# Patient Record
Sex: Male | Born: 1950 | Race: White | Hispanic: No | Marital: Married | Smoking: Never smoker
Health system: Southern US, Community
[De-identification: ages and names within clinical notes are randomized; demographics above are authoritative.]

## PROBLEM LIST (undated history)

## (undated) DIAGNOSIS — Z9089 Acquired absence of other organs: Secondary | ICD-10-CM

## (undated) DIAGNOSIS — G4730 Sleep apnea, unspecified: Secondary | ICD-10-CM

## (undated) DIAGNOSIS — F419 Anxiety disorder, unspecified: Secondary | ICD-10-CM

## (undated) DIAGNOSIS — I10 Essential (primary) hypertension: Secondary | ICD-10-CM

## (undated) DIAGNOSIS — H9190 Unspecified hearing loss, unspecified ear: Secondary | ICD-10-CM

## (undated) DIAGNOSIS — N401 Enlarged prostate with lower urinary tract symptoms: Secondary | ICD-10-CM

## (undated) DIAGNOSIS — N508 Other specified disorders of male genital organs: Secondary | ICD-10-CM

## (undated) DIAGNOSIS — E785 Hyperlipidemia, unspecified: Secondary | ICD-10-CM

## (undated) DIAGNOSIS — M792 Neuralgia and neuritis, unspecified: Secondary | ICD-10-CM

## (undated) DIAGNOSIS — G6181 Chronic inflammatory demyelinating polyneuritis: Secondary | ICD-10-CM

## (undated) DIAGNOSIS — F528 Other sexual dysfunction not due to a substance or known physiological condition: Secondary | ICD-10-CM

## (undated) DIAGNOSIS — E1065 Type 1 diabetes mellitus with hyperglycemia: Secondary | ICD-10-CM

## (undated) DIAGNOSIS — T402X5A Adverse effect of other opioids, initial encounter: Secondary | ICD-10-CM

## (undated) DIAGNOSIS — K5903 Drug induced constipation: Secondary | ICD-10-CM

## (undated) HISTORY — PX: BACK SURGERY: SHX140

## (undated) HISTORY — PX: KNEE SURGERY: SHX244

## (undated) HISTORY — PX: TONSILLECTOMY: SUR1361

## (undated) HISTORY — DX: Other specified disorders of male genital organs: N50.8

## (undated) HISTORY — DX: Hyperlipidemia, unspecified: E78.5

## (undated) HISTORY — DX: Benign prostatic hyperplasia with lower urinary tract symptoms: N40.1

## (undated) HISTORY — DX: Acquired absence of other organs: Z90.89

## (undated) HISTORY — DX: Essential (primary) hypertension: I10

## (undated) HISTORY — DX: Other sexual dysfunction not due to a substance or known physiological condition: F52.8

## (undated) HISTORY — DX: Sleep apnea, unspecified: G47.30

## (undated) HISTORY — DX: Drug induced constipation: K59.03

## (undated) HISTORY — DX: Type 1 diabetes mellitus with hyperglycemia: E10.65

## (undated) HISTORY — PX: EYE SURGERY: SHX253

## (undated) HISTORY — DX: Adverse effect of other opioids, initial encounter: T40.2X5A

## (undated) HISTORY — PX: APPENDECTOMY: SHX54

---

## 2001-09-21 ENCOUNTER — Encounter: Admission: RE | Admit: 2001-09-21 | Discharge: 2001-11-29 | Payer: Self-pay | Admitting: Endocrinology

## 2002-02-11 ENCOUNTER — Encounter: Payer: Self-pay | Admitting: Endocrinology

## 2002-02-11 ENCOUNTER — Encounter: Admission: RE | Admit: 2002-02-11 | Discharge: 2002-02-11 | Payer: Self-pay | Admitting: Endocrinology

## 2004-02-07 ENCOUNTER — Ambulatory Visit: Payer: Self-pay | Admitting: Endocrinology

## 2004-02-14 ENCOUNTER — Ambulatory Visit: Payer: Self-pay | Admitting: Endocrinology

## 2004-03-10 ENCOUNTER — Ambulatory Visit: Payer: Self-pay | Admitting: Endocrinology

## 2004-04-08 ENCOUNTER — Ambulatory Visit: Payer: Self-pay | Admitting: Endocrinology

## 2004-04-14 ENCOUNTER — Ambulatory Visit: Payer: Self-pay | Admitting: Endocrinology

## 2004-07-15 ENCOUNTER — Ambulatory Visit: Payer: Self-pay | Admitting: Endocrinology

## 2004-07-21 ENCOUNTER — Ambulatory Visit: Payer: Self-pay | Admitting: Endocrinology

## 2004-10-21 ENCOUNTER — Ambulatory Visit: Payer: Self-pay | Admitting: Endocrinology

## 2004-10-27 ENCOUNTER — Ambulatory Visit: Payer: Self-pay | Admitting: Endocrinology

## 2004-11-19 ENCOUNTER — Ambulatory Visit: Payer: Self-pay | Admitting: Endocrinology

## 2005-02-04 ENCOUNTER — Ambulatory Visit: Payer: Self-pay | Admitting: Endocrinology

## 2005-02-10 ENCOUNTER — Ambulatory Visit: Payer: Self-pay | Admitting: Endocrinology

## 2005-02-26 ENCOUNTER — Ambulatory Visit: Payer: Self-pay | Admitting: Endocrinology

## 2005-05-01 ENCOUNTER — Ambulatory Visit: Payer: Self-pay | Admitting: Endocrinology

## 2005-05-11 ENCOUNTER — Ambulatory Visit: Payer: Self-pay | Admitting: Endocrinology

## 2005-06-12 ENCOUNTER — Ambulatory Visit: Payer: Self-pay | Admitting: Endocrinology

## 2006-02-01 ENCOUNTER — Ambulatory Visit: Payer: Self-pay | Admitting: Endocrinology

## 2006-02-01 LAB — CONVERTED CEMR LAB: Hgb A1c MFr Bld: 9.4 % — ABNORMAL HIGH (ref 4.6–6.0)

## 2006-02-11 ENCOUNTER — Ambulatory Visit: Payer: Self-pay | Admitting: Endocrinology

## 2006-05-10 ENCOUNTER — Ambulatory Visit: Payer: Self-pay | Admitting: Endocrinology

## 2006-05-10 LAB — CONVERTED CEMR LAB
ALT: 32 units/L (ref 0–40)
AST: 22 units/L (ref 0–37)
Albumin: 4.1 g/dL (ref 3.5–5.2)
Alkaline Phosphatase: 143 units/L — ABNORMAL HIGH (ref 39–117)
BUN: 14 mg/dL (ref 6–23)
Basophils Absolute: 0 10*3/uL (ref 0.0–0.1)
Basophils Relative: 0.3 % (ref 0.0–1.0)
Bilirubin Urine: NEGATIVE
Bilirubin, Direct: 0.2 mg/dL (ref 0.0–0.3)
CO2: 30 meq/L (ref 19–32)
Calcium: 9.5 mg/dL (ref 8.4–10.5)
Chloride: 109 meq/L (ref 96–112)
Cholesterol: 110 mg/dL (ref 0–200)
Creatinine, Ser: 0.8 mg/dL (ref 0.4–1.5)
Creatinine,U: 105.3 mg/dL
Eosinophils Absolute: 0.2 10*3/uL (ref 0.0–0.6)
Eosinophils Relative: 3.4 % (ref 0.0–5.0)
GFR calc Af Amer: 130 mL/min
GFR calc non Af Amer: 107 mL/min
Glucose, Bld: 112 mg/dL — ABNORMAL HIGH (ref 70–99)
HCT: 52.5 % — ABNORMAL HIGH (ref 39.0–52.0)
HDL: 35.7 mg/dL — ABNORMAL LOW (ref >39.0)
Hemoglobin: 18.1 g/dL — ABNORMAL HIGH (ref 13.0–17.0)
Hgb A1c MFr Bld: 9.8 % — ABNORMAL HIGH (ref 4.6–6.0)
Ketones, ur: NEGATIVE mg/dL
LDL Cholesterol: 50 mg/dL (ref 0–99)
Leukocytes, UA: NEGATIVE
Lymphocytes Relative: 26.8 % (ref 12.0–46.0)
MCHC: 34.5 g/dL (ref 30.0–36.0)
MCV: 88.4 fL (ref 78.0–100.0)
Microalb Creat Ratio: 2.8 mg/g (ref 0.0–30.0)
Microalb, Ur: 0.3 mg/dL (ref 0.0–1.9)
Monocytes Absolute: 0.6 10*3/uL (ref 0.2–0.7)
Monocytes Relative: 9 % (ref 3.0–11.0)
Neutro Abs: 4 10*3/uL (ref 1.4–7.7)
Neutrophils Relative %: 60.5 % (ref 43.0–77.0)
Nitrite: NEGATIVE
PSA: 2.3 ng/mL (ref 0.10–4.00)
Platelets: 218 10*3/uL (ref 150–400)
Potassium: 4 meq/L (ref 3.5–5.1)
RBC: 5.94 M/uL — ABNORMAL HIGH (ref 4.22–5.81)
RDW: 11.8 % (ref 11.5–14.6)
Sodium: 145 meq/L (ref 135–145)
Specific Gravity, Urine: 1.025 (ref 1.000–1.03)
TSH: 1.93 microintl units/mL (ref 0.35–5.50)
Total Bilirubin: 0.8 mg/dL (ref 0.3–1.2)
Total CHOL/HDL Ratio: 3.1
Total Protein, Urine: NEGATIVE mg/dL
Total Protein: 7 g/dL (ref 6.0–8.3)
Triglycerides: 124 mg/dL (ref 0–149)
Urine Glucose: 500 mg/dL — AB
Urobilinogen, UA: 0.2 (ref 0.0–1.0)
VLDL: 25 mg/dL (ref 0–40)
WBC: 6.6 10*3/uL (ref 4.5–10.5)
pH: 5.5 (ref 5.0–8.0)

## 2006-05-17 ENCOUNTER — Ambulatory Visit: Payer: Self-pay | Admitting: Endocrinology

## 2006-09-17 ENCOUNTER — Ambulatory Visit: Payer: Self-pay | Admitting: Endocrinology

## 2006-09-20 ENCOUNTER — Ambulatory Visit: Payer: Self-pay | Admitting: Endocrinology

## 2006-12-29 DIAGNOSIS — E1065 Type 1 diabetes mellitus with hyperglycemia: Secondary | ICD-10-CM

## 2006-12-29 DIAGNOSIS — IMO0002 Reserved for concepts with insufficient information to code with codable children: Secondary | ICD-10-CM

## 2006-12-29 HISTORY — DX: Reserved for concepts with insufficient information to code with codable children: IMO0002

## 2006-12-29 HISTORY — DX: Type 1 diabetes mellitus with hyperglycemia: E10.65

## 2006-12-29 LAB — CONVERTED CEMR LAB: Hgb A1c MFr Bld: 9.4 % — ABNORMAL HIGH (ref 4.6–6.0)

## 2007-02-15 ENCOUNTER — Encounter: Payer: Self-pay | Admitting: Endocrinology

## 2007-02-16 ENCOUNTER — Ambulatory Visit: Payer: Self-pay | Admitting: Endocrinology

## 2007-02-16 DIAGNOSIS — E785 Hyperlipidemia, unspecified: Secondary | ICD-10-CM

## 2007-02-16 DIAGNOSIS — N508 Other specified disorders of male genital organs: Secondary | ICD-10-CM | POA: Insufficient documentation

## 2007-02-16 DIAGNOSIS — Z9089 Acquired absence of other organs: Secondary | ICD-10-CM | POA: Insufficient documentation

## 2007-02-16 DIAGNOSIS — F411 Generalized anxiety disorder: Secondary | ICD-10-CM | POA: Insufficient documentation

## 2007-02-16 DIAGNOSIS — M25569 Pain in unspecified knee: Secondary | ICD-10-CM

## 2007-02-16 DIAGNOSIS — G8929 Other chronic pain: Secondary | ICD-10-CM | POA: Insufficient documentation

## 2007-02-16 DIAGNOSIS — I10 Essential (primary) hypertension: Secondary | ICD-10-CM | POA: Insufficient documentation

## 2007-02-16 DIAGNOSIS — F528 Other sexual dysfunction not due to a substance or known physiological condition: Secondary | ICD-10-CM

## 2007-02-16 HISTORY — DX: Other sexual dysfunction not due to a substance or known physiological condition: F52.8

## 2007-02-16 HISTORY — DX: Acquired absence of other organs: Z90.89

## 2007-02-16 HISTORY — DX: Other specified disorders of male genital organs: N50.8

## 2007-02-16 HISTORY — DX: Essential (primary) hypertension: I10

## 2007-02-16 HISTORY — DX: Hyperlipidemia, unspecified: E78.5

## 2007-03-18 ENCOUNTER — Ambulatory Visit: Payer: Self-pay | Admitting: Endocrinology

## 2007-03-20 LAB — CONVERTED CEMR LAB: Hgb A1c MFr Bld: 8.7 % — ABNORMAL HIGH (ref 4.6–6.0)

## 2007-03-21 ENCOUNTER — Encounter: Payer: Self-pay | Admitting: Endocrinology

## 2007-03-28 ENCOUNTER — Ambulatory Visit: Payer: Self-pay | Admitting: Endocrinology

## 2007-05-09 ENCOUNTER — Ambulatory Visit: Payer: Self-pay | Admitting: Endocrinology

## 2007-05-25 ENCOUNTER — Encounter: Payer: Self-pay | Admitting: Endocrinology

## 2007-06-14 ENCOUNTER — Telehealth (INDEPENDENT_AMBULATORY_CARE_PROVIDER_SITE_OTHER): Payer: Self-pay | Admitting: *Deleted

## 2007-06-16 ENCOUNTER — Telehealth: Payer: Self-pay | Admitting: Endocrinology

## 2007-06-24 ENCOUNTER — Ambulatory Visit: Payer: Self-pay | Admitting: Endocrinology

## 2007-06-26 LAB — CONVERTED CEMR LAB
ALT: 32 units/L (ref 0–53)
AST: 24 units/L (ref 0–37)
Albumin: 3.9 g/dL (ref 3.5–5.2)
Alkaline Phosphatase: 136 units/L — ABNORMAL HIGH (ref 39–117)
BUN: 16 mg/dL (ref 6–23)
Bacteria, UA: NEGATIVE
Basophils Absolute: 0 10*3/uL (ref 0.0–0.1)
Basophils Relative: 0.5 % (ref 0.0–1.0)
Bilirubin Urine: NEGATIVE
Bilirubin, Direct: 0.1 mg/dL (ref 0.0–0.3)
CO2: 29 meq/L (ref 19–32)
Calcium: 9 mg/dL (ref 8.4–10.5)
Chloride: 110 meq/L (ref 96–112)
Cholesterol: 105 mg/dL (ref 0–200)
Creatinine, Ser: 0.9 mg/dL (ref 0.4–1.5)
Creatinine,U: 214.8 mg/dL
Crystals: NEGATIVE
Eosinophils Absolute: 0.2 10*3/uL (ref 0.0–0.7)
Eosinophils Relative: 3.6 % (ref 0.0–5.0)
GFR calc Af Amer: 113 mL/min
GFR calc non Af Amer: 93 mL/min
Glucose, Bld: 101 mg/dL — ABNORMAL HIGH (ref 70–99)
HCT: 49.4 % (ref 39.0–52.0)
HDL: 25.9 mg/dL — ABNORMAL LOW (ref >39.0)
Hemoglobin: 16.7 g/dL (ref 13.0–17.0)
Hgb A1c MFr Bld: 8.5 % — ABNORMAL HIGH (ref 4.6–6.0)
Ketones, ur: NEGATIVE mg/dL
LDL Cholesterol: 67 mg/dL (ref 0–99)
Leukocytes, UA: NEGATIVE
Lymphocytes Relative: 26.9 % (ref 12.0–46.0)
MCHC: 33.9 g/dL (ref 30.0–36.0)
MCV: 91.1 fL (ref 78.0–100.0)
Microalb Creat Ratio: 10.7 mg/g (ref 0.0–30.0)
Microalb, Ur: 2.3 mg/dL — ABNORMAL HIGH (ref 0.0–1.9)
Monocytes Absolute: 0.5 10*3/uL (ref 0.1–1.0)
Monocytes Relative: 9.4 % (ref 3.0–12.0)
Neutro Abs: 3.5 10*3/uL (ref 1.4–7.7)
Neutrophils Relative %: 59.6 % (ref 43.0–77.0)
Nitrite: NEGATIVE
PSA: 2.33 ng/mL (ref 0.10–4.00)
Platelets: 183 10*3/uL (ref 150–400)
Potassium: 4.1 meq/L (ref 3.5–5.1)
RBC: 5.42 M/uL (ref 4.22–5.81)
RDW: 12.1 % (ref 11.5–14.6)
Sodium: 142 meq/L (ref 135–145)
Specific Gravity, Urine: 1.025 (ref 1.000–1.03)
Squamous Epithelial / HPF: NEGATIVE /lpf
TSH: 1.07 microintl units/mL (ref 0.35–5.50)
Total Bilirubin: 0.8 mg/dL (ref 0.3–1.2)
Total CHOL/HDL Ratio: 4.1
Total Protein, Urine: NEGATIVE mg/dL
Total Protein: 6.9 g/dL (ref 6.0–8.3)
Triglycerides: 60 mg/dL (ref 0–149)
Urine Glucose: 250 mg/dL — AB
Urobilinogen, UA: 1 (ref 0.0–1.0)
VLDL: 12 mg/dL (ref 0–40)
WBC, UA: NONE SEEN cells/hpf
WBC: 5.8 10*3/uL (ref 4.5–10.5)
pH: 5.5 (ref 5.0–8.0)

## 2007-07-04 ENCOUNTER — Ambulatory Visit: Payer: Self-pay | Admitting: Endocrinology

## 2007-07-04 DIAGNOSIS — M25529 Pain in unspecified elbow: Secondary | ICD-10-CM | POA: Insufficient documentation

## 2007-07-08 ENCOUNTER — Encounter: Admission: RE | Admit: 2007-07-08 | Discharge: 2007-07-08 | Payer: Self-pay | Admitting: Orthopedic Surgery

## 2007-07-18 ENCOUNTER — Telehealth: Payer: Self-pay | Admitting: Endocrinology

## 2007-07-20 ENCOUNTER — Telehealth: Payer: Self-pay | Admitting: Endocrinology

## 2007-07-22 ENCOUNTER — Telehealth (INDEPENDENT_AMBULATORY_CARE_PROVIDER_SITE_OTHER): Payer: Self-pay | Admitting: *Deleted

## 2007-07-22 ENCOUNTER — Telehealth: Payer: Self-pay | Admitting: Endocrinology

## 2007-07-22 ENCOUNTER — Encounter: Payer: Self-pay | Admitting: Internal Medicine

## 2007-11-22 ENCOUNTER — Encounter: Payer: Self-pay | Admitting: Endocrinology

## 2008-02-13 ENCOUNTER — Ambulatory Visit: Payer: Self-pay | Admitting: Endocrinology

## 2008-02-13 LAB — CONVERTED CEMR LAB
AST: 34 units/L (ref 0–37)
Basophils Absolute: 0 10*3/uL (ref 0.0–0.1)
Bilirubin, Direct: 0.2 mg/dL (ref 0.0–0.3)
CO2: 29 meq/L (ref 19–32)
Chloride: 108 meq/L (ref 96–112)
Cholesterol: 106 mg/dL (ref 0–200)
Creatinine, Ser: 0.9 mg/dL (ref 0.4–1.5)
Creatinine,U: 132 mg/dL
Eosinophils Absolute: 0.2 10*3/uL (ref 0.0–0.7)
GFR calc non Af Amer: 93 mL/min
Hgb A1c MFr Bld: 8.6 % — ABNORMAL HIGH (ref 4.6–6.0)
Ketones, ur: NEGATIVE mg/dL
LDL Cholesterol: 61 mg/dL (ref 0–99)
Leukocytes, UA: NEGATIVE
Lymphocytes Relative: 23.3 % (ref 12.0–46.0)
MCHC: 34.4 g/dL (ref 30.0–36.0)
MCV: 89.9 fL (ref 78.0–100.0)
Mucus, UA: NEGATIVE
Neutrophils Relative %: 65.1 % (ref 43.0–77.0)
PSA: 2.53 ng/mL (ref 0.10–4.00)
Platelets: 186 10*3/uL (ref 150–400)
Potassium: 4.2 meq/L (ref 3.5–5.1)
RBC: 5.43 M/uL (ref 4.22–5.81)
Sodium: 142 meq/L (ref 135–145)
Specific Gravity, Urine: 1.015 (ref 1.000–1.03)
Squamous Epithelial / LPF: NEGATIVE /lpf
TSH: 1.75 microintl units/mL (ref 0.35–5.50)
Total Bilirubin: 0.9 mg/dL (ref 0.3–1.2)
Total CHOL/HDL Ratio: 3
Triglycerides: 49 mg/dL (ref 0–149)
Urine Glucose: NEGATIVE mg/dL
Urobilinogen, UA: 0.2 (ref 0.0–1.0)
VLDL: 10 mg/dL (ref 0–40)
WBC, UA: NONE SEEN cells/hpf

## 2008-02-21 ENCOUNTER — Ambulatory Visit: Payer: Self-pay | Admitting: Endocrinology

## 2008-03-19 ENCOUNTER — Encounter: Payer: Self-pay | Admitting: Endocrinology

## 2008-03-24 ENCOUNTER — Encounter: Payer: Self-pay | Admitting: Endocrinology

## 2008-03-27 ENCOUNTER — Ambulatory Visit: Payer: Self-pay | Admitting: Endocrinology

## 2008-04-02 ENCOUNTER — Telehealth: Payer: Self-pay | Admitting: Gastroenterology

## 2008-04-04 ENCOUNTER — Encounter: Payer: Self-pay | Admitting: Gastroenterology

## 2008-05-11 ENCOUNTER — Ambulatory Visit: Payer: Self-pay | Admitting: Gastroenterology

## 2008-05-11 ENCOUNTER — Telehealth: Payer: Self-pay | Admitting: Gastroenterology

## 2008-05-15 ENCOUNTER — Encounter: Payer: Self-pay | Admitting: Gastroenterology

## 2008-05-25 ENCOUNTER — Telehealth: Payer: Self-pay | Admitting: Gastroenterology

## 2008-05-25 DIAGNOSIS — K921 Melena: Secondary | ICD-10-CM | POA: Insufficient documentation

## 2008-07-25 ENCOUNTER — Ambulatory Visit: Payer: Self-pay | Admitting: Gastroenterology

## 2008-08-22 ENCOUNTER — Ambulatory Visit: Payer: Self-pay | Admitting: Endocrinology

## 2008-08-22 LAB — CONVERTED CEMR LAB: Hgb A1c MFr Bld: 8.8 % — ABNORMAL HIGH (ref 4.6–6.5)

## 2008-09-24 ENCOUNTER — Telehealth: Payer: Self-pay | Admitting: Endocrinology

## 2008-10-23 ENCOUNTER — Encounter: Payer: Self-pay | Admitting: Endocrinology

## 2008-11-26 ENCOUNTER — Ambulatory Visit: Payer: Self-pay | Admitting: Endocrinology

## 2008-11-28 ENCOUNTER — Telehealth: Payer: Self-pay | Admitting: Endocrinology

## 2008-11-30 LAB — CONVERTED CEMR LAB: Microalb Creat Ratio: 7.9 mg/g (ref 0.0–30.0)

## 2009-01-18 ENCOUNTER — Telehealth (INDEPENDENT_AMBULATORY_CARE_PROVIDER_SITE_OTHER): Payer: Self-pay | Admitting: *Deleted

## 2009-01-19 ENCOUNTER — Encounter: Payer: Self-pay | Admitting: Endocrinology

## 2009-02-04 ENCOUNTER — Telehealth: Payer: Self-pay | Admitting: Endocrinology

## 2009-02-05 ENCOUNTER — Encounter: Payer: Self-pay | Admitting: Endocrinology

## 2009-03-15 ENCOUNTER — Telehealth (INDEPENDENT_AMBULATORY_CARE_PROVIDER_SITE_OTHER): Payer: Self-pay | Admitting: *Deleted

## 2009-04-01 ENCOUNTER — Encounter: Payer: Self-pay | Admitting: Endocrinology

## 2009-04-15 ENCOUNTER — Encounter: Payer: Self-pay | Admitting: Endocrinology

## 2009-04-22 ENCOUNTER — Encounter: Admission: RE | Admit: 2009-04-22 | Discharge: 2009-04-22 | Payer: Self-pay | Admitting: Orthopedic Surgery

## 2009-04-25 ENCOUNTER — Ambulatory Visit: Payer: Self-pay | Admitting: Endocrinology

## 2009-07-04 ENCOUNTER — Encounter (INDEPENDENT_AMBULATORY_CARE_PROVIDER_SITE_OTHER): Payer: Self-pay | Admitting: *Deleted

## 2009-10-25 ENCOUNTER — Encounter: Payer: Self-pay | Admitting: Endocrinology

## 2009-11-11 ENCOUNTER — Telehealth: Payer: Self-pay | Admitting: Endocrinology

## 2009-12-11 ENCOUNTER — Ambulatory Visit: Payer: Self-pay | Admitting: Endocrinology

## 2009-12-11 ENCOUNTER — Encounter (INDEPENDENT_AMBULATORY_CARE_PROVIDER_SITE_OTHER): Payer: Self-pay | Admitting: *Deleted

## 2009-12-11 ENCOUNTER — Encounter: Payer: Self-pay | Admitting: Endocrinology

## 2009-12-11 LAB — CONVERTED CEMR LAB
AST: 26 units/L (ref 0–37)
Alkaline Phosphatase: 114 units/L (ref 39–117)
BUN: 13 mg/dL (ref 6–23)
Basophils Absolute: 0 10*3/uL (ref 0.0–0.1)
Bilirubin, Direct: 0.2 mg/dL (ref 0.0–0.3)
Calcium: 9.2 mg/dL (ref 8.4–10.5)
Cholesterol: 108 mg/dL (ref 0–200)
Creatinine,U: 59 mg/dL
GFR calc non Af Amer: 101.37 mL/min (ref >60)
Glucose, Bld: 92 mg/dL (ref 70–99)
HDL: 30.5 mg/dL — ABNORMAL LOW (ref >39.00)
LDL Cholesterol: 64 mg/dL (ref 0–99)
Lymphocytes Relative: 31.1 % (ref 12.0–46.0)
Microalb, Ur: 1.1 mg/dL (ref 0.0–1.9)
Monocytes Relative: 9.9 % (ref 3.0–12.0)
Platelets: 188 10*3/uL (ref 150.0–400.0)
RDW: 12.9 % (ref 11.5–14.6)
Sodium: 138 meq/L (ref 135–145)
Total Bilirubin: 0.9 mg/dL (ref 0.3–1.2)
Urine Glucose: 500 mg/dL
Urobilinogen, UA: 0.2 (ref 0.0–1.0)
VLDL: 13.6 mg/dL (ref 0.0–40.0)

## 2009-12-12 ENCOUNTER — Encounter: Payer: Self-pay | Admitting: Endocrinology

## 2009-12-22 ENCOUNTER — Encounter: Payer: Self-pay | Admitting: Endocrinology

## 2009-12-29 ENCOUNTER — Encounter: Payer: Self-pay | Admitting: Endocrinology

## 2010-01-18 ENCOUNTER — Encounter: Payer: Self-pay | Admitting: Endocrinology

## 2010-01-22 ENCOUNTER — Encounter (INDEPENDENT_AMBULATORY_CARE_PROVIDER_SITE_OTHER): Payer: Self-pay | Admitting: *Deleted

## 2010-01-22 ENCOUNTER — Telehealth (INDEPENDENT_AMBULATORY_CARE_PROVIDER_SITE_OTHER): Payer: Self-pay | Admitting: *Deleted

## 2010-01-27 ENCOUNTER — Ambulatory Visit: Payer: Self-pay | Admitting: Gastroenterology

## 2010-01-27 ENCOUNTER — Encounter: Payer: Self-pay | Admitting: Endocrinology

## 2010-01-27 ENCOUNTER — Encounter (INDEPENDENT_AMBULATORY_CARE_PROVIDER_SITE_OTHER): Payer: Self-pay | Admitting: *Deleted

## 2010-02-03 ENCOUNTER — Ambulatory Visit: Payer: Self-pay | Admitting: Endocrinology

## 2010-02-03 DIAGNOSIS — N400 Enlarged prostate without lower urinary tract symptoms: Secondary | ICD-10-CM | POA: Insufficient documentation

## 2010-02-03 DIAGNOSIS — N138 Other obstructive and reflux uropathy: Secondary | ICD-10-CM

## 2010-02-03 DIAGNOSIS — N401 Enlarged prostate with lower urinary tract symptoms: Secondary | ICD-10-CM

## 2010-02-03 HISTORY — DX: Other obstructive and reflux uropathy: N13.8

## 2010-02-03 HISTORY — DX: Benign prostatic hyperplasia with lower urinary tract symptoms: N40.1

## 2010-02-05 ENCOUNTER — Ambulatory Visit: Payer: Self-pay | Admitting: Gastroenterology

## 2010-02-05 HISTORY — PX: COLONOSCOPY: SHX174

## 2010-02-06 ENCOUNTER — Encounter (INDEPENDENT_AMBULATORY_CARE_PROVIDER_SITE_OTHER): Payer: Self-pay | Admitting: *Deleted

## 2010-02-14 ENCOUNTER — Encounter: Payer: Self-pay | Admitting: Endocrinology

## 2010-03-20 NOTE — Letter (Signed)
Summary: Diabetic Instructions  [facility]  [address] [place], [place] [address]   Phone: [phone]  Fax: [phone]    JONTAVIOUS COMMONS 08-23-1950 MRN: [id number]           _  _   INSULIN PUMP MEDICATION INSTRUCTIONS  We will contact the physician managing your diabetic care for written dosage instructions for the day before your procedure and the day of your procedure.  Once we have received the instructions, we will contact you.

## 2010-03-20 NOTE — Assessment & Plan Note (Signed)
Summary: CPX/ NEEDS TO DO LABS SAME DAY /NWS  #   Vital Signs:  Patient profile:   60 year old male Height:      72 inches (182.88 cm) Weight:      227.50 pounds (103.41 kg) BMI:     30.97 O2 Sat:      97 % on Room air Temp:     97.6 degrees F (36.44 degrees C) oral Pulse rate:   72 / minute BP sitting:   122 / 74  (left arm) Cuff size:   large  Vitals Entered By: Brenton Grills MA (December 11, 2009 8:05 AM)  O2 Flow:  Room air CC: Physical/refill on meds/aj Is Patient Diabetic? Yes   CC:  Physical/refill on meds/aj.  History of Present Illness: here for regular wellness examination.  He's feeling pretty well in general, and does not drink or smoke.   Current Medications (verified): 1)  Buspirone Hcl 15 Mg Tabs (Buspirone Hcl) .... Take 1 By Mouth Two Times A Day Qd 2)  Humalog 100 Unit/ml Soln (Insulin Lispro (Human)) .... For Use in Pump, 80 Units A Day 3)  Zetia 10 Mg Tabs (Ezetimibe) .... Take 1 By Mouth Qd 4)  Crestor 40 Mg  Tabs (Rosuvastatin Calcium) .... Take 1 By Mouth Qd 5)  Onetouch Ultra Test   Strp (Glucose Blood) .... 5/day, Lancets Also 6)  Sof-Sensor   Misc (Insulin Infusion Pump Supplies) .... Change 2/week 7)  Flomax 0.4 Mg  Cp24 (Tamsulosin Hcl) .... Take 1 By Mouth Qd 8)  Zestoretic 10-12.5 Mg  Tabs (Lisinopril-Hydrochlorothiazide) .Marland Kitchen.. 1 Qd 9)  Cialis 20 Mg Tabs (Tadalafil) .... As Needed Use 10)  Accu-Chek Multiclix Lancets  Misc (Lancets) .... 5/day 250.01  Allergies (verified): No Known Drug Allergies  Family History: Reviewed history from 07/04/2007 and no changes required. no cancer.  Social History: Reviewed history from 07/04/2007 and no changes required. married works [employer].  Review of Systems  The patient denies fever, weight loss, weight gain, vision loss, decreased hearing, chest pain, syncope, dyspnea on exertion, prolonged cough, headaches, abdominal pain, melena, hematochezia, severe indigestion/heartburn, hematuria,  suspicious skin lesions, and depression.    Physical Exam  General:  normal appearance.   Head:  head: no deformity eyes: no periorbital swelling, no proptosis external nose and ears are normal mouth: no lesion seen Neck:  Supple without thyroid enlargement or tenderness.  Heart:  Regular rate and rhythm without murmurs or gallops noted. Normal S1,S2.   Abdomen:  abdomen is soft, nontender.  no hepatosplenomegaly.   not distended.  no hernia  Rectal:  normal external and internal exam.  heme neg  Prostate:  Normal size prostate without masses or tenderness.  Msk:  muscle bulk and strength are grossly normal.  no obvious joint swelling.  gait is normal and steady  Extremities:  no deformity.  no ulcer on the feet.  feet are of normal color and temp.  no edema ther are healed abrasions on the legs. mycotic toenails.   Neurologic:  cn 2-12 grossly intact.   readily moves all 4's.    Skin:  normal texture and temp.  no rash.  not diaphoretic  Cervical Nodes:  No significant adenopathy.  Psych:  Alert and cooperative; normal mood and affect; normal attention span and concentration.   Additional Exam:  SEPARATE EVALUATION FOLLOWS--EACH PROBLEM HERE IS NEW, NOT RESPONDING TO TREATMENT, OR POSES SIGNIFICANT RISK TO THE PATIENT'S HEALTH: HISTORY OF THE PRESENT ILLNESS: pt states he  feels well in general.  i have reviewed cgm report from the internet.  it is extremely variable on weekdays (when he travels), but is well-controlled on weekends.  cgm just stopped working. PAST MEDICAL HISTORY reviewed and up to date today REVIEW OF SYSTEMS: PHYSICAL EXAMINATION: dorsalis pedis intact bilat.  no carotid bruit clear to auscultation.  no respiratory distress sensation is intact to touch on the feet LAB/XRAY RESULTS: a1c is noted IMPRESSION: dm, needs increased rx PLAN: see instruction sheet   Impression & Recommendations:  Problem # 1:  ROUTINE GENERAL MEDICAL EXAM@HEALTH  CARE FACL  (ICD-V70.0) (update:  we discussed code status.  pt requests full code, but would not want to be started or maintained on artificial life-support measures if there was not a reasonable chance of recovery)  Other Orders: EKG w/ Interpretation (93000) TLB-Lipid Panel (80061-LIPID) TLB-BMP (Basic Metabolic Panel-BMET) (80048-METABOL) TLB-CBC Platelet - w/Differential (85025-CBCD) TLB-Hepatic/Liver Function Pnl (80076-HEPATIC) TLB-TSH (Thyroid Stimulating Hormone) (84443-TSH) TLB-A1C / Hgb A1C (Glycohemoglobin) (83036-A1C) TLB-PSA (Prostate Specific Antigen) (84153-PSA) TLB-Microalbumin/Creat Ratio, Urine (82043-MALB) TLB-Udip w/ Micro (81001-URINE) Est. Patient Level III (60630) Est. Patient 40-64 years ([address]010)  Patient Instructions: 1)  pending the test results, please continue the same medications for now. 2)  continue correction bolus (which some people call "sensitivity," or "insulin sensitivity ratio," or just "isr") of 1 unit for each 50 by which your glucose exceeds 100. 3)  tests are being ordered for you today.  a few days after the test(s), please call [phone] to hear your test results.   4)  Please schedule an appointment in 3 months, with labs day of visit. 5)  continue basal rate of 1.5 units/hr, except increase to 2 units/hr, 3 am-6 am. 6)  continue bolus of 1 unit/4 grams carbohydrate, except add 2 units to breakfast, subtract 8 from lunch, and add 8 to supper. 7)  i'll do the form for a replacement monitor. 8)  good diet and exercise habits significanly improve the control of your diabetes.  please let me know if you wish to be referred to a dietician.  high blood sugar is very risky to your health.  you should see an eye doctor every year. 9)  controlling your blood pressure and cholesterol drastically reduces the damage diabetes does to your body.  this also applies to quitting smoking.  please discuss these with your doctor.  you should take an aspirin every day, unless you  have been advised by a doctor not to. 10)  please consider these measures for your health:  minimize alcohol.  do not use tobacco products.  have a colonoscopy at least every 10 years from age 71.  keep firearms safely stored.  always use seat belts.  have working smoke alarms in your home.  see an eye doctor and dentist regularly.  never drive under the influence of alcohol or drugs (including prescription drugs).  those with fair skin should take precautions against the sun. 11)  please let me know what your wishes would be, if artificial life support measures should become necessary.  it is critically important to prevent falling down (keep floor areas well-lit, dry, and free of loose objects) Prescriptions: ACCU-CHEK MULTICLIX LANCETS  MISC (LANCETS) 5/day 250.01  #450 x 3   Entered and Authorized by:   Minus Breeding MD   Signed by:   Minus Breeding MD on 12/11/2009   Method used:   Print then Give to Patient   RxID:   [id number] CIALIS 20 MG  TABS (TADALAFIL) as needed use  #12 x 3   Entered and Authorized by:   Minus Breeding MD   Signed by:   Minus Breeding MD on 12/11/2009   Method used:   Print then Give to Patient   RxID:   [id number] ZESTORETIC 10-12.5 MG  TABS (LISINOPRIL-HYDROCHLOROTHIAZIDE) 1 qd  #90 x 3   Entered and Authorized by:   Minus Breeding MD   Signed by:   Minus Breeding MD on 12/11/2009   Method used:   Print then Give to Patient   RxID:   [id number] FLOMAX 0.4 MG  CP24 (TAMSULOSIN HCL) TAKE 1 by mouth QD  #90 x 3   Entered and Authorized by:   Minus Breeding MD   Signed by:   Minus Breeding MD on 12/11/2009   Method used:   Print then Give to Patient   RxID:   [id number] SOF-SENSOR   MISC (INSULIN INFUSION PUMP SUPPLIES) change 2/week  #25 x 3   Entered and Authorized by:   Minus Breeding MD   Signed by:   Minus Breeding MD on 12/11/2009   Method used:   Print then Give to Patient   RxID:   [id number] ONETOUCH ULTRA TEST   STRP  (GLUCOSE BLOOD) 5/day, lancets also  #500 x 3   Entered and Authorized by:   Minus Breeding MD   Signed by:   Minus Breeding MD on 12/11/2009   Method used:   Print then Give to Patient   RxID:   [id number] CRESTOR 40 MG  TABS (ROSUVASTATIN CALCIUM) TAKE 1 by mouth QD  #90 x 3   Entered and Authorized by:   Minus Breeding MD   Signed by:   Minus Breeding MD on 12/11/2009   Method used:   Print then Give to Patient   RxID:   [id number] ZETIA 10 MG TABS (EZETIMIBE) take 1 by mouth qd  #90 x 3   Entered and Authorized by:   Minus Breeding MD   Signed by:   Minus Breeding MD on 12/11/2009   Method used:   Print then Give to Patient   RxID:   [id number] HUMALOG 100 UNIT/ML SOLN (INSULIN LISPRO (HUMAN)) for use in pump, 80 units a day  #8 vials x 3   Entered and Authorized by:   Minus Breeding MD   Signed by:   Minus Breeding MD on 12/11/2009   Method used:   Print then Give to Patient   RxID:   [id number] BUSPIRONE HCL 15 MG TABS (BUSPIRONE HCL) TAKE 1 by mouth two times a day QD  #180 x 3   Entered and Authorized by:   Minus Breeding MD   Signed by:   Minus Breeding MD on 12/11/2009   Method used:   Print then Give to Patient   RxID:   [id number] ACCU-CHEK MULTICLIX LANCETS  MISC (LANCETS) 5/day 250.01  #450 x 3   Entered and Authorized by:   Minus Breeding MD   Signed by:   Minus Breeding MD on 12/11/2009   Method used:   Print then Give to Patient   RxID:   [id number] SOF-SENSOR   MISC (INSULIN INFUSION PUMP SUPPLIES) change 2/week  #25 x 3   Entered and Authorized by:   Minus Breeding MD   Signed by:   Minus Breeding MD on 12/11/2009   Method  used:   Print then Give to Patient   RxID:   [id number] FLOMAX 0.4 MG  CP24 (TAMSULOSIN HCL) TAKE 1 by mouth QD  #90 x 3   Entered and Authorized by:   Minus Breeding MD   Signed by:   Minus Breeding MD on 12/11/2009   Method used:   Print then Give to Patient   RxID:   [id number] ZESTORETIC  10-12.5 MG  TABS (LISINOPRIL-HYDROCHLOROTHIAZIDE) 1 qd  #90 x 3   Entered and Authorized by:   Minus Breeding MD   Signed by:   Minus Breeding MD on 12/11/2009   Method used:   Print then Give to Patient   RxID:   [id number] CIALIS 20 MG TABS (TADALAFIL) as needed use  #12 x 3   Entered and Authorized by:   Minus Breeding MD   Signed by:   Minus Breeding MD on 12/11/2009   Method used:   Print then Give to Patient   RxID:   [id number] ONETOUCH ULTRA TEST   STRP (GLUCOSE BLOOD) 5/day, lancets also  #500 x 3   Entered and Authorized by:   Minus Breeding MD   Signed by:   Minus Breeding MD on 12/11/2009   Method used:   Print then Give to Patient   RxID:   [id number] CRESTOR 40 MG  TABS (ROSUVASTATIN CALCIUM) TAKE 1 by mouth QD  #90 x 3   Entered and Authorized by:   Minus Breeding MD   Signed by:   Minus Breeding MD on 12/11/2009   Method used:   Print then Give to Patient   RxID:   [id number] ZETIA 10 MG TABS (EZETIMIBE) take 1 by mouth qd  #90 x 3   Entered and Authorized by:   Minus Breeding MD   Signed by:   Minus Breeding MD on 12/11/2009   Method used:   Print then Give to Patient   RxID:   [id number] HUMALOG 100 UNIT/ML SOLN (INSULIN LISPRO (HUMAN)) for use in pump, 80 units a day  #8 vials x 3   Entered and Authorized by:   Minus Breeding MD   Signed by:   Minus Breeding MD on 12/11/2009   Method used:   Print then Give to Patient   RxID:   [id number] BUSPIRONE HCL 15 MG TABS (BUSPIRONE HCL) TAKE 1 by mouth two times a day QD  #180 x 3   Entered and Authorized by:   Minus Breeding MD   Signed by:   Minus Breeding MD on 12/11/2009   Method used:   Print then Give to Patient   RxID:   [id number]    Orders Added: 1)  EKG w/ Interpretation [93000] 2)  TLB-Lipid Panel [80061-LIPID] 3)  TLB-BMP (Basic Metabolic Panel-BMET) [80048-METABOL] 4)  TLB-CBC Platelet - w/Differential [85025-CBCD] 5)  TLB-Hepatic/Liver Function Pnl  [80076-HEPATIC] 6)  TLB-TSH (Thyroid Stimulating Hormone) [84443-TSH] 7)  TLB-A1C / Hgb A1C (Glycohemoglobin) [83036-A1C] 8)  TLB-PSA (Prostate Specific Antigen) [84153-PSA] 9)  TLB-Microalbumin/Creat Ratio, Urine [82043-MALB] 10)  TLB-Udip w/ Micro [81001-URINE] 11)  Est. Patient Level III [09381] 12)  Est. Patient 40-64 years [82993]

## 2010-03-20 NOTE — Progress Notes (Signed)
Summary: Need for double prep  Phone Note Call from Patient   Summary of Call: Dr. Jarold Motto,  pt had colon 07/25/2008 with poor prep.  The procedure report says that pt will need 2 day double prep.  Could you please advise as to what you would  like pt to use? Initial call taken by: Ezra Sites RN,  January 22, 2010 11:49 AM  Follow-up for Phone Call        BOTTLE OF MAGNESIUM CITRARTE  NIGHT BEFORE START OF 24H CLEAR LIQUIDS AND STANDARD SPLIT DOSE MOVIE PREP... Follow-up by: Mardella Layman MD [facility],  January 22, 2010 11:53 AM  Additional Follow-up for Phone Call Additional follow up Details #1::        Pt will come for [facility] 01/27/2010.  Will be given instructions for bottle of Magnesium Citrate night before start of 24 hour clear liquids and standard split does of MoviPrep. Additional Follow-up by: Ezra Sites RN,  January 22, 2010 12:00 PM

## 2010-03-20 NOTE — Letter (Signed)
Summary: [facility]   Imported By: Sherian Rein 02/26/2010 11:36:37  _____________________________________________________________________  External Attachment:    Type:   Image     Comment:   External Document

## 2010-03-20 NOTE — Progress Notes (Signed)
Summary: CPX due  Phone Note Outgoing Call Call back at [phone]   Call placed by: Brenton Grills MA,  November 11, 2009 4:00 PM Call placed to: Patient Summary of Call: Per MD, pt is due for a CPX. Pt states that he will callback on 9/27  to schedule an appointment.      Brenton Grills MA  November 11, 2009 4:01 PM

## 2010-03-20 NOTE — Letter (Signed)
Summary: Pre Visit Letter Revised  [facility]  [address] [place], [place] [address]   Phone: [phone]  Fax: [phone]        12/11/2009 MRN: [id number] Kenneth Pace [address] [place], [place]  [address]             Procedure Date:  02-05-10   Welcome to the Gastroenterology Division at [facility].    You are scheduled to see a nurse for your pre-procedure visit on 01-27-10 at 8:00a.m. on the 3rd floor at [facility], [address]  We ask that you try to arrive at our office 15 minutes prior to your appointment time to allow for check-in.  Please take a minute to review the attached form.  If you answer "Yes" to one or more of the questions on the first page, we ask that you call the person listed at your earliest opportunity.  If you answer "No" to all of the questions, please complete the rest of the form and bring it to your appointment.    Your nurse visit will consist of discussing your medical and surgical history, your immediate family medical history, and your medications.   If you are unable to list all of your medications on the form, please bring the medication bottles to your appointment and we will list them.  We will need to be aware of both prescribed and over the counter drugs.  We will need to know exact dosage information as well.    Please be prepared to read and sign documents such as consent forms, a financial agreement, and acknowledgement forms.  If necessary, and with your consent, a friend or relative is welcome to sit-in on the nurse visit with you.  Please bring your insurance card so that we may make a copy of it.  If your insurance requires a referral to see a specialist, please bring your referral form from your primary care physician.  No co-pay is required for this nurse visit.     If you cannot keep your appointment, please call [phone] to cancel or reschedule prior to your appointment date.  This  allows [place] the opportunity to schedule an appointment for another patient in need of care.    Thank you for choosing  Gastroenterology for your medical needs.  We appreciate the opportunity to care for you.  Please visit [place] at our website  to learn more about our practice.  Sincerely, The Gastroenterology Division

## 2010-03-20 NOTE — Letter (Signed)
Summary: Insulin pump letter-Colonoscopy  [facility]  [address] [place], [place] [address]   Phone: [phone]  Fax: [phone]      Date: January 27, 2010  Re: Kenneth Pace DOB: [birth year] MRN: [id number]     Dear Dr. Everardo All   Dr. Jarold Motto has scheduled the above patient for a colonoscopy at 9:00am on 02/05/2010.  Our records show that he is on insulin therapy via an insulin pump.  Our colonoscopy prep protocol requires that:   the patient must be on a clear liquid diet the entire day prior to the procedure date as well as the morning of the procedure   the patient must be NPO for 2 hours prior to the procedure    the patient must consume a PEG 3350 solution to prepare for the procedure.  Please advise [place] of any adjustments that need to be made to the patient's insulin pump therapy prior to the above procedure date.    Please route back this completed form to me.  If you have any question, please call me at [phone].  Thank you for your help with this matter.  Sincerely,   Harlow Mares, CMA [facility])  Physician Recommendation:  ________________________________________________  ________________________________________________________________________  ________________________________________________________________________  ________________________________________________________________________  Appended Document: Insulin pump letter-Colonoscopy Dr. Everardo All faxed fax instruction, I have sent a copy down to be scanned and also placed a copy in the [facility] chart. Patient aware of instructions.

## 2010-03-20 NOTE — Letter (Signed)
Summary: Glucose Monitor Readings/Metronic  Glucose Monitor Readings/Metronic   Imported By: Sherian Rein 04/26/2009 11:24:47  _____________________________________________________________________  External Attachment:    Type:   Image     Comment:   External Document

## 2010-03-20 NOTE — Miscellaneous (Signed)
  Clinical Lists Changes  Medications: Removed medication of CIALIS 20 MG TABS (TADALAFIL) as needed use  Appended Document:  pt informed to d/c Cialis/AJ

## 2010-03-20 NOTE — Letter (Signed)
Summary: [facility] Instructions  [facility]  [address] [place], [place] [address]   Phone: [phone]  Fax: [phone]       Kenneth Pace    06/21/1950    MRN: [id number]        Procedure Day Dorna Bloom:  Wednesday  02/05/2010     Arrival Time:  8:00 am      Procedure Time:  9:00 am     Location of Procedure:                    _x _  [facility] (4th Floor)                         PREPARATION FOR COLONOSCOPY WITH MOVIPREP   Starting 5 days prior to your procedure Friday 12/16 do not eat nuts, seeds, popcorn, corn, beans, peas,  salads, or any raw vegetables.  Do not take any fiber supplements (e.g. Metamucil, Citrucel, and Benefiber).   2 DAYS BEFORE PROCEDURE      DATE:  MONDAY 12/19  DRINK 1 BOTTLE OF MAGNESIUM CITRATE AT 6:00 PM- YOU CAN PURCHASE AT PHARMACY   THE DAY BEFORE YOUR PROCEDURE         DATE: Tuesday 12/20  1.  Drink clear liquids the entire day-NO SOLID FOOD  2.  Do not drink anything colored red or purple.  Avoid juices with pulp.  No orange juice.  3.  Drink at least 64 oz. (8 glasses) of fluid/clear liquids during the day to prevent dehydration and help the prep work efficiently.  CLEAR LIQUIDS INCLUDE: Water Jello Ice Popsicles Tea (sugar ok, no milk/cream) Powdered fruit flavored drinks Coffee (sugar ok, no milk/cream) Gatorade Juice: apple, white grape, white cranberry  Lemonade Clear bullion, consomm, broth Carbonated beverages (any kind) Strained chicken noodle soup Hard Candy                             4.  In the morning, mix first dose of MoviPrep solution:    Empty 1 Pouch A and 1 Pouch B into the disposable container    Add lukewarm drinking water to the top line of the container. Mix to dissolve    Refrigerate (mixed solution should be used within 24 hrs)  5.  Begin drinking the prep at 5:00 p.m. The MoviPrep container is divided by 4 marks.   Every 15 minutes drink the solution down to  the next mark (approximately 8 oz) until the full liter is complete.   6.  Follow completed prep with 16 oz of clear liquid of your choice (Nothing red or purple).  Continue to drink clear liquids until bedtime.  7.  Before going to bed, mix second dose of MoviPrep solution:    Empty 1 Pouch A and 1 Pouch B into the disposable container    Add lukewarm drinking water to the top line of the container. Mix to dissolve    Refrigerate  THE DAY OF YOUR PROCEDURE      DATE: [facility] 12/21  Beginning at 4:00 a.m. (5 hours before procedure):         1. Every 15 minutes, drink the solution down to the next mark (approx 8 oz) until the full liter is complete.  2. Follow completed prep with 16 oz. of clear liquid of your choice.    3. You may drink clear liquids until  7:00 am (2 HOURS BEFORE PROCEDURE).   MEDICATION INSTRUCTIONS  Unless otherwise instructed, you should take regular prescription medications with a small sip of water   as early as possible the morning of your procedure.  Diabetic patients - see separate instructions.  Additional medication instructions: Do not take Lisinopril/HCTZ day of procedure.         OTHER INSTRUCTIONS  You will need a responsible adult at least 58 years of age to accompany you and drive you home.   This person must remain in the waiting room during your procedure.  Wear loose fitting clothing that is easily removed.  Leave jewelry and other valuables at home.  However, you may wish to bring a book to read or  an iPod/MP3 player to listen to music as you wait for your procedure to start.  Remove all body piercing jewelry and leave at home.  Total time from sign-in until discharge is approximately 2-3 hours.  You should go home directly after your procedure and rest.  You can resume normal activities the  day after your procedure.  The day of your procedure you should not:   Drive   Make legal decisions   Operate machinery    Drink alcohol   Return to work  You will receive specific instructions about eating, activities and medications before you leave.    The above instructions have been reviewed and explained to me by   Ezra Sites RN  January 27, 2010 8:32 AM     I fully understand and can verbalize these instructions _____________________________ Date _________

## 2010-03-20 NOTE — Assessment & Plan Note (Signed)
Summary: F/U APPT/#/CD   Vital Signs:  Patient profile:   60 year old male Height:      72 inches (182.88 cm) Weight:      231 pounds (105.00 kg) O2 Sat:      96 % on Room air Temp:     96.7 degrees F (35.94 degrees C) oral Pulse rate:   83 / minute BP sitting:   112 / 60  (left arm) Cuff size:   large  Vitals Entered By: Josph Macho RMA (April 25, 2009 8:08 AM)  O2 Flow:  Room air CC: Follow-up visit/ Pt states he is no longer taking Penlac (insurance will not pay for it)/ CF Is Patient Diabetic? Yes   CC:  Follow-up visit/ Pt states he is no longer taking Penlac (insurance will not pay for it)/ CF.  History of Present Illness: he brings a continuous glucose monitor record which i have reviewed today.  his cbg went high after a recent right shoulder injury.  otherwise, it is persistently below 100 in the afternoon, and highest at hs, and throughout the night.    Current Medications (verified): 1)  Buspirone Hcl 15 Mg Tabs (Buspirone Hcl) .... Take 1 By Mouth Two Times A Day Qd 2)  Humalog 100 Unit/ml Soln (Insulin Lispro (Human)) .... For Use in Pump, 80 Units A Day 3)  Zetia 10 Mg Tabs (Ezetimibe) .... Take 1 By Mouth Qd 4)  Crestor 40 Mg  Tabs (Rosuvastatin Calcium) .... Take 1 By Mouth Qd 5)  Onetouch Ultra Test   Strp (Glucose Blood) .... 5/day, Lancets Also 6)  Onetouch Lancets   Misc (Lancets) .... Use As Directed 7)  Sof-Sensor   Misc (Insulin Infusion Pump Supplies) .... Change 2/week 8)  Flomax 0.4 Mg  Cp24 (Tamsulosin Hcl) .... Take 1 By Mouth Qd 9)  Zestoretic 10-12.5 Mg  Tabs (Lisinopril-Hydrochlorothiazide) .Marland Kitchen.. 1 Qd 10)  Penlac 8 % Soln (Ciclopirox) .... Qhs 11)  Cialis 20 Mg Tabs (Tadalafil) .... As Needed Use  Allergies (verified): No Known Drug Allergies  Past History:  Past Medical History: Last updated: 02/21/2008 ROUTINE GENERAL MEDICAL EXAM@HEALTH  CARE FACL (ICD-V70.0) ELBOW PAIN, RIGHT (ICD-719.42) ADENOIDECTOMY, HX OF (ICD-V45.79) KNEE  PAIN, CHRONIC (ICD-719.46) TESTICULAR MASS, LEFT (ICD-608.89) ERECTILE DYSFUNCTION (ICD-302.72) HYPERTENSION (ICD-401.9) HYPERLIPIDEMIA (ICD-272.4) DIABETES MELLITUS, TYPE I (ICD-250.01) ANXIETY (ICD-300.00) DIABETES MELLITUS, TYPE I, UNCONTROLLED (ICD-250.03)  Review of Systems  The patient denies syncope.    Physical Exam  General:  normal appearance.   Psych:  Alert and cooperative; normal mood and affect; normal attention span and concentration.     Impression & Recommendations:  Problem # 1:  DIABETES MELLITUS, TYPE I (ICD-250.01) he needs adjustments bason his continuous glucose monitor.    Medications Added to Medication List This Visit: 1)  Accu-chek Multiclix Lancets Misc (Lancets) .... 5/day 250.01  Other Orders: Est. Patient Level III ([address])  Patient Instructions: 1)  pending the test results, please continue the same medications for now. 2)  continue correction bolus (which some people call "sensitivity," or "insulin sensitivity ratio," or just "isr") of 1 unit for each 50 by which your glucose exceeds 100. 3)  tests are being ordered for you today.  a few days after the test(s), please call [phone] to hear your test results.   4)  Please schedule an appointment for a physical in 3 months, with labs day of visit. 5)  continue basal rate of 1.5 units/hr, except increase to 2 units/hr, 3 am-6 am. 6)  continue bolus of 1 unit/4 grams carbohydrate, except add 2 units to breakfast, subtract 8 from lunch, and add 8 to supper. Prescriptions: ACCU-CHEK MULTICLIX LANCETS  MISC (LANCETS) 5/day 250.01  #450 x 3   Entered and Authorized by:   Minus Breeding MD   Signed by:   Minus Breeding MD on 04/25/2009   Method used:   Faxed to ...       [facility]) (mail-order)             , [place]    [place]       Ph:        Fax: [phone]   RxID:   [id number] ACCU-CHEK MULTICLIX LANCETS  MISC (LANCETS) 5/day 250.01  #150 x 11   Entered and Authorized by:   Minus Breeding MD   Signed by:   Minus Breeding MD on 04/25/2009   Method used:   Electronically to        [facility]* (retail)       [address] [place], [place]  [address]       Ph: [phone]       Fax: [phone]   RxID:   [id number]

## 2010-03-20 NOTE — Miscellaneous (Signed)
Summary: [facility]  Clinical Lists Changes  Medications: Added new medication of MOVIPREP 100 GM  SOLR (PEG-KCL-NACL-NASULF-NA ASC-C) As per prep instructions. - Signed Rx of MOVIPREP 100 GM  SOLR (PEG-KCL-NACL-NASULF-NA ASC-C) As per prep instructions.;  #1 x 0;  Signed;  Entered by: Ezra Sites RN;  Authorized by: Mardella Layman MD [facility];  Method used: Electronically to [facility]*, [address], [place], [place]  [address], Ph: [phone], Fax: [phone] Observations: Added new observation of NKA: T (01/27/2010 8:10)    Prescriptions: MOVIPREP 100 GM  SOLR (PEG-KCL-NACL-NASULF-NA ASC-C) As per prep instructions.  #1 x 0   Entered by:   Ezra Sites RN   Authorized by:   Mardella Layman MD [facility]   Signed by:   Ezra Sites RN on 01/27/2010   Method used:   Electronically to        [facility]* (retail)       [address] [place], [place]  [address]       Ph: [phone]       Fax: [phone]   RxID:   [id number]

## 2010-03-20 NOTE — Letter (Signed)
Summary: Insulin Pump Therapy / [facility]  Insulin Pump Therapy / [facility]   Imported By: Lennie Odor 02/03/2010 09:18:45  _____________________________________________________________________  External Attachment:    Type:   Image     Comment:   External Document

## 2010-03-20 NOTE — Progress Notes (Signed)
Summary: ov needed  Phone Note Outgoing Call   Summary of Call: Per Everardo All patient need f.u office visit-left message on machine to call back to office. Initial call taken by: Lucious Groves,  March 15, 2009 8:50 AM  Follow-up for Phone Call        left message on machine for pt to return my call  Follow-up by: Margaret Pyle, CMA,  March 18, 2009 8:50 AM  Additional Follow-up for Phone Call Additional follow up Details #1::        Pt called back and sched appt for 3/10 @8 :30 am with Dr Everardo All. Additional Follow-up by: Verdell Face,  March 19, 2009 10:23 AM

## 2010-03-20 NOTE — Letter (Signed)
Summary: Colonoscopy Letter  [facility]  [address] [place], [place] [address]   Phone: [phone]  Fax: [phone]      Jul 04, 2009 MRN: [id number]   DENNY MCCREE [address] [place], [place]  [address]   Dear Mr. THAYER,   According to your medical record, it is time for you to schedule a Colonoscopy. The American Cancer Society recommends this procedure as a method to detect early colon cancer. Patients with a family history of colon cancer, or a personal history of colon polyps or inflammatory bowel disease are at increased risk.  This letter has beeen generated based on the recommendations made at the time of your procedure. If you feel that in your particular situation this may no longer apply, please contact our office.  Please call our office at [phone] to schedule this appointment or to update your records at your earliest convenience.  Thank you for cooperating with [place] to provide you with the very best care possible.   Sincerely,   Vania Rea. [facility] [phone]

## 2010-03-20 NOTE — Letter (Signed)
Summary: [facility]  [facility]   Imported By: Lester Aniak 12/16/2009 10:28:04  _____________________________________________________________________  External Attachment:    Type:   Image     Comment:   External Document

## 2010-03-20 NOTE — Procedures (Signed)
Summary: Colonoscopy  Patient: Kenneth Pace Note: All result statuses are Final unless otherwise noted.  Tests: (1) Colonoscopy (COL)   COL Colonoscopy           DONE     [facility]     [address]     [place], [place]  [address]           COLONOSCOPY PROCEDURE REPORT           PATIENT:  Pace, Kenneth  MR#:  [id number]     BIRTHDATE:  09/13/1950, 59 yrs. old  GENDER:  male     ENDOSCOPIST:  Laisha Rau R. Neil Errickson, MD, FACG     REF. BY:     PROCEDURE DATE:  02/05/2010     PROCEDURE:  Average-risk screening colonoscopy     G0121     ASA CLASS:  Class II     INDICATIONS:  Routine Risk Screening prior incomplete exam.     MEDICATIONS:   Fentanyl 75 mcg IV, Versed 7 mg IV           DESCRIPTION OF PROCEDURE:   After the risks benefits and     alternatives of the procedure were thoroughly explained, informed     consent was obtained.  Digital rectal exam was performed and     revealed no abnormalities.   The LB 180AL [id number] endoscope was     introduced through the anus and advanced to the cecum, which was     identified by both the appendix and ileocecal valve, limited by     poor preparation, a redundant colon.    The quality of the prep     was poor, using MoviPrep.  The instrument was then slowly     withdrawn as the colon was fully examined.     <<PROCEDUREIMAGES>>           FINDINGS:  No polyps or cancers were seen.  This was otherwise a     normal examination of the colon.   Retroflexed views in the rectum     revealed no abnormalities.    The scope was then withdrawn from     the patient and the procedure completed.           COMPLICATIONS:  None     ENDOSCOPIC IMPRESSION:     1) No polyps or cancers     2) Otherwise normal examination     RECOMMENDATIONS:     1) Repeat Colonoscopy in 5 years.     REPEAT EXAM:  No           ______________________________     Aadith Raudenbush R. Everet Flagg, MD, FACG           CC:  Sean A Ellison, MD           n.  eSIGNED:   Sadhana Frater R. Foday Cone at 02/05/2010 09:19 AM           Pace, Kenneth, [id number]  Note: An exclamation mark (!) indicates a result that was not dispersed into the flowsheet. Document Creation Date: 02/05/2010 11:20 AM _______________________________________________________________________  (1) Order result status: Final Collection or observation date-time: 02/05/2010 09:08 Requested date-time:  Receipt date-time:  Reported date-time:  Referring Physician:   Ordering Physician: Caera Enwright ([id number]) Specimen Source:  Source: EndoProS Filler Order Number: [id number] Lab site:   Appended Document: Colonoscopy    Clinical Lists Changes  Observations: Added new observation of COLONNXTDUE: 01/2015 (02/05/2010 11:22)     

## 2010-03-20 NOTE — Letter (Signed)
Summary: Profile/[employer]  [facility]/[employer]   Imported By: Lester Puxico 04/10/2009 07:49:35  _____________________________________________________________________  External Attachment:    Type:   Image     Comment:   External Document

## 2010-03-20 NOTE — Letter (Signed)
Summary: [facility]  [facility]   Imported By: Lennie Odor 02/24/2010 14:27:35  _____________________________________________________________________  External Attachment:    Type:   Image     Comment:   External Document

## 2010-03-20 NOTE — Letter (Signed)
Summary: Insulin therapy orders  Insulin therapy orders   Imported By: Lester Boise 01/31/2010 07:20:39  _____________________________________________________________________  External Attachment:    Type:   Image     Comment:   External Document

## 2010-03-20 NOTE — Letter (Signed)
Summary: [facility] Consult Scheduled Letter  [facility]  [address] [place], [place] [address]   Phone: [phone]  Fax: [phone]      02/06/2010 MRN: [id number]  Kenneth Pace [address] [place], [place]  [address]    Dear Mr. SCHWINN,      We have scheduled an appointment for you.  At the recommendation of Dr.Ellison, we have scheduled you a consult with DR Vernie Ammons on 02/27/10 at 10:00am.  Their phone number is [phone].  If this appointment day and time is not convenient for you, please feel free to call the office of the doctor you are being referred to at the number listed above and reschedule the appointment.    [facility] [address] [place], [place] [address]    Thank you,  Patient Care Coordinator [facility]

## 2010-03-20 NOTE — Medication Information (Signed)
Summary: Cialis Denied/[facility]  Cialis Denied/[facility]   Imported By: Sherian Rein 01/01/2010 12:43:44  _____________________________________________________________________  External Attachment:    Type:   Image     Comment:   External Document

## 2010-03-20 NOTE — Assessment & Plan Note (Signed)
Summary: FU Kenneth Pace   Vital Signs:  Patient profile:   60 year old male Height:      72 inches (182.88 cm) Weight:      233.25 pounds (106.02 kg) BMI:     31.75 O2 Sat:      96 % on Room air Temp:     98.4 degrees F (36.89 degrees C) oral Pulse rate:   75 / minute BP sitting:   130 / 78  (left arm) Cuff size:   large  Vitals Entered By: Brenton Grills CMA Duncan Dull) (February 03, 2010 8:09 AM)  O2 Flow:  Room air CC: Follow-up visit/aj Is Patient Diabetic? Yes   CC:  Follow-up visit/aj.  History of Present Illness: the status of at least 3 ongoing medical problems is addressed today: bph: he says he needs the flomax.  if he misses a dose or 2, bph sxs recur dm: total daily dosage is approx 80 units/day.  he has been taking extra basal insulin the early hrs of the am.  i accessed his cgm record via the internet.  it is well-controlled in the postprandial state, but is high overnight.  ed:  pt says he continues to require cialis.  Current Medications (verified): 1)  Buspirone Hcl 15 Mg Tabs (Buspirone Hcl) .... Take 1 By Mouth Two Times A Day Qd 2)  Humalog 100 Unit/ml Soln (Insulin Lispro (Human)) .... For Use in Pump, 80 Units A Day 3)  Zetia 10 Mg Tabs (Ezetimibe) .... Take 1 By Mouth Qd 4)  Crestor 40 Mg  Tabs (Rosuvastatin Calcium) .... Take 1 By Mouth Qd 5)  Onetouch Ultra Test   Strp (Glucose Blood) .... 5/day, Lancets Also 6)  Sof-Sensor   Misc (Insulin Infusion Pump Supplies) .... Change 2/week 7)  Flomax 0.4 Mg  Cp24 (Tamsulosin Hcl) .... Take 1 By Mouth Qd 8)  Zestoretic 10-12.5 Mg  Tabs (Lisinopril-Hydrochlorothiazide) .Marland Kitchen.. 1 Qd 9)  Accu-Chek Multiclix Lancets  Misc (Lancets) .... 5/day 250.01 10)  Moviprep 100 Gm  Solr (Peg-Kcl-Nacl-Nasulf-Na Asc-C) .... As Per Prep Instructions.  Allergies (verified): No Known Drug Allergies  Past History:  Past Medical History: Last updated: 02/21/2008 ROUTINE GENERAL MEDICAL EXAM@HEALTH  CARE FACL (ICD-V70.0) ELBOW PAIN, RIGHT  (ICD-719.42) ADENOIDECTOMY, HX OF (ICD-V45.79) KNEE PAIN, CHRONIC (ICD-719.46) TESTICULAR MASS, LEFT (ICD-608.89) ERECTILE DYSFUNCTION (ICD-302.72) HYPERTENSION (ICD-401.9) HYPERLIPIDEMIA (ICD-272.4) DIABETES MELLITUS, TYPE I (ICD-250.01) ANXIETY (ICD-300.00) DIABETES MELLITUS, TYPE I, UNCONTROLLED (ICD-250.03)  Review of Systems       denies dysuria   Impression & Recommendations:  Problem # 1:  DIABETES MELLITUS, TYPE I (ICD-250.01) needs increased rx  Problem # 2:  BENIGN PROSTATIC HYPERTROPHY, WITH OBSTRUCTION (ICD-600.01) he requires flomax for this  Problem # 3:  ERECTILE DYSFUNCTION (ICD-302.72) the cialis interacts with flomax  Other Orders: Urology Referral (Urology) Est. Patient Level IV ([address])  Patient Instructions: 1)  refer urology.  you will be called with a day and time for an appointment. 2)  Please schedule an appointment in 3 months, with labs day of visit. 3)  increase basal rate to 1.8 units/hr, except increase to 2 units/hr, 3 am-6 am. 4)  continue bolus of 1 unit/4 grams carbohydrate, except add 2 units to breakfast, subtract 8 from lunch, and add 8 to supper.   5)  Please schedule a follow-up appointment in 6 weeks.   Orders Added: 1)  Urology Referral [Urology] 2)  Est. Patient Level IV [[address]]   Immunization History:  Influenza Immunization History:    Influenza:  historical (11/16/2009)   Immunization History:  Influenza Immunization History:    Influenza:  Historical (11/16/2009)

## 2010-03-20 NOTE — Medication Information (Signed)
Summary: Denial/[facility]  Denial/[facility]   Imported By: Lester Bradley 12/26/2009 10:27:12  _____________________________________________________________________  External Attachment:    Type:   Image     Comment:   External Document

## 2010-03-21 NOTE — Letter (Signed)
Summary: [facility]  [facility]   Imported By: Lester Northlake 10/31/2009 10:44:44  _____________________________________________________________________  External Attachment:    Type:   Image     Comment:   External Document

## 2010-03-24 ENCOUNTER — Encounter: Payer: Self-pay | Admitting: Endocrinology

## 2010-03-29 ENCOUNTER — Encounter: Payer: Self-pay | Admitting: Endocrinology

## 2010-04-07 NOTE — Procedures (Deleted)
Summary: Colonoscopy  Patient: Kenneth Pace Note: All result statuses are Final unless otherwise noted.  Tests: (1) Colonoscopy (COL)   COL Colonoscopy           DONE     [facility]     [address]     [place], [place]  [address]           COLONOSCOPY PROCEDURE REPORT           PATIENT:  Kenneth Pace, Kenneth Pace  MR#:  [id number]     BIRTHDATE:  March 27, 1950, 59 yrs. old  GENDER:  male     ENDOSCOPIST:  Vania Rea. Jarold Motto, MD, [facility]     REF. BY:     PROCEDURE DATE:  02/05/2010     PROCEDURE:  Average-risk screening colonoscopy     G0121     ASA CLASS:  Class II     INDICATIONS:  Routine Risk Screening prior incomplete exam.     MEDICATIONS:   Fentanyl 75 mcg IV, Versed 7 mg IV           DESCRIPTION OF PROCEDURE:   After the risks benefits and     alternatives of the procedure were thoroughly explained, informed     consent was obtained.  Digital rectal exam was performed and     revealed no abnormalities.   The LB 180AL [id number] endoscope was     introduced through the anus and advanced to the cecum, which was     identified by both the appendix and ileocecal valve, limited by     poor preparation, a redundant colon.    The quality of the prep     was poor, using MoviPrep.  The instrument was then slowly     withdrawn as the colon was fully examined.     <<PROCEDUREIMAGES>>           FINDINGS:  No polyps or cancers were seen.  This was otherwise a     normal examination of the colon.   Retroflexed views in the rectum     revealed no abnormalities.    The scope was then withdrawn from     the patient and the procedure completed.           COMPLICATIONS:  None     ENDOSCOPIC IMPRESSION:     1) No polyps or cancers     2) Otherwise normal examination     RECOMMENDATIONS:     1) Repeat Colonoscopy in 5 years.     REPEAT EXAM:  No           ______________________________     Vania Rea. Jarold Motto, MD, [name]           CC:  Minus Breeding, MD           n.  Rosalie DoctorMarland Kitchen   Vania Rea. Patterson at 02/05/2010 09:19 AM           Lynnell Grain, [id number]  Note: An exclamation mark (!) indicates a result that was not dispersed into the flowsheet. Document Creation Date: 02/05/2010 11:20 AM _______________________________________________________________________  (1) Order result status: Final Collection or observation date-time: 02/05/2010 09:08 Requested date-time:  Receipt date-time:  Reported date-time:  Referring Physician:   Ordering Physician: Sheryn Bison [phone]) Specimen Source:  Source: Launa Grill Order Number: [id number] Lab site:   Appended Document: Colonoscopy    Clinical Lists Changes  Observations: Added new observation of COLONNXTDUE: 01/2015 (02/05/2010 11:22)

## 2010-04-09 NOTE — Letter (Signed)
Summary: [facility]   Imported By: Sherian Rein 04/01/2010 09:22:35  _____________________________________________________________________  External Attachment:    Type:   Image     Comment:   External Document

## 2010-04-28 ENCOUNTER — Encounter: Payer: Self-pay | Admitting: Endocrinology

## 2010-05-06 NOTE — Letter (Signed)
Summary: [facility]   Imported By: Sherian Rein 05/01/2010 10:42:29  _____________________________________________________________________  External Attachment:    Type:   Image     Comment:   External Document

## 2010-05-26 LAB — GLUCOSE, CAPILLARY: Glucose-Capillary: 143 mg/dL — ABNORMAL HIGH (ref 70–99)

## 2010-07-07 ENCOUNTER — Other Ambulatory Visit (INDEPENDENT_AMBULATORY_CARE_PROVIDER_SITE_OTHER): Payer: Managed Care, Other (non HMO)

## 2010-07-07 ENCOUNTER — Ambulatory Visit (INDEPENDENT_AMBULATORY_CARE_PROVIDER_SITE_OTHER): Payer: Managed Care, Other (non HMO) | Admitting: Endocrinology

## 2010-07-07 ENCOUNTER — Encounter: Payer: Self-pay | Admitting: Endocrinology

## 2010-07-07 VITALS — BP 130/72 | HR 80 | Temp 97.7°F | Resp 16 | Wt 231.0 lb

## 2010-07-07 DIAGNOSIS — E1065 Type 1 diabetes mellitus with hyperglycemia: Secondary | ICD-10-CM

## 2010-07-07 DIAGNOSIS — IMO0002 Reserved for concepts with insufficient information to code with codable children: Secondary | ICD-10-CM

## 2010-07-07 LAB — HEMOGLOBIN A1C: Hgb A1c MFr Bld: 10.2 % — ABNORMAL HIGH (ref 4.6–6.5)

## 2010-07-07 MED ORDER — SCOPOLAMINE 1 MG/3DAYS TD PT72: 1.0000 | MEDICATED_PATCH | TRANSDERMAL | Status: DC

## 2010-07-07 MED ORDER — SILDENAFIL CITRATE 100 MG PO TABS: 100.0000 mg | ORAL_TABLET | ORAL | Status: DC | PRN

## 2010-07-07 NOTE — Patient Instructions (Addendum)
blood tests are being ordered for you today.  please call [phone] to hear your test results.  You will be prompted to enter the 9-digit "MRN" number that appears at the top left of this page, followed by #.  Then you will hear the message. pending the test results, please continue the same medications for now reduce basal rate of 1.6 units/hr, except  2 units/hr, 3 am-6 am. continue bolus of 1 unit/4 grams carbohydrate, except add 6 units to breakfast, subtract 10 from lunch, and add 12 to supper.continue correction bolus (which some people call "sensitivity," or "insulin sensitivity ratio," or just "isr") of 1 unit for each 50 by which your glucose exceeds 100. You should call [phone], to a pharmacy in [place], which sells viagra at $65 for 20 pills.  Here is a prescription. Please make a follow-up appointment in 3 months. (update: i left message on phone-tree:  rx as we discussed, except add 8 units to breakfast, and 14 to supper).

## 2010-07-07 NOTE — Progress Notes (Signed)
  Subjective:    Patient ID: ARN MCOMBER, male    DOB: 10-22-1950, 60 y.o.   MRN: [id number]  HPI The state of at least three ongoing medical problems is addressed today: Dm: we reviewed cgm data together, from the [employer].  He continues to have hyperglycemia after breakfast, and after supper. Bph:  sxs are resolved with recent turp.   Ed: sxs persist.  He says he would like to try viagra again.   Past Medical History  Diagnosis Date  . DIABETES MELLITUS, TYPE I, UNCONTROLLED 12/29/2006  . HYPERLIPIDEMIA 02/16/2007  . ANXIETY 02/16/2007  . ERECTILE DYSFUNCTION 02/16/2007  . HYPERTENSION 02/16/2007  . TESTICULAR MASS, LEFT 02/16/2007  . ADENOIDECTOMY, HX OF 02/16/2007  . BENIGN PROSTATIC HYPERTROPHY, WITH OBSTRUCTION 02/03/2010    Past Surgical History  Procedure Date  . Tonsillectomy     History   Social History  . Marital Status: Married    Spouse Name: N/A    Number of Children: N/A  . 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 Smoking status: Never Smoker   . Smokeless tobacco: Not on file  . Alcohol Use: Not on file  . Drug Use: Not on file  . Sexually Active: Not on file   Other Topics Concern  . Not on file   Social History Narrative  . No narrative on file    Current Outpatient Prescriptions on File Prior to Visit  Medication Sig Dispense Refill  . busPIRone (BUSPAR) 15 MG tablet Take 15 mg by mouth 2 (two) times daily.        . Continuous Glucose Monitor Sup (SOF-SENSOR) MISC Change 2/week       . ezetimibe (ZETIA) 10 MG tablet Take 10 mg by mouth daily.        Marland Kitchen glucose blood (ONE TOUCH ULTRA TEST) test strip 5/day dx 250.01       . insulin lispro (HUMALOG) 100 UNIT/ML injection For use in pump, 80 units a day       . Lancets Misc. (ACCU-CHEK MULTICLIX LANCET DEV) KIT 5/day dx 250.01       . lisinopril-hydrochlorothiazide (PRINZIDE,ZESTORETIC) 10-12.5 MG per tablet  Take 1 tablet by mouth daily.        . rosuvastatin (CRESTOR) 40 MG tablet Take 40 mg by mouth daily.        Marland Kitchen DISCONTD: Tamsulosin HCl (FLOMAX) 0.4 MG CAPS Take 0.4 mg by mouth daily.          No Known Allergies  Family History  Problem Relation Age of Onset  . Cancer Neg Hx     BP 130/72  Pulse 80  Temp(Src) 97.7 F (36.5 C) (Oral)  Resp 16  Wt 231 lb (104.781 kg)  SpO2 96%     Review of Systems denies hypoglycemia and dysuria.    Objective:   Physical Exam Pulses: dorsalis pedis intact bilat.   Feet: no deformity.  no ulcer on the feet.  feet are of normal color and temp.  no edema.  There is bilat onychomycosis. Neuro: sensation is intact to touch on the feet.      Lab Results  Component Value Date   HGBA1C 10.2* 07/07/2010      Assessment & Plan:  Dm, needs increased rx Bph, much better Ed, needs increased rx

## 2010-07-09 ENCOUNTER — Other Ambulatory Visit: Payer: Self-pay

## 2010-07-09 MED ORDER — SCOPOLAMINE 1 MG/3DAYS TD PT72: 1.0000 | MEDICATED_PATCH | TRANSDERMAL | Status: DC

## 2010-10-21 ENCOUNTER — Ambulatory Visit (INDEPENDENT_AMBULATORY_CARE_PROVIDER_SITE_OTHER): Payer: Managed Care, Other (non HMO) | Admitting: Endocrinology

## 2010-10-21 ENCOUNTER — Other Ambulatory Visit (INDEPENDENT_AMBULATORY_CARE_PROVIDER_SITE_OTHER): Payer: Managed Care, Other (non HMO)

## 2010-10-21 ENCOUNTER — Telehealth: Payer: Self-pay | Admitting: *Deleted

## 2010-10-21 ENCOUNTER — Encounter: Payer: Self-pay | Admitting: Endocrinology

## 2010-10-21 VITALS — BP 128/82 | HR 72 | Temp 98.5°F | Ht 71.0 in | Wt 232.8 lb

## 2010-10-21 DIAGNOSIS — E1065 Type 1 diabetes mellitus with hyperglycemia: Secondary | ICD-10-CM

## 2010-10-21 DIAGNOSIS — Z0389 Encounter for observation for other suspected diseases and conditions ruled out: Secondary | ICD-10-CM

## 2010-10-21 DIAGNOSIS — IMO0002 Reserved for concepts with insufficient information to code with codable children: Secondary | ICD-10-CM

## 2010-10-21 DIAGNOSIS — Z0000 Encounter for general adult medical examination without abnormal findings: Secondary | ICD-10-CM

## 2010-10-21 MED ORDER — SILDENAFIL CITRATE 100 MG PO TABS: 100.0000 mg | ORAL_TABLET | ORAL | Status: DC | PRN

## 2010-10-21 NOTE — Progress Notes (Signed)
Subjective:    Patient ID: Kenneth Pace, male    DOB: 1950-03-26, 60 y.o.   MRN: [id number]  HPI pt states he feels well in general.  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  We have reviewed cgm data from the [employer] together.  It is often over 200, but lowest in the afternoon.  He averages 100 units of humalog per day, via his pump.   Past Medical History  Diagnosis Date  . DIABETES MELLITUS, TYPE I, UNCONTROLLED 12/29/2006  . HYPERLIPIDEMIA 02/16/2007  . ANXIETY 02/16/2007  . ERECTILE DYSFUNCTION 02/16/2007  . HYPERTENSION 02/16/2007  . TESTICULAR MASS, LEFT 02/16/2007  . ADENOIDECTOMY, HX OF 02/16/2007  . BENIGN PROSTATIC HYPERTROPHY, WITH OBSTRUCTION 02/03/2010    Past Surgical History  Procedure Date  . Tonsillectomy     History   Social History  . Marital Status: Married    Spouse Name: N/A    Number of Children: N/A  . 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 Smoking status: Never Smoker   . Smokeless tobacco: Not on file  . Alcohol Use: Not on file  . Drug Use: Not on file  . Sexually Active: Not on file   Other Topics Concern  . Not on file   Social History Narrative  . No narrative on file    Current Outpatient Prescriptions on File Prior to Visit  Medication Sig Dispense Refill  . aspirin 81 MG tablet Take 81 mg by mouth daily.        . busPIRone (BUSPAR) 15 MG tablet Take 15 mg by mouth 2 (two) times daily.        Marland Kitchen co-enzyme Q-10 30 MG capsule Take 30 mg by mouth daily.        . Continuous Glucose Monitor Sup (SOF-SENSOR) MISC Change 2/week       . Cranberry 400 MG TABS Take 2 each by mouth daily.        . Cyanocobalamin (VITAMIN B-12 CR PO) Take 1 each by mouth daily.        Marland Kitchen ezetimibe (ZETIA) 10 MG tablet Take 10 mg by mouth daily.        Marland Kitchen glucose blood (ONE TOUCH ULTRA TEST) test strip 5/day dx 250.01       . insulin  lispro (HUMALOG) 100 UNIT/ML injection For use in pump, 80 units a day       . Lancets Misc. (ACCU-CHEK MULTICLIX LANCET DEV) KIT 5/day dx 250.01       . lisinopril-hydrochlorothiazide (PRINZIDE,ZESTORETIC) 10-12.5 MG per tablet Take 1 tablet by mouth daily.        . Multiple Vitamins-Minerals (MULTIVITAMIN,TX-MINERALS) tablet Take 1 tablet by mouth daily.        . rosuvastatin (CRESTOR) 40 MG tablet Take 40 mg by mouth daily.        Marland Kitchen scopolamine (TRANSDERM-SCOP) 1.5 MG Place 1 patch (1.5 mg total) onto the skin every 3 (three) days.  4 patch  1    No Known Allergies  Family History  Problem Relation Age of Onset  . Cancer Neg Hx     BP 128/82  Pulse 72  Temp(Src) 98.5 F (36.9 C) (Oral)  Ht 5\' 11"  (1.803 m)  Wt 232 lb 12.8 oz (105.597 kg)  BMI 32.47 kg/m2  SpO2 97%    Review of Systems  denies hypoglycemia.      Objective:   Physical Exam VITAL SIGNS:  See vs page GENERAL: no distress Pulses: dorsalis pedis intact bilat.   Feet: no deformity.  no ulcer on the feet.  feet are of normal color and temp.  no edema.  There is bilat onychomycosis of toenails Neuro: sensation is intact to touch on the feet     Assessment & Plan:  Dm.  he needs some adjustment in his therapy

## 2010-10-21 NOTE — Patient Instructions (Addendum)
blood tests are being ordered for you today.  please call [phone] to hear your test results.  You will be prompted to enter the 9-digit "MRN" number that appears at the top left of this page, followed by #.  Then you will hear the message. pending the test results, please: continue basal rate of 1.6 units/hr, except  2 units/hr, 3 am-6 am.  increase bolus to1 unit/3 grams carbohydrate, except subtract 10 units from lunch bolus.  continue correction bolus (which some people call "sensitivity," or "insulin sensitivity ratio," or just "isr") of 1 unit for each 50 by which your glucose exceeds 100. Please make a regular physical appointment in 3 months

## 2010-10-21 NOTE — Telephone Encounter (Signed)
Labs for upcoming CPX placed into Epic 

## 2010-10-21 NOTE — Telephone Encounter (Signed)
Message copied by Carin Primrose on Tue Oct 21, 2010  1:32 PM ------      Message from: Etheleen Sia      Created: Tue Oct 21, 2010  9:11 AM      Regarding: PHYSICAL LABS       MR Wisman IS COMING DEC 10 FOR A PHYSICAL PER SAE.   I DON'T SEE THE PHYSICAL LABS IN THE LAB SECTION.  HE MAY ALSO NEED AIC AND MALB.

## 2010-10-27 ENCOUNTER — Encounter: Payer: Self-pay | Admitting: Endocrinology

## 2011-01-19 ENCOUNTER — Other Ambulatory Visit (INDEPENDENT_AMBULATORY_CARE_PROVIDER_SITE_OTHER): Payer: Managed Care, Other (non HMO)

## 2011-01-19 DIAGNOSIS — E1065 Type 1 diabetes mellitus with hyperglycemia: Secondary | ICD-10-CM

## 2011-01-19 DIAGNOSIS — Z0389 Encounter for observation for other suspected diseases and conditions ruled out: Secondary | ICD-10-CM

## 2011-01-19 DIAGNOSIS — IMO0002 Reserved for concepts with insufficient information to code with codable children: Secondary | ICD-10-CM

## 2011-01-19 DIAGNOSIS — Z0000 Encounter for general adult medical examination without abnormal findings: Secondary | ICD-10-CM

## 2011-01-19 LAB — HEPATIC FUNCTION PANEL
ALT: 31 U/L (ref 0–53)
Albumin: 4.2 g/dL (ref 3.5–5.2)
Alkaline Phosphatase: 135 U/L — ABNORMAL HIGH (ref 39–117)
Total Protein: 7 g/dL (ref 6.0–8.3)

## 2011-01-19 LAB — MICROALBUMIN / CREATININE URINE RATIO
Creatinine,U: 203.3 mg/dL
Microalb Creat Ratio: 0.9 mg/g (ref 0.0–30.0)

## 2011-01-19 LAB — BASIC METABOLIC PANEL
CO2: 26 mEq/L (ref 19–32)
Chloride: 107 mEq/L (ref 96–112)
Creatinine, Ser: 0.9 mg/dL (ref 0.4–1.5)
Potassium: 4.1 mEq/L (ref 3.5–5.1)

## 2011-01-19 LAB — TSH: TSH: 1.67 u[IU]/mL (ref 0.35–5.50)

## 2011-01-19 LAB — URINALYSIS, ROUTINE W REFLEX MICROSCOPIC
Specific Gravity, Urine: >=1.03 (ref 1.000–1.030)
Total Protein, Urine: NEGATIVE
Urine Glucose: 500

## 2011-01-19 LAB — CBC WITH DIFFERENTIAL/PLATELET
Basophils Relative: 0.4 % (ref 0.0–3.0)
Eosinophils Relative: 2.6 % (ref 0.0–5.0)
Lymphocytes Relative: 25.7 % (ref 12.0–46.0)
Monocytes Relative: 9.9 % (ref 3.0–12.0)
Neutrophils Relative %: 61.4 % (ref 43.0–77.0)
RBC: 5.45 Mil/uL (ref 4.22–5.81)
WBC: 6.8 10*3/uL (ref 4.5–10.5)

## 2011-01-19 LAB — LIPID PANEL
Cholesterol: 107 mg/dL (ref 0–200)
Total CHOL/HDL Ratio: 3
Triglycerides: 86 mg/dL (ref 0.0–149.0)

## 2011-01-26 ENCOUNTER — Encounter: Payer: Self-pay | Admitting: Endocrinology

## 2011-01-26 ENCOUNTER — Ambulatory Visit (INDEPENDENT_AMBULATORY_CARE_PROVIDER_SITE_OTHER): Payer: Managed Care, Other (non HMO) | Admitting: Endocrinology

## 2011-01-26 VITALS — BP 132/70 | HR 67 | Temp 98.1°F | Ht 71.0 in | Wt 229.8 lb

## 2011-01-26 DIAGNOSIS — IMO0002 Reserved for concepts with insufficient information to code with codable children: Secondary | ICD-10-CM

## 2011-01-26 DIAGNOSIS — Z23 Encounter for immunization: Secondary | ICD-10-CM

## 2011-01-26 DIAGNOSIS — E1065 Type 1 diabetes mellitus with hyperglycemia: Secondary | ICD-10-CM

## 2011-01-26 DIAGNOSIS — Z136 Encounter for screening for cardiovascular disorders: Secondary | ICD-10-CM

## 2011-01-26 DIAGNOSIS — Z0000 Encounter for general adult medical examination without abnormal findings: Secondary | ICD-10-CM

## 2011-01-26 MED ORDER — BUSPIRONE HCL 15 MG PO TABS: 15.0000 mg | ORAL_TABLET | Freq: Two times a day (BID) | ORAL | Status: DC

## 2011-01-26 MED ORDER — LISINOPRIL-HYDROCHLOROTHIAZIDE 10-12.5 MG PO TABS: 1.0000 | ORAL_TABLET | Freq: Every day | ORAL | Status: DC

## 2011-01-26 MED ORDER — INSULIN LISPRO 100 UNIT/ML ~~LOC~~ SOLN: SUBCUTANEOUS | Status: DC

## 2011-01-26 MED ORDER — EZETIMIBE 10 MG PO TABS: 10.0000 mg | ORAL_TABLET | Freq: Every day | ORAL | Status: DC

## 2011-01-26 MED ORDER — ROSUVASTATIN CALCIUM 40 MG PO TABS: 40.0000 mg | ORAL_TABLET | Freq: Every day | ORAL | Status: DC

## 2011-01-26 MED ORDER — GLUCOSE BLOOD VI STRP: ORAL_STRIP | Status: DC

## 2011-01-26 NOTE — Progress Notes (Signed)
Subjective:    Patient ID: Kenneth Pace, male    DOB: Oct 17, 1950, 60 y.o.   MRN: [id number]  HPI here for regular wellness examination.  He's feeling pretty well in general, and says chronic med probs are stable, except as noted below. Past Medical History  Diagnosis Date  . DIABETES MELLITUS, TYPE I, UNCONTROLLED 12/29/2006  . HYPERLIPIDEMIA 02/16/2007  . ANXIETY 02/16/2007  . ERECTILE DYSFUNCTION 02/16/2007  . HYPERTENSION 02/16/2007  . TESTICULAR MASS, LEFT 02/16/2007  . ADENOIDECTOMY, HX OF 02/16/2007  . BENIGN PROSTATIC HYPERTROPHY, WITH OBSTRUCTION 02/03/2010    Past Surgical History  Procedure Date  . Tonsillectomy     History   Social History  . Marital Status: Married    Spouse Name: N/A    Number of Children: N/A  . 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 Smoking status: Never Smoker   . Smokeless tobacco: Not on file  . Alcohol Use: Not on file  . Drug Use: Not on file  . Sexually Active: Not on file   Other Topics Concern  . Not on file   Social History Narrative  . No narrative on file    Current Outpatient Prescriptions on File Prior to Visit  Medication Sig Dispense Refill  . aspirin 81 MG tablet Take 81 mg by mouth daily.        Marland Kitchen co-enzyme Q-10 30 MG capsule Take 30 mg by mouth daily.        . Continuous Glucose Monitor Sup (SOF-SENSOR) MISC Change 2/week       . Cranberry 400 MG TABS Take 2 each by mouth daily.        . Cyanocobalamin (VITAMIN B-12 CR PO) Take 1 each by mouth daily.        . Lancets Misc. (ACCU-CHEK MULTICLIX LANCET DEV) KIT 5/day dx 250.01       . Multiple Vitamins-Minerals (MULTIVITAMIN,TX-MINERALS) tablet Take 1 tablet by mouth daily.        Marland Kitchen scopolamine (TRANSDERM-SCOP) 1.5 MG Place 1 patch (1.5 mg total) onto the skin every 3 (three) days.  4 patch  1  . sildenafil (VIAGRA) 100 MG tablet Take 1 tablet (100 mg total) by mouth as needed for  erectile dysfunction.  20 tablet  5    No Known Allergies  Family History  Problem Relation Age of Onset  . Cancer Neg Hx     BP 132/70  Pulse 67  Temp(Src) 98.1 F (36.7 C) (Oral)  Ht 5\' 11"  (1.803 m)  Wt 229 lb 12.8 oz (104.237 kg)  BMI 32.05 kg/m2  SpO2 97%     Review of Systems  Constitutional: Negative for fever and unexpected weight change.  HENT: Negative for hearing loss.   Eyes: Negative for visual disturbance.  Respiratory: Negative for shortness of breath.   Cardiovascular: Negative for chest pain.  Gastrointestinal: Negative for blood in stool.  Genitourinary: Negative for hematuria and difficulty urinating.  Musculoskeletal: Negative for back pain.  Skin: Negative for rash.  Neurological: Negative for syncope and numbness.  Hematological: Does not bruise/bleed easily.       Objective:   Physical Exam VS: see vs page GEN: no distress HEAD: head: no deformity eyes: no periorbital swelling, no proptosis external nose and ears are normal mouth: no lesion seen NECK: supple, thyroid is not enlarged CHEST WALL: no deformity LUNGS: clear to auscultation BREASTS:  No gynecomastia CV: reg rate and rhythm, no murmur ABD: abdomen is soft, nontender.  no hepatosplenomegaly.  not distended.  no hernia  RECTAL: normal external and internal exam.  heme neg. PROSTATE:  Normal size.  No nodule MUSCULOSKELETAL: muscle bulk and strength are grossly normal.  no obvious joint swelling.  gait is normal and steady EXTEMITIES: no deformity.  no ulcer on the feet.  feet are of normal color and temp.  no edema.  There is bilateral onychomycosis PULSES: dorsalis pedis intact bilat.  no carotid bruit NEURO:  cn 2-12 grossly intact.   readily moves all 4's.  sensation is intact to touch on the feet SKIN:  Normal texture and temperature.  No rash or suspicious lesion is visible.   NODES:  None palpable at the neck PSYCH: alert, oriented x3.  Does not appear anxious nor  depressed.       Assessment & Plan:  Wellness visit today, with problems stable, except as noted.     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  We have reviewed cgm data from the [employer] together.  It is often over 200, but still lowest in the afternoon.  It is highest after evening meal.  He averages 90-100 units of humalog per day, via his pump.  PAST MEDICAL HISTORY reviewed and up to date today. REVIEW OF SYSTEMS: denies hypoglycemia PHYSICAL EXAMINATION: SKIN:  Insulin infusion and monitor sites at the anterior abdomen are normal, except for slight swelling at the most-recently used sites.  No tenderness VITAL SIGNS:  See vs page GENERAL: no distress LAB/XRAY RESULTS: Lab Results  Component Value Date   WBC 6.8 01/19/2011   HGB 17.0 01/19/2011   HCT 49.1 01/19/2011   PLT 176.0 01/19/2011   GLUCOSE 134* 01/19/2011   CHOL 107 01/19/2011   TRIG 86.0 01/19/2011   HDL 34.10* 01/19/2011   LDLCALC 56 01/19/2011   ALT 31 01/19/2011   AST 27 01/19/2011   NA 140 01/19/2011   K 4.1 01/19/2011   CL 107 01/19/2011   CREATININE 0.9 01/19/2011   BUN 22 01/19/2011   CO2 26 01/19/2011   TSH 1.67 01/19/2011   PSA 1.81 01/19/2011   HGBA1C 9.7* 01/19/2011   MICROALBUR 1.8 01/19/2011   IMPRESSION: DM,with persistent poor control PLAN: See instruction page

## 2011-01-26 NOTE — Patient Instructions (Addendum)
please consider these measures for your health:  minimize alcohol.  do not use tobacco products.  have a colonoscopy at least every 10 years from age 58.  keep firearms safely stored.  always use seat belts.  have working smoke alarms in your home.  see an eye doctor and dentist regularly.  never drive under the influence of alcohol or drugs (including prescription drugs).  those with fair skin should take precautions against the sun. please let me know what your wishes would be, if artificial life support measures should become necessary.   Please continue basal rate of 1.6 units/hr, except  2 units/hr, 3 am-6 am.  increase bolus to1 unit/3 grams carbohydrate, except subtract 15 units from lunch bolus, and add 10 units to supper bolus.  continue correction bolus (which some people call "sensitivity," or "insulin sensitivity ratio," or just "isr") of 1 unit for each 50 by which your glucose exceeds 100.  Please come back for a follow-up appointment in 3 months.

## 2011-02-10 ENCOUNTER — Other Ambulatory Visit: Payer: Self-pay | Admitting: Endocrinology

## 2011-04-29 ENCOUNTER — Encounter: Payer: Self-pay | Admitting: Endocrinology

## 2011-04-29 ENCOUNTER — Other Ambulatory Visit (INDEPENDENT_AMBULATORY_CARE_PROVIDER_SITE_OTHER): Payer: Managed Care, Other (non HMO)

## 2011-04-29 ENCOUNTER — Ambulatory Visit (INDEPENDENT_AMBULATORY_CARE_PROVIDER_SITE_OTHER): Payer: Managed Care, Other (non HMO) | Admitting: Endocrinology

## 2011-04-29 VITALS — BP 132/62 | HR 70 | Temp 97.7°F | Ht 72.0 in | Wt 231.4 lb

## 2011-04-29 DIAGNOSIS — E1065 Type 1 diabetes mellitus with hyperglycemia: Secondary | ICD-10-CM

## 2011-04-29 DIAGNOSIS — IMO0002 Reserved for concepts with insufficient information to code with codable children: Secondary | ICD-10-CM

## 2011-04-29 MED ORDER — SILDENAFIL CITRATE 100 MG PO TABS: 100.0000 mg | ORAL_TABLET | ORAL | Status: DC | PRN

## 2011-04-29 MED ORDER — BUSPIRONE HCL 30 MG PO TABS: 30.0000 mg | ORAL_TABLET | Freq: Two times a day (BID) | ORAL | Status: DC

## 2011-04-29 NOTE — Patient Instructions (Addendum)
blood tests are being requested for you today.  please call [phone] to hear your test results.  You will be prompted to enter the 9-digit "MRN" number that appears at the top left of this page, followed by #.  Then you will hear the message.   Please increase basal rate to 1.8 units/hr, except  2 units/hr, 3 am-6 am.  continue bolus of1 unit/3 grams carbohydrate, except subtract 15 units from lunch bolus, and add 10 units to supper bolus.   continue correction bolus (which some people call "sensitivity," or "insulin sensitivity ratio," or just "isr") of 1 unit for each 50 by which your glucose exceeds 100.  Please come back for a follow-up appointment in 3 months.   Increase buspar to 30 mg, 2x a day.   (update: i left message on phone-tree:  Increase basal rate to 2 units/hr)

## 2011-04-29 NOTE — Progress Notes (Signed)
Subjective:    Patient ID: Kenneth Pace, male    DOB: February 19, 1950, 61 y.o.   MRN: [id number]  HPI pt returns for f/u of type 1 DM (1984).  he feels well in general.  He says care of his dm has been improved by less frequent business travel recently.  He averages approx 120 units of humalog per day, via his pump.   Past Medical History  Diagnosis Date  . DIABETES MELLITUS, TYPE I, UNCONTROLLED 12/29/2006  . HYPERLIPIDEMIA 02/16/2007  . ANXIETY 02/16/2007  . ERECTILE DYSFUNCTION 02/16/2007  . HYPERTENSION 02/16/2007  . TESTICULAR MASS, LEFT 02/16/2007  . ADENOIDECTOMY, HX OF 02/16/2007  . BENIGN PROSTATIC HYPERTROPHY, WITH OBSTRUCTION 02/03/2010    Past Surgical History  Procedure Date  . Tonsillectomy     History   Social History  . Marital Status: Married    Spouse Name: N/A    Number of Children: N/A  . 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 Smoking status: Never Smoker   . Smokeless tobacco: Not on file  . Alcohol Use: Not on file  . Drug Use: Not on file  . Sexually Active: Not on file   Other Topics Concern  . Not on file   Social History Narrative  . No narrative on file    Current Outpatient Prescriptions on File Prior to Visit  Medication Sig Dispense Refill  . aspirin 81 MG tablet Take 81 mg by mouth daily.        Marland Kitchen co-enzyme Q-10 30 MG capsule Take 30 mg by mouth daily.        . Continuous Glucose Monitor Sup (SOF-SENSOR) MISC Change 2/week       . Cranberry 400 MG TABS Take 2 each by mouth daily.        . Cyanocobalamin (VITAMIN B-12 CR PO) Take 1 each by mouth daily.        Marland Kitchen ezetimibe (ZETIA) 10 MG tablet Take 1 tablet (10 mg total) by mouth daily.  90 tablet  3  . glucose blood (ONE TOUCH ULTRA TEST) test strip 5/day dx 250.01, and lancets  450 each  3  . insulin lispro (HUMALOG) 100 UNIT/ML injection Use in pump, 110 units daily  12 mL  3  . Lancets Misc. (ACCU-CHEK  MULTICLIX LANCET DEV) KIT 5/day dx 250.01       . lisinopril-hydrochlorothiazide (PRINZIDE,ZESTORETIC) 10-12.5 MG per tablet Take 1 tablet by mouth daily.  90 tablet  3  . Multiple Vitamins-Minerals (MULTIVITAMIN,TX-MINERALS) tablet Take 1 tablet by mouth daily.        . rosuvastatin (CRESTOR) 40 MG tablet Take 1 tablet (40 mg total) by mouth daily.  90 tablet  3    No Known Allergies  Family History  Problem Relation Age of Onset  . Cancer Neg Hx     BP 132/62  Pulse 70  Temp(Src) 97.7 F (36.5 C) (Oral)  Ht 6' (1.829 m)  Wt 231 lb 6 oz (104.951 kg)  BMI 31.38 kg/m2  SpO2 97%   Review of Systems He says he needs to increase the buspar, due to anxiety.  denies hypoglycemia.    Objective:   Physical Exam VITAL SIGNS:  See vs page.  GENERAL: no distress.  SKIN:  Insulin injection and continuous glucose monitor sites at the anterior abdomen are normal.    Lab Results  Component Value Date  HGBA1C 9.5* 04/29/2011  We have reviewed cgm data from the [employer] website together    Assessment & Plan:  DM, needs increased rx.  He has high risk of complications at this a1c level Anxiety, needs increased rx

## 2011-07-31 ENCOUNTER — Ambulatory Visit: Payer: Managed Care, Other (non HMO) | Admitting: Endocrinology

## 2011-08-28 ENCOUNTER — Ambulatory Visit: Payer: Managed Care, Other (non HMO) | Admitting: Endocrinology

## 2011-12-22 ENCOUNTER — Other Ambulatory Visit: Payer: Self-pay | Admitting: Endocrinology

## 2011-12-22 ENCOUNTER — Other Ambulatory Visit: Payer: Self-pay | Admitting: *Deleted

## 2011-12-22 MED ORDER — INSULIN LISPRO 100 UNIT/ML ~~LOC~~ SOLN: SUBCUTANEOUS | Status: DC

## 2011-12-22 NOTE — Telephone Encounter (Signed)
Medication refill request.

## 2012-01-06 ENCOUNTER — Other Ambulatory Visit: Payer: Self-pay | Admitting: Endocrinology

## 2012-01-18 ENCOUNTER — Ambulatory Visit (INDEPENDENT_AMBULATORY_CARE_PROVIDER_SITE_OTHER): Payer: Managed Care, Other (non HMO) | Admitting: Endocrinology

## 2012-01-18 ENCOUNTER — Encounter: Payer: Self-pay | Admitting: Endocrinology

## 2012-01-18 VITALS — BP 134/76 | HR 86 | Temp 98.1°F | Wt 227.0 lb

## 2012-01-18 DIAGNOSIS — IMO0002 Reserved for concepts with insufficient information to code with codable children: Secondary | ICD-10-CM

## 2012-01-18 DIAGNOSIS — E1065 Type 1 diabetes mellitus with hyperglycemia: Secondary | ICD-10-CM

## 2012-01-18 MED ORDER — ACCU-CHEK MULTICLIX LANCET DEV KIT: 1.0000 | PACK | Freq: Every day | Status: DC | PRN

## 2012-01-18 MED ORDER — LISINOPRIL-HYDROCHLOROTHIAZIDE 10-12.5 MG PO TABS: 1.0000 | ORAL_TABLET | Freq: Every day | ORAL | Status: DC

## 2012-01-18 MED ORDER — EZETIMIBE 10 MG PO TABS: 10.0000 mg | ORAL_TABLET | Freq: Every day | ORAL | Status: DC

## 2012-01-18 MED ORDER — "PARADIGM SILHOUETTE COMBO 43"" MISC": 1.0000 | Status: DC

## 2012-01-18 MED ORDER — INSULIN LISPRO 100 UNIT/ML ~~LOC~~ SOLN: SUBCUTANEOUS | Status: DC

## 2012-01-18 MED ORDER — PARADIGM PUMP RESERVOIR 3ML MISC: 1.0000 | Status: DC

## 2012-01-18 MED ORDER — ROSUVASTATIN CALCIUM 40 MG PO TABS: 40.0000 mg | ORAL_TABLET | Freq: Every day | ORAL | Status: DC

## 2012-01-18 MED ORDER — BUSPIRONE HCL 30 MG PO TABS: 15.0000 mg | ORAL_TABLET | Freq: Two times a day (BID) | ORAL | Status: DC

## 2012-01-18 NOTE — Progress Notes (Signed)
Subjective:    Patient ID: Kenneth Pace, male    DOB: 20-May-1950, 61 y.o.   MRN: [id number]  HPI pt returns for f/u of type 1 DM (dx'ed 1984; no known complications; he has a [employer] pump and continuous glucose monitor).  He was unable to download continuous glucose monitor date over the past few days.  he feels well in general.  He says care of his dm has been compromised by his wife's recent renal transplant.  Also, he was rx'ed with prednisone for tennis elbow.  This elevated cbg's, but it is otherwise well-controlled.  He averages approx 120 units of humalog per day, via his pump.  Pt states few years of moderate thickening of the toenails, but no assoc pain Past Medical History  Diagnosis Date  . DIABETES MELLITUS, TYPE I, UNCONTROLLED 12/29/2006  . HYPERLIPIDEMIA 02/16/2007  . ANXIETY 02/16/2007  . ERECTILE DYSFUNCTION 02/16/2007  . HYPERTENSION 02/16/2007  . TESTICULAR MASS, LEFT 02/16/2007  . ADENOIDECTOMY, HX OF 02/16/2007  . BENIGN PROSTATIC HYPERTROPHY, WITH OBSTRUCTION 02/03/2010    Past Surgical History  Procedure Date  . Tonsillectomy     History   Social History  . Marital Status: Married    Spouse Name: N/A    Number of Children: N/A  . 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 Smoking status: Never Smoker   . Smokeless tobacco: Not on file  . Alcohol Use: Not on file  . Drug Use: Not on file  . Sexually Active: Not on file   Other Topics Concern  . Not on file   Social History Narrative  . No narrative on file    Current Outpatient Prescriptions on File Prior to Visit  Medication Sig Dispense Refill  . aspirin 81 MG tablet Take 81 mg by mouth daily.        Marland Kitchen co-enzyme Q-10 30 MG capsule Take 30 mg by mouth daily.        . Continuous Glucose Monitor Sup (SOF-SENSOR) MISC Change 2/week       . Cranberry 400 MG TABS Take 2 each by mouth daily.        . Cyanocobalamin  (VITAMIN B-12 CR PO) Take 1 each by mouth daily.        Marland Kitchen glucose blood (ONE TOUCH ULTRA TEST) test strip 5/day dx 250.01, and lancets  450 each  3  . Multiple Vitamins-Minerals (MULTIVITAMIN,TX-MINERALS) tablet Take 1 tablet by mouth daily.        . sildenafil (VIAGRA) 100 MG tablet Take 1 tablet (100 mg total) by mouth as needed for erectile dysfunction.  12 tablet  5  . [DISCONTINUED] insulin lispro (HUMALOG) 100 UNIT/ML injection Use in pump, 110 units daily  12 mL  3    No Known Allergies  Family History  Problem Relation Age of Onset  . Cancer Neg Hx     BP 134/76  Pulse 86  Temp 98.1 F (36.7 C) (Oral)  Wt 227 lb (102.967 kg)  SpO2 97%    Review of Systems denies hypoglycemia and weight change    Objective:   Physical Exam VITAL SIGNS:  See vs page GENERAL: no distress SKIN:  Insulin infusion sites and continuous glucose monitor at the anterior abdomen are normal Pulses: dorsalis pedis intact bilat.   Feet: no deformity.  no ulcer on the feet.  feet are of normal color and  temp.  no edema.  onychomycosis of toenails Neuro: sensation is intact to touch on the feet     (pt declines labs for today)    Assessment & Plan:  DM, uncertain control Onychomycosis, new.  Pt wishes to defer rx until he can get LFT done with upcoming CPX.

## 2012-01-18 NOTE — Patient Instructions (Addendum)
Please continue basal rate of 2 units/hr.  continue bolus of1 unit/3 grams carbohydrate, except subtract 15 units from lunch bolus, and add 10 units to supper bolus.   continue correction bolus (which some people call "sensitivity," or "insulin sensitivity ratio," or just "isr") of 1 unit for each 50 by which your glucose exceeds 100.  Please come back for a regular physical appointment in january.   Please continue to work with [employer] about uploading continuous glucose monitor information.

## 2012-03-28 ENCOUNTER — Other Ambulatory Visit: Payer: Managed Care, Other (non HMO)

## 2012-03-28 ENCOUNTER — Other Ambulatory Visit: Payer: Self-pay

## 2012-03-28 ENCOUNTER — Other Ambulatory Visit (INDEPENDENT_AMBULATORY_CARE_PROVIDER_SITE_OTHER): Payer: Managed Care, Other (non HMO)

## 2012-03-28 DIAGNOSIS — Z0000 Encounter for general adult medical examination without abnormal findings: Secondary | ICD-10-CM

## 2012-03-28 DIAGNOSIS — IMO0002 Reserved for concepts with insufficient information to code with codable children: Secondary | ICD-10-CM

## 2012-03-28 DIAGNOSIS — E1065 Type 1 diabetes mellitus with hyperglycemia: Secondary | ICD-10-CM

## 2012-03-28 LAB — CBC WITH DIFFERENTIAL/PLATELET
Basophils Absolute: 0 10*3/uL (ref 0.0–0.1)
Lymphocytes Relative: 29 % (ref 12.0–46.0)
Lymphs Abs: 1.8 10*3/uL (ref 0.7–4.0)
Monocytes Relative: 10.3 % (ref 3.0–12.0)
Neutrophils Relative %: 56.9 % (ref 43.0–77.0)
Platelets: 180 10*3/uL (ref 150.0–400.0)
RDW: 12.9 % (ref 11.5–14.6)
WBC: 6.4 10*3/uL (ref 4.5–10.5)

## 2012-03-28 LAB — BASIC METABOLIC PANEL
BUN: 23 mg/dL (ref 6–23)
Calcium: 9.1 mg/dL (ref 8.4–10.5)
Creatinine, Ser: 0.8 mg/dL (ref 0.4–1.5)
GFR: 100.56 mL/min (ref >60.00)
Potassium: 3.9 mEq/L (ref 3.5–5.1)

## 2012-03-28 LAB — URINALYSIS, ROUTINE W REFLEX MICROSCOPIC
Nitrite: NEGATIVE
Specific Gravity, Urine: 1.015 (ref 1.000–1.030)
Total Protein, Urine: NEGATIVE
pH: 6 (ref 5.0–8.0)

## 2012-03-28 LAB — HEPATIC FUNCTION PANEL
AST: 32 U/L (ref 0–37)
Bilirubin, Direct: 0.2 mg/dL (ref 0.0–0.3)
Total Bilirubin: 1 mg/dL (ref 0.3–1.2)

## 2012-03-28 LAB — PSA: PSA: 2.24 ng/mL (ref 0.10–4.00)

## 2012-03-28 LAB — TSH: TSH: 1.32 u[IU]/mL (ref 0.35–5.50)

## 2012-04-05 ENCOUNTER — Ambulatory Visit (INDEPENDENT_AMBULATORY_CARE_PROVIDER_SITE_OTHER): Payer: BC Managed Care – PPO | Admitting: Endocrinology

## 2012-04-05 ENCOUNTER — Encounter: Payer: Self-pay | Admitting: Endocrinology

## 2012-04-05 VITALS — BP 122/78 | HR 80 | Wt 228.0 lb

## 2012-04-05 DIAGNOSIS — E1065 Type 1 diabetes mellitus with hyperglycemia: Secondary | ICD-10-CM

## 2012-04-05 DIAGNOSIS — Z0000 Encounter for general adult medical examination without abnormal findings: Secondary | ICD-10-CM

## 2012-04-05 MED ORDER — SOF-SENSOR MISC: 1.0000 | Status: DC

## 2012-04-05 MED ORDER — TERBINAFINE HCL 250 MG PO TABS: 250.0000 mg | ORAL_TABLET | Freq: Every day | ORAL | Status: DC

## 2012-04-05 NOTE — Patient Instructions (Addendum)
Here is a prescription, for the toenail fungus. Please continue basal rate of 2 units/hr.  Please increase bolus to 1 unit/ 2 grams carbohydrate.    continue correction bolus (which some people call "sensitivity," or "insulin sensitivity ratio," or just "isr") of 1 unit for each 50 by which your glucose exceeds 100.  please consider these measures for your health:  minimize alcohol.  do not use tobacco products.  have a colonoscopy at least every 10 years from age 64. keep firearms safely stored.  always use seat belts.  have working smoke alarms in your home.  see an eye doctor and dentist regularly.  never drive under the influence of alcohol or drugs (including prescription drugs).  those with fair skin should take precautions against the sun. Please come back for a follow-up appointment in 3 months.

## 2012-04-05 NOTE — Progress Notes (Signed)
Subjective:    Patient ID: Kenneth Pace, male    DOB: 22-Apr-1950, 62 y.o.   MRN: [id number]  HPI here for regular wellness examination.  He's feeling pretty well in general, and says chronic med probs are stable, except as noted below Past Medical History  Diagnosis Date  . DIABETES MELLITUS, TYPE I, UNCONTROLLED 12/29/2006  . HYPERLIPIDEMIA 02/16/2007  . ANXIETY 02/16/2007  . ERECTILE DYSFUNCTION 02/16/2007  . HYPERTENSION 02/16/2007  . TESTICULAR MASS, LEFT 02/16/2007  . ADENOIDECTOMY, HX OF 02/16/2007  . BENIGN PROSTATIC HYPERTROPHY, WITH OBSTRUCTION 02/03/2010    Past Surgical History  Procedure Laterality Date  . Tonsillectomy      History   Social History  . Marital Status: Married    Spouse Name: N/A    Number of Children: N/A  . 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 Smoking status: Never Smoker   . Smokeless tobacco: Not on file  . Alcohol Use: Not on file  . Drug Use: Not on file  . Sexually Active: Not on file   Other Topics Concern  . Not on file   Social History Narrative  . No narrative on file    Current Outpatient Prescriptions on File Prior to Visit  Medication Sig Dispense Refill  . aspirin 81 MG tablet Take 81 mg by mouth daily.        . busPIRone (BUSPAR) 30 MG tablet Take 0.5 tablets (15 mg total) by mouth 2 (two) times daily.  180 tablet  3  . co-enzyme Q-10 30 MG capsule Take 30 mg by mouth daily.        . Cranberry 400 MG TABS Take 2 each by mouth daily.        . Cyanocobalamin (VITAMIN B-12 CR PO) Take 1 each by mouth daily.        Marland Kitchen ezetimibe (ZETIA) 10 MG tablet Take 1 tablet (10 mg total) by mouth daily.  90 tablet  3  . glucose blood (ONE TOUCH ULTRA TEST) test strip 5/day dx 250.01, and lancets  450 each  3  . Insulin Infusion Pump Supplies (PARADIGM RESERVOIR ) MISC 1 Device by Does not apply route every 3 (three) days.  30 each  3  . Insulin  Infusion Pump Supplies (PARADIGM SILHOUETTE COMBO 43") MISC 1 Device by Does not apply route every 3 (three) days.  30 each  3  . insulin lispro (HUMALOG) 100 UNIT/ML injection Use in pump, 120 units daily  120 mL  3  . Lancets Misc. (ACCU-CHEK MULTICLIX LANCET DEV) KIT 1 Device by Other route 5 (five) times daily as needed. 5/day dx 250.01  450 each  3  . lisinopril-hydrochlorothiazide (PRINZIDE,ZESTORETIC) 10-12.5 MG per tablet Take 1 tablet by mouth daily.  90 tablet  3  . Multiple Vitamins-Minerals (MULTIVITAMIN,TX-MINERALS) tablet Take 1 tablet by mouth daily.        . rosuvastatin (CRESTOR) 40 MG tablet Take 1 tablet (40 mg total) by mouth daily.  90 tablet  3  . sildenafil (VIAGRA) 100 MG tablet Take 1 tablet (100 mg total) by mouth as needed for erectile dysfunction.  12 tablet  5   No current facility-administered medications on file prior to visit.    No Known Allergies  Family History  Problem Relation Age of Onset  . Cancer Neg Hx     BP 122/78  Pulse 80  Wt  228 lb (103.42 kg)  BMI 30.92 kg/m2  SpO2 97%     Review of Systems  Constitutional: Negative for fever.  HENT: Negative for hearing loss.   Eyes: Negative for visual disturbance.  Respiratory: Negative for shortness of breath.   Cardiovascular: Negative for chest pain.  Gastrointestinal: Negative for anal bleeding.  Endocrine: Negative for polyuria.  Genitourinary: Negative for hematuria.  Musculoskeletal: Negative for back pain.  Skin: Negative for rash.  Allergic/Immunologic: Negative for environmental allergies.  Neurological: Negative for syncope and numbness.  Hematological: Does not bruise/bleed easily.  Psychiatric/Behavioral: Negative for dysphoric mood.       Objective:   Physical Exam VS: see vs page GEN: no distress HEAD: head: no deformity eyes: no periorbital swelling, no proptosis external nose and ears are normal mouth: no lesion seen NECK: supple, thyroid is not enlarged CHEST  WALL: no deformity LUNGS: clear to auscultation BREASTS:  No gynecomastia CV: reg rate and rhythm, no murmur ABD: abdomen is soft, nontender.  no hepatosplenomegaly.  not distended.  no hernia GENITALIA/RECTAL/PROSTATE:  sees urology MUSCULOSKELETAL: muscle bulk and strength are grossly normal.  no obvious joint swelling.  gait is normal and steady EXTEMITIES: no deformity.  no ulcer on the feet.  feet are of normal color and temp.  no edema PULSES: dorsalis pedis intact bilat.  no carotid bruit NEURO:  cn 2-12 grossly intact.   readily moves all 4's.  sensation is intact to touch on the feet SKIN:  Normal texture and temperature.  No rash or suspicious lesion is visible.   NODES:  None palpable at the neck PSYCH: alert, oriented x3.  Does not appear anxious nor depressed.     Assessment & Plan:  Wellness visit today, with problems stable, except as noted.  we discussed code status.  pt requests full code, but would not want to be started or maintained on artificial life-support measures if there was not a reasonable chance of recovery    SEPARATE EVALUATION FOLLOWS--EACH PROBLEM HERE IS NEW, NOT RESPONDING TO TREATMENT, OR POSES SIGNIFICANT RISK TO THE PATIENT'S HEALTH: HISTORY OF THE PRESENT ILLNESS: Pt states few years of moderate thickening of the toenails, but no assoc pain. pt returns for f/u of type 1 DM (dx'ed 1984; no known complications; he has a medtronic pump and continuous glucose monitor).  We downloaded continuous glucose monitor data, and we reviewed together.  These data indicate he still has high postprandial glucoses throughout the day.   PAST MEDICAL HISTORY reviewed and up to date today REVIEW OF SYSTEMS: denies hypoglycemia and weight change PHYSICAL EXAMINATION: Pulses: dorsalis pedis intact bilat.   Feet: no deformity.  no ulcer on the feet.  feet are of normal color and temp.  no edema.  There is bilateral onychomycosis, and and old healed surgical scar on the  left foot (bunionectomy) Neuro: sensation is intact to touch on the feet VITAL SIGNS:  See vs page GENERAL: no distress LAB/XRAY RESULTS: Lab Results  Component Value Date   HGBA1C 8.6* 03/28/2012  IMPRESSION: Onychomycosis, new DM: needs increased rx PLAN: See instruction page

## 2012-04-10 DIAGNOSIS — Z0000 Encounter for general adult medical examination without abnormal findings: Secondary | ICD-10-CM | POA: Insufficient documentation

## 2012-05-03 ENCOUNTER — Encounter: Payer: Self-pay | Admitting: Endocrinology

## 2012-06-03 DIAGNOSIS — N529 Male erectile dysfunction, unspecified: Secondary | ICD-10-CM | POA: Insufficient documentation

## 2012-06-16 DIAGNOSIS — E119 Type 2 diabetes mellitus without complications: Secondary | ICD-10-CM | POA: Insufficient documentation

## 2012-07-12 ENCOUNTER — Ambulatory Visit (INDEPENDENT_AMBULATORY_CARE_PROVIDER_SITE_OTHER): Payer: BC Managed Care – PPO | Admitting: Endocrinology

## 2012-07-12 ENCOUNTER — Encounter: Payer: Self-pay | Admitting: Endocrinology

## 2012-07-12 VITALS — BP 124/78 | HR 80 | Ht 71.0 in | Wt 228.0 lb

## 2012-07-12 DIAGNOSIS — E1065 Type 1 diabetes mellitus with hyperglycemia: Secondary | ICD-10-CM

## 2012-07-12 DIAGNOSIS — IMO0002 Reserved for concepts with insufficient information to code with codable children: Secondary | ICD-10-CM

## 2012-07-12 NOTE — Patient Instructions (Addendum)
Please continue basal rate of 2 units/hr.  Please increase bolus to 1 unit/ 2 grams carbohydrate.      continue correction bolus (which some people call "sensitivity," or "insulin sensitivity ratio," or just "isr") of 1 unit for each 50 by which your glucose exceeds 100.  Please come back for a follow-up appointment in 3 months.  Please upload continuous glucose monitor data in advance of the visit.   blood tests are being requested for you today.  We'll contact you with results.

## 2012-07-12 NOTE — Progress Notes (Signed)
Subjective:    Patient ID: Kenneth Pace, male    DOB: [birth year], 62 y.o.   MRN: [id number]  HPI pt returns for f/u of type 1 DM (dx'ed 1984; he has mild if any neuropathy of the lower extremities;  No other known associated complications; he has a medtronic pump and continuous glucose monitor).  He was unable to download continuous glucose monitor data for this ov, due to a problem with his home computer.  He says cbg's are highest in am.  pt states he feels well in general.   Past Medical History  Diagnosis Date  . DIABETES MELLITUS, TYPE I, UNCONTROLLED 12/29/2006  . HYPERLIPIDEMIA 02/16/2007  . ANXIETY 02/16/2007  . ERECTILE DYSFUNCTION 02/16/2007  . HYPERTENSION 02/16/2007  . TESTICULAR MASS, LEFT 02/16/2007  . ADENOIDECTOMY, HX OF 02/16/2007  . BENIGN PROSTATIC HYPERTROPHY, WITH OBSTRUCTION 02/03/2010    Past Surgical History  Procedure Laterality Date  . Tonsillectomy      History   Social History  . Marital Status: Married    Spouse Name: N/A    Number of Children: N/A  . 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 Smoking status: Never Smoker   . Smokeless tobacco: Not on file  . Alcohol Use: Not on file  . Drug Use: Not on file  . Sexually Active: Not on file   Other Topics Concern  . Not on file   Social History Narrative  . No narrative on file    Current Outpatient Prescriptions on File Prior to Visit  Medication Sig Dispense Refill  . aspirin 81 MG tablet Take 81 mg by mouth daily.        . busPIRone (BUSPAR) 30 MG tablet Take 0.5 tablets (15 mg total) by mouth 2 (two) times daily.  180 tablet  3  . co-enzyme Q-10 30 MG capsule Take 30 mg by mouth daily.        . Continuous Glucose Monitor Sup (SOF-SENSOR) MISC 1 Device by Does not apply route 2 (two) times a week.  30 each  5  . Cranberry 400 MG TABS Take 2 each by mouth daily.        . Cyanocobalamin (VITAMIN B-12 CR PO) Take  1 each by mouth daily.        Marland Kitchen ezetimibe (ZETIA) 10 MG tablet Take 1 tablet (10 mg total) by mouth daily.  90 tablet  3  . glucose blood (ONE TOUCH ULTRA TEST) test strip 5/day dx 250.01, and lancets  450 each  3  . Insulin Infusion Pump Supplies (PARADIGM RESERVOIR ) MISC 1 Device by Does not apply route every 3 (three) days.  30 each  3  . Insulin Infusion Pump Supplies (PARADIGM SILHOUETTE COMBO 43") MISC 1 Device by Does not apply route every 3 (three) days.  30 each  3  . insulin lispro (HUMALOG) 100 UNIT/ML injection Use in pump, 120 units daily  120 mL  3  . Lancets Misc. (ACCU-CHEK MULTICLIX LANCET DEV) KIT 1 Device by Other route 5 (five) times daily as needed. 5/day dx 250.01  450 each  3  . lisinopril-hydrochlorothiazide (PRINZIDE,ZESTORETIC) 10-12.5 MG per tablet Take 1 tablet by mouth daily.  90 tablet  3  . Multiple Vitamins-Minerals (MULTIVITAMIN,TX-MINERALS) tablet Take 1 tablet by mouth daily.        . rosuvastatin (CRESTOR) 40 MG tablet Take 1 tablet (40 mg  total) by mouth daily.  90 tablet  3  . terbinafine (LAMISIL) 250 MG tablet Take 1 tablet (250 mg total) by mouth daily.  90 tablet  0  . sildenafil (VIAGRA) 100 MG tablet Take 1 tablet (100 mg total) by mouth as needed for erectile dysfunction.  12 tablet  5   No current facility-administered medications on file prior to visit.   No Known Allergies  Family History  Problem Relation Age of Onset  . Cancer Neg Hx    BP 124/78  Pulse 80  Ht 5\' 11"  (1.803 m)  Wt 228 lb (103.42 kg)  BMI 31.81 kg/m2  SpO2 97%  Review of Systems denies hypoglycemia and weight change.    Objective:   Physical Exam VITAL SIGNS:  See vs page.   GENERAL: no distress.       Assessment & Plan:  DM: therapy limited by lack of continuous glucose monitor data.  This insulin pump regimen was chosen from multiple options, as it best matches his insulin to his changing requirements throughout the day.  The value of control has to be  weight against the risks of hypoglycemia.

## 2012-10-12 ENCOUNTER — Ambulatory Visit: Payer: BC Managed Care – PPO | Admitting: Endocrinology

## 2012-10-20 ENCOUNTER — Ambulatory Visit: Payer: BC Managed Care – PPO | Admitting: Endocrinology

## 2012-12-08 ENCOUNTER — Encounter: Payer: Self-pay | Admitting: Endocrinology

## 2012-12-08 ENCOUNTER — Ambulatory Visit (INDEPENDENT_AMBULATORY_CARE_PROVIDER_SITE_OTHER): Payer: BC Managed Care – PPO | Admitting: Endocrinology

## 2012-12-08 VITALS — BP 126/80 | HR 80 | Wt 231.0 lb

## 2012-12-08 DIAGNOSIS — E1039 Type 1 diabetes mellitus with other diabetic ophthalmic complication: Secondary | ICD-10-CM

## 2012-12-08 DIAGNOSIS — Z23 Encounter for immunization: Secondary | ICD-10-CM

## 2012-12-08 DIAGNOSIS — E1065 Type 1 diabetes mellitus with hyperglycemia: Secondary | ICD-10-CM

## 2012-12-08 LAB — HEMOGLOBIN A1C: Hgb A1c MFr Bld: 8 % — ABNORMAL HIGH (ref 4.6–6.5)

## 2012-12-08 MED ORDER — SOF-SENSOR MISC: 1.0000 | Status: DC

## 2012-12-08 MED ORDER — LISINOPRIL-HYDROCHLOROTHIAZIDE 10-12.5 MG PO TABS: 1.0000 | ORAL_TABLET | Freq: Every day | ORAL | Status: DC

## 2012-12-08 MED ORDER — "PARADIGM SILHOUETTE COMBO 43"" MISC": 1.0000 | Status: DC

## 2012-12-08 MED ORDER — TEGADERM HP MISC: Status: DC

## 2012-12-08 MED ORDER — TRAMADOL HCL 50 MG PO TABS: 50.0000 mg | ORAL_TABLET | Freq: Four times a day (QID) | ORAL | Status: DC | PRN

## 2012-12-08 MED ORDER — EZETIMIBE 10 MG PO TABS: 10.0000 mg | ORAL_TABLET | Freq: Every day | ORAL | Status: DC

## 2012-12-08 MED ORDER — PARADIGM PUMP RESERVOIR 3ML MISC: 1.0000 | Status: DC

## 2012-12-08 MED ORDER — BUSPIRONE HCL 30 MG PO TABS: 15.0000 mg | ORAL_TABLET | Freq: Two times a day (BID) | ORAL | Status: DC

## 2012-12-08 MED ORDER — MISC. DEVICES MISC: Status: DC

## 2012-12-08 MED ORDER — GLUCOSE BLOOD VI STRP: ORAL_STRIP | Status: DC

## 2012-12-08 MED ORDER — ROSUVASTATIN CALCIUM 40 MG PO TABS: 40.0000 mg | ORAL_TABLET | Freq: Every day | ORAL | Status: DC

## 2012-12-08 NOTE — Patient Instructions (Addendum)
Please come back for a regular physical appointment after 04/05/13. Please continue basal rate of 2 units/hr.  Please increase bolus to 1 unit/ 2 grams carbohydrate.    continue correction bolus (which some people call "sensitivity," or "insulin sensitivity ratio," or just "isr") of 1 unit for each 50 by which your glucose exceeds 100.  Please continue to upload continuous glucose monitor data in advance of you visits.   blood tests are being requested for you today.  We'll contact you with results.   i have sent a prescription to your pharmacy, for tramadol.

## 2012-12-08 NOTE — Progress Notes (Signed)
Subjective:    Patient ID: Kenneth Pace, male    DOB: November 16, 1950, 62 y.o.   MRN: [id number]  HPI pt returns for f/u of type 1 DM (dx'ed 1984; he has mild if any neuropathy of the lower extremities;  he has associated non-proliferative retinopathy; he has a medtronic pump and continuous glucose monitor).  He does not know his total daily insulin dosage.  Aside from the ROS below. pt states he feels well in general.  We reviewed continuous glucose monitor data together.   Past Medical History  Diagnosis Date  . DIABETES MELLITUS, TYPE I, UNCONTROLLED 12/29/2006  . HYPERLIPIDEMIA 02/16/2007  . ANXIETY 02/16/2007  . ERECTILE DYSFUNCTION 02/16/2007  . HYPERTENSION 02/16/2007  . TESTICULAR MASS, LEFT 02/16/2007  . ADENOIDECTOMY, HX OF 02/16/2007  . BENIGN PROSTATIC HYPERTROPHY, WITH OBSTRUCTION 02/03/2010    Past Surgical History  Procedure Laterality Date  . Tonsillectomy      History   Social History  . Marital Status: Married    Spouse Name: N/A    Number of Children: N/A  . 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 Smoking status: Never Smoker   . Smokeless tobacco: Not on file  . Alcohol Use: Not on file  . Drug Use: Not on file  . Sexual Activity: Not on file   Other Topics Concern  . Not on file   Social History Narrative  . No narrative on file    Current Outpatient Prescriptions on File Prior to Visit  Medication Sig Dispense Refill  . aspirin 81 MG tablet Take 81 mg by mouth daily.        Marland Kitchen co-enzyme Q-10 30 MG capsule Take 30 mg by mouth daily.        . Cranberry 400 MG TABS Take 2 each by mouth daily.        . Cyanocobalamin (VITAMIN B-12 CR PO) Take 1 each by mouth daily.        . insulin lispro (HUMALOG) 100 UNIT/ML injection Use in pump, 120 units daily  120 mL  3  . Lancets Misc. (ACCU-CHEK MULTICLIX LANCET DEV) KIT 1 Device by Other route 5 (five) times daily as needed. 5/day dx  250.01  450 each  3  . Multiple Vitamins-Minerals (MULTIVITAMIN,TX-MINERALS) tablet Take 1 tablet by mouth daily.        Marland Kitchen terbinafine (LAMISIL) 250 MG tablet Take 1 tablet (250 mg total) by mouth daily.  90 tablet  0  . sildenafil (VIAGRA) 100 MG tablet Take 1 tablet (100 mg total) by mouth as needed for erectile dysfunction.  12 tablet  5   No current facility-administered medications on file prior to visit.    No Known Allergies  Family History  Problem Relation Age of Onset  . Cancer Neg Hx    BP 126/80  Pulse 80  Wt 231 lb (104.781 kg)  BMI 32.23 kg/m2  SpO2 97%  Review of Systems He has pain at the plantar aspect of the right foot.  He saw dr Darrelyn Hillock for tennis elbow.  The pain persists    Objective:   Physical Exam VITAL SIGNS:  See vs page GENERAL: no distress  Lab Results  Component Value Date   HGBA1C 8.0* 12/08/2012      Assessment & Plan:  DM: This insulin pump regimen was chosen from multiple options, as it best matches his insulin to  his changing requirements throughout the day.  The value of control has to be weight against the risks of hypoglycemia.  He needs increased rx, although i do not see any trend throughout the day on the uploaded continuous glucose monitor data Elbow pain, persistent foot pain, new, uncertain etiology.  Pt declines ref to podiatry and x-ray today.

## 2013-01-25 ENCOUNTER — Other Ambulatory Visit: Payer: Self-pay | Admitting: Endocrinology

## 2013-01-30 ENCOUNTER — Telehealth: Payer: Self-pay

## 2013-01-30 MED ORDER — INSULIN LISPRO 100 UNIT/ML ~~LOC~~ SOLN: SUBCUTANEOUS | Status: DC

## 2013-01-30 NOTE — Telephone Encounter (Signed)
Yes, please resend with correct quantity

## 2013-01-30 NOTE — Telephone Encounter (Signed)
Patients wife called stating that the script that was [facility] was only sent for 1 vial for a 90 day supply. Our records indicated that the script was for 120 ml with 3 refills.   Ok to resend fax? Before I send fax again I will call [facility] and talk to someone about this.  Thanks!

## 2013-01-30 NOTE — Telephone Encounter (Signed)
Refilled

## 2013-04-05 ENCOUNTER — Ambulatory Visit: Payer: BC Managed Care – PPO | Admitting: Endocrinology

## 2013-04-24 ENCOUNTER — Ambulatory Visit (INDEPENDENT_AMBULATORY_CARE_PROVIDER_SITE_OTHER): Payer: BC Managed Care – PPO | Admitting: Endocrinology

## 2013-04-24 ENCOUNTER — Encounter: Payer: Self-pay | Admitting: Endocrinology

## 2013-04-24 ENCOUNTER — Other Ambulatory Visit: Payer: Self-pay

## 2013-04-24 VITALS — BP 130/82 | HR 83 | Temp 98.0°F | Ht 71.0 in | Wt 234.0 lb

## 2013-04-24 DIAGNOSIS — Z125 Encounter for screening for malignant neoplasm of prostate: Secondary | ICD-10-CM | POA: Insufficient documentation

## 2013-04-24 DIAGNOSIS — Z23 Encounter for immunization: Secondary | ICD-10-CM

## 2013-04-24 DIAGNOSIS — E1065 Type 1 diabetes mellitus with hyperglycemia: Secondary | ICD-10-CM

## 2013-04-24 DIAGNOSIS — IMO0002 Reserved for concepts with insufficient information to code with codable children: Secondary | ICD-10-CM

## 2013-04-24 DIAGNOSIS — Z0000 Encounter for general adult medical examination without abnormal findings: Secondary | ICD-10-CM

## 2013-04-24 DIAGNOSIS — E1039 Type 1 diabetes mellitus with other diabetic ophthalmic complication: Secondary | ICD-10-CM

## 2013-04-24 DIAGNOSIS — E785 Hyperlipidemia, unspecified: Secondary | ICD-10-CM

## 2013-04-24 DIAGNOSIS — I10 Essential (primary) hypertension: Secondary | ICD-10-CM

## 2013-04-24 DIAGNOSIS — Z2911 Encounter for prophylactic immunotherapy for respiratory syncytial virus (RSV): Secondary | ICD-10-CM

## 2013-04-24 MED ORDER — TERBINAFINE HCL 250 MG PO TABS: 250.0000 mg | ORAL_TABLET | Freq: Every day | ORAL | Status: DC

## 2013-04-24 MED ORDER — EZETIMIBE 10 MG PO TABS: 10.0000 mg | ORAL_TABLET | Freq: Every day | ORAL | Status: DC

## 2013-04-24 MED ORDER — SOF-SENSOR MISC: 1.0000 | Status: DC

## 2013-04-24 MED ORDER — GLUCOSE BLOOD VI STRP: ORAL_STRIP | Status: DC

## 2013-04-24 MED ORDER — BUSPIRONE HCL 30 MG PO TABS: 15.0000 mg | ORAL_TABLET | Freq: Two times a day (BID) | ORAL | Status: DC

## 2013-04-24 NOTE — Patient Instructions (Addendum)
please consider these measures for your health:  minimize alcohol.  do not use tobacco products.  have a colonoscopy at least every 10 years from age 61.  keep firearms safely stored.  always use seat belts.  have working smoke alarms in your home.  see an eye doctor and dentist regularly.  never drive under the influence of alcohol or drugs (including prescription drugs).  those with fair skin should take precautions against the sun. Please come back for a follow-up appointment in 3 months.   blood tests are being requested for you today.  We'll contact you with results.

## 2013-04-24 NOTE — Progress Notes (Signed)
Subjective:    Patient ID: Kenneth Pace, male    DOB: 09-06-1950, 63 y.o.   MRN: [id number]  HPI Pt is here for regular wellness examination, and is feeling pretty well in general, and says chronic med probs are stable, except as noted below Past Medical History  Diagnosis Date  . DIABETES MELLITUS, TYPE I, UNCONTROLLED 12/29/2006  . HYPERLIPIDEMIA 02/16/2007  . ANXIETY 02/16/2007  . ERECTILE DYSFUNCTION 02/16/2007  . HYPERTENSION 02/16/2007  . TESTICULAR MASS, LEFT 02/16/2007  . ADENOIDECTOMY, HX OF 02/16/2007  . BENIGN PROSTATIC HYPERTROPHY, WITH OBSTRUCTION 02/03/2010    Past Surgical History  Procedure Laterality Date  . Tonsillectomy      History   Social History  . Marital Status: Married    Spouse Name: N/A    Number of Children: N/A  . 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 Smoking status: Never Smoker   . Smokeless tobacco: Not on file  . Alcohol Use: Not on file  . Drug Use: Not on file  . Sexual Activity: Not on file   Other Topics Concern  . Not on file   Social History Narrative  . No narrative on file    Current Outpatient Prescriptions on File Prior to Visit  Medication Sig Dispense Refill  . aspirin 81 MG tablet Take 81 mg by mouth daily.        Marland Kitchen co-enzyme Q-10 30 MG capsule Take 30 mg by mouth daily.        . Cranberry 400 MG TABS Take 2 each by mouth daily.        . Cyanocobalamin (VITAMIN B-12 CR PO) Take 1 each by mouth daily.        . Insulin Infusion Pump Supplies (PARADIGM RESERVOIR 3ML) MISC 1 Device by Does not apply route every 3 (three) days.  30 each  3  . Insulin Infusion Pump Supplies (PARADIGM SILHOUETTE COMBO 43") MISC 1 Device by Does not apply route every 3 (three) days.  30 each  3  . insulin lispro (HUMALOG) 100 UNIT/ML injection Use in pump, 120 units daily  120 mL  3  . Lancets Misc. (ACCU-CHEK MULTICLIX LANCET DEV) KIT 1 Device by Other route  5 (five) times daily as needed. 5/day dx 250.01  450 each  3  . lisinopril-hydrochlorothiazide (PRINZIDE,ZESTORETIC) 10-12.5 MG per tablet Take 1 tablet by mouth daily.  90 tablet  3  . lisinopril-hydrochlorothiazide (PRINZIDE,ZESTORETIC) 10-12.5 MG per tablet TAKE 1 TABLET DAILY  90 tablet  1  . Misc. Devices MISC IV 3000 infusion set cover.  Change every 3 days  30 each  3  . Multiple Vitamins-Minerals (MULTIVITAMIN,TX-MINERALS) tablet Take 1 tablet by mouth daily.        . rosuvastatin (CRESTOR) 40 MG tablet Take 1 tablet (40 mg total) by mouth daily.  90 tablet  3  . traMADol (ULTRAM) 50 MG tablet Take 1 tablet (50 mg total) by mouth every 6 (six) hours as needed for pain.  50 tablet  5  . Transparent Dressings (TEGADERM HP) MISC 4x3.  Change every 4 days  25 each  3  . sildenafil (VIAGRA) 100 MG tablet Take 1 tablet (100 mg total) by mouth as needed for erectile dysfunction.  12 tablet  5   No current facility-administered medications on file prior to visit.    No Known Allergies  Family History  Problem Relation Age of Onset  . Cancer Neg Hx     BP 130/82  Pulse 83  Temp(Src) 98 F (36.7 C) (Oral)  Ht 5' 11" (1.803 m)  Wt 234 lb (106.142 kg)  BMI 32.65 kg/m2  SpO2 94%  Review of Systems  Constitutional: Negative for fever.  HENT: Negative for hearing loss.   Eyes: Negative for visual disturbance.  Respiratory: Negative for shortness of breath.   Cardiovascular: Negative for chest pain.  Gastrointestinal: Negative for anal bleeding.  Endocrine: Negative for cold intolerance.  Genitourinary: Negative for hematuria and difficulty urinating.  Musculoskeletal: Negative for back pain.  Skin: Negative for rash.  Allergic/Immunologic: Negative for environmental allergies.  Neurological: Negative for syncope and numbness.  Hematological: Does not bruise/bleed easily.  Psychiatric/Behavioral: Negative for dysphoric mood.       Objective:   Physical Exam VS: see vs  page GEN: no distress HEAD: head: no deformity eyes: no periorbital swelling, no proptosis external nose and ears are normal mouth: no lesion seen NECK: supple, thyroid is not enlarged CHEST WALL: no deformity LUNGS: clear to auscultation BREASTS:  No gynecomastia CV: reg rate and rhythm, no murmur ABD: abdomen is soft, nontender.  no hepatosplenomegaly.  not distended.  no hernia GENITALIA/RECTAL/PROSTATE:  sees urology MUSCULOSKELETAL: muscle bulk and strength are grossly normal.  no obvious joint swelling.  gait is normal and steady PULSES: no carotid bruit NEURO:  cn 2-12 grossly intact.   readily moves all 4's.  SKIN:  Normal texture and temperature.  No rash or suspicious lesion is visible.   NODES:  None palpable at the neck PSYCH: alert, well-oriented.  Does not appear anxious nor depressed.      Assessment & Plan:  Wellness visit today, with problems stable, except as noted.     SEPARATE EVALUATION FOLLOWS--EACH PROBLEM HERE IS NEW, NOT RESPONDING TO TREATMENT, OR POSES SIGNIFICANT RISK TO THE PATIENT'S HEALTH: HISTORY OF THE PRESENT ILLNESS: pt returns for f/u of type 1 DM (dx'ed 1984, when he presented with DKA; he has had no episodes of DKA since; he has mild if any neuropathy of the lower extremities;  he has associated non-proliferative retinopathy; he has a medtronic pump and continuous glucose monitor; last episode of severe hypoglycemia was in approx 2001).  Since last ov, he has had pump problems.  He says these are resolved.  He was unable to upload recently, as he has a new computer.  no cbg record, but states cbg's are still high.  PAST MEDICAL HISTORY reviewed and up to date today. REVIEW OF SYSTEMS: Toenail fungus has recurred.  He denies hypoglycemia.  PHYSICAL EXAMINATION: VITAL SIGNS:  See vs page. GENERAL: no distress. LAB/XRAY RESULTS: Lab Results  Component Value Date   HGBA1C 8.0* 12/08/2012  IMPRESSION: DM: he needs increased rx, but he says  pump problems are resolved.  Onychomycosis, recurrent. PLAN: See instruction page.  

## 2013-07-25 ENCOUNTER — Encounter: Payer: Self-pay | Admitting: Endocrinology

## 2013-07-25 ENCOUNTER — Ambulatory Visit (INDEPENDENT_AMBULATORY_CARE_PROVIDER_SITE_OTHER): Payer: BC Managed Care – PPO | Admitting: Endocrinology

## 2013-07-25 VITALS — BP 130/74 | HR 81 | Temp 98.1°F | Ht 71.0 in | Wt 228.0 lb

## 2013-07-25 DIAGNOSIS — I10 Essential (primary) hypertension: Secondary | ICD-10-CM

## 2013-07-25 DIAGNOSIS — E785 Hyperlipidemia, unspecified: Secondary | ICD-10-CM

## 2013-07-25 DIAGNOSIS — E1039 Type 1 diabetes mellitus with other diabetic ophthalmic complication: Secondary | ICD-10-CM

## 2013-07-25 DIAGNOSIS — Z0000 Encounter for general adult medical examination without abnormal findings: Secondary | ICD-10-CM

## 2013-07-25 DIAGNOSIS — Z125 Encounter for screening for malignant neoplasm of prostate: Secondary | ICD-10-CM

## 2013-07-25 LAB — URINALYSIS, ROUTINE W REFLEX MICROSCOPIC
BILIRUBIN URINE: NEGATIVE
Hgb urine dipstick: NEGATIVE
KETONES UR: NEGATIVE
LEUKOCYTES UA: NEGATIVE
Nitrite: NEGATIVE
Total Protein, Urine: NEGATIVE
UROBILINOGEN UA: 0.2 (ref 0.0–1.0)
Urine Glucose: 500 — AB
pH: 6 (ref 5.0–8.0)

## 2013-07-25 LAB — HEPATIC FUNCTION PANEL
ALBUMIN: 4.1 g/dL (ref 3.5–5.2)
ALT: 43 U/L (ref 0–53)
AST: 25 U/L (ref 0–37)
Alkaline Phosphatase: 124 U/L — ABNORMAL HIGH (ref 39–117)
BILIRUBIN TOTAL: 0.6 mg/dL (ref 0.2–1.2)
Bilirubin, Direct: 0.2 mg/dL (ref 0.0–0.3)
Total Protein: 6.3 g/dL (ref 6.0–8.3)

## 2013-07-25 LAB — LIPID PANEL
CHOLESTEROL: 101 mg/dL (ref 0–200)
HDL: 37.8 mg/dL — AB (ref >39.00)
LDL Cholesterol: 36 mg/dL (ref 0–99)
NonHDL: 63.2
Total CHOL/HDL Ratio: 3
Triglycerides: 136 mg/dL (ref 0.0–149.0)
VLDL: 27.2 mg/dL (ref 0.0–40.0)

## 2013-07-25 LAB — PSA: PSA: 2.76 ng/mL (ref 0.10–4.00)

## 2013-07-25 LAB — CBC WITH DIFFERENTIAL/PLATELET
BASOS ABS: 0 10*3/uL (ref 0.0–0.1)
Basophils Relative: 0.4 % (ref 0.0–3.0)
EOS ABS: 0.1 10*3/uL (ref 0.0–0.7)
Eosinophils Relative: 1.6 % (ref 0.0–5.0)
HEMATOCRIT: 48.4 % (ref 39.0–52.0)
Hemoglobin: 16.3 g/dL (ref 13.0–17.0)
LYMPHS ABS: 1.7 10*3/uL (ref 0.7–4.0)
Lymphocytes Relative: 18.9 % (ref 12.0–46.0)
MCHC: 33.7 g/dL (ref 30.0–36.0)
MCV: 91 fl (ref 78.0–100.0)
Monocytes Absolute: 0.4 10*3/uL (ref 0.1–1.0)
Monocytes Relative: 4.8 % (ref 3.0–12.0)
Neutro Abs: 6.8 10*3/uL (ref 1.4–7.7)
Neutrophils Relative %: 74.3 % (ref 43.0–77.0)
Platelets: 180 10*3/uL (ref 150.0–400.0)
RBC: 5.32 Mil/uL (ref 4.22–5.81)
RDW: 13.5 % (ref 11.5–15.5)
WBC: 9.2 10*3/uL (ref 4.0–10.5)

## 2013-07-25 LAB — BASIC METABOLIC PANEL
BUN: 26 mg/dL — AB (ref 6–23)
CHLORIDE: 103 meq/L (ref 96–112)
CO2: 25 meq/L (ref 19–32)
Calcium: 9.2 mg/dL (ref 8.4–10.5)
Creatinine, Ser: 0.9 mg/dL (ref 0.4–1.5)
GFR: 86.72 mL/min (ref >60.00)
Glucose, Bld: 264 mg/dL — ABNORMAL HIGH (ref 70–99)
POTASSIUM: 3.7 meq/L (ref 3.5–5.1)
SODIUM: 137 meq/L (ref 135–145)

## 2013-07-25 LAB — TSH: TSH: 1.41 u[IU]/mL (ref 0.35–4.50)

## 2013-07-25 LAB — HEMOGLOBIN A1C: Hgb A1c MFr Bld: 8.1 % — ABNORMAL HIGH (ref 4.6–6.5)

## 2013-07-25 LAB — MICROALBUMIN / CREATININE URINE RATIO
Creatinine,U: 180.2 mg/dL
Microalb Creat Ratio: 1.2 mg/g (ref 0.0–30.0)
Microalb, Ur: 2.2 mg/dL — ABNORMAL HIGH (ref 0.0–1.9)

## 2013-07-25 MED ORDER — GLUCAGON (RDNA) 1 MG IJ KIT: 1.0000 mg | PACK | Freq: Once | INTRAMUSCULAR | Status: DC | PRN

## 2013-07-25 MED ORDER — TRAMADOL HCL 50 MG PO TABS: 50.0000 mg | ORAL_TABLET | Freq: Four times a day (QID) | ORAL | Status: DC | PRN

## 2013-07-25 NOTE — Progress Notes (Signed)
Subjective:    Patient ID: Kenneth Pace, male    DOB: [birth year], 63 y.o.   MRN: [id number]  HPI pt returns for f/u of type 1 DM (dx'ed 1984, when he presented with DKA; he has had no episodes of DKA since; he has mild if any neuropathy of the lower extremities; he has associated non-proliferative retinopathy; he has a medtronic pump and continuous glucose monitor; he has never had pancreatitis). Since last ov, he has had 3 episodes of severe hypoglycemia (near-syncope).  The last episode was 07/10/13.  Wife gave him fruit juice each time.  Both episodes happened with activity.  i downloaded the continuous glucose monitor data and we reviewed together.   Past Medical History  Diagnosis Date  . DIABETES MELLITUS, TYPE I, UNCONTROLLED 12/29/2006  . HYPERLIPIDEMIA 02/16/2007  . ANXIETY 02/16/2007  . ERECTILE DYSFUNCTION 02/16/2007  . HYPERTENSION 02/16/2007  . TESTICULAR MASS, LEFT 02/16/2007  . ADENOIDECTOMY, HX OF 02/16/2007  . BENIGN PROSTATIC HYPERTROPHY, WITH OBSTRUCTION 02/03/2010    Past Surgical History  Procedure Laterality Date  . Tonsillectomy      History   Social History  . Marital Status: Married    Spouse Name: N/A    Number of Children: N/A  . 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 Smoking status: Never Smoker   . Smokeless tobacco: Not on file  . Alcohol Use: Not on file  . Drug Use: Not on file  . Sexual Activity: Not on file   Other Topics Concern  . Not on file   Social History Narrative  . No narrative on file    Current Outpatient Prescriptions on File Prior to Visit  Medication Sig Dispense Refill  . aspirin 81 MG tablet Take 81 mg by mouth daily.        . busPIRone (BUSPAR) 30 MG tablet Take 0.5 tablets (15 mg total) by mouth 2 (two) times daily.  180 tablet  3  . co-enzyme Q-10 30 MG capsule Take 30 mg by mouth daily.        . Continuous Glucose Monitor Sup  (SOF-SENSOR) MISC 1 Device by Does not apply route 2 (two) times a week.  30 each  5  . Cranberry 400 MG TABS Take 2 each by mouth daily.        . Cyanocobalamin (VITAMIN B-12 CR PO) Take 1 each by mouth daily.        Marland Kitchen ezetimibe (ZETIA) 10 MG tablet Take 1 tablet (10 mg total) by mouth daily.  90 tablet  3  . glucose blood (ONE TOUCH ULTRA TEST) test strip 5/day dx 250.01, and lancets  450 each  3  . Insulin Infusion Pump Supplies (PARADIGM RESERVOIR 3ML) MISC 1 Device by Does not apply route every 3 (three) days.  30 each  3  . Insulin Infusion Pump Supplies (PARADIGM SILHOUETTE COMBO 43") MISC 1 Device by Does not apply route every 3 (three) days.  30 each  3  . insulin lispro (HUMALOG) 100 UNIT/ML injection Use in pump, 120 units daily  120 mL  3  . Lancets Misc. (ACCU-CHEK MULTICLIX LANCET DEV) KIT 1 Device by Other route 5 (five) times daily as needed. 5/day dx 250.01  450 each  3  . lisinopril-hydrochlorothiazide (PRINZIDE,ZESTORETIC) 10-12.5 MG per tablet Take 1 tablet by mouth daily.  90 tablet  3  . Misc. Devices MISC  IV 3000 infusion set cover.  Change every 3 days  30 each  3  . Multiple Vitamins-Minerals (MULTIVITAMIN,TX-MINERALS) tablet Take 1 tablet by mouth daily.        . rosuvastatin (CRESTOR) 40 MG tablet Take 1 tablet (40 mg total) by mouth daily.  90 tablet  3  . sildenafil (VIAGRA) 100 MG tablet Take 1 tablet (100 mg total) by mouth as needed for erectile dysfunction.  12 tablet  5  . terbinafine (LAMISIL) 250 MG tablet Take 1 tablet (250 mg total) by mouth daily.  90 tablet  0  . Transparent Dressings (TEGADERM HP) MISC 4x3.  Change every 4 days  25 each  3   No current facility-administered medications on file prior to visit.    No Known Allergies  Family History  Problem Relation Age of Onset  . Cancer Neg Hx     BP 130/74  Pulse 81  Temp(Src) 98.1 F (36.7 C) (Oral)  Ht _0  (1.803 m)  Wt 228 lb (103.42 kg)  BMI 31.81 kg/m2  SpO2 95%   Review of  Systems Low-back pain persists (he is on prednisone for this).  He also has insomnia, which he treats with tramadol.  Pt says he does not have anxiety.      Objective:   Physical Exam VITAL GNS:  See vs page GENERAL: no distress Psych: anxious and very talkative.    Lab Results  Component Value Date   HGBA1C 8.1* 07/25/2013       Assessment & Plan:  Anxiety: severe.  This is compromising his ability to care for his DM.  He is unwilling to acknowledge this, and he thus declines additional therapy. Insomnia: new.  i refilled the tramadol, to get him whatever therapy he'll accept. DM: moderate exacerbation, due to the above.  i advised changing the pump to a simpler insulin regimen.  He says he'll consider.  Patient is advised the following: Patient Instructions  blood tests are being requested for you today.  We'll contact you with results.  i have sent a prescription to your pharmacy, for a medication called "glucagon."  This is a single-use emergency kit.  It will help you when your blood sugar is so low, you need someone else to help you.  The side-effect is nausea, so you should be turned on your side after getting this shot.  Please come back for a follow-up appointment in 3 months.

## 2013-07-25 NOTE — Patient Instructions (Addendum)
blood tests are being requested for you today.  We'll contact you with results.  i have sent a prescription to your pharmacy, for a medication called "glucagon."  This is a single-use emergency kit.  It will help you when your blood sugar is so low, you need someone else to help you.  The side-effect is nausea, so you should be turned on your side after getting this shot.  Please come back for a follow-up appointment in 3 months.

## 2013-07-28 ENCOUNTER — Telehealth: Payer: Self-pay

## 2013-07-28 NOTE — Telephone Encounter (Signed)
Pt's wife called. She states that the pt coming off his pump will not be an option due to the traveling he does with his job. Wife relayed that pt is currently taking prednisone for sciatica and his blood sugars have been elevated because of the medication. Wanted MD to be aware.  Pt would like to know what his other options are?  Thanks!

## 2013-07-28 NOTE — Telephone Encounter (Signed)
Ok, please continue for now.

## 2013-08-02 NOTE — Telephone Encounter (Signed)
Called and lvm advising pt/pt's wife to please continue for now on the pump.

## 2013-09-26 DIAGNOSIS — H029 Unspecified disorder of eyelid: Secondary | ICD-10-CM | POA: Insufficient documentation

## 2013-10-30 ENCOUNTER — Ambulatory Visit (INDEPENDENT_AMBULATORY_CARE_PROVIDER_SITE_OTHER): Payer: BC Managed Care – PPO | Admitting: Endocrinology

## 2013-10-30 ENCOUNTER — Encounter: Payer: Self-pay | Admitting: Endocrinology

## 2013-10-30 VITALS — BP 126/70 | HR 70 | Temp 97.7°F | Ht 71.0 in | Wt 237.0 lb

## 2013-10-30 DIAGNOSIS — E1039 Type 1 diabetes mellitus with other diabetic ophthalmic complication: Secondary | ICD-10-CM

## 2013-10-30 LAB — HEMOGLOBIN A1C: HEMOGLOBIN A1C: 8.1 % — AB (ref 4.6–6.5)

## 2013-10-30 NOTE — Patient Instructions (Signed)
blood tests are being requested for you today.  We'll contact you with results.  i have sent a prescription to your pharmacy, for a medication called "glucagon."  This is a single-use emergency kit.  It will help you when your blood sugar is so low, you need someone else to help you.  The side-effect is nausea, so you should be turned on your side after getting this shot.  Please come back for a follow-up appointment in 3 months.

## 2013-10-30 NOTE — Progress Notes (Signed)
Subjective:    Patient ID: Kenneth Pace, male    DOB: October 09, 1950, 63 y.o.   MRN: [id number]  HPI pt returns for f/u of type 1 DM (dx'ed 1984, when he presented with DKA; he has had no episodes of DKA since; complicated by retinopathy; he has a medtronic pump and continuous glucose monitor; he has never had pancreatitis; in 2015; he had 3 episodes of severe hypoglycemia).  He has resumed traveling at work.   i downloaded the continuous glucose monitor data and we reviewed together.  He averages approx 125 units total per day.   He takes these settings: basal rate of 2.8 units/hr (except 3.2 units/hr, 3 AM-6 AM).   bolus of 1 unit/ 15 grams carbohydrate.   correction bolus of 1 unit for each 50 by which your glucose exceeds 100.  Past Medical History  Diagnosis Date  . DIABETES MELLITUS, TYPE I, UNCONTROLLED 12/29/2006  . HYPERLIPIDEMIA 02/16/2007  . ANXIETY 02/16/2007  . ERECTILE DYSFUNCTION 02/16/2007  . HYPERTENSION 02/16/2007  . TESTICULAR MASS, LEFT 02/16/2007  . ADENOIDECTOMY, HX OF 02/16/2007  . BENIGN PROSTATIC HYPERTROPHY, WITH OBSTRUCTION 02/03/2010    Past Surgical History  Procedure Laterality Date  . Tonsillectomy      History   Social History  . Marital Status: Married    Spouse Name: N/A    Number of Children: N/A  . 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 Smoking status: Never Smoker   . Smokeless tobacco: Not on file  . Alcohol Use: Not on file  . Drug Use: Not on file  . Sexual Activity: Not on file   Other Topics Concern  . Not on file   Social History Narrative  . No narrative on file    Current Outpatient Prescriptions on File Prior to Visit  Medication Sig Dispense Refill  . aspirin 81 MG tablet Take 81 mg by mouth daily.        . busPIRone (BUSPAR) 30 MG tablet Take 0.5 tablets (15 mg total) by mouth 2 (two) times daily.  180 tablet  3  . co-enzyme Q-10 30 MG  capsule Take 30 mg by mouth daily.        . Continuous Glucose Monitor Sup (SOF-SENSOR) MISC 1 Device by Does not apply route 2 (two) times a week.  30 each  5  . Cranberry 400 MG TABS Take 2 each by mouth daily.        . Cyanocobalamin (VITAMIN B-12 CR PO) Take 1 each by mouth daily.        Marland Kitchen ezetimibe (ZETIA) 10 MG tablet Take 1 tablet (10 mg total) by mouth daily.  90 tablet  3  . glucagon 1 MG injection Inject 1 mg into the vein once as needed.  1 each  12  . glucose blood (ONE TOUCH ULTRA TEST) test strip 5/day dx 250.01, and lancets  450 each  3  . Insulin Infusion Pump Supplies (PARADIGM RESERVOIR 3ML) MISC 1 Device by Does not apply route every 3 (three) days.  30 each  3  . Insulin Infusion Pump Supplies (PARADIGM SILHOUETTE COMBO 43") MISC 1 Device by Does not apply route every 3 (three) days.  30 each  3  . insulin lispro (HUMALOG) 100 UNIT/ML injection Use in pump, 120 units daily  120 mL  3  . Lancets Misc. (ACCU-CHEK MULTICLIX LANCET DEV) KIT  1 Device by Other route 5 (five) times daily as needed. 5/day dx 250.01  450 each  3  . lisinopril-hydrochlorothiazide (PRINZIDE,ZESTORETIC) 10-12.5 MG per tablet Take 1 tablet by mouth daily.  90 tablet  3  . Misc. Devices MISC IV 3000 infusion set cover.  Change every 3 days  30 each  3  . Multiple Vitamins-Minerals (MULTIVITAMIN,TX-MINERALS) tablet Take 1 tablet by mouth daily.        . rosuvastatin (CRESTOR) 40 MG tablet Take 1 tablet (40 mg total) by mouth daily.  90 tablet  3  . terbinafine (LAMISIL) 250 MG tablet Take 1 tablet (250 mg total) by mouth daily.  90 tablet  0  . traMADol (ULTRAM) 50 MG tablet Take 1 tablet (50 mg total) by mouth every 6 (six) hours as needed.  50 tablet  5  . Transparent Dressings (TEGADERM HP) MISC 4x3.  Change every 4 days  25 each  3  . sildenafil (VIAGRA) 100 MG tablet Take 1 tablet (100 mg total) by mouth as needed for erectile dysfunction.  12 tablet  5   No current facility-administered medications on  file prior to visit.    No Known Allergies  Family History  Problem Relation Age of Onset  . Cancer Neg Hx    BP 126/70  Pulse 70  Temp(Src) 97.7 F (36.5 C) (Oral)  Ht _0  (1.803 m)  Wt 237 lb (107.502 kg)  BMI 33.07 kg/m2  SpO2 98%  Review of Systems He denies hypoglycemia and weight change.       Objective:   Physical Exam VITAL SIGNS:  See vs page GENERAL: no distress Pulses: dorsalis pedis intact bilat.   Feet: no deformity.  no edema.  Old healed surgical scar on the left foot (bunion) Skin:  no ulcer on the feet.  normal color and temp. Neuro: sensation is intact to touch on the feet  Lab Results  Component Value Date   HGBA1C 8.1* 10/30/2013       Assessment & Plan:  DM: moderate exacerbation.   Patient is advised the following: Patient Instructions  blood tests are being requested for you today.  We'll contact you with results.  i have sent a prescription to your pharmacy, for a medication called "glucagon."  This is a single-use emergency kit.  It will help you when your blood sugar is so low, you need someone else to help you.  The side-effect is nausea, so you should be turned on your side after getting this shot.  Please come back for a follow-up appointment in 3 months.      (adddemdum: bolus is increased)

## 2013-12-05 LAB — HM DIABETES EYE EXAM

## 2013-12-30 ENCOUNTER — Other Ambulatory Visit: Payer: Self-pay | Admitting: Endocrinology

## 2014-01-29 ENCOUNTER — Other Ambulatory Visit: Payer: Self-pay

## 2014-01-29 ENCOUNTER — Ambulatory Visit (INDEPENDENT_AMBULATORY_CARE_PROVIDER_SITE_OTHER): Payer: BC Managed Care – PPO | Admitting: Endocrinology

## 2014-01-29 ENCOUNTER — Encounter: Payer: Self-pay | Admitting: Endocrinology

## 2014-01-29 VITALS — BP 122/78 | HR 77 | Temp 98.0°F | Ht 71.0 in | Wt 237.0 lb

## 2014-01-29 DIAGNOSIS — E103399 Type 1 diabetes mellitus with moderate nonproliferative diabetic retinopathy without macular edema, unspecified eye: Secondary | ICD-10-CM

## 2014-01-29 DIAGNOSIS — E10339 Type 1 diabetes mellitus with moderate nonproliferative diabetic retinopathy without macular edema: Secondary | ICD-10-CM

## 2014-01-29 LAB — HEMOGLOBIN A1C: Hgb A1c MFr Bld: 8.7 % — ABNORMAL HIGH (ref 4.6–6.5)

## 2014-01-29 MED ORDER — ROSUVASTATIN CALCIUM 40 MG PO TABS
40.0000 mg | ORAL_TABLET | Freq: Every day | ORAL | Status: DC
Start: 2014-01-29 — End: 2015-01-03

## 2014-01-29 MED ORDER — GLUCOSE BLOOD VI STRP: ORAL_STRIP | Status: DC

## 2014-01-29 MED ORDER — EZETIMIBE 10 MG PO TABS: 10.0000 mg | ORAL_TABLET | Freq: Every day | ORAL | Status: DC

## 2014-01-29 MED ORDER — INSULIN LISPRO 100 UNIT/ML ~~LOC~~ SOLN: SUBCUTANEOUS | Status: DC

## 2014-01-29 MED ORDER — PARADIGM PUMP RESERVOIR 3ML MISC
1.0000 | Status: DC
Start: 2014-01-29 — End: 2015-01-03

## 2014-01-29 MED ORDER — SOF-SENSOR MISC: 1.0000 | Status: DC

## 2014-01-29 MED ORDER — TEGADERM HP MISC: Status: DC

## 2014-01-29 MED ORDER — "PARADIGM SILHOUETTE COMBO 43"" MISC": 1.0000 | Status: DC

## 2014-01-29 MED ORDER — ACCU-CHEK MULTICLIX LANCET DEV KIT: 1.0000 | PACK | Freq: Every day | Status: DC | PRN

## 2014-01-29 MED ORDER — BUSPIRONE HCL 30 MG PO TABS: ORAL_TABLET | ORAL | Status: DC

## 2014-01-29 MED ORDER — LISINOPRIL-HYDROCHLOROTHIAZIDE 10-12.5 MG PO TABS: 1.0000 | ORAL_TABLET | Freq: Every day | ORAL | Status: DC

## 2014-01-29 NOTE — Patient Instructions (Addendum)
blood tests are being requested for you today.  We'll contact you with results.  Please continue basal rate of 2.8 units/hr, except 3.2 units/hr, 3 AM-6 AM.   Please increase bolus to 1 unit/ 10 grams carbohydrate.   continue correction bolus (which some people call "sensitivity," or "insulin sensitivity ratio," or just "isr") of 1 unit for each 50 by which your glucose exceeds 100.  Please come back for a regular physical appointment in 3 months.    

## 2014-01-29 NOTE — Progress Notes (Signed)
Subjective:    Patient ID: Kenneth Pace, male    DOB: 1950/07/10, 63 y.o.   MRN: [id number]  HPI Pt returns for f/u of diabetes mellitus: DM type: 1 Dx'ed: 9924 Complications: retinopathy.   Therapy: insulin since dx DKA: only at dx Severe hypoglycemia: most recently in 2014. 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   Interval history:  i downloaded the continuous glucose monitor data and we reviewed together.  It is scanned into the record.  He averages approx 125 units total per day.   He takes these pump settings: basal rate of 2.8 units/hr, except 3.2 units/hr, 3 AM-6 AM.   Mealtime bolus of 1 unit/ 12 grams carbohydrate.    correction bolus of 1 unit for each 50 by which your glucose exceeds 100.   Past Medical History  Diagnosis Date  . DIABETES MELLITUS, TYPE I, UNCONTROLLED 12/29/2006  . HYPERLIPIDEMIA 02/16/2007  . ANXIETY 02/16/2007  . ERECTILE DYSFUNCTION 02/16/2007  . HYPERTENSION 02/16/2007  . TESTICULAR MASS, LEFT 02/16/2007  . ADENOIDECTOMY, HX OF 02/16/2007  . BENIGN PROSTATIC HYPERTROPHY, WITH OBSTRUCTION 02/03/2010    Past Surgical History  Procedure Laterality Date  . Tonsillectomy      History   Social History  . Marital Status: Married    Spouse Name: N/A    Number of Children: N/A  . 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 Smoking status: Never Smoker   . Smokeless tobacco: Not on file  . Alcohol Use: Not on file  . Drug Use: Not on file  . Sexual Activity: Not on file   Other Topics Concern  . Not on file   Social History Narrative    Current Outpatient Prescriptions on File Prior to Visit  Medication Sig Dispense Refill  . aspirin 81 MG tablet Take 81 mg by mouth daily.      Marland Kitchen co-enzyme Q-10 30 MG capsule Take 30 mg by mouth daily.      . Cranberry 400 MG TABS  Take 2 each by mouth daily.      . Cyanocobalamin (VITAMIN B-12 CR PO) Take 1 each by mouth daily.      Marland Kitchen glucagon 1 MG injection Inject 1 mg into the vein once as needed. 1 each 12  . Misc. Devices MISC IV 3000 infusion set cover.  Change every 3 days 30 each 3  . Multiple Vitamins-Minerals (MULTIVITAMIN,TX-MINERALS) tablet Take 1 tablet by mouth daily.      Marland Kitchen terbinafine (LAMISIL) 250 MG tablet Take 1 tablet (250 mg total) by mouth daily. 90 tablet 0  . traMADol (ULTRAM) 50 MG tablet Take 1 tablet (50 mg total) by mouth every 6 (six) hours as needed. 50 tablet 5  . sildenafil (VIAGRA) 100 MG tablet Take 1 tablet (100 mg total) by mouth as needed for erectile dysfunction. 12 tablet 5   No current facility-administered medications on file prior to visit.    No Known Allergies  Family History  Problem Relation Age of Onset  . Cancer Neg Hx     BP 122/78 mmHg  Pulse 77  Temp(Src) 98 F (36.7 C) (Oral)  Ht 5\' 11"  (1.803 m)  Wt 237 lb (107.502 kg)  BMI 33.07 kg/m2  SpO2 97%    Review of Systems He denies hypoglycemia and weight change  Objective:   Physical Exam VITAL SIGNS:  See vs page GENERAL: no distress Pulses: dorsalis pedis intact bilat.   Feet: no deformity.  no edema.  There is bilateral onychomycosis Skin:  no ulcer on the feet.  normal color and temp. Neuro: sensation is intact to touch on the feet   Lab Results  Component Value Date   HGBA1C 8.7* 01/29/2014       Assessment & Plan:  DM: moderate exacerbation.    Patient is advised the following: Patient Instructions  blood tests are being requested for you today.  We'll contact you with results.  Please continue basal rate of 2.8 units/hr, except 3.2 units/hr, 3 AM-6 AM.   Please increase bolus to 1 unit/ 10 grams carbohydrate.  continue correction bolus (which some people call "sensitivity," or "insulin sensitivity ratio," or just "isr") of 1 unit for each 50 by which your glucose exceeds 100.    Please come back for a regular physical appointment in 3 months.

## 2014-01-31 DIAGNOSIS — N401 Enlarged prostate with lower urinary tract symptoms: Secondary | ICD-10-CM | POA: Insufficient documentation

## 2014-02-12 ENCOUNTER — Other Ambulatory Visit: Payer: Self-pay | Admitting: Endocrinology

## 2014-03-05 ENCOUNTER — Other Ambulatory Visit: Payer: Self-pay | Admitting: Endocrinology

## 2014-03-14 DIAGNOSIS — D487 Neoplasm of uncertain behavior of other specified sites: Secondary | ICD-10-CM | POA: Insufficient documentation

## 2014-05-07 ENCOUNTER — Ambulatory Visit: Payer: BC Managed Care – PPO | Admitting: Endocrinology

## 2014-05-28 ENCOUNTER — Ambulatory Visit (INDEPENDENT_AMBULATORY_CARE_PROVIDER_SITE_OTHER): Payer: Self-pay | Admitting: Endocrinology

## 2014-05-28 ENCOUNTER — Encounter: Payer: Self-pay | Admitting: Endocrinology

## 2014-05-28 VITALS — BP 132/86 | HR 74 | Temp 97.8°F | Ht 71.0 in | Wt 231.0 lb

## 2014-05-28 DIAGNOSIS — E103399 Type 1 diabetes mellitus with moderate nonproliferative diabetic retinopathy without macular edema, unspecified eye: Secondary | ICD-10-CM

## 2014-05-28 DIAGNOSIS — E10339 Type 1 diabetes mellitus with moderate nonproliferative diabetic retinopathy without macular edema: Secondary | ICD-10-CM

## 2014-05-28 LAB — HEMOGLOBIN A1C: Hgb A1c MFr Bld: 8.3 % — ABNORMAL HIGH (ref 4.6–6.5)

## 2014-05-28 NOTE — Patient Instructions (Addendum)
blood tests are being requested for you today.  We'll contact you with results.  Please continue basal rate of 2.8 units/hr, except 3.2 units/hr, 3 AM-6 AM.   Please increase bolus to 1 unit/ 10 grams carbohydrate.   continue correction bolus (which some people call "sensitivity," or "insulin sensitivity ratio," or just "isr") of 1 unit for each 50 by which your glucose exceeds 100.  Please come back for a regular physical appointment in 3 months.

## 2014-05-28 NOTE — Progress Notes (Signed)
Subjective:    Patient ID: Kenneth Pace, male    DOB: 1950/07/07, 64 y.o.   MRN: [id number]  HPI Pt returns for f/u of diabetes mellitus: DM type: 1 Dx'ed: 1984 Complications: retinopathy.   Therapy: insulin since dx DKA: only at dx Severe hypoglycemia: most recently in 2014. 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   Interval history:  i downloaded the continuous glucose monitor data and we reviewed together. He averages approx 135 units total per day.   He takes these pump settings: basal rate of 2.8 units/hr, except 3.2 units/hr, 3 AM-6 AM.   Mealtime bolus of 1 unit/ 10 grams carbohydrate.    correction bolus of 1 unit for each 50 by which your glucose exceeds 100.   He denies hypoglycemia.   Past Medical History  Diagnosis Date  . DIABETES MELLITUS, TYPE I, UNCONTROLLED 12/29/2006  . HYPERLIPIDEMIA 02/16/2007  . ANXIETY 02/16/2007  . ERECTILE DYSFUNCTION 02/16/2007  . HYPERTENSION 02/16/2007  . TESTICULAR MASS, LEFT 02/16/2007  . ADENOIDECTOMY, HX OF 02/16/2007  . BENIGN PROSTATIC HYPERTROPHY, WITH OBSTRUCTION 02/03/2010    Past Surgical History  Procedure Laterality Date  . Tonsillectomy      History   Social History  . Marital Status: Married    Spouse Name: N/A  . Number of Children: N/A  . 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 Smoking status: Never Smoker   . Smokeless tobacco: Not on file  . Alcohol Use: Not on file  . Drug Use: Not on file  . Sexual Activity: Not on file   Other Topics Concern  . Not on file   Social History Narrative    Current Outpatient Prescriptions on File Prior to Visit  Medication Sig Dispense Refill  . aspirin 81 MG tablet Take 81 mg by mouth daily.      . busPIRone (BUSPAR) 30 MG tablet TAKE ONE-HALF (1/2) TABLET (15 MG) TWICE A DAY 180 tablet 2    . co-enzyme Q-10 30 MG capsule Take 30 mg by mouth daily.      . Continuous Glucose Monitor Sup (SOF-SENSOR) MISC 1 Device by Does not apply route 2 (two) times a week. 30 each 5  . Cranberry 400 MG TABS Take 2 each by mouth daily.      . Cyanocobalamin (VITAMIN B-12 CR PO) Take 1 each by mouth daily.      Marland Kitchen ezetimibe (ZETIA) 10 MG tablet Take 1 tablet (10 mg total) by mouth daily. 90 tablet 3  . glucagon 1 MG injection Inject 1 mg into the vein once as needed. 1 each 12  . glucose blood (ONE TOUCH ULTRA TEST) test strip 5/day dx 250.01, and lancets 450 each 3  . HUMALOG 100 UNIT/ML injection USE IN PUMP 120 UNITS DAILY 100 mL 1  . Insulin Infusion Pump Supplies (PARADIGM RESERVOIR ) MISC 1 Device by Does not apply route every 3 (three) days. 30 each 3  . Insulin Infusion Pump Supplies (PARADIGM SILHOUETTE COMBO 43") MISC 1 Device by Does not apply route every 3 (three) days. 30 each 3  . Lancets Misc. (ACCU-CHEK MULTICLIX LANCET DEV) KIT 1 Device by Other route 5 (five) times daily as needed. 5/day dx 250.01 450 each 3  . lisinopril-hydrochlorothiazide (PRINZIDE,ZESTORETIC) 10-12.5 MG per tablet TAKE 1 TABLET DAILY 90 tablet  2  . Misc. Devices MISC IV 3000 infusion set cover.  Change every 3 days 30 each 3  . Multiple Vitamins-Minerals (MULTIVITAMIN,TX-MINERALS) tablet Take 1 tablet by mouth daily.      . rosuvastatin (CRESTOR) 40 MG tablet Take 1 tablet (40 mg total) by mouth daily. 90 tablet 3  . traMADol (ULTRAM) 50 MG tablet Take 1 tablet (50 mg total) by mouth every 6 (six) hours as needed. 50 tablet 5  . Transparent Dressings (TEGADERM HP) MISC 4x3.  Change every 4 days 25 each 3  . sildenafil (VIAGRA) 100 MG tablet Take 1 tablet (100 mg total) by mouth as needed for erectile dysfunction. 12 tablet 5   No current facility-administered medications on file prior to visit.    No Known Allergies  Family History  Problem Relation Age of Onset  . Cancer Neg Hx     BP 132/86 mmHg   Pulse 74  Temp(Src) 97.8 F (36.6 C) (Oral)  Ht _0  (1.803 m)  Wt 231 lb (104.781 kg)  BMI 32.23 kg/m2  SpO2 95%  Review of Systems He has gained a few lbs.  He denies edema    Objective:   Physical Exam VITAL SIGNS:  See vs page GENERAL: no distress Pulses: dorsalis pedis intact bilat.   MSK: no deformity of the feet CV: no leg edema Skin:  no ulcer on the feet.  normal color and temp on the feet. Neuro: sensation is intact to touch on the feet Ext: There is bilateral onychomycosis of the toenails.    Lab Results  Component Value Date   HGBA1C 8.3* 05/28/2014      Assessment & Plan:  DM: he needs increased rx   Patient is advised the following: Patient Instructions  blood tests are being requested for you today.  We'll contact you with results.  Please continue basal rate of 2.8 units/hr, except 3.2 units/hr, 3 AM-6 AM.   Please increase bolus to 1 unit/ 10 grams carbohydrate.   continue correction bolus (which some people call "sensitivity," or "insulin sensitivity ratio," or just "isr") of 1 unit for each 50 by which your glucose exceeds 100.  Please come back for a regular physical appointment in 3 months.     addendum: add 7 units to your calculated supper bolus.

## 2014-07-26 ENCOUNTER — Other Ambulatory Visit: Payer: Self-pay | Admitting: Endocrinology

## 2014-08-22 ENCOUNTER — Encounter: Payer: Self-pay | Admitting: Endocrinology

## 2014-08-22 ENCOUNTER — Ambulatory Visit (INDEPENDENT_AMBULATORY_CARE_PROVIDER_SITE_OTHER): Payer: BLUE CROSS/BLUE SHIELD | Admitting: Endocrinology

## 2014-08-22 VITALS — BP 126/87 | HR 74 | Temp 97.8°F | Ht 71.0 in | Wt 237.0 lb

## 2014-08-22 DIAGNOSIS — I10 Essential (primary) hypertension: Secondary | ICD-10-CM | POA: Diagnosis not present

## 2014-08-22 DIAGNOSIS — Z125 Encounter for screening for malignant neoplasm of prostate: Secondary | ICD-10-CM | POA: Diagnosis not present

## 2014-08-22 DIAGNOSIS — E1065 Type 1 diabetes mellitus with hyperglycemia: Secondary | ICD-10-CM

## 2014-08-22 DIAGNOSIS — E785 Hyperlipidemia, unspecified: Secondary | ICD-10-CM | POA: Diagnosis not present

## 2014-08-22 DIAGNOSIS — Z0000 Encounter for general adult medical examination without abnormal findings: Secondary | ICD-10-CM

## 2014-08-22 DIAGNOSIS — E10339 Type 1 diabetes mellitus with moderate nonproliferative diabetic retinopathy without macular edema: Secondary | ICD-10-CM | POA: Diagnosis not present

## 2014-08-22 DIAGNOSIS — Z23 Encounter for immunization: Secondary | ICD-10-CM | POA: Diagnosis not present

## 2014-08-22 DIAGNOSIS — E103399 Type 1 diabetes mellitus with moderate nonproliferative diabetic retinopathy without macular edema, unspecified eye: Secondary | ICD-10-CM

## 2014-08-22 DIAGNOSIS — IMO0002 Reserved for concepts with insufficient information to code with codable children: Secondary | ICD-10-CM

## 2014-08-22 DIAGNOSIS — R748 Abnormal levels of other serum enzymes: Secondary | ICD-10-CM

## 2014-08-22 LAB — URINALYSIS, ROUTINE W REFLEX MICROSCOPIC
Bilirubin Urine: NEGATIVE
HGB URINE DIPSTICK: NEGATIVE
Ketones, ur: NEGATIVE
Leukocytes, UA: NEGATIVE
Nitrite: NEGATIVE
RBC / HPF: NONE SEEN (ref 0–?)
Specific Gravity, Urine: 1.015 (ref 1.000–1.030)
Total Protein, Urine: NEGATIVE
URINE GLUCOSE: NEGATIVE
Urobilinogen, UA: 0.2 (ref 0.0–1.0)
WBC UA: NONE SEEN (ref 0–?)
pH: 6 (ref 5.0–8.0)

## 2014-08-22 LAB — BASIC METABOLIC PANEL
BUN: 18 mg/dL (ref 6–23)
CALCIUM: 9.2 mg/dL (ref 8.4–10.5)
CO2: 29 mEq/L (ref 19–32)
Chloride: 103 mEq/L (ref 96–112)
Creatinine, Ser: 1.01 mg/dL (ref 0.40–1.50)
GFR: 79.54 mL/min (ref >60.00)
GLUCOSE: 124 mg/dL — AB (ref 70–99)
Potassium: 3.8 mEq/L (ref 3.5–5.1)
SODIUM: 138 meq/L (ref 135–145)

## 2014-08-22 LAB — LIPID PANEL
CHOLESTEROL: 94 mg/dL (ref 0–200)
HDL: 33.6 mg/dL — ABNORMAL LOW (ref >39.00)
LDL CALC: 38 mg/dL (ref 0–99)
NonHDL: 60.4
Total CHOL/HDL Ratio: 3
Triglycerides: 111 mg/dL (ref 0.0–149.0)
VLDL: 22.2 mg/dL (ref 0.0–40.0)

## 2014-08-22 LAB — HEPATIC FUNCTION PANEL
ALT: 25 U/L (ref 0–53)
AST: 21 U/L (ref 0–37)
Albumin: 3.9 g/dL (ref 3.5–5.2)
Alkaline Phosphatase: 148 U/L — ABNORMAL HIGH (ref 39–117)
BILIRUBIN TOTAL: 0.6 mg/dL (ref 0.2–1.2)
Bilirubin, Direct: 0.1 mg/dL (ref 0.0–0.3)
Total Protein: 6.9 g/dL (ref 6.0–8.3)

## 2014-08-22 LAB — CBC WITH DIFFERENTIAL/PLATELET
Basophils Absolute: 0 10*3/uL (ref 0.0–0.1)
Basophils Relative: 0.3 % (ref 0.0–3.0)
EOS PCT: 3.5 % (ref 0.0–5.0)
Eosinophils Absolute: 0.2 10*3/uL (ref 0.0–0.7)
HCT: 48.3 % (ref 39.0–52.0)
Hemoglobin: 16.5 g/dL (ref 13.0–17.0)
Lymphocytes Relative: 26.4 % (ref 12.0–46.0)
Lymphs Abs: 1.9 10*3/uL (ref 0.7–4.0)
MCHC: 34.2 g/dL (ref 30.0–36.0)
MCV: 88.4 fl (ref 78.0–100.0)
MONOS PCT: 9.6 % (ref 3.0–12.0)
Monocytes Absolute: 0.7 10*3/uL (ref 0.1–1.0)
NEUTROS PCT: 60.2 % (ref 43.0–77.0)
Neutro Abs: 4.3 10*3/uL (ref 1.4–7.7)
PLATELETS: 172 10*3/uL (ref 150.0–400.0)
RBC: 5.47 Mil/uL (ref 4.22–5.81)
RDW: 13.2 % (ref 11.5–15.5)
WBC: 7.1 10*3/uL (ref 4.0–10.5)

## 2014-08-22 LAB — MICROALBUMIN / CREATININE URINE RATIO
Creatinine,U: 91.2 mg/dL
Microalb Creat Ratio: 0.8 mg/g (ref 0.0–30.0)
Microalb, Ur: <0.7 mg/dL (ref 0.0–1.9)

## 2014-08-22 LAB — PSA: PSA: 1.11 ng/mL (ref 0.10–4.00)

## 2014-08-22 LAB — HEMOGLOBIN A1C: Hgb A1c MFr Bld: 8.4 % — ABNORMAL HIGH (ref 4.6–6.5)

## 2014-08-22 LAB — TSH: TSH: 1.75 u[IU]/mL (ref 0.35–4.50)

## 2014-08-22 NOTE — Progress Notes (Signed)
we discussed code status.  pt requests full code, but would not want to be started or maintained on artificial life-support measures if there was not a reasonable chance of recovery 

## 2014-08-22 NOTE — Patient Instructions (Addendum)
blood tests are being requested for you today.  We'll contact you with results.  Please make every effort to eat something at lunch, even if it is a light snack.   Please continue basal rate of 2.8 units/hr, except 3.2 units/hr, 3 AM-6 AM.   Please increase bolus to 1 unit/ 10 grams carbohydrate, but add 7 units to you calculated supper bolus.   continue correction bolus (which some people call "sensitivity," or "insulin sensitivity ratio," or just "isr") of 1 unit for each 50 by which your glucose exceeds 100.  Please come back for a follow-up appointment in 3 months please consider these measures for your health:  minimize alcohol.  do not use tobacco products.  have a colonoscopy at least every 10 years from age 36.  keep firearms safely stored.  always use seat belts.  have working smoke alarms in your home.  see an eye doctor and dentist regularly.  never drive under the influence of alcohol or drugs (including prescription drugs).  those with fair skin should take precautions against the sun.

## 2014-08-22 NOTE — Progress Notes (Signed)
Subjective:    Patient ID: Kenneth Pace, male    DOB: [birth year], 64 y.o.   MRN: [id number]  HPI Pt is here for regular wellness examination, and is feeling pretty well in general, and says chronic med probs are stable, except as noted below Past Medical History  Diagnosis Date  . DIABETES MELLITUS, TYPE I, UNCONTROLLED 12/29/2006  . HYPERLIPIDEMIA 02/16/2007  . ANXIETY 02/16/2007  . ERECTILE DYSFUNCTION 02/16/2007  . HYPERTENSION 02/16/2007  . TESTICULAR MASS, LEFT 02/16/2007  . ADENOIDECTOMY, HX OF 02/16/2007  . BENIGN PROSTATIC HYPERTROPHY, WITH OBSTRUCTION 02/03/2010    Past Surgical History  Procedure Laterality Date  . Tonsillectomy      History   Social History  . Marital Status: Married    Spouse Name: N/A  . Number of Children: N/A  . 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 Smoking status: Never Smoker   . Smokeless tobacco: Not on file  . Alcohol Use: Not on file  . Drug Use: Not on file  . Sexual Activity: Not on file   Other Topics Concern  . Not on file   Social History Narrative    Current Outpatient Prescriptions on File Prior to Visit  Medication Sig Dispense Refill  . aspirin 81 MG tablet Take 81 mg by mouth daily.      . busPIRone (BUSPAR) 30 MG tablet TAKE ONE-HALF (1/2) TABLET (15 MG) TWICE A DAY 180 tablet 2  . co-enzyme Q-10 30 MG capsule Take 30 mg by mouth daily.      . Continuous Glucose Monitor Sup (SOF-SENSOR) MISC 1 Device by Does not apply route 2 (two) times a week. 30 each 5  . Cranberry 400 MG TABS Take 2 each by mouth daily.      . Cyanocobalamin (VITAMIN B-12 CR PO) Take 1 each by mouth daily.      Marland Kitchen ezetimibe (ZETIA) 10 MG tablet Take 1 tablet (10 mg total) by mouth daily. 90 tablet 3  . finasteride (PROSCAR) 5 MG tablet Take by mouth.    Marland Kitchen glucagon 1 MG injection Inject 1 mg into the vein once as needed. 1 each 12  . glucose blood (ONE TOUCH ULTRA  TEST) test strip 5/day dx 250.01, and lancets 450 each 3  . HUMALOG 100 UNIT/ML injection INJECT 120 UNITS DAILY IN PUMP 100 mL 1  . Insulin Infusion Pump Supplies (PARADIGM RESERVOIR 3ML) MISC 1 Device by Does not apply route every 3 (three) days. 30 each 3  . Insulin Infusion Pump Supplies (PARADIGM SILHOUETTE COMBO 43") MISC 1 Device by Does not apply route every 3 (three) days. 30 each 3  . Lancets Misc. (ACCU-CHEK MULTICLIX LANCET DEV) KIT 1 Device by Other route 5 (five) times daily as needed. 5/day dx 250.01 450 each 3  . lisinopril-hydrochlorothiazide (PRINZIDE,ZESTORETIC) 10-12.5 MG per tablet TAKE 1 TABLET DAILY 90 tablet 2  . Misc. Devices MISC IV 3000 infusion set cover.  Change every 3 days 30 each 3  . Multiple Vitamins-Minerals (MULTIVITAMIN,TX-MINERALS) tablet Take 1 tablet by mouth daily.      . rosuvastatin (CRESTOR) 40 MG tablet Take 1 tablet (40 mg total) by mouth daily. 90 tablet 3  . traMADol (ULTRAM) 50 MG tablet Take 1 tablet (50 mg total) by mouth every 6 (six) hours as needed. 50 tablet 5  . Transparent Dressings (TEGADERM HP) MISC 4x3.  Change every  4 days 25 each 3  . sildenafil (VIAGRA) 100 MG tablet Take 1 tablet (100 mg total) by mouth as needed for erectile dysfunction. 12 tablet 5   No current facility-administered medications on file prior to visit.    No Known Allergies  Family History  Problem Relation Age of Onset  . Cancer Neg Hx     BP 126/87 mmHg  Pulse 74  Temp(Src) 97.8 F (36.6 C) (Oral)  Ht 5' 11"  (1.803 m)  Wt 237 lb (107.502 kg)  BMI 33.07 kg/m2  SpO2 98%     Review of Systems  Constitutional: Negative for fever.  HENT: Negative for hearing loss.   Eyes: Negative for visual disturbance.  Respiratory: Negative for shortness of breath.   Cardiovascular: Negative for chest pain.  Gastrointestinal: Negative for anal bleeding.  Endocrine: Negative for cold intolerance.  Genitourinary: Negative for hematuria and difficulty urinating.    Musculoskeletal: Negative for gait problem.  Skin: Negative for rash.  Allergic/Immunologic: Negative for environmental allergies.  Neurological: Negative for numbness.  Hematological: Does not bruise/bleed easily.  Psychiatric/Behavioral: Negative for dysphoric mood.       Objective:   Physical Exam VS: see vs page GEN: no distress HEAD: head: no deformity eyes: no periorbital swelling, no proptosis external nose and ears are normal mouth: no lesion seen NECK: supple, thyroid is not enlarged CHEST WALL: no deformity LUNGS: clear to auscultation BREASTS:  No gynecomastia CV: reg rate and rhythm, no murmur ABD: abdomen is soft, nontender.  no hepatosplenomegaly.  not distended.  no hernia GENITALIA/RECTAL/PROSTATE: sees urology MUSCULOSKELETAL: muscle bulk and strength are grossly normal.  no obvious joint swelling.  gait is normal and steady EXTEMITIES: no deformity.  no ulcer on the feet.  feet are of normal color and temp.  Trace bilat leg edema.  There is bilateral onychomycosis of the toenails.  PULSES: dorsalis pedis intact bilat.  no carotid bruit NEURO:  cn 2-12 grossly intact.   readily moves all 4's.  sensation is intact to touch on the feet SKIN:  Normal texture and temperature.  No rash or suspicious lesion is visible.  SKIN:  Insulin infusion and continuous glucose monitor sites at the anterior abdomen are normal NODES:  None palpable at the neck PSYCH: alert, well-oriented.  Does not appear anxious nor depressed.       Assessment & Plan:  Wellness visit today, with problems stable, except as noted.    SEPARATE EVALUATION FOLLOWS--EACH PROBLEM HERE IS NEW, NOT RESPONDING TO TREATMENT, OR POSES SIGNIFICANT RISK TO THE PATIENT'S HEALTH: HISTORY OF THE PRESENT ILLNESS: Pt returns for f/u of diabetes mellitus: DM type: 1 Dx'ed: 2458 Complications: retinopathy.   Therapy: insulin since dx DKA: only at dx Severe hypoglycemia: most recently in  2014. 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   Interval history:  i downloaded the continuous glucose monitor data and we reviewed together.  He takes these pump settings: basal rate of 2.8 units/hr, except 3.2 units/hr, 3 AM-6 AM.   Mealtime bolus of 1 unit/ 10 grams carbohydrate, but he adds 7 units to you calculated supper bolus. correction bolus of 1 unit for each 50 by which your glucose exceeds 100. Pt says the afternoon mild hypoglycemia is when he is busy at work, and skips lunch.  PAST MEDICAL HISTORY reviewed and up to date today REVIEW OF SYSTEMS: Denies weight change and LOC PHYSICAL EXAMINATION: VITAL SIGNS:  See  vs page GENERAL: no distress Pulses: dorsalis pedis intact bilat.   MSK: no deformity of the feet CV: no leg edema Skin:  no ulcer on the feet.  normal color and temp on the feet. Neuro: sensation is intact to touch on the feet Ext: There is bilateral onychomycosis of the toenails.  LAB/XRAY RESULTS: Lab Results  Component Value Date   ALT 25 08/22/2014   AST 21 08/22/2014   ALKPHOS 148* 08/22/2014   BILITOT 0.6 08/22/2014   Lab Results  Component Value Date   HGBA1C 8.4* 08/22/2014  IMPRESSION: DM: glycemic control is slightly worse.  The next step is to reduce hypoglycemia. Elevated alk phos, worse, uncertain etiology PLAN:  Same pump settings for now. Please make every effort to eat something at lunch, even if it is a light snack. i ordered ap isoenzymes

## 2014-08-23 ENCOUNTER — Other Ambulatory Visit: Payer: Self-pay

## 2014-08-24 ENCOUNTER — Other Ambulatory Visit: Payer: BLUE CROSS/BLUE SHIELD

## 2014-08-24 DIAGNOSIS — R748 Abnormal levels of other serum enzymes: Secondary | ICD-10-CM

## 2014-09-04 LAB — ALKALINE PHOSPHATASE, ISOENZYMES: ALK PHOS: 158 IU/L — AB (ref 39–117)

## 2014-09-20 ENCOUNTER — Other Ambulatory Visit: Payer: Self-pay

## 2014-09-20 ENCOUNTER — Other Ambulatory Visit: Payer: BLUE CROSS/BLUE SHIELD

## 2014-09-20 DIAGNOSIS — R748 Abnormal levels of other serum enzymes: Secondary | ICD-10-CM

## 2014-09-27 LAB — ALKALINE PHOSPHATASE, ISOENZYMES
ALK PHOS: 208 IU/L — AB (ref 39–117)
BONE FRACTION: 50 % (ref 12–68)
INTESTINAL FRAC.: 30 % — ABNORMAL HIGH (ref 0–18)
LIVER FRACTION: 20 % (ref 13–88)

## 2014-11-19 ENCOUNTER — Ambulatory Visit: Payer: BLUE CROSS/BLUE SHIELD | Admitting: Endocrinology

## 2015-01-03 ENCOUNTER — Encounter: Payer: Self-pay | Admitting: Endocrinology

## 2015-01-03 ENCOUNTER — Ambulatory Visit (INDEPENDENT_AMBULATORY_CARE_PROVIDER_SITE_OTHER): Payer: BLUE CROSS/BLUE SHIELD | Admitting: Endocrinology

## 2015-01-03 VITALS — BP 132/64 | HR 82 | Temp 98.0°F | Ht 71.0 in | Wt 232.0 lb

## 2015-01-03 DIAGNOSIS — E103399 Type 1 diabetes mellitus with moderate nonproliferative diabetic retinopathy without macular edema, unspecified eye: Secondary | ICD-10-CM

## 2015-01-03 DIAGNOSIS — Z23 Encounter for immunization: Secondary | ICD-10-CM | POA: Diagnosis not present

## 2015-01-03 LAB — POCT GLYCOSYLATED HEMOGLOBIN (HGB A1C): Hemoglobin A1C: 8.7

## 2015-01-03 MED ORDER — LISINOPRIL-HYDROCHLOROTHIAZIDE 10-12.5 MG PO TABS: 1.0000 | ORAL_TABLET | Freq: Every day | ORAL | Status: DC

## 2015-01-03 MED ORDER — TEGADERM HP MISC: Status: DC

## 2015-01-03 MED ORDER — ROSUVASTATIN CALCIUM 40 MG PO TABS: 40.0000 mg | ORAL_TABLET | Freq: Every day | ORAL | Status: DC

## 2015-01-03 MED ORDER — PARADIGM PUMP RESERVOIR 3ML MISC: 1.0000 | Status: DC

## 2015-01-03 MED ORDER — EZETIMIBE 10 MG PO TABS: 10.0000 mg | ORAL_TABLET | Freq: Every day | ORAL | Status: DC

## 2015-01-03 MED ORDER — GLUCOSE BLOOD VI STRP: ORAL_STRIP | Status: DC

## 2015-01-03 MED ORDER — INSULIN LISPRO 100 UNIT/ML ~~LOC~~ SOLN: SUBCUTANEOUS | Status: DC

## 2015-01-03 MED ORDER — ACCU-CHEK MULTICLIX LANCET DEV KIT: 1.0000 | PACK | Freq: Every day | Status: DC | PRN

## 2015-01-03 MED ORDER — BUSPIRONE HCL 30 MG PO TABS: ORAL_TABLET | ORAL | Status: DC

## 2015-01-03 MED ORDER — "PARADIGM SILHOUETTE COMBO 43"" MISC": 1.0000 | Status: DC

## 2015-01-03 MED ORDER — SOF-SENSOR MISC: 1.0000 | Status: DC

## 2015-01-03 NOTE — Patient Instructions (Addendum)
Please make every effort to eat something at lunch, even if it is a light snack.   Please decrease basal rate to 2.6 units/hr, except 3.2 units/hr, 3 AM-6 AM.   Please increase bolus to 1 unit/ 9 grams carbohydrate, but add 7 units to you calculated supper bolus.   continue correction bolus (which some people call "sensitivity," or "insulin sensitivity ratio," or just "isr") of 1 unit for each 50 by which your glucose exceeds 100.  Please come back for a follow-up appointment in 3 months.

## 2015-01-03 NOTE — Progress Notes (Signed)
Subjective:    Patient ID: Kenneth Pace, male    DOB: [birth year], 64 y.o.   MRN: [id number]  HPI Pt returns for f/u of diabetes mellitus: DM type: 1 Dx'ed: [id number] Complications: retinopathy.   Therapy: insulin since dx DKA: only at dx Severe hypoglycemia: most recently in 2014. 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   Interval history:  I downloaded the continuous glucose monitor data and we reviewed together.  It varies from 50-300.  It is lowest fasting and in the afternoon. He takes these pump settings:  basal rate of 2.8 units/hr, except 3.2 units/hr, 3 AM-6 AM.   Mealtime bolus of 1 unit/ 10 grams carbohydrate, but he adds 7 units to his calculated supper bolus. correction bolus of 1 unit for each 50 by which glucose exceeds 100.  Pt says the afternoon mild hypoglycemia is still when he is busy at work, and skips lunch.  He says he takes an average total of approx 125 units qd.   Past Medical History  Diagnosis Date  . DIABETES MELLITUS, TYPE I, UNCONTROLLED 12/29/2006  . HYPERLIPIDEMIA 02/16/2007  . ANXIETY 02/16/2007  . ERECTILE DYSFUNCTION 02/16/2007  . HYPERTENSION 02/16/2007  . TESTICULAR MASS, LEFT 02/16/2007  . ADENOIDECTOMY, HX OF 02/16/2007  . BENIGN PROSTATIC HYPERTROPHY, WITH OBSTRUCTION 02/03/2010    Past Surgical History  Procedure Laterality Date  . Tonsillectomy      Social History   Social History  . Marital Status: Married    Spouse Name: N/A  . Number of Children: N/A  . 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 Smoking status: Never Smoker   . Smokeless tobacco: Not on file  . Alcohol Use: Not on file  . Drug Use: Not on file  . Sexual Activity: Not on file   Other Topics Concern  . Not on file   Social History Narrative    Current Outpatient Prescriptions on File  Prior to Visit  Medication Sig Dispense Refill  . aspirin 81 MG tablet Take 81 mg by mouth daily.      Marland Kitchen co-enzyme Q-10 30 MG capsule Take 30 mg by mouth daily.      . Cranberry 400 MG TABS Take 2 each by mouth daily.      . Cyanocobalamin (VITAMIN B-12 CR PO) Take 1 each by mouth daily.      . finasteride (PROSCAR) 5 MG tablet Take by mouth.    Marland Kitchen glucagon 1 MG injection Inject 1 mg into the vein once as needed. 1 each 12  . Misc. Devices MISC IV 3000 infusion set cover.  Change every 3 days 30 each 3  . Multiple Vitamins-Minerals (MULTIVITAMIN,TX-MINERALS) tablet Take 1 tablet by mouth daily.      . traMADol (ULTRAM) 50 MG tablet Take 1 tablet (50 mg total) by mouth every 6 (six) hours as needed. 50 tablet 5   No current facility-administered medications on file prior to visit.    No Known Allergies  Family History  Problem Relation Age of Onset  . Cancer Neg Hx     BP 132/64 mmHg  Pulse 82  Temp(Src) 98 F (36.7 C) (Oral)  Ht 5\' 11"  (1.803 m)  Wt 232 lb (105.235 kg)  BMI 32.37 kg/m2  SpO2 97%  Review of Systems Denies LOC.  Objective:   Physical Exam VITAL SIGNS:  See vs page GENERAL: no distress Pulses: dorsalis pedis intact bilat.   MSK: no deformity of the feet CV: no leg edema Skin:  no ulcer on the feet.  normal color and temp on the feet. Neuro: sensation is intact to touch on the feet Ext; There is bilateral onychomycosis of the toenails.      A1c=8.7    Assessment & Plan:  DM: he needs increased rx  Patient is advised the following: Patient Instructions  Please make every effort to eat something at lunch, even if it is a light snack.   Please decrease basal rate to 2.6 units/hr, except 3.2 units/hr, 3 AM-6 AM.   Please increase bolus to 1 unit/ 9 grams carbohydrate, but add 7 units to you calculated supper bolus.   continue correction bolus (which some people call "sensitivity," or "insulin sensitivity ratio," or just "isr") of 1 unit for each 50  by which your glucose exceeds 100.  Please come back for a follow-up appointment in 3 months.

## 2015-01-08 ENCOUNTER — Telehealth: Payer: Self-pay | Admitting: Nutrition

## 2015-01-08 NOTE — Telephone Encounter (Signed)
Message left on cell phone answering machine to call me.

## 2015-01-08 NOTE — Telephone Encounter (Signed)
Patient is returning your call.  

## 2015-01-08 NOTE — Telephone Encounter (Signed)
Patient need to talk to you concerning the vgo pump equipment

## 2015-01-15 NOTE — Telephone Encounter (Signed)
I talked with his wife, and she is having difficulty ordering the old sensors.  They want to upgrade her to the new sensors, saying they are not making them anymore.  I gave her Roxanne Ripple's number (the Medtronic Rep.'s number, and told her that she can get her what she needs, and answer the questions that that the new sensor will work with the old transmitter, and send the readings to the pump.

## 2015-02-12 ENCOUNTER — Ambulatory Visit (INDEPENDENT_AMBULATORY_CARE_PROVIDER_SITE_OTHER): Payer: BLUE CROSS/BLUE SHIELD | Admitting: Internal Medicine

## 2015-02-12 ENCOUNTER — Encounter: Payer: Self-pay | Admitting: Internal Medicine

## 2015-02-12 VITALS — BP 100/68 | HR 85 | Temp 98.5°F | Resp 16 | Ht 71.0 in | Wt 232.0 lb

## 2015-02-12 DIAGNOSIS — H6980 Other specified disorders of Eustachian tube, unspecified ear: Secondary | ICD-10-CM | POA: Diagnosis not present

## 2015-02-12 DIAGNOSIS — J0100 Acute maxillary sinusitis, unspecified: Secondary | ICD-10-CM | POA: Insufficient documentation

## 2015-02-12 DIAGNOSIS — H6990 Unspecified Eustachian tube disorder, unspecified ear: Secondary | ICD-10-CM | POA: Insufficient documentation

## 2015-02-12 MED ORDER — HYDROCOD POLST-CPM POLST ER 10-8 MG/5ML PO SUER: 5.0000 mL | Freq: Two times a day (BID) | ORAL | Status: DC | PRN

## 2015-02-12 MED ORDER — METHYLPREDNISOLONE 4 MG PO TBPK: ORAL_TABLET | ORAL | Status: DC

## 2015-02-12 MED ORDER — AMOXICILLIN-POT CLAVULANATE 875-125 MG PO TABS: 1.0000 | ORAL_TABLET | Freq: Two times a day (BID) | ORAL | Status: DC

## 2015-02-12 NOTE — Patient Instructions (Signed)

## 2015-02-12 NOTE — Progress Notes (Signed)
Pre visit review using our clinic review tool, if applicable. No additional management support is needed unless otherwise documented below in the visit note. 

## 2015-02-12 NOTE — Progress Notes (Signed)
Subjective:  Patient ID: Kenneth Pace, male    DOB: 1950-12-02  Age: 64 y.o. MRN: [id number]  CC: Sinusitis and Cough   HPI Kenneth Pace presents for a 5 day history of bilateral ear pain with popping and muffled hearing, scratchy throat, runny nose, facial pain, chills, cough productive of green phlegm. He has been taking Sudafed and Tylenol with some symptom relief.  Outpatient Prescriptions Prior to Visit  Medication Sig Dispense Refill  . aspirin 81 MG tablet Take 81 mg by mouth daily.      . busPIRone (BUSPAR) 30 MG tablet TAKE ONE-HALF (1/2) TABLET (15 MG) TWICE A DAY 180 tablet 2  . co-enzyme Q-10 30 MG capsule Take 30 mg by mouth daily.      . Continuous Glucose Monitor Sup (SOF-SENSOR) MISC 1 Device by Does not apply route 2 (two) times a week. 30 each 5  . Cranberry 400 MG TABS Take 2 each by mouth daily.      . Cyanocobalamin (VITAMIN B-12 CR PO) Take 1 each by mouth daily.      Marland Kitchen ezetimibe (ZETIA) 10 MG tablet Take 1 tablet (10 mg total) by mouth daily. 90 tablet 3  . finasteride (PROSCAR) 5 MG tablet Take by mouth.    Marland Kitchen glucagon 1 MG injection Inject 1 mg into the vein once as needed. 1 each 12  . glucose blood (ONE TOUCH ULTRA TEST) test strip 5/day dx 250.01, and lancets 450 each 3  . Insulin Infusion Pump Supplies (PARADIGM RESERVOIR 3ML) MISC 1 Device by Does not apply route every 3 (three) days. 30 each 3  . Insulin Infusion Pump Supplies (PARADIGM SILHOUETTE COMBO 43") MISC 1 Device by Does not apply route every 3 (three) days. 30 each 3  . insulin lispro (HUMALOG) 100 UNIT/ML injection INJECT 120 UNITS DAILY IN PUMP 100 mL 1  . Lancets Misc. (ACCU-CHEK MULTICLIX LANCET DEV) KIT 1 Device by Other route 5 (five) times daily as needed. 5/day dx 250.01 450 each 3  . lisinopril-hydrochlorothiazide (PRINZIDE,ZESTORETIC) 10-12.5 MG tablet Take 1 tablet by mouth daily. 90 tablet 2  . Misc. Devices MISC IV 3000 infusion set cover.  Change every 3 days 30 each 3    . Multiple Vitamins-Minerals (MULTIVITAMIN,TX-MINERALS) tablet Take 1 tablet by mouth daily.      . rosuvastatin (CRESTOR) 40 MG tablet Take 1 tablet (40 mg total) by mouth daily. 90 tablet 3  . traMADol (ULTRAM) 50 MG tablet Take 1 tablet (50 mg total) by mouth every 6 (six) hours as needed. 50 tablet 5  . Transparent Dressings (TEGADERM HP) MISC 4x3.  Change every 4 days 25 each 3   No facility-administered medications prior to visit.    ROS Review of Systems  Constitutional: Positive for chills. Negative for fever, diaphoresis, activity change, appetite change, fatigue and unexpected weight change.  HENT: Positive for congestion, postnasal drip, rhinorrhea, sinus pressure, sneezing and sore throat. Negative for facial swelling, tinnitus, trouble swallowing and voice change.   Eyes: Negative.  Negative for photophobia and visual disturbance.  Respiratory: Positive for cough. Negative for apnea, choking, chest tightness, shortness of breath, wheezing and stridor.   Cardiovascular: Negative.  Negative for chest pain, palpitations and leg swelling.  Gastrointestinal: Negative.  Negative for nausea, vomiting, abdominal pain, diarrhea, constipation and blood in stool.  Endocrine: Negative.   Genitourinary: Negative.   Musculoskeletal: Negative.  Negative for myalgias, back pain, joint swelling, arthralgias and neck stiffness.  Skin: Negative.  Negative  for color change, pallor and rash.  Allergic/Immunologic: Negative.   Neurological: Negative.  Negative for dizziness, weakness and numbness.  Hematological: Negative.  Negative for adenopathy. Does not bruise/bleed easily.  Psychiatric/Behavioral: Negative.     Objective:  BP 100/68 mmHg  Pulse 85  Temp(Src) 98.5 F (36.9 C) (Oral)  Resp 16  Ht _0  (1.803 m)  Wt 232 lb (105.235 kg)  BMI 32.37 kg/m2  SpO2 95%  BP Readings from Last 3 Encounters:  02/12/15 100/68  01/03/15 132/64  08/22/14 126/87    Wt Readings from Last 3  Encounters:  02/12/15 232 lb (105.235 kg)  01/03/15 232 lb (105.235 kg)  08/22/14 237 lb (107.502 kg)    Physical Exam  Constitutional: He is oriented to person, place, and time. He appears well-developed and well-nourished.  Non-toxic appearance. He does not have a sickly appearance. He does not appear ill. No distress.  HENT:  Head: Normocephalic and atraumatic.  Right Ear: Hearing, tympanic membrane, external ear and ear canal normal.  Left Ear: Hearing, tympanic membrane, external ear and ear canal normal.  Nose: Mucosal edema and rhinorrhea present. No sinus tenderness or nasal deformity. No epistaxis. Right sinus exhibits maxillary sinus tenderness. Right sinus exhibits no frontal sinus tenderness. Left sinus exhibits maxillary sinus tenderness. Left sinus exhibits no frontal sinus tenderness.  Mouth/Throat: Oropharynx is clear and moist and mucous membranes are normal. Mucous membranes are not pale, not dry and not cyanotic. No oral lesions. No trismus in the jaw. No uvula swelling. No oropharyngeal exudate, posterior oropharyngeal edema, posterior oropharyngeal erythema or tonsillar abscesses.  Eyes: Conjunctivae are normal. Right eye exhibits no discharge. Left eye exhibits no discharge. No scleral icterus.  Neck: Normal range of motion. Neck supple. No JVD present. No tracheal deviation present. No thyromegaly present.  Cardiovascular: Normal rate, regular rhythm, normal heart sounds and intact distal pulses.  Exam reveals no gallop and no friction rub.   No murmur heard. Pulmonary/Chest: Effort normal and breath sounds normal. No stridor. No respiratory distress. He has no wheezes. He has no rales. He exhibits no tenderness.  Abdominal: Soft. Bowel sounds are normal. He exhibits no distension and no mass. There is no tenderness. There is no rebound and no guarding.  Musculoskeletal: Normal range of motion. He exhibits no edema or tenderness.  Lymphadenopathy:    He has no cervical  adenopathy.  Neurological: He is oriented to person, place, and time.  Skin: Skin is warm and dry. No rash noted. He is not diaphoretic. No erythema. No pallor.  Vitals reviewed.   Lab Results  Component Value Date   WBC 7.1 08/22/2014   HGB 16.5 08/22/2014   HCT 48.3 08/22/2014   PLT 172.0 08/22/2014   GLUCOSE 124* 08/22/2014   CHOL 94 08/22/2014   TRIG 111.0 08/22/2014   HDL 33.60* 08/22/2014   LDLCALC 38 08/22/2014   ALT 25 08/22/2014   AST 21 08/22/2014   NA 138 08/22/2014   K 3.8 08/22/2014   CL 103 08/22/2014   CREATININE 1.01 08/22/2014   BUN 18 08/22/2014   CO2 29 08/22/2014   TSH 1.75 08/22/2014   PSA 1.11 08/22/2014   HGBA1C 8.7 01/03/2015   MICROALBUR <0.7 08/22/2014    No results found.  Assessment & Plan:   Kenneth Pace was seen today for sinusitis and cough.  Diagnoses and all orders for this visit:  Eustachian tube dysfunction, unspecified laterality- I advised him to continue taking Sudafed, he has severe symptoms so will also  try a course of systemic steroids -     methylPREDNISolone (MEDROL DOSEPAK) 4 MG TBPK tablet; TAKE AS DIRECTED  Acute maxillary sinusitis, recurrence not specified- will treat the infection with Augmentin and control his symptoms with Tussionex suspension. -     amoxicillin-clavulanate (AUGMENTIN) 875-125 MG tablet; Take 1 tablet by mouth 2 (two) times daily. -     chlorpheniramine-HYDROcodone (TUSSIONEX PENNKINETIC ER) 10-8 MG/5ML SUER; Take 5 mLs by mouth every 12 (twelve) hours as needed for cough.   I am having Kenneth Pace start on amoxicillin-clavulanate, chlorpheniramine-HYDROcodone, and methylPREDNISolone. I am also having him maintain his Cranberry, co-enzyme Q-10, (multivitamin,tx-minerals), aspirin, Cyanocobalamin (VITAMIN B-12 CR PO), Misc. Devices, traMADol, glucagon, finasteride, busPIRone, SOF-SENSOR, ezetimibe, glucose blood, insulin lispro, PARADIGM RESERVOIR 3ML, PARADIGM SILHOUETTE COMBO 43", ACCU-CHEK MULTICLIX  LANCET DEV, lisinopril-hydrochlorothiazide, rosuvastatin, and TEGADERM HP.  Meds ordered this encounter  Medications  . amoxicillin-clavulanate (AUGMENTIN) 875-125 MG tablet    Sig: Take 1 tablet by mouth 2 (two) times daily.    Dispense:  20 tablet    Refill:  1  . chlorpheniramine-HYDROcodone (TUSSIONEX PENNKINETIC ER) 10-8 MG/5ML SUER    Sig: Take 5 mLs by mouth every 12 (twelve) hours as needed for cough.    Dispense:  140 mL    Refill:  0  . methylPREDNISolone (MEDROL DOSEPAK) 4 MG TBPK tablet    Sig: TAKE AS DIRECTED    Dispense:  21 tablet    Refill:  0     Follow-up: Return in about 3 weeks (around 03/05/2015).  Scarlette Calico, MD

## 2015-02-14 ENCOUNTER — Other Ambulatory Visit: Payer: Self-pay | Admitting: Endocrinology

## 2015-02-16 ENCOUNTER — Other Ambulatory Visit: Payer: Self-pay | Admitting: Endocrinology

## 2015-02-19 ENCOUNTER — Other Ambulatory Visit: Payer: Self-pay

## 2015-02-21 ENCOUNTER — Encounter: Payer: Self-pay | Admitting: Gastroenterology

## 2015-02-22 ENCOUNTER — Telehealth: Payer: Self-pay | Admitting: Endocrinology

## 2015-02-22 ENCOUNTER — Ambulatory Visit (INDEPENDENT_AMBULATORY_CARE_PROVIDER_SITE_OTHER): Payer: BLUE CROSS/BLUE SHIELD | Admitting: Endocrinology

## 2015-02-22 ENCOUNTER — Encounter: Payer: Self-pay | Admitting: Endocrinology

## 2015-02-22 VITALS — BP 132/70 | HR 102 | Temp 98.1°F | Ht 71.0 in | Wt 229.0 lb

## 2015-02-22 DIAGNOSIS — J0100 Acute maxillary sinusitis, unspecified: Secondary | ICD-10-CM

## 2015-02-22 MED ORDER — DOXYCYCLINE HYCLATE 100 MG PO TABS: 100.0000 mg | ORAL_TABLET | Freq: Two times a day (BID) | ORAL | Status: DC

## 2015-02-22 MED ORDER — PROMETHAZINE-CODEINE 6.25-10 MG/5ML PO SYRP: 5.0000 mL | ORAL_SOLUTION | ORAL | Status: DC | PRN

## 2015-02-22 NOTE — Telephone Encounter (Addendum)
Patients wife called stating that Kenneth Pace was seen by Dr. Jenny Reichmann on 12.27.16 and was prescribed antibiotics and cough syrup He is still having a productive cough with yellow phlegm and does not feel any better   Is there anything else that he can take for this? Also what do you recommend for cough syrup?   Please advise   Call back: [phone]  Thank you

## 2015-02-22 NOTE — Progress Notes (Signed)
Subjective:    Patient ID: Kenneth Pace, male    DOB: 08-10-1950, 65 y.o.   MRN: [id number]  HPI Pt was seen 10 days ago with URI, and was rx'ed with augmentin. Overall, he feels somewhat better.  However, he still has moderate dry-quality cough in the chest, and assoc nasal congestion.   Past Medical History  Diagnosis Date  . DIABETES MELLITUS, TYPE I, UNCONTROLLED 12/29/2006  . HYPERLIPIDEMIA 02/16/2007  . ANXIETY 02/16/2007  . ERECTILE DYSFUNCTION 02/16/2007  . HYPERTENSION 02/16/2007  . TESTICULAR MASS, LEFT 02/16/2007  . ADENOIDECTOMY, HX OF 02/16/2007  . BENIGN PROSTATIC HYPERTROPHY, WITH OBSTRUCTION 02/03/2010    Past Surgical History  Procedure Laterality Date  . Tonsillectomy      Social History   Social History  . Marital Status: Married    Spouse Name: N/A  . Number of Children: N/A  . 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 Smoking status: Never Smoker   . Smokeless tobacco: Not on file  . Alcohol Use: Not on file  . Drug Use: Not on file  . Sexual Activity: Not on file   Other Topics Concern  . Not on file   Social History Narrative    Current Outpatient Prescriptions on File Prior to Visit  Medication Sig Dispense Refill  . aspirin 81 MG tablet Take 81 mg by mouth daily.      . busPIRone (BUSPAR) 30 MG tablet TAKE ONE-HALF (1/2) TABLET (15 MG) TWICE A DAY 180 tablet 2  . co-enzyme Q-10 30 MG capsule Take 30 mg by mouth daily.      . Continuous Glucose Monitor Sup (SOF-SENSOR) MISC 1 Device by Does not apply route 2 (two) times a week. 30 each 5  . Cranberry 400 MG TABS Take 2 each by mouth daily.      . Cyanocobalamin (VITAMIN B-12 CR PO) Take 1 each by mouth daily.      Marland Kitchen ezetimibe (ZETIA) 10 MG tablet Take 1 tablet (10 mg total) by mouth daily. 90 tablet 3  . finasteride (PROSCAR) 5 MG tablet Take by mouth.    Marland Kitchen glucagon 1 MG injection Inject 1 mg into the vein once  as needed. 1 each 12  . glucose blood (ONE TOUCH ULTRA TEST) test strip 5/day dx 250.01, and lancets 450 each 3  . Insulin Infusion Pump Supplies (PARADIGM RESERVOIR 3ML) MISC 1 Device by Does not apply route every 3 (three) days. 30 each 3  . Insulin Infusion Pump Supplies (PARADIGM SILHOUETTE COMBO 43") MISC 1 Device by Does not apply route every 3 (three) days. 30 each 3  . insulin lispro (HUMALOG) 100 UNIT/ML injection INJECT 120 UNITS DAILY IN PUMP 100 mL 1  . Lancets Misc. (ACCU-CHEK MULTICLIX LANCET DEV) KIT 1 Device by Other route 5 (five) times daily as needed. 5/day dx 250.01 450 each 3  . lisinopril-hydrochlorothiazide (PRINZIDE,ZESTORETIC) 10-12.5 MG tablet Take 1 tablet by mouth daily. 90 tablet 2  . Misc. Devices MISC IV 3000 infusion set cover.  Change every 3 days 30 each 3  . Multiple Vitamins-Minerals (MULTIVITAMIN,TX-MINERALS) tablet Take 1 tablet by mouth daily.      . rosuvastatin (CRESTOR) 40 MG tablet TAKE 1 TABLET BY MOUTH EVERY DAY 90 tablet 1  . traMADol (ULTRAM) 50 MG tablet Take 1 tablet (50 mg total) by mouth every 6 (six) hours as needed. 50 tablet  5  . Transparent Dressings (TEGADERM HP) MISC 4x3.  Change every 4 days 25 each 3   No current facility-administered medications on file prior to visit.    No Known Allergies  Family History  Problem Relation Age of Onset  . Cancer Neg Hx     BP 132/70 mmHg  Pulse 102  Temp(Src) 98.1 F (36.7 C)  Ht 5' 11"  (1.803 m)  Wt 229 lb (103.874 kg)  BMI 31.95 kg/m2  SpO2 97%  Review of Systems Denies fever and wheezing.     Objective:   Physical Exam VITAL SIGNS:  See vs page.  GENERAL: no distress. head: no deformity eyes: no periorbital swelling, no proptosis external nose and ears are normal mouth: no lesion seen.  Both eac's and tm's are normal.  LUNGS:  Clear to auscultation.       Assessment & Plan:  URI: persistent.  Patient is advised the following: Patient Instructions  i have sent a  prescription to your pharmacy, for different type of antibiotic pill.   Loratadine-d (non-prescription) will help your congestion.   Here is a prescription for cough syrup.  I hope you feel better soon.  If you don't feel better by next week, please call back.  Please call sooner if you get worse.

## 2015-02-22 NOTE — Telephone Encounter (Signed)
See note below and please advise, Thanks! 

## 2015-02-22 NOTE — Telephone Encounter (Signed)
Ov now please

## 2015-02-22 NOTE — Telephone Encounter (Signed)
Patient scheduled for 4:30 today.

## 2015-02-22 NOTE — Patient Instructions (Addendum)
i have sent a prescription to your pharmacy, for different type of antibiotic pill.   Loratadine-d (non-prescription) will help your congestion.   Here is a prescription for cough syrup.  I hope you feel better soon.  If you don't feel better by next week, please call back.  Please call sooner if you get worse.

## 2015-02-28 NOTE — Telephone Encounter (Signed)
Please review

## 2015-03-01 DIAGNOSIS — N411 Chronic prostatitis: Secondary | ICD-10-CM | POA: Insufficient documentation

## 2015-04-05 ENCOUNTER — Ambulatory Visit (INDEPENDENT_AMBULATORY_CARE_PROVIDER_SITE_OTHER): Payer: BLUE CROSS/BLUE SHIELD | Admitting: Endocrinology

## 2015-04-05 ENCOUNTER — Encounter: Payer: Self-pay | Admitting: Endocrinology

## 2015-04-05 VITALS — BP 134/84 | HR 81 | Temp 97.8°F | Ht 71.0 in | Wt 234.0 lb

## 2015-04-05 DIAGNOSIS — E10319 Type 1 diabetes mellitus with unspecified diabetic retinopathy without macular edema: Secondary | ICD-10-CM | POA: Diagnosis not present

## 2015-04-05 DIAGNOSIS — E103399 Type 1 diabetes mellitus with moderate nonproliferative diabetic retinopathy without macular edema, unspecified eye: Secondary | ICD-10-CM

## 2015-04-05 LAB — POCT GLYCOSYLATED HEMOGLOBIN (HGB A1C): Hemoglobin A1C: 8.4

## 2015-04-05 NOTE — Progress Notes (Signed)
Subjective:    Patient ID: Kenneth Pace, male    DOB: 12-14-1950, 65 y.o.   MRN: [id number]  HPI Pt returns for f/u of diabetes mellitus: DM type: 1 Dx'ed: 1025 Complications: retinopathy.   Therapy: insulin since dx DKA: only at dx Severe hypoglycemia: most recently in 2014. 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  continuous glucose monitor ID is [email or web address] and password is [email or web address] Interval history:  continuous glucose monitor website is down. He takes these pump settings:  basal rate of 2.6 units/hr, except 3.2 units/hr, 3 AM-6 AM.   Mealtime bolus of 1 unit/ 9 grams carbohydrate, but he adds 7 units to his calculated supper bolus. correction bolus of 1 unit for each 50 by which glucose exceeds 100.  Pt says he seldom has hypoglycemia, and these episodes are mild.  This happens on the weekend, with activity, or when a meal is missed.  He says it is highest fasting. He says he takes an average total of approx 125 units per day.   Past Medical History  Diagnosis Date  . DIABETES MELLITUS, TYPE I, UNCONTROLLED 12/29/2006  . HYPERLIPIDEMIA 02/16/2007  . ANXIETY 02/16/2007  . ERECTILE DYSFUNCTION 02/16/2007  . HYPERTENSION 02/16/2007  . TESTICULAR MASS, LEFT 02/16/2007  . ADENOIDECTOMY, HX OF 02/16/2007  . BENIGN PROSTATIC HYPERTROPHY, WITH OBSTRUCTION 02/03/2010    Past Surgical History  Procedure Laterality Date  . Tonsillectomy      Social History   Social History  . Marital Status: Married    Spouse Name: N/A  . Number of Children: N/A  . 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 Smoking status: Never Smoker   . Smokeless tobacco: Not on file  . Alcohol Use: Not on file  . Drug Use: Not on file  . Sexual Activity: Not on file   Other Topics Concern  . Not on file   Social History  Narrative    Current Outpatient Prescriptions on File Prior to Visit  Medication Sig Dispense Refill  . aspirin 81 MG tablet Take 81 mg by mouth daily.      . busPIRone (BUSPAR) 30 MG tablet TAKE ONE-HALF (1/2) TABLET (15 MG) TWICE A DAY 180 tablet 1  . co-enzyme Q-10 30 MG capsule Take 30 mg by mouth daily.      . Continuous Glucose Monitor Sup (SOF-SENSOR) MISC 1 Device by Does not apply route 2 (two) times a week. 30 each 5  . Cranberry 400 MG TABS Take 2 each by mouth daily.      . Cyanocobalamin (VITAMIN B-12 CR PO) Take 1 each by mouth daily.      Marland Kitchen doxycycline (VIBRA-TABS) 100 MG tablet Take 1 tablet (100 mg total) by mouth 2 (two) times daily. 20 tablet 0  . ezetimibe (ZETIA) 10 MG tablet Take 1 tablet (10 mg total) by mouth daily. 90 tablet 3  . finasteride (PROSCAR) 5 MG tablet Take by mouth.    Marland Kitchen glucagon 1 MG injection Inject 1 mg into the vein once as needed. 1 each 12  . glucose blood (ONE TOUCH ULTRA TEST) test strip 5/day dx 250.01, and lancets 450 each 3  . Insulin Infusion Pump Supplies (PARADIGM RESERVOIR 3ML) MISC 1 Device by Does not apply route every 3 (three) days. 30 each 3  . Insulin Infusion  Pump Supplies (PARADIGM SILHOUETTE COMBO 43") MISC 1 Device by Does not apply route every 3 (three) days. 30 each 3  . insulin lispro (HUMALOG) 100 UNIT/ML injection INJECT 120 UNITS DAILY IN PUMP 100 mL 1  . Lancets Misc. (ACCU-CHEK MULTICLIX LANCET DEV) KIT 1 Device by Other route 5 (five) times daily as needed. 5/day dx 250.01 450 each 3  . lisinopril-hydrochlorothiazide (PRINZIDE,ZESTORETIC) 10-12.5 MG tablet Take 1 tablet by mouth daily. 90 tablet 2  . Misc. Devices MISC IV 3000 infusion set cover.  Change every 3 days 30 each 3  . Multiple Vitamins-Minerals (MULTIVITAMIN,TX-MINERALS) tablet Take 1 tablet by mouth daily.      . promethazine-codeine (PHENERGAN WITH CODEINE) 6.25-10 MG/5ML syrup Take 5 mLs by mouth every 4 (four) hours as needed. 240 mL 1  . rosuvastatin  (CRESTOR) 40 MG tablet TAKE 1 TABLET BY MOUTH EVERY DAY 90 tablet 1  . traMADol (ULTRAM) 50 MG tablet Take 1 tablet (50 mg total) by mouth every 6 (six) hours as needed. 50 tablet 5  . Transparent Dressings (TEGADERM HP) MISC 4x3.  Change every 4 days 25 each 3   No current facility-administered medications on file prior to visit.    No Known Allergies  Family History  Problem Relation Age of Onset  . Cancer Neg Hx     BP 134/84 mmHg  Pulse 81  Temp(Src) 97.8 F (36.6 C) (Oral)  Ht 5' 11" (1.803 m)  Wt 234 lb (106.142 kg)  BMI 32.65 kg/m2  SpO2 97%  Review of Systems Denies LOC    Objective:   Physical Exam VITAL SIGNS:  See vs page GENERAL: no distress SKIN:  Insulin infusion sites at the anterior abdomen are normal   Lab Results  Component Value Date   HGBA1C 8.4 04/05/2015      Assessment & Plan:  DM: The pattern of his cbg's indicates he needs some adjustment in his therapy.  Patient is advised the following: Patient Instructions  Please make every effort to eat something at lunch, even if it is a light snack.   Please increase basal rate to 2.6 units/hr, except 4 units/hr, 3 AM-6 AM.   Please increase bolus to 1 unit/ 9 grams carbohydrate, but add 7 units to you calculated supper bolus.   continue correction bolus (which some people call "sensitivity," or "insulin sensitivity ratio," or just "isr") of 1 unit for each 50 by which your glucose exceeds 100.  suspend the pump for 1-2 hrs, with activity.  If not, eat a light snack with it.  Please come back for a follow-up appointment in 3 months.

## 2015-04-05 NOTE — Patient Instructions (Addendum)
Please make every effort to eat something at lunch, even if it is a light snack.   Please increase basal rate to 2.6 units/hr, except 4 units/hr, 3 AM-6 AM.   Please increase bolus to 1 unit/ 9 grams carbohydrate, but add 7 units to you calculated supper bolus.   continue correction bolus (which some people call "sensitivity," or "insulin sensitivity ratio," or just "isr") of 1 unit for each 50 by which your glucose exceeds 100.  suspend the pump for 1-2 hrs, with activity.  If not, eat a light snack with it.  Please come back for a follow-up appointment in 3 months.

## 2015-04-06 DIAGNOSIS — E119 Type 2 diabetes mellitus without complications: Secondary | ICD-10-CM | POA: Insufficient documentation

## 2015-04-26 LAB — HM DIABETES EYE EXAM

## 2015-05-09 ENCOUNTER — Telehealth: Payer: Self-pay | Admitting: Endocrinology

## 2015-05-09 MED ORDER — ROSUVASTATIN CALCIUM 40 MG PO TABS: 40.0000 mg | ORAL_TABLET | Freq: Every day | ORAL | Status: DC

## 2015-05-09 MED ORDER — EZETIMIBE 10 MG PO TABS: 10.0000 mg | ORAL_TABLET | Freq: Every day | ORAL | Status: DC

## 2015-05-09 NOTE — Telephone Encounter (Signed)
Pt wife called and said that they tried to fill a prescription (not sure of name) back in January and was told from [facility] that it was not authorized to fill.  She is very concerned as to why this wasn't filled and wants to talk to you about this. [phone]

## 2015-05-09 NOTE — Telephone Encounter (Signed)
I contacted the pt's wife. She stated the pt needed a refill on zetia and crestor. She stated the medication needed to be sent to [facility]. Refills sent.

## 2015-05-24 DIAGNOSIS — IMO0002 Reserved for concepts with insufficient information to code with codable children: Secondary | ICD-10-CM | POA: Insufficient documentation

## 2015-05-24 DIAGNOSIS — E113293 Type 2 diabetes mellitus with mild nonproliferative diabetic retinopathy without macular edema, bilateral: Secondary | ICD-10-CM | POA: Insufficient documentation

## 2015-06-17 ENCOUNTER — Other Ambulatory Visit: Payer: Self-pay | Admitting: Endocrinology

## 2015-07-05 ENCOUNTER — Ambulatory Visit: Payer: BLUE CROSS/BLUE SHIELD | Admitting: Endocrinology

## 2015-08-21 ENCOUNTER — Ambulatory Visit (INDEPENDENT_AMBULATORY_CARE_PROVIDER_SITE_OTHER): Payer: BLUE CROSS/BLUE SHIELD | Admitting: Endocrinology

## 2015-08-21 ENCOUNTER — Encounter: Payer: Self-pay | Admitting: Endocrinology

## 2015-08-21 VITALS — BP 136/80 | HR 78 | Ht 71.0 in | Wt 236.0 lb

## 2015-08-21 DIAGNOSIS — E10319 Type 1 diabetes mellitus with unspecified diabetic retinopathy without macular edema: Secondary | ICD-10-CM | POA: Diagnosis not present

## 2015-08-21 LAB — POCT GLYCOSYLATED HEMOGLOBIN (HGB A1C): HEMOGLOBIN A1C: 8.4

## 2015-08-21 MED ORDER — GLUCOSE BLOOD VI STRP: ORAL_STRIP | Status: DC

## 2015-08-21 NOTE — Progress Notes (Signed)
Subjective:    Patient ID: Kenneth Pace, male    DOB: 09-26-1950, 65 y.o.   MRN: [id number]  HPI Pt returns for f/u of diabetes mellitus: DM type: 1 Dx'ed: 9390 Complications: retinopathy.   Therapy: insulin since dx DKA: only at dx Severe hypoglycemia: most recently in 2014. 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  continuous glucose monitor ID is [email or web address] and password is [email or web address] Interval history:  He takes these pump settings:  basal rate of 2.6 units/hr, except 4 units/hr, 3 AM-6 AM.   Mealtime bolus of 1 unit/ 9 grams carbohydrate, but he adds 7 units to his calculated supper bolus. correction bolus of 1 unit for each 50 by which glucose exceeds 100.  He says he takes an average total of approx 125 units per day.  He reviewed continuous glucose monitor data together.  It is lowest in the afternoon.  Otherwise, there is no trend throughout the day. Past Medical History  Diagnosis Date  . DIABETES MELLITUS, TYPE I, UNCONTROLLED 12/29/2006  . HYPERLIPIDEMIA 02/16/2007  . ANXIETY 02/16/2007  . ERECTILE DYSFUNCTION 02/16/2007  . HYPERTENSION 02/16/2007  . TESTICULAR MASS, LEFT 02/16/2007  . ADENOIDECTOMY, HX OF 02/16/2007  . BENIGN PROSTATIC HYPERTROPHY, WITH OBSTRUCTION 02/03/2010    Past Surgical History  Procedure Laterality Date  . Tonsillectomy      Social History   Social History  . Marital Status: Married    Spouse Name: N/A  . Number of Children: N/A  . 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 Smoking status: Never Smoker   . Smokeless tobacco: Not on file  . Alcohol Use: Not on file  . Drug Use: Not on file  . Sexual Activity: Not on file   Other Topics Concern  . Not on file   Social History Narrative    Current Outpatient Prescriptions on File Prior to Visit    Medication Sig Dispense Refill  . aspirin 81 MG tablet Take 81 mg by mouth daily.      . busPIRone (BUSPAR) 30 MG tablet TAKE ONE-HALF (1/2) TABLET (15 MG) TWICE A DAY 180 tablet 1  . co-enzyme Q-10 30 MG capsule Take 30 mg by mouth daily.      . Continuous Glucose Monitor Sup (SOF-SENSOR) MISC 1 Device by Does not apply route 2 (two) times a week. 30 each 5  . Cranberry 400 MG TABS Take 2 each by mouth daily.      . Cyanocobalamin (VITAMIN B-12 CR PO) Take 1 each by mouth daily.      Marland Kitchen doxycycline (VIBRA-TABS) 100 MG tablet Take 1 tablet (100 mg total) by mouth 2 (two) times daily. 20 tablet 0  . ezetimibe (ZETIA) 10 MG tablet Take 1 tablet (10 mg total) by mouth daily. 90 tablet 3  . finasteride (PROSCAR) 5 MG tablet Take by mouth.    Marland Kitchen glucagon 1 MG injection Inject 1 mg into the vein once as needed. 1 each 12  . HUMALOG 100 UNIT/ML injection INJECT 120 UNITS DAILY IN PUMP 100 mL 0  . Insulin Infusion Pump Supplies (PARADIGM RESERVOIR 3ML) MISC 1 Device by Does not apply route every 3 (three) days. 30 each 3  . Insulin Infusion Pump Supplies (PARADIGM SILHOUETTE COMBO 43") MISC 1 Device by Does not apply route every 3 (three) days.  30 each 3  . Lancets Misc. (ACCU-CHEK MULTICLIX LANCET DEV) KIT 1 Device by Other route 5 (five) times daily as needed. 5/day dx 250.01 450 each 3  . lisinopril-hydrochlorothiazide (PRINZIDE,ZESTORETIC) 10-12.5 MG tablet Take 1 tablet by mouth daily. 90 tablet 2  . Misc. Devices MISC IV 3000 infusion set cover.  Change every 3 days 30 each 3  . Multiple Vitamins-Minerals (MULTIVITAMIN,TX-MINERALS) tablet Take 1 tablet by mouth daily.      . promethazine-codeine (PHENERGAN WITH CODEINE) 6.25-10 MG/5ML syrup Take 5 mLs by mouth every 4 (four) hours as needed. 240 mL 1  . rosuvastatin (CRESTOR) 40 MG tablet Take 1 tablet (40 mg total) by mouth daily. 90 tablet 3  . traMADol (ULTRAM) 50 MG tablet Take 1 tablet (50 mg total) by mouth every 6 (six) hours as needed. 50  tablet 5  . Transparent Dressings (TEGADERM HP) MISC 4x3.  Change every 4 days 25 each 3   No current facility-administered medications on file prior to visit.    No Known Allergies  Family History  Problem Relation Age of Onset  . Cancer Neg Hx     BP 136/80 mmHg  Pulse 78  Ht _0  (1.803 m)  Wt 236 lb (107.049 kg)  BMI 32.93 kg/m2  SpO2 96%  Review of Systems Denies LOC.      Objective:   Physical Exam VITAL SIGNS:  See vs page GENERAL: no distress Pulses: dorsalis pedis intact bilat.   MSK: no deformity of the feet CV: no leg edema Skin:  no ulcer on the feet.  normal color and temp on the feet. Neuro: sensation is intact to touch on the feet.   Ext: There is bilateral onychomycosis of the toenails.     A1c=8.4%      Assessment & Plan:  Type 1 DM: he needs increased rx.    Patient is advised the following: Patient Instructions  Please make every effort to eat something at lunch, even if it is a light snack.   Please continue basal rate to 2.6 units/hr, except 4 units/hr, 3 AM-6 AM.   Please increase bolus to 1 unit/ 8 grams carbohydrate with breakfast, 1 unit/12 units with lunch, and, 1 unit/6 grams with supper continue correction bolus (which some people call "sensitivity," or "insulin sensitivity ratio," or just "isr") of 1 unit for each 50 by which your glucose exceeds 100.  suspend the pump for 1-2 hrs, with activity.  If not, eat a light snack with it.  Please come back for a regular physical appointment in 3 months.     Renato Shin, MD

## 2015-08-21 NOTE — Patient Instructions (Addendum)
Please make every effort to eat something at lunch, even if it is a light snack.   Please continue basal rate to 2.6 units/hr, except 4 units/hr, 3 AM-6 AM.   Please increase bolus to 1 unit/ 8 grams carbohydrate with breakfast, 1 unit/12 units with lunch, and, 1 unit/6 grams with supper continue correction bolus (which some people call "sensitivity," or "insulin sensitivity ratio," or just "isr") of 1 unit for each 50 by which your glucose exceeds 100.  suspend the pump for 1-2 hrs, with activity.  If not, eat a light snack with it.  Please come back for a regular physical appointment in 3 months.

## 2015-09-03 ENCOUNTER — Telehealth: Payer: Self-pay | Admitting: Endocrinology

## 2015-09-03 NOTE — Telephone Encounter (Signed)
I spoke with Mr. Kenneth Pace.  He has called into [employer] and requested a undated insulin pump (670g).  I spoke with Roxanne and she will see if we need a CMN, or if they already have one on file and will let me know.

## 2015-09-03 NOTE — Telephone Encounter (Signed)
Almyra Free from [employer] is waiting on clinicals in order for patient to get his diabetic pump. Please advise Phone # [phone]

## 2015-09-03 NOTE — Telephone Encounter (Signed)
Vaughan Basta, Could you review this message?

## 2015-09-08 ENCOUNTER — Other Ambulatory Visit: Payer: Self-pay | Admitting: Endocrinology

## 2015-09-20 ENCOUNTER — Telehealth: Payer: Self-pay | Admitting: Endocrinology

## 2015-09-20 NOTE — Telephone Encounter (Signed)
Requested a call back from [place] to discuss.

## 2015-09-20 NOTE — Telephone Encounter (Signed)
I contacted the pt's wife. She stated the pt's current pump is malfunctioning and his alarms are not working. Pt's wife has reached out to his insurance and the pump 630 is covered under his insurance but the 670 G is not. 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  Pt was advised per his insurance the pump would not be approved unless a peer to peer takes place. Pt's wife was advised Dr. Loanne Drilling is currently out of town and will not return until 09/30/2015. Pt's wife advised as well that I asked the covered MD to do the peer to peer while Dr. Loanne Drilling was out of town and he declined. Pt and pt's wife voiced understanding of this and was advise as soon as Dr. Loanne Drilling returns he will be notified of the peer to peer and then we will contact the pt. Almyra Free will you please print this for me)

## 2015-09-20 NOTE — Telephone Encounter (Signed)
[employer] called, PT wife said she wants to set up a peer to peer conversation with PT, [employer] and Dr. Loanne Drilling because the insulin pump was denied. [phone] x [phone] ([employer] Rep)

## 2015-09-20 NOTE — Telephone Encounter (Signed)
PT requests call back from you on his cell, did not specify reasoning.

## 2015-10-10 ENCOUNTER — Other Ambulatory Visit: Payer: Self-pay | Admitting: Endocrinology

## 2015-10-23 ENCOUNTER — Telehealth: Payer: Self-pay | Admitting: Nutrition

## 2015-10-23 NOTE — Telephone Encounter (Signed)
Pt was denied the 670 [employer]onic pump and needs the authorization for the 630 G which is out of stock for 30 days.

## 2015-11-05 ENCOUNTER — Telehealth: Payer: Self-pay | Admitting: Endocrinology

## 2015-11-05 NOTE — Telephone Encounter (Signed)
[employer] faxed the rx for the pt's requested new pump  Did we receive the paperwork? Please make this urgent pt has expressed with [employer] that he wants this quickly  # [phone] ext [phone]

## 2015-11-05 NOTE — Telephone Encounter (Signed)
[employer] rep called and said that he needed to talk to you again about a prescription from earlier in the day, he said that there is something incorrect on the form. CB# [phone]

## 2015-11-05 NOTE — Telephone Encounter (Signed)
I contacted that patient's wife and advised we have received the paper work and faxed the paper work back. She voiced understanding.

## 2015-11-06 NOTE — Telephone Encounter (Signed)
I contacted Kenneth Pace with [employer] and left a voicemail requesting a call back to determine what other information needed to be included on the forms.

## 2015-11-25 ENCOUNTER — Ambulatory Visit: Payer: BLUE CROSS/BLUE SHIELD | Admitting: Endocrinology

## 2015-12-03 DIAGNOSIS — E109 Type 1 diabetes mellitus without complications: Secondary | ICD-10-CM | POA: Diagnosis not present

## 2015-12-17 DIAGNOSIS — E109 Type 1 diabetes mellitus without complications: Secondary | ICD-10-CM | POA: Diagnosis not present

## 2015-12-24 ENCOUNTER — Ambulatory Visit (INDEPENDENT_AMBULATORY_CARE_PROVIDER_SITE_OTHER): Payer: BLUE CROSS/BLUE SHIELD | Admitting: Endocrinology

## 2015-12-24 VITALS — BP 134/64 | HR 72 | Wt 237.0 lb

## 2015-12-24 DIAGNOSIS — E10319 Type 1 diabetes mellitus with unspecified diabetic retinopathy without macular edema: Secondary | ICD-10-CM

## 2015-12-24 LAB — POCT GLYCOSYLATED HEMOGLOBIN (HGB A1C): HEMOGLOBIN A1C: 8.7

## 2015-12-24 MED ORDER — SCOPOLAMINE 1 MG/3DAYS TD PT72: 1.0000 | MEDICATED_PATCH | TRANSDERMAL | 12 refills | Status: DC

## 2015-12-24 NOTE — Patient Instructions (Addendum)
Please continue basal rate to 2.6 units/hr, from 11 PM-4 AM, and 4.5 units/hr, at other times No mealtime bolus.  continue correction bolus (which some people call "sensitivity," or "insulin sensitivity ratio," or just "isr") of 1 unit for each 50 by which your glucose exceeds 100.  suspend the pump for 1-2 hrs, with activity.  If not, eat a light snack with it.  On this type of insulin schedule, you should eat meals on a regular schedule.  If a meal is missed or significantly delayed, your blood sugar could go low.  Please come back for a regular physical appointment in 1-2 weeks.  Please see Vaughan Basta the same day.   Here is a prescription for the anti-seasickness patch

## 2015-12-24 NOTE — Progress Notes (Signed)
 Subjective:    Patient ID: Kenneth Pace, male    DOB: 10/16/1950, 65 y.o.   MRN: [id number]  HPI Pt returns for f/u of diabetes mellitus: DM type: 1 Dx'ed: 1984 Complications: retinopathy.   Therapy: insulin since dx DKA: only at dx Severe hypoglycemia: most recently in 2014. 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  continuous glucose monitor ID is [email or web address] and password is [email or web address] Interval history:  He takes these pump settings:  continue basal rate of 2.6 units/hr, except 4 units/hr, 3 AM-6 AM.   of 1 unit/ 8 grams carbohydrate with breakfast, 1 unit/12 units with lunch, and, 1 unit/6 grams with supper correction bolus (which some people call "sensitivity," or "insulin sensitivity ratio," or just "isr") of 1 unit for each 50 by which your glucose exceeds 100.  suspend the pump for 1-2 hrs, with activity.  If not, eat a light snack with it.  He says he takes an average total of approx 120 units per day.  He reviewed continuous glucose monitor data together.   He says business travel will lessen soon, for approx 6 months.  He hopes this will help glycemic control He will go on a cruise soon, and requests rx for seasickness. Past Medical History:  Diagnosis Date  . ADENOIDECTOMY, HX OF 02/16/2007  . ANXIETY 02/16/2007  . BENIGN PROSTATIC HYPERTROPHY, WITH OBSTRUCTION 02/03/2010  . DIABETES MELLITUS, TYPE I, UNCONTROLLED 12/29/2006  . ERECTILE DYSFUNCTION 02/16/2007  . HYPERLIPIDEMIA 02/16/2007  . HYPERTENSION 02/16/2007  . TESTICULAR MASS, LEFT 02/16/2007    Past Surgical History:  Procedure Laterality Date  . TONSILLECTOMY      Social History   Social History  . Marital status: Married    Spouse name: N/A  . Number of children: N/A  . 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 Smoking status: Never Smoker  . Smokeless tobacco: Not on file  . Alcohol use Not on file  . Drug use: Unknown  . Sexual activity: Not on file   Other Topics Concern  . Not on file   Social History Narrative  . No narrative on file    Current Outpatient Prescriptions on File Prior to Visit  Medication Sig Dispense Refill  . aspirin 81 MG tablet Take 81 mg by mouth daily.      . busPIRone (BUSPAR) 30 MG tablet TAKE ONE-HALF (1/2) TABLET (15 MG) TWICE A DAY 180 tablet 1  . co-enzyme Q-10 30 MG capsule Take 30 mg by mouth daily.      . Continuous Glucose Monitor Sup (SOF-SENSOR) MISC 1 Device by Does not apply route 2 (two) times a week. 30 each 5  . Cranberry 400 MG TABS Take 2 each by mouth daily.      . Cyanocobalamin (VITAMIN B-12 CR PO) Take 1 each by mouth daily.      . doxycycline (VIBRA-TABS) 100 MG tablet Take 1 tablet (100 mg total) by mouth 2 (two) times daily. 20 tablet 0  . ezetimibe (ZETIA) 10 MG tablet Take 1 tablet (10 mg total) by mouth daily. 90 tablet 3  . finasteride (PROSCAR) 5 MG tablet Take by mouth.    . glucagon 1 MG injection Inject 1 mg into the vein once as needed. 1 each 12  . glucose blood (ONE TOUCH   ULTRA TEST) test strip 5/day dx 250.01, and lancets 450 each 3  . HUMALOG 100 UNIT/ML injection INJECT 120 UNITS DAILY IN PUMP 100 mL 11  . Insulin Infusion Pump Supplies (PARADIGM RESERVOIR 3ML) MISC 1 Device by Does not apply route every 3 (three) days. 30 each 3  . Insulin Infusion Pump Supplies (PARADIGM SILHOUETTE COMBO 43") MISC 1 Device by Does not apply route every 3 (three) days. 30 each 3  . Lancets Misc. (ACCU-CHEK MULTICLIX LANCET DEV) KIT 1 Device by Other route 5 (five) times daily as needed. 5/day dx 250.01 450 each 3  . lisinopril-hydrochlorothiazide (PRINZIDE,ZESTORETIC) 10-12.5 MG tablet TAKE 1 TABLET DAILY 90 tablet 1  . Misc. Devices MISC IV 3000 infusion set cover.  Change every 3 days 30 each 3  . Multiple Vitamins-Minerals  (MULTIVITAMIN,TX-MINERALS) tablet Take 1 tablet by mouth daily.      . promethazine-codeine (PHENERGAN WITH CODEINE) 6.25-10 MG/5ML syrup Take 5 mLs by mouth every 4 (four) hours as needed. 240 mL 1  . rosuvastatin (CRESTOR) 40 MG tablet Take 1 tablet (40 mg total) by mouth daily. 90 tablet 3  . traMADol (ULTRAM) 50 MG tablet Take 1 tablet (50 mg total) by mouth every 6 (six) hours as needed. 50 tablet 5  . Transparent Dressings (TEGADERM HP) MISC 4x3.  Change every 4 days 25 each 3   No current facility-administered medications on file prior to visit.     No Known Allergies  Family History  Problem Relation Age of Onset  . Cancer Neg Hx     BP 134/64   Pulse 72   Wt 237 lb (107.5 kg)   SpO2 97%   BMI 33.05 kg/m    Review of Systems Denies LOC    Objective:   Physical Exam VITAL SIGNS:  See vs page GENERAL: no distress Pulses: dorsalis pedis intact bilat.   MSK: no deformity of the feet CV: no leg edema Skin:  no ulcer on the feet.  normal color and temp on the feet. Neuro: sensation is intact to touch on the feet.   Ext: There is bilateral onychomycosis of the toenails.   A1c=8.7%     Assessment & Plan:  Type 1 DM, with retinopathy: worse.  We'll try emphasizing the basal rate.  Seasickness, new.   Patient is advised the following: Patient Instructions  Please continue basal rate to 2.6 units/hr, from 11 PM-4 AM, and 4.5 units/hr, at other times No mealtime bolus.  continue correction bolus (which some people call "sensitivity," or "insulin sensitivity ratio," or just "isr") of 1 unit for each 50 by which your glucose exceeds 100.  suspend the pump for 1-2 hrs, with activity.  If not, eat a light snack with it.  On this type of insulin schedule, you should eat meals on a regular schedule.  If a meal is missed or significantly delayed, your blood sugar could go low.  Please come back for a regular physical appointment in 1-2 weeks.  Please see Linda the same day.     Here is a prescription for the anti-seasickness patch   

## 2015-12-25 DIAGNOSIS — E113492 Type 2 diabetes mellitus with severe nonproliferative diabetic retinopathy without macular edema, left eye: Secondary | ICD-10-CM | POA: Diagnosis not present

## 2015-12-25 DIAGNOSIS — H2513 Age-related nuclear cataract, bilateral: Secondary | ICD-10-CM | POA: Diagnosis not present

## 2015-12-25 DIAGNOSIS — E113412 Type 2 diabetes mellitus with severe nonproliferative diabetic retinopathy with macular edema, left eye: Secondary | ICD-10-CM | POA: Diagnosis not present

## 2015-12-25 DIAGNOSIS — E113291 Type 2 diabetes mellitus with mild nonproliferative diabetic retinopathy without macular edema, right eye: Secondary | ICD-10-CM | POA: Diagnosis not present

## 2016-01-05 NOTE — Progress Notes (Deleted)
   Subjective:    Patient ID: Kenneth Pace, male    DOB: [birth year], 65 y.o.   MRN: [id number]  HPI   Review of Systems Constitutional: Negative for fever.  HENT: Negative for hearing loss.   Eyes: Negative for visual disturbance.  Respiratory: Negative for shortness of breath.   Cardiovascular: Negative for chest pain.  Gastrointestinal: Negative for anal bleeding.  Endocrine: Negative for cold intolerance.  Genitourinary: Negative for hematuria and difficulty urinating.  Musculoskeletal: Negative for gait problem.  Skin: Negative for rash.  Allergic/Immunologic: Negative for environmental allergies.  Neurological: Negative for numbness.  Hematological: Does not bruise/bleed easily.  Psychiatric/Behavioral: Negative for dysphoric mood.     Objective:   Physical Exam VS: see vs page GEN: no distress HEAD: head: no deformity eyes: no periorbital swelling, no proptosis external nose and ears are normal mouth: no lesion seen NECK: supple, thyroid is not enlarged CHEST WALL: no deformity LUNGS: clear to auscultation BREASTS:  No gynecomastia CV: reg rate and rhythm, no murmur ABD: abdomen is soft, nontender.  no hepatosplenomegaly.  not distended.  no hernia GENITALIA/RECTAL/PROSTATE: sees urology MUSCULOSKELETAL: muscle bulk and strength are grossly normal.  no obvious joint swelling.  gait is normal and steady EXTEMITIES: no deformity.  no ulcer on the feet.  feet are of normal color and temp.  Trace bilat leg edema.  There is bilateral onychomycosis of the toenails.  PULSES: dorsalis pedis intact bilat.  no carotid bruit NEURO:  cn 2-12 grossly intact.   readily moves all 4's.  sensation is intact to touch on the feet SKIN:  Normal texture and temperature.  No rash or suspicious lesion is visible.  SKIN:  Insulin infusion and continuous glucose monitor sites at the anterior abdomen are normal NODES:  None palpable at the neck PSYCH: alert, well-oriented.  Does not  appear anxious nor depressed.        Assessment & Plan:

## 2016-01-07 ENCOUNTER — Encounter: Payer: BLUE CROSS/BLUE SHIELD | Admitting: Nutrition

## 2016-01-07 ENCOUNTER — Ambulatory Visit: Payer: BLUE CROSS/BLUE SHIELD | Admitting: Endocrinology

## 2016-01-19 NOTE — Progress Notes (Signed)
Subjective:    Patient ID: Kenneth Pace, male    DOB: 03/26/1950, 65 y.o.   MRN: [id number]  HPI Pt returns for f/u of diabetes mellitus: DM type: 1 Dx'ed: 0355 Complications: retinopathy.   Therapy: insulin since dx DKA: only at dx Severe hypoglycemia: most recently in 2014. 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  continuous glucose monitor ID is [email or web address] and password is [email or web address] Interval history:  He has been prescribed these pump settings:  basal rate of 2.6 units/hr, from 11 PM-4 AM, and 4.5 units/hr, at other times.  No mealtime bolus.   suspends the pump for 1-2 hrs, with activity.  If not, eat a light snack with it.  correction bolus (which some people call "sensitivity," or "insulin sensitivity ratio," or just "isr") of 1 unit for each 50 by which your glucose exceeds 100.  He says he takes an average total of approx 130 units per day.  Pt changed the basal rate to 4.5 units/hr, 24 hrs per day.  He still takes tid mealtime boluses.  no cbg record, but states cbg's are mildly low approx once per week.  This usually happens in the afternoon.  Pt reports 4 days of moderate prod-quality cough in the chest, and assoc wheezing.    Past Medical History:  Diagnosis Date  . ADENOIDECTOMY, HX OF 02/16/2007  . ANXIETY 02/16/2007  . BENIGN PROSTATIC HYPERTROPHY, WITH OBSTRUCTION 02/03/2010  . DIABETES MELLITUS, TYPE I, UNCONTROLLED 12/29/2006  . ERECTILE DYSFUNCTION 02/16/2007  . HYPERLIPIDEMIA 02/16/2007  . HYPERTENSION 02/16/2007  . TESTICULAR MASS, LEFT 02/16/2007    Past Surgical History:  Procedure Laterality Date  . TONSILLECTOMY      Social History   Social History  . Marital status: Married    Spouse name: N/A  . Number of children: N/A  . 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   Social  History Main Topics  . Smoking status: Never Smoker  . Smokeless tobacco: Not on file  . Alcohol use Not on file  . Drug use: Unknown  . Sexual activity: Not on file   Other Topics Concern  . Not on file   Social History Narrative  . No narrative on file    Current Outpatient Prescriptions on File Prior to Visit  Medication Sig Dispense Refill  . aspirin 81 MG tablet Take 81 mg by mouth daily.      . busPIRone (BUSPAR) 30 MG tablet TAKE ONE-HALF (1/2) TABLET (15 MG) TWICE A DAY 180 tablet 1  . co-enzyme Q-10 30 MG capsule Take 30 mg by mouth daily.      . Continuous Glucose Monitor Sup (SOF-SENSOR) MISC 1 Device by Does not apply route 2 (two) times a week. 30 each 5  . Cranberry 400 MG TABS Take 2 each by mouth daily.      . Cyanocobalamin (VITAMIN B-12 CR PO) Take 1 each by mouth daily.      Marland Kitchen doxycycline (VIBRA-TABS) 100 MG tablet Take 1 tablet (100 mg total) by mouth 2 (two) times daily. 20 tablet 0  . ezetimibe (ZETIA) 10 MG tablet Take 1 tablet (10 mg total) by mouth daily. 90 tablet 3  . finasteride (PROSCAR) 5 MG tablet Take by mouth.    Marland Kitchen glucagon 1 MG injection Inject 1 mg into the vein once as needed. 1  each 12  . glucose blood (ONE TOUCH ULTRA TEST) test strip 5/day dx 250.01, and lancets 450 each 3  . HUMALOG 100 UNIT/ML injection INJECT 120 UNITS DAILY IN PUMP 100 mL 11  . Lancets Misc. (ACCU-CHEK MULTICLIX LANCET DEV) KIT 1 Device by Other route 5 (five) times daily as needed. 5/day dx 250.01 450 each 3  . lisinopril-hydrochlorothiazide (PRINZIDE,ZESTORETIC) 10-12.5 MG tablet TAKE 1 TABLET DAILY 90 tablet 1  . Misc. Devices MISC IV 3000 infusion set cover.  Change every 3 days 30 each 3  . Multiple Vitamins-Minerals (MULTIVITAMIN,TX-MINERALS) tablet Take 1 tablet by mouth daily.      . rosuvastatin (CRESTOR) 40 MG tablet Take 1 tablet (40 mg total) by mouth daily. 90 tablet 3  . traMADol (ULTRAM) 50 MG tablet Take 1 tablet (50 mg total) by mouth every 6 (six) hours as  needed. 50 tablet 5  . Transparent Dressings (TEGADERM HP) MISC 4x3.  Change every 4 days 25 each 3   No current facility-administered medications on file prior to visit.     No Known Allergies  Family History  Problem Relation Age of Onset  . Cancer Neg Hx     BP 116/68   Pulse (!) 101   Wt 237 lb (107.5 kg)   SpO2 96%   BMI 33.05 kg/m   Review of Systems Denies LOC and fever.     Objective:   Physical Exam VITAL SIGNS:  See vs page GENERAL: no distress head: no deformity  eyes: no periorbital swelling, no proptosis  external nose and ears are normal  mouth: no lesion seen right TM is red.  Left is normal.   Pulses: dorsalis pedis intact bilat.   MSK: no deformity of the feet.  CV: no leg edema.  Skin:  no ulcer on the feet.  normal color and temp on the feet. Neuro: sensation is intact to touch on the feet.  Ext: There is bilateral onychomycosis of the toenails.       Assessment & Plan:  Type 1 DM: he needs increased rx URI: new Noncompliance with pump settings: we discussed reasons for the recent change Patient is advised the following: Patient Instructions  Please take these pump settings: basal rate of 4 units/hr from 11 PM-4 AM, and 5 units/hr, at other times No mealtime bolus.   continue correction bolus (which some people call "sensitivity," or "insulin sensitivity ratio," or just "isr") of 1 unit for each 50 by which your glucose exceeds 100.  suspend the pump for 1-2 hrs, with activity.  If not, eat a light snack with it.   On this type of insulin schedule, you should eat meals on a regular schedule.  If a meal is missed or significantly delayed, your blood sugar could go low.   Please feel free to upload and let us know sooner.   I have sent a prescription to your pharmacy, for an antibiotic and inhaler.  Here is a prescription, for cough syrup.  Please come back for a regular physical appointment in 6 weeks.

## 2016-01-21 ENCOUNTER — Ambulatory Visit (INDEPENDENT_AMBULATORY_CARE_PROVIDER_SITE_OTHER): Payer: BLUE CROSS/BLUE SHIELD | Admitting: Endocrinology

## 2016-01-21 ENCOUNTER — Encounter: Payer: BLUE CROSS/BLUE SHIELD | Attending: Endocrinology | Admitting: Nutrition

## 2016-01-21 VITALS — BP 116/68 | HR 101 | Wt 237.0 lb

## 2016-01-21 DIAGNOSIS — E0800 Diabetes mellitus due to underlying condition with hyperosmolarity without nonketotic hyperglycemic-hyperosmolar coma (NKHHC): Secondary | ICD-10-CM

## 2016-01-21 MED ORDER — FLUTICASONE-SALMETEROL 100-50 MCG/DOSE IN AEPB: 1.0000 | INHALATION_SPRAY | Freq: Two times a day (BID) | RESPIRATORY_TRACT | 3 refills | Status: DC

## 2016-01-21 MED ORDER — PARADIGM PUMP RESERVOIR 3ML MISC: 1.0000 | 3 refills | Status: DC

## 2016-01-21 MED ORDER — "PARADIGM SILHOUETTE COMBO 43"" MISC": 1.0000 | 3 refills | Status: DC

## 2016-01-21 MED ORDER — AZITHROMYCIN 500 MG PO TABS: 500.0000 mg | ORAL_TABLET | Freq: Every day | ORAL | 0 refills | Status: DC

## 2016-01-21 MED ORDER — PROMETHAZINE-CODEINE 6.25-10 MG/5ML PO SYRP: 5.0000 mL | ORAL_SOLUTION | ORAL | 1 refills | Status: DC | PRN

## 2016-01-21 NOTE — Patient Instructions (Addendum)
Please take these pump settings: basal rate of 4 units/hr from 11 PM-4 AM, and 5 units/hr, at other times No mealtime bolus.   continue correction bolus (which some people call "sensitivity," or "insulin sensitivity ratio," or just "isr") of 1 unit for each 50 by which your glucose exceeds 100.  suspend the pump for 1-2 hrs, with activity.  If not, eat a light snack with it.   On this type of insulin schedule, you should eat meals on a regular schedule.  If a meal is missed or significantly delayed, your blood sugar could go low.   Please feel free to upload and let us know sooner.   I have sent a prescription to your pharmacy, for an antibiotic and inhaler.  Here is a prescription, for cough syrup.  Please come back for a regular physical appointment in 6 weeks.

## 2016-01-22 NOTE — Patient Instructions (Signed)
Read over book on 630G instructions.  Call Medtronic and sign up for [facility].  Telephone number on the end of the pump.

## 2016-01-22 NOTE — Progress Notes (Signed)
Pt. And his wife are here to learn about the 630G pump.  He is currently on a Paradigm insulin pump, and is transfering to a 630G.  Setting taken from old pump and put into the new pump:  Basal rate: 4.0u/hr, I/C ratio: MN: 8, 11:30AM: 12, 4:30PM: 6,   ISF:50, target 90-100.  Timing: 4 hours.  We reviewed how to give a bolus, and he redemonstrated this X2 correctly.  He does not do duel wave boluses, or temp. Basal rates.  He was shown how/when to use the temp. Basal rates, and he and his wife reported good understanding of this.   He filled, inserted and primed a new resourvour and infusion set with very little assistance from me.    We linked his meter to his pump.  He is currently using a bayer meter, so no instructions were needed for this. He is also wearing a sensor, and was shown how to insert and tape the new sensor.  They both reported good understanding of this, with no questions.  He inserted the sensor following the instructions in the manual, and they did this correctly, as well as taping it down correctly.   Sensor settings:  Low alarm: 85mg , , and alert before low on.  Also suspend on low is also on. High alert: 250.  Alert on high is on.   They were both encouraged to read the book on transfering to 630G with instructions on where to find the different menus, and how to change infusion sets, and settings in the pump.  They were shown this, and both agreed to read this. They had no final questions.  After seeing Dr. Loanne Drilling, changes were made to the basal rate: of 5.0u/hr, with no boluses given, and only correction doses given.  He was shown how to do this, and had reported good understanding of how to do this, with no questions.

## 2016-01-29 ENCOUNTER — Other Ambulatory Visit: Payer: Self-pay

## 2016-01-29 MED ORDER — GLUCOSE BLOOD VI STRP: ORAL_STRIP | 2 refills | Status: DC

## 2016-02-04 ENCOUNTER — Telehealth: Payer: Self-pay | Admitting: Endocrinology

## 2016-02-04 NOTE — Telephone Encounter (Signed)
[place] from [facility] [place]  insurance will no longer tcover glucose blood (BAYER CONTOUR NEXT TEST) test strip  Other alternative  One touch ultra bleu One touch verio Free style lyte  Ref # [id number]  Phone # [phone]

## 2016-02-05 NOTE — Telephone Encounter (Signed)
The Bayer test strips the patient is using is needed because the strips connect to his insulin pump. PA submitted for Bayer Test Strips to [facility].

## 2016-03-05 ENCOUNTER — Ambulatory Visit: Payer: BLUE CROSS/BLUE SHIELD | Admitting: Endocrinology

## 2016-03-16 DIAGNOSIS — N138 Other obstructive and reflux uropathy: Secondary | ICD-10-CM | POA: Diagnosis not present

## 2016-03-16 DIAGNOSIS — N528 Other male erectile dysfunction: Secondary | ICD-10-CM | POA: Diagnosis not present

## 2016-03-16 DIAGNOSIS — N401 Enlarged prostate with lower urinary tract symptoms: Secondary | ICD-10-CM | POA: Diagnosis not present

## 2016-03-16 DIAGNOSIS — N411 Chronic prostatitis: Secondary | ICD-10-CM | POA: Diagnosis not present

## 2016-03-18 ENCOUNTER — Ambulatory Visit (INDEPENDENT_AMBULATORY_CARE_PROVIDER_SITE_OTHER): Payer: BLUE CROSS/BLUE SHIELD | Admitting: Endocrinology

## 2016-03-18 ENCOUNTER — Encounter: Payer: Self-pay | Admitting: Endocrinology

## 2016-03-18 VITALS — BP 132/80 | HR 83 | Ht 71.0 in | Wt 242.0 lb

## 2016-03-18 DIAGNOSIS — Z125 Encounter for screening for malignant neoplasm of prostate: Secondary | ICD-10-CM

## 2016-03-18 DIAGNOSIS — R200 Anesthesia of skin: Secondary | ICD-10-CM | POA: Diagnosis not present

## 2016-03-18 DIAGNOSIS — Z0000 Encounter for general adult medical examination without abnormal findings: Secondary | ICD-10-CM

## 2016-03-18 DIAGNOSIS — E0800 Diabetes mellitus due to underlying condition with hyperosmolarity without nonketotic hyperglycemic-hyperosmolar coma (NKHHC): Secondary | ICD-10-CM | POA: Diagnosis not present

## 2016-03-18 LAB — MICROALBUMIN / CREATININE URINE RATIO
CREATININE, U: 184.2 mg/dL
Microalb Creat Ratio: 1 mg/g (ref 0.0–30.0)
Microalb, Ur: 1.9 mg/dL (ref 0.0–1.9)

## 2016-03-18 LAB — CBC WITH DIFFERENTIAL/PLATELET
BASOS PCT: 0.6 % (ref 0.0–3.0)
Basophils Absolute: 0 10*3/uL (ref 0.0–0.1)
EOS PCT: 2.9 % (ref 0.0–5.0)
Eosinophils Absolute: 0.2 10*3/uL (ref 0.0–0.7)
HCT: 47.8 % (ref 39.0–52.0)
HEMOGLOBIN: 16.9 g/dL (ref 13.0–17.0)
Lymphocytes Relative: 22 % (ref 12.0–46.0)
Lymphs Abs: 1.8 10*3/uL (ref 0.7–4.0)
MCHC: 35.4 g/dL (ref 30.0–36.0)
MCV: 87 fl (ref 78.0–100.0)
MONO ABS: 0.6 10*3/uL (ref 0.1–1.0)
MONOS PCT: 7.7 % (ref 3.0–12.0)
Neutro Abs: 5.4 10*3/uL (ref 1.4–7.7)
Neutrophils Relative %: 66.8 % (ref 43.0–77.0)
Platelets: 192 10*3/uL (ref 150.0–400.0)
RBC: 5.49 Mil/uL (ref 4.22–5.81)
RDW: 13.1 % (ref 11.5–15.5)
WBC: 8.1 10*3/uL (ref 4.0–10.5)

## 2016-03-18 LAB — URINALYSIS, ROUTINE W REFLEX MICROSCOPIC
Bilirubin Urine: NEGATIVE
HGB URINE DIPSTICK: NEGATIVE
Ketones, ur: NEGATIVE
Leukocytes, UA: NEGATIVE
NITRITE: NEGATIVE
SPECIFIC GRAVITY, URINE: 1.02 (ref 1.000–1.030)
Total Protein, Urine: NEGATIVE
Urine Glucose: NEGATIVE
Urobilinogen, UA: 1 (ref 0.0–1.0)
pH: 6 (ref 5.0–8.0)

## 2016-03-18 LAB — LIPID PANEL
CHOL/HDL RATIO: 3
CHOLESTEROL: 119 mg/dL (ref 0–200)
HDL: 34.8 mg/dL — ABNORMAL LOW (ref >39.00)
NonHDL: 84.33
TRIGLYCERIDES: 209 mg/dL — AB (ref 0.0–149.0)
VLDL: 41.8 mg/dL — AB (ref 0.0–40.0)

## 2016-03-18 LAB — HEPATIC FUNCTION PANEL
ALK PHOS: 182 U/L — AB (ref 39–117)
ALT: 26 U/L (ref 0–53)
AST: 19 U/L (ref 0–37)
Albumin: 4.3 g/dL (ref 3.5–5.2)
BILIRUBIN DIRECT: 0.2 mg/dL (ref 0.0–0.3)
BILIRUBIN TOTAL: 0.7 mg/dL (ref 0.2–1.2)
Total Protein: 6.7 g/dL (ref 6.0–8.3)

## 2016-03-18 LAB — BASIC METABOLIC PANEL
BUN: 19 mg/dL (ref 6–23)
CHLORIDE: 104 meq/L (ref 96–112)
CO2: 29 meq/L (ref 19–32)
Calcium: 9.3 mg/dL (ref 8.4–10.5)
Creatinine, Ser: 0.96 mg/dL (ref 0.40–1.50)
GFR: 83.91 mL/min (ref >60.00)
GLUCOSE: 115 mg/dL — AB (ref 70–99)
POTASSIUM: 3.9 meq/L (ref 3.5–5.1)
Sodium: 139 mEq/L (ref 135–145)

## 2016-03-18 LAB — VITAMIN B12: Vitamin B-12: 376 pg/mL (ref 211–911)

## 2016-03-18 LAB — LDL CHOLESTEROL, DIRECT: LDL DIRECT: 66 mg/dL

## 2016-03-18 LAB — PSA: PSA: 1.63 ng/mL (ref 0.10–4.00)

## 2016-03-18 LAB — TSH: TSH: 1.74 u[IU]/mL (ref 0.35–4.50)

## 2016-03-18 LAB — POCT GLYCOSYLATED HEMOGLOBIN (HGB A1C): Hemoglobin A1C: 7.7

## 2016-03-18 MED ORDER — GLUCOSE BLOOD VI STRP: ORAL_STRIP | 3 refills | Status: DC

## 2016-03-18 MED ORDER — QUICK-SERTER INSERTION DEVICE MISC: 1.0000 | Freq: Once | 0 refills | Status: DC

## 2016-03-18 MED ORDER — QUICK-SERTER INSERTION DEVICE MISC: 1.0000 | Freq: Once | 0 refills | Status: AC

## 2016-03-18 MED ORDER — "QUICK-SET INFUSION 43"" 9MM MISC": 1.0000 | 3 refills | Status: DC

## 2016-03-18 MED ORDER — ENLITE GLUCOSE SENSOR MISC: 1.0000 | 3 refills | Status: DC

## 2016-03-18 NOTE — Progress Notes (Signed)
Subjective:    Patient ID: Kenneth Pace, male    DOB: Nov 17, 1950, 66 y.o.   MRN: [id number]  HPI Pt is here for regular wellness examination, and is feeling pretty well in general, and says chronic med probs are stable, except as noted below Past Medical History:  Diagnosis Date  . ADENOIDECTOMY, HX OF 02/16/2007  . ANXIETY 02/16/2007  . BENIGN PROSTATIC HYPERTROPHY, WITH OBSTRUCTION 02/03/2010  . DIABETES MELLITUS, TYPE I, UNCONTROLLED 12/29/2006  . ERECTILE DYSFUNCTION 02/16/2007  . HYPERLIPIDEMIA 02/16/2007  . HYPERTENSION 02/16/2007  . TESTICULAR MASS, LEFT 02/16/2007    Past Surgical History:  Procedure Laterality Date  . TONSILLECTOMY      Social History   Social History  . Marital status: Married    Spouse name: N/A  . Number of children: N/A  . 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 Smoking status: Never Smoker  . Smokeless tobacco: Never Used  . Alcohol use Not on file  . Drug use: Unknown  . Sexual activity: Not on file   Other Topics Concern  . Not on file   Social History Narrative  . No narrative on file    Current Outpatient Prescriptions on File Prior to Visit  Medication Sig Dispense Refill  . aspirin 81 MG tablet Take 81 mg by mouth daily.      . busPIRone (BUSPAR) 30 MG tablet TAKE ONE-HALF (1/2) TABLET (15 MG) TWICE A DAY 180 tablet 1  . co-enzyme Q-10 30 MG capsule Take 30 mg by mouth daily.      . Cranberry 400 MG TABS Take 2 each by mouth daily.      . Cyanocobalamin (VITAMIN B-12 CR PO) Take 1 each by mouth daily.      Marland Kitchen ezetimibe (ZETIA) 10 MG tablet Take 1 tablet (10 mg total) by mouth daily. 90 tablet 3  . finasteride (PROSCAR) 5 MG tablet Take by mouth.    . Fluticasone-Salmeterol (ADVAIR) 100-50 MCG/DOSE AEPB Inhale 1 puff into the lungs 2 (two) times daily. 1 each 3  . glucagon 1 MG injection Inject 1 mg into the vein once as  needed. 1 each 12  . HUMALOG 100 UNIT/ML injection INJECT 120 UNITS DAILY IN PUMP 100 mL 11  . Insulin Infusion Pump Supplies (PARADIGM RESERVOIR 3ML) MISC 1 Device by Does not apply route every 3 (three) days. 30 each 3  . Lancets Misc. (ACCU-CHEK MULTICLIX LANCET DEV) KIT 1 Device by Other route 5 (five) times daily as needed. 5/day dx 250.01 450 each 3  . lisinopril-hydrochlorothiazide (PRINZIDE,ZESTORETIC) 10-12.5 MG tablet TAKE 1 TABLET DAILY 90 tablet 1  . Misc. Devices MISC IV 3000 infusion set cover.  Change every 3 days 30 each 3  . Multiple Vitamins-Minerals (MULTIVITAMIN,TX-MINERALS) tablet Take 1 tablet by mouth daily.      . rosuvastatin (CRESTOR) 40 MG tablet Take 1 tablet (40 mg total) by mouth daily. 90 tablet 3  . traMADol (ULTRAM) 50 MG tablet Take 1 tablet (50 mg total) by mouth every 6 (six) hours as needed. 50 tablet 5  . Transparent Dressings (TEGADERM HP) MISC 4x3.  Change every 4 days 25 each 3   No current facility-administered medications on file prior to visit.     No Known Allergies  Family History  Problem Relation Age of Onset  . Cancer Neg Hx  BP 132/80   Pulse 83   Ht 5' 11"  (1.803 m)   Wt 242 lb (109.8 kg)   SpO2 97%   BMI 33.75 kg/m     Review of Systems Review of Systems  Constitutional: Negative for fever.  HENT: positive for hearing loss.   Eyes: Negative for visual disturbance.  Respiratory: Negative for shortness of breath.   Cardiovascular: Negative for chest pain.  Gastrointestinal: Negative for anal bleeding.  Endocrine: Negative for cold intolerance.  Genitourinary: Negative for hematuria and difficulty urinating.  Musculoskeletal: Negative for gait problem.  Skin: Negative for rash.  Allergic/Immunologic: Negative for environmental allergies.  Neurological: Negative for numbness.  Hematological: Does not bruise/bleed easily.  Psychiatric/Behavioral: Negative for dysphoric mood.     Objective:   Physical Exam VS: see vs  page GEN: no distress HEAD: head: no deformity eyes: no periorbital swelling, no proptosis external nose and ears are normal mouth: no lesion seen NECK: supple, thyroid is not enlarged CHEST WALL: no deformity LUNGS: clear to auscultation BREASTS:  No gynecomastia CV: reg rate and rhythm, no murmur ABD: abdomen is soft, nontender.  no hepatosplenomegaly.  not distended.  no hernia GENITALIA/RECTAL/PROSTATE: sees urology MUSCULOSKELETAL: muscle bulk and strength are grossly normal.  no obvious joint swelling.  gait is normal and steady PULSES: no carotid bruit NEURO:  cn 2-12 grossly intact. readily moves all 4's.  SKIN:  Normal texture and temperature.  No rash or suspicious lesion is visible.  Insulin infusion and continuous glucose monitor sites at the anterior abdomen are normal. NODES:  None palpable at the neck PSYCH: alert, well-oriented.  Does not appear anxious nor depressed.   I personally reviewed electrocardiogram tracing (today):  Indication: DM Impression: NSR.  No MI.  No hypertrophy.  Compared to 2016: no change.       Assessment & Plan:  Wellness visit today, with problems stable, except as noted.   SEPARATE EVALUATION FOLLOWS--EACH PROBLEM HERE IS NEW, NOT RESPONDING TO TREATMENT, OR POSES SIGNIFICANT RISK TO THE PATIENT'S HEALTH: HISTORY OF THE PRESENT ILLNESS: Pt returns for f/u of diabetes mellitus: DM type: 1 Dx'ed: 7846 Complications: retinopathy.   Therapy: insulin since dx DKA: only at dx Severe hypoglycemia: most recently in 2014. 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  continuous glucose monitor ID is [email or web address] and password is [email or web address] Interval history:   He has been prescribed these pump settings:  basal rate of 4 units/hr, except 5 units/hr, 3AM-6AM.   No mealtime bolus.   continue correction bolus (which some people call "sensitivity," or  "insulin sensitivity ratio," or just "isr") of 1 unit for each 50 by which your glucose exceeds 100.  suspend the pump for 1-2 hrs, with activity.  If not, eat a light snack with it.   He says glucose per continuous glucose monitor is much better recently.  We reviewed continuous glucose monitor download info together.   PAST MEDICAL HISTORY Past Medical History:  Diagnosis Date  . ADENOIDECTOMY, HX OF 02/16/2007  . ANXIETY 02/16/2007  . BENIGN PROSTATIC HYPERTROPHY, WITH OBSTRUCTION 02/03/2010  . DIABETES MELLITUS, TYPE I, UNCONTROLLED 12/29/2006  . ERECTILE DYSFUNCTION 02/16/2007  . HYPERLIPIDEMIA 02/16/2007  . HYPERTENSION 02/16/2007  . TESTICULAR MASS, LEFT 02/16/2007    Past Surgical History:  Procedure Laterality Date  . TONSILLECTOMY      Social History   Social History  . Marital status: Married    Spouse name:  N/A  . Number of children: N/A  . 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 Smoking status: Never Smoker  . Smokeless tobacco: Never Used  . Alcohol use Not on file  . Drug use: Unknown  . Sexual activity: Not on file   Other Topics Concern  . Not on file   Social History Narrative  . No narrative on file    Current Outpatient Prescriptions on File Prior to Visit  Medication Sig Dispense Refill  . aspirin 81 MG tablet Take 81 mg by mouth daily.      . busPIRone (BUSPAR) 30 MG tablet TAKE ONE-HALF (1/2) TABLET (15 MG) TWICE A DAY 180 tablet 1  . co-enzyme Q-10 30 MG capsule Take 30 mg by mouth daily.      . Cranberry 400 MG TABS Take 2 each by mouth daily.      . Cyanocobalamin (VITAMIN B-12 CR PO) Take 1 each by mouth daily.      Marland Kitchen ezetimibe (ZETIA) 10 MG tablet Take 1 tablet (10 mg total) by mouth daily. 90 tablet 3  . finasteride (PROSCAR) 5 MG tablet Take by mouth.    . Fluticasone-Salmeterol (ADVAIR) 100-50 MCG/DOSE AEPB Inhale 1 puff into the lungs 2  (two) times daily. 1 each 3  . glucagon 1 MG injection Inject 1 mg into the vein once as needed. 1 each 12  . HUMALOG 100 UNIT/ML injection INJECT 120 UNITS DAILY IN PUMP 100 mL 11  . Insulin Infusion Pump Supplies (PARADIGM RESERVOIR 3ML) MISC 1 Device by Does not apply route every 3 (three) days. 30 each 3  . Lancets Misc. (ACCU-CHEK MULTICLIX LANCET DEV) KIT 1 Device by Other route 5 (five) times daily as needed. 5/day dx 250.01 450 each 3  . lisinopril-hydrochlorothiazide (PRINZIDE,ZESTORETIC) 10-12.5 MG tablet TAKE 1 TABLET DAILY 90 tablet 1  . Misc. Devices MISC IV 3000 infusion set cover.  Change every 3 days 30 each 3  . Multiple Vitamins-Minerals (MULTIVITAMIN,TX-MINERALS) tablet Take 1 tablet by mouth daily.      . rosuvastatin (CRESTOR) 40 MG tablet Take 1 tablet (40 mg total) by mouth daily. 90 tablet 3  . traMADol (ULTRAM) 50 MG tablet Take 1 tablet (50 mg total) by mouth every 6 (six) hours as needed. 50 tablet 5  . Transparent Dressings (TEGADERM HP) MISC 4x3.  Change every 4 days 25 each 3   No current facility-administered medications on file prior to visit.     No Known Allergies  Family History  Problem Relation Age of Onset  . Cancer Neg Hx    BP 132/80   Pulse 83   Ht 5' 11"  (1.803 m)   Wt 242 lb (109.8 kg)   SpO2 97%   BMI 33.75 kg/m  REVIEW OF SYSTEMS: Denies LOC.  He seldom has hypoglycemia.  This usually happens in the middle of the night. PHYSICAL EXAMINATION: VITAL SIGNS:  See vs page GENERAL: no distress Pulses: dorsalis pedis intact bilat.   MSK: no deformity of the feet CV: trace bilat leg edema Skin:  no ulcer on the feet.  normal color and temp on the feet.  Old healed surgical scar at the left foot. Neuro: sensation is intact to touch on the feet Ext: There is bilateral onychomycosis of the toenails.  LAB/XRAY RESULTS: Lab Results  Component Value Date   HGBA1C 7.7 03/18/2016  IMPRESSION: Type 1 DM: much improved control with minimizing  boluses PLAN:  Please take these pump settings: basal rate of 3.9 units/hr, except 4.9 units/hr, 3AM-6AM.  mealtime bolus of 1 unit with each meal.   continue correction bolus (which some people call "sensitivity," or "insulin sensitivity ratio," or just "isr") of 1 unit for each 50 by which your glucose exceeds 100.  suspend the pump for 1-2 hrs, with activity.  If not, eat a light snack with it.   On this type of insulin schedule, you should eat meals on a regular schedule.  If a meal is missed or significantly delayed, your blood sugar could go low.

## 2016-03-18 NOTE — Patient Instructions (Addendum)
Please take these pump settings: basal rate of 3.9 units/hr, except 4.9 units/hr, 3AM-6AM.  mealtime bolus of 1 unit with each meal.   continue correction bolus (which some people call "sensitivity," or "insulin sensitivity ratio," or just "isr") of 1 unit for each 50 by which your glucose exceeds 100.  suspend the pump for 1-2 hrs, with activity.  If not, eat a light snack with it.   On this type of insulin schedule, you should eat meals on a regular schedule.  If a meal is missed or significantly delayed, your blood sugar could go low.  Please feel free to upload and let us know sooner.   I'll look forward to getting the report from the hearing specialist. please let me know what your wishes would be, if artificial life support measures should become necessary.  It is critically important to prevent falling down (keep floor areas well-lit, dry, and free of loose objects.  If you have a cane, walker, or wheelchair, you should use it, even for short trips around the house.  Wear flat-soled shoes.  Also, try not to rush) It is critically important to prevent falling down (keep floor areas well-lit, dry, and free of loose objects.  If you have a cane, walker, or wheelchair, you should use it, even for short trips around the house.  Wear flat-soled shoes.  Also, try not to rush) Please call to schedule your colonoscopy. Please come back for a regular physical appointment in 4 months.

## 2016-03-18 NOTE — Progress Notes (Signed)
we discussed code status.  pt requests full code, but would not want to be started or maintained on artificial life-support measures if there was not a reasonable chance of recovery 

## 2016-03-19 LAB — HIV ANTIBODY (ROUTINE TESTING W REFLEX): HIV 1&2 Ab, 4th Generation: NONREACTIVE

## 2016-03-19 LAB — HEPATITIS C ANTIBODY: HCV Ab: NEGATIVE

## 2016-03-24 DIAGNOSIS — H918X3 Other specified hearing loss, bilateral: Secondary | ICD-10-CM | POA: Diagnosis not present

## 2016-03-24 DIAGNOSIS — R0683 Snoring: Secondary | ICD-10-CM | POA: Diagnosis not present

## 2016-03-24 DIAGNOSIS — G4733 Obstructive sleep apnea (adult) (pediatric): Secondary | ICD-10-CM | POA: Insufficient documentation

## 2016-03-25 DIAGNOSIS — H918X3 Other specified hearing loss, bilateral: Secondary | ICD-10-CM | POA: Insufficient documentation

## 2016-03-25 DIAGNOSIS — IMO0001 Reserved for inherently not codable concepts without codable children: Secondary | ICD-10-CM | POA: Insufficient documentation

## 2016-04-04 DIAGNOSIS — H903 Sensorineural hearing loss, bilateral: Secondary | ICD-10-CM | POA: Diagnosis not present

## 2016-04-07 ENCOUNTER — Other Ambulatory Visit: Payer: Self-pay | Admitting: Endocrinology

## 2016-04-28 ENCOUNTER — Telehealth: Payer: Self-pay | Admitting: Endocrinology

## 2016-04-28 DIAGNOSIS — E109 Type 1 diabetes mellitus without complications: Secondary | ICD-10-CM | POA: Diagnosis not present

## 2016-04-28 NOTE — Telephone Encounter (Signed)
[employer] is awaiting the paperwork the CGM provider form that was supposedly faxed to [place]. Has this form been received and has the paperwork been faxed back  Please return the paperwork to them today if possible and call the wife once this is done

## 2016-04-28 NOTE — Telephone Encounter (Signed)
I contacted the patient and advised of today we have not received a form for the CGM. Patient advised to contact the supplier to have the form re faxed.

## 2016-04-29 ENCOUNTER — Telehealth: Payer: Self-pay | Admitting: Endocrinology

## 2016-04-29 NOTE — Telephone Encounter (Signed)
Patient stated [employer] fax another form yesterday  Calling to see if you have received it. Please advise

## 2016-04-29 NOTE — Telephone Encounter (Signed)
I contacted the patient and left a voicemail advising we have received the forms and submitted them back to [employer].

## 2016-04-29 NOTE — Telephone Encounter (Signed)
Pt calling about [employer] form named the [employer] cgm provider form, he is asking about the status  I let him know of the message below

## 2016-05-03 ENCOUNTER — Other Ambulatory Visit: Payer: Self-pay | Admitting: Endocrinology

## 2016-05-04 DIAGNOSIS — E109 Type 1 diabetes mellitus without complications: Secondary | ICD-10-CM | POA: Diagnosis not present

## 2016-07-15 ENCOUNTER — Ambulatory Visit: Payer: BLUE CROSS/BLUE SHIELD | Admitting: Endocrinology

## 2016-08-12 ENCOUNTER — Encounter: Payer: Self-pay | Admitting: Endocrinology

## 2016-08-12 ENCOUNTER — Ambulatory Visit (INDEPENDENT_AMBULATORY_CARE_PROVIDER_SITE_OTHER): Payer: BLUE CROSS/BLUE SHIELD | Admitting: Endocrinology

## 2016-08-12 VITALS — BP 132/84 | HR 83 | Ht 71.0 in | Wt 240.0 lb

## 2016-08-12 DIAGNOSIS — E0800 Diabetes mellitus due to underlying condition with hyperosmolarity without nonketotic hyperglycemic-hyperosmolar coma (NKHHC): Secondary | ICD-10-CM | POA: Diagnosis not present

## 2016-08-12 LAB — POCT GLYCOSYLATED HEMOGLOBIN (HGB A1C): HEMOGLOBIN A1C: 7.3

## 2016-08-12 NOTE — Patient Instructions (Addendum)
Please take these pump settings: basal rate of 3.9 units/hr, except 4.9 units/hr, 3AM-6AM.  mealtime bolus of 1 unit with each meal.   correction bolus (which some people call "sensitivity," or "insulin sensitivity ratio," or just "isr") of 1 unit for each 50 by which your glucose exceeds 100.  suspend the pump for 1-2 hrs, with activity.  If not, eat a light snack with it.   On this type of insulin schedule, you should eat meals on a regular schedule.  If a meal is missed or significantly delayed, your blood sugar could go low.  Please feel free to upload and let us know sooner.   Please come back for a follow-up appointment in 4 months.

## 2016-08-12 NOTE — Progress Notes (Signed)
Subjective:        Subjective:   HPI Pt returns for f/u of diabetes mellitus: DM type: 1 Dx'ed: 5361 Complications: retinopathy.   Therapy: insulin since dx DKA: only at dx Severe hypoglycemia: most recently in 2014. 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  continuous glucose monitor ID is [email or web address] and password is [email or web address] Interval history:   He has been prescribed these pump settings:  basal rate of 3.9 units/hr (but he actually takes 4.0), except 4.9 units/hr, 3AM-6AM. mealtime bolus of 1 unit with each meal.  continue correction bolus (which some people call "sensitivity," or "insulin sensitivity ratio," or just "isr") of 1 unit for each 50 by which glucose exceeds 100.  suspend the pump for 1-2 hrs, with activity.  If not, eat a light snack with it.   He says glucose per continuous glucose monitor is much better recently.  I downloaded, and we reviewed continuous glucose monitor download info together.  He says the lows were on weekends when he was doing yard work.  Past Medical History:  Diagnosis Date  . ADENOIDECTOMY, HX OF 02/16/2007  . ANXIETY 02/16/2007  . BENIGN PROSTATIC HYPERTROPHY, WITH OBSTRUCTION 02/03/2010  . DIABETES MELLITUS, TYPE I, UNCONTROLLED 12/29/2006  . ERECTILE DYSFUNCTION 02/16/2007  . HYPERLIPIDEMIA 02/16/2007  . HYPERTENSION 02/16/2007  . TESTICULAR MASS, LEFT 02/16/2007    Past Surgical History:  Procedure Laterality Date  . TONSILLECTOMY      Social History   Social History  . Marital status: Married    Spouse name: N/A  . Number of children: N/A  . 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 Smoking status: Never Smoker  . Smokeless tobacco: Never Used  . Alcohol use Not on file  . Drug use: Unknown  . Sexual activity: Not on file    Other Topics Concern  . Not on file   Social History Narrative  . No narrative on file    Current Outpatient Prescriptions on File Prior to Visit  Medication Sig Dispense Refill  . aspirin 81 MG tablet Take 81 mg by mouth daily.      . busPIRone (BUSPAR) 30 MG tablet TAKE ONE-HALF (1/2) TABLET (15 MG) TWICE A DAY 180 tablet 1  . co-enzyme Q-10 30 MG capsule Take 30 mg by mouth daily.      . Continuous Glucose Monitor Sup (ENLITE GLUCOSE SENSOR) MISC 1 Device by Does not apply route every 3 (three) days. 30 each 3  . Cranberry 400 MG TABS Take 2 each by mouth daily.      . Cyanocobalamin (VITAMIN B-12 CR PO) Take 1 each by mouth daily.      Marland Kitchen ezetimibe (ZETIA) 10 MG tablet TAKE 1 TABLET DAILY 90 tablet 3  . finasteride (PROSCAR) 5 MG tablet Take by mouth.    . Fluticasone-Salmeterol (ADVAIR) 100-50 MCG/DOSE AEPB Inhale 1 puff into the lungs 2 (two) times daily. 1 each 3  . glucagon 1 MG injection Inject 1 mg into the vein once as needed. 1 each 12  . glucose blood (BAYER CONTOUR NEXT TEST) test strip Use to check blood sugar 5 times per day dx 250.01, 500 each 3  . HUMALOG 100 UNIT/ML injection INJECT 120 UNITS DAILY IN PUMP 100 mL 11  . Insulin Infusion Pump Supplies (  PARADIGM RESERVOIR 3ML) MISC 1 Device by Does not apply route every 3 (three) days. 30 each 3  . Insulin Infusion Pump Supplies (QUICK-SET INFUSION 43" 9MM) MISC 1 Device by Does not apply route every 3 (three) days. 30 each 3  . Lancets Misc. (ACCU-CHEK MULTICLIX LANCET DEV) KIT 1 Device by Other route 5 (five) times daily as needed. 5/day dx 250.01 450 each 3  . lisinopril-hydrochlorothiazide (PRINZIDE,ZESTORETIC) 10-12.5 MG tablet TAKE 1 TABLET DAILY 90 tablet 1  . Misc. Devices MISC IV 3000 infusion set cover.  Change every 3 days 30 each 3  . Multiple Vitamins-Minerals (MULTIVITAMIN,TX-MINERALS) tablet Take 1 tablet by mouth daily.      . rosuvastatin (CRESTOR) 40 MG tablet Take 1 tablet (40 mg total) by mouth daily.  90 tablet 3  . traMADol (ULTRAM) 50 MG tablet Take 1 tablet (50 mg total) by mouth every 6 (six) hours as needed. 50 tablet 5  . Transparent Dressings (TEGADERM HP) MISC 4x3.  Change every 4 days 25 each 3   No current facility-administered medications on file prior to visit.     No Known Allergies  Family History  Problem Relation Age of Onset  . Cancer Neg Hx     BP 132/84   Pulse 83   Ht 5' 11"  (1.803 m)   Wt 240 lb (108.9 kg)   SpO2 97%   BMI 33.47 kg/m    Review of Systems Denies LOC.    Objective:   Physical Exam PHYSICAL EXAMINATION: VITAL SIGNS:  See vs page GENERAL: no distress Pulses: dorsalis pedis intact bilat.   MSK: no deformity of the feet CV: no leg edema Skin:  no ulcer on the feet.  normal color and temp on the feet.  Neuro: sensation is intact to touch on the feet Ext: There is bilateral onychomycosis of the toes.   Lab Results  Component Value Date   HGBA1C 7.3 08/12/2016       Assessment & Plan:  Type 1 DM, with DR: minimizing boluses is working well.   Patient Instructions  Please take these pump settings: basal rate of 3.9 units/hr, except 4.9 units/hr, 3AM-6AM.  mealtime bolus of 1 unit with each meal.   correction bolus (which some people call "sensitivity," or "insulin sensitivity ratio," or just "isr") of 1 unit for each 50 by which your glucose exceeds 100.  suspend the pump for 1-2 hrs, with activity.  If not, eat a light snack with it.   On this type of insulin schedule, you should eat meals on a regular schedule.  If a meal is missed or significantly delayed, your blood sugar could go low.  Please feel free to upload and let us know sooner.   Please come back for a follow-up appointment in 4 months.

## 2016-08-14 DIAGNOSIS — G4733 Obstructive sleep apnea (adult) (pediatric): Secondary | ICD-10-CM | POA: Diagnosis not present

## 2016-08-21 ENCOUNTER — Other Ambulatory Visit: Payer: Self-pay | Admitting: Endocrinology

## 2016-09-13 DIAGNOSIS — G4733 Obstructive sleep apnea (adult) (pediatric): Secondary | ICD-10-CM | POA: Diagnosis not present

## 2016-09-30 DIAGNOSIS — E109 Type 1 diabetes mellitus without complications: Secondary | ICD-10-CM | POA: Diagnosis not present

## 2016-10-04 ENCOUNTER — Other Ambulatory Visit: Payer: Self-pay | Admitting: Endocrinology

## 2016-10-05 DIAGNOSIS — M25561 Pain in right knee: Secondary | ICD-10-CM | POA: Diagnosis not present

## 2016-10-07 DIAGNOSIS — M25561 Pain in right knee: Secondary | ICD-10-CM | POA: Diagnosis not present

## 2016-10-14 DIAGNOSIS — G4733 Obstructive sleep apnea (adult) (pediatric): Secondary | ICD-10-CM | POA: Diagnosis not present

## 2016-10-21 DIAGNOSIS — Z794 Long term (current) use of insulin: Secondary | ICD-10-CM | POA: Diagnosis not present

## 2016-10-21 DIAGNOSIS — E119 Type 2 diabetes mellitus without complications: Secondary | ICD-10-CM | POA: Diagnosis not present

## 2016-10-21 DIAGNOSIS — M23203 Derangement of unspecified medial meniscus due to old tear or injury, right knee: Secondary | ICD-10-CM | POA: Diagnosis not present

## 2016-10-21 DIAGNOSIS — M25561 Pain in right knee: Secondary | ICD-10-CM | POA: Diagnosis not present

## 2016-10-26 ENCOUNTER — Other Ambulatory Visit: Payer: Self-pay | Admitting: Endocrinology

## 2016-10-29 ENCOUNTER — Telehealth: Payer: Self-pay

## 2016-10-29 NOTE — Telephone Encounter (Signed)
Spoke with patients wife & said that she thinks he has a damaged nerve in neck.HAs trouble gripping and electric sensations in arms. Made him an appt. Tomorrow 8:15.

## 2016-10-29 NOTE — Telephone Encounter (Signed)
Called patient back because after speaking with Dr. Loanne Drilling he was concerned he may be having signs of a stroke. The patient stated that he had these symptoms now for a few weeks & that he feels these sensations after turning his neck at times. Then he gets the tingly feeling down his arms & feels like his grip weakens. I advised patient if symptoms got worse or persisted to please go to ER ASAP. If he no other issues we would see him for his appointment in the morning.

## 2016-10-30 ENCOUNTER — Ambulatory Visit (INDEPENDENT_AMBULATORY_CARE_PROVIDER_SITE_OTHER): Payer: BLUE CROSS/BLUE SHIELD | Admitting: Endocrinology

## 2016-10-30 VITALS — BP 124/82 | HR 76 | Wt 240.4 lb

## 2016-10-30 DIAGNOSIS — E0800 Diabetes mellitus due to underlying condition with hyperosmolarity without nonketotic hyperglycemic-hyperosmolar coma (NKHHC): Secondary | ICD-10-CM

## 2016-10-30 DIAGNOSIS — R29898 Other symptoms and signs involving the musculoskeletal system: Secondary | ICD-10-CM | POA: Diagnosis not present

## 2016-10-30 LAB — POCT GLYCOSYLATED HEMOGLOBIN (HGB A1C): HEMOGLOBIN A1C: 7.2

## 2016-10-30 MED ORDER — OMEPRAZOLE 40 MG PO CPDR: 40.0000 mg | DELAYED_RELEASE_CAPSULE | Freq: Every day | ORAL | 3 refills | Status: DC

## 2016-10-30 MED ORDER — NAPROXEN 375 MG PO TABS: 375.0000 mg | ORAL_TABLET | Freq: Two times a day (BID) | ORAL | 1 refills | Status: DC

## 2016-10-30 NOTE — Patient Instructions (Addendum)
Please continue these pump settings: basal rate of 3.9 units/hr, except 4.9 units/hr, 3AM-6AM.   mealtime bolus of 1 unit with each meal.   correction bolus (which some people call "sensitivity," or "insulin sensitivity ratio," or just "isr") of 1 unit for each 50 by which your glucose exceeds 100.  suspend the pump for 1-2 hrs, with activity.  If not, eat a light snack with it.   On this type of insulin schedule, you should eat meals on a regular schedule.  If a meal is missed or significantly delayed, your blood sugar could go low.  Please feel free to upload and let us know sooner.   Let's check a nerve-ending test.  you will receive a phone call, about a day and time for an appointment.  I have sent a prescription to your pharmacy, for an anti-imflamatory medication, and prilosec to protect your stomach Please come back for a follow-up appointment in 4 months.

## 2016-10-30 NOTE — Progress Notes (Signed)
Subjective:    Patient ID: Kenneth Pace, male    DOB: Jan 28, 1951, 66 y.o.   MRN: [id number]  HPI Pt returns for f/u of diabetes mellitus:  DM type: 1 Dx'ed: 6153 Complications: retinopathy.   Therapy: insulin since dx DKA: only at dx Severe hypoglycemia: most recently in 2014.  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  continuous glucose monitor ID is [email or web address] and password is [email or web address] Interval history:   He has been prescribed these pump settings:  basal rate of 3.9 units/hr, except 4.9 units/hr, 3AM-6AM. mealtime bolus of 1 unit with each meal.  continue correction bolus (which some people call "sensitivity," or "insulin sensitivity ratio," or just "isr") of 1 unit for each 50 by which glucose exceeds 100.  suspend the pump for 1-2 hrs, with activity.  If not, eat a light snack with it.   He says he seldom has hypoglycemia, and these episodes are mild.   He reports few weeks of moderate pain at both UE's, and assoc weakness. He says this started when he fell while walking his dog.  No neck pain Past Medical History:  Diagnosis Date  . ADENOIDECTOMY, HX OF 02/16/2007  . ANXIETY 02/16/2007  . BENIGN PROSTATIC HYPERTROPHY, WITH OBSTRUCTION 02/03/2010  . DIABETES MELLITUS, TYPE I, UNCONTROLLED 12/29/2006  . ERECTILE DYSFUNCTION 02/16/2007  . HYPERLIPIDEMIA 02/16/2007  . HYPERTENSION 02/16/2007  . TESTICULAR MASS, LEFT 02/16/2007    Past Surgical History:  Procedure Laterality Date  . TONSILLECTOMY      Social History   Social History  . Marital status: Married    Spouse name: N/A  . Number of children: N/A  . 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 Smoking status: Never Smoker  . Smokeless tobacco: Never Used  . Alcohol use Not on file  . Drug use: Unknown  .  Sexual activity: Not on file   Other Topics Concern  . Not on file   Social History Narrative  . No narrative on file    Current Outpatient Prescriptions on File Prior to Visit  Medication Sig Dispense Refill  . aspirin 81 MG tablet Take 81 mg by mouth daily.      . busPIRone (BUSPAR) 30 MG tablet TAKE ONE-HALF (1/2) TABLET (15 MG) TWICE A DAY 180 tablet 1  . co-enzyme Q-10 30 MG capsule Take 30 mg by mouth daily.      . Continuous Glucose Monitor Sup (ENLITE GLUCOSE SENSOR) MISC 1 Device by Does not apply route every 3 (three) days. 30 each 3  . Cranberry 400 MG TABS Take 2 each by mouth daily.      . Cyanocobalamin (VITAMIN B-12 CR PO) Take 1 each by mouth daily.      Marland Kitchen ezetimibe (ZETIA) 10 MG tablet TAKE 1 TABLET DAILY 90 tablet 3  . finasteride (PROSCAR) 5 MG tablet Take by mouth.    . Fluticasone-Salmeterol (ADVAIR) 100-50 MCG/DOSE AEPB Inhale 1 puff into the lungs 2 (two) times daily. 1 each 3  . glucagon 1 MG injection Inject 1 mg into the vein once as needed. 1 each 12  . glucose blood (BAYER CONTOUR NEXT TEST) test strip Use to check blood sugar 5 times per day dx 250.01, 500 each 3  . HUMALOG 100 UNIT/ML injection INJECT 120 UNITS DAILY  IN PUMP 100 mL 11  . Insulin Infusion Pump Supplies (PARADIGM RESERVOIR 3ML) MISC 1 Device by Does not apply route every 3 (three) days. 30 each 3  . Insulin Infusion Pump Supplies (QUICK-SET INFUSION 43" 9MM) MISC 1 Device by Does not apply route every 3 (three) days. 30 each 3  . Lancets Misc. (ACCU-CHEK MULTICLIX LANCET DEV) KIT 1 Device by Other route 5 (five) times daily as needed. 5/day dx 250.01 450 each 3  . lisinopril-hydrochlorothiazide (PRINZIDE,ZESTORETIC) 10-12.5 MG tablet TAKE 1 TABLET DAILY 90 tablet 1  . Misc. Devices MISC IV 3000 infusion set cover.  Change every 3 days 30 each 3  . Multiple Vitamins-Minerals (MULTIVITAMIN,TX-MINERALS) tablet Take 1 tablet by mouth daily.      . rosuvastatin (CRESTOR) 40 MG tablet TAKE 1 TABLET  DAILY 90 tablet 3  . traMADol (ULTRAM) 50 MG tablet Take 1 tablet (50 mg total) by mouth every 6 (six) hours as needed. 50 tablet 5  . Transparent Dressings (TEGADERM HP) MISC 4x3.  Change every 4 days 25 each 3   No current facility-administered medications on file prior to visit.     No Known Allergies  Family History  Problem Relation Age of Onset  . Cancer Neg Hx     BP 124/82   Pulse 76   Wt 240 lb 6.4 oz (109 kg)   SpO2 97%   BMI 33.53 kg/m   Review of Systems He has numbness of the arms, but no rash.      Objective:   Physical Exam VITAL SIGNS:  See vs page GENERAL: no distress. Pulses: radials are intact bilat.   MSK: no deformity of the UE's.  Strength is symmetrically reduced.  Skin:  normal color and temp on the UE's.  Neuro: sensation is intact to touch on both UE's.     Lab Results  Component Value Date   CREATININE 0.96 03/18/2016   BUN 19 03/18/2016   NA 139 03/18/2016   K 3.9 03/18/2016   CL 104 03/18/2016   CO2 29 03/18/2016    A1c=7.2%    Assessment & Plan:  Type 1 DM: well-controlled. UE weakness, new, uncertain etiology.  Patient Instructions  Please continue these pump settings: basal rate of 3.9 units/hr, except 4.9 units/hr, 3AM-6AM.   mealtime bolus of 1 unit with each meal.   correction bolus (which some people call "sensitivity," or "insulin sensitivity ratio," or just "isr") of 1 unit for each 50 by which your glucose exceeds 100.  suspend the pump for 1-2 hrs, with activity.  If not, eat a light snack with it.   On this type of insulin schedule, you should eat meals on a regular schedule.  If a meal is missed or significantly delayed, your blood sugar could go low.  Please feel free to upload and let us know sooner.   Let's check a nerve-ending test.  you will receive a phone call, about a day and time for an appointment.  I have sent a prescription to your pharmacy, for an anti-imflamatory medication, and prilosec to protect  your stomach Please come back for a follow-up appointment in 4 months.

## 2016-11-02 ENCOUNTER — Other Ambulatory Visit: Payer: Self-pay | Admitting: *Deleted

## 2016-11-02 ENCOUNTER — Encounter: Payer: Self-pay | Admitting: Neurology

## 2016-11-02 DIAGNOSIS — R29898 Other symptoms and signs involving the musculoskeletal system: Secondary | ICD-10-CM

## 2016-11-02 NOTE — Progress Notes (Unsigned)
emg 

## 2016-11-10 ENCOUNTER — Ambulatory Visit (INDEPENDENT_AMBULATORY_CARE_PROVIDER_SITE_OTHER): Payer: BLUE CROSS/BLUE SHIELD | Admitting: Neurology

## 2016-11-10 DIAGNOSIS — R29898 Other symptoms and signs involving the musculoskeletal system: Secondary | ICD-10-CM

## 2016-11-10 DIAGNOSIS — R200 Anesthesia of skin: Secondary | ICD-10-CM

## 2016-11-10 DIAGNOSIS — G6289 Other specified polyneuropathies: Secondary | ICD-10-CM

## 2016-11-10 NOTE — Procedures (Signed)
[facility]  [place], [place]  [place], [place] [address] Tel: [phone] Fax:  [phone] Test Date:  11/10/2016  Patient: Kenneth Pace DOB: Oct 30, 1950 Physician: Narda Amber, DO  Sex: Male Height: 5\' 11"  Ref Phys: Renato Shin, MD  ID#: [id number] Temp: 33.3C Technician:    Patient Complaints: This is a 66 year old gentleman referred for evaluation of subacute onset of bilateral arm paresthesias and hand weakness since early September.  NCV & EMG Findings: Extensive electrodiagnostic testing of the right upper extremity and additional studies of the left shows:  1. Right median and left ulnar sensory nerves show prolonged distal peak latency (R4.5, L3.7 ms).  Right ulnar sensory nerve showed prolonged distal peak latency (3.4 ms) and reduced amplitude (2.0 V).  Bilateral radial sensory responses are within normal limits. 2. Right ulnar motor response shows prolonged latency, reduced amplitude, and conduction velocity slowing across the elbow (A Elbow-B Elbow, R27, R30 m/s). The left ulnar motor response shows conduction velocity slowing across the elbow (A Elbow-B Elbow, L45, L45 m/s) with normal latency and amplitude.  The right ulnar motor response at the first dorsal interosseous shows mild temporal dispersion. There is no conduction block involving any of the tested muscles. 3. Right ulnar F-wave shows mildly prolonged latency (35.65 ms).   4. Chronic axonal loss changes are seen affecting bilateral abductor digit minimi and first dorsal interosseous muscles, which is worse on the right with there is also evidence of active denervation. There is no evidence of fibrillation potentials involving this low cervical paraspinal muscles.  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    With the acute onset  of his symptoms, further electrodiagnostic testing of the lower extremities and formal neurological consultation is recommended to evaluate for polyradiculoneuropathy.  ___________________________ Narda Amber, DO    Nerve Conduction Studies Anti Sensory Summary Table   Site NR Peak (ms) Norm Peak (ms) P-T Amp (V) Norm P-T Amp  Left Median Anti Sensory (2nd Digit)  33.3C  Wrist    3.8 <3.8 12.8 >10  Right Median Anti Sensory (2nd Digit)  33.3C  Wrist    4.5 <3.8 12.5 >10  Left Radial Anti Sensory (Base 1st Digit)  33.3C  Wrist    2.1 <2.8 15.9 >10  Right Radial Anti Sensory (Base 1st Digit)  33.3C  Wrist    2.6 <2.8 19.3 >10  Left Ulnar Anti Sensory (5th Digit)  33.3C  Wrist    3.7 <3.2 6.4 >5  Right Ulnar Anti Sensory (5th Digit)  33.3C  Wrist    3.4 <3.2 2.0 >5   Motor Summary Table   Site NR Onset (ms) Norm Onset (ms) O-P Amp (mV) Norm O-P Amp Site1 Site2 Delta-0 (ms) Dist (cm) Vel (m/s) Norm Vel (m/s)  Left Median Motor (Abd Poll Brev)  33.3C  Wrist    3.6 <4.0 9.2 >5 Elbow Wrist 6.0 32.0 53 >50  Elbow    9.6  8.9         Right Median Motor (Abd Poll Brev)  33.3C  Wrist    3.5 <4.0 10.4 >5 Elbow Wrist 6.5 33.0 51 >50  Elbow    10.0  9.9         Left Ulnar Motor (Abd Dig Minimi)  33.3C  Wrist    3.0 <3.1 7.5 >7 B Elbow Wrist 4.6 26.0 57 >50  B Elbow  7.6  7.5  A Elbow B Elbow 2.2 10.0 45 >50  A Elbow    9.8  6.5         Right Ulnar Motor (Abd Dig Minimi)  33.3C  Wrist    3.2 <3.1 6.6 >7 B Elbow Wrist 4.8 26.0 54 >50  B Elbow    8.0  5.9  A Elbow B Elbow 3.7 10.0 27 >50  A Elbow    11.7  5.6         Left Ulnar (FDI) Motor (1st DI)  33.3C  Wrist    4.3 <4.5 9.1 >7 B Elbow Wrist 4.8 27.0 56 >50  B Elbow    9.1  9.1  A Elbow B Elbow 2.2 10.0 45 >50  A Elbow    11.3  8.4         Right Ulnar (FDI) Motor (1st DI)  33.3C  Wrist    4.7 <4.5 3.2 >7 B Elbow Wrist 5.0 26.0 52 >50  B Elbow    9.7  3.2  A Elbow B Elbow 3.3 10.0 30 >50  A Elbow    13.0  2.7            F Wave Studies   NR F-Lat (ms) Lat Norm (ms) L-R F-Lat (ms)  Right Ulnar (Mrkrs) (Abd Dig Min)  33.3C     35.65 <33    EMG   Side Muscle Ins Act Fibs Psw Fasc Number Recrt Dur Dur. Amp Amp. Poly Poly. Comment  Right 1stDorInt Nml 1+ Nml Nml 3- Rapid Few 1+ Few 1+ Nml Nml ATR  Right ABD Dig Min Nml Nml Nml Nml 3- Rapid Some 1+ Some 1+ Nml Nml N/A  Right FlexCarpiUln Nml Nml Nml Nml Nml Nml Nml Nml Nml Nml Nml Nml N/A  Right Abd Poll Brev Nml Nml Nml Nml Nml Nml Nml Nml Nml Nml Nml Nml N/A  Right FlexPolLong Nml Nml Nml Nml Nml Nml Nml Nml Nml Nml Nml Nml N/A  Right Ext Indicis Nml Nml Nml Nml Nml Nml Nml Nml Nml Nml Nml Nml N/A  Right PronatorTeres Nml Nml Nml Nml Nml Nml Nml Nml Nml Nml Nml Nml N/A  Right Biceps Nml Nml Nml Nml Nml Nml Nml Nml Nml Nml Nml Nml N/A  Right Triceps Nml Nml Nml Nml Nml Nml Nml Nml Nml Nml Nml Nml N/A  Right Deltoid Nml Nml Nml Nml Nml Nml Nml Nml Nml Nml Nml Nml N/A  Left 1stDorInt Nml Nml Nml Nml 3- Rapid Some 1+ Some 1+ Nml Nml N/A  Left Abd Poll Brev Nml Nml Nml Nml Nml Nml Nml Nml Nml Nml Nml Nml N/A  Left FlexPolLong Nml Nml Nml Nml Nml Nml Nml Nml Nml Nml Nml Nml N/A  Left Ext Indicis Nml Nml Nml Nml Nml Nml Nml Nml Nml Nml Nml Nml N/A  Left PronatorTeres Nml Nml Nml Nml Nml Nml Nml Nml Nml Nml Nml Nml N/A  Left Biceps Nml Nml Nml Nml Nml Nml Nml Nml Nml Nml Nml Nml N/A  Left Triceps Nml Nml Nml Nml Nml Nml Nml Nml Nml Nml Nml Nml N/A  Left Deltoid Nml Nml Nml Nml Nml Nml Nml Nml Nml Nml Nml Nml N/A  Left ABD Dig Min Nml Nml Nml Nml 2- Rapid Some 1+ Some 1+ Nml Nml N/A  Left FlexCarpiUln Nml Nml Nml Nml Nml Nml Nml Nml Nml Nml Nml Nml N/A  Left Cervical Parasp Low Nml Nml Nml Nml Nml  Nml Nml Nml Nml Nml Nml Nml N/A      Waveforms:

## 2016-11-11 NOTE — Addendum Note (Signed)
Addended by: Renato Shin on: 11/11/2016 10:57 AM   Modules accepted: Orders

## 2016-11-11 NOTE — Progress Notes (Signed)
Ok.  you will receive a phone call, about a day and time for an appointment 

## 2016-11-12 DIAGNOSIS — S83241A Other tear of medial meniscus, current injury, right knee, initial encounter: Secondary | ICD-10-CM | POA: Diagnosis not present

## 2016-11-12 DIAGNOSIS — M23221 Derangement of posterior horn of medial meniscus due to old tear or injury, right knee: Secondary | ICD-10-CM | POA: Diagnosis not present

## 2016-11-12 HISTORY — PX: KNEE SURGERY: SHX244

## 2016-11-13 ENCOUNTER — Ambulatory Visit: Payer: BLUE CROSS/BLUE SHIELD | Admitting: Neurology

## 2016-11-14 DIAGNOSIS — G4733 Obstructive sleep apnea (adult) (pediatric): Secondary | ICD-10-CM | POA: Diagnosis not present

## 2016-11-19 ENCOUNTER — Encounter: Payer: BLUE CROSS/BLUE SHIELD | Admitting: Neurology

## 2016-11-19 DIAGNOSIS — M542 Cervicalgia: Secondary | ICD-10-CM | POA: Diagnosis not present

## 2016-11-20 ENCOUNTER — Ambulatory Visit (INDEPENDENT_AMBULATORY_CARE_PROVIDER_SITE_OTHER): Payer: BLUE CROSS/BLUE SHIELD | Admitting: Neurology

## 2016-11-20 ENCOUNTER — Telehealth: Payer: Self-pay | Admitting: Endocrinology

## 2016-11-20 ENCOUNTER — Encounter (HOSPITAL_COMMUNITY): Payer: Self-pay

## 2016-11-20 ENCOUNTER — Emergency Department (HOSPITAL_COMMUNITY): Payer: BLUE CROSS/BLUE SHIELD

## 2016-11-20 ENCOUNTER — Inpatient Hospital Stay (HOSPITAL_COMMUNITY)
Admission: EM | Admit: 2016-11-20 | Discharge: 2016-11-24 | DRG: 074 | Disposition: A | Discharge status: Home / Self Care | Payer: BLUE CROSS/BLUE SHIELD | Attending: Internal Medicine | Admitting: Internal Medicine

## 2016-11-20 ENCOUNTER — Observation Stay (HOSPITAL_COMMUNITY): Payer: BLUE CROSS/BLUE SHIELD

## 2016-11-20 ENCOUNTER — Encounter: Payer: Self-pay | Admitting: Neurology

## 2016-11-20 VITALS — BP 130/64 | HR 72 | Resp 16 | Ht 72.0 in | Wt 237.0 lb

## 2016-11-20 DIAGNOSIS — N400 Enlarged prostate without lower urinary tract symptoms: Secondary | ICD-10-CM | POA: Diagnosis present

## 2016-11-20 DIAGNOSIS — I10 Essential (primary) hypertension: Secondary | ICD-10-CM | POA: Diagnosis present

## 2016-11-20 DIAGNOSIS — Z794 Long term (current) use of insulin: Secondary | ICD-10-CM

## 2016-11-20 DIAGNOSIS — R296 Repeated falls: Secondary | ICD-10-CM | POA: Diagnosis present

## 2016-11-20 DIAGNOSIS — G6181 Chronic inflammatory demyelinating polyneuritis: Principal | ICD-10-CM | POA: Diagnosis present

## 2016-11-20 DIAGNOSIS — E119 Type 2 diabetes mellitus without complications: Secondary | ICD-10-CM

## 2016-11-20 DIAGNOSIS — G4733 Obstructive sleep apnea (adult) (pediatric): Secondary | ICD-10-CM | POA: Diagnosis present

## 2016-11-20 DIAGNOSIS — Z9641 Presence of insulin pump (external) (internal): Secondary | ICD-10-CM | POA: Diagnosis not present

## 2016-11-20 DIAGNOSIS — Z833 Family history of diabetes mellitus: Secondary | ICD-10-CM | POA: Diagnosis not present

## 2016-11-20 DIAGNOSIS — G610 Guillain-Barre syndrome: Secondary | ICD-10-CM | POA: Diagnosis not present

## 2016-11-20 DIAGNOSIS — G8220 Paraplegia, unspecified: Secondary | ICD-10-CM

## 2016-11-20 DIAGNOSIS — R9431 Abnormal electrocardiogram [ECG] [EKG]: Secondary | ICD-10-CM | POA: Diagnosis not present

## 2016-11-20 DIAGNOSIS — E109 Type 1 diabetes mellitus without complications: Secondary | ICD-10-CM | POA: Diagnosis present

## 2016-11-20 DIAGNOSIS — M6281 Muscle weakness (generalized): Secondary | ICD-10-CM

## 2016-11-20 DIAGNOSIS — M5126 Other intervertebral disc displacement, lumbar region: Secondary | ICD-10-CM | POA: Diagnosis not present

## 2016-11-20 DIAGNOSIS — R29898 Other symptoms and signs involving the musculoskeletal system: Secondary | ICD-10-CM

## 2016-11-20 DIAGNOSIS — M1990 Unspecified osteoarthritis, unspecified site: Secondary | ICD-10-CM | POA: Diagnosis not present

## 2016-11-20 DIAGNOSIS — R202 Paresthesia of skin: Secondary | ICD-10-CM

## 2016-11-20 DIAGNOSIS — M4802 Spinal stenosis, cervical region: Secondary | ICD-10-CM | POA: Diagnosis not present

## 2016-11-20 DIAGNOSIS — R531 Weakness: Secondary | ICD-10-CM

## 2016-11-20 DIAGNOSIS — R292 Abnormal reflex: Secondary | ICD-10-CM | POA: Diagnosis present

## 2016-11-20 DIAGNOSIS — Z8261 Family history of arthritis: Secondary | ICD-10-CM

## 2016-11-20 DIAGNOSIS — Z8249 Family history of ischemic heart disease and other diseases of the circulatory system: Secondary | ICD-10-CM

## 2016-11-20 DIAGNOSIS — G6289 Other specified polyneuropathies: Secondary | ICD-10-CM | POA: Diagnosis not present

## 2016-11-20 DIAGNOSIS — E785 Hyperlipidemia, unspecified: Secondary | ICD-10-CM | POA: Diagnosis present

## 2016-11-20 DIAGNOSIS — E0800 Diabetes mellitus due to underlying condition with hyperosmolarity without nonketotic hyperglycemic-hyperosmolar coma (NKHHC): Secondary | ICD-10-CM | POA: Diagnosis not present

## 2016-11-20 LAB — URINALYSIS, ROUTINE W REFLEX MICROSCOPIC
BACTERIA UA: NONE SEEN
BILIRUBIN URINE: NEGATIVE
Glucose, UA: >=500 mg/dL — AB
Hgb urine dipstick: NEGATIVE
KETONES UR: 5 mg/dL — AB
LEUKOCYTES UA: NEGATIVE
Nitrite: NEGATIVE
Protein, ur: NEGATIVE mg/dL
SPECIFIC GRAVITY, URINE: 1.022 (ref 1.005–1.030)
pH: 5 (ref 5.0–8.0)

## 2016-11-20 LAB — BASIC METABOLIC PANEL
Anion gap: 7 (ref 5–15)
BUN: 22 mg/dL — AB (ref 6–20)
CHLORIDE: 104 mmol/L (ref 101–111)
CO2: 26 mmol/L (ref 22–32)
CREATININE: 0.9 mg/dL (ref 0.61–1.24)
Calcium: 9.2 mg/dL (ref 8.9–10.3)
GFR calc Af Amer: >60 mL/min (ref >60)
GFR calc non Af Amer: >60 mL/min (ref >60)
GLUCOSE: 230 mg/dL — AB (ref 65–99)
POTASSIUM: 4.4 mmol/L (ref 3.5–5.1)
SODIUM: 137 mmol/L (ref 135–145)

## 2016-11-20 LAB — CBC
HEMATOCRIT: 46.8 % (ref 39.0–52.0)
Hemoglobin: 16.6 g/dL (ref 13.0–17.0)
MCH: 30.8 pg (ref 26.0–34.0)
MCHC: 35.5 g/dL (ref 30.0–36.0)
MCV: 86.8 fL (ref 78.0–100.0)
PLATELETS: 192 10*3/uL (ref 150–400)
RBC: 5.39 MIL/uL (ref 4.22–5.81)
RDW: 12.9 % (ref 11.5–15.5)
WBC: 8.3 10*3/uL (ref 4.0–10.5)

## 2016-11-20 LAB — GLUCOSE, CAPILLARY
GLUCOSE-CAPILLARY: 67 mg/dL (ref 65–99)
GLUCOSE-CAPILLARY: 90 mg/dL (ref 65–99)
GLUCOSE-CAPILLARY: 283 mg/dL — AB (ref 65–99)
Glucose-Capillary: 52 mg/dL — ABNORMAL LOW (ref 65–99)

## 2016-11-20 LAB — CSF CELL COUNT WITH DIFFERENTIAL
RBC COUNT CSF: 1 /mm3 — AB
Tube #: 1
WBC CSF: 3 /mm3 (ref 0–5)

## 2016-11-20 LAB — TSH: TSH: 1.051 u[IU]/mL (ref 0.350–4.500)

## 2016-11-20 LAB — VITAMIN B12: VITAMIN B 12: 486 pg/mL (ref 180–914)

## 2016-11-20 LAB — PROTEIN, CSF: Total  Protein, CSF: 81 mg/dL — ABNORMAL HIGH (ref 15–45)

## 2016-11-20 LAB — HEMOGLOBIN A1C
HEMOGLOBIN A1C: 7.3 % — AB (ref 4.8–5.6)
Mean Plasma Glucose: 162.81 mg/dL

## 2016-11-20 LAB — GLUCOSE, CSF: Glucose, CSF: 102 mg/dL — ABNORMAL HIGH (ref 40–70)

## 2016-11-20 MED ORDER — LISINOPRIL 10 MG PO TABS
10.0000 mg | ORAL_TABLET | Freq: Every day | ORAL | Status: DC
  Administered 2016-11-21 – 2016-11-24 (×4): 10 mg via ORAL
  Filled 2016-11-20 (×4): qty 1

## 2016-11-20 MED ORDER — ROSUVASTATIN CALCIUM 40 MG PO TABS
40.0000 mg | ORAL_TABLET | Freq: Every day | ORAL | Status: DC
  Administered 2016-11-20 – 2016-11-23 (×4): 40 mg via ORAL
  Filled 2016-11-20 (×3): qty 1
  Filled 2016-11-20: qty 2
  Filled 2016-11-20 (×2): qty 1
  Filled 2016-11-20 (×3): qty 2

## 2016-11-20 MED ORDER — IMMUNE GLOBULIN (HUMAN) 20 GM/200ML IV SOLN
400.0000 mg/kg | INTRAVENOUS | Status: DC
  Administered 2016-11-20: 40 g via INTRAVENOUS
  Filled 2016-11-20 (×2): qty 400

## 2016-11-20 MED ORDER — FINASTERIDE 5 MG PO TABS
5.0000 mg | ORAL_TABLET | Freq: Every day | ORAL | Status: DC
  Administered 2016-11-20 – 2016-11-24 (×5): 5 mg via ORAL
  Filled 2016-11-20 (×6): qty 1

## 2016-11-20 MED ORDER — LISINOPRIL-HYDROCHLOROTHIAZIDE 10-12.5 MG PO TABS: 1.0000 | ORAL_TABLET | Freq: Every day | ORAL | Status: DC

## 2016-11-20 MED ORDER — LIDOCAINE HCL (PF) 1 % IJ SOLN
INTRAMUSCULAR | Status: AC
  Filled 2016-11-20: qty 5

## 2016-11-20 MED ORDER — BUSPIRONE HCL 10 MG PO TABS
15.0000 mg | ORAL_TABLET | Freq: Two times a day (BID) | ORAL | Status: DC
  Administered 2016-11-20 – 2016-11-24 (×8): 15 mg via ORAL
  Filled 2016-11-20 (×8): qty 2

## 2016-11-20 MED ORDER — GADOBENATE DIMEGLUMINE 529 MG/ML IV SOLN
20.0000 mL | Freq: Once | INTRAVENOUS | Status: AC | PRN
  Administered 2016-11-20: 20 mL via INTRAVENOUS

## 2016-11-20 MED ORDER — TRAMADOL HCL 50 MG PO TABS: 50.0000 mg | ORAL_TABLET | Freq: Four times a day (QID) | ORAL | Status: DC | PRN

## 2016-11-20 MED ORDER — PANTOPRAZOLE SODIUM 40 MG PO TBEC: 40.0000 mg | DELAYED_RELEASE_TABLET | Freq: Every day | ORAL | Status: DC

## 2016-11-20 MED ORDER — LIDOCAINE HCL (PF) 1 % IJ SOLN
5.0000 mL | Freq: Once | INTRAMUSCULAR | Status: AC
  Administered 2016-11-20: 5 mL via INTRADERMAL

## 2016-11-20 MED ORDER — QUICK-SET INFUSION 43" 9MM MISC
1.0000 | Status: DC
Start: 2016-11-20 — End: 2016-11-20

## 2016-11-20 MED ORDER — ENLITE GLUCOSE SENSOR MISC
1.0000 | Status: DC
Start: 2016-11-20 — End: 2016-11-20

## 2016-11-20 MED ORDER — EZETIMIBE 10 MG PO TABS
10.0000 mg | ORAL_TABLET | Freq: Every day | ORAL | Status: DC
  Administered 2016-11-20 – 2016-11-24 (×5): 10 mg via ORAL
  Filled 2016-11-20 (×5): qty 1

## 2016-11-20 MED ORDER — HYDROCHLOROTHIAZIDE 12.5 MG PO CAPS
12.5000 mg | ORAL_CAPSULE | Freq: Every day | ORAL | Status: DC
  Administered 2016-11-21 – 2016-11-24 (×4): 12.5 mg via ORAL
  Filled 2016-11-20 (×4): qty 1

## 2016-11-20 MED ORDER — ASPIRIN EC 81 MG PO TBEC
81.0000 mg | DELAYED_RELEASE_TABLET | Freq: Every day | ORAL | Status: DC
  Administered 2016-11-21 – 2016-11-24 (×4): 81 mg via ORAL
  Filled 2016-11-20 (×4): qty 1

## 2016-11-20 NOTE — ED Provider Notes (Signed)
[facility] DEPT Provider Note   CSN: [id number] Arrival date & time: 11/20/16  1117     History   Chief Complaint Chief Complaint  Patient presents with  . Weakness    HPI Kenneth Pace is a 66 y.o. male who presents with extremity weakness and paresthesias. PMH significant for insulin dependent DM, HTN, HLD, OSA. He states that in late August he started to notice shooting pains down bilateral arms as well as numbness of the bilateral arms from the mid arm to hands. It progressively worsened and now he has shooting pains that wrap from the back to the abdomen bilaterally. He was initially evaluated by Dr. Posey Pronto with neurology on 9/25 and had EMG study done which revealed a demyelinating polyneuropathy. In the meantime he was having knee pain as well and had a left knee surgery. His knee pain has totally resolved after surgery but then he started to have hand and leg weakness, multiple falls, and numbness of bilateral feet. He followed up with Dr. Posey Pronto today who advised him to come to the ED urgently to r/o GBS. He also has had difficulty urinating and having a BM.  HPI  Past Medical History:  Diagnosis Date  . ADENOIDECTOMY, HX OF 02/16/2007  . ANXIETY 02/16/2007  . BENIGN PROSTATIC HYPERTROPHY, WITH OBSTRUCTION 02/03/2010  . DIABETES MELLITUS, TYPE I, UNCONTROLLED 12/29/2006  . ERECTILE DYSFUNCTION 02/16/2007  . HYPERLIPIDEMIA 02/16/2007  . HYPERTENSION 02/16/2007  . TESTICULAR MASS, LEFT 02/16/2007    Patient Active Problem List   Diagnosis Date Noted  . Weakness of both arms 10/30/2016  . Numbness 03/18/2016  . Diabetes ([place]) 04/06/2015  . Eustachian tube dysfunction 02/12/2015  . Acute maxillary sinusitis 02/12/2015  . Wellness examination 08/22/2014  . Alkaline phosphatase elevation 08/22/2014  . Screening for prostate cancer 04/24/2013  . Routine general medical examination at a health care facility 04/10/2012  . BENIGN PROSTATIC HYPERTROPHY, WITH  OBSTRUCTION 02/03/2010  . HEMOCCULT POSITIVE STOOL 05/25/2008  . ELBOW PAIN, RIGHT 07/04/2007  . Dyslipidemia 02/16/2007  . ANXIETY 02/16/2007  . ERECTILE DYSFUNCTION 02/16/2007  . Essential hypertension 02/16/2007  . TESTICULAR MASS, LEFT 02/16/2007  . KNEE PAIN, CHRONIC 02/16/2007  . ADENOIDECTOMY, HX OF 02/16/2007    Past Surgical History:  Procedure Laterality Date  . KNEE SURGERY    . TONSILLECTOMY         Home Medications    Prior to Admission medications   Medication Sig Start Date End Date Taking? Authorizing Provider  aspirin 81 MG tablet Take 81 mg by mouth daily.      [provider]  busPIRone (BUSPAR) 30 MG tablet TAKE ONE-HALF (1/2) TABLET (15 MG) TWICE A DAY 02/28/15   Renato Shin, MD  co-enzyme Q-10 30 MG capsule Take 30 mg by mouth daily.      [provider]  Continuous Glucose Monitor Sup (ENLITE GLUCOSE SENSOR) MISC 1 Device by Does not apply route every 3 (three) days. 03/18/16   Renato Shin, MD  Cranberry 400 MG TABS Take 2 each by mouth daily.      [provider]  Cyanocobalamin (VITAMIN B-12 CR PO) Take 1 each by mouth daily.      [provider]  ezetimibe (ZETIA) 10 MG tablet TAKE 1 TABLET DAILY 05/03/16   Renato Shin, MD  finasteride (PROSCAR) 5 MG tablet Take by mouth.    [provider]  Fluticasone-Salmeterol (ADVAIR) 100-50 MCG/DOSE AEPB Inhale 1 puff into the lungs 2 (two) times daily. 01/21/16  Renato Shin, MD  glucagon 1 MG injection Inject 1 mg into the vein once as needed. 07/25/13   Renato Shin, MD  glucose blood (BAYER CONTOUR NEXT TEST) test strip Use to check blood sugar 5 times per day dx 250.01, 03/18/16   Renato Shin, MD  HUMALOG 100 UNIT/ML injection INJECT 120 UNITS DAILY IN PUMP 10/26/16   Renato Shin, MD  Insulin Infusion Pump Supplies (PARADIGM RESERVOIR 3ML) MISC 1 Device by Does not apply route every 3 (three) days. 01/21/16   Renato Shin, MD  Insulin Infusion Pump Supplies  (QUICK-SET INFUSION 43" 9MM) MISC 1 Device by Does not apply route every 3 (three) days. 03/18/16   Renato Shin, MD  Lancets Misc. (ACCU-CHEK MULTICLIX LANCET DEV) KIT 1 Device by Other route 5 (five) times daily as needed. 5/day dx 250.01 01/03/15   Renato Shin, MD  lisinopril-hydrochlorothiazide (PRINZIDE,ZESTORETIC) 10-12.5 MG tablet TAKE 1 TABLET DAILY 10/04/16   Renato Shin, MD  meloxicam (MOBIC) 15 MG tablet TK 1 T PO BID FOR 7 DAYS THEN QD PRN 11/11/16   [provider]  Misc. Devices MISC IV 3000 infusion set cover.  Change every 3 days 12/08/12   Renato Shin, MD  Multiple Vitamins-Minerals (MULTIVITAMIN,TX-MINERALS) tablet Take 1 tablet by mouth daily.      [provider]  naproxen (NAPROSYN) 375 MG tablet Take 1 tablet (375 mg total) by mouth 2 (two) times daily with a meal. 10/30/16   Renato Shin, MD  omeprazole (PRILOSEC) 40 MG capsule Take 1 capsule (40 mg total) by mouth daily. 10/30/16   Renato Shin, MD  rosuvastatin (CRESTOR) 40 MG tablet TAKE 1 TABLET DAILY 08/21/16   Renato Shin, MD  traMADol (ULTRAM) 50 MG tablet Take 1 tablet (50 mg total) by mouth every 6 (six) hours as needed. 07/25/13   Renato Shin, MD  Transparent Dressings (TEGADERM HP) MISC 4x3.  Change every 4 days 01/03/15   Renato Shin, MD    Family History Family History  Problem Relation Age of Onset  . Dementia Mother   . Diabetes Mellitus I Mother   . Hypertension Father        42  . Healthy Sister   . Rheum arthritis Brother   . Cancer Neg Hx     Social History Social History  Substance Use Topics  . Smoking status: Never Smoker  . Smokeless tobacco: Never Used  . Alcohol use No     Allergies   Patient has no known allergies.   Review of Systems Review of Systems  Constitutional: Negative for fever.  HENT: Negative for trouble swallowing.   Respiratory: Negative for shortness of breath.   Cardiovascular: Negative for chest pain.  Gastrointestinal: Positive for  constipation. Negative for abdominal pain, nausea and vomiting.  Genitourinary: Positive for difficulty urinating.  Neurological: Positive for weakness and numbness. Negative for dizziness, syncope, speech difficulty and headaches.       +frequent falls  All other systems reviewed and are negative.    Physical Exam Updated Vital Signs BP 131/76   Pulse 75   Temp 98.7 F (37.1 C) (Oral)   Resp 13   Ht 6' (1.829 m)   Wt 107.5 kg (237 lb)   SpO2 98%   BMI 32.14 kg/m   Physical Exam  Constitutional: He is oriented to person, place, and time. He appears well-developed and well-nourished. No distress.  Pleasant male in NAD  HENT:  Head: Normocephalic and atraumatic.  Eyes: Pupils are equal, round, and  reactive to light. Conjunctivae are normal. Right eye exhibits no discharge. Left eye exhibits no discharge. No scleral icterus.  Neck: Normal range of motion.  Cardiovascular: Normal rate.   Pulmonary/Chest: Effort normal. No respiratory distress.  Abdominal: He exhibits no distension.  Neurological: He is alert and oriented to person, place, and time.  Lying on stretcher in NAD. GCS 15. Speaks in a clear voice. Cranial nerves II through XII grossly intact. 4/5 strength in upper extremities. Subjective numbness of bilateral arms and feet. Bilateral finger to nose intact. Gait not tested.   Skin: Skin is warm and dry.  Psychiatric: He has a normal mood and affect. His behavior is normal.  Nursing note and vitals reviewed.    ED Treatments / Results  Labs (all labs ordered are listed, but only abnormal results are displayed) Labs Reviewed  BASIC METABOLIC PANEL - Abnormal; Notable for the following:       Result Value   Glucose, Bld 230 (*)    BUN 22 (*)    All other components within normal limits  URINALYSIS, ROUTINE W REFLEX MICROSCOPIC - Abnormal; Notable for the following:    Color, Urine AMBER (*)    APPearance HAZY (*)    Glucose, UA >=500 (*)    Ketones, ur 5 (*)      Squamous Epithelial / LPF 0-5 (*)    All other components within normal limits  CBC  CBG MONITORING, ED    EKG  EKG Interpretation None       Radiology No results found.  Procedures Procedures (including critical care time)  Medications Ordered in ED Medications  lidocaine (PF) (XYLOCAINE) 1 % injection (not administered)     Initial Impression / Assessment and Plan / ED Course  I have reviewed the triage vital signs and the nursing notes.  Pertinent labs & imaging results that were available during my care of the patient were reviewed by me and considered in my medical decision making (see chart for details).  66 year old male presents with rapidly progressive weakness and paresthesias to the point where he is unable to walk and has been having multiple falls. Vitals are normal. He has noticeable weakness bilaterally on exam. Dr. Rory Percy with neurology has also seen and evaluated patient. He recommends admission for GBS r/o. Family is insistent on IR guided LP. Will also order MRI of cervical and lumbar spine with and without contrast. Spoke with Dr. Daleen Bo who will admit.  Final Clinical Impressions(s) / ED Diagnoses   Final diagnoses:  Paresthesias  Weakness of both lower extremities  Upper extremity weakness    New Prescriptions New Prescriptions   No medications on file     Recardo Evangelist, PA-C 11/20/16 [place], Nathan, MD 11/20/16 1544

## 2016-11-20 NOTE — ED Notes (Signed)
Attempted report 

## 2016-11-20 NOTE — Consult Note (Signed)
Neurology Consultation  Reason for Consult: progressive weakness of bilateral upper and lower extremities Referring Physician: Dr. Alvino Chapel in the ER, Dr. Posey Pronto at [facility] neurology  CC: weakness and falls  History is obtained from:patient, his wife and the chart  HPI: Kenneth Pace is a 66 y.o. 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 Posey Pronto upon referral from the primary care physician for evaluation of neck pain, and right hand weakness that started a few months ago. At the end of August, the patient had a fall following which she started noticing shocklike sensation going down his arm all the way to the end of his fingertips. He also had numbness. He had started having difficulty typing on the keyboard. Following this he started having difficulty walking, to the point that he was unable to drive because of inability to press on the gas pedal. He was being driven to and from work by a friend. The primary care sent him to Dr. Posey Pronto for a EMG nerve conduction study. The EMG nerve conduction study wasconcerning for subacute sensorimotor polyneuropathy which was predominantly demyelinating in type with some axonal loss affecting both the upper extremities. There were no bulbar symptoms at the time. He was asked to come back and see Dr. Posey Pronto. He had a ight knee meniscal tear, for which she had a procedure planned and scheduled. He went for the knee procedure and return to the clinic to see Dr. Posey Pronto today. In the past few days he is been having more difficulty walking, weakness in both his lower extremities, difficulty with hand grip, difficulty holding utensils, difficulty typing. He has sustained multiple falls averaging about 2-3 falls a day. Upon evaluation in clinic, Dr. 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  patient's parents do not have diabetes. His sibling has rheumatoid arthritis. Son has Crohn's. Unknown family history about other family members. Denies any preceding sickness, illness, fevers prior to this presentation. Denies any stomach infections or respiratory infections prior to this. Has traveled to [place] and [place] in the past 2 months, [place] was prior to start of any symptoms and [place] was after he had started noticing some upper extremity tingling and numbness. His wife is a neurosurgical nurse and provided history with him.  ROS: A 14 point ROS was performed and is negative except as noted in the HPI.    Past Medical History:  Diagnosis Date  . ADENOIDECTOMY, HX OF 02/16/2007  . ANXIETY 02/16/2007  . BENIGN PROSTATIC HYPERTROPHY, WITH OBSTRUCTION 02/03/2010  . DIABETES MELLITUS, TYPE I, UNCONTROLLED 12/29/2006  . ERECTILE DYSFUNCTION 02/16/2007  . HYPERLIPIDEMIA 02/16/2007  . HYPERTENSION 02/16/2007  . TESTICULAR MASS, LEFT 02/16/2007    Family History  Problem Relation Age of Onset  . Dementia Mother   . Diabetes Mellitus I Mother   . Hypertension Father        56  . Healthy Sister   . Rheum arthritis Brother   . Cancer Neg Hx     Social History:   reports that he has never smoked. He has never used smokeless tobacco. He reports that he does not drink alcohol or use drugs. Denies tobacco alcohol drug use.  Medications No current facility-administered medications for this encounter.   Current Outpatient Prescriptions:  .  aspirin 81 MG tablet, Take 81 mg by mouth daily.  , Disp: ,  Rfl:  .  busPIRone (BUSPAR) 30 MG tablet, TAKE ONE-HALF (1/2) TABLET (15 MG) TWICE A DAY, Disp: 180 tablet, Rfl: 1 .  co-enzyme Q-10 30 MG capsule, Take 30 mg by mouth daily.  , Disp: , Rfl:  .  Continuous Glucose Monitor Sup (ENLITE GLUCOSE SENSOR) MISC, 1 Device by Does not apply route every 3 (three) days., Disp: 30 each, Rfl: 3 .   Cranberry 400 MG TABS, Take 2 each by mouth daily.  , Disp: , Rfl:  .  Cyanocobalamin (VITAMIN B-12 CR PO), Take 1 each by mouth daily.  , Disp: , Rfl:  .  ezetimibe (ZETIA) 10 MG tablet, TAKE 1 TABLET DAILY, Disp: 90 tablet, Rfl: 3 .  finasteride (PROSCAR) 5 MG tablet, Take by mouth., Disp: , Rfl:  .  Fluticasone-Salmeterol (ADVAIR) 100-50 MCG/DOSE AEPB, Inhale 1 puff into the lungs 2 (two) times daily., Disp: 1 each, Rfl: 3 .  glucagon 1 MG injection, Inject 1 mg into the vein once as needed., Disp: 1 each, Rfl: 12 .  glucose blood (BAYER CONTOUR NEXT TEST) test strip, Use to check blood sugar 5 times per day dx 250.01,, Disp: 500 each, Rfl: 3 .  HUMALOG 100 UNIT/ML injection, INJECT 120 UNITS DAILY IN PUMP, Disp: 100 mL, Rfl: 11 .  Insulin Infusion Pump Supplies (PARADIGM RESERVOIR 3ML) MISC, 1 Device by Does not apply route every 3 (three) days., Disp: 30 each, Rfl: 3 .  Insulin Infusion Pump Supplies (QUICK-SET INFUSION 43" 9MM) MISC, 1 Device by Does not apply route every 3 (three) days., Disp: 30 each, Rfl: 3 .  Lancets Misc. (ACCU-CHEK MULTICLIX LANCET DEV) KIT, 1 Device by Other route 5 (five) times daily as needed. 5/day dx 250.01, Disp: 450 each, Rfl: 3 .  lisinopril-hydrochlorothiazide (PRINZIDE,ZESTORETIC) 10-12.5 MG tablet, TAKE 1 TABLET DAILY, Disp: 90 tablet, Rfl: 1 .  meloxicam (MOBIC) 15 MG tablet, TK 1 T PO BID FOR 7 DAYS THEN QD PRN, Disp: , Rfl: 0 .  Misc. Devices MISC, IV 3000 infusion set cover.  Change every 3 days, Disp: 30 each, Rfl: 3 .  Multiple Vitamins-Minerals (MULTIVITAMIN,TX-MINERALS) tablet, Take 1 tablet by mouth daily.  , Disp: , Rfl:  .  naproxen (NAPROSYN) 375 MG tablet, Take 1 tablet (375 mg total) by mouth 2 (two) times daily with a meal., Disp: 60 tablet, Rfl: 1 .  omeprazole (PRILOSEC) 40 MG capsule, Take 1 capsule (40 mg total) by mouth daily., Disp: 30 capsule, Rfl: 3 .  rosuvastatin (CRESTOR) 40 MG tablet, TAKE 1 TABLET DAILY, Disp: 90 tablet, Rfl: 3 .   traMADol (ULTRAM) 50 MG tablet, Take 1 tablet (50 mg total) by mouth every 6 (six) hours as needed., Disp: 50 tablet, Rfl: 5 .  Transparent Dressings (TEGADERM HP) MISC, 4x3.  Change every 4 days, Disp: 25 each, Rfl: 3  Exam: Current vital signs: BP 131/76   Pulse 75   Temp 98.7 F (37.1 C) (Oral)   Resp 13   Ht 6' (1.829 m)   Wt 107.5 kg (237 lb)   SpO2 98%   BMI 32.14 kg/m  Vital signs in last 24 hours: Temp:  [98.7 F (37.1 C)] 98.7 F (37.1 C) (10/05 1142) Pulse Rate:  [72-84] 75 (10/05 1300) Resp:  [13-16] 13 (10/05 1300) BP: (128-131)/(64-95) 131/76 (10/05 1300) SpO2:  [97 %-99 %] 98 % (10/05 1300) Weight:  [107.5 kg (237 lb)] 107.5 kg (237 lb) (10/05 1144)  GENERAL: Awake, alert in NAD HEENT: - Normocephalic and  atraumatic, dry mm, no LN++, no Thyromegally LUNGS - Clear to auscultation bilaterally with no wheezes CV - S1S2 RRR, no m/r/g, equal pulses bilaterally. ABDOMEN - Soft, nontender, nondistended with normoactive BS Ext: warm, well perfused, intact peripheral pulses, no edema  NEURO:  Mental Status: AA&Ox3  Language: speech is Clear.  Naming, repetition, fluency, and comprehension intact. Cranial Nerves: PERRL 60m/brisk. EOMI, visual fields full, no facial asymmetry, facial sensation intact, hearing intact, tongue/uvula/soft palate midline, normal  sternocleidomastoid and trapezius muscle strength. No evidence of tongue atrophy or fibrillations Motor:      Right  Left Shoulder Abduction  5/5  5/5 Elbow Flexion   5/5  5/5 Elbow Extension  4/5  4/5 Wrist Flexion   4+/5  4+/5 Wrist Extension  5/5  5/5 Interossei   4/5  4/5  Hip Flexion   4/5  4/5 Knee Flexion   4/5  4/5 Knee Extension  4/5  4/5 Ankle Dorsiflexion  4/5  4/55 Ankle Plantarflexion  4/5  4/5 Tone: is normal and bulk is normal Sensation- reduced vibration and light touch in a stocking pattern up to the ankles decreased sensation to pinprick and similarpatternon the lower extremity as well as in  glovelike pattern in the upper extremity. Coordination: FTN intact bilaterally, no ataxia in BLE. Gait- deferred DTRs: 2+ biceps bilaterally,2+ triceps bilaterally, 3+ knee jerks bilaterally,2+ ankle jerks, withdrawal noted on trying to elicit plantar reflexes with maybe some fanning.  Labs I have reviewed labs in epic and the results pertinent to this consultation are: CBC    Component Value Date/Time   WBC 8.3 11/20/2016 1146   RBC 5.39 11/20/2016 1146   HGB 16.6 11/20/2016 1146   HCT 46.8 11/20/2016 1146   PLT 192 11/20/2016 1146   MCV 86.8 11/20/2016 1146   MCH 30.8 11/20/2016 1146   MCHC 35.5 11/20/2016 1146   RDW 12.9 11/20/2016 1146   LYMPHSABS 1.8 03/18/2016 1515   MONOABS 0.6 03/18/2016 1515   EOSABS 0.2 03/18/2016 1515   BASOSABS 0.0 03/18/2016 1515  CMP     Component Value Date/Time   NA 137 11/20/2016 1146   K 4.4 11/20/2016 1146   CL 104 11/20/2016 1146   CO2 26 11/20/2016 1146   GLUCOSE 230 (H) 11/20/2016 1146   BUN 22 (H) 11/20/2016 1146   CREATININE 0.90 11/20/2016 1146   CALCIUM 9.2 11/20/2016 1146   PROT 6.7 03/18/2016 1515   ALBUMIN 4.3 03/18/2016 1515   AST 19 03/18/2016 1515   ALT 26 03/18/2016 1515   ALKPHOS 182 (H) 03/18/2016 1515   BILITOT 0.7 03/18/2016 1515   GFRNONAA >60 11/20/2016 1146   GFRAA >60 11/20/2016 1146  Lipid Panel     Component Value Date/Time   CHOL 119 03/18/2016 1515   TRIG 209.0 (H) 03/18/2016 1515   HDL 34.80 (L) 03/18/2016 1515   CHOLHDL 3 03/18/2016 1515   VLDL 41.8 (H) 03/18/2016 1515   LDLCALC 38 08/22/2014 0821   LDLDIRECT 66.0 03/18/2016 1515     Imaging No MRI or CT of the C-spine to review EMG nerve conduction study 11/10/2016 - from Dr. PSerita Gritnote. "NCV & EMG Findings: Extensive electrodiagnostic testing of the right upper extremity and additional studies of the left shows:  1. Right median and left ulnar sensory nerves show prolonged distal peak latency (R4.5, L3.7 ms).  Right ulnar sensory nerve  showed prolonged distal peak latency (3.4 ms) and reduced amplitude (2.0 V).  Bilateral radial sensory responses are within normal limits. 2.  Right ulnar motor response shows prolonged latency, reduced amplitude, and conduction velocity slowing across the elbow (A Elbow-B Elbow, R27, R30 m/s). The left ulnar motor response shows conduction velocity slowing across the elbow (A Elbow-B Elbow, L45, L45 m/s) with normal latency and amplitude.  The right ulnar motor response at the first dorsal interosseous shows mild temporal dispersion. There is no conduction block involving any of the tested muscles. 3. Right ulnar F-wave shows mildly prolonged latency (35.65 ms).   4. Chronic axonal loss changes are seen affecting bilateral abductor digit minimi and first dorsal interosseous muscles, which is worse on the right with there is also evidence of active denervation. There is no evidence of fibrillation potentials involving this low cervical paraspinal muscles. 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   With the acute onset of his symptoms, further electrodiagnostic testing of the lower extremities and formal neurological consultation is recommended to evaluate for polyradiculoneuropathy."  Assessment: 66 year old man with the above-mentioned past medical history with progressive paresthesias and weakness of hands and legs, in a descending pattern, where it started from his upper extremities and now involves his lower extremities, with intact reflexes and may be hyperreflexia at the knee, and nerve conduction studies suggestive of a demyelinating pattern along with secondary axonal loss in the upper extremities. Atypical for Guillain-Barr as he has intact reflexes for most part and hyperreflexic knees. 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 According to his outpatient neuromuscular neurologist, 10% or so cases of CIDP/Guillain-Barr syndrome can present with intact reflexes. Another differential to consider is cervical myelopathy.  Recommendations: IR guided LP (wife nurse in [place] unit and refuses bedside LP). Please fill tube 4 to max and will instruct lab to save sample for any future studies.  CSF to be sent for glucose, protein, cell count, Gram stainand culture. Tube number4 to be frozen for future testing. MRI C-spine w+w/o contrast MRI L-spine w+w/o contrast Serum B12, methylmalonic acid,TSH,hemoglobin A1c, SPEP, UPEP Further recommendations based on test results and clinical course. I would require admission for observation as he might require further testing as well as treatments such as IVIG.  We will follow with you.  -- Amie Portland, MD [facility] [phone]  If 7pm to 7am, please call on call as listed on AMION.

## 2016-11-20 NOTE — ED Notes (Signed)
Neuro at bedside.

## 2016-11-20 NOTE — Progress Notes (Signed)
Kenneth Pace - Initial Visit   Date: 11/20/16  Kenneth Pace MRN: [id number] DOB: 1950-03-28   Dear Dr. Loanne Drilling:   Thank you for your kind referral of Kenneth Pace for consultation of generalized paresthesias and weakness. Although his history is well known to you, please allow [place] to reiterate it for the purpose of our medical record. The patient was accompanied to the clinic by wife who also provides collateral information.     History of Present Illness: Kenneth Pace is a 66 y.o. right-handed Caucasian male with insulin-dependent diabetes mellitus, hypertension, OSA on CPAP, hyperlipidemia, and anxiety presenting for evaluation of generalized paresthesias and weakness.    Around late August, he started having electrical impulses of the arms, which started in the neck and radiates into the palm.  He also recalls having numbness of the arms.  He suffered a fall prior to this so he felt the he may have injured his neck.  A few weeks later, he also noticed weakness of the hands and unable to type at work.  In mid-September, he began having gait imbalance and started falling frequently. Since then, he has numbness/tingling of the feet and intermittent sharp pain radiating from his back into his mid-chest.  He has weakness of the both legs and has difficulty standing, climbing steps, and walking.  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 He does not have any difficulty swallowing, talking, or shortness of breath.   He initially thought his leg symptoms were stemming from his right knee pain and he was already scheduled to have R TKA on 9/27 which completed alleviated his knee pain, but his leg weakness and falls has worsened.   He denies any preceding illness, insect bites, and travel.   He also  complains of constipation and dribbling with his urine.    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    With the acute onset of his symptoms, further electrodiagnostic testing of the lower extremities and formal neurological consultation is recommended to evaluate for polyradiculoneuropathy.  Lab Results  Component Value Date   TSH 1.74 03/18/2016   Lab Results  Component Value Date   GYBWLSLH73 428 03/18/2016   Lab Results  Component Value Date   HGBA1C 7.2 10/30/2016     Past Medical History:  Diagnosis Date  . ADENOIDECTOMY, HX OF 02/16/2007  . ANXIETY 02/16/2007  . BENIGN PROSTATIC HYPERTROPHY, WITH OBSTRUCTION 02/03/2010  . DIABETES MELLITUS, TYPE I, UNCONTROLLED 12/29/2006  . ERECTILE DYSFUNCTION 02/16/2007  . HYPERLIPIDEMIA 02/16/2007  . HYPERTENSION 02/16/2007  . TESTICULAR MASS, LEFT 02/16/2007    Past Surgical History:  Procedure Laterality Date  . KNEE SURGERY    . TONSILLECTOMY       Medications:  Outpatient Encounter Prescriptions as of 11/20/2016  Medication Sig Pace  . aspirin 81 MG tablet Take 81 mg by mouth daily.     . busPIRone (BUSPAR) 30 MG tablet TAKE ONE-HALF (1/2) TABLET (15 MG) TWICE A DAY   . co-enzyme Q-10 30 MG capsule Take 30 mg by mouth daily.     . Continuous Glucose Monitor Sup (ENLITE GLUCOSE  SENSOR) MISC 1 Device by Does not apply route every 3 (three) days.   . Cranberry 400 MG TABS Take 2 each by mouth daily.     . Cyanocobalamin (VITAMIN B-12 CR PO) Take 1 each by mouth daily.     Marland Kitchen ezetimibe (ZETIA) 10 MG tablet TAKE 1 TABLET DAILY   . finasteride (PROSCAR) 5 MG tablet Take by mouth. 05/28/2014: Received from: [facility]  . Fluticasone-Salmeterol (ADVAIR) 100-50 MCG/DOSE AEPB Inhale 1 puff into the lungs 2 (two) times daily.   Marland Kitchen glucagon 1 MG injection Inject 1 mg into the vein once as needed.   Marland Kitchen glucose blood (BAYER CONTOUR NEXT TEST) test strip Use to check blood sugar 5 times per day dx 250.01,   . HUMALOG 100 UNIT/ML injection INJECT 120 UNITS DAILY IN PUMP   . Insulin Infusion Pump Supplies (PARADIGM RESERVOIR 3ML) MISC 1 Device by Does not apply route every 3 (three) days.   . Insulin Infusion Pump Supplies (QUICK-SET INFUSION 43" 9MM) MISC 1 Device by Does not apply route every 3 (three) days.   . Lancets Misc. (ACCU-CHEK MULTICLIX LANCET DEV) KIT 1 Device by Other route 5 (five) times daily as needed. 5/day dx 250.01   . lisinopril-hydrochlorothiazide (PRINZIDE,ZESTORETIC) 10-12.5 MG tablet TAKE 1 TABLET DAILY   . meloxicam (MOBIC) 15 MG tablet TK 1 T PO BID FOR 7 DAYS THEN QD PRN   . Misc. Devices MISC IV 3000 infusion set cover.  Change every 3 days   . Multiple Vitamins-Minerals (MULTIVITAMIN,TX-MINERALS) tablet Take 1 tablet by mouth daily.     . naproxen (NAPROSYN) 375 MG tablet Take 1 tablet (375 mg total) by mouth 2 (two) times daily with a meal.   . omeprazole (PRILOSEC) 40 MG capsule Take 1 capsule (40 mg total) by mouth daily.   . rosuvastatin (CRESTOR) 40 MG tablet TAKE 1 TABLET DAILY   . traMADol (ULTRAM) 50 MG tablet Take 1 tablet (50 mg total) by mouth every 6 (six) hours as needed.   . Transparent Dressings (TEGADERM HP) MISC 4x3.  Change every 4 days    No facility-administered encounter medications on file as of 11/20/2016.      Allergies: No Known Allergies  Family History: Family History  Problem Relation Age of Onset  . Cancer Neg Hx     Social History: Social History  Substance Use Topics  . Smoking status: Never Smoker  . Smokeless tobacco: Never Used  . Alcohol use Not on file   Social History   Social History Narrative  . No narrative on file     Review of Systems:  CONSTITUTIONAL: No fevers, chills, night sweats, or weight loss.   EYES: No visual changes or eye pain ENT: No hearing changes.  No history of nose bleeds.   RESPIRATORY: No cough, wheezing and shortness of breath.   CARDIOVASCULAR: Negative for chest pain, and palpitations.   GI: Negative for abdominal discomfort, blood in stools or black stools.  +recent change in bowel habits.   GU:  No history of incontinence.   MUSCLOSKELETAL: No history of joint pain or swelling.  No myalgias.   SKIN: Negative for lesions, rash, and itching.   HEMATOLOGY/ONCOLOGY: Negative for prolonged bleeding, bruising easily, and swollen nodes.  No history of cancer.   ENDOCRINE: Negative for cold or heat intolerance, polydipsia or goiter.   PSYCH:  No depression or anxiety symptoms.   NEURO: As Above.   Vital Signs:  BP 130/64  Pulse 72   Resp 16   Ht 6' (1.829 m)   Wt 237 lb (107.5 kg)   BMI 32.14 kg/m    General Medical Exam:   General:  Well appearing, comfortable.   Eyes/ENT: see cranial nerve examination.   Neck: No masses appreciated.  Full range of motion without tenderness.  No carotid bruits. Respiratory:  Clear to auscultation, good air entry bilaterally.   Cardiac:  Regular rate and rhythm, no murmur.   Extremities:  No deformities, edema, or skin discoloration.  Skin:  No rashes or lesions.  Neurological Exam: MENTAL STATUS including orientation to time, place, person, recent and remote memory, attention span and concentration, language, and fund of knowledge is normal.  Speech is not dysarthric.  CRANIAL NERVES: II:  No visual field defects.  Unremarkable fundi.   III-IV-VI: Pupils equal round and reactive to light.  Normal conjugate, extra-ocular eye movements in all directions of gaze.  No nystagmus.  No ptosis. V:  Normal facial sensation.  Jaw jerk is absent.   VII:  Normal facial symmetry and movements.  No pathologic facial reflexes.  VIII:  Normal  hearing and vestibular function.   IX-X:  Normal palatal movement.   XI:  Normal shoulder shrug and head rotation.   XII:  Normal tongue strength and range of motion, no deviation or fasciculation.  MOTOR:  Moderate right intrinsic hand muscle atrophy. No fasciculations or abnormal movements.  No pronator drift.  Tone is normal.    Right Upper Extremity:    Left Upper Extremity:    Deltoid  5/5   Deltoid  5/5   Biceps  5/5   Biceps  5/5   Triceps  5/5   Triceps  5/5   Wrist extensors  5/5   Wrist extensors  5/5   Wrist flexors  4+/5   Wrist flexors  4+/5   Finger extensors  4/5   Finger extensors  4+/5   Finger flexors  4/5   Finger flexors  4+/5   Dorsal interossei  4/5   Dorsal interossei  4+/5   Abductor pollicis  4+/5   Abductor pollicis  4+/5   Tone (Ashworth scale)  0  Tone (Ashworth scale)  0   Right Lower Extremity:    Left Lower Extremity:    Hip flexors  3/5   Hip flexors  3/5   Hip extensors  4/5   Hip extensors  4/5   Adductor  4/5  Adductor 4/5  Abductor 5-/5  Abductor 5-5/  Knee flexors  4/5   Knee flexors  4/5   Knee extensors  4/5   Knee extensors  4/5   Dorsiflexors  4/5   Dorsiflexors  4/5   Plantarflexors  4/5   Plantarflexors  4/5   Toe extensors  4/5   Toe extensors  4/5   Toe flexors  4/5   Toe flexors  4/5   Tone (Ashworth scale)  0  Tone (Ashworth scale)  0   MSRs:  Right                                                                 Left brachioradialis 2+  brachioradialis 2+  biceps 2+  biceps 2+  triceps 2+  triceps 2+  patellar 3+  patellar 3+  ankle jerk 2+  ankle jerk 2+  Hoffman no  Hoffman no  plantar response down  plantar response down  Crossed adductors and medial pectoralis reflexes are not present.  SENSORY:  Reduced vibration at the MCP, knee, and trace at the ankles and toes bilaterally, worse on the left.  Temperature and pin prick is reduced in a gradient manner distal to forearm and knees.   COORDINATION/GAIT: Mild dysmetria with  finger to nose testing.  There is slowed movement with finger tapping bilaterally due to weakness.  He is unable to rise from a chair without using arms.  He struggles to stand up and walk.  Gait very wide-based and ataxic, assisted with cane.   IMPRESSION: Mr. Unangst is a 66 year-old gentleman with rapidly progressing weakness and paresthesias of the hands and legs since mid-August.  He was referred for NCS/EMG of the arms which I performed last week and showed subacute findings of demyelinating sensorimotor neuropathy of the hands. Given these findings and my concern for Guillaine-Barre Syndrome, I requested the patient to see me urgently for consultation.  However, he was scheduled for elective right TKA and was unable to see me until today.    Since mid-August, patient started developing paresthesias of the upper extremities, followed by hand weakness, leg weakness, falls, and paresthesias of the feet.  He is now barely able to walk with a cane and falls about twice per day.  His exam showed marked weakness of the hands and lower extremities, stocking-glove distribution of sensory loss, and gait ataxia.  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  The features that are inconsistent with this diagnosis is that his reflexes are preserved and descending pattern of symptoms.  However, reflexes can be present in up to 10% of patients with GBS.   He needs urgent evaluation for acute polyradiculoneuropathy with CSF testing, MRI cervical and lumbar spine wwo contrast, and serology testing for neuropathy.  He does not have any bulbar weakness or shortness of breath.  He was recommended to go directly to the ER for admission.    The duration of this appointment visit was 60 minutes of face-to-face time with the patient.  Greater than 50% of this time was spent in  counseling, explanation of diagnosis, planning of further management, and coordination of care.   Thank you for allowing me to participate in patient's care.  If I can answer any additional questions, I would be pleased to do so.    Sincerely,    Kenneth Musto K. Posey Pronto, DO

## 2016-11-20 NOTE — H&P (Signed)
[facility] History and Physical  Kenneth Pace IOE:[id number] DOB: December 17, 1950 DOA: 11/20/2016  Referring physician:  PCP: Renato Shin, MD  Specialists:   Chief Complaint: progressive weakness   HPI: Kenneth Pace is a 66 y.o. male with PMH of IDDM, HTN, DJD, BPH presented from neurology office for possible peripheral demyelinating neuropathy, ? atypical gbs. Patient states that he developed progressive weakness, numbness in his extremities associated with recurrent falls for the last 2 weeks. He underwent emg per Dr. Posey Pronto who suspected demyelination and referred to ED for further evaluation. He reports descending progressive weakness in his extremities. No diplopia, no dysarthria, no vertigo, no back pains, no headaches, no bulbar symptoms. He denies acute trauma of injury, no recent tick bites. No fevers, no acute shortness of breath, no chest pains, no nausea, vomiting or diarrhea.  -ED: patient is stable. Neurology ordered mri, LP. hospitalist is called for admission   Review of Systems: The patient denies anorexia, fever, weight loss,, vision loss, decreased hearing, hoarseness, chest pain, syncope, dyspnea on exertion, peripheral edema, balance deficits, hemoptysis, abdominal pain, melena, hematochezia, severe indigestion/heartburn, hematuria, incontinence, genital sores, muscle weakness, suspicious skin lesions, transient blindness, difficulty walking, depression, unusual weight change, abnormal bleeding, enlarged lymph nodes, angioedema, and breast masses.    Past Medical History:  Diagnosis Date  . ADENOIDECTOMY, HX OF 02/16/2007  . ANXIETY 02/16/2007  . BENIGN PROSTATIC HYPERTROPHY, WITH OBSTRUCTION 02/03/2010  . DIABETES MELLITUS, TYPE I, UNCONTROLLED 12/29/2006  . ERECTILE DYSFUNCTION 02/16/2007  . HYPERLIPIDEMIA 02/16/2007  . HYPERTENSION 02/16/2007  . TESTICULAR MASS, LEFT 02/16/2007   Past Surgical History:  Procedure Laterality Date  . KNEE SURGERY     . TONSILLECTOMY     Social History:  reports that he has never smoked. He has never used smokeless tobacco. He reports that he does not drink alcohol or use drugs. Home;  where does patient live--home, ALF, SNF? and with whom if at home? Yes;  Can patient participate in ADLs?  No Known Allergies  Family History  Problem Relation Age of Onset  . Dementia Mother   . Diabetes Mellitus I Mother   . Hypertension Father        78  . Healthy Sister   . Rheum arthritis Brother   . Cancer Neg Hx     (be sure to complete)  Prior to Admission medications   Medication Sig Start Date End Date Taking? Authorizing Provider  aspirin 81 MG tablet Take 81 mg by mouth daily.      [provider]  busPIRone (BUSPAR) 30 MG tablet TAKE ONE-HALF (1/2) TABLET (15 MG) TWICE A DAY 02/28/15   Renato Shin, MD  co-enzyme Q-10 30 MG capsule Take 30 mg by mouth daily.      [provider]  Continuous Glucose Monitor Sup (ENLITE GLUCOSE SENSOR) MISC 1 Device by Does not apply route every 3 (three) days. 03/18/16   Renato Shin, MD  Cranberry 400 MG TABS Take 2 each by mouth daily.      [provider]  Cyanocobalamin (VITAMIN B-12 CR PO) Take 1 each by mouth daily.      [provider]  ezetimibe (ZETIA) 10 MG tablet TAKE 1 TABLET DAILY 05/03/16   Renato Shin, MD  finasteride (PROSCAR) 5 MG tablet Take by mouth.    [provider]  Fluticasone-Salmeterol (ADVAIR) 100-50 MCG/DOSE AEPB Inhale 1 puff into the lungs 2 (two) times daily. 01/21/16   Renato Shin, MD  glucagon 1 MG  injection Inject 1 mg into the vein once as needed. 07/25/13   Ellison, Sean, MD  glucose blood (BAYER CONTOUR NEXT TEST) test strip Use to check blood sugar 5 times per day dx 250.01, 03/18/16   Ellison, Sean, MD  HUMALOG 100 UNIT/ML injection INJECT 120 UNITS DAILY IN PUMP 10/26/16   Ellison, Sean, MD  Insulin Infusion Pump Supplies (PARADIGM RESERVOIR 3ML) MISC 1 Device by Does not apply route  every 3 (three) days. 01/21/16   Ellison, Sean, MD  Insulin Infusion Pump Supplies (QUICK-SET INFUSION 43" 9MM) MISC 1 Device by Does not apply route every 3 (three) days. 03/18/16   Ellison, Sean, MD  Lancets Misc. (ACCU-CHEK MULTICLIX LANCET DEV) KIT 1 Device by Other route 5 (five) times daily as needed. 5/day dx 250.01 01/03/15   Ellison, Sean, MD  lisinopril-hydrochlorothiazide (PRINZIDE,ZESTORETIC) 10-12.5 MG tablet TAKE 1 TABLET DAILY 10/04/16   Ellison, Sean, MD  meloxicam (MOBIC) 15 MG tablet TK 1 T PO BID FOR 7 DAYS THEN QD PRN 11/11/16   [provider]  Misc. Devices MISC IV 3000 infusion set cover.  Change every 3 days 12/08/12   Ellison, Sean, MD  Multiple Vitamins-Minerals (MULTIVITAMIN,TX-MINERALS) tablet Take 1 tablet by mouth daily.      [provider]  naproxen (NAPROSYN) 375 MG tablet Take 1 tablet (375 mg total) by mouth 2 (two) times daily with a meal. 10/30/16   Ellison, Sean, MD  omeprazole (PRILOSEC) 40 MG capsule Take 1 capsule (40 mg total) by mouth daily. 10/30/16   Ellison, Sean, MD  rosuvastatin (CRESTOR) 40 MG tablet TAKE 1 TABLET DAILY 08/21/16   Ellison, Sean, MD  traMADol (ULTRAM) 50 MG tablet Take 1 tablet (50 mg total) by mouth every 6 (six) hours as needed. 07/25/13   Ellison, Sean, MD  Transparent Dressings (TEGADERM HP) MISC 4x3.  Change every 4 days 01/03/15   Ellison, Sean, MD   Physical Exam: Vitals:   11/20/16 1245 11/20/16 1300  BP: 128/73 131/76  Pulse: 75 75  Resp: 15 13  Temp:    SpO2: 97% 98%     General:  Alert. No distress   Eyes: eom-I, perrla   ENT: no oral ulcers   Neck: supple, no JVD  Cardiovascular: s1,s2 rrr  Respiratory: CTA BL  Abdomen: soft, nt, nd   Skin: no rash   Musculoskeletal: no leg edema   Psychiatric: no hallucinations  Neurologic: CN 2-12 intact. Motor 5/5. DTR symmetric +4  Labs on Admission:  Basic Metabolic Panel:  Recent Labs Lab 11/20/16 1146  NA 137  K 4.4  CL 104  CO2 26   GLUCOSE 230*  BUN 22*  CREATININE 0.90  CALCIUM 9.2   Liver Function Tests: No results for input(s): AST, ALT, ALKPHOS, BILITOT, PROT, ALBUMIN in the last 168 hours. No results for input(s): LIPASE, AMYLASE in the last 168 hours. No results for input(s): AMMONIA in the last 168 hours. CBC:  Recent Labs Lab 11/20/16 1146  WBC 8.3  HGB 16.6  HCT 46.8  MCV 86.8  PLT 192   Cardiac Enzymes: No results for input(s): CKTOTAL, CKMB, CKMBINDEX, TROPONINI in the last 168 hours.  BNP (last 3 results) No results for input(s): BNP in the last 8760 hours.  ProBNP (last 3 results) No results for input(s): PROBNP in the last 8760 hours.  CBG: No results for input(s): GLUCAP in the last 168 hours.  Radiological Exams on Admission: No results found.  EKG: Independently reviewed.   Assessment/Plan Active   Problems:   Essential hypertension   Diabetes (HCC)   Weakness of both arms   66 y.o. male with PMH of IDDM, HTN, DJD, BPH presented from neurology office for possible peripheral demyelinating neuropathy, ? Atypical gbs.  Progressive muscle weakness. EMG: The electrophysiologic findings are most consistent with a subacute sensorimotorpolyneuropathy, predominantly demyelinating in type with secondary axon loss affecting the upper extremities. With the acute onset of his symptoms, further electrodiagnostic testing of the lower extremities and formal neurological consultation is recommended to evaluate for polyradiculoneuropathy -awaiting mri, LP. Neurology is following. Obtain b12, tsh, ha1c. Patient is clinically stable, monitor   IDDM. On insulin pump. Will cont while inpatient, monitor  HTN. Stable. On home regimen. Monitor     Neurology.  if consultant consulted, please document name and whether formally or informally consulted  Code Status: full (must indicate code status--if unknown or must be presumed, indicate so) Family Communication: d/w patient, his family (indicate  person spoken with, if applicable, with phone number if by telephone) Disposition Plan: home, pend clinical improvement  (indicate anticipated LOS)  Time spent: >45 minutes   BURIEV, ULUGBEK N [facility] Pager [phone]  If 7PM-7AM, please contact night-coverage [email or web address] Password [email or web address] 11/20/2016, 2:03 PM      

## 2016-11-20 NOTE — ED Notes (Signed)
Report given to 5C RN 

## 2016-11-20 NOTE — Progress Notes (Signed)
Pt arrived to unit from ED with wife at bedside. Neuro intact. Noted distress. CBG-52. Pt asymptomatic. Received a snack and orange juice. Rechecked CBG-90. Pt stable. Md notified. Remains flat for 6 hrs per verbal instruction from Md. Telemetry monitoring. SCD's in place. Will continue to monitor.

## 2016-11-20 NOTE — Progress Notes (Signed)
Same day progress note  CSF results are back-protein 81, CELF-3.-Albumin no cytologic dissociation.  MRI of the C-spine and L-spine is significant for multilevel degenerative disc disease but no myelopathy.no enhancement.  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  I have also discussed this case with his outpatient neurologist Dr. 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  I would recommend starting 5 days of IVIG-0.4 g/kg per day.  We will follow the patient with you.   No charge

## 2016-11-20 NOTE — Telephone Encounter (Signed)
Patient is being admitted for possible Guillan - barre

## 2016-11-20 NOTE — ED Notes (Signed)
Pt's insulin pump reads a CBG of 209.  Informed Cecille Rubin, Therapist, sports.

## 2016-11-20 NOTE — ED Triage Notes (Signed)
Pt. Was sent to [place] from Dr. Ena Dawley, Neurology for progressive weakness, falls and numbness and tingling from his upper extremities to his legs.  Dr. Posey Pronto reccommends admission.  Pt. Is alert and oriented "X4. Marland Kitchen  Skin is warm, pink and dry.

## 2016-11-20 NOTE — Procedures (Signed)
Diagnostic lumbar puncture performed on the left at L2-3 using a 20 gauge spinal needle.  Mild transient bleeding into the tubing was encountered during the procedure. No other complications. Full details dictated in the radiology report.

## 2016-11-20 NOTE — Patient Instructions (Signed)
With your progressive weakness, falls, and numbness/tingling, I recommend that you go to the emergency department for admission.

## 2016-11-21 DIAGNOSIS — Z794 Long term (current) use of insulin: Secondary | ICD-10-CM | POA: Diagnosis not present

## 2016-11-21 DIAGNOSIS — G4733 Obstructive sleep apnea (adult) (pediatric): Secondary | ICD-10-CM | POA: Diagnosis present

## 2016-11-21 DIAGNOSIS — E0800 Diabetes mellitus due to underlying condition with hyperosmolarity without nonketotic hyperglycemic-hyperosmolar coma (NKHHC): Secondary | ICD-10-CM

## 2016-11-21 DIAGNOSIS — G610 Guillain-Barre syndrome: Secondary | ICD-10-CM | POA: Diagnosis present

## 2016-11-21 DIAGNOSIS — M1990 Unspecified osteoarthritis, unspecified site: Secondary | ICD-10-CM | POA: Diagnosis present

## 2016-11-21 DIAGNOSIS — I10 Essential (primary) hypertension: Secondary | ICD-10-CM | POA: Diagnosis present

## 2016-11-21 DIAGNOSIS — E785 Hyperlipidemia, unspecified: Secondary | ICD-10-CM | POA: Diagnosis present

## 2016-11-21 DIAGNOSIS — R292 Abnormal reflex: Secondary | ICD-10-CM | POA: Diagnosis present

## 2016-11-21 DIAGNOSIS — Z8249 Family history of ischemic heart disease and other diseases of the circulatory system: Secondary | ICD-10-CM | POA: Diagnosis not present

## 2016-11-21 DIAGNOSIS — G6181 Chronic inflammatory demyelinating polyneuritis: Secondary | ICD-10-CM | POA: Diagnosis present

## 2016-11-21 DIAGNOSIS — R296 Repeated falls: Secondary | ICD-10-CM | POA: Diagnosis present

## 2016-11-21 DIAGNOSIS — E109 Type 1 diabetes mellitus without complications: Secondary | ICD-10-CM | POA: Diagnosis present

## 2016-11-21 DIAGNOSIS — N400 Enlarged prostate without lower urinary tract symptoms: Secondary | ICD-10-CM | POA: Diagnosis present

## 2016-11-21 DIAGNOSIS — R29898 Other symptoms and signs involving the musculoskeletal system: Secondary | ICD-10-CM | POA: Diagnosis not present

## 2016-11-21 DIAGNOSIS — Z8261 Family history of arthritis: Secondary | ICD-10-CM | POA: Diagnosis not present

## 2016-11-21 DIAGNOSIS — R202 Paresthesia of skin: Secondary | ICD-10-CM | POA: Diagnosis present

## 2016-11-21 DIAGNOSIS — Z9641 Presence of insulin pump (external) (internal): Secondary | ICD-10-CM | POA: Diagnosis present

## 2016-11-21 DIAGNOSIS — Z833 Family history of diabetes mellitus: Secondary | ICD-10-CM | POA: Diagnosis not present

## 2016-11-21 DIAGNOSIS — R531 Weakness: Secondary | ICD-10-CM | POA: Diagnosis not present

## 2016-11-21 LAB — GLUCOSE, CAPILLARY
GLUCOSE-CAPILLARY: 116 mg/dL — AB (ref 65–99)
GLUCOSE-CAPILLARY: 199 mg/dL — AB (ref 65–99)
GLUCOSE-CAPILLARY: 163 mg/dL — AB (ref 65–99)
Glucose-Capillary: 55 mg/dL — ABNORMAL LOW (ref 65–99)
Glucose-Capillary: 45 mg/dL — ABNORMAL LOW (ref 65–99)
Glucose-Capillary: 195 mg/dL — ABNORMAL HIGH (ref 65–99)

## 2016-11-21 MED ORDER — DEXTROSE 50 % IV SOLN
INTRAVENOUS | Status: AC
  Administered 2016-11-21: 25 mL
  Filled 2016-11-21: qty 50

## 2016-11-21 MED ORDER — IMMUNE GLOBULIN (HUMAN) 20 GM/200ML IV SOLN
400.0000 mg/kg | INTRAVENOUS | Status: DC
  Administered 2016-11-21 – 2016-11-22 (×2): 40 g via INTRAVENOUS
  Filled 2016-11-21 (×2): qty 400

## 2016-11-21 MED ORDER — DEXTROSE 50 % IV SOLN: 25.0000 mL | Freq: Once | INTRAVENOUS | Status: AC

## 2016-11-21 NOTE — Evaluation (Signed)
Physical Therapy Evaluation Patient Details Name: Kenneth Pace MRN: [id number] DOB: 12/25/1950 Today's Date: 11/21/2016   History of Present Illness  66 year old man with DM, HLD, HTN, OSA on CPAP, with progressive paresthesias and weakness of hands and legs, in a descending pattern, where it started from his upper extremities and now involves his lower extremities, with intact reflexes and may be hyperreflexia at the knee, and nerve conduction studies suggestive of a demyelinating pattern along with secondary axonal loss in the upper extremities. 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 Pt currently with functional limitations due to the deficits listed below (see PT Problem List). Demonstrates BIL LE weakness, Rt weaker than Lt, with Rt ankle 4 beat clonus upon rapid dorsiflexion stretch. No buckling noted of LEs today during gait training, however reports Hx of buckling and falls at home. Feels his strength has not worsened in the past few days. Recent Rt meniscal surgery.  Both static and dynamic balance difficulties. Safe with use of RW for support at this time. Wife supportive at home. Pt will benefit from skilled PT to increase their independence and safety with mobility to allow discharge to the venue listed below.      Follow Up Recommendations Supervision for mobility/OOB;Outpatient PT (Outpatient Neuro rehab)    Equipment Recommendations  Rolling walker with 5" wheels    Recommendations for Other Services       Precautions / Restrictions Precautions Precautions: Fall Restrictions Weight Bearing Restrictions: No      Mobility  Bed Mobility Overal bed mobility: Modified Independent             General bed mobility comments: requires extra time.  Transfers Overall transfer level: Needs assistance Equipment used: Rolling walker (2 wheeled) Transfers: Sit to/from  Stand Sit to Stand: Supervision         General transfer comment: Supervision for safety. slight difficulty with power-up. VC for technique.  Ambulation/Gait Ambulation/Gait assistance: Min guard Ambulation Distance (Feet): 150 Feet Assistive device: Rolling walker (2 wheeled) Gait Pattern/deviations: Step-through pattern;Decreased dorsiflexion - right;Decreased dorsiflexion - left;Decreased stride length;Steppage;Ataxic   Gait velocity interpretation: at or above normal speed for age/gender General Gait Details: Intermittent cues for foot clearance bil. Demonstrating some ataxia with wide BOS. No buckling. Short trial distance without walker demonstrated notably increased sway and LOB but able to self correct by holding onto rails in hallway. No buckling during bout pbut reports Hx of buckling at home.  Stairs Stairs: Yes Stairs assistance: Supervision Stair Management: One rail Right;One rail Left;Alternating pattern;Forwards Number of Stairs: 6 General stair comments: Practiced with rails and without rails, using post similar to home entrance. No buckling noted, VC for safety.  Wheelchair Mobility    Modified Rankin (Stroke Patients Only)       Balance Overall balance assessment: Needs assistance;History of Falls Sitting-balance support: No upper extremity supported;Feet supported Sitting balance-Leahy Scale: Normal     Standing balance support: No upper extremity supported Standing balance-Leahy Scale: Good           Rhomberg - Eyes Opened: 30 (sec) Rhomberg - Eyes Closed: 5 (sec (opens eyes, increased sway) High level balance activites: Head turns;Turns;Other (comment) High Level Balance Comments: Good weight shift with head turns. Reaches >10 inches.              Pertinent Vitals/Pain Pain Assessment: No/denies pain    Home Living Family/patient expects to be discharged to:: Private residence Living  Arrangements: Spouse/significant other Available  Help at Discharge: Family;Available 24 hours/day Type of Home: House Home Access: Stairs to enter Entrance Stairs-Rails: None Entrance Stairs-Number of Steps: 3 Home Layout: Two level;Bed/bath upstairs;Able to live on main level with bedroom/bathroom Home Equipment: Kasandra Knudsen - single point;Crutches      Prior Function Level of Independence: Independent               Hand Dominance   Dominant Hand: Right    Extremity/Trunk Assessment   Upper Extremity Assessment Upper Extremity Assessment: Defer to OT evaluation    Lower Extremity Assessment Lower Extremity Assessment: RLE deficits/detail;LLE deficits/detail RLE Deficits / Details: 4 beat clonus with Rt ankle dorsiflexion. Gross strength 4/5 except Lt knee extension 4-/5 and Lt dorsiflexion 4-/5 LLE Deficits / Details: Grossly 4+/5 strength       Communication   Communication: No difficulties  Cognition Arousal/Alertness: Awake/alert Behavior During Therapy: WFL for tasks assessed/performed Overall Cognitive Status: Within Functional Limits for tasks assessed                                        General Comments      Exercises     Assessment/Plan    PT Assessment Patient needs continued PT services  PT Problem List Decreased strength;Decreased activity tolerance;Decreased balance;Decreased mobility;Decreased coordination;Decreased knowledge of use of DME       PT Treatment Interventions DME instruction;Gait training;Stair training;Functional mobility training;Therapeutic activities;Therapeutic exercise;Balance training;Neuromuscular re-education;Patient/family education    PT Goals (Current goals can be found in the Care Plan section)  Acute Rehab PT Goals Patient Stated Goal: Get stronger PT Goal Formulation: With patient Time For Goal Achievement: 12/05/16 Potential to Achieve Goals: Good    Frequency Min 4X/week   Barriers to discharge        Co-evaluation                AM-PAC PT "6 Clicks" Daily Activity  Outcome Measure Difficulty turning over in bed (including adjusting bedclothes, sheets and blankets)?: None Difficulty moving from lying on back to sitting on the side of the bed? : A Little Difficulty sitting down on and standing up from a chair with arms (e.g., wheelchair, bedside commode, etc,.)?: A Little Help needed moving to and from a bed to chair (including a wheelchair)?: None Help needed walking in hospital room?: None Help needed climbing 3-5 steps with a railing? : None 6 Click Score: 22    End of Session Equipment Utilized During Treatment: Gait belt Activity Tolerance: Patient tolerated treatment well Patient left: in bed;with call bell/phone within reach;with bed alarm set;with SCD's reapplied Nurse Communication: Mobility status PT Visit Diagnosis: Unsteadiness on feet (R26.81);Other symptoms and signs involving the nervous system (R29.898);History of falling (Z91.81)    Time: 9371-6967 PT Time Calculation (min) (ACUTE ONLY): 20 min   Charges:   PT Evaluation $PT Eval Moderate Complexity: 1 Mod     PT G Codes:   PT G-Codes **NOT FOR INPATIENT CLASS** Functional Assessment Tool Used: AM-PAC 6 Clicks Basic Mobility;Clinical judgement Functional Limitation: Mobility: Walking and moving around Mobility: Walking and Moving Around Current Status (E9381): At least 20 percent but less than 40 percent impaired, limited or restricted Mobility: Walking and Moving Around Goal Status [phone]): At least 1 percent but less than 20 percent impaired, limited or restricted    [facility], [place]   Ellouise Newer 11/21/2016,  10:50 AM

## 2016-11-21 NOTE — Progress Notes (Signed)
PROGRESS NOTE    Kenneth Pace  CBJ:[id number] DOB: 1951/01/25 DOA: 11/20/2016 PCP: Renato Shin, MD   Chief Complaint  Patient presents with  . Weakness    Brief Narrative:  HPI on 11/20/2016 by Dr. Rowe Clack Kenneth Pace is a 66 y.o. male with PMH of IDDM, HTN, DJD, BPH presented from neurology office for possible peripheral demyelinating neuropathy, ? atypical gbs. Patient states that he developed progressive weakness, numbness in his extremities associated with recurrent falls for the last 2 weeks. He underwent emg per Dr. Posey Pronto who suspected demyelination and referred to ED for further evaluation. He reports descending progressive weakness in his extremities. No diplopia, no dysarthria, no vertigo, no back pains, no headaches, no bulbar symptoms. He denies acute trauma of injury, no recent tick bites. No fevers, no acute shortness of breath, no chest pains, no nausea, vomiting or diarrhea.  -ED: patient is stable. Neurology ordered mri, LP. 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  Patient has no complaints today. Denies chest pain, shortness of breath, abdominal pain, N/V/D/C.   Objective:   Vitals:   11/21/16 0130 11/21/16 0611 11/21/16 1040 11/21/16 1345  BP: 120/73 133/80 (!) 115/51 116/64  Pulse: 66 64 75 81  Resp:  16 18 18   Temp: 97.9 F (36.6 C) 97.8 F (36.6 C) 98 F (36.7 C) 98 F (36.7 C)  TempSrc: Oral Oral Oral Oral  SpO2: 100% 95% 96% 96%  Weight:      Height:        Intake/Output Summary (Last 24 hours) at 11/21/16 1359 Last data filed at 11/21/16 0900  Gross per 24 hour  Intake              760 ml  Output                0 ml  Net              760 ml   Filed Weights   11/20/16 1143 11/20/16 1144 11/20/16 1728  Weight: 107.5 kg (237 lb) 107.5 kg (237 lb) 103.7 kg (228 lb 9.6 oz)    Exam  General: Well developed, well nourished, NAD, appears stated age  HEENT: NCAT, mucous membranes moist.   Cardiovascular: S1 S2 auscultated, RRR, no murmurs  Respiratory: Clear to auscultation bilaterally with equal chest rise  Abdomen: Soft, obese, nontender, nondistended, + bowel sounds  Extremities: warm dry without cyanosis clubbing or edema  Neuro: AAOx3, cranial nerves-intact as tested, muscle strength- LE 4/5 B/L.  Psych: Normal affect and demeanor with intact judgement and insight, pleasant   Data Reviewed: I have  personally reviewed following labs and imaging studies  CBC:  Recent Labs Lab 11/20/16 1146  WBC 8.3  HGB 16.6  HCT 46.8  MCV 86.8  PLT 132   Basic Metabolic Panel:  Recent Labs Lab 11/20/16 1146  NA 137  K 4.4  CL 104  CO2 26  GLUCOSE 230*  BUN 22*  CREATININE 0.90  CALCIUM 9.2   GFR: Estimated Creatinine Clearance: 103.2 mL/min (by C-G formula based on SCr of 0.9 mg/dL). Liver Function Tests: No results for input(s): AST, ALT, ALKPHOS, BILITOT, PROT, ALBUMIN in the last 168 hours. No results for input(s): LIPASE, AMYLASE in the  last 168 hours. No results for input(s): AMMONIA in the last 168 hours. Coagulation Profile: No results for input(s): INR, PROTIME in the last 168 hours. Cardiac Enzymes: No results for input(s): CKTOTAL, CKMB, CKMBINDEX, TROPONINI in the last 168 hours. BNP (last 3 results) No results for input(s): PROBNP in the last 8760 hours. HbA1C:  Recent Labs  11/20/16 1857  HGBA1C 7.3*   CBG:  Recent Labs Lab 11/20/16 2152 11/21/16 0615 11/21/16 0643 11/21/16 0728 11/21/16 1121  GLUCAP 283* 55* 45* 116* 199*   Lipid Profile: No results for input(s): CHOL, HDL, LDLCALC, TRIG, CHOLHDL, LDLDIRECT in the last 72 hours. Thyroid Function Tests:  Recent Labs  11/20/16 1857  TSH 1.051   Anemia Panel:  Recent Labs  11/20/16 1857  VITAMINB12 486   Urine analysis:    Component Value Date/Time   COLORURINE AMBER (A) 11/20/2016 1202   APPEARANCEUR HAZY (A) 11/20/2016 1202   LABSPEC 1.022 11/20/2016 1202   PHURINE 5.0 11/20/2016 1202   GLUCOSEU >=500 (A) 11/20/2016 1202   GLUCOSEU NEGATIVE 03/18/2016 1515   HGBUR NEGATIVE 11/20/2016 1202   BILIRUBINUR NEGATIVE 11/20/2016 1202   KETONESUR 5 (A) 11/20/2016 1202   PROTEINUR NEGATIVE 11/20/2016 1202   UROBILINOGEN 1.0 03/18/2016 1515   NITRITE NEGATIVE 11/20/2016 1202   LEUKOCYTESUR NEGATIVE 11/20/2016 1202   Sepsis Labs: @LABRCNTIP (procalcitonin:4,lacticidven:4)  ) Recent Results (from the past 240 hour(s))  CSF culture     Status: None (Preliminary result)   Collection Time: 11/20/16  2:51 PM  Result Value Ref Range Status   Specimen Description CSF  Final   Special Requests NONE  Final   Gram Stain   Final    WBC PRESENT, PREDOMINANTLY MONONUCLEAR NO ORGANISMS SEEN CYTOSPIN SMEAR    Culture NO GROWTH < 24 HOURS  Final   Report Status PENDING  Incomplete      Radiology Studies: Mr Cervical Spine W Wo Contrast  Result Date: 11/20/2016 CLINICAL DATA:  Progressive weakness and numbness in the extremities  associated with recent falls. Question demyelination. EXAM: MRI CERVICAL AND LUMBAR SPINE WITHOUT AND WITH CONTRAST TECHNIQUE: Multiplanar and multiecho pulse sequences of the cervical spine, to include the craniocervical junction and cervicothoracic junction, and lumbar spine, were obtained without and with intravenous contrast. CONTRAST:  43mL MULTIHANCE GADOBENATE DIMEGLUMINE 529 MG/ML IV SOLN COMPARISON:  Report of MRI brain 02/11/2002. The images are not currently available. FINDINGS: MRI CERVICAL SPINE FINDINGS Alignment: AP alignment is anatomic. There straightening of the normal cervical lordosis. Vertebrae: Endplate marrow changes are present at C5-6 and C6-7 most notably. Cord: Normal signal is present in cervical and upper thoracic spinal cord to the lowest imaged level, T2-3. Minimal hyperintensity on the sagittal T2 weighted images is not corroborated on the axial images and is likely artifact. There is no focal enhancement. Posterior Fossa, vertebral arteries, paraspinal tissues: The craniocervical junction is  normal. The visualized intracranial contents are normal. Disc levels: C2-3:  Negative. C3-4: A broad-based disc protrusion is present. There is slight distortion of the ventral cord without abnormal signal. Mild foraminal narrowing bilaterally secondary to uncovertebral and facet disease. C4-5: A more profound broad-based disc osteophyte complex narrows the central canal to 7.4 mm. Moderate foraminal narrowing is worse on the right. Uncovertebral and facet disease is present. C5-6: A broad-based disc protrusion is present. There is a far left lateral component. The central canal is narrowed to 7 mm. Severe foraminal stenosis is present bilaterally. C6-7: Uncovertebral and facet disease contribute to moderate foraminal narrowing bilaterally. The central canal is patent. C7-T1:  Negative. No pathologic enhancement is present within the cervical spine. MRI LUMBAR SPINE FINDINGS Segmentation: 5 non  rib-bearing lumbar type vertebral bodies are present. Alignment:  AP alignment is anatomic. Vertebrae: Marrow signal and vertebral body heights are normal. Small hemangiomas are present at L5, S1, and S3. Conus medullaris: Extends to the L2 level and appears normal. Paraspinal and other soft tissues: Limited imaging of the abdomen is unremarkable. There is no significant adenopathy. Disc levels: Short pedicles are present throughout the lumbar spine. L1-2:  Negative. L2-3: Mild disc bulging is present without significant stenosis. L3-4: A broad-based disc bulge is present. This extends into the left neural foramen. No significant stenosis is present. L4-5: A mild broad-based disc protrusion is present. Short pedicles and facet hypertrophy contribute to mild subarticular narrowing bilaterally. Mild bilateral foraminal stenosis is evident. L5-S1: A shallow central disc protrusion is present. Facet hypertrophy contributes to mild left foraminal narrowing. The central canal is patent. IMPRESSION: 1. No focal cord signal abnormality or lesion. 2. Multilevel spondylosis of the cervical spine as described. 3. Moderate central and mild bilateral foraminal stenosis at C3-4. 4. Moderate central and bilateral foraminal narrowing at C4-5. 5. Moderate central and severe bilateral foraminal stenosis at C5-6. 6. Moderate foraminal narrowing bilaterally at C6-7 without significant central canal stenosis. 7. Short pedicles in the cervical and lumbar spine contribute to the stenosis. 8. Disc bulging at L2-3 and L3-4 without significant stenosis. 9. Disc bulging and facet hypertrophy at L4-5 with mild subarticular and foraminal stenosis bilaterally. 10. Mild left foraminal narrowing at L5-S1 secondary to asymmetric facet hypertrophy. Electronically Signed   By: San Morelle M.D.   On: 11/20/2016 17:10   Mr Lumbar Spine W Wo Contrast  Result Date: 11/20/2016 CLINICAL DATA:  Progressive weakness and numbness in the  extremities associated with recent falls. Question demyelination. EXAM: MRI CERVICAL AND LUMBAR SPINE WITHOUT AND WITH CONTRAST TECHNIQUE: Multiplanar and multiecho pulse sequences of the cervical spine, to include the craniocervical junction and cervicothoracic junction, and lumbar spine, were obtained without and with intravenous contrast. CONTRAST:  33mL MULTIHANCE GADOBENATE DIMEGLUMINE 529 MG/ML IV SOLN COMPARISON:  Report of MRI brain 02/11/2002. The images are not currently available. FINDINGS: MRI CERVICAL SPINE FINDINGS Alignment: AP alignment is anatomic. There straightening of the normal cervical lordosis. Vertebrae: Endplate marrow changes are present at C5-6 and C6-7 most notably. Cord: Normal signal is present in cervical and upper thoracic spinal cord to the lowest imaged level, T2-3. Minimal hyperintensity on the sagittal T2 weighted images is not corroborated on the axial images and is likely artifact. There is no focal enhancement. Posterior Fossa, vertebral arteries, paraspinal tissues: The craniocervical junction is normal. The visualized intracranial contents are normal. Disc levels: C2-3:  Negative. C3-4: A broad-based disc protrusion is present. There is slight distortion of the ventral cord without  abnormal signal. Mild foraminal narrowing bilaterally secondary to uncovertebral and facet disease. C4-5: A more profound broad-based disc osteophyte complex narrows the central canal to 7.4 mm. Moderate foraminal narrowing is worse on the right. Uncovertebral and facet disease is present. C5-6: A broad-based disc protrusion is present. There is a far left lateral component. The central canal is narrowed to 7 mm. Severe foraminal stenosis is present bilaterally. C6-7: Uncovertebral and facet disease contribute to moderate foraminal narrowing bilaterally. The central canal is patent. C7-T1:  Negative. No pathologic enhancement is present within the cervical spine. MRI LUMBAR SPINE FINDINGS  Segmentation: 5 non rib-bearing lumbar type vertebral bodies are present. Alignment:  AP alignment is anatomic. Vertebrae: Marrow signal and vertebral body heights are normal. Small hemangiomas are present at L5, S1, and S3. Conus medullaris: Extends to the L2 level and appears normal. Paraspinal and other soft tissues: Limited imaging of the abdomen is unremarkable. There is no significant adenopathy. Disc levels: Short pedicles are present throughout the lumbar spine. L1-2:  Negative. L2-3: Mild disc bulging is present without significant stenosis. L3-4: A broad-based disc bulge is present. This extends into the left neural foramen. No significant stenosis is present. L4-5: A mild broad-based disc protrusion is present. Short pedicles and facet hypertrophy contribute to mild subarticular narrowing bilaterally. Mild bilateral foraminal stenosis is evident. L5-S1: A shallow central disc protrusion is present. Facet hypertrophy contributes to mild left foraminal narrowing. The central canal is patent. IMPRESSION: 1. No focal cord signal abnormality or lesion. 2. Multilevel spondylosis of the cervical spine as described. 3. Moderate central and mild bilateral foraminal stenosis at C3-4. 4. Moderate central and bilateral foraminal narrowing at C4-5. 5. Moderate central and severe bilateral foraminal stenosis at C5-6. 6. Moderate foraminal narrowing bilaterally at C6-7 without significant central canal stenosis. 7. Short pedicles in the cervical and lumbar spine contribute to the stenosis. 8. Disc bulging at L2-3 and L3-4 without significant stenosis. 9. Disc bulging and facet hypertrophy at L4-5 with mild subarticular and foraminal stenosis bilaterally. 10. Mild left foraminal narrowing at L5-S1 secondary to asymmetric facet hypertrophy. Electronically Signed   By: San Morelle M.D.   On: 11/20/2016 17:10   Dg Lumbar Puncture Fluoro Guide  Result Date: 11/20/2016 CLINICAL DATA:  Progressive extremity  weakness. 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 EXAM: DIAGNOSTIC LUMBAR PUNCTURE UNDER FLUOROSCOPIC GUIDANCE FLUOROSCOPY TIME:  Fluoroscopy Time: 6 seconds of intermittent low dose pulsed fluoro. Radiation Exposure Index (if provided by the fluoroscopic device): 9.8 mGy Number of Acquired Spot Images: 0 PROCEDURE: Informed consent was obtained from the patient prior to the procedure, including potential complications of headache, allergy, and pain. With the patient prone, the lower back was prepped with Betadine after localizing an appropriate needle position site. 1% Lidocaine was used for local anesthesia. Lumbar puncture was performed at the L2-3 level on the left using a 20 gauge needle with return of clear, colorless CSF with an opening pressure of 14.5 cm water measured prone through the needle. After collection of a few ml of clear fluid, the CSF returned decreased. The needle was repositioned in an attempt to improve the CSF flow. This resulted in some bleeding into the needle and tubing. After additional repositioning, the CSF again cleared. 12 ml of CSF were obtained for laboratory studies. The patient tolerated the procedure well and there were no apparent complications. IMPRESSION: Lumbar puncture performed for CSF analysis. Normal opening pressure. Electronically Signed   By: Richardean Sale M.D.   On: 11/20/2016 15:16  Scheduled Meds: . aspirin EC  81 mg Oral Daily  . busPIRone  15 mg Oral BID  . ezetimibe  10 mg Oral Daily  . finasteride  5 mg Oral Daily  . lisinopril  10 mg Oral Daily   And  . hydrochlorothiazide  12.5 mg Oral Daily  . rosuvastatin  40 mg Oral q1800   Continuous Infusions: . Immune Globulin 10% 40 g (11/20/16 2211)     LOS: 0 days   Time Spent in minutes   30 minutes  Jaleen Grupp D.O. on 11/21/2016 at 1:59 PM  Between 7am to 7pm - Pager - [phone]  After 7pm go to [email or web address] - password [email or web address]  And look for the night coverage person covering for me  after hours  [facility] Office  [phone]

## 2016-11-21 NOTE — Progress Notes (Addendum)
Neurology Progress Note   S:// No acute events overnight. Received D1 of IVIG overnight. No adverse reactions. Unchanged complaints.  O:// Current vital signs: BP 133/80 (BP Location: Right Arm)   Pulse 64   Temp 97.8 F (36.6 C) (Oral)   Resp 16   Ht 6' (1.829 m)   Wt 103.7 kg (228 lb 9.6 oz)   SpO2 95%   BMI 31.00 kg/m  Vital signs in last 24 hours: Temp:  [97.6 F (36.4 C)-98.7 F (37.1 C)] 97.8 F (36.6 C) (10/06 3536) Pulse Rate:  [64-84] 64 (10/06 0611) Resp:  [13-20] 16 (10/06 0611) BP: (116-133)/(57-95) 133/80 (10/06 0611) SpO2:  [95 %-100 %] 95 % (10/06 0611) Weight:  [103.7 kg (228 lb 9.6 oz)-107.5 kg (237 lb)] 103.7 kg (228 lb 9.6 oz) (10/05 1728)  GENERAL: Awake, alert in NAD HEENT: - Normocephalic and atraumatic, dry mm, no LN++, no Thyromegally LUNGS - Clear to auscultation bilaterally with no wheezes CV - S1S2 RRR, no m/r/g, equal pulses bilaterally. ABDOMEN - Soft, nontender, nondistended with normoactive BS Ext: warm, well perfused, intact peripheral pulses, no edema  NEURO:  Essentially unchanged from yesterday Mental Status: AA&Ox3  Language: speech is Clear.  Naming, repetition, fluency, and comprehension intact. Cranial Nerves: PERRL 32mm/brisk. EOMI, visual fields full, no facial asymmetry, facial sensation intact, hearing intact, tongue/uvula/soft palate midline, normal  sternocleidomastoid and trapezius muscle strength. No evidence of tongue atrophy or fibrillations Motor:                                                  Right                Left Shoulder Abduction                5/5                   5/5 Elbow Flexion                          5/5                   5/5 Elbow Extension                     4/5                   4/5 Wrist Flexion                           4+/5                 4+/5 Wrist Extension                       5/5                   5/5 Interossei                                4/5                   4/5  Hip Flexion  4/5                   4/5 Knee Flexion                           4/5                   4/5 Knee Extension                       4/5                   4/5 Ankle Dorsiflexion                   4/5                   4/55 Ankle Plantarflexion                4/5                   4/5 Tone: is normal and bulk is normal Sensation- reduced vibration and light touch in a stocking pattern up to the ankles decreased sensation to pinprick and similarpatternon the lower extremity as well as in glovelike pattern in the upper extremity. Coordination: FTN intact bilaterally, no ataxia in BLE. Gait- deferred DTRs: 2+ biceps bilaterally,2+ triceps bilaterally, 3+ knee jerks bilaterally,2+ ankle jerks, withdrawal noted on trying to elicit plantar reflexes with maybe some fanning.  MEDS  Current Facility-Administered Medications:  .  aspirin EC tablet 81 mg, 81 mg, Oral, Daily, Buriev, Arie Sabina, MD .  busPIRone (BUSPAR) tablet 15 mg, 15 mg, Oral, BID, Buriev, Arie Sabina, MD, 15 mg at 11/20/16 2120 .  ezetimibe (ZETIA) tablet 10 mg, 10 mg, Oral, Daily, Buriev, Arie Sabina, MD, 10 mg at 11/20/16 2125 .  finasteride (PROSCAR) tablet 5 mg, 5 mg, Oral, Daily, Buriev, Arie Sabina, MD, 5 mg at 11/20/16 2124 .  lisinopril (PRINIVIL,ZESTRIL) tablet 10 mg, 10 mg, Oral, Daily **AND** hydrochlorothiazide (MICROZIDE) capsule 12.5 mg, 12.5 mg, Oral, Daily, Buriev, Arie Sabina, MD .  Immune Globulin 10% (PRIVIGEN) IV infusion 40 g, 400 mg/kg, Intravenous, Q24 Hr x 5, Amie Portland, MD, Last Rate: 104 mL/hr at 11/20/16 2211, 40 g at 11/20/16 2332 .  rosuvastatin (CRESTOR) tablet 40 mg, 40 mg, Oral, q1800, Kinnie Feil, MD, 40 mg at 11/20/16 2125  Labs CBC    Component Value Date/Time   WBC 8.3 11/20/2016 1146   RBC 5.39 11/20/2016 1146   HGB 16.6 11/20/2016 1146   HCT 46.8 11/20/2016 1146   PLT 192 11/20/2016 1146   MCV 86.8 11/20/2016 1146   MCH 30.8 11/20/2016 1146   MCHC 35.5 11/20/2016 1146    RDW 12.9 11/20/2016 1146   LYMPHSABS 1.8 03/18/2016 1515   MONOABS 0.6 03/18/2016 1515   EOSABS 0.2 03/18/2016 1515   BASOSABS 0.0 03/18/2016 1515    CMP     Component Value Date/Time   NA 137 11/20/2016 1146   K 4.4 11/20/2016 1146   CL 104 11/20/2016 1146   CO2 26 11/20/2016 1146   GLUCOSE 230 (H) 11/20/2016 1146   BUN 22 (H) 11/20/2016 1146   CREATININE 0.90 11/20/2016 1146   CALCIUM 9.2 11/20/2016 1146   PROT 6.7 03/18/2016 1515   ALBUMIN 4.3 03/18/2016 1515   AST 19 03/18/2016 1515   ALT 26 03/18/2016 1515   ALKPHOS 182 (H)  03/18/2016 1515   BILITOT 0.7 03/18/2016 1515   GFRNONAA >60 11/20/2016 1146   GFRAA >60 11/20/2016 1146    glycosylated hemoglobin  Lipid Panel     Component Value Date/Time   CHOL 119 03/18/2016 1515   TRIG 209.0 (H) 03/18/2016 1515   HDL 34.80 (L) 03/18/2016 1515   CHOLHDL 3 03/18/2016 1515   VLDL 41.8 (H) 03/18/2016 1515   LDLCALC 38 08/22/2014 0821   LDLDIRECT 66.0 03/18/2016 1515  b12 - 486 A1c 7.3 TSH 1.05  I reviewed imaging - C spine and L spine MRI shows multilevel DJD but no myelopathy.  Assessment:  66 year old man with DM, HLD, HTN, OSA on CPAP, with progressive paresthesias and weakness of hands and legs, in a descending pattern, where it started from his upper extremities and now involves his lower extremities, with intact reflexes and may be hyperreflexia at the knee, and nerve conduction studies suggestive of a demyelinating pattern along with secondary axonal loss in the upper extremities. Atypical for Guillain-Barr as he has intact reflexes for most part and hyperreflexic knees. 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 because of the EMG nerve conduction study findings.  LP showed protein 81, 3 cells (albuminocytological dissociation) suggestive of inflammatory demyelinating process such as AIDP/CIDP or GBS variant.  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  -- Amie Portland, MD [facility] [phone]  If 7pm to 7am, please call on call as listed on AMION.

## 2016-11-21 NOTE — Progress Notes (Signed)
Hypoglycemic Event  CBG:55  Treatment: 15 GM carbohydrate snack  Symptoms: None  Follow-up CBG: Time 0643 CBG Result:45  Possible Reasons for Event: Unknown  Comments/MD notified:n/a, hypoglycemic protocol initiated    [name]

## 2016-11-22 LAB — CBC
HEMATOCRIT: 43.1 % (ref 39.0–52.0)
HEMOGLOBIN: 15 g/dL (ref 13.0–17.0)
MCH: 30.4 pg (ref 26.0–34.0)
MCHC: 34.8 g/dL (ref 30.0–36.0)
MCV: 87.4 fL (ref 78.0–100.0)
Platelets: 154 10*3/uL (ref 150–400)
RBC: 4.93 MIL/uL (ref 4.22–5.81)
RDW: 12.8 % (ref 11.5–15.5)
WBC: 6.5 10*3/uL (ref 4.0–10.5)

## 2016-11-22 LAB — BASIC METABOLIC PANEL
ANION GAP: 8 (ref 5–15)
BUN: 18 mg/dL (ref 6–20)
CHLORIDE: 106 mmol/L (ref 101–111)
CO2: 23 mmol/L (ref 22–32)
CREATININE: 0.82 mg/dL (ref 0.61–1.24)
Calcium: 8.7 mg/dL — ABNORMAL LOW (ref 8.9–10.3)
GFR calc non Af Amer: >60 mL/min (ref >60)
Glucose, Bld: 85 mg/dL (ref 65–99)
POTASSIUM: 3.6 mmol/L (ref 3.5–5.1)
Sodium: 137 mmol/L (ref 135–145)

## 2016-11-22 LAB — GLUCOSE, CAPILLARY
GLUCOSE-CAPILLARY: 172 mg/dL — AB (ref 65–99)
Glucose-Capillary: 74 mg/dL (ref 65–99)
Glucose-Capillary: 230 mg/dL — ABNORMAL HIGH (ref 65–99)
Glucose-Capillary: 170 mg/dL — ABNORMAL HIGH (ref 65–99)

## 2016-11-22 MED ORDER — ACETAMINOPHEN 325 MG PO TABS
650.0000 mg | ORAL_TABLET | Freq: Four times a day (QID) | ORAL | Status: DC | PRN
  Administered 2016-11-22 – 2016-11-24 (×6): 650 mg via ORAL
  Filled 2016-11-22 (×7): qty 2

## 2016-11-22 NOTE — Progress Notes (Signed)
PROGRESS NOTE    Kenneth FAUCETT  HYW:[id number] DOB: 04/28/1950 DOA: 11/20/2016 PCP: Renato Shin, MD   Chief Complaint  Patient presents with  . Weakness    Brief Narrative:  HPI on 11/20/2016 by Dr. Rowe Clack Kenneth Pace is a 66 y.o. male with PMH of IDDM, HTN, DJD, BPH presented from neurology office for possible peripheral demyelinating neuropathy, ? atypical gbs. Patient states that he developed progressive weakness, numbness in his extremities associated with recurrent falls for the last 2 weeks. He underwent emg per Dr. Posey Pronto who suspected demyelination and referred to ED for further evaluation. He reports descending progressive weakness in his extremities. No diplopia, no dysarthria, no vertigo, no back pains, no headaches, no bulbar symptoms. He denies acute trauma of injury, no recent tick bites. No fevers, no acute shortness of breath, no chest pains, no nausea, vomiting or diarrhea.  -ED: patient is stable. Neurology ordered mri, LP. 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 Pending completion of IVIG  Consultants Neurology  Procedures  Lumbar puncture  Antibiotics   Anti-infectives      None      Subjective:   Kenneth Pace seen and examined today.  Patient feeling better today. Denies chest pain, shortness breath, abdominal pain, nausea vomiting, diarrhea constipation. Feels his strength has improved.  Objective:   Vitals:   11/22/16 0629 11/22/16 0818 11/22/16 0900 11/22/16 1300  BP: (!) 145/81 140/78 135/75 125/68  Pulse: 62 61 77 71  Resp: 20 16 17 16   Temp: 97.8 F (36.6 C) 99.4 F (37.4 C) 98.1 F (36.7 C) 97.9 F (36.6 C)  TempSrc: Oral Oral Oral Oral  SpO2: 100%  100% 98%  Weight:      Height:       No intake or output data in the 24 hours ending 11/22/16 1519 Filed Weights   11/20/16 1143 11/20/16 1144 11/20/16 1728  Weight: 107.5 kg (237 lb) 107.5 kg (237 lb) 103.7 kg (228 lb 9.6 oz)   Exam  General: Well developed, well nourished, NAD, appears stated age  HEENT: NCAT, mucous membranes moist.   Cardiovascular: S1 S2 auscultated, RRR, no murmurs  Respiratory: Clear to auscultation bilaterally with equal chest rise  Abdomen: Soft, nontender, nondistended, + bowel sounds  Extremities: warm dry without cyanosis clubbing or edema  Neuro: AAOx3, nonfocal, grip strength decreased  Psych: Normal affect and demeanor, pleasant   Data Reviewed: I have personally reviewed following labs and imaging studies  CBC:  Recent Labs Lab 11/20/16 1146 11/22/16 0551  WBC 8.3 6.5  HGB 16.6 15.0  HCT 46.8 43.1  MCV 86.8 87.4  PLT 192 485   Basic Metabolic Panel:  Recent Labs Lab 11/20/16 1146 11/22/16 0551  NA 137 137  K 4.4 3.6  CL 104 106  CO2 26 23  GLUCOSE 230* 85  BUN 22* 18  CREATININE 0.90 0.82  CALCIUM 9.2 8.7*   GFR: Estimated Creatinine Clearance: 113.3 mL/min (by C-G formula based on SCr of 0.82 mg/dL). Liver Function Tests: No results for input(s): AST, ALT, ALKPHOS, BILITOT, PROT, ALBUMIN in the last 168 hours. No results for input(s): LIPASE, AMYLASE in the last 168 hours. No results for input(s): AMMONIA in  the last 168 hours. Coagulation Profile: No results for input(s): INR, PROTIME in the last 168 hours. Cardiac Enzymes: No results for input(s): CKTOTAL, CKMB, CKMBINDEX, TROPONINI in the last 168 hours. BNP (last 3 results) No results for input(s): PROBNP in the last 8760 hours. HbA1C:  Recent Labs  11/20/16 1857  HGBA1C 7.3*   CBG:  Recent Labs Lab 11/21/16 1121 11/21/16 1626 11/21/16 2049 11/22/16 0626 11/22/16 1122  GLUCAP 199* 195* 163* 74 172*   Lipid Profile: No results for input(s): CHOL, HDL, LDLCALC, TRIG, CHOLHDL, LDLDIRECT in the last 72 hours. Thyroid Function Tests:  Recent Labs  11/20/16 1857  TSH 1.051   Anemia Panel:  Recent Labs  11/20/16 1857  VITAMINB12 486   Urine analysis:    Component Value Date/Time   COLORURINE AMBER (A) 11/20/2016 1202   APPEARANCEUR HAZY (A) 11/20/2016 1202   LABSPEC 1.022 11/20/2016 1202   PHURINE 5.0 11/20/2016 1202   GLUCOSEU >=500 (A) 11/20/2016 1202   GLUCOSEU NEGATIVE 03/18/2016 1515   HGBUR NEGATIVE 11/20/2016 1202   BILIRUBINUR NEGATIVE 11/20/2016 1202   KETONESUR 5 (A) 11/20/2016 1202   PROTEINUR NEGATIVE 11/20/2016 1202   UROBILINOGEN 1.0 03/18/2016 1515   NITRITE NEGATIVE 11/20/2016 1202   LEUKOCYTESUR NEGATIVE 11/20/2016 1202   Sepsis Labs: @LABRCNTIP (procalcitonin:4,lacticidven:4)  ) Recent Results (from the past 240 hour(s))  Anaerobic culture     Status: None (Preliminary result)   Collection Time: 11/20/16  2:51 PM  Result Value Ref Range Status   Specimen Description CSF  Final   Special Requests NONE  Final   Culture   Final    NO ANAEROBES ISOLATED; CULTURE IN PROGRESS FOR 5 DAYS   Report Status PENDING  Incomplete  CSF culture     Status: None (Preliminary result)   Collection Time: 11/20/16  2:51 PM  Result Value Ref Range Status   Specimen Description CSF  Final   Special Requests NONE  Final   Gram Stain   Final    WBC PRESENT, PREDOMINANTLY MONONUCLEAR NO ORGANISMS  SEEN CYTOSPIN SMEAR    Culture NO GROWTH 2 DAYS  Final   Report Status PENDING  Incomplete      Radiology Studies: Mr Cervical Spine W Wo Contrast  Result Date: 11/20/2016 CLINICAL DATA:  Progressive weakness and numbness in the extremities associated with recent falls. Question demyelination. EXAM: MRI CERVICAL AND LUMBAR SPINE WITHOUT AND WITH CONTRAST TECHNIQUE: Multiplanar and multiecho pulse sequences of the cervical spine, to include the craniocervical junction and cervicothoracic junction, and lumbar spine, were obtained without and with intravenous contrast. CONTRAST:  24mL MULTIHANCE GADOBENATE DIMEGLUMINE 529 MG/ML IV SOLN COMPARISON:  Report of MRI brain 02/11/2002. The images are not currently available. FINDINGS: MRI CERVICAL SPINE FINDINGS Alignment: AP alignment is anatomic. There straightening of the normal cervical lordosis. Vertebrae: Endplate marrow changes are present at C5-6 and C6-7 most notably. Cord: Normal signal is present in cervical and upper thoracic spinal cord to the lowest imaged level, T2-3. Minimal hyperintensity on the sagittal T2 weighted images is not corroborated on the axial images and is likely artifact. There is no focal enhancement. Posterior  Fossa, vertebral arteries, paraspinal tissues: The craniocervical junction is normal. The visualized intracranial contents are normal. Disc levels: C2-3:  Negative. C3-4: A broad-based disc protrusion is present. There is slight distortion of the ventral cord without abnormal signal. Mild foraminal narrowing bilaterally secondary to uncovertebral and facet disease. C4-5: A more profound broad-based disc osteophyte complex narrows the central canal to 7.4 mm. Moderate foraminal narrowing is worse on the right. Uncovertebral and facet disease is present. C5-6: A broad-based disc protrusion is present. There is a far left lateral component. The central canal is narrowed to 7 mm. Severe foraminal stenosis is present bilaterally.  C6-7: Uncovertebral and facet disease contribute to moderate foraminal narrowing bilaterally. The central canal is patent. C7-T1:  Negative. No pathologic enhancement is present within the cervical spine. MRI LUMBAR SPINE FINDINGS Segmentation: 5 non rib-bearing lumbar type vertebral bodies are present. Alignment:  AP alignment is anatomic. Vertebrae: Marrow signal and vertebral body heights are normal. Small hemangiomas are present at L5, S1, and S3. Conus medullaris: Extends to the L2 level and appears normal. Paraspinal and other soft tissues: Limited imaging of the abdomen is unremarkable. There is no significant adenopathy. Disc levels: Short pedicles are present throughout the lumbar spine. L1-2:  Negative. L2-3: Mild disc bulging is present without significant stenosis. L3-4: A broad-based disc bulge is present. This extends into the left neural foramen. No significant stenosis is present. L4-5: A mild broad-based disc protrusion is present. Short pedicles and facet hypertrophy contribute to mild subarticular narrowing bilaterally. Mild bilateral foraminal stenosis is evident. L5-S1: A shallow central disc protrusion is present. Facet hypertrophy contributes to mild left foraminal narrowing. The central canal is patent. IMPRESSION: 1. No focal cord signal abnormality or lesion. 2. Multilevel spondylosis of the cervical spine as described. 3. Moderate central and mild bilateral foraminal stenosis at C3-4. 4. Moderate central and bilateral foraminal narrowing at C4-5. 5. Moderate central and severe bilateral foraminal stenosis at C5-6. 6. Moderate foraminal narrowing bilaterally at C6-7 without significant central canal stenosis. 7. Short pedicles in the cervical and lumbar spine contribute to the stenosis. 8. Disc bulging at L2-3 and L3-4 without significant stenosis. 9. Disc bulging and facet hypertrophy at L4-5 with mild subarticular and foraminal stenosis bilaterally. 10. Mild left foraminal narrowing at  L5-S1 secondary to asymmetric facet hypertrophy. Electronically Signed   By: San Morelle M.D.   On: 11/20/2016 17:10   Mr Lumbar Spine W Wo Contrast  Result Date: 11/20/2016 CLINICAL DATA:  Progressive weakness and numbness in the extremities associated with recent falls. Question demyelination. EXAM: MRI CERVICAL AND LUMBAR SPINE WITHOUT AND WITH CONTRAST TECHNIQUE: Multiplanar and multiecho pulse sequences of the cervical spine, to include the craniocervical junction and cervicothoracic junction, and lumbar spine, were obtained without and with intravenous contrast. CONTRAST:  50mL MULTIHANCE GADOBENATE DIMEGLUMINE 529 MG/ML IV SOLN COMPARISON:  Report of MRI brain 02/11/2002. The images are not currently available. FINDINGS: MRI CERVICAL SPINE FINDINGS Alignment: AP alignment is anatomic. There straightening of the normal cervical lordosis. Vertebrae: Endplate marrow changes are present at C5-6 and C6-7 most notably. Cord: Normal signal is present in cervical and upper thoracic spinal cord to the lowest imaged level, T2-3. Minimal hyperintensity on the sagittal T2 weighted images is not corroborated on the axial images and is likely artifact. There is no focal enhancement. Posterior Fossa, vertebral arteries, paraspinal tissues: The craniocervical junction is normal. The visualized intracranial contents are normal. Disc levels: C2-3:  Negative. C3-4: A broad-based disc protrusion is present.  There is slight distortion of the ventral cord without abnormal signal. Mild foraminal narrowing bilaterally secondary to uncovertebral and facet disease. C4-5: A more profound broad-based disc osteophyte complex narrows the central canal to 7.4 mm. Moderate foraminal narrowing is worse on the right. Uncovertebral and facet disease is present. C5-6: A broad-based disc protrusion is present. There is a far left lateral component. The central canal is narrowed to 7 mm. Severe foraminal stenosis is present  bilaterally. C6-7: Uncovertebral and facet disease contribute to moderate foraminal narrowing bilaterally. The central canal is patent. C7-T1:  Negative. No pathologic enhancement is present within the cervical spine. MRI LUMBAR SPINE FINDINGS Segmentation: 5 non rib-bearing lumbar type vertebral bodies are present. Alignment:  AP alignment is anatomic. Vertebrae: Marrow signal and vertebral body heights are normal. Small hemangiomas are present at L5, S1, and S3. Conus medullaris: Extends to the L2 level and appears normal. Paraspinal and other soft tissues: Limited imaging of the abdomen is unremarkable. There is no significant adenopathy. Disc levels: Short pedicles are present throughout the lumbar spine. L1-2:  Negative. L2-3: Mild disc bulging is present without significant stenosis. L3-4: A broad-based disc bulge is present. This extends into the left neural foramen. No significant stenosis is present. L4-5: A mild broad-based disc protrusion is present. Short pedicles and facet hypertrophy contribute to mild subarticular narrowing bilaterally. Mild bilateral foraminal stenosis is evident. L5-S1: A shallow central disc protrusion is present. Facet hypertrophy contributes to mild left foraminal narrowing. The central canal is patent. IMPRESSION: 1. No focal cord signal abnormality or lesion. 2. Multilevel spondylosis of the cervical spine as described. 3. Moderate central and mild bilateral foraminal stenosis at C3-4. 4. Moderate central and bilateral foraminal narrowing at C4-5. 5. Moderate central and severe bilateral foraminal stenosis at C5-6. 6. Moderate foraminal narrowing bilaterally at C6-7 without significant central canal stenosis. 7. Short pedicles in the cervical and lumbar spine contribute to the stenosis. 8. Disc bulging at L2-3 and L3-4 without significant stenosis. 9. Disc bulging and facet hypertrophy at L4-5 with mild subarticular and foraminal stenosis bilaterally. 10. Mild left foraminal  narrowing at L5-S1 secondary to asymmetric facet hypertrophy. Electronically Signed   By: San Morelle M.D.   On: 11/20/2016 17:10     Scheduled Meds: . aspirin EC  81 mg Oral Daily  . busPIRone  15 mg Oral BID  . ezetimibe  10 mg Oral Daily  . finasteride  5 mg Oral Daily  . lisinopril  10 mg Oral Daily   And  . hydrochlorothiazide  12.5 mg Oral Daily  . rosuvastatin  40 mg Oral q1800   Continuous Infusions: . Immune Globulin 10% 40 g (11/21/16 1744)     LOS: 1 day   Time Spent in minutes   30 minutes  Sven Pinheiro D.O. on 11/22/2016 at 3:19 PM  Between 7am to 7pm - Pager - [phone]  After 7pm go to [email or web address] - password [email or web address]  And look for the night coverage person covering for me after hours  [facility] Office  [phone]

## 2016-11-22 NOTE — Progress Notes (Addendum)
Neurology Progress Note   S:// No acute events overnight. Complains of mild headache this morning.  O:// Current vital signs: BP 135/75 (BP Location: Right Leg)   Pulse 77   Temp 98.1 F (36.7 C) (Oral)   Resp 17   Ht 6' (1.829 m)   Wt 103.7 kg (228 lb 9.6 oz)   SpO2 100%   BMI 31.00 kg/m  Vital signs in last 24 hours: Temp:  [97.8 F (36.6 C)-99.4 F (37.4 C)] 98.1 F (36.7 C) (10/07 0900) Pulse Rate:  [61-81] 77 (10/07 0900) Resp:  [16-20] 17 (10/07 0900) BP: (109-145)/(51-81) 135/75 (10/07 0900) SpO2:  [94 %-100 %] 100 % (10/07 0900) Some improvement of the exam with improved strength in the upper extremity. GENERAL: Awake, alert in NAD HEENT: - Normocephalic and atraumatic, dry mm, no LN++, no Thyromegally LUNGS - Clear to auscultation bilaterally with no wheezes CV - S1S2 RRR, no m/r/g, equal pulses bilaterally. ABDOMEN - Soft, nontender, nondistended with normoactive BS Ext: warm, well perfused, intact peripheral pulses, __ edema NEURO:  Essentially unchanged from yesterday Mental Status: AA&Ox3  Language: speech is Clear. Naming, repetition, fluency, and comprehension intact. Cranial Nerves: PERRL 8mm/brisk. EOMI, visual fields full, no facial asymmetry, facial sensation intact, hearing intact, tongue/uvula/soft palate midline, normal sternocleidomastoid and trapezius muscle strength. No evidence of tongue atrophy or fibrillations Motor:      RightLeft Shoulder Abduction5/55/5 Elbow Flexion5/55/5 Elbow Extension5/55/5 Wrist Flexion4+/54+/5 Wrist Extension5/55/5 Interossei5/55/5  Hip Flexion4/54/5 Knee  Flexion4/54/5 Knee Extension4/54/5 Ankle Dorsiflexion4/54/55 Ankle Plantarflexion4/54/5 Tone: is normal and bulk is normal Sensation- reduced vibration and light touch in a stocking pattern up to the ankles decreased sensation to pinprick and similarpatternon the lower extremity as well as in glovelike pattern in the upper extremity. Coordination: FTN intact bilaterally, no ataxia in BLE. Gait- deferred DTRs: 2+ biceps bilaterally,2+ triceps bilaterally, 3+ knee jerks bilaterally,2+ ankle jerks.  Medications  Current Facility-Administered Medications:  .  acetaminophen (TYLENOL) tablet 650 mg, 650 mg, Oral, Q6H PRN, Opyd, Ilene Qua, MD, 650 mg at 11/22/16 0620 .  aspirin EC tablet 81 mg, 81 mg, Oral, Daily, Kinnie Feil, MD, 81 mg at 11/21/16 0853 .  busPIRone (BUSPAR) tablet 15 mg, 15 mg, Oral, BID, Buriev, Arie Sabina, MD, 15 mg at 11/21/16 2125 .  ezetimibe (ZETIA) tablet 10 mg, 10 mg, Oral, Daily, Buriev, Arie Sabina, MD, 10 mg at 11/21/16 0853 .  finasteride (PROSCAR) tablet 5 mg, 5 mg, Oral, Daily, Buriev, Arie Sabina, MD, 5 mg at 11/21/16 0852 .  lisinopril (PRINIVIL,ZESTRIL) tablet 10 mg, 10 mg, Oral, Daily, 10 mg at 11/21/16 1000 **AND** hydrochlorothiazide (MICROZIDE) capsule 12.5 mg, 12.5 mg, Oral, Daily, Buriev, Arie Sabina, MD, 12.5 mg at 11/21/16 0854 .  Immune Globulin 10% (PRIVIGEN) IV infusion 40 g, 400 mg/kg, Intravenous, Q24 Hr x 5, Mikhail, [place], DO, Last Rate: 52 mL/hr at 11/21/16 1744, 40 g at 11/21/16 1744 .  rosuvastatin (CRESTOR) tablet 40 mg, 40 mg, Oral, q1800, Kinnie Feil, MD, 40 mg at 11/21/16 1724  Labs CBC    Component Value Date/Time   WBC 6.5 11/22/2016 0551   RBC 4.93 11/22/2016 0551   HGB 15.0 11/22/2016 0551   HCT 43.1 11/22/2016 0551   PLT 154 11/22/2016 0551   MCV 87.4 11/22/2016 0551    MCH 30.4 11/22/2016 0551   MCHC 34.8 11/22/2016 0551   RDW 12.8 11/22/2016 0551   LYMPHSABS 1.8 03/18/2016 1515   MONOABS 0.6 03/18/2016 1515   EOSABS 0.2 03/18/2016 1515  BASOSABS 0.0 03/18/2016 1515    CMP     Component Value Date/Time   NA 137 11/22/2016 0551   K 3.6 11/22/2016 0551   CL 106 11/22/2016 0551   CO2 23 11/22/2016 0551   GLUCOSE 85 11/22/2016 0551   BUN 18 11/22/2016 0551   CREATININE 0.82 11/22/2016 0551   CALCIUM 8.7 (L) 11/22/2016 0551   PROT 6.7 03/18/2016 1515   ALBUMIN 4.3 03/18/2016 1515   AST 19 03/18/2016 1515   ALT 26 03/18/2016 1515   ALKPHOS 182 (H) 03/18/2016 1515   BILITOT 0.7 03/18/2016 1515   GFRNONAA >60 11/22/2016 0551   GFRAA >60 11/22/2016 0551   Assessment:  66 year old man with history of diabetes, hyponatremia, hypertension, sleep apnea on CPAP, presenting with progressive paresthesias and weakness of hands followed by legs in a descending pattern. He is not areflexic or hyporeflexic. 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 Spinal tap revealed elevated protein of 81 and only 3 cells in the CSF.  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 Notify neurology of headache worsens or if he has focal neurologic symptoms.  We will continue to follow with you.  I spoke with the patient's wife yesterday and updated her with my plan. I have asked the nurse to page me when the wife arrives and would like speak with me and I will update her with today's plan.  -- Amie Portland, MD [facility] [phone]  If 7pm to 7am, please call on call as listed on AMION.

## 2016-11-23 LAB — GLUCOSE, CAPILLARY
GLUCOSE-CAPILLARY: 231 mg/dL — AB (ref 65–99)
Glucose-Capillary: 59 mg/dL — ABNORMAL LOW (ref 65–99)
Glucose-Capillary: 317 mg/dL — ABNORMAL HIGH (ref 65–99)
Glucose-Capillary: 203 mg/dL — ABNORMAL HIGH (ref 65–99)

## 2016-11-23 LAB — PROTEIN ELECTROPHORESIS, SERUM
A/G RATIO SPE: 1.2 (ref 0.7–1.7)
ALBUMIN ELP: 3.5 g/dL (ref 2.9–4.4)
Alpha-1-Globulin: 0.2 g/dL (ref 0.0–0.4)
Alpha-2-Globulin: 0.8 g/dL (ref 0.4–1.0)
Beta Globulin: 0.9 g/dL (ref 0.7–1.3)
Gamma Globulin: 0.9 g/dL (ref 0.4–1.8)
Globulin, Total: 2.9 g/dL (ref 2.2–3.9)
TOTAL PROTEIN ELP: 6.4 g/dL (ref 6.0–8.5)

## 2016-11-23 LAB — METHYLMALONIC ACID, SERUM: METHYLMALONIC ACID, QUANTITATIVE: 171 nmol/L (ref 0–378)

## 2016-11-23 LAB — VDRL, CSF: VDRL Quant, CSF: NONREACTIVE

## 2016-11-23 LAB — CSF CULTURE

## 2016-11-23 LAB — CSF CULTURE W GRAM STAIN: Culture: NO GROWTH

## 2016-11-23 MED ORDER — IMMUNE GLOBULIN (HUMAN) 20 GM/200ML IV SOLN
400.0000 mg/kg | INTRAVENOUS | Status: AC
  Administered 2016-11-23 – 2016-11-24 (×2): 40 g via INTRAVENOUS
  Filled 2016-11-23 (×2): qty 400

## 2016-11-23 MED ORDER — INSULIN PUMP
Freq: Three times a day (TID) | SUBCUTANEOUS | Status: DC
  Administered 2016-11-23: 5 via SUBCUTANEOUS
  Administered 2016-11-23: 7 via SUBCUTANEOUS
  Filled 2016-11-23: qty 1

## 2016-11-23 NOTE — Evaluation (Signed)
Occupational Therapy Evaluation Patient Details Name: Kenneth Pace MRN: [id number] DOB: [birth year] Today's Date: 11/23/2016    History of Present Illness 66 year old man with DM, HLD, HTN, OSA on CPAP, with progressive paresthesias and weakness of hands and legs, in a descending pattern, where it started from his upper extremities and now involves his lower extremities, with intact reflexes and may be hyperreflexia at the knee, and nerve conduction studies suggestive of a demyelinating pattern along with secondary axonal loss in the upper extremities. 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    Clinical Impression   PTA, pt was independent with ADL and functional mobility. He works full time and is very active. Pt currently requires min assist for fasteners during UB dressing tasks, min assist for LB ADL, min guard assist for toilet transfers, and supervision for self-feeding and grooming tasks. Pt presents with significantly diminished grasp strength bilaterally (L>R), decreased sensation in bilateral UE, and demonstrates rapid muscular fatigue impacting his ability to participate in ADL at PLOF. Pt would benefit from continued OT services while admitted to improve independence and safety with ADL and functional mobility prior to return home with his wife who can provide 24 hour assistance. Pt is very motivated to return to work and hobbies and would benefit from [employer] OT services post-acute D/C to maximize functional use of B UE work ADL and work tasks. Will continue to follow while admitted.     Follow Up Recommendations  Outpatient OT (Neuro-outpatient OT)    Equipment Recommendations  Tub/shower seat;3 in 1 bedside commode    Recommendations for Other Services       Precautions / Restrictions Precautions Precautions: Fall Restrictions Weight Bearing Restrictions: No      Mobility Bed Mobility               General bed  mobility comments: OOB in chair on my arrival   Transfers Overall transfer level: Needs assistance Equipment used: None Transfers: Sit to/from Stand Sit to Stand: Min guard         General transfer comment: Min guard assist without RW this session.     Balance Overall balance assessment: Needs assistance Sitting-balance support: No upper extremity supported;Feet supported Sitting balance-Leahy Scale: Normal     Standing balance support: No upper extremity supported Standing balance-Leahy Scale: Fair Standing balance comment: Min guard assist during dynamic tasks.                            ADL either performed or assessed with clinical judgement   ADL Overall ADL's : Needs assistance/impaired Eating/Feeding: Sitting;Supervision/ safety Eating/Feeding Details (indicate cue type and reason): Fatigues easily and difficulty increases as the day progresses. Provided red tubing to improve ability to manipulate utensils.  Grooming: Supervision/safety;Standing   Upper Body Bathing: Sitting;Supervision/ safety   Lower Body Bathing: Minimal assistance;Sit to/from stand   Upper Body Dressing : Minimal assistance;Sitting Upper Body Dressing Details (indicate cue type and reason): for fasteners Lower Body Dressing: Minimal assistance;Sit to/from stand   Toilet Transfer: Min guard;Ambulation   Toileting- Clothing Manipulation and Hygiene: Min guard;Sit to/from stand       Functional mobility during ADLs: Min guard General ADL Comments: Pt unsteady on feet and requiring min guard assist for safety.      Vision Patient Visual Report: No change from baseline Vision Assessment?: No apparent visual deficits     Perception     Praxis  Pertinent Vitals/Pain Pain Assessment: No/denies pain     Hand Dominance Right   Extremity/Trunk Assessment Upper Extremity Assessment Upper Extremity Assessment: RUE deficits/detail;LUE deficits/detail RUE Deficits /  Details: Grossly 4+/5 strength at elbow and shoulder. 4/5 grasp strength. Fatigues easily. RUE Sensation: decreased light touch (numbness from fingers to midforearm) RUE Coordination: decreased fine motor LUE Deficits / Details: Grossly decreased strength to 4+/5 at elbow and shoulder. Grasp strength 4/5 and pt easily fatigues. Decreased coordination for fine motor tasks greater than R.  LUE Sensation: decreased light touch (from finger tips to mid forearm) LUE Coordination: decreased fine motor   Lower Extremity Assessment Lower Extremity Assessment: Defer to PT evaluation       Communication Communication Communication: No difficulties   Cognition Arousal/Alertness: Awake/alert Behavior During Therapy: WFL for tasks assessed/performed Overall Cognitive Status: Within Functional Limits for tasks assessed                                     General Comments       Exercises     Shoulder Instructions      Home Living Family/patient expects to be discharged to:: Private residence Living Arrangements: Spouse/significant other Available Help at Discharge: Family;Available 24 hours/day Type of Home: House Home Access: Stairs to enter [employer] of Steps: 3 Entrance Stairs-Rails: None Home Layout: Two level;Bed/bath upstairs;Able to live on main level with bedroom/bathroom Alternate Level Stairs-Number of Steps: 17 Alternate Level Stairs-Rails: Can reach both Bathroom Shower/Tub: Teacher, early years/pre: Standard     Home Equipment: Kasandra Knudsen - single point;Crutches          Prior Functioning/Environment Level of Independence: Independent        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        OT Problem List: Decreased strength;Decreased activity tolerance;Impaired balance (sitting and/or standing);Decreased coordination;Decreased cognition;Decreased safety awareness;Impaired UE functional use       OT Treatment/Interventions: Self-care/ADL training;Therapeutic exercise;Energy conservation;DME and/or AE instruction;Therapeutic activities;Patient/family education;Balance training    OT Goals(Current goals can be found in the care plan section) Acute Rehab OT Goals Patient Stated Goal: Get stronger OT Goal Formulation: With patient Time For Goal Achievement: 12/07/16 Potential to Achieve Goals: Good ADL Goals Pt Will Perform Eating: with modified independence;sitting;with adaptive utensils Pt Will Perform Grooming: with modified independence;standing Pt Will Perform Upper Body Dressing: with modified independence;sitting Pt Will Perform Lower Body Dressing: with modified independence;sit to/from stand Pt Will Transfer to Toilet: with modified independence;ambulating;regular height toilet Pt Will Perform Toileting - Clothing Manipulation and hygiene: with modified independence;sit to/from stand Pt/caregiver will Perform Home Exercise Program: Increased strength;Both right and left upper extremity;With Supervision;With written HEP provided (grasp strength and fine motor coordination)  OT Frequency: Min 2X/week   Barriers to D/C:            Co-evaluation              AM-PAC PT "6 Clicks" Daily Activity     Outcome Measure Help from another person eating meals?: A Little Help from another person taking care of personal grooming?: A Little Help from another person toileting, which includes using toliet, bedpan, or urinal?: A Little Help from another person bathing (including washing, rinsing, drying)?: A Little Help from another person to put on and taking off regular upper body clothing?: A Little Help from another person to  put on and taking off regular lower body clothing?: A Little 6 Click Score: 18   End of Session Nurse Communication: Mobility status  Activity Tolerance: Patient tolerated treatment well Patient left: in chair;with call bell/phone within reach  OT  Visit Diagnosis: Muscle weakness (generalized) (M62.81)                Time: 7035-0093 OT Time Calculation (min): 29 min Charges:  OT General Charges $OT Visit: 1 Visit OT Evaluation $OT Eval Moderate Complexity: 1 Mod OT Treatments $Self Care/Home Management : 8-22 mins G-Codes:     Norman Herrlich, MS OTR/L  Pager: [place] A Zac Torti 11/23/2016, 11:19 AM

## 2016-11-23 NOTE — Progress Notes (Signed)
PROGRESS NOTE    Kenneth Pace  JME:[id number] DOB: 04-15-1950 DOA: 11/20/2016 PCP: Renato Shin, MD   Chief Complaint  Patient presents with  . Weakness    Brief Narrative:  HPI on 11/20/2016 by Dr. Rowe Clack Kenneth Pace is a 66 y.o. male with PMH of IDDM, HTN, DJD, BPH presented from neurology office for possible peripheral demyelinating neuropathy, ? atypical gbs. Patient states that he developed progressive weakness, numbness in his extremities associated with recurrent falls for the last 2 weeks. He underwent emg per Dr. Posey Pronto who suspected demyelination and referred to ED for further evaluation. He reports descending progressive weakness in his extremities. No diplopia, no dysarthria, no vertigo, no back pains, no headaches, no bulbar symptoms. He denies acute trauma of injury, no recent tick bites. No fevers, no acute shortness of breath, no chest pains, no nausea, vomiting or diarrhea.  -ED: patient is stable. Neurology ordered mri, LP. 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 -Neurology consulted and appreciated -Status post lumbar puncture which showed protein of 81, 3 WBC -Continue IVIG, today 4/5 -PT consulted and recommended outpatient PT, rolling walker with 5"wheels -OT consulted and recommended outpatient OT, tub/shower seat, 3in1 bedside commode  Diabetes mellitus, type I -on insulin pump -diabetes coordinator also following   Essential hypertension -Continue Lisinopril, HCTZ   BPH -Continue Proscar  Hyperlipidemia -Continue statin, Zetia  DJD -Naproxen, Mobitz held  DVT Prophylaxis   SCDs  Code Status: Full  Family Communication: None at bedside  Disposition Plan: Admitted. Pending completion of IVIG, home likely 10/9  Consultants Neurology  Procedures  Lumbar puncture  Antibiotics   Anti-infectives    None      Subjective:   Kenneth Pace seen and examined today.  Patient feeling better today. Denies chest pain, shortness of breath, abdominal pain, N/V/D/C, dizziness, headache.   Objective:   Vitals:   11/22/16 2114 11/23/16 0054 11/23/16 0509 11/23/16 1003  BP: 112/65 130/69 126/68 (!) 144/81  Pulse: 65 64 65 79  Resp: 18 18 18 18   Temp: 98.1 F (36.7 C) 97.8 F (36.6 C) 97.8 F (36.6 C) 98.9 F (37.2 C)  TempSrc: Oral Oral Oral Oral  SpO2: 100% 100% 99% 99%  Weight:      Height:        Intake/Output Summary (Last 24 hours) at 11/23/16 1143 Last data filed at 11/23/16 0126  Gross per 24 hour  Intake              180 ml  Output                0 ml  Net              180 ml   Filed Weights   11/20/16 1143 11/20/16 1144 11/20/16 1728  Weight: 107.5 kg (237 lb) 107.5 kg (237 lb) 103.7 kg (228 lb 9.6 oz)   Exam  General: Well developed, well nourished, NAD, appears stated age  HEENT: NCAT, mucous membranes moist.   Cardiovascular: S1 S2 auscultated, RRR, no murmurs  Respiratory: Clear to auscultation bilaterally with equal chest rise  Abdomen: Soft, nontender, nondistended, + bowel sounds  Extremities: warm dry without cyanosis clubbing or edema  Neuro: AAOx3, nonfocal, decreased grip strength  Psych: Appropriate mood and affect, pleasant   Data Reviewed: I have personally reviewed following labs and imaging studies  CBC:  Recent Labs Lab 11/20/16 1146 11/22/16 0551  WBC 8.3 6.5  HGB 16.6 15.0  HCT 46.8 43.1  MCV 86.8 87.4  PLT 192 970   Basic Metabolic Panel:  Recent Labs Lab 11/20/16 1146 11/22/16 0551  NA 137 137  K 4.4 3.6  CL 104 106  CO2 26 23  GLUCOSE 230* 85  BUN 22* 18  CREATININE 0.90 0.82    CALCIUM 9.2 8.7*   GFR: Estimated Creatinine Clearance: 113.3 mL/min (by C-G formula based on SCr of 0.82 mg/dL). Liver Function Tests: No results for input(s): AST, ALT, ALKPHOS, BILITOT, PROT, ALBUMIN in the last 168 hours. No results for input(s): LIPASE, AMYLASE in the last 168 hours. No results for input(s): AMMONIA in the last 168 hours. Coagulation Profile: No results for input(s): INR, PROTIME in the last 168 hours. Cardiac Enzymes: No results for input(s): CKTOTAL, CKMB, CKMBINDEX, TROPONINI in the last 168 hours. BNP (last 3 results) No results for input(s): PROBNP in the last 8760 hours. HbA1C:  Recent Labs  11/20/16 1857  HGBA1C 7.3*   CBG:  Recent Labs Lab 11/22/16 1122 11/22/16 1629 11/22/16 2111 11/23/16 0641 11/23/16 1059  GLUCAP 172* 230* 170* 59* 231*   Lipid Profile: No results for input(s): CHOL, HDL, LDLCALC, TRIG, CHOLHDL, LDLDIRECT in the last 72 hours. Thyroid Function Tests:  Recent Labs  11/20/16 1857  TSH 1.051   Anemia Panel:  Recent Labs  11/20/16 1857  VITAMINB12 486   Urine analysis:    Component Value Date/Time   COLORURINE AMBER (A) 11/20/2016 1202   APPEARANCEUR HAZY (A) 11/20/2016 1202   LABSPEC 1.022 11/20/2016 1202   PHURINE 5.0 11/20/2016 1202   GLUCOSEU >=500 (A) 11/20/2016 1202   GLUCOSEU NEGATIVE 03/18/2016 1515   HGBUR NEGATIVE 11/20/2016 1202   BILIRUBINUR NEGATIVE 11/20/2016 1202   KETONESUR 5 (A) 11/20/2016 1202   PROTEINUR NEGATIVE 11/20/2016 1202   UROBILINOGEN 1.0 03/18/2016 1515   NITRITE NEGATIVE 11/20/2016 1202   LEUKOCYTESUR NEGATIVE 11/20/2016 1202   Sepsis Labs: @LABRCNTIP (procalcitonin:4,lacticidven:4)  ) Recent Results (from the past 240 hour(s))  Anaerobic culture     Status: None (Preliminary result)   Collection Time: 11/20/16  2:51 PM  Result Value Ref Range Status   Specimen Description CSF  Final   Special Requests NONE  Final   Culture   Final    NO ANAEROBES ISOLATED; CULTURE  IN PROGRESS FOR 5 DAYS   Report Status PENDING  Incomplete  CSF culture     Status: None (Preliminary result)   Collection Time: 11/20/16  2:51 PM  Result Value Ref Range Status   Specimen Description CSF  Final   Special Requests NONE  Final   Gram Stain   Final    WBC PRESENT, PREDOMINANTLY MONONUCLEAR NO ORGANISMS SEEN CYTOSPIN SMEAR    Culture NO GROWTH 3 DAYS  Final   Report Status PENDING  Incomplete      Radiology Studies: No results found.   Scheduled Meds: . aspirin EC  81 mg Oral Daily  . busPIRone  15 mg Oral BID  . ezetimibe  10 mg Oral Daily  . finasteride  5 mg Oral Daily  . lisinopril  10 mg Oral Daily   And  . hydrochlorothiazide  12.5 mg Oral Daily  . insulin pump   Subcutaneous TID AC, HS,  0200  . rosuvastatin  40 mg Oral q1800   Continuous Infusions: . Immune Globulin 10%       LOS: 2 days   Time Spent in minutes   30 minutes  Kenneth Pace D.O. on 11/23/2016 at 11:43 AM  Between 7am to 7pm - Pager - [phone]  After 7pm go to [email or web address] - password [email or web address]  And look for the night coverage person covering for me after hours  [facility] Office  [phone]

## 2016-11-23 NOTE — Progress Notes (Addendum)
Inpatient Diabetes Program Recommendations  AACE/ADA: New Consensus Statement on Inpatient Glycemic Control (2015)  Target Ranges:  Prepandial:   less than 140 mg/dL      Peak postprandial:   less than 180 mg/dL (1-2 hours)      Critically ill patients:  140 - 180 mg/dL   Lab Results  Component Value Date   GLUCAP 231 (H) 11/23/2016   HGBA1C 7.3 (H) 11/20/2016    Review of Glycemic Control  Diabetes history: DM 1 Outpatient Diabetes medications: Insulin pump, Humalog Current orders for Inpatient glycemic control: None, No CBG check order  Inpatient Diabetes Program Recommendations:    Please place orders for insulin pump order set which orders CBG checks and allows [place] to document insulin boluses per insulin pump in our MAR.  Patient sees Dr. Loanne Drilling, Endocrinologist, and last saw him within the last 2 weeks. Patient does not have his continuous glucose monitor on. But has it at bedside to place when discharged for home. Patient has been on pump for 20+ years.   Thanks,  Tama Headings RN, MSN, [facility] Inpatient Diabetes Coordinator Team Pager [phone] (8a-5p)

## 2016-11-23 NOTE — Progress Notes (Signed)
Patient's lunchtime CBG 231. Patient is aware and will titrate insulin pump accordingly. Patient has had insulin pump for 20 years and is proficient with its use. Kenneth Pace

## 2016-11-23 NOTE — Progress Notes (Signed)
Pt has orders put in by Dr Lin Landsman  that needed clarification, Dr Myna Hidalgo (on call) paged and notified, called back and asked if pt had an LP done, none was done today, pt verified that he had an LP done on Friday, Dr Opyd advised calling IR to clarify orders, same done but no answer as they are closed for the day, will however continue to monitor. Kenneth Pace, Kristyl [place] Efe

## 2016-11-23 NOTE — Progress Notes (Signed)
Subjective: No change over night. Will have 4th IVIG today  Objective: Current vital signs: BP 126/68 (BP Location: Right Arm)   Pulse 65   Temp 97.8 F (36.6 C) (Oral)   Resp 18   Ht 6' (1.829 m)   Wt 103.7 kg (228 lb 9.6 oz)   SpO2 99%   BMI 31.00 kg/m  Vital signs in last 24 hours: Temp:  [97.8 F (36.6 C)-98.1 F (36.7 C)] 97.8 F (36.6 C) (10/08 0509) Pulse Rate:  [64-84] 65 (10/08 0509) Resp:  [16-18] 18 (10/08 0509) BP: (112-138)/(61-75) 126/68 (10/08 0509) SpO2:  [91 %-100 %] 99 % (10/08 0509)  Intake/Output from previous day: 10/07 0701 - 10/08 0700 In: 180 [P.O.:180] Out: -  Intake/Output this shift: No intake/output data recorded. Nutritional status: Diet Carb Modified Fluid consistency: Thin; Room service appropriate? Yes  ROS:                                                                                                                                       History obtained from the patient  General ROS: negative for - chills, fatigue, fever, night sweats, weight gain or weight loss Psychological ROS: negative for - behavioral disorder, hallucinations, memory difficulties, mood swings or suicidal ideation Ophthalmic ROS: negative for - blurry vision, double vision, eye pain or loss of vision ENT ROS: negative for - epistaxis, nasal discharge, oral lesions, sore throat, tinnitus or vertigo Allergy and Immunology ROS: negative for - hives or itchy/watery eyes Hematological and Lymphatic ROS: negative for - bleeding problems, bruising or swollen lymph nodes Endocrine ROS: negative for - galactorrhea, hair pattern changes, polydipsia/polyuria or temperature intolerance Respiratory ROS: negative for - cough, hemoptysis, shortness of breath or wheezing Cardiovascular ROS: negative for - chest pain, dyspnea on exertion, edema or irregular heartbeat Gastrointestinal ROS: negative for - abdominal pain, diarrhea, hematemesis, nausea/vomiting or stool  incontinence Genito-Urinary ROS: negative for - dysuria, hematuria, incontinence or urinary frequency/urgency Musculoskeletal ROS: positive for -  muscular weakness Neurological ROS: as noted in HPI Dermatological ROS: negative for rash and skin lesion changes    Neurologic Exam:  There was no change on exam compared to day prior  NEURO: Essentially unchanged from yesterday Mental Status: AA&Ox3  Language: speech is Clear. Naming, repetition, fluency, and comprehension intact. Cranial Nerves: PERRL 74mm/brisk. EOMI, visual fields full, no facial asymmetry, facial sensation intact, hearing intact, tongue/uvula/soft palate midline, normal sternocleidomastoid and trapezius muscle strength. No evidence of tongue atrophy or fibrillations Motor:                                        RightLeft Shoulder Abduction5/55/5 Elbow Flexion5/55/5 Elbow Extension5/55/5 Wrist Flexion4+/54+/5 Wrist Extension5/55/5 Interossei5/55/5  Hip Flexion4/54/5 Knee Flexion4/54/5 Knee Extension4/54/5 Ankle Dorsiflexion4/54/55 Ankle Plantarflexion4/54/5 Tone: is normal and bulk is normal Sensation- reduced vibration  and light touch in a stocking pattern up to the ankles decreased sensation to pinprick and similarpatternon the lower extremity as well as in glovelike pattern in the upper extremity. Coordination: FTN intact bilaterally, no ataxia in BLE. Gait- deferred DTRs: 2+ biceps bilaterally,2+ triceps bilaterally, 3+ knee  jerks bilaterally,2+ ankle jerks.   Lab Results: Basic Metabolic Panel:  Recent Labs Lab 11/20/16 1146 11/22/16 0551  NA 137 137  K 4.4 3.6  CL 104 106  CO2 26 23  GLUCOSE 230* 85  BUN 22* 18  CREATININE 0.90 0.82  CALCIUM 9.2 8.7*    Liver Function Tests: No results for input(s): AST, ALT, ALKPHOS, BILITOT, PROT, ALBUMIN in the last 168 hours. No results for input(s): LIPASE, AMYLASE in the last 168 hours. No results for input(s): AMMONIA in the last 168 hours.  CBC:  Recent Labs Lab 11/20/16 1146 11/22/16 0551  WBC 8.3 6.5  HGB 16.6 15.0  HCT 46.8 43.1  MCV 86.8 87.4  PLT 192 154    Cardiac Enzymes: No results for input(s): CKTOTAL, CKMB, CKMBINDEX, TROPONINI in the last 168 hours.  Lipid Panel: No results for input(s): CHOL, TRIG, HDL, CHOLHDL, VLDL, LDLCALC in the last 168 hours.  CBG:  Recent Labs Lab 11/22/16 0626 11/22/16 1122 11/22/16 1629 11/22/16 2111 11/23/16 0641  GLUCAP 74 172* 230* 170* 73*    Microbiology: Results for orders placed or performed during the hospital encounter of 11/20/16  Anaerobic culture     Status: None (Preliminary result)   Collection Time: 11/20/16  2:51 PM  Result Value Ref Range Status   Specimen Description CSF  Final   Special Requests NONE  Final   Culture   Final    NO ANAEROBES ISOLATED; CULTURE IN PROGRESS FOR 5 DAYS   Report Status PENDING  Incomplete  CSF culture     Status: None (Preliminary result)   Collection Time: 11/20/16  2:51 PM  Result Value Ref Range Status   Specimen Description CSF  Final   Special Requests NONE  Final   Gram Stain   Final    WBC PRESENT, PREDOMINANTLY MONONUCLEAR NO ORGANISMS SEEN CYTOSPIN SMEAR    Culture NO GROWTH 2 DAYS  Final   Report Status PENDING  Incomplete    Coagulation Studies: No results for input(s): LABPROT, INR in the last 72 hours.  Imaging: No results found.  Medications:  Scheduled: . aspirin EC  81 mg Oral Daily  . busPIRone  15 mg  Oral BID  . ezetimibe  10 mg Oral Daily  . finasteride  5 mg Oral Daily  . lisinopril  10 mg Oral Daily   And  . hydrochlorothiazide  12.5 mg Oral Daily  . rosuvastatin  40 mg Oral q1800    Etta Quill PA-C [facility] [phone]  11/23/2016, 9:14 AM   ATTENDING ADDENDUM Pt seen and examined. Agree with H&P above. A&P below.  Assessment/Plan:  Assessment:  66 year old man with history of diabetes, hyponatremia, hypertension, sleep apnea on CPAP, presenting with progressive paresthesias and weakness of hands followed by legs in a descending pattern. He is not areflexic or hyporeflexic. 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 Spinal tap revealed elevated protein of 81 and only 3 cells in the CSF.  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 It has been explained to patient that IVIG peaks in  few weeks and he will follow up with  Primary neurologist as out patient .  ASA 81 mg Will need OT for right hand clumsiness.  PT recommends Home PT.  We will continue to follow with you.  -- Amie Portland, MD [facility] [phone]  If 7pm to 7am, please call on call as listed on AMION.

## 2016-11-23 NOTE — Progress Notes (Signed)
Pt arrived unit safely , @ 18.40p.

## 2016-11-24 LAB — GLUCOSE, CAPILLARY
GLUCOSE-CAPILLARY: 129 mg/dL — AB (ref 65–99)
GLUCOSE-CAPILLARY: 260 mg/dL — AB (ref 65–99)
Glucose-Capillary: 104 mg/dL — ABNORMAL HIGH (ref 65–99)
Glucose-Capillary: 157 mg/dL — ABNORMAL HIGH (ref 65–99)

## 2016-11-24 MED ORDER — ACETAMINOPHEN 325 MG PO TABS: 650.0000 mg | ORAL_TABLET | Freq: Four times a day (QID) | ORAL | Status: DC | PRN

## 2016-11-24 NOTE — Progress Notes (Signed)
Physical Therapy Treatment Patient Details Name: Kenneth Pace MRN: [id number] DOB: Jun 28, 1950 Today's Date: 11/24/2016    History of Present Illness 66 year old man with DM, HLD, HTN, OSA on CPAP, with progressive paresthesias and weakness of hands and legs, in a descending pattern, where it started from his upper extremities and now involves his lower extremities, with intact reflexes and may be hyperreflexia at the knee, and nerve conduction studies suggestive of a demyelinating pattern along with secondary axonal loss in the upper extremities. 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     PT Comments    Patient is progressing toward mobility goals. Needed min VCs for handling of the walker during ambulation. Patient had one report of fatigue and weakness during last trial of stair management requiring min guard for safety. Overall, patient is safe to d/c home with supervision for mobility. Outpatient PT will be beneficial to educate on proper DME use, address deficits and maximize functional independence. PT will continue to follow acutely and progress as tolerated.   Follow Up Recommendations  Supervision for mobility/OOB;Outpatient PT     Equipment Recommendations  Rolling walker with 5" wheels    Recommendations for Other Services       Precautions / Restrictions Precautions Precautions: Fall Restrictions Weight Bearing Restrictions: No    Mobility  Bed Mobility Overal bed mobility: Modified Independent             General bed mobility comments: increased time and effort to elevate trunk; use bed rails to assist  Transfers Overall transfer level: Needs assistance Equipment used: Rolling walker (2 wheeled) Transfers: Sit to/from Stand Sit to Stand: Modified independent (Device/Increase time)            Ambulation/Gait Ambulation/Gait assistance: Supervision;Min guard Ambulation Distance (Feet): 200 Feet Assistive  device: Rolling walker (2 wheeled) Gait Pattern/deviations: Step-through pattern;Narrow base of support;Ataxic;Drifts right/left Gait velocity: decreased Gait velocity interpretation: Below normal speed for age/gender General Gait Details: no reports of buckling or fatigue during ambulation. Min guard intermittently for turns with demonstrated instability. Needed VCs for proper handling of RW to smoothly advance it ahead of him and keep it on the ground.    Stairs Stairs: Yes   Stair Management: One rail Right;Alternating pattern;Step to pattern Number of Stairs: 16 (4 sets of 4 steps due to IV connection) General stair comments: Preferred rails for stability needing min guard during last trial due to fatigue and patient reported weakness of R knee. Overall supervision for safety and min VCs for appropriate step clearance  Wheelchair Mobility    Modified Rankin (Stroke Patients Only)       Balance Overall balance assessment: Needs assistance Sitting-balance support: Feet supported Sitting balance-Leahy Scale: Normal     Standing balance support: During functional activity;Bilateral upper extremity supported Standing balance-Leahy Scale: Fair Standing balance comment: able to maintain brief moments of static standing without support, unable to tolerate challenge.                             Cognition Arousal/Alertness: Awake/alert Behavior During Therapy: WFL for tasks assessed/performed Overall Cognitive Status: Within Functional Limits for tasks assessed                                        Exercises      General Comments  Pertinent Vitals/Pain Pain Assessment: No/denies pain    Home Living                      Prior Function            PT Goals (current goals can now be found in the care plan section) Acute Rehab PT Goals Patient Stated Goal: Get stronger PT Goal Formulation: With patient Time For Goal  Achievement: 12/05/16 Potential to Achieve Goals: Good Progress towards PT goals: Progressing toward goals    Frequency    Min 4X/week      PT Plan Current plan remains appropriate    Co-evaluation              AM-PAC PT "6 Clicks" Daily Activity  Outcome Measure  Difficulty turning over in bed (including adjusting bedclothes, sheets and blankets)?: None Difficulty moving from lying on back to sitting on the side of the bed? : A Little Difficulty sitting down on and standing up from a chair with arms (e.g., wheelchair, bedside commode, etc,.)?: A Little Help needed moving to and from a bed to chair (including a wheelchair)?: A Little Help needed walking in hospital room?: A Little Help needed climbing 3-5 steps with a railing? : A Lot 6 Click Score: 18    End of Session Equipment Utilized During Treatment: Gait belt Activity Tolerance: Patient tolerated treatment well Patient left: in chair;with call bell/phone within reach Nurse Communication: Mobility status PT Visit Diagnosis: Unsteadiness on feet (R26.81);Other symptoms and signs involving the nervous system (R29.898);History of falling (Z91.81)     Time: 0263-7858 PT Time Calculation (min) (ACUTE ONLY): 13 min  Charges:  $Gait Training: 8-22 mins                    G CodesSindy Guadeloupe, SPT [phone] office    [name] 11/24/2016, 3:22 PM

## 2016-11-24 NOTE — Progress Notes (Addendum)
Patient c/o increasing headache 7/10 while IVIG infusing, tylenol already administered. RN reduce rate from 430ml/hr to 207 ml/hr. Pt is now verbalizing the HA is not increasing. Per Dr. Rory Percy to give another dose of Tylenol. VSS. Will continue to monitor.    Pt declined tylenol at this time verbalized that his HA reduce from 7/10 to a 4/10. Will continue to monitor,   Ave Filter, RN

## 2016-11-24 NOTE — Discharge Summary (Signed)
Physician Discharge Summary  CAYDEN GRANHOLM VWU:[id number] DOB: [birth year] DOA: 11/20/2016  PCP: Renato Shin, MD  Admit date: 11/20/2016 Discharge date: 11/24/2016  Time spent: 45 minutes  Recommendations for Outpatient Follow-up:  Patient will be discharged to home with outpatient physical and occupational therapy.  Patient will need to follow up with primary care provider within one week of discharge.  Follow up with Dr. Posey Pronto, neurology. Patient should continue medications as prescribed.  Patient should follow a heart healthy/carb modified diet.   Discharge Diagnoses:  Progressive Paresthesias and weakness of the hands and legs Diabetes mellitus, type I Essential hypertension BPH Hyperlipidemia DJD  Discharge Condition: Stable   Diet recommendation: heart healthy/carb modified   Filed Weights   11/20/16 1143 11/20/16 1144 11/20/16 1728  Weight: 107.5 kg (237 lb) 107.5 kg (237 lb) 103.7 kg (228 lb 9.6 oz)    History of present illness:  on 11/20/2016 by Dr. Carolin Coy Weatherlyis a 66 y.o.malewith PMH of IDDM, HTN, DJD, BPH presented from neurology office for possible peripheral demyelinating neuropathy, ? atypical gbs. Patient states that he developed progressive weakness, numbness in his extremities associated with recurrent falls for the last 2 weeks. He underwent emg per Dr. Posey Pronto who suspected demyelination and referred to ED for further evaluation. He reports descending progressive weakness in his extremities. No diplopia, no dysarthria, no vertigo, no back pains, no headaches, no bulbar symptoms. He denies acute trauma of injury, no recent tick bites. No fevers, no acute shortness of breath, no chest pains, no nausea, vomiting or diarrhea.  -ED: patient is stable. Neurology ordered mri, LP. 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 -Neurology consulted and appreciated -Status post lumbar puncture which showed protein of 81, 3 WBC -completed 5 doses of IVIG -PT consulted and recommended outpatient PT, rolling walker with 5"wheels -OT consulted and recommended outpatient OT, tub/shower seat, 3in1 bedside commode  Diabetes mellitus, type I -Continue management with insulin pump  Essential hypertension -Continue Lisinopril, HCTZ   BPH -Continue Proscar  Hyperlipidemia -Continue statin, Zetia  DJD -Naproxen, Mobitz held- may restart on discharge   Procedures: Lumbar puncture  Consultations: Neurology  Discharge Exam: Vitals:   11/24/16 0100 11/24/16 0918  BP: 127/72 117/71  Pulse: 71 78  Resp: 18 18  Temp: 99 F (37.2 C) 98.8 F (37.1 C)  SpO2: 94% 98%   Patient states he had a headache overnight, but felt better after tylenol. Denies chest pain, shortness of breath, abdominal pain, N/V/D/C. Feels his strength is improving, but still having problems with small, precise movements.    General: Well developed, well nourished, NAD, appears stated age  66: NCAT, mucous membranes moist.  Cardiovascular: S1 S2 auscultated, RRR, no murmurs  Respiratory: Clear to auscultation bilaterally with equal chest rise  Abdomen: Soft, nontender, nondistended, + bowel sounds  Extremities: warm dry without cyanosis clubbing or edema  Neuro: AAOx3, no new deficits, improving strength  Psych: Appropriate mood and affect, pleasant   Discharge Instructions Discharge Instructions    Discharge instructions    Complete by:  As directed    Patient will be discharged to home with outpatient physical and occupational therapy.  Patient will need to follow up with primary care  provider within one week of discharge.  Follow up with Dr. Posey Pronto, neurology. Patient should continue medications as prescribed.  Patient should follow a heart healthy/carb modified diet.     Current Discharge Medication List    START taking these medications   Details  acetaminophen (TYLENOL) 325 MG tablet Take 2 tablets (650 mg total) by mouth every 6 (six) hours as needed for mild pain or headache.      CONTINUE these medications which have NOT CHANGED   Details  busPIRone (BUSPAR) 30 MG tablet TAKE ONE-HALF (1/2) TABLET (15 MG) TWICE A DAY Qty: 180 tablet, Refills: 1    co-enzyme Q-10 30 MG capsule Take 30 mg by mouth daily.      Continuous Glucose Monitor Sup (ENLITE GLUCOSE SENSOR) MISC 1 Device by Does not apply route every 3 (three) days. Qty: 30 each, Refills: 3    Cranberry 400 MG TABS Take 2 each by mouth daily.      Cyanocobalamin (VITAMIN B-12 CR PO) Take 1 each by mouth daily.      ezetimibe (ZETIA) 10 MG tablet TAKE 1 TABLET DAILY Qty: 90 tablet, Refills: 3    finasteride (PROSCAR) 5 MG tablet Take 5 mg by mouth daily.     glucagon 1 MG injection Inject 1 mg into the vein once as needed. Qty: 1 each, Refills: 12    glucose blood (BAYER CONTOUR NEXT TEST) test strip Use to check blood sugar 5 times per day dx 250.01, Qty: 500 each, Refills: 3    HUMALOG 100 UNIT/ML injection INJECT 120 UNITS DAILY IN PUMP Qty: 100 mL, Refills: 11    !! Insulin Infusion Pump Supplies (PARADIGM RESERVOIR 3ML) MISC 1 Device by Does not apply route every 3 (three) days. Qty: 30 each, Refills: 3    !! Insulin Infusion Pump Supplies (QUICK-SET INFUSION 43" 9MM) MISC 1 Device by Does not apply route every 3 (three) days. Qty: 30 each, Refills: 3    Lancets Misc. (ACCU-CHEK MULTICLIX LANCET DEV) KIT 1 Device by Other route 5 (five) times daily as needed. 5/day dx 250.01 Qty: 450 each, Refills: 3    lisinopril-hydrochlorothiazide (PRINZIDE,ZESTORETIC) 10-12.5 MG tablet TAKE 1 TABLET  DAILY Qty: 90 tablet, Refills: 1    meloxicam (MOBIC) 15 MG tablet TK 1 T PO BID FOR 7 DAYS THEN QD PRN Refills: 0    Misc. Devices MISC IV 3000 infusion set cover.  Change every 3 days Qty: 30 each, Refills: 3    Multiple Vitamins-Minerals (MULTIVITAMIN,TX-MINERALS) tablet Take 1 tablet by mouth daily.      naproxen (NAPROSYN) 375 MG tablet Take 1 tablet (375 mg total) by mouth 2 (two) times daily with a meal. Qty: 60 tablet, Refills: 1    rosuvastatin (CRESTOR) 40 MG tablet TAKE 1 TABLET DAILY Qty: 90 tablet, Refills: 3     !! - Potential duplicate medications found. Please discuss with provider.    STOP taking these medications     aspirin 325 MG tablet      aspirin 81 MG tablet        No Known Allergies Follow-up Information    Renato Shin, MD. Schedule an appointment as soon as possible for a visit in 1 week(s).   Specialty:  Endocrinology Why:  Hospital follow up Contact information: [address] [place] [address] [phone]        Alda Berthold, DO. Schedule an appointment as soon as possible for a visit in 1 week(s).  Specialty:  Neurology Why:  Hospital follow up Contact information: [place] [address] [phone]            The results of significant diagnostics from this hospitalization (including imaging, microbiology, ancillary and laboratory) are listed below for reference.    Significant Diagnostic Studies: Mr Cervical Spine W Wo Contrast  Result Date: 11/20/2016 CLINICAL DATA:  Progressive weakness and numbness in the extremities associated with recent falls. Question demyelination. EXAM: MRI CERVICAL AND LUMBAR SPINE WITHOUT AND WITH CONTRAST TECHNIQUE: Multiplanar and multiecho pulse sequences of the cervical spine, to include the craniocervical junction and cervicothoracic junction, and lumbar spine, were obtained without and with intravenous contrast. CONTRAST:  22m  MULTIHANCE GADOBENATE DIMEGLUMINE 529 MG/ML IV SOLN COMPARISON:  Report of MRI brain 02/11/2002. The images are not currently available. FINDINGS: MRI CERVICAL SPINE FINDINGS Alignment: AP alignment is anatomic. There straightening of the normal cervical lordosis. Vertebrae: Endplate marrow changes are present at C5-6 and C6-7 most notably. Cord: Normal signal is present in cervical and upper thoracic spinal cord to the lowest imaged level, T2-3. Minimal hyperintensity on the sagittal T2 weighted images is not corroborated on the axial images and is likely artifact. There is no focal enhancement. Posterior Fossa, vertebral arteries, paraspinal tissues: The craniocervical junction is normal. The visualized intracranial contents are normal. Disc levels: C2-3:  Negative. C3-4: A broad-based disc protrusion is present. There is slight distortion of the ventral cord without abnormal signal. Mild foraminal narrowing bilaterally secondary to uncovertebral and facet disease. C4-5: A more profound broad-based disc osteophyte complex narrows the central canal to 7.4 mm. Moderate foraminal narrowing is worse on the right. Uncovertebral and facet disease is present. C5-6: A broad-based disc protrusion is present. There is a far left lateral component. The central canal is narrowed to 7 mm. Severe foraminal stenosis is present bilaterally. C6-7: Uncovertebral and facet disease contribute to moderate foraminal narrowing bilaterally. The central canal is patent. C7-T1:  Negative. No pathologic enhancement is present within the cervical spine. MRI LUMBAR SPINE FINDINGS Segmentation: 5 non rib-bearing lumbar type vertebral bodies are present. Alignment:  AP alignment is anatomic. Vertebrae: Marrow signal and vertebral body heights are normal. Small hemangiomas are present at L5, S1, and S3. Conus medullaris: Extends to the L2 level and appears normal. Paraspinal and other soft tissues: Limited imaging of the abdomen is unremarkable.  There is no significant adenopathy. Disc levels: Short pedicles are present throughout the lumbar spine. L1-2:  Negative. L2-3: Mild disc bulging is present without significant stenosis. L3-4: A broad-based disc bulge is present. This extends into the left neural foramen. No significant stenosis is present. L4-5: A mild broad-based disc protrusion is present. Short pedicles and facet hypertrophy contribute to mild subarticular narrowing bilaterally. Mild bilateral foraminal stenosis is evident. L5-S1: A shallow central disc protrusion is present. Facet hypertrophy contributes to mild left foraminal narrowing. The central canal is patent. IMPRESSION: 1. No focal cord signal abnormality or lesion. 2. Multilevel spondylosis of the cervical spine as described. 3. Moderate central and mild bilateral foraminal stenosis at C3-4. 4. Moderate central and bilateral foraminal narrowing at C4-5. 5. Moderate central and severe bilateral foraminal stenosis at C5-6. 6. Moderate foraminal narrowing bilaterally at C6-7 without significant central canal stenosis. 7. Short pedicles in the cervical and lumbar spine contribute to the stenosis. 8. Disc bulging at L2-3 and L3-4 without significant stenosis. 9. Disc bulging and facet hypertrophy at L4-5 with mild subarticular and foraminal stenosis bilaterally.  10. Mild left foraminal narrowing at L5-S1 secondary to asymmetric facet hypertrophy. Electronically Signed   By: San Morelle M.D.   On: 11/20/2016 17:10   Mr Lumbar Spine W Wo Contrast  Result Date: 11/20/2016 CLINICAL DATA:  Progressive weakness and numbness in the extremities associated with recent falls. Question demyelination. EXAM: MRI CERVICAL AND LUMBAR SPINE WITHOUT AND WITH CONTRAST TECHNIQUE: Multiplanar and multiecho pulse sequences of the cervical spine, to include the craniocervical junction and cervicothoracic junction, and lumbar spine, were obtained without and with intravenous contrast. CONTRAST:  65m  MULTIHANCE GADOBENATE DIMEGLUMINE 529 MG/ML IV SOLN COMPARISON:  Report of MRI brain 02/11/2002. The images are not currently available. FINDINGS: MRI CERVICAL SPINE FINDINGS Alignment: AP alignment is anatomic. There straightening of the normal cervical lordosis. Vertebrae: Endplate marrow changes are present at C5-6 and C6-7 most notably. Cord: Normal signal is present in cervical and upper thoracic spinal cord to the lowest imaged level, T2-3. Minimal hyperintensity on the sagittal T2 weighted images is not corroborated on the axial images and is likely artifact. There is no focal enhancement. Posterior Fossa, vertebral arteries, paraspinal tissues: The craniocervical junction is normal. The visualized intracranial contents are normal. Disc levels: C2-3:  Negative. C3-4: A broad-based disc protrusion is present. There is slight distortion of the ventral cord without abnormal signal. Mild foraminal narrowing bilaterally secondary to uncovertebral and facet disease. C4-5: A more profound broad-based disc osteophyte complex narrows the central canal to 7.4 mm. Moderate foraminal narrowing is worse on the right. Uncovertebral and facet disease is present. C5-6: A broad-based disc protrusion is present. There is a far left lateral component. The central canal is narrowed to 7 mm. Severe foraminal stenosis is present bilaterally. C6-7: Uncovertebral and facet disease contribute to moderate foraminal narrowing bilaterally. The central canal is patent. C7-T1:  Negative. No pathologic enhancement is present within the cervical spine. MRI LUMBAR SPINE FINDINGS Segmentation: 5 non rib-bearing lumbar type vertebral bodies are present. Alignment:  AP alignment is anatomic. Vertebrae: Marrow signal and vertebral body heights are normal. Small hemangiomas are present at L5, S1, and S3. Conus medullaris: Extends to the L2 level and appears normal. Paraspinal and other soft tissues: Limited imaging of the abdomen is unremarkable.  There is no significant adenopathy. Disc levels: Short pedicles are present throughout the lumbar spine. L1-2:  Negative. L2-3: Mild disc bulging is present without significant stenosis. L3-4: A broad-based disc bulge is present. This extends into the left neural foramen. No significant stenosis is present. L4-5: A mild broad-based disc protrusion is present. Short pedicles and facet hypertrophy contribute to mild subarticular narrowing bilaterally. Mild bilateral foraminal stenosis is evident. L5-S1: A shallow central disc protrusion is present. Facet hypertrophy contributes to mild left foraminal narrowing. The central canal is patent. IMPRESSION: 1. No focal cord signal abnormality or lesion. 2. Multilevel spondylosis of the cervical spine as described. 3. Moderate central and mild bilateral foraminal stenosis at C3-4. 4. Moderate central and bilateral foraminal narrowing at C4-5. 5. Moderate central and severe bilateral foraminal stenosis at C5-6. 6. Moderate foraminal narrowing bilaterally at C6-7 without significant central canal stenosis. 7. Short pedicles in the cervical and lumbar spine contribute to the stenosis. 8. Disc bulging at L2-3 and L3-4 without significant stenosis. 9. Disc bulging and facet hypertrophy at L4-5 with mild subarticular and foraminal stenosis bilaterally. 10. Mild left foraminal narrowing at L5-S1 secondary to asymmetric facet hypertrophy. Electronically Signed   By: CSan MorelleM.D.   On: 11/20/2016 17:10  Dg Lumbar Puncture Fluoro Guide  Result Date: 11/20/2016 CLINICAL DATA:  Progressive extremity weakness. 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 EXAM: DIAGNOSTIC LUMBAR PUNCTURE UNDER FLUOROSCOPIC GUIDANCE FLUOROSCOPY TIME:  Fluoroscopy Time: 6 seconds of intermittent low dose pulsed fluoro. Radiation Exposure Index (if provided by the fluoroscopic device): 9.8 mGy Number of Acquired Spot Images: 0 PROCEDURE: Informed consent was obtained from the patient prior to the  procedure, including potential complications of headache, allergy, and pain. With the patient prone, the lower back was prepped with Betadine after localizing an appropriate needle position site. 1% Lidocaine was used for local anesthesia. Lumbar puncture was performed at the L2-3 level on the left using a 20 gauge needle with return of clear, colorless CSF with an opening pressure of 14.5 cm water measured prone through the needle. After collection of a few ml of clear fluid, the CSF returned decreased. The needle was repositioned in an attempt to improve the CSF flow. This resulted in some bleeding into the needle and tubing. After additional repositioning, the CSF again cleared. 12 ml of CSF were obtained for laboratory studies. The patient tolerated the procedure well and there were no apparent complications. IMPRESSION: Lumbar puncture performed for CSF analysis. Normal opening pressure. Electronically Signed   By: Richardean Sale M.D.   On: 11/20/2016 15:16    Microbiology: Recent Results (from the past 240 hour(s))  Anaerobic culture     Status: None (Preliminary result)   Collection Time: 11/20/16  2:51 PM  Result Value Ref Range Status   Specimen Description CSF  Final   Special Requests NONE  Final   Culture   Final    NO ANAEROBES ISOLATED; CULTURE IN PROGRESS FOR 5 DAYS   Report Status PENDING  Incomplete  CSF culture     Status: None   Collection Time: 11/20/16  2:51 PM  Result Value Ref Range Status   Specimen Description CSF  Final   Special Requests NONE  Final   Gram Stain   Final    WBC PRESENT, PREDOMINANTLY MONONUCLEAR NO ORGANISMS SEEN CYTOSPIN SMEAR    Culture NO GROWTH 3 DAYS  Final   Report Status 11/23/2016 FINAL  Final     Labs: Basic Metabolic Panel:  Recent Labs Lab 11/20/16 1146 11/22/16 0551  NA 137 137  K 4.4 3.6  CL 104 106  CO2 26 23  GLUCOSE 230* 85  BUN 22* 18  CREATININE 0.90 0.82  CALCIUM 9.2 8.7*   Liver Function Tests: No results for  input(s): AST, ALT, ALKPHOS, BILITOT, PROT, ALBUMIN in the last 168 hours. No results for input(s): LIPASE, AMYLASE in the last 168 hours. No results for input(s): AMMONIA in the last 168 hours. CBC:  Recent Labs Lab 11/20/16 1146 11/22/16 0551  WBC 8.3 6.5  HGB 16.6 15.0  HCT 46.8 43.1  MCV 86.8 87.4  PLT 192 154   Cardiac Enzymes: No results for input(s): CKTOTAL, CKMB, CKMBINDEX, TROPONINI in the last 168 hours. BNP: BNP (last 3 results) No results for input(s): BNP in the last 8760 hours.  ProBNP (last 3 results) No results for input(s): PROBNP in the last 8760 hours.  CBG:  Recent Labs Lab 11/23/16 1634 11/23/16 2142 11/24/16 0215 11/24/16 0609 11/24/16 1134  GLUCAP 317* 203* 104* 129* 260*       Signed:  Cristal Ford  [facility] 11/24/2016, 12:15 PM

## 2016-11-24 NOTE — Progress Notes (Signed)
Discharge instructions reviewed with patient/family. All questions answered at this time. Note given to patient. Transport home by family.   Ave Filter, RN

## 2016-11-24 NOTE — Progress Notes (Signed)
S: Seen and examined. No acute issues. Headache with last IVIG infusion that responded well to Tylenol.   O: Vitals:   11/24/16 0100 11/24/16 0918  BP: 127/72 117/71  Pulse: 71 78  Resp: 18 18  Temp: 99 F (37.2 C) 98.8 F (37.1 C)  SpO2: 94% 98%  Gen.: Well-developed Well-nourished in no acute distress HEENT: Normocephalic, atraumatic, moist oral mucous membranes Cardiovascular: S1-S2 heard, regular rate rhythm Respiratory: Normal work of breathing, chest clear to auscultation Extremities: No edema, warm and well-perfused  NEURO: Essentially unchanged from yesterday Mental Status: AA&Ox3  Language: speech is Clear. Naming, repetition, fluency, and comprehension intact. Cranial Nerves: PERRL 26mm/brisk. EOMI, visual fields full, no facial asymmetry, facial sensation intact, hearing intact, tongue/uvula/soft palate midline, normal sternocleidomastoid and trapezius muscle strength. No evidence of tongue atrophy or fibrillations Motor:  RightLeft Shoulder Abduction5/55/5 Elbow Flexion5/55/5 Elbow Extension5/55/5 Wrist Flexion4+/54+/5 Wrist Extension5/55/5 Interossei5/55/5  Hip Flexion4/54/5 Knee Flexion4/54/5 Knee Extension4/54/5 Ankle Dorsiflexion4/54/55 Ankle Plantarflexion4/54/5 Tone: is normal and bulk is normal Sensation- reduced vibration and light touch in a stocking pattern up to the ankles decreased sensation to pinprick and similarpatternon the  lower extremity as well as in glovelike pattern in the upper extremity. Coordination: FTN intact bilaterally, no ataxia in BLE. Gait- deferred DTRs: 2+ biceps bilaterally,2+ triceps bilaterally, 3+ knee jerks bilaterally,2+ ankle jerks.  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 Exam moderately improved from baseline.  Recs: Completed 5 days of IVIG-last dose today.  Acetaminophen when necessary for headache Continue aspirin 81 daily PT and OT as outpatient Follow-up with Dr. Posey Pronto. Decision on monthly IVIG based on outpatient testing and follow-up. I discussed this case with Dr. Posey Pronto over the phone yesterday. Plan relayed to Dr. Ree Kida.  Neurology will be available as needed. Please call with questions.  -- Amie Portland, MD [facility] [phone]  If 7pm to 7am, please call on call as listed on AMION.

## 2016-11-24 NOTE — Progress Notes (Signed)
Occupational Therapy Treatment Patient Details Name: Kenneth Pace MRN: [id number] DOB: 1950-04-25 Today's Date: 11/24/2016    History of present illness 66 year old man with DM, HLD, HTN, OSA on CPAP, with progressive paresthesias and weakness of hands and legs, in a descending pattern, where it started from his upper extremities and now involves his lower extremities, with intact reflexes and may be hyperreflexia at the knee, and nerve conduction studies suggestive of a demyelinating pattern along with secondary axonal loss in the upper extremities. 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 Pt continues to present with decreased grasp strength, pinch strength, and FM skills in Bil hands (R>L). Provided education and handout on theraputty and FM exercises to increase grasp/pinch strength and FM skills. Pt demonstrating understanding and perform FM and theraputty exercises with handout and VCs. Will continues to follow acutely to facilitate safe dc. Continues to recommend pt follow up at neuro outpatient to optimize his safety and independence with ADLs and IADLs.    Follow Up Recommendations  Outpatient OT (Neuro-outpatient OT)    Equipment Recommendations  Tub/shower seat;3 in 1 bedside commode    Recommendations for Other Services      Precautions / Restrictions Precautions Precautions: Fall Restrictions Weight Bearing Restrictions: No       Mobility Bed Mobility               General bed mobility comments: Pt on couch upon arrival  Transfers                      Balance Overall balance assessment: Needs assistance Sitting-balance support: Feet supported Sitting balance-Leahy Scale: Normal   Postural control: Posterior lean Standing balance support: Bilateral upper extremity supported;During functional activity Standing balance-Leahy Scale: Fair                              ADL either performed or assessed with clinical judgement   ADL Overall ADL's : Needs assistance/impaired                                       General ADL Comments: Focused session on FM excercises and theraputty     Vision   Vision Assessment?: No apparent visual deficits   Perception     Praxis      Cognition Arousal/Alertness: Awake/alert Behavior During Therapy: WFL for tasks assessed/performed Overall Cognitive Status: Within Functional Limits for tasks assessed                                          Exercises Exercises: Hand exercises;Other exercises Hand Exercises Opposition: 5 reps;Both;Seated Other Exercises Other Exercises: Provided pt with theraputty (yellow) and handout. Pt rolling putty in ball, flattening, rolling into log, and then pinch.  Other Exercises: Pt hiding objects in putty and then pulling them out. Other Exercises: Provided handout and education on FM skills including stacking, stringing beads, in hand manipulation, finger translation, lifting fingers, etc   Shoulder Instructions       General Comments      Pertinent Vitals/ Pain       Pain Assessment: No/denies pain  Home Living  Prior Functioning/Environment              Frequency  Min 2X/week        Progress Toward Goals  OT Goals(current goals can now be found in the care plan section)  Progress towards OT goals: Progressing toward goals  Acute Rehab OT Goals Patient Stated Goal: Get stronger OT Goal Formulation: With patient Time For Goal Achievement: 12/07/16 Potential to Achieve Goals: Good ADL Goals Pt Will Perform Eating: with modified independence;sitting;with adaptive utensils Pt Will Perform Grooming: with modified independence;standing Pt Will Perform Upper Body Dressing: with modified independence;sitting Pt Will Perform Lower Body  Dressing: with modified independence;sit to/from stand Pt Will Transfer to Toilet: with modified independence;ambulating;regular height toilet Pt Will Perform Toileting - Clothing Manipulation and hygiene: with modified independence;sit to/from stand Pt/caregiver will Perform Home Exercise Program: Increased strength;Both right and left upper extremity;With Supervision;With written HEP provided (grasp strength and fine motor coordination)  Plan Discharge plan remains appropriate    Co-evaluation                 AM-PAC PT "6 Clicks" Daily Activity     Outcome Measure   Help from another person eating meals?: A Little Help from another person taking care of personal grooming?: A Little Help from another person toileting, which includes using toliet, bedpan, or urinal?: A Little Help from another person bathing (including washing, rinsing, drying)?: A Little Help from another person to put on and taking off regular upper body clothing?: A Little Help from another person to put on and taking off regular lower body clothing?: A Little 6 Click Score: 18    End of Session    OT Visit Diagnosis: Muscle weakness (generalized) (M62.81)   Activity Tolerance Patient tolerated treatment well   Patient Left in chair;with call bell/phone within reach   Nurse Communication Mobility status        Time: 0093-8182 OT Time Calculation (min): 22 min  Charges: OT General Charges $OT Visit: 1 Visit OT Treatments $Therapeutic Activity: 8-22 mins  [place], OTR/L Acute Rehab Pager: [phone] Office: [place] 11/24/2016, 10:30 AM

## 2016-11-24 NOTE — Progress Notes (Signed)
11/23/16 1500  PT Visit Information  Last PT Received On 11/23/16  Assistance Needed +1  History of Present Illness 66 year old man with DM, HLD, HTN, OSA on CPAP, with progressive paresthesias and weakness of hands and legs, in a descending pattern, where it started from his upper extremities and now involves his lower extremities, with intact reflexes and may be hyperreflexia at the knee, and nerve conduction studies suggestive of a demyelinating pattern along with secondary axonal loss in the upper extremities. 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   Subjective Data  Subjective pt reports he was tired after yesterday  Patient Stated Goal Get stronger  Precautions  Precautions Fall  Restrictions  Weight Bearing Restrictions No  Pain Assessment  Pain Assessment No/denies pain  Cognition  Arousal/Alertness Awake/alert  Behavior During Therapy WFL for tasks assessed/performed  Overall Cognitive Status Within Functional Limits for tasks assessed  Bed Mobility  Overal bed mobility Modified Independent  General bed mobility comments OOB in chair on my arrival   Transfers  Overall transfer level Needs assistance  Equipment used Rolling walker (2 wheeled)  Transfers Sit to/from Stand  Sit to Stand Supervision  General transfer comment S on RW due to fatigue from stairs and longer walk per wife  Ambulation/Gait  Ambulation/Gait assistance Min guard  Ambulation Distance (Feet) 200 Feet  Assistive device Rolling walker (2 wheeled)  Gait Pattern/deviations Step-through pattern;Decreased dorsiflexion - left;Decreased stance time - left;Narrow base of support;Ataxic (crosses over steps nearly at times)  General Gait Details beginning of buckling on LLE with RW gait after a time of half way point on trip   Gait velocity reduced  Gait velocity interpretation Below normal speed for age/gender  Balance  Overall balance assessment Needs assistance   Sitting-balance support Feet supported  Sitting balance-Leahy Scale Normal  Postural control Posterior lean  Standing balance support Bilateral upper extremity supported;During functional activity  Standing balance-Leahy Scale Fair  Standing balance comment more support than last tx due to fatigue from gait two days ago per wife  Exercises  Exercises General Lower Extremity  General Exercises - Lower Extremity  Ankle Circles/Pumps AROM;Both;5 reps  Quad Sets AROM;Both;15 reps  Long Arc Quad Strengthening;Both;15 reps  Heel Slides Strengthening;Both;15 reps  Hip ABduction/ADduction Strengthening;Both;15 reps  PT - End of Session  Equipment Utilized During Treatment Gait belt  Activity Tolerance Patient tolerated treatment well  Patient left in chair;with call bell/phone within reach;with family/visitor present  Nurse Communication Mobility status  PT - Assessment/Plan  PT Plan Current plan remains appropriate  PT Visit Diagnosis Unsteadiness on feet (R26.81);Other symptoms and signs involving the nervous system (R29.898);History of falling (Z91.81)  PT Frequency (ACUTE ONLY) Min 4X/week  Follow Up Recommendations Supervision for mobility/OOB;Outpatient PT  PT equipment Rolling walker with 5" wheels  AM-PAC PT "6 Clicks" Daily Activity Outcome Measure  Difficulty turning over in bed (including adjusting bedclothes, sheets and blankets)? 4  Difficulty moving from lying on back to sitting on the side of the bed?  3  Difficulty sitting down on and standing up from a chair with arms (e.g., wheelchair, bedside commode, etc,.)? 3  Help needed moving to and from a bed to chair (including a wheelchair)? 3  Help needed walking in hospital room? 4  Help needed climbing 3-5 steps with a railing?  3  6 Click Score 20  Mobility G Code  CJ  PT Goal Progression  Progress towards PT goals Progressing toward goals  PT Time Calculation  PT  Start Time (ACUTE ONLY) 1418  PT Stop Time (ACUTE ONLY)  1455  PT Time Calculation (min) (ACUTE ONLY) 37 min  PT G-Codes **NOT FOR INPATIENT CLASS**  Functional Assessment Tool Used AM-PAC 6 Clicks Basic Mobility;Clinical judgement  PT General Charges  $$ ACUTE PT VISIT 1 Visit  PT Treatments  $Gait Training 8-22 mins  $Therapeutic Exercise 8-22 mins   Mee Hives, PT MS Acute Rehab Dept. Number: [place]

## 2016-11-24 NOTE — Discharge Instructions (Signed)
Guillain-Barre Syndrome Guillain-Barre syndrome (GBS) is a rare disorder in which your bodys defense (immune) system attacks your nervous system. GBS is a kind of autoimmune disorder. GBS is not contagious and most people with GBS recover within a few months. However, some people may still have some weakness after a few years. What are the causes? The exact cause of GBS syndrome is not known. The disorder usually develops a few days or weeks after a viral infection, such as a stomach (gastrointestinal) or breathing (respiratory) infection. Sometimes surgery or vaccinations will trigger the syndrome. What are the signs or symptoms? The most common signs and symptoms of GBS are tingling, numbness, and weakness in your lower legs. You may have more symptoms in other areas of your body after a few weeks. Signs and symptoms may eventually include:  Muscle weakness that spreads from your legs to your arms and trunk.  Difficulty breathing.  Total loss of muscle use.  Facial weakness.  Double vision.  Trouble swallowing.  Slurred speech.  Aching or burning pain.  Rapid breathing and heart rate.  Flushing or cold and clammy skin.  Dizziness when standing.  Trouble passing urine or having bowel movements.  How is this diagnosed? Your health care provider will do a physical exam to diagnose GBS. You may also have tests to confirm GBS. These may include:  A nerve conduction velocity (NCV) test to check for slowing of nerve signals.  A spinal tap to check for protein in the fluid around the spinal cord.  How is this treated? Treatment focuses on relieving symptoms and speeding up recovery. The most important part of treatment is to keep your body functioning while your nervous system recovers. Severe cases of GBS may require hospitalization. Possible treatments include:  Plasmapheresis.This is a type of blood transfusion. It removes immune system cells that are attacking your nervous  system.  Intravenous injections of special proteins (immunoglobulins or antibodies). These proteins may slow down the immune system's attack on peripheral nerves.  Medicines to control the symptoms of GBS.  Follow these instructions at home:  Physical therapy may be recommended by your health care provider. Follow your exercises as directed by your physical therapist.  Make sure you have the support you need.  Keep all follow-up visits as directed by your health care provider. This is important.  Take medicines only as directed by your health care provider. Contact a health care provider if:  You have new symptoms or your symptoms get worse.  You do not feel safe or supported at home. Get help right away if:  You have trouble breathing.  You have trouble swallowing.  You choke after eating or drinking.  You cannot move.  You pass out. This information is not intended to replace advice given to you by your health care provider. Make sure you discuss any questions you have with your health care provider. Document Released: 01/23/2002 Document Revised: 07/11/2015 Document Reviewed: 04/05/2013 Elsevier Interactive Patient Education  2018 [employer]. Paresthesia Paresthesia is an abnormal burning or prickling sensation. This sensation is generally felt in the hands, arms, legs, or feet. However, it may occur in any part of the body. Usually, it is not painful. The feeling may be described as:  Tingling or numbness.  Pins and needles.  Skin crawling.  Buzzing.  Limbs falling asleep.  Itching.  Most people experience temporary (transient) paresthesia at some time in their lives. Paresthesia may occur when you breathe too quickly (hyperventilation). It can also  occur without any apparent cause. Commonly, paresthesia occurs when pressure is placed on a nerve. The sensation quickly goes away after the pressure is removed. For some people, however, paresthesia is a  long-lasting (chronic) condition that is caused by an underlying disorder. If you continue to have paresthesia, you may need further medical evaluation. Follow these instructions at home: Watch your condition for any changes. Taking the following actions may help to lessen any discomfort that you are feeling:  Avoid drinking alcohol.  Try acupuncture or massage to help relieve your symptoms.  Keep all follow-up visits as directed by your health care provider. This is important.  Contact a health care provider if:  You continue to have episodes of paresthesia.  Your burning or prickling feeling gets worse when you walk.  You have pain, cramps, or dizziness.  You develop a rash. Get help right away if:  You feel weak.  You have trouble walking or moving.  You have problems with speech, understanding, or vision.  You feel confused.  You cannot control your bladder or bowel movements.  You have numbness after an injury.  You faint. This information is not intended to replace advice given to you by your health care provider. Make sure you discuss any questions you have with your health care provider. Document Released: 01/23/2002 Document Revised: 07/11/2015 Document Reviewed: 01/29/2014 Elsevier Interactive Patient Education  [employer].

## 2016-11-24 NOTE — Care Management Note (Signed)
Case Management Note  Patient Details  Name: Kenneth Pace MRN: [id number] Date of Birth: 1950/03/03  Subjective/Objective:                    Action/Plan: Pt discharging home with self care. CM consulted for outpatient therapy. CM spoke to the patient and his wife (over the phone) and they would like to go to [facility] in [place]. CM called [facility] and they provide both therapies. Order faxed and given to the patient.  Pt with orders for walker, 3 in1, and shower chair. Pt refusing 3 in1. His insurance will not cover the shower chair. He states he will obtain this DME as outpatient. Jermaine with [facility] DME will deliver the walker to the room. Wife to pick patient up after 6 pm.   Expected Discharge Date:  11/24/16               Expected Discharge Plan:  OP Rehab  In-House Referral:     Discharge planning Services  CM Consult  Post Acute Care Choice:  Durable Medical Equipment Choice offered to:  Patient, Spouse  DME Arranged:  Walker rolling (pt refused 3 in 1) DME Agency:  [place]:    [place] Agency:     Status of Service:  Completed, signed off  If discussed at [place] of Stay Meetings, dates discussed:    Additional Comments:  Pollie Friar, RN 11/24/2016, 12:56 PM

## 2016-11-25 ENCOUNTER — Telehealth: Payer: Self-pay | Admitting: Neurology

## 2016-11-25 DIAGNOSIS — M7661 Achilles tendinitis, right leg: Secondary | ICD-10-CM | POA: Diagnosis not present

## 2016-11-25 DIAGNOSIS — M7662 Achilles tendinitis, left leg: Secondary | ICD-10-CM | POA: Diagnosis not present

## 2016-11-25 LAB — OLIGOCLONAL BANDS, CSF + SERM

## 2016-11-25 NOTE — Telephone Encounter (Signed)
Pt's wife called and said Pt was in the hospital and had 5 days of IBIG and needs to see Dr Posey Pronto within 7-10 days and no later, please advise

## 2016-11-26 ENCOUNTER — Telehealth: Payer: Self-pay | Admitting: *Deleted

## 2016-11-26 LAB — ANAEROBIC CULTURE

## 2016-11-26 NOTE — Telephone Encounter (Signed)
There is a f/u open tomorrow afternoon or we will get them in next week and will call them with appt time and date.

## 2016-11-26 NOTE — Telephone Encounter (Signed)
Pt's wife called again and is waiting for a call back because she wants pt to be seen by Dr Posey Pronto soon

## 2016-11-26 NOTE — Telephone Encounter (Signed)
Please advise 

## 2016-11-26 NOTE — Telephone Encounter (Signed)
Transition Care Management Follow-up Telephone Call    Date discharged? 11/24/2016  How have you been since you were released from the hospital? Not much change  Any patient concerns? Legs are still weak and imbalance   Items Reviewed:  Medications reviewed: yes, stopped aspirin  Allergies reviewed: yes  Dietary changes reviewed: yes  Referrals reviewed: yes   Functional Questionnaire:  Independent - I Dependent - D    Activities of Daily Living (ADLs):    Personal hygiene - I Dressing - I Eating - I Maintaining continence - I Transferring - I  Independent Activities of Daily Living (iADLs): Basic communication skills - I Transportation - Not driving Meal preparation  - I Shopping - I Housework - I Managing medications - I Managing personal finances - I   Confirmed importance and date/time of follow-up visits scheduled yes  Provider Appointment booked with Dr. Posey Pronto on 10/12 at 3pm  Confirmed with patient if condition begins to worsen call PCP or go to the ER.  Patient was given the office number and encouraged to call back with question or concerns: Yes  Phone call and information taken by Su Hoff, RN

## 2016-11-27 ENCOUNTER — Ambulatory Visit (INDEPENDENT_AMBULATORY_CARE_PROVIDER_SITE_OTHER): Payer: BLUE CROSS/BLUE SHIELD | Admitting: Neurology

## 2016-11-27 ENCOUNTER — Encounter: Payer: Self-pay | Admitting: Neurology

## 2016-11-27 VITALS — BP 118/68 | HR 96 | Ht 72.0 in | Wt 230.0 lb

## 2016-11-27 DIAGNOSIS — G378 Other specified demyelinating diseases of central nervous system: Secondary | ICD-10-CM | POA: Diagnosis not present

## 2016-11-27 DIAGNOSIS — M7662 Achilles tendinitis, left leg: Secondary | ICD-10-CM | POA: Diagnosis not present

## 2016-11-27 DIAGNOSIS — M7661 Achilles tendinitis, right leg: Secondary | ICD-10-CM | POA: Diagnosis not present

## 2016-11-27 DIAGNOSIS — G610 Guillain-Barre syndrome: Secondary | ICD-10-CM

## 2016-11-27 NOTE — Patient Instructions (Addendum)
Continue your physical and occupation therapy  Return to clinic in 6-8 weeks

## 2016-11-27 NOTE — Progress Notes (Signed)
Follow-up Visit   Date: 11/27/16   Kenneth Pace MRN: [id number] DOB: 11/23/1950   Interim History: Kenneth Pace is a 66 y.o. right-handed Caucasian male with insulin-dependent diabetes mellitus, hypertension, OSA on CPAP, hyperlipidemia, and anxiety returning to the clinic for follow-up of transition care management.  The patient was accompanied to the clinic by wife who also provides collateral information.    History of present illness: Around late August, he started having electrical impulses of the arms, which started in the neck and radiates into the palm.  He also recalls having numbness of the arms.  He suffered a fall prior to this so he felt the he may have injured his neck.  A few weeks later, he also noticed weakness of the hands and unable to type at work.  In mid-September, he began having gait imbalance and started falling frequently. Since then, he has numbness/tingling of the feet and intermittent sharp pain radiating from his back into his mid-chest.  He has weakness of the both legs and has difficulty standing, climbing steps, and walking.  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 He does not have any difficulty swallowing, talking, or shortness of breath.   He initially thought his leg symptoms were stemming from his right knee pain and he was already scheduled to have R TKA on 9/27 which completed alleviated his knee pain, but his leg weakness and falls has worsened.   He denies any preceding illness, insect bites, and travel.   UPDATE 11/27/2016:  He is here for follow-up from his hospital stay from 10/5 - 10/9.  His testing showed albuminocytologic disocciation with CSF protein 81 and normal cell count.   He completed 5-days IVIG with the last dose on 10/9.  He feels that he made some improvement after his first  dose of IVIG, but since feels weaker in his arms and legs. He is walking with a rigid walker and has not suffered any falls over the past week.  He is going to out-patient physical therapy and completed 2-days.  He has a lot of muscle soreness following therapy sessions.  He start occupational therapy next week.    Medications:  Current Outpatient Prescriptions on File Prior to Visit  Medication Sig Dispense Refill  . acetaminophen (TYLENOL) 325 MG tablet Take 2 tablets (650 mg total) by mouth every 6 (six) hours as needed for mild pain or headache.    . busPIRone (BUSPAR) 30 MG tablet TAKE ONE-HALF (1/2) TABLET (15 MG) TWICE A DAY (Patient taking differently: 30MG BY MOUTH AT BEDTIME) 180 tablet 1  . co-enzyme Q-10 30 MG capsule Take 30 mg by mouth daily.      . Continuous Glucose Monitor Sup (ENLITE GLUCOSE SENSOR) MISC 1 Device by Does not apply route every 3 (three) days. 30 each 3  . Cranberry 400 MG TABS Take 2 each by mouth daily.      . Cyanocobalamin (VITAMIN B-12 CR PO) Take 1 each by mouth daily.      Marland Kitchen ezetimibe (ZETIA) 10 MG tablet TAKE 1 TABLET DAILY 90 tablet 3  . finasteride (PROSCAR) 5 MG tablet Take 5 mg by mouth daily.     Marland Kitchen glucagon 1 MG injection Inject 1 mg into the vein once as needed. 1 each 12  . glucose blood (BAYER CONTOUR NEXT TEST) test strip Use to check  blood sugar 5 times per day dx 250.01, 500 each 3  . HUMALOG 100 UNIT/ML injection INJECT 120 UNITS DAILY IN PUMP 100 mL 11  . Insulin Infusion Pump Supplies (PARADIGM RESERVOIR 3ML) MISC 1 Device by Does not apply route every 3 (three) days. 30 each 3  . Insulin Infusion Pump Supplies (QUICK-SET INFUSION 43" 9MM) MISC 1 Device by Does not apply route every 3 (three) days. 30 each 3  . Lancets Misc. (ACCU-CHEK MULTICLIX LANCET DEV) KIT 1 Device by Other route 5 (five) times daily as needed. 5/day dx 250.01 450 each 3  . lisinopril-hydrochlorothiazide (PRINZIDE,ZESTORETIC) 10-12.5 MG tablet TAKE 1 TABLET DAILY 90  tablet 1  . Misc. Devices MISC IV 3000 infusion set cover.  Change every 3 days 30 each 3  . Multiple Vitamins-Minerals (MULTIVITAMIN,TX-MINERALS) tablet Take 1 tablet by mouth daily.      . rosuvastatin (CRESTOR) 40 MG tablet TAKE 1 TABLET DAILY 90 tablet 3   No current facility-administered medications on file prior to visit.     Allergies: No Known Allergies  Review of Systems:  CONSTITUTIONAL: No fevers, chills, night sweats, or weight loss.  EYES: No visual changes or eye pain ENT: No hearing changes.  No history of nose bleeds.   RESPIRATORY: No cough, wheezing and shortness of breath.   CARDIOVASCULAR: Negative for chest pain, and palpitations.   GI: Negative for abdominal discomfort, blood in stools or black stools.  No recent change in bowel habits.   GU:  No history of incontinence.   MUSCLOSKELETAL: No history of joint pain or swelling.  No myalgias.   SKIN: Negative for lesions, rash, and itching.   ENDOCRINE: Negative for cold or heat intolerance, polydipsia or goiter.   PSYCH:  No depression or anxiety symptoms.   NEURO: As Above.   Vital Signs:  BP 118/68   Pulse 96   Ht 6' (1.829 m)   Wt 230 lb (104.3 kg)   SpO2 93%   BMI 31.19 kg/m   General: Well appearing, using walker   Neurological Exam: MENTAL STATUS including orientation to time, place, person, recent and remote memory, attention span and concentration, language, and fund of knowledge is normal.  Speech is not dysarthric.  CRANIAL NERVES:  Pupils equal round and reactive to light.  Normal conjugate, extra-ocular eye movements in all directions of gaze.  No ptosis. Normal facial sensation.  Face is symmetric. Palate elevates symmetrically.  Tongue is midline.   MOTOR:  No atrophy, fasciculations or abnormal movements.  No pronator drift.  Tone is normal.    Right Upper Extremity:    Left Upper Extremity:    Deltoid  5/5   Deltoid  5/5   Biceps  5/5   Biceps  5/5   Triceps  5/5   Triceps  5/5     Wrist extensors  5/5   Wrist extensors  5/5   Wrist flexors  5/5   Wrist flexors  5/5   Finger extensors * 4+/5   Finger extensors  4+/5   Finger flexors * 5-/5   Finger flexors * 5-/5   Dorsal interossei  4/5   Dorsal interossei  4+/5   Abductor pollicis * 5-/5   Abductor pollicis * 5-/5   Tone (Ashworth scale)  0  Tone (Ashworth scale)  0  *improved Right Lower Extremity:    Left Lower Extremity:    Hip flexors * 4/5   Hip flexors * 4/5   Hip extensors  5/5  Hip extensors  5/5   Adductor 4/5  Adductor 4/5  Abductor 5/5  Abductor 5/5  Knee flexors * 4/5   Knee flexors * 4/5   Knee extensors  5-/5   Knee extensors  5-/5   Dorsiflexors  5-/5   Dorsiflexors  5-/5   Plantarflexors  5-/5   Plantarflexors  5-/5   Toe extensors  5-/5   Toe extensors  5-/5   Toe flexors  5-/5   Toe flexors  5-/5   Tone (Ashworth scale)  0  Tone (Ashworth scale)  0   MSRs:  Right                                                                 Left brachioradialis 2+  brachioradialis 2+  biceps 2+  biceps 2+  triceps 2+  triceps 2+  patellar 3+  patellar 3+  ankle jerk 2+  ankle jerk 2+  Hoffman no  Hoffman no  plantar response down  plantar response down   SENSORY:  Gradient pattern of sensory loss involving the arms distal to mid-forearm and legs, distal to knees to pin prick, temperatures, and vibration which is absent at the ankles.   COORDINATION/GAIT:  Gait is wide-based, assisted with walker, mild dragging of both feet due to reduced hip flexion  Data: MRI cervical and lumbar spine 11/20/2016: 1. No focal cord signal abnormality or lesion. 2. Multilevel spondylosis of the cervical spine as described. 3. Moderate central and mild bilateral foraminal stenosis at C3-4. 4. Moderate central and bilateral foraminal narrowing at C4-5. 5. Moderate central and severe bilateral foraminal stenosis at C5-6. 6. Moderate foraminal narrowing bilaterally at C6-7 without significant central canal  stenosis. 7. Short pedicles in the cervical and lumbar spine contribute to the stenosis. 8. Disc bulging at L2-3 and L3-4 without significant stenosis. 9. Disc bulging and facet hypertrophy at L4-5 with mild subarticular and foraminal stenosis bilaterally. 10. Mild left foraminal narrowing at L5-S1 secondary to asymmetric facet hypertrophy.  CSF 11/20/2016:  R1 W3 P81* G102*  No OCB, VDRL neg Labs 11/20/2016:  SPEP with IFE no M protein, 486, TSH 1.051  Lab Results  Component Value Date   HGBA1C 7.3 (H) 11/20/2016    IMPRESSION/PLAN: Mr. Breck is a 66 year-old gentleman here for follow-up of his subacute inflammatory polyradiculoneuropathy.  With symptoms ongoing about 6 weeks, he does not meet definition of CIDP, however, his NCS/EMG and CSF was consistent with a polyradiculoneuropathy. In additional to albuminocytologic dissociation, his CSF glucose was elevated suggesting that diabetes may also be playing a role causing his neuropathy.  Since receiving IVIG, he has improved hand strength and he is able to resist with hip flexion, whereas he was barely antigravity last week.  If he transitions into CIDP, he may need monthly IVIG.  Ideally, this could AIDP with him having late nadir at 4-5 weeks and now will steadily recover.  I reassured the patient that there has been some improvement but it is too early to determine long term prognosis, as the benefit of IVIG can take several weeks to appreciate.  At this time, he needs aggressive occupational and physical therapy.   I will see him back in 6-8 weeks.    The duration of this appointment visit  was 30 minutes of face-to-face time with the patient.  Greater than 50% of this time was spent in counseling, explanation of diagnosis, planning of further management, and coordination of care.   Thank you for allowing me to participate in patient's care.  If I can answer any additional questions, I would be pleased to do so.     Sincerely,    Akshath Mccarey K. Posey Pronto, DO

## 2016-11-30 ENCOUNTER — Ambulatory Visit: Payer: BLUE CROSS/BLUE SHIELD | Admitting: Neurology

## 2016-11-30 DIAGNOSIS — M7662 Achilles tendinitis, left leg: Secondary | ICD-10-CM | POA: Diagnosis not present

## 2016-11-30 DIAGNOSIS — M7661 Achilles tendinitis, right leg: Secondary | ICD-10-CM | POA: Diagnosis not present

## 2016-12-02 DIAGNOSIS — M7662 Achilles tendinitis, left leg: Secondary | ICD-10-CM | POA: Diagnosis not present

## 2016-12-02 DIAGNOSIS — M7661 Achilles tendinitis, right leg: Secondary | ICD-10-CM | POA: Diagnosis not present

## 2016-12-03 DIAGNOSIS — M7661 Achilles tendinitis, right leg: Secondary | ICD-10-CM | POA: Diagnosis not present

## 2016-12-03 DIAGNOSIS — M7662 Achilles tendinitis, left leg: Secondary | ICD-10-CM | POA: Diagnosis not present

## 2016-12-07 DIAGNOSIS — M7661 Achilles tendinitis, right leg: Secondary | ICD-10-CM | POA: Diagnosis not present

## 2016-12-07 DIAGNOSIS — M7662 Achilles tendinitis, left leg: Secondary | ICD-10-CM | POA: Diagnosis not present

## 2016-12-07 DIAGNOSIS — R2689 Other abnormalities of gait and mobility: Secondary | ICD-10-CM | POA: Diagnosis not present

## 2016-12-07 DIAGNOSIS — R29898 Other symptoms and signs involving the musculoskeletal system: Secondary | ICD-10-CM | POA: Diagnosis not present

## 2016-12-09 DIAGNOSIS — M7661 Achilles tendinitis, right leg: Secondary | ICD-10-CM | POA: Diagnosis not present

## 2016-12-09 DIAGNOSIS — M7662 Achilles tendinitis, left leg: Secondary | ICD-10-CM | POA: Diagnosis not present

## 2016-12-11 DIAGNOSIS — M7661 Achilles tendinitis, right leg: Secondary | ICD-10-CM | POA: Diagnosis not present

## 2016-12-11 DIAGNOSIS — M7662 Achilles tendinitis, left leg: Secondary | ICD-10-CM | POA: Diagnosis not present

## 2016-12-14 ENCOUNTER — Ambulatory Visit: Payer: BLUE CROSS/BLUE SHIELD | Admitting: Endocrinology

## 2016-12-14 DIAGNOSIS — G4733 Obstructive sleep apnea (adult) (pediatric): Secondary | ICD-10-CM | POA: Diagnosis not present

## 2016-12-15 DIAGNOSIS — Z9181 History of falling: Secondary | ICD-10-CM | POA: Diagnosis not present

## 2016-12-15 DIAGNOSIS — R29898 Other symptoms and signs involving the musculoskeletal system: Secondary | ICD-10-CM | POA: Diagnosis not present

## 2016-12-15 DIAGNOSIS — R2689 Other abnormalities of gait and mobility: Secondary | ICD-10-CM | POA: Diagnosis not present

## 2016-12-17 DIAGNOSIS — R29898 Other symptoms and signs involving the musculoskeletal system: Secondary | ICD-10-CM | POA: Diagnosis not present

## 2016-12-17 DIAGNOSIS — Z9181 History of falling: Secondary | ICD-10-CM | POA: Diagnosis not present

## 2016-12-17 DIAGNOSIS — R2689 Other abnormalities of gait and mobility: Secondary | ICD-10-CM | POA: Diagnosis not present

## 2016-12-18 DIAGNOSIS — Z9181 History of falling: Secondary | ICD-10-CM | POA: Diagnosis not present

## 2016-12-18 DIAGNOSIS — R29898 Other symptoms and signs involving the musculoskeletal system: Secondary | ICD-10-CM | POA: Diagnosis not present

## 2016-12-18 DIAGNOSIS — R2689 Other abnormalities of gait and mobility: Secondary | ICD-10-CM | POA: Diagnosis not present

## 2016-12-28 DIAGNOSIS — Z9181 History of falling: Secondary | ICD-10-CM | POA: Diagnosis not present

## 2016-12-28 DIAGNOSIS — R2689 Other abnormalities of gait and mobility: Secondary | ICD-10-CM | POA: Diagnosis not present

## 2016-12-28 DIAGNOSIS — R29898 Other symptoms and signs involving the musculoskeletal system: Secondary | ICD-10-CM | POA: Diagnosis not present

## 2016-12-29 ENCOUNTER — Telehealth: Payer: Self-pay | Admitting: Neurology

## 2016-12-29 ENCOUNTER — Other Ambulatory Visit: Payer: Self-pay

## 2016-12-29 ENCOUNTER — Inpatient Hospital Stay (HOSPITAL_COMMUNITY)
Admission: EM | Admit: 2016-12-29 | Discharge: 2017-01-11 | DRG: 073 | Disposition: A | Discharge status: Rehabilitation Facility | Payer: BLUE CROSS/BLUE SHIELD | Attending: Family Medicine | Admitting: Family Medicine

## 2016-12-29 ENCOUNTER — Emergency Department (HOSPITAL_COMMUNITY): Payer: BLUE CROSS/BLUE SHIELD

## 2016-12-29 ENCOUNTER — Encounter (HOSPITAL_COMMUNITY): Payer: Self-pay | Admitting: Emergency Medicine

## 2016-12-29 DIAGNOSIS — R1310 Dysphagia, unspecified: Secondary | ICD-10-CM | POA: Diagnosis present

## 2016-12-29 DIAGNOSIS — F325 Major depressive disorder, single episode, in full remission: Secondary | ICD-10-CM | POA: Diagnosis not present

## 2016-12-29 DIAGNOSIS — E119 Type 2 diabetes mellitus without complications: Secondary | ICD-10-CM | POA: Diagnosis not present

## 2016-12-29 DIAGNOSIS — R339 Retention of urine, unspecified: Secondary | ICD-10-CM | POA: Diagnosis not present

## 2016-12-29 DIAGNOSIS — Z8261 Family history of arthritis: Secondary | ICD-10-CM

## 2016-12-29 DIAGNOSIS — K5900 Constipation, unspecified: Secondary | ICD-10-CM | POA: Diagnosis not present

## 2016-12-29 DIAGNOSIS — N319 Neuromuscular dysfunction of bladder, unspecified: Secondary | ICD-10-CM | POA: Diagnosis not present

## 2016-12-29 DIAGNOSIS — R278 Other lack of coordination: Secondary | ICD-10-CM | POA: Diagnosis not present

## 2016-12-29 DIAGNOSIS — G959 Disease of spinal cord, unspecified: Secondary | ICD-10-CM | POA: Diagnosis not present

## 2016-12-29 DIAGNOSIS — E1142 Type 2 diabetes mellitus with diabetic polyneuropathy: Secondary | ICD-10-CM | POA: Diagnosis not present

## 2016-12-29 DIAGNOSIS — E785 Hyperlipidemia, unspecified: Secondary | ICD-10-CM | POA: Diagnosis not present

## 2016-12-29 DIAGNOSIS — G610 Guillain-Barre syndrome: Secondary | ICD-10-CM

## 2016-12-29 DIAGNOSIS — E876 Hypokalemia: Secondary | ICD-10-CM | POA: Diagnosis present

## 2016-12-29 DIAGNOSIS — G8918 Other acute postprocedural pain: Secondary | ICD-10-CM | POA: Diagnosis not present

## 2016-12-29 DIAGNOSIS — M792 Neuralgia and neuritis, unspecified: Secondary | ICD-10-CM | POA: Diagnosis not present

## 2016-12-29 DIAGNOSIS — R338 Other retention of urine: Secondary | ICD-10-CM | POA: Diagnosis present

## 2016-12-29 DIAGNOSIS — Z9641 Presence of insulin pump (external) (internal): Secondary | ICD-10-CM | POA: Diagnosis present

## 2016-12-29 DIAGNOSIS — M62838 Other muscle spasm: Secondary | ICD-10-CM | POA: Diagnosis not present

## 2016-12-29 DIAGNOSIS — K4090 Unilateral inguinal hernia, without obstruction or gangrene, not specified as recurrent: Secondary | ICD-10-CM | POA: Diagnosis not present

## 2016-12-29 DIAGNOSIS — E1042 Type 1 diabetes mellitus with diabetic polyneuropathy: Secondary | ICD-10-CM | POA: Diagnosis not present

## 2016-12-29 DIAGNOSIS — I10 Essential (primary) hypertension: Secondary | ICD-10-CM | POA: Diagnosis not present

## 2016-12-29 DIAGNOSIS — M4802 Spinal stenosis, cervical region: Secondary | ICD-10-CM | POA: Diagnosis not present

## 2016-12-29 DIAGNOSIS — E871 Hypo-osmolality and hyponatremia: Secondary | ICD-10-CM | POA: Diagnosis not present

## 2016-12-29 DIAGNOSIS — M4712 Other spondylosis with myelopathy, cervical region: Secondary | ICD-10-CM | POA: Diagnosis present

## 2016-12-29 DIAGNOSIS — E1169 Type 2 diabetes mellitus with other specified complication: Secondary | ICD-10-CM | POA: Diagnosis not present

## 2016-12-29 DIAGNOSIS — N400 Enlarged prostate without lower urinary tract symptoms: Secondary | ICD-10-CM | POA: Diagnosis present

## 2016-12-29 DIAGNOSIS — F411 Generalized anxiety disorder: Secondary | ICD-10-CM | POA: Diagnosis not present

## 2016-12-29 DIAGNOSIS — Z452 Encounter for adjustment and management of vascular access device: Secondary | ICD-10-CM | POA: Diagnosis not present

## 2016-12-29 DIAGNOSIS — R7309 Other abnormal glucose: Secondary | ICD-10-CM | POA: Diagnosis not present

## 2016-12-29 DIAGNOSIS — G378 Other specified demyelinating diseases of central nervous system: Secondary | ICD-10-CM | POA: Diagnosis not present

## 2016-12-29 DIAGNOSIS — Z79899 Other long term (current) drug therapy: Secondary | ICD-10-CM | POA: Diagnosis not present

## 2016-12-29 DIAGNOSIS — G8250 Quadriplegia, unspecified: Secondary | ICD-10-CM | POA: Diagnosis present

## 2016-12-29 DIAGNOSIS — G6181 Chronic inflammatory demyelinating polyneuritis: Secondary | ICD-10-CM | POA: Diagnosis not present

## 2016-12-29 DIAGNOSIS — R0602 Shortness of breath: Secondary | ICD-10-CM | POA: Diagnosis not present

## 2016-12-29 DIAGNOSIS — E11649 Type 2 diabetes mellitus with hypoglycemia without coma: Secondary | ICD-10-CM | POA: Diagnosis not present

## 2016-12-29 DIAGNOSIS — R531 Weakness: Secondary | ICD-10-CM | POA: Diagnosis not present

## 2016-12-29 DIAGNOSIS — F329 Major depressive disorder, single episode, unspecified: Secondary | ICD-10-CM | POA: Diagnosis not present

## 2016-12-29 DIAGNOSIS — T380X5A Adverse effect of glucocorticoids and synthetic analogues, initial encounter: Secondary | ICD-10-CM | POA: Diagnosis not present

## 2016-12-29 DIAGNOSIS — Z794 Long term (current) use of insulin: Secondary | ICD-10-CM | POA: Diagnosis not present

## 2016-12-29 DIAGNOSIS — M5124 Other intervertebral disc displacement, thoracic region: Secondary | ICD-10-CM | POA: Diagnosis not present

## 2016-12-29 DIAGNOSIS — E669 Obesity, unspecified: Secondary | ICD-10-CM | POA: Diagnosis not present

## 2016-12-29 DIAGNOSIS — F32A Depression, unspecified: Secondary | ICD-10-CM | POA: Diagnosis present

## 2016-12-29 DIAGNOSIS — G4733 Obstructive sleep apnea (adult) (pediatric): Secondary | ICD-10-CM | POA: Diagnosis present

## 2016-12-29 DIAGNOSIS — Z833 Family history of diabetes mellitus: Secondary | ICD-10-CM

## 2016-12-29 DIAGNOSIS — Z8249 Family history of ischemic heart disease and other diseases of the circulatory system: Secondary | ICD-10-CM | POA: Diagnosis not present

## 2016-12-29 DIAGNOSIS — R29898 Other symptoms and signs involving the musculoskeletal system: Secondary | ICD-10-CM | POA: Diagnosis present

## 2016-12-29 DIAGNOSIS — R2689 Other abnormalities of gait and mobility: Secondary | ICD-10-CM | POA: Diagnosis not present

## 2016-12-29 DIAGNOSIS — E162 Hypoglycemia, unspecified: Secondary | ICD-10-CM | POA: Diagnosis not present

## 2016-12-29 DIAGNOSIS — E10649 Type 1 diabetes mellitus with hypoglycemia without coma: Secondary | ICD-10-CM | POA: Diagnosis not present

## 2016-12-29 DIAGNOSIS — K5901 Slow transit constipation: Secondary | ICD-10-CM | POA: Diagnosis not present

## 2016-12-29 DIAGNOSIS — R200 Anesthesia of skin: Secondary | ICD-10-CM | POA: Diagnosis not present

## 2016-12-29 DIAGNOSIS — N401 Enlarged prostate with lower urinary tract symptoms: Secondary | ICD-10-CM | POA: Diagnosis not present

## 2016-12-29 DIAGNOSIS — R739 Hyperglycemia, unspecified: Secondary | ICD-10-CM | POA: Diagnosis not present

## 2016-12-29 DIAGNOSIS — G629 Polyneuropathy, unspecified: Secondary | ICD-10-CM

## 2016-12-29 DIAGNOSIS — R253 Fasciculation: Secondary | ICD-10-CM | POA: Diagnosis present

## 2016-12-29 DIAGNOSIS — T50901D Poisoning by unspecified drugs, medicaments and biological substances, accidental (unintentional), subsequent encounter: Secondary | ICD-10-CM | POA: Diagnosis not present

## 2016-12-29 LAB — CBC
HCT: 47.2 % (ref 39.0–52.0)
Hemoglobin: 16.8 g/dL (ref 13.0–17.0)
MCH: 30.9 pg (ref 26.0–34.0)
MCHC: 35.6 g/dL (ref 30.0–36.0)
MCV: 86.8 fL (ref 78.0–100.0)
PLATELETS: 195 10*3/uL (ref 150–400)
RBC: 5.44 MIL/uL (ref 4.22–5.81)
RDW: 13.2 % (ref 11.5–15.5)
WBC: 8.5 10*3/uL (ref 4.0–10.5)

## 2016-12-29 LAB — BASIC METABOLIC PANEL
Anion gap: 9 (ref 5–15)
BUN: 21 mg/dL — AB (ref 6–20)
CALCIUM: 9.2 mg/dL (ref 8.9–10.3)
CHLORIDE: 101 mmol/L (ref 101–111)
CO2: 26 mmol/L (ref 22–32)
CREATININE: 0.9 mg/dL (ref 0.61–1.24)
GFR calc Af Amer: >60 mL/min (ref >60)
GFR calc non Af Amer: >60 mL/min (ref >60)
Glucose, Bld: 118 mg/dL — ABNORMAL HIGH (ref 65–99)
Potassium: 3.6 mmol/L (ref 3.5–5.1)
SODIUM: 136 mmol/L (ref 135–145)

## 2016-12-29 LAB — I-STAT TROPONIN, ED: Troponin i, poc: 0 ng/mL (ref 0.00–0.08)

## 2016-12-29 NOTE — ED Triage Notes (Signed)
Pt c/o of SOB since yesterday, denies any cp, no distress noticed. No fever, nausea or dizziness.

## 2016-12-29 NOTE — ED Notes (Signed)
Patient asking to speak to RN.  Went to speak to patient/wife.  States patient has Bobbye [place] and is having a flare up.  States he cannot stay in the chair any longer.  Charge RN made aware, will prioritize rooming.

## 2016-12-29 NOTE — Telephone Encounter (Signed)
Are we going to fill out short term for him?

## 2016-12-29 NOTE — Telephone Encounter (Signed)
Yes, I will complete it.  I don't believe I have had any FMLA paperwork on him yet.

## 2016-12-29 NOTE — Telephone Encounter (Signed)
Patient wife wants to talk to someone about short term disability she states that he was approved for only 2 weeks and that is not good for his DX. We should be getting some reports over today for the OT and PT. He is not doing good per spouse he has lost about 30% of function   please call her today

## 2016-12-30 ENCOUNTER — Telehealth: Payer: Self-pay | Admitting: Neurology

## 2016-12-30 ENCOUNTER — Inpatient Hospital Stay (HOSPITAL_COMMUNITY): Payer: BLUE CROSS/BLUE SHIELD

## 2016-12-30 DIAGNOSIS — N319 Neuromuscular dysfunction of bladder, unspecified: Secondary | ICD-10-CM | POA: Diagnosis not present

## 2016-12-30 DIAGNOSIS — K5900 Constipation, unspecified: Secondary | ICD-10-CM | POA: Diagnosis not present

## 2016-12-30 DIAGNOSIS — I10 Essential (primary) hypertension: Secondary | ICD-10-CM | POA: Diagnosis not present

## 2016-12-30 DIAGNOSIS — Z9641 Presence of insulin pump (external) (internal): Secondary | ICD-10-CM | POA: Diagnosis present

## 2016-12-30 DIAGNOSIS — E785 Hyperlipidemia, unspecified: Secondary | ICD-10-CM

## 2016-12-30 DIAGNOSIS — Z794 Long term (current) use of insulin: Secondary | ICD-10-CM | POA: Diagnosis not present

## 2016-12-30 DIAGNOSIS — G6181 Chronic inflammatory demyelinating polyneuritis: Secondary | ICD-10-CM | POA: Diagnosis present

## 2016-12-30 DIAGNOSIS — R29898 Other symptoms and signs involving the musculoskeletal system: Secondary | ICD-10-CM | POA: Diagnosis present

## 2016-12-30 DIAGNOSIS — N400 Enlarged prostate without lower urinary tract symptoms: Secondary | ICD-10-CM | POA: Diagnosis not present

## 2016-12-30 DIAGNOSIS — E162 Hypoglycemia, unspecified: Secondary | ICD-10-CM | POA: Diagnosis not present

## 2016-12-30 DIAGNOSIS — E11649 Type 2 diabetes mellitus with hypoglycemia without coma: Secondary | ICD-10-CM | POA: Diagnosis present

## 2016-12-30 DIAGNOSIS — E119 Type 2 diabetes mellitus without complications: Secondary | ICD-10-CM | POA: Diagnosis not present

## 2016-12-30 DIAGNOSIS — R2689 Other abnormalities of gait and mobility: Secondary | ICD-10-CM | POA: Diagnosis not present

## 2016-12-30 DIAGNOSIS — M792 Neuralgia and neuritis, unspecified: Secondary | ICD-10-CM | POA: Diagnosis not present

## 2016-12-30 DIAGNOSIS — F329 Major depressive disorder, single episode, unspecified: Secondary | ICD-10-CM | POA: Diagnosis present

## 2016-12-30 DIAGNOSIS — R338 Other retention of urine: Secondary | ICD-10-CM | POA: Diagnosis present

## 2016-12-30 DIAGNOSIS — G378 Other specified demyelinating diseases of central nervous system: Secondary | ICD-10-CM | POA: Diagnosis not present

## 2016-12-30 DIAGNOSIS — E871 Hypo-osmolality and hyponatremia: Secondary | ICD-10-CM | POA: Diagnosis not present

## 2016-12-30 DIAGNOSIS — N401 Enlarged prostate with lower urinary tract symptoms: Secondary | ICD-10-CM | POA: Diagnosis present

## 2016-12-30 DIAGNOSIS — M62838 Other muscle spasm: Secondary | ICD-10-CM | POA: Diagnosis not present

## 2016-12-30 DIAGNOSIS — Z8261 Family history of arthritis: Secondary | ICD-10-CM | POA: Diagnosis not present

## 2016-12-30 DIAGNOSIS — F32A Depression, unspecified: Secondary | ICD-10-CM | POA: Diagnosis present

## 2016-12-30 DIAGNOSIS — G610 Guillain-Barre syndrome: Secondary | ICD-10-CM | POA: Diagnosis present

## 2016-12-30 DIAGNOSIS — R253 Fasciculation: Secondary | ICD-10-CM | POA: Diagnosis present

## 2016-12-30 DIAGNOSIS — G8250 Quadriplegia, unspecified: Secondary | ICD-10-CM | POA: Diagnosis present

## 2016-12-30 DIAGNOSIS — E1142 Type 2 diabetes mellitus with diabetic polyneuropathy: Secondary | ICD-10-CM | POA: Diagnosis not present

## 2016-12-30 DIAGNOSIS — Z833 Family history of diabetes mellitus: Secondary | ICD-10-CM | POA: Diagnosis not present

## 2016-12-30 DIAGNOSIS — R7309 Other abnormal glucose: Secondary | ICD-10-CM | POA: Diagnosis not present

## 2016-12-30 DIAGNOSIS — G4733 Obstructive sleep apnea (adult) (pediatric): Secondary | ICD-10-CM | POA: Diagnosis present

## 2016-12-30 DIAGNOSIS — Z8249 Family history of ischemic heart disease and other diseases of the circulatory system: Secondary | ICD-10-CM | POA: Diagnosis not present

## 2016-12-30 DIAGNOSIS — M4712 Other spondylosis with myelopathy, cervical region: Secondary | ICD-10-CM | POA: Diagnosis not present

## 2016-12-30 DIAGNOSIS — Z79899 Other long term (current) drug therapy: Secondary | ICD-10-CM | POA: Diagnosis not present

## 2016-12-30 DIAGNOSIS — F325 Major depressive disorder, single episode, in full remission: Secondary | ICD-10-CM | POA: Diagnosis not present

## 2016-12-30 DIAGNOSIS — M4802 Spinal stenosis, cervical region: Secondary | ICD-10-CM | POA: Diagnosis present

## 2016-12-30 DIAGNOSIS — G629 Polyneuropathy, unspecified: Secondary | ICD-10-CM | POA: Diagnosis not present

## 2016-12-30 DIAGNOSIS — E876 Hypokalemia: Secondary | ICD-10-CM | POA: Diagnosis present

## 2016-12-30 DIAGNOSIS — R1310 Dysphagia, unspecified: Secondary | ICD-10-CM | POA: Diagnosis present

## 2016-12-30 DIAGNOSIS — R0602 Shortness of breath: Secondary | ICD-10-CM | POA: Diagnosis not present

## 2016-12-30 DIAGNOSIS — F411 Generalized anxiety disorder: Secondary | ICD-10-CM | POA: Diagnosis not present

## 2016-12-30 DIAGNOSIS — E1042 Type 1 diabetes mellitus with diabetic polyneuropathy: Secondary | ICD-10-CM | POA: Diagnosis not present

## 2016-12-30 DIAGNOSIS — E10649 Type 1 diabetes mellitus with hypoglycemia without coma: Secondary | ICD-10-CM | POA: Diagnosis not present

## 2016-12-30 LAB — BASIC METABOLIC PANEL
ANION GAP: 5 (ref 5–15)
BUN: 23 mg/dL — ABNORMAL HIGH (ref 6–20)
CALCIUM: 9 mg/dL (ref 8.9–10.3)
CO2: 27 mmol/L (ref 22–32)
Chloride: 105 mmol/L (ref 101–111)
Creatinine, Ser: 0.92 mg/dL (ref 0.61–1.24)
Glucose, Bld: 70 mg/dL (ref 65–99)
POTASSIUM: 3.3 mmol/L — AB (ref 3.5–5.1)
Sodium: 137 mmol/L (ref 135–145)

## 2016-12-30 LAB — CBG MONITORING, ED
GLUCOSE-CAPILLARY: 111 mg/dL — AB (ref 65–99)
Glucose-Capillary: 107 mg/dL — ABNORMAL HIGH (ref 65–99)

## 2016-12-30 LAB — PROTIME-INR
INR: 1.04
PROTHROMBIN TIME: 13.5 s (ref 11.4–15.2)

## 2016-12-30 LAB — CBC
HEMATOCRIT: 43.8 % (ref 39.0–52.0)
HEMOGLOBIN: 15.7 g/dL (ref 13.0–17.0)
MCH: 31 pg (ref 26.0–34.0)
MCHC: 35.8 g/dL (ref 30.0–36.0)
MCV: 86.6 fL (ref 78.0–100.0)
Platelets: 157 10*3/uL (ref 150–400)
RBC: 5.06 MIL/uL (ref 4.22–5.81)
RDW: 13.2 % (ref 11.5–15.5)
WBC: 8.7 10*3/uL (ref 4.0–10.5)

## 2016-12-30 LAB — GLUCOSE, CAPILLARY
GLUCOSE-CAPILLARY: 108 mg/dL — AB (ref 65–99)
GLUCOSE-CAPILLARY: 62 mg/dL — AB (ref 65–99)
Glucose-Capillary: 122 mg/dL — ABNORMAL HIGH (ref 65–99)

## 2016-12-30 LAB — CK: CK TOTAL: 223 U/L (ref 49–397)

## 2016-12-30 LAB — C-REACTIVE PROTEIN: CRP: <0.8 mg/dL (ref <1.0)

## 2016-12-30 LAB — MRSA PCR SCREENING: MRSA by PCR: NEGATIVE

## 2016-12-30 LAB — SEDIMENTATION RATE: Sed Rate: 10 mm/hr (ref 0–16)

## 2016-12-30 LAB — TSH: TSH: 3.426 u[IU]/mL (ref 0.350–4.500)

## 2016-12-30 LAB — VITAMIN B12: VITAMIN B 12: 392 pg/mL (ref 180–914)

## 2016-12-30 MED ORDER — INSULIN PUMP
Freq: Three times a day (TID) | SUBCUTANEOUS | Status: DC
  Administered 2016-12-30: 13:00:00 via SUBCUTANEOUS
  Administered 2016-12-30: 3.25 via SUBCUTANEOUS
  Administered 2016-12-31: 6.5 via SUBCUTANEOUS
  Administered 2016-12-31: 5 via SUBCUTANEOUS
  Administered 2016-12-31: 3.25 via SUBCUTANEOUS
  Administered 2017-01-01: 3 via SUBCUTANEOUS
  Administered 2017-01-01: 6 via SUBCUTANEOUS
  Administered 2017-01-02: 4.5 via SUBCUTANEOUS
  Administered 2017-01-02: 5 via SUBCUTANEOUS
  Administered 2017-01-03: 15 via SUBCUTANEOUS
  Administered 2017-01-04: 2.5 via SUBCUTANEOUS
  Administered 2017-01-04: 3.5 via SUBCUTANEOUS
  Administered 2017-01-05 (×2): 1 via SUBCUTANEOUS
  Administered 2017-01-08: 4 via SUBCUTANEOUS
  Administered 2017-01-11: 3 via SUBCUTANEOUS
  Filled 2016-12-30: qty 1

## 2016-12-30 MED ORDER — FINASTERIDE 5 MG PO TABS
5.0000 mg | ORAL_TABLET | Freq: Every day | ORAL | Status: DC
  Administered 2016-12-30 – 2017-01-10 (×12): 5 mg via ORAL
  Filled 2016-12-30 (×13): qty 1

## 2016-12-30 MED ORDER — ZOLPIDEM TARTRATE 5 MG PO TABS
5.0000 mg | ORAL_TABLET | Freq: Every evening | ORAL | Status: DC | PRN
  Administered 2017-01-01 – 2017-01-02 (×2): 5 mg via ORAL
  Filled 2016-12-30 (×2): qty 1

## 2016-12-30 MED ORDER — ADULT MULTIVITAMIN W/MINERALS CH
1.0000 | ORAL_TABLET | Freq: Every day | ORAL | Status: DC
  Administered 2016-12-30 – 2017-01-10 (×12): 1 via ORAL
  Filled 2016-12-30 (×13): qty 1

## 2016-12-30 MED ORDER — GLUCAGON (RDNA) 1 MG IJ KIT: 1.0000 mg | PACK | Freq: Once | INTRAMUSCULAR | Status: DC | PRN

## 2016-12-30 MED ORDER — ROSUVASTATIN CALCIUM 20 MG PO TABS
40.0000 mg | ORAL_TABLET | Freq: Every day | ORAL | Status: DC
Start: 2016-12-30 — End: 2016-12-31
  Administered 2016-12-30: 40 mg via ORAL
  Filled 2016-12-30: qty 1

## 2016-12-30 MED ORDER — EZETIMIBE 10 MG PO TABS
10.0000 mg | ORAL_TABLET | Freq: Every day | ORAL | Status: DC
  Administered 2016-12-30: 10 mg via ORAL
  Filled 2016-12-30: qty 1

## 2016-12-30 MED ORDER — POTASSIUM CHLORIDE CRYS ER 20 MEQ PO TBCR
40.0000 meq | EXTENDED_RELEASE_TABLET | Freq: Once | ORAL | Status: AC
  Administered 2016-12-30: 40 meq via ORAL
  Filled 2016-12-30: qty 2

## 2016-12-30 MED ORDER — HYDRALAZINE HCL 20 MG/ML IJ SOLN: 5.0000 mg | INTRAMUSCULAR | Status: DC | PRN

## 2016-12-30 MED ORDER — ACETAMINOPHEN 325 MG PO TABS
650.0000 mg | ORAL_TABLET | Freq: Four times a day (QID) | ORAL | Status: DC | PRN
  Administered 2016-12-30 – 2017-01-03 (×4): 650 mg via ORAL
  Filled 2016-12-30 (×4): qty 2

## 2016-12-30 MED ORDER — VITAMIN B-12 1000 MCG PO TABS
1000.0000 ug | ORAL_TABLET | Freq: Every day | ORAL | Status: DC
  Administered 2016-12-30 – 2017-01-10 (×12): 1000 ug via ORAL
  Filled 2016-12-30 (×14): qty 1

## 2016-12-30 MED ORDER — ONDANSETRON HCL 4 MG/2ML IJ SOLN
4.0000 mg | Freq: Four times a day (QID) | INTRAMUSCULAR | Status: DC | PRN
  Administered 2017-01-03: 4 mg via INTRAVENOUS
  Filled 2016-12-30: qty 2

## 2016-12-30 MED ORDER — BUSPIRONE HCL 15 MG PO TABS
30.0000 mg | ORAL_TABLET | Freq: Two times a day (BID) | ORAL | Status: DC
  Administered 2016-12-30 – 2017-01-03 (×6): 30 mg via ORAL
  Filled 2016-12-30 (×10): qty 2

## 2016-12-30 MED ORDER — HYDROCHLOROTHIAZIDE 12.5 MG PO CAPS
12.5000 mg | ORAL_CAPSULE | Freq: Every day | ORAL | Status: DC
  Administered 2016-12-30 – 2017-01-02 (×4): 12.5 mg via ORAL
  Filled 2016-12-30 (×5): qty 1

## 2016-12-30 MED ORDER — ONDANSETRON HCL 4 MG PO TABS: 4.0000 mg | ORAL_TABLET | Freq: Four times a day (QID) | ORAL | Status: DC | PRN

## 2016-12-30 MED ORDER — COENZYME Q10 30 MG PO CAPS: 30.0000 mg | ORAL_CAPSULE | Freq: Every day | ORAL | Status: DC

## 2016-12-30 MED ORDER — GADOBENATE DIMEGLUMINE 529 MG/ML IV SOLN
20.0000 mL | Freq: Once | INTRAVENOUS | Status: AC | PRN
  Administered 2016-12-30: 20 mL via INTRAVENOUS

## 2016-12-30 MED ORDER — LISINOPRIL 10 MG PO TABS
10.0000 mg | ORAL_TABLET | Freq: Every day | ORAL | Status: DC
  Administered 2016-12-30 – 2017-01-02 (×4): 10 mg via ORAL
  Filled 2016-12-30 (×5): qty 1

## 2016-12-30 MED ORDER — SENNOSIDES-DOCUSATE SODIUM 8.6-50 MG PO TABS
1.0000 | ORAL_TABLET | Freq: Every evening | ORAL | Status: DC | PRN
  Administered 2017-01-01: 1 via ORAL
  Filled 2016-12-30: qty 1

## 2016-12-30 MED ORDER — CRANBERRY 400 MG PO TABS: 2.0000 | ORAL_TABLET | Freq: Every day | ORAL | Status: DC

## 2016-12-30 MED ORDER — LISINOPRIL-HYDROCHLOROTHIAZIDE 10-12.5 MG PO TABS: 1.0000 | ORAL_TABLET | Freq: Every day | ORAL | Status: DC

## 2016-12-30 NOTE — Progress Notes (Signed)
PHARMACIST - PHYSICIAN ORDER COMMUNICATION  CONCERNING: P&T Medication Policy on Herbal Medications  DESCRIPTION:  This patient's order for:  Co-Q10 and Cranberry has been noted.  This product(s) is classified as an "herbal" or natural product. Due to a lack of definitive safety studies or FDA approval, nonstandard manufacturing practices, plus the potential risk of unknown drug-drug interactions while on inpatient medications, the Pharmacy and Therapeutics Committee does not permit the use of "herbal" or natural products of this type within [place].   ACTION TAKEN: The pharmacy department is unable to verify this order at this time and your patient has been informed of this safety policy. Please reevaluate patient's clinical condition at discharge and address if the herbal or natural product(s) should be resumed at that time.  

## 2016-12-30 NOTE — Progress Notes (Signed)
NIF -20, VC 1.1L 

## 2016-12-30 NOTE — H&P (Signed)
History and Physical    Kenneth Pace EBR:[id number] DOB: 28-Feb-1950 DOA: 12/29/2016  Referring MD/NP/PA:   PCP: Renato Shin, MD   Patient coming from:  The patient is coming from home.  At baseline, pt is independent for most of ADL.   Chief Complaint: Weakness in extremities, shortness of breath  HPI: Kenneth Pace is a 66 y.o. male with medical history significant of subacute inflammatory polyradiculoneuropathy, insulin-dependent diabetes on insulin pump, hypertension, sleep apnea on CPAP, hypertension, hyperlipidemia, depression, BPH, who presents with weakness in extremities and shortness of breath.  Patient was hospitalized from 10/5-10/9 due to progressive paresthesias and weakness of hands and legs. He was diagnosed as possible CIDP/AIDP vs Guillain-Barr. He was treated with IVIG with improvement of weakness. He could walk using walker at discharge. He has been doing physical therapy. Pt states that he has recurrent symptoms in the past 2 weeks, which have been progressively getting worse. He has weakness in all extremities, worse in both legs. He also has decreased sensation and numbness in forearm and lower extremeties bilaterally. He could not walk anymore. He states that he has shortness of breath. He reports that he feels like a band around his lower chest and upper abdomen, making him difficult to take deep breath. Patient does not have chest pain, cough, fever or chills. No recent viral infection symptoms. Patient denies nausea, vomiting, diarrhea, abdominal pain, symptoms of UTI. No vision change or hearing loss.  ED Course: pt was found to have WBC 8.5, negative troponin, electrolytes renal function okay, temperature normal, heart rate in the 90s, no tachypnea, oxygen saturation 98% on room air, negative chest x-ray. Patient is admitted to stepdown as inpatient. Neurology was consulted.  Review of Systems:   General: no fevers, chills, no body weight gain, has  fatigue HEENT: no blurry vision, hearing changes or sore throat Respiratory: has dyspnea, no coughing, wheezing CV: no chest pain, no palpitations GI: no nausea, vomiting, abdominal pain, diarrhea, constipation GU: no dysuria, burning on urination, increased urinary frequency, hematuria  Ext: has mild leg edema Neuro: has weakness, numbness and decreased sensations in extremities Skin: no rash, no skin tear. MSK: No muscle spasm, no deformity, no limitation of range of movement in spin Heme: No easy bruising.  Travel history: No recent long distant travel.  Allergy: No Known Allergies  Past Medical History:  Diagnosis Date  . ADENOIDECTOMY, HX OF 02/16/2007  . ANXIETY 02/16/2007  . BENIGN PROSTATIC HYPERTROPHY, WITH OBSTRUCTION 02/03/2010  . DIABETES MELLITUS, TYPE I, UNCONTROLLED 12/29/2006  . ERECTILE DYSFUNCTION 02/16/2007  . HYPERLIPIDEMIA 02/16/2007  . HYPERTENSION 02/16/2007  . TESTICULAR MASS, LEFT 02/16/2007    Past Surgical History:  Procedure Laterality Date  . KNEE SURGERY    . KNEE SURGERY Right 11/12/2016  . TONSILLECTOMY      Social History:  reports that  has never smoked. he has never used smokeless tobacco. He reports that he does not drink alcohol or use drugs.  Family History:  Family History  Problem Relation Age of Onset  . Dementia Mother   . Diabetes Mellitus I Mother   . Hypertension Father        44  . Healthy Sister   . Rheum arthritis Brother   . Cancer Neg Hx      Prior to Admission medications   Medication Sig Start Date End Date Taking? Authorizing Provider  acetaminophen (TYLENOL) 325 MG tablet Take 2 tablets (650 mg total) by mouth every 6 (six) hours  as needed for mild pain or headache. 11/24/16  Yes Mikhail, Clinical biochemist, DO  busPIRone (BUSPAR) 30 MG tablet TAKE ONE-HALF (1/2) TABLET (15 MG) TWICE A DAY Patient taking differently: 30MG BY MOUTH AT BEDTIME 02/28/15  Yes Renato Shin, MD  co-enzyme Q-10 30 MG capsule Take 30 mg by mouth  daily.     Yes [provider]  Continuous Glucose Monitor Sup (ENLITE GLUCOSE SENSOR) MISC 1 Device by Does not apply route every 3 (three) days. 03/18/16  Yes Renato Shin, MD  Cranberry 400 MG TABS Take 2 each by mouth daily.     Yes [provider]  Cyanocobalamin (VITAMIN B-12 CR PO) Take 1 each by mouth daily.     Yes [provider]  ezetimibe (ZETIA) 10 MG tablet TAKE 1 TABLET DAILY 05/03/16  Yes Renato Shin, MD  finasteride (PROSCAR) 5 MG tablet Take 5 mg by mouth daily.    Yes [provider]  glucagon 1 MG injection Inject 1 mg into the vein once as needed. Patient taking differently: Inject 1 mg once as needed into the vein (severe hypoglycemia).  07/25/13  Yes Renato Shin, MD  glucose blood (BAYER CONTOUR NEXT TEST) test strip Use to check blood sugar 5 times per day dx 250.01, 03/18/16  Yes Renato Shin, MD  HUMALOG 100 UNIT/ML injection INJECT 120 UNITS DAILY IN PUMP 10/26/16  Yes Renato Shin, MD  Insulin Infusion Pump Supplies (PARADIGM RESERVOIR 3ML) MISC 1 Device by Does not apply route every 3 (three) days. 01/21/16  Yes Renato Shin, MD  Insulin Infusion Pump Supplies (QUICK-SET INFUSION 43" 9MM) MISC 1 Device by Does not apply route every 3 (three) days. 03/18/16  Yes Renato Shin, MD  Lancets Misc. (ACCU-CHEK MULTICLIX LANCET DEV) KIT 1 Device by Other route 5 (five) times daily as needed. 5/day dx 250.01 01/03/15  Yes Renato Shin, MD  lisinopril-hydrochlorothiazide (PRINZIDE,ZESTORETIC) 10-12.5 MG tablet TAKE 1 TABLET DAILY 10/04/16  Yes Renato Shin, MD  Misc. Devices MISC IV 3000 infusion set cover.  Change every 3 days 12/08/12  Yes Renato Shin, MD  Multiple Vitamins-Minerals (MULTIVITAMIN,TX-MINERALS) tablet Take 1 tablet by mouth daily.     Yes [provider]  rosuvastatin (CRESTOR) 40 MG tablet TAKE 1 TABLET DAILY 08/21/16  Yes Renato Shin, MD    Physical Exam: Vitals:   12/30/16 0000 12/30/16 0030 12/30/16 0100  12/30/16 0130  BP: (!) 141/76 112/67 (!) 143/76 120/79  Pulse: 84 79 84 79  Resp: (!) 24 12    Temp:      TempSrc:      SpO2: 100% 99% 99% 98%  Weight:      Height:       General: Not in acute distress HEENT:       Eyes: PERRL, EOMI, no scleral icterus.       ENT: No discharge from the ears and nose, no pharynx injection, no tonsillar enlargement.        Neck: No JVD, no bruit, no mass felt. Heme: No neck lymph node enlargement. Cardiac: S1/S2, RRR, No murmurs, No gallops or rubs. Respiratory:  No rales, wheezing, rhonchi or rubs. GI: Soft, nondistended, nontender, no rebound pain, no organomegaly, BS present. GU: No hematuria Ext: has trace leg edema bilaterally. 2+DP/PT pulse bilaterally. Musculoskeletal: No joint deformities, No joint redness or warmth, no limitation of ROM in spin. Skin: No rashes.  Neuro: Alert, oriented X3, cranial nerves II-XII grossly intact. Muscle strength 1/5 legs and 3/5 in arms, sensation to  light touch decreased in arms and legs. Knee reflex 3+ bilaterally.  Psych: Patient is not psychotic, no suicidal or hemocidal ideation.  Labs on Admission: I have personally reviewed following labs and imaging studies  CBC: Recent Labs  Lab 12/29/16 2207  WBC 8.5  HGB 16.8  HCT 47.2  MCV 86.8  PLT 992   Basic Metabolic Panel: Recent Labs  Lab 12/29/16 2207  NA 136  K 3.6  CL 101  CO2 26  GLUCOSE 118*  BUN 21*  CREATININE 0.90  CALCIUM 9.2   GFR: Estimated Creatinine Clearance: 105.3 mL/min (by C-G formula based on SCr of 0.9 mg/dL). Liver Function Tests: No results for input(s): AST, ALT, ALKPHOS, BILITOT, PROT, ALBUMIN in the last 168 hours. No results for input(s): LIPASE, AMYLASE in the last 168 hours. No results for input(s): AMMONIA in the last 168 hours. Coagulation Profile: No results for input(s): INR, PROTIME in the last 168 hours. Cardiac Enzymes: No results for input(s): CKTOTAL, CKMB, CKMBINDEX, TROPONINI in the last 168  hours. BNP (last 3 results) No results for input(s): PROBNP in the last 8760 hours. HbA1C: No results for input(s): HGBA1C in the last 72 hours. CBG: Recent Labs  Lab 12/30/16 0108  GLUCAP 107*   Lipid Profile: No results for input(s): CHOL, HDL, LDLCALC, TRIG, CHOLHDL, LDLDIRECT in the last 72 hours. Thyroid Function Tests: No results for input(s): TSH, T4TOTAL, FREET4, T3FREE, THYROIDAB in the last 72 hours. Anemia Panel: No results for input(s): VITAMINB12, FOLATE, FERRITIN, TIBC, IRON, RETICCTPCT in the last 72 hours. Urine analysis:    Component Value Date/Time   COLORURINE AMBER (A) 11/20/2016 1202   APPEARANCEUR HAZY (A) 11/20/2016 1202   LABSPEC 1.022 11/20/2016 1202   PHURINE 5.0 11/20/2016 1202   GLUCOSEU >=500 (A) 11/20/2016 1202   GLUCOSEU NEGATIVE 03/18/2016 1515   HGBUR NEGATIVE 11/20/2016 1202   BILIRUBINUR NEGATIVE 11/20/2016 1202   KETONESUR 5 (A) 11/20/2016 1202   PROTEINUR NEGATIVE 11/20/2016 1202   UROBILINOGEN 1.0 03/18/2016 1515   NITRITE NEGATIVE 11/20/2016 1202   LEUKOCYTESUR NEGATIVE 11/20/2016 1202   Sepsis Labs: @LABRCNTIP (procalcitonin:4,lacticidven:4) )No results found for this or any previous visit (from the past 240 hour(s)).   Radiological Exams on Admission: Dg Chest 2 View  Result Date: 12/29/2016 CLINICAL DATA:  Shortness of breath EXAM: CHEST  2 VIEW COMPARISON:  Chest radiograph 04/22/2009 FINDINGS: The heart size and mediastinal contours are within normal limits. Both lungs are clear. The visualized skeletal structures are unremarkable. IMPRESSION: No active cardiopulmonary disease. Electronically Signed   By: Ulyses Jarred M.D.   On: 12/29/2016 22:54     EKG: Independently reviewed.  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 He has shortness of breath without any chest pain. Chest x-ray negative. Low suspicions for PE. His symptoms is concerning for flare up of inflammatory demyelinating polyradiculoneuropathy vs. variant of Guillain-Barr. Neurology, Dr. Rory Percy was consulted. He recommended to get MRI of the thoracic spine with and without contrast and hold off IVIG or plasma pheresis until they discuss with pt's outpatient neurologist. Currently patient is hemodynamically stable. No oxygen desaturation.  -will admit to SDU as  inpt -highly appreciate urology's consultation, the follow-up recommendations. -MRI-T spin w/ and w/o constrast was ordered -q4h NIF/FVC -will check Vb12, TSH, ESR, CRP, CK  Dyslipidemia: -continue crestor and zetia -check CK level to r/o statin toxicity  Essential hypertension: -continue prinzide -IV hydralazine when necessary  BPH: stable - Continue Proscar  Depression: Stable, no suicidal or homicidal ideations. -Continue home medications: BuSpar  Diabetes mellitus without complication ([place]): Last A1c 7.3 on 11/20/16, fairly controled. Patient is using insulin pump at home -continue insulin pump -consult to diabetic educator in help of management of insulin pump  DVT ppx: SCD Code Status: Full code Family Communication: None at bed side.     Disposition Plan:  Anticipate discharge back to previous home environment Consults called:  Dr. Rory Percy of neuro Admission status: SDU/inpation       Date of Service 12/30/2016    Ivor Costa [facility] Pager [phone]  If 7PM-7AM, please contact night-coverage [email or web address] Password [email or web address] 12/30/2016, 3:10 AM

## 2016-12-30 NOTE — Consult Note (Signed)
Neurology Consultation  Reason for Consult: Worsening lower extremity weakness, shortness of breath, leg stiffness Referring Physician: Dr. Wyvonnia Dusky  CC: Shortness of breath, worsening lower extremity weakness and stiffness  History is obtained from: Patient, wife, chart  HPI: Kenneth Pace is a 66 y.o. 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  Since receiving the IVIG, his weakness had improved.  Over the past 2 weeks, the wife reports that he has been having much more difficulty with walking.  He has been receiving physical therapy, and the therapist also noted over the past few days that his strength and walking has been worsening.  He also has been having increasing stiffness of his lower extremities.  For the last 2 days, he also started noticing shortness of breath.  He sits on a recliner and sleeps with his head and elevated otherwise he gets short of breath.  He also has been having difficulty swallowing food. He also reports that he feels like a band around his chest at the level of the xiphoid process and decreased sensation on his abdomen. According to his wife, he is also having trouble with his bladder-she says that he has had accidents and at times sits on the bathroom for a long time without realizing that he is urinating and has decreased sensation or urge. No recent fevers chills.  No recent complaints of chest pain.  No nausea vomiting.  No lateralized weakness.  He last saw Dr. Posey Pronto, who is his outpatient neurologist in October after discharge.  She noted that with his symptoms ongoing for 6 weeks at the time, he does not meet definition for CIDP however his nerve conduction/EMG and CSF are consistent with poly-radicular neuropathy.  His CSF  studies also showed elevated glucose suggesting that there might be some component of diabetic neuropathy.  It was our plan, that if he transitions to CIDP, he might need monthly IVIG infusions.  But her thought process was that this could be a IDP with him having a late nadir at 4-5 weeks and steadily recovering.  ROS: A 14 point ROS was performed and is negative except as noted in the HPI.   Past Medical History:  Diagnosis Date  . ADENOIDECTOMY, HX OF 02/16/2007  . ANXIETY 02/16/2007  . BENIGN PROSTATIC HYPERTROPHY, WITH OBSTRUCTION 02/03/2010  . DIABETES MELLITUS, TYPE I, UNCONTROLLED 12/29/2006  . ERECTILE DYSFUNCTION 02/16/2007  . HYPERLIPIDEMIA 02/16/2007  . HYPERTENSION 02/16/2007  . TESTICULAR MASS, LEFT 02/16/2007    Family History  Problem Relation Age of Onset  . Dementia Mother   . Diabetes Mellitus I Mother   . Hypertension Father        81  . Healthy Sister   . Rheum arthritis Brother   . Cancer Neg Hx     Social History:   reports that  has never smoked. he has never used smokeless tobacco. He reports that he does not drink alcohol or use drugs.  Medications No current facility-administered medications for this encounter.   Current Outpatient Medications:  .  acetaminophen (TYLENOL) 325 MG tablet, Take 2 tablets (650 mg total) by mouth every 6 (six) hours as needed for mild pain or headache., Disp: , Rfl:  .  busPIRone (BUSPAR) 30 MG tablet, TAKE ONE-HALF (1/2) TABLET (15 MG) TWICE A DAY (Patient  taking differently: 30MG BY MOUTH AT BEDTIME), Disp: 180 tablet, Rfl: 1 .  co-enzyme Q-10 30 MG capsule, Take 30 mg by mouth daily.  , Disp: , Rfl:  .  Continuous Glucose Monitor Sup (ENLITE GLUCOSE SENSOR) MISC, 1 Device by Does not apply route every 3 (three) days., Disp: 30 each, Rfl: 3 .  Cranberry 400 MG TABS, Take 2 each by mouth daily.  , Disp: , Rfl:  .  Cyanocobalamin (VITAMIN B-12 CR PO), Take 1 each by mouth daily.  , Disp: , Rfl:  .  ezetimibe (ZETIA) 10 MG  tablet, TAKE 1 TABLET DAILY, Disp: 90 tablet, Rfl: 3 .  finasteride (PROSCAR) 5 MG tablet, Take 5 mg by mouth daily. , Disp: , Rfl:  .  glucagon 1 MG injection, Inject 1 mg into the vein once as needed. (Patient taking differently: Inject 1 mg once as needed into the vein (severe hypoglycemia). ), Disp: 1 each, Rfl: 12 .  glucose blood (BAYER CONTOUR NEXT TEST) test strip, Use to check blood sugar 5 times per day dx 250.01,, Disp: 500 each, Rfl: 3 .  HUMALOG 100 UNIT/ML injection, INJECT 120 UNITS DAILY IN PUMP, Disp: 100 mL, Rfl: 11 .  Insulin Infusion Pump Supplies (PARADIGM RESERVOIR 3ML) MISC, 1 Device by Does not apply route every 3 (three) days., Disp: 30 each, Rfl: 3 .  Insulin Infusion Pump Supplies (QUICK-SET INFUSION 43" 9MM) MISC, 1 Device by Does not apply route every 3 (three) days., Disp: 30 each, Rfl: 3 .  Lancets Misc. (ACCU-CHEK MULTICLIX LANCET DEV) KIT, 1 Device by Other route 5 (five) times daily as needed. 5/day dx 250.01, Disp: 450 each, Rfl: 3 .  lisinopril-hydrochlorothiazide (PRINZIDE,ZESTORETIC) 10-12.5 MG tablet, TAKE 1 TABLET DAILY, Disp: 90 tablet, Rfl: 1 .  Misc. Devices MISC, IV 3000 infusion set cover.  Change every 3 days, Disp: 30 each, Rfl: 3 .  Multiple Vitamins-Minerals (MULTIVITAMIN,TX-MINERALS) tablet, Take 1 tablet by mouth daily.  , Disp: , Rfl:  .  rosuvastatin (CRESTOR) 40 MG tablet, TAKE 1 TABLET DAILY, Disp: 90 tablet, Rfl: 3  Exam: Current vital signs: BP 105/81 (BP Location: Right Arm)   Pulse 94   Temp 97.8 F (36.6 C) (Oral)   Resp 16   Ht 6' (1.829 m)   Wt 108 kg (238 lb)   SpO2 98%   BMI 32.28 kg/m  Vital signs in last 24 hours: Temp:  [97.8 F (36.6 C)] 97.8 F (36.6 C) (11/13 2203) Pulse Rate:  [92-94] 94 (11/13 2345) Resp:  [16-19] 16 (11/13 2345) BP: (105-146)/(81-91) 105/81 (11/13 2345) SpO2:  [98 %-99 %] 98 % (11/13 2345) Weight:  [108 kg (238 lb)] 108 kg (238 lb) (11/13 2204)  GENERAL: Awake, alert in NAD HEENT: -  Normocephalic and atraumatic, dry mm, no LN++, no Thyromegally LUNGS - Clear to auscultation bilaterally with no wheezes CV - S1S2 RRR, no m/r/g, equal pulses bilaterally. ABDOMEN - Soft, nontender, nondistended with normoactive BS Ext: warm, well perfused, intact peripheral pulses, 1+edema  NEURO:  Mental Status: AA&Ox3  Language: speech is non-dysarthric.  Naming, repetition, fluency, and comprehension intact. Cranial Nerves: PERRL 80m/brisk. EOMI, visual fields full, no facial asymmetry, facial sensation intact, hearing intact, tongue/uvula/soft palate midline, normal sternocleidomastoid and trapezius muscle strength. No evidence of tongue atrophy or fibrillations Motor:                              Right  Left Shoulder Abduction                5/5                   5/5 Elbow Flexion                          4/5                   4/5 Elbow Extension                     4/5                   4/5 Wrist Flexion                           4+/5                 4+/5 Wrist Extension                       5/5                   5/5 Interossei                                3/5                   3/5  Hip Flexion                              3/5                   3/5 Knee Flexion                           3/5                   3/5 Knee Extension                       3/5                   3/5 Ankle Dorsiflexion                   3/5                   3/5 Ankle Plantarflexion                3/5                   3/5 Tone: is increased in both lower extremities and also in upper extremity. Sensation-decreased sensation to pinprick temperature and light touch in a glove and stocking type pattern on both extremities as well as a sensory level at almost T1. Coordination: FTN intact bilaterally DTR: 3+ ankle jerks, 2+ all others Gait- deferred   Labs I have reviewed labs in epic and the results pertinent to this consultation are:  CBC    Component Value Date/Time   WBC 8.5  12/29/2016 2207   RBC 5.44 12/29/2016 2207   HGB 16.8 12/29/2016 2207   HCT 47.2 12/29/2016 2207   PLT 195 12/29/2016 2207  MCV 86.8 12/29/2016 2207   MCH 30.9 12/29/2016 2207   MCHC 35.6 12/29/2016 2207   RDW 13.2 12/29/2016 2207   LYMPHSABS 1.8 03/18/2016 1515   MONOABS 0.6 03/18/2016 1515   EOSABS 0.2 03/18/2016 1515   BASOSABS 0.0 03/18/2016 1515    CMP     Component Value Date/Time   NA 136 12/29/2016 2207   K 3.6 12/29/2016 2207   CL 101 12/29/2016 2207   CO2 26 12/29/2016 2207   GLUCOSE 118 (H) 12/29/2016 2207   BUN 21 (H) 12/29/2016 2207   CREATININE 0.90 12/29/2016 2207   CALCIUM 9.2 12/29/2016 2207   PROT 6.7 03/18/2016 1515   ALBUMIN 4.3 03/18/2016 1515   AST 19 03/18/2016 1515   ALT 26 03/18/2016 1515   ALKPHOS 182 (H) 03/18/2016 1515   BILITOT 0.7 03/18/2016 1515   GFRNONAA >60 12/29/2016 2207   GFRAA >60 12/29/2016 2207    Lipid Panel     Component Value Date/Time   CHOL 119 03/18/2016 1515   TRIG 209.0 (H) 03/18/2016 1515   HDL 34.80 (L) 03/18/2016 1515   CHOLHDL 3 03/18/2016 1515   VLDL 41.8 (H) 03/18/2016 1515   LDLCALC 38 08/22/2014 0821   LDLDIRECT 66.0 03/18/2016 1515   CSF findings from last admission in October -glucose 102, protein 81, WBCs 3.  Imaging I have reviewed the images obtained: No imaging available from today. MRI C-spine and L-spine from 11/20/2016 IMPRESSION: 1. No focal cord signal abnormality or lesion. 2. Multilevel spondylosis of the cervical spine as described. 3. Moderate central and mild bilateral foraminal stenosis at C3-4. 4. Moderate central and bilateral foraminal narrowing at C4-5. 5. Moderate central and severe bilateral foraminal stenosis at C5-6. 6. Moderate foraminal narrowing bilaterally at C6-7 without significant central canal stenosis. 7. Short pedicles in the cervical and lumbar spine contribute to the stenosis. 8. Disc bulging at L2-3 and L3-4 without significant stenosis. 9. Disc bulging and  facet hypertrophy at L4-5 with mild subarticular and foraminal stenosis bilaterally. 10. Mild left foraminal narrowing at L5-S1 secondary to asymmetric facet hypertrophy  "NCV & EMG Findings: Extensive electrodiagnostic testing of the right upper extremity and additional studies of the left shows:  1. Right median and left ulnar sensory nerves show prolonged distal peak latency (R4.5, L3.7 ms). Right ulnar sensory nerve showed prolonged distal peak latency (3.4 ms) and reduced amplitude (2.0 V). Bilateral radial sensory responses are within normal limits. 2. Right ulnar motor response shows prolonged latency, reduced amplitude, and conduction velocity slowing across the elbow (A Elbow-B Elbow, R27, R30 m/s). The left ulnar motor response shows conduction velocity slowing across the elbow (A Elbow-B Elbow, L45, L45 m/s) with normal latency and amplitude. The right ulnar motor response at the first dorsal interosseous shows mild temporal dispersion. There is no conduction block involving any of the tested muscles. 3. Right ulnar F-wave shows mildly prolonged latency (35.65 ms).  4. Chronic axonal loss changes are seen affecting bilateral abductor digit minimi and first dorsal interosseous muscles, which is worse on the right with there is also evidence of active denervation. There is no evidence of fibrillation potentials involving this low cervical paraspinal muscles. 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  With the acute onset of his symptoms, further electrodiagnostic testing of the lower extremities and formal neurological consultation is recommended to evaluate for polyradiculoneuropathy."  Assessment:  66 year old man with past  history of IDDM on insulin pump, hypertension, sleep apnea on CPAP, subacute inflammatory  polyradiculoneuropathy, who was treated with IVIG for 5 days in October for his lower extremity paresthesias and weakness, presents today for worsening lower extremity weakness, increasing tone in his lower extremities and difficulty breathing as well as dysphagia over the past few days. His exam today in terms of his strength is significant for worse lower extremity strength than October. He is barely antigravity in hip flexors, whereas upon my last examination in October he was 4/5. He also has more sensory loss in a glove and stocking type pattern as well as a level at T1. At this time, I will pursue further imaging to rule out transverse myelitis especially in the thoracic spine. His cervical spine and lumbar spine imaging from the last visit was suggestive of degenerative disc disease at multi levels but did not show any cord pathology. At this time, considerations include polyradiculoneuropathy versus transverse myelitis. His exam even at that time was atypical for a Guillain-Barr or again but a variant because he was never areflexic and actually was hyperreflexic in his knees, which he continues to be today as well. He has a documented history of a testicular mass in 2008, I do not have more details on that yet.  Another consideration based on that would be a paraneoplastic phenomenon.  Impression: Evaluate for polyradiculoneuropathy Evaluate for transverse myelitis  Recommendations: For now, I would recommend obtaining a thoracic spine MRI with and without contrast. I would hold off on IVIG or plasmapheresis for now until we get in touch with the outpatient neurologist and have imaging available. I would also recommend obtaining NIF and FVC is every 4 hours. Neurology will follow with you and provide further recommendations after testing this with the outpatient neurologist as well as obtaining the imaging results. Call with questions.  -- Amie Portland, MD [facility] [phone] If 7pm to 7am, please call on call as listed on AMION.

## 2016-12-30 NOTE — Progress Notes (Signed)
[facility] TEAM 1 - Stepdown/ICU TEAM  Kenneth Pace  PXT:[id number] DOB: Nov 05, 1950 DOA: 12/29/2016 PCP: Renato Shin, MD    Brief Narrative:  66 y.o. male with history of subacute inflammatory polyradiculoneuropathy, DM on insulin pump, HTN, sleep apnea on CPAP, HLD, depression, and BPH who presented with weakness in his extremities and shortness of breath.  The patient was hospitalized from 10/5-10/9 due to progressive paresthesias and weakness of hands and legs. 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 He could walk using a walker at discharge. His symptoms have returned over the past 2 weeks, with weakness in all extremities, worse in both legs. He has noted decreased sensation and numbness in his forearms and lower extremeties bilaterally. He lost the ability to walk. When he began to develop SOB he presented to the ED for evaluation.    Subjective: Pt is seen for a f/u visit.    Assessment & Plan:  ?Chronic inflammatory demyelinating polyradiculoneuropathy Etiology of his recurring sx is not clear - Neurology will direct care of this issue   Dyslipidemia continue crestor and zetia  Mild hypokalemia Supplement   HTN Follow trend   BPH Continue Proscar  Depression Stable  Diabetes mellitus A1c 7.3 on 11/20/16 - using insulin pump at home - continue insulin pump  DVT prophylaxis: SCDs until clear if procedures will be required  Code Status: FULL CODE Family Communication: no family present at time of exam  Disposition Plan: SDU  Consultants:  Neurology   Procedures: none  Antimicrobials:  none   Objective: Blood pressure (!) 142/75, pulse 80, temperature 97.8 F (36.6 C), temperature source Oral, resp. rate 15, height 6' (1.829 m), weight 108 kg (238 lb), SpO2 97 %. No intake or output data in the 24 hours ending 12/30/16 1027 Filed Weights   12/29/16 2204  Weight: 108 kg (238 lb)     Examination: Pt was seen for a f/u visit.    CBC: Recent Labs  Lab 12/29/16 2207 12/30/16 0311  WBC 8.5 8.7  HGB 16.8 15.7  HCT 47.2 43.8  MCV 86.8 86.6  PLT 195 627   Basic Metabolic Panel: Recent Labs  Lab 12/29/16 2207 12/30/16 0311  NA 136 137  K 3.6 3.3*  CL 101 105  CO2 26 27  GLUCOSE 118* 70  BUN 21* 23*  CREATININE 0.90 0.92  CALCIUM 9.2 9.0   GFR: Estimated Creatinine Clearance: 103 mL/min (by C-G formula based on SCr of 0.92 mg/dL).  Liver Function Tests: No results for input(s): AST, ALT, ALKPHOS, BILITOT, PROT, ALBUMIN in the last 168 hours. No results for input(s): LIPASE, AMYLASE in the last 168 hours. No results for input(s): AMMONIA in the last 168 hours.  Coagulation Profile: Recent Labs  Lab 12/29/16 2207  INR 1.04    Cardiac Enzymes: Recent Labs  Lab 12/30/16 0311  CKTOTAL 223    HbA1C: Hemoglobin A1C  Date/Time Value Ref Range Status  10/30/2016 08:56 AM 7.2  Final  08/12/2016 10:15 AM 7.3  Final   Hgb A1c MFr Bld  Date/Time Value Ref Range Status  11/20/2016 06:57 PM 7.3 (H) 4.8 - 5.6 % Final    Comment:    (NOTE) Pre diabetes:          5.7%-6.4% Diabetes:              >6.4% Glycemic control for   <7.0% adults with diabetes   08/22/2014 08:21 AM 8.4 (H) 4.6 -  6.5 % Final    Comment:    Glycemic Control Guidelines for People with Diabetes:Non Diabetic:  <6%Goal of Therapy: <7%Additional Action Suggested:  >8%     CBG: Recent Labs  Lab 12/30/16 0108 12/30/16 0733  GLUCAP 107* 111*     Scheduled Meds: . busPIRone  30 mg Oral BID  . ezetimibe  10 mg Oral Daily  . finasteride  5 mg Oral Daily  . lisinopril  10 mg Oral Daily   And  . hydrochlorothiazide  12.5 mg Oral Daily  . multivitamin with minerals  1 tablet Oral Daily  . rosuvastatin  40 mg Oral Daily  . vitamin B-12  1,000 mcg Oral Daily     LOS: 0 days   Time spent: No Charge  Cherene Altes, MD [facility] Office   [phone] Pager - Text Page per Amion as per below:  On-Call/Text Page:      [email or web address]      password [email or web address]  If 7PM-7AM, please contact night-coverage [email or web address] Password [facility] 12/30/2016, 10:27 AM

## 2016-12-30 NOTE — Evaluation (Signed)
Physical Therapy Evaluation Patient Details Name: Kenneth Pace MRN: [id number] DOB: 06-13-1950 Today's Date: 12/30/2016   History of Present Illness  Pt is a 66 y/o male admitted secondary to increased weakness in UEs and LEs. Pt previously admitted earlier in October of 2018 secondary to suspected GB. Has had rapid decline since working with PT and OT. MRI of thoracic spine revealed disk protrusions with secondary cord flattening at T5-T10. Pending CT of head. PMH includes DM, HTN, L testicular mass, R knee surgery, and OSA on CPAP.   Clinical Impression  Pt admitted secondary to problem above with deficits below. PTA, pt had rapid decline in strength and required increased assist for ambulation with RW. Unable to perform stair navigation at home secondary to weakness. Upon eval, pt very weak in LEs and UEs. Also demonstrating decreased core strength. Max A for bed mobility this session. Unsafe to attempt standing without second person assist secondary to increased weakness. Discussed d/c disposition and possibility for need for subacute rehab. Pt's wife reports pt will be getting plasmophoresis treatments while in the hospital, so d/c disposition TBD pending pt improvement. Will continue to follow acutely and update d/c recommendations according to pt progress.     Follow Up Recommendations Supervision/Assistance - 24 hour(TBD pending pt improvement )    Equipment Recommendations  Wheelchair (measurements PT);Wheelchair cushion (measurements PT);3in1 (PT)    Recommendations for Other Services       Precautions / Restrictions Precautions Precautions: Fall Restrictions Weight Bearing Restrictions: No      Mobility  Bed Mobility Overal bed mobility: Needs Assistance Bed Mobility: Supine to Sit;Sit to Supine     Supine to sit: Max assist Sit to supine: Mod assist   General bed mobility comments: Max A for trunk elevation to come up to sitting. Very poor core strength and  heavily reliant on use of UEs. Also required assist to move LEs towards EOB. Mod A to return to supine. Required assist for LE lift assist and for controlled trunk descent.   Transfers                 General transfer comment: Unsafe to attempt without second person this session secondary to BLE weakness.   Ambulation/Gait                Stairs            Wheelchair Mobility    Modified Rankin (Stroke Patients Only)       Balance Overall balance assessment: Needs assistance Sitting-balance support: Bilateral upper extremity supported;Feet unsupported Sitting balance-Leahy Scale: Poor Sitting balance - Comments: Reliant on UE support for balance in sitting                                      Pertinent Vitals/Pain Pain Assessment: No/denies pain    Home Living Family/patient expects to be discharged to:: Private residence Living Arrangements: Spouse/significant other Available Help at Discharge: Family;Available 24 hours/day Type of Home: House Home Access: Stairs to enter Entrance Stairs-Rails: None Entrance Stairs-Number of Steps: 3 Home Layout: Two level;Bed/bath upstairs;Able to live on main level with bedroom/bathroom Home Equipment: [place] - single point;Crutches;Walker - 2 wheels;Walker - 4 wheels;Electric scooter Additional Comments: Reports he has not been going upstairs as it has become too difficult.     Prior Function Level of Independence: Needs assistance   Gait / Transfers Assistance Needed: Reports he has  been requiring increased assist for gait with RW and has had a fall secondary to weakness.   ADL's / Homemaking Assistance Needed: Has been sponge bathing with assist from his wife.         Hand Dominance   Dominant Hand: Right    Extremity/Trunk Assessment   Upper Extremity Assessment Upper Extremity Assessment: LUE deficits/detail;RUE deficits/detail RUE Deficits / Details: Reports decreased sensation on back  side of his forearm. Able to lift arms above head, however, extremely decreased grip strength.  RUE Sensation: decreased light touch LUE Deficits / Details: Able to lift arms above head, however, extremely decreased grip strength.     Lower Extremity Assessment Lower Extremity Assessment: RLE deficits/detail;LLE deficits/detail RLE Deficits / Details: Only able to perform partial heel slide in supine secondary to weakness. 2-/5 in hip flexors; 3/5 in ankles.  LLE Deficits / Details: Only able to perform partial heel slide in supine secondary to weakness. 2-/5 in hip flexors, and 3/5 in ankles.     Cervical / Trunk Assessment Cervical / Trunk Assessment: Other exceptions Cervical / Trunk Exceptions: Decreased trunk strength noted   Communication   Communication: No difficulties  Cognition Arousal/Alertness: Awake/alert Behavior During Therapy: WFL for tasks assessed/performed Overall Cognitive Status: Within Functional Limits for tasks assessed                                        General Comments General comments (skin integrity, edema, etc.): Pt's wife present during session. Reports he will be getting plasmophoresis treatments while in hospital     Exercises Other Exercises Other Exercises: Trunk perturbation exercise in multiple direction X 10. Only able to tolerate minimal perturbations and reliant heavily on UEs to maintain balance.    Assessment/Plan    PT Assessment Patient needs continued PT services  PT Problem List Decreased strength;Decreased balance;Decreased mobility;Decreased coordination;Decreased knowledge of use of DME;Impaired sensation       PT Treatment Interventions DME instruction;Gait training;Functional mobility training;Therapeutic activities;Therapeutic exercise;Balance training;Neuromuscular re-education;Patient/family education;Stair training    PT Goals (Current goals can be found in the Care Plan section)  Acute Rehab PT  Goals Patient Stated Goal: to get better  PT Goal Formulation: With patient Time For Goal Achievement: 01/13/17 Potential to Achieve Goals: Good    Frequency Min 3X/week   Barriers to discharge        Co-evaluation               AM-PAC PT "6 Clicks" Daily Activity  Outcome Measure Difficulty turning over in bed (including adjusting bedclothes, sheets and blankets)?: Unable Difficulty moving from lying on back to sitting on the side of the bed? : Unable Difficulty sitting down on and standing up from a chair with arms (e.g., wheelchair, bedside commode, etc,.)?: Unable Help needed moving to and from a bed to chair (including a wheelchair)?: Total Help needed walking in hospital room?: Total Help needed climbing 3-5 steps with a railing? : Total 6 Click Score: 6    End of Session   Activity Tolerance: Patient tolerated treatment well Patient left: in bed;with call bell/phone within reach;with family/visitor present Nurse Communication: Mobility status PT Visit Diagnosis: Unsteadiness on feet (R26.81);History of falling (Z91.81);Muscle weakness (generalized) (M62.81);Difficulty in walking, not elsewhere classified (R26.2);Other symptoms and signs involving the nervous system (R29.898)    Time: 3762-8315 PT Time Calculation (min) (ACUTE ONLY): 23 min   Charges:  PT Evaluation $PT Eval Moderate Complexity: 1 Mod PT Treatments $Therapeutic Activity: 8-22 mins   PT G Codes:        Leighton Ruff, PT, DPT  Acute Rehabilitation Services  Pager: [phone]   Rudean Hitt 12/30/2016, 12:15 PM

## 2016-12-30 NOTE — Telephone Encounter (Signed)
Received a call last night around 9 pm from patient and family.  He was recently in hospital for GBS.  Over the past 3 days, he has noted decline described as increased weakness, difficulty coughing and tightness around his torso at level of his diaphragm.  I advised he and his family to take him to the ED for further evaluation and acute treatment.

## 2016-12-30 NOTE — Progress Notes (Signed)
Please see full note from Dr. Rory Percy at this morning.  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   Methylmalonic acid negative SPEP negative OC bands negative  CSF Protein 170  1) Vit A, D, E,K 2) Copper, Zinc 3) Serum ACE 4) SSA, SSB, ANA(Positive results will be difficult to interpret in setting of IVIG) 5) Repeat imaging of C-spine, will also include brain MRI as well. Will do precontrast images now as will have to wait for post-contrast imaging.  6) will follow.   Roland Rack, MD [facility] [phone]  If 7pm- 7am, please page neurology on call as listed in [place].

## 2016-12-30 NOTE — ED Provider Notes (Signed)
[facility] EMERGENCY DEPARTMENT Provider Note   CSN: [id number] Arrival date & time: 12/29/16  2157     History   Chief Complaint Chief Complaint  Patient presents with  . Shortness of Breath    HPI Kenneth Pace is a 66 y.o. male.  Patient presents to the emergency department with a chief complaint of shortness of breath.  He states that he was recently diagnosed with Kenneth Pace in October.  He reports that over the past week or so he has had worsening symptoms.  He states that he is unable to walk now.  Reports increased weakness in his upper extremities, and is now complaining of shortness of breath and feels like there is a belt around his upper abdomen.  He reports that he has been going to physical therapy and occupational therapy, but has had no improvement of his symptoms, and reports that his symptoms are worsening.  He is concerned about the shortness of breath, and states that he does not want to end up on a vent.   The history is provided by the patient. No language interpreter was used.    Past Medical History:  Diagnosis Date  . ADENOIDECTOMY, HX OF 02/16/2007  . ANXIETY 02/16/2007  . BENIGN PROSTATIC HYPERTROPHY, WITH OBSTRUCTION 02/03/2010  . DIABETES MELLITUS, TYPE I, UNCONTROLLED 12/29/2006  . ERECTILE DYSFUNCTION 02/16/2007  . HYPERLIPIDEMIA 02/16/2007  . HYPERTENSION 02/16/2007  . TESTICULAR MASS, LEFT 02/16/2007    Patient Active Problem List   Diagnosis Date Noted  . 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 Weakness 11/20/2016  . Weakness of both arms 10/30/2016  . Numbness 03/18/2016  . Diabetes ([place]) 04/06/2015  . Eustachian tube dysfunction 02/12/2015  . Acute maxillary sinusitis 02/12/2015  . Wellness examination 08/22/2014  . Alkaline phosphatase elevation 08/22/2014  . Screening for prostate cancer 04/24/2013  . Routine general medical examination at a health care facility  04/10/2012  . BENIGN PROSTATIC HYPERTROPHY, WITH OBSTRUCTION 02/03/2010  . HEMOCCULT POSITIVE STOOL 05/25/2008  . ELBOW PAIN, RIGHT 07/04/2007  . Dyslipidemia 02/16/2007  . ANXIETY 02/16/2007  . ERECTILE DYSFUNCTION 02/16/2007  . Essential hypertension 02/16/2007  . TESTICULAR MASS, LEFT 02/16/2007  . KNEE PAIN, CHRONIC 02/16/2007  . ADENOIDECTOMY, HX OF 02/16/2007    Past Surgical History:  Procedure Laterality Date  . KNEE SURGERY    . KNEE SURGERY Right 11/12/2016  . TONSILLECTOMY         Home Medications    Prior to Admission medications   Medication Sig Start Date End Date Taking? Authorizing Provider  acetaminophen (TYLENOL) 325 MG tablet Take 2 tablets (650 mg total) by mouth every 6 (six) hours as needed for mild pain or headache. 11/24/16  Yes Mikhail, Clinical biochemist, DO  busPIRone (BUSPAR) 30 MG tablet TAKE ONE-HALF (1/2) TABLET (15 MG) TWICE A DAY Patient taking differently: 30MG BY MOUTH AT BEDTIME 02/28/15  Yes Renato Shin, MD  co-enzyme Q-10 30 MG capsule Take 30 mg by mouth daily.     Yes [provider]  Continuous Glucose Monitor Sup (ENLITE GLUCOSE SENSOR) MISC 1 Device by Does not apply route every 3 (three) days. 03/18/16  Yes Renato Shin, MD  Cranberry 400 MG TABS Take 2 each by mouth daily.     Yes [provider]  Cyanocobalamin (VITAMIN B-12 CR PO) Take 1 each by mouth daily.     Yes [provider]  ezetimibe (ZETIA) 10 MG tablet TAKE 1 TABLET DAILY 05/03/16  Yes Renato Shin, MD  finasteride (PROSCAR) 5 MG tablet Take 5 mg by mouth daily.    Yes [provider]  glucagon 1 MG injection Inject 1 mg into the vein once as needed. Patient taking differently: Inject 1 mg once as needed into the vein (severe hypoglycemia).  07/25/13  Yes Renato Shin, MD  glucose blood (BAYER CONTOUR NEXT TEST) test strip Use to check blood sugar 5 times per day dx 250.01, 03/18/16  Yes Renato Shin, MD  HUMALOG 100 UNIT/ML injection INJECT 120  UNITS DAILY IN PUMP 10/26/16  Yes Renato Shin, MD  Insulin Infusion Pump Supplies (PARADIGM RESERVOIR 3ML) MISC 1 Device by Does not apply route every 3 (three) days. 01/21/16  Yes Renato Shin, MD  Insulin Infusion Pump Supplies (QUICK-SET INFUSION 43" 9MM) MISC 1 Device by Does not apply route every 3 (three) days. 03/18/16  Yes Renato Shin, MD  Lancets Misc. (ACCU-CHEK MULTICLIX LANCET DEV) KIT 1 Device by Other route 5 (five) times daily as needed. 5/day dx 250.01 01/03/15  Yes Renato Shin, MD  lisinopril-hydrochlorothiazide (PRINZIDE,ZESTORETIC) 10-12.5 MG tablet TAKE 1 TABLET DAILY 10/04/16  Yes Renato Shin, MD  Misc. Devices MISC IV 3000 infusion set cover.  Change every 3 days 12/08/12  Yes Renato Shin, MD  Multiple Vitamins-Minerals (MULTIVITAMIN,TX-MINERALS) tablet Take 1 tablet by mouth daily.     Yes [provider]  rosuvastatin (CRESTOR) 40 MG tablet TAKE 1 TABLET DAILY 08/21/16  Yes Renato Shin, MD    Family History Family History  Problem Relation Age of Onset  . Dementia Mother   . Diabetes Mellitus I Mother   . Hypertension Father        8  . Healthy Sister   . Rheum arthritis Brother   . Cancer Neg Hx     Social History Social History   Tobacco Use  . Smoking status: Never Smoker  . Smokeless tobacco: Never Used  Substance Use Topics  . Alcohol use: No  . Drug use: No     Allergies   Patient has no known allergies.   Review of Systems Review of Systems  All other systems reviewed and are negative.    Physical Exam Updated Vital Signs BP 105/81 (BP Location: Right Arm)   Pulse 94   Temp 97.8 F (36.6 C) (Oral)   Resp 16   Ht 6' (1.829 m)   Wt 108 kg (238 lb)   SpO2 98%   BMI 32.28 kg/m   Physical Exam  Constitutional: He is oriented to person, place, and time. He appears well-developed and well-nourished.  HENT:  Head: Normocephalic and atraumatic.  Eyes: Conjunctivae and EOM are normal. Pupils are equal, round, and  reactive to light. Right eye exhibits no discharge. Left eye exhibits no discharge. No scleral icterus.  Neck: Normal range of motion. Neck supple. No JVD present.  Cardiovascular: Normal rate, regular rhythm and normal heart sounds. Exam reveals no gallop and no friction rub.  No murmur heard. Pulmonary/Chest: Effort normal and breath sounds normal. No respiratory distress. He has no wheezes. He has no rales. He exhibits no tenderness.  Abdominal: Soft. He exhibits no distension and no mass. There is no tenderness. There is no rebound and no guarding.  Musculoskeletal: Normal range of motion. He exhibits no edema or tenderness.  Neurological: He is alert and oriented to person, place, and time.  Upper extremity strength is 3/5 Lower extremity strength 2/5 Unable to ambulate without two-person assist CN III-XII intact Speech  is clear Sensation intact throughout Normal reflexes  Skin: Skin is warm and dry.  Psychiatric: He has a normal mood and affect. His behavior is normal. Judgment and thought content normal.  Nursing note and vitals reviewed.    ED Treatments / Results  Labs (all labs ordered are listed, but only abnormal results are displayed) Labs Reviewed  BASIC METABOLIC PANEL - Abnormal; Notable for the following components:      Result Value   Glucose, Bld 118 (*)    BUN 21 (*)    All other components within normal limits  CBC  I-STAT TROPONIN, ED    EKG  EKG Interpretation  Date/Time:  Tuesday December 29 2016 21:59:34 EST Ventricular Rate:  88 PR Interval:  150 QRS Duration: 86 QT Interval:  362 QTC Calculation: 438 R Axis:   39 Text Interpretation:  Normal sinus rhythm Low voltage QRS Cannot rule out Anterior infarct , age undetermined Abnormal ECG No significant change was found Confirmed by Ezequiel Essex [phone]) on 12/30/2016 12:34:36 AM       Radiology Dg Chest 2 View  Result Date: 12/29/2016 CLINICAL DATA:  Shortness of breath EXAM: CHEST  2  VIEW COMPARISON:  Chest radiograph 04/22/2009 FINDINGS: The heart size and mediastinal contours are within normal limits. Both lungs are clear. The visualized skeletal structures are unremarkable. IMPRESSION: No active cardiopulmonary disease. Electronically Signed   By: Ulyses Jarred M.D.   On: 12/29/2016 22:54    Procedures Procedures (including critical care time)  Medications Ordered in ED Medications - No data to display   Initial Impression / Assessment and Plan / ED Course  I have reviewed the triage vital signs and the nursing notes.  Pertinent labs & imaging results that were available during my care of the patient were reviewed by me and considered in my medical decision making (see chart for details).     Patient with recent diagnosis of Guillain Barr.  He is here with worsening weakness of his upper and lower extremities.  Now complaining of shortness of breath.  The shortness of breath started yesterday.  He is unable to ambulate without two-person assist.  Patient discussed with Dr. Wyvonnia Dusky.  Will consult neurology.  Discussed with Dr. Faythe Ghee from neurology, who will evaluate the patient.  2:04 AM Appreciate Dr. Blaine Hamper, who will admit.  Final Clinical Impressions(s) / ED Diagnoses   Final diagnoses:  GBS (Guillain Barre syndrome) [facility])    ED Discharge Orders    None       Montine Circle, PA-C 12/30/16 0205    Ezequiel Essex, MD 12/30/16 [phone]

## 2016-12-30 NOTE — Progress Notes (Signed)
NIF -40 x2,, VC 2.1L. Great effort. No distress noted. SAT 98% on RA
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
## 2016-12-30 NOTE — Progress Notes (Signed)
Peak flow done with 3 different attempts all with good effort. Best reached was 390.

## 2016-12-30 NOTE — Progress Notes (Signed)
Inpatient Diabetes Program Recommendations  AACE/ADA: New Consensus Statement on Inpatient Glycemic Control (2015)  Target Ranges:  Prepandial:   less than 140 mg/dL      Peak postprandial:   less than 180 mg/dL (1-2 hours)      Critically ill patients:  140 - 180 mg/dL   Lab Results  Component Value Date   GLUCAP 111 (H) 12/30/2016   HGBA1C 7.3 (H) 11/20/2016   Review of Glycemic Control  Diabetes history: DM 1 (Managed by Dr. Loanne Drilling) Outpatient Diabetes medications: Insulin Pump, Humalog Current orders for Inpatient glycemic control: Insulin pump order set  Consult to manage and monitor insulin pump while inpatient  A1c 7.3% on 10/5  Glucose trends: 107, 111 since admission will watch trends for now while on pump. Patient trends at goal for now.  Thanks,  Tama Headings RN, MSN, [facility] Inpatient Diabetes Coordinator Team Pager [phone] (8a-5p)

## 2016-12-30 NOTE — Progress Notes (Addendum)
NIF -40 and VC 3L Great pt effort, no distress. Home CPAP setup for pt, pt stated he would place self on when ready.

## 2016-12-31 ENCOUNTER — Inpatient Hospital Stay (HOSPITAL_COMMUNITY): Payer: BLUE CROSS/BLUE SHIELD

## 2016-12-31 DIAGNOSIS — R29898 Other symptoms and signs involving the musculoskeletal system: Secondary | ICD-10-CM

## 2016-12-31 DIAGNOSIS — G6181 Chronic inflammatory demyelinating polyneuritis: Principal | ICD-10-CM

## 2016-12-31 LAB — CBC
HEMATOCRIT: 45.4 % (ref 39.0–52.0)
Hemoglobin: 15.7 g/dL (ref 13.0–17.0)
MCH: 30.3 pg (ref 26.0–34.0)
MCHC: 34.6 g/dL (ref 30.0–36.0)
MCV: 87.5 fL (ref 78.0–100.0)
Platelets: 169 10*3/uL (ref 150–400)
RBC: 5.19 MIL/uL (ref 4.22–5.81)
RDW: 13.2 % (ref 11.5–15.5)
WBC: 8.2 10*3/uL (ref 4.0–10.5)

## 2016-12-31 LAB — GLUCOSE, CSF: Glucose, CSF: 64 mg/dL (ref 40–70)

## 2016-12-31 LAB — COMPREHENSIVE METABOLIC PANEL
ALBUMIN: 3.5 g/dL (ref 3.5–5.0)
ALK PHOS: 152 U/L — AB (ref 38–126)
ALT: 27 U/L (ref 17–63)
AST: 26 U/L (ref 15–41)
Anion gap: 6 (ref 5–15)
BILIRUBIN TOTAL: 0.7 mg/dL (ref 0.3–1.2)
BUN: 15 mg/dL (ref 6–20)
CALCIUM: 8.9 mg/dL (ref 8.9–10.3)
CO2: 26 mmol/L (ref 22–32)
Chloride: 106 mmol/L (ref 101–111)
Creatinine, Ser: 0.82 mg/dL (ref 0.61–1.24)
GFR calc Af Amer: >60 mL/min (ref >60)
GFR calc non Af Amer: >60 mL/min (ref >60)
GLUCOSE: 141 mg/dL — AB (ref 65–99)
POTASSIUM: 3.8 mmol/L (ref 3.5–5.1)
Sodium: 138 mmol/L (ref 135–145)
TOTAL PROTEIN: 6.6 g/dL (ref 6.5–8.1)

## 2016-12-31 LAB — VITAMIN D 25 HYDROXY (VIT D DEFICIENCY, FRACTURES): Vit D, 25-Hydroxy: 38.7 ng/mL (ref 30.0–100.0)

## 2016-12-31 LAB — GLUCOSE, CAPILLARY
GLUCOSE-CAPILLARY: 129 mg/dL — AB (ref 65–99)
GLUCOSE-CAPILLARY: 318 mg/dL — AB (ref 65–99)
Glucose-Capillary: 172 mg/dL — ABNORMAL HIGH (ref 65–99)
Glucose-Capillary: 140 mg/dL — ABNORMAL HIGH (ref 65–99)
Glucose-Capillary: 243 mg/dL — ABNORMAL HIGH (ref 65–99)

## 2016-12-31 LAB — SJOGRENS SYNDROME-A EXTRACTABLE NUCLEAR ANTIBODY: SSA (Ro) (ENA) Antibody, IgG: <0.2 AI (ref 0.0–0.9)

## 2016-12-31 LAB — CSF CELL COUNT WITH DIFFERENTIAL
RBC COUNT CSF: 1 /mm3 — AB
Tube #: 3
WBC CSF: 7 /mm3 — AB (ref 0–5)

## 2016-12-31 LAB — MAGNESIUM: Magnesium: 2 mg/dL (ref 1.7–2.4)

## 2016-12-31 LAB — ANA W/REFLEX IF POSITIVE: Anti Nuclear Antibody(ANA): NEGATIVE

## 2016-12-31 LAB — PHOSPHORUS: PHOSPHORUS: 3.6 mg/dL (ref 2.5–4.6)

## 2016-12-31 LAB — SJOGRENS SYNDROME-B EXTRACTABLE NUCLEAR ANTIBODY: SSB (La) (ENA) Antibody, IgG: <0.2 AI (ref 0.0–0.9)

## 2016-12-31 LAB — PROTEIN, CSF: Total  Protein, CSF: 121 mg/dL — ABNORMAL HIGH (ref 15–45)

## 2016-12-31 MED ORDER — LIDOCAINE HCL (PF) 1 % IJ SOLN
5.0000 mL | Freq: Once | INTRAMUSCULAR | Status: AC
  Administered 2016-12-31: 5 mL via INTRADERMAL

## 2016-12-31 MED ORDER — LIDOCAINE HCL (PF) 1 % IJ SOLN
INTRAMUSCULAR | Status: AC
  Filled 2016-12-31: qty 5

## 2016-12-31 MED ORDER — POLYETHYLENE GLYCOL 3350 17 G PO PACK
17.0000 g | PACK | Freq: Two times a day (BID) | ORAL | Status: DC
  Administered 2016-12-31 – 2017-01-07 (×11): 17 g via ORAL
  Filled 2016-12-31 (×16): qty 1

## 2016-12-31 NOTE — Progress Notes (Addendum)
Subjective: No significant changes  Exam: Vitals:   12/31/16 0732 12/31/16 1129  BP: (!) 141/90 135/82  Pulse: 73 72  Resp: 20 18  Temp:  98.3 F (36.8 C)  SpO2: 98% 98%   Gen: In bed, NAD Resp: non-labored breathing, no acute distress Abd: soft, nt  Neuro: MS: awake, alert QS:XQKSK, EOMI Motor: 4/5 BUE, 3/5 BLE Sensory:decreased to elbow bialterally SHN:GITJL throughout, with non-sustained clonus at the ankle.  He has fasciculations bilaterally  Pertinent Labs: Vit d - normal  Impression: 66 year old male with progressive weakness, numbness fasciculations, hyperreflexia.  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   I feel that his exam is most consistent with a myeloneuropathy.  With the new fasciculations starting recently, I doubt that the EMG findings were chronic related to diabetes superimposed on just a myelopathy.  With sensory changes, I feel that ALS superimposed on underlying neuropathy is also relatively unlikely.  I have sent labs including  Vitamin A Vitamin D Vitamin K Copper, zinc Serum ACE SSA, SSB, ANA   Recommendations: 1) MRI brain, cervical spine 2) lumbar puncture for paraneoplastic panel 3) neurology will continue to follow  Roland Rack, MD [facility] [phone]  If 7pm- 7am, please page neurology on call as listed in [place].

## 2016-12-31 NOTE — Progress Notes (Signed)
Back from lumbar puncture, flat on bed for 4 hours till 6 pm. Denied any discomfort site dry and intact.
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
## 2016-12-31 NOTE — Progress Notes (Signed)
NIF -35 VC 3L, great patient effort with no distress. RT will continue to monitor.

## 2016-12-31 NOTE — Progress Notes (Signed)
[place] TEAM 1 - Stepdown/ICU TEAM  Kenneth Pace  HEN:[id number] DOB: 27-Nov-1950 DOA: 12/29/2016 PCP: Renato Shin, MD    Brief Narrative:  66 y.o. male with history of subacute inflammatory polyradiculoneuropathy, DM on insulin pump, HTN, sleep apnea on CPAP, HLD, depression, and BPH who presented with weakness in his extremities and shortness of breath.  The patient was hospitalized from 10/5-10/9 due to progressive paresthesias and weakness of hands and legs. 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 He could walk using a walker at discharge. His symptoms returned over a 2 week period, with weakness in all extremities, worse in both legs. He noted decreased sensation and numbness in his forearms and lower extremeties bilaterally. He lost the ability to walk. When he began to develop SOB he presented to the ED for evaluation.    Subjective: Resting comfortably in bed.  No significant change in weakness.  Denies shortness of breath at this time.  Denies chest pain nausea vomiting or abdominal pain.  Remains alert and oriented x4.  Assessment & Plan:  Progressive spastic quadriparesis - demyelinating neuropathy  Neurology directing care of this issue   Diabetes mellitus A1c 7.3 on 11/20/16 - CBG well controlled w/ pt utilizing home insulin pump/insulin pump order set  Dyslipidemia Hold lipid meds until myeloneuropathy better understood   Mild hypokalemia Corrected   HTN BP reasonably controlled - follow w/o change in tx plan today   BPH Continue Proscar  Depression Stable  DVT prophylaxis: SCDs until clear if procedures will be required  Code Status: FULL CODE Family Communication: no family present at time of exam  Disposition Plan: SDU  Consultants:  Neurology   Procedures: none  Antimicrobials:  none   Objective: Blood pressure (!) 141/90, pulse 73, temperature 98.1 F (36.7 C), temperature  source Oral, resp. rate 20, height 6' (1.829 m), weight 103.1 kg (227 lb 6.4 oz), SpO2 98 %.  Intake/Output Summary (Last 24 hours) at 12/31/2016 0851 Last data filed at 12/31/2016 0200 Gross per 24 hour  Intake 240 ml  Output 2250 ml  Net -2010 ml   Filed Weights   12/29/16 2204 12/30/16 1400  Weight: 108 kg (238 lb) 103.1 kg (227 lb 6.4 oz)    Examination: General: No acute respiratory distress Lungs: Clear to auscultation bilaterally without wheezes or crackles Cardiovascular: Regular rate and rhythm without murmur gallop or rub normal S1 and S2 Abdomen: Nontender, nondistended, soft, bowel sounds positive, no rebound, no ascites, no appreciable mass Extremities: No significant cyanosis, clubbing, or edema bilateral lower extremities   CBC: Recent Labs  Lab 12/29/16 2207 12/30/16 0311 12/31/16 0434  WBC 8.5 8.7 8.2  HGB 16.8 15.7 15.7  HCT 47.2 43.8 45.4  MCV 86.8 86.6 87.5  PLT 195 157 361   Basic Metabolic Panel: Recent Labs  Lab 12/29/16 2207 12/30/16 0311 12/31/16 0434  NA 136 137 138  K 3.6 3.3* 3.8  CL 101 105 106  CO2 26 27 26   GLUCOSE 118* 70 141*  BUN 21* 23* 15  CREATININE 0.90 0.92 0.82  CALCIUM 9.2 9.0 8.9  MG  --   --  2.0  PHOS  --   --  3.6   GFR: Estimated Creatinine Clearance: 113 mL/min (by C-G formula based on SCr of 0.82 mg/dL).  Liver Function Tests: Recent Labs  Lab 12/31/16 0434  AST 26  ALT 27  ALKPHOS 152*  BILITOT 0.7  PROT 6.6  ALBUMIN 3.5    Coagulation Profile: Recent Labs  Lab 12/29/16 2207  INR 1.04    Cardiac Enzymes: Recent Labs  Lab 12/30/16 0311  CKTOTAL 223    HbA1C: Hemoglobin A1C  Date/Time Value Ref Range Status  10/30/2016 08:56 AM 7.2  Final  08/12/2016 10:15 AM 7.3  Final   Hgb A1c MFr Bld  Date/Time Value Ref Range Status  11/20/2016 06:57 PM 7.3 (H) 4.8 - 5.6 % Final    Comment:    (NOTE) Pre diabetes:          5.7%-6.4% Diabetes:              >6.4% Glycemic control for    <7.0% adults with diabetes   08/22/2014 08:21 AM 8.4 (H) 4.6 - 6.5 % Final    Comment:    Glycemic Control Guidelines for People with Diabetes:Non Diabetic:  <6%Goal of Therapy: <7%Additional Action Suggested:  >8%     CBG: Recent Labs  Lab 12/30/16 1556 12/30/16 1931 12/30/16 2124 12/31/16 0236 12/31/16 0728  GLUCAP 108* 122* 62* 172* 140*     Scheduled Meds: . busPIRone  30 mg Oral BID  . ezetimibe  10 mg Oral Daily  . finasteride  5 mg Oral Daily  . lisinopril  10 mg Oral Daily   And  . hydrochlorothiazide  12.5 mg Oral Daily  . insulin pump   Subcutaneous TID AC, HS, 0200  . multivitamin with minerals  1 tablet Oral Daily  . rosuvastatin  40 mg Oral Daily  . vitamin B-12  1,000 mcg Oral Daily     LOS: 1 day    Cherene Altes, MD [facility] Office  [phone] Pager - Text Page per Amion as per below:  On-Call/Text Page:      [email or web address]      password [email or web address]  If 7PM-7AM, please contact night-coverage [email or web address] Password [email or web address] 12/31/2016, 8:51 AM

## 2016-12-31 NOTE — Progress Notes (Signed)
Transported to radiology by bed.

## 2016-12-31 NOTE — Consult Note (Signed)
Physical Medicine and Rehabilitation Consult   Reason for Consult: Functional deficits in mobility and ADLs Referring Physician: Dr. Thereasa Solo    HPI: Kenneth Pace is a 66 y.o. 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  Patient recently returned from cruise --used electric scooter to get around. He was readmitted on 12/29/16 with reports of progressive decline that started during his trip with tightness around abdomen with difficulty breathing, progressive difficulty walking with numbness,  difficulty swallowing and inability to void. 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  Dr. Rory Percy recommended evaluation for polyradiculoneuropathy v/s transverse myelitis--doubt GBS. MRI thoracic spine revealed multifocal disc protrusions with cord flattening T5-T10 without significant stenosis. Patient with tetraplegia and neuro felt exam due to myeloneuropathy.    Work up underway to rule out paraneoplastic panel.  LP done yesterday revealing elevated protein 121 and WBC - 7. MRI brain/cervical spine repeated and was negative for acute intracranial abnormality and multilevel moderate stenosis C3/4 and C5/6 with mild cord deformity but no signal changes. CT chest/abdomen pending. Wife reports  plans on starting plasmapheresis soon. Therapy evaluations completed today and CIR recommended due to functional deficits.    Review of Systems  HENT: Negative for hearing loss and tinnitus.   Eyes: Negative for blurred vision and double vision.  Respiratory:       Has difficulty breathing.   Cardiovascular: Negative for chest pain and palpitations.  Gastrointestinal: Positive for constipation. Negative for abdominal pain, heartburn and nausea.  Genitourinary: Negative for dysuria and urgency.       Difficulty voiding  Musculoskeletal:  Negative for myalgias.  Skin: Negative for itching and rash.  Neurological: Positive for sensory change, focal weakness, seizures and weakness (has good days and bad days). Negative for dizziness and headaches.  Psychiatric/Behavioral: Negative for hallucinations. The patient is not nervous/anxious and does not have insomnia.     Past Medical History:  Diagnosis Date  . ADENOIDECTOMY, HX OF 02/16/2007  . ANXIETY 02/16/2007  . BENIGN PROSTATIC HYPERTROPHY, WITH OBSTRUCTION 02/03/2010  . DIABETES MELLITUS, TYPE I, UNCONTROLLED 12/29/2006  . ERECTILE DYSFUNCTION 02/16/2007  . HYPERLIPIDEMIA 02/16/2007  . HYPERTENSION 02/16/2007  . TESTICULAR MASS, LEFT 02/16/2007    Past Surgical History:  Procedure Laterality Date  . KNEE SURGERY    . KNEE SURGERY Right 11/12/2016  . TONSILLECTOMY      Family History  Problem Relation Age of Onset  . Dementia Mother   . Diabetes Mellitus I Mother   . Hypertension Father        82  . Healthy Sister   . Rheum arthritis Brother   . 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 He was working for [employer] until knee arthroscopy in August. He has been using walker/scooter to get around since October. He  reports that  has never smoked. he has never used smokeless tobacco. He reports that he does not drink alcohol or use drugs.    Allergies: No Known Allergies    Medications Prior to Admission  Medication Sig Dispense Refill  . acetaminophen (TYLENOL) 325 MG tablet Take 2 tablets (650 mg total) by mouth every 6 (six) hours as needed for mild pain or headache.    . busPIRone (BUSPAR) 30 MG tablet TAKE ONE-HALF (1/2) TABLET (15 MG) TWICE A DAY (  Patient taking differently: 30MG BY MOUTH AT BEDTIME) 180 tablet 1  . co-enzyme Q-10 30 MG capsule Take 30 mg by mouth daily.      . Continuous Glucose Monitor Sup (ENLITE GLUCOSE SENSOR) MISC 1 Device by Does not apply route every 3 (three) days. 30 each 3  .  Cranberry 400 MG TABS Take 2 each by mouth daily.      . Cyanocobalamin (VITAMIN B-12 CR PO) Take 1 each by mouth daily.      Marland Kitchen ezetimibe (ZETIA) 10 MG tablet TAKE 1 TABLET DAILY 90 tablet 3  . finasteride (PROSCAR) 5 MG tablet Take 5 mg by mouth daily.     Marland Kitchen glucagon 1 MG injection Inject 1 mg into the vein once as needed. (Patient taking differently: Inject 1 mg once as needed into the vein (severe hypoglycemia). ) 1 each 12  . glucose blood (BAYER CONTOUR NEXT TEST) test strip Use to check blood sugar 5 times per day dx 250.01, 500 each 3  . HUMALOG 100 UNIT/ML injection INJECT 120 UNITS DAILY IN PUMP 100 mL 11  . Insulin Infusion Pump Supplies (PARADIGM RESERVOIR 3ML) MISC 1 Device by Does not apply route every 3 (three) days. 30 each 3  . Insulin Infusion Pump Supplies (QUICK-SET INFUSION 43" 9MM) MISC 1 Device by Does not apply route every 3 (three) days. 30 each 3  . Lancets Misc. (ACCU-CHEK MULTICLIX LANCET DEV) KIT 1 Device by Other route 5 (five) times daily as needed. 5/day dx 250.01 450 each 3  . lisinopril-hydrochlorothiazide (PRINZIDE,ZESTORETIC) 10-12.5 MG tablet TAKE 1 TABLET DAILY 90 tablet 1  . Misc. Devices MISC IV 3000 infusion set cover.  Change every 3 days 30 each 3  . Multiple Vitamins-Minerals (MULTIVITAMIN,TX-MINERALS) tablet Take 1 tablet by mouth daily.      . rosuvastatin (CRESTOR) 40 MG tablet TAKE 1 TABLET DAILY 90 tablet 3    Home: Home Living Family/patient expects to be discharged to:: Inpatient rehab Living Arrangements: Spouse/significant other Available Help at Discharge: Family, Available 24 hours/day Type of Home: House Home Access: Stairs to enter [employer] of Steps: 3 Entrance Stairs-Rails: None Home Layout: Two level, Bed/bath upstairs, Able to live on main level with bedroom/bathroom Bathroom Shower/Tub: Chiropodist: Standard Home Equipment: [place] - single point, Crutches, Environmental consultant - 2 wheels, Walker - 4 wheels,  Electric scooter Additional Comments: Reports he has not been going upstairs as it has become too difficult.   Functional History: Prior Function Level of Independence: Needs assistance Gait / Transfers Assistance Needed: Reports he has been requiring increased assist for gait with RW and has had a fall secondary to weakness.  ADL's / Homemaking Assistance Needed: Has been sponge bathing with assist from his wife. Reports increasing difficulty with dressing.  Functional Status:  Mobility: Bed Mobility Overal bed mobility: Needs Assistance Bed Mobility: Supine to Sit, Sit to Supine Supine to sit: Min assist Sit to supine: Min guard General bed mobility comments: MinA for HHA to assist trunk into elevation; no assist needed for returning to supine Transfers Overall transfer level: Needs assistance Equipment used: Rolling walker (2 wheeled) Transfers: Sit to/from Stand Sit to Stand: Min guard, Min assist General transfer comment: Stood from bed with RW minA to assist trunk elevation and cue bilat hip ext. Stood from raised toilet with RW and min guard for balance. Cues for hand placement on RW Ambulation/Gait Ambulation/Gait assistance: Min guard Ambulation Distance (Feet): 30 Feet Assistive device: Rolling walker (2 wheeled)  Gait Pattern/deviations: Step-through pattern, Decreased stride length, Wide base of support, Decreased dorsiflexion - right, Decreased dorsiflexion - left General Gait Details: Amb to/from bathroom with RW and min guard for balance secondary to BLE instability; mild instability with increased steppage due to decreased ankle DF. Further mobility limited secondary to arrival of transport for lumbar puncture.  Gait velocity: Decreased Gait velocity interpretation: <1.8 ft/sec, indicative of risk for recurrent falls    ADL: ADL Overall ADL's : Needs assistance/impaired Eating/Feeding: Bed level, Supervision/ safety Eating/Feeding Details (indicate cue type and  reason): with built-up handles Grooming: Supervision/safety, Bed level Grooming Details (indicate cue type and reason): with built-up handles Upper Body Bathing: Bed level, Moderate assistance Lower Body Bathing: Maximal assistance, Bed level Upper Body Dressing : Bed level, Moderate assistance Lower Body Dressing: Maximal assistance, Bed level General ADL Comments: Session focused on self-feeding with built-up handles. Will cotninue to assess mobility.   Cognition: Cognition Overall Cognitive Status: Within Functional Limits for tasks assessed Orientation Level: Oriented X4 Cognition Arousal/Alertness: Awake/alert Behavior During Therapy: WFL for tasks assessed/performed Overall Cognitive Status: Within Functional Limits for tasks assessed  Blood pressure 121/75, pulse 80, temperature (!) 96.7 F (35.9 C), temperature source Axillary, resp. rate 19, height 6' (1.829 m), weight 103.1 kg (227 lb 6.4 oz), SpO2 96 %. Physical Exam  Nursing note and vitals reviewed. Constitutional: He is oriented to person, place, and time. He appears well-developed and well-nourished.  HENT:  Head: Normocephalic and atraumatic.  Mouth/Throat: Oropharynx is clear and moist.  Eyes: Conjunctivae are normal. Pupils are equal, round, and reactive to light.  Neck: Normal range of motion. Neck supple.  Cardiovascular: Normal rate and regular rhythm.  Respiratory: Effort normal and breath sounds normal. No stridor.  GI: Soft. Bowel sounds are normal. He exhibits no distension. There is no tenderness.  Musculoskeletal: He exhibits no edema or tenderness.  Neurological: He is alert and oriented to person, place, and time.  Upper extremity motor exam grossly 4 out of 5 proximal distal.  He does have stocking glove sensory loss from the hands to mid forearms.  He also has sensory loss in the trunk and lower extremities with sensation being most impaired distally.  Hip flexors are 1+ to 2 out of 5 as are his knee  extensors.  Ankle dorsiflexion and plantar flexion is 3 to 3+ out of 5.  Patellar tendon reflexes are 3+.  Reflexes in the upper extremities are 1-2+.  No bulbar signs  Skin: Skin is warm and dry.  Psychiatric: He has a normal mood and affect. His behavior is normal. Judgment and thought content normal.    Results for orders placed or performed during the hospital encounter of 12/29/16 (from the past 24 hour(s))  Glucose, capillary     Status: Abnormal   Collection Time: 12/31/16 11:48 AM  Result Value Ref Range   Glucose-Capillary 129 (H) 65 - 99 mg/dL  Glucose, CSF     Status: None   Collection Time: 12/31/16  1:58 PM  Result Value Ref Range   Glucose, CSF 64 40 - 70 mg/dL  Protein, CSF     Status: Abnormal   Collection Time: 12/31/16  1:58 PM  Result Value Ref Range   Total  Protein, CSF 121 (H) 15 - 45 mg/dL  CSF cell count with differential     Status: Abnormal   Collection Time: 12/31/16  1:58 PM  Result Value Ref Range   Tube # 3    Color, CSF COLORLESS  COLORLESS   Appearance, CSF CLEAR CLEAR   Supernatant NOT INDICATED    RBC Count, CSF 1 (H) 0 /cu mm   WBC, CSF 7 (H) 0 - 5 /cu mm   Other Cells, CSF TOO FEW TO COUNT, SMEAR AVAILABLE FOR REVIEW   Glucose, capillary     Status: Abnormal   Collection Time: 12/31/16  4:42 PM  Result Value Ref Range   Glucose-Capillary 243 (H) 65 - 99 mg/dL  Glucose, capillary     Status: Abnormal   Collection Time: 12/31/16  9:46 PM  Result Value Ref Range   Glucose-Capillary 318 (H) 65 - 99 mg/dL  Glucose, capillary     Status: Abnormal   Collection Time: 01/01/17  7:54 AM  Result Value Ref Range   Glucose-Capillary 40 (LL) 65 - 99 mg/dL   Ct Head Wo Contrast  Result Date: 12/30/2016 CLINICAL DATA:  Subacute neurologic deficits weakness in both extremities. History of subacute inflammatory poly radiculopathy. Shortness of breath for 1 day. EXAM: CT HEAD WITHOUT CONTRAST TECHNIQUE: Contiguous axial images were obtained from the base of  the skull through the vertex without intravenous contrast. COMPARISON:  None available FINDINGS: Brain: No evidence of acute infarction, hemorrhage, hydrocephalus, extra-axial collection or mass lesion/mass effect. Vascular: Retro calcification.  No hyperdense vessel. Skull: Right parietal scalp soft tissue density without subjacent bony change, morphology and reformats favoring scar. No osseous abnormality. Sinuses/Orbits: Negative IMPRESSION: No explanation for weakness. Electronically Signed   By: Monte Fantasia M.D.   On: 12/30/2016 11:02   Mr Brain Wo Contrast  Result Date: 12/31/2016 CLINICAL DATA:  Inflammatory polyneuropathy EXAM: MRI HEAD WITHOUT CONTRAST MRI CERVICAL SPINE WITHOUT CONTRAST TECHNIQUE: Multiplanar, multiecho pulse sequences of the brain and surrounding structures, and cervical spine, to include the craniocervical junction and cervicothoracic junction, were obtained without intravenous contrast. COMPARISON:  Head CT 12/30/2016 FINDINGS: MRI HEAD FINDINGS Brain: The midline structures are normal. There is no acute infarct or acute hemorrhage. No mass lesion, hydrocephalus, dural abnormality or extra-axial collection. The brain parenchymal signal is normal. No age-advanced or lobar predominant atrophy. No chronic microhemorrhage or superficial siderosis. High-resolution T2-weighted imaging was not performed, but there is no visible abnormality of the cranial nerves. Vascular: Major intracranial arterial and venous sinus flow voids are preserved. Skull and upper cervical spine: The visualized skull base, calvarium, upper cervical spine and extracranial soft tissues are normal. Sinuses/Orbits: The paranasal sinuses are clear and there is no mastoid or middle ear effusion. Normal orbits. Other: None. MRI CERVICAL SPINE FINDINGS Alignment: Straightening of the normal cervical lordosis. Vertebrae: No acute compression fracture, discitis-osteomyelitis, facet edema or other focal marrow lesion.  No epidural collection. Cord: Normal caliber and signal. Posterior Fossa, vertebral arteries, paraspinal tissues: No prevertebral soft tissue swelling. Normal vertebral and carotid artery flow voids. Disc levels: C1-C2: Normal. C2-C3: Normal disc space and facets. No spinal canal or neuroforaminal stenosis. C3-C4: Central disc protrusion indents the ventral spinal cord without causing signal change. There is moderate spinal canal stenosis and moderate right neural foraminal stenosis. C4-C5: Medium-sized disc bulge effaces the ventral thecal sac and deforms the spinal cord, resulting and moderate spinal canal stenosis. No cord signal change. Moderate bilateral foraminal stenosis. C5-C6: Bilateral uncovertebral hypertrophy with small central disc protrusion. There is mild spinal canal stenosis with narrowing of the ventral thecal sac and mild flattening of the anterior spinal cord. There is severe bilateral neural foraminal stenosis. C6-C7: Small disc osteophyte complex without spinal canal stenosis. No neural foraminal stenosis.  C7-T1: Normal disc space and facets. No spinal canal or neuroforaminal stenosis. There is no enlargement of the cervical nerve roots. IMPRESSION: 1. No enlargement of the cranial nerves or cervical nerve roots. 2. No acute intracranial abnormality. 3. Multilevel moderate cervical spinal canal stenosis, worst at C3-4 and C5-6 with associated mild cord deformity but no cord signal change. 4. Moderate to severe neural foraminal stenosis at C3-4, C4-5 and C5-6. Electronically Signed   By: Ulyses Jarred M.D.   On: 12/31/2016 22:10   Mr Cervical Spine Wo Contrast  Result Date: 12/31/2016 CLINICAL DATA:  Inflammatory polyneuropathy EXAM: MRI HEAD WITHOUT CONTRAST MRI CERVICAL SPINE WITHOUT CONTRAST TECHNIQUE: Multiplanar, multiecho pulse sequences of the brain and surrounding structures, and cervical spine, to include the craniocervical junction and cervicothoracic junction, were obtained  without intravenous contrast. COMPARISON:  Head CT 12/30/2016 FINDINGS: MRI HEAD FINDINGS Brain: The midline structures are normal. There is no acute infarct or acute hemorrhage. No mass lesion, hydrocephalus, dural abnormality or extra-axial collection. The brain parenchymal signal is normal. No age-advanced or lobar predominant atrophy. No chronic microhemorrhage or superficial siderosis. High-resolution T2-weighted imaging was not performed, but there is no visible abnormality of the cranial nerves. Vascular: Major intracranial arterial and venous sinus flow voids are preserved. Skull and upper cervical spine: The visualized skull base, calvarium, upper cervical spine and extracranial soft tissues are normal. Sinuses/Orbits: The paranasal sinuses are clear and there is no mastoid or middle ear effusion. Normal orbits. Other: None. MRI CERVICAL SPINE FINDINGS Alignment: Straightening of the normal cervical lordosis. Vertebrae: No acute compression fracture, discitis-osteomyelitis, facet edema or other focal marrow lesion. No epidural collection. Cord: Normal caliber and signal. Posterior Fossa, vertebral arteries, paraspinal tissues: No prevertebral soft tissue swelling. Normal vertebral and carotid artery flow voids. Disc levels: C1-C2: Normal. C2-C3: Normal disc space and facets. No spinal canal or neuroforaminal stenosis. C3-C4: Central disc protrusion indents the ventral spinal cord without causing signal change. There is moderate spinal canal stenosis and moderate right neural foraminal stenosis. C4-C5: Medium-sized disc bulge effaces the ventral thecal sac and deforms the spinal cord, resulting and moderate spinal canal stenosis. No cord signal change. Moderate bilateral foraminal stenosis. C5-C6: Bilateral uncovertebral hypertrophy with small central disc protrusion. There is mild spinal canal stenosis with narrowing of the ventral thecal sac and mild flattening of the anterior spinal cord. There is severe  bilateral neural foraminal stenosis. C6-C7: Small disc osteophyte complex without spinal canal stenosis. No neural foraminal stenosis. C7-T1: Normal disc space and facets. No spinal canal or neuroforaminal stenosis. There is no enlargement of the cervical nerve roots. IMPRESSION: 1. No enlargement of the cranial nerves or cervical nerve roots. 2. No acute intracranial abnormality. 3. Multilevel moderate cervical spinal canal stenosis, worst at C3-4 and C5-6 with associated mild cord deformity but no cord signal change. 4. Moderate to severe neural foraminal stenosis at C3-4, C4-5 and C5-6. Electronically Signed   By: Ulyses Jarred M.D.   On: 12/31/2016 22:10   Dg Fluoro Guide Lumbar Puncture  Result Date: 12/31/2016 CLINICAL DATA:  Neuropathy EXAM: DIAGNOSTIC LUMBAR PUNCTURE UNDER FLUOROSCOPIC GUIDANCE FLUOROSCOPY TIME:  Fluoroscopy Time:  0.5 minute Radiation Exposure Index (if provided by the fluoroscopic device): 3.2 mGy Number of Acquired Spot Images: 0 PROCEDURE: Informed consent was obtained from the patient prior to the procedure, including potential complications of headache, allergy, and pain. With the patient prone, the lower back was prepped with Betadine. 1% Lidocaine was used for local anesthesia. Lumbar puncture was performed  at the L4-5 level using a 22 gauge needle with return of clear CSF. 10 ml of CSF were obtained for laboratory studies. The patient tolerated the procedure well and there were no apparent complications. IMPRESSION: Successful fluoroscopic guided lumbar puncture. 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 Does the need for close, 24 hr/day medical supervision in concert with the patient's rehab needs make it unreasonable for this patient to be served in a less intensive setting? Yes 2. Co-Morbidities  requiring supervision/potential complications: Diabetes, hypertension, neurogenic bowel and bladder 3. Due to bladder management, bowel management, safety, skin/wound care, disease management, medication administration, pain management and patient education, does the patient require 24 hr/day rehab nursing? Yes 4. Does the patient require coordinated care of a physician, rehab nurse, PT (1-2 hrs/day, 5 days/week) and OT (1-2 hrs/day, 5 days/week) to address physical and functional deficits in the context of the above medical diagnosis(es)? Yes Addressing deficits in the following areas: balance, endurance, locomotion, strength, transferring, bowel/bladder control, bathing, dressing, feeding, grooming, toileting and psychosocial support 5. Can the patient actively participate in an intensive therapy program of at least 3 hrs of therapy per day at least 5 days per week? Yes 6. The potential for patient to make measurable gains while on inpatient rehab is excellent 7. Anticipated functional outcomes upon discharge from inpatient rehab are modified independent and supervision  with PT, modified independent and supervision with OT, n/a with SLP. 8. Estimated rehab length of stay to reach the above functional goals is: 11-17 days 9. Anticipated D/C setting: Home 10. Anticipated post D/C treatments: HH therapy and Outpatient therapy 11. Overall Rehab/Functional Prognosis: good  RECOMMENDATIONS: This patient's condition is appropriate for continued rehabilitative care in the following setting: CIR Patient has agreed to participate in recommended program. Yes Note that insurance prior authorization may be required for reimbursement for recommended care.  Comment: We will follow along as the diagnostic workup and treatment continues.  Meredith Staggers, MD, [facility] 01/01/2017    Bary Leriche, PA-C 01/01/2017

## 2016-12-31 NOTE — Progress Notes (Signed)
Inpatient Rehabilitation  Per, PT request patient was screened by Gunnar Fusi for appropriateness for an Inpatient Acute Rehab consult.  At this time we are recommending an Inpatient Rehab consult.  Text paged MD to notify; please order if you are agreeable.    Carmelia Roller., CCC/SLP Admission Coordinator  [place]  Cell [phone]

## 2016-12-31 NOTE — Progress Notes (Signed)
Pt. Claimed that reason why his cbg->200 because insulin pump disconnected accidentally when lumbar tap was done in the IR. Wife at bedside changing the insulin pump.

## 2017-01-01 ENCOUNTER — Inpatient Hospital Stay (HOSPITAL_COMMUNITY): Payer: BLUE CROSS/BLUE SHIELD

## 2017-01-01 DIAGNOSIS — G629 Polyneuropathy, unspecified: Secondary | ICD-10-CM

## 2017-01-01 DIAGNOSIS — G8250 Quadriplegia, unspecified: Secondary | ICD-10-CM

## 2017-01-01 DIAGNOSIS — E11649 Type 2 diabetes mellitus with hypoglycemia without coma: Secondary | ICD-10-CM

## 2017-01-01 DIAGNOSIS — Z794 Long term (current) use of insulin: Secondary | ICD-10-CM

## 2017-01-01 LAB — MISC LABCORP TEST (SEND OUT): Labcorp test code: 9985

## 2017-01-01 LAB — ZINC: Zinc: 57 ug/dL (ref 56–134)

## 2017-01-01 LAB — GLUCOSE, CAPILLARY
GLUCOSE-CAPILLARY: 40 mg/dL — AB (ref 65–99)
GLUCOSE-CAPILLARY: 177 mg/dL — AB (ref 65–99)
GLUCOSE-CAPILLARY: 161 mg/dL — AB (ref 65–99)
Glucose-Capillary: 173 mg/dL — ABNORMAL HIGH (ref 65–99)

## 2017-01-01 LAB — ANGIOTENSIN CONVERTING ENZYME, CSF: Angio Convert Enzyme: 0 U/L (ref 0.0–2.8)

## 2017-01-01 LAB — VITAMIN A: Vitamin A (Retinoic Acid): 42.2 ug/dL (ref 36.4–108.0)

## 2017-01-01 LAB — VITAMIN E
VITAMIN E(GAMMA TOCOPHEROL): 0.5 mg/L (ref 0.5–4.9)
Vitamin E (Alpha Tocopherol): 7.4 mg/L — ABNORMAL LOW (ref 9.0–29.0)

## 2017-01-01 LAB — COPPER, SERUM: Copper: 91 ug/dL (ref 72–166)

## 2017-01-01 MED ORDER — HEPARIN SODIUM (PORCINE) 1000 UNIT/ML IJ SOLN
3000.0000 [IU] | Freq: Once | INTRAMUSCULAR | Status: AC
  Administered 2017-01-01: 3000 [IU] via INTRAVENOUS
  Filled 2017-01-01: qty 3

## 2017-01-01 MED ORDER — DIPHENHYDRAMINE HCL 25 MG PO CAPS: 25.0000 mg | ORAL_CAPSULE | Freq: Four times a day (QID) | ORAL | Status: DC | PRN

## 2017-01-01 MED ORDER — ACETAMINOPHEN 325 MG PO TABS
650.0000 mg | ORAL_TABLET | ORAL | Status: DC | PRN
  Administered 2017-01-01 – 2017-01-02 (×2): 650 mg via ORAL
  Filled 2017-01-01: qty 2

## 2017-01-01 MED ORDER — SODIUM CHLORIDE 0.9 % IV SOLN
2.0000 g | Freq: Once | INTRAVENOUS | Status: DC
  Filled 2017-01-01: qty 20

## 2017-01-01 MED ORDER — CALCIUM CARBONATE ANTACID 500 MG PO CHEW
2.0000 | CHEWABLE_TABLET | ORAL | Status: AC
  Filled 2017-01-01: qty 2

## 2017-01-01 MED ORDER — ACD FORMULA A 0.73-2.45-2.2 GM/100ML VI SOLN
500.0000 mL | Status: DC
  Filled 2017-01-01: qty 500

## 2017-01-01 MED ORDER — IOPAMIDOL (ISOVUE-300) INJECTION 61%
INTRAVENOUS | Status: AC
  Administered 2017-01-01: 100 mL
  Filled 2017-01-01: qty 100

## 2017-01-01 MED ORDER — SODIUM CHLORIDE 0.9 % IV SOLN
Freq: Once | INTRAVENOUS | Status: AC
  Administered 2017-01-02: 16:00:00 via INTRAVENOUS_CENTRAL
  Filled 2017-01-01: qty 200

## 2017-01-01 MED ORDER — HEPARIN SODIUM (PORCINE) 1000 UNIT/ML IJ SOLN: 1000.0000 [IU] | Freq: Once | INTRAMUSCULAR | Status: DC

## 2017-01-01 MED ORDER — IOPAMIDOL (ISOVUE-370) INJECTION 76%
INTRAVENOUS | Status: AC
  Filled 2017-01-01: qty 100

## 2017-01-01 MED ORDER — IOPAMIDOL (ISOVUE-300) INJECTION 61%
INTRAVENOUS | Status: AC
Start: 2017-01-01 — End: 2017-01-01
  Filled 2017-01-01: qty 30

## 2017-01-01 NOTE — Procedures (Signed)
Central Venous dialysis Catheter Insertion Procedure Note Kenneth Pace [id number] [birth year]  Procedure: Insertion of Central Venous Catheter Indications: plasmaphoresis   Procedure Details Consent: Risks of procedure as well as the alternatives and risks of each were explained to the (patient/caregiver).  Consent for procedure obtained. Time Out: Verified patient identification, verified procedure, site/side was marked, verified correct patient position, special equipment/implants available, medications/allergies/relevent history reviewed, required imaging and test results available.  Performed Real time [place] was used to ID and cannulate vessel  Maximum sterile technique was used including antiseptics, cap, gloves, gown, hand hygiene, mask and sheet. Skin prep: Chlorhexidine; local anesthetic administered A antimicrobial bonded/coated triple lumen catheter was placed in the right internal jugular vein using the [place] technique.  Evaluation Blood flow good Complications: No apparent complications Patient did tolerate procedure well. Chest X-ray ordered to verify placement.  CXR: pending.  [name] 01/01/2017, 4:15 PM  Erick Colace ACNP-BC [place] Pager # [phone] OR # [phone] if no answer

## 2017-01-01 NOTE — Progress Notes (Signed)
Kenneth Pace  HYQ:[id number] DOB: 21-Aug-1950 DOA: 12/29/2016 PCP: Renato Shin, MD    Brief Narrative:  66 y.o. male with history of subacute inflammatory polyradiculoneuropathy, DM on insulin pump, HTN, sleep apnea on CPAP, HLD, depression, and BPH who presented with weakness in his extremities and shortness of breath.  The patient was hospitalized from 10/5-10/9 due to progressive paresthesias and weakness of hands and legs. 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 He could walk using a walker at discharge. His symptoms returned over a 2 week period, with weakness in all extremities, worse in both legs. He noted decreased sensation and numbness in his forearms and lower extremeties bilaterally. He lost the ability to walk. When he began to develop SOB he presented to the ED for evaluation.    Subjective: Patient interviewed and examined this morning. He was talking to his spouse on the phone and handed me the phone to talk to her. He was also checking his own blood sugar using his glucometer. he reports lack of dexterity in his hands with associated weakness. Numbness extending from elbow distally. Also complains of numbness in his feet. Weaker proximally in his legs. Noted hypoglycemic range blood sugars this morning. As per patient and spouse, likely related to giving more insulin last night after it had been interrupted due to dislodged line. No pain reported. Denies dyspnea.  Assessment & Plan:  Progressive spastic quadriparesis - demyelinating neuropathy  Neurology consultation and follow-up appreciated. I discussed in detail with Dr. 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 MRI of brain and C-spine findings reviewedin no acute findings noted. CSF looks unremarkable. As per neurology, plan for placement of PICC line for plasmapheresis today and getting CT  of the chest and abdomen to rule out paraneoplastic process. Neurology has sent for extensive workup including vitamin levels, heavy metals, autoimmune workup-pending.  Diabetes mellitus A1c 7.3 on 11/20/16 - CBG was well controlled until last night whenmarked hyperglycemia and as per report from patient/spouse and RN, insulin pump was dislodged/interrupted and was subsequently resumed. This morning hypoglycemic in the 40s but not very symptomatic. He indicates that he must have given himselfincreased dose of insulin. Treat per protocol and monitor closely. Offered diabetes coordinator consultation but patient and spouse declined indicating that he has done this for 20 years and probably knows it better than the diabetes coordinators.  Dyslipidemia Hold lipid meds until myeloneuropathy better understood   Mild hypokalemia Corrected   HTN BP reasonably controlled - follow w/o change in tx plan today   BPH Continue Proscar  Depression Stable  DVT prophylaxis: SCDs until clear if procedures will be required  Code Status: FULL CODE Family Communication: discussed in detail with patient's spouse via phone. Updated care and answered questions. Disposition Plan: SDU  Consultants:  Neurology   Procedures: Lumbar puncture 11/15.  Antimicrobials:  None   Objective:  Vitals:   12/31/16 2322 01/01/17 0341 01/01/17 0730 01/01/17 0733  BP: 123/67 121/75  128/72  Pulse:   80   Resp: 20 15 19 20   Temp: 98.1 F (36.7 C) 97.6 F (36.4 C)  (!) 96.7 F (35.9 C)  TempSrc: Oral Oral  Axillary  SpO2:  93% 96% 97%  Weight:      Height:         Intake/Output Summary (Last 24 hours) at 01/01/2017 1059 Last data filed at 01/01/2017 0800 Gross per 24  hour  Intake 120 ml  Output 2475 ml  Net -2355 ml   Filed Weights   12/29/16 2204 12/30/16 1400  Weight: 108 kg (238 lb) 103.1 kg (227 lb 6.4 oz)    Examination: General: No acute respiratory distress. Middle-aged male,  moderately built and nourished, lying comfortably propped up in bed. Lungs: Clear to auscultation bilaterally without wheezes or crackles. No increased work of breathing. Cardiovascular: Regular rate and rhythm without murmur gallop or rub normal S1 and S2. Telemetry personally reviewed: Sinus rhythm. Abdomen: Nontender, nondistended, soft, bowel sounds positive, no rebound, no ascites, no appreciable mass Extremities: No significant cyanosis, clubbing, or edema bilateral lower extremities. Grade 5 x 5 power in upper extremities except in her hands when he has decreased grip. Lower extremities: Grade 2-3/5 power proximally in the hips and knees but grade 4 power in the CNS: Alert and oriented. No cranial nerve deficits. Psychiatric: Pleasant and appropriate interaction. In good spirits.  CBC: Recent Labs  Lab 12/29/16 2207 12/30/16 0311 12/31/16 0434  WBC 8.5 8.7 8.2  HGB 16.8 15.7 15.7  HCT 47.2 43.8 45.4  MCV 86.8 86.6 87.5  PLT 195 157 440   Basic Metabolic Panel: Recent Labs  Lab 12/29/16 2207 12/30/16 0311 12/31/16 0434  NA 136 137 138  K 3.6 3.3* 3.8  CL 101 105 106  CO2 26 27 26   GLUCOSE 118* 70 141*  BUN 21* 23* 15  CREATININE 0.90 0.92 0.82  CALCIUM 9.2 9.0 8.9  MG  --   --  2.0  PHOS  --   --  3.6   GFR: Estimated Creatinine Clearance: 113 mL/min (by C-G formula based on SCr of 0.82 mg/dL).  Liver Function Tests: Recent Labs  Lab 12/31/16 0434  AST 26  ALT 27  ALKPHOS 152*  BILITOT 0.7  PROT 6.6  ALBUMIN 3.5    Coagulation Profile: Recent Labs  Lab 12/29/16 2207  INR 1.04    Cardiac Enzymes: Recent Labs  Lab 12/30/16 0311  CKTOTAL 223    HbA1C: Hemoglobin A1C  Date/Time Value Ref Range Status  10/30/2016 08:56 AM 7.2  Final  08/12/2016 10:15 AM 7.3  Final   Hgb A1c MFr Bld  Date/Time Value Ref Range Status  11/20/2016 06:57 PM 7.3 (H) 4.8 - 5.6 % Final    Comment:    (NOTE) Pre diabetes:          5.7%-6.4% Diabetes:               >6.4% Glycemic control for   <7.0% adults with diabetes   08/22/2014 08:21 AM 8.4 (H) 4.6 - 6.5 % Final    Comment:    Glycemic Control Guidelines for People with Diabetes:Non Diabetic:  <6%Goal of Therapy: <7%Additional Action Suggested:  >8%     CBG: Recent Labs  Lab 12/31/16 1148 12/31/16 1642 12/31/16 2146 01/01/17 0754 01/01/17 1029  GLUCAP 129* 243* 318* 40* 177*     Scheduled Meds: . busPIRone  30 mg Oral BID  . finasteride  5 mg Oral Daily  . lisinopril  10 mg Oral Daily   And  . hydrochlorothiazide  12.5 mg Oral Daily  . insulin pump   Subcutaneous TID AC, HS, 0200  . iopamidol      . multivitamin with minerals  1 tablet Oral Daily  . polyethylene glycol  17 g Oral BID  . vitamin B-12  1,000 mcg Oral Daily     LOS: 2 days    Everlene Farrier  Algis Liming, MD, FACP, FHM. [facility] Pager [phone]  If 7PM-7AM, please contact night-coverage [email or web address] Password [email or web address] 01/01/2017, 11:14 AM
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
## 2017-01-01 NOTE — Progress Notes (Signed)
Subjective: He feels better today, states that he has "good days and bad days"  I discussed with his outpatient neuroologist who fel tthat he really had significant improvement(though incomplete and non-sustained) after IVIG.   Exam: Vitals:   01/01/17 0730 01/01/17 0733  BP:    Pulse: 80   Resp: 19   Temp:  (!) 96.7 F (35.9 C)  SpO2: 96%    Gen: In bed, NAD Resp: non-labored breathing, no acute distress Abd: soft, nt  Neuro: MS: awake, alert TJ:QZESP, EOMI Motor: He is able to lift his legs against gravity bilaterally, 4/5 bilateral leg, 4+/5 bilateral arms 4/5 distally.  Sensory:decreased to elbow bialterally DTR: less brisk today, still brisk at the knees, but no clonus.    Pertinent Labs: Vit d - normal  Impression: 66 year old male with progressive weakness, numbness fasciculations, hyperreflexia.  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   I feel that his exam is most consistent with a myeloneuropathy.  With the new fasciculations starting recently, I doubt that the EMG findings were chronic related to diabetes superimposed on just a myelopathy.  With sensory changes, I feel that ALS superimposed on underlying neuropathy is also relatively unlikely.  I have sent labs including  Vitamin A Vitamin D Vitamin K Copper, zinc Serum ACE SSA, SSB, ANA  He does have cervical stenosis and I continue to worry that he might have more than one process going on at this time. I will discuss his case with neurosurgery. If no other cause is identified, I think that plasma exchange would be reasonable.  If this does represent myeloneuropathy,   Recommendations: 1) Will consider PLEX 2) CT C/A/P 3) will follow   Roland Rack, MD [facility] [phone]  If 7pm- 7am, please page neurology on call as listed in [place].
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
## 2017-01-01 NOTE — Progress Notes (Signed)
Hypoglycemic Event  CBG: 40  Treatment: 15 GM carbohydrate snack  Symptoms: None  Follow-up CBG: Time: 830 CBG Result: 52  Possible Reasons for Event: Medication regimen: insulin pump issues  Comments/MD notified: Dr Algis Liming at bedside and is aware of the hypoglycemic event. Provided another 15 GM carb snack. Recheck of blood sugar @ 1030 was 177. Patient  Provided himself with 3 units of insulin at that time. Will continue to monitor.       Karen Kays

## 2017-01-01 NOTE — Progress Notes (Signed)
Inpatient Diabetes Program Recommendations  AACE/ADA: New Consensus Statement on Inpatient Glycemic Control (2015)  Target Ranges:  Prepandial:   less than 140 mg/dL      Peak postprandial:   less than 180 mg/dL (1-2 hours)      Critically ill patients:  140 - 180 mg/dL   Lab Results  Component Value Date   GLUCAP 177 (H) 01/01/2017   HGBA1C 7.3 (H) 11/20/2016    Have seen patient on previous admission. Patient and wife very savvy with the insulin pump. Patient does not become symptomatic from hypoglycemia unless he is below 40. Patient is well versed in hypoglycemia management. Patient reports last admission having glucose in the 50's and 60's occasionally. Have been watching glucose trends since admission. Will continue to follow.  Thanks,  Tama Headings RN, MSN, [facility] Inpatient Diabetes Coordinator Team Pager [phone] (8a-5p)

## 2017-01-01 NOTE — Progress Notes (Signed)
NIF -30  VC 2.5L, good patient effort. Performed both tests x2. No distress or discomfort noticed. RT will continue to monitor.

## 2017-01-02 LAB — CBC WITH DIFFERENTIAL/PLATELET
Basophils Absolute: 0 10*3/uL (ref 0.0–0.1)
Basophils Relative: 0 %
EOS ABS: 0.2 10*3/uL (ref 0.0–0.7)
Eosinophils Relative: 2 %
HEMATOCRIT: 47 % (ref 39.0–52.0)
HEMOGLOBIN: 16.9 g/dL (ref 13.0–17.0)
LYMPHS ABS: 1.8 10*3/uL (ref 0.7–4.0)
LYMPHS PCT: 18 %
MCH: 31.1 pg (ref 26.0–34.0)
MCHC: 36 g/dL (ref 30.0–36.0)
MCV: 86.6 fL (ref 78.0–100.0)
MONOS PCT: 9 %
Monocytes Absolute: 0.9 10*3/uL (ref 0.1–1.0)
NEUTROS ABS: 6.7 10*3/uL (ref 1.7–7.7)
NEUTROS PCT: 71 %
Platelets: 182 10*3/uL (ref 150–400)
RBC: 5.43 MIL/uL (ref 4.22–5.81)
RDW: 12.9 % (ref 11.5–15.5)
WBC: 9.6 10*3/uL (ref 4.0–10.5)

## 2017-01-02 LAB — GLUCOSE, CAPILLARY
GLUCOSE-CAPILLARY: 319 mg/dL — AB (ref 65–99)
Glucose-Capillary: 87 mg/dL (ref 65–99)
Glucose-Capillary: 228 mg/dL — ABNORMAL HIGH (ref 65–99)

## 2017-01-02 MED ORDER — SODIUM CHLORIDE 0.9 % IV SOLN
INTRAVENOUS | Status: AC
  Filled 2017-01-02 (×4): qty 200

## 2017-01-02 MED ORDER — BISACODYL 10 MG RE SUPP
10.0000 mg | Freq: Every day | RECTAL | Status: DC | PRN
  Administered 2017-01-02: 10 mg via RECTAL
  Filled 2017-01-02: qty 1

## 2017-01-02 MED ORDER — ACETAMINOPHEN 325 MG PO TABS: 650.0000 mg | ORAL_TABLET | ORAL | Status: DC | PRN

## 2017-01-02 MED ORDER — CALCIUM CARBONATE ANTACID 500 MG PO CHEW
2.0000 | CHEWABLE_TABLET | ORAL | Status: AC
  Administered 2017-01-02: 400 mg via ORAL

## 2017-01-02 MED ORDER — DIPHENHYDRAMINE HCL 25 MG PO CAPS: 25.0000 mg | ORAL_CAPSULE | Freq: Four times a day (QID) | ORAL | Status: DC | PRN

## 2017-01-02 MED ORDER — SODIUM CHLORIDE 0.9 % IV SOLN
2.0000 g | Freq: Once | INTRAVENOUS | Status: AC
  Administered 2017-01-02: 2 g via INTRAVENOUS
  Filled 2017-01-02 (×3): qty 20

## 2017-01-02 MED ORDER — ACD FORMULA A 0.73-2.45-2.2 GM/100ML VI SOLN: 500.0000 mL | Status: DC

## 2017-01-02 MED ORDER — VITAMIN E 45 MG (100 UNIT) PO CAPS
100.0000 [IU] | ORAL_CAPSULE | Freq: Every day | ORAL | Status: DC
Start: 2017-01-02 — End: 2017-01-11
  Administered 2017-01-03 – 2017-01-10 (×8): 100 [IU] via ORAL
  Filled 2017-01-02 (×11): qty 1

## 2017-01-02 MED ORDER — SODIUM CHLORIDE 0.9 % IV SOLN
INTRAVENOUS | Status: AC
  Administered 2017-01-02 (×2): via INTRAVENOUS_CENTRAL

## 2017-01-02 MED ORDER — CALCIUM CARBONATE ANTACID 500 MG PO CHEW
CHEWABLE_TABLET | ORAL | Status: AC
Start: 2017-01-02 — End: 2017-01-02
  Administered 2017-01-02: 400 mg via ORAL
  Filled 2017-01-02: qty 2

## 2017-01-02 MED ORDER — ACD FORMULA A 0.73-2.45-2.2 GM/100ML VI SOLN
Status: AC
  Administered 2017-01-02: 18:00:00
  Filled 2017-01-02: qty 500

## 2017-01-02 MED ORDER — HEPARIN SODIUM (PORCINE) 1000 UNIT/ML IJ SOLN: 1000.0000 [IU] | Freq: Once | INTRAMUSCULAR | Status: DC

## 2017-01-02 NOTE — Consult Note (Signed)
Chief Complaint   Chief Complaint  Patient presents with  . Shortness of Breath    History of Present Illness  AMEYA KUTZ is a 66 y.o. male who presents to the hospital a few days ago with SOB and difficulty urinating. His history begins at the beginning of October, when he says he actually was walking his dog and tripped and fell.  About a week or 2 later he describes feeling weakness in grip strength in both arms.  He also began to develop difficulty walking.  He was initially seen by Dr. 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  At the time, he was also complaining of numbness and tingling from the elbow down to his hands and both sides.  He was treated initially with a course of IVIG.  Although he states that it did not really improve his condition much, Dr. Leonel Ramsay has spoken with Dr. Posey Pronto who says that he actually did have a reasonable response to the IV Ig.  A few days ago, he was readmitted to the hospital after presenting with subjective shortness of breath, and urinary retention.  He says that when he felt like he had a cough, he did not have the strength to do it.  He also says that he had not peed in about 20 hours, and is found to have a full bladder, with overflow incontinence.  Past Medical History   Past Medical History:  Diagnosis Date  . ADENOIDECTOMY, HX OF 02/16/2007  . ANXIETY 02/16/2007  . BENIGN PROSTATIC HYPERTROPHY, WITH OBSTRUCTION 02/03/2010  . DIABETES MELLITUS, TYPE I, UNCONTROLLED 12/29/2006  . ERECTILE DYSFUNCTION 02/16/2007  . HYPERLIPIDEMIA 02/16/2007  . HYPERTENSION 02/16/2007  . TESTICULAR MASS, LEFT 02/16/2007    Past Surgical History   Past Surgical History:  Procedure Laterality Date  . KNEE SURGERY    . KNEE SURGERY Right 11/12/2016  . TONSILLECTOMY      Social History   Social History   Tobacco Use  . Smoking  status: Never Smoker  . Smokeless tobacco: Never Used  Substance Use Topics  . Alcohol use: No  . Drug use: No    Medications   Prior to Admission medications   Medication Sig Start Date End Date Taking? Authorizing Provider  acetaminophen (TYLENOL) 325 MG tablet Take 2 tablets (650 mg total) by mouth every 6 (six) hours as needed for mild pain or headache. 11/24/16  Yes Mikhail, Clinical biochemist, DO  busPIRone (BUSPAR) 30 MG tablet TAKE ONE-HALF (1/2) TABLET (15 MG) TWICE A DAY Patient taking differently: 30MG BY MOUTH AT BEDTIME 02/28/15  Yes Renato Shin, MD  co-enzyme Q-10 30 MG capsule Take 30 mg by mouth daily.     Yes [provider]  Continuous Glucose Monitor Sup (ENLITE GLUCOSE SENSOR) MISC 1 Device by Does not apply route every 3 (three) days. 03/18/16  Yes Renato Shin, MD  Cranberry 400 MG TABS Take 2 each by mouth daily.     Yes [provider]  Cyanocobalamin (VITAMIN B-12 CR PO) Take 1 each by mouth daily.     Yes [provider]  ezetimibe (ZETIA) 10 MG tablet TAKE 1 TABLET DAILY 05/03/16  Yes Renato Shin, MD  finasteride (PROSCAR) 5 MG tablet Take 5 mg by mouth daily.    Yes [provider]  glucagon 1 MG injection Inject 1 mg into the vein once as needed. Patient taking differently: Inject 1 mg once as  needed into the vein (severe hypoglycemia).  07/25/13  Yes Renato Shin, MD  glucose blood (BAYER CONTOUR NEXT TEST) test strip Use to check blood sugar 5 times per day dx 250.01, 03/18/16  Yes Renato Shin, MD  HUMALOG 100 UNIT/ML injection INJECT 120 UNITS DAILY IN PUMP 10/26/16  Yes Renato Shin, MD  Insulin Infusion Pump Supplies (PARADIGM RESERVOIR 3ML) MISC 1 Device by Does not apply route every 3 (three) days. 01/21/16  Yes Renato Shin, MD  Insulin Infusion Pump Supplies (QUICK-SET INFUSION 43" 9MM) MISC 1 Device by Does not apply route every 3 (three) days. 03/18/16  Yes Renato Shin, MD  Lancets Misc. (ACCU-CHEK MULTICLIX LANCET DEV) KIT  1 Device by Other route 5 (five) times daily as needed. 5/day dx 250.01 01/03/15  Yes Renato Shin, MD  lisinopril-hydrochlorothiazide (PRINZIDE,ZESTORETIC) 10-12.5 MG tablet TAKE 1 TABLET DAILY 10/04/16  Yes Renato Shin, MD  Misc. Devices MISC IV 3000 infusion set cover.  Change every 3 days 12/08/12  Yes Renato Shin, MD  Multiple Vitamins-Minerals (MULTIVITAMIN,TX-MINERALS) tablet Take 1 tablet by mouth daily.     Yes [provider]  rosuvastatin (CRESTOR) 40 MG tablet TAKE 1 TABLET DAILY 08/21/16  Yes Renato Shin, MD    Allergies  No Known Allergies  Review of Systems  ROS  Neurologic Exam  Awake, alert, oriented Memory and concentration grossly intact Speech fluent, appropriate CN grossly intact Motor exam: Upper Extremities Deltoid Bicep Tricep Grip  Right 5/5 5/5 5/5 5/5  Left 5/5 5/5 5/5 5/5   Lower Extremities IP Quad PF DF EHL  Right 5/5 5/5 5/5 5/5 5/5  Left 5/5 5/5 5/5 5/5 5/5   Sensation grossly intact to LT 3+/4 reflexes bilateral patella, achilles Non-sustained clonus bilaterally Weak (+) right Hoffman's  Imaging  MRI of the cervical spine was reviewed.  This demonstrates congenital cervical stenosis with superimposed degenerative disc disease and disc bulges with resultant severe canal stenosis from C3-4 to C5-6.  There is loss of normal cervical lordosis, with perhaps slight kyphotic angulation.  No obvious intrinsic T2 signal changes seen.  Impression  - 66 y.o. male with progressive motor weakness, with both upper and lower motor neuron findings, as well as sensory disturbance.  Lumbar puncture does demonstrate increased protein with relatively normal cell count.  In addition, his outpatient neurologist does seem to indicate that his condition did improve somewhat with a course of IVIG.  Also, he does have subjective complaint of shortness of breath.  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  At this point, I think further treatment of possible AIDP would be reasonable prior to considering surgical decompression which would likely entail multilevel anterior decompression possibly with the need for posterior decompression as well.  Plan  -I have spoken to Dr. Leonel Ramsay who agrees that we can proceed with plasma exchange and monitor the patient's condition clinically.  If he does not demonstrate some improvement with the exchange we could consider surgical decompression of his cervical spondylosis.  I have reviewed the plan above with the patient.  All his questions were answered, he is willing to proceed as above.

## 2017-01-02 NOTE — Progress Notes (Signed)
NIF -32cmH20 and VC 2.6L good effort

## 2017-01-02 NOTE — Progress Notes (Signed)
Subjective: No significant changes  Exam: Vitals:   01/02/17 0307 01/02/17 0829  BP: 118/71   Pulse:    Resp: 15 15  Temp: 97.6 F (36.4 C) (!) 97.5 F (36.4 C)  SpO2: 95% 94%   Gen: In bed, NAD Resp: non-labored breathing, no acute distress Abd: soft, nt  Neuro: MS: awake, alert ZH:YQMVH, EOMI Motor: He is able to lift his legs against gravity bilaterally, 4/5 bilateral leg, 4+/5 bilateral arms 4/5 distally.  Sensory:decreased to elbow bialterally DTR: still brisk at the knees, but no clonus.    Pertinent Labs: ESR, CRP - normal Vitamin A  - normal at 42.2 Vitamin D - normal at 38.7 Vitamin E - borderline at 7.4 Copper, zinc - normal. CSF ACE - normal SSA, SSB, ANA - negative HTLV Ab- negative  Impression: 66 year old male with progressive weakness, numbness fasciculations, hyperreflexia.  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   I feel that his exam is most consistent with a myeloneuropathy.  With the new fasciculations starting recently, I doubt that the EMG findings were chronic related to diabetes superimposed on just a myelopathy.  With sensory changes, I feel that ALS superimposed on underlying neuropathy is also relatively unlikely.  I have sent labs including  He does have cervical stenosis and I continue to worry that he might have more than one process going on at this time. I think that plasma exchange is reasonable at this time.  If this does represent myeloneuropathy, then PLEX would be appropriate given that the progression would be most consistent with autoimmune causes. His vitamin E level is of unclear significance, but it is not very low, so I have low suspicion it is the causitive agent. In any case, will start supplement.   Recommendations: 1) Vit E supplement.  2) Appreciate neurosurgery  3) PLEX starting today   Roland Rack, MD [facility] [phone]  If 7pm- 7am, please page neurology on call as listed  in [place].

## 2017-01-02 NOTE — Progress Notes (Signed)
KAISON MCPARLAND  PZW:[id number] DOB: Jun 13, 1950 DOA: 12/29/2016 PCP: Renato Shin, MD    Brief Narrative:  66 y.o. male with medical history significant for but not limited to subacute inflammatory polyradiculoneuropathy, DM on insulin pump, presenting with progressive weakness, numbness and hyperreflexia and history of demyelinating neuropathy on EMG, and felt to have myeloneuropathy. 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 Patient has had MRI which indicated some element of cervical myelopathy due to his constellation of signs/symptoms.   Assessment & Plan:  Progressive spastic quadriparesis - demyelinating neuropathy  -Neurology consulting -plasmapheresis today -MRI -Multilevel moderate cervical spinal canal stenosis, with moderate to  severe neural foraminal stenosis at C3-4, C4-5 and C5-6. -Neurosurgery consult concurs with trial of plasmapheresis and if not better consider neurosurgical intervention  Diabetes mellitus A1c 7.3 on 11/20/16  Sliding scale insulin as needed  Dyslipidemia lipid meds on hold for now until myeloneuropathy better understood    HTN BP reasonably controlled - follow w/o change in tx plan today   BPH Continue Proscar  Depression Stable  DVT prophylaxis: SCDs until clear if procedures will be required  Code Status: FULL CODE Family Communication: discussed in detail with patient's spouse via phone. Updated care and answered questions. Disposition Plan: SDU  Consultants:  Neurology  Neurosurgery  Procedures: Lumbar puncture 11/15.  Antimicrobials:  None   Subjective: 49 No fever or chills.  No shortness of breath Objective:  Vitals:   01/01/17 2013 01/02/17 0017 01/02/17 0307 01/02/17 0829  BP: 114/76 120/74 118/71   Pulse:      Resp: 20 15 15 15   Temp: 98.6 F (37  C) 97.7 F (36.5 C) 97.6 F (36.4 C) (!) 97.5 F (36.4 C)  TempSrc: Oral Oral Oral Oral  SpO2: 94% 96% 95% 94%  Weight:   107.4 kg (236 lb 12.8 oz)   Height:         Intake/Output Summary (Last 24 hours) at 01/02/2017 1055 Last data filed at 01/02/2017 0310 Gross per 24 hour  Intake -  Output 2100 ml  Net -2100 ml   Filed Weights   12/29/16 2204 12/30/16 1400 01/02/17 0307  Weight: 108 kg (238 lb) 103.1 kg (227 lb 6.4 oz) 107.4 kg (236 lb 12.8 oz)    Examination: General: No acute respiratory distress. Middle-aged male, moderately built and nourished, lying comfortably propped up in bed. Lungs: Clear to auscultation bilaterally without wheezes or crackles. No increased work of breathing. Cardiovascular: Regular rate and rhythm without murmur gallop or rub normal S1 and S2. Telemetry personally reviewed: Sinus rhythm. Abdomen: Nontender, nondistended, soft, bowel sounds positive, no rebound, no ascites, no appreciable mass Extremities: No significant cyanosis, clubbing, or edema bilateral lower extremities. Grade 5 x 5 power in upper extremities except in her hands when he has decreased grip. Lower extremities: Grade 2-3/5 power proximally in the hips and knees but grade 4 power in the CNS: Alert and oriented. No cranial nerve deficits. Psychiatric: Pleasant and appropriate interaction. In good spirits.  CBC: Recent Labs  Lab 12/29/16 2207 12/30/16 0311 12/31/16 0434  WBC 8.5 8.7 8.2  HGB 16.8 15.7 15.7  HCT 47.2 43.8 45.4  MCV 86.8 86.6 87.5  PLT 195 157 423   Basic Metabolic Panel: Recent Labs  Lab 12/29/16 2207 12/30/16 0311 12/31/16 0434  NA 136 137 138  K 3.6 3.3* 3.8  CL 101 105 106  CO2 26 27 26   GLUCOSE 118* 70 141*  BUN 21* 23* 15  CREATININE 0.90 0.92 0.82  CALCIUM 9.2 9.0 8.9  MG  --   --  2.0  PHOS  --   --  3.6   GFR: Estimated Creatinine Clearance: 115.2 mL/min (by C-G formula based on SCr of 0.82 mg/dL).  Liver Function Tests: Recent  Labs  Lab 12/31/16 0434  AST 26  ALT 27  ALKPHOS 152*  BILITOT 0.7  PROT 6.6  ALBUMIN 3.5    Coagulation Profile: Recent Labs  Lab 12/29/16 2207  INR 1.04    Cardiac Enzymes: Recent Labs  Lab 12/30/16 0311  CKTOTAL 223    HbA1C: Hemoglobin A1C  Date/Time Value Ref Range Status  10/30/2016 08:56 AM 7.2  Final  08/12/2016 10:15 AM 7.3  Final   Hgb A1c MFr Bld  Date/Time Value Ref Range Status  11/20/2016 06:57 PM 7.3 (H) 4.8 - 5.6 % Final    Comment:    (NOTE) Pre diabetes:          5.7%-6.4% Diabetes:              >6.4% Glycemic control for   <7.0% adults with diabetes   08/22/2014 08:21 AM 8.4 (H) 4.6 - 6.5 % Final    Comment:    Glycemic Control Guidelines for People with Diabetes:Non Diabetic:  <6%Goal of Therapy: <7%Additional Action Suggested:  >8%     CBG: Recent Labs  Lab 01/01/17 0754 01/01/17 1029 01/01/17 1735 01/01/17 2114 01/02/17 0743  GLUCAP 40* 177* 173* 161* 87     Scheduled Meds: . busPIRone  30 mg Oral BID  . calcium carbonate  2 tablet Oral Q3H  . finasteride  5 mg Oral Daily  . heparin  1,000 Units Intracatheter Once  . heparin  1,000 Units Intracatheter Once  . lisinopril  10 mg Oral Daily   And  . hydrochlorothiazide  12.5 mg Oral Daily  . insulin pump   Subcutaneous TID AC, HS, 0200  . multivitamin with minerals  1 tablet Oral Daily  . polyethylene glycol  17 g Oral BID  . vitamin B-12  1,000 mcg Oral Daily     LOS: 3 days    OSEI-BONSU,Samariah Hokenson, MD,  [facility] Pager [phone]  If 7PM-7AM, please contact night-coverage [email or web address] Password [email or web address] 01/02/2017, 10:55 AM

## 2017-01-02 NOTE — Progress Notes (Signed)
NIF -40 VC 3L Performed with great effort.

## 2017-01-03 LAB — GLUCOSE, CAPILLARY
GLUCOSE-CAPILLARY: 463 mg/dL — AB (ref 65–99)
GLUCOSE-CAPILLARY: 359 mg/dL — AB (ref 65–99)
GLUCOSE-CAPILLARY: 203 mg/dL — AB (ref 65–99)
Glucose-Capillary: 182 mg/dL — ABNORMAL HIGH (ref 65–99)

## 2017-01-03 MED ORDER — ACETAMINOPHEN 325 MG PO TABS: 650.0000 mg | ORAL_TABLET | ORAL | Status: DC | PRN

## 2017-01-03 MED ORDER — DIPHENHYDRAMINE HCL 25 MG PO CAPS: 25.0000 mg | ORAL_CAPSULE | Freq: Four times a day (QID) | ORAL | Status: DC | PRN

## 2017-01-03 MED ORDER — SODIUM CHLORIDE 0.9 % IV SOLN
INTRAVENOUS | Status: AC
  Filled 2017-01-03 (×3): qty 200

## 2017-01-03 MED ORDER — SODIUM CHLORIDE 0.9 % IV SOLN
INTRAVENOUS | Status: AC
  Administered 2017-01-03 (×3): via INTRAVENOUS_CENTRAL
  Filled 2017-01-03 (×4): qty 200

## 2017-01-03 MED ORDER — TRAMADOL HCL 50 MG PO TABS
50.0000 mg | ORAL_TABLET | Freq: Four times a day (QID) | ORAL | Status: DC | PRN
  Administered 2017-01-03: 50 mg via ORAL
  Filled 2017-01-03: qty 1

## 2017-01-03 MED ORDER — CALCIUM GLUCONATE 10 % IV SOLN
2.0000 g | Freq: Once | INTRAVENOUS | Status: AC
  Administered 2017-01-03: 2 g via INTRAVENOUS
  Filled 2017-01-03: qty 20

## 2017-01-03 MED ORDER — LISINOPRIL 5 MG PO TABS
5.0000 mg | ORAL_TABLET | Freq: Every day | ORAL | Status: DC
  Administered 2017-01-03: 5 mg via ORAL
  Filled 2017-01-03: qty 1

## 2017-01-03 MED ORDER — ZOLPIDEM TARTRATE 5 MG PO TABS
5.0000 mg | ORAL_TABLET | Freq: Every evening | ORAL | Status: DC | PRN
Start: 2017-01-03 — End: 2017-01-04
  Administered 2017-01-03: 5 mg via ORAL
  Filled 2017-01-03: qty 1

## 2017-01-03 MED ORDER — ACD FORMULA A 0.73-2.45-2.2 GM/100ML VI SOLN
500.0000 mL | Status: DC
  Administered 2017-01-03: 500 mL via INTRAVENOUS

## 2017-01-03 MED ORDER — HEPARIN SODIUM (PORCINE) 1000 UNIT/ML IJ SOLN: 1000.0000 [IU] | Freq: Once | INTRAMUSCULAR | Status: DC

## 2017-01-03 MED ORDER — CALCIUM CARBONATE ANTACID 500 MG PO CHEW
2.0000 | CHEWABLE_TABLET | ORAL | Status: AC
  Administered 2017-01-03: 400 mg via ORAL

## 2017-01-03 MED ORDER — CALCIUM CARBONATE ANTACID 500 MG PO CHEW
CHEWABLE_TABLET | ORAL | Status: AC
  Administered 2017-01-03: 400 mg via ORAL
  Filled 2017-01-03: qty 2

## 2017-01-03 MED ORDER — ACD FORMULA A 0.73-2.45-2.2 GM/100ML VI SOLN
Status: AC
  Filled 2017-01-03: qty 500

## 2017-01-03 NOTE — Progress Notes (Signed)
PROGRESS NOTE    Kenneth Pace  LPF:[id number] DOB: 1951-02-14 DOA: 12/29/2016 PCP: Renato Shin, MD  Brief Narrative:66 y.o.malewith medical history significant for but not limited to subacute inflammatory polyradiculoneuropathy, DM on insulin pump, presenting with progressive weakness, numbness and hyperreflexia and history of demyelinating neuropathy on EMG, and felt to have myeloneuropathy. 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 Patient has had MRI which indicated some element of cervical myelopathy due to his constellation of signs/symptoms.      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  He received the first dose of plasmapheresis yesterday.  MRI of the cervical spine shows moderate to severe neural foraminal stenosis at the level of C3 through to C6.  Patient feels there is a difference in terms of his strength in the left lower extremity.  Patient did have another plasmapheresis done today and then every other day to finish 5  Course. 2] type 2 diabetes patient has an insulin pump in place.  Blood sugar for some reason has been elevated this morning.  He reports that he has not had anything different in terms of eating.  Wonder if it has something to do with plasmapheresis.  Diabetes coordinator notes reviewed . 3]HTN on lisinopril and hydrochlorothiazide 4]BPH on Proscar 5] dyslipidemia continue was on Zetia and Crestor as an outpatient which has not been restarted. 6]  depression on BuSpar DVT prophylaxis: SCD Code Status: Full code  Family Communication: No family available Disposition Plan: TBD   Consultants: Neurology  Procedures: None Antimicrobials: None  Subjective: Weakness in upper and lower extremity.  Feels like a band across the diaphragm from the back.  Objective: Sitting up in his chair in no acute distress Vitals:   01/03/17 0409 01/03/17 0700 01/03/17 0749 01/03/17 1121  BP: 113/68 97/62 97/62  117/69  Pulse:      Resp: 18 19 19 19   Temp: 97.6 F (36.4 C)  97.9 F (36.6 C) 97.8 F (36.6 C)  TempSrc: Oral  Axillary Oral  SpO2: 93% (!) 88% 97% 90%  Weight: 101.7 kg (224 lb 1.6 oz)     Height:        Intake/Output Summary (Last 24 hours) at 01/03/2017 1215 Last data filed at 01/03/2017 1000 Gross per 24 hour  Intake -  Output 3050 ml  Net -3050 ml   Filed Weights   01/02/17 0307 01/02/17 1515 01/03/17 0409  Weight: 107.4 kg (236 lb 12.8 oz) 107.4 kg (236 lb 12.4 oz) 101.7 kg (224 lb 1.6 oz)    Examination:  General exam: Appears calm and comfortable  Respiratory system: Clear to auscultation. Respiratory effort normal. Cardiovascular system: S1 & S2 heard, RRR. No JVD, murmurs, rubs, gallops or clicks. No pedal edema. Gastrointestinal system: Abdomen is nondistended, soft and nontender. No organomegaly or masses felt. Normal bowel sounds heard. Central nervous system: Alert and oriented. Skin: No rashes, lesions or ulcers Psychiatry: Judgement and insight appear normal. Mood & affect appropriate.     Data Reviewed: I have personally reviewed following labs and imaging studies  CBC: Recent Labs  Lab 12/29/16 2207  12/30/16 0311 12/31/16 0434 01/02/17 1617  WBC 8.5 8.7 8.2 9.6  NEUTROABS  --   --   --  6.7  HGB 16.8 15.7 15.7 16.9  HCT 47.2 43.8 45.4 47.0  MCV 86.8 86.6 87.5 86.6  PLT 195 157 169 025   Basic Metabolic Panel: Recent Labs  Lab 12/29/16 2207 12/30/16 0311 12/31/16 0434  NA 136 137 138   K 3.6 3.3* 3.8  CL 101 105 106  CO2 26 27 26   GLUCOSE 118* 70 141*  BUN 21* 23* 15  CREATININE 0.90 0.92 0.82  CALCIUM 9.2 9.0 8.9  MG  --   --  2.0  PHOS  --   --  3.6   GFR: Estimated Creatinine Clearance: 112.2 mL/min (by C-G formula based on SCr of 0.82 mg/dL). Liver Function Tests: Recent Labs  Lab 12/31/16 0434  AST 26  ALT 27  ALKPHOS 152*  BILITOT 0.7  PROT 6.6  ALBUMIN 3.5   No results for input(s): LIPASE, AMYLASE in the last 168 hours. No results for input(s): AMMONIA in the last 168 hours. Coagulation Profile: Recent Labs  Lab 12/29/16 2207  INR 1.04   Cardiac Enzymes: Recent Labs  Lab 12/30/16 0311  CKTOTAL 223   BNP (last 3 results) No results for input(s): PROBNP in the last 8760 hours. HbA1C: No results for input(s): HGBA1C in the last 72 hours. CBG: Recent Labs  Lab 01/02/17 0743 01/02/17 1140 01/02/17 2111 01/03/17 0732 01/03/17 1135  GLUCAP 87 228* 319* 463* 359*   Lipid Profile: No results for input(s): CHOL, HDL, LDLCALC, TRIG, CHOLHDL, LDLDIRECT in the last 72 hours. Thyroid Function Tests: No results for input(s): TSH, T4TOTAL, FREET4, T3FREE, THYROIDAB in the last 72 hours. Anemia Panel: No results for input(s): VITAMINB12, FOLATE, FERRITIN, TIBC, IRON, RETICCTPCT in the last 72 hours. Sepsis Labs: No results for input(s): PROCALCITON, LATICACIDVEN in the last 168 hours.  Recent Results (from the past 240 hour(s))  MRSA PCR Screening     Status: None   Collection Time: 12/30/16  2:19 PM  Result Value Ref Range Status   MRSA by PCR NEGATIVE NEGATIVE Final    Comment:        The GeneXpert MRSA Assay (FDA approved for NASAL specimens only), is one component of a comprehensive MRSA colonization surveillance program. It is not intended to diagnose MRSA infection nor to guide or monitor treatment for MRSA infections.          Radiology Studies: Ct Chest W Contrast  Result Date: 01/01/2017 CLINICAL DATA:  Lower  abdominal pain, bilateral lower extremity numbness. EXAM: CT CHEST, ABDOMEN, AND PELVIS WITH CONTRAST TECHNIQUE: Multidetector CT imaging of the chest, abdomen and pelvis was performed following the standard protocol during bolus administration of intravenous contrast. CONTRAST:  152mL ISOVUE-300 IOPAMIDOL (ISOVUE-300) INJECTION 61% COMPARISON:  Chest CT dated 04/22/2009. FINDINGS: CT CHEST FINDINGS Cardiovascular: Heart size is normal. No pericardial effusion. No thoracic aortic aneurysm or dissection. Coronary artery calcifications noted. Mediastinum/Nodes: Esophagus is unremarkable. No mass or enlarged lymph nodes seen within the mediastinum or perihilar regions. Trachea and central bronchi are unremarkable. Lungs/Pleura: Small benign calcified granuloma within the right upper lobe. Mild chronic scarring/atelectasis within the lingula. No evidence of acute pulmonary abnormality. No pulmonary nodule or mass. No pleural effusion or pneumothorax. Musculoskeletal: Multiple old healed left-sided rib fractures. Mild degenerative spurring within the thoracic spine. No acute or suspicious osseous finding. CT ABDOMEN PELVIS FINDINGS Hepatobiliary: No focal liver abnormality is seen. No gallstones,  gallbladder wall thickening, or biliary dilatation. Pancreas: Atrophic but otherwise unremarkable. Spleen: Normal in size without focal abnormality. Adrenals/Urinary Tract: Adrenal glands appear normal. Kidneys are unremarkable without mass, stone or hydronephrosis. No ureteral or bladder calculi identified. Bladder walls are thickened circumferentially, possibly accentuated by the bladder decompression. Foley catheter in place. Prostate gland is enlarged causing mass effect on the bladder base. Stomach/Bowel: No dilated large or small bowel loops. No bowel wall thickening or evidence of bowel wall inflammation. Fairly large amount of stool throughout the nondistended colon. Appendix is not convincingly seen but there are no  inflammatory changes about the cecum to suggest acute appendicitis. Stomach appears normal. Vascular/Lymphatic: Mild aortic atherosclerosis. No acute appearing vascular abnormality. No enlarged lymph nodes seen in the abdomen or pelvis. Reproductive: Prostate is enlarged. Other: No free fluid or abscess collection. No free intraperitoneal air. Musculoskeletal: Mild degenerative change in the lumbar spine. No acute or suspicious osseous finding. Small bilateral inguinal hernias which contain fat only. Superficial soft tissues are otherwise unremarkable. IMPRESSION: 1. No evidence of acute abnormality within the chest, abdomen or pelvis. Fairly large amount of stool throughout the nondistended colon (constipation? ). 2. Bladder walls are thickened circumferentially, likely accentuated to some degree by the bladder decompression (Foley catheter in place), favor chronic neurogenic bladder related to underlying prostate hypertrophy. 3. Prostate gland is enlarged causing mass effect on the bladder base. Consider correlation with PSA lab values. Consider prostate MRI to exclude underlying malignancy. 4. Otherwise, no evidence of neoplastic process within the chest, abdomen or pelvis. 5. Aortic atherosclerosis. Electronically Signed   By: Franki Cabot M.D.   On: 01/01/2017 17:10   Ct Abdomen Pelvis W Contrast  Result Date: 01/01/2017 CLINICAL DATA:  Lower abdominal pain, bilateral lower extremity numbness. EXAM: CT CHEST, ABDOMEN, AND PELVIS WITH CONTRAST TECHNIQUE: Multidetector CT imaging of the chest, abdomen and pelvis was performed following the standard protocol during bolus administration of intravenous contrast. CONTRAST:  120mL ISOVUE-300 IOPAMIDOL (ISOVUE-300) INJECTION 61% COMPARISON:  Chest CT dated 04/22/2009. FINDINGS: CT CHEST FINDINGS Cardiovascular: Heart size is normal. No pericardial effusion. No thoracic aortic aneurysm or dissection. Coronary artery calcifications noted. Mediastinum/Nodes:  Esophagus is unremarkable. No mass or enlarged lymph nodes seen within the mediastinum or perihilar regions. Trachea and central bronchi are unremarkable. Lungs/Pleura: Small benign calcified granuloma within the right upper lobe. Mild chronic scarring/atelectasis within the lingula. No evidence of acute pulmonary abnormality. No pulmonary nodule or mass. No pleural effusion or pneumothorax. Musculoskeletal: Multiple old healed left-sided rib fractures. Mild degenerative spurring within the thoracic spine. No acute or suspicious osseous finding. CT ABDOMEN PELVIS FINDINGS Hepatobiliary: No focal liver abnormality is seen. No gallstones, gallbladder wall thickening, or biliary dilatation. Pancreas: Atrophic but otherwise unremarkable. Spleen: Normal in size without focal abnormality. Adrenals/Urinary Tract: Adrenal glands appear normal. Kidneys are unremarkable without mass, stone or hydronephrosis. No ureteral or bladder calculi identified. Bladder walls are thickened circumferentially, possibly accentuated by the bladder decompression. Foley catheter in place. Prostate gland is enlarged causing mass effect on the bladder base. Stomach/Bowel: No dilated large or small bowel loops. No bowel wall thickening or evidence of bowel wall inflammation. Fairly large amount of stool throughout the nondistended colon. Appendix is not convincingly seen but there are no inflammatory changes about the cecum to suggest acute appendicitis. Stomach appears normal. Vascular/Lymphatic: Mild aortic atherosclerosis. No acute appearing vascular abnormality. No enlarged lymph nodes seen in the abdomen or pelvis. Reproductive: Prostate is enlarged. Other: No free fluid or abscess collection.  No free intraperitoneal air. Musculoskeletal: Mild degenerative change in the lumbar spine. No acute or suspicious osseous finding. Small bilateral inguinal hernias which contain fat only. Superficial soft tissues are otherwise unremarkable.  IMPRESSION: 1. No evidence of acute abnormality within the chest, abdomen or pelvis. Fairly large amount of stool throughout the nondistended colon (constipation? ). 2. Bladder walls are thickened circumferentially, likely accentuated to some degree by the bladder decompression (Foley catheter in place), favor chronic neurogenic bladder related to underlying prostate hypertrophy. 3. Prostate gland is enlarged causing mass effect on the bladder base. Consider correlation with PSA lab values. Consider prostate MRI to exclude underlying malignancy. 4. Otherwise, no evidence of neoplastic process within the chest, abdomen or pelvis. 5. Aortic atherosclerosis. Electronically Signed   By: Franki Cabot M.D.   On: 01/01/2017 17:10   Dg Chest Port 1 View  Result Date: 01/01/2017 CLINICAL DATA:  Central line placement. EXAM: PORTABLE CHEST 1 VIEW COMPARISON:  Chest x-ray dated 12/29/2016. FINDINGS: Right IJ central line appears adequately positioned at the level of the upper SVC. No pneumothorax. Heart size and mediastinal contours are stable. IMPRESSION: Right IJ central line in place with tip at the level of the upper SVC. No pneumothorax seen. Electronically Signed   By: Franki Cabot M.D.   On: 01/01/2017 16:57        Scheduled Meds: . busPIRone  30 mg Oral BID  . calcium carbonate  2 tablet Oral Q3H  . finasteride  5 mg Oral Daily  . heparin  1,000 Units Intracatheter Once  . lisinopril  10 mg Oral Daily   And  . hydrochlorothiazide  12.5 mg Oral Daily  . insulin pump   Subcutaneous TID AC, HS, 0200  . multivitamin with minerals  1 tablet Oral Daily  . polyethylene glycol  17 g Oral BID  . vitamin B-12  1,000 mcg Oral Daily  . vitamin E  100 Units Oral Daily   Continuous Infusions: . therapeutic plasma exchange solution    . calcium gluconate IVPB    . citrate dextrose       LOS: 4 days      Georgette Shell, MD [facility] If 7PM-7AM, please contact  night-coverage [email or web address] Password [email or web address] 01/03/2017, 12:15 PM

## 2017-01-03 NOTE — Progress Notes (Signed)
VC 2.9L  NIF -40.  Good effort

## 2017-01-03 NOTE — Progress Notes (Signed)
Inpatient Diabetes Program Recommendations  AACE/ADA: New Consensus Statement on Inpatient Glycemic Control (2015)  Target Ranges:  Prepandial:   less than 140 mg/dL      Peak postprandial:   less than 180 mg/dL (1-2 hours)      Critically ill patients:  140 - 180 mg/dL   Results for SKYE, RODARTE (MRN [id number]) as of 01/03/2017 09:49  Ref. Range 01/01/2017 07:54 01/01/2017 10:29 01/01/2017 17:35 01/01/2017 21:14 01/02/2017 07:43 01/02/2017 11:40 01/02/2017 21:11 01/03/2017 07:32  Glucose-Capillary Latest Ref Range: 65 - 99 mg/dL 40 (LL) 177 (H) 173 (H) 161 (H) 87 228 (H) 319 (H) 463 (H)   Review of Glycemic Control  Current orders for Inpatient glycemic control: Insulin Pump ACHS & 2AM  Inpatient Diabetes Program Recommendations:  Insulin Pump:  Noted glucose 463 mg/dl this morning. Recommend checking glucose now and if glucose has not come down, would ask RN to call MD to request BMET and one time SQ Novolog correction dose and have patient change out insulin pump infusion site.  NOTE: Spoke with Earlie Server, RN and she is in patient's room at this time. Patient has just rechecked his glucose and it is 364 mg/dl. Patient is going to go ahead and change out insulin infusion site and tubing. Asked that if patient is checking his glucose in between hospital glucometer monitoring to have nursing to chart patient's documentation of finger sticks as well. Unclear why glucose is so elevated this morning. Patient also reports that he will check pump and make sure their are no issues with the pump. Will continue to follow along.   Thanks, Barnie Alderman, RN, MSN, CDE Diabetes Coordinator Inpatient Diabetes Program [phone] (Team Pager from 8am to 5pm)

## 2017-01-03 NOTE — Progress Notes (Signed)
Subjective: No significant changes  Exam: Vitals:   01/03/17 0409 01/03/17 0700  BP: 113/68 97/62  Pulse:    Resp: 18 19  Temp: 97.6 F (36.4 C)   SpO2: 93% (!) 88%   Gen: In bed, NAD Resp: non-labored breathing, no acute distress Abd: soft, nt  Neuro: MS: awake, alert SE:LTRVU, EOMI Motor: He is able to lift his legs against gravity bilaterally, 4/5 bilateral legs with the exception of 3/5 hip flexion, 4+/5 bilateral arms 4/5 distally.  Sensory:decreased to elbow bialterally DTR: still brisk at the knees, but no clonus.    Pertinent Labs: ESR, CRP - normal Vitamin A  - normal at 42.2 Vitamin D - normal at 38.7 Vitamin E - borderline at 7.4 Copper, zinc - normal. CSF ACE - normal SSA, SSB, ANA - negative HTLV Ab- negative  Impression: 66 year old male with progressive weakness, numbness fasciculations, hyperreflexia.  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   I feel that his exam is most consistent with a myeloneuropathy.  With the new fasciculations starting recently, I doubt that the EMG findings were chronic related to diabetes superimposed on just a myelopathy.  With sensory changes, I feel that ALS superimposed on underlying neuropathy is also relatively unlikely.  I have sent labs including  He does have cervical stenosis and I continue to worry that he might have more than one process going on at this time. I think that plasma exchange is reasonable at this time.  If this does represent myeloneuropathy, then PLEX would be appropriate given that the progression would be most consistent with autoimmune causes. His vitamin E level is of unclear significance, but it is not very low, so I have low suspicion it is the causitive agent. In any case, have started supplement.   Recommendations: 1) Vit E supplement.  2) Appreciate neurosurgery  3) PLEX again today, then QOD for total 5 treatments. Will check INR for Tuesday morning.    Roland Rack,  MD [facility] [phone]  If 7pm- 7am, please page neurology on call as listed in [facility].

## 2017-01-03 NOTE — Progress Notes (Signed)
LATE ENTRY FOR 1355, ISTAT GLUCOSE 342, PATIENT SELF BOLUS WITH 5 UNITS OF HUMALOG VIA INSULIN PUMP

## 2017-01-04 LAB — GLUCOSE, CAPILLARY
GLUCOSE-CAPILLARY: 44 mg/dL — AB (ref 65–99)
GLUCOSE-CAPILLARY: 149 mg/dL — AB (ref 65–99)
GLUCOSE-CAPILLARY: 259 mg/dL — AB (ref 65–99)
GLUCOSE-CAPILLARY: 67 mg/dL (ref 65–99)
GLUCOSE-CAPILLARY: 103 mg/dL — AB (ref 65–99)
Glucose-Capillary: 37 mg/dL — CL (ref 65–99)
Glucose-Capillary: 192 mg/dL — ABNORMAL HIGH (ref 65–99)

## 2017-01-04 LAB — POCT I-STAT, CHEM 8
BUN: 14 mg/dL (ref 6–20)
BUN: 34 mg/dL — ABNORMAL HIGH (ref 6–20)
CALCIUM ION: 0.62 mmol/L — AB (ref 1.15–1.40)
CREATININE: 0.9 mg/dL (ref 0.61–1.24)
Calcium, Ion: 1.25 mmol/L (ref 1.15–1.40)
Chloride: 108 mmol/L (ref 101–111)
Chloride: 98 mmol/L — ABNORMAL LOW (ref 101–111)
Creatinine, Ser: 0.3 mg/dL — ABNORMAL LOW (ref 0.61–1.24)
GLUCOSE: 342 mg/dL — AB (ref 65–99)
Glucose, Bld: 161 mg/dL — ABNORMAL HIGH (ref 65–99)
HEMATOCRIT: 35 % — AB (ref 39.0–52.0)
HEMATOCRIT: 51 % (ref 39.0–52.0)
HEMOGLOBIN: 11.9 g/dL — AB (ref 13.0–17.0)
HEMOGLOBIN: 17.3 g/dL — AB (ref 13.0–17.0)
POTASSIUM: 4.6 mmol/L (ref 3.5–5.1)
Potassium: 2.7 mmol/L — CL (ref 3.5–5.1)
SODIUM: 143 mmol/L (ref 135–145)
Sodium: 135 mmol/L (ref 135–145)
TCO2: 20 mmol/L — AB (ref 22–32)
TCO2: 22 mmol/L (ref 22–32)

## 2017-01-04 MED ORDER — LISINOPRIL 2.5 MG PO TABS
2.5000 mg | ORAL_TABLET | Freq: Every day | ORAL | Status: DC
  Administered 2017-01-04 – 2017-01-10 (×7): 2.5 mg via ORAL
  Filled 2017-01-04 (×7): qty 1

## 2017-01-04 MED ORDER — BUSPIRONE HCL 10 MG PO TABS
30.0000 mg | ORAL_TABLET | Freq: Every day | ORAL | Status: DC
  Administered 2017-01-04 – 2017-01-10 (×7): 30 mg via ORAL
  Filled 2017-01-04 (×2): qty 2
  Filled 2017-01-04: qty 3
  Filled 2017-01-04 (×4): qty 2

## 2017-01-04 NOTE — Progress Notes (Signed)
-  40 NIF

## 2017-01-04 NOTE — Progress Notes (Signed)
Hypoglycemic Event  CBG: 37  Treatment: 15 GM carbohydrate snack  Symptoms: None  Follow-up CBG: Time:0818 CBG Result:44  Possible Reasons for Event: Unknown  Comments/MD notified:rechecked cbg after breakfast per pt's wishes cbg 149 @ 0910. Mentioned it to Dr Juliane Lack, will continue to monitor.    Kenneth Pace
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
## 2017-01-04 NOTE — Clinical Social Work Note (Signed)
Clinical Social Work Assessment  Patient Details  Name: Kenneth Pace MRN: [id number] Date of Birth: 11/01/1950  Date of referral:  01/04/17               Reason for consult:  Facility Placement, Discharge Planning                Permission sought to share information with:  Facility Sport and exercise psychologist, Family Supports Permission granted to share information::  Yes, Verbal Permission Granted  Name::     Bralyn Folkert  Agency::     Relationship::  Wife  Contact Information:  [phone]  Housing/Transportation Living arrangements for the past 2 months:  [place] of Information:  Patient, Medical Team Patient Interpreter Needed:  None Criminal Activity/Legal Involvement Pertinent to Current Situation/Hospitalization:  No - Comment as needed Significant Relationships:  Spouse Lives with:  Spouse Do you feel safe going back to the place where you live?  Yes Need for family participation in patient care:  Yes (Comment)  Care giving concerns:  PT recommending CIR once medically stable for discharge. CSW initiating SNF backup plan.   Social Worker assessment / plan:  CSW met with patient. No supports at bedside. CSW introduced role and explained that PT recommendations. Patient states he was getting outpatient PT/OT prior to admission until he had a decline in functioning. He is agreeable to CIR. CSW explained potential barriers to CIR and need for SNF referral as a second plan. Patient wanted CSW to call his wife to discuss SNF backup. CSW spoke with patient's wife. She is very hopeful for CIR admission and would like to avoid SNF if possible. If absolutely needed, she does not want him placed in [place] where they live. She stated even if he progressed to where she could at least get him in the car to go back to outpatient PT, she would prefer that over SNF. She is agreeable to CSW sending out SNF referral as a last resort. She stated that if insurance  denies CIR, she will call in to some contacts on their direct line to try to get it approved. CSW left SNF list in patient's room for them to review. No further concerns. CSW encouraged patient and his wife to contact CSW as needed. CSW will continue to follow patient and his wife for support and facilitate discharge to SNF, if needed, once medically stable.  Employment status:  Retired Forensic scientist:  Other (Comment Required)(BCBS) PT Recommendations:  Inpatient Rehab Consult Information / Referral to community resources:  [place]  Patient/Family's Response to care:  Patient's wife is agreeable to CSW sending out SNF referral. Patient's wife supportive and involved in patient's care. Patient and his wife appreciated social work intervention.  Patient/Family's Understanding of and Emotional Response to Diagnosis, Current Treatment, and Prognosis:  Patient and his wife have a good understanding of the reason for admission and his need for continued therapy once medically stable for discharge. Patient and his wife appear happy with hospital care.  Emotional Assessment Appearance:  Appears stated age Attitude/Demeanor/Rapport:  Other(Pleasant) Affect (typically observed):  Accepting, Appropriate, Calm, Pleasant Orientation:  Oriented to Self, Oriented to Place, Oriented to  Time, Oriented to Situation Alcohol / Substance use:  Never Used Psych involvement (Current and /or in the community):  No (Comment)  Discharge Needs  Concerns to be addressed:  Care Coordination Readmission within the last 30 days:  No Current discharge risk:  Dependent with Mobility Barriers to Discharge:  Continued Medical Work up, [place], LCSW 01/04/2017, 3:00 PM
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
## 2017-01-04 NOTE — Progress Notes (Signed)
Paged Kenneth Quill PA regarding if ok to d/c foley, will attempt void trials if unable to void will have to reinsert. Will continue to monitor.

## 2017-01-04 NOTE — Progress Notes (Signed)
        Subjective: Patient states that his feet and hands feel slightly more numb today.  When he states this what he means they feel as if they are larger than normal.  Still has no sensation in his feet and hands.  Currently he was with physical therapy standing with a 2 person assist and having some difficulty with balance but per PT doing much better.  Exam: Vitals:   01/04/17 0712 01/04/17 1122  BP: 128/67 114/68  Pulse: 79 98  Resp: 16 (!) 24  Temp: 97.8 F (36.6 C) 98.3 F (36.8 C)  SpO2: 99% 96%    HEENT-  Normocephalic, no lesions, without obvious abnormality.  Normal external eye and conjunctiva.  Normal TM's bilaterally.  Normal auditory canals and external ears. Normal external nose, mucus membranes and septum.  Normal pharynx. Cardiovascular- S1, S2 normal, pulses palpable throughout   Lungs- chest clear, no wheezing, rales, normal symmetric air entry Abdomen- normal findings: bowel sounds normal Extremities- no edema    Neuro:  CN: Pupils are equal and round. They are symmetrically reactive from 3-->2 mm. EOMI without nystagmus. Facial sensation is intact to light touch. Face is symmetric at rest with normal strength and mobility. Hearing is intact to conversational voice. Palate elevates symmetrically and uvula is midline. Voice is normal in tone, pitch and quality. Bilateral SCM and trapezii are 5/5. Tongue is midline with normal bulk and mobility.  Motor: Bilateral weak grip, bilateral 4/5 flexion and extension of wrists, bilateral 5/5 of bicep flexion-tricep extension-shoulder AB duction.  3/5 strength with hip flexion, 4-/5 knee flexion extension bilaterally, 4/5 ankle flexion and extension. Sensation: Decreased sensation up to the knees bilaterally along with decreased sensation up to the elbows bilaterally DTRs: 3+, symmetric  Toes downgoing bilaterally. No pathologic reflexes.  Coordination: Finger-to-nose and heel-to-shin are without dysmetria   Medications:   Scheduled: . busPIRone  30 mg Oral QHS  . finasteride  5 mg Oral Daily  . heparin  1,000 Units Intracatheter Once  . insulin pump   Subcutaneous TID AC, HS, 0200  . lisinopril  2.5 mg Oral Daily  . multivitamin with minerals  1 tablet Oral Daily  . polyethylene glycol  17 g Oral BID  . vitamin B-12  1,000 mcg Oral Daily  . vitamin E  100 Units Oral Daily   Continuous: . citrate dextrose      Pertinent Labs/Diagnostics: INR pending  No results found.   Etta Quill PA-C [facility] [phone]  Impression:  66 year old male with progressive weakness, numbness fasciculations, hyperreflexia.  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  Patient does have cervical stenosis which makes me think he does have more than one process going on.  He has had 2 doses of PLEX with third dose tomorrow.  He is tolerating this well.      Recommendations: Continue with current plan of PLEX for 2 more doses, check INR Tuesday, continue vitamin E supplementation. Will continue to follow    01/04/2017, 11:38 AM

## 2017-01-04 NOTE — Progress Notes (Signed)
PROGRESS NOTE    Kenneth Pace  LPF:[id number] DOB: October 18, 1950 DOA: 12/29/2016 PCP: Renato Shin, MD  Brief Narrative:  66 y.o.malewithmedicalhistorysignificant for but not limited tosubacute inflammatory polyradiculoneuropathy, DM on insulin pump,presenting with progressive weakness, numbness and hyperreflexia and history of demyelinating neuropathy on EMG,and felt to have myeloneuropathy. 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  He received the first dose of plasmapheresis yesterday.  MRI of the cervical spine shows moderate to severe neural foraminal stenosis at the level of C3 through to C6.  Patient feels there is a difference in terms of his strength in the left lower extremity.  Patient did have another plasmapheresis done yest and then every other day to finish 5  Course. 2] type 2 diabetes patient has an insulin pump in place.  Patient's blood sugar has been low this morning at 37repeat after snacks was 44 and then now up to about 100.  Patient reported that he did not have any symptoms associated with low blood sugar.  3]HTN on lisinopril.  Decrease the dose to 2.5 mg daily due to soft blood pressure. 4]BPH on Proscar 5] dyslipidemia continue was on Zetia and Crestor as an outpatient which has not been  restarted. 6] depression on BuSpar.  Currently he is on BuSpar twice a day changed to BuSpar nightly as he was taking it home.     DVT prophylaxis: SCD Code Status: Full code  Family Communication: No family available Disposition Plan: PPD Consultants:  Neurology Procedures: None Antimicrobials: None  Subjective: Complains of low blood sugar this morning with no symptoms.  Received plasmapheresis last night has not made any difference.  Did have a bowel movement yesterday.   Objective: Patient resting in bed in no acute distress awake alert. Vitals:   01/03/17 1700 01/03/17 1800 01/04/17 0451 01/04/17 0712  BP: 101/62 117/63 (!) 104/56 128/67  Pulse: 98  74 79  Resp: 18 14 12 16   Temp: 97.8 F (36.6 C)  97.9 F (36.6 C) 97.8 F (36.6 C)  TempSrc: Oral  Oral Oral  SpO2: 99%  98% 99%  Weight:   101.8 kg (224 lb 6.9 oz)   Height:        Intake/Output Summary (Last 24 hours) at 01/04/2017 0955 Last data filed at 01/04/2017 0740 Gross per 24 hour  Intake 440 ml  Output 1650 ml  Net -1210 ml   Filed Weights   01/02/17 1515 01/03/17 0409 01/04/17 0451  Weight: 107.4 kg (236 lb 12.4 oz) 101.7 kg (224 lb 1.6 oz) 101.8 kg (224 lb 6.9 oz)    Examination:  General exam: Appears calm and comfortable  Respiratory system: Clear to auscultation. Respiratory effort normal. Cardiovascular system: S1 & S2 heard, RRR. No JVD, murmurs, rubs, gallops or clicks. No pedal edema. Gastrointestinal system: Abdomen is nondistended, soft and nontender. No organomegaly or masses felt. Normal bowel sounds heard. Central nervous system: Alert and oriented. No focal neurological deficits. Skin: No rashes,  lesions or ulcers Psychiatry: Judgement and insight appear normal. Mood & affect appropriate.     Data Reviewed: I have personally reviewed following labs and imaging studies  CBC: Recent Labs  Lab 12/29/16 2207 12/30/16 0311 12/31/16 0434 01/02/17 1544 01/02/17 1617 01/03/17 1356   WBC 8.5 8.7 8.2  --  9.6  --   NEUTROABS  --   --   --   --  6.7  --   HGB 16.8 15.7 15.7 11.9* 16.9 17.3*  HCT 47.2 43.8 45.4 35.0* 47.0 51.0  MCV 86.8 86.6 87.5  --  86.6  --   PLT 195 157 169  --  182  --    Basic Metabolic Panel: Recent Labs  Lab 12/29/16 2207 12/30/16 0311 12/31/16 0434 01/02/17 1544 01/03/17 1356  NA 136 137 138 143 135  K 3.6 3.3* 3.8 2.7* 4.6  CL 101 105 106 108 98*  CO2 26 27 26   --   --   GLUCOSE 118* 70 141* 161* 342*  BUN 21* 23* 15 14 34*  CREATININE 0.90 0.92 0.82 0.30* 0.90  CALCIUM 9.2 9.0 8.9  --   --   MG  --   --  2.0  --   --   PHOS  --   --  3.6  --   --    GFR: Estimated Creatinine Clearance: 102.4 mL/min (by C-G formula based on SCr of 0.9 mg/dL). Liver Function Tests: Recent Labs  Lab 12/31/16 0434  AST 26  ALT 27  ALKPHOS 152*  BILITOT 0.7  PROT 6.6  ALBUMIN 3.5   No results for input(s): LIPASE, AMYLASE in the last 168 hours. No results for input(s): AMMONIA in the last 168 hours. Coagulation Profile: Recent Labs  Lab 12/29/16 2207  INR 1.04   Cardiac Enzymes: Recent Labs  Lab 12/30/16 0311  CKTOTAL 223   BNP (last 3 results) No results for input(s): PROBNP in the last 8760 hours. HbA1C: No results for input(s): HGBA1C in the last 72 hours. CBG: Recent Labs  Lab 01/03/17 2112 01/04/17 0747 01/04/17 0749 01/04/17 0818 01/04/17 0910  GLUCAP 182* 32* 37* 44* 149*   Lipid Profile: No results for input(s): CHOL, HDL, LDLCALC, TRIG, CHOLHDL, LDLDIRECT in the last 72 hours. Thyroid Function Tests: No results for input(s): TSH, T4TOTAL, FREET4, T3FREE, THYROIDAB in the last 72 hours. Anemia Panel: No results for input(s): VITAMINB12, FOLATE, FERRITIN, TIBC, IRON, RETICCTPCT in the last 72 hours. Sepsis Labs: No results for input(s): PROCALCITON, LATICACIDVEN in the last 168 hours.  Recent Results (from the past 240 hour(s))  MRSA PCR Screening     Status: None   Collection Time: 12/30/16  2:19 PM   Result Value Ref Range Status   MRSA by PCR NEGATIVE NEGATIVE Final    Comment:        The GeneXpert MRSA Assay (FDA approved for NASAL specimens only), is one component of a comprehensive MRSA colonization surveillance program. It is not intended to diagnose MRSA infection nor to guide or monitor treatment for MRSA infections.          Radiology Studies: No results found.      Scheduled Meds: . busPIRone  30 mg Oral QHS  . finasteride  5 mg Oral Daily  . heparin  1,000 Units Intracatheter Once  . insulin pump   Subcutaneous TID AC, HS, 0200  . lisinopril  5 mg Oral Daily  . multivitamin with minerals  1 tablet Oral Daily  .  polyethylene glycol  17 g Oral BID  . vitamin B-12  1,000 mcg Oral Daily  . vitamin E  100 Units Oral Daily   Continuous Infusions: . citrate dextrose       LOS: 5 days    Georgette Shell, MD [facility] If 7PM-7AM, please contact night-coverage [email or web address] Password [email or web address] 01/04/2017, 9:55 AM

## 2017-01-04 NOTE — Clinical Social Work Placement (Signed)
   CLINICAL SOCIAL WORK PLACEMENT  NOTE  Date:  01/04/2017  Patient Details  Name: Kenneth Pace MRN: [id number] Date of Birth: [birth year]  Clinical Social Work is seeking post-discharge placement for this patient at the [place] level of care (*CSW will initial, date and re-position this form in  chart as items are completed):  Yes   Patient/family provided with [place] Work Department's list of facilities offering this level of care within the geographic area requested by the patient (or if unable, by the patient's family).  Yes   Patient/family informed of their freedom to choose among providers that offer the needed level of care, that participate in Medicare, Medicaid or managed care program needed by the patient, have an available bed and are willing to accept the patient.  Yes   Patient/family informed of [facility]'s ownership interest in [facility] and [facility], as well as of the fact that they are under no obligation to receive care at these facilities.  PASRR submitted to EDS on 01/04/17     PASRR number received on       Existing PASRR number confirmed on       FL2 transmitted to all facilities in geographic area requested by pt/family on 01/04/17     FL2 transmitted to all facilities within larger geographic area on       Patient informed that his/her managed care company has contracts with or will negotiate with certain facilities, including the following:            Patient/family informed of bed offers received.  Patient chooses bed at       Physician recommends and patient chooses bed at      Patient to be transferred to   on  .  Patient to be transferred to facility by       Patient family notified on   of transfer.  Name of family member notified:        PHYSICIAN Please sign FL2     Additional Comment:    _______________________________________________ Candie Chroman, LCSW 01/04/2017, 3:17  PM

## 2017-01-04 NOTE — [place] FL2 (Signed)
[place] MEDICAID FL2 LEVEL OF CARE SCREENING TOOL     IDENTIFICATION  Patient Name: Kenneth Pace Birthdate: [birth year] Sex: male Admission Date (Current Location): 12/29/2016  [facility] and [place] Number:  [employer] and Address:  [facility], [place] [address], [place], [place] [address]      Provider Number: [id number]  Attending Physician Name and Address:  Georgette Shell, MD  Relative Name and Phone Number:       Current Level of Care: Hospital Recommended Level of Care: [place] Prior Approval Number:    Date Approved/Denied:   PASRR Number: Manual review  Discharge Plan: SNF    Current Diagnoses: Patient Active Problem List   Diagnosis Date Noted  . Depression 12/30/2016  . Leg weakness, bilateral 12/30/2016  . 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 GBS (Guillain Barre syndrome) ([place])   . AIDP (acute inflammatory demyelinating polyneuropathy) ([place]) 11/27/2016  . Weakness 11/20/2016  . Weakness of both arms 10/30/2016  . Numbness 03/18/2016  . Diabetes mellitus without complication ([place]) 62/70/3500  . Eustachian tube dysfunction 02/12/2015  . Acute maxillary sinusitis 02/12/2015  . Wellness examination 08/22/2014  . Alkaline phosphatase elevation 08/22/2014  . Screening for prostate cancer 04/24/2013  . Routine general medical examination at a health care facility 04/10/2012  . BPH (benign prostatic hyperplasia) 02/03/2010  . HEMOCCULT POSITIVE STOOL 05/25/2008  . ELBOW PAIN, RIGHT 07/04/2007  . Dyslipidemia 02/16/2007  . ANXIETY 02/16/2007  . ERECTILE DYSFUNCTION 02/16/2007  . Essential hypertension 02/16/2007  . TESTICULAR MASS, LEFT 02/16/2007  . KNEE PAIN, CHRONIC 02/16/2007  . ADENOIDECTOMY, HX OF 02/16/2007    Orientation RESPIRATION BLADDER Height & Weight     Self, Time, Situation, Place  Normal, Other (Comment)(Has not used cpap since 11/15.)  Indwelling catheter Weight: 224 lb 6.9 oz (101.8 kg) Height:  6' (182.9 cm)  BEHAVIORAL SYMPTOMS/MOOD NEUROLOGICAL BOWEL NUTRITION STATUS  (None) (None) Continent Diet(Heart healthy/carb modified)  AMBULATORY STATUS COMMUNICATION OF NEEDS Skin   Extensive Assist Verbally Skin abrasions                       Personal Care Assistance Level of Assistance  Bathing, Feeding, Dressing Bathing Assistance: Limited assistance Feeding assistance: Limited assistance Dressing Assistance: Limited assistance     Functional Limitations Info  Sight, Hearing, Speech Sight Info: Adequate Hearing Info: Adequate Speech Info: Adequate    SPECIAL CARE FACTORS FREQUENCY  PT (By licensed PT), Blood pressure, OT (By licensed OT)     PT Frequency: 5 x week OT Frequency: 5 x week            Contractures Contractures Info: Not present    Additional Factors Info  Code Status, Allergies, Psychotropic Code Status Info: Full Allergies Info: NKDA Psychotropic Info: Anxiety, Depression: Buspar 30 mg PO QHS.         Current Medications (01/04/2017):  This is the current hospital active medication list Current Facility-Administered Medications  Medication Dose Route Frequency Provider Last Rate Last Dose  . acetaminophen (TYLENOL) tablet 650 mg  650 mg Oral Q6H PRN Ivor Costa, MD   650 mg at 01/03/17 2241  . acetaminophen (TYLENOL) tablet 650 mg  650 mg Oral Q4H PRN Greta Doom, MD      . bisacodyl (DULCOLAX) suppository 10 mg  10 mg Rectal Daily PRN Jani Gravel, MD   10 mg at 01/02/17 2119  . busPIRone (BUSPAR) tablet 30 mg  30 mg Oral QHS Georgette Shell, MD      . citrate dextrose (ACD-A anticoagulant) solution 500 mL  500 mL Intravenous Continuous Greta Doom, MD   500 mL at 01/03/17 1508  . diphenhydrAMINE (BENADRYL) capsule 25 mg  25 mg Oral Q6H PRN Greta Doom, MD      . finasteride (PROSCAR) tablet 5 mg  5 mg Oral Daily Ivor Costa, MD   5 mg at  01/03/17 2238  . heparin injection 1,000 Units  1,000 Units Intracatheter Once Greta Doom, MD      . hydrALAZINE (APRESOLINE) injection 5 mg  5 mg Intravenous Q2H PRN Ivor Costa, MD      . insulin pump   Subcutaneous TID AC, HS, 0200 Cherene Altes, MD   3.5 each at 01/04/17 1120  . lisinopril (PRINIVIL,ZESTRIL) tablet 2.5 mg  2.5 mg Oral Daily Georgette Shell, MD      . multivitamin with minerals tablet 1 tablet  1 tablet Oral Daily Ivor Costa, MD   1 tablet at 01/03/17 2238  . ondansetron (ZOFRAN) tablet 4 mg  4 mg Oral Q6H PRN Ivor Costa, MD       Or  . ondansetron [facility]) injection 4 mg  4 mg Intravenous Q6H PRN Ivor Costa, MD   4 mg at 01/03/17 1010  . polyethylene glycol (MIRALAX / GLYCOLAX) packet 17 g  17 g Oral BID Cherene Altes, MD   17 g at 01/04/17 0839  . senna-docusate (Senokot-S) tablet 1 tablet  1 tablet Oral QHS PRN Ivor Costa, MD   1 tablet at 01/01/17 2218  . traMADol (ULTRAM) tablet 50 mg  50 mg Oral Q6H PRN Jani Gravel, MD   50 mg at 01/03/17 2341  . vitamin B-12 (CYANOCOBALAMIN) tablet 1,000 mcg  1,000 mcg Oral Daily Ivor Costa, MD   1,000 mcg at 01/03/17 2259  . vitamin E capsule 100 Units  100 Units Oral Daily Greta Doom, MD   100 Units at 01/03/17 2238     Discharge Medications: Please see discharge summary for a list of discharge medications.  Relevant Imaging Results:  Relevant Lab Results:   Additional Information SS#: [id number]  Candie Chroman, LCSW

## 2017-01-04 NOTE — Plan of Care (Signed)
Continue current care paln

## 2017-01-04 NOTE — Progress Notes (Signed)
Inpatient Diabetes Program Recommendations  AACE/ADA: New Consensus Statement on Inpatient Glycemic Control (2015)  Target Ranges:  Prepandial:   less than 140 mg/dL      Peak postprandial:   less than 180 mg/dL (1-2 hours)      Critically ill patients:  140 - 180 mg/dL   Lab Results  Component Value Date   GLUCAP 259 (H) 01/04/2017   HGBA1C 7.3 (H) 11/20/2016    Review of Glycemic Control   Met with patient @ bedside. Patient states his CBGs are low on occasion and reports CBGs to endocrinologist Dr. Loanne Drilling. Patient changed his insulin pump insertion site yesterday. RN printed insulin pump contract and flowsheet. Reviewed with patient and RN orders to check 0200 CBG. Will follow during hospitalization.  Thank you, Nani Gasser. Kikuye Korenek, RN, MSN, CDE  Diabetes Coordinator Inpatient Glycemic Control Team Team Pager [phone] (8am-5pm) 01/04/2017 3:16 PM

## 2017-01-05 LAB — COMPREHENSIVE METABOLIC PANEL
ALBUMIN: 4 g/dL (ref 3.5–5.0)
ALK PHOS: 85 U/L (ref 38–126)
ALT: 17 U/L (ref 17–63)
AST: 21 U/L (ref 15–41)
Anion gap: 6 (ref 5–15)
BUN: 17 mg/dL (ref 6–20)
CO2: 28 mmol/L (ref 22–32)
Calcium: 9 mg/dL (ref 8.9–10.3)
Chloride: 106 mmol/L (ref 101–111)
Creatinine, Ser: 0.73 mg/dL (ref 0.61–1.24)
GFR calc non Af Amer: >60 mL/min (ref >60)
Glucose, Bld: 118 mg/dL — ABNORMAL HIGH (ref 65–99)
Potassium: 4 mmol/L (ref 3.5–5.1)
SODIUM: 140 mmol/L (ref 135–145)
Total Bilirubin: 0.7 mg/dL (ref 0.3–1.2)
Total Protein: 5.4 g/dL — ABNORMAL LOW (ref 6.5–8.1)

## 2017-01-05 LAB — CBC
HCT: 44.8 % (ref 39.0–52.0)
HEMOGLOBIN: 15.8 g/dL (ref 13.0–17.0)
MCH: 30.9 pg (ref 26.0–34.0)
MCHC: 35.3 g/dL (ref 30.0–36.0)
MCV: 87.5 fL (ref 78.0–100.0)
Platelets: 107 10*3/uL — ABNORMAL LOW (ref 150–400)
RBC: 5.12 MIL/uL (ref 4.22–5.81)
RDW: 13.4 % (ref 11.5–15.5)
WBC: 8.7 10*3/uL (ref 4.0–10.5)

## 2017-01-05 LAB — PROTIME-INR
INR: 1.96
Prothrombin Time: 22.1 seconds — ABNORMAL HIGH (ref 11.4–15.2)

## 2017-01-05 LAB — GLUCOSE, CAPILLARY
GLUCOSE-CAPILLARY: 154 mg/dL — AB (ref 65–99)
GLUCOSE-CAPILLARY: 150 mg/dL — AB (ref 65–99)
GLUCOSE-CAPILLARY: 98 mg/dL (ref 65–99)
GLUCOSE-CAPILLARY: 107 mg/dL — AB (ref 65–99)
Glucose-Capillary: 32 mg/dL — CL (ref 65–99)
Glucose-Capillary: 146 mg/dL — ABNORMAL HIGH (ref 65–99)

## 2017-01-05 LAB — POCT I-STAT, CHEM 8
BUN: 20 mg/dL (ref 6–20)
CALCIUM ION: 1.22 mmol/L (ref 1.15–1.40)
CHLORIDE: 100 mmol/L — AB (ref 101–111)
Creatinine, Ser: 0.7 mg/dL (ref 0.61–1.24)
GLUCOSE: 115 mg/dL — AB (ref 65–99)
HCT: 43 % (ref 39.0–52.0)
HEMOGLOBIN: 14.6 g/dL (ref 13.0–17.0)
Potassium: 3.9 mmol/L (ref 3.5–5.1)
SODIUM: 141 mmol/L (ref 135–145)
TCO2: 27 mmol/L (ref 22–32)

## 2017-01-05 LAB — HTLV I+II ANTIBODIES, (EIA), BLD: HTLV I/II AB: POSITIVE — AB

## 2017-01-05 LAB — HTLV-I/II IMMUNOBLOT
HTLV-I IB: NEGATIVE
HTLV-II IB: NEGATIVE

## 2017-01-05 LAB — CK: Total CK: 55 U/L (ref 49–397)

## 2017-01-05 MED ORDER — DIPHENHYDRAMINE HCL 25 MG PO CAPS: 25.0000 mg | ORAL_CAPSULE | Freq: Four times a day (QID) | ORAL | Status: DC | PRN

## 2017-01-05 MED ORDER — CALCIUM CARBONATE ANTACID 500 MG PO CHEW
2.0000 | CHEWABLE_TABLET | ORAL | Status: DC
  Administered 2017-01-05: 400 mg via ORAL
  Filled 2017-01-05: qty 2

## 2017-01-05 MED ORDER — ACD FORMULA A 0.73-2.45-2.2 GM/100ML VI SOLN
Status: AC
Start: 2017-01-05 — End: 2017-01-05
  Filled 2017-01-05: qty 500

## 2017-01-05 MED ORDER — ACD FORMULA A 0.73-2.45-2.2 GM/100ML VI SOLN
Status: AC
Start: 2017-01-05 — End: 2017-01-05
  Administered 2017-01-05: 500 mL via INTRAVENOUS
  Filled 2017-01-05: qty 500

## 2017-01-05 MED ORDER — HEPARIN SODIUM (PORCINE) 1000 UNIT/ML IJ SOLN: 1000.0000 [IU] | Freq: Once | INTRAMUSCULAR | Status: DC

## 2017-01-05 MED ORDER — SODIUM CHLORIDE 0.9 % IV SOLN
INTRAVENOUS | Status: AC
  Administered 2017-01-05 (×4): via INTRAVENOUS_CENTRAL
  Filled 2017-01-05 (×4): qty 200

## 2017-01-05 MED ORDER — CALCIUM CARBONATE ANTACID 500 MG PO CHEW
CHEWABLE_TABLET | ORAL | Status: AC
  Administered 2017-01-05: 400 mg via ORAL
  Filled 2017-01-05: qty 4

## 2017-01-05 MED ORDER — ACD FORMULA A 0.73-2.45-2.2 GM/100ML VI SOLN
500.0000 mL | Status: DC
  Administered 2017-01-05: 500 mL via INTRAVENOUS
  Filled 2017-01-05: qty 500

## 2017-01-05 MED ORDER — SODIUM CHLORIDE 0.9 % IV SOLN
2.0000 g | Freq: Once | INTRAVENOUS | Status: AC
  Administered 2017-01-05: 2 g via INTRAVENOUS
  Filled 2017-01-05: qty 20

## 2017-01-05 MED ORDER — ACD FORMULA A 0.73-2.45-2.2 GM/100ML VI SOLN
Status: AC
  Filled 2017-01-05: qty 500

## 2017-01-05 MED ORDER — ACETAMINOPHEN 325 MG PO TABS: 650.0000 mg | ORAL_TABLET | ORAL | Status: DC | PRN

## 2017-01-05 NOTE — Progress Notes (Signed)
Inpatient Rehabilitation  Met with patient to discuss team's recommendation for IP Rehab.  Shared booklets, insurance verification letter, and answered questions.  Patient is eager to regain his independence.  Plan to continue to follow for timing of medical readiness, insurance authorization, and IP Rehab bed availability.  Hopeful for admission 01/11/17 following completion of plasmapheresis; as a result, insurance was initiated today.  Call with questions.   Carmelia Roller., CCC/SLP Admission Coordinator  [place]  Cell [phone]

## 2017-01-05 NOTE — Clinical Social Work Note (Signed)
30 day note for PASARR in chart for MD to sign. MD aware.   Kenneth Pace, [place]

## 2017-01-05 NOTE — Progress Notes (Signed)
NIF -40, VC 2.7L good effort

## 2017-01-05 NOTE — Plan of Care (Signed)
Care plan updated.

## 2017-01-05 NOTE — Progress Notes (Signed)
PT Cancellation Note  Patient Details Name: Kenneth Pace MRN: [id number] DOB: [birth year]   Cancelled Treatment:    Reason Eval/Treat Not Completed: Patient at procedure or test/unavailable. Off floor for hemodialysis for apheresis. Will follow-up for PT treatment as time allows.  Mabeline Caras, PT, DPT Acute Rehab Services  Pager: [place] 01/05/2017, 10:02 AM

## 2017-01-05 NOTE — Plan of Care (Signed)
Continue current care plan 

## 2017-01-05 NOTE — Progress Notes (Signed)
PROGRESS NOTE    PRESS CASALE  QPY:[id number] DOB: 02-07-1951 DOA: 12/29/2016 PCP: Renato Shin, MD  Brief [phone] y.o.malewithmedicalhistorysignificant for but not limited tosubacute inflammatory polyradiculoneuropathy, DM on insulin pump,presenting with progressive weakness, numbness and hyperreflexia and history of demyelinating neuropathy on EMG,and felt to have myeloneuropathy. 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 Patient has had MRI which indicated some element of cervical myelopathy due to his constellation ofsigns/symptoms.   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  ?to have cervical decompression if patient doesn't improve. 2]type 2 diabetes patient has an insulin pump in place.   Blood sugars more stable.  Patient has a insulin pump in place.  No more hypoglycemic episodes.  3]HTNon lisinopril.  Lisinopril has been stopped yesterday due to low blood pressure. 4]BPHon Proscar 5]dyslipidemia continue was on Zetia and Crestor as an outpatient which has not been restarted. 6]depression on BuSpar.  Currently he is on BuSpar twice a day changed to BuSpar nightly as he was taking it home.    DVT prophylaxis:  SCD Code Status:FULL Family Communication: NONE Disposition Plan:  Plan is for him to be discharged to inpatient rehab once done with plasmapheresis.  Follow recommendations from neurology.  There is a concern about cervical myelopathy and severe neural foraminal stenosis C3 through C.,  If this is causing any of his symptoms.  Neurosurgery on board.  Consultants neurology, neurosurgery, physical medicine and rehab  Procedures: None Antimicrobials: None  Subjective: Feels about the same has not really feel any improvement has been made to try to walk with PT yesterday.  No chest pain shortness of breath no nausea vomiting or diarrhea..   Objective: Vitals:   01/05/17 0758 01/05/17 0900 01/05/17 0916 01/05/17 0937  BP:   138/71 119/68  Pulse:   76 82  Resp:  15 12 14   Temp:  97.8 F (36.6 C) 97.8 F (36.6 C) 97.8 F (36.6 C)  TempSrc:  Oral    SpO2:  99% 99%   Weight: 103.3 kg (227 lb 11.8 oz)     Height: 6' (1.829 m)       Intake/Output Summary (Last 24 hours) at 01/05/2017 0938 Last data filed at 01/05/2017 0827 Gross per 24 hour  Intake 720 ml  Output 1275 ml  Net -555 ml   Filed Weights   01/04/17 0451 01/05/17 0234 01/05/17 0758  Weight: 101.8 kg (224 lb 6.9 oz) 103.3 kg (227 lb 11.8 oz) 103.3 kg (227 lb 11.8 oz)    Examination:  General exam: Appears calm and comfortable  Respiratory system: Clear to auscultation. Respiratory effort normal. Cardiovascular system: S1 & S2 heard, RRR. No JVD, murmurs, rubs, gallops or clicks. No pedal edema. Gastrointestinal system: Abdomen is nondistended, soft and nontender. No organomegaly or masses felt. Normal bowel sounds heard. Central nervous system: Alert and oriented. No focal neurological deficits. Skin: No rashes,  lesions or ulcers Psychiatry: Judgement and insight appear normal. Mood & affect appropriate.     Data Reviewed: I have personally reviewed following labs and imaging studies  CBC: Recent Labs  Lab  12/29/16 2207 12/30/16 0311 12/31/16 0434 01/02/17 1544 01/02/17 1617 01/03/17 1356  WBC 8.5 8.7 8.2  --  9.6  --   NEUTROABS  --   --   --   --  6.7  --   HGB 16.8 15.7 15.7 11.9* 16.9 17.3*  HCT 47.2 43.8 45.4 35.0* 47.0 51.0  MCV 86.8 86.6 87.5  --  86.6  --   PLT 195 157 169  --  182  --    Basic Metabolic Panel: Recent Labs  Lab 12/29/16 2207 12/30/16 0311 12/31/16 0434 01/02/17 1544 01/03/17 1356  NA 136 137 138 143 135  K 3.6 3.3* 3.8 2.7* 4.6  CL 101 105 106 108 98*  CO2 26 27 26   --   --   GLUCOSE 118* 70 141* 161* 342*  BUN 21* 23* 15 14 34*  CREATININE 0.90 0.92 0.82 0.30* 0.90  CALCIUM 9.2 9.0 8.9  --   --   MG  --   --  2.0  --   --   PHOS  --   --  3.6  --   --    GFR: Estimated Creatinine Clearance: 103.1 mL/min (by C-G formula based on SCr of 0.9 mg/dL). Liver Function Tests: Recent Labs  Lab 12/31/16 0434  AST 26  ALT 27  ALKPHOS 152*  BILITOT 0.7  PROT 6.6  ALBUMIN 3.5   No results for input(s): LIPASE, AMYLASE in the last 168 hours. No results for input(s): AMMONIA in the last 168 hours. Coagulation Profile: Recent Labs  Lab 12/29/16 2207  INR 1.04   Cardiac Enzymes: Recent Labs  Lab 12/30/16 0311  CKTOTAL 223   BNP (last 3 results) No results for input(s): PROBNP in the last 8760 hours. HbA1C: No results for input(s): HGBA1C in the last 72 hours. CBG: Recent Labs  Lab 01/04/17 1702 01/04/17 2128 01/04/17 2216 01/05/17 0233 01/05/17 0742  GLUCAP 192* 67 103* 146* 154*   Lipid Profile: No results for input(s): CHOL, HDL, LDLCALC, TRIG, CHOLHDL, LDLDIRECT in the last 72 hours. Thyroid Function Tests: No results for input(s): TSH, T4TOTAL, FREET4, T3FREE, THYROIDAB in the last 72 hours. Anemia Panel: No results for input(s): VITAMINB12, FOLATE, FERRITIN, TIBC, IRON, RETICCTPCT in the last 72 hours. Sepsis Labs: No results for input(s): PROCALCITON, LATICACIDVEN in the last 168 hours.  Recent Results (from the past 240  hour(s))  MRSA PCR Screening     Status: None   Collection Time: 12/30/16  2:19 PM  Result Value Ref Range Status   MRSA by PCR NEGATIVE NEGATIVE Final    Comment:        The GeneXpert MRSA Assay (FDA approved for NASAL specimens only), is one component of a comprehensive MRSA colonization surveillance program. It is not intended to diagnose MRSA infection nor to guide or monitor treatment for MRSA infections.          Radiology Studies: No results found.      Scheduled Meds: . busPIRone  30 mg Oral QHS  . calcium carbonate  2 tablet Oral Q3H  . finasteride  5 mg Oral Daily  . heparin  1,000 Units Intracatheter Once  . heparin  1,000 Units Intracatheter Once  . insulin pump   Subcutaneous TID AC, HS, 0200  . lisinopril  2.5 mg Oral Daily  . multivitamin with minerals  1 tablet Oral Daily  . polyethylene glycol  17 g Oral BID  . vitamin B-12  1,000 mcg Oral Daily  . vitamin E  100 Units Oral Daily   Continuous Infusions: . therapeutic plasma exchange solution 60 mL/hr at 01/05/17 0916  . calcium gluconate IVPB 2 g (01/05/17 0934)  . citrate dextrose    . citrate dextrose 500 mL (01/05/17 0936)     LOS: 6 days      Georgette Shell, MD [facility]  If 7PM-7AM, please contact night-coverage [email or web address] Password [facility] 01/05/2017, 9:38 AM

## 2017-01-05 NOTE — Progress Notes (Signed)
Pt being transported to hemodialysis for apheresis

## 2017-01-05 NOTE — Progress Notes (Signed)
RT NOTE:  Pt has home CPAP setup @ bedside. Pt is able to manage machine. RT filled humidity chamber with sterile water. Machine within reach of patient.

## 2017-01-05 NOTE — Care Management Note (Signed)
Case Management Note  Patient Details  Name: Kenneth Pace MRN: [id number] Date of Birth: [birth year]  Subjective/Objective:    Pt is a readmit - with progressive weakness                 Action/Plan:   PTA from home.  Pt actively receiving Plasmapheresis.  CIR recommended - CSW following for back up plan.  CIR planning to pursue ins auth and possibly admit Monday am    Expected Discharge Date:  01/04/17               Expected Discharge Plan:  IP Rehab Facility  In-House Referral:  Clinical Social Work  Discharge planning Services  CM Consult  Post Acute Care Choice:    Choice offered to:     DME Arranged:    DME Agency:     HH Arranged:    [place] Agency:     Status of Service:  In process, will continue to follow  If discussed at Long Length of Stay Meetings, dates discussed:    Additional Comments:  Maryclare Labrador, RN 01/05/2017, 3:26 PM

## 2017-01-05 NOTE — Progress Notes (Signed)
RT NOTE:  NIF: -40 VC: 3.0 Pt preformed 3x with good effort

## 2017-01-06 DIAGNOSIS — F325 Major depressive disorder, single episode, in full remission: Secondary | ICD-10-CM

## 2017-01-06 DIAGNOSIS — Z452 Encounter for adjustment and management of vascular access device: Secondary | ICD-10-CM

## 2017-01-06 DIAGNOSIS — N400 Enlarged prostate without lower urinary tract symptoms: Secondary | ICD-10-CM

## 2017-01-06 LAB — GLUCOSE, CAPILLARY
GLUCOSE-CAPILLARY: 192 mg/dL — AB (ref 65–99)
GLUCOSE-CAPILLARY: 76 mg/dL (ref 65–99)
GLUCOSE-CAPILLARY: 109 mg/dL — AB (ref 65–99)
Glucose-Capillary: 103 mg/dL — ABNORMAL HIGH (ref 65–99)
Glucose-Capillary: 94 mg/dL (ref 65–99)

## 2017-01-06 MED ORDER — MAGNESIUM HYDROXIDE 400 MG/5ML PO SUSP
30.0000 mL | Freq: Every day | ORAL | Status: DC | PRN
  Administered 2017-01-06: 30 mL via ORAL
  Filled 2017-01-06: qty 30

## 2017-01-06 NOTE — PMR Pre-admission (Signed)
PMR Admission Coordinator Pre-Admission Assessment  Patient: Kenneth Pace is an 66 y.o., male MRN: [id number] DOB: [birth year] Height: 6' (182.9 cm) Weight: 102.4 kg (225 lb 12 oz)              Insurance Information HMO:     PPO: X     PCP:      IPA:      80/20:      OTHER:  PRIMARY: [employer] for [employer]      Policy#: [id number]      Subscriber: Self CM Name: Rochele Raring      Phone#: [phone]     Fax#: [phone] Pre-Cert#: [id number]  93/57/0/-17/7/93 with updates due to CM on 01/18/17    Employer: [employer] Benefits:  Phone #: Verified online     Name: [email or web address] Eff. 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 [name] readmitted on 12/29/16 withreports of progressive decline that started during his trip with tightness around abdomen with difficulty breathing,progressive difficulty walking with numbness, difficulty swallowing and inability to void.Has been evaluated by neurology with evidence of demyelinating neuropathy on EMG. Dr. Rory Percy recommended evaluation  for polyradiculoneuropathy v/s transverse myelitis--doubt GBS. MRI thoracic spine revealed multifocal disc protrusions with cord flattening T5-T10 without significant stenosis. Patient with tetraplegia and neuro felt exam due to demyelinating  myeloneuropathy.   Work up underway to rule out paraneoplastic panel.LP done yesterday revealing elevated protein 121 and WBC - 7. MRI brain/cervical spinerepeated and was negative foracute intracranial abnormality and multilevel moderate stenosis C3/4 and C5/6 with mild cord deformity but no signal changes. CT abdomen/pelvis done to rule out paraneoplastic syndrome and was negative for acute abnormality. Incidental findings of fairly large amount of stool in colon,  thickened bladder wall --favor chronic neurogenic bladder and enlarged prostate causing mass effect on bladder.    Dr. Kathyrn Sheriff consulted and recommended trial of plasma exchange as symptoms favor AIDP but could have component of symptomatic cervical myelopathy. If he does not show improvement to consider decompression of cervical spondylosis. He completed 5 rounds of treatment and is showing some improvement.  Therapies recommending IP Rehab for post acute therapies and patient admitted 01/11/17.       Past Medical History  Past Medical History:  Diagnosis Date  . ADENOIDECTOMY, HX OF 02/16/2007  . ANXIETY 02/16/2007  . BENIGN PROSTATIC HYPERTROPHY, WITH OBSTRUCTION 02/03/2010  . DIABETES MELLITUS, TYPE I, UNCONTROLLED 12/29/2006  . ERECTILE DYSFUNCTION 02/16/2007  . HYPERLIPIDEMIA 02/16/2007  . HYPERTENSION 02/16/2007  . TESTICULAR MASS, LEFT  02/16/2007    Family History  family history includes Dementia in his mother; Diabetes Mellitus I in his mother; Healthy in his sister; Hypertension in his father; Rheum arthritis in his brother.  Prior Rehab/Hospitalizations:  Has the patient had major surgery during 100 days prior to admission? No  Current Medications   Current  Facility-Administered Medications:  .  acetaminophen (TYLENOL) tablet 650 mg, 650 mg, Oral, Q6H PRN, Ivor Costa, MD, 650 mg at 01/03/17 2241 .  bisacodyl (DULCOLAX) suppository 10 mg, 10 mg, Rectal, Daily PRN, Jani Gravel, MD, 10 mg at 01/02/17 2119 .  busPIRone (BUSPAR) tablet 30 mg, 30 mg, Oral, QHS, Georgette Shell, MD, 30 mg at 01/10/17 2225 .  finasteride (PROSCAR) tablet 5 mg, 5 mg, Oral, Daily, Ivor Costa, MD, 5 mg at 01/10/17 2225 .  hydrALAZINE (APRESOLINE) injection 5 mg, 5 mg, Intravenous, Q2H PRN, Ivor Costa, MD .  insulin pump, , Subcutaneous, TID AC, HS, 0200, Cherene Altes, MD, 3 each at 01/11/17 1211 .  lisinopril (PRINIVIL,ZESTRIL) tablet 2.5 mg, 2.5 mg, Oral, Daily, 2.5 mg at 01/10/17 2225 **AND** [DISCONTINUED] hydrochlorothiazide (MICROZIDE) capsule 12.5 mg, 12.5 mg, Oral, Daily, Cherene Altes, MD, 12.5 mg at 01/02/17 2120 .  magnesium hydroxide (MILK OF MAGNESIA) suspension 30 mL, 30 mL, Oral, Daily PRN, Bodenheimer, Charles A, NP, 30 mL at 01/06/17 2203 .  multivitamin with minerals tablet 1 tablet, 1 tablet, Oral, Daily, Ivor Costa, MD, 1 tablet at 01/10/17 2225 .  ondansetron (ZOFRAN) tablet 4 mg, 4 mg, Oral, Q6H PRN **OR** ondansetron (ZOFRAN) injection 4 mg, 4 mg, Intravenous, Q6H PRN, Ivor Costa, MD, 4 mg at 01/03/17 1010 .  polyethylene glycol (MIRALAX / GLYCOLAX) packet 17 g, 17 g, Oral, BID, Cherene Altes, MD, 17 g at 01/07/17 2152 .  senna-docusate (Senokot-S) tablet 1 tablet, 1 tablet, Oral, QHS PRN, Ivor Costa, MD, 1 tablet at 01/01/17 2218 .  traMADol (ULTRAM) tablet 50 mg, 50 mg, Oral, Q6H PRN, Jani Gravel, MD, 50 mg at 01/03/17 2341 .  vitamin B-12 (CYANOCOBALAMIN) tablet 1,000 mcg, 1,000 mcg, Oral, Daily, Ivor Costa, MD, 1,000 mcg at 01/10/17 2225 .  vitamin E capsule 100 Units, 100 Units, Oral, Daily, Greta Doom, MD, 100 Units at 01/10/17 2226  Patients Current Diet: Diet heart healthy/carb modified Room service appropriate? 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 He was active enjoyed his dogs and rebuilding car engines.  Home Assistive Devices / Equipment Home Assistive Devices/Equipment: Gilford Rile (specify type) Home Equipment: Cane - single point, Crutches, [place] 2 wheels, Walker - 4 wheels, Electric scooter  Prior Device Use: Indicate devices/aids used by the patient prior to current illness, exacerbation or injury? None then there has been a slow decline since late summer, into fall and progressed to a cane and then a walker.  Prior Functional Level Prior Function Level of Independence: Needs assistance Gait / Transfers Assistance Needed: Reports he has been requiring increased assist for gait with RW and has had a fall secondary to weakness.  ADL's / Homemaking Assistance Needed: Has been sponge bathing with assist from his wife. Reports increasing difficulty with dressing.   Self Care: Did the patient need help bathing, dressing, using the toilet  or eating? Independent  Indoor Mobility: Did the patient need assistance with walking from room to room (with or without device)? Independent  Stairs: Did the patient need assistance with internal or external stairs (with or without device)? Independent  Functional Cognition: Did the patient need help planning regular tasks such as shopping or remembering to take medications? Independent  Current Functional Level Cognition  Overall Cognitive Status: Within Functional Limits for tasks assessed Orientation Level: Oriented X4 General Comments: Stuttering is pt's baseline    Extremity Assessment (includes Sensation/Coordination)  Upper Extremity Assessment: RUE  deficits/detail, LUE deficits/detail RUE Deficits / Details: Greatest difficulty with purposeful release. Decreased coordination with opposition. Functional shoulder AROM. 3/5 strength at elbow with 2/5 grasp strength. Decreased coordination.  RUE Sensation: decreased light touch RUE Coordination: decreased fine motor, decreased gross motor LUE Deficits / Details: Decreased coordination with opposition. Functional shoulder AROM. 3/5 strength grossly at elbow. 2-/5 grasp strength.  LUE Coordination: decreased fine motor  Lower Extremity Assessment: Defer to PT evaluation RLE Deficits / Details: Only able to perform partial heel slide in supine secondary to weakness. 2-/5 in hip flexors; 3/5 in ankles.  LLE Deficits / Details: Only able to perform partial heel slide in supine secondary to weakness. 2-/5 in hip flexors, and 3/5 in ankles.     ADLs  Overall ADL's : Needs assistance/impaired Eating/Feeding: Bed level, Supervision/ safety Eating/Feeding Details (indicate cue type and reason): with built-up handles Grooming: Minimal assistance, Wash/dry hands, Wash/dry face, Oral care, Standing Grooming Details (indicate cue type and reason): difficulty opening toothpaste and to squeeze toothpaste container Upper Body Bathing: Bed level, Moderate assistance Lower Body Bathing: Maximal assistance, Bed level Upper Body Dressing : Bed level, Moderate assistance Lower Body Dressing: Maximal assistance, Bed level Toilet Transfer: Minimal assistance, Ambulation Toilet Transfer Details (indicate cue type and reason): Noted compensatory movements to achieve stability on his feet.  Functional mobility during ADLs: Minimal assistance, Rolling walker General ADL Comments: Continue to note intrinsic minus positioning in R hand especially with fatigue this session.     Mobility  Overal bed mobility: Needs Assistance Bed Mobility: Supine to Sit, Sit to Supine Rolling: Min guard Sidelying to sit: Min  assist Supine to sit: Min assist Sit to supine: Min guard General bed mobility comments: definite use of bed rail, increased time, labored effort, HOB flat    Transfers  Overall transfer level: Needs assistance Equipment used: Rolling walker (2 wheeled) Transfers: Sit to/from Stand Sit to Stand: Mod assist Stand pivot transfers: Min assist, Mod assist, +2 physical assistance General transfer comment: labored effort, modA to power up via pushing up from bed and to maintain balance during transition of hands    Ambulation / Gait / Stairs / Wheelchair Mobility  Ambulation/Gait Ambulation/Gait assistance: Museum/gallery curator (Feet): 100 Feet Assistive device: Rolling walker (2 wheeled) Gait Pattern/deviations: Step-through pattern, Decreased stride length, Decreased dorsiflexion - right, Decreased dorsiflexion - left, Trunk flexed General Gait Details: pt with non-fluient gait pattern due to inconsistent foot clearence. Pt very dependent on UEs but improved from last session. pt compensates with hip hiking as well Gait velocity: decreased Gait velocity interpretation: <1.8 ft/sec, indicative of risk for recurrent falls    Posture / Balance Dynamic Sitting Balance Sitting balance - Comments: Requires assist for dynamic balance.  Balance Overall balance assessment: Needs assistance Sitting-balance support: Feet unsupported, No upper extremity supported Sitting balance-Leahy Scale: Fair Sitting balance - Comments: Requires assist for dynamic balance.  Standing balance support: Bilateral upper  extremity supported Standing balance-Leahy Scale: Poor Standing balance comment: Relies on external assist to stand statically.     Special needs/care consideration BiPAP/CPAP: Yes, CPAP CPM: No Continuous Drip IV: No Dialysis: No        Life Vest: No Oxygen: No Special Bed: No Trach Size: No Wound Vac (area): No       Skin: Dry, Abrasions to bilateral legs and toes                                Bowel mgmt: Continent, last BM 01/10/17 Bladder mgmt: Acute urinary retention with foley catheter placed initially, voiding trials, and now continent  Diabetic mgmt: Yes, managed with an insulin pump prior to admission      Previous Home Environment Living Arrangements: Spouse/significant other Available Help at Discharge: Family, Available 24 hours/day Type of Home: House Home Layout: Two level, Bed/bath upstairs, Able to live on main level with bedroom/bathroom Home Access: Stairs to enter Entrance Stairs-Rails: None Entrance Stairs-Number of Steps: 3 Bathroom Shower/Tub: Chiropodist: Standard Home Care Services: No Additional Comments: Reports he has not been going upstairs as it has become too difficult.   Discharge Living Setting Plans for Discharge Living Setting: Patient's home, Lives with (comment)(Spouse) Type of Home at Discharge: House Discharge Home Layout: Two level, Able to live on main level with bedroom/bathroom Alternate Level Stairs-Rails: Can reach both Alternate Level Stairs-Number of Steps: 17 Discharge Home Access: Stairs to enter Entrance Stairs-Rails: None Entrance Stairs-Number of Steps: 3 Discharge Bathroom Shower/Tub: Tub/shower unit, Curtain Discharge Bathroom Toilet: Standard Discharge Bathroom Accessibility: Yes How Accessible: Accessible via walker Does the patient have any problems obtaining your medications?: No  Social/Family/Support Systems Patient Roles: Spouse, Parent Contact Information: Spouse Lexicographer  Anticipated Caregiver: Spouse, Manufacturing systems engineer Information: cell:[phone] Ability/Limitations of Caregiver: Spouse works from home  Caregiver Availability: 24/7 Discharge Plan Discussed with Primary Caregiver: Yes Is Caregiver In Agreement with Plan?: Yes Does Caregiver/Family have Issues with Lodging/Transportation while Pt is in Rehab?: No  Goals/Additional  Needs Patient/Family Goal for Rehab: PT/OT: Mod I -Supervision  Expected length of stay: 11-17 days  Cultural Considerations: None Dietary Needs: Heart Healthy/Carb Mod. diet restrictions  Equipment Needs: TBD Special Service Needs: Pt with insulin pump Pt/Family Agrees to Admission and willing to participate: Yes Program Orientation Provided & Reviewed with Pt/Caregiver Including Roles  & Responsibilities: Yes Additional Information Needs: None  Decrease burden of Care through IP rehab admission: No  Possible need for SNF placement upon discharge: No  Patient Condition: This patient's medical and functional status has changed since the consult dated: 01/01/17 in which the Rehabilitation Physician determined and documented that the patient's condition is appropriate for intensive rehabilitative care in an inpatient rehabilitation facility. See "History of Present Illness" (above) for medical update. Functional changes are: Mod A transfers and Min A 100 feet gait with rolling walker. Patient's medical and functional status update has been discussed with the Rehabilitation physician and patient remains appropriate for inpatient rehabilitation. Will admit to inpatient rehab today.  Preadmission Screen Completed By:  Gunnar Fusi, 01/11/2017 2:41 PM ______________________________________________________________________   Discussed status with Dr. Posey Pronto on 01/11/17 at 1530 and received telephone approval for admission today.  Admission Coordinator:  Gunnar Fusi, time 1530/Date 01/11/17

## 2017-01-06 NOTE — Progress Notes (Signed)
Inpatient Rehabilitation  Continuing to follow along for potential IP Rehab admission Monday, 01/11/17 pending medical stability and insurance authorization.  Will have an Admissions Coordinator to coordinate acute therapies to see 48 hours prior to admission in order to obtain insurance approval.  Discussed with patient and spouse, who are in strong favor of this plan.  Carmelia Roller., CCC/SLP Admission Coordinator  [facility]  Cell [phone]

## 2017-01-06 NOTE — Progress Notes (Signed)
RT Note: Patient performed respiratory mechanic exercises well with no complications. The results are as follows:  Nif: -40 Vc- 2.9 Liters  Good effort was given by the patient. Rt will continue to monitor.

## 2017-01-06 NOTE — Progress Notes (Signed)
Nutrition Brief Note  RD consulted to provide snack recommendations. Pt on an insulin pump and managing it during hospitalization.  Wt Readings from Last 15 Encounters:  01/06/17 227 lb 8 oz (103.2 kg)  11/27/16 230 lb (104.3 kg)  11/20/16 228 lb 9.6 oz (103.7 kg)  11/20/16 237 lb (107.5 kg)  10/30/16 240 lb 6.4 oz (109 kg)  08/12/16 240 lb (108.9 kg)  03/18/16 242 lb (109.8 kg)  01/21/16 237 lb (107.5 kg)  12/24/15 237 lb (107.5 kg)  08/21/15 236 lb (107 kg)  04/05/15 234 lb (106.1 kg)  02/22/15 229 lb (103.9 kg)  02/12/15 232 lb (105.2 kg)  01/03/15 232 lb (105.2 kg)  08/22/14 237 lb (107.5 kg)   66 y.o. male with medical history significant for but not limited to subacute inflammatory polyradiculoneuropathy, DM on insulin pump, presenting with progressive weakness, numbness and hyperreflexia and history of demyelinating neuropathy on EMG, and felt to have myeloneuropathy.  Pt admitted with progressive spastic quadriparesis/ demyelinating neuropathy.   11/15- lumbar puncture 11/16- hypoglycemic episode  Per neurosurgery, questionable myeloneuropathy.   Attempted to speak with pt x 3, however, unavailable (in with therapy or CIR admissions).   Case discussed with RN, who reports pt is eating well. She shares that pt manages his own insulin pump and has experienced some hypoglycemic episodes; requesting snacks to assist with hypoglycemia prevention.  Chart reviewed; wt has been stable. Observed pt, who did not appears to have signs of fat or muscle depletion  Medications reviewed and include MVI, vitamin E, and vitamin B-12.   Labs reviewed: CBGS: 94-107 (pt on insulin pump).   Body mass index is 30.85 kg/m. Patient meets criteria for obesity, class I based on current BMI.   Current diet order is Heart Healthy/ Carb Modified, patient is consuming approximately 50-100%% of meals at this time. Labs and medications reviewed.   No nutrition interventions warranted at this  time. If nutrition issues arise, please consult RD.   Waverly Chavarria A. Jimmye Norman, RD, LDN, CDE Pager: [phone] After hours Pager: [phone]
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
## 2017-01-06 NOTE — Progress Notes (Signed)
PT performed  NIF and VC with good effort.   Nif: -40 VC: 3.0 L  RT will continue to monitor.

## 2017-01-06 NOTE — Progress Notes (Signed)
PROGRESS NOTE    Kenneth Pace  OIN:[id number] DOB: 03/25/1950 DOA: 12/29/2016 PCP: Renato Shin, MD  Brief [phone] y.o.malewithmedicalhistorysignificant for but not limited tosubacute inflammatory polyradiculoneuropathy, DM on insulin pump,presenting with progressive weakness, numbness and hyperreflexia and history of demyelinating neuropathy on EMG,and felt to have myeloneuropathy. 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 Patient has had MRI which indicated some element of cervical myelopathy due to his constellation ofsigns/symptoms.  Patient was seen and examined at his bedside. He is very optimistic about possible inpatient rehab and has been workingdiligently with PT. Lots of improvement in his strength. Has no complaints this morning.   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 patient followed by neurology/NEUROSURGERY  -MRI of the cervical spine shows moderate to severe neural foraminal stenosis at the level of C3 through to C6 -plasmapheresis every other day to finish 5 courses  Type 2 diabetes on insulin pump    -Blood sugars stable -A1C 7.3 (11/20/16)   Essential HTN -Stable -lisinopril  BPH -Proscar  Dyslipidemia -Zetia and Crestor held until ore stable  depression  -BuSpar  DVT prophylaxis: SCD Code Status:FULL Family Communication: No family memebers at bedside Disposition Plan:  Plan is for him to be discharged to inpatient rehab once done with plasmapheresis.  Follow  recommendations from neurology.  Neurosurgery following.  Consultants neurology, neurosurgery, physical medicine and rehab  Procedures: None Antimicrobials: None   Objective: Vitals:   01/05/17 1931 01/05/17 2309 01/06/17 0313 01/06/17 0457  BP: 121/63 115/70 135/73   Pulse: 87 80 69   Resp: 12 17 15    Temp: 98.4 F (36.9 C) 97.9 F (36.6 C) 97.8 F (36.6 C)   TempSrc: Oral Oral Oral   SpO2: 96% 97% 98%   Weight:    103.2 kg (227 lb 8 oz)  Height:        Intake/Output Summary (Last 24 hours) at 01/06/2017 4765 Last data filed at 01/06/2017 0458 Gross per 24 hour  Intake 500 ml  Output 425 ml  Net 75 ml   Filed Weights   01/05/17 0234 01/05/17 0758 01/06/17 0457  Weight: 103.3 kg (227 lb 11.8 oz) 103.3 kg (227 lb 11.8 oz) 103.2 kg (227 lb 8 oz)    Examination:  General exam: 66 yo CM WD WN NAD. A&O x4 Respiratory system: Clear to auscultation. Respiratory effort normal. Cardiovascular system: S1 & S2 heard, RRR. No JVD, murmurs, rubs, gallops or clicks. No pedal edema. Gastrointestinal system: Abdomen is nondistended, soft and nontender. No organomegaly or masses felt. Normal bowel sounds heard. Central nervous system: Alert and oriented. Decreased strength in LE bilaterally 3/5.  Skin: No rashes, lesions or ulcers Psychiatry: Mood & affect appropriate.     Data Reviewed: I have personally reviewed following labs and imaging studies  CBC: Recent Labs  Lab 12/31/16 0434 01/02/17 1544 01/02/17 1617 01/03/17 1356 01/05/17 0640 01/05/17 0912  WBC 8.2  --  9.6  --  8.7  --   NEUTROABS  --   --  6.7  --   --   --   HGB 15.7 11.9* 16.9 17.3* 15.8 14.6  HCT 45.4 35.0*  47.0 51.0 44.8 43.0  MCV 87.5  --  86.6  --  87.5  --   PLT 169  --  182  --  107*  --    Basic Metabolic Panel: Recent Labs  Lab 12/31/16 0434 01/02/17 1544 01/03/17 1356 01/05/17 0640 01/05/17 0912  NA 138 143 135 140 141  K 3.8 2.7* 4.6 4.0 3.9  CL 106 108 98* 106 100*  CO2 26  --    --  28  --   GLUCOSE 141* 161* 342* 118* 115*  BUN 15 14 34* 17 20  CREATININE 0.82 0.30* 0.90 0.73 0.70  CALCIUM 8.9  --   --  9.0  --   MG 2.0  --   --   --   --   PHOS 3.6  --   --   --   --    GFR: Estimated Creatinine Clearance: 115.8 mL/min (by C-G formula based on SCr of 0.7 mg/dL). Liver Function Tests: Recent Labs  Lab 12/31/16 0434 01/05/17 0640  AST 26 21  ALT 27 17  ALKPHOS 152* 85  BILITOT 0.7 0.7  PROT 6.6 5.4*  ALBUMIN 3.5 4.0   No results for input(s): LIPASE, AMYLASE in the last 168 hours. No results for input(s): AMMONIA in the last 168 hours. Coagulation Profile: Recent Labs  Lab 01/05/17 1351  INR 1.96   Cardiac Enzymes: Recent Labs  Lab 01/05/17 1351  CKTOTAL 55   BNP (last 3 results) No results for input(s): PROBNP in the last 8760 hours. HbA1C: No results for input(s): HGBA1C in the last 72 hours. CBG: Recent Labs  Lab 01/05/17 0742 01/05/17 1310 01/05/17 1628 01/05/17 2111 01/06/17 0501  GLUCAP 154* 150* 98 107* 103*   Lipid Profile: No results for input(s): CHOL, HDL, LDLCALC, TRIG, CHOLHDL, LDLDIRECT in the last 72 hours. Thyroid Function Tests: No results for input(s): TSH, T4TOTAL, FREET4, T3FREE, THYROIDAB in the last 72 hours. Anemia Panel: No results for input(s): VITAMINB12, FOLATE, FERRITIN, TIBC, IRON, RETICCTPCT in the last 72 hours. Sepsis Labs: No results for input(s): PROCALCITON, LATICACIDVEN in the last 168 hours.  Recent Results (from the past 240 hour(s))  MRSA PCR Screening     Status: None   Collection Time: 12/30/16  2:19 PM  Result Value Ref Range Status   MRSA by PCR NEGATIVE NEGATIVE Final    Comment:        The GeneXpert MRSA Assay (FDA approved for NASAL specimens only), is one component of a comprehensive MRSA colonization surveillance program. It is not intended to diagnose MRSA infection nor to guide or monitor treatment for MRSA infections.          Radiology Studies: No results  found.      Scheduled Meds: . busPIRone  30 mg Oral QHS  . finasteride  5 mg Oral Daily  . insulin pump   Subcutaneous TID AC, HS, 0200  . lisinopril  2.5 mg Oral Daily  . multivitamin with minerals  1 tablet Oral Daily  . polyethylene glycol  17 g Oral BID  . vitamin B-12  1,000 mcg Oral Daily  . vitamin E  100 Units Oral Daily   Continuous Infusions:    LOS: 7 days      Kayleen Memos, MD [facility]  If 7PM-7AM, please contact night-coverage [email or web address] Password [email or web address] 01/06/2017, 7:12 AM

## 2017-01-06 NOTE — Progress Notes (Signed)
Advanced Directive Completion with volunteers.  Copy placed on patient's chart and original copy given to patient with requested copies.  Thank you for the privilege of serving this patient.    01/06/17 1428  Clinical Encounter Type  Visited With Patient  Visit Type Follow-up;Spiritual support  Advance Directives (For Healthcare)  Does Patient Have a Medical Advance Directive? Yes

## 2017-01-07 LAB — BASIC METABOLIC PANEL
Anion gap: 5 (ref 5–15)
BUN: 15 mg/dL (ref 6–20)
CALCIUM: 8.4 mg/dL — AB (ref 8.9–10.3)
CO2: 26 mmol/L (ref 22–32)
CREATININE: 0.76 mg/dL (ref 0.61–1.24)
Chloride: 105 mmol/L (ref 101–111)
GFR calc Af Amer: >60 mL/min (ref >60)
Glucose, Bld: 291 mg/dL — ABNORMAL HIGH (ref 65–99)
Potassium: 3.8 mmol/L (ref 3.5–5.1)
SODIUM: 136 mmol/L (ref 135–145)

## 2017-01-07 LAB — CBC
HCT: 42.8 % (ref 39.0–52.0)
Hemoglobin: 15.1 g/dL (ref 13.0–17.0)
MCH: 30.8 pg (ref 26.0–34.0)
MCHC: 35.3 g/dL (ref 30.0–36.0)
MCV: 87.3 fL (ref 78.0–100.0)
PLATELETS: 112 10*3/uL — AB (ref 150–400)
RBC: 4.9 MIL/uL (ref 4.22–5.81)
RDW: 13.3 % (ref 11.5–15.5)
WBC: 8.6 10*3/uL (ref 4.0–10.5)

## 2017-01-07 LAB — GLUCOSE, CAPILLARY
GLUCOSE-CAPILLARY: 205 mg/dL — AB (ref 65–99)
GLUCOSE-CAPILLARY: 210 mg/dL — AB (ref 65–99)
Glucose-Capillary: 166 mg/dL — ABNORMAL HIGH (ref 65–99)

## 2017-01-07 MED ORDER — CALCIUM CARBONATE ANTACID 500 MG PO CHEW
CHEWABLE_TABLET | ORAL | Status: AC
  Filled 2017-01-07: qty 2

## 2017-01-07 MED ORDER — SODIUM CHLORIDE 0.9 % IV SOLN
INTRAVENOUS | Status: DC
  Filled 2017-01-07 (×3): qty 200

## 2017-01-07 MED ORDER — ACD FORMULA A 0.73-2.45-2.2 GM/100ML VI SOLN
500.0000 mL | Status: DC
Start: 2017-01-07 — End: 2017-01-10
  Administered 2017-01-07 – 2017-01-09 (×3): 500 mL via INTRAVENOUS
  Filled 2017-01-07 (×2): qty 500

## 2017-01-07 MED ORDER — SODIUM CHLORIDE 0.9 % IV SOLN
2.0000 g | Freq: Once | INTRAVENOUS | Status: AC
  Administered 2017-01-07: 2 g via INTRAVENOUS
  Filled 2017-01-07: qty 20

## 2017-01-07 MED ORDER — SODIUM CHLORIDE 0.9 % IV SOLN
INTRAVENOUS | Status: AC
  Administered 2017-01-07 (×4): via INTRAVENOUS_CENTRAL
  Filled 2017-01-07 (×4): qty 200

## 2017-01-07 MED ORDER — ACD FORMULA A 0.73-2.45-2.2 GM/100ML VI SOLN
Status: AC
  Filled 2017-01-07: qty 1000

## 2017-01-07 MED ORDER — ACETAMINOPHEN 325 MG PO TABS: 650.0000 mg | ORAL_TABLET | ORAL | Status: DC | PRN

## 2017-01-07 MED ORDER — DIPHENHYDRAMINE HCL 25 MG PO CAPS: 25.0000 mg | ORAL_CAPSULE | Freq: Four times a day (QID) | ORAL | Status: DC | PRN

## 2017-01-07 MED ORDER — CALCIUM CARBONATE ANTACID 500 MG PO CHEW
2.0000 | CHEWABLE_TABLET | ORAL | Status: AC
  Administered 2017-01-07 (×2): 400 mg via ORAL

## 2017-01-07 MED ORDER — HEPARIN SODIUM (PORCINE) 1000 UNIT/ML IJ SOLN: 1000.0000 [IU] | Freq: Once | INTRAMUSCULAR | Status: DC

## 2017-01-07 NOTE — Progress Notes (Signed)
RT went to place pt on CPAP pt has home CPAP at bedside.  CPAP with in reach and pt places on self

## 2017-01-07 NOTE — Progress Notes (Signed)
Pt performed NIF and VC with good effort.   NIF -40 VC 3.9L

## 2017-01-07 NOTE — Progress Notes (Signed)
PROGRESS NOTE    Kenneth Pace  BJS:[id number] DOB: [birth year] DOA: 12/29/2016 PCP: Renato Shin, MD  Brief [phone] y.o.malewithmedicalhistorysignificant for but not limited tosubacute inflammatory polyradiculoneuropathy, DM on insulin pump,presenting with progressive weakness, numbness and hyperreflexia and history of demyelinating neuropathy on EMG,and felt to have myeloneuropathy. 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 Patient has had MRI which indicated some element of cervical myelopathy due to his constellation ofsigns/symptoms.  Patient was seen and examined at his bedside. He is very optimistic about possible inpatient rehab and has been workingdiligently with PT. Lots of improvement in his strength. Has no complaints this morning.   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 Disposition Plan:  Plan is for him to be discharged to inpatient  rehab once done with plasmapheresis.   Consultants neurology, neurosurgery, physical medicine and rehab  Procedures: None Antimicrobials: None   Objective: Vitals:   01/07/17 1130 01/07/17 1145 01/07/17 1200 01/07/17 1215  BP: 124/69 118/67 121/65 140/61  Pulse: 84 78 83 84  Resp: 20 18 15 14   Temp:  98.4 F (36.9 C)  98.5 F (36.9 C)  TempSrc:  Oral  Oral  SpO2:      Weight:      Height:        Intake/Output Summary (Last 24 hours) at 01/07/2017 1226 Last data filed at 01/07/2017 0935 Gross per 24 hour  Intake 922 ml  Output 1100 ml  Net -178 ml   Filed Weights   01/06/17 0457 01/07/17 0212 01/07/17 1100  Weight: 103.2 kg (227 lb 8 oz) 104.6 kg (230 lb 9.6 oz) 104.6 kg (230 lb 9.6 oz)    Examination:  General exam: Pt in nad, alert and awake Respiratory system: Clear to auscultation. Respiratory effort normal. Equal chest rise. Cardiovascular system: S1 & S2 heard, RRR. No JVD, murmurs, rubs, gallops or clicks. No pedal edema. Gastrointestinal system: Abdomen is nondistended, soft and nontender. No organomegaly or masses felt. Normal bowel sounds heard. Central nervous system: Alert and oriented. Decreased strength in LE bilaterally 3/5.  Skin: No rashes, lesions or ulcers Psychiatry: Mood & affect appropriate.     Data Reviewed: I have personally reviewed following labs and imaging studies  CBC: Recent Labs  Lab 01/02/17 1617 01/03/17 1356 01/05/17 0640 01/05/17 0912 01/07/17 0445  WBC 9.6  --  8.7  --  8.6  NEUTROABS 6.7  --   --   --   --   HGB 16.9 17.3* 15.8 14.6 15.1  HCT 47.0 51.0 44.8 43.0 42.8  MCV 86.6  --  87.5  --  87.3  PLT  182  --  107*  --  081*   Basic Metabolic Panel: Recent Labs  Lab 01/02/17 1544 01/03/17 1356 01/05/17 0640 01/05/17 0912 01/07/17 0445  NA 143 135 140 141 136  K 2.7* 4.6 4.0 3.9 3.8  CL 108 98* 106 100* 105  CO2  --   --  28  --  26  GLUCOSE 161* 342* 118* 115* 291*  BUN 14 34* 17 20 15   CREATININE 0.30*  0.90 0.73 0.70 0.76  CALCIUM  --   --  9.0  --  8.4*   GFR: Estimated Creatinine Clearance: 116.6 mL/min (by C-G formula based on SCr of 0.76 mg/dL). Liver Function Tests: Recent Labs  Lab 01/05/17 0640  AST 21  ALT 17  ALKPHOS 85  BILITOT 0.7  PROT 5.4*  ALBUMIN 4.0   No results for input(s): LIPASE, AMYLASE in the last 168 hours. No results for input(s): AMMONIA in the last 168 hours. Coagulation Profile: Recent Labs  Lab 01/05/17 1351  INR 1.96   Cardiac Enzymes: Recent Labs  Lab 01/05/17 1351  CKTOTAL 55   BNP (last 3 results) No results for input(s): PROBNP in the last 8760 hours. HbA1C: No results for input(s): HGBA1C in the last 72 hours. CBG: Recent Labs  Lab 01/06/17 0735 01/06/17 1247 01/06/17 1557 01/06/17 2047 01/07/17 0845  GLUCAP 94 192* 76 109* 205*   Lipid Profile: No results for input(s): CHOL, HDL, LDLCALC, TRIG, CHOLHDL, LDLDIRECT in the last 72 hours. Thyroid Function Tests: No results for input(s): TSH, T4TOTAL, FREET4, T3FREE, THYROIDAB in the last 72 hours. Anemia Panel: No results for input(s): VITAMINB12, FOLATE, FERRITIN, TIBC, IRON, RETICCTPCT in the last 72 hours. Sepsis Labs: No results for input(s): PROCALCITON, LATICACIDVEN in the last 168 hours.  Recent Results (from the past 240 hour(s))  MRSA PCR Screening     Status: None   Collection Time: 12/30/16  2:19 PM  Result Value Ref Range Status   MRSA by PCR NEGATIVE NEGATIVE Final    Comment:        The GeneXpert MRSA Assay (FDA approved for NASAL specimens only), is one component of a comprehensive MRSA colonization surveillance program. It is not intended to diagnose MRSA infection nor to guide or monitor treatment for MRSA infections.      Radiology Studies: No results found.  Scheduled Meds: . busPIRone  30 mg Oral QHS  . calcium carbonate  2 tablet Oral Q3H  . finasteride  5 mg Oral Daily  . heparin  1,000 Units Intracatheter Once  . insulin pump    Subcutaneous TID AC, HS, 0200  . lisinopril  2.5 mg Oral Daily  . multivitamin with minerals  1 tablet Oral Daily  . polyethylene glycol  17 g Oral BID  . vitamin B-12  1,000 mcg Oral Daily  . vitamin E  100 Units Oral Daily   Continuous Infusions: . therapeutic plasma exchange solution 1,000 mL/hr at 01/07/17 1220  . calcium gluconate IVPB 2 g (01/07/17 1114)  . citrate dextrose    . citrate dextrose       LOS: 8 days    Velvet Bathe, MD [facility]  If 7PM-7AM, please contact night-coverage [email or web address] Password [facility] 01/07/2017, 12:26 PM

## 2017-01-07 NOTE — Progress Notes (Signed)
VC 3.9L  NIF > -60

## 2017-01-07 NOTE — Progress Notes (Signed)
PT has home CPAP @ bedside.  Pt able to manage machine.

## 2017-01-08 LAB — GLUCOSE, CAPILLARY
GLUCOSE-CAPILLARY: 209 mg/dL — AB (ref 65–99)
Glucose-Capillary: 71 mg/dL (ref 65–99)
Glucose-Capillary: 163 mg/dL — ABNORMAL HIGH (ref 65–99)
Glucose-Capillary: 148 mg/dL — ABNORMAL HIGH (ref 65–99)

## 2017-01-08 LAB — POCT I-STAT, CHEM 8
BUN: 15 mg/dL (ref 6–20)
CALCIUM ION: 1.2 mmol/L (ref 1.15–1.40)
Chloride: 101 mmol/L (ref 101–111)
Creatinine, Ser: 0.7 mg/dL (ref 0.61–1.24)
GLUCOSE: 240 mg/dL — AB (ref 65–99)
HCT: 43 % (ref 39.0–52.0)
HEMOGLOBIN: 14.6 g/dL (ref 13.0–17.0)
Potassium: 4.1 mmol/L (ref 3.5–5.1)
Sodium: 139 mmol/L (ref 135–145)
TCO2: 26 mmol/L (ref 22–32)

## 2017-01-08 NOTE — Progress Notes (Addendum)
PROGRESS NOTE    Kenneth Pace  SLH:[id number] DOB: 1950/09/14 DOA: 12/29/2016 PCP: Renato Shin, MD  Brief [phone] y.o.malewithmedicalhistorysignificant for but not limited tosubacute inflammatory polyradiculoneuropathy, DM on insulin pump,presenting with progressive weakness, numbness and hyperreflexia and history of demyelinating neuropathy on EMG,and felt to have myeloneuropathy. 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 Patient has had MRI which indicated some element of cervical myelopathy due to his constellation ofsigns/symptoms.  Patient was seen and examined at his bedside. He is very optimistic about possible inpatient rehab and has been workingdiligently with PT. Lots of improvement in his strength. Has no complaints this morning.   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 Currently stable will transfer to medical floor - Plan is for inpatient rehab services. If in the near future not improvement neurology recommends neurosurgery consult.   Type 2 diabetes on insulin pump    -Blood sugars stable -A1C 7.3 (11/20/16)   Essential HTN -Stable, lisinopril  BPH -Proscar  Dyslipidemia -Zetia and Crestor held until ore stable  depression  -Stable on BuSpar  DVT prophylaxis: SCD Code Status:  FULL Family Communication: directly with patient. Disposition Plan:  Plan is for him to be discharged to inpatient rehab once done with plasmapheresis.   Consultants neurology, neurosurgery, physical medicine and rehab  Procedures: None Antimicrobials: None   Objective: Vitals:   01/08/17 0024 01/08/17 0449 01/08/17 0756 01/08/17 1149  BP: 131/70 128/60 133/72 136/71  Pulse: 72 80  86  Resp: 15 15 18 18   Temp: (!) 97.5 F (36.4 C) 97.8 F (36.6 C) 98 F (36.7 C) 97.9 F (36.6 C)  TempSrc: Oral Oral Oral Oral  SpO2: 100% 100% 100% 100%  Weight:  103.8 kg (228 lb 14.4 oz)    Height:        Intake/Output Summary (Last 24 hours) at 01/08/2017 1307 Last data filed at 01/07/2017 2100 Gross per 24 hour  Intake 240 ml  Output 601 ml  Net -361 ml   Filed Weights   01/07/17 0212 01/07/17 1100 01/08/17 0449  Weight: 104.6 kg (230 lb 9.6 oz) 104.6 kg (230 lb 9.6 oz) 103.8 kg (228 lb 14.4 oz)    Examination:  General exam: Pt in nad, alert and awake Respiratory system: Clear to auscultation. Respiratory effort normal. Equal chest rise. Cardiovascular system: S1 & S2 heard, RRR. No JVD, murmurs, rubs, gallops or clicks. No pedal edema. Gastrointestinal system: Abdomen is nondistended, soft and nontender. No organomegaly or masses felt. Normal bowel sounds heard. Central nervous system: Alert and oriented. Decreased strength in LE bilaterally 3/5.  Skin: No rashes, lesions or ulcers Psychiatry: Mood & affect appropriate.   Data Reviewed: I have personally reviewed following labs and imaging studies  CBC: Recent Labs  Lab 01/02/17 1617 01/03/17 1356 01/05/17 0640 01/05/17 0912 01/07/17 0445 01/07/17 1105  WBC 9.6  --  8.7  --  8.6  --   NEUTROABS 6.7  --   --   --   --   --  HGB 16.9 17.3* 15.8 14.6 15.1 14.6  HCT 47.0 51.0 44.8 43.0 42.8 43.0  MCV 86.6  --  87.5  --  87.3  --   PLT 182  --  107*  --  112*  --    Basic Metabolic Panel: Recent Labs  Lab  01/03/17 1356 01/05/17 0640 01/05/17 0912 01/07/17 0445 01/07/17 1105  NA 135 140 141 136 139  K 4.6 4.0 3.9 3.8 4.1  CL 98* 106 100* 105 101  CO2  --  28  --  26  --   GLUCOSE 342* 118* 115* 291* 240*  BUN 34* 17 20 15 15   CREATININE 0.90 0.73 0.70 0.76 0.70  CALCIUM  --  9.0  --  8.4*  --    GFR: Estimated Creatinine Clearance: 116.2 mL/min (by C-G formula based on SCr of 0.7 mg/dL). Liver Function Tests: Recent Labs  Lab 01/05/17 0640  AST 21  ALT 17  ALKPHOS 85  BILITOT 0.7  PROT 5.4*  ALBUMIN 4.0   No results for input(s): LIPASE, AMYLASE in the last 168 hours. No results for input(s): AMMONIA in the last 168 hours. Coagulation Profile: Recent Labs  Lab 01/05/17 1351  INR 1.96   Cardiac Enzymes: Recent Labs  Lab 01/05/17 1351  CKTOTAL 55   BNP (last 3 results) No results for input(s): PROBNP in the last 8760 hours. HbA1C: No results for input(s): HGBA1C in the last 72 hours. CBG: Recent Labs  Lab 01/07/17 0845 01/07/17 1541 01/07/17 2041 01/08/17 0653 01/08/17 1126  GLUCAP 205* 210* 166* 71 209*   Lipid Profile: No results for input(s): CHOL, HDL, LDLCALC, TRIG, CHOLHDL, LDLDIRECT in the last 72 hours. Thyroid Function Tests: No results for input(s): TSH, T4TOTAL, FREET4, T3FREE, THYROIDAB in the last 72 hours. Anemia Panel: No results for input(s): VITAMINB12, FOLATE, FERRITIN, TIBC, IRON, RETICCTPCT in the last 72 hours. Sepsis Labs: No results for input(s): PROCALCITON, LATICACIDVEN in the last 168 hours.  Recent Results (from the past 240 hour(s))  MRSA PCR Screening     Status: None   Collection Time: 12/30/16  2:19 PM  Result Value Ref Range Status   MRSA by PCR NEGATIVE NEGATIVE Final    Comment:        The GeneXpert MRSA Assay (FDA approved for NASAL specimens only), is one component of a comprehensive MRSA colonization surveillance program. It is not intended to diagnose MRSA infection nor to guide or monitor treatment  for MRSA infections.      Radiology Studies: No results found.  Scheduled Meds: . busPIRone  30 mg Oral QHS  . finasteride  5 mg Oral Daily  . heparin  1,000 Units Intracatheter Once  . insulin pump   Subcutaneous TID AC, HS, 0200  . lisinopril  2.5 mg Oral Daily  . multivitamin with minerals  1 tablet Oral Daily  . polyethylene glycol  17 g Oral BID  . vitamin B-12  1,000 mcg Oral Daily  . vitamin E  100 Units Oral Daily   Continuous Infusions: . citrate dextrose       LOS: 9 days    Velvet Bathe, MD [facility]  If 7PM-7AM, please contact night-coverage [email or web address] Password [email or web address] 01/08/2017, 1:07 PM

## 2017-01-08 NOTE — Progress Notes (Signed)
-  40 NIF and VC 3.1L with good effort from pt.

## 2017-01-08 NOTE — Progress Notes (Signed)
NIF: -45 FVC: 3.2L   Good effort from patient.

## 2017-01-08 NOTE — Progress Notes (Signed)
PROGRESS NOTE    Kenneth Pace  AJO:[id number] DOB: [birth year] DOA: 12/29/2016 PCP: Renato Shin, MD  Brief [phone] y.o.malewithmedicalhistorysignificant for but not limited tosubacute inflammatory polyradiculoneuropathy, DM on insulin pump,presenting with progressive weakness, numbness and hyperreflexia and history of demyelinating neuropathy on EMG,and felt to have myeloneuropathy. 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 Patient has had MRI which indicated some element of cervical myelopathy due to his constellation ofsigns/symptoms.  Patient was seen and examined at his bedside. He is very optimistic about possible inpatient rehab and has been workingdiligently with PT. Lots of improvement in his strength. Has no complaints this morning.   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 - continue current regimen.  Type 2 diabetes on insulin pump    -Blood sugars stable -A1C 7.3 (11/20/16)   Essential HTN -Stable, lisinopril  BPH -Proscar  Dyslipidemia -Zetia and Crestor held until ore stable  depression  -Stable on BuSpar  DVT prophylaxis: SCD Code Status:FULL Family Communication: directly with patient. Disposition Plan:  Plan is for him  to be discharged to inpatient rehab once done with plasmapheresis.   Consultants neurology, neurosurgery, physical medicine and rehab  Procedures: None Antimicrobials: None   Objective: Vitals:   01/08/17 0024 01/08/17 0449 01/08/17 0756 01/08/17 1149  BP: 131/70 128/60 133/72 136/71  Pulse: 72 80  86  Resp: 15 15 18 18   Temp: (!) 97.5 F (36.4 C) 97.8 F (36.6 C) 98 F (36.7 C) 97.9 F (36.6 C)  TempSrc: Oral Oral Oral Oral  SpO2: 100% 100% 100% 100%  Weight:  103.8 kg (228 lb 14.4 oz)    Height:        Intake/Output Summary (Last 24 hours) at 01/08/2017 1437 Last data filed at 01/07/2017 2100 Gross per 24 hour  Intake 240 ml  Output 600 ml  Net -360 ml   Filed Weights   01/07/17 0212 01/07/17 1100 01/08/17 0449  Weight: 104.6 kg (230 lb 9.6 oz) 104.6 kg (230 lb 9.6 oz) 103.8 kg (228 lb 14.4 oz)    Examination: Exam unchanged from yesterday  General exam: Pt in nad, alert and awake Respiratory system: Clear to auscultation. Respiratory effort normal. Equal chest rise. Cardiovascular system: S1 & S2 heard, RRR. No JVD, murmurs, rubs, gallops or clicks. No pedal edema. Gastrointestinal system: Abdomen is nondistended, soft and nontender. No organomegaly or masses felt. Normal bowel sounds heard. Central nervous system: Alert and oriented. Decreased strength in LE bilaterally 3/5.  Skin: No rashes, lesions or ulcers Psychiatry: Mood & affect appropriate.     Data Reviewed: I have personally reviewed following labs and imaging studies  CBC: Recent Labs  Lab 01/02/17 1617 01/03/17 1356 01/05/17 0640 01/05/17 0912 01/07/17 0445 01/07/17 1105  WBC 9.6  --  8.7  --  8.6  --   NEUTROABS 6.7  --   --   --   --   --  HGB 16.9 17.3* 15.8 14.6 15.1 14.6  HCT 47.0 51.0 44.8 43.0 42.8 43.0  MCV 86.6  --  87.5  --  87.3  --   PLT 182  --  107*  --  112*  --    Basic Metabolic Panel: Recent Labs  Lab 01/03/17 1356 01/05/17 0640 01/05/17 0912 01/07/17 0445  01/07/17 1105  NA 135 140 141 136 139  K 4.6 4.0 3.9 3.8 4.1  CL 98* 106 100* 105 101  CO2  --  28  --  26  --   GLUCOSE 342* 118* 115* 291* 240*  BUN 34* 17 20 15 15   CREATININE 0.90 0.73 0.70 0.76 0.70  CALCIUM  --  9.0  --  8.4*  --    GFR: Estimated Creatinine Clearance: 116.2 mL/min (by C-G formula based on SCr of 0.7 mg/dL). Liver Function Tests: Recent Labs  Lab 01/05/17 0640  AST 21  ALT 17  ALKPHOS 85  BILITOT 0.7  PROT 5.4*  ALBUMIN 4.0   No results for input(s): LIPASE, AMYLASE in the last 168 hours. No results for input(s): AMMONIA in the last 168 hours. Coagulation Profile: Recent Labs  Lab 01/05/17 1351  INR 1.96   Cardiac Enzymes: Recent Labs  Lab 01/05/17 1351  CKTOTAL 55   BNP (last 3 results) No results for input(s): PROBNP in the last 8760 hours. HbA1C: No results for input(s): HGBA1C in the last 72 hours. CBG: Recent Labs  Lab 01/07/17 0845 01/07/17 1541 01/07/17 2041 01/08/17 0653 01/08/17 1126  GLUCAP 205* 210* 166* 71 209*   Lipid Profile: No results for input(s): CHOL, HDL, LDLCALC, TRIG, CHOLHDL, LDLDIRECT in the last 72 hours. Thyroid Function Tests: No results for input(s): TSH, T4TOTAL, FREET4, T3FREE, THYROIDAB in the last 72 hours. Anemia Panel: No results for input(s): VITAMINB12, FOLATE, FERRITIN, TIBC, IRON, RETICCTPCT in the last 72 hours. Sepsis Labs: No results for input(s): PROCALCITON, LATICACIDVEN in the last 168 hours.  Recent Results (from the past 240 hour(s))  MRSA PCR Screening     Status: None   Collection Time: 12/30/16  2:19 PM  Result Value Ref Range Status   MRSA by PCR NEGATIVE NEGATIVE Final    Comment:        The GeneXpert MRSA Assay (FDA approved for NASAL specimens only), is one component of a comprehensive MRSA colonization surveillance program. It is not intended to diagnose MRSA infection nor to guide or monitor treatment for MRSA infections.      Radiology Studies: No results  found.  Scheduled Meds: . busPIRone  30 mg Oral QHS  . finasteride  5 mg Oral Daily  . heparin  1,000 Units Intracatheter Once  . insulin pump   Subcutaneous TID AC, HS, 0200  . lisinopril  2.5 mg Oral Daily  . multivitamin with minerals  1 tablet Oral Daily  . polyethylene glycol  17 g Oral BID  . vitamin B-12  1,000 mcg Oral Daily  . vitamin E  100 Units Oral Daily   Continuous Infusions: . citrate dextrose       LOS: 9 days    Velvet Bathe, MD [facility]  If 7PM-7AM, please contact night-coverage [email or web address] Password [facility] 01/08/2017, 2:37 PM

## 2017-01-08 NOTE — Progress Notes (Signed)
Patient has CPAP on bedside table. CPAP is set up and ready to use with patient's home mask. Patient stated he will put on himself when he is ready for bed. RT will continue to monitor as needed.

## 2017-01-09 LAB — CBC WITH DIFFERENTIAL/PLATELET
Basophils Absolute: 0 10*3/uL (ref 0.0–0.1)
Basophils Relative: 0 %
EOS PCT: 3 %
Eosinophils Absolute: 0.2 10*3/uL (ref 0.0–0.7)
HEMATOCRIT: 42.9 % (ref 39.0–52.0)
Hemoglobin: 15 g/dL (ref 13.0–17.0)
LYMPHS ABS: 1.3 10*3/uL (ref 0.7–4.0)
LYMPHS PCT: 16 %
MCH: 30.9 pg (ref 26.0–34.0)
MCHC: 35 g/dL (ref 30.0–36.0)
MCV: 88.3 fL (ref 78.0–100.0)
MONO ABS: 0.5 10*3/uL (ref 0.1–1.0)
Monocytes Relative: 6 %
Neutro Abs: 6.2 10*3/uL (ref 1.7–7.7)
Neutrophils Relative %: 75 %
PLATELETS: 152 10*3/uL (ref 150–400)
RBC: 4.86 MIL/uL (ref 4.22–5.81)
RDW: 13.4 % (ref 11.5–15.5)
WBC: 8.3 10*3/uL (ref 4.0–10.5)

## 2017-01-09 LAB — GLUCOSE, CAPILLARY
GLUCOSE-CAPILLARY: 192 mg/dL — AB (ref 65–99)
GLUCOSE-CAPILLARY: 159 mg/dL — AB (ref 65–99)
Glucose-Capillary: 84 mg/dL (ref 65–99)
Glucose-Capillary: 107 mg/dL — ABNORMAL HIGH (ref 65–99)

## 2017-01-09 LAB — RENAL FUNCTION PANEL
Albumin: 3.9 g/dL (ref 3.5–5.0)
Anion gap: 6 (ref 5–15)
BUN: 12 mg/dL (ref 6–20)
CHLORIDE: 104 mmol/L (ref 101–111)
CO2: 26 mmol/L (ref 22–32)
CREATININE: 0.69 mg/dL (ref 0.61–1.24)
Calcium: 8.7 mg/dL — ABNORMAL LOW (ref 8.9–10.3)
GFR calc non Af Amer: >60 mL/min (ref >60)
Glucose, Bld: 127 mg/dL — ABNORMAL HIGH (ref 65–99)
POTASSIUM: 3.9 mmol/L (ref 3.5–5.1)
Phosphorus: 3.4 mg/dL (ref 2.5–4.6)
Sodium: 136 mmol/L (ref 135–145)

## 2017-01-09 MED ORDER — ACETAMINOPHEN 325 MG PO TABS: 650.0000 mg | ORAL_TABLET | ORAL | Status: DC | PRN

## 2017-01-09 MED ORDER — ACD FORMULA A 0.73-2.45-2.2 GM/100ML VI SOLN
Status: AC
  Filled 2017-01-09: qty 500

## 2017-01-09 MED ORDER — HEPARIN SODIUM (PORCINE) 1000 UNIT/ML IJ SOLN: 1000.0000 [IU] | Freq: Once | INTRAMUSCULAR | Status: DC

## 2017-01-09 MED ORDER — DIPHENHYDRAMINE HCL 25 MG PO CAPS: 25.0000 mg | ORAL_CAPSULE | Freq: Four times a day (QID) | ORAL | Status: DC | PRN

## 2017-01-09 MED ORDER — ACD FORMULA A 0.73-2.45-2.2 GM/100ML VI SOLN
500.0000 mL | Status: DC
  Filled 2017-01-09: qty 500

## 2017-01-09 MED ORDER — CALCIUM CARBONATE ANTACID 500 MG PO CHEW
CHEWABLE_TABLET | ORAL | Status: AC
  Administered 2017-01-09: 400 mg
  Filled 2017-01-09: qty 2

## 2017-01-09 MED ORDER — SODIUM CHLORIDE 0.9 % IV SOLN
INTRAVENOUS | Status: AC
  Administered 2017-01-09 (×4): via INTRAVENOUS_CENTRAL
  Filled 2017-01-09 (×4): qty 200

## 2017-01-09 MED ORDER — CALCIUM CARBONATE ANTACID 500 MG PO CHEW
2.0000 | CHEWABLE_TABLET | ORAL | Status: AC
  Filled 2017-01-09: qty 2

## 2017-01-09 MED ORDER — SODIUM CHLORIDE 0.9 % IV SOLN
2.0000 g | Freq: Once | INTRAVENOUS | Status: AC
  Administered 2017-01-09: 2 g via INTRAVENOUS
  Filled 2017-01-09: qty 20

## 2017-01-09 NOTE — Progress Notes (Signed)
Occupational Therapy Treatment Patient Details Name: Kenneth Pace MRN: [id number] DOB: [birth year] Today's Date: 01/09/2017    History of present illness Pt is a 66 y/o male admitted secondary to increased weakness in UEs and LEs. Pt previously admitted earlier in October 2018 secondary to suspected GB. Has had rapid decline since working with PT and OT. MRI of thoracic spine revealed disk protrusions with secondary cord flattening at T5-T10. CT negative for acute findings. MRI 11/15 shows multilevel moderate cervical spinal canal stenosis, worst at C3-4 and C5-6 with associated mild cord deformity but no cord signal change. Pt undergoing plasmapharesis.    OT comments  Pt demonstrating progress toward OT goals. However, he does report increased numbness in ulnar distribution of B hands this session. Facilitated improved dynamic sitting balance and trunk control with functional reaching tasks as a precursor to ADL and pt requiring min assist to correct. Pt was able to stand at sink with min assist to complete oral care tasks and demonstrated continued difficulty coordinating both reach and grasp as well as reach and release movements. Continue to note claw deformity with increasing fatigue in R hand and may need to move forward with splinting as this has become increasingly consistent. Continue to feel that pt is an excellent CIR candidate. Will continue to follow while admitted.    Follow Up Recommendations  CIR;Supervision/Assistance - 24 hour    Equipment Recommendations  None recommended by OT    Recommendations for Other Services Rehab consult    Precautions / Restrictions Precautions Precautions: Fall Restrictions Weight Bearing Restrictions: No       Mobility Bed Mobility Overal bed mobility: Needs Assistance Bed Mobility: Rolling;Sidelying to Sit Rolling: Min guard Sidelying to sit: Min assist       General bed mobility comments: Facilitated core activation prior to  reaching with UE.   Transfers Overall transfer level: Needs assistance Equipment used: Rolling walker (2 wheeled) Transfers: Sit to/from Stand Sit to Stand: Min assist         General transfer comment: Min assist to power up with RW. Uses B UE for compensation.     Balance Overall balance assessment: Needs assistance Sitting-balance support: Feet unsupported;No upper extremity supported Sitting balance-Leahy Scale: Fair Sitting balance - Comments: Requires assist for dynamic balance.    Standing balance support: Bilateral upper extremity supported Standing balance-Leahy Scale: Poor Standing balance comment: Relies on external assist to stand statically.                            ADL either performed or assessed with clinical judgement   ADL Overall ADL's : Needs assistance/impaired     Grooming: Minimal assistance;Wash/dry hands;Wash/dry face;Oral care;Standing Grooming Details (indicate cue type and reason): difficulty opening toothpaste and to squeeze toothpaste container                 Toilet Transfer: Minimal assistance;Ambulation Toilet Transfer Details (indicate cue type and reason): Noted compensatory movements to achieve stability on his feet.          Functional mobility during ADLs: Minimal assistance;Rolling walker General ADL Comments: Continue to note intrinsic minus positioning in R hand especially with fatigue this session.      Vision   Vision Assessment?: No apparent visual deficits   Perception     Praxis      Cognition Arousal/Alertness: Awake/alert Behavior During Therapy: WFL for tasks assessed/performed Overall Cognitive Status: Within Functional Limits for tasks assessed  General Comments: Stuttering is pt's baseline        Exercises Exercises: Other exercises Other Exercises Other Exercises: Facilitated improved dynamic seated balance with reaching tasks in PNF  patterns. Pt with increased effort for combination of reach and grasp tasks. Required min assist to correct balance when leaning out of base of support.    Shoulder Instructions       General Comments      Pertinent Vitals/ Pain       Pain Assessment: Faces Faces Pain Scale: Hurts even more Pain Location: Lower back with spasm  Pain Descriptors / Indicators: Spasm;Grimacing Pain Intervention(s): Monitored during session  Home Living                                          Prior Functioning/Environment              Frequency  Min 2X/week        Progress Toward Goals  OT Goals(current goals can now be found in the care plan section)  Progress towards OT goals: Progressing toward goals  Acute Rehab OT Goals Patient Stated Goal: to get better  OT Goal Formulation: With patient Time For Goal Achievement: 01/13/17 Potential to Achieve Goals: Good  Plan Discharge plan remains appropriate    Co-evaluation                 AM-PAC PT "6 Clicks" Daily Activity     Outcome Measure   Help from another person eating meals?: A Little Help from another person taking care of personal grooming?: A Little Help from another person toileting, which includes using toliet, bedpan, or urinal?: A Lot Help from another person bathing (including washing, rinsing, drying)?: A Lot Help from another person to put on and taking off regular upper body clothing?: A Lot Help from another person to put on and taking off regular lower body clothing?: A Lot 6 Click Score: 14    End of Session Equipment Utilized During Treatment: Gait belt;Rolling walker  OT Visit Diagnosis: Muscle weakness (generalized) (M62.81)   Activity Tolerance Patient tolerated treatment well   Patient Left with call bell/phone within reach;in bed   Nurse Communication Mobility status        Time: 0962-8366 OT Time Calculation (min): 21 min  Charges: OT General Charges $OT Visit: 1  Visit OT Treatments $Self Care/Home Management : 8-22 mins  Norman Herrlich, MS OTR/L  Pager: Wilsey A Chellsie Gomer 01/09/2017, 6:02 PM

## 2017-01-09 NOTE — Progress Notes (Signed)
Physical Therapy Treatment Patient Details Name: Kenneth Pace MRN: [id number] DOB: Oct 09, 1950 Today's Date: 01/09/2017    History of Present Illness Pt is a 66 y/o male admitted secondary to increased weakness in UEs and LEs. Pt previously admitted earlier in October 2018 secondary to suspected GB. Has had rapid decline since working with PT and OT. MRI of thoracic spine revealed disk protrusions with secondary cord flattening at T5-T10. CT negative for acute findings. MRI 11/15 shows multilevel moderate cervical spinal canal stenosis, worst at C3-4 and C5-6 with associated mild cord deformity but no cord signal change. Pt undergoing plasmapharesis.     PT Comments    Pt continues to make steady progress and remains very motivated to maximize independence. Continue to feel pt needs CIR due continued weakness especially of trunk/core that is significantly impacting all mobility.  Follow Up Recommendations  CIR;Supervision for mobility/OOB     Equipment Recommendations  Wheelchair (measurements PT);Wheelchair cushion (measurements PT)    Recommendations for Other Services       Precautions / Restrictions Precautions Precautions: Fall Restrictions Weight Bearing Restrictions: No    Mobility  Bed Mobility Overal bed mobility: Needs Assistance Bed Mobility: Rolling;Sidelying to Sit Rolling: Min guard Sidelying to sit: Mod assist       General bed mobility comments: Pt rolls primarily relying on UE to pull trunk over due to trunk weakness. Sidelying to sit with mod assist to elevate trunk into sitting.   Transfers Overall transfer level: Needs assistance Equipment used: 1 person hand held assist Transfers: Sit to/from Stand Sit to Stand: Mod assist;Min assist         General transfer comment: Pt requires mod assist to bring hips and trunk up due to lack of power in hips and knees. Pt able to rise with min assist if using arms to compensate and thrust body  up.  Ambulation/Gait Ambulation/Gait assistance: Mod assist Ambulation Distance (Feet): 25 Feet Assistive device: 1 person hand held assist;Rolling walker (2 wheeled) Gait Pattern/deviations: Step-through pattern;Decreased stride length;Decreased dorsiflexion - right;Decreased dorsiflexion - left;Narrow base of support Gait velocity: Decreased Gait velocity interpretation: <1.8 ft/sec, indicative of risk for recurrent falls General Gait Details: Pt with external rotation of hips and knees hyperextending bilaterally to provide compensatory stability. Amb 5' without walker. With walker and pt bracing UE's pt able to amb 25'.   Stairs            Wheelchair Mobility    Modified Rankin (Stroke Patients Only)       Balance Overall balance assessment: Needs assistance Sitting-balance support: Feet unsupported;No upper extremity supported Sitting balance-Leahy Scale: Fair Sitting balance - Comments: Pt able to maintain static sitting but unable to move trunk dynamically without assist for balance due to trunk weakness.   Standing balance support: Bilateral upper extremity supported Standing balance-Leahy Scale: Poor Standing balance comment: Pt requires UE support for static standing.                            Cognition Arousal/Alertness: Awake/alert Behavior During Therapy: WFL for tasks assessed/performed Overall Cognitive Status: Within Functional Limits for tasks assessed                                        Exercises Other Exercises Other Exercises: 10 reps sitting posterior/anterior leans with assist for trunk strengthening Other Exercises: 10  reps lateral trunk leans in sitting with assist for trunk strengthening    General Comments        Pertinent Vitals/Pain Pain Assessment: No/denies pain    Home Living                      Prior Function            PT Goals (current goals can now be found in the care plan  section) Progress towards PT goals: Progressing toward goals    Frequency    Min 3X/week      PT Plan Current plan remains appropriate    Co-evaluation              AM-PAC PT "6 Clicks" Daily Activity  Outcome Measure  Difficulty turning over in bed (including adjusting bedclothes, sheets and blankets)?: A Little Difficulty moving from lying on back to sitting on the side of the bed? : Unable Difficulty sitting down on and standing up from a chair with arms (e.g., wheelchair, bedside commode, etc,.)?: Unable Help needed moving to and from a bed to chair (including a wheelchair)?: A Lot Help needed walking in hospital room?: A Lot(with no AD) Help needed climbing 3-5 steps with a railing? : A Lot 6 Click Score: 11    End of Session Equipment Utilized During Treatment: Gait belt Activity Tolerance: Patient tolerated treatment well Patient left: in chair;with call bell/phone within reach Nurse Communication: Mobility status PT Visit Diagnosis: Unsteadiness on feet (R26.81);History of falling (Z91.81);Muscle weakness (generalized) (M62.81);Difficulty in walking, not elsewhere classified (R26.2);Other symptoms and signs involving the nervous system (R29.898)     Time: 5681-2751 PT Time Calculation (min) (ACUTE ONLY): 28 min  Charges:  $Gait Training: 8-22 mins $Therapeutic Activity: 8-22 mins                    G Codes:       [facility] PT [facility] 01/09/2017, 2:13 PM

## 2017-01-09 NOTE — Progress Notes (Signed)
Great effort.  VC 3.35 L  NIF -45

## 2017-01-09 NOTE — Progress Notes (Signed)
PROGRESS NOTE    Kenneth Pace  XBJ:[id number] DOB: Nov 18, 1950 DOA: 12/29/2016 PCP: Renato Shin, MD  Brief [phone] y.o.malewithmedicalhistorysignificant for but not limited tosubacute inflammatory polyradiculoneuropathy, DM on insulin pump,presenting with progressive weakness, numbness and hyperreflexia and history of demyelinating neuropathy on EMG,and felt to have myeloneuropathy. 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 Patient has had MRI which indicated some element of cervical myelopathy due to his constellation ofsigns/symptoms.  Patient was seen and examined at his bedside. He is very optimistic about possible inpatient rehab and has been workingdiligently with PT. Lots of improvement in his strength. Has no complaints this morning.   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 - continue current regimen. Today completed 5 courses  Type 2 diabetes on insulin pump    -Blood sugars stable -A1C 7.3 (11/20/16)   Essential HTN -Stable, lisinopril  BPH -Proscar  Dyslipidemia -Zetia and Crestor held until ore stable  depression  -Stable on BuSpar  DVT prophylaxis: SCD Code Status:FULL Family Communication: directly with  patient. Disposition Plan:  Plan is for him to be discharged to inpatient rehab once done with plasmapheresis.   Consultants neurology, neurosurgery, physical medicine and rehab  Procedures: None Antimicrobials: None   Objective: Vitals:   01/09/17 1045 01/09/17 1100 01/09/17 1120 01/09/17 1225  BP: (!) 141/79 (!) 133/56 (!) 141/76 (!) 146/80  Pulse:  85 74 80  Resp: 17 18 18 19   Temp:  98 F (36.7 C) 98.2 F (36.8 C) 97.9 F (36.6 C)  TempSrc:  Oral Oral Oral  SpO2:  100% 100% 96%  Weight:      Height:        Intake/Output Summary (Last 24 hours) at 01/09/2017 1524 Last data filed at 01/09/2017 1356 Gross per 24 hour  Intake 510 ml  Output 1300 ml  Net -790 ml   Filed Weights   01/08/17 0449 01/09/17 0600 01/09/17 0900  Weight: 103.8 kg (228 lb 14.4 oz) 102 kg (224 lb 13.9 oz) 102 kg (224 lb 13.9 oz)    Examination: Exam unchanged from yesterday 11/23  General exam: Pt in nad, alert and awake Respiratory system: Clear to auscultation. Respiratory effort normal. Equal chest rise. Cardiovascular system: S1 & S2 heard, RRR. No JVD, murmurs, rubs, gallops or clicks. No pedal edema. Gastrointestinal system: Abdomen is nondistended, soft and nontender. No organomegaly or masses felt. Normal bowel sounds heard. Central nervous system: Alert and oriented. Decreased strength in LE bilaterally 3/5.  Skin: No rashes, lesions or ulcers Psychiatry: Mood & affect appropriate.     Data Reviewed: I have personally reviewed following labs and imaging studies  CBC: Recent Labs  Lab 01/02/17 1617  01/05/17 0640 01/05/17 0912 01/07/17 0445 01/07/17 1105 01/09/17 0955  WBC 9.6  --  8.7  --  8.6  --  8.3  NEUTROABS 6.7  --   --   --   --   --  6.2  HGB 16.9   < > 15.8 14.6 15.1 14.6 15.0  HCT 47.0   < > 44.8 43.0 42.8 43.0 42.9  MCV 86.6  --  87.5  --  87.3  --  88.3  PLT 182  --  107*  --  112*  --  152   < > = values in this interval not displayed.   Basic Metabolic  Panel: Recent Labs  Lab 01/05/17 0640 01/05/17 0912 01/07/17 0445 01/07/17 1105 01/09/17 0955  NA 140 141 136 139 136  K 4.0 3.9 3.8 4.1 3.9  CL 106 100* 105 101 104  CO2 28  --  26  --  26  GLUCOSE 118* 115* 291* 240* 127*  BUN 17 20 15 15 12   CREATININE 0.73 0.70 0.76 0.70 0.69  CALCIUM 9.0  --  8.4*  --  8.7*  PHOS  --   --   --   --  3.4   GFR: Estimated Creatinine Clearance: 115.3 mL/min (by C-G formula based on SCr of 0.69 mg/dL). Liver Function Tests: Recent Labs  Lab 01/05/17 0640 01/09/17 0955  AST 21  --   ALT 17  --   ALKPHOS 85  --   BILITOT 0.7  --   PROT 5.4*  --   ALBUMIN 4.0 3.9   No results for input(s): LIPASE, AMYLASE in the last 168 hours. No results for input(s): AMMONIA in the last 168 hours. Coagulation Profile: Recent Labs  Lab 01/05/17 1351  INR 1.96   Cardiac Enzymes: Recent Labs  Lab 01/05/17 1351  CKTOTAL 55   BNP (last 3 results) No results for input(s): PROBNP in the last 8760 hours. HbA1C: No results for input(s): HGBA1C in the last 72 hours. CBG: Recent Labs  Lab 01/08/17 1126 01/08/17 1654 01/08/17 2116 01/09/17 0559 01/09/17 1223  GLUCAP 209* 163* 148* 84 107*   Lipid Profile: No results for input(s): CHOL, HDL, LDLCALC, TRIG, CHOLHDL, LDLDIRECT in the last 72 hours. Thyroid Function Tests: No results for input(s): TSH, T4TOTAL, FREET4, T3FREE, THYROIDAB in the last 72 hours. Anemia Panel: No results for input(s): VITAMINB12, FOLATE, FERRITIN, TIBC, IRON, RETICCTPCT in the last 72 hours. Sepsis Labs: No results for input(s): PROCALCITON, LATICACIDVEN in the last 168 hours.  No results found for this or any previous visit (from the past 240 hour(s)).   Radiology Studies: No results found.  Scheduled Meds: . busPIRone  30 mg Oral QHS  . finasteride  5 mg Oral Daily  . heparin  1,000 Units Intracatheter Once  . heparin  1,000 Units Intracatheter Once  . insulin pump   Subcutaneous TID AC, HS, 0200  .  lisinopril  2.5 mg Oral Daily  . multivitamin with minerals  1 tablet Oral Daily  . polyethylene glycol  17 g Oral BID  . vitamin B-12  1,000 mcg Oral Daily  . vitamin E  100 Units Oral Daily   Continuous Infusions: . citrate dextrose    . citrate dextrose    . citrate dextrose       LOS: 10 days    Velvet Bathe, MD [facility]  If 7PM-7AM, please contact night-coverage [email or web address] Password [email or web address] 01/09/2017, 3:24 PM

## 2017-01-10 LAB — GLUCOSE, CAPILLARY
GLUCOSE-CAPILLARY: 109 mg/dL — AB (ref 65–99)
GLUCOSE-CAPILLARY: 217 mg/dL — AB (ref 65–99)
GLUCOSE-CAPILLARY: 100 mg/dL — AB (ref 65–99)
Glucose-Capillary: 144 mg/dL — ABNORMAL HIGH (ref 65–99)

## 2017-01-10 NOTE — Progress Notes (Signed)
VC 4L NIF -47

## 2017-01-10 NOTE — Progress Notes (Signed)
Patient arrived to the unit 1315 pm. Pt. Is alert and Oriented x 4.   Denies pain.  Bilateral weakness: LLE and LRE  Oriented to equipment in the room. Nurse addressed all question and concerns.

## 2017-01-10 NOTE — H&P (Signed)
Patient to transfer 3W09 report given to receiving nurse, all questions answered at this time.  Pt. VSS with no s/s of distress noted.  Patient stable at transfer.

## 2017-01-10 NOTE — Progress Notes (Signed)
RT attempted to do NIF and VC twice and pt was unavailable.  RT will attempt this evening.

## 2017-01-10 NOTE — Progress Notes (Signed)
At this time he has finished his 5 doses of PLEX and will need time to see if he continues to improve.  He is planning to go to CIR which I agree is a good choice. IF no improvement wouldreconsult neurosurgery for possible cervical decompression. At this time no further neurology recommendations. Feel free to call with questions. Neurology S/O  Etta Quill PA-C [facility] [phone]  M-F  (8:30 am- 4 PM)  01/10/2017, 8:57 AM

## 2017-01-10 NOTE — Progress Notes (Signed)
VC 4L  NIF -45

## 2017-01-11 ENCOUNTER — Other Ambulatory Visit: Payer: Self-pay

## 2017-01-11 ENCOUNTER — Inpatient Hospital Stay (HOSPITAL_COMMUNITY)
Admission: RE | Admit: 2017-01-11 | Discharge: 2017-02-01 | DRG: 091 | Disposition: A | Discharge status: Other Acute Inpatient Hospital | Payer: BLUE CROSS/BLUE SHIELD | Source: Intra-hospital | Attending: Physical Medicine & Rehabilitation | Admitting: Physical Medicine & Rehabilitation

## 2017-01-11 ENCOUNTER — Encounter (HOSPITAL_COMMUNITY): Payer: Self-pay | Admitting: *Deleted

## 2017-01-11 DIAGNOSIS — M4802 Spinal stenosis, cervical region: Secondary | ICD-10-CM | POA: Diagnosis not present

## 2017-01-11 DIAGNOSIS — Z79899 Other long term (current) drug therapy: Secondary | ICD-10-CM

## 2017-01-11 DIAGNOSIS — Z8249 Family history of ischemic heart disease and other diseases of the circulatory system: Secondary | ICD-10-CM

## 2017-01-11 DIAGNOSIS — M62838 Other muscle spasm: Secondary | ICD-10-CM | POA: Diagnosis not present

## 2017-01-11 DIAGNOSIS — R0602 Shortness of breath: Secondary | ICD-10-CM | POA: Diagnosis not present

## 2017-01-11 DIAGNOSIS — R2689 Other abnormalities of gait and mobility: Secondary | ICD-10-CM | POA: Diagnosis not present

## 2017-01-11 DIAGNOSIS — M4712 Other spondylosis with myelopathy, cervical region: Secondary | ICD-10-CM | POA: Diagnosis not present

## 2017-01-11 DIAGNOSIS — G4733 Obstructive sleep apnea (adult) (pediatric): Secondary | ICD-10-CM | POA: Diagnosis not present

## 2017-01-11 DIAGNOSIS — K5900 Constipation, unspecified: Secondary | ICD-10-CM | POA: Diagnosis not present

## 2017-01-11 DIAGNOSIS — G610 Guillain-Barre syndrome: Secondary | ICD-10-CM | POA: Diagnosis present

## 2017-01-11 DIAGNOSIS — F411 Generalized anxiety disorder: Secondary | ICD-10-CM | POA: Diagnosis not present

## 2017-01-11 DIAGNOSIS — G378 Other specified demyelinating diseases of central nervous system: Secondary | ICD-10-CM | POA: Diagnosis not present

## 2017-01-11 DIAGNOSIS — R338 Other retention of urine: Secondary | ICD-10-CM | POA: Diagnosis not present

## 2017-01-11 DIAGNOSIS — N401 Enlarged prostate with lower urinary tract symptoms: Secondary | ICD-10-CM | POA: Diagnosis present

## 2017-01-11 DIAGNOSIS — Z794 Long term (current) use of insulin: Secondary | ICD-10-CM | POA: Diagnosis not present

## 2017-01-11 DIAGNOSIS — N319 Neuromuscular dysfunction of bladder, unspecified: Secondary | ICD-10-CM

## 2017-01-11 DIAGNOSIS — Z9641 Presence of insulin pump (external) (internal): Secondary | ICD-10-CM | POA: Diagnosis present

## 2017-01-11 DIAGNOSIS — R7309 Other abnormal glucose: Secondary | ICD-10-CM

## 2017-01-11 DIAGNOSIS — E871 Hypo-osmolality and hyponatremia: Secondary | ICD-10-CM | POA: Diagnosis not present

## 2017-01-11 DIAGNOSIS — E1042 Type 1 diabetes mellitus with diabetic polyneuropathy: Secondary | ICD-10-CM | POA: Diagnosis present

## 2017-01-11 DIAGNOSIS — I10 Essential (primary) hypertension: Secondary | ICD-10-CM | POA: Diagnosis not present

## 2017-01-11 DIAGNOSIS — G8250 Quadriplegia, unspecified: Secondary | ICD-10-CM | POA: Diagnosis not present

## 2017-01-11 DIAGNOSIS — N400 Enlarged prostate without lower urinary tract symptoms: Secondary | ICD-10-CM | POA: Diagnosis present

## 2017-01-11 DIAGNOSIS — E162 Hypoglycemia, unspecified: Secondary | ICD-10-CM

## 2017-01-11 DIAGNOSIS — E1142 Type 2 diabetes mellitus with diabetic polyneuropathy: Secondary | ICD-10-CM | POA: Diagnosis not present

## 2017-01-11 DIAGNOSIS — M792 Neuralgia and neuritis, unspecified: Secondary | ICD-10-CM | POA: Diagnosis not present

## 2017-01-11 DIAGNOSIS — E10649 Type 1 diabetes mellitus with hypoglycemia without coma: Secondary | ICD-10-CM | POA: Diagnosis not present

## 2017-01-11 DIAGNOSIS — E785 Hyperlipidemia, unspecified: Secondary | ICD-10-CM | POA: Diagnosis not present

## 2017-01-11 DIAGNOSIS — G6181 Chronic inflammatory demyelinating polyneuritis: Secondary | ICD-10-CM | POA: Diagnosis not present

## 2017-01-11 HISTORY — DX: Anxiety disorder, unspecified: F41.9

## 2017-01-11 LAB — BASIC METABOLIC PANEL
ANION GAP: 6 (ref 5–15)
BUN: 11 mg/dL (ref 6–20)
CALCIUM: 8.7 mg/dL — AB (ref 8.9–10.3)
CHLORIDE: 102 mmol/L (ref 101–111)
CO2: 28 mmol/L (ref 22–32)
CREATININE: 0.75 mg/dL (ref 0.61–1.24)
GFR calc non Af Amer: >60 mL/min (ref >60)
GLUCOSE: 138 mg/dL — AB (ref 65–99)
Potassium: 4 mmol/L (ref 3.5–5.1)
Sodium: 136 mmol/L (ref 135–145)

## 2017-01-11 LAB — GLUCOSE, CAPILLARY
GLUCOSE-CAPILLARY: 96 mg/dL (ref 65–99)
Glucose-Capillary: 49 mg/dL — ABNORMAL LOW (ref 65–99)
Glucose-Capillary: 102 mg/dL — ABNORMAL HIGH (ref 65–99)
Glucose-Capillary: 41 mg/dL — CL (ref 65–99)
Glucose-Capillary: 184 mg/dL — ABNORMAL HIGH (ref 65–99)
Glucose-Capillary: 170 mg/dL — ABNORMAL HIGH (ref 65–99)

## 2017-01-11 LAB — CBC
HCT: 43.8 % (ref 39.0–52.0)
HEMOGLOBIN: 15.2 g/dL (ref 13.0–17.0)
MCH: 30.5 pg (ref 26.0–34.0)
MCHC: 34.7 g/dL (ref 30.0–36.0)
MCV: 88 fL (ref 78.0–100.0)
PLATELETS: 185 10*3/uL (ref 150–400)
RBC: 4.98 MIL/uL (ref 4.22–5.81)
RDW: 13.5 % (ref 11.5–15.5)
WBC: 9.5 10*3/uL (ref 4.0–10.5)

## 2017-01-11 LAB — POCT I-STAT, CHEM 8
BUN: 13 mg/dL (ref 6–20)
CALCIUM ION: 1.26 mmol/L (ref 1.15–1.40)
CHLORIDE: 101 mmol/L (ref 101–111)
Creatinine, Ser: 0.7 mg/dL (ref 0.61–1.24)
Glucose, Bld: 126 mg/dL — ABNORMAL HIGH (ref 65–99)
HEMATOCRIT: 43 % (ref 39.0–52.0)
HEMOGLOBIN: 14.6 g/dL (ref 13.0–17.0)
POTASSIUM: 3.9 mmol/L (ref 3.5–5.1)
Sodium: 140 mmol/L (ref 135–145)
TCO2: 26 mmol/L (ref 22–32)

## 2017-01-11 MED ORDER — VITAMIN B-12 1000 MCG PO TABS
1000.0000 ug | ORAL_TABLET | Freq: Every day | ORAL | Status: DC
  Administered 2017-01-11 – 2017-01-31 (×21): 1000 ug via ORAL
  Filled 2017-01-11 (×21): qty 1

## 2017-01-11 MED ORDER — ORAL CARE MOUTH RINSE
15.0000 mL | Freq: Two times a day (BID) | OROMUCOSAL | Status: DC
  Administered 2017-01-12 – 2017-01-13 (×2): 15 mL via OROMUCOSAL

## 2017-01-11 MED ORDER — TRAMADOL HCL 50 MG PO TABS
50.0000 mg | ORAL_TABLET | Freq: Four times a day (QID) | ORAL | Status: DC | PRN
  Administered 2017-01-22 – 2017-01-29 (×4): 50 mg via ORAL
  Filled 2017-01-11 (×7): qty 1

## 2017-01-11 MED ORDER — FINASTERIDE 5 MG PO TABS
5.0000 mg | ORAL_TABLET | Freq: Every day | ORAL | Status: DC
  Administered 2017-01-11 – 2017-01-31 (×21): 5 mg via ORAL
  Filled 2017-01-11 (×21): qty 1

## 2017-01-11 MED ORDER — POLYETHYLENE GLYCOL 3350 17 G PO PACK
17.0000 g | PACK | Freq: Two times a day (BID) | ORAL | Status: DC
  Administered 2017-01-19 – 2017-01-31 (×19): 17 g via ORAL
  Filled 2017-01-11 (×37): qty 1

## 2017-01-11 MED ORDER — PREMIER PROTEIN SHAKE
2.0000 [oz_av] | Freq: Four times a day (QID) | ORAL | Status: DC
  Administered 2017-01-12 – 2017-01-21 (×31): 2 [oz_av] via ORAL
  Filled 2017-01-11 (×57): qty 325.31

## 2017-01-11 MED ORDER — INSULIN PUMP
Freq: Three times a day (TID) | SUBCUTANEOUS | Status: DC
  Administered 2017-01-11 – 2017-01-13 (×7): via SUBCUTANEOUS
  Administered 2017-01-13: 4 via SUBCUTANEOUS
  Administered 2017-01-14: 2 via SUBCUTANEOUS
  Administered 2017-01-14: 4 via SUBCUTANEOUS
  Administered 2017-01-14: 6 via SUBCUTANEOUS
  Administered 2017-01-16 (×2): via SUBCUTANEOUS
  Administered 2017-01-17: 6 via SUBCUTANEOUS
  Administered 2017-01-18 – 2017-01-19 (×2): 4 via SUBCUTANEOUS
  Administered 2017-01-19: 07:00:00 via SUBCUTANEOUS
  Administered 2017-01-20: 5 via SUBCUTANEOUS
  Administered 2017-01-20 – 2017-01-24 (×4): via SUBCUTANEOUS
  Administered 2017-01-24: 1 via SUBCUTANEOUS
  Administered 2017-01-24 – 2017-01-25 (×2): via SUBCUTANEOUS
  Administered 2017-01-25: 2 via SUBCUTANEOUS
  Administered 2017-01-26: 22:00:00 via SUBCUTANEOUS
  Administered 2017-01-26: 5 via SUBCUTANEOUS
  Administered 2017-01-27 – 2017-01-31 (×16): via SUBCUTANEOUS
  Filled 2017-01-11: qty 1

## 2017-01-11 MED ORDER — BISACODYL 10 MG RE SUPP
10.0000 mg | Freq: Every day | RECTAL | Status: DC | PRN
  Administered 2017-01-31: 10 mg via RECTAL
  Filled 2017-01-11: qty 1

## 2017-01-11 MED ORDER — VITAMIN E 45 MG (100 UNIT) PO CAPS
100.0000 [IU] | ORAL_CAPSULE | Freq: Every day | ORAL | Status: DC
  Administered 2017-01-11 – 2017-01-31 (×21): 100 [IU] via ORAL
  Filled 2017-01-11 (×21): qty 1

## 2017-01-11 MED ORDER — PROCHLORPERAZINE EDISYLATE 5 MG/ML IJ SOLN: 5.0000 mg | Freq: Four times a day (QID) | INTRAMUSCULAR | Status: DC | PRN

## 2017-01-11 MED ORDER — MAGNESIUM HYDROXIDE 400 MG/5ML PO SUSP
30.0000 mL | Freq: Every day | ORAL | Status: DC | PRN
  Administered 2017-01-26 – 2017-01-31 (×2): 30 mL via ORAL
  Filled 2017-01-11 (×5): qty 30

## 2017-01-11 MED ORDER — DIPHENHYDRAMINE HCL 12.5 MG/5ML PO ELIX: 12.5000 mg | ORAL_SOLUTION | Freq: Four times a day (QID) | ORAL | Status: DC | PRN

## 2017-01-11 MED ORDER — ENOXAPARIN SODIUM 40 MG/0.4ML ~~LOC~~ SOLN
40.0000 mg | SUBCUTANEOUS | Status: DC
  Administered 2017-01-11 – 2017-01-30 (×20): 40 mg via SUBCUTANEOUS
  Filled 2017-01-11 (×23): qty 0.4

## 2017-01-11 MED ORDER — LISINOPRIL 2.5 MG PO TABS
2.5000 mg | ORAL_TABLET | Freq: Every day | ORAL | Status: DC
  Administered 2017-01-11 – 2017-01-31 (×21): 2.5 mg via ORAL
  Filled 2017-01-11 (×21): qty 1

## 2017-01-11 MED ORDER — PROCHLORPERAZINE MALEATE 5 MG PO TABS: 5.0000 mg | ORAL_TABLET | Freq: Four times a day (QID) | ORAL | Status: DC | PRN

## 2017-01-11 MED ORDER — ACETAMINOPHEN 325 MG PO TABS: 325.0000 mg | ORAL_TABLET | ORAL | Status: DC | PRN

## 2017-01-11 MED ORDER — POLYETHYLENE GLYCOL 3350 17 G PO PACK
17.0000 g | PACK | Freq: Every day | ORAL | Status: DC | PRN
  Filled 2017-01-11: qty 1

## 2017-01-11 MED ORDER — ALUM & MAG HYDROXIDE-SIMETH 200-200-20 MG/5ML PO SUSP: 30.0000 mL | ORAL | Status: DC | PRN

## 2017-01-11 MED ORDER — GUAIFENESIN-DM 100-10 MG/5ML PO SYRP: 5.0000 mL | ORAL_SOLUTION | Freq: Four times a day (QID) | ORAL | Status: DC | PRN

## 2017-01-11 MED ORDER — TRAZODONE HCL 50 MG PO TABS
25.0000 mg | ORAL_TABLET | Freq: Every evening | ORAL | Status: DC | PRN
  Administered 2017-01-18 – 2017-01-23 (×3): 50 mg via ORAL
  Filled 2017-01-11 (×4): qty 1

## 2017-01-11 MED ORDER — FLEET ENEMA 7-19 GM/118ML RE ENEM: 1.0000 | ENEMA | Freq: Once | RECTAL | Status: DC | PRN

## 2017-01-11 MED ORDER — PROCHLORPERAZINE 25 MG RE SUPP
12.5000 mg | Freq: Four times a day (QID) | RECTAL | Status: DC | PRN
  Filled 2017-01-11: qty 1

## 2017-01-11 MED ORDER — ADULT MULTIVITAMIN W/MINERALS CH
1.0000 | ORAL_TABLET | Freq: Every day | ORAL | Status: DC
  Administered 2017-01-11 – 2017-01-31 (×21): 1 via ORAL
  Filled 2017-01-11 (×22): qty 1

## 2017-01-11 NOTE — Progress Notes (Addendum)
Inpatient Rehabilitation  Continuing to follow for insurance authorization in hopes of admitting to IP Rehab today.  Pre-authorization request was initiated 01/05/17 with updates faxed this morning.  Plan to follow up with the team as soon as I have authorization.  Call if questions.   Carmelia Roller., CCC/SLP Admission Coordinator  [place]  Cell [phone]

## 2017-01-11 NOTE — Progress Notes (Signed)
CBG 49, administer juice. Alert and oriented. Asymptomatic. Rechecked 41. Also asymptomatic. Refused D50. Administer more juice. Rechecked 102. Dr. Wendee Beavers notified.

## 2017-01-11 NOTE — IPOC Note (Signed)
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[place])   Type 2 diabetes mellitus with peripheral neuropathy (HCC)   Generalized anxiety disorder     Functional Problem List: Nursing Endurance, Medication Management, Motor, Nutrition, Safety, Sensory, Skin Integrity  PT Balance, Sensory, Endurance, Motor, Safety  OT Balance, Endurance, Safety, Sensory, Motor  SLP    TR         Basic ADL's: OT Eating, Grooming, Bathing, Dressing, Toileting     Advanced  ADL's: OT       Transfers: PT Bed Mobility, Bed to Chair, Car, Manufacturing systems engineer, Metallurgist: PT Ambulation, Emergency planning/management officer, Stairs     Additional Impairments: OT Fuctional Use of Upper Extremity  SLP        TR      Anticipated Outcomes Item Anticipated Outcome  Self Feeding Mod I  Swallowing      Basic self-care  Mod I  Toileting  Mod I   Bathroom Transfers Supervision-mod I  Bowel/Bladder  continent of bowel and bladder.  Transfers  modI  Locomotion  modI  Communication     Cognition     Pain  Denies pain.  Safety/Judgment  supervision.   Therapy Plan: PT Intensity: Minimum of 1-2 x/day ,45 to 90 minutes PT Frequency: 5 out of 7 days PT Duration Estimated Length of Stay: 12-14 days OT Intensity: Minimum of 1-2 x/day, 45 to 90 minutes OT Frequency: 5 out of 7 days OT Duration/Estimated Length of Stay: 12-14 days      Team Interventions: Nursing Interventions Patient/Family Education, Disease Management/Prevention, Skin Care/Wound Management, Discharge Planning, Medication Management  PT interventions Ambulation/gait training, Discharge planning, Functional mobility training, Psychosocial support, Therapeutic Activities, Wheelchair propulsion/positioning, Therapeutic Exercise, Skin care/wound  management, Neuromuscular re-education, Disease management/prevention, Training and development officer, DME/adaptive equipment instruction, UE/LE Strength taining/ROM, UE/LE Coordination activities, Stair training, Patient/family education, Functional electrical stimulation, Community reintegration  OT Interventions Training and development officer, Academic librarian, Discharge planning, Functional mobility training, Neuromuscular re-education, Patient/family education, Self Care/advanced ADL retraining, Pain management, Psychosocial support, Skin care/wound managment, Therapeutic Activities, Therapeutic Exercise, UE/LE Strength taining/ROM, UE/LE Coordination activities, Functional electrical stimulation  SLP Interventions    TR Interventions    SW/CM Interventions Discharge Planning, Psychosocial Support, Patient/Family Education   Barriers to Discharge MD  Medical stability  Nursing Medical stability, Home environment access/layout    PT      OT      SLP      SW       Team Discharge Planning: Destination: PT-Home ,OT- Home , SLP-  Projected Follow-up: PT-Outpatient PT, OT-  Outpatient OT, SLP-  Projected Equipment Needs: PT-To be determined, OT- To be determined, SLP-  Equipment Details: PT-has cane, RW, rollator, OT-  Patient/family involved in discharge planning: PT- Patient,  OT-Patient, SLP-   MD ELOS: 10-14 days. Medical Rehab Prognosis:  Good Assessment: 66 y.o. 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  Patient recently returned from cruise --used electric scooter to get around. He was readmitted on 12/29/16 with reports of progressive decline that started during his trip with tightness around abdomen with difficulty breathing, progressive difficulty walking with numbness,  difficulty swallowing and inability to void. Has been evaluated by neurology with  evidence of demyelinating neuropathy on EMG.  Dr. Rory Percy recommended evaluation for polyradiculoneuropathy v/s transverse myelitis--doubt GBS. MRI thoracic spine revealed multifocal disc protrusions with cord flattening T5-T10 without significant stenosis. 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  Work up underway to rule out paraneoplastic panel.  LP done revealing elevated protein 121 and WBC - 7. MRI brain reviewed, unremarkable for acute process.  Per report brain/cervical spine MRI repeated and was negative for acute intracranial abnormality and multilevel moderate stenosis C3/4 and C5/6 with mild cord deformity but no signal changes. CT abdomen/pelvis done to rule out paraneoplastic syndrome and was negative for acute abnormality. Incidental findings of fairly large amount of stool in colon,  thickened bladder wall --favor chronic neurogenic bladder and enlarged prostate causing mass effect on bladder.  Dr. Kathyrn Sheriff consulted and recommended trial of plasma exchange as symptoms favor AIDP but could have component of symptomatic cervical myelopathy. If he does not show improvement to consider decompression of cervical spondylosis. He completed 5 rounds of treatment and is showing some improvement. NIF stable and tends to have clawing right hand with fatigue. Patient with resulting functional deficits with mobility, transfers, ability to complete ADLs.  Will set goals for Supervision/Mod I with PT/OT.   See Team Conference Notes for weekly updates to the plan of care
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
## 2017-01-11 NOTE — Progress Notes (Signed)
NIF -40Plus, VC 3.5L  Good effort

## 2017-01-11 NOTE — Progress Notes (Signed)
PROGRESS NOTE    Kenneth Pace  OEU:[id number] DOB: 1950-10-26 DOA: 12/29/2016 PCP: Renato Shin, MD  Brief [id number] y.o.malewithmedicalhistorysignificant for but not limited tosubacute inflammatory polyradiculoneuropathy, DM on insulin pump,presenting with progressive weakness, numbness and hyperreflexia and history of demyelinating neuropathy on EMG,and felt to have myeloneuropathy. 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 Patient has had MRI which indicated some element of cervical myelopathy due to his constellation ofsigns/symptoms.  Patient was seen and examined at his bedside. He is very optimistic about possible inpatient rehab and has been workingdiligently with PT. Lots of improvement in his strength. Has no complaints this morning.   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 Currently stable will transfer to medical floor - Plan is for inpatient rehab services. If in the near future no improvement neurology recommends neurosurgery consult.   Type 2 diabetes on insulin pump    -Blood sugars stable -A1C 7.3 (11/20/16)   Essential HTN -will continue lisinopril  BPH -Proscar  Dyslipidemia -Zetia and Crestor held until ore stable  depression  -Stable on BuSpar  DVT prophylaxis:  SCD Code Status: FULL Family Communication: directly with patient. Disposition Plan:  Plan is for him to be discharged to inpatient rehab once done with plasmapheresis.   Consultants neurology, neurosurgery, physical medicine and rehab  Procedures: None Antimicrobials: None   Objective: Vitals:   01/11/17 0042 01/11/17 0520 01/11/17 0950 01/11/17 1417  BP: (!) 116/58 131/70 128/63 108/69  Pulse: 73 66 77 88  Resp: 18 18 20 20   Temp: 97.7 F (36.5 C) 97.7 F (36.5 C) 98.4 F (36.9 C) 98.1 F (36.7 C)  TempSrc: Oral Oral Oral Oral  SpO2: 97% 98% 99% 94%  Weight:      Height:        Intake/Output Summary (Last 24 hours) at 01/11/2017 1452 Last data filed at 01/11/2017 0900 Gross per 24 hour  Intake 120 ml  Output -  Net 120 ml   Filed Weights   01/09/17 0600 01/09/17 0900 01/10/17 0432  Weight: 102 kg (224 lb 13.9 oz) 102 kg (224 lb 13.9 oz) 102.4 kg (225 lb 12 oz)    Examination:  General exam: Pt in nad, alert and awake Respiratory system: Clear to auscultation. Respiratory effort normal. Equal chest rise. Cardiovascular system: S1 & S2 heard, RRR. No JVD, murmurs, rubs, gallops or clicks. No pedal edema. Gastrointestinal system: Abdomen is nondistended, soft and nontender. No organomegaly or masses felt. Normal bowel sounds heard. Central nervous system: Alert and oriented. Decreased strength in LE bilaterally 3/5.  Skin: No rashes, lesions or ulcers, on limited exam. Psychiatry: Mood & affect appropriate.   Data Reviewed: I have personally reviewed following labs and imaging studies  CBC: Recent Labs  Lab 01/05/17 0640 01/05/17 0912 01/07/17 0445 01/07/17 1105 01/09/17 0928 01/09/17 0955  WBC 8.7  --  8.6  --   --  8.3  NEUTROABS  --   --   --   --   --  6.2  HGB 15.8 14.6 15.1 14.6 14.6 15.0  HCT 44.8 43.0 42.8 43.0 43.0 42.9  MCV 87.5  --  87.3  --   --  88.3  PLT 107*  --  112*  --   --  481   Basic Metabolic Panel: Recent Labs  Lab  01/05/17 0640 01/05/17 0912 01/07/17 0445 01/07/17 1105 01/09/17 0928 01/09/17 0955  NA 140 141 136 139 140 136  K 4.0 3.9 3.8 4.1 3.9 3.9  CL 106 100* 105 101 101 104  CO2 28  --  26  --   --  26  GLUCOSE 118* 115* 291* 240* 126* 127*  BUN 17 20 15 15 13 12   CREATININE 0.73 0.70 0.76 0.70 0.70 0.69  CALCIUM 9.0  --  8.4*  --   --  8.7*  PHOS  --   --   --   --   --  3.4   GFR: Estimated Creatinine Clearance: 115.5 mL/min (by C-G formula based on SCr of 0.69 mg/dL). Liver Function Tests: Recent Labs  Lab 01/05/17 0640 01/09/17 0955  AST 21  --   ALT 17  --   ALKPHOS 85  --   BILITOT 0.7  --   PROT 5.4*  --   ALBUMIN 4.0 3.9   No results for input(s): LIPASE, AMYLASE in the last 168 hours. No results for input(s): AMMONIA in the last 168 hours. Coagulation Profile: Recent Labs  Lab 01/05/17 1351  INR 1.96   Cardiac Enzymes: Recent Labs  Lab 01/05/17 1351  CKTOTAL 55   BNP (last 3 results) No results for input(s): PROBNP in the last 8760 hours. HbA1C: No results for input(s): HGBA1C in the last 72 hours. CBG: Recent Labs  Lab 01/10/17 2112 01/11/17 0629 01/11/17 0655 01/11/17 0723 01/11/17 1155  GLUCAP 100* 49* 41* 102* 184*   Lipid Profile: No results for input(s): CHOL, HDL, LDLCALC, TRIG, CHOLHDL, LDLDIRECT in the last 72 hours. Thyroid Function Tests: No results for input(s): TSH, T4TOTAL, FREET4, T3FREE, THYROIDAB in the last 72 hours. Anemia Panel: No results for input(s): VITAMINB12, FOLATE, FERRITIN, TIBC, IRON, RETICCTPCT in the last 72 hours. Sepsis Labs: No results for input(s): PROCALCITON, LATICACIDVEN in the last 168 hours.  No results found for this or any previous visit (from the past 240 hour(s)).   Radiology Studies: No results found.  Scheduled Meds: . busPIRone  30 mg Oral QHS  . finasteride  5 mg Oral Daily  . insulin pump   Subcutaneous TID AC, HS, 0200  . lisinopril  2.5 mg Oral Daily  . multivitamin with minerals  1  tablet Oral Daily  . polyethylene glycol  17 g Oral BID  . vitamin B-12  1,000 mcg Oral Daily  . vitamin E  100 Units Oral Daily   Continuous Infusions:    LOS: 12 days    Velvet Bathe, MD [facility]  If 7PM-7AM, please contact night-coverage [email or web address] Password [email or web address] 01/11/2017, 2:52 PM

## 2017-01-11 NOTE — Progress Notes (Signed)
Patient received from 3 W 09 at approximately 1726 alert and oriented x 4. Insulin pump noted in right lower abd. Intact. Oriented patient and family to room and call bell system. Patient and family verbalized understanding of safety  Protocol. Continue with plan of care.  Kenneth Pace

## 2017-01-11 NOTE — Progress Notes (Signed)
Report given to rehab nurse. Patient and wife were updated.

## 2017-01-11 NOTE — Progress Notes (Signed)
Inpatient Rehabilitation  I have received insurance authorization to admit patient to IP Rehab today.  I have received acute medical clearance and have a bed to offer.  Will proceed with admission.  Updated team.  Call if questions.    Carmelia Roller., CCC/SLP Admission Coordinator  [place]  Cell [phone]

## 2017-01-11 NOTE — Progress Notes (Signed)
Inpatient Rehabilitation  Have left a message for assigned case manager at [facility] requesting an update on the case status.  Patient's spouse has also called to escalate the request.  Plan to update the team as I know.    Carmelia Roller., CCC/SLP Admission Coordinator  [place]  Cell [phone]

## 2017-01-11 NOTE — Progress Notes (Signed)
Skin assessment done. Multiple scabs/old abrasions to right and left lower leg. Patient stated that they were old abrasions/scabs from doing yard work/weed eater. Fungal nail to Right and Left toes and callous to right toe bottom area. Patient also claims he uses file to minimize the callous. Redness to bilateral buttocks that's blanchable. Extra barrier cream applied.

## 2017-01-11 NOTE — Discharge Summary (Signed)
Physician Discharge Summary  Kenneth Pace [facility]:[id number] DOB: 03-27-1950 DOA: 12/29/2016  PCP: Renato Shin, MD  Admit date: 12/29/2016 Discharge date: 01/11/2017  Time spent: > 35 minutes  Recommendations for Outpatient Follow-up:  1. Please decide when to continue statin and zetia   Discharge Diagnoses:  Principal Problem:   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[facility]) Active Problems:   Dyslipidemia   Essential hypertension   BPH (benign prostatic hyperplasia)   Diabetes mellitus without complication (H[facility])   Depression   Leg weakness, bilateral   Discharge Condition: stable  Diet recommendation: Heart healthy  Filed Weights   01/09/17 0600 01/09/17 0900 01/10/17 0432  Weight: 102 kg (224 lb 13.9 oz) 102 kg (224 lb 13.9 oz) 102.4 kg (225 lb 12 oz)    History of present illness:  66 y.o.malewithmedicalhistorysignificant for but not limited tosubacute inflammatory polyradiculoneuropathy, DM on insulin pump,presenting with progressive weakness, numbness and hyperreflexia and history of demyelinating neuropathy on EMG,and felt to have myeloneuropathy. 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 Patient has had MRI which indicated some element of cervical myelopathy due to his constellation ofsigns/symptoms.    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  - Plan is for inpatient rehab services. If in the near future no improvement neurology recommends neurosurgery consult.   Type 2 diabetes on insulin pump   -recommend continuing to  monitor blood sugars while at rehab -A1C 7.3 (11/20/16)  Essential HTN -will continue lisinopril  BPH -Proscar  Dyslipidemia -Zetia and Crestor held please decide when to continue.   depression  -Stable on BuSpar  Procedures:  See above  Consultations:  None  Discharge Exam: Vitals:   01/11/17 0950 01/11/17 1417  BP: 128/63 108/69  Pulse: 77 88  Resp: 20 20  Temp: 98.4 F (36.9 C) 98.1 F (36.7 C)  SpO2: 99% 94%    General: Pt in nad, alert and awake Cardiovascular: rrr, no rubs Respiratory: no increased wob, no wheezes  Discharge Instructions   Discharge Instructions    Call MD for:  extreme fatigue   Complete by:  As directed    Call MD for:  severe uncontrolled pain   Complete by:  As directed    Call MD for:  temperature >100.4   Complete by:  As directed    Diet - low sodium heart healthy   Complete by:  As directed    Increase activity slowly   Complete by:  As directed      Current Discharge Medication List    CONTINUE these medications which have NOT CHANGED   Details  acetaminophen (TYLENOL) 325 MG tablet Take 2 tablets (650 mg total) by mouth every 6 (six) hours as needed for mild pain or headache.    busPIRone (BUSPAR) 30 MG tablet TAKE ONE-HALF (1/2) TABLET (15 MG) TWICE A DAY Qty: 180 tablet, Refills: 1    co-enzyme Q-10 30 MG capsule Take 30 mg by mouth daily.      Continuous Glucose Monitor Sup (ENLITE GLUCOSE SENSOR) MISC 1 Device by Does not apply route every 3 (three) days. Qty: 30 each, Refills: 3    Cranberry 400 MG TABS Take 2 each by mouth daily.  Cyanocobalamin (VITAMIN B-12 CR PO) Take 1 each by mouth daily.      finasteride (PROSCAR) 5 MG tablet Take 5 mg by mouth daily.     glucagon 1 MG injection Inject 1 mg into the vein once as needed. Qty: 1 each, Refills: 12    glucose blood (BAYER CONTOUR NEXT TEST) test strip Use to check blood sugar 5 times per day dx 250.01, Qty: 500 each, Refills: 3     HUMALOG 100 UNIT/ML injection INJECT 120 UNITS DAILY IN PUMP Qty: 100 mL, Refills: 11    !! Insulin Infusion Pump Supplies (PARADIGM RESERVOIR 3ML) MISC 1 Device by Does not apply route every 3 (three) days. Qty: 30 each, Refills: 3    !! Insulin Infusion Pump Supplies (QUICK-SET INFUSION 43" 9MM) MISC 1 Device by Does not apply route every 3 (three) days. Qty: 30 each, Refills: 3    Lancets Misc. (ACCU-CHEK MULTICLIX LANCET DEV) KIT 1 Device by Other route 5 (five) times daily as needed. 5/day dx 250.01 Qty: 450 each, Refills: 3    lisinopril-hydrochlorothiazide (PRINZIDE,ZESTORETIC) 10-12.5 MG tablet TAKE 1 TABLET DAILY Qty: 90 tablet, Refills: 1    Misc. Devices MISC IV 3000 infusion set cover.  Change every 3 days Qty: 30 each, Refills: 3    Multiple Vitamins-Minerals (MULTIVITAMIN,TX-MINERALS) tablet Take 1 tablet by mouth daily.       !! - Potential duplicate medications found. Please discuss with provider.    STOP taking these medications     ezetimibe (ZETIA) 10 MG tablet      rosuvastatin (CRESTOR) 40 MG tablet        No Known Allergies    The results of significant diagnostics from this hospitalization (including imaging, microbiology, ancillary and laboratory) are listed below for reference.    Significant Diagnostic Studies: Dg Chest 2 View  Result Date: 12/29/2016 CLINICAL DATA:  Shortness of breath EXAM: CHEST  2 VIEW COMPARISON:  Chest radiograph 04/22/2009 FINDINGS: The heart size and mediastinal contours are within normal limits. Both lungs are clear. The visualized skeletal structures are unremarkable. IMPRESSION: No active cardiopulmonary disease. Electronically Signed   By: Ulyses Jarred M.D.   On: 12/29/2016 22:54   Ct Head Wo Contrast  Result Date: 12/30/2016 CLINICAL DATA:  Subacute neurologic deficits weakness in both extremities. History of subacute inflammatory poly radiculopathy. Shortness of breath for 1 day. EXAM: CT HEAD WITHOUT CONTRAST  TECHNIQUE: Contiguous axial images were obtained from the base of the skull through the vertex without intravenous contrast. COMPARISON:  None available FINDINGS: Brain: No evidence of acute infarction, hemorrhage, hydrocephalus, extra-axial collection or mass lesion/mass effect. Vascular: Retro calcification.  No hyperdense vessel. Skull: Right parietal scalp soft tissue density without subjacent bony change, morphology and reformats favoring scar. No osseous abnormality. Sinuses/Orbits: Negative IMPRESSION: No explanation for weakness. Electronically Signed   By: Monte Fantasia M.D.   On: 12/30/2016 11:02   Ct Chest W Contrast  Result Date: 01/01/2017 CLINICAL DATA:  Lower abdominal pain, bilateral lower extremity numbness. EXAM: CT CHEST, ABDOMEN, AND PELVIS WITH CONTRAST TECHNIQUE: Multidetector CT imaging of the chest, abdomen and pelvis was performed following the standard protocol during bolus administration of intravenous contrast. CONTRAST:  130m ISOVUE-300 IOPAMIDOL (ISOVUE-300) INJECTION 61% COMPARISON:  Chest CT dated 04/22/2009. FINDINGS: CT CHEST FINDINGS Cardiovascular: Heart size is normal. No pericardial effusion. No thoracic aortic aneurysm or dissection. Coronary artery calcifications noted. Mediastinum/Nodes: Esophagus is unremarkable. No mass or enlarged lymph nodes seen within the mediastinum or perihilar  regions. Trachea and central bronchi are unremarkable. Lungs/Pleura: Small benign calcified granuloma within the right upper lobe. Mild chronic scarring/atelectasis within the lingula. No evidence of acute pulmonary abnormality. No pulmonary nodule or mass. No pleural effusion or pneumothorax. Musculoskeletal: Multiple old healed left-sided rib fractures. Mild degenerative spurring within the thoracic spine. No acute or suspicious osseous finding. CT ABDOMEN PELVIS FINDINGS Hepatobiliary: No focal liver abnormality is seen. No gallstones, gallbladder wall thickening, or biliary  dilatation. Pancreas: Atrophic but otherwise unremarkable. Spleen: Normal in size without focal abnormality. Adrenals/Urinary Tract: Adrenal glands appear normal. Kidneys are unremarkable without mass, stone or hydronephrosis. No ureteral or bladder calculi identified. Bladder walls are thickened circumferentially, possibly accentuated by the bladder decompression. Foley catheter in place. Prostate gland is enlarged causing mass effect on the bladder base. Stomach/Bowel: No dilated large or small bowel loops. No bowel wall thickening or evidence of bowel wall inflammation. Fairly large amount of stool throughout the nondistended colon. Appendix is not convincingly seen but there are no inflammatory changes about the cecum to suggest acute appendicitis. Stomach appears normal. Vascular/Lymphatic: Mild aortic atherosclerosis. No acute appearing vascular abnormality. No enlarged lymph nodes seen in the abdomen or pelvis. Reproductive: Prostate is enlarged. Other: No free fluid or abscess collection. No free intraperitoneal air. Musculoskeletal: Mild degenerative change in the lumbar spine. No acute or suspicious osseous finding. Small bilateral inguinal hernias which contain fat only. Superficial soft tissues are otherwise unremarkable. IMPRESSION: 1. No evidence of acute abnormality within the chest, abdomen or pelvis. Fairly large amount of stool throughout the nondistended colon (constipation? ). 2. Bladder walls are thickened circumferentially, likely accentuated to some degree by the bladder decompression (Foley catheter in place), favor chronic neurogenic bladder related to underlying prostate hypertrophy. 3. Prostate gland is enlarged causing mass effect on the bladder base. Consider correlation with PSA lab values. Consider prostate MRI to exclude underlying malignancy. 4. Otherwise, no evidence of neoplastic process within the chest, abdomen or pelvis. 5. Aortic atherosclerosis. Electronically Signed   By:  Franki Cabot M.D.   On: 01/01/2017 17:10   Mr Brain Wo Contrast  Result Date: 12/31/2016 CLINICAL DATA:  Inflammatory polyneuropathy EXAM: MRI HEAD WITHOUT CONTRAST MRI CERVICAL SPINE WITHOUT CONTRAST TECHNIQUE: Multiplanar, multiecho pulse sequences of the brain and surrounding structures, and cervical spine, to include the craniocervical junction and cervicothoracic junction, were obtained without intravenous contrast. COMPARISON:  Head CT 12/30/2016 FINDINGS: MRI HEAD FINDINGS Brain: The midline structures are normal. There is no acute infarct or acute hemorrhage. No mass lesion, hydrocephalus, dural abnormality or extra-axial collection. The brain parenchymal signal is normal. No age-advanced or lobar predominant atrophy. No chronic microhemorrhage or superficial siderosis. High-resolution T2-weighted imaging was not performed, but there is no visible abnormality of the cranial nerves. Vascular: Major intracranial arterial and venous sinus flow voids are preserved. Skull and upper cervical spine: The visualized skull base, calvarium, upper cervical spine and extracranial soft tissues are normal. Sinuses/Orbits: The paranasal sinuses are clear and there is no mastoid or middle ear effusion. Normal orbits. Other: None. MRI CERVICAL SPINE FINDINGS Alignment: Straightening of the normal cervical lordosis. Vertebrae: No acute compression fracture, discitis-osteomyelitis, facet edema or other focal marrow lesion. No epidural collection. Cord: Normal caliber and signal. Posterior Fossa, vertebral arteries, paraspinal tissues: No prevertebral soft tissue swelling. Normal vertebral and carotid artery flow voids. Disc levels: C1-C2: Normal. C2-C3: Normal disc space and facets. No spinal canal or neuroforaminal stenosis. C3-C4: Central disc protrusion indents the ventral spinal cord without causing signal  change. There is moderate spinal canal stenosis and moderate right neural foraminal stenosis. C4-C5: Medium-sized  disc bulge effaces the ventral thecal sac and deforms the spinal cord, resulting and moderate spinal canal stenosis. No cord signal change. Moderate bilateral foraminal stenosis. C5-C6: Bilateral uncovertebral hypertrophy with small central disc protrusion. There is mild spinal canal stenosis with narrowing of the ventral thecal sac and mild flattening of the anterior spinal cord. There is severe bilateral neural foraminal stenosis. C6-C7: Small disc osteophyte complex without spinal canal stenosis. No neural foraminal stenosis. C7-T1: Normal disc space and facets. No spinal canal or neuroforaminal stenosis. There is no enlargement of the cervical nerve roots. IMPRESSION: 1. No enlargement of the cranial nerves or cervical nerve roots. 2. No acute intracranial abnormality. 3. Multilevel moderate cervical spinal canal stenosis, worst at C3-4 and C5-6 with associated mild cord deformity but no cord signal change. 4. Moderate to severe neural foraminal stenosis at C3-4, C4-5 and C5-6. Electronically Signed   By: Ulyses Jarred M.D.   On: 12/31/2016 22:10   Mr Cervical Spine Wo Contrast  Result Date: 12/31/2016 CLINICAL DATA:  Inflammatory polyneuropathy EXAM: MRI HEAD WITHOUT CONTRAST MRI CERVICAL SPINE WITHOUT CONTRAST TECHNIQUE: Multiplanar, multiecho pulse sequences of the brain and surrounding structures, and cervical spine, to include the craniocervical junction and cervicothoracic junction, were obtained without intravenous contrast. COMPARISON:  Head CT 12/30/2016 FINDINGS: MRI HEAD FINDINGS Brain: The midline structures are normal. There is no acute infarct or acute hemorrhage. No mass lesion, hydrocephalus, dural abnormality or extra-axial collection. The brain parenchymal signal is normal. No age-advanced or lobar predominant atrophy. No chronic microhemorrhage or superficial siderosis. High-resolution T2-weighted imaging was not performed, but there is no visible abnormality of the cranial nerves.  Vascular: Major intracranial arterial and venous sinus flow voids are preserved. Skull and upper cervical spine: The visualized skull base, calvarium, upper cervical spine and extracranial soft tissues are normal. Sinuses/Orbits: The paranasal sinuses are clear and there is no mastoid or middle ear effusion. Normal orbits. Other: None. MRI CERVICAL SPINE FINDINGS Alignment: Straightening of the normal cervical lordosis. Vertebrae: No acute compression fracture, discitis-osteomyelitis, facet edema or other focal marrow lesion. No epidural collection. Cord: Normal caliber and signal. Posterior Fossa, vertebral arteries, paraspinal tissues: No prevertebral soft tissue swelling. Normal vertebral and carotid artery flow voids. Disc levels: C1-C2: Normal. C2-C3: Normal disc space and facets. No spinal canal or neuroforaminal stenosis. C3-C4: Central disc protrusion indents the ventral spinal cord without causing signal change. There is moderate spinal canal stenosis and moderate right neural foraminal stenosis. C4-C5: Medium-sized disc bulge effaces the ventral thecal sac and deforms the spinal cord, resulting and moderate spinal canal stenosis. No cord signal change. Moderate bilateral foraminal stenosis. C5-C6: Bilateral uncovertebral hypertrophy with small central disc protrusion. There is mild spinal canal stenosis with narrowing of the ventral thecal sac and mild flattening of the anterior spinal cord. There is severe bilateral neural foraminal stenosis. C6-C7: Small disc osteophyte complex without spinal canal stenosis. No neural foraminal stenosis. C7-T1: Normal disc space and facets. No spinal canal or neuroforaminal stenosis. There is no enlargement of the cervical nerve roots. IMPRESSION: 1. No enlargement of the cranial nerves or cervical nerve roots. 2. No acute intracranial abnormality. 3. Multilevel moderate cervical spinal canal stenosis, worst at C3-4 and C5-6 with associated mild cord deformity but no  cord signal change. 4. Moderate to severe neural foraminal stenosis at C3-4, C4-5 and C5-6. Electronically Signed   By: Cletus Gash.D.  On: 12/31/2016 22:10   Mr Thoracic Spine W Wo Contrast  Result Date: 12/30/2016 CLINICAL DATA:  Initial evaluation for inflammatory polyneuropathy. EXAM: MRI THORACIC WITHOUT AND WITH CONTRAST TECHNIQUE: Multiplanar and multiecho pulse sequences of the thoracic spine were obtained without and with intravenous contrast. CONTRAST:  34m MULTIHANCE GADOBENATE DIMEGLUMINE 529 MG/ML IV SOLN COMPARISON:  None available. FINDINGS: MRI THORACIC SPINE FINDINGS Alignment: Vertebral bodies normally aligned with preservation of the normal thoracic kyphosis. No listhesis. Vertebrae: Vertebral body heights maintained. No evidence for acute or chronic fracture. Bone marrow signal intensity within normal limits. Prominent hemangioma noted within the right posterior aspect/pedicle of the T8 vertebral body. No other discrete or worrisome osseous lesions. No abnormal edema or enhancement. Cord: Signal intensity within the thoracic spinal cord is normal. No cord signal abnormality or abnormal enhancement. No appreciable nerve root edema or enlargement. No abnormal nerve root enhancement. Paraspinal and other soft tissues: Paraspinous soft tissues within normal limits. Disc levels: T1-2:  Minimal disc bulge.  No stenosis. T2-3: Unremarkable. T3-4:  Unremarkable. T4-5: Shallow right paracentral/ foraminal disc protrusion minimally indents the ventral thecal sac. No stenosis. T5-6: Right paracentral disc protrusion indents the right ventral thecal sac and flattens the right hemi cord (series 7, image 17). No cord signal changes. No significant canal stenosis. Mild right foraminal narrowing. T6-7: Small right central disc protrusion indents the ventral thecal sac and flattens the right hemi cord (series 7, image 20). No cord signal changes or significant stenosis. T7-8: Right paracentral disc  protrusion flattens the right hemi cord without cord signal changes (series 7, image 25). Slight inferior migration of disc material (series 4, image 7). Central canal remains widely patent. No foraminal encroachment. T8-9: Small central disc protrusion abuts and mildly flattens the ventral cord without cord signal changes (series 7, image 30). No significant stenosis. T9-10: Left paracentral disc protrusion flattens the left hemi cord without cord signal changes (series 7, image 33). No significant stenosis. T10-11:  Mild facet hypertrophy.  No stenosis. T11-12:  Unremarkable. T12-L1:  Mild facet hypertrophy.  No stenosis. IMPRESSION: 1. No acute abnormality within the thoracic spine. No abnormal enhancement or cord signal abnormality. 2. Multifocal disc protrusions with secondary cord flattening at T5-6, T6-7, T7-8, and T8-9, and T9-10. No significant stenosis within the thoracic spine. Electronically Signed   By: BJeannine BogaM.D.   On: 12/30/2016 06:17   Ct Abdomen Pelvis W Contrast  Result Date: 01/01/2017 CLINICAL DATA:  Lower abdominal pain, bilateral lower extremity numbness. EXAM: CT CHEST, ABDOMEN, AND PELVIS WITH CONTRAST TECHNIQUE: Multidetector CT imaging of the chest, abdomen and pelvis was performed following the standard protocol during bolus administration of intravenous contrast. CONTRAST:  1049mISOVUE-300 IOPAMIDOL (ISOVUE-300) INJECTION 61% COMPARISON:  Chest CT dated 04/22/2009. FINDINGS: CT CHEST FINDINGS Cardiovascular: Heart size is normal. No pericardial effusion. No thoracic aortic aneurysm or dissection. Coronary artery calcifications noted. Mediastinum/Nodes: Esophagus is unremarkable. No mass or enlarged lymph nodes seen within the mediastinum or perihilar regions. Trachea and central bronchi are unremarkable. Lungs/Pleura: Small benign calcified granuloma within the right upper lobe. Mild chronic scarring/atelectasis within the lingula. No evidence of acute pulmonary  abnormality. No pulmonary nodule or mass. No pleural effusion or pneumothorax. Musculoskeletal: Multiple old healed left-sided rib fractures. Mild degenerative spurring within the thoracic spine. No acute or suspicious osseous finding. CT ABDOMEN PELVIS FINDINGS Hepatobiliary: No focal liver abnormality is seen. No gallstones, gallbladder wall thickening, or biliary dilatation. Pancreas: Atrophic but otherwise unremarkable. Spleen: Normal in size without  focal abnormality. Adrenals/Urinary Tract: Adrenal glands appear normal. Kidneys are unremarkable without mass, stone or hydronephrosis. No ureteral or bladder calculi identified. Bladder walls are thickened circumferentially, possibly accentuated by the bladder decompression. Foley catheter in place. Prostate gland is enlarged causing mass effect on the bladder base. Stomach/Bowel: No dilated large or small bowel loops. No bowel wall thickening or evidence of bowel wall inflammation. Fairly large amount of stool throughout the nondistended colon. Appendix is not convincingly seen but there are no inflammatory changes about the cecum to suggest acute appendicitis. Stomach appears normal. Vascular/Lymphatic: Mild aortic atherosclerosis. No acute appearing vascular abnormality. No enlarged lymph nodes seen in the abdomen or pelvis. Reproductive: Prostate is enlarged. Other: No free fluid or abscess collection. No free intraperitoneal air. Musculoskeletal: Mild degenerative change in the lumbar spine. No acute or suspicious osseous finding. Small bilateral inguinal hernias which contain fat only. Superficial soft tissues are otherwise unremarkable. IMPRESSION: 1. No evidence of acute abnormality within the chest, abdomen or pelvis. Fairly large amount of stool throughout the nondistended colon (constipation? ). 2. Bladder walls are thickened circumferentially, likely accentuated to some degree by the bladder decompression (Foley catheter in place), favor chronic  neurogenic bladder related to underlying prostate hypertrophy. 3. Prostate gland is enlarged causing mass effect on the bladder base. Consider correlation with PSA lab values. Consider prostate MRI to exclude underlying malignancy. 4. Otherwise, no evidence of neoplastic process within the chest, abdomen or pelvis. 5. Aortic atherosclerosis. Electronically Signed   By: Franki Cabot M.D.   On: 01/01/2017 17:10   Dg Chest Port 1 View  Result Date: 01/01/2017 CLINICAL DATA:  Central line placement. EXAM: PORTABLE CHEST 1 VIEW COMPARISON:  Chest x-ray dated 12/29/2016. FINDINGS: Right IJ central line appears adequately positioned at the level of the upper SVC. No pneumothorax. Heart size and mediastinal contours are stable. IMPRESSION: Right IJ central line in place with tip at the level of the upper SVC. No pneumothorax seen. Electronically Signed   By: Franki Cabot M.D.   On: 01/01/2017 16:57   Dg Fluoro Guide Lumbar Puncture  Result Date: 12/31/2016 CLINICAL DATA:  Neuropathy EXAM: DIAGNOSTIC LUMBAR PUNCTURE UNDER FLUOROSCOPIC GUIDANCE FLUOROSCOPY TIME:  Fluoroscopy Time:  0.5 minute Radiation Exposure Index (if provided by the fluoroscopic device): 3.2 mGy Number of Acquired Spot Images: 0 PROCEDURE: Informed consent was obtained from the patient prior to the procedure, including potential complications of headache, allergy, and pain. With the patient prone, the lower back was prepped with Betadine. 1% Lidocaine was used for local anesthesia. Lumbar puncture was performed at the L4-5 level using a 22 gauge needle with return of clear CSF. 10 ml of CSF were obtained for laboratory studies. The patient tolerated the procedure well and there were no apparent complications. IMPRESSION: Successful fluoroscopic guided lumbar puncture. Electronically Signed   By: Kathreen Devoid   On: 12/31/2016 14:11    Microbiology: No results found for this or any previous visit (from the past 240 hour(s)).   Labs: Basic  Metabolic Panel: Recent Labs  Lab 01/05/17 0640 01/05/17 0912 01/07/17 0445 01/07/17 1105 01/09/17 0928 01/09/17 0955  NA 140 141 136 139 140 136  K 4.0 3.9 3.8 4.1 3.9 3.9  CL 106 100* 105 101 101 104  CO2 28  --  26  --   --  26  GLUCOSE 118* 115* 291* 240* 126* 127*  BUN _0 CREATININE 0.73 0.70 0.76 0.70 0.70 0.69  CALCIUM 9.0  --  8.4*  --   --  8.7*  PHOS  --   --   --   --   --  3.4   Liver Function Tests: Recent Labs  Lab 01/05/17 0640 01/09/17 0955  AST 21  --   ALT 17  --   ALKPHOS 85  --   BILITOT 0.7  --   PROT 5.4*  --   ALBUMIN 4.0 3.9   No results for input(s): LIPASE, AMYLASE in the last 168 hours. No results for input(s): AMMONIA in the last 168 hours. CBC: Recent Labs  Lab 01/05/17 0640 01/05/17 0912 01/07/17 0445 01/07/17 1105 01/09/17 0928 01/09/17 0955  WBC 8.7  --  8.6  --   --  8.3  NEUTROABS  --   --   --   --   --  6.2  HGB 15.8 14.6 15.1 14.6 14.6 15.0  HCT 44.8 43.0 42.8 43.0 43.0 42.9  MCV 87.5  --  87.3  --   --  88.3  PLT 107*  --  112*  --   --  152   Cardiac Enzymes: Recent Labs  Lab 01/05/17 1351  CKTOTAL 55   BNP: BNP (last 3 results) No results for input(s): BNP in the last 8760 hours.  ProBNP (last 3 results) No results for input(s): PROBNP in the last 8760 hours.  CBG: Recent Labs  Lab 01/10/17 2112 01/11/17 0629 01/11/17 0655 01/11/17 0723 01/11/17 1155  GLUCAP 100* 49* 41* 102* 184*     Signed:  Velvet Bathe MD.  [facility] 01/11/2017, 3:38 PM

## 2017-01-11 NOTE — Care Management Note (Signed)
Case Management Note  Patient Details  Name: Kenneth Pace MRN: [id number] Date of Birth: Jan 20, 1951  Subjective/Objective:                    Action/Plan: Pt discharging to CIR today. No further needs per CM.  Expected Discharge Date:  01/11/17               Expected Discharge Plan:  IP Rehab Facility  In-House Referral:  Clinical Social Work  Discharge planning Services  CM Consult  Post Acute Care Choice:    Choice offered to:     DME Arranged:    DME Agency:     HH Arranged:    [place] Agency:     Status of Service:  Completed, signed off  If discussed at [employer], dates discussed:    Additional Comments:  Pollie Friar, RN 01/11/2017, 4:22 PM

## 2017-01-11 NOTE — H&P (Signed)
Physical Medicine and Rehabilitation Admission H&P    Chief Complaint  Patient presents with  . 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    HPI:  Kenneth Pace is a 66 y.o. 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  History taken from chart review and patient. Patient recently returned from cruise --used electric scooter to get around. He was readmitted on 12/29/16 with reports of progressive decline that started during his trip with tightness around abdomen with difficulty breathing, progressive difficulty walking with numbness,  difficulty swallowing and inability to void. 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  Dr. Rory Percy recommended evaluation for polyradiculoneuropathy v/s transverse myelitis--doubt GBS. MRI thoracic spine revealed multifocal disc protrusions with cord flattening T5-T10 without significant stenosis. 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    Work up underway to rule out paraneoplastic panel.  LP done yesterday revealing elevated protein 121 and WBC - 7. MRI brain reviewed, unremarkable for acute process.  Per report brain/cervical spine MRI repeated and was negative for acute intracranial abnormality and multilevel moderate stenosis C3/4 and C5/6 with mild cord deformity but no signal changes. CT abdomen/pelvis done to rule out paraneoplastic syndrome and was negative for acute abnormality. Incidental findings of fairly large amount of stool in colon,  thickened bladder wall --favor chronic neurogenic bladder and enlarged prostate causing mass effect on bladder.    Dr. Kathyrn Sheriff consulted and recommended trial of plasma exchange as symptoms favor AIDP but could have component of symptomatic cervical myelopathy. If he does not show improvement to  consider decompression of cervical spondylosis. He completed 5 rounds of treatment and is showing some improvement. NIF stable and tends to have clawing right hand with fatigue.     Review of Systems  HENT: Negative for hearing loss and tinnitus.   Eyes: Negative for blurred vision and double vision.  Respiratory: Negative for cough.   Cardiovascular: Negative for chest pain, palpitations and leg swelling.  Gastrointestinal: Negative for constipation, heartburn and nausea.  Genitourinary: Negative for dysuria and urgency.       Hesitancy since foley d/c but feels that he is voiding without difficulty.   Musculoskeletal: Negative for back pain, joint pain and myalgias.  Skin: Negative for itching and rash.  Neurological: Positive for tingling, sensory change, focal weakness and weakness. Negative for dizziness.  Psychiatric/Behavioral: Negative for memory loss. The patient has insomnia.   All other systems reviewed and are negative.     Past Medical History:  Diagnosis Date  . ADENOIDECTOMY, HX OF 02/16/2007  . ANXIETY 02/16/2007  . BENIGN PROSTATIC HYPERTROPHY, WITH OBSTRUCTION 02/03/2010  . DIABETES MELLITUS, TYPE I, UNCONTROLLED 12/29/2006  . ERECTILE DYSFUNCTION 02/16/2007  . HYPERLIPIDEMIA 02/16/2007  . HYPERTENSION 02/16/2007  . TESTICULAR MASS, LEFT 02/16/2007    Past Surgical History:  Procedure Laterality Date  . KNEE SURGERY    . KNEE SURGERY Right 11/12/2016  . TONSILLECTOMY      Family History  Problem Relation Age of Onset  . Dementia Mother   . Diabetes Mellitus I Mother   . Hypertension Father        58  . Healthy Sister   . Rheum arthritis Brother   . 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 He was working for [employer] until  knee arthroscopy in August. He has been using walker/scooter to get around since October. He  reports that  has never smoked. he has never used smokeless tobacco. He reports that he  does not drink alcohol or use drugs  Allergies: No Known Allergies    Medications Prior to Admission  Medication Sig Dispense Refill  . acetaminophen (TYLENOL) 325 MG tablet Take 2 tablets (650 mg total) by mouth every 6 (six) hours as needed for mild pain or headache.    . busPIRone (BUSPAR) 30 MG tablet TAKE ONE-HALF (1/2) TABLET (15 MG) TWICE A DAY (Patient taking differently: 30MG BY MOUTH AT BEDTIME) 180 tablet 1  . co-enzyme Q-10 30 MG capsule Take 30 mg by mouth daily.      . Continuous Glucose Monitor Sup (ENLITE GLUCOSE SENSOR) MISC 1 Device by Does not apply route every 3 (three) days. 30 each 3  . Cranberry 400 MG TABS Take 2 each by mouth daily.      . Cyanocobalamin (VITAMIN B-12 CR PO) Take 1 each by mouth daily.      Marland Kitchen ezetimibe (ZETIA) 10 MG tablet TAKE 1 TABLET DAILY 90 tablet 3  . finasteride (PROSCAR) 5 MG tablet Take 5 mg by mouth daily.     Marland Kitchen glucagon 1 MG injection Inject 1 mg into the vein once as needed. (Patient taking differently: Inject 1 mg once as needed into the vein (severe hypoglycemia). ) 1 each 12  . glucose blood (BAYER CONTOUR NEXT TEST) test strip Use to check blood sugar 5 times per day dx 250.01, 500 each 3  . HUMALOG 100 UNIT/ML injection INJECT 120 UNITS DAILY IN PUMP 100 mL 11  . Insulin Infusion Pump Supplies (PARADIGM RESERVOIR 3ML) MISC 1 Device by Does not apply route every 3 (three) days. 30 each 3  . Insulin Infusion Pump Supplies (QUICK-SET INFUSION 43" 9MM) MISC 1 Device by Does not apply route every 3 (three) days. 30 each 3  . Lancets Misc. (ACCU-CHEK MULTICLIX LANCET DEV) KIT 1 Device by Other route 5 (five) times daily as needed. 5/day dx 250.01 450 each 3  . lisinopril-hydrochlorothiazide (PRINZIDE,ZESTORETIC) 10-12.5 MG tablet TAKE 1 TABLET DAILY 90 tablet 1  . Misc. Devices MISC IV 3000 infusion set cover.  Change every 3 days 30 each 3  . Multiple Vitamins-Minerals (MULTIVITAMIN,TX-MINERALS) tablet Take 1 tablet by mouth daily.      .  rosuvastatin (CRESTOR) 40 MG tablet TAKE 1 TABLET DAILY 90 tablet 3    Drug Regimen Review  Drug regimen was reviewed and remains appropriate with no significant issues identified  Home: Home Living Family/patient expects to be discharged to:: Inpatient rehab Living Arrangements: Spouse/significant other Available Help at Discharge: Family, Available 24 hours/day Type of Home: House Home Access: Stairs to enter [employer] of Steps: 3 Entrance Stairs-Rails: None Home Layout: Two level, Bed/bath upstairs, Able to live on main level with bedroom/bathroom Bathroom Shower/Tub: Chiropodist: Standard Home Equipment: [place] - single point, Crutches, Environmental consultant - 2 wheels, Walker - 4 wheels, Electric scooter Additional Comments: Reports he has not been going upstairs as it has become too difficult.    Functional History: Prior Function Level of Independence: Needs assistance Gait / Transfers Assistance Needed: Reports he has been requiring increased assist for gait with RW and has had a fall secondary to weakness.  ADL's / Homemaking Assistance Needed: Has been sponge bathing with assist from his wife. Reports increasing difficulty with dressing.   Functional Status:  Mobility: Bed Mobility Overal bed mobility: Needs Assistance Bed Mobility: Rolling, Sidelying to Sit Rolling: Min guard Sidelying to sit: Min assist Supine to sit: Min assist Sit to supine: Min guard General bed mobility comments: Facilitated core activation prior to reaching with UE.  Transfers Overall transfer level: Needs assistance Equipment used: Rolling walker (2 wheeled) Transfers: Sit to/from Stand Sit to Stand: Min assist Stand pivot transfers: Min assist, Mod assist, +2 physical assistance General transfer comment: Min assist to power up with RW. Uses B UE for compensation.  Ambulation/Gait Ambulation/Gait assistance: Mod assist Ambulation Distance (Feet): 25 Feet Assistive  device: 1 person hand held assist, Rolling walker (2 wheeled) Gait Pattern/deviations: Step-through pattern, Decreased stride length, Decreased dorsiflexion - right, Decreased dorsiflexion - left, Narrow base of support General Gait Details: Pt with external rotation of hips and knees hyperextending bilaterally to provide compensatory stability. Amb 5' without walker. With walker and pt bracing UE's pt able to amb 25'. Gait velocity: Decreased Gait velocity interpretation: <1.8 ft/sec, indicative of risk for recurrent falls    ADL: ADL Overall ADL's : Needs assistance/impaired Eating/Feeding: Bed level, Supervision/ safety Eating/Feeding Details (indicate cue type and reason): with built-up handles Grooming: Minimal assistance, Wash/dry hands, Wash/dry face, Oral care, Standing Grooming Details (indicate cue type and reason): difficulty opening toothpaste and to squeeze toothpaste container Upper Body Bathing: Bed level, Moderate assistance Lower Body Bathing: Maximal assistance, Bed level Upper Body Dressing : Bed level, Moderate assistance Lower Body Dressing: Maximal assistance, Bed level Toilet Transfer: Minimal assistance, Ambulation Toilet Transfer Details (indicate cue type and reason): Noted compensatory movements to achieve stability on his feet.  Functional mobility during ADLs: Minimal assistance, Rolling walker General ADL Comments: Continue to note intrinsic minus positioning in R hand especially with fatigue this session.   Cognition: Cognition Overall Cognitive Status: Within Functional Limits for tasks assessed Orientation Level: Oriented X4 Cognition Arousal/Alertness: Awake/alert Behavior During Therapy: WFL for tasks assessed/performed Overall Cognitive Status: Within Functional Limits for tasks assessed General Comments: Stuttering is pt's baseline   Blood pressure 128/63, pulse 77, temperature 98.4 F (36.9 C), temperature source Oral, resp. rate 20, height 6'  (1.829 m), weight 102.4 kg (225 lb 12 oz), SpO2 99 %. Physical Exam  Nursing note and vitals reviewed. Constitutional: He is oriented to person, place, and time. He appears well-developed and well-nourished.  HENT:  Head: Normocephalic and atraumatic.  Mouth/Throat: Oropharynx is clear and moist.  Eyes: Conjunctivae and EOM are normal. Pupils are equal, round, and reactive to light.  Right eye with   Neck: Normal range of motion. Neck supple.  Dressing right neck at prior IJ site.   Cardiovascular: Normal rate and regular rhythm.  Respiratory: Effort normal and breath sounds normal. No stridor. No respiratory distress.  GI: Soft. Bowel sounds are normal. He exhibits no distension. There is no tenderness.  Musculoskeletal: He exhibits no edema or tenderness.  Neurological: He is alert and oriented to person, place, and time.  Speech clear.  Cognition intact and able to follow commands without difficulty.  Motor: B/l UE: 5/5 proximal to distal B/l LE: HF 2+/5, KE 3-/5, ADF/PF 4-/5 Sensation diminished to light touch distal to knees and elbows  Skin: Skin is warm and dry.  Psychiatric: He has a normal mood and affect. His behavior is normal. Judgment and thought content normal.    Results for orders placed or performed during the hospital encounter of 12/29/16 (from the past 48 hour(s))  Glucose, capillary  Status: Abnormal   Collection Time: 01/09/17 12:23 PM  Result Value Ref Range   Glucose-Capillary 107 (H) 65 - 99 mg/dL   Comment 1 Notify RN    Comment 2 Document in Chart   Glucose, capillary     Status: Abnormal   Collection Time: 01/09/17  5:19 PM  Result Value Ref Range   Glucose-Capillary 192 (H) 65 - 99 mg/dL  Glucose, capillary     Status: Abnormal   Collection Time: 01/09/17  9:11 PM  Result Value Ref Range   Glucose-Capillary 159 (H) 65 - 99 mg/dL  Glucose, capillary     Status: Abnormal   Collection Time: 01/10/17  7:06 AM  Result Value Ref Range    Glucose-Capillary 109 (H) 65 - 99 mg/dL  Glucose, capillary     Status: Abnormal   Collection Time: 01/10/17 11:13 AM  Result Value Ref Range   Glucose-Capillary 217 (H) 65 - 99 mg/dL   Comment 1 Notify RN    Comment 2 Document in Chart   Glucose, capillary     Status: Abnormal   Collection Time: 01/10/17  4:23 PM  Result Value Ref Range   Glucose-Capillary 144 (H) 65 - 99 mg/dL   Comment 1 Notify RN    Comment 2 Document in Chart   Glucose, capillary     Status: Abnormal   Collection Time: 01/10/17  9:12 PM  Result Value Ref Range   Glucose-Capillary 100 (H) 65 - 99 mg/dL   Comment 1 Notify RN    Comment 2 Document in Chart   Glucose, capillary     Status: Abnormal   Collection Time: 01/11/17  6:29 AM  Result Value Ref Range   Glucose-Capillary 49 (L) 65 - 99 mg/dL  Glucose, capillary     Status: Abnormal   Collection Time: 01/11/17  6:55 AM  Result Value Ref Range   Glucose-Capillary 41 (LL) 65 - 99 mg/dL  Glucose, capillary     Status: Abnormal   Collection Time: 01/11/17  7:23 AM  Result Value Ref Range   Glucose-Capillary 102 (H) 65 - 99 mg/dL   No results found.     Medical Problem List and Plan: 1.  Weakness, limitations with ADLs secondary to demyelinating myeloneuropathy. ---completed plasmapheresis on 11/24 2.  DVT Prophylaxis/Anticoagulation: Pharmaceutical: Lovenox 3. Pain Management: N/A--right knee feels good 4. Mood: Remains optimistic. LCSW to follow for evaluation and support.  5. Neuropsych: This patient is capable of making decisions on his own behalf. 6. Skin/Wound Care: Routine pressure relief measures. Maintain adequate nutritional and hydration status.  7. Fluids/Electrolytes/Nutrition: Monitor I/O. Check lytes in am. Will add national supplement to help promote healing and for BS stabilization.  8. Demyelinating polyneuropathy: 9.T2DM: Hgb A1c- 7.3 (managed by Dr. Loanne Drilling) Monitor BS ac/hs. Has insulin pump with monitor--BS dropped to 30's this am  (needed sensor changed).  Anticipate high blood sugars for next 24 hours. He is adjusting basal rate and adjusts  boluses for meal coverage.  10. BPH/Neurogenic bladder: On finasteride in am. Will check PVR X 3.  11. HTN: Monitor BP bid. Continue lisiopril 12. Anxiety disorder: Due to work stress and has been managed with buspar.  13. Constipation: Resolved with bowel regimen of miralax bid.     Post Admission Physician Evaluation: 1. Preadmission assessment reviewed and changes made below. 2. Functional deficits secondary  to demyelinating myeloneuropathy. 3. Patient is admitted to receive collaborative, interdisciplinary care between the physiatrist, rehab nursing staff, and therapy team. 4. Patient's  level of medical complexity and substantial therapy needs in context of that medical necessity cannot be provided at a lesser intensity of care such as a SNF. 5. Patient has experienced substantial functional loss from his/her baseline which was documented above under the "Functional History" and "Functional Status" headings.  Judging by the patient's diagnosis, physical exam, and functional history, the patient has potential for functional progress which will result in measurable gains while on inpatient rehab.  These gains will be of substantial and practical use upon discharge  in facilitating mobility and self-care at the household level. 34. Physiatrist will provide 24 hour management of medical needs as well as oversight of the therapy plan/treatment and provide guidance as appropriate regarding the interaction of the two. 7. 24 hour rehab nursing will assist with safety, disease management and patient education  and help integrate therapy concepts, techniques,education, etc. 8. PT will assess and treat for/with: Lower extremity strength, range of motion, stamina, balance, functional mobility, safety, adaptive techniques and equipment, coping skills, pain control, education.   Goals are:  Supervision/Mod I. 9. OT will assess and treat for/with: ADL's, functional mobility, safety, upper extremity strength, adaptive techniques and equipment, ego support, and community reintegration.   Goals are: Mod I. Therapy may proceed with showering this patient. 10. Case Management and Social Worker will assess and treat for psychological issues and discharge planning. 11. Team conference will be held weekly to assess progress toward goals and to determine barriers to discharge. 12. Patient will receive at least 3 hours of therapy per day at least 5 days per week. 13. ELOS: 10-14 days.       14. Prognosis:  good  Delice Lesch, MD, ABP[facility] [place], [place] 01/11/2017

## 2017-01-12 ENCOUNTER — Inpatient Hospital Stay (HOSPITAL_COMMUNITY): Payer: BLUE CROSS/BLUE SHIELD

## 2017-01-12 ENCOUNTER — Inpatient Hospital Stay (HOSPITAL_COMMUNITY): Payer: BLUE CROSS/BLUE SHIELD | Admitting: Physical Therapy

## 2017-01-12 ENCOUNTER — Inpatient Hospital Stay (HOSPITAL_COMMUNITY): Payer: BLUE CROSS/BLUE SHIELD | Admitting: Occupational Therapy

## 2017-01-12 DIAGNOSIS — N400 Enlarged prostate without lower urinary tract symptoms: Secondary | ICD-10-CM

## 2017-01-12 DIAGNOSIS — I10 Essential (primary) hypertension: Secondary | ICD-10-CM

## 2017-01-12 DIAGNOSIS — F411 Generalized anxiety disorder: Secondary | ICD-10-CM

## 2017-01-12 DIAGNOSIS — G378 Other specified demyelinating diseases of central nervous system: Secondary | ICD-10-CM

## 2017-01-12 DIAGNOSIS — E1142 Type 2 diabetes mellitus with diabetic polyneuropathy: Secondary | ICD-10-CM

## 2017-01-12 LAB — GLUCOSE, CAPILLARY
GLUCOSE-CAPILLARY: 42 mg/dL — AB (ref 65–99)
GLUCOSE-CAPILLARY: 89 mg/dL (ref 65–99)
GLUCOSE-CAPILLARY: 253 mg/dL — AB (ref 65–99)
Glucose-Capillary: 308 mg/dL — ABNORMAL HIGH (ref 65–99)
Glucose-Capillary: 118 mg/dL — ABNORMAL HIGH (ref 65–99)

## 2017-01-12 LAB — COMPREHENSIVE METABOLIC PANEL
ALBUMIN: 4.1 g/dL (ref 3.5–5.0)
ALK PHOS: 91 U/L (ref 38–126)
ALT: 24 U/L (ref 17–63)
ANION GAP: 7 (ref 5–15)
AST: 25 U/L (ref 15–41)
BILIRUBIN TOTAL: 0.8 mg/dL (ref 0.3–1.2)
BUN: 11 mg/dL (ref 6–20)
CALCIUM: 8.9 mg/dL (ref 8.9–10.3)
CO2: 26 mmol/L (ref 22–32)
Chloride: 106 mmol/L (ref 101–111)
Creatinine, Ser: 0.75 mg/dL (ref 0.61–1.24)
GFR calc non Af Amer: >60 mL/min (ref >60)
GLUCOSE: 50 mg/dL — AB (ref 65–99)
POTASSIUM: 3.5 mmol/L (ref 3.5–5.1)
Sodium: 139 mmol/L (ref 135–145)
TOTAL PROTEIN: 5.8 g/dL — AB (ref 6.5–8.1)

## 2017-01-12 LAB — CBC WITH DIFFERENTIAL/PLATELET
BASOS PCT: 0 %
Basophils Absolute: 0 10*3/uL (ref 0.0–0.1)
EOS ABS: 0.3 10*3/uL (ref 0.0–0.7)
Eosinophils Relative: 3 %
HEMATOCRIT: 44.5 % (ref 39.0–52.0)
Hemoglobin: 15.4 g/dL (ref 13.0–17.0)
LYMPHS ABS: 1.7 10*3/uL (ref 0.7–4.0)
Lymphocytes Relative: 20 %
MCH: 30.4 pg (ref 26.0–34.0)
MCHC: 34.6 g/dL (ref 30.0–36.0)
MCV: 87.9 fL (ref 78.0–100.0)
MONO ABS: 0.7 10*3/uL (ref 0.1–1.0)
MONOS PCT: 8 %
NEUTROS ABS: 6 10*3/uL (ref 1.7–7.7)
Neutrophils Relative %: 69 %
Platelets: 189 10*3/uL (ref 150–400)
RBC: 5.06 MIL/uL (ref 4.22–5.81)
RDW: 13.5 % (ref 11.5–15.5)
WBC: 8.7 10*3/uL (ref 4.0–10.5)

## 2017-01-12 MED ORDER — INSULIN ASPART 100 UNIT/ML ~~LOC~~ SOLN
100.0000 [IU] | Freq: Once | SUBCUTANEOUS | Status: AC
  Administered 2017-01-12: 100 [IU] via SUBCUTANEOUS
  Filled 2017-01-12: qty 1

## 2017-01-12 MED ORDER — CYCLOBENZAPRINE HCL 5 MG PO TABS
5.0000 mg | ORAL_TABLET | Freq: Three times a day (TID) | ORAL | Status: DC | PRN
  Administered 2017-01-12 – 2017-01-14 (×5): 5 mg via ORAL
  Filled 2017-01-12 (×5): qty 1

## 2017-01-12 NOTE — Significant Event (Signed)
Hypoglycemic Event  CBG: 42  Treatment: 15 GM carbohydrate snack  Symptoms: None  Follow-up CBG: Time:0700CBG Result:89  Possible Reasons for Event: Unknown  Comments/MD notifiedyes    Etheleen Nicks

## 2017-01-12 NOTE — Evaluation (Signed)
Occupational Therapy Assessment and Plan  Patient Details  Name: Kenneth Pace MRN: [id number] Date of Birth: 09/03/1950  OT Diagnosis: ataxia, muscle weakness (generalized) and quadriparesis  Rehab Potential: Rehab Potential (ACUTE ONLY): Excellent ELOS: 12-14 days   Today's Date: 01/12/2017 OT Individual Time: 1000-1100 OT Individual Time Calculation (min): 60 min     Problem List:  Patient Active Problem List   Diagnosis Date Noted  . Type 2 diabetes mellitus with peripheral neuropathy (HCC)   . Generalized anxiety disorder   . 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 Anxiety state   . Neurogenic bladder   . Depression 12/30/2016  . Leg weakness, bilateral 12/30/2016  . 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 GBS (Guillain Barre syndrome) ([place])   . AIDP (acute inflammatory demyelinating polyneuropathy) ([place]) 11/27/2016  . Weakness 11/20/2016  . Weakness of both arms 10/30/2016  . Numbness 03/18/2016  . Diabetes mellitus without complication ([place]) 32/20/2542  . Eustachian tube dysfunction 02/12/2015  . Acute maxillary sinusitis 02/12/2015  . Wellness examination 08/22/2014  . Alkaline phosphatase elevation 08/22/2014  . Screening for prostate cancer 04/24/2013  . Routine general medical examination at a health care facility 04/10/2012  . BPH (benign prostatic hyperplasia) 02/03/2010  . HEMOCCULT POSITIVE STOOL 05/25/2008  . ELBOW PAIN, RIGHT 07/04/2007  . Dyslipidemia 02/16/2007  . ANXIETY 02/16/2007  . ERECTILE DYSFUNCTION 02/16/2007  . Essential hypertension 02/16/2007  . TESTICULAR MASS, LEFT 02/16/2007  . KNEE PAIN, CHRONIC 02/16/2007  . ADENOIDECTOMY, HX OF 02/16/2007    Past Medical History:  Past Medical History:  Diagnosis Date  . ADENOIDECTOMY, HX OF 02/16/2007  . Anxiety    pt. states he does not have anxiety.  Marland Kitchen BENIGN PROSTATIC HYPERTROPHY, WITH OBSTRUCTION 02/03/2010  .  DIABETES MELLITUS, TYPE I, UNCONTROLLED 12/29/2006  . ERECTILE DYSFUNCTION 02/16/2007  . HYPERLIPIDEMIA 02/16/2007  . HYPERTENSION 02/16/2007  . TESTICULAR MASS, LEFT 02/16/2007   Past Surgical History:  Past Surgical History:  Procedure Laterality Date  . KNEE SURGERY     2 Lknee arthroscopy, 3 R knee arthroscpoy  . KNEE SURGERY Right 11/12/2016  . TONSILLECTOMY      Assessment & Plan Clinical Impression:  Kenneth Pace a 66 y.o.malewith history of T2DM, OSA,,subacute inflammatory polyradiculoneuropathy treated withIVIG for lower extremity weakness10/2018 and was attending outpatient PT.History taken from chart review and patient. Patient recently returned from cruise --used Doctor, general practice get around. [name] readmitted on 12/29/16 withreports of progressive decline that started during his trip with tightness around abdomen with difficulty breathing,progressive difficulty walking with numbness, difficulty swallowing and inability to void.Has been evaluated by neurology with evidence of demyelinating neuropathy on EMG. Dr. Rory Percy recommended evaluation for polyradiculoneuropathy v/s transverse myelitis--doubt GBS. MRI thoracic spine revealed multifocal disc protrusions with cord flattening T5-T10 without significant stenosis. 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   Work up underway to rule out paraneoplastic panel.LP done yesterday revealing elevated protein 121 and WBC - 7. MRI brain reviewed, unremarkable for acute process.  Per report brain/cervical spineMRI repeated and was negative foracute intracranial abnormality and multilevel moderate stenosis C3/4 and C5/6 with mild cord deformity but no signal changes. CT abdomen/pelvis done to rule out paraneoplastic syndrome and was negative for acute abnormality. Incidental findings of fairly large amount of stool in colon,  thickened bladder wall --favor chronic neurogenic  bladder and enlarged prostate causing mass effect on bladder.    Dr. Kathyrn Sheriff consulted and recommended trial of plasma exchange as  symptoms favor AIDP but could have component of symptomatic cervical myelopathy. If he does not show improvement to consider decompression of cervical spondylosis. He completed 5 Jeraldin Fesler of treatment and is showing some improvement. NIF stable and tends to have clawing right hand with fatigue.     Patient transferred to CIR on 01/11/2017 .    Patient currently requires max with basic self-care skills secondary to muscle weakness and muscle paralysis, decreased cardiorespiratoy endurance, unbalanced muscle activation, ataxia and decreased coordination and decreased postural control and decreased balance strategies.  Prior to hospitalization, patient could complete ADLs/IADLs with independent .  Patient will benefit from skilled intervention to decrease level of assist with basic self-care skills, increase independence with basic self-care skills and increase level of independence with iADL prior to discharge home with care partner.  Anticipate patient will require intermittent supervision and follow up outpatient.  OT - End of Session Activity Tolerance: Tolerates 10 - 20 min activity with multiple rests Endurance Deficit: Yes OT Assessment Rehab Potential (ACUTE ONLY): Excellent OT Patient demonstrates impairments in the following area(s): Balance;Endurance;Safety;Sensory;Motor OT Basic ADL's Functional Problem(s): Eating;Grooming;Bathing;Dressing;Toileting OT Transfers Functional Problem(s): Toilet;Tub/Shower OT Additional Impairment(s): Fuctional Use of Upper Extremity OT Plan OT Intensity: Minimum of 1-2 x/day, 45 to 90 minutes OT Frequency: 5 out of 7 days OT Duration/Estimated Length of Stay: 12-14 days OT Treatment/Interventions: Medical illustrator training;Community reintegration;Discharge planning;Functional mobility training;Neuromuscular  re-education;Patient/family education;Self Care/advanced ADL retraining;Pain management;Psychosocial support;Skin care/wound managment;Therapeutic Activities;Therapeutic Exercise;UE/LE Strength taining/ROM;UE/LE Coordination activities;Functional electrical stimulation OT Self Feeding Anticipated Outcome(s): Mod I OT Basic Self-Care Anticipated Outcome(s): Mod I OT Toileting Anticipated Outcome(s): Mod I OT Bathroom Transfers Anticipated Outcome(s): Supervision-mod I OT Recommendation Recommendations for Other Services: Therapeutic Recreation consult Therapeutic Recreation Interventions: Pet therapy;Kitchen group;Stress management;Outing/community reintergration Patient destination: Home Follow Up Recommendations: Outpatient OT Equipment Recommended: To be determined   Skilled Therapeutic Intervention Pt seen for OT eval and ADL bathing/dressing session. Pt sitting up in w/c upon arrival, ready for tx session. He ambulated throughout session with RW and min A, assist to come into standing position as well. He bathed seated on tub bench, difficulty managing hand held shower head due to weak gross grasp and poor control.  He returned to w/c to dress, requiring increased assist due to poor trunk control and limited grasping abilities. Grooming tasks completed from w/c level at sink with increased time to manage self-care items. Pt left seated in w/c at end of session, all needs in reach.  Education provided throughout session regarding role of OT, POC, decreased sensation and functional implications, OT/PT goals and d/c planning.   OT Evaluation Precautions/Restrictions  Precautions Precautions: Fall Restrictions Weight Bearing Restrictions: No General Chart Reviewed: Yes Vital Signs  Pain Pain Assessment Pain Assessment: No/denies pain Home Living/Prior Functioning Home Living Family/patient expects to be discharged to:: Private residence Living Arrangements: Spouse/significant  other Available Help at Discharge: Family, Available 24 hours/day Type of Home: House Home Access: Stairs to enter [employer] of Steps: 2 Entrance Stairs-Rails: Can reach both, Left, Right Home Layout: Two level, Bed/bath upstairs, Able to live on main level with bedroom/bathroom Alternate Level Stairs-Number of Steps: 17 Alternate Level Stairs-Rails: Can reach both Bathroom Shower/Tub: Tub/shower unit, Architectural technologist: Standard Bathroom Accessibility: Yes Additional Comments: Has been using RW PTA over the past couple of months  Lives With: Spouse IADL History Current License: Yes Mode of Transportation: Car Occupation: Full time employment Type of Occupation: Works for [employer] Prior Function Level of Independence: Independent with basic ADLs,  Independent with homemaking with ambulation, Independent with gait, Independent with transfers  Able to Take Stairs?: Yes Driving: Yes Vocation: Full time employment 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[employer]; also works on cars at [employer] Baseline Vision/History: Wears glasses Wears Glasses: At all times Patient Visual Report: No change from baseline Vision Assessment?: No apparent visual deficits Perception  Perception: Within Functional Limits Praxis Praxis: Intact Cognition Overall Cognitive Status: Within Functional Limits for tasks assessed Arousal/Alertness: Awake/alert Orientation Level: Person;Place;Situation Person: Oriented Place: Oriented Situation: Oriented Year: 2018 Month: November Day of Week: Correct Memory: Appears intact Immediate Memory Recall: Sock;Blue;Bed Memory Recall: Sock;Bed;Blue Memory Recall Sock: With Cue Memory Recall Blue: With Cue Memory Recall Bed: With Cue Awareness: Appears intact Problem Solving: Appears intact Safety/Judgment: Appears intact Sensation Sensation Light Touch: Impaired Detail Light Touch Impaired Details: Impaired RUE;Impaired  LUE;Impaired RLE;Impaired LLE(reports numbness from elbow level distally) Hot/Cold: Appears Intact(Pt reports water in shower feeling the same throughout) Proprioception: Appears Intact Coordination Gross Motor Movements are Fluid and Coordinated: No Fine Motor Movements are Fluid and Coordinated: No Coordination and Movement Description: Ataxic with decreased coordination and weakness Heel Shin Test: unable d/t hip flexor strength deficits Motor  Motor Motor - Skilled Clinical Observations: Core strength deficits/ discoordination; UE distal weakness Mobility  Bed Mobility Bed Mobility: Sit to Supine;Supine to Sit Supine to Sit: 5: Supervision;HOB flat Supine to Sit Details: Verbal cues for precautions/safety;Verbal cues for technique Sit to Supine: 5: Supervision;HOB flat Sit to Supine - Details: Verbal cues for precautions/safety;Verbal cues for technique Transfers Sit to Stand: 3: Mod assist Sit to Stand Details: Verbal cues for precautions/safety;Tactile cues for weight shifting;Tactile cues for posture;Verbal cues for safe use of DME/AE;Tactile cues for initiation Sit to Stand Details (indicate cue type and reason): cues for hand placement on w/c, assist for initiating sit >stand Stand to Sit: 4: Min guard;With armrests  Trunk/Postural Assessment  Cervical Assessment Cervical Assessment: Within Functional Limits Thoracic Assessment Thoracic Assessment: Exceptions to WFL(Kyphotic) Lumbar Assessment Lumbar Assessment: Exceptions to WFL(Posterior pelvic tilt) Postural Control Postural Control: Deficits on evaluation(Poor core control and trunkal stability)  Balance Balance Balance Assessed: Yes Static Sitting Balance Static Sitting - Balance Support: Feet supported Static Sitting - Level of Assistance: 5: Stand by assistance Dynamic Sitting Balance Sitting balance - Comments: Sitting to complete bathing task Static Standing Balance Static Standing - Balance Support: During  functional activity;Right upper extremity supported;Left upper extremity supported Static Standing - Level of Assistance: 4: Min assist;3: Mod assist Static Standing - Comment/# of Minutes: Standing to complete bathing/dressing Dynamic Standing Balance Dynamic Standing - Balance Support: During functional activity;Right upper extremity supported;Left upper extremity supported Dynamic Standing - Level of Assistance: 3: Mod assist Dynamic Standing - Comments: Standing to pull up pants Extremity/Trunk Assessment RUE Assessment RUE Assessment: Exceptions to [facility] RUE Strength RUE Overall Strength: Deficits(4/5 throughout; weak gross grasp) LUE Assessment LUE Assessment: Exceptions to WFL(4/5 throughout; weak gross grasp)   See Function Navigator for Current Functional Status.   Refer to Care Plan for Long Term Goals  Recommendations for other services: Therapeutic Recreation  Pet therapy, Kitchen group, Stress management and Outing/community reintegration   Discharge Criteria: Patient will be discharged from OT if patient refuses treatment 3 consecutive times without medical reason, if treatment goals not met, if there is a change in medical status, if patient makes no progress towards goals or if patient is discharged from hospital.  The above assessment, treatment plan, treatment alternatives and  goals were discussed and mutually agreed upon: by patient  Ernestina Patches 01/12/2017, 12:15 PM

## 2017-01-12 NOTE — Progress Notes (Signed)
Physical Medicine and Rehabilitation Consult   Reason for Consult: Functional deficits in mobility and ADLs Referring Physician: Dr. Thereasa Solo    HPI: Kenneth Pace is a 66 y.o. 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  Patient recently returned from cruise --used electric scooter to get around. He was readmitted on 12/29/16 with reports of progressive decline that started during his trip with tightness around abdomen with difficulty breathing, progressive difficulty walking with numbness,  difficulty swallowing and inability to void. 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  Dr. Rory Percy recommended evaluation for polyradiculoneuropathy v/s transverse myelitis--doubt GBS. MRI thoracic spine revealed multifocal disc protrusions with cord flattening T5-T10 without significant stenosis. Patient with tetraplegia and neuro felt exam due to myeloneuropathy.    Work up underway to rule out paraneoplastic panel.  LP done yesterday revealing elevated protein 121 and WBC - 7. MRI brain/cervical spine repeated and was negative for acute intracranial abnormality and multilevel moderate stenosis C3/4 and C5/6 with mild cord deformity but no signal changes. CT chest/abdomen pending. Wife reports  plans on starting plasmapheresis soon. Therapy evaluations completed today and CIR recommended due to functional deficits.    Review of Systems  HENT: Negative for hearing loss and tinnitus.   Eyes: Negative for blurred vision and double vision.  Respiratory:       Has difficulty breathing.   Cardiovascular: Negative for chest pain and palpitations.  Gastrointestinal: Positive for constipation. Negative for abdominal pain, heartburn and nausea.  Genitourinary: Negative for dysuria and urgency.       Difficulty voiding  Musculoskeletal: Negative  for myalgias.  Skin: Negative for itching and rash.  Neurological: Positive for sensory change, focal weakness, seizures and weakness (has good days and bad days). Negative for dizziness and headaches.  Psychiatric/Behavioral: Negative for hallucinations. The patient is not nervous/anxious and does not have insomnia.         Past Medical History:  Diagnosis Date  . ADENOIDECTOMY, HX OF 02/16/2007  . ANXIETY 02/16/2007  . BENIGN PROSTATIC HYPERTROPHY, WITH OBSTRUCTION 02/03/2010  . DIABETES MELLITUS, TYPE I, UNCONTROLLED 12/29/2006  . ERECTILE DYSFUNCTION 02/16/2007  . HYPERLIPIDEMIA 02/16/2007  . HYPERTENSION 02/16/2007  . TESTICULAR MASS, LEFT 02/16/2007         Past Surgical History:  Procedure Laterality Date  . KNEE SURGERY    . KNEE SURGERY Right 11/12/2016  . TONSILLECTOMY           Family History  Problem Relation Age of Onset  . Dementia Mother   . Diabetes Mellitus I Mother   . Hypertension Father        52  . Healthy Sister   . Rheum arthritis Brother   . 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 He was working for [employer] until knee arthroscopy in August. He has been using walker/scooter to get around since October. He  reports that  has never smoked. he has never used smokeless tobacco. He reports that he does not drink alcohol or use drugs.    Allergies: No Known Allergies          Medications Prior to Admission  Medication Sig Dispense Refill  . acetaminophen (TYLENOL) 325 MG tablet Take 2 tablets (650 mg total) by mouth every 6 (six) hours as needed for mild pain or headache.    Marland Kitchen  busPIRone (BUSPAR) 30 MG tablet TAKE ONE-HALF (1/2) TABLET (15 MG) TWICE A DAY (Patient taking differently: 30MG BY MOUTH AT BEDTIME) 180 tablet 1  . co-enzyme Q-10 30 MG capsule Take 30 mg by mouth daily.      . Continuous Glucose Monitor Sup (ENLITE GLUCOSE SENSOR) MISC 1 Device by Does not apply route  every 3 (three) days. 30 each 3  . Cranberry 400 MG TABS Take 2 each by mouth daily.      . Cyanocobalamin (VITAMIN B-12 CR PO) Take 1 each by mouth daily.      Marland Kitchen ezetimibe (ZETIA) 10 MG tablet TAKE 1 TABLET DAILY 90 tablet 3  . finasteride (PROSCAR) 5 MG tablet Take 5 mg by mouth daily.     Marland Kitchen glucagon 1 MG injection Inject 1 mg into the vein once as needed. (Patient taking differently: Inject 1 mg once as needed into the vein (severe hypoglycemia). ) 1 each 12  . glucose blood (BAYER CONTOUR NEXT TEST) test strip Use to check blood sugar 5 times per day dx 250.01, 500 each 3  . HUMALOG 100 UNIT/ML injection INJECT 120 UNITS DAILY IN PUMP 100 mL 11  . Insulin Infusion Pump Supplies (PARADIGM RESERVOIR 3ML) MISC 1 Device by Does not apply route every 3 (three) days. 30 each 3  . Insulin Infusion Pump Supplies (QUICK-SET INFUSION 43" 9MM) MISC 1 Device by Does not apply route every 3 (three) days. 30 each 3  . Lancets Misc. (ACCU-CHEK MULTICLIX LANCET DEV) KIT 1 Device by Other route 5 (five) times daily as needed. 5/day dx 250.01 450 each 3  . lisinopril-hydrochlorothiazide (PRINZIDE,ZESTORETIC) 10-12.5 MG tablet TAKE 1 TABLET DAILY 90 tablet 1  . Misc. Devices MISC IV 3000 infusion set cover.  Change every 3 days 30 each 3  . Multiple Vitamins-Minerals (MULTIVITAMIN,TX-MINERALS) tablet Take 1 tablet by mouth daily.      . rosuvastatin (CRESTOR) 40 MG tablet TAKE 1 TABLET DAILY 90 tablet 3    Home: Home Living Family/patient expects to be discharged to:: Inpatient rehab Living Arrangements: Spouse/significant other Available Help at Discharge: Family, Available 24 hours/day Type of Home: House Home Access: Stairs to enter [employer] of Steps: 3 Entrance Stairs-Rails: None Home Layout: Two level, Bed/bath upstairs, Able to live on main level with bedroom/bathroom Bathroom Shower/Tub: Chiropodist: Standard Home Equipment: [place] - single point,  Crutches, Environmental consultant - 2 wheels, Walker - 4 wheels, Electric scooter Additional Comments: Reports he has not been going upstairs as it has become too difficult.   Functional History: Prior Function Level of Independence: Needs assistance Gait / Transfers Assistance Needed: Reports he has been requiring increased assist for gait with RW and has had a fall secondary to weakness.  ADL's / Homemaking Assistance Needed: Has been sponge bathing with assist from his wife. Reports increasing difficulty with dressing.  Functional Status:  Mobility: Bed Mobility Overal bed mobility: Needs Assistance Bed Mobility: Supine to Sit, Sit to Supine Supine to sit: Min assist Sit to supine: Min guard General bed mobility comments: MinA for HHA to assist trunk into elevation; no assist needed for returning to supine Transfers Overall transfer level: Needs assistance Equipment used: Rolling walker (2 wheeled) Transfers: Sit to/from Stand Sit to Stand: Min guard, Min assist General transfer comment: Stood from bed with RW minA to assist trunk elevation and cue bilat hip ext. Stood from raised toilet with RW and min guard for balance. Cues for hand placement on RW Ambulation/Gait Ambulation/Gait  assistance: Min guard Ambulation Distance (Feet): 30 Feet Assistive device: Rolling walker (2 wheeled) Gait Pattern/deviations: Step-through pattern, Decreased stride length, Wide base of support, Decreased dorsiflexion - right, Decreased dorsiflexion - left General Gait Details: Amb to/from bathroom with RW and min guard for balance secondary to BLE instability; mild instability with increased steppage due to decreased ankle DF. Further mobility limited secondary to arrival of transport for lumbar puncture.  Gait velocity: Decreased Gait velocity interpretation: <1.8 ft/sec, indicative of risk for recurrent falls  ADL: ADL Overall ADL's : Needs assistance/impaired Eating/Feeding: Bed level, Supervision/  safety Eating/Feeding Details (indicate cue type and reason): with built-up handles Grooming: Supervision/safety, Bed level Grooming Details (indicate cue type and reason): with built-up handles Upper Body Bathing: Bed level, Moderate assistance Lower Body Bathing: Maximal assistance, Bed level Upper Body Dressing : Bed level, Moderate assistance Lower Body Dressing: Maximal assistance, Bed level General ADL Comments: Session focused on self-feeding with built-up handles. Will cotninue to assess mobility.   Cognition: Cognition Overall Cognitive Status: Within Functional Limits for tasks assessed Orientation Level: Oriented X4 Cognition Arousal/Alertness: Awake/alert Behavior During Therapy: WFL for tasks assessed/performed Overall Cognitive Status: Within Functional Limits for tasks assessed  Blood pressure 121/75, pulse 80, temperature (!) 96.7 F (35.9 C), temperature source Axillary, resp. rate 19, height 6' (1.829 m), weight 103.1 kg (227 lb 6.4 oz), SpO2 96 %. Physical Exam  Nursing note and vitals reviewed. Constitutional: He is oriented to person, place, and time. He appears well-developed and well-nourished.  HENT:  Head: Normocephalic and atraumatic.  Mouth/Throat: Oropharynx is clear and moist.  Eyes: Conjunctivae are normal. Pupils are equal, round, and reactive to light.  Neck: Normal range of motion. Neck supple.  Cardiovascular: Normal rate and regular rhythm.  Respiratory: Effort normal and breath sounds normal. No stridor.  GI: Soft. Bowel sounds are normal. He exhibits no distension. There is no tenderness.  Musculoskeletal: He exhibits no edema or tenderness.  Neurological: He is alert and oriented to person, place, and time.  Upper extremity motor exam grossly 4 out of 5 proximal distal.  He does have stocking glove sensory loss from the hands to mid forearms.  He also has sensory loss in the trunk and lower extremities with sensation being most impaired  distally.  Hip flexors are 1+ to 2 out of 5 as are his knee extensors.  Ankle dorsiflexion and plantar flexion is 3 to 3+ out of 5.  Patellar tendon reflexes are 3+.  Reflexes in the upper extremities are 1-2+.  No bulbar signs  Skin: Skin is warm and dry.  Psychiatric: He has a normal mood and affect. His behavior is normal. Judgment and thought content normal.  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 Does the need for close, 24 hr/day medical supervision in concert with the patient's rehab needs make it unreasonable for this patient to be served in a less intensive setting? Yes 2. Co-Morbidities requiring supervision/potential complications: Diabetes, hypertension, neurogenic bowel and bladder 3. Due to bladder management, bowel management, safety, skin/wound care, disease management, medication administration, pain management and patient education, does the patient require 24 hr/day rehab nursing? Yes 4. Does the patient require coordinated care of a physician, rehab nurse, PT (1-2 hrs/day, 5 days/week) and OT (1-2 hrs/day, 5 days/week) to address physical and functional deficits in the context of the above medical diagnosis(es)? Yes Addressing deficits in the following areas: balance, endurance, locomotion, strength, transferring, bowel/bladder  control, bathing, dressing, feeding, grooming, toileting and psychosocial support 5. Can the patient actively participate in an intensive therapy program of at least 3 hrs of therapy per day at least 5 days per week? Yes 6. The potential for patient to make measurable gains while on inpatient rehab is excellent 7. Anticipated functional outcomes upon discharge from inpatient rehab are modified independent and supervision  with PT, modified independent and supervision with OT, n/a with SLP. 8. Estimated rehab length of stay to reach the above  functional goals is: 11-17 days 9. Anticipated D/C setting: Home 10. Anticipated post D/C treatments: HH therapy and Outpatient therapy 11. Overall Rehab/Functional Prognosis: good  RECOMMENDATIONS: This patient's condition is appropriate for continued rehabilitative care in the following setting: CIR Patient has agreed to participate in recommended program. Yes Note that insurance prior authorization may be required for reimbursement for recommended care.  Comment: We will follow along as the diagnostic workup and treatment continues.  Meredith Staggers, MD, [facility] 01/01/2017    Bary Leriche, PA-C 01/01/2017          Revision History                                       Routing History

## 2017-01-12 NOTE — Progress Notes (Addendum)
Physical Therapy Session Note  Patient Details  Name: Kenneth Pace MRN: [id number] Date of Birth: 09-Sep-1950  Today's Date: 01/12/2017 PT Individual Time: 1500-1630 PT Individual Time Calculation (min): 90 min   Short Term Goals: Week 1:  PT Short Term Goal 1 (Week 1): Pt will perform bed mobility with modI PT Short Term Goal 2 (Week 1): Pt will perform stand pivot transfers with consistent S PT Short Term Goal 3 (Week 1): Pt will ambulate 150' with RW and S PT Short Term Goal 4 (Week 1): Pt will ascend/descend four 6" steps with minA  Skilled Therapeutic Interventions/Progress Updates:  Patent seen for balance assessment as noted below.  Educated on fall risk 100% and need for walker, shoes, lighting, etc right now to decrease fall risk.  Patient ambulated with RW on level tile and ramped surfaces with min A due to R foot catching and assist for weight shift for safety.  Also noted R knee hyperextension and pt reports recent arthroscopy due to meniscal tear and decreased strength in that leg.  Wife present initially and reported she had assisted pt to bathroom and his back spasms started.  RN made aware, but noted spasms throughout tx.  Patient sit <> supine min A as pt use of momentum to bring legs onto bed.  Patient performed lower trunk and core strengthening activities legs on ball for trunk rotation and lower trunk flexion and bridging, all with assist.  Sidelying clamshells x 10 each side.  Patient performed chair to bed with mod A with RW.  Assist for positioning and left with all needs within reach and bed alarm active.    Therapy Documentation Precautions:  Precautions Precautions: Fall Restrictions Weight Bearing Restrictions: No Pain: Pain Assessment Pain Assessment: Faces Faces Pain Scale: Hurts even more Pain Type: Acute pain Pain Location: Back Pain Orientation: Lower;Medial Pain Descriptors / Indicators: Spasm Pain Onset: Progressive Pain Intervention(s): RN  made aware;Rest;Repositioned  Balance: Balance Balance Assessed: Yes Standardized Balance Assessment Standardized Balance Assessment: Berg Balance Test Berg Balance Test Sit to Stand: Needs moderate or maximal assist to stand Standing Unsupported: Able to stand 2 minutes with supervision Sitting with Back Unsupported but Feet Supported on Floor or Stool: Able to sit 2 minutes under supervision Stand to Sit: Needs assistance to sit Transfers: Needs one person to assist Standing Unsupported with Eyes Closed: Able to stand 3 seconds Standing Ubsupported with Feet Together: Needs help to attain position but able to stand for 30 seconds with feet together From Standing, Reach Forward with Outstretched Arm: Loses balance while trying/requires external support From Standing Position, Pick up Object from Floor: Unable to try/needs assist to keep balance From Standing Position, Turn to Look Behind Over each Shoulder: Needs assist to keep from losing balance and falling Turn 360 Degrees: Needs assistance while turning Standing Unsupported, Alternately Place Feet on Step/Stool: Needs assistance to keep from falling or unable to try Standing Unsupported, One Foot in Front: Needs help to step but can hold 15 seconds Standing on One Leg: Unable to try or needs assist to prevent fall Total Score: 11    See Function Navigator for Current Functional Status.   Therapy/Group: Individual Therapy  Reginia Naas 01/12/2017, 5:18 PM

## 2017-01-12 NOTE — Progress Notes (Signed)
Patient information reviewed and entered into eRehab system by Payzlee Ryder, RN, CRRN, PPS Coordinator.  Information including medical coding and functional independence measure will be reviewed and updated through discharge.    

## 2017-01-12 NOTE — Progress Notes (Signed)
Pt wears her home CPAP, pt stated she needed assistance. RT assisted pt putting on her  CPAP on and pt seemed comfortable.

## 2017-01-12 NOTE — Progress Notes (Signed)
[facility]     PROGRESS NOTE  Subjective/Complaints:  Pt seen sitting up in bed this morning. He states he slept fairly overnight, just as a result of being in the hospital. She has questions about protein supplementation.  ROS: Denies CP, SOB, nausea, vomiting, diarrhea.  Objective: Vital Signs: Blood pressure 129/62, pulse 67, temperature (!) 97.5 F (36.4 C), temperature source Oral, resp. rate 18, height 6' (1.829 m), weight 102.5 kg (226 lb), SpO2 97 %. No results found. Recent Labs    01/11/17 1904 01/12/17 0619  WBC 9.5 8.7  HGB 15.2 15.4  HCT 43.8 44.5  PLT 185 189   Recent Labs    01/11/17 1904 01/12/17 0619  NA 136 139  K 4.0 3.5  CL 102 106  GLUCOSE 138* 50*  BUN 11 11  CREATININE 0.75 0.75  CALCIUM 8.7* 8.9   CBG (last 3)  Recent Labs    01/11/17 2054 01/12/17 0631 01/12/17 0714  GLUCAP 96 42* 89    Wt Readings from Last 3 Encounters:  01/11/17 102.5 kg (226 lb)  01/10/17 102.4 kg (225 lb 12 oz)  11/27/16 104.3 kg (230 lb)    Physical Exam:  BP 129/62 (BP Location: Left Arm)   Pulse 67   Temp (!) 97.5 F (36.4 C) (Oral)   Resp 18   Ht 6' (1.829 m)   Wt 102.5 kg (226 lb)   SpO2 97%   BMI 30.65 kg/m  Constitutional: He appears well-developed and well-nourished.  HENT: Normocephalic and atraumatic.  Eyes: EOM are normal. No discharge. Neck: Dressing right neck at prior IJ site C/D/I. Cardiovascular: Normal rate and regular rhythm. No JVD Respiratory: Effort normal and breath sounds normal.   GI: Bowel sounds are normal. He exhibits no distension.   Musculoskeletal: He exhibits no edema or tenderness.  Neurological: He is alert and oriented.  Speech clear.  Cognition intact and able to follow commands without difficulty.  Motor: B/l UE: 5/5 proximal to distal B/l LE: HF 2+/5, KE 3-/5, ADF/PF 4-/5 (stable)  Sensation diminished to light touch distal to knees and elbows  Skin: Skin is warm and dry.   Psychiatric: He has a normal mood and affect. His behavior is normal. Judgment and thought content normal.   Assessment/Plan: 1. 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 Physiatrist is providing close team supervision and 24 hour management of active medical problems listed below. Physiatrist and rehab team continue to assess barriers to discharge/monitor patient progress toward functional and medical goals.  Function:  Bathing Bathing position      Bathing parts      Bathing assist        Upper Body Dressing/Undressing Upper body dressing                    Upper body assist        Lower Body Dressing/Undressing Lower body dressing                                  Lower body assist        Toileting Toileting          Toileting assist     Transfers Chair/bed transfer             Locomotion Ambulation  Wheelchair          Cognition Comprehension    Expression    Social Interaction    Problem Solving    Memory      Medical Problem List and Plan: 1.  Weakness, limitations with ADLs secondary to demyelinating myeloneuropathy. ---completed plasmapheresis on 11/24   Begin CIR 2.  DVT Prophylaxis/Anticoagulation: Pharmaceutical: Lovenox 3. Pain Management: N/A 4. Mood: Remains optimistic. LCSW to follow for evaluation and support.  5. Neuropsych: This patient is capable of making decisions on his own behalf. 6. Skin/Wound Care: Routine pressure relief measures. Maintain adequate nutritional and hydration status.  7. Fluids/Electrolytes/Nutrition: Monitor I/Os. Added nutritional supplement to help promote healing and for BS stabilization.  8. Demyelinating polyneuropathy: See above 9.T2DM: Hgb A1c- 7.3 (managed by Dr. Loanne Drilling) Monitor BS ac/hs. Has insulin pump with monitor.    Anticipate labile  blood sugars today due to malfunction of pump yesterday. Hypoglycemia this morning. Monitor with increased mobility 10. BPH/Neurogenic bladder: On finasteride in am.    Will check PVR X 3 pending 11. HTN: Monitor BP bid. Continue lisinopril   Monitor with increased mobility 12. Anxiety disorder: Due to work stress and has been managed with buspar.  13. Constipation: Resolved with bowel regimen of miralax bid.  LOS (Days) 1 A FACE TO FACE EVALUATION WAS PERFORMED  Jada Kuhnert Lorie Phenix 01/12/2017 8:29 AM

## 2017-01-12 NOTE — Progress Notes (Signed)
PMR Admission Coordinator Pre-Admission Assessment  Patient: Kenneth Pace is an 66 y.o., male MRN: [id number] DOB: 08/08/1950 Height: 6' (182.9 cm) Weight: 102.4 kg (225 lb 12 oz)                                                                                                                                                  Insurance Information HMO:     PPO: X     PCP:      IPA:      80/20:      OTHER:  PRIMARY: [employer] for [employer]      Policy#: [id number]      Subscriber: Self CM Name: Rochele Raring      Phone#: [phone]     Fax#: [phone] Pre-Cert#: [id number]  70/35/0/-10/20/79 with updates due to CM on 01/18/17    Employer: [employer] Benefits:  Phone #: Verified online     Name: [email or web address] Eff. 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 [name] readmitted on 12/29/16 withreports of progressive decline that started during his trip with tightness around abdomen with difficulty breathing,progressive difficulty walking with numbness, difficulty  swallowing and inability to void.Has been evaluated by neurology with evidence of demyelinating neuropathy on EMG. Dr. Rory Percy recommended evaluation for polyradiculoneuropathy v/s transverse myelitis--doubt GBS. MRI thoracic spine revealed multifocal disc protrusions with cord flattening T5-T10 without significant stenosis. Patient with tetraplegia and neuro felt exam due todemyelinatingmyeloneuropathy.   Work up underway to rule out paraneoplastic panel.LP done yesterday revealing elevated protein 121 and WBC - 7. MRI brain/cervical spinerepeated and was negative foracute intracranial abnormality and multilevel moderate stenosis C3/4 and C5/6 with mild cord deformity but no signal changes.CT abdomen/pelvis done to rule out paraneoplastic syndrome and was negative for acute abnormality. Incidental findings of fairly large amount of stool in colon, thickened bladder wall --  favor chronic neurogenic bladder and enlarged prostate causing mass effect on bladder.   Dr. Kathyrn Sheriff consulted and recommended trial of plasma exchange as symptoms favor AIDP but could have component of symptomatic cervical myelopathy. If he does not show improvement to consider decompression of cervical spondylosis.He completed 5 rounds of treatment and is showing some improvement.  Therapies recommending IP Rehab for post acute therapies and patient admitted 01/11/17.   Past Medical History      Past Medical History:  Diagnosis Date  . ADENOIDECTOMY, HX OF 02/16/2007  . ANXIETY 02/16/2007  . BENIGN PROSTATIC HYPERTROPHY, WITH OBSTRUCTION 02/03/2010  . DIABETES MELLITUS, TYPE I, UNCONTROLLED 12/29/2006  . ERECTILE DYSFUNCTION 02/16/2007  . HYPERLIPIDEMIA 02/16/2007  . HYPERTENSION 02/16/2007  . TESTICULAR MASS, LEFT 02/16/2007    Family History  family history includes Dementia in his mother; Diabetes Mellitus I in his mother; Healthy in his sister; Hypertension in his father; Rheum arthritis in his  brother.  Prior Rehab/Hospitalizations:  Has the patient had major surgery during 100 days prior to admission? No  Current Medications   Current Facility-Administered Medications:  .  acetaminophen (TYLENOL) tablet 650 mg, 650 mg, Oral, Q6H PRN, Ivor Costa, MD, 650 mg at 01/03/17 2241 .  bisacodyl (DULCOLAX) suppository 10 mg, 10 mg, Rectal, Daily PRN, Jani Gravel, MD, 10 mg at 01/02/17 2119 .  busPIRone (BUSPAR) tablet 30 mg, 30 mg, Oral, QHS, Georgette Shell, MD, 30 mg at 01/10/17 2225 .  finasteride (PROSCAR) tablet 5 mg, 5 mg, Oral, Daily, Ivor Costa, MD, 5 mg at 01/10/17 2225 .  hydrALAZINE (APRESOLINE) injection 5 mg, 5 mg, Intravenous, Q2H PRN, Ivor Costa, MD .  insulin pump, , Subcutaneous, TID AC, HS, 0200, Cherene Altes, MD, 3 each at 01/11/17 1211 .  lisinopril (PRINIVIL,ZESTRIL) tablet 2.5 mg, 2.5 mg, Oral, Daily, 2.5 mg at 01/10/17 2225 **AND** [DISCONTINUED] hydrochlorothiazide (MICROZIDE) capsule 12.5 mg, 12.5 mg, Oral, Daily, Cherene Altes, MD, 12.5 mg at 01/02/17 2120 .  magnesium hydroxide (MILK OF MAGNESIA) suspension 30 mL, 30 mL, Oral, Daily PRN, Bodenheimer, Charles A, NP, 30 mL at 01/06/17 2203 .  multivitamin with minerals tablet 1 tablet, 1 tablet, Oral, Daily, Ivor Costa, MD, 1 tablet at 01/10/17 2225 .  ondansetron (ZOFRAN) tablet 4 mg, 4 mg, Oral, Q6H PRN **OR** ondansetron (ZOFRAN) injection 4 mg, 4 mg, Intravenous, Q6H PRN, Ivor Costa, MD, 4 mg at 01/03/17 1010 .  polyethylene glycol (MIRALAX / GLYCOLAX) packet 17 g, 17 g, Oral, BID, Cherene Altes, MD, 17 g at 01/07/17 2152 .  senna-docusate (Senokot-S) tablet 1 tablet, 1 tablet, Oral, QHS PRN, Ivor Costa, MD, 1 tablet at 01/01/17 2218 .  traMADol (ULTRAM) tablet 50 mg, 50 mg, Oral, Q6H PRN, Jani Gravel, MD, 50 mg at 01/03/17 2341 .  vitamin B-12 (CYANOCOBALAMIN) tablet 1,000 mcg, 1,000 mcg, Oral, Daily, Ivor Costa, MD, 1,000 mcg at 01/10/17 2225 .  vitamin E capsule 100 Units, 100 Units, Oral,  Daily, Greta Doom, MD, 100 Units at 01/10/17 2226  Patients Current Diet: Diet heart healthy/carb modified Room service appropriate? 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 He was active enjoyed his dogs and rebuilding car engines.  Home Assistive Devices / [place]  Assistive Devices/Equipment: Gilford Rile (specify type) Home Equipment: Cane - single point, Crutches, [place] 2 wheels, Walker - 4 wheels, Electric scooter  Prior Device Use: Indicate devices/aids used by the patient prior to current illness, exacerbation or injury? None then there has been a slow decline since late summer, into fall and progressed to a cane and then a walker.  Prior Functional Level Prior Function Level of Independence: Needs assistance Gait / Transfers Assistance Needed: Reports he has been requiring increased assist for gait with RW and has had a fall secondary to weakness.  ADL's / Homemaking Assistance Needed: Has been sponge bathing with assist from his wife. Reports increasing difficulty with dressing.   Self Care: Did the patient need help bathing, dressing, using the toilet or eating? Independent  Indoor Mobility: Did the patient need assistance with walking from room to room (with or without device)? Independent  Stairs: Did the patient need assistance with internal or external stairs (with or without device)? Independent  Functional Cognition: Did the patient need help planning regular tasks such as shopping or remembering to take medications? Independent  Current Functional Level Cognition  Overall Cognitive Status: Within Functional Limits for tasks assessed Orientation  Level: Oriented X4 General Comments: Stuttering is pt's baseline    Extremity Assessment (includes Sensation/Coordination)  Upper Extremity Assessment: RUE deficits/detail, LUE deficits/detail RUE Deficits / Details: Greatest difficulty with purposeful release. Decreased coordination with opposition. Functional shoulder AROM. 3/5 strength at elbow with 2/5 grasp strength. Decreased coordination.  RUE Sensation: decreased light touch RUE Coordination: decreased fine motor, decreased gross motor LUE Deficits / Details: Decreased coordination with opposition. Functional shoulder AROM. 3/5 strength grossly at elbow. 2-/5 grasp strength.  LUE Coordination: decreased fine motor  Lower Extremity Assessment: Defer to PT evaluation RLE Deficits / Details: Only able to perform partial heel slide in supine secondary to weakness. 2-/5 in hip flexors; 3/5 in ankles.  LLE Deficits / Details: Only able to perform partial heel slide in supine secondary to weakness. 2-/5 in hip flexors, and 3/5 in ankles.     ADLs  Overall ADL's : Needs assistance/impaired Eating/Feeding: Bed level, Supervision/ safety Eating/Feeding Details (indicate cue type and reason): with built-up handles Grooming: Minimal assistance, Wash/dry hands, Wash/dry face, Oral care, Standing Grooming Details (indicate cue type and reason): difficulty opening toothpaste and to squeeze toothpaste container Upper Body Bathing: Bed level, Moderate assistance Lower Body Bathing: Maximal assistance, Bed level Upper Body Dressing : Bed level, Moderate assistance Lower Body Dressing: Maximal assistance, Bed level Toilet Transfer: Minimal assistance, Ambulation Toilet Transfer Details (indicate cue type and reason): Noted compensatory movements to achieve stability on his feet.  Functional mobility during ADLs: Minimal assistance, Rolling walker General ADL Comments: Continue to note intrinsic minus positioning in R hand especially with fatigue  this session.     Mobility  Overal bed mobility: Needs Assistance Bed Mobility: Supine to Sit, Sit to Supine Rolling: Min guard Sidelying to sit: Min assist Supine to sit: Min assist Sit to supine: Min guard General bed mobility comments: definite use of bed rail, increased time, labored effort, HOB flat    Transfers  Overall transfer level: Needs assistance Equipment used: Rolling walker (2 wheeled) Transfers: Sit to/from Stand Sit to Stand: Mod assist Stand pivot transfers: Min assist, Mod assist, +2 physical assistance General transfer comment: labored effort, modA to power up via pushing up from bed and to maintain balance during transition of hands    Ambulation / Gait / Stairs / Emergency planning/management officer  Ambulation/Gait Ambulation/Gait assistance: Min assist Ambulation Distance (Feet): 100 Feet Assistive device: Rolling walker (2 wheeled) Gait Pattern/deviations: Step-through pattern, Decreased stride length, Decreased dorsiflexion - right, Decreased dorsiflexion - left, Trunk flexed General Gait Details: pt with non-fluient gait pattern due to inconsistent foot clearence. Pt very dependent on UEs but improved from last session. pt compensates with hip hiking as well Gait velocity: decreased Gait velocity interpretation: <1.8 ft/sec, indicative of risk for recurrent falls    Posture / Balance Dynamic Sitting Balance Sitting balance - Comments: Requires assist for dynamic balance.  Balance Overall balance assessment: Needs assistance Sitting-balance support: Feet unsupported, No upper extremity supported Sitting balance-Leahy Scale: Fair Sitting balance - Comments: Requires assist for dynamic balance.  Standing balance support: Bilateral upper extremity supported Standing balance-Leahy Scale: Poor Standing balance comment: Relies on external assist to stand statically.     Special needs/care consideration BiPAP/CPAP: Yes, CPAP CPM: No Continuous Drip IV:  No Dialysis: No        Life Vest: No Oxygen: No Special Bed: No Trach Size: No Wound Vac (area): No       Skin: Dry, Abrasions to bilateral legs and toes                               Bowel mgmt: Continent, last BM 01/10/17 Bladder mgmt: Acute urinary retention with foley catheter placed initially, voiding trials, and now continent  Diabetic mgmt: Yes, managed with an insulin pump prior to admission      Previous Home Environment Living Arrangements: Spouse/significant other Available Help at Discharge: Family, Available 24 hours/day Type of Home: House Home Layout: Two level, Bed/bath upstairs, Able to live on main level with bedroom/bathroom Home Access: Stairs to enter Entrance Stairs-Rails: None Entrance Stairs-Number of Steps: 3 Bathroom Shower/Tub: Chiropodist: Standard Home Care Services: No Additional Comments: Reports he has not been going upstairs as it has become too difficult.   Discharge Living Setting Plans for Discharge Living Setting: Patient's home, Lives with (comment)(Spouse) Type of Home at Discharge: House Discharge Home Layout: Two level, Able to live on main level with bedroom/bathroom Alternate Level Stairs-Rails: Can reach both Alternate Level Stairs-Number of Steps: 17 Discharge Home Access: Stairs to enter Entrance Stairs-Rails: None Entrance Stairs-Number of Steps: 3 Discharge Bathroom Shower/Tub: Tub/shower unit, Curtain Discharge Bathroom Toilet: Standard Discharge Bathroom Accessibility: Yes How Accessible: Accessible via walker Does the patient have any problems obtaining your medications?: No  Social/Family/Support Systems Patient Roles: Spouse, Parent Contact Information: Spouse Lexicographer  Anticipated Caregiver: Spouse, Manufacturing systems engineer Information: cell:[phone] Ability/Limitations of Caregiver: Spouse works from home  Caregiver Availability: 24/7 Discharge Plan Discussed  with Primary Caregiver: Yes Is Caregiver In Agreement with Plan?: Yes Does Caregiver/Family have Issues with Lodging/Transportation while Pt is in Rehab?: No  Goals/Additional Needs Patient/Family Goal for Rehab: PT/OT: Mod I -Supervision  Expected length of stay: 11-17 days  Cultural Considerations: None Dietary Needs: Heart Healthy/Carb Mod. diet restrictions  Equipment Needs: TBD Special Service Needs: Pt with insulin pump Pt/Family Agrees to Admission and willing to participate: Yes Program Orientation Provided & Reviewed with Pt/Caregiver Including Roles  & Responsibilities: Yes Additional Information Needs: None  Decrease burden of Care through IP rehab admission: No  Possible need for SNF placement upon discharge: No  Patient Condition: This patient's medical and functional status has changed since the consult dated: 01/01/17 in which the Rehabilitation Physician determined and documented that  the patient's condition is appropriate for intensive rehabilitative care in an inpatient rehabilitation facility. See "History of Present Illness" (above) for medical update. Functional changes are: Mod A transfers and Min A 100 feet gait with rolling walker. Patient's medical and functional status update has been discussed with the Rehabilitation physician and patient remains appropriate for inpatient rehabilitation. Will admit to inpatient rehab today.  Preadmission Screen Completed By:  Gunnar Fusi, 01/11/2017 2:41 PM ______________________________________________________________________   Discussed status with Dr. Posey Pronto on 01/11/17 at 1530 and received telephone approval for admission today.  Admission Coordinator:  Gunnar Fusi, time 1530/Date 01/11/17             Cosigned by: Jamse Arn, MD at 01/11/2017 3:39 PM  Revision History

## 2017-01-12 NOTE — Evaluation (Signed)
Physical Therapy Assessment and Plan  Patient Details  Name: Kenneth Pace MRN: [id number] Date of Birth: [birth year]  PT Diagnosis: Abnormality of gait, Coordination disorder, Difficulty walking, Impaired sensation and Muscle weakness Rehab Potential: Excellent ELOS: 12-14 days   Today's Date: 01/12/2017 PT Individual Time: 0900-1000 PT Individual Time Calculation (min): 60 min    Problem List:  Patient Active Problem List   Diagnosis Date Noted  . Type 2 diabetes mellitus with peripheral neuropathy (HCC)   . Generalized anxiety disorder   . 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 Anxiety state   . Neurogenic bladder   . Depression 12/30/2016  . Leg weakness, bilateral 12/30/2016  . 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 GBS (Guillain Barre syndrome) ([place])   . 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 Weakness 11/20/2016  . Weakness of both arms 10/30/2016  . Numbness 03/18/2016  . Diabetes mellitus without complication ([place]) 00/17/4944  . Eustachian tube dysfunction 02/12/2015  . Acute maxillary sinusitis 02/12/2015  . Wellness examination 08/22/2014  . Alkaline phosphatase elevation 08/22/2014  . Screening for prostate cancer 04/24/2013  . Routine general medical examination at a health care facility 04/10/2012  . BPH (benign prostatic hyperplasia) 02/03/2010  . HEMOCCULT POSITIVE STOOL 05/25/2008  . ELBOW PAIN, RIGHT 07/04/2007  . Dyslipidemia 02/16/2007  . ANXIETY 02/16/2007  . ERECTILE DYSFUNCTION 02/16/2007  . Essential hypertension 02/16/2007  . TESTICULAR MASS, LEFT 02/16/2007  . KNEE PAIN, CHRONIC 02/16/2007  . ADENOIDECTOMY, HX OF 02/16/2007    Past Medical History:  Past Medical History:  Diagnosis Date  . ADENOIDECTOMY, HX OF 02/16/2007  . Anxiety    pt. states he does not have anxiety.  Marland Kitchen BENIGN PROSTATIC HYPERTROPHY, WITH OBSTRUCTION 02/03/2010   . DIABETES MELLITUS, TYPE I, UNCONTROLLED 12/29/2006  . ERECTILE DYSFUNCTION 02/16/2007  . HYPERLIPIDEMIA 02/16/2007  . HYPERTENSION 02/16/2007  . TESTICULAR MASS, LEFT 02/16/2007   Past Surgical History:  Past Surgical History:  Procedure Laterality Date  . KNEE SURGERY     2 Lknee arthroscopy, 3 R knee arthroscpoy  . KNEE SURGERY Right 11/12/2016  . TONSILLECTOMY      Assessment & Plan Clinical Impression: Patient is a is a 66 y.o.male with history of T2DM, OSA,,subacute inflammatory polyradiculoneuropathy treated withIVIG for lower extremity weakness10/2018 and was attending outpatient PT.History taken from chart review and patient. Patient recently returned from cruise --used Doctor, general practice get around. [name] readmitted on 12/29/16 withreports of progressive decline that started during his trip with tightness around abdomen with difficulty breathing,progressive difficulty walking with numbness, difficulty swallowing and inability to void.Has been evaluated by neurology with evidence of demyelinating neuropathy on EMG. Dr. Rory Percy recommended evaluation for polyradiculoneuropathy v/s transverse myelitis--doubt GBS. MRI thoracic spine revealed multifocal disc protrusions with cord flattening T5-T10 without significant stenosis. 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   Work up underway to rule out paraneoplastic panel.LP done yesterday revealing elevated protein 121 and WBC - 7. MRI brain reviewed, unremarkable for acute process.  Per report brain/cervical spineMRI repeated and was negative foracute intracranial abnormality and multilevel moderate stenosis C3/4 and C5/6 with mild cord deformity but no signal changes. CT abdomen/pelvis done to rule out paraneoplastic syndrome and was negative for acute abnormality. Incidental findings of fairly large amount of stool in colon,  thickened bladder wall --favor chronic neurogenic  bladder and enlarged prostate causing mass effect on bladder.    Dr. Kathyrn Sheriff consulted and recommended trial of plasma exchange as  symptoms favor AIDP but could have component of symptomatic cervical myelopathy. If he does not show improvement to consider decompression of cervical spondylosis. He completed 5 rounds of treatment and is showing some improvement. NIF stable and tends to have clawing right hand with fatigue.     Patient transferred to CIR on 01/11/2017 .   Patient currently requires mod with mobility secondary to muscle weakness, decreased cardiorespiratoy endurance, unbalanced muscle activation and decreased coordination and decreased standing balance, decreased postural control and decreased balance strategies.  Prior to hospitalization, patient was modified independent  with mobility and lived with Spouse in a House home.  Home access is 2Stairs to enter.  Patient will benefit from skilled PT intervention to maximize safe functional mobility, minimize fall risk and decrease caregiver burden for planned discharge home with 24 hour supervision.  Anticipate patient will benefit from follow up OP at discharge.  PT - End of Session Activity Tolerance: Tolerates 30+ min activity with multiple rests Endurance Deficit: Yes Endurance Deficit Description: requires seated rest break after short duration mobility activities PT Assessment Rehab Potential (ACUTE/IP ONLY): Excellent PT Patient demonstrates impairments in the following area(s): Balance;Sensory;Endurance;Motor;Safety PT Transfers Functional Problem(s): Bed Mobility;Bed to Chair;Car;Furniture PT Locomotion Functional Problem(s): Ambulation;Wheelchair Mobility;Stairs PT Plan PT Intensity: Minimum of 1-2 x/day ,45 to 90 minutes PT Frequency: 5 out of 7 days PT Duration Estimated Length of Stay: 12-14 days PT Treatment/Interventions: Ambulation/gait training;Discharge planning;Functional mobility training;Psychosocial  support;Therapeutic Activities;Wheelchair propulsion/positioning;Therapeutic Exercise;Skin care/wound management;Neuromuscular re-education;Disease management/prevention;Balance/vestibular training;DME/adaptive equipment instruction;UE/LE Strength taining/ROM;UE/LE Coordination activities;Stair training;Patient/family education;Functional electrical stimulation;Community reintegration PT Transfers Anticipated Outcome(s): modI PT Locomotion Anticipated Outcome(s): modI PT Recommendation Recommendations for Other Services: Neuropsych consult;Therapeutic Recreation consult Therapeutic Recreation Interventions: Pet therapy;Outing/community reintergration Follow Up Recommendations: Outpatient PT Patient destination: Home Equipment Recommended: To be determined Equipment Details: has cane, RW, rollator  Skilled Therapeutic Intervention Pt received seated in w/c, denies pain and agreeable to treatment. PT initial evaluation performed and completed as described below with modA for sit <>stand and min A/min guard for stand pivot transfers and ambulation. Pt primarily limited by proximal LE weakness and incoordination. Educated pt on rehab process, goals, estimated LOS, falls prevention safety; pt agreeable to all the above. Remained seated in w/c at end of session, all needs in reach.   PT Evaluation Precautions/Restrictions Precautions Precautions: Fall Restrictions Weight Bearing Restrictions: No General Chart Reviewed: Yes Response to Previous Treatment: Not applicable Family/Caregiver Present: No  Pain Pain Assessment Pain Assessment: No/denies pain Home Living/Prior Functioning Home Living Available Help at Discharge: Family;Available 24 hours/day Type of Home: House Home Access: Stairs to enter [employer] of Steps: 2 Entrance Stairs-Rails: Can reach both;Left;Right Home Layout: Two level;Bed/bath upstairs;Able to live on main level with bedroom/bathroom Alternate Level  Stairs-Number of Steps: 17 Additional Comments: Reports he has not been going upstairs as it has become too difficult.   Lives With: Spouse Prior Function  Able to Take Stairs?: Yes Vision/Perception  Perception Perception: Within Functional Limits Praxis Praxis: Intact  Cognition Overall Cognitive Status: Within Functional Limits for tasks assessed Arousal/Alertness: Awake/alert Orientation Level: Oriented X4 Memory: Appears intact Awareness: Appears intact Problem Solving: Appears intact Safety/Judgment: Appears intact Sensation Sensation Light Touch: Impaired Detail Light Touch Impaired Details: Impaired RUE;Impaired LUE;Impaired RLE;Impaired LLE(reports numbness from elbow level distally) Hot/Cold: Appears Intact(Pt reports water in shower feeling the same throughout) Proprioception: Appears Intact Coordination Gross Motor Movements are Fluid and Coordinated: No Fine Motor Movements are Fluid and Coordinated: No Coordination and Movement Description: Ataxic with decreased  coordination and weakness Heel Shin Test: unable d/t hip flexor strength deficits Motor  Motor Motor - Skilled Clinical Observations: Core strength deficits/ discoordination; UE distal weakness  Mobility Bed Mobility Bed Mobility: Sit to Supine;Supine to Sit Supine to Sit: 5: Supervision;HOB flat Supine to Sit Details: Verbal cues for precautions/safety;Verbal cues for technique Sit to Supine: 5: Supervision;HOB flat Sit to Supine - Details: Verbal cues for precautions/safety;Verbal cues for technique Transfers Transfers: Yes Sit to Stand: 3: Mod assist Sit to Stand Details: Verbal cues for precautions/safety;Tactile cues for weight shifting;Tactile cues for posture;Verbal cues for safe use of DME/AE;Tactile cues for initiation Sit to Stand Details (indicate cue type and reason): cues for hand placement on w/c, assist for initiating sit >stand Stand to Sit: 4: Min guard;With armrests Stand Pivot  Transfers: 3: Mod assist Stand Pivot Transfer Details: Verbal cues for precautions/safety;Verbal cues for technique;Verbal cues for safe use of DME/AE;Tactile cues for posture Locomotion  Ambulation Ambulation: Yes Ambulation/Gait Assistance: 4: Min guard Ambulation Distance (Feet): 100 Feet Assistive device: Rolling walker Gait Gait: Yes Gait Pattern: Impaired Gait Pattern: Left genu recurvatum;Right genu recurvatum;Poor foot clearance - left;Poor foot clearance - right;Decreased trunk rotation;Decreased stride length Gait velocity: decreased Stairs / Additional Locomotion Stairs: Yes Stairs Assistance: 4: Min guard Stair Management Technique: Two rails;Step to pattern;Forwards Number of Stairs: 8 Height of Stairs: 3 Architect: Yes Wheelchair Assistance: 5: Investment banker, operational Details: Verbal cues for Marketing executive: Both upper extremities Wheelchair Parts Management: Needs assistance Distance: 150'  Trunk/Postural Assessment  Cervical Assessment Cervical Assessment: Within Functional Limits Thoracic Assessment Thoracic Assessment: Exceptions to WFL(Kyphotic) Lumbar Assessment Lumbar Assessment: Exceptions to WFL(Posterior pelvic tilt) Postural Control Postural Control: Deficits on evaluation(Poor core control and trunkal stability)  Balance Balance Balance Assessed: Yes Static Sitting Balance Static Sitting - Balance Support: Feet supported Static Sitting - Level of Assistance: 5: Stand by assistance Dynamic Sitting Balance Sitting balance - Comments: Sitting to complete bathing task Static Standing Balance Static Standing - Balance Support: During functional activity;Right upper extremity supported;Left upper extremity supported Static Standing - Level of Assistance: 4: Min assist;3: Mod assist Static Standing - Comment/# of Minutes: Standing to complete bathing/dressing Dynamic Standing Balance Dynamic  Standing - Balance Support: During functional activity;Right upper extremity supported;Left upper extremity supported Dynamic Standing - Level of Assistance: 3: Mod assist Dynamic Standing - Comments: Standing to pull up pants Extremity Assessment  RUE Assessment RUE Assessment: Exceptions to [facility] RUE Strength RUE Overall Strength: Deficits(4/5 throughout; weak gross grasp) LUE Assessment LUE Assessment: Exceptions to WFL(4/5 throughout; weak gross grasp) RLE Assessment RLE Assessment: Exceptions to [facility] RLE Strength RLE Overall Strength: Deficits Right Hip Flexion: 2-/5 Right Hip Extension: 3/5 Right Hip ABduction: 4-/5 Right Hip ADduction: 4-/5 Right Knee Flexion: 4+/5 Right Knee Extension: 4+/5 Right Ankle Dorsiflexion: 4+/5 Right Ankle Plantar Flexion: 4+/5 LLE Assessment LLE Assessment: Exceptions to WFL LLE Strength LLE Overall Strength: Deficits Left Hip Flexion: 2-/5 Left Hip Extension: 3/5 Left Hip ABduction: 4-/5 Left Hip ADduction: 4-/5 Left Knee Flexion: 4+/5 Left Knee Extension: 4+/5 Left Ankle Dorsiflexion: 4+/5 Left Ankle Plantar Flexion: 4+/5   See Function Navigator for Current Functional Status.   Refer to Care Plan for Long Term Goals  Recommendations for other services: Neuropsych and Therapeutic Recreation  Pet therapy and Outing/community reintegration  Discharge Criteria: Patient will be discharged from PT if patient refuses treatment 3 consecutive times without medical reason, if treatment goals not met, if there is a change in medical  status, if patient makes no progress towards goals or if patient is discharged from hospital.  The above assessment, treatment plan, treatment alternatives and goals were discussed and mutually agreed upon: by patient  Luberta Mutter 01/12/2017, 12:02 PM

## 2017-01-13 ENCOUNTER — Inpatient Hospital Stay (HOSPITAL_COMMUNITY): Payer: BLUE CROSS/BLUE SHIELD | Admitting: Physical Therapy

## 2017-01-13 ENCOUNTER — Inpatient Hospital Stay (HOSPITAL_COMMUNITY): Payer: BLUE CROSS/BLUE SHIELD | Admitting: Occupational Therapy

## 2017-01-13 DIAGNOSIS — R7309 Other abnormal glucose: Secondary | ICD-10-CM

## 2017-01-13 DIAGNOSIS — M62838 Other muscle spasm: Secondary | ICD-10-CM

## 2017-01-13 DIAGNOSIS — I10 Essential (primary) hypertension: Secondary | ICD-10-CM

## 2017-01-13 LAB — GLUCOSE, CAPILLARY
GLUCOSE-CAPILLARY: 221 mg/dL — AB (ref 65–99)
GLUCOSE-CAPILLARY: 156 mg/dL — AB (ref 65–99)
Glucose-Capillary: 153 mg/dL — ABNORMAL HIGH (ref 65–99)
Glucose-Capillary: 71 mg/dL (ref 65–99)

## 2017-01-13 NOTE — Progress Notes (Signed)
Occupational Therapy Session Note  Patient Details  Name: Kenneth Pace MRN: [id number] Date of Birth: 1950/11/19  Today's Date: 01/13/2017 OT Individual Time: 1100-1200 OT Individual Time Calculation (min): 60 min    Short Term Goals: Week 1:  OT Short Term Goal 1 (Week 1): Pt will complete 2/3 toileting tasks with steadying assist OT Short Term Goal 2 (Week 1): Pt will don shirt with set-up OT Short Term Goal 3 (Week 1): Pt will don pants and socks using AE PRN with min A OT Short Term Goal 4 (Week 1): Pt will complete grooming task in standing with CGA for balance  Skilled Therapeutic Interventions/Progress Updates:    Pt seen for OT session focusing on ADL re-training, core strengthening/stability, and and functional grasping. Pt sitting up in w/c upon arrival, voicing increased fatigue from previous session, though agreeable to tx session. He declined bathing this morning, opting just to change shirt. He was able to don shirt, with increased time this session. Attempted to stand at sink to complete oral care, however, following max A to stand, pt unable to advance LEs to walk towards sink, pt noting he was min A ambulation this morning and was limited now by fatigue. Therefore, for energy conservation completed oral care seated at sink mod I.  In therapy gym, completed core strengthening/ stability exercises, pt required to weight shift forward from back of w/c into unsupported sitting position in order to reach for object. Required increased time and effort to come into sitting position, however, able to maintain dynamic unsupported sitting balance with supervision. Incorportated gross grasping aspect into task, pt able to grasp and release beanbags with increased time. He then used reacher to pick up items from floor from seated position, demonstrated functional grasp needed to use reacher in prep for LB dressing task. Pt returned to room at end of session, requesting return to  supine. Max A stand pivot with RW to bed and mod A to return to supine. Pt left with all needs in reach, bed alarm on.  Therapy Documentation Precautions:  Precautions Precautions: Fall Restrictions Weight Bearing Restrictions: No Pain:   Complaints of muscle spasms, RN made aware, declined pain medicine.   See Function Navigator for Current Functional Status.   Therapy/Group: Individual Therapy  Lewis, Orion Vandervort C 01/13/2017, 7:13 AM

## 2017-01-13 NOTE — Progress Notes (Signed)
Physical Therapy Session Note  Patient Details  Name: Kenneth Pace MRN: [id number] Date of Birth: [birth year]  Today's Date: 01/13/2017 PT Individual Time: 1300-1400 PT Individual Time Calculation (min): 60 min   Short Term Goals: Week 1:  PT Short Term Goal 1 (Week 1): Pt will perform bed mobility with modI PT Short Term Goal 2 (Week 1): Pt will perform stand pivot transfers with consistent S PT Short Term Goal 3 (Week 1): Pt will ambulate 150' with RW and S PT Short Term Goal 4 (Week 1): Pt will ascend/descend four 6" steps with minA  Skilled Therapeutic Interventions/Progress Updates: Pt presented in bed agreeable to therapy. Pt stating continues to have back spasms however not scheduled for next meds until 2pm. Pt performed supine to sit with features and minA for truncal support and increased time. Pt performed stand pivot with RW with minA for sit to stand and min guard for pivot. Pt transported to rehab gym for energy conservation. Performed stand pivot to mat minA and participated in seated balance/care strengthening activities including ball toss and rotating ball taps. Pt required intermittent rests due to frequent spasms. Performed modified "sit ups" 2 x 10 for core strengthening. Transferred to supine and performed AA HS pulls and LTR to fatigue (approx 15). Pt required modA supine to sit from flat mat and returned to w/c in same manner as prior. Pt returned to w/c and transported back to room, returned to room in same manner as prior. Pt left in bed with call bell within reach and needs met.      Therapy Documentation Precautions:  Precautions Precautions: Fall Restrictions Weight Bearing Restrictions: No General:   Vital Signs:   Pain: Pain Assessment Pain Assessment: No/denies pain   See Function Navigator for Current Functional Status.   Therapy/Group: Individual Therapy  [name]  [name], PTA  01/13/2017, 4:08 PM

## 2017-01-13 NOTE — Progress Notes (Signed)
Physical Therapy Session Note  Patient Details  Name: Kenneth Pace MRN: [id number] Date of Birth: [birth year]  Today's Date: 01/13/2017 PT Individual [phone]   75 min   Short Term Goals: Week 1:  PT Short Term Goal 1 (Week 1): Pt will perform bed mobility with modI PT Short Term Goal 2 (Week 1): Pt will perform stand pivot transfers with consistent S PT Short Term Goal 3 (Week 1): Pt will ambulate 150' with RW and S PT Short Term Goal 4 (Week 1): Pt will ascend/descend four 6" steps with minA  Skilled Therapeutic Interventions/Progress Updates:   Pt received sitting on toilet and agreeable to PT. Supervision assist with heavy use of rails to perform sit<>stand and Perineal hygiene. Min-supervision assist gait through room with RW.   WC mobility instructed by PT with BUE support and tband on wheel rims 2 x 256f with supervision assist from PT.   PT instructed pt in gait training x 1558f+ 6017fith min assist and RW. Min cues for posture and foot clearance as tolerated .   Nustep reciprocal movement and endurance training 4 min +6 min with one rest break; level 4>3 with min cues for proper speed and ROM to reduce excessive fatigue.   Reciprocal stepping on 2 inch step with BUE support on RW x 8 + 6 with min assist from PT.   Standing tolerance and fine motor control task to play horse shoes. Min assist to prevent posterior LOB.   Throughout treatment, Pt required mi assist for all sit<>stand transfers, with cues for anterior weight shift and proper use of BUE.   Patient returned to room and left sitting in WC [facility] call bell in reach and all needs met.           Therapy Documentation Precautions:  Precautions Precautions: Fall Restrictions Weight Bearing Restrictions: No Vital Signs: Therapy Vitals Temp: 97.9 F (36.6 C) Temp Source: Oral Pulse Rate: 78 Resp: 19 BP: 123/66 Patient Position (if appropriate): Lying Oxygen Therapy SpO2: 97 % O2 Device:  CPAP Pain: 0/10   See Function Navigator for Current Functional Status.   Therapy/Group: Individual Therapy  AusLorie Phenix/28/2018, 8:02 AM

## 2017-01-13 NOTE — Progress Notes (Signed)
[facility] PHYSICAL MEDICINE & REHABILITATION     PROGRESS NOTE  Subjective/Complaints:  Pt seen sitting up in bed this AM.  He slept well overnight.  He states he had a good day of therapies, but had muscle spasms overnight, which were relieved with medications.   ROS: Denies CP, SOB, nausea, vomiting, diarrhea.  Objective: Vital Signs: Blood pressure 123/66, pulse 78, temperature 97.9 F (36.6 C), temperature source Oral, resp. rate 19, height 6' (1.829 m), weight 102.1 kg (225 lb), SpO2 97 %. No results found. Recent Labs    01/11/17 1904 01/12/17 0619  WBC 9.5 8.7  HGB 15.2 15.4  HCT 43.8 44.5  PLT 185 189   Recent Labs    01/11/17 1904 01/12/17 0619  NA 136 139  K 4.0 3.5  CL 102 106  GLUCOSE 138* 50*  BUN 11 11  CREATININE 0.75 0.75  CALCIUM 8.7* 8.9   CBG (last 3)  Recent Labs    01/12/17 1639 01/12/17 2110 01/13/17 0631  GLUCAP 253* 118* 221*    Wt Readings from Last 3 Encounters:  01/13/17 102.1 kg (225 lb)  01/10/17 102.4 kg (225 lb 12 oz)  11/27/16 104.3 kg (230 lb)    Physical Exam:  BP 123/66 (BP Location: Left Arm)   Pulse 78   Temp 97.9 F (36.6 C) (Oral)   Resp 19   Ht 6' (1.829 m)   Wt 102.1 kg (225 lb)   SpO2 97%   BMI 30.52 kg/m  Constitutional: He appears well-developed and well-nourished.  HENT: Normocephalic and atraumatic.  Eyes: EOM are normal. No discharge. Cardiovascular: RRR. No JVD Respiratory: Effort normal and breath sounds normal.   GI: Bowel sounds are normal. He exhibits no distension.   Musculoskeletal: He exhibits no edema or tenderness.  Neurological: He is alert and oriented.  Speech clear.  Cognition intact and able to follow commands without difficulty.  Motor: B/l UE: 5/5 proximal to distal B/l LE: HF 4--4/5, KE 4/5, ADF/PF 4+/5  Sensation diminished to light touch distal to knees and elbows  Skin: Skin is warm and dry.  Psychiatric: He has a normal mood and affect. His behavior is normal. Judgment and  thought content normal.   Assessment/Plan: 1. 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 Physiatrist is providing close team supervision and 24 hour management of active medical problems listed below. Physiatrist and rehab team continue to assess barriers to discharge/monitor patient progress toward functional and medical goals.  Function:  Bathing Bathing position   Position: Shower  Bathing parts Body parts bathed by patient: Right arm, Right upper leg, Left arm, Left upper leg, Abdomen, Right lower leg, Chest, Front perineal area, Left lower leg Body parts bathed by helper: Buttocks, Back  Bathing assist Assist Level: Touching or steadying assistance(Pt > 75%)      Upper Body Dressing/Undressing Upper body dressing   What is the patient wearing?: Pull over shirt/dress     Pull over shirt/dress - Perfomed by patient: Thread/unthread right sleeve, Thread/unthread left sleeve, Put head through opening Pull over shirt/dress - Perfomed by helper: Pull shirt over trunk        Upper body assist        Lower Body Dressing/Undressing Lower body dressing   What is the patient wearing?: Underwear, Pants, Shoes, Socks   Underwear - Performed by helper: Thread/unthread right underwear leg, Thread/unthread left underwear leg, Pull underwear up/down  Pants- Performed by helper: Thread/unthread right pants leg, Thread/unthread left pants leg, Pull pants up/down       Socks - Performed by helper: Don/doff right sock, Don/doff left sock   Shoes - Performed by helper: Don/doff right shoe, Don/doff left shoe          Lower body assist Assist for lower body dressing: Touching or steadying assistance (Pt > 75%)      Toileting Toileting   Toileting steps completed by patient: Adjust clothing prior to toileting Toileting steps completed by helper: Adjust  clothing after toileting Toileting Assistive Devices: Grab bar or rail  Toileting assist Assist level: More than reasonable time, Set up/obtain supplies   Transfers Chair/bed transfer   Chair/bed transfer method: Ambulatory Chair/bed transfer assist level: Touching or steadying assistance (Pt > 75%) Chair/bed transfer assistive device: Armrests, Medical sales representative     Max distance: 75 Assist level: Touching or steadying assistance (Pt > 75%)   Wheelchair   Type: Manual Max wheelchair distance: 50 Assist Level: Touching or steadying assistance (Pt > 75%)  Cognition Comprehension Comprehension assist level: Follows basic conversation/direction with extra time/assistive device  Expression Expression assist level: Expresses basic needs/ideas: With extra time/assistive device  Social Interaction Social Interaction assist level: Interacts appropriately with others with medication or extra time (anti-anxiety, antidepressant).  Problem Solving Problem solving assist level: Solves complex problems: With extra time  Memory Memory assist level: Recognizes or recalls 90% of the time/requires cueing < 10% of the time    Medical Problem List and Plan: 1.  Weakness, limitations with ADLs secondary to demyelinating myeloneuropathy. ---completed plasmapheresis on 11/24   Cont CIR 2.  DVT Prophylaxis/Anticoagulation: Pharmaceutical: Lovenox 3. Pain Management:    Flexaril PRN for muscle spasms 4. Mood: Remains optimistic. LCSW to follow for evaluation and support.  5. Neuropsych: This patient is capable of making decisions on his own behalf. 6. Skin/Wound Care: Routine pressure relief measures. Maintain adequate nutritional and hydration status.  7. Fluids/Electrolytes/Nutrition: Monitor I/Os. Added nutritional supplement to help promote healing and for BS stabilization.  8. Demyelinating polyneuropathy: See above 9.T2DM: Hgb A1c- 7.3 (managed by Dr. Loanne Drilling) Monitor BS ac/hs. Has  insulin pump with monitor.    Anticipate labile blood sugars today due to malfunction of pump.    Improving overall, cont to monitor   Monitor with increased mobility 10. BPH/Neurogenic bladder: On finasteride in am.  11. HTN: Monitor BP bid. Continue lisinopril   Controlled on 11/28 12. Anxiety disorder: Due to work stress and has been managed with buspar.  13. Constipation: Resolved with bowel regimen of miralax bid.  LOS (Days) 2 A FACE TO FACE EVALUATION WAS PERFORMED  Ankit Lorie Phenix 01/13/2017 8:42 AM

## 2017-01-14 ENCOUNTER — Inpatient Hospital Stay (HOSPITAL_COMMUNITY): Payer: BLUE CROSS/BLUE SHIELD

## 2017-01-14 ENCOUNTER — Inpatient Hospital Stay (HOSPITAL_COMMUNITY): Payer: BLUE CROSS/BLUE SHIELD | Admitting: Occupational Therapy

## 2017-01-14 ENCOUNTER — Inpatient Hospital Stay (HOSPITAL_COMMUNITY): Payer: BLUE CROSS/BLUE SHIELD | Admitting: *Deleted

## 2017-01-14 LAB — GLUCOSE, CAPILLARY
GLUCOSE-CAPILLARY: 137 mg/dL — AB (ref 65–99)
GLUCOSE-CAPILLARY: 303 mg/dL — AB (ref 65–99)
Glucose-Capillary: 67 mg/dL (ref 65–99)
Glucose-Capillary: 224 mg/dL — ABNORMAL HIGH (ref 65–99)

## 2017-01-14 MED ORDER — BACLOFEN 5 MG HALF TABLET
5.0000 mg | ORAL_TABLET | Freq: Three times a day (TID) | ORAL | Status: DC
  Administered 2017-01-14 – 2017-01-15 (×4): 5 mg via ORAL
  Filled 2017-01-14 (×4): qty 1

## 2017-01-14 MED ORDER — GABAPENTIN 100 MG PO CAPS: 100.0000 mg | ORAL_CAPSULE | Freq: Three times a day (TID) | ORAL | Status: DC

## 2017-01-14 MED ORDER — BACLOFEN 5 MG HALF TABLET: 5.0000 mg | ORAL_TABLET | Freq: Three times a day (TID) | ORAL | Status: DC

## 2017-01-14 MED ORDER — BACLOFEN 5 MG HALF TABLET: 5.0000 mg | ORAL_TABLET | Freq: Three times a day (TID) | ORAL | Status: DC | PRN

## 2017-01-14 NOTE — Progress Notes (Signed)
Social Work Patient ID: Kenneth Pace, male   DOB: 03/31/1950, 66 y.o.   MRN: [id number]   Have reviewed team conference with pt and wife.  Both aware and agreeable with targeted d/c date of 12/11, however, wife feels this is "optimistic" and is concerned about his struggle with ongoing spasms and this interfering with progress being made in therapies.  Therapies have attempted to spread out his schedule to allow for longer break in therapy day and hope this will work better.  Will continue to follow.  Genine Beckett, LCSW

## 2017-01-14 NOTE — Progress Notes (Signed)
Therapy says pt has spasms again. Talked to pt, co of tinging shock like pains running down bil lower extremities. Talked to wife on phone, felt gabapentin would be excellent choice to start pt on, asked if okay will see if can rotate another muscle relaxer for additional coverage. She said that pt needs more time between therapies to reduce amount of spasms notified PA

## 2017-01-14 NOTE — Progress Notes (Signed)
Occupational Therapy Session Note  Patient Details  Name: Kenneth Pace MRN: [id number] Date of Birth: [birth year]  Today's Date: 01/14/2017 OT Individual Time: 0762-2633 OT Individual Time Calculation (min): 65 min  and  Today's Date: 01/14/2017 OT Missed Time: 10 Minutes Missed Time Reason: Patient fatigue   Short Term Goals: Week 1:  OT Short Term Goal 1 (Week 1): Pt will complete 2/3 toileting tasks with steadying assist OT Short Term Goal 2 (Week 1): Pt will don shirt with set-up OT Short Term Goal 3 (Week 1): Pt will don pants and socks using AE PRN with min A OT Short Term Goal 4 (Week 1): Pt will complete grooming task in standing with CGA for balance  Skilled Therapeutic Interventions/Progress Updates:    Pt seen for OT ADL bathing/dressing session. Pt completing grooming tasks at sink from w/c level upon arrival, agreeable to tx session. He ambulated into bathroom with RW, mod A to stand from w/c  With heavy reliance on UEs. Mod A ambulation into bathroom, pt unable to clear B feet during steps and needing manual facilitation for weightshift and to assist in advancing L foot over bathroom threshold. He bathed seated on tub bench, lateral leans for buttock hygiene. Stand pivot to w/c from shower. He dressed seated in w/c using reacher to assist with threading underwear/ pants. He stood at [facility] to pull pants up with heavy steadying assist. Pt provided with sock aid and demonstration for use. Pt return demonstrated understanding with donning socks using sock aid.  He requested return to bed at end of session. Mod A for return to bed and supine, left with all needs in reach. Pt with increasing in occurrence and severity of LE spasms this session, RN and PA made aware.   Therapy Documentation Precautions:  Precautions Precautions: Fall Restrictions Weight Bearing Restrictions: No Pain:   no/denies pain   See Function Navigator for Current Functional  Status.   Therapy/Group: Individual Therapy  Lewis, Kayleen Alig C 01/14/2017, 7:10 AM

## 2017-01-14 NOTE — Patient Care Conference (Signed)
Inpatient RehabilitationTeam Conference and Plan of Care Update Date: 01/13/2017   Time: 2:40 PM    Patient Name: Kenneth Pace      Medical Record Number: [id number]  Date of Birth: 08/25/1950 Sex: Male         Room/Bed: 4W05C/4W05C-01 Payor Info: Payor: [place] / Plan: BCBS OTHER / Product Type: *No Product type* /    Admitting Diagnosis: sob  Admit Date/Time:  01/11/2017  5:22 PM Admission Comments: No comment available   Primary Diagnosis:  <principal problem not specified> Principal Problem: <principal problem not specified>  Patient Active Problem List   Diagnosis Date Noted  . Benign essential HTN   . Labile blood glucose   . Muscle spasm   . Type 2 diabetes mellitus with peripheral neuropathy (HCC)   . Generalized anxiety disorder   . Acute inflammatory demyelinating polyneuropathy ([place]) 01/11/2017  . Anxiety state   . Neurogenic bladder   . Depression 12/30/2016  . Leg weakness, bilateral 12/30/2016  . 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 GBS (Guillain Barre syndrome) ([place])   . AIDP (acute inflammatory demyelinating polyneuropathy) ([place]) 11/27/2016  . Weakness 11/20/2016  . Weakness of both arms 10/30/2016  . Numbness 03/18/2016  . Diabetes mellitus without complication ([place]) 71/69/6789  . Eustachian tube dysfunction 02/12/2015  . Acute maxillary sinusitis 02/12/2015  . Wellness examination 08/22/2014  . Alkaline phosphatase elevation 08/22/2014  . Screening for prostate cancer 04/24/2013  . Routine general medical examination at a health care facility 04/10/2012  . BPH (benign prostatic hyperplasia) 02/03/2010  . HEMOCCULT POSITIVE STOOL 05/25/2008  . ELBOW PAIN, RIGHT 07/04/2007  . Dyslipidemia 02/16/2007  . ANXIETY 02/16/2007  . ERECTILE DYSFUNCTION 02/16/2007  . Essential hypertension 02/16/2007  . TESTICULAR MASS, LEFT 02/16/2007  . KNEE PAIN, CHRONIC 02/16/2007  . ADENOIDECTOMY, HX OF  02/16/2007    Expected Discharge Date: Expected Discharge Date: 01/26/17  Team Members Present: Physician leading conference: Dr. Delice Lesch Social Worker Present: Lennart Pall, LCSW Nurse Present: Brita Romp, RN PT Present: Roderic Ovens, PT OT Present: Willeen Cass, OT SLP Present: Windell Moulding, SLP PPS Coordinator present : Daiva Nakayama, RN, CRRN     Current Status/Progress Goal Weekly Team Focus  Medical   Weakness, limitations with ADLs secondary to demyelinating myeloneuropathy. ---completed plasmapheresis on 11/24  Improve mobility, spasms, DM/HTN  See above   Bowel/Bladder   Continent B&B LBM 11/27/  maintain b/b with mod I assist  Monitor b/b q shift and PRN   Swallow/Nutrition/ Hydration             ADL's   Min A functional ambulation and transfers; max A LB dressing; mod A UB dressing; max A toileting  Supervision-mod I overall  ADL re-training, neuro re-ed, core strengthening/ stability, functional sitting/standing balance   Mobility   min A transfers and gait  mod I overally, supervision stairs  NMR, strengthening   Communication             Safety/Cognition/ Behavioral Observations            Pain   Pain is not an issue. Pt with muscle spasms during therapy. Flexaril 5 mg TID PRN admin with moderate effectiveness.  maintain pain </=  2  Monitor pain qshift and PRN   Skin   No skin issues  skin free from iinfection/breakdown while on IPR  Monitor skin q shift and PRN    Rehab Goals Patient on target to meet rehab goals: Yes *  See Care Plan and progress notes for long and short-term goals.     Barriers to Discharge  Current Status/Progress Possible Resolutions Date Resolved   Physician    Medical stability     See above  Therapies, optimize pain meds, insulin pump      Nursing  Medical stability;Home environment access/layout               PT                    OT                  SLP                SW                Discharge  Planning/Teaching Needs:  Pt will d/c home with wife who can provide supervision as she does work from home.  Teaching needs TBD   Team Discussion:  DM under better control today.  Overall min - max assistance with ADLS and supervision to min with gait.  Goals at mod ind - supervision.    Revisions to Treatment Plan:  None    Continued Need for Acute Rehabilitation Level of Care: The patient requires daily medical management by a physician with specialized training in physical medicine and rehabilitation for the following conditions: Daily direction of a multidisciplinary physical rehabilitation program to ensure safe treatment while eliciting the highest outcome that is of practical value to the patient.: Yes Daily medical management of patient stability for increased activity during participation in an intensive rehabilitation regime.: Yes Daily analysis of laboratory values and/or radiology reports with any subsequent need for medication adjustment of medical intervention for : Neurological problems;Diabetes problems;Blood pressure problems;Other  Ivar Domangue 01/14/2017, 2:53 PM

## 2017-01-14 NOTE — Progress Notes (Signed)
Physical Therapy Note  Patient Details  Name: Kenneth Pace MRN: [id number] Date of Birth: [birth year] Today's Date: 01/14/2017  tx 1:  0800-0900, 60 min individual tx Pain:none  Bed mobility with bed features with supervision, and heavy reliance on UEs.  Squat pivot bed> R with min assist.  Squat pivot to L w/c to NuStep with mod/max assist.  Neuromuscular re-education via forced use for alternating reciprocal movement x 4 extremities at level 4 x 6 minutes, at level 2 with bil LEs only x 2 minutes, focus on bil hip neutral rotation.  Seated heel raises, R hip abduction and bil hip abduction against orange Theraband, x 10 each.   Gait training with RW x 90' on level tile with mod assist to stand, min assist to ambulate.  VCs for wider BOS as RLE adducts during swing phase, and hyperextends during stance phase. Pt left resting in w/c with all needs within reach.  tx 2:  1320-1440  Pain: none per pt  Supine neuro re-ed with multimodal cues and active assist PRN  for focused movement of trunk and LEs without use of UEs for bil lower trunk rotation, bil bridging, R/L straight leg raises, bil ankle eversion, R/L short arc quad knee ext, R/L shoulder protraction, 2 x 7 each, bil hip internal rotation 2 x 10.  Pt fatigues after 6-7 reps and needs reminders to stop and rest briefly. In R side lying: 2 x 10 clam shells for hip abduction, isolated hip extension with flexed knee.  Bed mobility for rolling L><R, scooting laterally without rails with min assist on bed, and coming to prone with mod assist.  Prone on elbows and knees with max assist for rocking forward/backward x 5.  Blocked practice for forward wt shift to elevate hips from bed, bil hands on armchair in front of him, x 5 x 2.  R/L lateral leans to work on reciprocal scooting max assist. W/c propulsion on level terrain to go to [facility] Room and make cup of coffee with supervision. Pt left resting in w/c in Family room.   See function  navigator for current status. Zeanna Sunde 01/14/2017, 7:52 AM

## 2017-01-14 NOTE — Discharge Instructions (Signed)
Inpatient Rehab Discharge Instructions  Kenneth Pace Discharge date and time:    Activities/Precautions/ Functional Status: Activity: no lifting, driving, or strenuous exercise for till cleared by MD Diet: diabetic diet Wound Care: none needed   Functional status:  ___ No restrictions     ___ Walk up steps independently ___ 24/7 supervision/assistance   ___ Walk up steps with assistance ___ Intermittent supervision/assistance  ___ Bathe/dress independently ___ Walk with walker     ___ Bathe/dress with assistance ___ Walk Independently    ___ Shower independently ___ Walk with assistance    ___ Shower with assistance ___ No alcohol     ___ Return to work/school ________  Special Instructions:    My questions have been answered and I understand these instructions. I will adhere to these goals and the provided educational materials after my discharge from the hospital.  Patient/Caregiver Signature _______________________________ Date __________  Clinician Signature _______________________________________ Date __________  Please bring this form and your medication list with you to all your follow-up doctor's appointments.

## 2017-01-14 NOTE — Evaluation (Signed)
Recreational Therapy Assessment and Plan  Patient Details  Name: Kenneth Pace MRN: [id number] Date of Birth: [birth year] Today's Date: 01/14/2017  Rehab Potential:   ELOS:     Assessment  Problem List:      Patient Active Problem List   Diagnosis Date Noted  . Type 2 diabetes mellitus with peripheral neuropathy (HCC)   . Generalized anxiety disorder   . Acute inflammatory demyelinating polyneuropathy ([place]) 01/11/2017  . Anxiety state   . Neurogenic bladder   . Depression 12/30/2016  . Leg weakness, bilateral 12/30/2016  . 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 GBS (Guillain Barre syndrome) ([place])   . AIDP (acute inflammatory demyelinating polyneuropathy) ([place]) 11/27/2016  . Weakness 11/20/2016  . Weakness of both arms 10/30/2016  . Numbness 03/18/2016  . Diabetes mellitus without complication ([place]) 61/60/7371  . Eustachian tube dysfunction 02/12/2015  . Acute maxillary sinusitis 02/12/2015  . Wellness examination 08/22/2014  . Alkaline phosphatase elevation 08/22/2014  . Screening for prostate cancer 04/24/2013  . Routine general medical examination at a health [facility] facility 04/10/2012  . BPH (benign prostatic hyperplasia) 02/03/2010  . HEMOCCULT POSITIVE STOOL 05/25/2008  . ELBOW PAIN, RIGHT 07/04/2007  . Dyslipidemia 02/16/2007  . ANXIETY 02/16/2007  . ERECTILE DYSFUNCTION 02/16/2007  . Essential hypertension 02/16/2007  . TESTICULAR MASS, LEFT 02/16/2007  . KNEE PAIN, CHRONIC 02/16/2007  . ADENOIDECTOMY, HX OF 02/16/2007    Past Medical History:      Past Medical History:  Diagnosis Date  . ADENOIDECTOMY, HX OF 02/16/2007  . Anxiety    pt. states he does not have anxiety.  Marland Kitchen BENIGN PROSTATIC HYPERTROPHY, WITH OBSTRUCTION 02/03/2010  . DIABETES MELLITUS, TYPE I, UNCONTROLLED 12/29/2006  . ERECTILE DYSFUNCTION 02/16/2007  . HYPERLIPIDEMIA 02/16/2007  . HYPERTENSION 02/16/2007  . TESTICULAR MASS, LEFT  02/16/2007   Past Surgical History:  Past Surgical History:  Procedure Laterality Date  . KNEE SURGERY     2 Lknee arthroscopy, 3 R knee arthroscpoy  . KNEE SURGERY Right 11/12/2016  . TONSILLECTOMY      Assessment & Plan Clinical Impression: Patient is a is a 66 y.o.malewith history of T2DM, OSA,,subacute inflammatory polyradiculoneuropathy treated withIVIG for lower extremity weakness10/2018 and was attending outpatient PT.History taken from chart review and patient. Patient recently returned from cruise --used Doctor, general practice get around. [name] readmitted on 12/29/16 withreports of progressive decline that started during his trip with tightness around abdomen with difficulty breathing,progressive difficulty walking with numbness, difficulty swallowing and inability to void.Has been evaluated by neurology with evidence of demyelinating neuropathy on EMG. Dr. Rory Percy recommended evaluation for polyradiculoneuropathy v/s transverse myelitis--doubt GBS. MRI thoracic spine revealed multifocal disc protrusions with cord flattening T5-T10 without significant stenosis. 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   Work up underway to rule out paraneoplastic panel.LP done yesterday revealing elevated protein 121 and WBC - 7. MRI brain reviewed, unremarkable for acute process. Per report brain/cervical spineMRI repeated and was negative foracute intracranial abnormality and multilevel moderate stenosis C3/4 and C5/6 with mild cord deformity but no signal changes. CT abdomen/pelvis done to rule out paraneoplastic syndrome and was negative for acute abnormality. Incidental findings of fairly large amount of stool in colon, thickened bladder wall --favor chronic neurogenic bladder and enlarged prostate causing mass effect on bladder.   Dr. Kathyrn Sheriff consulted and recommended trial of plasma exchange as symptoms favor AIDP but could have  component of symptomatic cervical myelopathy. If he does not show improvement to consider decompression  of cervical spondylosis. He completed 5 rounds of treatment and is showing some improvement. NIF stable and tends to have clawing right hand with fatigue. Patient transferred to CIR on 01/11/2017 .   Pt presents with decreased activity tolerance, decreased functional mobility, decreased balance, decreased coordination Limiting pt's independence with leisure/community pursuits.   Leisure History/Participation Premorbid leisure interest/current participation: Kenneth Pace;Crafts - Woodworking;Community - Grocery store;Community - Travel (Comment);Community Administrator, sports (Comment);Community - Engineer, agricultural - Vegetable gardening;Nature - Other (Comment)(restoring old cars, mr fix it,) Other Leisure Interests: Television;Reading;Computer Leisure Participation Style: With Family/Friends Awareness of Community Resources: Excellent Psychosocial / Spiritual Stress Management: Good Patient agreeable to Pet Therapy: Yes Does patient have pets?: Yes Social interaction - Mood/Behavior: Cooperative Academic librarian Appropriate for Education?: Yes Recreational Therapy Orientation Orientation -Reviewed with patient: Available activity resources Strengths/Weaknesses Patient Strengths/Abilities: Willingness to participate;Active premorbidly Patient weaknesses: Physical limitations TR Patient demonstrates impairments in the following area(s): Endurance;Motor;Skin Integrity  Plan Min 1 TR session/group >20 minutes during LOS  Recommendations for other services: None   Discharge Criteria: Patient will be discharged from TR if patient refuses treatment 3 consecutive times without medical reason.  If treatment goals not met, if there is a change in medical status, if patient makes no progress towards goals or if patient is discharged from hospital.  The above assessment, treatment plan,  treatment alternatives and goals were discussed and mutually agreed upon: by patient  Hall 01/14/2017, 11:59 AM

## 2017-01-14 NOTE — Progress Notes (Signed)
Social Work  Social Work Assessment and Plan  Patient Details  Name: TABER SWEETSER MRN: [id number] Date of Birth: [birth year]  Today's Date: 01/14/2017  Problem List:  Patient Active Problem List   Diagnosis Date Noted  . Benign essential HTN   . Labile blood glucose   . Muscle spasm   . Type 2 diabetes mellitus with peripheral neuropathy (HCC)   . Generalized anxiety disorder   . 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 Anxiety state   . Neurogenic bladder   . Depression 12/30/2016  . Leg weakness, bilateral 12/30/2016  . 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 GBS (Guillain Barre syndrome) ([place])   . AIDP (acute inflammatory demyelinating polyneuropathy) ([place]) 11/27/2016  . Weakness 11/20/2016  . Weakness of both arms 10/30/2016  . Numbness 03/18/2016  . Diabetes mellitus without complication ([place]) 81/85/6314  . Eustachian tube dysfunction 02/12/2015  . Acute maxillary sinusitis 02/12/2015  . Wellness examination 08/22/2014  . Alkaline phosphatase elevation 08/22/2014  . Screening for prostate cancer 04/24/2013  . Routine general medical examination at a health care facility 04/10/2012  . BPH (benign prostatic hyperplasia) 02/03/2010  . HEMOCCULT POSITIVE STOOL 05/25/2008  . ELBOW PAIN, RIGHT 07/04/2007  . Dyslipidemia 02/16/2007  . ANXIETY 02/16/2007  . ERECTILE DYSFUNCTION 02/16/2007  . Essential hypertension 02/16/2007  . TESTICULAR MASS, LEFT 02/16/2007  . KNEE PAIN, CHRONIC 02/16/2007  . ADENOIDECTOMY, HX OF 02/16/2007   Past Medical History:  Past Medical History:  Diagnosis Date  . ADENOIDECTOMY, HX OF 02/16/2007  . Anxiety    pt. states he does not have anxiety.  Marland Kitchen BENIGN PROSTATIC HYPERTROPHY, WITH OBSTRUCTION 02/03/2010  . DIABETES MELLITUS, TYPE I, UNCONTROLLED 12/29/2006  . ERECTILE DYSFUNCTION 02/16/2007  . HYPERLIPIDEMIA 02/16/2007  . HYPERTENSION 02/16/2007  . TESTICULAR MASS,  LEFT 02/16/2007   Past Surgical History:  Past Surgical History:  Procedure Laterality Date  . KNEE SURGERY     2 Lknee arthroscopy, 3 R knee arthroscpoy  . KNEE SURGERY Right 11/12/2016  . TONSILLECTOMY     Social History:  reports that  has never smoked. he has never used smokeless tobacco. He reports that he does not drink alcohol or use drugs.  Family / Support Systems Marital Status: Married How Long?: 40 yrs Patient Roles: Spouse, Parent Spouse/Significant Other: wife, Tyresse Jayson @ ([phone]) [phone] or ([phone]) [phone] Children: they have two sons living locally Anticipated Caregiver: Spouse, [name]  Ability/Limitations of Caregiver: Spouse works from home  Caregiver Availability: 24/7 Family Dynamics: Pt describes his wife as very involved and supportive.  Notes, "she's in charge of it all..."  Notes their sons are equally supportive but are busy with work and family demands.  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   Abuse/Neglect Abuse/Neglect Assessment Can Be Completed: Yes Physical Abuse: Denies Verbal Abuse: Denies Sexual Abuse: Denies Exploitation of patient/patient's resources: Denies Self-Neglect: Denies  Emotional Status Pt's affect, behavior adn adjustment status: Pt very pleasant and talks openly with me about his course of illness and lengthy medical search for cause/ diagnosis.  He is hopeful that he is now on "the road to recovery" and will be returning to work soon.  He denies any signficant emotional distress, however, will monitor and refer for neuropsychology  as indicated. Recent Psychosocial Issues: None Pyschiatric History: None Substance Abuse  History: None  Patient / Family Perceptions, Expectations & Goals Pt/Family understanding of illness & functional limitations: Pt and wife with very good understanding of his confirmed diagnosis of CIDP.  Wife is a Therapist, sports and pt able to provide detailed report of all of the testing that took place to confirm.  Good understanding of his current functional limitations/ need for CIR. Premorbid pt/family roles/activities: Pt was completely independent prior to 11/2016 and working f/t Anticipated changes in roles/activities/participation: Wife aware that she will need to continue to provide caregiver support.   Pt/family expectations/goals: "I just don't want to be too much on Kristy."  [employer]: None Premorbid Home Care/DME Agencies: Other (Comment)(OP therapies at [facility] ) Transportation available at discharge: yes Resource referrals recommended: Neuropsychology  Discharge Planning Living Arrangements: Spouse/significant other Support Systems: Spouse/significant other, Children, Other relatives, Friends/neighbors Type of Residence: Private residence Insurance Resources: Multimedia programmer (specify)(BCBS) Financial Resources: Employment Financial Screen Referred: No Living Expenses: [place] Does the patient have any problems obtaining your medications?: No Home Management: pt and wife Patient/Family Preliminary Plans: Pt will d/c home with wife who can provide supervision  Clinical Impression Very pleasant gentleman here following gradual decline in function since Oct with final diagnosis of CIDP.  Pt and wife have a very good understanding of the diagnosis and of current functional limitations.  Wife working from home Investment banker, corporate with [employer]).  Pt denies any significant emotional distress.  Will follow for support and d/c planning needs.  Deklyn Gibbon 01/14/2017, 4:52 PM

## 2017-01-14 NOTE — Progress Notes (Signed)
Patient with elevated BS due to pump not being reattached after his ADLs. He will bolus appropriate dose with meal  Nurse and PT report spasms limiting therapy. Will change flexeril to baclofen and schedule it  Tid. Patient does not feel that his neuropathy is limiting but would like to wait and try one medication at a time. Discussed gabapentin--will monitor for now.

## 2017-01-14 NOTE — Progress Notes (Signed)
[facility] PHYSICAL MEDICINE & REHABILITATION     PROGRESS NOTE  Subjective/Complaints:  Pt seen laying in bed this AM.  He slept well overnight.  He notes improvement in spasms with medications.  He has questions regarding discharge, prognosis, future treatment.   ROS: Denies CP, SOB, nausea, vomiting, diarrhea.  Objective: Vital Signs: Blood pressure 128/69, pulse 71, temperature 97.7 F (36.5 C), temperature source Oral, resp. rate 17, height 6' (1.829 m), weight 102.1 kg (225 lb), SpO2 97 %. No results found. Recent Labs    01/11/17 1904 01/12/17 0619  WBC 9.5 8.7  HGB 15.2 15.4  HCT 43.8 44.5  PLT 185 189   Recent Labs    01/11/17 1904 01/12/17 0619  NA 136 139  K 4.0 3.5  CL 102 106  GLUCOSE 138* 50*  BUN 11 11  CREATININE 0.75 0.75  CALCIUM 8.7* 8.9   CBG (last 3)  Recent Labs    01/13/17 1657 01/13/17 2120 01/14/17 0629  GLUCAP 71 156* 67    Wt Readings from Last 3 Encounters:  01/13/17 102.1 kg (225 lb)  01/10/17 102.4 kg (225 lb 12 oz)  11/27/16 104.3 kg (230 lb)    Physical Exam:  BP 128/69 (BP Location: Right Arm)   Pulse 71   Temp 97.7 F (36.5 C) (Oral)   Resp 17   Ht 6' (1.829 m)   Wt 102.1 kg (225 lb)   SpO2 97%   BMI 30.52 kg/m  Constitutional: He appears well-developed and well-nourished.  HENT: Normocephalic and atraumatic.  Eyes: EOM are normal. No discharge. Cardiovascular: RRR. No JVD Respiratory: Effort normal and breath sounds normal.   GI: Bowel sounds are normal. He exhibits no distension.   Musculoskeletal: He exhibits no edema or tenderness.  Neurological: He is alert and oriented.  Speech clear.  Cognition intact and able to follow commands without difficulty.  Motor: B/l UE: 5/5 proximal to distal B/l LE: HF 4-/5, KE 4/5, ADF/PF 4+/5  Sensation diminished to light touch distal to knees and elbows (stable) Skin: Skin is warm and dry.  Psychiatric: He has a normal mood and affect. His behavior is normal. Judgment  and thought content normal.   Assessment/Plan: 1. 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 Physiatrist is providing close team supervision and 24 hour management of active medical problems listed below. Physiatrist and rehab team continue to assess barriers to discharge/monitor patient progress toward functional and medical goals.  Function:  Bathing Bathing position   Position: Shower  Bathing parts Body parts bathed by patient: Right arm, Right upper leg, Left arm, Left upper leg, Abdomen, Right lower leg, Chest, Front perineal area, Left lower leg Body parts bathed by helper: Buttocks, Back  Bathing assist Assist Level: Touching or steadying assistance(Pt > 75%)      Upper Body Dressing/Undressing Upper body dressing   What is the patient wearing?: Pull over shirt/dress     Pull over shirt/dress - Perfomed by patient: Thread/unthread right sleeve, Thread/unthread left sleeve, Put head through opening Pull over shirt/dress - Perfomed by helper: Pull shirt over trunk        Upper body assist        Lower Body Dressing/Undressing Lower body dressing   What is the patient wearing?: Underwear, Pants, Shoes, Socks   Underwear - Performed by helper: Thread/unthread right underwear leg, Thread/unthread left underwear leg, Pull underwear up/down   Pants- Performed  by helper: Thread/unthread right pants leg, Thread/unthread left pants leg, Pull pants up/down       Socks - Performed by helper: Don/doff right sock, Don/doff left sock   Shoes - Performed by helper: Don/doff right shoe, Don/doff left shoe          Lower body assist Assist for lower body dressing: Touching or steadying assistance (Pt > 75%)      Toileting Toileting   Toileting steps completed by patient: Adjust clothing prior to toileting Toileting steps completed by helper: Adjust  clothing after toileting Toileting Assistive Devices: Grab bar or rail  Toileting assist Assist level: More than reasonable time, Set up/obtain supplies   Transfers Chair/bed transfer   Chair/bed transfer method: Ambulatory Chair/bed transfer assist level: Touching or steadying assistance (Pt > 75%) Chair/bed transfer assistive device: Armrests, Medical sales representative     Max distance: 75 Assist level: Touching or steadying assistance (Pt > 75%)   Wheelchair   Type: Manual Max wheelchair distance: 50 Assist Level: Touching or steadying assistance (Pt > 75%)  Cognition Comprehension Comprehension assist level: Follows basic conversation/direction with extra time/assistive device  Expression Expression assist level: Expresses basic needs/ideas: With extra time/assistive device  Social Interaction Social Interaction assist level: Interacts appropriately with others with medication or extra time (anti-anxiety, antidepressant).  Problem Solving Problem solving assist level: Solves complex problems: With extra time  Memory Memory assist level: Recognizes or recalls 90% of the time/requires cueing < 10% of the time    Medical Problem List and Plan: 1.  Weakness, limitations with ADLs secondary to demyelinating myeloneuropathy. ---completed plasmapheresis on 11/24   Cont CIR 2.  DVT Prophylaxis/Anticoagulation: Pharmaceutical: Lovenox 3. Pain Management:    Flexaril PRN for muscle spasms with improvement 4. Mood: Remains optimistic. LCSW to follow for evaluation and support.  5. Neuropsych: This patient is capable of making decisions on his own behalf. 6. Skin/Wound Care: Routine pressure relief measures. Maintain adequate nutritional and hydration status.  7. Fluids/Electrolytes/Nutrition: Monitor I/Os. Added nutritional supplement to help promote healing and for BS stabilization.  8. Demyelinating polyneuropathy: See above 9.T2DM: Hgb A1c- 7.3 (managed by Dr. Loanne Drilling)  Monitor BS ac/hs. Has insulin pump with monitor.    Anticipate labile blood sugars due to recent malfunction of pump.    Improving overall on 11/29   Monitor with increased mobility 10. BPH/Neurogenic bladder: On finasteride in am.  11. HTN: Monitor BP bid. Continue lisinopril   Controlled on 11/29 12. Anxiety disorder: Due to work stress and has been managed with buspar.  13. Constipation: Resolved with bowel regimen of miralax bid.  LOS (Days) 3 A FACE TO FACE EVALUATION WAS PERFORMED  Robi Dewolfe Lorie Phenix 01/14/2017 8:36 AM

## 2017-01-15 ENCOUNTER — Inpatient Hospital Stay (HOSPITAL_COMMUNITY): Payer: BLUE CROSS/BLUE SHIELD | Admitting: Physical Therapy

## 2017-01-15 ENCOUNTER — Inpatient Hospital Stay (HOSPITAL_COMMUNITY): Payer: BLUE CROSS/BLUE SHIELD | Admitting: Occupational Therapy

## 2017-01-15 LAB — GLUCOSE, CAPILLARY
GLUCOSE-CAPILLARY: 101 mg/dL — AB (ref 65–99)
GLUCOSE-CAPILLARY: 100 mg/dL — AB (ref 65–99)
Glucose-Capillary: 112 mg/dL — ABNORMAL HIGH (ref 65–99)
Glucose-Capillary: 146 mg/dL — ABNORMAL HIGH (ref 65–99)

## 2017-01-15 MED ORDER — CYCLOBENZAPRINE HCL 10 MG PO TABS
10.0000 mg | ORAL_TABLET | Freq: Three times a day (TID) | ORAL | Status: DC | PRN
  Administered 2017-01-15 – 2017-01-29 (×4): 10 mg via ORAL
  Filled 2017-01-15 (×6): qty 1

## 2017-01-15 NOTE — Progress Notes (Signed)
Inpatient Diabetes Program Recommendations  AACE/ADA: New Consensus Statement on Inpatient Glycemic Control (2015)  Target Ranges:  Prepandial:   less than 140 mg/dL      Peak postprandial:   less than 180 mg/dL (1-2 hours)      Critically ill patients:  140 - 180 mg/dL   Results for Kenneth Pace, Kenneth Pace (MRN [id number]) as of 01/15/2017 15:10  Ref. Range 01/15/2017 06:23 01/15/2017 12:05  Glucose-Capillary Latest Ref Range: 65 - 99 mg/dL 112 (H) 101 (H)    Home DM Meds: Insulin Pump  Current Insulin Orders: Insulin Pump     Met with patient today at bedside.  Pt A&O and able to independently manage insulin pump.  Extra pump supplies available at bedside.  Patient stated he will change his insulin reservoir today and will change his set/site tomorrow (12/01).  Currently wearing sensor as well.  See below for pump settings:   --Insulin Pump Settings--  Basal Rates: MN to 6am- 2.75 units/hr 6am-MN- 3.0 units/hr  Total Basal Insulin per 24 hours period= 70.5 units  Carbohydrate Ratio: 1 unit for every 12-15 Grams of Carbohydrates  Correction/Sensitivity Factor: 1 unit for every 50 mg/dl above Target CBG  Target CBG: 100 mg/dl       --Will follow patient during hospitalization--  Wyn Quaker RN, MSN, CDE Diabetes Coordinator Inpatient Glycemic Control Team Team Pager: [phone] (8a-5p)

## 2017-01-15 NOTE — Care Management Note (Signed)
Inpatient [facility] Individual Statement of Services  Patient Name:  Kenneth Pace  Date:  01/15/2017  Welcome to the [facility].  Our goal is to provide you with an individualized program based on your diagnosis and situation, designed to meet your specific needs.  With this comprehensive rehabilitation program, you will be expected to participate in at least 3 hours of rehabilitation therapies Monday-Friday, with modified therapy programming on the weekends.  Your rehabilitation program will include the following services:  Physical Therapy (PT), Occupational Therapy (OT), 24 hour per day rehabilitation nursing, Therapeutic Recreaction (TR), Neuropsychology, Case Management (Social Worker), Rehabilitation Medicine, Nutrition Services and Pharmacy Services  Weekly team conferences will be held on Wednesdays to discuss your progress.  Your Social Worker will talk with you frequently to get your input and to update you on team discussions.  Team conferences with you and your family in attendance may also be held.  Expected length of stay: 12-14 days    Overall anticipated outcome: modified independent  Depending on your progress and recovery, your program may change. Your Social Worker will coordinate services and will keep you informed of any changes. Your Social Worker's name and contact numbers are listed  below.  The following services may also be recommended but are not provided by the [facility] will be made to provide these services after discharge if needed.  Arrangements include referral to agencies that provide these services.  Your insurance has been verified to be:  [place] Your primary doctor is:  Loanne Drilling  Pertinent information will be shared with your doctor and your  insurance company.  Social Worker:  [place], [place] or ([phone]   Information discussed with and copy given to patient by: Lennart Pall, 01/14/2017, 2:41 PM

## 2017-01-15 NOTE — Progress Notes (Signed)
Occupational Therapy Session Note  Patient Details  Name: Kenneth Pace MRN: [id number] Date of Birth: 01-01-1951  Today's Date: 01/15/2017 OT Individual Time: 9458-5929 OT Individual Time Calculation (min): 45 min    Short Term Goals: Week 1:  OT Short Term Goal 1 (Week 1): Pt will complete 2/3 toileting tasks with steadying assist OT Short Term Goal 2 (Week 1): Pt will don shirt with set-up OT Short Term Goal 3 (Week 1): Pt will don pants and socks using AE PRN with min A OT Short Term Goal 4 (Week 1): Pt will complete grooming task in standing with CGA for balance  Skilled Therapeutic Interventions/Progress Updates:    Pt seen for OT session focusing on functional standing balance/endurance. Pt sitting up in w/c upon arrival, agreeable to tx session. Completed dynamic standing activity requiring him to reach outside BOS and across midline to place horseshoes on basketball rim. Required one UE support on RW to complete dynamic task with min- occasional mod A for balance. Completed x3 trials, pt tolerating ~3-4 minutes each trial before reuqiring seated rest break. Pt with increase in spasms and requiring increased assist for balance and sit <> stand once fatigued. When unsafe to continue in standing, completed task from seated position with pt required to reach to floor to obtain item and place overhead. This movement increased pt's spasms. Educated pt on importance of communicating with caregivers what elicits spasms, etc.  Pt left seated in w/c at end of session with hand off to PT. Educarted regarding importance of quality vs quantity with exercises.   Therapy Documentation Precautions:  Precautions Precautions: Fall Restrictions Weight Bearing Restrictions: No Pain:   No/ denies pain despite spasms  See Function Navigator for Current Functional Status.   Therapy/Group: Individual Therapy  Lewis, Kylin Genna C 01/15/2017, 6:45 AM

## 2017-01-15 NOTE — Progress Notes (Signed)
Kenneth Pace PHYSICAL MEDICINE & REHABILITATION     PROGRESS NOTE  Subjective/Complaints:  Sugars remain labile.  He tells me he got Jell-O with sugar instead of the sugar-free type last night which caused his sugars to be high.  Otherwise he feels that he is doing well.  He is trying to fight through weakness and sensory problems in his legs  ROS: pt denies nausea, vomiting, diarrhea, cough, shortness of breath or chest pain       Objective: Vital Signs: Blood pressure (!) 114/56, pulse 74, temperature (!) 97.5 F (36.4 C), temperature source Oral, resp. rate 18, height 6' (1.829 m), weight 102.1 kg (225 lb), SpO2 96 %. No results found. No results for input(s): WBC, HGB, HCT, PLT in the last 72 hours. No results for input(s): NA, K, CL, GLUCOSE, BUN, CREATININE, CALCIUM in the last 72 hours.  Invalid input(s): CO CBG (last 3)  Recent Labs    01/14/17 1632 01/14/17 2116 01/15/17 0623  GLUCAP 137* 303* 112*    Wt Readings from Last 3 Encounters:  01/13/17 102.1 kg (225 lb)  01/10/17 102.4 kg (225 lb 12 oz)  11/27/16 104.3 kg (230 lb)    Physical Exam:  BP (!) 114/56 (BP Location: Left Arm)   Pulse 74   Temp (!) 97.5 F (36.4 C) (Oral)   Resp 18   Ht 6' (1.829 m)   Wt 102.1 kg (225 lb)   SpO2 96%   BMI 30.52 kg/m  Constitutional: He appears well-developed and well-nourished.  HENT: Normocephalic and atraumatic.  Eyes: EOM are normal. No discharge. Cardiovascular: RRR without murmur. No JVD  Respiratory: CTA Bilaterally without wheezes or rales. Normal effort    GI: Bowel sounds are normal. He exhibits no distension.   Musculoskeletal: He exhibits no edema or tenderness.  Neurological: He is alert and oriented.  Speech clear.  Cognition intact and able to follow commands without difficulty.  Motor: B/l UE: 5/5 proximal to distal B/l LE: HF 2+ to 3/5, KE 3+/5, ADF/PF 4+/5  Sensation diminished to light touch distal to knees and elbows--generally stocking glove but  some patchiness in his distribution proximally. Skin: Skin is warm and dry.  Psychiatric: He has a normal mood and affect. His behavior is normal. Judgment and thought content normal.   Assessment/Plan: 1. 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 Physiatrist is providing close team supervision and 24 hour management of active medical problems listed below. Physiatrist and rehab team continue to assess barriers to discharge/monitor patient progress toward functional and medical goals.  Function:  Bathing Bathing position   Position: Shower  Bathing parts Body parts bathed by patient: Right arm, Right upper leg, Left arm, Left upper leg, Abdomen, Right lower leg, Chest, Front perineal area, Left lower leg, Buttocks Body parts bathed by helper: Back  Bathing assist Assist Level: Touching or steadying assistance(Pt > 75%)      Upper Body Dressing/Undressing Upper body dressing   What is the patient wearing?: Pull over shirt/dress     Pull over shirt/dress - Perfomed by patient: Thread/unthread right sleeve, Thread/unthread left sleeve, Put head through opening, Pull shirt over trunk Pull over shirt/dress - Perfomed by helper: Pull shirt over trunk        Upper body assist Assist Level: Supervision or verbal cues      Lower Body Dressing/Undressing Lower body dressing   What is the patient wearing?: Underwear,  Pants, Shoes, Socks Underwear - Performed by patient: Thread/unthread right underwear leg, Thread/unthread left underwear leg, Pull underwear up/down Underwear - Performed by helper: Thread/unthread right underwear leg, Thread/unthread left underwear leg, Pull underwear up/down Pants- Performed by patient: Thread/unthread right pants leg, Thread/unthread left pants leg, Pull pants up/down Pants- Performed by helper: Thread/unthread right pants leg,  Thread/unthread left pants leg, Pull pants up/down     Socks - Performed by patient: Don/doff right sock, Don/doff left sock(Sock aid) Socks - Performed by helper: Don/doff right sock, Don/doff left sock Shoes - Performed by patient: Don/doff right shoe, Don/doff left shoe Shoes - Performed by helper: Don/doff right shoe, Don/doff left shoe          Lower body assist Assist for lower body dressing: Touching or steadying assistance (Pt > 75%)      Toileting Toileting   Toileting steps completed by patient: Adjust clothing prior to toileting Toileting steps completed by helper: Adjust clothing after toileting Toileting Assistive Devices: Grab bar or rail  Toileting assist Assist level: More than reasonable time, Set up/obtain supplies   Transfers Chair/bed transfer   Chair/bed transfer method: Squat pivot Chair/bed transfer assist level: Moderate assist (Pt 50 - 74%/lift or lower) Chair/bed transfer assistive device: Armrests     Locomotion Ambulation     Max distance: 75 Assist level: Touching or steadying assistance (Pt > 75%)   Wheelchair   Type: Manual Max wheelchair distance: 50 Assist Level: Touching or steadying assistance (Pt > 75%)  Cognition Comprehension Comprehension assist level: Follows basic conversation/direction with extra time/assistive device  Expression Expression assist level: Expresses basic needs/ideas: With extra time/assistive device  Social Interaction Social Interaction assist level: Interacts appropriately with others with medication or extra time (anti-anxiety, antidepressant).  Problem Solving Problem solving assist level: Solves complex problems: With extra time  Memory Memory assist level: Recognizes or recalls 90% of the time/requires cueing < 10% of the time    Medical Problem List and Plan: 1.  Weakness, limitations with ADLs secondary to demyelinating myeloneuropathy. ---completed plasmapheresis on 11/24   Cont CIR-remains  motivated 2.  DVT Prophylaxis/Anticoagulation: Pharmaceutical: Lovenox 3. Pain Management:    Flexeril PRN for muscle spasms with improvement 4. Mood: Remains optimistic. LCSW to follow for evaluation and support.  5. Neuropsych: This patient is capable of making decisions on his own behalf. 6. Skin/Wound Care: Routine pressure relief measures. Maintain adequate nutritional and hydration status.  7. Fluids/Electrolytes/Nutrition: Monitor I/Os. Added nutritional supplement to help promote healing and for BS stabilization.  9.T2DM: Hgb A1c- 7.3 (managed by Dr. Loanne Drilling) Monitor BS ac/hs. Has insulin pump with monitor.     labile blood sugars, recent issues with pump, diet, he's adjusting pump   Improving overall on 11/30   Monitor with increased mobility 10. BPH/Neurogenic bladder: On finasteride in am.  11. HTN: Monitor BP bid. Continue lisinopril   Controlled on 11/30 12. Anxiety disorder: Due to work stress and has been managed with buspar.  13. Constipation: Resolved with bowel regimen of miralax bid.  LOS (Days) 4 A FACE TO FACE EVALUATION WAS PERFORMED  Shiara Mcgough T 01/15/2017 9:22 AM

## 2017-01-15 NOTE — Progress Notes (Addendum)
Physical Therapy Note  Patient Details  Name: Kenneth Pace MRN: [id number] Date of Birth: Nov 10, 1950 Today's Date: 01/15/2017    Time: [phone] 60 minutes  1:1 No c/o pain.  W/c mobility with bilat UEs x 100' with supervision, increased time due to fatigue.  Gait with RW with mod A to stand, min A for gait x 30'.  Sit to stand from progressively lower surfaces with focus on only 1 UE support to increase LE wt bearing.  Seated balance with core rotations and shoulder AROM with tactile cues for back and core strengthening and activation.  nustep x 6 minutes level 4 for UE/LE strength and coordination. Pt left in room with needs at hand.   Time 2: 1115-1130 15 minutes  1:1 No c/o pain. Pt c/o fatigue from previous OT session and states he will be unable to participate in this PT session.  PT assists pt back to bed with mod A for squat pivot transfer to bed and mod A for sit to supine.  Total A to doff shoes.  Pt positioned for comfort in bed and left with needs at hand.  Time 3: 1445-1511 26 minutes  1:1 No c/o pain. Pt having moderate mm spasms on PT arrival.  Attempt to stand with RW, pt unable due to spasms.  Squat/scoot pivot transfer to w/c and to/from toilet with min A.  Sustained squats for clothing management and hygiene with min A.  Pt fatigues quickly with transfers and squats but is motivated to perform clothing management and hygiene without assist.  Pt left in bed with RN aware of increased spasms this session.  Syrah Daughtrey 01/15/2017, 10:19 AM

## 2017-01-15 NOTE — Progress Notes (Signed)
Occupational Therapy Session Note  Patient Details  Name: Kenneth Pace MRN: [id number] Date of Birth: April 16, 1950  Today's Date: 01/15/2017 OT Individual Time: 0830-0900 OT Individual Time Calculation (min): 30 min    Short Term Goals: Week 1:  OT Short Term Goal 1 (Week 1): Pt will complete 2/3 toileting tasks with steadying assist OT Short Term Goal 2 (Week 1): Pt will don shirt with set-up OT Short Term Goal 3 (Week 1): Pt will don pants and socks using AE PRN with min A OT Short Term Goal 4 (Week 1): Pt will complete grooming task in standing with CGA for balance      Skilled Therapeutic Interventions/Progress Updates:    Pt received in bed stating he was already bathed last night and had clean clothing on.  Discussed his spasms that inhibit his movement.  Used this session to teach patient self ROM, stretches, tone inhibiting movements to relax his muscles to prepare for mobility.  Pt did need A to get into position but then was able to follow through with stretches.  Pt worked from supine on bent knee sways to relax lower back, hip to chest and figure 4 for low back and hips, hamstring with use of long sheet to support leg, and ITB with crossing leg across body.  Pt then worked on int/ external rotation of legs. Worked on rolling to L side on flat bed and pushing up with arms with min A.  Sitting at EOB, worked on abd/add of hips. Pt then stood to RW with min A, held balance well and stepped toward w/c with min A and no spasms.  Pt resting in w/c with all needs met.  Encouraged pt to try to do some of the stretches in bed in the am before his therapy sessions.  Therapy Documentation Precautions:  Precautions Precautions: Fall Restrictions Weight Bearing Restrictions: No   Pain: Pain Assessment Pain Assessment: No/denies pain    See Function Navigator for Current Functional Status.   Therapy/Group: Individual Therapy  [place] 01/15/2017, 12:06 PM

## 2017-01-15 NOTE — Progress Notes (Signed)
Rt went to place pt on CPAP.  PT has home CPAP at bedside.

## 2017-01-16 ENCOUNTER — Inpatient Hospital Stay (HOSPITAL_COMMUNITY): Payer: BLUE CROSS/BLUE SHIELD | Admitting: Occupational Therapy

## 2017-01-16 LAB — GLUCOSE, CAPILLARY
GLUCOSE-CAPILLARY: 143 mg/dL — AB (ref 65–99)
GLUCOSE-CAPILLARY: 94 mg/dL (ref 65–99)
GLUCOSE-CAPILLARY: 210 mg/dL — AB (ref 65–99)
Glucose-Capillary: 162 mg/dL — ABNORMAL HIGH (ref 65–99)
Glucose-Capillary: 102 mg/dL — ABNORMAL HIGH (ref 65–99)

## 2017-01-16 MED ORDER — GABAPENTIN 100 MG PO CAPS
100.0000 mg | ORAL_CAPSULE | Freq: Three times a day (TID) | ORAL | Status: DC
  Administered 2017-01-16 – 2017-01-18 (×6): 100 mg via ORAL
  Filled 2017-01-16 (×6): qty 1

## 2017-01-16 NOTE — Progress Notes (Signed)
Kenneth Pace is a 66 y.o. male Jun 26, 1950 [id number]  Subjective: C/o electric like burning shooting pain down B LEs. Slept well. Feeling OK otherwise.  Objective: Vital signs in last 24 hours: Temp:  [97.5 F (36.4 C)-98.9 F (37.2 C)] 97.5 F (36.4 C) (12/01 0223) Pulse Rate:  [72-89] 72 (12/01 0223) Resp:  [17] 17 (12/01 0223) BP: (117-135)/(61-67) 121/61 (12/01 0223) SpO2:  [96 %-97 %] 96 % (12/01 0223) Weight:  [224 lb 12.8 oz (102 kg)] 224 lb 12.8 oz (102 kg) (12/01 0223) Weight change:  Last BM Date: 01/15/17  Intake/Output from previous day: 11/30 0701 - 12/01 0700 In: 960 [P.O.:960] Out: -  Last cbgs: CBG (last 3)  Recent Labs    01/16/17 0218 01/16/17 0646 01/16/17 1140  GLUCAP 162* 102* 143*     Physical Exam General: No apparent distress. In bed.   HEENT: not dry Lungs: Normal effort. Lungs clear to auscultation, no crackles or wheezes. Cardiovascular: Regular rate and rhythm, no edema Abdomen: S/NT/ND; BS(+) Musculoskeletal:  unchanged Neurological: No new neurological deficits Wounds: N/A    Skin: clear   Mental state: Alert, oriented, cooperative    Lab Results: BMET    Component Value Date/Time   NA 139 01/12/2017 0619   K 3.5 01/12/2017 0619   CL 106 01/12/2017 0619   CO2 26 01/12/2017 0619   GLUCOSE 50 (L) 01/12/2017 0619   BUN 11 01/12/2017 0619   CREATININE 0.75 01/12/2017 0619   CALCIUM 8.9 01/12/2017 0619   GFRNONAA >60 01/12/2017 0619   GFRAA >60 01/12/2017 0619   CBC    Component Value Date/Time   WBC 8.7 01/12/2017 0619   RBC 5.06 01/12/2017 0619   HGB 15.4 01/12/2017 0619   HCT 44.5 01/12/2017 0619   PLT 189 01/12/2017 0619   MCV 87.9 01/12/2017 0619   MCH 30.4 01/12/2017 0619   MCHC 34.6 01/12/2017 0619   RDW 13.5 01/12/2017 0619   LYMPHSABS 1.7 01/12/2017 0619   MONOABS 0.7 01/12/2017 0619   EOSABS 0.3 01/12/2017 0619   BASOSABS 0.0 01/12/2017 0619    Studies/Results: No results found.  Medications:  I have reviewed the patient's current medications.  Assessment/Plan:   1. Demyelinating myeloneuropathy. ---completed plasmapheresis on 11/24. CIR: OT/PT 2. DVT proph - Lovenox 3. Electric like burning shooting pain down B LEs. ?neuropathic. Will add Gabapentin 4. DM. Insulin pump. 5. BPH - Finasteride 6. HTN - Lisinopril 7. Constipation: Miralax prn        Length of stay, days: 5  Walker Kehr , MD 01/16/2017, 12:54 PM

## 2017-01-16 NOTE — Progress Notes (Signed)
Patient has home CPAP and places himself on and off machine. Will call if any assistance needed

## 2017-01-17 ENCOUNTER — Inpatient Hospital Stay (HOSPITAL_COMMUNITY): Payer: BLUE CROSS/BLUE SHIELD | Admitting: Physical Therapy

## 2017-01-17 ENCOUNTER — Inpatient Hospital Stay (HOSPITAL_COMMUNITY): Payer: BLUE CROSS/BLUE SHIELD | Admitting: Occupational Therapy

## 2017-01-17 LAB — GLUCOSE, CAPILLARY
Glucose-Capillary: 62 mg/dL — ABNORMAL LOW (ref 65–99)
Glucose-Capillary: 86 mg/dL (ref 65–99)
Glucose-Capillary: 111 mg/dL — ABNORMAL HIGH (ref 65–99)
Glucose-Capillary: 97 mg/dL (ref 65–99)
Glucose-Capillary: 155 mg/dL — ABNORMAL HIGH (ref 65–99)

## 2017-01-17 NOTE — Progress Notes (Signed)
Patient has home CPAP beside and ready for use.  Patient will place on himself when ready for bed and does not need any assistance.

## 2017-01-17 NOTE — Progress Notes (Signed)
Kenneth Pace is a 66 y.o. male Sep 21, 1950 [id number]  Subjective: No new complaints. No new problems. Slept well. Feeling OK.  Continues to complain of shooting electricity-like pain down the lower extremities.  They come in spells.  Gabapentin that we started yesterday has not helped yet.  Objective: Vital signs in last 24 hours: Temp:  [97.9 F (36.6 C)-99 F (37.2 C)] 99 F (37.2 C) (12/02 1454) Pulse Rate:  [71-82] 77 (12/02 1454) Resp:  [18] 18 (12/02 1454) BP: (110-131)/(70-74) 110/73 (12/02 1454) SpO2:  [99 %-100 %] 99 % (12/02 1454) Weight:  [220 lb 14.4 oz (100.2 kg)] 220 lb 14.4 oz (100.2 kg) (12/02 0329) Weight change: -14.4 oz (-1.769 kg) Last BM Date: 01/17/17  Intake/Output from previous day: 12/01 0701 - 12/02 0700 In: 720 [P.O.:720] Out: -  Last cbgs: CBG (last 3)  Recent Labs    01/17/17 0439 01/17/17 0659 01/17/17 1157  GLUCAP 86 111* 97     Physical Exam General: No apparent distress   HEENT: not dry Lungs: Normal effort. Lungs clear to auscultation, no crackles or wheezes. Cardiovascular: Regular rate and rhythm, no edema Abdomen: S/NT/ND; BS(+) Musculoskeletal:  unchanged Neurological: No new neurological deficits Wounds: N/A    Skin: clear  Aging changes Mental state: Alert, oriented, cooperative    Lab Results: BMET    Component Value Date/Time   NA 139 01/12/2017 0619   K 3.5 01/12/2017 0619   CL 106 01/12/2017 0619   CO2 26 01/12/2017 0619   GLUCOSE 50 (L) 01/12/2017 0619   BUN 11 01/12/2017 0619   CREATININE 0.75 01/12/2017 0619   CALCIUM 8.9 01/12/2017 0619   GFRNONAA >60 01/12/2017 0619   GFRAA >60 01/12/2017 0619   CBC    Component Value Date/Time   WBC 8.7 01/12/2017 0619   RBC 5.06 01/12/2017 0619   HGB 15.4 01/12/2017 0619   HCT 44.5 01/12/2017 0619   PLT 189 01/12/2017 0619   MCV 87.9 01/12/2017 0619   MCH 30.4 01/12/2017 0619   MCHC 34.6 01/12/2017 0619   RDW 13.5 01/12/2017 0619   LYMPHSABS 1.7  01/12/2017 0619   MONOABS 0.7 01/12/2017 0619   EOSABS 0.3 01/12/2017 0619   BASOSABS 0.0 01/12/2017 0619    Studies/Results: No results found.  Medications: I have reviewed the patient's current medications.  Assessment/Plan:    1.  Demyelinating myeloneuropathy.  Status post plasmapheresis.  Continue with occupational and physical therapy. 2.  DVT prophylaxis with Lovenox. 3. Continues to complain of shooting electricity-like pain down the lower extremities.  They come in spells.  Gabapentin that we started yesterday has not helped yet.  We may need to increase gabapentin dose. 4.  Insulin-dependent diabetes.  The patient is on insulin pump. 5.  Benign prostate hypertrophy.  Continue with finasteride. 6.  Hypertension.  Continue with oral lisinopril. 7.  Constipation.  Continue to use MiraLAX as needed.     Length of stay, days: 6  Walker Kehr , MD 01/17/2017, 8:45 PM

## 2017-01-17 NOTE — Plan of Care (Signed)
Pt's medication being adjusted for neurological pain Pt is continent of bladder

## 2017-01-17 NOTE — Progress Notes (Addendum)
Physical Therapy Note  Patient Details  Name: Kenneth Pace MRN: [id number] Date of Birth: 1950/09/30 Today's Date: 01/17/2017    Time: [phone] 57 minutes  1:1 No c/o pain. Pt with several bouts of severe mm spasms during session but willing to push through.  Pt able to perform squat pivot transfers throughout session with min A.  Sit to stand with mod A and RW.  Gait x 6' with RW with mod A, pt unable to lift LEs, had to slide feet using core and UE mm due to LE mm fatigue.  Supine NMR with focus on proximal and core mm strengthening: LTR, ,hip IR/ER in hooklying, with feet on theraball hip/knee flex/ext, SAQ, heel slides with maxi slide.  Pt requires assist for all motions and frequent rests due to fatigue.  Supine to sit with mod A from mat.  Pt left in bed with needs at hand. Pt very motivated, limited by mm fatigue and spasms.   Time 2: 1257-1326 29 minutes  1:1 No c/o pain.  Bed mobility with supervision, increased time and use of bed rails.  Squat pivot transfers and sit to stand with min A during session.  Gait with RW and min A 25', 10' with heavy use of UEs, limited hip and knee flexion during gait.  nustep x 7 minutes for UE/LE strength and endurance at level 4.  Pt requires mod A for squat pivot transfer after nustep due to mm fatigue.  Pt left in room with needs at hand. DONAWERTH,KAREN 01/17/2017, 10:03 AM

## 2017-01-17 NOTE — Progress Notes (Addendum)
Occupational Therapy Session Note  Patient Details  Name: KALONJI ZURAWSKI MRN: [id number] Date of Birth: 01-Jan-1951  Today's Date: 01/16/2017 OT Individual Time:8:40-9:40  OT Individual Time Calculation (min): 60 min    Skilled Therapeutic Interventions/Progress Updates: patient participated in skilled OT therapy to help in crease safety and independence in bathing and dressing in his room shower as follows:  Bed to/from w/c transfer = close S for squat pivot;   W/c to/fr shower bench transfer= close S       UB bathing and dressing = setup;     LB bathing = Min A for feet - unable to flex hips enough and maintain balance on the seat for this task (patient would be more indepenent utilizing his brush at home or a long handle sponge);    He was able to stand at sink with close S to complete oral care and use bilateral hands for setup   Lower body dressingon edge of his bed) (except no shoes or socks as he immediately stated he needed to rest back into his bed for a bit)  = overall CGA.   Patient was able to utilize his reacher for donning underwear and pants  Patient was left in bed with his call bell and phone within reach     Therapy Documentation Precautions:  Precautions Precautions: Fall Restrictions Weight Bearing Restrictions: No   Pain:denied    See Function Navigator for Current Functional Status.   Therapy/Group: Individual Therapy  Alfredia Ferguson [facility] 01/17/2017, 1:22 PM

## 2017-01-17 NOTE — Progress Notes (Signed)
Occupational Therapy Session Note  Patient Details  Name: Kenneth Pace MRN: [id number] Date of Birth: 1950/11/22  Today's Date: 01/17/2017 OT Individual Time: [phone] and 1194-1740 OT Individual Time Calculation (min): 60 min and 27 min  Short Term Goals: Week 1:  OT Short Term Goal 1 (Week 1): Pt will complete 2/3 toileting tasks with steadying assist OT Short Term Goal 2 (Week 1): Pt will don shirt with set-up OT Short Term Goal 3 (Week 1): Pt will don pants and socks using AE PRN with min A OT Short Term Goal 4 (Week 1): Pt will complete grooming task in standing with CGA for balance  Skilled Therapeutic Interventions/Progress Updates:    Tx focus on balance, NMR, and standing tolerance during functional tasks.   Pt greeted supine in bed, declining B/D but eager for therapy. He ambulated with RW and Min A to sink to complete oral care/grooming tasks, and consume medication from RN. Pt standing with heavy UE reliance on sink. He was then escorted to dayroom. Had him engage in PVC activity with focus on standing without bilateral UEs in order to strengthen LEs. Mod cues for upright alignment instead of leaning onto table. Instructed him on intermittent hand stretches due to UE cramping/rigidity. Pt refers to this as "clawing." Longest standing time without rest 9 minutes. He took multiple seated rest breaks due to LE fatigue/pain. At end of tx he was escorted back to room and left with all needs within reach.   2nd Session 1:1 tx (27 min) Tx focus on UB strengthening, NMR, and activity tolerance during functional tasks.   Pt greeted in w/c. Reported feeling very fatigued from previous therapy session. Agreeable to light tx. He self propelled in w/c to family room with extra time due to bilateral UE cramping/sensation abnormalities- "It's the feeling like after you hit your funny bone." He required 2 rest breaks. Pt preparing condiments for coffee with extra time for opening packages.  He then self propelled back to room, including over carpeted areas. He required cues to not hold his breath or overexert himself. Once back in room, pt agreeable to remain in w/c instead of going back to bed. He was left with all needs within reach at session exit.   Therapy Documentation Precautions:  Precautions Precautions: Fall Restrictions Weight Bearing Restrictions: No General: General PT Missed Treatment Reason: Patient fatigue   Pain: LEs, RN notified and administered medication during tx. Rest breaks provided throughout sessions    ADL:   See Function Navigator for Current Functional Status.   Therapy/Group: Individual Therapy  Lya Holben A Etola Mull 01/17/2017, 12:07 PM

## 2017-01-17 NOTE — Progress Notes (Signed)
Patient wants his blood sugar checked 30 minutes after eating 15 grams of carbs. ( Previous blood sugar reading  of 62 and 86 reading on his insulin pump.) Rechecked blood sugar at 439 am with reading of 86.

## 2017-01-18 ENCOUNTER — Inpatient Hospital Stay (HOSPITAL_COMMUNITY): Payer: BLUE CROSS/BLUE SHIELD | Admitting: Physical Therapy

## 2017-01-18 ENCOUNTER — Inpatient Hospital Stay (HOSPITAL_COMMUNITY): Payer: BLUE CROSS/BLUE SHIELD

## 2017-01-18 ENCOUNTER — Telehealth: Payer: Self-pay | Admitting: Neurology

## 2017-01-18 ENCOUNTER — Inpatient Hospital Stay (HOSPITAL_COMMUNITY): Payer: BLUE CROSS/BLUE SHIELD | Admitting: Occupational Therapy

## 2017-01-18 DIAGNOSIS — M792 Neuralgia and neuritis, unspecified: Secondary | ICD-10-CM

## 2017-01-18 LAB — BASIC METABOLIC PANEL
ANION GAP: 7 (ref 5–15)
BUN: 20 mg/dL (ref 6–20)
CALCIUM: 9 mg/dL (ref 8.9–10.3)
CO2: 26 mmol/L (ref 22–32)
CREATININE: 0.82 mg/dL (ref 0.61–1.24)
Chloride: 102 mmol/L (ref 101–111)
GLUCOSE: 154 mg/dL — AB (ref 65–99)
Potassium: 3.7 mmol/L (ref 3.5–5.1)
Sodium: 135 mmol/L (ref 135–145)

## 2017-01-18 LAB — CBC
HCT: 41.9 % (ref 39.0–52.0)
HEMOGLOBIN: 14.5 g/dL (ref 13.0–17.0)
MCH: 30.5 pg (ref 26.0–34.0)
MCHC: 34.6 g/dL (ref 30.0–36.0)
MCV: 88 fL (ref 78.0–100.0)
PLATELETS: 219 10*3/uL (ref 150–400)
RBC: 4.76 MIL/uL (ref 4.22–5.81)
RDW: 13 % (ref 11.5–15.5)
WBC: 8.1 10*3/uL (ref 4.0–10.5)

## 2017-01-18 LAB — GLUCOSE, CAPILLARY
GLUCOSE-CAPILLARY: 62 mg/dL — AB (ref 65–99)
GLUCOSE-CAPILLARY: 90 mg/dL (ref 65–99)
Glucose-Capillary: 97 mg/dL (ref 65–99)
Glucose-Capillary: 185 mg/dL — ABNORMAL HIGH (ref 65–99)

## 2017-01-18 MED ORDER — GABAPENTIN 300 MG PO CAPS
300.0000 mg | ORAL_CAPSULE | Freq: Three times a day (TID) | ORAL | Status: DC
  Administered 2017-01-18 – 2017-01-20 (×6): 300 mg via ORAL
  Filled 2017-01-18 (×6): qty 1

## 2017-01-18 NOTE — Telephone Encounter (Signed)
Patient called regarding her husband. He is currently in [facility]. There are some issues and she wants him to have another Neuro Consult. Please Call Wife at [phone]. Thanks

## 2017-01-18 NOTE — Progress Notes (Addendum)
Physical Therapy Note  Patient Details  Name: Kenneth Pace MRN: [id number] Date of Birth: 01-13-1951 Today's Date: 01/18/2017    Time: 830-925 55 minutes  1:1 No c/o pain. Pt continues with mm spasms throughout treatment.  W/c mobility with bilat UEs 150' with supervision.  Sit to stand with mod A, gait with min A x 10' with RW, heavy reliance on UEs.  Supine <> sit on mat with mod/max A due to core and LE weakness.  Supine NMR with focus on proximal LE and core mm with pt requiring assist for activating hamstrings, glutes, quads, hip flexors, hip abd/add and hip IR/ER as well as abdominal and back mm.  Sitting balance without UE support with min/mod A, limited by spasms.  Pt left in room with needs at hand.   Time 2: 1300-1330 30 minutes  1:1 No c/o pain. Pt continues with mm spasms in back shooting down LEs.  kinetron 10 x 10 with AROM and AAROM when fatigued from LE strengthening and endurance.  W/c mobility with bilat UEs x 100' with supervision, increased time.  Pt requires frequent rest breaks but continues to be motivated to improve and participate as able.  Kenneth Pace 01/18/2017, 9:25 AM

## 2017-01-18 NOTE — Progress Notes (Signed)
Pt's insulin pump gave warning for low blood sugar Pt's sugar per our meter 62 pt gave himself 15 gm of carbohydrate. Pt does not want another finger stick done in 15 minutes. " I'm fine I run low in the mornings, You can recheck it in an hour". Risks reviewed pt would still like to wait.

## 2017-01-18 NOTE — Progress Notes (Signed)
Physical Therapy Note  Patient Details  Name: JETT FUKUDA MRN: [id number] Date of Birth: [birth year] Today's Date: 01/18/2017  1450-1525, 35 min individual tx Pain:pt had 15-20 episodes of extreme parasthesias in limbs and trunk, which he does not describe as painful, but pt grimacing and unable to continue until episode passes.  Pt exhausted and frustrated with lack of progress in last few days.  Squat pivot transfer w/c> bed to L with max assist to elevate hips and shift weight.  Pt able to scoot to [facility] using bed features and railing, supervision.  Neuromuscular re-education via multimodal cues and active assistance for bil bridging, lower trunk rotation, straight leg raises, all with active assistance PRN, cues to count aloud to prevent Valsalva maneuver.  PT cued pt for diaphragmatic breathing during frequent rest breaks.  Pt left resting in bed with alarm set and all needs within reach.  See function navigator for current status.  Montrail Mehrer 01/18/2017, 12:25 PM

## 2017-01-18 NOTE — Progress Notes (Signed)
Occupational Therapy Note  Patient Details  Name: Kenneth Pace MRN: [id number] Date of Birth: 1950-10-28   Pt's plan of care adjusted to 15/7 after speaking with care team and discussed with MD in team conference as pt currently unable to tolerate current therapy schedule with OT, and PT.    Lilo Wallington L 01/18/2017, 12:37 PM

## 2017-01-18 NOTE — Progress Notes (Signed)
[place] PHYSICAL MEDICINE & REHABILITATION     PROGRESS NOTE  Subjective/Complaints:  Pt seen sitting up in bed this AM.  He slept well overnight.  He requests increase in Neuropathic pain meds.   ROS: Denies nausea, vomiting, diarrhea, shortness of breath or chest pain    Objective: Vital Signs: Blood pressure 124/79, pulse 73, temperature 97.7 F (36.5 C), temperature source Oral, resp. rate 18, height 6' (1.829 m), weight 99.2 kg (218 lb 12.8 oz), SpO2 100 %. No results found. No results for input(s): WBC, HGB, HCT, PLT in the last 72 hours. No results for input(s): NA, K, CL, GLUCOSE, BUN, CREATININE, CALCIUM in the last 72 hours.  Invalid input(s): CO CBG (last 3)  Recent Labs    01/17/17 1157 01/17/17 2123 01/18/17 0632  GLUCAP 97 155* 62*    Wt Readings from Last 3 Encounters:  01/18/17 99.2 kg (218 lb 12.8 oz)  01/10/17 102.4 kg (225 lb 12 oz)  11/27/16 104.3 kg (230 lb)    Physical Exam:  BP 124/79 (BP Location: Left Arm)   Pulse 73   Temp 97.7 F (36.5 C) (Oral)   Resp 18   Ht 6' (1.829 m)   Wt 99.2 kg (218 lb 12.8 oz)   SpO2 100%   BMI 29.67 kg/m  Constitutional: He appears well-developed and well-nourished.  HENT: Normocephalic and atraumatic.  Eyes: EOM are normal. No discharge. Cardiovascular: RRR. No JVD  Respiratory: CTA Bilaterally. Normal effort    GI: Bowel sounds are normal. He exhibits no distension.   Musculoskeletal: He exhibits no edema or tenderness.  Neurological: He is alert and oriented.  Speech clear.  Cognition intact and able to follow commands without difficulty.  Motor: B/l UE: 5/5 proximal to distal B/l LE: HF 4+/5, ADF/PF 4+/5  Sensation diminished to light touch distal to knees and elbows Skin: Skin is warm and dry.  Psychiatric: He has a normal mood and affect. His behavior is normal. Judgment and thought content normal.   Assessment/Plan: 1. 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 Physiatrist is providing close team supervision and 24 hour management of active medical problems listed below. Physiatrist and rehab team continue to assess barriers to discharge/monitor patient progress toward functional and medical goals.  Function:  Bathing Bathing position   Position: Shower  Bathing parts Body parts bathed by patient: Right arm, Right upper leg, Left arm, Left upper leg, Abdomen, Right lower leg, Chest, Front perineal area, Left lower leg, Buttocks Body parts bathed by helper: Back  Bathing assist Assist Level: Touching or steadying assistance(Pt > 75%)      Upper Body Dressing/Undressing Upper body dressing   What is the patient wearing?: Pull over shirt/dress     Pull over shirt/dress - Perfomed by patient: Thread/unthread right sleeve, Thread/unthread left sleeve, Put head through opening, Pull shirt over trunk Pull over shirt/dress - Perfomed by helper: Pull shirt over trunk        Upper body assist Assist Level: Supervision or verbal cues      Lower Body Dressing/Undressing Lower body dressing   What is the patient wearing?: Underwear, Pants, Shoes, Socks Underwear - Performed by patient: Thread/unthread right underwear leg, Thread/unthread left underwear leg, Pull underwear up/down Underwear - Performed by helper: Thread/unthread right underwear leg, Thread/unthread left underwear leg, Pull underwear up/down Pants- Performed by patient: Thread/unthread right pants leg, Thread/unthread left pants leg, Pull pants  up/down Pants- Performed by helper: Thread/unthread right pants leg, Thread/unthread left pants leg, Pull pants up/down     Socks - Performed by patient: Don/doff right sock, Don/doff left sock(Sock aid) Socks - Performed by helper: Don/doff right sock, Don/doff left sock Shoes - Performed by patient: Don/doff right shoe, Don/doff left shoe Shoes  - Performed by helper: Don/doff right shoe, Don/doff left shoe          Lower body assist Assist for lower body dressing: Touching or steadying assistance (Pt > 75%)      Toileting Toileting   Toileting steps completed by patient: Adjust clothing prior to toileting, Performs perineal hygiene Toileting steps completed by helper: Adjust clothing after toileting Toileting Assistive Devices: Grab bar or rail  Toileting assist Assist level: More than reasonable time, Touching or steadying assistance (Pt.75%)   Transfers Chair/bed transfer   Chair/bed transfer method: Squat pivot Chair/bed transfer assist level: Moderate assist (Pt 50 - 74%/lift or lower) Chair/bed transfer assistive device: Armrests     Locomotion Ambulation     Max distance: 40 Assist level: Touching or steadying assistance (Pt > 75%)   Wheelchair   Type: Manual Max wheelchair distance: 50 Assist Level: Touching or steadying assistance (Pt > 75%)  Cognition Comprehension Comprehension assist level: Follows basic conversation/direction with extra time/assistive device  Expression Expression assist level: Expresses basic needs/ideas: With extra time/assistive device  Social Interaction Social Interaction assist level: Interacts appropriately with others with medication or extra time (anti-anxiety, antidepressant).  Problem Solving Problem solving assist level: Solves complex problems: With extra time  Memory Memory assist level: Recognizes or recalls 90% of the time/requires cueing < 10% of the time    Medical Problem List and Plan: 1.  Weakness, limitations with ADLs secondary to demyelinating myeloneuropathy, completed plasmapheresis on 11/24   Cont CIR 2.  DVT Prophylaxis/Anticoagulation: Pharmaceutical: Lovenox 3. Pain Management:    Flexeril PRN for muscle spasms with improvement   Gabapentin increased to 300 TID on 12/3 4. Mood: Remains optimistic. LCSW to follow for evaluation and support.  5.  Neuropsych: This patient is capable of making decisions on his own behalf. 6. Skin/Wound Care: Routine pressure relief measures. Maintain adequate nutritional and hydration status.  7. Fluids/Electrolytes/Nutrition: Monitor I/Os. Added nutritional supplement to help promote healing and for BS stabilization.    Labs pending 9.T2DM: Hgb A1c- 7.3 (managed by Dr. Loanne Drilling) Monitor BS ac/hs. Has insulin pump with monitor.    Improving overall on 12/3   Monitor with increased mobility 10. BPH/Neurogenic bladder: On finasteride in am.  11. HTN: Monitor BP bid. Continue lisinopril   Controlled on 12/3 12. Anxiety disorder: Due to work stress and has been managed with buspar.  13. Constipation: Resolved with bowel regimen of miralax bid.  LOS (Days) 7 A FACE TO FACE EVALUATION WAS PERFORMED  Merlean Pizzini Lorie Phenix 01/18/2017 9:31 AM

## 2017-01-18 NOTE — Telephone Encounter (Signed)
Called patient's wife back and she was on another line.  She will call me back when she gets done.

## 2017-01-18 NOTE — Progress Notes (Signed)
Pt wife is requesting a neurology consult, Wife is [name] cell number is [phone]. Has several questions and has concerns with progression. Advised would noted concern.  Feels that its important pt be followed after tx and pt has not been seen. She would like Neurology to see pt and contact her. Ok to leave msg and she will return the call as soon as possible.

## 2017-01-18 NOTE — Progress Notes (Signed)
Occupational Therapy Session Note  Patient Details  Name: Kenneth Pace MRN: [id number] Date of Birth: 1951/01/27  Today's Date: 01/18/2017 OT Individual Time: 1100-1200 OT Individual Time Calculation (min): 60 min    Short Term Goals: Week 1:  OT Short Term Goal 1 (Week 1): Pt will complete 2/3 toileting tasks with steadying assist OT Short Term Goal 2 (Week 1): Pt will don shirt with set-up OT Short Term Goal 3 (Week 1): Pt will don pants and socks using AE PRN with min A OT Short Term Goal 4 (Week 1): Pt will complete grooming task in standing with CGA for balance  Skilled Therapeutic Interventions/Progress Updates:    Pt seen for OT ADL bathing/dressing session. Pt sitting up in w/c upon arrival, proud of himself for completing grooming tasks mod I. He desired to shower. Pt with significantly increased weakness in B LEs. Required max A for stand pivot transfer to tub bench with use of grab bars. HE bathed from seated position, required assist for B LEs and buttock hygiene completed outside of shower for safety.  He dressed seated in w/c, using reacher to assist with threading. He stood with max A using RW and required assist to pull pants up as pt requires B UE support to stay upright when in RW. He donned socks with use of sock aid and min A.  He required min A for sitting balance in w/c throughout session due to severe spasms.  Pt left seated up in w/c at end of session, assist for set-up of meal tray and all needs in reach.   Therapy Documentation Precautions:  Precautions Precautions: Fall Restrictions Weight Bearing Restrictions: No Pain:   No/ denies pain despite spasms  See Function Navigator for Current Functional Status.   Therapy/Group: Individual Therapy  Shante Maysonet L 01/18/2017, 7:12 AM

## 2017-01-19 ENCOUNTER — Ambulatory Visit: Payer: BLUE CROSS/BLUE SHIELD | Admitting: Neurology

## 2017-01-19 ENCOUNTER — Inpatient Hospital Stay (HOSPITAL_COMMUNITY): Payer: BLUE CROSS/BLUE SHIELD | Admitting: Physical Therapy

## 2017-01-19 ENCOUNTER — Inpatient Hospital Stay (HOSPITAL_COMMUNITY): Payer: BLUE CROSS/BLUE SHIELD | Admitting: Occupational Therapy

## 2017-01-19 LAB — GLUCOSE, CAPILLARY
GLUCOSE-CAPILLARY: 208 mg/dL — AB (ref 65–99)
Glucose-Capillary: 303 mg/dL — ABNORMAL HIGH (ref 65–99)
Glucose-Capillary: 71 mg/dL (ref 65–99)
Glucose-Capillary: 86 mg/dL (ref 65–99)

## 2017-01-19 NOTE — Progress Notes (Signed)
Physical Therapy Note  Patient Details  Name: Kenneth Pace MRN: [id number] Date of Birth: May 25, 1950 Today's Date: 01/19/2017    Time: 1330-1435 65 minutes  1:1 No c/o pain at rest. Pt limited by mm spasms throughout treatment.  Supine to sit with max A, increased time.  Gait in room to bathroom with RW with min A, improved foot clearance after rest.  Toilet transfer and toileting with min A for standing balance, pt able to perform clothing negotiation without assistance.  Gait out of bathroom to w/c with min A, unable to clear feet due to fatigue.  Sit to stand training with focus on reducing reliance on UEs with pt able to stand x 8 initially with min A, progressing to max  A when fatigued.  Seated balance without UE support with ball toss and reaching tasks with pt able to correct LOB using UEs.  Pt tolerated sitting edge of mat x 35-40 minutes, then requires max  A to transfer to w/c due to fatigue.  Pt left in room with needs at hand   [facility] 01/19/2017, 2:57 PM

## 2017-01-19 NOTE — Progress Notes (Signed)
Physical Therapy Weekly Progress Note  Patient Details  Name: Kenneth Pace MRN: [id number] Date of Birth: 1950-06-10  Beginning of progress report period: January 12, 2017 End of progress report period: January 19, 2017  Patient has met 0 of 4 short term goals.  Pt with significant decline in function, limited by progressive LE and core weakness and increasing mm spasms.  Pt continues with great motivation but is requiring mod A with transfers and bed mobility, min A for sitting balance without UE support.  Patient continues to demonstrate the following deficits muscle weakness, unbalanced muscle activation and decreased sitting balance, decreased standing balance, decreased postural control and decreased balance strategies and therefore will continue to benefit from skilled PT intervention to increase functional independence with mobility.  Patient progressing toward long term goals.  Plan of care revisions: pt plan changed to 15/7 therapies.  PT Short Term Goals Week 1:  PT Short Term Goal 1 (Week 1): Pt will perform bed mobility with modI PT Short Term Goal 1 - Progress (Week 1): Not progressing PT Short Term Goal 2 (Week 1): Pt will perform stand pivot transfers with consistent S PT Short Term Goal 2 - Progress (Week 1): Not progressing PT Short Term Goal 3 (Week 1): Pt will ambulate 150' with RW and S PT Short Term Goal 3 - Progress (Week 1): Not progressing PT Short Term Goal 4 (Week 1): Pt will ascend/descend four 6" steps with minA PT Short Term Goal 4 - Progress (Week 1): Not progressing Week 2:  PT Short Term Goal 1 (Week 2): Pt will consistently perform functional transfers with min A PT Short Term Goal 2 (Week 2): pt will perform sitting balance without UE support with supervision for functional activities  Skilled Therapeutic Interventions/Progress Updates:  Ambulation/gait training;Discharge planning;Functional mobility training;Psychosocial support;Therapeutic  Activities;Wheelchair propulsion/positioning;Therapeutic Exercise;Skin care/wound management;Neuromuscular re-education;Disease management/prevention;Balance/vestibular training;DME/adaptive equipment instruction;UE/LE Strength taining/ROM;UE/LE Coordination activities;Stair training;Patient/family education;Functional electrical stimulation;Community reintegration;Pain management    See Function Navigator for Current Functional Status.   Kenneth Pace 01/19/2017, 8:11 AM

## 2017-01-19 NOTE — Progress Notes (Signed)
Patient has home CPAP and nasal pillows

## 2017-01-19 NOTE — Progress Notes (Signed)
Pt utilized own meter via insulin pump and reported that bs is 137, hospital machine not used. Will continue to monitor.

## 2017-01-19 NOTE — Progress Notes (Signed)
Occupational Therapy Weekly Progress Note  Patient Details  Name: Kenneth Pace MRN: [id number] Date of Birth: 1950-02-21  Beginning of progress report period: January 12, 2017 End of progress report period: January 19, 2017  Today's Date: 01/19/2017 OT Individual Time: 0900-1015 OT Individual Time Calculation (min): 75 min    Patient has met 2 of 4 short term goals.  Pt has made very limited progress this reporting period. He has been experiencing an increase in spasmsin limbs and trunk, which he does not describe as painful, but pt grimacing and unable to continue until episode passes. He is also limited by severe fatigue. He can require up to max A for squat pivot transfers when fatigued, which occurs after only very limited activity. Pt is very frustrated by lack of progress and family is requesting neuro consult. Therapists have been making PA and RN aware of pt's decline each day    Patient continues to demonstrate the following deficits:  ataxia, muscle weakness (generalized) and quadriparesis  and therefore will continue to benefit from skilled OT intervention to enhance overall performance with BADL and Reduce care partner burden.  Patient currently pt is not progressing towards OT goals due to severe spasms, poor activity tolerance, and quadraperesis that appears more severe compared to eval. If spasms and other neurological issues that have arisen since eval are managed, pt's goals remain appropriate. However, if prognosis is not favorable, will need to downgrade goals in the future. .  Continue plan of care.  OT Short Term Goals Week 1:  OT Short Term Goal 1 (Week 1): Pt will complete 2/3 toileting tasks with steadying assist OT Short Term Goal 1 - Progress (Week 1): Not met OT Short Term Goal 2 (Week 1): Pt will don shirt with set-up OT Short Term Goal 2 - Progress (Week 1): Met OT Short Term Goal 3 (Week 1): Pt will don pants and socks using AE PRN with min A OT Short  Term Goal 3 - Progress (Week 1): Met OT Short Term Goal 4 (Week 1): Pt will complete grooming task in standing with CGA for balance OT Short Term Goal 4 - Progress (Week 1): Not met Week 2:  OT Short Term Goal 1 (Week 2): Pt will consistently complete stand pivot transfers with min A OT Short Term Goal 2 (Week 2): Pt will manage pants during LB dressing with steayding assist OT Short Term Goal 3 (Week 2): Pt will stand to complete grooming task with steadying assist  Skilled Therapeutic Interventions/Progress Updates:    Pt seen for OT Session focusing on ADL re-training, functional standing balance/ endurance and neuro re-ed. Pt in supine upon arrival, agreeable to tx session. He transferred to EOB with supervision using hospital bed functions. Pt able to stand with RW and min-mod A and ambulated to sink. He completed grooming tasks from standing position, leaning into sink ledge for balance and energy conservation. Following seated rest break, he was able to ambulate into bathroom and completed 3/3 toileting tasks with steadying assist- a great improvement from yesterday's 11:00 session.  In therapy gym, pt completed standing peg board activity, utilziing B UEs to manipulate pegs mod I. He required increased time and concentration for in hand manipulation of pegs. Pt tolerated ~2-3 minutes in standing each trial before requiring seated rest break. On each subsequent sit >stand, pt required increased assist from initially min A to max A by last trial. Pt able to stand with CGA and using L UE on table  ledge to steady self. By last trial, pt unable to let go with UEs. Pt returned to room at end of session, requested return to supine. Max A stand pivot transfer to EOB with RW, manual facilitation to weightshift and advance L LE.  Pt left in supine with all needs in reach.   Therapy Documentation Precautions:  Precautions Precautions: Fall Restrictions Weight Bearing Restrictions: No Pain:   No/  denies pain  See Function Navigator for Current Functional Status.   Therapy/Group: Individual Therapy  [name] 01/19/2017, 7:19 AM

## 2017-01-19 NOTE — Progress Notes (Signed)
[facility] PHYSICAL MEDICINE & REHABILITATION     PROGRESS NOTE  Subjective/Complaints:  Pt states he slept well overnight. He notes improvement in spasms and neuropathic pain, but poor endurance.  He believes she should be improving.  He states his wife has set up an appointment to speak with Neurologist.   ROS: Denies nausea, vomiting, diarrhea, shortness of breath or chest pain    Objective: Vital Signs: Blood pressure 133/70, pulse 67, temperature 98.3 F (36.8 C), temperature source Oral, resp. rate 17, height 6' (1.829 m), weight 99 kg (218 lb 4.1 oz), SpO2 98 %. No results found. Recent Labs    01/18/17 1750  WBC 8.1  HGB 14.5  HCT 41.9  PLT 219   Recent Labs    01/18/17 1750  NA 135  K 3.7  CL 102  GLUCOSE 154*  BUN 20  CREATININE 0.82  CALCIUM 9.0   CBG (last 3)  Recent Labs    01/18/17 1652 01/18/17 2048 01/19/17 0650  GLUCAP 185* 90 303*    Wt Readings from Last 3 Encounters:  01/19/17 99 kg (218 lb 4.1 oz)  01/10/17 102.4 kg (225 lb 12 oz)  11/27/16 104.3 kg (230 lb)    Physical Exam:  BP 133/70 (BP Location: Left Arm)   Pulse 67   Temp 98.3 F (36.8 C) (Oral)   Resp 17   Ht 6' (1.829 m)   Wt 99 kg (218 lb 4.1 oz)   SpO2 98%   BMI 29.60 kg/m  Constitutional: He appears well-developed and well-nourished.  HENT: Normocephalic and atraumatic.  Eyes: EOM are normal. No discharge. Cardiovascular: RRR. No JVD  Respiratory: CTA Bilaterally. Normal effort    GI: Bowel sounds are normal. He exhibits no distension.   Musculoskeletal: He exhibits no edema or tenderness.  Neurological: He is alert and oriented.  Speech clear.  Cognition intact and able to follow commands without difficulty.  Motor: B/l UE: 5/5 proximal to distal B/l LE: HF 4+/5, ADF/PF 4+/5  Sensation diminished to light touch distal to knees and elbows Skin: Skin is warm and dry.  Psychiatric: He has a normal mood and affect. His behavior is normal. Judgment and thought  content normal.   Assessment/Plan: 1. 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 Physiatrist is providing close team supervision and 24 hour management of active medical problems listed below. Physiatrist and rehab team continue to assess barriers to discharge/monitor patient progress toward functional and medical goals.  Function:  Bathing Bathing position   Position: Shower  Bathing parts Body parts bathed by patient: Right arm, Right upper leg, Left arm, Left upper leg, Abdomen, Chest, Front perineal area Body parts bathed by helper: Buttocks, Right lower leg, Left lower leg, Back  Bathing assist Assist Level: Touching or steadying assistance(Pt > 75%)      Upper Body Dressing/Undressing Upper body dressing   What is the patient wearing?: Pull over shirt/dress     Pull over shirt/dress - Perfomed by patient: Thread/unthread right sleeve, Thread/unthread left sleeve, Put head through opening, Pull shirt over trunk Pull over shirt/dress - Perfomed by helper: Pull shirt over trunk        Upper body assist Assist Level: Supervision or verbal cues      Lower Body Dressing/Undressing Lower body dressing   What is the patient wearing?: Underwear, Pants, Socks, Non-skid slipper socks Underwear - Performed by patient: Thread/unthread right underwear leg,  Thread/unthread left underwear leg Underwear - Performed by helper: Pull underwear up/down Pants- Performed by patient: Thread/unthread right pants leg, Thread/unthread left pants leg Pants- Performed by helper: Pull pants up/down Non-skid slipper socks- Performed by patient: Don/doff right sock, Don/doff left sock(Sock aid)   Socks - Performed by patient: Don/doff right sock, Don/doff left sock(Sock aid) Socks - Performed by helper: Don/doff right sock, Don/doff left sock Shoes - Performed by patient:  Don/doff right shoe, Don/doff left shoe Shoes - Performed by helper: Don/doff right shoe, Don/doff left shoe          Lower body assist Assist for lower body dressing: Touching or steadying assistance (Pt > 75%)      Toileting Toileting   Toileting steps completed by patient: Adjust clothing prior to toileting, Performs perineal hygiene, Adjust clothing after toileting Toileting steps completed by helper: Adjust clothing after toileting Toileting Assistive Devices: Grab bar or rail  Toileting assist Assist level: More than reasonable time, Touching or steadying assistance (Pt.75%)   Transfers Chair/bed transfer   Chair/bed transfer method: Ambulatory Chair/bed transfer assist level: Touching or steadying assistance (Pt > 75%) Chair/bed transfer assistive device: Walker, Armrests, Bedrails     Locomotion Ambulation     Max distance: 40 Assist level: Touching or steadying assistance (Pt > 75%)   Wheelchair   Type: Manual Max wheelchair distance: 50 Assist Level: Touching or steadying assistance (Pt > 75%)  Cognition Comprehension Comprehension assist level: Follows complex conversation/direction with no assist  Expression Expression assist level: Expresses complex ideas: With no assist  Social Interaction Social Interaction assist level: Interacts appropriately with others - No medications needed.  Problem Solving Problem solving assist level: Solves complex problems: With extra time  Memory Memory assist level: Recognizes or recalls 90% of the time/requires cueing < 10% of the time    Medical Problem List and Plan: 1.  Weakness, limitations with ADLs secondary to demyelinating myeloneuropathy, completed plasmapheresis on 11/24   Cont CIR 2.  DVT Prophylaxis/Anticoagulation: Pharmaceutical: Lovenox 3. Pain Management:    Flexeril PRN for muscle spasms with improvement   Gabapentin increased to 300 TID on 12/3 4. Mood: Remains optimistic. LCSW to follow for evaluation and  support.  5. Neuropsych: This patient is capable of making decisions on his own behalf. 6. Skin/Wound Care: Routine pressure relief measures. Maintain adequate nutritional and hydration status.  7. Fluids/Electrolytes/Nutrition: Monitor I/Os. Added nutritional supplement to help promote healing and for BS stabilization.    BMP within acceptable range on 12/3 9.T2DM: Hgb A1c- 7.3 (managed by Dr. Loanne Drilling) Monitor BS ac/hs. Has insulin pump with monitor.    Remains labile, patient adjusting pump   Monitor with increased mobility 10. BPH/Neurogenic bladder: On finasteride in am.  11. HTN: Monitor BP bid. Continue lisinopril   Controlled on 12/4 12. Anxiety disorder: Due to work stress and has been managed with buspar.  13. Constipation: Resolved with bowel regimen of miralax bid.  LOS (Days) 8 A FACE TO FACE EVALUATION WAS PERFORMED  Kenneth Pace Phenix 01/19/2017 8:33 AM

## 2017-01-19 NOTE — Progress Notes (Signed)
Physical Therapy Session Note  Patient Details  Name: TISHAWN FRIEDHOFF MRN: [id number] Date of Birth: 1950-11-21  Today's Date: 01/19/2017 PT Individual Time: 1600-1630 PT Individual Time Calculation (min): 30 min   Short Term Goals: Week 2:  PT Short Term Goal 1 (Week 2): Pt will consistently perform functional transfers with min A PT Short Term Goal 2 (Week 2): pt will perform sitting balance without UE support with supervision for functional activities  Skilled Therapeutic Interventions/Progress Updates:   Pt in w/c upon arrival and agreeable to therapy, no c/o pain. Worked on endurance and LE strengthening this session. Pt self propelled w/c 75' using BUEs and performed kinetron in 60-90 sec bouts at lowest resistance setting. Moderate increase in work of breathing w/ all mobility and 3-4 whole body muscle/nerve spasms that last 5-10 seconds at a time. Returned to room in w/c, Total A for time management. Transferred to EOB via lateral scoot 2/2 LE fatigue w/ Min assist and assisted w/ repositioning in bed w/ Min assist. Ended session in supine, call bell within reach and all needs met.   Therapy Documentation Precautions:  Precautions Precautions: Fall Restrictions Weight Bearing Restrictions: No Vital Signs: Therapy Vitals Temp: 97.7 F (36.5 C) Temp Source: Oral Pulse Rate: 95 Resp: 17 BP: 96/62 Patient Position (if appropriate): Lying Oxygen Therapy SpO2: 95 % O2 Device: Not Delivered  See Function Navigator for Current Functional Status.   Therapy/Group: Individual Therapy  Taffy Delconte K Arnette 01/19/2017, 4:31 PM

## 2017-01-19 NOTE — Progress Notes (Signed)
Pt resting in bed quietly. Easily aroused. Denies pain at this time except for an occasional noted spasm which grips pt to the point of not being able to move until it passes. assisted to restroom with rolling walker and pt refuses to utilize the urinal neither wheelchair. Family noted at bedside and dynamics are noted. Freely discusses his hospitalization and hopes that neurology would be able to assist him with his disease process with answers to his noted questions. callbell within reach. Safety maintained. Will continue to monitor.

## 2017-01-19 NOTE — Telephone Encounter (Signed)
I spoke with patient's wife and she was very upset with the rehab doctors.  She kept telling me that her husband needs to see a neurologist at the hospital since he is still in pain.  She said that they had just increased his gabapentin so I told her that she needs to give that time to start working.  Instructed to to talk to rehab doctors about pain and neuro consult.

## 2017-01-20 ENCOUNTER — Inpatient Hospital Stay (HOSPITAL_COMMUNITY): Payer: BLUE CROSS/BLUE SHIELD | Admitting: Occupational Therapy

## 2017-01-20 ENCOUNTER — Inpatient Hospital Stay (HOSPITAL_COMMUNITY): Payer: BLUE CROSS/BLUE SHIELD | Admitting: Physical Therapy

## 2017-01-20 DIAGNOSIS — G6181 Chronic inflammatory demyelinating polyneuritis: Secondary | ICD-10-CM

## 2017-01-20 LAB — GLUCOSE, CAPILLARY
Glucose-Capillary: 285 mg/dL — ABNORMAL HIGH (ref 65–99)
Glucose-Capillary: 222 mg/dL — ABNORMAL HIGH (ref 65–99)
Glucose-Capillary: 109 mg/dL — ABNORMAL HIGH (ref 65–99)
Glucose-Capillary: 75 mg/dL (ref 65–99)
Glucose-Capillary: 174 mg/dL — ABNORMAL HIGH (ref 65–99)

## 2017-01-20 MED ORDER — GABAPENTIN 300 MG PO CAPS
600.0000 mg | ORAL_CAPSULE | Freq: Three times a day (TID) | ORAL | Status: DC
  Administered 2017-01-20 – 2017-01-31 (×35): 600 mg via ORAL
  Filled 2017-01-20 (×36): qty 2

## 2017-01-20 MED ORDER — IMMUNE GLOBULIN (HUMAN) 20 GM/200ML IV SOLN
400.0000 mg/kg | INTRAVENOUS | Status: AC
  Administered 2017-01-20 – 2017-01-22 (×3): 40 g via INTRAVENOUS
  Filled 2017-01-20 (×4): qty 400

## 2017-01-20 NOTE — Progress Notes (Signed)
1848 01/20/17 nursing IVIG completed patient tolerated reported to incoming RN for vital signs be taken 30 mins after completion.

## 2017-01-20 NOTE — Progress Notes (Addendum)
[facility] PHYSICAL MEDICINE & REHABILITATION     PROGRESS NOTE  Subjective/Complaints:  Patient seen sitting up in bed this morning. He asks me to speak to his wife. Patient states he slept well overnight. Patient's wife has several questions regarding prognosis, neuropathic pain, FMLA paperwork, medications. Patient notes that his pain has improved by 60%. Also educated wife on patient's functional improvement. Patient states he slept well overnight.  ROS: + Neuropathic pain. Denies nausea, vomiting, diarrhea, shortness of breath or chest pain    Objective: Vital Signs: Blood pressure 125/72, pulse 74, temperature (!) 97.5 F (36.4 C), temperature source Oral, resp. rate 18, height 6' (1.829 m), weight 99.2 kg (218 lb 11.1 oz), SpO2 100 %. No results found. Recent Labs    01/18/17 1750  WBC 8.1  HGB 14.5  HCT 41.9  PLT 219   Recent Labs    01/18/17 1750  NA 135  K 3.7  CL 102  GLUCOSE 154*  BUN 20  CREATININE 0.82  CALCIUM 9.0   CBG (last 3)  Recent Labs    01/19/17 1641 01/19/17 2040 01/20/17 0700  GLUCAP 71 86 285*    Wt Readings from Last 3 Encounters:  01/20/17 99.2 kg (218 lb 11.1 oz)  01/10/17 102.4 kg (225 lb 12 oz)  11/27/16 104.3 kg (230 lb)    Physical Exam:  BP 125/72 (BP Location: Left Arm)   Pulse 74   Temp (!) 97.5 F (36.4 C) (Oral)   Resp 18   Ht 6' (1.829 m)   Wt 99.2 kg (218 lb 11.1 oz)   SpO2 100%   BMI 29.66 kg/m  Constitutional: He appears well-developed and well-nourished.  HENT: Normocephalic and atraumatic.  Eyes: EOM are normal. No discharge. Cardiovascular: RRR. No JVD  Respiratory: CTA Bilaterally. Normal effort    GI: Bowel sounds are normal. He exhibits no distension.   Musculoskeletal: He exhibits no edema or tenderness.  Neurological: He is alert and oriented.  Speech clear.  Cognition intact and able to follow commands without difficulty.  Motor: B/l UE: 5/5 proximal to distal B/l LE: HF 4+/5, ADF/PF 4+/5  (stable) Skin: Skin is warm and dry.  Psychiatric: He has a normal mood and affect. His behavior is normal. Judgment and thought content normal.   Assessment/Plan: 1. 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 Physiatrist is providing close team supervision and 24 hour management of active medical problems listed below. Physiatrist and rehab team continue to assess barriers to discharge/monitor patient progress toward functional and medical goals.  Function:  Bathing Bathing position   Position: Shower  Bathing parts Body parts bathed by patient: Right arm, Right upper leg, Left arm, Left upper leg, Abdomen, Chest, Front perineal area, Right lower leg, Left lower leg Body parts bathed by helper: Back  Bathing assist Assist Level: Touching or steadying assistance(Pt > 75%)      Upper Body Dressing/Undressing Upper body dressing   What is the patient wearing?: Pull over shirt/dress     Pull over shirt/dress - Perfomed by patient: Thread/unthread right sleeve, Thread/unthread left sleeve, Put head through opening, Pull shirt over trunk Pull over shirt/dress - Perfomed by helper: Pull shirt over trunk        Upper body assist Assist Level: Set up   Set up : To obtain clothing/put away  Lower Body Dressing/Undressing Lower body dressing   What is the patient wearing?: Underwear,  Pants, Socks, Non-skid slipper socks Underwear - Performed by patient: Thread/unthread left underwear leg Underwear - Performed by helper: Pull underwear up/down Pants- Performed by patient: Thread/unthread right pants leg, Thread/unthread left pants leg Pants- Performed by helper: Pull pants up/down Non-skid slipper socks- Performed by patient: Don/doff right sock, Don/doff left sock(Sock aid)   Socks - Performed by patient: Don/doff right sock, Don/doff left sock(Sock aid) Socks -  Performed by helper: Don/doff right sock, Don/doff left sock Shoes - Performed by patient: Don/doff right shoe, Don/doff left shoe Shoes - Performed by helper: Don/doff right shoe, Don/doff left shoe          Lower body assist Assist for lower body dressing: Touching or steadying assistance (Pt > 75%)      Toileting Toileting   Toileting steps completed by patient: Adjust clothing prior to toileting, Performs perineal hygiene, Adjust clothing after toileting Toileting steps completed by helper: Adjust clothing after toileting Toileting Assistive Devices: Grab bar or rail  Toileting assist Assist level: Touching or steadying assistance (Pt.75%)   Transfers Chair/bed transfer   Chair/bed transfer method: Lateral scoot Chair/bed transfer assist level: Touching or steadying assistance (Pt > 75%) Chair/bed transfer assistive device: Armrests, Bedrails     Locomotion Ambulation     Max distance: 40 Assist level: Touching or steadying assistance (Pt > 75%)   Wheelchair   Type: Manual Max wheelchair distance: 5' Assist Level: Supervision or verbal cues  Cognition Comprehension Comprehension assist level: Follows basic conversation/direction with extra time/assistive device  Expression Expression assist level: Expresses basic needs/ideas: With extra time/assistive device  Social Interaction Social Interaction assist level: Interacts appropriately with others with medication or extra time (anti-anxiety, antidepressant).  Problem Solving Problem solving assist level: Solves complex problems: With extra time  Memory Memory assist level: Recognizes or recalls 90% of the time/requires cueing < 10% of the time    Medical Problem List and Plan: 1.  Weakness, limitations with ADLs secondary to demyelinating myeloneuropathy, completed plasmapheresis on 11/24   Cont CIR 2.  DVT Prophylaxis/Anticoagulation: Pharmaceutical: Lovenox 3. Pain Management:    Flexeril PRN for muscle spasms with  improvement   Gabapentin increased to 300 TID on 12/3, increased to 600 3 times a day on 12/5 4. Mood: Remains optimistic. LCSW to follow for evaluation and support.  5. Neuropsych: This patient is capable of making decisions on his own behalf. 6. Skin/Wound Care: Routine pressure relief measures. Maintain adequate nutritional and hydration status.  7. Fluids/Electrolytes/Nutrition: Monitor I/Os. Added nutritional supplement to help promote healing and for BS stabilization.    BMP within acceptable range on 12/3 9.T2DM: Hgb A1c- 7.3 (managed by Dr. Loanne Drilling) Monitor BS ac/hs. Has insulin pump with monitor.    Remains labile, patient adjusting pump   Monitor with increased mobility 10. BPH/Neurogenic bladder: On finasteride in am.  11. HTN: Monitor BP bid. Continue lisinopril   Controlled on 12/5 12. Anxiety disorder: Due to work stress and has been managed with buspar.  13. Constipation: Resolved with bowel regimen of miralax bid.  > 35 minutes spent with patient and wife. Greater than 30 minutes in counseling and education regarding prognosis, neuropathic pain, medications  LOS (Days) 9 A FACE TO FACE EVALUATION WAS PERFORMED  Kenneth Pace Lorie Phenix 01/20/2017 9:03 AM

## 2017-01-20 NOTE — Consult Note (Signed)
NEURO HOSPITALIST CONSULT NOTE   Requestig physician: Dr. Posey Pronto   Reason for Consult: Revisit for patient and answer questions for wife   History obtained from:  Patient    HPI:                                                                                                                                          Kenneth Pace is an 66 y.o. male who has been hospitalized for over a month at this point in time.  Patient was initially diagnosed with Ethelene Hal.  Patient initially received IVIG as an outpatient however did not see any significant improvement.  Patient was then sent to the hospital to receive PLX.  Patient had some improvement and went to inpatient rehab however has not had significant improvement to the point where the wife is very concerned and wanted neurology to reevaluate.  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  There is no cord signal.  Neurosurgery was called and due to no cord signal they had commented to wait and see if the PLX was showed any improvement before possible surgery.  Currently patient has improved somewhat however not significantly.  In addition today while talking to him I had mentioned the cervical stenosis and asked him to tilt his head back and he showed a definitive Lhermitte sign.  Past Medical History:  Diagnosis Date  . ADENOIDECTOMY, HX OF 02/16/2007  . Anxiety    pt. states he does not have anxiety.  Marland Kitchen BENIGN PROSTATIC HYPERTROPHY, WITH OBSTRUCTION 02/03/2010  . DIABETES MELLITUS, TYPE I, UNCONTROLLED 12/29/2006  . ERECTILE DYSFUNCTION 02/16/2007  . HYPERLIPIDEMIA 02/16/2007  . HYPERTENSION 02/16/2007  . TESTICULAR MASS, LEFT 02/16/2007    Past Surgical History:  Procedure Laterality Date  . KNEE SURGERY     2 Lknee arthroscopy, 3 R knee arthroscpoy  . KNEE SURGERY Right  11/12/2016  . TONSILLECTOMY      Family History  Problem Relation Age of Onset  . Dementia Mother   . Diabetes Mellitus I Mother   . Hypertension Father        82  . Healthy Sister   . Rheum arthritis Brother   . Cancer Neg Hx      Social History:  reports that  has never smoked. he has never used smokeless tobacco. He reports that he does not drink alcohol or use drugs.  No Known Allergies  MEDICATIONS:  Scheduled: . enoxaparin (LOVENOX) injection  40 mg Subcutaneous Q24H  . finasteride  5 mg Oral Daily  . gabapentin  600 mg Oral TID  . insulin pump   Subcutaneous TID AC, HS, 0200  . lisinopril  2.5 mg Oral Daily  . multivitamin with minerals  1 tablet Oral Daily  . polyethylene glycol  17 g Oral BID  . protein supplement shake  2 oz Oral QID  . vitamin B-12  1,000 mcg Oral Daily  . vitamin E  100 Units Oral Daily     ROS:                                                                                                                                       History obtained from the patient  General ROS: negative for - chills, fatigue, fever, night sweats, weight gain or weight loss Psychological ROS: negative for - behavioral disorder, hallucinations, memory difficulties, mood swings or suicidal ideation Ophthalmic ROS: negative for - blurry vision, double vision, eye pain or loss of vision ENT ROS: negative for - epistaxis, nasal discharge, oral lesions, sore throat, tinnitus or vertigo Allergy and Immunology ROS: negative for - hives or itchy/watery eyes Hematological and Lymphatic ROS: negative for - bleeding problems, bruising or swollen lymph nodes Endocrine ROS: negative for - galactorrhea, hair pattern changes, polydipsia/polyuria or temperature intolerance Respiratory ROS: negative for - cough, hemoptysis, shortness of breath or  wheezing Cardiovascular ROS: negative for - chest pain, dyspnea on exertion, edema or irregular heartbeat Gastrointestinal ROS: negative for - abdominal pain, diarrhea, hematemesis, nausea/vomiting or stool incontinence Genito-Urinary ROS: negative for - dysuria, hematuria, incontinence or urinary frequency/urgency Musculoskeletal ROS: Positive for -  muscular weakness Neurological ROS: as noted in HPI Dermatological ROS: negative for rash and skin lesion changes   Blood pressure 125/72, pulse 74, temperature (!) 97.5 F (36.4 C), temperature source Oral, resp. rate 18, height 6' (1.829 m), weight 99.2 kg (218 lb 11.1 oz), SpO2 100 %.   Neurologic Examination:                                                                                                      HEENT-  Normocephalic, no lesions, without obvious abnormality.  Normal external eye and conjunctiva.  Normal TM's bilaterally.  Normal auditory canals and external ears. Normal external nose, mucus membranes and septum.  Normal pharynx. Cardiovascular- S1, S2 normal, pulses palpable throughout   Lungs- chest clear, no wheezing, rales, normal symmetric air  entry Abdomen- normal findings: bowel sounds normal Extremities- no edema Lymph-no adenopathy palpable Musculoskeletal-no joint tenderness, deformity or swelling Skin-warm and dry, no hyperpigmentation, vitiligo, or suspicious lesions  Neurological Examination Mental Status: Alert, oriented, thought content appropriate.  Speech fluent without evidence of aphasia.  Able to follow 3 step commands without difficulty. Cranial Nerves: II: Discs flat bilaterally; Visual fields grossly normal,  III,IV, VI: ptosis not present, extra-ocular motions intact bilaterally pupils equal, round, reactive to light and accommodation V,VII: smile symmetric, facial light touch sensation normal bilaterally VIII: hearing normal bilaterally IX,X: uvula rises symmetrically XI: bilateral shoulder  shrug XII: midline tongue extension Motor: Bilateral weak grip 1/5, bilateral 3/5 flexion and extension of wrists, bilateral 5/5 of bicep flexion-tricep extension-shoulder AB duction.  2/5 strength with hip flexion, 4-/5 knee flexion extension bilaterally, 4/5 ankle flexion and extension.  Sensory: Pinprick and light touch intact throughout, bilaterally Deep Tendon Reflexes: Patient shows no bicep, tricep, brachioradialis reflexes.  He does also show brisk 3+ knee jerk and ankle jerk  plantars: Right: downgoing   Left: downgoing       Lab Results: Basic Metabolic Panel: Recent Labs  Lab 01/18/17 1750  NA 135  K 3.7  CL 102  CO2 26  GLUCOSE 154*  BUN 20  CREATININE 0.82  CALCIUM 9.0    Liver Function Tests: No results for input(s): AST, ALT, ALKPHOS, BILITOT, PROT, ALBUMIN in the last 168 hours. No results for input(s): LIPASE, AMYLASE in the last 168 hours. No results for input(s): AMMONIA in the last 168 hours.  CBC: Recent Labs  Lab 01/18/17 1750  WBC 8.1  HGB 14.5  HCT 41.9  MCV 88.0  PLT 219    Cardiac Enzymes: No results for input(s): CKTOTAL, CKMB, CKMBINDEX, TROPONINI in the last 168 hours.  Lipid Panel: No results for input(s): CHOL, TRIG, HDL, CHOLHDL, VLDL, LDLCALC in the last 168 hours.  CBG: Recent Labs  Lab 01/19/17 1200 01/19/17 1641 01/19/17 2040 01/20/17 0700 01/20/17 0854  GLUCAP 208* 71 57 285* 222*    Microbiology: Results for orders placed or performed during the hospital encounter of 12/29/16  MRSA PCR Screening     Status: None   Collection Time: 12/30/16  2:19 PM  Result Value Ref Range Status   MRSA by PCR NEGATIVE NEGATIVE Final    Comment:        The GeneXpert MRSA Assay (FDA approved for NASAL specimens only), is one component of a comprehensive MRSA colonization surveillance program. It is not intended to diagnose MRSA infection nor to guide or monitor treatment for MRSA infections.     Coagulation  Studies: No results for input(s): LABPROT, INR in the last 72 hours.  Imaging: No results found.     Assessment and plan per attending neurologist  Etta Quill PA-C [facility] [phone]  01/20/2017, 10:21 AM   Assessment/Plan: Again this is a 66 year old male with progressive weakness, numbness along with lower extremity hyperreflexia and upper extremity areflexia.  He has had a EMG that was done as an outpatient that did show evidence of demyelination.  Patient has received both IVIG and PLEX with some improvement .  In addition patient does show a Lhermitte sign when tilting his head back and forward along with hyperreflexia of his lower extremities.  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 Formulated plan as documented above. Recommendations as above. Will follow.  Karena Addison Aroor MD [facility] [phone]  If 7pm to 7am, please call on call as listed on AMION.

## 2017-01-20 NOTE — Progress Notes (Signed)
Occupational Therapy Session Note  Patient Details  Name: Kenneth Pace MRN: [id number] Date of Birth: 1950/06/22  Today's Date: 01/20/2017 OT Individual Time: 0730-0830 OT Individual Time Calculation (min): 60 min    Short Term Goals: Week 2:  OT Short Term Goal 1 (Week 2): Pt will consistently complete stand pivot transfers with min A OT Short Term Goal 2 (Week 2): Pt will manage pants during LB dressing with steayding assist OT Short Term Goal 3 (Week 2): Pt will stand to complete grooming task with steadying assist  Skilled Therapeutic Interventions/Progress Updates:    Pt seen for OT ADL bathing/dressing session. Pt in supine upon arrival, ready for tx session. He transferred to EOB with mod A using hospital bed functions. Throughout session, completed squat pivot transfers with min-mod A for energy conservation. He bathed seated on tub bench, instructed on lateral leans for buttock hygiene. He was able to reach when leaning to R and using L hand to wash, however, unable to coordinate with R hand. He returned to w/c to dress, using reacher to assist with threading LEs. He stood at sink with mod-max A to pull pants up, assist required to pull pants over R hip.  He used sock aid to assist with donning socks. Pt with decreased functional grasp and coordination in B hands this session compared to previous sessions.  Pt left seated in w/c at end of session, all needs in reach.  Discussed d/c planning and OT/PT goals. Pt's level of assist varies greatly throughout the day depending on fatigue and spasms. Discussed ADL performance at w/c vs sit<> stand level and POC. Will cont to discuss during team conference.   Therapy Documentation Precautions:  Precautions Precautions: Fall Restrictions Weight Bearing Restrictions: No Pain:   No/ denies pain  See Function Navigator for Current Functional Status.   Therapy/Group: Individual Therapy  Dartha Rozzell L 01/20/2017, 7:03 AM

## 2017-01-20 NOTE — Progress Notes (Signed)
Occupational Therapy Session Note  Patient Details  Name: Kenneth Pace MRN: [id number] Date of Birth: 01-09-1951  Today's Date: 01/20/2017 OT Individual Time: 1530-1558 OT Individual Time Calculation (min): 28 min    Short Term Goals: Week 2:  OT Short Term Goal 1 (Week 2): Pt will consistently complete stand pivot transfers with min A OT Short Term Goal 2 (Week 2): Pt will manage pants during LB dressing with steayding assist OT Short Term Goal 3 (Week 2): Pt will stand to complete grooming task with steadying assist  Skilled Therapeutic Interventions/Progress Updates:    Pt denies pain but does have spasms during session. Pt presented supine in bed agreeable to OT  tx session. Pt completes bed mobility with ModA, MaxA for squat pivot to w/c and increased time due to spasms.Propelled Pt to dayroom for time management where Pt engaged in seated table top activity focusing on [facility] using bil hand/coordination. Pt engaging in activity, however unable to fully complete pipe tree in full as RN arriving reporting IV team needing to see Pt soon. Pt returned to room in manner described above, completed stand pivot at [place] with MaxA for sit<>stand, MinA for pivot to EOB. Pt required MaxA to return to supine where Pt was left with needs within reach.   Therapy Documentation Precautions:  Precautions Precautions: Fall Restrictions Weight Bearing Restrictions: No    Vital Signs: Therapy Vitals Temp: 98.1 F (36.7 C) Temp Source: Oral Pulse Rate: 76 Resp: 16 BP: 121/61 Patient Position (if appropriate): Lying Oxygen Therapy SpO2: 99 % O2 Device: Not Delivered  See Function Navigator for Current Functional Status.   Therapy/Group: Individual Therapy  Raymondo Band 01/20/2017, 4:54 PM

## 2017-01-20 NOTE — Progress Notes (Signed)
Patient refused blood sugar this morning at 2am. Will take the blood sugar that is scheduled at 7am. Nothing else follows.

## 2017-01-20 NOTE — Progress Notes (Signed)
Physical Therapy Note  Patient Details  Name: Kenneth Pace MRN: [id number] Date of Birth: 05-30-1950 Today's Date: 01/20/2017    Time: 1300-1330 30 minutes  1:1 No c/o pain. Pt with 15-20 mm spasms during 30 minute treatment, more severe in nature.  Pt reports being up in w/c since 730am and is very fatigued. Pt requires max A for squat pivot transfer to bed. Max A for scooting at edge of bed, max A sit to supine.  Pt performed sitting balance without UE support 2 x 5 minutes with focus on improving core mm.  Pt left in room with needs at hand.   Sheryle Vice 01/20/2017, 1:42 PM

## 2017-01-21 ENCOUNTER — Inpatient Hospital Stay (HOSPITAL_COMMUNITY): Payer: BLUE CROSS/BLUE SHIELD | Admitting: Occupational Therapy

## 2017-01-21 ENCOUNTER — Inpatient Hospital Stay (HOSPITAL_COMMUNITY): Payer: BLUE CROSS/BLUE SHIELD

## 2017-01-21 LAB — GLUCOSE, CAPILLARY
GLUCOSE-CAPILLARY: 62 mg/dL — AB (ref 65–99)
GLUCOSE-CAPILLARY: 61 mg/dL — AB (ref 65–99)
Glucose-Capillary: 76 mg/dL (ref 65–99)
Glucose-Capillary: 76 mg/dL (ref 65–99)
Glucose-Capillary: 75 mg/dL (ref 65–99)
Glucose-Capillary: 118 mg/dL — ABNORMAL HIGH (ref 65–99)

## 2017-01-21 MED ORDER — PREMIER PROTEIN SHAKE
11.0000 [oz_av] | Freq: Three times a day (TID) | ORAL | Status: DC
  Administered 2017-01-21 – 2017-01-31 (×29): 11 [oz_av] via ORAL
  Filled 2017-01-21 (×46): qty 325.31

## 2017-01-21 NOTE — Progress Notes (Signed)
Occupational Therapy Session Note  Patient Details  Name: Kenneth Pace MRN: [id number] Date of Birth: [birth year]  Today's Date: 01/21/2017 OT Individual Time: 1400-1505 OT Individual Time Calculation (min): 65 min    Short Term Goals: Week 2:  OT Short Term Goal 1 (Week 2): Kenneth Pace will consistently complete stand pivot transfers with min A OT Short Term Goal 2 (Week 2): Kenneth Pace will manage pants during LB dressing with steayding assist OT Short Term Goal 3 (Week 2): Kenneth Pace will stand to complete grooming task with steadying assist  Skilled Therapeutic Interventions/Progress Updates:    Kenneth Pace seen for OT session focusing on functional transfers. Kenneth Pace in supine upon arrival, ready for tx session. Completed bed mobility on flat bed in simulation of home environment. Required mod A to roll to L and max A to come into sitting EOB.  Introduced Warehouse manager this session, extensive education and demonstration provided for transfer technique utilizing board. Kenneth Pace completed x5 throughout session, EOB> w/c, and w/c <> EOM. Completed with min A overall, VCs for head/hip relationship and for attention to management of LEs during transfer. Kenneth Pace very motivated to learn to place own board. Therefore, provided Kenneth Pace with short sliding board for easier self manage. Also introduced possibility of leg loops to assist with management of UEs, Kenneth Pace agreeable to try them.  Kenneth Pace desiring to complete hamstring stretch as he benefits from this in earlier Kenneth Pace session. Provided Kenneth Pace with leg lifter and demonstration provided for self stretching using leg lifter. Kenneth Pace loved this idea and demonstrated ability to self stretch with device. Kenneth Pace returned to room at end of session, Kenneth Pace desiring to sit up in w/c. Instructed Kenneth Pace to return to bed following dinner for energy conservation.   Therapy Documentation Precautions:  Precautions Precautions: Fall Restrictions Weight Bearing Restrictions: No Pain:   No/ denies pain   See Function Navigator for  Current Functional Status.   Therapy/Group: Individual Therapy  Lurine Imel L 01/21/2017, 7:03 AM

## 2017-01-21 NOTE — Progress Notes (Signed)
Physical Therapy Note  Patient Details  Name: Kenneth Pace MRN: [id number] Date of Birth: 14-Aug-1950 Today's Date: 01/21/2017  0800-0850, 50 min individual tx Pain:no pain per pt  Pt stated he had fewer paraesthesias since gabapentin increased.  Gently PROM bil hips in sitting before attempting to transfer.  Squat pivot bed> w/c to L with min assist. Gait with RW on level tile with ACE on L LL due to foot drop; pt unable to fully extend L knee in order to step safely with RLE.  Seated self ROM L hamstring x 30 seconds x 2. Sit> stand with max assist.  Pt unable to advance RLE, and sat back down.  In parallel bars, pre-gait training for improved R><L wt shifting with relaxation of contralateral knee.  Returned pt to bed, transferring to R.  Pt left resting in bed; Deidre Ala, RN giving meds.   See function navigator for current status.   Jerzy Crotteau 01/21/2017, 7:50 AM

## 2017-01-21 NOTE — Progress Notes (Addendum)
Note entered in error

## 2017-01-21 NOTE — Progress Notes (Addendum)
[facility] PHYSICAL MEDICINE & REHABILITATION     PROGRESS NOTE  Subjective/Complaints:  Pt seen sitting up in bed this AM.  He slept well overnight. He notes significant improvement in his pain/spasms and does not feel the need to make any medications changes.  He also notes improvement in sensation.    Neurology consulted yesterday - plan for IVIG.   ROS: Denies nausea, vomiting, diarrhea, shortness of breath or chest pain   Objective: Vital Signs: Blood pressure 135/70, pulse 66, temperature (!) 97.5 F (36.4 C), temperature source Oral, resp. rate 18, height 6' (1.829 m), weight 100.1 kg (220 lb 10.9 oz), SpO2 99 %. No results found. Recent Labs    01/18/17 1750  WBC 8.1  HGB 14.5  HCT 41.9  PLT 219   Recent Labs    01/18/17 1750  NA 135  K 3.7  CL 102  GLUCOSE 154*  BUN 20  CREATININE 0.82  CALCIUM 9.0   CBG (last 3)  Recent Labs    01/20/17 1627 01/20/17 2147 01/21/17 0629  GLUCAP 75 174* 76    Wt Readings from Last 3 Encounters:  01/21/17 100.1 kg (220 lb 10.9 oz)  01/10/17 102.4 kg (225 lb 12 oz)  11/27/16 104.3 kg (230 lb)    Physical Exam:  BP 135/70 (BP Location: Right Arm)   Pulse 66   Temp (!) 97.5 F (36.4 C) (Oral)   Resp 18   Ht 6' (1.829 m)   Wt 100.1 kg (220 lb 10.9 oz)   SpO2 99%   BMI 29.93 kg/m  Constitutional: He appears well-developed and well-nourished.  HENT: Normocephalic and atraumatic.  Eyes: EOM are normal. No discharge. Cardiovascular: RRR. No JVD  Respiratory: CTA Bilaterally. Normal effort    GI: Bowel sounds are normal. He exhibits no distension.   Musculoskeletal: He exhibits no edema or tenderness.  Neurological: He is alert and oriented.  Speech clear.  Cognition intact and able to follow commands without difficulty.  Motor: B/l UE: 5/5 proximal to distal B/l LE: HF 4+/5, ADF/PF 4+/5 (unchanged) Sensation improving to light touch distal LE and hands Skin: Skin is warm and dry.  Psychiatric: He has a normal  mood and affect. His behavior is normal. Judgment and thought content normal.   Assessment/Plan: 1. 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 Physiatrist is providing close team supervision and 24 hour management of active medical problems listed below. Physiatrist and rehab team continue to assess barriers to discharge/monitor patient progress toward functional and medical goals.  Function:  Bathing Bathing position   Position: Shower  Bathing parts Body parts bathed by patient: Right arm, Right upper leg, Left arm, Left upper leg, Abdomen, Chest, Front perineal area, Right lower leg, Left lower leg Body parts bathed by helper: Back  Bathing assist Assist Level: Touching or steadying assistance(Pt > 75%)      Upper Body Dressing/Undressing Upper body dressing   What is the patient wearing?: Pull over shirt/dress     Pull over shirt/dress - Perfomed by patient: Thread/unthread right sleeve, Thread/unthread left sleeve, Put head through opening, Pull shirt over trunk Pull over shirt/dress - Perfomed by helper: Pull shirt over trunk        Upper body assist Assist Level: Supervision or verbal cues   Set up : To obtain clothing/put away  Lower Body Dressing/Undressing Lower body dressing   What is the patient wearing?: Underwear,  Pants, Socks, Non-skid slipper socks Underwear - Performed by patient: Thread/unthread left underwear leg Underwear - Performed by helper: Pull underwear up/down Pants- Performed by patient: Thread/unthread right pants leg, Thread/unthread left pants leg Pants- Performed by helper: Pull pants up/down Non-skid slipper socks- Performed by patient: Don/doff right sock, Don/doff left sock(Sock aid)   Socks - Performed by patient: Don/doff right sock, Don/doff left sock(Sock aid) Socks - Performed by helper: Don/doff right sock,  Don/doff left sock Shoes - Performed by patient: Don/doff right shoe, Don/doff left shoe Shoes - Performed by helper: Don/doff right shoe, Don/doff left shoe          Lower body assist Assist for lower body dressing: Touching or steadying assistance (Pt > 75%)      Toileting Toileting   Toileting steps completed by patient: Adjust clothing prior to toileting, Performs perineal hygiene Toileting steps completed by helper: Adjust clothing after toileting Toileting Assistive Devices: Grab bar or rail  Toileting assist Assist level: Touching or steadying assistance (Pt.75%)   Transfers Chair/bed transfer   Chair/bed transfer method: Stand pivot Chair/bed transfer assist level: Touching or steadying assistance (Pt > 75%) Chair/bed transfer assistive device: Armrests, Medical sales representative     Max distance: 10 Assist level: Touching or steadying assistance (Pt > 75%)   Wheelchair   Type: Manual Max wheelchair distance: 20' Assist Level: Supervision or verbal cues  Cognition Comprehension Comprehension assist level: Follows basic conversation/direction with extra time/assistive device  Expression Expression assist level: Expresses basic needs/ideas: With extra time/assistive device  Social Interaction Social Interaction assist level: Interacts appropriately with others with medication or extra time (anti-anxiety, antidepressant).  Problem Solving Problem solving assist level: Solves complex problems: With extra time  Memory Memory assist level: Recognizes or recalls 90% of the time/requires cueing < 10% of the time    Medical Problem List and Plan: 1.  Weakness, limitations with ADLs secondary to demyelinating myeloneuropathy, completed plasmapheresis on 11/24   [place]   Neurology consulted, recommending trial of IVIG x3 and Neurosurgery consult. 2.  DVT Prophylaxis/Anticoagulation: Pharmaceutical: Lovenox 3. Pain Management:    Flexeril PRN for muscle spasms  with improvement   Gabapentin increased to 300 TID on 12/3, increased to 600 3 times a day on 12/5 - improved 4. Mood: Remains optimistic. LCSW to follow for evaluation and support.  5. Neuropsych: This patient is capable of making decisions on his own behalf. 6. Skin/Wound Care: Routine pressure relief measures. Maintain adequate nutritional and hydration status.  7. Fluids/Electrolytes/Nutrition: Monitor I/Os. Added nutritional supplement to help promote healing and for BS stabilization.    BMP within acceptable range on 12/3 9.T2DM: Hgb A1c- 7.3 (managed by Dr. Loanne Drilling) Monitor BS ac/hs. Has insulin pump with monitor.    Remain labile, but improving, , patient adjusting pump   Monitor with increased mobility 10. BPH/Neurogenic bladder: On finasteride in am.  11. HTN: Monitor BP bid. Continue lisinopril   Controlled on 12/6 12. Anxiety disorder: Due to work stress and has been managed with buspar.  13. Constipation: Resolved with bowel regimen of miralax bid.  LOS (Days) 10 A FACE TO FACE EVALUATION WAS PERFORMED  Kenneth Pace 01/21/2017 11:04 AM

## 2017-01-21 NOTE — Significant Event (Signed)
Hypoglycemic Event  CBG: 61  Treatment: 15 MG snack  Symptoms: None  Follow-up CBG: Time:1408 CBG Result:76  Possible Reasons for Event: Medication regimen: IVIG  Comments/MD notified: Patient ate additional snacks, juice, premier protein. Encourage patient to keep blood sugar up through evening Patient manages insulin pump    [name] H

## 2017-01-21 NOTE — Progress Notes (Signed)
Patient reports that he has had discussion with Dr.Nundkumar about follow up surgery.

## 2017-01-21 NOTE — Patient Care Conference (Addendum)
Inpatient RehabilitationTeam Conference and Plan of Care Update Date: 01/20/2017   Time: 2:30 PM    Patient Name: Kenneth Pace      Medical Record Number: [id number]  Date of Birth: 1950/10/20 Sex: Male         Room/Bed: 4W05C/4W05C-01 Payor Info: Payor: Ashley / Plan: BCBS OTHER / Product Type: *No Product type* /    Admitting Diagnosis: sob  Admit Date/Time:  01/11/2017  5:22 PM Admission Comments: No comment available   Primary Diagnosis:  <principal problem not specified> Principal Problem: <principal problem not specified>  Patient Active Problem List   Diagnosis Date Noted  . Neuropathic pain   . Benign essential HTN   . Labile blood glucose   . Muscle spasm   . Type 2 diabetes mellitus with peripheral neuropathy (HCC)   . Generalized anxiety disorder   . Acute inflammatory demyelinating polyneuropathy ([place]) 01/11/2017  . Anxiety state   . Neurogenic bladder   . Depression 12/30/2016  . Leg weakness, bilateral 12/30/2016  . 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 GBS (Guillain Barre syndrome) ([place])   . AIDP (acute inflammatory demyelinating polyneuropathy) ([place]) 11/27/2016  . Weakness 11/20/2016  . Weakness of both arms 10/30/2016  . Numbness 03/18/2016  . Diabetes mellitus without complication ([place]) 12/10/8525  . Eustachian tube dysfunction 02/12/2015  . Acute maxillary sinusitis 02/12/2015  . Wellness examination 08/22/2014  . Alkaline phosphatase elevation 08/22/2014  . Screening for prostate cancer 04/24/2013  . Routine general medical examination at a health care facility 04/10/2012  . BPH (benign prostatic hyperplasia) 02/03/2010  . HEMOCCULT POSITIVE STOOL 05/25/2008  . ELBOW PAIN, RIGHT 07/04/2007  . Dyslipidemia 02/16/2007  . ANXIETY 02/16/2007  . ERECTILE DYSFUNCTION 02/16/2007  . Essential hypertension 02/16/2007  . TESTICULAR MASS, LEFT 02/16/2007  . KNEE PAIN, CHRONIC 02/16/2007  .  ADENOIDECTOMY, HX OF 02/16/2007    Expected Discharge Date: Expected Discharge Date: 02/04/17  Team Members Present: Physician leading conference: Dr. Delice Lesch Social Worker Present: Lennart Pall, LCSW Nurse Present: Junius Creamer, RN PT Present: Roderic Ovens, PT OT Present: Napoleon Form, OT SLP Present: Windell Moulding, SLP PPS Coordinator present : Daiva Nakayama, RN, CRRN     Current Status/Progress Goal Weekly Team Focus  Medical   Weakness, limitations with ADLs secondary to demyelinating myeloneuropathy, completed plasmapheresis on 11/24  Improve mobility, neuropathic pain, DM  See above   Bowel/Bladder   Continent of of bowel and bladder. LBM 01/18/2017  Maintain continence of B&B  Assess B&B q shift and PRN   Swallow/Nutrition/ Hydration   carb modified / cardiac diet  maintain weight without loss      ADL's   Depending on fatigue, can be min A with functional ambulation and ADLs, however, when fatigued can require max A for squat pivot transfers  Set at mod I level, may need to downgrade if prognosis is not favorable  ADL re-training, functional transfers, activity tolerance, neuro re-ed   Mobility   fluctuating level of assist depending on fatigue min - max  supervision/mod I  NMR, strengthening   Communication   clear verbal communciation  maintain communication      Safety/Cognition/ Behavioral Observations  Free from injury  Patient to remain free from injury  Make sure room free from clutter with proper lighting   Pain   Spasmodic back pain  Maintain pain and discomfort  Monitor pain q shift and prn   Skin   Insulin pump noted to LUQ  Maintain skin free from breakdown  Monitor skin q shift and prn    Rehab Goals Patient on target to meet rehab goals: No Rehab Goals Revised: some functional decline this week - awaiting neurology input *See Care Plan and progress notes for long and short-term goals.     Barriers to Discharge  Current Status/Progress Possible  Resolutions Date Resolved   Physician    Medical stability     See above  Therapies, optimize neuropathic pain meds, insulin pump being managed by PCP      Nursing                  PT                    OT                  SLP                SW                Discharge Planning/Teaching Needs:  Pt will d/c home with wife who can provide supervision as she does work from home.  Teaching needs TBD   Team Discussion:  MD feels neuropathic pain improving.  Therapists report loss of function this week overall and uncertain how to set goals at this point.  Initial goals had been set for supervision.  Awaiting neurology input.  Function definitely worsens as day progresses.  Arms with better strength than hands. Recommend change of d/c date - lengthen LOS due to decline.  Revisions to Treatment Plan:  Extending ELOS due to decline and await neurology.    Continued Need for Acute Rehabilitation Level of Care: The patient requires daily medical management by a physician with specialized training in physical medicine and rehabilitation for the following conditions: Daily direction of a multidisciplinary physical rehabilitation program to ensure safe treatment while eliciting the highest outcome that is of practical value to the patient.: Yes Daily medical management of patient stability for increased activity during participation in an intensive rehabilitation regime.: Yes Daily analysis of laboratory values and/or radiology reports with any subsequent need for medication adjustment of medical intervention for : Neurological problems;Diabetes problems;Blood pressure problems;Other  Sarabi Sockwell 01/21/2017, 10:52 AM

## 2017-01-21 NOTE — Significant Event (Signed)
Hypoglycemic Event  CBG: 62  Treatment: 15 GM carbohydrate snack  Symptoms: None  Follow-up CBG: Time:1243 CBG Result:61  Possible Reasons for Event: Medication regimen: ivig  Comments/MD notified:Pamela Love, PA    Aleya Durnell H

## 2017-01-21 NOTE — Progress Notes (Signed)
Patient refused BS this morning at 0200

## 2017-01-21 NOTE — Progress Notes (Signed)
Occupational Therapy Session Note  Patient Details  Name: Kenneth Pace MRN: [id number] Date of Birth: February 21, 1950  Today's Date: 01/21/2017 OT Individual Time: 1000-1100 OT Individual Time Calculation (min): 60 min    Short Term Goals: Week 2:  OT Short Term Goal 1 (Week 2): Pt will consistently complete stand pivot transfers with min A OT Short Term Goal 2 (Week 2): Pt will manage pants during LB dressing with steayding assist OT Short Term Goal 3 (Week 2): Pt will stand to complete grooming task with steadying assist  Skilled Therapeutic Interventions/Progress Updates:    Pt presented supine in bed agreeable to OT treatment session. Pt completes bed mobility with MinA for bringing trunk upright. Pt declines bathing/LB dressing this session, doffs/dons overhead shirt with close supervision for sitting balance EOB. Pt completes stand pivot to w/c using RW with MinA. Pt completes grooming ADLs seated in w/c for energy conservation, increased time and effort for opening containers/obtaining items. Pt requests need to toilet, completes transfer w/c<>toilet using grab bars with [place], completes clothing management prior to toileting with Mod steadying assist, requires MaxA after toileting. Transported Pt to dayroom via w/c for energy conservation where Pt engaged in seated FM activities incorporating bil UE tasks, translation of one to two items in hand and digit AROM. Pt returned to room in manner described above, requires [place] for sit<>stand at [place] (x2 attempts), ModA for stand pivot to bed including manual facilitation of LLE during transfer. Pt left supine in bed, call bell and needs within reach.   Therapy Documentation Precautions:  Precautions Precautions: Fall Restrictions Weight Bearing Restrictions: No       See Function Navigator for Current Functional Status.   Therapy/Group: Individual Therapy  Raymondo Band 01/21/2017, 4:36 PM

## 2017-01-22 ENCOUNTER — Inpatient Hospital Stay (HOSPITAL_COMMUNITY): Payer: BLUE CROSS/BLUE SHIELD | Admitting: Occupational Therapy

## 2017-01-22 ENCOUNTER — Inpatient Hospital Stay (HOSPITAL_COMMUNITY): Payer: BLUE CROSS/BLUE SHIELD | Admitting: Physical Therapy

## 2017-01-22 DIAGNOSIS — M4712 Other spondylosis with myelopathy, cervical region: Secondary | ICD-10-CM

## 2017-01-22 LAB — GLUCOSE, CAPILLARY
GLUCOSE-CAPILLARY: 104 mg/dL — AB (ref 65–99)
GLUCOSE-CAPILLARY: 118 mg/dL — AB (ref 65–99)
GLUCOSE-CAPILLARY: 55 mg/dL — AB (ref 65–99)
Glucose-Capillary: 110 mg/dL — ABNORMAL HIGH (ref 65–99)
Glucose-Capillary: 71 mg/dL (ref 65–99)

## 2017-01-22 LAB — CBC
HCT: 41.3 % (ref 39.0–52.0)
Hemoglobin: 14.3 g/dL (ref 13.0–17.0)
MCH: 30.4 pg (ref 26.0–34.0)
MCHC: 34.6 g/dL (ref 30.0–36.0)
MCV: 87.9 fL (ref 78.0–100.0)
PLATELETS: 178 10*3/uL (ref 150–400)
RBC: 4.7 MIL/uL (ref 4.22–5.81)
RDW: 12.9 % (ref 11.5–15.5)
WBC: 4.3 10*3/uL (ref 4.0–10.5)

## 2017-01-22 LAB — BASIC METABOLIC PANEL
ANION GAP: 5 (ref 5–15)
BUN: 17 mg/dL (ref 6–20)
CALCIUM: 9 mg/dL (ref 8.9–10.3)
CO2: 28 mmol/L (ref 22–32)
CREATININE: 0.85 mg/dL (ref 0.61–1.24)
Chloride: 103 mmol/L (ref 101–111)
Glucose, Bld: 140 mg/dL — ABNORMAL HIGH (ref 65–99)
Potassium: 4 mmol/L (ref 3.5–5.1)
Sodium: 136 mmol/L (ref 135–145)

## 2017-01-22 NOTE — Progress Notes (Addendum)
[facility] PHYSICAL MEDICINE & REHABILITATION     PROGRESS NOTE  Subjective/Complaints:  Patient seen sitting up in his bed this morning. He states he slept fairly overnight. He states he feels better when he pulls himself up in bed.  ROS: Denies nausea, vomiting, diarrhea, shortness of breath or chest pain   Objective: Vital Signs: Blood pressure 134/82, pulse 68, temperature 97.7 F (36.5 C), temperature source Oral, resp. rate 10, height 6' (1.829 m), weight 100 kg (220 lb 7.4 oz), SpO2 97 %. No results found. Recent Labs    01/22/17 0435  WBC 4.3  HGB 14.3  HCT 41.3  PLT 178   Recent Labs    01/22/17 0435  NA 136  K 4.0  CL 103  GLUCOSE 140*  BUN 17  CREATININE 0.85  CALCIUM 9.0   CBG (last 3)  Recent Labs    01/21/17 1634 01/21/17 2124 01/22/17 0632  GLUCAP 75 118* 104*    Wt Readings from Last 3 Encounters:  01/22/17 100 kg (220 lb 7.4 oz)  01/10/17 102.4 kg (225 lb 12 oz)  11/27/16 104.3 kg (230 lb)    Physical Exam:  BP 134/82 (BP Location: Right Arm)   Pulse 68   Temp 97.7 F (36.5 C) (Oral)   Resp 10   Ht 6' (1.829 m)   Wt 100 kg (220 lb 7.4 oz)   SpO2 97%   BMI 29.90 kg/m  Constitutional: He appears well-developed and well-nourished.  HENT: Normocephalic and atraumatic.  Eyes: EOM are normal. No discharge. Cardiovascular: RRR. No JVD  Respiratory: CTA Bilaterally. Normal effort    GI: Bowel sounds are normal. He exhibits no distension.   Musculoskeletal: He exhibits no edema or tenderness.  Neurological: He is alert and oriented.  Speech clear.  Cognition intact and able to follow commands without difficulty.  Motor: B/l UE: 5/5 proximal to distal B/l LE: HF 4+/5, ADF/PF 4+/5 (stable) Sensation improving to light touch distal LE and hands Skin: Skin is warm and dry.  Psychiatric: He has a normal mood and affect. His behavior is normal. Judgment and thought content normal.   Assessment/Plan: 1. Functional deficits secondary to  demyelinating myeloneuropathy which require 3+ hours per day of interdisciplinary therapy in a comprehensive inpatient rehab setting. Physiatrist is providing close team supervision and 24 hour management of active medical problems listed below. Physiatrist and rehab team continue to assess barriers to discharge/monitor patient progress toward functional and medical goals.  Function:  Bathing Bathing position   Position: Shower  Bathing parts Body parts bathed by patient: Right arm, Right upper leg, Left arm, Left upper leg, Abdomen, Chest, Front perineal area, Right lower leg, Left lower leg, Buttocks Body parts bathed by helper: Back  Bathing assist Assist Level: Supervision or verbal cues      Upper Body Dressing/Undressing Upper body dressing   What is the patient wearing?: Pull over shirt/dress     Pull over shirt/dress - Perfomed by patient: Thread/unthread right sleeve, Thread/unthread left sleeve, Put head through opening, Pull shirt over trunk Pull over shirt/dress - Perfomed by helper: Pull shirt over trunk        Upper body assist Assist Level: More than reasonable time   Set up : To obtain clothing/put away  Lower Body Dressing/Undressing Lower body dressing   What is the patient wearing?: Underwear, Pants, Socks, Non-skid slipper socks Underwear - Performed by patient: Thread/unthread right underwear leg, Thread/unthread left underwear leg Underwear - Performed by helper: Pull underwear  up/down Pants- Performed by patient: Thread/unthread right pants leg, Thread/unthread left pants leg Pants- Performed by helper: Pull pants up/down Non-skid slipper socks- Performed by patient: Don/doff right sock, Don/doff left sock(Sock aid)   Socks - Performed by patient: Don/doff right sock, Don/doff left sock(Sock aid) Socks - Performed by helper: Don/doff right sock, Don/doff left sock Shoes - Performed by patient: Don/doff right shoe, Don/doff left shoe Shoes - Performed by  helper: Don/doff right shoe, Don/doff left shoe          Lower body assist Assist for lower body dressing: Touching or steadying assistance (Pt > 75%)      Toileting Toileting   Toileting steps completed by patient: Adjust clothing prior to toileting, Performs perineal hygiene Toileting steps completed by helper: Adjust clothing after toileting Toileting Assistive Devices: Grab bar or rail  Toileting assist Assist level: Two helpers(fatigue)   Transfers Chair/bed transfer   Chair/bed transfer method: Lateral scoot Chair/bed transfer assist level: Touching or steadying assistance (Pt > 75%) Chair/bed transfer assistive device: Sliding board     Locomotion Ambulation     Max distance: 10 Assist level: Touching or steadying assistance (Pt > 75%)   Wheelchair   Type: Manual Max wheelchair distance: 80' Assist Level: Supervision or verbal cues  Cognition Comprehension Comprehension assist level: Follows basic conversation/direction with extra time/assistive device  Expression Expression assist level: Expresses complex ideas: With extra time/assistive device  Social Interaction Social Interaction assist level: Interacts appropriately with others - No medications needed.  Problem Solving Problem solving assist level: Solves complex problems: With extra time  Memory Memory assist level: Recognizes or recalls 90% of the time/requires cueing < 10% of the time    Medical Problem List and Plan: 1.  Weakness, limitations with ADLs secondary to demyelinating myeloneuropathy, completed plasmapheresis on 11/24   Cont CIR   Neurology consulted, recommending trial of IVIG x3, day 3/3 today.   Also recommended Neurosurgery consult.  Patient states he spoke to neurosurgery and they are following along. Appreciate assistance   Discussed trial of traction with therapies. 2.  DVT Prophylaxis/Anticoagulation: Pharmaceutical: Lovenox 3. Pain Management:    Flexeril PRN for muscle spasms with  improvement   Gabapentin increased to 300 TID on 12/3, increased to 600 3 times a day on 12/5 - improved 4. Mood: Remains optimistic. LCSW to follow for evaluation and support.  5. Neuropsych: This patient is capable of making decisions on his own behalf. 6. Skin/Wound Care: Routine pressure relief measures. Maintain adequate nutritional and hydration status.  7. Fluids/Electrolytes/Nutrition: Monitor I/Os. Added nutritional supplement to help promote healing and for BS stabilization.    BMP within acceptable range on 12/7 9.T2DM: Hgb A1c- 7.3 (managed by Dr. Loanne Drilling) Monitor BS ac/hs. Has insulin pump with monitor.    Stabilizing   Monitor with increased mobility 10. BPH/Neurogenic bladder: On finasteride in am.  11. HTN: Monitor BP bid. Continue lisinopril   Controlled on 12/7 12. Anxiety disorder: Due to work stress and has been managed with buspar.  13. Constipation: Resolved with bowel regimen of miralax bid.  LOS (Days) 11 A FACE TO FACE EVALUATION WAS PERFORMED  Enes Rokosz Lorie Phenix 01/22/2017 9:09 AM

## 2017-01-22 NOTE — Progress Notes (Signed)
Social Work Patient ID: Kenneth Pace, male   DOB: 04-24-1950, 66 y.o.   MRN: [id number]   Have discussed team conference this week with pt and wife.  Both hopeful that re-start of IVIG will lead to functional improvement, however, wife realistic that his care needs may be much greater than originally anticipated.  They are aware that original target d/c date was changed to 12/20 but this is not certain.  Will follow up with both after next week's conference and after completion of IVIG.  Pt's spirits remain positive and wife describes him as "ever the optimist."    Kanesha Cadle, LCSW

## 2017-01-22 NOTE — Progress Notes (Signed)
Occupational Therapy Session Note  Patient Details  Name: Kenneth Pace MRN: [id number] Date of Birth: 11-05-1950  Today's Date: 01/22/2017 OT Individual Time: 0730-0830 OT Individual Time Calculation (min): 60 min    Short Term Goals: Week 2:  OT Short Term Goal 1 (Week 2): Pt will consistently complete stand pivot transfers with min A OT Short Term Goal 2 (Week 2): Pt will manage pants during LB dressing with steayding assist OT Short Term Goal 3 (Week 2): Pt will stand to complete grooming task with steadying assist  Skilled Therapeutic Interventions/Progress Updates:    Pt seen for OT ADL bathing/dressing session. Pt awake in bed upon arrival, agreeable to tx session. He transferred to EOB with supervision using hospital bed functions. Pt able to place own sliding board and completed sliding board transfer to w/c with guarding assist.  He completed squat pivot transfer into shower with use of grab bar with min A. He doffed clothing via lateral leans and bathed seated on tub bench. He requried increased assist for exiting shower due to losing grip on grab bar.  He returned to w/c to dress, using reacher to assist with threading LEs into pants with significantly increased time. HE stood with mod A to RE, attempted to let go with B UEs in order to to pull pants up, however, pt unable to maintain upright standing position without UE support and assist required to advance pants over hips.  He used sock aid to don R sock, assist provided for L for time and energy management. He completed min A sliding board transfer back to EOB with VCs for head/hip relationship, pt able to manage LEs independently during transfer. Mod A to return to supine. Pt left in supine with all needs in reach and bed alarm on.   Therapy Documentation Precautions:  Precautions Precautions: Fall Restrictions Weight Bearing Restrictions: No Pain:   No/ denies pain  See Function Navigator for Current Functional  Status.   Therapy/Group: Individual Therapy  Ariyana Faw L 01/22/2017, 7:03 AM

## 2017-01-22 NOTE — Progress Notes (Signed)
Subjective: Neurontin is helping with neuropathic pain. Feels a little stronger with his grip and hips but admits after therapy he becomes very weak. Good spirits  Exam: Vitals:   01/21/17 2022 01/22/17 0535  BP: 136/90 134/82  Pulse: 83 68  Resp: 18 10  Temp: 98 F (36.7 C) 97.7 F (36.5 C)  SpO2: 97% 97%    HEENT-  Normocephalic, no lesions, without obvious abnormality.  Normal external eye and conjunctiva.  Normal TM's bilaterally.  Normal auditory canals and external ears. Normal external nose, mucus membranes and septum.  Normal pharynx. Cardiovascular- S1, S2 normal, pulses palpable throughout   Lungs- chest clear, no wheezing, rales, normal symmetric air entry, Heart exam - S1, S2 normal, no murmur, no gallop, rate regular Abdomen- normal findings: bowel sounds normal Extremities- no edema    Neuro:  Mental Status: Alert, oriented, thought content appropriate.  Speech fluent without evidence of aphasia.  Able to follow 3 step commands without difficulty. Cranial Nerves: II: Discs flat bilaterally; Visual fields grossly normal,  III,IV, VI: ptosis not present, extra-ocular motions intact bilaterally pupils equal, round, reactive to light and accommodation V,VII: smile symmetric, facial light touch sensation normal bilaterally VIII: hearing normal bilaterally IX,X: uvula rises symmetrically XI: bilateral shoulder shrug XII: midline tongue extension Motor: Bilateral grip3+/5, bilateral 3/5 flexion and extension of wrists, bilateral 5/5 of bicep flexion-tricep extension-shoulder AB duction. 3/5 strength with hip flexion,4-/5 knee flexion extension bilaterally, 4/5 ankle flexion and extension.  Sensory: Pinprick and light touch intact throughout, bilaterally Deep Tendon Reflexes: Patient shows no bicep, tricep, brachioradialis reflexes.  He does also show brisk 3+ knee jerk and ankle jerk  plantars: Right: downgoing                                Left:  downgoing    Medications:  Scheduled: . enoxaparin (LOVENOX) injection  40 mg Subcutaneous Q24H  . finasteride  5 mg Oral Daily  . gabapentin  600 mg Oral TID  . insulin pump   Subcutaneous TID AC, HS, 0200  . lisinopril  2.5 mg Oral Daily  . multivitamin with minerals  1 tablet Oral Daily  . polyethylene glycol  17 g Oral BID  . protein supplement shake  11 oz Oral TID BM  . vitamin B-12  1,000 mcg Oral Daily  . vitamin E  100 Units Oral Daily   Continuous: . Immune Globulin 10% 40 g (01/21/17 1745)    Pertinent Labs/Diagnostics: none  No results found.   Etta Quill PA-C [facility] [phone]  Impression: 66 year old male with progressive weakness, numbness along with lower extremity hyperreflexia and upper extremity areflexia.  He has had a EMG that was done as an outpatient that did show evidence of demyelination.  Patient has received both IVIG and PLEX with some improvement .  In addition patient does show a Lhermitte sign when tilting his head back and forward along with hyperreflexia of his lower extremities.  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  Weakness due to Chronic Inflammatory Demyelinating Polyneuropathy vs Cervical Myelopathy  Currently has had additional 2 IVIG with third scheduled for today  Neurosurgery has seen patient and would like to see how he does after IVIG   Recommendations: 1) Continue IVIG 2) Continue therapy      01/22/2017, 12:56 PM

## 2017-01-22 NOTE — Progress Notes (Signed)
Occupational Therapy Session Note  Patient Details  Name: Kenneth Pace MRN: [id number] Date of Birth: 06/28/1950  Today's Date: 01/22/2017 OT Individual Time: 1135-1210 OT Individual Time Calculation (min): 35 min    Short Term Goals: Week 1:  OT Short Term Goal 1 (Week 1): Pt will complete 2/3 toileting tasks with steadying assist OT Short Term Goal 1 - Progress (Week 1): Not met OT Short Term Goal 2 (Week 1): Pt will don shirt with set-up OT Short Term Goal 2 - Progress (Week 1): Met OT Short Term Goal 3 (Week 1): Pt will don pants and socks using AE PRN with min A OT Short Term Goal 3 - Progress (Week 1): Met OT Short Term Goal 4 (Week 1): Pt will complete grooming task in standing with CGA for balance OT Short Term Goal 4 - Progress (Week 1): Not met Week 2:  OT Short Term Goal 1 (Week 2): Pt will consistently complete stand pivot transfers with min A OT Short Term Goal 2 (Week 2): Pt will manage pants during LB dressing with steayding assist OT Short Term Goal 3 (Week 2): Pt will stand to complete grooming task with steadying assist    Skilled Therapeutic Interventions/Progress Updates:    Pt seen this session to focus on trunk control with ROM. Pt received in bed and sat to EOB with min/mod A.  Initially pt had a posterior LOB with mod A to recover. Worked on slow controlled movements of trunk ant/posterior and in rotation patterns adding in dynamic reaching.  Static sit with alternate arm reach.  Pt worked on sit to partial squat to work on scooting down length of bed towards pillow with min A.  Pt had a controlled forward lean and was able to push through his legs. Pt was relieved he had only had 2 minor spasms but when he layed back into bed he had full body parasthesias in which he was grimacing but stated he was not in pain. Due to limited hand function, pt was assisted with opening his meal tray containers.  Pt in bed with all needs met.    Therapy  Documentation Precautions:  Precautions Precautions: Fall Restrictions Weight Bearing Restrictions: No       Pain: no c/o pain   ADL:       See Function Navigator for Current Functional Status.   Therapy/Group: Individual Therapy  [place] 01/22/2017, 12:59 PM

## 2017-01-22 NOTE — Progress Notes (Signed)
Physical Therapy Note  Patient Details  Name: Kenneth Pace MRN: [id number] Date of Birth: 1950/04/10 Today's Date: 01/22/2017    Time: 1300-1400 60 minutes  1:1 No c/o pain at rest, minimal mm spasms during session (only 2-3).  Squat pivot transfers throughout session with mod A, mod A for LE placement for transfers and lifting and pivot assist.  Supine <> sit max A x 2 attempts with total A for LEs, max A for trunk for rolling and coming up to sit.  Manual cervical traction performed 2 x 5 minutes with no changes in symptoms noted.  Mechanical traction performed 5 x 2 minutes with 18# with no change in symptoms. Pt with no pain with traction but also no improvement in sensation.  Seated AAROM for LE musculature with improvement in quad and hamstring mm, still trace glute and hip flexor activation.  Pt performed toilet transfer at end of session with mod A for stand pivot, mod A for clothing management.   Kenneth Pace 01/22/2017, 2:50 PM

## 2017-01-22 NOTE — Progress Notes (Signed)
Patient refused 0200 blood sugar.

## 2017-01-23 ENCOUNTER — Inpatient Hospital Stay (HOSPITAL_COMMUNITY): Payer: BLUE CROSS/BLUE SHIELD | Admitting: Physical Therapy

## 2017-01-23 ENCOUNTER — Inpatient Hospital Stay (HOSPITAL_COMMUNITY): Payer: BLUE CROSS/BLUE SHIELD | Admitting: Occupational Therapy

## 2017-01-23 LAB — CBC
HCT: 40.4 % (ref 39.0–52.0)
Hemoglobin: 14.1 g/dL (ref 13.0–17.0)
MCH: 30.7 pg (ref 26.0–34.0)
MCHC: 34.9 g/dL (ref 30.0–36.0)
MCV: 87.8 fL (ref 78.0–100.0)
PLATELETS: 175 10*3/uL (ref 150–400)
RBC: 4.6 MIL/uL (ref 4.22–5.81)
RDW: 13 % (ref 11.5–15.5)
WBC: 4.1 10*3/uL (ref 4.0–10.5)

## 2017-01-23 LAB — BASIC METABOLIC PANEL
Anion gap: 6 (ref 5–15)
BUN: 19 mg/dL (ref 6–20)
CALCIUM: 8.9 mg/dL (ref 8.9–10.3)
CO2: 26 mmol/L (ref 22–32)
CREATININE: 0.74 mg/dL (ref 0.61–1.24)
Chloride: 103 mmol/L (ref 101–111)
Glucose, Bld: 140 mg/dL — ABNORMAL HIGH (ref 65–99)
Potassium: 4.1 mmol/L (ref 3.5–5.1)
SODIUM: 135 mmol/L (ref 135–145)

## 2017-01-23 LAB — GLUCOSE, CAPILLARY
GLUCOSE-CAPILLARY: 123 mg/dL — AB (ref 65–99)
GLUCOSE-CAPILLARY: 127 mg/dL — AB (ref 65–99)
Glucose-Capillary: 117 mg/dL — ABNORMAL HIGH (ref 65–99)
Glucose-Capillary: 167 mg/dL — ABNORMAL HIGH (ref 65–99)

## 2017-01-23 NOTE — Progress Notes (Signed)
Physical Therapy Session Note  Patient Details  Name: Kenneth Pace MRN: [id number] Date of Birth: 07-06-1950  Today's Date: 01/23/2017 PT Individual Time: 0800-0900 PT Individual Time Calculation (min): 60 min   Short Term Goals: Week 1:  PT Short Term Goal 1 (Week 1): Pt will perform bed mobility with modI PT Short Term Goal 1 - Progress (Week 1): Not progressing PT Short Term Goal 2 (Week 1): Pt will perform stand pivot transfers with consistent S PT Short Term Goal 2 - Progress (Week 1): Not progressing PT Short Term Goal 3 (Week 1): Pt will ambulate 150' with RW and S PT Short Term Goal 3 - Progress (Week 1): Not progressing PT Short Term Goal 4 (Week 1): Pt will ascend/descend four 6" steps with minA PT Short Term Goal 4 - Progress (Week 1): Not progressing  Skilled Therapeutic Interventions/Progress Updates:  Pt was seen bedside in the am. Pt transferred supine to edge of bed with side rail, head of bed elevated with min A and verbal cues with increased time required. Pt transferred edge of bed to w/c with squat pivot mod A transfer. Pt performed toilet transfers with grab bar and min A with verbal cues. In gym treatment focused on sit to stand and stand pivot transfers with min A and verbal cues. While standing worked on weight shifting, posture and reaching. Pt returned to room following treatment. Pt transferred w/c to edge of bed with squat pivot mod A transfer. Pt transferred edge of bed to supine with mod  A and verbal cues. Pt left sitting up in bed with call bell within reach.   Therapy Documentation Precautions:  Precautions Precautions: Fall Restrictions Weight Bearing Restrictions: No General:   Pain: Pt c/o generalized discomfort, no pain.   See Function Navigator for Current Functional Status.   Therapy/Group: Individual Therapy  Dub Amis 01/23/2017, 12:29 PM

## 2017-01-23 NOTE — Plan of Care (Signed)
  Progressing RH BOWEL ELIMINATION RH STG MANAGE BOWEL WITH ASSISTANCE Description STG Manage Bowel with Mod I.   01/23/2017 0019 - Progressing by Edd Arbour, RN RH STG MANAGE BOWEL W/MEDICATION W/ASSISTANCE Description STG Manage Bowel with Medication with Mod I.   01/23/2017 0019 - Progressing by Edd Arbour, RN RH BLADDER ELIMINATION RH STG MANAGE BLADDER WITH ASSISTANCE Description STG Manage Bladder With Mod I   01/23/2017 0019 - Progressing by Edd Arbour, RN RH SKIN INTEGRITY RH STG SKIN FREE OF INFECTION/BREAKDOWN Description With min. A   01/23/2017 0019 - Progressing by Edd Arbour, RN RH STG MAINTAIN SKIN INTEGRITY WITH ASSISTANCE Description STG Maintain Skin Integrity With Supervision.   01/23/2017 0019 - Progressing by Edd Arbour, RN RH SAFETY RH STG ADHERE TO SAFETY PRECAUTIONS W/ASSISTANCE/DEVICE Description STG Adhere to Safety Precautions With supervision.   01/23/2017 0019 - Progressing by Edd Arbour, RN RH STG DECREASED RISK OF FALL WITH ASSISTANCE Description STG Decreased Risk of Fall With Supervision   01/23/2017 0019 - Progressing by Edd Arbour, RN RH PAIN MANAGEMENT RH STG PAIN MANAGED AT OR BELOW PT'S PAIN GOAL Description Pain less than or equal to 2.   01/23/2017 0019 - Progressing by Edd Arbour, RN

## 2017-01-23 NOTE — Progress Notes (Signed)
Physical Therapy Session Note  Patient Details  Name: Kenneth Pace MRN: [id number] Date of Birth: 03-May-1950  Today's Date: 01/23/2017 PT Individual Time: 1300-1330 PT Individual Time Calculation (min): 30 min   Short Term Goals:  Week 2:  PT Short Term Goal 1 (Week 2): Pt will consistently perform functional transfers with min A PT Short Term Goal 2 (Week 2): pt will perform sitting balance without UE support with supervision for functional activities  Skilled Therapeutic Interventions/Progress Updates:   Pt received sitting in WC and agreeable to PT, but reports having little strength due to earlier therapies. .   WC mobility x 169f with supervision assist.  Fine motor and cognitive task to play connect four. Alternating use of RUE and LUE. Min cues for decreased compensatory movements and full ROM  Squat pivot transfers with mod-max assist back to bed. And mac cues for anterior weight shift to prevent LOB.   Sit>supine with mod assist to lift BLE. Pt left in bed with all needs met.       Therapy Documentation Precautions:  Precautions Precautions: Fall Restrictions Weight Bearing Restrictions: No Pain :0/10   See Function Navigator for Current Functional Status.   Therapy/Group: Individual Therapy  ALorie Phenix12/09/2016, 1:34 PM

## 2017-01-23 NOTE — Significant Event (Signed)
Hypoglycemic Event  CBG: 55  Treatment: 15 GM carbohydrate snack  Symptoms: None  Follow-up CBG: Time:2205 CBG Result 71  Possible Reasons for Event: Inadequate meal intake  Comments/MD notified: Patient adjusted insulin pump accordingly and blood sugar increased without incident. Will notify MD in AM    [place], [place]

## 2017-01-23 NOTE — Progress Notes (Signed)
Occupational Therapy Session Note  Patient Details  Name: WHITMAN MEINHARDT MRN: [id number] Date of Birth: 1950/07/31  Today's Date: 01/23/2017 OT Individual Time: 6720-9470 OT Individual Time Calculation (min): 56 min   Short Term Goals: Week 2:  OT Short Term Goal 1 (Week 2): Pt will consistently complete stand pivot transfers with min A OT Short Term Goal 2 (Week 2): Pt will manage pants during LB dressing with steayding assist OT Short Term Goal 3 (Week 2): Pt will stand to complete grooming task with steadying assist  Skilled Therapeutic Interventions/Progress Updates:    Tx focus on trunk control, functional transfers, and activity tolerance.   Pt greeted supine in bed, requesting to use restroom. Supine<sit with Mod A for elevating trunk. Pt opting to transfer via squat pivot (Mod A)<w/c<standard toilet. Pt standing for clothing mgt x2 with Min-Mod A balance assist. After handwashing, pt was escorted to dayroom. Had him engage in various core strengthening activities, including w/c push ups, unweighted medicine ball exercises, and pronounced anterior/posterior weightshifting. Pt often losing balance laterally in w/c, relied heavily on UEs and required cues to acknowledge these compensatory strategies. Pt with max exertion during all exercises, required max cues for diaphragmatic breathing and pacing. Also worked on UE stretches to relieve muscle tension resulting from his increased UE use during transfers. At end of tx pt was escorted back to room, requesting to remain in w/c for lunch. He was left with all needs and RN present.   Therapy Documentation Precautions:  Precautions Precautions: Fall Restrictions Weight Bearing Restrictions: No General:   Vital Signs:   Pain: Pt reported pain to be manageable with provided rest breaks   ADL:   Vision   Perception    Praxis   Exercises:   Other Treatments:    See Function Navigator for Current Functional  Status.   Therapy/Group: Individual Therapy  Jidenna Figgs A Jafeth Mustin 01/23/2017, 12:37 PM

## 2017-01-23 NOTE — Progress Notes (Signed)
[facility]     PROGRESS NOTE  Subjective/Complaints:  Patient describes his conversation with neurosurgery.   No pains this morning.  ROS: Denies nausea, vomiting, diarrhea, shortness of breath or chest pain   Objective: Vital Signs: Blood pressure 123/73, pulse 70, temperature 97.7 F (36.5 C), temperature source Oral, resp. rate 18, height 6' (1.829 m), weight 100.6 kg (221 lb 12.5 oz), SpO2 97 %. No results found. Recent Labs    01/22/17 0435 01/23/17 0536  WBC 4.3 4.1  HGB 14.3 14.1  HCT 41.3 40.4  PLT 178 175   Recent Labs    01/22/17 0435 01/23/17 0536  NA 136 135  K 4.0 4.1  CL 103 103  GLUCOSE 140* 140*  BUN 17 19  CREATININE 0.85 0.74  CALCIUM 9.0 8.9   CBG (last 3)  Recent Labs    01/22/17 2112 01/22/17 2205 01/23/17 0638  GLUCAP 55* 71 123*    Wt Readings from Last 3 Encounters:  01/23/17 100.6 kg (221 lb 12.5 oz)  01/10/17 102.4 kg (225 lb 12 oz)  11/27/16 104.3 kg (230 lb)    Physical Exam:  BP 123/73 (BP Location: Right Arm)   Pulse 70   Temp 97.7 F (36.5 C) (Oral)   Resp 18   Ht 6' (1.829 m)   Wt 100.6 kg (221 lb 12.5 oz)   SpO2 97%   BMI 30.08 kg/m  Constitutional: He appears well-developed and well-nourished.  HENT: Normocephalic and atraumatic.  Eyes: EOM are normal. No discharge. Cardiovascular: RRR. No JVD  Respiratory: CTA Bilaterally. Normal effort    GI: Bowel sounds are normal. He exhibits no distension.   Musculoskeletal: He exhibits no edema or tenderness.  Neurological: He is alert and oriented.  Speech clear.  Cognition intact and able to follow commands without difficulty.  Motor: B/l UE: 5/5 proximal to distal B/l LE: HF 4+/5, ADF/PF 4+/5 (stable) Sensation improving to light touch distal LE and hands Deep tendon reflexes absent in bilateral upper limbs 3+ bilateral patella and nonsustained clonus Achilles Skin: Skin is warm and dry.  Psychiatric: He has a normal mood and  affect. His behavior is normal. Judgment and thought content normal.   Assessment/Plan: 1. 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 Physiatrist is providing close team supervision and 24 hour management of active medical problems listed below. Physiatrist and rehab team continue to assess barriers to discharge/monitor patient progress toward functional and medical goals.  Function:  Bathing Bathing position   Position: Shower  Bathing parts Body parts bathed by patient: Right arm, Right upper leg, Left arm, Left upper leg, Abdomen, Chest, Front perineal area, Right lower leg, Left lower leg, Buttocks Body parts bathed by helper: Back  Bathing assist Assist Level: Supervision or verbal cues      Upper Body Dressing/Undressing Upper body dressing   What is the patient wearing?: Pull over shirt/dress     Pull over shirt/dress - Perfomed by patient: Thread/unthread right sleeve, Thread/unthread left sleeve, Put head through opening, Pull shirt over trunk Pull over shirt/dress - Perfomed by helper: Pull shirt over trunk        Upper body assist Assist Level: More than reasonable time   Set up : To obtain clothing/put away  Lower Body Dressing/Undressing Lower body dressing   What is the patient wearing?: Underwear, Pants, Socks, Non-skid slipper socks Underwear - Performed by patient:  Thread/unthread right underwear leg, Thread/unthread left underwear leg Underwear - Performed by helper: Pull underwear up/down Pants- Performed by patient: Thread/unthread right pants leg, Thread/unthread left pants leg Pants- Performed by helper: Pull pants up/down Non-skid slipper socks- Performed by patient: Don/doff right sock, Don/doff left sock(Sock aid)   Socks - Performed by patient: Don/doff right sock, Don/doff left sock(Sock aid) Socks - Performed by helper:  Don/doff right sock, Don/doff left sock Shoes - Performed by patient: Don/doff right shoe, Don/doff left shoe Shoes - Performed by helper: Don/doff right shoe, Don/doff left shoe          Lower body assist Assist for lower body dressing: Touching or steadying assistance (Pt > 75%)      Toileting Toileting   Toileting steps completed by patient: Adjust clothing prior to toileting, Performs perineal hygiene Toileting steps completed by helper: Adjust clothing after toileting Toileting Assistive Devices: Grab bar or rail  Toileting assist Assist level: Two helpers(fatigue)   Transfers Chair/bed transfer   Chair/bed transfer method: Lateral scoot Chair/bed transfer assist level: Touching or steadying assistance (Pt > 75%) Chair/bed transfer assistive device: Sliding board     Locomotion Ambulation     Max distance: 10 Assist level: Touching or steadying assistance (Pt > 75%)   Wheelchair   Type: Manual Max wheelchair distance: 5' Assist Level: Supervision or verbal cues  Cognition Comprehension Comprehension assist level: Follows basic conversation/direction with extra time/assistive device  Expression Expression assist level: Expresses complex ideas: With extra time/assistive device  Social Interaction Social Interaction assist level: Interacts appropriately with others - No medications needed.  Problem Solving Problem solving assist level: Solves complex problems: With extra time  Memory Memory assist level: Complete Independence: No helper    Medical Problem List and Plan: 1.  Weakness, limitations with ADLs secondary to demyelinating myeloneuropathy, completed plasmapheresis on 11/24   Cont CIR   Neurology consulted, recommending trial of IVIG x3, day 3/3 today.   No immediate plans for surgery, monitor recovery after IVIG completed   Discussed trial of traction with therapies. 2.  DVT Prophylaxis/Anticoagulation: Pharmaceutical: Lovenox 3. Pain Management:     Flexeril PRN for muscle spasms with improvement   Gabapentin increased to 300 TID on 12/3, increased to 600 3 times a day on 12/5 - improved 4. Mood: Remains optimistic. LCSW to follow for evaluation and support.  5. Neuropsych: This patient is capable of making decisions on his own behalf. 6. Skin/Wound Care: Routine pressure relief measures. Maintain adequate nutritional and hydration status.  7. Fluids/Electrolytes/Nutrition: Monitor I/Os. Added nutritional supplement to help promote healing and for BS stabilization.    BMP within acceptable range on 12/7 9.T2DM: Hgb A1c- 7.3 (managed by Dr. Loanne Drilling) Monitor BS ac/hs. Has insulin pump with monitor.  P.m. hypoglycemia yesterday, make sure patient is eating meals regularly may need a snack    CBG (last 3)  Recent Labs    01/22/17 2112 01/22/17 2205 01/23/17 0638  GLUCAP 55* 71 123*      Monitor with increased mobility 10. BPH/Neurogenic bladder: On finasteride in am.  11. HTN: Monitor BP bid. Continue lisinopril Vitals:   01/22/17 2010 01/23/17 0511  BP: 125/77 123/73  Pulse: 74 70  Resp: 10 18  Temp: 98.6 F (37 C) 97.7 F (36.5 C)  SpO2: 100% 97%     Controlled on 12/7 12. Anxiety disorder: Due to work stress and has been managed with buspar.  13. Constipation: Resolved with bowel regimen of miralax bid.  LOS (Days)  [place] EVALUATION WAS PERFORMED  Charlett Blake 01/23/2017 11:42 AM

## 2017-01-24 ENCOUNTER — Inpatient Hospital Stay (HOSPITAL_COMMUNITY): Payer: BLUE CROSS/BLUE SHIELD

## 2017-01-24 LAB — GLUCOSE, CAPILLARY
GLUCOSE-CAPILLARY: 161 mg/dL — AB (ref 65–99)
GLUCOSE-CAPILLARY: 241 mg/dL — AB (ref 65–99)
Glucose-Capillary: 110 mg/dL — ABNORMAL HIGH (ref 65–99)
Glucose-Capillary: 215 mg/dL — ABNORMAL HIGH (ref 65–99)

## 2017-01-24 LAB — CBC
HEMATOCRIT: 40.3 % (ref 39.0–52.0)
HEMOGLOBIN: 14.1 g/dL (ref 13.0–17.0)
MCH: 30.7 pg (ref 26.0–34.0)
MCHC: 35 g/dL (ref 30.0–36.0)
MCV: 87.8 fL (ref 78.0–100.0)
Platelets: 161 10*3/uL (ref 150–400)
RBC: 4.59 MIL/uL (ref 4.22–5.81)
RDW: 13.1 % (ref 11.5–15.5)
WBC: 3.5 10*3/uL — ABNORMAL LOW (ref 4.0–10.5)

## 2017-01-24 LAB — BASIC METABOLIC PANEL
ANION GAP: 7 (ref 5–15)
BUN: 17 mg/dL (ref 6–20)
CO2: 25 mmol/L (ref 22–32)
Calcium: 8.9 mg/dL (ref 8.9–10.3)
Chloride: 104 mmol/L (ref 101–111)
Creatinine, Ser: 0.75 mg/dL (ref 0.61–1.24)
GFR calc Af Amer: >60 mL/min (ref >60)
GFR calc non Af Amer: >60 mL/min (ref >60)
GLUCOSE: 62 mg/dL — AB (ref 65–99)
POTASSIUM: 3.7 mmol/L (ref 3.5–5.1)
Sodium: 136 mmol/L (ref 135–145)

## 2017-01-24 LAB — STRIATED MUSCLE ANTIBODY: Anti-striation Abs: NEGATIVE

## 2017-01-24 MED ORDER — TAMSULOSIN HCL 0.4 MG PO CAPS
0.4000 mg | ORAL_CAPSULE | Freq: Every day | ORAL | Status: DC
  Administered 2017-01-24 – 2017-01-31 (×8): 0.4 mg via ORAL
  Filled 2017-01-24 (×8): qty 1

## 2017-01-24 NOTE — Plan of Care (Signed)
  RH BLADDER ELIMINATION RH STG MANAGE BLADDER WITH ASSISTANCE Description STG Manage Bladder With Mod I  ; I and O cath with high volumes 01/24/2017 1440 - Not Progressing by Ander Slade, RN

## 2017-01-24 NOTE — Progress Notes (Signed)
RT NOTE:  Pt has home CPAP @ bedside. Pt puts on when he is ready for bed. RT available if needed.

## 2017-01-24 NOTE — Progress Notes (Signed)
Occupational Therapy Session Note  Patient Details  Name: Kenneth Pace MRN: [id number] Date of Birth: 09-13-1950  Today's Date: 01/24/2017 OT Individual Time: 5940-9050 OT Individual Time Calculation (min): 42 min    Short Term Goals: Week 1:  OT Short Term Goal 1 (Week 1): Pt will complete 2/3 toileting tasks with steadying assist OT Short Term Goal 1 - Progress (Week 1): Not met OT Short Term Goal 2 (Week 1): Pt will don shirt with set-up OT Short Term Goal 2 - Progress (Week 1): Met OT Short Term Goal 3 (Week 1): Pt will don pants and socks using AE PRN with min A OT Short Term Goal 3 - Progress (Week 1): Met OT Short Term Goal 4 (Week 1): Pt will complete grooming task in standing with CGA for balance OT Short Term Goal 4 - Progress (Week 1): Not met  Skilled Therapeutic Interventions/Progress Updates:    1;1. Pt completes supine>sitting EOB with Min A for trunk elevation. Pt reporting pain in deltoid after transfer in to bed for I and O cath, but says it is manageable. Pt sitting EOB dons shoes with overall total. OT demo shoe funnel with donning L shoe since R heel of shoe collapsed despite use of Manata. Pt unable to lift BLE enough to place toes into shoes or slide heel down shoe funnel side without A. Pt transfer throughout session with MOD A squat pivot EOB<>w/c with Vc for trunk flexion and hand placement. Pt works on [place] flipping scrabble tiles and finger>palm translation with dropping decreasing with practice. Exited session with pt seated in bed with call light in reach and all needs met.   Therapy Documentation Precautions:  Precautions Precautions: Fall Restrictions Weight Bearing Restrictions: No General:    See Function Navigator for Current Functional Status.   Therapy/Group: Individual Therapy  Tonny Branch 01/24/2017, 4:28 PM

## 2017-01-24 NOTE — Plan of Care (Signed)
  Progressing RH BOWEL ELIMINATION RH STG MANAGE BOWEL WITH ASSISTANCE Description STG Manage Bowel with Mod I.   01/24/2017 3300 - Progressing by Edd Arbour, RN RH STG MANAGE BOWEL W/MEDICATION W/ASSISTANCE Description STG Manage Bowel with Medication with Mod I.   01/24/2017 0146 - Progressing by Edd Arbour, RN RH SKIN INTEGRITY RH STG SKIN FREE OF INFECTION/BREAKDOWN Description With min. A   01/24/2017 0146 - Progressing by Edd Arbour, RN RH STG MAINTAIN SKIN INTEGRITY WITH ASSISTANCE Description STG Maintain Skin Integrity With Supervision.   01/24/2017 0146 - Progressing by Edd Arbour, RN RH SAFETY RH STG ADHERE TO SAFETY PRECAUTIONS W/ASSISTANCE/DEVICE Description STG Adhere to Safety Precautions With supervision.   01/24/2017 0146 - Progressing by Edd Arbour, RN RH STG DECREASED RISK OF FALL WITH ASSISTANCE Description STG Decreased Risk of Fall With Supervision   01/24/2017 0146 - Progressing by Edd Arbour, RN RH PAIN MANAGEMENT RH STG PAIN MANAGED AT OR BELOW PT'S PAIN GOAL Description Pain less than or equal to 2.   01/24/2017 0146 - Progressing by Edd Arbour, RN

## 2017-01-24 NOTE — Progress Notes (Signed)
Occupational Therapy Session Note  Patient Details  Name: Kenneth Pace MRN: [id number] Date of Birth: [birth year]  Today's Date: 01/24/2017 OT Individual Time: 1100-1200 OT Individual Time Calculation (min): 60 min    Short Term Goals: Week 1:  OT Short Term Goal 1 (Week 1): Pt will complete 2/3 toileting tasks with steadying assist OT Short Term Goal 1 - Progress (Week 1): Not met OT Short Term Goal 2 (Week 1): Pt will don shirt with set-up OT Short Term Goal 2 - Progress (Week 1): Met OT Short Term Goal 3 (Week 1): Pt will don pants and socks using AE PRN with min A OT Short Term Goal 3 - Progress (Week 1): Met OT Short Term Goal 4 (Week 1): Pt will complete grooming task in standing with CGA for balance OT Short Term Goal 4 - Progress (Week 1): Not met  Skilled Therapeutic Interventions/Progress Updates:    1:1. Pt requesting to shower this date. No c/o pain. Pt lateral scoot transfer with touching A for safety EOB>w/c<>TTB with VC for BLE management and foot placement throughout trnasfer. Pt bathes at seated level leaning laterally for washing buttocks. OT washes back and B feet. Pt requires 3 trials to reach to high shelf to grab soap bar. Pt dresses at sit to stand level donning shirt with supervision. Pt uses reacher to thread BLE into pants with Vc for technique. Pt sit to stand with min A to power up as OT advances pants past hips. Pt dons B socks with sock aide and grooms at sink with supervision. Exited session with pt seated in w/c awaiting lunch.    Therapy Documentation Precautions:  Precautions Precautions: Fall Restrictions Weight Bearing Restrictions: No  See Function Navigator for Current Functional Status.   Therapy/Group: Individual Therapy  Tonny Branch 01/24/2017, 11:49 AM

## 2017-01-24 NOTE — Progress Notes (Signed)
[place] PHYSICAL MEDICINE & REHABILITATION     PROGRESS NOTE  Subjective/Complaints:  Bladder not emptying , req cath last noc per pt  ROS: Denies nausea, vomiting, diarrhea, shortness of breath or chest pain   Objective: Vital Signs: Blood pressure 117/71, pulse 70, temperature 97.6 F (36.4 C), temperature source Oral, resp. rate 18, height 6' (1.829 m), weight 101 kg (222 lb 10.6 oz), SpO2 99 %. No results found. Recent Labs    01/23/17 0536 01/24/17 0505  WBC 4.1 3.5*  HGB 14.1 14.1  HCT 40.4 40.3  PLT 175 161   Recent Labs    01/23/17 0536 01/24/17 0505  NA 135 136  K 4.1 3.7  CL 103 104  GLUCOSE 140* 62*  BUN 19 17  CREATININE 0.74 0.75  CALCIUM 8.9 8.9   CBG (last 3)  Recent Labs    01/23/17 1616 01/23/17 2014 01/24/17 0640  GLUCAP 167* 127* 110*    Wt Readings from Last 3 Encounters:  01/24/17 101 kg (222 lb 10.6 oz)  01/10/17 102.4 kg (225 lb 12 oz)  11/27/16 104.3 kg (230 lb)    Physical Exam:  BP 117/71 (BP Location: Left Arm)   Pulse 70   Temp 97.6 F (36.4 C) (Oral)   Resp 18   Ht 6' (1.829 m)   Wt 101 kg (222 lb 10.6 oz)   SpO2 99%   BMI 30.20 kg/m  Constitutional: He appears well-developed and well-nourished.  HENT: Normocephalic and atraumatic.  Eyes: EOM are normal. No discharge. Cardiovascular: RRR. No JVD  Respiratory: CTA Bilaterally. Normal effort    GI: Bowel sounds are normal. He exhibits no distension.   Musculoskeletal: He exhibits no edema or tenderness.  Neurological: He is alert and oriented.  Speech clear.  Cognition intact and able to follow commands without difficulty.  Motor: B/l UE: 5/5 proximal to distal B/l LE: HF 4+/5, ADF/PF 4+/5 (stable) Sensation improving to light touch distal LE and hands Deep tendon reflexes absent in bilateral upper limbs 3+ bilateral patella and nonsustained clonus Achilles Skin: Skin is warm and dry.  Psychiatric: He has a normal mood and affect. His behavior is normal.  Judgment and thought content normal.   Assessment/Plan: 1. 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 Physiatrist is providing close team supervision and 24 hour management of active medical problems listed below. Physiatrist and rehab team continue to assess barriers to discharge/monitor patient progress toward functional and medical goals.  Function:  Bathing Bathing position   Position: Shower  Bathing parts Body parts bathed by patient: Right arm, Right upper leg, Left arm, Left upper leg, Abdomen, Chest, Front perineal area, Right lower leg, Left lower leg, Buttocks Body parts bathed by helper: Back  Bathing assist Assist Level: Supervision or verbal cues      Upper Body Dressing/Undressing Upper body dressing   What is the patient wearing?: Pull over shirt/dress     Pull over shirt/dress - Perfomed by patient: Thread/unthread right sleeve, Thread/unthread left sleeve, Put head through opening, Pull shirt over trunk Pull over shirt/dress - Perfomed by helper: Pull shirt over trunk        Upper body assist Assist Level: More than reasonable time   Set up : To obtain clothing/put away  Lower Body Dressing/Undressing Lower body dressing   What is the patient wearing?: Underwear, Pants, Socks, Non-skid slipper socks Underwear - Performed by patient: Thread/unthread right  underwear leg, Thread/unthread left underwear leg Underwear - Performed by helper: Pull underwear up/down Pants- Performed by patient: Thread/unthread right pants leg, Thread/unthread left pants leg Pants- Performed by helper: Pull pants up/down Non-skid slipper socks- Performed by patient: Don/doff right sock, Don/doff left sock(Sock aid)   Socks - Performed by patient: Don/doff right sock, Don/doff left sock(Sock aid) Socks - Performed by helper: Don/doff right sock, Don/doff left  sock Shoes - Performed by patient: Don/doff right shoe, Don/doff left shoe Shoes - Performed by helper: Don/doff right shoe, Don/doff left shoe          Lower body assist Assist for lower body dressing: Touching or steadying assistance (Pt > 75%)      Toileting Toileting   Toileting steps completed by patient: Adjust clothing prior to toileting, Performs perineal hygiene Toileting steps completed by helper: Adjust clothing after toileting Toileting Assistive Devices: Grab bar or rail  Toileting assist Assist level: Touching or steadying assistance (Pt.75%)   Transfers Chair/bed transfer   Chair/bed transfer method: Stand pivot Chair/bed transfer assist level: Touching or steadying assistance (Pt > 75%) Chair/bed transfer assistive device: Medical sales representative     Max distance: 10 Assist level: Touching or steadying assistance (Pt > 75%)   Wheelchair   Type: Manual Max wheelchair distance: 75' Assist Level: Supervision or verbal cues  Cognition Comprehension Comprehension assist level: Follows basic conversation/direction with extra time/assistive device  Expression Expression assist level: Expresses complex ideas: With extra time/assistive device  Social Interaction Social Interaction assist level: Interacts appropriately with others - No medications needed.  Problem Solving Problem solving assist level: Solves complex problems: With extra time  Memory Memory assist level: Complete Independence: No helper    Medical Problem List and Plan: 1.  Weakness, limitations with ADLs secondary to demyelinating myeloneuropathy, completed plasmapheresis on 11/24   Cont CIR   Neurology consulted, completed IVIG   No immediate plans for surgery, monitor recovery after IVIG   2.  DVT Prophylaxis/Anticoagulation: Pharmaceutical: Lovenox 3. Pain Management:    Flexeril PRN for muscle spasms with improvement   Gabapentin increased to 300 TID on 12/3, increased to 600 3  times a day on 12/5 - improved 4. Mood: Remains optimistic. LCSW to follow for evaluation and support.  5. Neuropsych: This patient is capable of making decisions on his own behalf. 6. Skin/Wound Care: Routine pressure relief measures. Maintain adequate nutritional and hydration status.  7. Fluids/Electrolytes/Nutrition: Monitor I/Os. Added nutritional supplement to help promote healing and for BS stabilization.    BMP within acceptable range on 12/7 9.T2DM: Hgb A1c- 7.3 (managed by Dr. Loanne Drilling) Monitor BS ac/hs. Has insulin pump with monitor.  P.m. hypoglycemia yesterday, make sure patient is eating meals regularly may need a snack    CBG (last 3)  Recent Labs    01/23/17 1616 01/23/17 2014 01/24/17 0640  GLUCAP 167* 127* 110*      Monitor with increased mobility 10. BPH/Neurogenic bladder: On finasteride in am. Add flomax 11. HTN: Monitor BP bid. Continue lisinopril Vitals:   01/23/17 1430 01/24/17 0500  BP: 122/71 117/71  Pulse: 80 70  Resp: 18 18  Temp: 98.4 F (36.9 C) 97.6 F (36.4 C)  SpO2: 99% 99%     Controlled on 12/9 12. Anxiety disorder: Due to work stress and has been managed with buspar.  13. Constipation: Resolved with bowel regimen of miralax bid.  LOS (Days) [place] PERFORMED  Luanna Salk Lielle Vandervort  01/24/2017 9:57 AM

## 2017-01-24 NOTE — Progress Notes (Addendum)
Reason for consult:   Subjective: Patient feel somewhat improved since starting IVIG although states he fatigues at the end of the day. He also stated he has urinary retention today.    ROS: negative except above   Examination  Vital signs in last 24 hours: Temp:  [97.6 F (36.4 C)-97.7 F (36.5 C)] 97.7 F (36.5 C) (12/09 1626) Pulse Rate:  [70-73] 73 (12/09 1626) Resp:  [18-19] 19 (12/09 1626) BP: (117)/(71-81) 117/81 (12/09 1626) SpO2:  [98 %-99 %] 98 % (12/09 1626) Weight:  [101 kg (222 lb 10.6 oz)] 101 kg (222 lb 10.6 oz) (12/09 0500)  General: Not in distress, cooperative CVS: pulse-normal rate and rhythm RS: breathing comfortably Extremities: normal   Neuro: MS: Alert, oriented, follows commands CN: pupils equal and reactive,  EOMI, face symmetric, tongue midline, normal sensation over face, Motor: UE:  4+/5 over deltoids, biceps. 4/5 hand grip bilaterally. LE: 3/5 over hip flexor, 4/5 knee flexor and extensor, 4+/5 plantar flexion and extensors.  Reflexes: 3+ over both patella, UE: 1+ over biceps, brachioradialis, Hoffman's negative   Coordination: normal Gait: not tested  Basic Metabolic Panel: Recent Labs  Lab 01/18/17 1750 01/22/17 0435 01/23/17 0536 01/24/17 0505  NA 135 136 135 136  K 3.7 4.0 4.1 3.7  CL 102 103 103 104  CO2 26 28 26 25   GLUCOSE 154* 140* 140* 62*  BUN 20 17 19 17   CREATININE 0.82 0.85 0.74 0.75  CALCIUM 9.0 9.0 8.9 8.9    CBC: Recent Labs  Lab 01/18/17 1750 01/22/17 0435 01/23/17 0536 01/24/17 0505  WBC 8.1 4.3 4.1 3.5*  HGB 14.5 14.3 14.1 14.1  HCT 41.9 41.3 40.4 40.3  MCV 88.0 87.9 87.8 87.8  PLT 219 178 175 161     Coagulation Studies: No results for input(s): LABPROT, INR in the last 72 hours.  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  We had discussed with patient and wife and felt that  there were likely 2 processes going on - CIDP given elevated protein and respiratory distress, however cervical myelopathy given urinary retention, Lhermitte sign and presence of reflexes in lower extremity. Patient wanted to wait to try IVIG as he had improved in the past  before considering neurosurgical intervention. He received 3 days of IVIG.   Weakness has definitely improved since last assessment more so in upper extremities than lower extremities. However patient continues to be significantly weak in both LE, mainly proximal muscles. Also complaining of bladder retention which would favor myelopathy.  Will continue to follow.    Karena Addison Aroor [facility] Pager Number [phone] For questions after 7pm please refer to AMION to reach the Neurologist on call

## 2017-01-24 NOTE — Progress Notes (Signed)
Physical Therapy Session Note  Patient Details  Name: Kenneth Pace MRN: [id number] Date of Birth: 01-13-1951  Today's Date: 01/24/2017 PT Individual Time: 0800-0900 PT Individual Time Calculation (min): 60 min   Short Term Goals: Week 2:  PT Short Term Goal 1 (Week 2): Pt will consistently perform functional transfers with min A PT Short Term Goal 2 (Week 2): pt will perform sitting balance without UE support with supervision for functional activities  Skilled Therapeutic Interventions/Progress Updates:    Pt supine in bed upon PT arrival, agreeable to therapy tx and denies pain. Pt performed squat pivot transfers throughout session x2 with min A.  Pt propelled w/c x 50 ft using B UEs to the gym, limited by fatigue. Pt transferred to mat and sitting>supine with mod assist. In supine therapist performed manual cervical traction 5 x 1 min, therapist also performed gentle cervical ROM and stretching.  Pt performed supine NMR with focus on proximal and core muscle strengthening: hip IR/ER in hooklying, with feet on theraball hip/knee flex/ext, and SAQ. Pt requires assist for all motions and frequent rests due to fatigue.  Supine to sit with mod A from mat. Pt transported back to room in w/c and left supine in bed with needs in reach.    Therapy Documentation Precautions:  Precautions Precautions: Fall Restrictions Weight Bearing Restrictions: No   See Function Navigator for Current Functional Status.   Therapy/Group: Individual Therapy  Drema Dallas Schagen,PT, DPT 01/24/2017, 7:45 AM

## 2017-01-25 ENCOUNTER — Inpatient Hospital Stay (HOSPITAL_COMMUNITY): Payer: BLUE CROSS/BLUE SHIELD

## 2017-01-25 ENCOUNTER — Inpatient Hospital Stay (HOSPITAL_COMMUNITY): Payer: BLUE CROSS/BLUE SHIELD | Admitting: Occupational Therapy

## 2017-01-25 ENCOUNTER — Inpatient Hospital Stay (HOSPITAL_COMMUNITY): Payer: BLUE CROSS/BLUE SHIELD | Admitting: Physical Therapy

## 2017-01-25 LAB — BASIC METABOLIC PANEL
ANION GAP: 6 (ref 5–15)
BUN: 17 mg/dL (ref 6–20)
CALCIUM: 8.7 mg/dL — AB (ref 8.9–10.3)
CO2: 26 mmol/L (ref 22–32)
Chloride: 102 mmol/L (ref 101–111)
Creatinine, Ser: 0.79 mg/dL (ref 0.61–1.24)
GFR calc Af Amer: >60 mL/min (ref >60)
GLUCOSE: 143 mg/dL — AB (ref 65–99)
Potassium: 4 mmol/L (ref 3.5–5.1)
SODIUM: 134 mmol/L — AB (ref 135–145)

## 2017-01-25 LAB — CBC
HCT: 42.8 % (ref 39.0–52.0)
Hemoglobin: 15 g/dL (ref 13.0–17.0)
MCH: 30.7 pg (ref 26.0–34.0)
MCHC: 35 g/dL (ref 30.0–36.0)
MCV: 87.7 fL (ref 78.0–100.0)
Platelets: 164 10*3/uL (ref 150–400)
RBC: 4.88 MIL/uL (ref 4.22–5.81)
RDW: 12.9 % (ref 11.5–15.5)
WBC: 3.9 10*3/uL — ABNORMAL LOW (ref 4.0–10.5)

## 2017-01-25 LAB — GLUCOSE, CAPILLARY
GLUCOSE-CAPILLARY: 110 mg/dL — AB (ref 65–99)
GLUCOSE-CAPILLARY: 115 mg/dL — AB (ref 65–99)
GLUCOSE-CAPILLARY: 189 mg/dL — AB (ref 65–99)
GLUCOSE-CAPILLARY: 182 mg/dL — AB (ref 65–99)
Glucose-Capillary: 203 mg/dL — ABNORMAL HIGH (ref 65–99)
Glucose-Capillary: 190 mg/dL — ABNORMAL HIGH (ref 65–99)

## 2017-01-25 NOTE — Progress Notes (Signed)
Subjective: Feels like he is getting worse.   Exam: Vitals:   01/24/17 2128 01/25/17 0327  BP: (!) 144/76 (!) 149/78  Pulse:  75  Resp:  18  Temp:  97.7 F (36.5 C)  SpO2:  96%   Gen: In bed, NAD Resp: non-labored breathing, no acute distress Abd: soft, nt  Neuro: MS: Awake, alert,  ZR:AQTMA, EOMI, VFF Motor: He has 3/5 strength at the hips, 4/5 distally Sensory:decreased to LT to the thighs, to just below the shoulders in UE DTR:3+ and symmetric at the knees, non-sustained clonus on the right.   Impression: 66 yo M with progressive weakness, numbness, urinary retention, hyperreflexia. He had demyelinating changes on EMG, elevated CSF protein, and family felt he had some benefit from initial IVIG which has clouded the picture somewhat. He now has undergone PLEX and repeat IVIG and is worsening.   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 Still unclear how much of his symptoms are due to a superimposed neuropathy.   Recommendations: 1) I have discussed with Dr. Kathyrn Sheriff who will see the patient tomorrow. Appreciate his assistance.  2) continue neurontin.    Roland Rack, MD [facility] [phone]  If 7pm- 7am, please page neurology on call as listed in [place].

## 2017-01-25 NOTE — Progress Notes (Signed)
[place] PHYSICAL MEDICINE & REHABILITATION     PROGRESS NOTE  Subjective/Complaints:  Pt seen sitting up in bed this AM.  He states he slept well overnight, but notes urinary retention now, with increasing numbness and weakness.   ROS: Denies nausea, vomiting, diarrhea, shortness of breath or chest pain   Objective: Vital Signs: Blood pressure (!) 149/78, pulse 75, temperature 97.7 F (36.5 C), temperature source Oral, resp. rate 18, height 6' (1.829 m), weight 101 kg (222 lb 10.6 oz), SpO2 96 %. No results found. Recent Labs    01/24/17 0505 01/25/17 0530  WBC 3.5* 3.9*  HGB 14.1 15.0  HCT 40.3 42.8  PLT 161 164   Recent Labs    01/24/17 0505 01/25/17 0530  NA 136 134*  K 3.7 4.0  CL 104 102  GLUCOSE 62* 143*  BUN 17 17  CREATININE 0.75 0.79  CALCIUM 8.9 8.7*   CBG (last 3)  Recent Labs    01/24/17 2130 01/25/17 0220 01/25/17 0634  GLUCAP 241* 110* 115*    Wt Readings from Last 3 Encounters:  01/24/17 101 kg (222 lb 10.6 oz)  01/10/17 102.4 kg (225 lb 12 oz)  11/27/16 104.3 kg (230 lb)    Physical Exam:  BP (!) 149/78 (BP Location: Left Arm)   Pulse 75   Temp 97.7 F (36.5 C) (Oral)   Resp 18   Ht 6' (1.829 m)   Wt 101 kg (222 lb 10.6 oz)   SpO2 96%   BMI 30.20 kg/m  Constitutional: He appears well-developed and well-nourished.  HENT: Normocephalic and atraumatic.  Eyes: EOM are normal. No discharge. Cardiovascular: RRR. No JVD  Respiratory: CTA Bilaterally. Normal effort    GI: Bowel sounds are normal. He exhibits no distension.   Musculoskeletal: He exhibits no edema or tenderness.  Neurological: He is alert and oriented.  Speech clear.  Cognition intact and able to follow commands without difficulty.  Motor: B/l UE: 4+/5 proximal to distal B/l LE: HF 3/5, KE 4/5, 4+/5 ADF/PF  Sensation to light touch diminished >> distal LE, but also now ascending Skin: Skin is warm and dry.  Psychiatric: He has a normal mood and affect. His behavior is  normal. Judgment and thought content normal.   Assessment/Plan: 1. 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 Physiatrist is providing close team supervision and 24 hour management of active medical problems listed below. Physiatrist and rehab team continue to assess barriers to discharge/monitor patient progress toward functional and medical goals.  Function:  Bathing Bathing position   Position: Shower  Bathing parts Body parts bathed by patient: Right arm, Right upper leg, Left arm, Left upper leg, Abdomen, Chest, Front perineal area, Right lower leg, Left lower leg, Buttocks Body parts bathed by helper: Back  Bathing assist Assist Level: Supervision or verbal cues      Upper Body Dressing/Undressing Upper body dressing   What is the patient wearing?: Pull over shirt/dress     Pull over shirt/dress - Perfomed by patient: Thread/unthread right sleeve, Thread/unthread left sleeve, Put head through opening, Pull shirt over trunk Pull over shirt/dress - Perfomed by helper: Pull shirt over trunk        Upper body assist Assist Level: More than reasonable time   Set up : To obtain clothing/put away  Lower Body Dressing/Undressing Lower body dressing   What is the patient wearing?: Underwear, Pants, Socks, Non-skid slipper  socks Underwear - Performed by patient: Thread/unthread right underwear leg, Thread/unthread left underwear leg Underwear - Performed by helper: Pull underwear up/down Pants- Performed by patient: Thread/unthread right pants leg, Thread/unthread left pants leg Pants- Performed by helper: Pull pants up/down Non-skid slipper socks- Performed by patient: Don/doff right sock, Don/doff left sock(Sock aid)   Socks - Performed by patient: Don/doff right sock, Don/doff left sock(Sock aid) Socks - Performed by helper: Don/doff right sock, Don/doff  left sock Shoes - Performed by patient: Don/doff right shoe, Don/doff left shoe Shoes - Performed by helper: Don/doff right shoe, Don/doff left shoe          Lower body assist Assist for lower body dressing: Touching or steadying assistance (Pt > 75%)      Toileting Toileting   Toileting steps completed by patient: Adjust clothing prior to toileting, Performs perineal hygiene Toileting steps completed by helper: Adjust clothing after toileting Toileting Assistive Devices: Grab bar or rail  Toileting assist Assist level: Touching or steadying assistance (Pt.75%)   Transfers Chair/bed transfer   Chair/bed transfer method: Squat pivot Chair/bed transfer assist level: Touching or steadying assistance (Pt > 75%) Chair/bed transfer assistive device: Medical sales representative     Max distance: 10 Assist level: Touching or steadying assistance (Pt > 75%)   Wheelchair   Type: Manual Max wheelchair distance: 29' Assist Level: Supervision or verbal cues  Cognition Comprehension Comprehension assist level: Follows basic conversation/direction with extra time/assistive device  Expression Expression assist level: Expresses complex ideas: With extra time/assistive device  Social Interaction Social Interaction assist level: Interacts appropriately with others - No medications needed.  Problem Solving Problem solving assist level: Solves complex problems: With extra time  Memory Memory assist level: Complete Independence: No helper    Medical Problem List and Plan: 1.  Weakness, limitations with ADLs secondary to demyelinating myeloneuropathy, completed plasmapheresis on 11/24   Cont CIR   Neurology consulted, completed IVIG, will discuss with Neurology again as patient weakness/numbness appear to be progressing, now with urinary retention, last note reviewed- plans to monitor, appreciate recs   No immediate plans for surgery, monitor recovery after IVIG  2.  DVT  Prophylaxis/Anticoagulation: Pharmaceutical: Lovenox 3. Pain Management:    Flexeril PRN for muscle spasms with improvement   Gabapentin increased to 300 TID on 12/3, increased to 600 3 times a day on 12/5 - improved 4. Mood: Remains optimistic. LCSW to follow for evaluation and support.  5. Neuropsych: This patient is capable of making decisions on his own behalf. 6. Skin/Wound Care: Routine pressure relief measures. Maintain adequate nutritional and hydration status.  7. Fluids/Electrolytes/Nutrition: Monitor I/Os. Added nutritional supplement to help promote healing and for BS stabilization.  9.T2DM: Hgb A1c- 7.3 (managed by Dr. Loanne Drilling) Monitor BS ac/hs. Has insulin pump with monitor.  P.m. hypoglycemia yesterday, make sure patient is eating meals regularly may need a snack    CBG (last 3)  Recent Labs    01/24/17 2130 01/25/17 0220 01/25/17 0634  GLUCAP 241* 110* 115*      Monitor with increased mobility   Remains labile on 12/10 10. BPH/Neurogenic bladder: On finasteride in am. Added flomax   Now with significant retention 11. HTN: Monitor BP bid. Continue lisinopril Vitals:   01/24/17 2128 01/25/17 0327  BP: (!) 144/76 (!) 149/78  Pulse:  75  Resp:  18  Temp:  97.7 F (36.5 C)  SpO2:  96%    Relatively controlled on 12/9  12. Anxiety disorder:  Due to work stress and has been managed with buspar.  13. Constipation: Resolved with bowel regimen of miralax bid. 14. Hyponatremia   Na 134 on 12/10   Cont to monitor  LOS (Days) 14 A FACE TO FACE EVALUATION WAS PERFORMED  Jarelyn Bambach Lorie Phenix 01/25/2017 9:03 AM

## 2017-01-25 NOTE — Progress Notes (Signed)
Physical Therapy Note  Patient Details  Name: Kenneth Pace MRN: [id number] Date of Birth: November 30, 1950 Today's Date: 01/25/2017    Time: 1300-1350 50 minutes  1:1 No c/o pain.  Pt performs supine to sit with mod A (improved technique and use of bed rails).  Sitting balance edge of bed with supervision with UE reaching activity.  Pt states "it feels like it's hard to breath out".  RN made aware, vitals WNL.  Squat pivot transfers throughout session with mod A.  W/c mobility supervision x 100'.  Sit to stand in parallel bars x 5 for 1 min.  Pt able stand without UE support 2 x 30 seconds.  Pt requires mod A to stand.   Eusevio Schriver 01/25/2017, 1:55 PM

## 2017-01-25 NOTE — Progress Notes (Signed)
Occupational Therapy Session Note  Patient Details  Name: Kenneth Pace MRN: [id number] Date of Birth: 12-Oct-1950  Today's Date: 01/25/2017 OT Individual Time: 0800-0908 OT Individual Time Calculation (min): 68 min    Short Term Goals: Week 2:  OT Short Term Goal 1 (Week 2): Pt will consistently complete stand pivot transfers with min A OT Short Term Goal 2 (Week 2): Pt will manage pants during LB dressing with steayding assist OT Short Term Goal 3 (Week 2): Pt will stand to complete grooming task with steadying assist  Skilled Therapeutic Interventions/Progress Updates:    Pt seen for OT session focusing on ADL re-training and functional mobility. Pt in supine upon arrival, told therapist events of the weekend including new need for in/out cath. Pt beginning to seem discouraged at new loss of function.  He transferred to EOB with min A using hospital bed functions. Seated EOB, he doffed/donned shirt and shoes, requiring guarding assist for dynamic sitting balance, LH shoe horn used to assist with shoes.  Completed min A sliding board transfer to w/c, able to place sliding board independently.  Grooming tasks completed from w/c level with increased time for fine motor needs. Therapist retrieved ultra-lightweight w/c. Pt stood at [facility] with max A with max- total reliance on UEs to power into standing. He then sat in ultra light weight w/c. Practiced w/c mobility throughout unit, pt voiced feeling utra lightweight chair easier to turn and maneuver. Attempted to find w/c gloves for pt, unsuccessful but pt reports he has some at home. Education and demonstration provided for w/c part management for ultraleight weight chair. At end of session, assisted pt back to bed. Required max A sliding board transfer due to fatigue, mod A to return to supine. Pt left with all needs in reach and bed alarm on. Throughout session dicussed pt's situation, OT/PT goals, bowel/bladder function, and d/c  planning  Therapy Documentation Precautions:  Precautions Precautions: Fall Restrictions Weight Bearing Restrictions: No Pain:   No/ denies pain  See Function Navigator for Current Functional Status.   Therapy/Group: Individual Therapy  Jerrid Forgette L 01/25/2017, 7:47 AM

## 2017-01-25 NOTE — Significant Event (Signed)
Called to room by PT pt reporting SOB with rest and takes extra effort to breathe deep. VSS. O2 sat 100% on room air. Dan [place] PAC notified of changes and increased effort for deep breathes and more effort required for breathing deep during therapy. New order for CXR for baseline assessment. Margarito Liner

## 2017-01-25 NOTE — Progress Notes (Signed)
Resting in bed, no repeat of symptoms noted with therapy earlier. No distress noted. I+O cath per routine with 800 cc retrun. Tolerated procedure well and did not feel bladder distention or need to urinate prior to procedure

## 2017-01-26 ENCOUNTER — Inpatient Hospital Stay (HOSPITAL_COMMUNITY): Payer: BLUE CROSS/BLUE SHIELD

## 2017-01-26 ENCOUNTER — Inpatient Hospital Stay (HOSPITAL_COMMUNITY): Payer: BLUE CROSS/BLUE SHIELD | Admitting: Occupational Therapy

## 2017-01-26 ENCOUNTER — Encounter (HOSPITAL_COMMUNITY): Payer: BLUE CROSS/BLUE SHIELD | Admitting: Psychology

## 2017-01-26 ENCOUNTER — Inpatient Hospital Stay (HOSPITAL_COMMUNITY): Payer: BLUE CROSS/BLUE SHIELD | Admitting: Physical Therapy

## 2017-01-26 LAB — BASIC METABOLIC PANEL
ANION GAP: 6 (ref 5–15)
BUN: 18 mg/dL (ref 6–20)
CHLORIDE: 101 mmol/L (ref 101–111)
CO2: 26 mmol/L (ref 22–32)
Calcium: 8.5 mg/dL — ABNORMAL LOW (ref 8.9–10.3)
Creatinine, Ser: 0.75 mg/dL (ref 0.61–1.24)
GFR calc Af Amer: >60 mL/min (ref >60)
GLUCOSE: 204 mg/dL — AB (ref 65–99)
POTASSIUM: 3.6 mmol/L (ref 3.5–5.1)
Sodium: 133 mmol/L — ABNORMAL LOW (ref 135–145)

## 2017-01-26 LAB — CBC
HEMATOCRIT: 41.1 % (ref 39.0–52.0)
HEMOGLOBIN: 14.1 g/dL (ref 13.0–17.0)
MCH: 30.4 pg (ref 26.0–34.0)
MCHC: 34.3 g/dL (ref 30.0–36.0)
MCV: 88.6 fL (ref 78.0–100.0)
Platelets: 170 10*3/uL (ref 150–400)
RBC: 4.64 MIL/uL (ref 4.22–5.81)
RDW: 13.2 % (ref 11.5–15.5)
WBC: 2.9 10*3/uL — ABNORMAL LOW (ref 4.0–10.5)

## 2017-01-26 LAB — GLUCOSE, CAPILLARY
GLUCOSE-CAPILLARY: 251 mg/dL — AB (ref 65–99)
GLUCOSE-CAPILLARY: 139 mg/dL — AB (ref 65–99)
GLUCOSE-CAPILLARY: 84 mg/dL (ref 65–99)
Glucose-Capillary: 175 mg/dL — ABNORMAL HIGH (ref 65–99)

## 2017-01-26 NOTE — Progress Notes (Signed)
Subjective: No changes  Exam: Vitals:   01/25/17 1328 01/26/17 0500  BP: 139/71 123/75  Pulse: 82 70  Resp: 16 16  Temp:  (!) 97.4 F (36.3 C)  SpO2: 100% 97%   Gen: In bed, NAD Resp: non-labored breathing, no acute distress Abd: soft, nt  Neuro: MS: Awake, alert,  WK:GSUPJ, EOMI, VFF Motor: He has 3/5 strength at the hips, 4/5 distally. In the UE he has good strength proximally, but 3-4/5 in the hands.  Sensory:decreased to LT to the thighs, to just below the shoulders in UE DTR:3+ and symmetric at the knees, non-sustained clonus on the right.   Impression: 66 yo M with progressive weakness, numbness, urinary retention, hyperreflexia. He had demyelinating changes on EMG, elevated CSF protein, and family felt he had some benefit from initial IVIG which has clouded the picture somewhat. He now has undergone PLEX and repeat IVIG and is worsening.   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 Still unclear how much of his symptoms are due to a superimposed neuropathy, but I would not favor further aggressive immunosuppression when a more likely culprit could be addressed. I do wonder if his CSF protein could be related to the spinal stenosis and the EMG changes to his DM, but this is not and has not been clear.  Recommendations: 1) Appreciate neurosurgery evaluation.  2) continue neurontin.    Roland Rack, MD [facility] [phone]  If 7pm- 7am, please page neurology on call as listed in [place].

## 2017-01-26 NOTE — Progress Notes (Signed)
Physical Therapy Note  Patient Details  Name: RYLAN BERNARD MRN: [id number] Date of Birth: [birth year] Today's Date: 01/26/2017    Time: 1045-1139 54 minutes  1:1 No c/o pain.  Pt performs squat pivot transfers throughout session with supervision, increased time and cues for UE and LE placement.  Kinetron per pt request for LE strengthening 10 x 10 reps bilat.  Partial stands from mat with emphasis on wt shifts and LE mm activation without use of UEs. Pt able to perform partial stands with min/mod A and full standing with max  A without UE support.  W/c mobility throughout unit with supervision on carpet and tile surfaces.  Pt left in bed with needs at hand, blood glucose elevated, RN aware.   Amran Malter 01/26/2017, 11:39 AM

## 2017-01-26 NOTE — Progress Notes (Signed)
Occupational Therapy Session Note  Patient Details  Name: Kenneth Pace MRN: [id number] Date of Birth: 24-Nov-1950  Today's Date: 01/26/2017 OT Individual Time: 0800-0900 OT Individual Time Calculation (min): 60 min    Short Term Goals: Week 2:  OT Short Term Goal 1 (Week 2): Pt will consistently complete stand pivot transfers with min A OT Short Term Goal 2 (Week 2): Pt will manage pants during LB dressing with steayding assist OT Short Term Goal 3 (Week 2): Pt will stand to complete grooming task with steadying assist  Skilled Therapeutic Interventions/Progress Updates:    Pt seen for OT session focusing on functional transfers and mobility. Pt in supine upon arrival, agreeable to tx session. He declined bathing/dressing this session, opting to just change shirt. Completed seated EOB with supervision and set-up. Throughout session pt completing functional transfers alternating btwn sliding board and stand pivot. Both completed with overall min A, however, occasional mod A for sit>stand from w/c. Pt voiced decreased energy consumption when completing stand pivot. In therapy gym, addressed bed mobility and instruction for use of leg lifter with demonstration provided. Upon pt attempt, Pt with full body spasm, sending him into supine on mat and lasting ~10 seconds. From supine position, addressed returning to sitting EOM with max A and VCs for log rolling technique. Discussed recommendation of hospital bed at home for increased independence with mobility and for energy conservation. He completed x7 sit>stand from elevated EOM. Initially requiring max A on first trial, however, quickly progressing to CGA with assist for stabilizing RW due to heavy UE reliance, assist required for controlled descent. He completed x2 min A stand pivot transfers throughout session, requiring increased time to execute LE movements. Pt left in supine at end of session with MD entering room.  Pt required assist  for management of insulin pump due to his poor fine motor control, portable to direct therapist with proper management of device.   Therapy Documentation Precautions:  Precautions Precautions: Fall Restrictions Weight Bearing Restrictions: No Pain:    See Function Navigator for Current Functional Status.   Therapy/Group: Individual Therapy  Alysiah Suppa L 01/26/2017, 12:27 PM

## 2017-01-26 NOTE — Progress Notes (Signed)
Physical Therapy Session Note  Patient Details  Name: Kenneth Pace MRN: [id number] Date of Birth: Mar 30, 1950  Today's Date: 01/26/2017 PT Individual Time: 1345-1430 PT Individual Time Calculation (min): 45 min   Short Term Goals: Week 2:  PT Short Term Goal 1 (Week 2): Pt will consistently perform functional transfers with min A PT Short Term Goal 2 (Week 2): pt will perform sitting balance without UE support with supervision for functional activities  Skilled Therapeutic Interventions/Progress Updates:    Patient in w/c in room, propelled to gym with S.  In parallel bars worked on standing balance with tapping alternate feet to bean bags on floor with UE support.  Patient in static standing without UE support x 15 sec, but c/o back spasm.  In w/c propelled to ortho gym to practice car transfer.  Scoot pivot with mod to max A esp to manage pt's legs which scooted out from under him and to cue to scoot back on seat; assisted legs into and out of car and pivot back to chair with mod A.  Patient able to lean over with proper safety techniques (locking brakes, bracing with hand on armrest,) to retrieve legrest on floor and place on w/c.  Propelled back to room with S and left in w/c with all needs in reach.   Therapy Documentation Precautions:  Precautions Precautions: Fall Restrictions Weight Bearing Restrictions: No Pain: Pain Assessment Faces Pain Scale: Hurts little more Pain Type: Chronic pain Pain Location: Hand Pain Orientation: Left;Right Pain Descriptors / Indicators: Aching Pain Onset: With Activity Pain Intervention(s): Rest   See Function Navigator for Current Functional Status.   Therapy/Group: Individual Therapy  Reginia Naas 01/26/2017, 5:10 PM

## 2017-01-26 NOTE — Progress Notes (Signed)
I spoke with and examined the patient late last week and spoke with Dr. Leonel Ramsay yesterday. Pt has had a slowly progressive what appears to be myelopathy with possible superimposed neuropathy. He does not report any subjective improvement with multiple rounds of IVIG and plasma exchange, confirmed by Dr. Leonel Ramsay. 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 This would likely entail 3 level ACDF at C3-4, C4-5, C5-6. I can get this arranged either later this week or at the latest early next week. I will follow up with the patient tomorrow after scheduling.

## 2017-01-26 NOTE — Progress Notes (Signed)
[facility] PHYSICAL MEDICINE & REHABILITATION     PROGRESS NOTE  Subjective/Complaints:  Pt seen laying in bed this AM.  He states he slept well overnight.  His numbness/strength are about the same. He states his wife has some questions for me and calls her on the phone.   ROS: Denies nausea, vomiting, diarrhea, shortness of breath or chest pain   Objective: Vital Signs: Blood pressure 123/75, pulse 70, temperature (!) 97.4 F (36.3 C), temperature source Oral, resp. rate 16, height 6' (1.829 m), weight 98 kg (216 lb 0.8 oz), SpO2 97 %. Dg Chest 2 View  Result Date: 01/25/2017 CLINICAL DATA:  Shortness of breath and squeezing chest discomfort. EXAM: CHEST  2 VIEW COMPARISON:  Chest x-ray and CT of the chest on 01/01/2017 FINDINGS: The heart size and mediastinal contours are within normal limits. Stable scarring in the lingula. There is no evidence of pulmonary edema, consolidation, pneumothorax, nodule or pleural fluid. The thoracic spine demonstrates degenerative disc disease. IMPRESSION: No active cardiopulmonary disease. Electronically Signed   By: Aletta Edouard M.D.   On: 01/25/2017 15:00   Recent Labs    01/25/17 0530 01/26/17 0536  WBC 3.9* 2.9*  HGB 15.0 14.1  HCT 42.8 41.1  PLT 164 170   Recent Labs    01/25/17 0530 01/26/17 0536  NA 134* 133*  K 4.0 3.6  CL 102 101  GLUCOSE 143* 204*  BUN 17 18  CREATININE 0.79 0.75  CALCIUM 8.7* 8.5*   CBG (last 3)  Recent Labs    01/25/17 1646 01/25/17 2104 01/26/17 0709  GLUCAP 203* 190* 175*    Wt Readings from Last 3 Encounters:  01/26/17 98 kg (216 lb 0.8 oz)  01/10/17 102.4 kg (225 lb 12 oz)  11/27/16 104.3 kg (230 lb)    Physical Exam:  BP 123/75 (BP Location: Left Arm)   Pulse 70   Temp (!) 97.4 F (36.3 C) (Oral)   Resp 16   Ht 6' (1.829 m)   Wt 98 kg (216 lb 0.8 oz)   SpO2 97%   BMI 29.30 kg/m  Constitutional: He appears well-developed and well-nourished.  HENT: Normocephalic and atraumatic.   Eyes: EOM are normal. No discharge. Cardiovascular: RRR. No JVD  Respiratory: CTA Bilaterally. Normal effort    GI: Bowel sounds are normal. He exhibits no distension.   Musculoskeletal: He exhibits no edema or tenderness.  Neurological: He is alert and oriented.  Speech clear.  Cognition intact and able to follow commands without difficulty.  Motor: B/l UE: 4+/5 proximal to distal B/l LE: HF 3/5, KE 4/5, 4+/5 ADF/PF  Sensation to light touch diminished >> distal LE, now involving thorax  Skin: Skin is warm and dry.  Psychiatric: He has a normal mood and affect. His behavior is normal. Judgment and thought content normal.   Assessment/Plan: 1. 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 Physiatrist is providing close team supervision and 24 hour management of active medical problems listed below. Physiatrist and rehab team continue to assess barriers to discharge/monitor patient progress toward functional and medical goals.  Function:  Bathing Bathing position   Position: Shower  Bathing parts Body parts bathed by patient: Right arm, Right upper leg, Left arm, Left upper leg, Abdomen, Chest, Front perineal area, Right lower leg, Left lower leg, Buttocks Body parts bathed by helper: Back  Bathing assist Assist Level: Supervision or verbal cues  Upper Body Dressing/Undressing Upper body dressing   What is the patient wearing?: Pull over shirt/dress     Pull over shirt/dress - Perfomed by patient: Thread/unthread right sleeve, Thread/unthread left sleeve, Put head through opening, Pull shirt over trunk Pull over shirt/dress - Perfomed by helper: Pull shirt over trunk        Upper body assist Assist Level: More than reasonable time   Set up : To obtain clothing/put away  Lower Body Dressing/Undressing Lower body dressing   What is the patient  wearing?: Underwear, Pants, Socks, Non-skid slipper socks Underwear - Performed by patient: Thread/unthread right underwear leg, Thread/unthread left underwear leg Underwear - Performed by helper: Pull underwear up/down Pants- Performed by patient: Thread/unthread right pants leg, Thread/unthread left pants leg Pants- Performed by helper: Pull pants up/down Non-skid slipper socks- Performed by patient: Don/doff right sock, Don/doff left sock(Sock aid)   Socks - Performed by patient: Don/doff right sock, Don/doff left sock(Sock aid) Socks - Performed by helper: Don/doff right sock, Don/doff left sock Shoes - Performed by patient: Don/doff right shoe, Don/doff left shoe Shoes - Performed by helper: Don/doff right shoe, Don/doff left shoe          Lower body assist Assist for lower body dressing: Touching or steadying assistance (Pt > 75%)      Toileting Toileting   Toileting steps completed by patient: Adjust clothing prior to toileting, Performs perineal hygiene Toileting steps completed by helper: Adjust clothing after toileting Toileting Assistive Devices: Grab bar or rail  Toileting assist Assist level: Touching or steadying assistance (Pt.75%)   Transfers Chair/bed transfer   Chair/bed transfer method: Squat pivot Chair/bed transfer assist level: Touching or steadying assistance (Pt > 75%) Chair/bed transfer assistive device: Medical sales representative     Max distance: 10 Assist level: Touching or steadying assistance (Pt > 75%)   Wheelchair   Type: Manual Max wheelchair distance: 80' Assist Level: Supervision or verbal cues  Cognition Comprehension Comprehension assist level: Follows complex conversation/direction with extra time/assistive device  Expression Expression assist level: Expresses complex 90% of the time/cues < 10% of the time  Social Interaction Social Interaction assist level: Interacts appropriately with others - No medications needed.  Problem  Solving Problem solving assist level: Solves complex problems: With extra time  Memory Memory assist level: Recognizes or recalls 90% of the time/requires cueing < 10% of the time    Medical Problem List and Plan: 1.  Weakness, limitations with ADLs secondary to demyelinating myeloneuropathy, completed plasmapheresis and IVIG    Cont CIR   Discussed with Neurology, plan for Neurosurg eval today due to lack of improvement with some regression 2.  DVT Prophylaxis/Anticoagulation: Pharmaceutical: Lovenox 3. Pain Management:    Flexeril PRN for muscle spasms with improvement   Gabapentin increased to 300 TID on 12/3, increased to 600 3 times a day on 12/5 - stable 4. Mood: Remains optimistic. LCSW to follow for evaluation and support.  5. Neuropsych: This patient is capable of making decisions on his own behalf. 6. Skin/Wound Care: Routine pressure relief measures. Maintain adequate nutritional and hydration status.  7. Fluids/Electrolytes/Nutrition: Monitor I/Os. Added nutritional supplement to help promote healing and for BS stabilization.  9.T2DM: Hgb A1c- 7.3 (managed by Dr. Loanne Drilling) Monitor BS ac/hs. Has insulin pump with monitor.      CBG (last 3)  Recent Labs    01/25/17 1646 01/25/17 2104 01/26/17 0709  GLUCAP 203* 190* 175*      Monitor with  increased mobility   Relatively controlled/slightly elevated on 12/11 10. BPH/Neurogenic bladder: On finasteride in am. Added flomax   Now with significant retention 11. HTN: Monitor BP bid. Continue lisinopril Vitals:   01/25/17 1328 01/26/17 0500  BP: 139/71 123/75  Pulse: 82 70  Resp: 16 16  Temp:  (!) 97.4 F (36.3 C)  SpO2: 100% 97%    Relatively controlled on 12/9  12. Anxiety disorder: Due to work stress and has been managed with buspar.  13. Constipation: Resolved with bowel regimen of miralax bid. 14. Hyponatremia   Na 133 on 12/11   Cont to monitor  >35 minutes spent, with >25 minutes with patient and wife in  counseling and coordination of care regarding diagnosis, current plans, and potential future interventions  LOS (Days) 15 A FACE TO FACE EVALUATION WAS PERFORMED  Ankit Lorie Phenix 01/26/2017 8:26 AM

## 2017-01-27 ENCOUNTER — Inpatient Hospital Stay (HOSPITAL_COMMUNITY): Payer: BLUE CROSS/BLUE SHIELD | Admitting: Occupational Therapy

## 2017-01-27 ENCOUNTER — Other Ambulatory Visit: Payer: Self-pay | Admitting: Neurosurgery

## 2017-01-27 ENCOUNTER — Inpatient Hospital Stay (HOSPITAL_COMMUNITY): Payer: BLUE CROSS/BLUE SHIELD | Admitting: Physical Therapy

## 2017-01-27 LAB — GLUCOSE, CAPILLARY
GLUCOSE-CAPILLARY: 260 mg/dL — AB (ref 65–99)
GLUCOSE-CAPILLARY: 311 mg/dL — AB (ref 65–99)
Glucose-Capillary: 141 mg/dL — ABNORMAL HIGH (ref 65–99)
Glucose-Capillary: 163 mg/dL — ABNORMAL HIGH (ref 65–99)

## 2017-01-27 NOTE — Progress Notes (Signed)
Subjective: Patient reports some improvement in strength today but still waxes and wanes. OT present reports his strength his the best it has been since arrival.   Exam: Vitals:   01/26/17 2130 01/27/17 0449  BP: (!) 151/85 129/74  Pulse:  73  Resp:  17  Temp:  97.7 F (36.5 C)  SpO2:  97%   Gen: In wheelchair, NAD Resp: non-labored breathing, no acute distress Abd: soft, nt  Neuro: MS: Awake, alert CN: PEERL, EOMI, VFF Motor: :5/5 strength in bilateral UE proximally, but 4/5 in hands. 3/5 strength in bilateral hips, 4/5 distally.  Sensory: decreased sensation to light touch to the thighs and elbows  DTR: 3+ and symmetric at the knees  Impression:  66 yo M with progressive weakness, numbness, urinary retention, hyperreflexia. He had demyelinating changes on EMG, elevated CSF protein, and family felt he had some benefit from initial IVIG which has clouded the picture somewhat. He now has undergone PLEX and repeat IVIG and is worsening.   Evaluated by neurosurgery who agree that given the lack of response to immunosuppressive therapy his symptoms may be largely related to compressive myelopathy and surgical decompression would be indicated at this time. Plans for surgery in the next week.   Recommendations: 1) Appreciate neurosurgery evaluation 2) Continue gabapentin   Maryellen Pile, MD Internal Medicine Teaching Program PGY-3

## 2017-01-27 NOTE — Progress Notes (Signed)
Pt has home CPAP unit. Pt doesn't need assistance form RT.

## 2017-01-27 NOTE — Progress Notes (Signed)
Physical Therapy Note  Patient Details  Name: Kenneth Pace MRN: [id number] Date of Birth: 1951/01/30 Today's Date: 01/27/2017    Time: 1300-1400 60 minutes  1:1 No c/o pain.  Pt performed stand pivot transfers throughout session with min/mod A with RW.  Pt able to perform gait 10' x 3 with min A with RW.  Sit to stand repetitions without UE assist initially min A, declines to max A when fatigued.  Seated trunk control with 3# wt with reaching tasks with UEs.  W/c mobility with supervision throughout unit.  Pt's wife present for session to observe.   Soo Steelman 01/27/2017, 2:31 PM

## 2017-01-27 NOTE — Progress Notes (Signed)
Occupational Therapy Session Note  Patient Details  Name: Kenneth Pace MRN: [id number] Date of Birth: [birth year]  Today's Date: 01/27/2017 OT Individual Time: 7322-0254 and 1406-1500 OT Individual Time Calculation (min): 75 min and 30 min   Short Term Goals: Week 2:  OT Short Term Goal 1 (Week 2): Pt will consistently complete stand pivot transfers with min A OT Short Term Goal 2 (Week 2): Pt will manage pants during LB dressing with steayding assist OT Short Term Goal 3 (Week 2): Pt will stand to complete grooming task with steadying assist  Skilled Therapeutic Interventions/Progress Updates:    Session One: Pt seen for OT ADL bathing/dressing session. Pt in supine upon arrival, ready for tx session. He transferred to EOB using hospital bed functions with supervision. He desired to try to ambulate into bathroom, mod A to stand and ambulated with min-mod A into bathroom, unable to clear foot entirely when ambulating. He completed toileting task with steadying assist for clothing management, required assist for hygiene following BM. He then transitioned to shower and bathed seated on tub bench, increased time for management of fine motor tasks.  He ambulated out of bathroom to w/c. He dressed seated in w/c, using reacher to assist with threading pants, able to complete much quicker than in previous sessions. He stood at [employer] to pull pants up with steadying assist. He donned one sock with sock aid, therapist assisted for other sock for energy conservation. Completed stand pivot transfer back to bed at end of session, with min A, mod A to return to supine.  Pt left in supine with all needs in reach, bed alarm on.  Pt neuro MD arrived during session discussed plans for surgery at end of week or beginning of next week.   Session Two: Pt seen for OT session focusing on functional mobility and transfers. Pt received sitting up in w/c upon arrival with hand off from PT, voicing increased fatigue,  however, willing to try session though desiring to return to bed.  Taken back to room and completed mod A stand pivot transfer to EOB. Instruction and demonstration provided for use of leg lifter to assist with returning to bed. Due to pt's poor core strength and weak UEs/ grasp, he was unable to maintain balance and manage LE simultaneously. Once R UE placed onto bed, he was able to assist with lifting L LE onto bed. Used hospital bed functions for posterior scoot back into bed. Pt left in supine with all needs in reach and wife present.   Therapy Documentation Precautions:  Precautions Precautions: Fall Restrictions Weight Bearing Restrictions: No Pain:   No/ denies pain  See Function Navigator for Current Functional Status.   Therapy/Group: Individual Therapy  [name] L 01/27/2017, 7:49 AM

## 2017-01-27 NOTE — Plan of Care (Signed)
   Progressing RH SKIN INTEGRITY RH STG SKIN FREE OF INFECTION/BREAKDOWN Description With min. A   01/27/2017 2152 - Progressing by Edd Arbour, RN RH STG MAINTAIN SKIN INTEGRITY WITH ASSISTANCE Description STG Maintain Skin Integrity With Supervision.   01/27/2017 2152 - Progressing by Edd Arbour, RN RH SAFETY RH STG ADHERE TO SAFETY PRECAUTIONS W/ASSISTANCE/DEVICE Description STG Adhere to Safety Precautions With supervision.   01/27/2017 2152 - Progressing by Edd Arbour, RN RH STG DECREASED RISK OF FALL WITH ASSISTANCE Description STG Decreased Risk of Fall With Supervision   01/27/2017 2152 - Progressing by Edd Arbour, RN RH PAIN MANAGEMENT RH STG PAIN MANAGED AT OR BELOW PT'S PAIN GOAL Description Pain less than or equal to 2.   01/27/2017 2152 - Progressing by Edd Arbour, RN

## 2017-01-27 NOTE — Progress Notes (Signed)
[place] PHYSICAL MEDICINE & REHABILITATION     PROGRESS NOTE  Subjective/Complaints:  Patient seen lying in bed this morning. He slept well overnight. His wife has questions for me regarding double crush and future plans and questions that are being relayed from her son.  ROS: Denies nausea, vomiting, diarrhea, shortness of breath or chest pain   Objective: Vital Signs: Blood pressure 129/74, pulse 73, temperature 97.7 F (36.5 C), temperature source Oral, resp. rate 17, height 6' (1.829 m), weight 98.2 kg (216 lb 7.9 oz), SpO2 97 %. Dg Chest 2 View  Result Date: 01/25/2017 CLINICAL DATA:  Shortness of breath and squeezing chest discomfort. EXAM: CHEST  2 VIEW COMPARISON:  Chest x-ray and CT of the chest on 01/01/2017 FINDINGS: The heart size and mediastinal contours are within normal limits. Stable scarring in the lingula. There is no evidence of pulmonary edema, consolidation, pneumothorax, nodule or pleural fluid. The thoracic spine demonstrates degenerative disc disease. IMPRESSION: No active cardiopulmonary disease. Electronically Signed   By: Kenneth Pace M.D.   On: 01/25/2017 15:00   Recent Labs    01/25/17 0530 01/26/17 0536  WBC 3.9* 2.9*  HGB 15.0 14.1  HCT 42.8 41.1  PLT 164 170   Recent Labs    01/25/17 0530 01/26/17 0536  NA 134* 133*  K 4.0 3.6  CL 102 101  GLUCOSE 143* 204*  BUN 17 18  CREATININE 0.79 0.75  CALCIUM 8.7* 8.5*   CBG (last 3)  Recent Labs    01/26/17 1636 01/26/17 2107 01/27/17 0636  GLUCAP 139* 84 260*    Wt Readings from Last 3 Encounters:  01/27/17 98.2 kg (216 lb 7.9 oz)  01/10/17 102.4 kg (225 lb 12 oz)  11/27/16 104.3 kg (230 lb)    Physical Exam:  BP 129/74 (BP Location: Left Arm)   Pulse 73   Temp 97.7 F (36.5 C) (Oral)   Resp 17   Ht 6' (1.829 m)   Wt 98.2 kg (216 lb 7.9 oz)   SpO2 97%   BMI 29.36 kg/m  Constitutional: He appears well-developed and well-nourished.  HENT: Normocephalic and atraumatic.  Eyes:  EOM are normal. No discharge. Cardiovascular: RRR. No JVD  Respiratory: CTA Bilaterally. Normal effort    GI: Bowel sounds are normal. He exhibits no distension.   Musculoskeletal: He exhibits no edema or tenderness.  Neurological: He is alert and oriented.  Speech clear.  Cognition intact and able to follow commands without difficulty.  Motor: B/l UE: 4+/5 proximal to distal B/l LE: HF 3-/5, KE 4-/5, 4+/5 ADF/PF  Sensation to light touch diminished >> distal LE, now involving thorax, continues to ascend proximally Skin: Skin is warm and dry.  Psychiatric: He has a normal mood and affect. His behavior is normal. Judgment and thought content normal.   Assessment/Plan: 1. 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 Physiatrist is providing close team supervision and 24 hour management of active medical problems listed below. Physiatrist and rehab team continue to assess barriers to discharge/monitor patient progress toward functional and medical goals.  Function:  Bathing Bathing position   Position: Shower  Bathing parts Body parts bathed by patient: Right arm, Right upper leg, Left arm, Left upper leg, Abdomen, Chest, Front perineal area, Right lower leg, Left lower leg, Buttocks Body parts bathed by helper: Back  Bathing assist Assist Level: Supervision or verbal cues      Upper  Body Dressing/Undressing Upper body dressing   What is the patient wearing?: Pull over shirt/dress     Pull over shirt/dress - Perfomed by patient: Thread/unthread right sleeve, Thread/unthread left sleeve, Put head through opening, Pull shirt over trunk Pull over shirt/dress - Perfomed by helper: Pull shirt over trunk        Upper body assist Assist Level: More than reasonable time   Set up : To obtain clothing/put away  Lower Body Dressing/Undressing Lower body dressing    What is the patient wearing?: Underwear, Pants, Socks, Non-skid slipper socks Underwear - Performed by patient: Thread/unthread right underwear leg, Thread/unthread left underwear leg Underwear - Performed by helper: Pull underwear up/down Pants- Performed by patient: Thread/unthread right pants leg, Thread/unthread left pants leg Pants- Performed by helper: Pull pants up/down Non-skid slipper socks- Performed by patient: Don/doff right sock, Don/doff left sock(Sock aid)   Socks - Performed by patient: Don/doff right sock, Don/doff left sock(Sock aid) Socks - Performed by helper: Don/doff right sock, Don/doff left sock Shoes - Performed by patient: Don/doff right shoe, Don/doff left shoe Shoes - Performed by helper: Don/doff right shoe, Don/doff left shoe          Lower body assist Assist for lower body dressing: Touching or steadying assistance (Pt > 75%)      Toileting Toileting   Toileting steps completed by patient: Adjust clothing prior to toileting, Performs perineal hygiene Toileting steps completed by helper: Adjust clothing after toileting Toileting Assistive Devices: Grab bar or rail  Toileting assist Assist level: Touching or steadying assistance (Pt.75%)   Transfers Chair/bed transfer   Chair/bed transfer method: Squat pivot Chair/bed transfer assist level: Touching or steadying assistance (Pt > 75%) Chair/bed transfer assistive device: Medical sales representative     Max distance: 10 Assist level: Touching or steadying assistance (Pt > 75%)   Wheelchair   Type: Manual Max wheelchair distance: 120 Assist Level: Supervision or verbal cues  Cognition Comprehension Comprehension assist level: Follows complex conversation/direction with extra time/assistive device  Expression Expression assist level: Expresses complex 90% of the time/cues < 10% of the time  Social Interaction Social Interaction assist level: Interacts appropriately with others - No  medications needed.  Problem Solving Problem solving assist level: Solves complex problems: With extra time  Memory Memory assist level: Recognizes or recalls 90% of the time/requires cueing < 10% of the time    Medical Problem List and Plan: 1.  Weakness, limitations with ADLs secondary to demyelinating myeloneuropathy, completed plasmapheresis and IVIG    Cont CIR   Discussed with Neurology, neurosurgery to discuss plans for surgery with patient today 2.  DVT Prophylaxis/Anticoagulation: Pharmaceutical: Lovenox 3. Pain Management:    Flexeril PRN for muscle spasms with improvement   Gabapentin increased to 300 TID on 12/3, increased to 600 3 times a day on 12/5 - stable 4. Mood: Remains optimistic. LCSW to follow for evaluation and support.  5. Neuropsych: This patient is capable of making decisions on his own behalf. 6. Skin/Wound Care: Routine pressure relief measures. Maintain adequate nutritional and hydration status.  7. Fluids/Electrolytes/Nutrition: Monitor I/Os. Added nutritional supplement to help promote healing and for BS stabilization.  9.T2DM: Hgb A1c- 7.3 (managed by Dr. Loanne Drilling) Monitor BS ac/hs. Has insulin pump with monitor.      CBG (last 3)  Recent Labs    01/26/17 1636 01/26/17 2107 01/27/17 0636  GLUCAP 139* 84 260*      Monitor with increased mobility   Labile  on 12/12 10. BPH/Neurogenic bladder: On finasteride in am. Added flomax   Now with significant retention 11. HTN: Monitor BP bid. Continue lisinopril Vitals:   01/26/17 2130 01/27/17 0449  BP: (!) 151/85 129/74  Pulse:  73  Resp:  17  Temp:  97.7 F (36.5 C)  SpO2:  97%    Relatively controlled on 12/12 12. Anxiety disorder: Due to work stress and has been managed with buspar.  13. Constipation: Resolved with bowel regimen of miralax bid. 14. Hyponatremia   Na 133 on 12/11   Cont to monitor  >35 minutes spent, with >25 minutes with patient and wife in counseling regarding diagnosis, current  plans, and potential future interventions, previous course, double crush injury  LOS (Days) 16 A FACE TO FACE EVALUATION WAS PERFORMED  Kenneth Pace Kenneth Pace 01/27/2017 9:01 AM

## 2017-01-28 ENCOUNTER — Inpatient Hospital Stay (HOSPITAL_COMMUNITY): Payer: BLUE CROSS/BLUE SHIELD | Admitting: Occupational Therapy

## 2017-01-28 ENCOUNTER — Inpatient Hospital Stay (HOSPITAL_COMMUNITY): Payer: BLUE CROSS/BLUE SHIELD

## 2017-01-28 LAB — GLUCOSE, CAPILLARY
GLUCOSE-CAPILLARY: 204 mg/dL — AB (ref 65–99)
Glucose-Capillary: 53 mg/dL — ABNORMAL LOW (ref 65–99)
Glucose-Capillary: 223 mg/dL — ABNORMAL HIGH (ref 65–99)
Glucose-Capillary: 180 mg/dL — ABNORMAL HIGH (ref 65–99)

## 2017-01-28 NOTE — Patient Care Conference (Signed)
Inpatient RehabilitationTeam Conference and Plan of Care Update Date: 01/27/2017   Time: 2:30 PM    Patient Name: Kenneth Pace      Medical Record Number: [id number]  Date of Birth: [birth year] Sex: Male         Room/Bed: 4W05C/4W05C-01 Payor Info: Payor: [place] / Plan: [employer] OTHER / Product Type: *No Product type* /    Admitting Diagnosis: sob  Admit Date/Time:  01/11/2017  5:22 PM Admission Comments: No comment available   Primary Diagnosis:  <principal problem not specified> Principal Problem: <principal problem not specified>  Patient Active Problem List   Diagnosis Date Noted  . Spondylosis, cervical, with myelopathy   . Neuropathic pain   . Benign essential HTN   . Labile blood glucose   . Muscle spasm   . Type 2 diabetes mellitus with peripheral neuropathy (HCC)   . Generalized anxiety disorder   . Acute inflammatory demyelinating polyneuropathy ([place]) 01/11/2017  . Anxiety state   . Neurogenic bladder   . Depression 12/30/2016  . Leg weakness, bilateral 12/30/2016  . 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 GBS (Guillain Barre syndrome) ([place])   . AIDP (acute inflammatory demyelinating polyneuropathy) ([place]) 11/27/2016  . Weakness 11/20/2016  . Weakness of both arms 10/30/2016  . Numbness 03/18/2016  . Diabetes mellitus without complication ([place]) 95/10/3265  . Eustachian tube dysfunction 02/12/2015  . Acute maxillary sinusitis 02/12/2015  . Wellness examination 08/22/2014  . Alkaline phosphatase elevation 08/22/2014  . Screening for prostate cancer 04/24/2013  . Routine general medical examination at a health care facility 04/10/2012  . BPH (benign prostatic hyperplasia) 02/03/2010  . HEMOCCULT POSITIVE STOOL 05/25/2008  . ELBOW PAIN, RIGHT 07/04/2007  . Dyslipidemia 02/16/2007  . ANXIETY 02/16/2007  . ERECTILE DYSFUNCTION 02/16/2007  . Essential hypertension 02/16/2007  . TESTICULAR MASS, LEFT 02/16/2007   . KNEE PAIN, CHRONIC 02/16/2007  . ADENOIDECTOMY, HX OF 02/16/2007    Expected Discharge Date: Expected Discharge Date: (TBD)  Team Members Present: Physician leading conference: Dr. Delice Lesch Social Worker Present: Lennart Pall, LCSW Nurse Present: Dorien Chihuahua, RN PT Present: Roderic Ovens, PT OT Present: Willeen Cass, OT SLP Present: Windell Moulding, SLP PPS Coordinator present : Daiva Nakayama, RN, CRRN     Current Status/Progress Goal Weekly Team Focus  Medical   Weakness, limitations with ADLs secondary to demyelinating myeloneuropathy and cervical myelopathy  Improve mobility, weakness/numbness, DM  See above   Bowel/Bladder   Continent of B/B, In and Out cath for no void q8hrs. LBM 12/08, Given miralax, MOM and prunne juice at bedtime, no results yet  Maintain continent of B/B and regular bowel movement  Assess the need for B/B q shift   Swallow/Nutrition/ Hydration             ADL's   Min-mod A LB bathing/dressind depending on fatigue; min-mod A functional transfers using sliding board when fatigued or stand pivot with RW  Remain set at mod I level, however, pt with potential surgery upcoming  ADL re-training, functional standing balance/ endurance, activity tolerance, neuro re-ed   Mobility   fluctuates supervision-mod A squat pivot transfers, supervision w/c  mod I w/c level  NMR, strength, activity tolerance   Communication             Safety/Cognition/ Behavioral Observations  Free from injury  To remain injury free with transfers  Clutter free room to prevent injury   Pain   Pt has scheduled neurontin 600 mg X3 daily.  Has flexiril order 10 mg X3 daily PRN. He has not needed it so far  Pain control of 3/10 or less      Skin   Insulin pump to the left and right upper abdomen  Prevent skin breakdown and infection  Assess and monitor skin every shift    Rehab Goals Patient on target to meet rehab goals: No Rehab Goals Revised: Plan now for neurosurgery next  week *See Care Plan and progress notes for long and short-term goals.     Barriers to Discharge  Current Status/Progress Possible Resolutions Date Resolved   Physician    Medical stability;Other (comments)  Urinary incontinence  See above  Therapies, insulin pump being managed by pt, neurosurg eval for likely surgery      Nursing                  PT                    OT                  SLP                SW                Discharge Planning/Teaching Needs:  Plan had been to d/c home with wife who can provide 24/7 supervision, however, now planning for neurosurgery next week.  Teaching needs TBD   Team Discussion:  MD reports now that neurosurgery planned for next week. Still requiring caths and continue to work on bowels.  Today with PT was much better and actually ambulated some.  Team feels he should return to CIR following surgery.  Revisions to Treatment Plan:  Plan for neurosurgery    Continued Need for Acute Rehabilitation Level of Care: The patient requires daily medical management by a physician with specialized training in physical medicine and rehabilitation for the following conditions: Daily direction of a multidisciplinary physical rehabilitation program to ensure safe treatment while eliciting the highest outcome that is of practical value to the patient.: Yes Daily medical management of patient stability for increased activity during participation in an intensive rehabilitation regime.: Yes Daily analysis of laboratory values and/or radiology reports with any subsequent need for medication adjustment of medical intervention for : Neurological problems;Diabetes problems;Blood pressure problems;Other  Ikhlas Albo, [place] 01/28/2017, 12:13 PM

## 2017-01-28 NOTE — Progress Notes (Signed)
[place] PHYSICAL MEDICINE & REHABILITATION     PROGRESS NOTE  Subjective/Complaints:  Patient seen sitting up in bed this morning. He states he slept well overnight. He states he is about the same as yesterday.  ROS: Denies nausea, vomiting, diarrhea, shortness of breath or chest pain   Objective: Vital Signs: Blood pressure 127/71, pulse 70, temperature (!) 97.5 F (36.4 C), temperature source Oral, resp. rate 16, height 6' (1.829 m), weight 98.4 kg (216 lb 14.9 oz), SpO2 95 %. No results found. Recent Labs    01/26/17 0536  WBC 2.9*  HGB 14.1  HCT 41.1  PLT 170   Recent Labs    01/26/17 0536  NA 133*  K 3.6  CL 101  GLUCOSE 204*  BUN 18  CREATININE 0.75  CALCIUM 8.5*   CBG (last 3)  Recent Labs    01/27/17 1634 01/27/17 2104 01/28/17 0655  GLUCAP 141* 163* 53*    Wt Readings from Last 3 Encounters:  01/28/17 98.4 kg (216 lb 14.9 oz)  01/10/17 102.4 kg (225 lb 12 oz)  11/27/16 104.3 kg (230 lb)    Physical Exam:  BP 127/71 (BP Location: Left Arm)   Pulse 70   Temp (!) 97.5 F (36.4 C) (Oral)   Resp 16   Ht 6' (1.829 m)   Wt 98.4 kg (216 lb 14.9 oz)   SpO2 95%   BMI 29.42 kg/m  Constitutional: He appears well-developed and well-nourished.  HENT: Normocephalic and atraumatic.  Eyes: EOM are normal. No discharge. Cardiovascular: RRR. No JVD  Respiratory: CTA Bilaterally. Normal effort    GI: Bowel sounds are normal. He exhibits no distension.   Musculoskeletal: He exhibits no edema or tenderness.  Neurological: He is alert and oriented.  Speech clear.  Cognition intact and able to follow commands without difficulty.  Motor: B/l UE: 4+/5 proximal to distal B/l LE: HF 3-/5, KE 4-/5, 4+/5 ADF/PF  Sensation to light touch diminished >> distal LE, now involving thorax, stable today Skin: Skin is warm and dry.  Psychiatric: He has a normal mood and affect. His behavior is normal. Judgment and thought content normal.   Assessment/Plan: 1.  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 Physiatrist is providing close team supervision and 24 hour management of active medical problems listed below. Physiatrist and rehab team continue to assess barriers to discharge/monitor patient progress toward functional and medical goals.  Function:  Bathing Bathing position   Position: Shower  Bathing parts Body parts bathed by patient: Right arm, Right upper leg, Left arm, Left upper leg, Abdomen, Chest, Front perineal area Body parts bathed by helper: Right lower leg, Left lower leg, Back, Buttocks  Bathing assist Assist Level: Supervision or verbal cues      Upper Body Dressing/Undressing Upper body dressing   What is the patient wearing?: Pull over shirt/dress     Pull over shirt/dress - Perfomed by patient: Thread/unthread right sleeve, Thread/unthread left sleeve, Put head through opening, Pull shirt over trunk Pull over shirt/dress - Perfomed by helper: Pull shirt over trunk        Upper body assist Assist Level: More than reasonable time   Set up : To obtain clothing/put away  Lower Body Dressing/Undressing Lower body dressing   What is the patient wearing?: Pants, Socks Underwear - Performed by patient: Thread/unthread right underwear leg, Thread/unthread left underwear leg Underwear - Performed by helper: Pull underwear  up/down Pants- Performed by patient: Thread/unthread right pants leg, Thread/unthread left pants leg, Pull pants up/down Pants- Performed by helper: Pull pants up/down Non-skid slipper socks- Performed by patient: Don/doff right sock, Don/doff left sock(Sock aid)   Socks - Performed by patient: Don/doff right sock, Don/doff left sock(sock aid) Socks - Performed by helper: Don/doff right sock, Don/doff left sock Shoes - Performed by patient: Don/doff right shoe, Don/doff left  shoe Shoes - Performed by helper: Don/doff right shoe, Don/doff left shoe          Lower body assist Assist for lower body dressing: Touching or steadying assistance (Pt > 75%)      Toileting Toileting   Toileting steps completed by patient: Adjust clothing prior to toileting, Adjust clothing after toileting Toileting steps completed by helper: Performs perineal hygiene Toileting Assistive Devices: Grab bar or rail  Toileting assist Assist level: Touching or steadying assistance (Pt.75%)   Transfers Chair/bed transfer   Chair/bed transfer method: Squat pivot Chair/bed transfer assist level: Touching or steadying assistance (Pt > 75%) Chair/bed transfer assistive device: Medical sales representative     Max distance: 10 Assist level: Touching or steadying assistance (Pt > 75%)   Wheelchair   Type: Manual Max wheelchair distance: 120 Assist Level: Supervision or verbal cues  Cognition Comprehension Comprehension assist level: Follows complex conversation/direction with extra time/assistive device  Expression Expression assist level: Expresses complex 90% of the time/cues < 10% of the time  Social Interaction Social Interaction assist level: Interacts appropriately with others - No medications needed.  Problem Solving Problem solving assist level: Solves complex problems: With extra time  Memory Memory assist level: Recognizes or recalls 90% of the time/requires cueing < 10% of the time    Medical Problem List and Plan: 1.  Weakness, limitations with ADLs secondary to demyelinating myeloneuropathy, completed plasmapheresis and IVIG    Cont CIR   Discussed with Neurology, per notes, surgery planned for Monday 2.  DVT Prophylaxis/Anticoagulation: Pharmaceutical: Lovenox 3. Pain Management:    Flexeril PRN for muscle spasms with improvement   Gabapentin increased to 300 TID on 12/3, increased to 600 3 times a day on 12/5 - stable 4. Mood: Remains optimistic. LCSW to  follow for evaluation and support.  5. Neuropsych: This patient is capable of making decisions on his own behalf. 6. Skin/Wound Care: Routine pressure relief measures. Maintain adequate nutritional and hydration status.  7. Fluids/Electrolytes/Nutrition: Monitor I/Os. Added nutritional supplement to help promote healing and for BS stabilization.  9.T2DM: Hgb A1c- 7.3 (managed by Dr. Loanne Drilling) Monitor BS ac/hs. Has insulin pump with monitor.      CBG (last 3)  Recent Labs    01/27/17 1634 01/27/17 2104 01/28/17 0655  GLUCAP 141* 163* 53*      Monitor with increased mobility   Labile on 12/13 10. BPH/Neurogenic bladder: On finasteride in am. Added flomax   Now with significant retention 11. HTN: Monitor BP bid. Continue lisinopril Vitals:   01/27/17 1500 01/28/17 0500  BP: 120/77 127/71  Pulse: 94 70  Resp: 17 16  Temp: 97.6 F (36.4 C) (!) 97.5 F (36.4 C)  SpO2: 100% 95%    Controlled on 12/13 12. Anxiety disorder: Due to work stress and has been managed with buspar.  13. Constipation: Resolved with bowel regimen of miralax bid. 14. Hyponatremia   Na 133 on 12/11   Cont to monitor  LOS (Days) 17 A FACE TO FACE EVALUATION WAS PERFORMED  Kenneth Pace [place] 01/28/2017  9:18 AM

## 2017-01-28 NOTE — Progress Notes (Signed)
RT went to place Pt on CPAP.  PT has home CPAP RT asked if pt needed anything pt stated no.  CPAP within reach.

## 2017-01-28 NOTE — Progress Notes (Signed)
Physical Therapy Note  Patient Details  Name: Kenneth Pace MRN: [id number] Date of Birth: [birth year] Today's Date: 01/28/2017  tx 1:  1120-1210, 50 min individual tx Pain: none per pt  Bed mobility with HOB raised, supervision.  Pt donned shoes without socks using long handled shoe horn. Stand pivot bed > w/c to R using RW from raised bed, min assist.  neuromuscular re-education via forced use, multimodal cues for bilateral alternating movement in sitting in w/c using Kinetron at level 70 x 25 cycles x 4 with bil UE support.   Sit> stand in parallel bars; in standing with heavy reliance on bil UEs, wt shifting L><R with cues to relax knee of non wt bearing LE.  Gait training x 4' forward and backward with mod assist to advance R.  Pt left resting in bed with Leeann, NT getting ready to cath pt.  Pt reported that he will be having cervical spine neurosurgery Monday 12/17.   tx 2: 1300-1330, 30 min individual tx Pain: none per pt  tx focused on pt donning Santa suit over clothes in bed and sitting EOB and in w/c.   Pt donned Santa pants over his pants in supine, with mod assist with pt rolling L><R.  Supine> sit with supervision and bed features.  Pt doffed his long sleeved shirt, donned short sleeve shirt with supervision and then Santa tunic and shoes with shoe horn with min assist, sitting EOB. Stand pivot with RW from raised bed to w/c.  W/c propulsion over level tile x 50' with supervision. Pt left in care of Lattie Haw, [facility] to go to [employer].  See function navigator for current status.  Jolea Dolle 01/28/2017, 11:49 AM

## 2017-01-28 NOTE — Progress Notes (Signed)
No issues overnight. Pt has developed urinary retention, and is currently on an ISC regimen. Cont to have BLE > BLE weakness. Unable to walk.  EXAM:  BP 113/60 (BP Location: Left Arm)   Pulse 80   Temp 98.6 F (37 C) (Oral)   Resp 18   Ht 6' (1.829 m)   Wt 98.4 kg (216 lb 14.9 oz)   SpO2 98%   BMI 29.42 kg/m   Awake, alert, oriented  Speech fluent, appropriate  CN grossly intact  4+/5 BUE 3/5 proximal BLE, 4/5 distal BLE   IMPRESSION:  66 y.o. male with complicated history of myelopathy and possible superimposed polyneuropathy. Unfortunately he continues to decline from a neurologic standpoint despite maximal immunosuppressive therapy. He does have cervical stenosis on MRI which I do believe requires treatment at this point as he continues to decline.   PLAN: - Will proceed with C3-4, C4-5, C5-6 ACDF on Monday at [facility] after midnight Sunday  I spoke with the patient and his wife on speakerphone regarding the current situation and my recommendation for cervical decompression. We extensively reviewed the details of surgery, expected postoperative course, and risks of the surgery. Specifically, I did discuss the risk of spinal cord/nerve injury leading to worsening numbness/tingling/weakness or even paralysis. In addition, risk of bleeding, infection, spinal fluid leak, dysphagia, dysphonia were also discussed. We also spoke about the possibility of persistence of weakness after surgery and the possible need for additional surgeries in the future. Patient and his wife appeared to understand our discussion. All questions were answered and they provided verbal consent to proceed.

## 2017-01-28 NOTE — Progress Notes (Signed)
Occupational Therapy Weekly Progress Note  Patient Details  Name: Kenneth Pace MRN: [id number] Date of Birth: 1951-01-11  Beginning of progress report period: January 19, 2017 End of progress report period: January 28, 2017  Today's Date: 01/28/2017 OT Individual Time: 5790-3833 OT Individual Time Calculation (min): 75 min    Patient has met 2 of 3 short term goals.  Pt is making progress towards OT goals. He is demonstrating improvement in LE strength and activity tolerance this reporting period compared to last, increasing his independence with functional ambulation and ADL tasks. He can completed sliding board transfers supervision-min A, however, have been working more on stand pivot transfers as pt able, completing min-mod A to stand depending on faitgue level. He cont to have heavy reliance on UEs during standing task, however, is now able to stand short periods of time without any UE support which is an improvement from last week. He cont to be limited by poor fine motor abilities and decreased functional activity tolerance.  Plan is for pt to have ACDF next week, hopes to return to IPR following surgery and cont to work to increase independence with ADLs.   Patient continues to demonstrate the following deficits: ataxia, muscle weakness (generalized) and quadriparesis  and therefore will continue to benefit from skilled OT intervention to enhance overall performance with BADL and Reduce care partner burden.  Patient progressing toward long term goals..  Continue plan of care.  OT Short Term Goals Week 2:  OT Short Term Goal 1 (Week 2): Pt will consistently complete stand pivot transfers with min A OT Short Term Goal 1 - Progress (Week 2): Progressing toward goal OT Short Term Goal 2 (Week 2): Pt will manage pants during LB dressing with steayding assist OT Short Term Goal 2 - Progress (Week 2): Met OT Short Term Goal 3 (Week 2): Pt will stand to complete grooming task with  steadying assist OT Short Term Goal 3 - Progress (Week 2): Met Week 3:  OT Short Term Goal 1 (Week 3): Cont to work towards LTG, awaiting surgery next week  Skilled Therapeutic Interventions/Progress Updates:    Pt seen for OT session focusing on ADL re-training and fine motor control. Pt in supine upon arrival, agreeable to tx session. He denied bathing/dressing this session. He transferred with supervision using hospital bed functions to EOB. Ambulated with RW and min A to sink, very little if any foot clearance during ambulation. HE stood at sink with CGA to complete grooming task, maintaining balance with one UE support while he brushed teeth and washed face, tolerating ~2-3 minutes of standing.  In therapy gym, completed fine motor activities from seated position. Pt able to manipulate scissors with R UE in order to cut foam blocks. He then practiced pincer grasp picking up and placing foam blocks into cup, completed bilaterally with increased time. Completed in hand manipulation activities and various grasps. He returned to room at end of session, stand pivot to bed and mod A to return to supine. Pt left in supine with all needs in reach.   Therapy Documentation Precautions:  Precautions Precautions: Fall Restrictions Weight Bearing Restrictions: No Pain:   No/ denies pain  See Function Navigator for Current Functional Status.   Therapy/Group: Individual Therapy  Kenneth Pace L 01/28/2017, 7:10 AM

## 2017-01-28 NOTE — Progress Notes (Signed)
Social Work Patient ID: Kenneth Pace, male   DOB: 27-Sep-1950, 66 y.o.   MRN: [id number]   Have reviewed team conference with pt and wife.  Both aware plan is now for neurosurgery on Monday.  Both very hopeful he will be able to return to CIR following surgery.  Conitnue to follow.  Taimur Fier, LCSW

## 2017-01-29 ENCOUNTER — Inpatient Hospital Stay (HOSPITAL_COMMUNITY): Payer: BLUE CROSS/BLUE SHIELD | Admitting: Physical Therapy

## 2017-01-29 ENCOUNTER — Inpatient Hospital Stay (HOSPITAL_COMMUNITY): Payer: BLUE CROSS/BLUE SHIELD | Admitting: Occupational Therapy

## 2017-01-29 LAB — GLUCOSE, CAPILLARY
GLUCOSE-CAPILLARY: 135 mg/dL — AB (ref 65–99)
Glucose-Capillary: 84 mg/dL (ref 65–99)
Glucose-Capillary: 249 mg/dL — ABNORMAL HIGH (ref 65–99)

## 2017-01-29 NOTE — Progress Notes (Signed)
Subjective: No changes  Exam: Vitals:   01/29/17 0518 01/29/17 1500  BP: 137/72 (!) 143/80  Pulse: 64 85  Resp: 18 18  Temp: 97.7 F (36.5 C) 98.7 F (37.1 C)  SpO2: 97% 98%   Gen: In bed, NAD Resp: non-labored breathing, no acute distress Abd: soft, nt  Neuro: MS: Awake, alert,  PI:RJJOA, EOMI, VFF Motor: He has 3/5 strength at the hips, 4/5 distally. In the UE he has good strength proximally, but 3-4/5 in the hands.  Sensory:decreased to LT to the thighs, to just below the shoulders in UE DTR:3+ and symmetric at the knees, non-sustained clonus bilaterally   Impression: 66 yo M with progressive weakness, numbness, urinary retention, hyperreflexia. He had demyelinating changes on EMG, elevated CSF protein, and family felt he had some benefit from initial IVIG which has clouded the picture somewhat. He now has undergone PLEX and repeat IVIG and is worsening.   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 Still unclear how much of his symptoms are due to a superimposed neuropathy, but I would not favor further aggressive immunosuppression when a more likely culprit could be addressed. I do wonder if his CSF protein could be related to the spinal stenosis and the EMG changes to his DM, but this is not and has not been clear.  Of note, paraneoplastic eval to [facility] was negative.   Recommendations: 1) Appreciate neurosurgery. 2) continue neurontin.  3) Will check back after surgery  Roland Rack, MD [facility] [phone]  If 7pm- 7am, please page neurology on call as listed in [facility].

## 2017-01-29 NOTE — Progress Notes (Addendum)
Physical Therapy Note  Patient Details  Name: Kenneth Pace MRN: [id number] Date of Birth: Feb 02, 1951 Today's Date: 01/29/2017    Time: 900-940 40 minutes  1:1 Pt c/o pain in Lt shoulder, hot pack applied during session.  Session focused on supine NMR for LE and core strengthening with PNFs, LTR, bridging, heel slides, hip abd/add, hip IR/ER all with AAROM.  Pt performs transfers consistently throughout session with min A.  Left in bed with needs at hand, RN present.   Time: 1130-1200 30 minutes  1:1 No c/o pain.  Transfers with supervision, squat pivot.  Sit to stand and standing tolerance in parallel bars with min A to stand with light UE use.  Pt improving LE strength and able to stand a total of 10 minutes throughout session.    Carlei Huang 01/29/2017, 9:41 AM

## 2017-01-29 NOTE — Progress Notes (Signed)
Kenneth Pace PHYSICAL MEDICINE & REHABILITATION     PROGRESS NOTE  Subjective/Complaints:  Pt seen laying in bed this AM.  He slept well overnight.  He states he spoke to Neurosurg yesterday and plan is for Surgery on Monday. He states he is overall the same as yesterday.   ROS: Denies nausea, vomiting, diarrhea, shortness of breath or chest pain   Objective: Vital Signs: Blood pressure 137/72, pulse 64, temperature 97.7 F (36.5 C), temperature source Oral, resp. rate 18, height 6' (1.829 m), weight 98.4 kg (216 lb 14.9 oz), SpO2 97 %. No results found. No results for input(s): WBC, HGB, HCT, PLT in the last 72 hours. No results for input(s): NA, K, CL, GLUCOSE, BUN, CREATININE, CALCIUM in the last 72 hours.  Invalid input(s): CO CBG (last 3)  Recent Labs    01/28/17 1655 01/28/17 2122 01/29/17 0650  GLUCAP 204* 180* 84    Wt Readings from Last 3 Encounters:  01/28/17 98.4 kg (216 lb 14.9 oz)  01/10/17 102.4 kg (225 lb 12 oz)  11/27/16 104.3 kg (230 lb)    Physical Exam:  BP 137/72 (BP Location: Left Arm)   Pulse 64   Temp 97.7 F (36.5 C) (Oral)   Resp 18   Ht 6' (1.829 m)   Wt 98.4 kg (216 lb 14.9 oz)   SpO2 97%   BMI 29.42 kg/m  Constitutional: He appears well-developed and well-nourished.  HENT: Normocephalic and atraumatic.  Eyes: EOM are normal. No discharge. Cardiovascular: RRR. No JVD  Respiratory: CTA Bilaterally. Normal effort    GI: Bowel sounds are normal. He exhibits no distension.   Musculoskeletal: He exhibits no edema or tenderness.  Neurological: He is alert and oriented.  Speech clear.  Cognition intact and able to follow commands without difficulty.  Motor: B/l UE: 4+/5 proximal to distal (stable) B/l LE: HF 3-/5, KE 4-/5, 4+/5 ADF/PF  Sensation to light touch diminished >> distal LE, now involving thorax, stable Skin: Skin is warm and dry.  Psychiatric: He has a normal mood and affect. His behavior is normal. Judgment and thought content  normal.   Assessment/Plan: 1. 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 Physiatrist is providing close team supervision and 24 hour management of active medical problems listed below. Physiatrist and rehab team continue to assess barriers to discharge/monitor patient progress toward functional and medical goals.  Function:  Bathing Bathing position   Position: Shower  Bathing parts Body parts bathed by patient: Right arm, Right upper leg, Left arm, Left upper leg, Abdomen, Chest, Front perineal area, Buttocks Body parts bathed by helper: Right lower leg, Left lower leg, Back  Bathing assist Assist Level: Touching or steadying assistance(Pt > 75%)      Upper Body Dressing/Undressing Upper body dressing   What is the patient wearing?: Pull over shirt/dress     Pull over shirt/dress - Perfomed by patient: Thread/unthread right sleeve, Thread/unthread left sleeve, Put head through opening, Pull shirt over trunk Pull over shirt/dress - Perfomed by helper: Pull shirt over trunk        Upper body assist Assist Level: More than reasonable time   Set up : To obtain clothing/put away  Lower Body Dressing/Undressing Lower body dressing   What is the patient wearing?: Pants, Socks, Shoes Underwear - Performed by patient: Thread/unthread right underwear leg, Thread/unthread left underwear leg, Pull underwear up/down Underwear - Performed by helper: Pull underwear  up/down Pants- Performed by patient: Thread/unthread right pants leg, Thread/unthread left pants leg, Pull pants up/down Pants- Performed by helper: Pull pants up/down Non-skid slipper socks- Performed by patient: Don/doff right sock, Don/doff left sock(Sock aid)   Socks - Performed by patient: Don/doff right sock, Don/doff left sock(sock aid) Socks - Performed by helper: Don/doff right sock, Don/doff left  sock Shoes - Performed by patient: Don/doff right shoe, Don/doff left shoe Shoes - Performed by helper: Don/doff right shoe, Don/doff left shoe          Lower body assist Assist for lower body dressing: Touching or steadying assistance (Pt > 75%)      Toileting Toileting   Toileting steps completed by patient: Adjust clothing prior to toileting, Adjust clothing after toileting Toileting steps completed by helper: Performs perineal hygiene Toileting Assistive Devices: Grab bar or rail  Toileting assist Assist level: Touching or steadying assistance (Pt.75%)   Transfers Chair/bed transfer   Chair/bed transfer method: Stand pivot Chair/bed transfer assist level: Touching or steadying assistance (Pt > 75%) Chair/bed transfer assistive device: Medical sales representative     Max distance: 4 Assist level: Moderate assist (Pt 50 - 74%)   Wheelchair   Type: Manual Max wheelchair distance: 120 Assist Level: Supervision or verbal cues  Cognition Comprehension Comprehension assist level: Follows complex conversation/direction with extra time/assistive device  Expression Expression assist level: Expresses complex 90% of the time/cues < 10% of the time  Social Interaction Social Interaction assist level: Interacts appropriately with others - No medications needed.  Problem Solving Problem solving assist level: Solves complex problems: With extra time  Memory Memory assist level: Recognizes or recalls 90% of the time/requires cueing < 10% of the time    Medical Problem List and Plan: 1.  Weakness, limitations with ADLs secondary to demyelinating myeloneuropathy, completed plasmapheresis and IVIG    Cont CIR   Plan for cervical decompression on Monday. 2.  DVT Prophylaxis/Anticoagulation: Pharmaceutical: Lovenox 3. Pain Management:    Flexeril PRN for muscle spasms with improvement   Gabapentin increased to 300 TID on 12/3, increased to 600 3 times a day on 12/5 - stable 4.  Mood: Remains optimistic. LCSW to follow for evaluation and support.  5. Neuropsych: This patient is capable of making decisions on his own behalf. 6. Skin/Wound Care: Routine pressure relief measures. Maintain adequate nutritional and hydration status.  7. Fluids/Electrolytes/Nutrition: Monitor I/Os. Added nutritional supplement to help promote healing and for BS stabilization.  9.T2DM: Hgb A1c- 7.3 (managed by Dr. Loanne Drilling) Monitor BS ac/hs. Has insulin pump with monitor.      CBG (last 3)  Recent Labs    01/28/17 1655 01/28/17 2122 01/29/17 0650  GLUCAP 204* 180* 84      Monitor with increased mobility   Labile, but overall improving on 12/14 10. BPH/Neurogenic bladder: On finasteride in am. Added flomax   Now with retention 11. HTN: Monitor BP bid. Continue lisinopril Vitals:   01/28/17 2120 01/29/17 0518  BP: (!) 142/77 137/72  Pulse:  64  Resp:  18  Temp:  97.7 F (36.5 C)  SpO2:  97%    Relatively controlled on 12/14 12. Anxiety disorder: Due to work stress and has been managed with buspar.  13. Constipation: Resolved with bowel regimen of miralax bid. 14. Hyponatremia   Na 133 on 12/11   Labs ordered for Monday   Cont to monitor  LOS (Days) 18 A FACE TO FACE EVALUATION WAS PERFORMED  Kenneth Pace  Kenneth Pace 01/29/2017 9:40 AM

## 2017-01-29 NOTE — Progress Notes (Signed)
Physical Therapy Weekly Progress Note  Patient Details  Name: Kenneth Pace MRN: [id number] Date of Birth: Apr 30, 1950  Beginning of progress report period: January 19, 2017 End of progress report period: January 29, 2017  Patient has met 1 of 2 short term goals.  Pt has made slow progress with transfers and strength.  Pt improving sitting balance and standing tolerance, continues to require occasional mod or max  A with transfers when fatigued.  Plan is for pt to go to neurosurgery on Monday for ACDF of C3-C6.  Patient continues to demonstrate the following deficits muscle weakness, unbalanced muscle activation and decreased coordination and decreased sitting balance, decreased standing balance, decreased postural control and decreased balance strategies and therefore will continue to benefit from skilled PT intervention to increase functional independence with mobility.  Patient progressing toward long term goals..  Continue plan of care.  PT Short Term Goals Week 2:  PT Short Term Goal 1 (Week 2): Pt will consistently perform functional transfers with min A PT Short Term Goal 1 - Progress (Week 2): Progressing toward goal PT Short Term Goal 2 (Week 2): pt will perform sitting balance without UE support with supervision for functional activities PT Short Term Goal 2 - Progress (Week 2): Met  Skilled Therapeutic Interventions/Progress Updates:  Ambulation/gait training;Discharge planning;Functional mobility training;Psychosocial support;Therapeutic Activities;Wheelchair propulsion/positioning;Therapeutic Exercise;Skin care/wound management;Neuromuscular re-education;Disease management/prevention;Balance/vestibular training;DME/adaptive equipment instruction;UE/LE Strength taining/ROM;UE/LE Coordination activities;Stair training;Patient/family education;Functional electrical stimulation;Community reintegration;Pain management    See Function Navigator for Current Functional  Status.   Kenneth Pace 01/29/2017, 8:26 AM

## 2017-01-29 NOTE — Progress Notes (Signed)
Occupational Therapy Session Note  Patient Details  Name: Kenneth Pace MRN: [id number] Date of Birth: 07-13-1950  Today's Date: 01/29/2017 OT Individual Time: 8891-6945 OT Individual Time Calculation (min): 75 min    Short Term Goals: Week 3:  OT Short Term Goal 1 (Week 3): Cont to work towards LTG, awaiting surgery next week  Skilled Therapeutic Interventions/Progress Updates:    Pt seen for OT ADL bathing/dressing session. Pt in supine upon arrival, ready for tx session. He transferred to EOB with supervision using hospital bed functions. Sit>stand from EOB with min to RW and ambulated into bathroom with CA demonstrating much imrpoved foot clearance during ambulation compared to previous sessions. He bathed seated on tub bench, difficulty maintaining grasp on large soap bottle, and requiring assist for B feet.  He ambulated out of bathroom and dressed seated in w/c, using AE to assist with threading LEs into pants. HE stood with min-mod A and mod A steadying support while he pulled pants with with increased time, posterior lean. He donned socks/shoes using AE with min A to stabilize shoes while he used shoehorn. Per pt request, taken to therapy day room total A in w/c for time and energy conservation. From w/c level, he was able to make himself cup of coffee with increased time for fine motor aspects. Pt then taken to therapy gym. Min A stand pivot to mat. Pt placed in prone position for stretching, pt voiced tolerating it well and agreeable to stay in this position until PT session  In PT minutes, staff available in gym to assist for change in position if needed and pt's PT made aware.    Therapy Documentation Precautions:  Precautions Precautions: Fall Restrictions Weight Bearing Restrictions: No Pain:   R deltoid pain. RN made aware, Repositioned  See Function Navigator for Current Functional Status.   Therapy/Group: Individual Therapy  Dunya Meiners L 01/29/2017, 7:10  AM

## 2017-01-29 NOTE — Progress Notes (Signed)
Home unit CPAP 

## 2017-01-30 ENCOUNTER — Inpatient Hospital Stay (HOSPITAL_COMMUNITY): Payer: BLUE CROSS/BLUE SHIELD | Admitting: Occupational Therapy

## 2017-01-30 ENCOUNTER — Inpatient Hospital Stay (HOSPITAL_COMMUNITY): Payer: BLUE CROSS/BLUE SHIELD | Admitting: Physical Therapy

## 2017-01-30 DIAGNOSIS — N319 Neuromuscular dysfunction of bladder, unspecified: Secondary | ICD-10-CM

## 2017-01-30 LAB — GLUCOSE, CAPILLARY
GLUCOSE-CAPILLARY: 217 mg/dL — AB (ref 65–99)
GLUCOSE-CAPILLARY: 148 mg/dL — AB (ref 65–99)
Glucose-Capillary: 92 mg/dL (ref 65–99)
Glucose-Capillary: 191 mg/dL — ABNORMAL HIGH (ref 65–99)
Glucose-Capillary: 167 mg/dL — ABNORMAL HIGH (ref 65–99)

## 2017-01-30 MED ORDER — BETHANECHOL CHLORIDE 25 MG PO TABS
25.0000 mg | ORAL_TABLET | Freq: Three times a day (TID) | ORAL | Status: DC
  Administered 2017-01-30 – 2017-01-31 (×6): 25 mg via ORAL
  Filled 2017-01-30 (×6): qty 1

## 2017-01-30 NOTE — Plan of Care (Signed)
  RH BLADDER ELIMINATION RH STG MANAGE BLADDER WITH ASSISTANCE Description STG Manage Bladder With Mod I   In and out cath q 6hours 01/30/2017 1445 - Not Progressing by Ander Slade, RN

## 2017-01-30 NOTE — Progress Notes (Signed)
Physical Therapy Session Note  Patient Details  Name: MONROE QIN MRN: [id number] Date of Birth: [birth year]  Today's Date: 01/30/2017 PT Individual Time: 0800-0900 PT Individual Time Calculation (min): 60 min   Short Term Goals: Week 3:  PT Short Term Goal 1 (Week 3): Pt will consistently perform functional transfers with S, directing care for setup PT Short Term Goal 2 (Week 3): Pt will ambulate 10' consistent minA with AD PT Short Term Goal 3 (Week 3): Pt will perform supine <>sit with consistent minA   Skilled Therapeutic Interventions/Progress Updates: Pt received seated in bed, denies pain and agreeable to treatment. Supine>sit with S, HOB elevated and bedrails. Pt directed care for setup of w/c, requiring min cues from therapist. Squat pivot transfer to w/c minA and assist to stabilize equipment. Pt performs hygiene and grooming at sink with modI at w/c level. W/c propulsion x150' with BUE for strengthening and aerobic endurance. Stand pivot transfer w/c >mat table with RW and minA. Sit <>stand from elevated mat table 1x10 reps with min guard; tactile cues at quads/glutes for activation. Demonstrates good eccentric control stand >sit with occasional cues for forward trunk flexion to reduce posterior LOB. Standing balance on foam wedge with dynamic UE reaching for focus on ankle ROM and ankle righting, closed chain dorsiflexion; several LOBs posteriorly, requiring cues at glutes/chest to facilitate hip/trunk extension to recover from LOB. Stand pivot to return to w/c with minA and RW. W/c propulsion to return to room. Remained seated in w/c at end of session with plan to return to bed 20-30 min later to allow supine rest break before next session, all needs in reach.      Therapy Documentation Precautions:  Precautions Precautions: Fall Restrictions Weight Bearing Restrictions: No   See Function Navigator for Current Functional Status.   Therapy/Group: Individual  Therapy  Luberta Mutter 01/30/2017, 8:57 AM

## 2017-01-30 NOTE — Progress Notes (Signed)
[facility]     PROGRESS NOTE  Subjective/Complaints:  No new complaints today.  He is still not emptying his bladder with residual volumes 800 cc or more.  Denies pain.  He was that he does a little better in therapy yesterday  ROS: pt denies nausea, vomiting, diarrhea, cough, shortness of breath or chest pain   Objective: Vital Signs: Blood pressure 139/81, pulse 73, temperature 97.8 F (36.6 C), temperature source Oral, resp. rate 18, height 6' (1.829 m), weight 98.4 kg (216 lb 14.9 oz), SpO2 93 %. No results found. No results for input(s): WBC, HGB, HCT, PLT in the last 72 hours. No results for input(s): NA, K, CL, GLUCOSE, BUN, CREATININE, CALCIUM in the last 72 hours.  Invalid input(s): CO CBG (last 3)  Recent Labs    01/29/17 2132 01/30/17 0355 01/30/17 0658  GLUCAP 249* 217* 148*    Wt Readings from Last 3 Encounters:  01/28/17 98.4 kg (216 lb 14.9 oz)  01/10/17 102.4 kg (225 lb 12 oz)  11/27/16 104.3 kg (230 lb)    Physical Exam:  BP 139/81 (BP Location: Left Arm)   Pulse 73   Temp 97.8 F (36.6 C) (Oral)   Resp 18   Ht 6' (1.829 m)   Wt 98.4 kg (216 lb 14.9 oz)   SpO2 93%   BMI 29.42 kg/m  Constitutional: He appears well-developed and well-nourished.  HENT: Normocephalic and atraumatic.  Eyes: EOM are normal. No discharge. Cardiovascular: RRR without murmur. No JVD   Respiratory: CTA Bilaterally without wheezes or rales. Normal effort   GI: Bowel sounds are normal. He exhibits no distension.   Musculoskeletal: He exhibits no edema or tenderness.  Neurological: He is alert and oriented.  Speech clear.  Cognition intact and able to follow commands without difficulty.  Motor: B/l UE: 4+/5 proximal to distal (stable) B/l LE: HF 3-/5, KE 4-/5, 4+/5 ADF/PF--stable lower extremity exam Sensation to light touch diminished >> distal LE, now involving thorax--no changes today Skin: Skin is warm and dry.  Psychiatric: He has a  normal mood and affect. His behavior is normal. Judgment and thought content normal.   Assessment/Plan: 1. 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 Physiatrist is providing close team supervision and 24 hour management of active medical problems listed below. Physiatrist and rehab team continue to assess barriers to discharge/monitor patient progress toward functional and medical goals.  Function:  Bathing Bathing position   Position: Shower  Bathing parts Body parts bathed by patient: Right arm, Right upper leg, Left arm, Left upper leg, Abdomen, Chest, Front perineal area, Buttocks Body parts bathed by helper: Right lower leg, Left lower leg, Back  Bathing assist Assist Level: Touching or steadying assistance(Pt > 75%)      Upper Body Dressing/Undressing Upper body dressing   What is the patient wearing?: Pull over shirt/dress     Pull over shirt/dress - Perfomed by patient: Thread/unthread right sleeve, Thread/unthread left sleeve, Put head through opening, Pull shirt over trunk Pull over shirt/dress - Perfomed by helper: Pull shirt over trunk        Upper body assist Assist Level: More than reasonable time   Set up : To obtain clothing/put away  Lower Body Dressing/Undressing Lower body dressing   What is the patient wearing?: Pants, Socks, Shoes Underwear - Performed by patient: Thread/unthread right underwear leg, Thread/unthread left underwear leg, Pull underwear  up/down Underwear - Performed by helper: Pull underwear up/down Pants- Performed by patient: Thread/unthread right pants leg, Thread/unthread left pants leg, Pull pants up/down Pants- Performed by helper: Pull pants up/down Non-skid slipper socks- Performed by patient: Don/doff right sock, Don/doff left sock(Sock aid)   Socks - Performed by patient: Don/doff right sock, Don/doff left  sock(Sock aid) Socks - Performed by helper: Don/doff right sock, Don/doff left sock Shoes - Performed by patient: Don/doff right shoe, Don/doff left shoe([place]) Shoes - Performed by helper: Don/doff right shoe, Don/doff left shoe          Lower body assist Assist for lower body dressing: Touching or steadying assistance (Pt > 75%)      Toileting Toileting   Toileting steps completed by patient: Adjust clothing prior to toileting, Adjust clothing after toileting Toileting steps completed by helper: Performs perineal hygiene Toileting Assistive Devices: Grab bar or rail  Toileting assist Assist level: Touching or steadying assistance (Pt.75%)   Transfers Chair/bed transfer   Chair/bed transfer method: Stand pivot Chair/bed transfer assist level: Touching or steadying assistance (Pt > 75%) Chair/bed transfer assistive device: Walker, Air cabin crew     Max distance: 4 Assist level: Moderate assist (Pt 50 - 74%)   Wheelchair   Type: Manual Max wheelchair distance: 150 Assist Level: Supervision or verbal cues  Cognition Comprehension Comprehension assist level: Follows complex conversation/direction with extra time/assistive device  Expression Expression assist level: Expresses complex 90% of the time/cues < 10% of the time  Social Interaction Social Interaction assist level: Interacts appropriately with others - No medications needed.  Problem Solving Problem solving assist level: Solves complex problems: With extra time  Memory Memory assist level: Recognizes or recalls 90% of the time/requires cueing < 10% of the time    Medical Problem List and Plan: 1.  Weakness, limitations with ADLs secondary to demyelinating myeloneuropathy, completed plasmapheresis and IVIG without substantial improvement.  Patient has cervical stenosis with symptomatic myelopathy as well   Cont CIR   Plan for cervical decompression on Monday. 2.  DVT  Prophylaxis/Anticoagulation: Pharmaceutical: Lovenox 3. Pain Management:    Flexeril PRN for muscle spasms with improvement   Gabapentin increased to 300 TID on 12/3, increased to 600 3 times a day on 12/5 - stable 4. Mood: Remains optimistic. LCSW to follow for evaluation and support.  5. Neuropsych: This patient is capable of making decisions on his own behalf. 6. Skin/Wound Care: Routine pressure relief measures. Maintain adequate nutritional and hydration status.  7. Fluids/Electrolytes/Nutrition: Monitor I/Os. Added nutritional supplement to help promote healing and for BS stabilization.  9.T2DM: Hgb A1c- 7.3 (managed by Dr. Loanne Drilling) Monitor BS ac/hs. Has insulin pump with monitor.      CBG (last 3)  Recent Labs    01/29/17 2132 01/30/17 0355 01/30/17 0658  GLUCAP 249* 217* 148*      Monitor with increased mobility   Poor control at present.  Follow for pattern today 10. BPH/Neurogenic bladder: On finasteride in am. Added flomax   Now with retention.  Requiring in and out caths    -Add Urecholine 25 mg 3 times daily 11. HTN: Monitor BP bid. Continue lisinopril Vitals:   01/29/17 2135 01/30/17 0611  BP: 138/78 139/81  Pulse:  73  Resp:  18  Temp:  97.8 F (36.6 C)  SpO2:  93%    Relatively controlled on 12/14 12. Anxiety disorder: Due to work stress and has been managed with buspar.  13. Constipation: Resolved  with bowel regimen of miralax bid. 14. Hyponatremia   Na 133 on 12/11   Labs ordered for Monday   Cont to monitor  LOS (Days) 19 A FACE TO FACE EVALUATION WAS PERFORMED  Jeny Nield T 01/30/2017 8:59 AM

## 2017-01-30 NOTE — Progress Notes (Addendum)
Occupational Therapy Session Note  Patient Details  Name: Kenneth Pace MRN: [id number] Date of Birth: 04-07-1950  Today's Date: 01/30/2017 OT Individual Time: 3664-4034 OT Individual Time Calculation (min): 45 min    Short Term Goals: Week 3:  OT Short Term Goal 1 (Week 3): Cont to work towards LTG, awaiting surgery next week  Skilled Therapeutic Interventions/Progress Updates:    Pt presented supine in bed sleeping, though arousable and agreeable to OT tx session. Pt completes bed mobility with supervision, increased time/effort. Completes squat pivot transfer EOB<>w/c during session with MinA. Transported Pt total assist via w/c for energy conservation to rehab gym. Pt participated in seated table top activity with focus on UE NMR and [place]. Pt requires increased time and effort for fine motor tasks. Transported Pt to dayroom where Pt engaged in standing activity alternating use of L/R UE. Pt required [employer] for sit<>stand and increased time. Pt requires MinA in standing, though fatiguing quickly, able to maintain standing approx 45 sec-63min before requiring return to sitting. Pt reported increased discomfort/tightness in UEs, pecs, and traps; completed seated UE stretches to alleviate symptoms with min verbal cues throughout. Transported Pt back to room and bed in manner described above where Pt was left supine in bed, call bell and needs within reach, NT present.   Therapy Documentation Precautions:  Precautions Precautions: Fall Restrictions Weight Bearing Restrictions: No    Pain: Pain Assessment Pain Assessment: No/denies pain  See Function Navigator for Current Functional Status.   Therapy/Group: Individual Therapy  Raymondo Band 01/30/2017, 1:35 PM

## 2017-01-30 NOTE — Progress Notes (Signed)
Patient ID: Kenneth Pace, male   DOB: [birth year], 66 y.o.   MRN: [id number] Subjective: The patient is alert and pleasant.  He is in no apparent distress.  Objective: Vital signs in last 24 hours: Temp:  [98.7 F (37.1 C)] 98.7 F (37.1 C) (12/14 1500) Pulse Rate:  [85] 85 (12/14 1500) Resp:  [18] 18 (12/14 1500) BP: (138-143)/(78-80) 138/78 (12/14 2135) SpO2:  [98 %] 98 % (12/14 1500)  Intake/Output from previous day: 12/14 0701 - 12/15 0700 In: 840 [P.O.:840] Out: 2200 [Urine:2200] Intake/Output this shift: No intake/output data recorded.  Physical exam the patient is alert and pleasant.  He is quadriparetic.  Lab Results: No results for input(s): WBC, HGB, HCT, PLT in the last 72 hours. BMET No results for input(s): NA, K, CL, CO2, GLUCOSE, BUN, CREATININE, CALCIUM in the last 72 hours.  Studies/Results: No results found.  Assessment/Plan: Cervical stenosis: Dr. Kathyrn Sheriff planned surgery on Monday.  I have answered all the patient's questions.  LOS: 19 days     Ophelia Charter 01/30/2017, 7:39 AM

## 2017-01-30 NOTE — Progress Notes (Signed)
Occupational Therapy Session Note  Patient Details  Name: EROL FLANAGIN MRN: [id number] Date of Birth: [birth year]  Today's Date: 01/30/2017 OT Individual Time: 1447-1530 OT Individual Time Calculation (min): 43 min    Short Term Goals: Week 3:  OT Short Term Goal 1 (Week 3): Cont to work towards LTG, awaiting surgery next week  Skilled Therapeutic Interventions/Progress Updates:    Tx focus on experience-dependent neuroplasticity, functional transfers, and activity tolerance during meaningful leisure participation.   Pt greeted supine in bed with family present. Agreeable to tx. Declining ADL engagement. He ambulated short distance to w/c with Min A and RW. Pt escorted to therapy gym for energy conservation and transferred to another chair. While seated, pt engaged in mechanical repair of his own w/c. Pt retrieving tools from toolbox and manipulating tools with bilateral UEs to fix brake mechanism and also anti-tippers. He appeared to experience flow during task, educating OT on w/c mechanics, openly collaborating when problem solving during task. Afterwards pt was pleased with his handiwork, he completed stand pivot<w/c with Min A.  Managed leg rests himself with setup and extra time. Pt taken back to room and agreeable to sit up for a few hours in w/c. Pt left with all needs and NT at session exit.   Therapy Documentation Precautions:  Precautions Precautions: Fall Restrictions Weight Bearing Restrictions: No Pain: No c/o pain during tx Pain Assessment Pain Assessment: No/denies pain ADL:      See Function Navigator for Current Functional Status.   Therapy/Group: Individual Therapy  [name] A Cressida Milford 01/30/2017, 3:55 PM

## 2017-01-31 ENCOUNTER — Other Ambulatory Visit: Payer: Self-pay

## 2017-01-31 ENCOUNTER — Inpatient Hospital Stay (HOSPITAL_COMMUNITY): Payer: BLUE CROSS/BLUE SHIELD | Admitting: Occupational Therapy

## 2017-01-31 ENCOUNTER — Inpatient Hospital Stay (HOSPITAL_COMMUNITY): Payer: BLUE CROSS/BLUE SHIELD

## 2017-01-31 LAB — GLUCOSE, CAPILLARY
GLUCOSE-CAPILLARY: 126 mg/dL — AB (ref 65–99)
GLUCOSE-CAPILLARY: 347 mg/dL — AB (ref 65–99)
GLUCOSE-CAPILLARY: 76 mg/dL (ref 65–99)
Glucose-Capillary: 219 mg/dL — ABNORMAL HIGH (ref 65–99)
Glucose-Capillary: 111 mg/dL — ABNORMAL HIGH (ref 65–99)
Glucose-Capillary: 55 mg/dL — ABNORMAL LOW (ref 65–99)

## 2017-01-31 LAB — SURGICAL PCR SCREEN
MRSA, PCR: NEGATIVE
STAPHYLOCOCCUS AUREUS: NEGATIVE

## 2017-01-31 MED ORDER — CHLORHEXIDINE GLUCONATE CLOTH 2 % EX PADS
6.0000 | MEDICATED_PAD | Freq: Once | CUTANEOUS | Status: AC
  Administered 2017-01-31: 6 via TOPICAL

## 2017-01-31 MED ORDER — CEFAZOLIN SODIUM-DEXTROSE 2-4 GM/100ML-% IV SOLN: 2.0000 g | INTRAVENOUS | Status: DC

## 2017-01-31 MED ORDER — CHLORHEXIDINE GLUCONATE CLOTH 2 % EX PADS: 6.0000 | MEDICATED_PAD | Freq: Once | CUTANEOUS | Status: DC

## 2017-01-31 NOTE — Plan of Care (Signed)
  RH BOWEL ELIMINATION RH STG MANAGE BOWEL WITH ASSISTANCE Description STG Manage Bowel with Mod I.  ' milk of mag /prune juice given 01/31/2017 1548 - Not Progressing by Ander Slade, RN   RH BLADDER ELIMINATION RH STG MANAGE BLADDER WITH ASSISTANCE Description STG Manage Bladder With Mod I  ; cath q 6 hours 01/31/2017 1548 - Not Progressing by Ander Slade, RN

## 2017-01-31 NOTE — Plan of Care (Signed)
I/O cath  . 

## 2017-01-31 NOTE — Significant Event (Signed)
Hypoglycemic Event  CBG: 55  Treatment: 15 GM carbohydrate snack  Symptoms: None  Follow-up CBG: Time: 2215 CBG Result: 111  Possible Reasons for Event: Unknown  Comments/MD notified:yes    Kenneth Pace  Silverio Lay

## 2017-01-31 NOTE — Progress Notes (Signed)
Occupational Therapy Session Note  Patient Details  Name: Kenneth Pace MRN: [id number] Date of Birth: 1950/10/17  Today's Date: 01/31/2017 OT Individual Time: 0800-0900 and 1330-1415 OT Individual Time Calculation (min): 60 min and 45 min   Short Term Goals: Week 1:  OT Short Term Goal 1 (Week 1): Pt will complete 2/3 toileting tasks with steadying assist OT Short Term Goal 1 - Progress (Week 1): Not met OT Short Term Goal 2 (Week 1): Pt will don shirt with set-up OT Short Term Goal 2 - Progress (Week 1): Met OT Short Term Goal 3 (Week 1): Pt will don pants and socks using AE PRN with min A OT Short Term Goal 3 - Progress (Week 1): Met OT Short Term Goal 4 (Week 1): Pt will complete grooming task in standing with CGA for balance OT Short Term Goal 4 - Progress (Week 1): Not met Week 2:  OT Short Term Goal 1 (Week 2): Pt will consistently complete stand pivot transfers with min A OT Short Term Goal 1 - Progress (Week 2): Progressing toward goal OT Short Term Goal 2 (Week 2): Pt will manage pants during LB dressing with steayding assist OT Short Term Goal 2 - Progress (Week 2): Met OT Short Term Goal 3 (Week 2): Pt will stand to complete grooming task with steadying assist OT Short Term Goal 3 - Progress (Week 2): Met Week 3:  OT Short Term Goal 1 (Week 3): Cont to work towards LTG, awaiting surgery next week     Skilled Therapeutic Interventions/Progress Updates:    Visit 1:  No c/o pain. Pt seen for ADL retraining of shower and dressing. Pt worked on sit to stand and ambulating with RW to shower and then out of shower with W/c.  Pt completed grooming at the sink. Then dressing using AE with steadying A in standing to pull pants over hips.  No spasms noted today.  Pt was having the most difficulty today with decreased grasp strength. Otherwise good activity tolerance.  Pt resting in w/c with all needs met.  Visit 2: no c/o pain.  Pt seen this session to work on UE strength with  a focus on grasp/ forearm strength. Pt received in bed, completed bed mobility with min assist and min A to w/c with RW.  Pt worked on endurance and arm strength with pushing w.c to gym.  He worked on holding a upside down cone with 2 lb wts on wrist flexion/ extension and add/abd to build forearm strength 10-15 x each hand 3 sets.   Holding 1 lb wts pt worked on high reps of shoulder arm circles and push pull exercises.  Pt continues to have L deltoid pain so avoided overhead exercises.  Pt then propelled himself back to room.    Therapy Documentation Precautions:  Precautions Precautions: Fall Restrictions Weight Bearing Restrictions: No    Vital Signs: Therapy Vitals Temp: 97.8 F (36.6 C) Temp Source: Oral Pulse Rate: 68 Resp: 18 BP: (!) 153/83 Patient Position (if appropriate): Lying Oxygen Therapy SpO2: 96 % O2 Device: Not Delivered  ADL:    See Function Navigator for Current Functional Status.   Therapy/Group: Individual Therapy  SAGUIER,JULIA 01/31/2017, 7:57 AM

## 2017-01-31 NOTE — Progress Notes (Signed)
Patient ID: Kenneth Pace, male   DOB: 1950/04/16, 66 y.o.   MRN: [id number] Subjective: The patient is alert and pleasant.  He is in no apparent distress.  Objective: Vital signs in last 24 hours: Temp:  [97.8 F (36.6 C)-98.2 F (36.8 C)] 97.8 F (36.6 C) (12/16 0559) Pulse Rate:  [68-81] 68 (12/16 0559) Resp:  [18] 18 (12/16 0559) BP: (136-153)/(73-90) 153/83 (12/16 0559) SpO2:  [96 %-99 %] 96 % (12/16 0559) Weight:  [98.5 kg (217 lb 2.5 oz)] 98.5 kg (217 lb 2.5 oz) (12/16 0559)  Intake/Output from previous day: 12/15 0701 - 12/16 0700 In: 650 [P.O.:650] Out: 3420 [Urine:3420] Intake/Output this shift: Total I/O In: 120 [P.O.:120] Out: -   Physical exam the patient is alert and pleasant.  He is quadriparetic.  Lab Results: No results for input(s): WBC, HGB, HCT, PLT in the last 72 hours. BMET No results for input(s): NA, K, CL, CO2, GLUCOSE, BUN, CREATININE, CALCIUM in the last 72 hours.  Studies/Results: No results found.  Assessment/Plan: Cervical spondylosis, cervical stenosis, cervical myelopathy: I have answered all the patient's questions regarding surgery.  He wants to proceed as scheduled with surgery with Dr. Kathyrn Sheriff tomorrow.  LOS: 20 days     Ophelia Charter 01/31/2017, 10:14 AM

## 2017-01-31 NOTE — Progress Notes (Signed)
[facility]     PROGRESS NOTE  Subjective/Complaints:  Still not emptying bladder but has some sense of his bladder being full with urge to void.  Denies pain.    ROS: pt denies nausea, vomiting, diarrhea, cough, shortness of breath or chest pain   Objective: Vital Signs: Blood pressure (!) 153/83, pulse 68, temperature 97.8 F (36.6 C), temperature source Oral, resp. rate 18, height 6' (1.829 m), weight 98.5 kg (217 lb 2.5 oz), SpO2 96 %. No results found. No results for input(s): WBC, HGB, HCT, PLT in the last 72 hours. No results for input(s): NA, K, CL, GLUCOSE, BUN, CREATININE, CALCIUM in the last 72 hours.  Invalid input(s): CO CBG (last 3)  Recent Labs    01/30/17 2114 01/31/17 0205 01/31/17 0650  GLUCAP 167* 126* 219*    Wt Readings from Last 3 Encounters:  01/31/17 98.5 kg (217 lb 2.5 oz)  01/10/17 102.4 kg (225 lb 12 oz)  11/27/16 104.3 kg (230 lb)    Physical Exam:  BP (!) 153/83 (BP Location: Left Arm)   Pulse 68   Temp 97.8 F (36.6 C) (Oral)   Resp 18   Ht 6' (1.829 m)   Wt 98.5 kg (217 lb 2.5 oz)   SpO2 96%   BMI 29.45 kg/m  Constitutional: He appears well-developed and well-nourished.  HENT: Normocephalic and atraumatic.  Eyes: EOM are normal. No discharge. Cardiovascular: RRR without murmur. No JVD    Respiratory: CTA Bilaterally without wheezes or rales. Normal effort   GI: Bowel sounds are normal. He exhibits no distension.   Musculoskeletal: He exhibits no edema or tenderness.  Neurological: He is alert and oriented.  Speech clear.  Cognition intact and able to follow commands without difficulty.  Motor: B/l UE: 4+/5 proximal to distal (stable) B/l LE: HF 3-/5, KE 4-/5, 4+/5 ADF/PF--stable lower extremity exam Sensation to light touch diminished >> distal LE, now involving thorax--no changes today Skin: Skin is warm and dry.  Psychiatric: He has a normal mood and affect. His behavior is normal. Judgment and  thought content normal.   Assessment/Plan: 1. 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 Physiatrist is providing close team supervision and 24 hour management of active medical problems listed below. Physiatrist and rehab team continue to assess barriers to discharge/monitor patient progress toward functional and medical goals.  Function:  Bathing Bathing position   Position: Shower  Bathing parts Body parts bathed by patient: Right arm, Right upper leg, Left arm, Left upper leg, Abdomen, Chest, Front perineal area, Buttocks Body parts bathed by helper: Right lower leg, Left lower leg, Back  Bathing assist Assist Level: Touching or steadying assistance(Pt > 75%)      Upper Body Dressing/Undressing Upper body dressing   What is the patient wearing?: Pull over shirt/dress     Pull over shirt/dress - Perfomed by patient: Thread/unthread right sleeve, Thread/unthread left sleeve, Put head through opening, Pull shirt over trunk Pull over shirt/dress - Perfomed by helper: Pull shirt over trunk        Upper body assist Assist Level: More than reasonable time   Set up : To obtain clothing/put away  Lower Body Dressing/Undressing Lower body dressing   What is the patient wearing?: Pants, Socks, Shoes Underwear - Performed by patient: Thread/unthread right underwear leg, Thread/unthread left underwear leg, Pull underwear up/down Underwear - Performed by helper: Pull underwear up/down  Pants- Performed by patient: Thread/unthread right pants leg, Thread/unthread left pants leg, Pull pants up/down Pants- Performed by helper: Pull pants up/down Non-skid slipper socks- Performed by patient: Don/doff right sock, Don/doff left sock(Sock aid)   Socks - Performed by patient: Don/doff right sock, Don/doff left sock(Sock aid) Socks - Performed by helper: Don/doff right  sock, Don/doff left sock Shoes - Performed by patient: Don/doff right shoe, Don/doff left shoe(Shoehorn) Shoes - Performed by helper: Don/doff right shoe, Don/doff left shoe          Lower body assist Assist for lower body dressing: Touching or steadying assistance (Pt > 75%)      Toileting Toileting   Toileting steps completed by patient: Adjust clothing prior to toileting, Adjust clothing after toileting Toileting steps completed by helper: Adjust clothing prior to toileting, Performs perineal hygiene, Adjust clothing after toileting Toileting Assistive Devices: Grab bar or rail  Toileting assist Assist level: Touching or steadying assistance (Pt.75%)   Transfers Chair/bed transfer   Chair/bed transfer method: Stand pivot Chair/bed transfer assist level: Touching or steadying assistance (Pt > 75%) Chair/bed transfer assistive device: Walker, Air cabin crew     Max distance: 4 Assist level: Moderate assist (Pt 50 - 74%)   Wheelchair   Type: Manual Max wheelchair distance: 150 Assist Level: Supervision or verbal cues  Cognition Comprehension Comprehension assist level: Follows complex conversation/direction with no assist  Expression Expression assist level: Expresses complex ideas: With no assist  Social Interaction Social Interaction assist level: Interacts appropriately with others - No medications needed.  Problem Solving Problem solving assist level: Solves complex problems: Recognizes & self-corrects  Memory Memory assist level: Recognizes or recalls 90% of the time/requires cueing < 10% of the time    Medical Problem List and Plan: 1.  Weakness, limitations with ADLs secondary to demyelinating myeloneuropathy, completed plasmapheresis and IVIG without substantial improvement.  Patient has cervical stenosis with symptomatic myelopathy as well   Cont CIR   Plan for cervical decompression tomorrow 2.  DVT Prophylaxis/Anticoagulation:  Pharmaceutical: Lovenox 3. Pain Management:    Flexeril PRN for muscle spasms with improvement   Gabapentin increased to 300 TID on 12/3, increased to 600 3 times a day on 12/5 - stable 4. Mood: Remains optimistic. LCSW to follow for evaluation and support.  5. Neuropsych: This patient is capable of making decisions on his own behalf. 6. Skin/Wound Care: Routine pressure relief measures. Maintain adequate nutritional and hydration status.  7. Fluids/Electrolytes/Nutrition: Monitor I/Os. Added nutritional supplement to help promote healing and for BS stabilization.  9.T2DM: Hgb A1c- 7.3 (managed by Dr. Loanne Drilling) Monitor BS ac/hs. Has insulin pump with monitor.      CBG (last 3)  Recent Labs    01/30/17 2114 01/31/17 0205 01/31/17 0650  GLUCAP 167* 126* 219*      Monitor with increased mobility   Poor control at present.  Follow for pattern today 10. BPH/Neurogenic bladder: On finasteride in am. Added flomax   Now with retention.  Requiring in and out caths    -Added Urecholine 25 mg 3 times daily--we will hold on further increases at present given planned surgery tomorrow 11. HTN: Monitor BP bid. Continue lisinopril Vitals:   01/30/17 2135 01/31/17 0559  BP: (!) 144/90 (!) 153/83  Pulse:  68  Resp:  18  Temp:  97.8 F (36.6 C)  SpO2:  96%    Relatively controlled on 12/14 12. Anxiety disorder: Due to work stress and has been managed  with buspar.  13. Constipation: Resolved with bowel regimen of miralax bid. 14. Hyponatremia   Na 133 on 12/11   Labs ordered for Monday   Cont to monitor  LOS (Days) 20 A FACE TO FACE EVALUATION WAS PERFORMED  Khyri Hinzman T 01/31/2017 8:37 AM

## 2017-01-31 NOTE — Progress Notes (Signed)
Physical Therapy Session Note  Patient Details  Name: Kenneth Pace MRN: [id number] Date of Birth: 01/02/1951  Today's Date: 01/31/2017 PT Individual Time: 1440-1525 PT Individual Time Calculation (min): 45 min   Short Term Goals: Week 3:  PT Short Term Goal 1 (Week 3): Pt will consistently perform functional transfers with S, directing care for setup PT Short Term Goal 2 (Week 3): Pt will ambulate 10' consistent minA with AD PT Short Term Goal 3 (Week 3): Pt will perform supine <>sit with consistent minA  Skilled Therapeutic Interventions/Progress Updates:    Pt seated in w/c upon PT arrival, agreeable to therapy tx and denies pain. Pt performed squat pivot transfers from w/c<>mat with min assist. Pt seated edge of mat worked on dynamic seated balance and core activation in order to lean posteriorly and then come back up into sitting upright. Pt demonstrates poor ability to lean posteriorly in sitting without LOB. Pt performed x 5 sit<>stands with min assist from elevated mat, verbal and tactile cues for hip/knee extension. Pt performed 2 x 10 LAQ in sitting with each LE. Pt performed 2 x 5 each side going from sitting<>sidelying on elbow working on core activation. Pt propelled w/c back to room and left seated in w/c with needs in reach.   Therapy Documentation Precautions:  Precautions Precautions: Fall Restrictions Weight Bearing Restrictions: No   See Function Navigator for Current Functional Status.   Therapy/Group: Individual Therapy  Netta Corrigan, PT, DPT 01/31/2017, 7:48 AM

## 2017-02-01 ENCOUNTER — Inpatient Hospital Stay (HOSPITAL_COMMUNITY): Payer: BLUE CROSS/BLUE SHIELD | Admitting: Certified Registered"

## 2017-02-01 ENCOUNTER — Other Ambulatory Visit: Payer: Self-pay

## 2017-02-01 ENCOUNTER — Inpatient Hospital Stay (HOSPITAL_COMMUNITY): Payer: BLUE CROSS/BLUE SHIELD

## 2017-02-01 ENCOUNTER — Encounter (HOSPITAL_COMMUNITY)
Admission: RE | Disposition: A | Discharge status: Rehabilitation Facility | Payer: Self-pay | Source: Home / Self Care | Attending: Neurosurgery

## 2017-02-01 ENCOUNTER — Encounter (HOSPITAL_COMMUNITY): Payer: Self-pay | Admitting: Surgery

## 2017-02-01 ENCOUNTER — Inpatient Hospital Stay (HOSPITAL_COMMUNITY)
Admission: RE | Admit: 2017-02-01 | Discharge: 2017-02-03 | DRG: 472 | Disposition: A | Discharge status: Rehabilitation Facility | Payer: BLUE CROSS/BLUE SHIELD | Attending: Neurosurgery | Admitting: Neurosurgery

## 2017-02-01 DIAGNOSIS — M792 Neuralgia and neuritis, unspecified: Secondary | ICD-10-CM | POA: Diagnosis not present

## 2017-02-01 DIAGNOSIS — R7309 Other abnormal glucose: Secondary | ICD-10-CM | POA: Diagnosis not present

## 2017-02-01 DIAGNOSIS — Z9641 Presence of insulin pump (external) (internal): Secondary | ICD-10-CM | POA: Diagnosis present

## 2017-02-01 DIAGNOSIS — R0602 Shortness of breath: Secondary | ICD-10-CM | POA: Diagnosis not present

## 2017-02-01 DIAGNOSIS — G8194 Hemiplegia, unspecified affecting left nondominant side: Secondary | ICD-10-CM | POA: Diagnosis present

## 2017-02-01 DIAGNOSIS — R531 Weakness: Secondary | ICD-10-CM | POA: Diagnosis not present

## 2017-02-01 DIAGNOSIS — G378 Other specified demyelinating diseases of central nervous system: Secondary | ICD-10-CM | POA: Diagnosis not present

## 2017-02-01 DIAGNOSIS — F411 Generalized anxiety disorder: Secondary | ICD-10-CM | POA: Diagnosis not present

## 2017-02-01 DIAGNOSIS — N400 Enlarged prostate without lower urinary tract symptoms: Secondary | ICD-10-CM | POA: Diagnosis present

## 2017-02-01 DIAGNOSIS — E876 Hypokalemia: Secondary | ICD-10-CM | POA: Diagnosis not present

## 2017-02-01 DIAGNOSIS — R338 Other retention of urine: Secondary | ICD-10-CM | POA: Diagnosis not present

## 2017-02-01 DIAGNOSIS — G959 Disease of spinal cord, unspecified: Secondary | ICD-10-CM | POA: Diagnosis not present

## 2017-02-01 DIAGNOSIS — K5901 Slow transit constipation: Secondary | ICD-10-CM | POA: Diagnosis not present

## 2017-02-01 DIAGNOSIS — I10 Essential (primary) hypertension: Secondary | ICD-10-CM | POA: Diagnosis not present

## 2017-02-01 DIAGNOSIS — Z79899 Other long term (current) drug therapy: Secondary | ICD-10-CM | POA: Diagnosis not present

## 2017-02-01 DIAGNOSIS — E1142 Type 2 diabetes mellitus with diabetic polyneuropathy: Secondary | ICD-10-CM | POA: Diagnosis not present

## 2017-02-01 DIAGNOSIS — M4712 Other spondylosis with myelopathy, cervical region: Secondary | ICD-10-CM | POA: Diagnosis not present

## 2017-02-01 DIAGNOSIS — M50023 Cervical disc disorder at C6-C7 level with myelopathy: Secondary | ICD-10-CM | POA: Diagnosis not present

## 2017-02-01 DIAGNOSIS — M62838 Other muscle spasm: Secondary | ICD-10-CM | POA: Diagnosis not present

## 2017-02-01 DIAGNOSIS — E119 Type 2 diabetes mellitus without complications: Secondary | ICD-10-CM | POA: Diagnosis not present

## 2017-02-01 DIAGNOSIS — M4802 Spinal stenosis, cervical region: Principal | ICD-10-CM | POA: Diagnosis present

## 2017-02-01 DIAGNOSIS — E109 Type 1 diabetes mellitus without complications: Secondary | ICD-10-CM | POA: Diagnosis present

## 2017-02-01 DIAGNOSIS — E1165 Type 2 diabetes mellitus with hyperglycemia: Secondary | ICD-10-CM | POA: Diagnosis not present

## 2017-02-01 DIAGNOSIS — E162 Hypoglycemia, unspecified: Secondary | ICD-10-CM | POA: Diagnosis not present

## 2017-02-01 DIAGNOSIS — R339 Retention of urine, unspecified: Secondary | ICD-10-CM | POA: Diagnosis not present

## 2017-02-01 DIAGNOSIS — N319 Neuromuscular dysfunction of bladder, unspecified: Secondary | ICD-10-CM | POA: Diagnosis not present

## 2017-02-01 DIAGNOSIS — E785 Hyperlipidemia, unspecified: Secondary | ICD-10-CM | POA: Diagnosis not present

## 2017-02-01 DIAGNOSIS — M50021 Cervical disc disorder at C4-C5 level with myelopathy: Secondary | ICD-10-CM | POA: Diagnosis not present

## 2017-02-01 DIAGNOSIS — Z794 Long term (current) use of insulin: Secondary | ICD-10-CM | POA: Diagnosis not present

## 2017-02-01 DIAGNOSIS — N401 Enlarged prostate with lower urinary tract symptoms: Secondary | ICD-10-CM | POA: Diagnosis not present

## 2017-02-01 DIAGNOSIS — M4322 Fusion of spine, cervical region: Secondary | ICD-10-CM | POA: Diagnosis not present

## 2017-02-01 DIAGNOSIS — G992 Myelopathy in diseases classified elsewhere: Secondary | ICD-10-CM | POA: Diagnosis present

## 2017-02-01 DIAGNOSIS — E871 Hypo-osmolality and hyponatremia: Secondary | ICD-10-CM

## 2017-02-01 DIAGNOSIS — G4733 Obstructive sleep apnea (adult) (pediatric): Secondary | ICD-10-CM | POA: Diagnosis not present

## 2017-02-01 DIAGNOSIS — G8191 Hemiplegia, unspecified affecting right dominant side: Secondary | ICD-10-CM | POA: Diagnosis not present

## 2017-02-01 DIAGNOSIS — M50022 Cervical disc disorder at C5-C6 level with myelopathy: Secondary | ICD-10-CM | POA: Diagnosis not present

## 2017-02-01 DIAGNOSIS — G8250 Quadriplegia, unspecified: Secondary | ICD-10-CM | POA: Diagnosis not present

## 2017-02-01 DIAGNOSIS — R1310 Dysphagia, unspecified: Secondary | ICD-10-CM | POA: Diagnosis not present

## 2017-02-01 DIAGNOSIS — Z419 Encounter for procedure for purposes other than remedying health state, unspecified: Secondary | ICD-10-CM

## 2017-02-01 DIAGNOSIS — G9349 Other encephalopathy: Secondary | ICD-10-CM | POA: Diagnosis not present

## 2017-02-01 DIAGNOSIS — G610 Guillain-Barre syndrome: Secondary | ICD-10-CM | POA: Diagnosis not present

## 2017-02-01 DIAGNOSIS — E860 Dehydration: Secondary | ICD-10-CM | POA: Diagnosis not present

## 2017-02-01 HISTORY — PX: ANTERIOR CERVICAL DECOMP/DISCECTOMY FUSION: SHX1161

## 2017-02-01 LAB — BASIC METABOLIC PANEL
Anion gap: 8 (ref 5–15)
BUN: 25 mg/dL — ABNORMAL HIGH (ref 6–20)
CALCIUM: 8.7 mg/dL — AB (ref 8.9–10.3)
CO2: 24 mmol/L (ref 22–32)
CREATININE: 0.82 mg/dL (ref 0.61–1.24)
Chloride: 101 mmol/L (ref 101–111)
GFR calc non Af Amer: >60 mL/min (ref >60)
Glucose, Bld: 171 mg/dL — ABNORMAL HIGH (ref 65–99)
Potassium: 3.6 mmol/L (ref 3.5–5.1)
SODIUM: 133 mmol/L — AB (ref 135–145)

## 2017-02-01 LAB — GLUCOSE, CAPILLARY
GLUCOSE-CAPILLARY: 156 mg/dL — AB (ref 65–99)
GLUCOSE-CAPILLARY: 199 mg/dL — AB (ref 65–99)
GLUCOSE-CAPILLARY: 359 mg/dL — AB (ref 65–99)
GLUCOSE-CAPILLARY: 319 mg/dL — AB (ref 65–99)
GLUCOSE-CAPILLARY: 326 mg/dL — AB (ref 65–99)
GLUCOSE-CAPILLARY: 264 mg/dL — AB (ref 65–99)
Glucose-Capillary: 157 mg/dL — ABNORMAL HIGH (ref 65–99)

## 2017-02-01 LAB — ACETYLCHOLINE RECEPTOR AB, ALL
Acety choline binding ab: 0.11 nmol/L (ref 0.00–0.24)
Acetylchol Block Ab: 8 % (ref 0–25)
Acetylcholine Modulat Ab: <12 % (ref 0–20)

## 2017-02-01 LAB — ABO/RH: ABO/RH(D): O POS

## 2017-02-01 LAB — TYPE AND SCREEN
ABO/RH(D): O POS
ANTIBODY SCREEN: NEGATIVE

## 2017-02-01 SURGERY — ANTERIOR CERVICAL DECOMPRESSION/DISCECTOMY FUSION 3 LEVELS
Anesthesia: General | Site: Neck

## 2017-02-01 MED ORDER — CEFAZOLIN SODIUM-DEXTROSE 2-4 GM/100ML-% IV SOLN
INTRAVENOUS | Status: AC
  Filled 2017-02-01: qty 100

## 2017-02-01 MED ORDER — KETOROLAC TROMETHAMINE 15 MG/ML IJ SOLN
15.0000 mg | Freq: Four times a day (QID) | INTRAMUSCULAR | Status: AC
  Administered 2017-02-02: 15 mg via INTRAVENOUS
  Filled 2017-02-01 (×3): qty 1

## 2017-02-01 MED ORDER — SUGAMMADEX SODIUM 200 MG/2ML IV SOLN
INTRAVENOUS | Status: AC
  Filled 2017-02-01: qty 2

## 2017-02-01 MED ORDER — FLEET ENEMA 7-19 GM/118ML RE ENEM: 1.0000 | ENEMA | Freq: Once | RECTAL | Status: DC | PRN

## 2017-02-01 MED ORDER — ONDANSETRON HCL 4 MG PO TABS: 4.0000 mg | ORAL_TABLET | Freq: Four times a day (QID) | ORAL | Status: DC | PRN

## 2017-02-01 MED ORDER — LIDOCAINE 2% (20 MG/ML) 5 ML SYRINGE
INTRAMUSCULAR | Status: AC
  Filled 2017-02-01: qty 5

## 2017-02-01 MED ORDER — DEXAMETHASONE SODIUM PHOSPHATE 10 MG/ML IJ SOLN
INTRAMUSCULAR | Status: DC | PRN
  Administered 2017-02-01: 10 mg via INTRAVENOUS

## 2017-02-01 MED ORDER — LACTATED RINGERS IV SOLN
INTRAVENOUS | Status: DC | PRN
  Administered 2017-02-01 (×2): via INTRAVENOUS

## 2017-02-01 MED ORDER — CRANBERRY 400 MG PO TABS: 2.0000 | ORAL_TABLET | Freq: Every day | ORAL | Status: DC

## 2017-02-01 MED ORDER — ACETAMINOPHEN 325 MG PO TABS: 650.0000 mg | ORAL_TABLET | ORAL | Status: DC | PRN

## 2017-02-01 MED ORDER — SUGAMMADEX SODIUM 200 MG/2ML IV SOLN
INTRAVENOUS | Status: DC | PRN
  Administered 2017-02-01: 200 mg via INTRAVENOUS

## 2017-02-01 MED ORDER — ONDANSETRON HCL 4 MG/2ML IJ SOLN
INTRAMUSCULAR | Status: AC
  Filled 2017-02-01: qty 2

## 2017-02-01 MED ORDER — PHENYLEPHRINE 40 MCG/ML (10ML) SYRINGE FOR IV PUSH (FOR BLOOD PRESSURE SUPPORT)
PREFILLED_SYRINGE | INTRAVENOUS | Status: AC
  Filled 2017-02-01: qty 10

## 2017-02-01 MED ORDER — LISINOPRIL 10 MG PO TABS
10.0000 mg | ORAL_TABLET | Freq: Every day | ORAL | Status: DC
  Filled 2017-02-01: qty 1

## 2017-02-01 MED ORDER — BUPIVACAINE HCL (PF) 0.5 % IJ SOLN
INTRAMUSCULAR | Status: AC
  Filled 2017-02-01: qty 30

## 2017-02-01 MED ORDER — METHOCARBAMOL 500 MG PO TABS: 500.0000 mg | ORAL_TABLET | Freq: Four times a day (QID) | ORAL | Status: DC | PRN

## 2017-02-01 MED ORDER — DEXAMETHASONE SODIUM PHOSPHATE 10 MG/ML IJ SOLN
INTRAMUSCULAR | Status: AC
  Filled 2017-02-01: qty 1

## 2017-02-01 MED ORDER — 0.9 % SODIUM CHLORIDE (POUR BTL) OPTIME
TOPICAL | Status: DC | PRN
  Administered 2017-02-01: 1000 mL

## 2017-02-01 MED ORDER — LIDOCAINE-EPINEPHRINE 1 %-1:100000 IJ SOLN
INTRAMUSCULAR | Status: DC | PRN
Start: 2017-02-01 — End: 2017-02-01
  Administered 2017-02-01: 3.5 mL

## 2017-02-01 MED ORDER — OXYCODONE HCL 5 MG PO TABS
ORAL_TABLET | ORAL | Status: AC
  Filled 2017-02-01: qty 2

## 2017-02-01 MED ORDER — POLYETHYLENE GLYCOL 3350 17 G PO PACK: 17.0000 g | PACK | Freq: Every day | ORAL | Status: DC | PRN

## 2017-02-01 MED ORDER — LISINOPRIL-HYDROCHLOROTHIAZIDE 10-12.5 MG PO TABS: 1.0000 | ORAL_TABLET | Freq: Every day | ORAL | Status: DC

## 2017-02-01 MED ORDER — MIDAZOLAM HCL 2 MG/2ML IJ SOLN
INTRAMUSCULAR | Status: DC | PRN
  Administered 2017-02-01: 2 mg via INTRAVENOUS

## 2017-02-01 MED ORDER — CEFAZOLIN SODIUM-DEXTROSE 2-3 GM-%(50ML) IV SOLR
INTRAVENOUS | Status: DC | PRN
  Administered 2017-02-01: 2 g via INTRAVENOUS

## 2017-02-01 MED ORDER — ROCURONIUM BROMIDE 10 MG/ML (PF) SYRINGE
PREFILLED_SYRINGE | INTRAVENOUS | Status: DC | PRN
  Administered 2017-02-01 (×2): 20 mg via INTRAVENOUS
  Administered 2017-02-01: 70 mg via INTRAVENOUS
  Administered 2017-02-01 (×3): 20 mg via INTRAVENOUS
  Administered 2017-02-01: 10 mg via INTRAVENOUS

## 2017-02-01 MED ORDER — VITAMIN B-12 1000 MCG PO TABS
1000.0000 ug | ORAL_TABLET | Freq: Every day | ORAL | Status: DC
  Filled 2017-02-01: qty 1

## 2017-02-01 MED ORDER — ONDANSETRON HCL 4 MG/2ML IJ SOLN
4.0000 mg | Freq: Four times a day (QID) | INTRAMUSCULAR | Status: DC | PRN
  Administered 2017-02-01: 4 mg via INTRAVENOUS
  Filled 2017-02-01: qty 2

## 2017-02-01 MED ORDER — PROPOFOL 10 MG/ML IV BOLUS
INTRAVENOUS | Status: AC
Start: 2017-02-01 — End: 2017-02-01
  Filled 2017-02-01: qty 20

## 2017-02-01 MED ORDER — COENZYME Q10 30 MG PO CAPS: 30.0000 mg | ORAL_CAPSULE | Freq: Every day | ORAL | Status: DC

## 2017-02-01 MED ORDER — HYDROCHLOROTHIAZIDE 12.5 MG PO CAPS
12.5000 mg | ORAL_CAPSULE | Freq: Every day | ORAL | Status: DC
  Filled 2017-02-01: qty 1

## 2017-02-01 MED ORDER — PANTOPRAZOLE SODIUM 40 MG IV SOLR
40.0000 mg | Freq: Every day | INTRAVENOUS | Status: DC
  Administered 2017-02-01: 40 mg via INTRAVENOUS
  Filled 2017-02-01: qty 40

## 2017-02-01 MED ORDER — HYDROMORPHONE HCL 1 MG/ML IJ SOLN
INTRAMUSCULAR | Status: AC
  Filled 2017-02-01: qty 1

## 2017-02-01 MED ORDER — EPHEDRINE SULFATE-NACL 50-0.9 MG/10ML-% IV SOSY
PREFILLED_SYRINGE | INTRAVENOUS | Status: DC | PRN
  Administered 2017-02-01 (×2): 10 mg via INTRAVENOUS

## 2017-02-01 MED ORDER — PHENYLEPHRINE 40 MCG/ML (10ML) SYRINGE FOR IV PUSH (FOR BLOOD PRESSURE SUPPORT)
PREFILLED_SYRINGE | INTRAVENOUS | Status: DC | PRN
  Administered 2017-02-01 (×3): 80 ug via INTRAVENOUS

## 2017-02-01 MED ORDER — SODIUM CHLORIDE 0.9% FLUSH
3.0000 mL | Freq: Two times a day (BID) | INTRAVENOUS | Status: DC
  Administered 2017-02-01 – 2017-02-02 (×3): 3 mL via INTRAVENOUS

## 2017-02-01 MED ORDER — EPHEDRINE 5 MG/ML INJ
INTRAVENOUS | Status: AC
  Filled 2017-02-01: qty 10

## 2017-02-01 MED ORDER — BISACODYL 10 MG RE SUPP: 10.0000 mg | Freq: Every day | RECTAL | Status: DC | PRN

## 2017-02-01 MED ORDER — OXYCODONE HCL 5 MG PO TABS
5.0000 mg | ORAL_TABLET | ORAL | Status: DC | PRN
Start: 2017-02-01 — End: 2017-02-03
  Administered 2017-02-03: 5 mg via ORAL
  Filled 2017-02-01: qty 1

## 2017-02-01 MED ORDER — ACETAMINOPHEN 650 MG RE SUPP
650.0000 mg | RECTAL | Status: DC | PRN
Start: 2017-02-01 — End: 2017-02-03

## 2017-02-01 MED ORDER — PROMETHAZINE HCL 25 MG/ML IJ SOLN
25.0000 mg | Freq: Three times a day (TID) | INTRAMUSCULAR | Status: DC | PRN
  Administered 2017-02-01 – 2017-02-03 (×2): 25 mg via INTRAVENOUS
  Filled 2017-02-01 (×2): qty 1

## 2017-02-01 MED ORDER — PROPOFOL 10 MG/ML IV BOLUS
INTRAVENOUS | Status: DC | PRN
  Administered 2017-02-01: 100 mg via INTRAVENOUS

## 2017-02-01 MED ORDER — PROMETHAZINE HCL 25 MG/ML IJ SOLN: 6.2500 mg | INTRAMUSCULAR | Status: DC | PRN

## 2017-02-01 MED ORDER — ADULT MULTIVITAMIN W/MINERALS CH
1.0000 | ORAL_TABLET | Freq: Every day | ORAL | Status: DC
  Filled 2017-02-01: qty 1

## 2017-02-01 MED ORDER — THROMBIN (RECOMBINANT) 5000 UNITS EX SOLR
CUTANEOUS | Status: AC
  Filled 2017-02-01: qty 5000

## 2017-02-01 MED ORDER — SODIUM CHLORIDE 0.9 % IV SOLN: INTRAVENOUS | Status: DC

## 2017-02-01 MED ORDER — INSULIN ASPART 100 UNIT/ML ~~LOC~~ SOLN
SUBCUTANEOUS | Status: AC
  Filled 2017-02-01: qty 1

## 2017-02-01 MED ORDER — INSULIN PUMP
Freq: Three times a day (TID) | SUBCUTANEOUS | Status: DC
Start: 2017-02-01 — End: 2017-02-03
  Administered 2017-02-01: 3 via SUBCUTANEOUS
  Filled 2017-02-01: qty 1

## 2017-02-01 MED ORDER — MORPHINE SULFATE (PF) 2 MG/ML IV SOLN
2.0000 mg | INTRAVENOUS | Status: DC | PRN
  Administered 2017-02-01: 2 mg via INTRAVENOUS
  Filled 2017-02-01: qty 1

## 2017-02-01 MED ORDER — METHOCARBAMOL 1000 MG/10ML IJ SOLN
500.0000 mg | Freq: Four times a day (QID) | INTRAMUSCULAR | Status: DC | PRN
  Filled 2017-02-01: qty 5

## 2017-02-01 MED ORDER — INSULIN ASPART 100 UNIT/ML ~~LOC~~ SOLN
20.0000 [IU] | Freq: Once | SUBCUTANEOUS | Status: AC
  Administered 2017-02-01: 20 [IU] via SUBCUTANEOUS

## 2017-02-01 MED ORDER — FENTANYL CITRATE (PF) 250 MCG/5ML IJ SOLN
INTRAMUSCULAR | Status: AC
  Filled 2017-02-01: qty 5

## 2017-02-01 MED ORDER — BACITRACIN 50000 UNITS IM SOLR
INTRAMUSCULAR | Status: DC | PRN
  Administered 2017-02-01: 500 mL

## 2017-02-01 MED ORDER — LIDOCAINE-EPINEPHRINE 1 %-1:100000 IJ SOLN
INTRAMUSCULAR | Status: AC
  Filled 2017-02-01: qty 1

## 2017-02-01 MED ORDER — THROMBIN (RECOMBINANT) 5000 UNITS EX SOLR
CUTANEOUS | Status: DC | PRN
  Administered 2017-02-01 (×2): 5 mL via TOPICAL

## 2017-02-01 MED ORDER — "QUICK-SET INFUSION 43"" 9MM MISC": 1.0000 | Status: DC

## 2017-02-01 MED ORDER — BUSPIRONE HCL 10 MG PO TABS
30.0000 mg | ORAL_TABLET | Freq: Every day | ORAL | Status: DC
  Administered 2017-02-02: 30 mg via ORAL
  Filled 2017-02-01: qty 3

## 2017-02-01 MED ORDER — LIDOCAINE HCL 4 % MT SOLN
OROMUCOSAL | Status: DC | PRN
  Administered 2017-02-01: 4 mL via TOPICAL

## 2017-02-01 MED ORDER — SODIUM CHLORIDE 0.9 % IV SOLN: 250.0000 mL | INTRAVENOUS | Status: DC

## 2017-02-01 MED ORDER — FINASTERIDE 5 MG PO TABS
5.0000 mg | ORAL_TABLET | Freq: Every day | ORAL | Status: DC
  Filled 2017-02-01: qty 1

## 2017-02-01 MED ORDER — DOCUSATE SODIUM 100 MG PO CAPS
100.0000 mg | ORAL_CAPSULE | Freq: Two times a day (BID) | ORAL | Status: DC
  Administered 2017-02-02: 100 mg via ORAL
  Filled 2017-02-01 (×2): qty 1

## 2017-02-01 MED ORDER — SENNA 8.6 MG PO TABS
1.0000 | ORAL_TABLET | Freq: Two times a day (BID) | ORAL | Status: DC
  Administered 2017-02-02: 8.6 mg via ORAL
  Filled 2017-02-01 (×2): qty 1

## 2017-02-01 MED ORDER — FENTANYL CITRATE (PF) 100 MCG/2ML IJ SOLN
INTRAMUSCULAR | Status: DC | PRN
  Administered 2017-02-01: 100 ug via INTRAVENOUS
  Administered 2017-02-01: 50 ug via INTRAVENOUS

## 2017-02-01 MED ORDER — MENTHOL 3 MG MT LOZG: 1.0000 | LOZENGE | OROMUCOSAL | Status: DC | PRN

## 2017-02-01 MED ORDER — LIDOCAINE 2% (20 MG/ML) 5 ML SYRINGE
INTRAMUSCULAR | Status: DC | PRN
  Administered 2017-02-01: 80 mg via INTRAVENOUS

## 2017-02-01 MED ORDER — ONDANSETRON HCL 4 MG/2ML IJ SOLN
INTRAMUSCULAR | Status: DC | PRN
  Administered 2017-02-01: 4 mg via INTRAVENOUS

## 2017-02-01 MED ORDER — PHENOL 1.4 % MT LIQD
1.0000 | OROMUCOSAL | Status: DC | PRN
  Administered 2017-02-02: 1 via OROMUCOSAL
  Filled 2017-02-01: qty 177

## 2017-02-01 MED ORDER — INSULIN ASPART 100 UNIT/ML ~~LOC~~ SOLN: 0.0000 [IU] | Freq: Three times a day (TID) | SUBCUTANEOUS | Status: DC

## 2017-02-01 MED ORDER — ROCURONIUM BROMIDE 10 MG/ML (PF) SYRINGE
PREFILLED_SYRINGE | INTRAVENOUS | Status: AC
  Filled 2017-02-01: qty 5

## 2017-02-01 MED ORDER — SODIUM CHLORIDE 0.9% FLUSH: 3.0000 mL | INTRAVENOUS | Status: DC | PRN

## 2017-02-01 MED ORDER — PHENYLEPHRINE HCL 10 MG/ML IJ SOLN
INTRAVENOUS | Status: DC | PRN
  Administered 2017-02-01: 25 ug/min via INTRAVENOUS

## 2017-02-01 MED ORDER — MIDAZOLAM HCL 2 MG/2ML IJ SOLN
INTRAMUSCULAR | Status: AC
  Filled 2017-02-01: qty 2

## 2017-02-01 MED ORDER — CEFAZOLIN SODIUM-DEXTROSE 2-4 GM/100ML-% IV SOLN
2.0000 g | Freq: Three times a day (TID) | INTRAVENOUS | Status: AC
  Administered 2017-02-01 – 2017-02-02 (×2): 2 g via INTRAVENOUS
  Filled 2017-02-01 (×2): qty 100

## 2017-02-01 MED ORDER — PARADIGM PUMP RESERVOIR 3ML MISC: 1.0000 | Status: DC

## 2017-02-01 MED ORDER — BUPIVACAINE HCL 0.5 % IJ SOLN
INTRAMUSCULAR | Status: DC | PRN
  Administered 2017-02-01: 3.5 mL

## 2017-02-01 MED ORDER — HYDROMORPHONE HCL 1 MG/ML IJ SOLN
0.2500 mg | INTRAMUSCULAR | Status: DC | PRN
  Administered 2017-02-01 (×4): 0.5 mg via INTRAVENOUS

## 2017-02-01 MED ORDER — OXYCODONE HCL 5 MG PO TABS
10.0000 mg | ORAL_TABLET | ORAL | Status: DC | PRN
  Administered 2017-02-01 – 2017-02-03 (×8): 10 mg via ORAL
  Filled 2017-02-01 (×7): qty 2

## 2017-02-01 SURGICAL SUPPLY — 70 items
BAG DECANTER FOR FLEXI CONT (MISCELLANEOUS) ×2 IMPLANT
BASKET BONE COLLECTION (BASKET) ×2 IMPLANT
BENZOIN TINCTURE PRP APPL 2/3 (GAUZE/BANDAGES/DRESSINGS) IMPLANT
BLADE CLIPPER SURG (BLADE) IMPLANT
BLADE SURG 11 STRL SS (BLADE) ×2 IMPLANT
BLADE ULTRA TIP 2M (BLADE) IMPLANT
BUR MATCHSTICK NEURO 3.0 LAGG (BURR) ×2 IMPLANT
CAGE EXPANSE 8X6X6 (Cage) ×6 IMPLANT
CAGE PEEK 7X14X11 (Cage) ×3 IMPLANT
CAGE SPNL 11X14X7XRADOPQ (Cage) ×3 IMPLANT
CANISTER SUCT 3000ML PPV (MISCELLANEOUS) ×2 IMPLANT
CARTRIDGE OIL MAESTRO DRILL (MISCELLANEOUS) ×1 IMPLANT
DECANTER SPIKE VIAL GLASS SM (MISCELLANEOUS) ×2 IMPLANT
DERMABOND ADVANCED (GAUZE/BANDAGES/DRESSINGS) ×1
DERMABOND ADVANCED .7 DNX12 (GAUZE/BANDAGES/DRESSINGS) ×1 IMPLANT
DIFFUSER DRILL AIR PNEUMATIC (MISCELLANEOUS) ×2 IMPLANT
DRAIN CHANNEL 10M FLAT 3/4 FLT (DRAIN) IMPLANT
DRAPE C-ARM 42X72 X-RAY (DRAPES) ×4 IMPLANT
DRAPE HALF SHEET 40X57 (DRAPES) IMPLANT
DRAPE LAPAROTOMY 100X72 PEDS (DRAPES) ×2 IMPLANT
DRAPE MICROSCOPE LEICA (MISCELLANEOUS) ×2 IMPLANT
DRAPE POUCH INSTRU U-SHP 10X18 (DRAPES) ×2 IMPLANT
DRSG OPSITE POSTOP 3X4 (GAUZE/BANDAGES/DRESSINGS) ×2 IMPLANT
DRSG OPSITE POSTOP 4X6 (GAUZE/BANDAGES/DRESSINGS) ×2 IMPLANT
DURAPREP 6ML APPLICATOR 50/CS (WOUND CARE) ×2 IMPLANT
ELECT COATED BLADE 2.86 ST (ELECTRODE) ×2 IMPLANT
EVACUATOR SILICONE 100CC (DRAIN) IMPLANT
GAUZE SPONGE 4X4 16PLY XRAY LF (GAUZE/BANDAGES/DRESSINGS) ×2 IMPLANT
GLOVE BIO SURGEON STRL SZ7.5 (GLOVE) IMPLANT
GLOVE BIOGEL PI IND STRL 6.5 (GLOVE) ×1 IMPLANT
GLOVE BIOGEL PI IND STRL 7.5 (GLOVE) ×2 IMPLANT
GLOVE BIOGEL PI INDICATOR 6.5 (GLOVE) ×1
GLOVE BIOGEL PI INDICATOR 7.5 (GLOVE) ×2
GLOVE ECLIPSE 7.0 STRL STRAW (GLOVE) ×6 IMPLANT
GLOVE EXAM NITRILE LRG STRL (GLOVE) IMPLANT
GLOVE EXAM NITRILE XL STR (GLOVE) IMPLANT
GLOVE EXAM NITRILE XS STR PU (GLOVE) IMPLANT
GLOVE SURG SS PI 7.5 STRL IVOR (GLOVE) ×2 IMPLANT
GOWN STRL REUS W/ TWL LRG LVL3 (GOWN DISPOSABLE) ×2 IMPLANT
GOWN STRL REUS W/ TWL XL LVL3 (GOWN DISPOSABLE) IMPLANT
GOWN STRL REUS W/TWL 2XL LVL3 (GOWN DISPOSABLE) IMPLANT
GOWN STRL REUS W/TWL LRG LVL3 (GOWN DISPOSABLE) ×2
GOWN STRL REUS W/TWL XL LVL3 (GOWN DISPOSABLE)
HEMOSTAT POWDER KIT SURGIFOAM (HEMOSTASIS) ×4 IMPLANT
KIT BASIN OR (CUSTOM PROCEDURE TRAY) ×2 IMPLANT
KIT ROOM TURNOVER OR (KITS) ×2 IMPLANT
NEEDLE HYPO 25X1 1.5 SAFETY (NEEDLE) ×2 IMPLANT
NEEDLE SPNL 22GX3.5 QUINCKE BK (NEEDLE) ×2 IMPLANT
NS IRRIG 1000ML POUR BTL (IV SOLUTION) ×2 IMPLANT
OIL CARTRIDGE MAESTRO DRILL (MISCELLANEOUS) ×2
PACK LAMINECTOMY NEURO (CUSTOM PROCEDURE TRAY) ×2 IMPLANT
PAD ARMBOARD 7.5X6 YLW CONV (MISCELLANEOUS) ×6 IMPLANT
PATTIES SURGICAL 1X1 (DISPOSABLE) ×2 IMPLANT
PLATE 3 67.5XLCK NS SPNE CVD (Plate) ×1 IMPLANT
PLATE 3 ATLANTIS TRANS (Plate) ×1 IMPLANT
RUBBERBAND STERILE (MISCELLANEOUS) ×4 IMPLANT
SCREW 4.0X15MM (Screw) ×16 IMPLANT
SPONGE INTESTINAL PEANUT (DISPOSABLE) ×2 IMPLANT
SPONGE SURGIFOAM ABS GEL 100 (HEMOSTASIS) ×2 IMPLANT
STRIP CLOSURE SKIN 1/2X4 (GAUZE/BANDAGES/DRESSINGS) IMPLANT
SUT ETHILON 3 0 FSL (SUTURE) IMPLANT
SUT VIC AB 3-0 SH 8-18 (SUTURE) ×2 IMPLANT
SUT VICRYL 3-0 RB1 18 ABS (SUTURE) ×2 IMPLANT
TAPE CLOTH 3X10 TAN LF (GAUZE/BANDAGES/DRESSINGS) ×2 IMPLANT
TIP KERRISON THIN FOOTPLATE 1M (MISCELLANEOUS) ×2 IMPLANT
TIP KERRISON THIN FOOTPLATE 2M (MISCELLANEOUS) ×2 IMPLANT
TIP KERRISON THIN FOOTPLATE 3M (MISCELLANEOUS) ×2 IMPLANT
TOWEL GREEN STERILE (TOWEL DISPOSABLE) ×2 IMPLANT
TOWEL GREEN STERILE FF (TOWEL DISPOSABLE) ×2 IMPLANT
WATER STERILE IRR 1000ML POUR (IV SOLUTION) ×2 IMPLANT

## 2017-02-01 NOTE — Progress Notes (Signed)
Social Work  Discharge Note  The overall goal for the admission was met for:   Discharge location: No - d/c to acute for surgery  Length of Stay: No - transfer to acute for surgery  Discharge activity level: No  Home/community participation: No  Services provided included: MD, RD, PT, OT, RN, TR, Pharmacy, Neuropsych and SW  Financial Services: Private Insurance: [employer]  Follow-up services arranged: NA - transferred to acute.  No DME or follow up yet arranged.    Comments (or additional information):  Pt very motivated for CIR and hopeful to return.  Originally targeted goals set for supervision but these were not being met due to medical issues.  Wife very supportive and also very much wants pt to return to CIR.  Patient/Family verbalized understanding of follow-up arrangements: NA  Individual responsible for coordination of the follow-up plan: NA  Confirmed correct DME delivered: NA    Kenneth Pace, Kenneth Pace 

## 2017-02-01 NOTE — Anesthesia Preprocedure Evaluation (Addendum)
Anesthesia Evaluation  Patient identified by MRN, date of birth, ID band Patient awake    Reviewed: Allergy & Precautions, NPO status , Patient's Chart, lab work & pertinent test results  Airway Mallampati: II  TM Distance: >3 FB Neck ROM: Limited    Dental no notable dental hx. (+) Teeth Intact, Dental Advisory Given, Caps   Pulmonary neg pulmonary ROS,    Pulmonary exam normal breath sounds clear to auscultation       Cardiovascular hypertension, Normal cardiovascular exam Rhythm:Regular Rate:Normal     Neuro/Psych Demyelinating dz, along with mechanical myelopathy negative psych ROS   GI/Hepatic negative GI ROS, Neg liver ROS,   Endo/Other  diabetes, Insulin Dependent  Renal/GU negative Renal ROS  negative genitourinary   Musculoskeletal negative musculoskeletal ROS (+)   Abdominal   Peds negative pediatric ROS (+)  Hematology negative hematology ROS (+)   Anesthesia Other Findings   Reproductive/Obstetrics negative OB ROS                            Anesthesia Physical Anesthesia Plan  ASA: III  Anesthesia Plan: General   Post-op Pain Management:    Induction: Intravenous  PONV Risk Score and Plan: 2 and Ondansetron and Treatment may vary due to age or medical condition  Airway Management Planned: Oral ETT and Video Laryngoscope Planned  Additional Equipment:   Intra-op Plan:   Post-operative Plan: Extubation in OR  Informed Consent: I have reviewed the patients History and Physical, chart, labs and discussed the procedure including the risks, benefits and alternatives for the proposed anesthesia with the patient or authorized representative who has indicated his/her understanding and acceptance.   Dental advisory given  Plan Discussed with: CRNA and Surgeon  Anesthesia Plan Comments:         Anesthesia Quick Evaluation

## 2017-02-01 NOTE — Plan of Care (Signed)
Patient continue to be I&O cath

## 2017-02-01 NOTE — Op Note (Signed)
PREOP DIAGNOSIS:  1. Cervical stenosis with myelopathy, C3-4, C4-5, C5-6  POSTOP DIAGNOSIS: Same  PROCEDURE: 1. Discectomy at C3-4, C4-5, C5-6 for decompression of spinal cord and exiting nerve roots  2. Placement of intervertebral biomechanical device, Medtronic 63m PTC PEEK x3 3. Placement of anterior instrumentation consisting of interbody plate and screws spanning C3-C6 -Medtronic Atlantis 67.5 mm translational plate 4. Use of morselized bone allograft  5. Arthrodesis C3-4, C4-5, C5-6, anterior interbody technique  6. Use of intraoperative microscope  SURGEON: Dr. NConsuella Lose MD  ASSISTANT: VFerne Reus PA-C  ANESTHESIA: General Endotracheal  EBL: 250cc  SPECIMENS: None  DRAINS: None  COMPLICATIONS: None immediate  CONDITION: Stable to PACU  HISTORY: Kenneth GRANDISONis a 66y.o. man with a relatively complicated history of's progressive bilateral lower extremity greater than upper extremity weakness over the last few weeks.  He does have a history of neuropathy, and was presumptively diagnosed with a superimposed acute inflammatory demyelinating polyneuropathy.  He was initially treated with multiple rounds of IVIG, as well as a round of plasma exchange.  Unfortunately his symptoms continue to progress despite this.  He did undergo MRI of the cervical spine which did demonstrate degenerative moderately severe stenosis from C3-4 down to C5-6.  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  The risks and benefits of the surgery were explained in detail to the patient and his wife.  After all questions were answered informed consent was obtained and witnessed.  PROCEDURE IN DETAIL: The patient was brought to the operating room and transferred to the operative table. After induction of general anesthesia, the patient was  positioned on the operative table in the supine position with all pressure points meticulously padded. The skin of the neck was then prepped and draped in the usual sterile fashion.  After timeout was conducted, the skin was infiltrated with local anesthetic. Skin incision was then made sharply and Bovie electrocautery was used to dissect the subcutaneous tissue until the platysma was identified. The platysma was then divided and undermined. The sternocleidomastoid muscle was then identified and, utilizing natural fascial planes in the neck, the prevertebral fascia was identified and the carotid sheath was retracted laterally and the trachea and esophagus retracted medially. Again using fluoroscopy, the correct disc spaces were identified. Bovie electrocautery was used to dissect in the subperiosteal plane and elevate the bilateral longus coli muscles. Table mounted retractors were then placed. At this point, the microscope was draped and brought into the field, and the remainder of the case was done under the microscope using microdissecting technique.  The C3-4 disc space was incised sharply and rongeurs were use to initially complete a discectomy. The high-speed drill was then used to complete discectomy until the posterior annulus was identified and removed and the posterior longitudinal ligament was identified. Using a nerve hook, the PLL was elevated, and Kerrison rongeurs were used to remove the posterior longitudinal ligament and the ventral thecal sac was identified. Using a combination of curettes and rongeurs, complete decompression of the thecal sac and exiting nerve roots at this level was completed, and verified using micro-nerve hook.  At this point, a 7 mm interbody cage was sized and packed with morcellized bone allograft. This was then inserted and tapped into place.  Attention was then turned to the C4-5 level. In a similar fashion, discectomy was completed initially with curettes and  rongeurs, and  completed with the drill. The PLL was again identified, elevated and incised. Using Kerrison rongeurs, decompression of the spinal cord and exiting roots was completed and confirmed with a dissector.  A 7 mm interbody cage was then sized and filled with bone allograft, and tapped into place.   Attention was then turned to the C5-6 level. In a similar fashion, discectomy was completed initially with curettes and rongeurs, and completed with the drill. The PLL was again identified, elevated and incised. Using Kerrison rongeurs, decompression of the spinal cord and exiting roots was completed and confirmed with a dissector.  A 7 mm interbody cage was then sized and filled with bone allograft, and tapped into place. Position of the interbody devices was then confirmed with fluoroscopy. After placement of the intervertebral devices, the anterior cervical plate was selected, and placed across the interspaces. Using a high-speed drill, the cortex of the cervical vertebral bodies was punctured, and screws inserted in the C3, C4, C5, C6 levels. Final fluoroscopic images in lateral projection was taken to confirm good hardware placement.  At this point, after all counts were verified to be correct, meticulous hemostasis was secured using a combination of bipolar electrocautery and passive hemostatics. The platysma muscle was then closed using interrupted 3-0 Vicryl sutures, and the skin was closed with a interrupted subcuticular stitch. Sterile dressings were then applied and the drapes removed.  The patient tolerated the procedure well and was extubated in the room and taken to the postanesthesia care unit in stable condition.  At the end of the case all sponge, needle, instrument, and cottonoid counts were correct.

## 2017-02-01 NOTE — Anesthesia Procedure Notes (Signed)
Procedure Name: Intubation Date/Time: 02/01/2017 12:55 PM Performed by: Barrington Ellison, CRNA Pre-anesthesia Checklist: Patient identified, Emergency Drugs available, Suction available and Patient being monitored Patient Re-evaluated:Patient Re-evaluated prior to induction Oxygen Delivery Method: Circle System Utilized Preoxygenation: Pre-oxygenation with 100% oxygen Induction Type: IV induction Ventilation: Oral airway inserted - appropriate to patient size and Two handed mask ventilation required Laryngoscope Size: Glidescope and 4 Grade View: Grade I Tube type: Oral Number of attempts: 1 Airway Equipment and Method: Stylet and Oral airway Placement Confirmation: ETT inserted through vocal cords under direct vision,  positive ETCO2 and breath sounds checked- equal and bilateral Secured at: 23 cm Tube secured with: Tape Dental Injury: Teeth and Oropharynx as per pre-operative assessment  Difficulty Due To: Difficulty was anticipated and Difficult Airway-  due to neck instability

## 2017-02-01 NOTE — Progress Notes (Signed)
Called report to OR/PACU. Discharged to OR via bed. Kenneth Pace

## 2017-02-01 NOTE — Discharge Summary (Signed)
Physician Discharge Summary  Patient ID: TREYTEN MONESTIME MRN: [id number] DOB/AGE: December 31, 1950 66 y.o.  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 EXAM: CHEST  2 VIEW COMPARISON:  Chest x-ray and CT of the chest on 01/01/2017 FINDINGS: The heart size and mediastinal contours are within normal limits. Stable scarring in the lingula. There is no evidence of pulmonary edema, consolidation, pneumothorax, nodule or pleural fluid. The thoracic spine demonstrates degenerative disc disease. IMPRESSION: No active cardiopulmonary disease. Electronically Signed   By: Aletta Edouard M.D.   On: 01/25/2017 15:00    Labs:  Basic Metabolic Panel: BMP Latest Ref Rng & Units 02/01/2017 01/26/2017 01/25/2017  Glucose 65 - 99 mg/dL 171(H) 204(H) 143(H)  BUN 6 - 20 mg/dL 25(H) 18 17  Creatinine 0.61 - 1.24 mg/dL 0.82 0.75 0.79  Sodium 135 - 145 mmol/L 133(L) 133(L) 134(L)  Potassium 3.5 - 5.1 mmol/L 3.6 3.6 4.0  Chloride 101 - 111 mmol/L 101 101 102  CO2 22 - 32 mmol/L 24 26 26   Calcium 8.9 - 10.3 mg/dL 8.7(L) 8.5(L) 8.7(L)    CBC: CBC Latest Ref Rng & Units 01/26/2017 01/25/2017 01/24/2017  WBC 4.0 - 10.5 K/uL 2.9(L) 3.9(L) 3.5(L)  Hemoglobin 13.0 - 17.0 g/dL 14.1 15.0 14.1  Hematocrit 39.0 - 52.0 % 41.1 42.8 40.3  Platelets 150 - 400 K/uL 170 164 161    CBG: Recent Labs  Lab 01/31/17 1655 01/31/17 2133 01/31/17 2222 02/01/17 0208 02/01/17 0631  GLUCAP 76 55* 111* 156* 157*     Brief HPI:   Kenneth Haris Weatherlyis a 66 y.o.malewith history of T2DM, OSA,,subacute inflammatory polyradiculoneuropathy treated withIVIG for lower extremity weakness10/2018.  He was eadmitted on 12/29/16 withreports of progressive decline with reports of tightness around abdomen with difficulty breathing,progressive difficulty walking with numbness,difficulty swallowing and inability to void. Dr. 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  MRI of brain/cervical spinerepeated and was negative foracute intracranial abnormality but showed multilevel moderate stenosis C3/4 and C5/6 with mild cord deformity but no signal changes. Dr. Kathyrn Sheriff consulted and recommended trial of plasma exchange as symptoms favor AIDP and to consider decompression of cervical spondylosis if no improvement noted. He completed 5 rounds of treatment and was showing some improvement. Therapy ongoing and CIR was recommended due to functional deficits.    Hospital Course: POSEIDON PAM was admitted to rehab 01/11/2017 for inpatient therapies to consist of PT and OT at least three hours five days a week. Past admission physiatrist, therapy team and rehab RN have worked together to provide customized collaborative inpatient rehab. He has been afebrile during his stay and blood pressures have been stable. Diabetes has been monitored with ac/hs checks as well as his continuous dermal monitor. He has had some issues with hypo and hyperglycemia and has been adjusting insulin according to intake as well as activity.  PVRs were ordered to monitor voiding function and urecholine was added to help with bladder function.  He continues to require I/O caths due to ongoing issues with  urinary retention. Constipation has resolved with set up of bowel program.   He continues to have issues with neuropathic symptoms and gabapentin was titrated up to 600 mg tid without side effects. Mood has  been stable and he has shown good motivation with therapy.  As therapy progressed, he started developing decrease in activity tolerance with extreme fatigue  by the end of the day.  His wife expressed concerns about his recover as well as need for another round of IVIG.   Neurology was consulted for input and felt that it was reasonable for another trial but also expressed concerns that cervical stenosis may be contributing to his symptoms. He did not imporve with course of IVIG X 3 rounds therefore NS was consulted and recommended decompressive surgery.  He was discharged to acute hospital for surgery on 02/01/17.    Rehab course: During patient's stay in rehab weekly team conferences were held to monitor patient's progress, set goals and discuss barriers to discharge. At admission, patient required max assist for ADL tasks and mod assist for transfers. He has had some improvement in activity tolerance, balance, postural control, as well as ability to compensate for deficits.  Currently he requires min assist with increased time to complete ADL tasks. He is able to perform transfers with min assist and is able to ambulate 4' with RW and moderate assist.    Current medications:   . bethanechol  25 mg Oral TID  . Chlorhexidine Gluconate Cloth  6 each Topical Once  . enoxaparin (LOVENOX) injection  40 mg Subcutaneous Q24H  . finasteride  5 mg Oral Daily  . gabapentin  600 mg Oral TID  . insulin pump   Subcutaneous TID AC, HS, 0200  . lisinopril  2.5 mg Oral Daily  . multivitamin with minerals  1 tablet Oral Daily  . polyethylene glycol  17 g Oral BID  . protein supplement shake  11 oz Oral TID BM  . tamsulosin  0.4 mg Oral QPC supper  . vitamin B-12  1,000 mcg Oral Daily  . vitamin E  100 Units Oral Daily     Diet: Diabetic diet.   Special Instructions: 1. Continue to monitor PVRs and cath for volumes > 350 cc. 2. Monitor BS ac/hs.    Disposition:  Acute Hospital    Follow-up  Information    Jamse Arn, MD Follow up.   Specialty:  Physical Medicine and Rehabilitation Why:  office will call you with follow up appt Contact information: [address] [place] [address] [phone]        Alda Berthold, DO. Call.   Specialty:  Neurology Why:  for follow up appointment Contact information: [address] [place] [address] [phone]        Consuella Lose, MD Follow up.   Specialty:  Neurosurgery Contact information: [address] [place] [address] [place] [address] [phone]           Signed: Bary Leriche 02/01/2017, 9:07 AM

## 2017-02-01 NOTE — Plan of Care (Signed)
Not voiding on own, required I+O cath q 6 hours

## 2017-02-01 NOTE — Progress Notes (Signed)
[facility]     PROGRESS NOTE  Subjective/Complaints:  Pt seen laying in bed this AM.  He slept well overnight.  He notes he had a stable weekend, denies further regression.   ROS: Denies nausea, vomiting, diarrhea, shortness of breath or chest pain   Objective: Vital Signs: Blood pressure 133/74, pulse 74, temperature 97.6 F (36.4 C), temperature source Oral, resp. rate 18, height 6' (1.829 m), weight 98.6 kg (217 lb 6 oz), SpO2 97 %. No results found. No results for input(s): WBC, HGB, HCT, PLT in the last 72 hours. Recent Labs    02/01/17 0036  NA 133*  K 3.6  CL 101  GLUCOSE 171*  BUN 25*  CREATININE 0.82  CALCIUM 8.7*   CBG (last 3)  Recent Labs    01/31/17 2222 02/01/17 0208 02/01/17 0631  GLUCAP 111* 156* 157*    Wt Readings from Last 3 Encounters:  02/01/17 98.6 kg (217 lb 6 oz)  01/10/17 102.4 kg (225 lb 12 oz)  11/27/16 104.3 kg (230 lb)    Physical Exam:  BP 133/74 (BP Location: Left Arm)   Pulse 74   Temp 97.6 F (36.4 C) (Oral)   Resp 18   Ht 6' (1.829 m)   Wt 98.6 kg (217 lb 6 oz)   SpO2 97%   BMI 29.48 kg/m  Constitutional: He appears well-developed and well-nourished.  HENT: Normocephalic and atraumatic.  Eyes: EOM are normal. No discharge. Cardiovascular: RRR. No JVD    Respiratory: CTA Bilaterally. Normal effort   GI: Bowel sounds are normal. He exhibits no distension.   Musculoskeletal: He exhibits no edema or tenderness.  Neurological: He is alert and oriented.  Speech clear.  Cognition intact and able to follow commands without difficulty.  Motor: B/l UE: 4+/5 proximal to distal  B/l LE: HF 3-/5, KE 4-/5, 4/5 ADF/PF  Sensation to light touch diminished >> distal LE, now involving thorax Skin: Skin is warm and dry.  Psychiatric: He has a normal mood and affect. His behavior is normal. Judgment and thought content normal.   Assessment/Plan: 1. 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 Physiatrist is providing close team supervision and 24 hour management of active medical problems listed below. Physiatrist and rehab team continue to assess barriers to discharge/monitor patient progress toward functional and medical goals.  Function:  Bathing Bathing position   Position: Shower  Bathing parts Body parts bathed by patient: Right arm, Right upper leg, Left arm, Left upper leg, Abdomen, Chest, Front perineal area, Buttocks Body parts bathed by helper: Right lower leg, Left lower leg, Back  Bathing assist Assist Level: Touching or steadying assistance(Pt > 75%)      Upper Body Dressing/Undressing Upper body dressing   What is the patient wearing?: Pull over shirt/dress     Pull over shirt/dress - Perfomed by patient: Thread/unthread right sleeve, Thread/unthread left sleeve, Put head through opening, Pull shirt over trunk Pull over shirt/dress - Perfomed by helper: Pull shirt over trunk        Upper body assist Assist Level: More than reasonable time   Set up : To obtain clothing/put away  Lower Body Dressing/Undressing Lower body dressing   What is the patient wearing?: Pants, Non-skid slipper socks Underwear - Performed by patient: Thread/unthread right underwear leg, Thread/unthread left underwear leg, Pull underwear up/down Underwear - Performed by helper: Pull underwear up/down Pants-  Performed by patient: Thread/unthread right pants leg, Thread/unthread left pants leg, Pull pants up/down Pants- Performed by helper: Pull pants up/down Non-skid slipper socks- Performed by patient: Don/doff right sock, Don/doff left sock   Socks - Performed by patient: Don/doff right sock, Don/doff left sock(Sock aid) Socks - Performed by helper: Don/doff right sock, Don/doff left sock Shoes - Performed by patient: Don/doff right shoe, Don/doff left  shoe([place]) Shoes - Performed by helper: Don/doff right shoe, Don/doff left shoe          Lower body assist Assist for lower body dressing: Touching or steadying assistance (Pt > 75%)      Toileting Toileting   Toileting steps completed by patient: Adjust clothing prior to toileting, Adjust clothing after toileting Toileting steps completed by helper: Adjust clothing prior to toileting, Performs perineal hygiene, Adjust clothing after toileting Toileting Assistive Devices: Grab bar or rail  Toileting assist Assist level: Touching or steadying assistance (Pt.75%)   Transfers Chair/bed transfer   Chair/bed transfer method: Squat pivot Chair/bed transfer assist level: Touching or steadying assistance (Pt > 75%) Chair/bed transfer assistive device: Walker, Air cabin crew     Max distance: 4 Assist level: Moderate assist (Pt 50 - 74%)   Wheelchair   Type: Manual Max wheelchair distance: 150 Assist Level: Supervision or verbal cues  Cognition Comprehension Comprehension assist level: Follows complex conversation/direction with extra time/assistive device  Expression Expression assist level: Expresses basic needs/ideas: With no assist  Social Interaction Social Interaction assist level: Interacts appropriately 90% of the time - Needs monitoring or encouragement for participation or interaction.  Problem Solving Problem solving assist level: Solves basic problems with no assist  Memory Memory assist level: More than reasonable amount of time    Medical Problem List and Plan: 1.  Weakness, limitations with ADLs secondary to demyelinating myeloneuropathy, completed plasmapheresis and IVIG without substantial improvement.  Patient has cervical stenosis with symptomatic myelopathy as well   Cont CIR   Plan for cervical decompression today 2.  DVT Prophylaxis/Anticoagulation: Pharmaceutical: Lovenox 3. Pain Management:    Flexeril PRN for muscle spasms with  improvement   Gabapentin increased to 300 TID on 12/3, increased to 600 3 times a day on 12/5 - stable 4. Mood: Remains optimistic. LCSW to follow for evaluation and support.  5. Neuropsych: This patient is capable of making decisions on his own behalf. 6. Skin/Wound Care: Routine pressure relief measures. Maintain adequate nutritional and hydration status.  7. Fluids/Electrolytes/Nutrition: Monitor I/Os. Added nutritional supplement to help promote healing and for BS stabilization.  9.T2DM: Hgb A1c- 7.3 (managed by Dr. Loanne Drilling) Monitor BS ac/hs. Has insulin pump with monitor.      CBG (last 3)  Recent Labs    01/31/17 2222 02/01/17 0208 02/01/17 0631  GLUCAP 111* 156* 157*      Monitor with increased mobility   Remains labile with hypoglycemia yesterday 10. BPH/Neurogenic bladder: On finasteride in am. Added flomax   Now with retention.  Requiring in and out caths    Added Urecholine 25 mg 3 times daily 11. HTN: Monitor BP bid. Continue lisinopril Vitals:   01/31/17 2143 02/01/17 0411  BP: 138/77 133/74  Pulse:  74  Resp:  18  Temp:  97.6 F (36.4 C)  SpO2:  97%    Relatively controlled on 12/17 12. Anxiety disorder: Due to work stress and has been managed with buspar.  13. Constipation: Resolved with bowel regimen of miralax bid. 14. Hyponatremia   Na 133  on 12/17, stable   Cont to monitor  LOS (Days) 21 A FACE TO FACE EVALUATION WAS PERFORMED  Ralynn San Lorie Phenix 02/01/2017 10:26 AM

## 2017-02-01 NOTE — Progress Notes (Signed)
Received from PACU via bed; aspen collar placed on patient with log roll tech. With 2 RN's; patient oriented to room and unit routine; wife and family at bedside. Wife assisted patient to apply his insulin pump; orders clarified by pharmacy post op per MD instruction.

## 2017-02-01 NOTE — Progress Notes (Signed)
No issues overnight. Pt has no new complaints.  EXAM:  There were no vitals taken for this visit.  Awake, alert, oriented  Speech fluent, appropriate  CN grossly intact  Unchanged BLE>BUE weakness  IMPRESSION:  66 y.o. male with progressive myelopathy likely related at least in part to cervical stenosis.  PLAN: - Proceed as scheduled with C3-4, C4-5, C5-6 ACDF  I met with the patient, his wife, and son at bedside. All questions were answered and they are ready to proceed.

## 2017-02-01 NOTE — Progress Notes (Signed)
Patient arrived to pacu with CBG of 359. Dr Kalman Shan notified and 20units of SQ novolog administered per order at 1657. Will recheck.

## 2017-02-01 NOTE — Transfer of Care (Signed)
Immediate Anesthesia Transfer of Care Note  Patient: Kenneth Pace  Procedure(s) Performed: ANTERIOR CERVICAL DECOMPRESSION/DISCECTOMY FUSION CERVICAL THREE-FOUR , CERVICAL FOUR-FIVE  CERVICAL FIVE-SIX (N/A Neck)  Patient Location: PACU  Anesthesia Type:General  Level of Consciousness: awake  Airway & Oxygen Therapy: Patient Spontanous Breathing and Patient connected to nasal cannula oxygen  Post-op Assessment: Report given to RN  Post vital signs: Reviewed and stable  Last Vitals: There were no vitals filed for this visit.  Last Pain: There were no vitals filed for this visit.       Complications: No apparent anesthesia complications

## 2017-02-01 NOTE — Progress Notes (Signed)
Per shift report, patient to maintain foley until next day shift and also to be bed rest until PT/OT work with him in AM, he is having N/V at shift change gave IV Zofran will continue to monitor.

## 2017-02-02 LAB — GLUCOSE, CAPILLARY
GLUCOSE-CAPILLARY: 137 mg/dL — AB (ref 65–99)
Glucose-Capillary: 167 mg/dL — ABNORMAL HIGH (ref 65–99)
Glucose-Capillary: 76 mg/dL (ref 65–99)
Glucose-Capillary: 99 mg/dL (ref 65–99)
Glucose-Capillary: 97 mg/dL (ref 65–99)

## 2017-02-02 MED ORDER — LISINOPRIL 10 MG PO TABS
10.0000 mg | ORAL_TABLET | Freq: Every day | ORAL | Status: DC
  Administered 2017-02-02: 10 mg via ORAL
  Filled 2017-02-02: qty 1

## 2017-02-02 MED ORDER — ADULT MULTIVITAMIN W/MINERALS CH
1.0000 | ORAL_TABLET | Freq: Every day | ORAL | Status: DC
  Administered 2017-02-02: 1 via ORAL
  Filled 2017-02-02: qty 1

## 2017-02-02 MED ORDER — GABAPENTIN 600 MG PO TABS
600.0000 mg | ORAL_TABLET | Freq: Three times a day (TID) | ORAL | Status: DC
  Administered 2017-02-02 – 2017-02-03 (×5): 600 mg via ORAL
  Filled 2017-02-02 (×5): qty 1

## 2017-02-02 MED ORDER — FINASTERIDE 5 MG PO TABS
5.0000 mg | ORAL_TABLET | Freq: Every day | ORAL | Status: DC
  Administered 2017-02-02: 5 mg via ORAL
  Filled 2017-02-02: qty 1

## 2017-02-02 MED ORDER — HYDROCHLOROTHIAZIDE 12.5 MG PO CAPS
12.5000 mg | ORAL_CAPSULE | Freq: Every day | ORAL | Status: DC
  Administered 2017-02-02: 12.5 mg via ORAL
  Filled 2017-02-02: qty 1

## 2017-02-02 MED ORDER — VITAMIN B-12 1000 MCG PO TABS
1000.0000 ug | ORAL_TABLET | Freq: Every day | ORAL | Status: DC
  Administered 2017-02-02: 1000 ug via ORAL
  Filled 2017-02-02: qty 1

## 2017-02-02 MED ORDER — PANTOPRAZOLE SODIUM 40 MG PO TBEC
40.0000 mg | DELAYED_RELEASE_TABLET | Freq: Every day | ORAL | Status: DC
  Administered 2017-02-02: 40 mg via ORAL
  Filled 2017-02-02: qty 1

## 2017-02-02 MED FILL — Gelatin Absorbable MT Powder: OROMUCOSAL | Qty: 1 | Status: AC

## 2017-02-02 MED FILL — Thrombin (Recombinant) For Soln 5000 Unit: CUTANEOUS | Qty: 5000 | Status: AC

## 2017-02-02 NOTE — Progress Notes (Signed)
Inpatient Rehabilitation  Received notification from [facility] that case will need to be re-opened and we will need to await re-authorization.  Plan to update the team as I know.  Call if questions.   Carmelia Roller., CCC/SLP Admission Coordinator  [facility]  Cell [phone]

## 2017-02-02 NOTE — PMR Pre-admission (Signed)
PMR Admission Coordinator Pre-Admission Assessment  Patient: Kenneth Pace is an 66 y.o., male MRN: [id number] DOB: December 19, 1950 Height: 6' (182.9 cm) Weight: 103 kg (227 lb 1.2 oz)             Insurance Information HMO:     PPO: X     PCP:      IPA:      80/20:      OTHER: [employer]  PRIMARY: Conservation officer, nature for [employer]      Policy#: [id number]      Subscriber: Self CM Name: Santiago Glad      Phone#: [phone]     Fax#: [phone] Pre-Cert#: [id number] for 02/03/17-02/09/17      Employer: Full Time, [employer] Benefits:  Phone #: Verified online     Name: [email or web address] Eff. Date: 02/17/12     Deduct: $400      Out of Pocket Max: $2500      Life Max: N/A CIR: 85%/15%      SNF: 85%/15% Outpatient: PT/OT     Co-Pay: $40 per visit  Home Health: 85%      Co-Pay: 15% DME: 85%     Co-Pay: 15% Providers: In-network   SECONDARY: None      Policy#:       Subscriber:  CM Name:       Phone#:      Fax#:  Pre-Cert#:       Employer:  Benefits:  Phone #:      Name:  Eff. 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 [name] readmitted on 12/29/16  withreports of progressive decline that started during his trip with tightness around abdomen with difficulty breathing,progressive difficulty walking with numbness, difficulty swallowing and inability to void.Has been evaluated by neurology with evidence of demyelinating neuropathy on EMG.Dr. Rory Percy recommended evaluation for polyradiculoneuropathy v/s transverse myelitis--doubt GBS. MRI thoracic spine revealed multifocal disc protrusions with cord flattening T5-T10 without significant stenosis. Patient with tetraplegia and neuro felt exam due todemyelinatingmyeloneuropathy.   Work up underway to rule out paraneoplastic panel.LP done revealing elevated protein 121 and WBC - 7. MRI brain/cervical spinerepeated and was negative foracute intracranial abnormality and multilevel moderate stenosis C3/4 and C5/6 with mild cord deformity but no signal changes.CT abdomen/pelvis done to rule out paraneoplastic syndrome and was negative for acute abnormality. Incidental findings of fairly large amount of stool in colon, thickened bladder wall --favor chronic neurogenic bladder and enlarged prostate causing mass effect on bladder.   Dr. Kathyrn Sheriff consulted and recommended trial of plasma  exchange as symptoms favor AIDP but could have component of symptomatic cervical myelopathy. If he does not show improvement to consider decompression of cervical spondylosis.He completed 5 rounds of treatment and is showing some improvement.  Therapies recommending IP Rehab for post acute therapies and patient admitted 01/11/17.  Patient participated in [place] and during his stay weekly team conferences were held to monitor progress, set goals and discuss barriers to discharge. At admission, patient required max assist for ADL tasks and mod assist for transfers. He has had some improvement in activity tolerance, balance, postural control, as well as ability to compensate for deficits.  Bladder issues worsened with retention  requiring in and out caths.  Dr. Kathyrn Sheriff re-consulted with recommendation for surgery to address his cervical stenosis.  Patient transferred to surgery 12/17 for C3-5 ACDF. There were no post operative complications. Currently, pain well controlled, ambulating with Min A and requiring Mod A for transfers, tolerating PO, and voiding normal.  Therefore, he is being re-admitted 12/19 as an interrupted stay to complete his rehab course.        Past Medical History  Past Medical History:  Diagnosis Date  . ADENOIDECTOMY, HX OF 02/16/2007  . Anxiety    pt. states he does not have anxiety.  Marland Kitchen BENIGN PROSTATIC HYPERTROPHY, WITH OBSTRUCTION 02/03/2010  . DIABETES MELLITUS, TYPE I, UNCONTROLLED 12/29/2006  . ERECTILE DYSFUNCTION 02/16/2007  . HYPERLIPIDEMIA 02/16/2007  . HYPERTENSION 02/16/2007  . TESTICULAR MASS, LEFT 02/16/2007    Family History  family history includes Dementia in his mother; Diabetes Mellitus I in his mother; Healthy in his sister; Hypertension in his father; Rheum arthritis in his brother.  Prior Rehab/Hospitalizations:  Has the patient had major surgery during 100 days prior to admission? Yes  Current Medications   Current Facility-Administered Medications:  .  0.9 %  sodium chloride infusion, , Intravenous, Continuous, Nundkumar, Neelesh, MD .  0.9 %  sodium chloride infusion, 250 mL, Intravenous, Continuous, Consuella Lose, MD .  acetaminophen (TYLENOL) tablet 650 mg, 650 mg, Oral, Q4H PRN **OR** acetaminophen (TYLENOL) suppository 650 mg, 650 mg, Rectal, Q4H PRN, Consuella Lose, MD .  bisacodyl (DULCOLAX) suppository 10 mg, 10 mg, Rectal, Daily PRN, Consuella Lose, MD .  busPIRone (BUSPAR) tablet 30 mg, 30 mg, Oral, QHS, Consuella Lose, MD, Stopped at 02/01/17 2224 .  docusate sodium (COLACE) capsule 100 mg, 100 mg, Oral, BID, Consuella Lose, MD .  finasteride (PROSCAR) tablet 5 mg, 5 mg, Oral, QHS, Nundkumar, Neelesh, MD .  gabapentin  (NEURONTIN) tablet 600 mg, 600 mg, Oral, TID, Consuella Lose, MD, 600 mg at 02/02/17 1137 .  lisinopril (PRINIVIL,ZESTRIL) tablet 10 mg, 10 mg, Oral, QHS **AND** hydrochlorothiazide (MICROZIDE) capsule 12.5 mg, 12.5 mg, Oral, QHS, Nundkumar, Neelesh, MD .  insulin aspart (novoLOG) injection 0-15 Units, 0-15 Units, Subcutaneous, TID WC, Nundkumar, Neelesh, MD .  insulin pump, , Subcutaneous, TID AC, HS, 0200, Consuella Lose, MD, 3 each at 02/01/17 2225 .  ketorolac (TORADOL) 15 MG/ML injection 15 mg, 15 mg, Intravenous, Q6H, Consuella Lose, MD, 15 mg at 02/02/17 1137 .  menthol-cetylpyridinium (CEPACOL) lozenge 3 mg, 1 lozenge, Oral, PRN **OR** phenol (CHLORASEPTIC) mouth spray 1 spray, 1 spray, Mouth/Throat, PRN, Consuella Lose, MD, 1 spray at 02/02/17 0159 .  methocarbamol (ROBAXIN) tablet 500 mg, 500 mg, Oral, Q6H PRN **OR** methocarbamol (ROBAXIN) 500 mg in dextrose 5 % 50 mL IVPB, 500 mg, Intravenous, Q6H PRN, Consuella Lose, MD .  morphine 2 MG/ML injection 2 mg, 2 mg, Intravenous, Q2H PRN,  Consuella Lose, MD, 2 mg at 02/01/17 2224 .  multivitamin with minerals tablet 1 tablet, 1 tablet, Oral, QHS, Nundkumar, Neelesh, MD .  ondansetron (ZOFRAN) tablet 4 mg, 4 mg, Oral, Q6H PRN **OR** ondansetron (ZOFRAN) injection 4 mg, 4 mg, Intravenous, Q6H PRN, Consuella Lose, MD, 4 mg at 02/01/17 1918 .  oxyCODONE (Oxy IR/ROXICODONE) immediate release tablet 10 mg, 10 mg, Oral, Q3H PRN, Consuella Lose, MD, 10 mg at 02/02/17 1012 .  oxyCODONE (Oxy IR/ROXICODONE) immediate release tablet 5 mg, 5 mg, Oral, Q3H PRN, Consuella Lose, MD .  pantoprazole (PROTONIX) EC tablet 40 mg, 40 mg, Oral, QHS, Nundkumar, Neelesh, MD .  polyethylene glycol (MIRALAX / GLYCOLAX) packet 17 g, 17 g, Oral, Daily PRN, Consuella Lose, MD .  promethazine (PHENERGAN) injection 25 mg, 25 mg, Intravenous, Q8H PRN, Costella, Vincent J, PA-C, 25 mg at 02/01/17 2129 .  senna (SENOKOT) tablet 8.6 mg, 1  tablet, Oral, BID, Nundkumar, Neelesh, MD .  sodium chloride flush (NS) 0.9 % injection 3 mL, 3 mL, Intravenous, Q12H, Consuella Lose, MD, 3 mL at 02/02/17 1145 .  sodium chloride flush (NS) 0.9 % injection 3 mL, 3 mL, Intravenous, PRN, Consuella Lose, MD .  sodium phosphate (FLEET) 7-19 GM/118ML enema 1 enema, 1 enema, Rectal, Once PRN, Consuella Lose, MD .  vitamin B-12 (CYANOCOBALAMIN) tablet 1,000 mcg, 1,000 mcg, Oral, QHS, Consuella Lose, MD  Patients Current Diet: Diet Carb Modified Fluid consistency: Thin; Room service appropriate? Yes  Precautions / Restrictions Precautions Precautions: Fall, Cervical Precaution Comments: Cervical precautions; Aspen collar on when ambulating, up/OOB and sitting up in chair. May remove to eat, bathe or when in bed and resting per MD discussion with pt/spouse in pt room, OTR/L was present for this. 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 He was active enjoyed his dogs and rebuilding car engines.  Home Assistive Devices / Equipment Home Assistive Devices/Equipment: Wheelchair, CBG Meter, CPAP, Blood pressure cuff Home Equipment: Cane - single point, Crutches, Walker - 2 wheels, Walker - 4 wheels  Prior Device Use: Indicate devices/aids used by the patient prior to current illness, exacerbation or injury? Manual wheelchair, Games developer  Prior Functional Level Prior Function Level of Independence: Needs assistance Gait / Transfers Assistance Needed: Pt coming from CIR, and last PT session was ambulating only 4-ft with mod assist and RW.  ADL's / Homemaking Assistance Needed: Has been sponge bathing with assist  from his wife. Reports increasing difficulty with dressing.  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 Independent  Indoor Mobility: Did the patient need assistance with walking from room to room (with or without device)? Independent  Stairs: Did the patient need assistance with internal or external stairs (with or without device)? Independent  Functional Cognition: Did the patient need help planning regular tasks such as shopping or remembering to take medications? Independent  Current Functional Level Cognition  Overall Cognitive Status: Within Functional Limits for tasks assessed Orientation Level: Oriented X4    Extremity Assessment (includes Sensation/Coordination)  Upper Extremity Assessment: Defer to OT evaluation RUE  Deficits / Details: Overall bilateral decreased coordination secondary to numbness, edema. No MMT was performed at this time secondary to ACDF C3-5; cervical precaitions and Aspen collar. Limitations noted with purposeful release. Decreased coordination overall. Decreased grip, decreased grasp/release noted bilaterally.  Lower Extremity Assessment: RLE deficits/detail, LLE deficits/detail RLE Deficits / Details: ankle DF 3/5, knee extension 3/5, sustained clonus LLE Deficits / Details: ankle DF 3/5, knee extension 3/5, sustained clonus    ADLs  Overall ADL's : Needs assistance/impaired Eating/Feeding: Supervision/ safety, Set up, Sitting Eating/Feeding Details (indicate cue type and reason): with built-up handles Grooming: Wash/dry hands, Oral care, Moderate assistance, Standing, Cueing for safety Grooming Details (indicate cue type and reason): Grooming standing at sink. Leaning on sink to steady, difficulty opening toothpaste and to squeeze toothpaste container Upper Body Bathing: Moderate assistance, Sitting, Cueing for safety Lower Body  Bathing: Maximal assistance, Sit to/from stand Upper Body Dressing : Moderate assistance, Sitting, Cueing for safety Upper Body Dressing Details (indicate cue type and reason): Safety cues secondary to cervical precautions Lower Body Dressing: Maximal assistance, Cueing for safety, Sit to/from stand Toilet Transfer: Moderate assistance, +2 for safety/equipment, RW, Ambulation(Simulated transfer from recliner chair back to bed after grooming standing at sink) Toilet Transfer Details (indicate cue type and reason): Pt seen +1, however recommend +2 for safety with functional mobility and ambulation. Spouse and NT present off and on during asssessment today. Noted compensatory movements to achieve stability on his feet.  Toileting- Clothing Manipulation and Hygiene: Sit to/from stand, +2 for safety/equipment, Maximal assistance, Cueing for safety Functional mobility during ADLs: Minimal assistance, Moderate assistance, +2 for safety/equipment, Cueing for safety, Cueing for sequencing, Rolling walker General ADL Comments: Pt benefits from +2 during ADL's for safety at this time. Pt reports increased independence with functional mobility/transfers as compared to prior to ACDF C3-5.  Pt is highly motivated to work with therapies and hopes to return to CIR/In-pt Rehab when medically able.    Mobility  Overal bed mobility: Needs Assistance Bed Mobility: Rolling Rolling: Min guard Sidelying to sit: Min guard, HOB elevated Sit to sidelying: Mod assist(Mod A for safety/sequencing and cervical precautions) General bed mobility comments: increased time and use of bed railing    Transfers  Overall transfer level: Needs assistance Equipment used: Rolling walker (2 wheeled) Transfers: Sit to/from Stand Sit to Stand: Mod assist General transfer comment: cues for hand placement. STS from EOB x1. Pt in chair post-ambulation.     Ambulation / Gait / Stairs / Wheelchair Mobility  Ambulation/Gait Ambulation/Gait  assistance: Museum/gallery curator (Feet): 80 Feet Assistive device: Rolling walker (2 wheeled) Gait Pattern/deviations: Step-through pattern, Decreased stride length General Gait Details: Pt demonstrates gait with knee instability followed by hyperextension thrust bilaterally. Pt uses trunk and momentum to propel legs forward.  Gait velocity: decreased    Posture / Balance Dynamic Sitting Balance Sitting balance - Comments: Pt able to sit EOB without UE support.  Balance Overall balance assessment: Needs assistance Sitting-balance support: Feet unsupported, Bilateral upper extremity supported Sitting balance-Leahy Scale: Fair Sitting balance - Comments: Pt able to sit EOB without UE support.  Standing balance support: Bilateral upper extremity supported, Single extremity supported Standing balance-Leahy Scale: Poor Standing balance comment: Pt reliant on UEs on RW for standing balance.     Special needs/care consideration BiPAP/CPAP: Yes, CPAP CPM: No Continuous Drip IV: No Dialysis: No        Life Vest: No Oxygen: No Special Bed: No Trach Size: No Wound Vac (area): No  Skin: Dry, Abrasions to bilateral legs and toes                               Bowel mgmt: Continent, last BM 01/10/17 Bladder mgmt: Acute urinary retention with foley catheter placed initially, voiding trials, and now continent  Diabetic mgmt: Yes, managed with an insulin pump prior to admission      Previous Home Environment Living Arrangements: Spouse/significant other  Lives With: Spouse Available Help at Discharge: Family, Available 24 hours/day Type of Home: House Home Layout: Two level, Bed/bath upstairs, Able to live on main level with bedroom/bathroom Alternate Level Stairs-Rails: Can reach both Alternate Level Stairs-Number of Steps: 17 Home Access: Stairs to enter Entrance Stairs-Rails: Can reach both, Left, Right Entrance Stairs-Number of Steps: 2 Bathroom Shower/Tub: Tub/shower  unit, Industrial/product designer: Yes Home Care Services: No Additional Comments: Has been using RW PTA over the past couple of months  Discharge Living Setting Plans for Discharge Living Setting: Patient's home, Lives with (comment)(Spouse) Type of Home at Discharge: House Discharge Home Layout: Two level, Able to live on main level with bedroom/bathroom Alternate Level Stairs-Rails: Can reach both Alternate Level Stairs-Number of Steps: 17 Discharge Home Access: Stairs to enter Entrance Stairs-Rails: None Entrance Stairs-Number of Steps: 3 Discharge Bathroom Shower/Tub: Tub/shower unit, Curtain Discharge Bathroom Toilet: Standard Discharge Bathroom Accessibility: Yes How Accessible: Accessible via walker Does the patient have any problems obtaining your medications?: No  Social/Family/Support Systems Patient Roles: Spouse, Parent Contact Information: Spouse Lexicographer  Anticipated Caregiver: Spouse, Manufacturing systems engineer Information: [phone] Ability/Limitations of Caregiver: Spouse works from home  Caregiver Availability: 24/7 Discharge Plan Discussed with Primary Caregiver: Yes Is Caregiver In Agreement with Plan?: Yes Does Caregiver/Family have Issues with Lodging/Transportation while Pt is in Rehab?: No  Goals/Additional Needs Patient/Family Goal for Rehab: PT/OT: Mod I -Supervision  Expected length of stay: 11-17 days  Cultural Considerations: None Dietary Needs: Heart Healthy/Carb Mod. diet restrictions  Equipment Needs: TBD Special Service Needs: Pt with insulin pump Pt/Family Agrees to Admission and willing to participate: Yes Program Orientation Provided & Reviewed with Pt/Caregiver Including Roles  & Responsibilities: Yes Additional Information Needs: None  Decrease burden of Care through IP rehab admission: No  Possible need for SNF placement upon discharge: No  Patient Condition: This patient's  medical and functional status has changed since the consult dated: 01/01/17 in which the Rehabilitation Physician determined and documented that the patient's condition is appropriate for intensive rehabilitative care in an inpatient rehabilitation facility. See "History of Present Illness" (above) for medical update. Functional changes are: Mod A transfers and Min A gait 80 feet. Patient's medical and functional status update has been discussed with the Rehabilitation physician and patient remains appropriate for inpatient rehabilitation. Will re-admit to inpatient rehab today as an interrupted stay.  Preadmission Screen Completed By:  Gunnar Fusi, 02/02/2017 1:25 PM ______________________________________________________________________   Discussed status with Dr. Posey Pronto on 02/03/17 at 82 and received telephone approval for admission today.  Admission Coordinator:  Gunnar Fusi, time 1210/Date 02/03/17

## 2017-02-02 NOTE — Progress Notes (Signed)
Inpatient Diabetes Program Recommendations  AACE/ADA: New Consensus Statement on Inpatient Glycemic Control (2015)  Target Ranges:  Prepandial:   less than 140 mg/dL      Peak postprandial:   less than 180 mg/dL (1-2 hours)      Critically ill patients:  140 - 180 mg/dL   Inpatient Diabetes Program Recommendations  AACE/ADA: New Consensus Statement on Inpatient Glycemic Control (2015)  Target Ranges:  Prepandial:   less than 140 mg/dL      Peak postprandial:   less than 180 mg/dL (1-2 hours)      Critically ill patients:  140 - 180 mg/dL   Results for Kenneth Pace, Kenneth Pace (MRN [id number]) as of 02/02/2017 13:45  Ref. Range 02/02/2017 01:58 02/02/2017 06:37 02/02/2017 11:17  Glucose-Capillary Latest Ref Range: 65 - 99 mg/dL 167 (H) 76 137 (H)    Home DM Meds: Insulin Pump  Current Insulin Orders: Insulin Pump     Met with patient today at bedside.  Pt A&O and able to independently manage insulin pump.  Extra pump supplies available at bedside.  Patient stated he may restart his continuous glucose sensor later today.  Awaiting transfer to [facility].  Per records, pt came off his insulin pump yesterday for surgery.  Restarted the pump last PM around 6pm with the assistance of his wife.  See below for pump settings (per patient statement- No changes have been made to pump since initial admission):   --Insulin Pump Settings--  Basal Rates: MN to 6am- 2.75 units/hr 6am-MN- 3.0 units/hr  Total Basal Insulin per 24 hours period= 70.5 units  Carbohydrate Ratio: 1 unit for every 12-15 Grams of Carbohydrates  Correction/Sensitivity Factor: 1 unit for every 50 mg/dl above Target CBG  Target CBG: 100 mg/dl      --Will follow patient during hospitalization--  Wyn Quaker RN, MSN, CDE Diabetes Coordinator Inpatient Glycemic Control Team Team Pager: [phone] (8a-5p)

## 2017-02-02 NOTE — Anesthesia Postprocedure Evaluation (Signed)
Anesthesia Post Note  Patient: RYLIN SEAVEY  Procedure(s) Performed: ANTERIOR CERVICAL DECOMPRESSION/DISCECTOMY FUSION CERVICAL THREE-FOUR , CERVICAL FOUR-FIVE  CERVICAL FIVE-SIX (N/A Neck)     Patient location during evaluation: PACU Anesthesia Type: General Level of consciousness: awake and alert Pain management: pain level controlled Vital Signs Assessment: post-procedure vital signs reviewed and stable Respiratory status: spontaneous breathing, nonlabored ventilation, respiratory function stable and patient connected to nasal cannula oxygen Cardiovascular status: blood pressure returned to baseline and stable Postop Assessment: no apparent nausea or vomiting Anesthetic complications: no    Last Vitals:  Vitals:   02/02/17 0108 02/02/17 0523  BP: (!) 144/88 (!) 159/84  Pulse: 98 98  Resp: 18 18  Temp: 36.4 C 36.6 C  SpO2: 100% 100%    Last Pain:  Vitals:   02/02/17 0523  TempSrc: Oral  PainSc:                  Yakir Wenke S
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
## 2017-02-02 NOTE — Progress Notes (Addendum)
Patient N/V has subsided he is still having surgical pain will give PO pin medication to see how he tolerates it. Patient also has insulin pump that was put back on by his wife which I have been checking his blood sugar at 2200 0200 and later at 0630.

## 2017-02-02 NOTE — Progress Notes (Signed)
No issues overnight. Pt reports appropriate neck pain. Swallowing well. Does report subjective improvement in BLE strength, able to walk further than preop.  EXAM:  BP 108/61 (BP Location: Left Arm)   Pulse (!) 104   Temp 97.9 F (36.6 C) (Oral)   Resp 20   Ht 6' (1.829 m)   Wt 103 kg (227 lb 1.2 oz)   SpO2 94%   BMI 30.80 kg/m   Awake, alert, oriented  Speech fluent, appropriate  CN grossly intact  4-/5 BLE, 4/5 BUE Wound c/d/i   IMPRESSION:  66 y.o. male POD#1 C3-5 ACDF, doing well. Stable for transfer back to CIR  PLAN: - d/c foley, ISC PRN - Neurontin 600mg  TID - Transfer to CIR when insurance approved/bed available

## 2017-02-02 NOTE — Progress Notes (Signed)
Inpatient Rehabilitation  Spoke with Dr. Kathyrn Sheriff regarding patient's medical readiness for resumption of IP Rehab program.  Consult order placed and [facility] contacted for reauthorization.  Plan to follow up with the team as I know.  Call if questions.   Carmelia Roller., CCC/SLP Admission Coordinator  [place]  Cell [phone]
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
## 2017-02-03 ENCOUNTER — Other Ambulatory Visit: Payer: Self-pay

## 2017-02-03 ENCOUNTER — Inpatient Hospital Stay (HOSPITAL_COMMUNITY)
Admission: RE | Admit: 2017-02-03 | Discharge: 2017-02-12 | DRG: 052 | Disposition: A | Discharge status: Home / Self Care | Payer: BLUE CROSS/BLUE SHIELD | Source: Intra-hospital | Attending: Physical Medicine & Rehabilitation | Admitting: Physical Medicine & Rehabilitation

## 2017-02-03 ENCOUNTER — Encounter (HOSPITAL_COMMUNITY): Payer: Self-pay | Admitting: Neurosurgery

## 2017-02-03 DIAGNOSIS — N401 Enlarged prostate with lower urinary tract symptoms: Secondary | ICD-10-CM

## 2017-02-03 DIAGNOSIS — E876 Hypokalemia: Secondary | ICD-10-CM | POA: Diagnosis not present

## 2017-02-03 DIAGNOSIS — E871 Hypo-osmolality and hyponatremia: Secondary | ICD-10-CM | POA: Diagnosis present

## 2017-02-03 DIAGNOSIS — F419 Anxiety disorder, unspecified: Secondary | ICD-10-CM | POA: Diagnosis present

## 2017-02-03 DIAGNOSIS — G9349 Other encephalopathy: Secondary | ICD-10-CM | POA: Diagnosis not present

## 2017-02-03 DIAGNOSIS — N319 Neuromuscular dysfunction of bladder, unspecified: Secondary | ICD-10-CM | POA: Diagnosis present

## 2017-02-03 DIAGNOSIS — R1310 Dysphagia, unspecified: Secondary | ICD-10-CM | POA: Diagnosis present

## 2017-02-03 DIAGNOSIS — R339 Retention of urine, unspecified: Secondary | ICD-10-CM | POA: Diagnosis present

## 2017-02-03 DIAGNOSIS — G8918 Other acute postprocedural pain: Secondary | ICD-10-CM

## 2017-02-03 DIAGNOSIS — G959 Disease of spinal cord, unspecified: Secondary | ICD-10-CM

## 2017-02-03 DIAGNOSIS — R739 Hyperglycemia, unspecified: Secondary | ICD-10-CM | POA: Diagnosis not present

## 2017-02-03 DIAGNOSIS — M62838 Other muscle spasm: Secondary | ICD-10-CM | POA: Diagnosis present

## 2017-02-03 DIAGNOSIS — D709 Neutropenia, unspecified: Secondary | ICD-10-CM | POA: Diagnosis present

## 2017-02-03 DIAGNOSIS — T50901A Poisoning by unspecified drugs, medicaments and biological substances, accidental (unintentional), initial encounter: Secondary | ICD-10-CM

## 2017-02-03 DIAGNOSIS — T380X5A Adverse effect of glucocorticoids and synthetic analogues, initial encounter: Secondary | ICD-10-CM | POA: Diagnosis not present

## 2017-02-03 DIAGNOSIS — G610 Guillain-Barre syndrome: Secondary | ICD-10-CM | POA: Diagnosis not present

## 2017-02-03 DIAGNOSIS — E860 Dehydration: Secondary | ICD-10-CM | POA: Diagnosis not present

## 2017-02-03 DIAGNOSIS — R7309 Other abnormal glucose: Secondary | ICD-10-CM | POA: Diagnosis not present

## 2017-02-03 DIAGNOSIS — E109 Type 1 diabetes mellitus without complications: Secondary | ICD-10-CM | POA: Diagnosis not present

## 2017-02-03 DIAGNOSIS — I10 Essential (primary) hypertension: Secondary | ICD-10-CM | POA: Diagnosis present

## 2017-02-03 DIAGNOSIS — E119 Type 2 diabetes mellitus without complications: Secondary | ICD-10-CM | POA: Diagnosis not present

## 2017-02-03 DIAGNOSIS — Z9641 Presence of insulin pump (external) (internal): Secondary | ICD-10-CM | POA: Diagnosis present

## 2017-02-03 DIAGNOSIS — K5901 Slow transit constipation: Secondary | ICD-10-CM | POA: Diagnosis not present

## 2017-02-03 DIAGNOSIS — M4802 Spinal stenosis, cervical region: Secondary | ICD-10-CM | POA: Diagnosis not present

## 2017-02-03 DIAGNOSIS — G992 Myelopathy in diseases classified elsewhere: Secondary | ICD-10-CM | POA: Diagnosis present

## 2017-02-03 DIAGNOSIS — E1165 Type 2 diabetes mellitus with hyperglycemia: Secondary | ICD-10-CM | POA: Diagnosis not present

## 2017-02-03 DIAGNOSIS — R338 Other retention of urine: Secondary | ICD-10-CM | POA: Diagnosis not present

## 2017-02-03 DIAGNOSIS — G378 Other specified demyelinating diseases of central nervous system: Secondary | ICD-10-CM | POA: Diagnosis not present

## 2017-02-03 DIAGNOSIS — M792 Neuralgia and neuritis, unspecified: Secondary | ICD-10-CM

## 2017-02-03 DIAGNOSIS — T40601A Poisoning by unspecified narcotics, accidental (unintentional), initial encounter: Secondary | ICD-10-CM | POA: Diagnosis not present

## 2017-02-03 DIAGNOSIS — E1169 Type 2 diabetes mellitus with other specified complication: Secondary | ICD-10-CM | POA: Diagnosis not present

## 2017-02-03 DIAGNOSIS — G4733 Obstructive sleep apnea (adult) (pediatric): Secondary | ICD-10-CM | POA: Diagnosis not present

## 2017-02-03 DIAGNOSIS — G8250 Quadriplegia, unspecified: Principal | ICD-10-CM | POA: Diagnosis present

## 2017-02-03 DIAGNOSIS — Z981 Arthrodesis status: Secondary | ICD-10-CM

## 2017-02-03 LAB — GLUCOSE, CAPILLARY
GLUCOSE-CAPILLARY: 37 mg/dL — AB (ref 65–99)
GLUCOSE-CAPILLARY: 39 mg/dL — AB (ref 65–99)
GLUCOSE-CAPILLARY: 183 mg/dL — AB (ref 65–99)
GLUCOSE-CAPILLARY: 138 mg/dL — AB (ref 65–99)
GLUCOSE-CAPILLARY: 67 mg/dL (ref 65–99)
Glucose-Capillary: 184 mg/dL — ABNORMAL HIGH (ref 65–99)
Glucose-Capillary: 150 mg/dL — ABNORMAL HIGH (ref 65–99)

## 2017-02-03 MED ORDER — OXYCODONE HCL 5 MG PO TABS
5.0000 mg | ORAL_TABLET | ORAL | Status: DC | PRN
  Administered 2017-02-03: 5 mg via ORAL
  Filled 2017-02-03: qty 1

## 2017-02-03 MED ORDER — ACETAMINOPHEN 325 MG PO TABS
325.0000 mg | ORAL_TABLET | ORAL | Status: DC | PRN
  Administered 2017-02-04 – 2017-02-12 (×5): 650 mg via ORAL
  Filled 2017-02-03 (×5): qty 2

## 2017-02-03 MED ORDER — DEXTROSE 50 % IV SOLN
25.0000 mL | Freq: Once | INTRAVENOUS | Status: AC
  Administered 2017-02-03: 25 mL via INTRAVENOUS

## 2017-02-03 MED ORDER — VITAMIN B-12 1000 MCG PO TABS
1000.0000 ug | ORAL_TABLET | Freq: Every day | ORAL | Status: DC
  Administered 2017-02-03 – 2017-02-11 (×9): 1000 ug via ORAL
  Filled 2017-02-03 (×9): qty 1

## 2017-02-03 MED ORDER — DOCUSATE SODIUM 100 MG PO CAPS
100.0000 mg | ORAL_CAPSULE | Freq: Two times a day (BID) | ORAL | Status: DC
  Administered 2017-02-03 – 2017-02-10 (×15): 100 mg via ORAL
  Filled 2017-02-03 (×17): qty 1

## 2017-02-03 MED ORDER — PROCHLORPERAZINE 25 MG RE SUPP: 12.5000 mg | Freq: Four times a day (QID) | RECTAL | Status: DC | PRN

## 2017-02-03 MED ORDER — BISACODYL 10 MG RE SUPP: 10.0000 mg | Freq: Every day | RECTAL | Status: DC | PRN

## 2017-02-03 MED ORDER — INSULIN PUMP
Freq: Three times a day (TID) | SUBCUTANEOUS | Status: DC
  Administered 2017-02-04: 9 via SUBCUTANEOUS
  Administered 2017-02-04: 3 via SUBCUTANEOUS
  Administered 2017-02-05 – 2017-02-08 (×2): via SUBCUTANEOUS
  Administered 2017-02-09: 4 via SUBCUTANEOUS
  Administered 2017-02-09: 13:00:00 via SUBCUTANEOUS
  Administered 2017-02-09: 8.5 via SUBCUTANEOUS
  Administered 2017-02-09 – 2017-02-10 (×2): via SUBCUTANEOUS
  Administered 2017-02-10: 4 via SUBCUTANEOUS
  Administered 2017-02-11: 22:00:00 via SUBCUTANEOUS
  Administered 2017-02-11: 4 via SUBCUTANEOUS
  Filled 2017-02-03: qty 1

## 2017-02-03 MED ORDER — GABAPENTIN 600 MG PO TABS: 600.0000 mg | ORAL_TABLET | Freq: Three times a day (TID) | ORAL | 2 refills | Status: DC

## 2017-02-03 MED ORDER — OXYCODONE HCL 5 MG PO TABS
10.0000 mg | ORAL_TABLET | ORAL | Status: DC | PRN
  Administered 2017-02-04 – 2017-02-05 (×5): 10 mg via ORAL
  Filled 2017-02-03 (×5): qty 2

## 2017-02-03 MED ORDER — TRAZODONE HCL 50 MG PO TABS: 25.0000 mg | ORAL_TABLET | Freq: Every evening | ORAL | Status: DC | PRN

## 2017-02-03 MED ORDER — POLYETHYLENE GLYCOL 3350 17 G PO PACK
17.0000 g | PACK | Freq: Two times a day (BID) | ORAL | Status: DC
  Administered 2017-02-03 – 2017-02-09 (×6): 17 g via ORAL
  Filled 2017-02-03 (×13): qty 1

## 2017-02-03 MED ORDER — LISINOPRIL 10 MG PO TABS
10.0000 mg | ORAL_TABLET | Freq: Every day | ORAL | Status: DC
  Administered 2017-02-03 – 2017-02-11 (×9): 10 mg via ORAL
  Filled 2017-02-03 (×9): qty 1

## 2017-02-03 MED ORDER — HYDROCHLOROTHIAZIDE 12.5 MG PO CAPS
12.5000 mg | ORAL_CAPSULE | Freq: Every day | ORAL | Status: DC
  Administered 2017-02-03 – 2017-02-11 (×9): 12.5 mg via ORAL
  Filled 2017-02-03 (×9): qty 1

## 2017-02-03 MED ORDER — DIPHENHYDRAMINE HCL 12.5 MG/5ML PO ELIX: 12.5000 mg | ORAL_SOLUTION | Freq: Four times a day (QID) | ORAL | Status: DC | PRN

## 2017-02-03 MED ORDER — ALUM & MAG HYDROXIDE-SIMETH 200-200-20 MG/5ML PO SUSP
30.0000 mL | ORAL | Status: DC | PRN
  Administered 2017-02-07: 30 mL via ORAL

## 2017-02-03 MED ORDER — GUAIFENESIN-DM 100-10 MG/5ML PO SYRP: 5.0000 mL | ORAL_SOLUTION | Freq: Four times a day (QID) | ORAL | Status: DC | PRN

## 2017-02-03 MED ORDER — OXYCODONE-ACETAMINOPHEN 7.5-325 MG PO TABS: 1.0000 | ORAL_TABLET | ORAL | 0 refills | Status: DC | PRN

## 2017-02-03 MED ORDER — GABAPENTIN 600 MG PO TABS
600.0000 mg | ORAL_TABLET | Freq: Three times a day (TID) | ORAL | Status: DC
  Administered 2017-02-03 – 2017-02-10 (×19): 600 mg via ORAL
  Filled 2017-02-03 (×20): qty 1

## 2017-02-03 MED ORDER — FLEET ENEMA 7-19 GM/118ML RE ENEM: 1.0000 | ENEMA | Freq: Once | RECTAL | Status: DC | PRN

## 2017-02-03 MED ORDER — METHOCARBAMOL 500 MG PO TABS
500.0000 mg | ORAL_TABLET | Freq: Four times a day (QID) | ORAL | Status: DC | PRN
  Administered 2017-02-03 – 2017-02-04 (×2): 500 mg via ORAL
  Filled 2017-02-03 (×2): qty 1

## 2017-02-03 MED ORDER — PROCHLORPERAZINE EDISYLATE 5 MG/ML IJ SOLN: 5.0000 mg | Freq: Four times a day (QID) | INTRAMUSCULAR | Status: DC | PRN

## 2017-02-03 MED ORDER — INSULIN ASPART 100 UNIT/ML ~~LOC~~ SOLN
0.0000 [IU] | Freq: Three times a day (TID) | SUBCUTANEOUS | Status: DC
  Administered 2017-02-04: 5 [IU] via SUBCUTANEOUS
  Administered 2017-02-07: 2 [IU] via SUBCUTANEOUS
  Administered 2017-02-10: 3 [IU] via SUBCUTANEOUS
  Filled 2017-02-03: qty 0.15

## 2017-02-03 MED ORDER — PANTOPRAZOLE SODIUM 40 MG PO TBEC
40.0000 mg | DELAYED_RELEASE_TABLET | Freq: Every day | ORAL | Status: DC
  Administered 2017-02-03 – 2017-02-11 (×9): 40 mg via ORAL
  Filled 2017-02-03 (×9): qty 1

## 2017-02-03 MED ORDER — LIDOCAINE HCL 2 % EX GEL: CUTANEOUS | Status: DC | PRN

## 2017-02-03 MED ORDER — FINASTERIDE 5 MG PO TABS
5.0000 mg | ORAL_TABLET | Freq: Every day | ORAL | Status: DC
  Administered 2017-02-03 – 2017-02-11 (×9): 5 mg via ORAL
  Filled 2017-02-03 (×9): qty 1

## 2017-02-03 MED ORDER — POLYETHYLENE GLYCOL 3350 17 G PO PACK: 17.0000 g | PACK | Freq: Every day | ORAL | Status: DC | PRN

## 2017-02-03 MED ORDER — ADULT MULTIVITAMIN W/MINERALS CH
1.0000 | ORAL_TABLET | Freq: Every day | ORAL | Status: DC
  Administered 2017-02-03 – 2017-02-11 (×9): 1 via ORAL
  Filled 2017-02-03 (×9): qty 1

## 2017-02-03 MED ORDER — BUSPIRONE HCL 15 MG PO TABS
30.0000 mg | ORAL_TABLET | Freq: Every day | ORAL | Status: DC
  Administered 2017-02-03 – 2017-02-11 (×9): 30 mg via ORAL
  Filled 2017-02-03 (×9): qty 2

## 2017-02-03 MED ORDER — SENNOSIDES-DOCUSATE SODIUM 8.6-50 MG PO TABS: 2.0000 | ORAL_TABLET | Freq: Every evening | ORAL | Status: DC | PRN

## 2017-02-03 MED ORDER — PROCHLORPERAZINE MALEATE 5 MG PO TABS
5.0000 mg | ORAL_TABLET | Freq: Four times a day (QID) | ORAL | Status: DC | PRN
  Administered 2017-02-05: 10 mg via ORAL
  Filled 2017-02-03: qty 1
  Filled 2017-02-03: qty 2

## 2017-02-03 MED ORDER — TRAMADOL HCL 50 MG PO TABS
50.0000 mg | ORAL_TABLET | Freq: Four times a day (QID) | ORAL | Status: DC | PRN
  Administered 2017-02-06 – 2017-02-09 (×6): 50 mg via ORAL
  Filled 2017-02-03 (×6): qty 1

## 2017-02-03 MED ORDER — DEXTROSE 50 % IV SOLN
INTRAVENOUS | Status: AC
  Administered 2017-02-03: 25 g
  Filled 2017-02-03: qty 50

## 2017-02-03 MED ORDER — METHOCARBAMOL 500 MG PO TABS: 500.0000 mg | ORAL_TABLET | Freq: Four times a day (QID) | ORAL | 2 refills | Status: DC | PRN

## 2017-02-03 NOTE — Discharge Summary (Signed)
Physician Discharge Summary  Patient ID: Kenneth Pace MRN: [id number] DOB/AGE: 1950/06/25 66 y.o.  Admit date: 02/01/2017 Discharge date: 02/03/2017  Admission Diagnoses:  Cervical myelopathy  Discharge Diagnoses:  Same Active Problems:   Cervical myelopathy Kenneth Pace)  Discharged Condition: Stable  Pace Course:  Kenneth Pace is a 66 y.o. male who was admitted from CIR for the below procedure. There were no post operative complications. At time of discharge, pain was well controlled, ambulating with Pt/OT, tolerating po, voiding normal. Ready for discharge back to CIR  Treatments: Surgery 1. Discectomy at C3-4, C4-5, C5-6 for decompression of spinal cord and exiting nerve roots  2. Placement of intervertebral biomechanical device, Medtronic 75m PTC PEEK x3 3. Placement of anterior instrumentation consisting of interbody plate and screws spanning C3-C6 -Medtronic Atlantis 67.5 mm translational plate 4. Use of morselized bone allograft  5. Arthrodesis C3-4, C4-5, C5-6, anterior interbody technique  6. Use of intraoperative microscope    Discharge Exam: Blood pressure 129/70, pulse 95, temperature 98 F (36.7 C), temperature source Oral, resp. rate 20, height 6' (1.829 m), weight 103 kg (227 lb 1.2 oz), SpO2 96 %. Awake, alert, oriented Speech fluent, appropriate CN grossly intact MAEW with good strength Wound c/d/i  Disposition: CIR  Discharge Instructions    Call MD for:  difficulty breathing, headache or visual disturbances   Complete by:  As directed    Call MD for:  persistant dizziness or light-headedness   Complete by:  As directed    Call MD for:  redness, tenderness, or signs of infection (pain, swelling, redness, odor or green/yellow discharge around incision site)   Complete by:  As directed    Call MD for:  severe uncontrolled pain   Complete by:  As directed    Call MD for:  temperature >100.4   Complete by:  As directed    Diet general    Complete by:  As directed    Driving Restrictions   Complete by:  As directed    Do not drive until given clearance.   Increase activity slowly   Complete by:  As directed    Lifting restrictions   Complete by:  As directed    Do not lift anything >10lbs. Avoid bending and twisting in awkward positions. Avoid bending at the back.   May shower / Bathe   Complete by:  As directed    In 24 hours. Okay to wash wound with warm soapy water. Avoid scrubbing the wound. Pat dry.   Remove dressing in 24 hours   Complete by:  As directed      Allergies as of 02/03/2017   No Known Allergies     Medication List    TAKE these medications   ACCU-CHEK MULTICLIX LANCET DEV Kit 1 Device by Other route 5 (five) times daily as needed. 5/day dx 250.01   acetaminophen 325 MG tablet Commonly known as:  TYLENOL Take 2 tablets (650 mg total) by mouth every 6 (six) hours as needed for mild pain or headache.   busPIRone 30 MG tablet Commonly known as:  BUSPAR TAKE ONE-HALF (1/2) TABLET (15 MG) TWICE A DAY What changed:  See the new instructions.   co-enzyme Q-10 30 MG capsule Take 30 mg by mouth daily.   Cranberry 400 MG Tabs Take 2 each by mouth daily.   ENLITE GLUCOSE SENSOR Misc 1 Device by Does not apply route every 3 (three) days.   finasteride 5 MG tablet Commonly known as:  PROSCAR Take 5 mg by mouth daily.   gabapentin 600 MG tablet Commonly known as:  NEURONTIN Take 1 tablet (600 mg total) by mouth 3 (three) times daily.   glucagon 1 MG injection Inject 1 mg into the vein once as needed. What changed:  reasons to take this   glucose blood test strip Commonly known as:  BAYER CONTOUR NEXT TEST Use to check blood sugar 5 times per day dx 250.01,   HUMALOG 100 UNIT/ML injection Generic drug:  insulin lispro INJECT 120 UNITS DAILY IN PUMP   lisinopril-hydrochlorothiazide 10-12.5 MG tablet Commonly known as:  PRINZIDE,ZESTORETIC TAKE 1 TABLET DAILY   methocarbamol 500 MG  tablet Commonly known as:  ROBAXIN Take 1 tablet (500 mg total) by mouth every 6 (six) hours as needed for muscle spasms.   Misc. Devices Misc IV 3000 infusion set cover.  Change every 3 days   multivitamin,tx-minerals tablet Take 1 tablet by mouth daily.   oxyCODONE-acetaminophen 7.5-325 MG tablet Commonly known as:  PERCOCET Take 1 tablet by mouth every 4 (four) hours as needed for severe pain.   PARADIGM RESERVOIR 3ML Misc 1 Device by Does not apply route every 3 (three) days.   QUICK-SET INFUSION 43" 9MM Misc 1 Device by Does not apply route every 3 (three) days.   VITAMIN B-12 CR PO Take 1 each by mouth daily.        SignedTraci Sermon 02/03/2017, 10:25 AM

## 2017-02-03 NOTE — Progress Notes (Signed)
PMR Admission Coordinator Pre-Admission Assessment  Patient: Kenneth Pace is an 66 y.o., male MRN: [id number] DOB: Feb 14, 1951 Height: 6' (182.9 cm) Weight: 103 kg (227 lb 1.2 oz)                                                                                                                                      Insurance Information HMO:     PPO: X     PCP:      IPA:      80/20:      OTHER: [employer]  PRIMARY: Conservation officer, nature for [employer]      Policy#: [id number]      Subscriber: Self CM Name: Santiago Glad      Phone#: [phone]     Fax#: [phone] Pre-Cert#: [id number] for 02/03/17-02/09/17      Employer: Full Time, [employer] Benefits:  Phone #: Verified online     Name: [email or web address] Eff. Date: 02/17/12     Deduct: $400      Out of Pocket Max: $2500      Life Max: N/A CIR: 85%/15%      SNF: 85%/15% Outpatient: PT/OT     Co-Pay: $40 per visit  Home Health: 85%      Co-Pay: 15% DME: 85%     Co-Pay: 15% Providers: In-network   SECONDARY: None      Policy#:       Subscriber:  CM Name:       Phone#:      Fax#:  Pre-Cert#:       Employer:  Benefits:  Phone #:      Name:  Eff. 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 [name] readmitted on 12/29/16 withreports of progressive decline that started during his trip with tightness around abdomen with difficulty breathing,progressive difficulty walking with numbness, difficulty swallowing and inability to void.Has been evaluated by neurology with evidence of demyelinating  neuropathy on EMG.Dr. Rory Percy recommended evaluation for polyradiculoneuropathy v/s transverse myelitis--doubt GBS. MRI thoracic spine revealed multifocal disc protrusions with cord flattening T5-T10 without significant stenosis. Patient with tetraplegia and neuro felt exam due todemyelinatingmyeloneuropathy.   Work up underway to rule out paraneoplastic panel.LP done revealing elevated protein 121 and WBC - 7. MRI brain/cervical spinerepeated and was negative foracute intracranial abnormality and multilevel moderate stenosis C3/4 and C5/6 with mild cord deformity but no signal changes.CT abdomen/pelvis done to rule out paraneoplastic syndrome and was negative for acute abnormality. Incidental findings of fairly large amount of stool in colon, thickened bladder wall --favor chronic neurogenic bladder and enlarged prostate causing mass effect on bladder.   Dr. Kathyrn Sheriff consulted and recommended trial of plasma exchange as symptoms favor AIDP but could have component of symptomatic cervical myelopathy. If he does not show improvement to consider decompression of cervical spondylosis.He completed 5 rounds of treatment and is showing some improvement.Therapies recommending IP Rehab for post acute therapies and patient admitted 01/11/17. Patient participated in [place] and during his stay weekly team conferences were held to monitor progress, set goals and discuss barriers to discharge.At admission, patient required max assist for ADL tasks and mod assist for transfers. Hehas hadsomeimprovement in  activity tolerance, balance, postural control, as well as ability to compensate for deficits.Bladder issues worsened with retention requiring in and out caths.  Dr. Kathyrn Sheriff re-consulted with recommendation for surgery to address his cervical stenosis.  Patient transferred to surgery 12/17 for C3-5 ACDF. There were no post operative complications. Currently, pain well controlled, ambulating with Min A and requiring Mod A for transfers, tolerating PO, and voiding normal.  Therefore, he is being re-admitted 12/19 as an interrupted stay to complete his rehab course.    Past Medical History      Past Medical History:  Diagnosis Date  . ADENOIDECTOMY, HX OF 02/16/2007  . Anxiety    pt. states he does not have anxiety.  Marland Kitchen BENIGN PROSTATIC HYPERTROPHY, WITH OBSTRUCTION 02/03/2010  . DIABETES MELLITUS, TYPE I, UNCONTROLLED 12/29/2006  . ERECTILE DYSFUNCTION 02/16/2007  . HYPERLIPIDEMIA 02/16/2007  . HYPERTENSION 02/16/2007  . TESTICULAR MASS, LEFT 02/16/2007    Family History  family history includes Dementia in his mother; Diabetes Mellitus I in his mother; Healthy in his sister; Hypertension in his father; Rheum arthritis in his brother.  Prior Rehab/Hospitalizations:  Has the patient had major surgery during 100 days prior to admission? Yes  Current Medications   Current Facility-Administered Medications:  .  0.9 %  sodium chloride infusion, , Intravenous, Continuous, Nundkumar, Neelesh, MD .  0.9 %  sodium chloride infusion, 250 mL, Intravenous, Continuous, Consuella Lose, MD .  acetaminophen (TYLENOL) tablet 650 mg, 650 mg, Oral, Q4H PRN **OR** acetaminophen (TYLENOL) suppository 650 mg, 650 mg, Rectal, Q4H PRN, Consuella Lose, MD .  bisacodyl (DULCOLAX) suppository 10 mg, 10 mg, Rectal, Daily PRN, Consuella Lose, MD .  busPIRone (BUSPAR) tablet 30 mg, 30 mg, Oral, QHS, Consuella Lose, MD, Stopped at 02/01/17 2224 .  docusate sodium (COLACE) capsule 100 mg, 100  mg, Oral, BID, Consuella Lose, MD .  finasteride (PROSCAR) tablet 5 mg, 5 mg, Oral, QHS, Nundkumar, Neelesh, MD .  gabapentin (NEURONTIN) tablet 600 mg, 600 mg, Oral, TID, Consuella Lose, MD, 600 mg at 02/02/17 1137 .  lisinopril (PRINIVIL,ZESTRIL) tablet 10 mg, 10 mg, Oral, QHS **AND** hydrochlorothiazide (MICROZIDE) capsule 12.5 mg, 12.5 mg, Oral, QHS, Nundkumar, Neelesh, MD .  insulin aspart (novoLOG) injection 0-15 Units, 0-15 Units, Subcutaneous, TID WC,  Consuella Lose, MD .  insulin pump, , Subcutaneous, TID AC, HS, 0200, Consuella Lose, MD, 3 each at 02/01/17 2225 .  ketorolac (TORADOL) 15 MG/ML injection 15 mg, 15 mg, Intravenous, Q6H, Consuella Lose, MD, 15 mg at 02/02/17 1137 .  menthol-cetylpyridinium (CEPACOL) lozenge 3 mg, 1 lozenge, Oral, PRN **OR** phenol (CHLORASEPTIC) mouth spray 1 spray, 1 spray, Mouth/Throat, PRN, Consuella Lose, MD, 1 spray at 02/02/17 0159 .  methocarbamol (ROBAXIN) tablet 500 mg, 500 mg, Oral, Q6H PRN **OR** methocarbamol (ROBAXIN) 500 mg in dextrose 5 % 50 mL IVPB, 500 mg, Intravenous, Q6H PRN, Consuella Lose, MD .  morphine 2 MG/ML injection 2 mg, 2 mg, Intravenous, Q2H PRN, Consuella Lose, MD, 2 mg at 02/01/17 2224 .  multivitamin with minerals tablet 1 tablet, 1 tablet, Oral, QHS, Nundkumar, Neelesh, MD .  ondansetron (ZOFRAN) tablet 4 mg, 4 mg, Oral, Q6H PRN **OR** ondansetron (ZOFRAN) injection 4 mg, 4 mg, Intravenous, Q6H PRN, Consuella Lose, MD, 4 mg at 02/01/17 1918 .  oxyCODONE (Oxy IR/ROXICODONE) immediate release tablet 10 mg, 10 mg, Oral, Q3H PRN, Consuella Lose, MD, 10 mg at 02/02/17 1012 .  oxyCODONE (Oxy IR/ROXICODONE) immediate release tablet 5 mg, 5 mg, Oral, Q3H PRN, Consuella Lose, MD .  pantoprazole (PROTONIX) EC tablet 40 mg, 40 mg, Oral, QHS, Nundkumar, Neelesh, MD .  polyethylene glycol (MIRALAX / GLYCOLAX) packet 17 g, 17 g, Oral, Daily PRN, Consuella Lose, MD .  promethazine (PHENERGAN)  injection 25 mg, 25 mg, Intravenous, Q8H PRN, Costella, Vincent J, PA-C, 25 mg at 02/01/17 2129 .  senna (SENOKOT) tablet 8.6 mg, 1 tablet, Oral, BID, Nundkumar, Neelesh, MD .  sodium chloride flush (NS) 0.9 % injection 3 mL, 3 mL, Intravenous, Q12H, Consuella Lose, MD, 3 mL at 02/02/17 1145 .  sodium chloride flush (NS) 0.9 % injection 3 mL, 3 mL, Intravenous, PRN, Consuella Lose, MD .  sodium phosphate (FLEET) 7-19 GM/118ML enema 1 enema, 1 enema, Rectal, Once PRN, Consuella Lose, MD .  vitamin B-12 (CYANOCOBALAMIN) tablet 1,000 mcg, 1,000 mcg, Oral, QHS, Consuella Lose, MD  Patients Current Diet: Diet Carb Modified Fluid consistency: Thin; Room service appropriate? Yes  Precautions / Restrictions Precautions Precautions: Fall, Cervical Precaution Comments: Cervical precautions; Aspen collar on when ambulating, up/OOB and sitting up in chair. May remove to eat, bathe or when in bed and resting per MD discussion with pt/spouse in pt room, OTR/L was present for this. 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 He was active enjoyed his dogs and rebuilding car engines.  Home Assistive Devices / Equipment Home Assistive Devices/Equipment: Wheelchair, CBG Meter, CPAP, Blood pressure cuff Home Equipment: Cane - single point, Crutches, Walker - 2 wheels, Walker - 4 wheels  Prior Device Use: Indicate devices/aids used by the patient prior to current illness, exacerbation or injury? Manual wheelchair, Games developer  Prior Functional Level Prior Function Level of Independence: Needs assistance Gait / Transfers Assistance Needed: Pt coming from CIR, and last  PT session was ambulating only 4-ft with mod assist and RW.  ADL's / Homemaking Assistance Needed: Has been sponge bathing with assist from his wife. Reports increasing difficulty with dressing.  Comments: Works as a Freight forwarder  for [employer]; 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  [place]: Did the patient need help bathing, dressing, using the toilet or eating? Independent  Indoor Mobility: Did the patient need assistance with walking from room to room (with or without device)? Independent  Stairs: Did the patient need assistance with internal or external stairs (with or without device)? Independent  Functional Cognition: Did the patient need help planning regular tasks such as shopping or remembering to take medications? Independent  Current Functional Level Cognition  Overall Cognitive Status: Within Functional Limits for tasks assessed Orientation Level: Oriented X4    Extremity Assessment (includes Sensation/Coordination)  Upper Extremity Assessment: Defer to OT evaluation RUE Deficits / Details: Overall bilateral decreased coordination secondary to numbness, edema. No MMT was performed at this time secondary to ACDF C3-5; cervical precaitions and Aspen collar. Limitations noted with purposeful release. Decreased coordination overall. Decreased grip, decreased grasp/release noted bilaterally.  Lower Extremity Assessment: RLE deficits/detail, LLE deficits/detail RLE Deficits / Details: ankle DF 3/5, knee extension 3/5, sustained clonus LLE Deficits / Details: ankle DF 3/5, knee extension 3/5, sustained clonus    ADLs  Overall ADL's : Needs assistance/impaired Eating/Feeding: Supervision/ safety, Set up, Sitting Eating/Feeding Details (indicate cue type and reason): with built-up handles Grooming: Wash/dry hands, Oral care, Moderate assistance, Standing, Cueing for safety Grooming Details (indicate cue type and reason): Grooming standing at sink. Leaning on sink to steady,  difficulty opening toothpaste and to squeeze toothpaste container Upper Body Bathing: Moderate assistance, Sitting, Cueing for safety Lower Body Bathing: Maximal assistance, Sit to/from stand Upper Body Dressing : Moderate assistance, Sitting, Cueing for safety Upper Body Dressing Details (indicate cue type and reason): Safety cues secondary to cervical precautions Lower Body Dressing: Maximal assistance, Cueing for safety, Sit to/from stand Toilet Transfer: Moderate assistance, +2 for safety/equipment, RW, Ambulation(Simulated transfer from recliner chair back to bed after grooming standing at sink) Toilet Transfer Details (indicate cue type and reason): Pt seen +1, however recommend +2 for safety with functional mobility and ambulation. Spouse and NT present off and on during asssessment today. Noted compensatory movements to achieve stability on his feet.  Toileting- Clothing Manipulation and Hygiene: Sit to/from stand, +2 for safety/equipment, Maximal assistance, Cueing for safety Functional mobility during ADLs: Minimal assistance, Moderate assistance, +2 for safety/equipment, Cueing for safety, Cueing for sequencing, Rolling walker General ADL Comments: Pt benefits from +2 during ADL's for safety at this time. Pt reports increased independence with functional mobility/transfers as compared to prior to ACDF C3-5.  Pt is highly motivated to work with therapies and hopes to return to CIR/In-pt Rehab when medically able.    Mobility  Overal bed mobility: Needs Assistance Bed Mobility: Rolling Rolling: Min guard Sidelying to sit: Min guard, HOB elevated Sit to sidelying: Mod assist(Mod A for safety/sequencing and cervical precautions) General bed mobility comments: increased time and use of bed railing    Transfers  Overall transfer level: Needs assistance Equipment used: Rolling walker (2 wheeled) Transfers: Sit to/from Stand Sit to Stand: Mod assist General transfer comment: cues for  hand placement. STS from EOB x1. Pt in chair post-ambulation.     Ambulation / Gait / Stairs / Wheelchair Mobility  Ambulation/Gait Ambulation/Gait assistance: Museum/gallery curator (Feet): 80 Feet Assistive device: Rolling walker (2 wheeled) Gait Pattern/deviations: Step-through pattern, Decreased stride length General Gait Details: Pt demonstrates gait with knee instability followed by hyperextension thrust bilaterally. Pt uses trunk and momentum to propel legs forward.  Gait velocity: decreased  Posture / Balance Dynamic Sitting Balance Sitting balance - Comments: Pt able to sit EOB without UE support.  Balance Overall balance assessment: Needs assistance Sitting-balance support: Feet unsupported, Bilateral upper extremity supported Sitting balance-Leahy Scale: Fair Sitting balance - Comments: Pt able to sit EOB without UE support.  Standing balance support: Bilateral upper extremity supported, Single extremity supported Standing balance-Leahy Scale: Poor Standing balance comment: Pt reliant on UEs on RW for standing balance.     Special needs/care consideration BiPAP/CPAP: Yes, CPAP CPM: No Continuous Drip IV: No Dialysis: No Life Vest: No Oxygen: No Special Bed: No Trach Size: No Wound Vac (area): No Skin: Dry, Abrasions to bilateral legs and toes Bowel mgmt:Continent, last BM 01/10/17 Bladder mgmt:Acute urinary retention with foley catheter placedinitially, voiding trials, and now continent Diabetic mgmt: Yes, managed with an insulin pump prior to admission     Previous Home Environment Living Arrangements: Spouse/significant other  Lives With: Spouse Available Help at Discharge: Family, Available 24 hours/day Type of Home: House Home Layout: Two level, Bed/bath upstairs, Able to live on main level with bedroom/bathroom Alternate Level Stairs-Rails: Can reach both Alternate Level Stairs-Number of  Steps: 17 Home Access: Stairs to enter Entrance Stairs-Rails: Can reach both, Left, Right Entrance Stairs-Number of Steps: 2 Bathroom Shower/Tub: Tub/shower unit, Industrial/product designer: Yes Home Care Services: No Additional Comments: Has been using RW PTA over the past couple of months  Discharge Living Setting Plans for Discharge Living Setting: Patient's home, Lives with (comment)(Spouse) Type of Home at Discharge: House Discharge Home Layout: Two level, Able to live on main level with bedroom/bathroom Alternate Level Stairs-Rails: Can reach both Alternate Level Stairs-Number of Steps: 17 Discharge Home Access: Stairs to enter Entrance Stairs-Rails: None Entrance Stairs-Number of Steps: 3 Discharge Bathroom Shower/Tub: Tub/shower unit, Curtain Discharge Bathroom Toilet: Standard Discharge Bathroom Accessibility: Yes How Accessible: Accessible via walker Does the patient have any problems obtaining your medications?: No  Social/Family/Support Systems Patient Roles: Spouse, Parent Contact Information: Spouse Lexicographer  Anticipated Caregiver: Spouse, Manufacturing systems engineer Information: [phone] Ability/Limitations of Caregiver: Spouse works from home  Caregiver Availability: 24/7 Discharge Plan Discussed with Primary Caregiver: Yes Is Caregiver In Agreement with Plan?: Yes Does Caregiver/Family have Issues with Lodging/Transportation while Pt is in Rehab?: No  Goals/Additional Needs Patient/Family Goal for Rehab: PT/OT: Mod I -Supervision  Expected length of stay: 11-17 days  Cultural Considerations: None Dietary Needs: Heart Healthy/Carb Mod. diet restrictions  Equipment Needs: TBD Special Service Needs: Pt with insulin pump Pt/Family Agrees to Admission and willing to participate: Yes Program Orientation Provided &Reviewed with Pt/Caregiver Including Roles &Responsibilities: Yes Additional  Information Needs: None  Decrease burden of Care through IP rehab admission: No  Possible need for SNF placement upon discharge: No  Patient Condition: This patient's medical and functional status has changed since the consult dated: 01/01/17 in which the Rehabilitation Physician determined and documented that the patient's condition is appropriate for intensive rehabilitative care in an inpatient rehabilitation facility. See "History of Present Illness" (above) for medical update. Functional changes are: Mod A transfers and Min A gait 80 feet. Patient's medical and functional status update has been discussed with the Rehabilitation physician and patient remains appropriate for inpatient rehabilitation. Will re-admit to inpatient rehab today as an interrupted stay.  Preadmission Screen Completed By:  Gunnar Fusi, 02/02/2017 1:25 PM ______________________________________________________________________   Discussed status with Dr. Posey Pronto on 02/03/17 at 79 and received telephone approval for admission today.  Admission Coordinator:  Gunnar Fusi, time 1210/Date 02/03/17             Cosigned by: Jamse Arn, MD at 02/03/2017 1:43 PM  Revision History

## 2017-02-03 NOTE — Progress Notes (Signed)
Inpatient Rehabilitation  I have received authorization to re-admit patient to IP Rehab today to complete his rehab course.  I have received medical clearance and plan to proceed with admission today.  Updated team; call if questions.   Carmelia Roller., CCC/SLP Admission Coordinator  [place]  Cell [phone]

## 2017-02-03 NOTE — Progress Notes (Signed)
Pt admitted to 4M05 with no issues. Pt alert and oriented and dinner at bedside. Pt assessed and vitals are stable. Continue plan of care.

## 2017-02-03 NOTE — Progress Notes (Signed)
Patient has self managed insulin pump at 0230 blood sugar was 39, patient is asymptomatic gave him 15 grams of carbohydrate will check blood sugar again 0300. Patients blood sugar was 37 after 15 grams of carbohydrate, used hypoglycemic protocol to get 25 mg of D 50 after administering CBG is 184 will check again around 0630.

## 2017-02-03 NOTE — Progress Notes (Addendum)
Patient adjusted his basil insulin from midnight to 0600 this morning from 2.5 to 2.0. Patient blood sugar this AM was 150 patient did not give himself any bolus insulin.

## 2017-02-03 NOTE — Progress Notes (Signed)
Physical Medicine and Rehabilitation Consult   Reason for Consult:Functional deficits in mobility and ADLs Referring Physician:Dr. Thereasa Solo   CWU:[id number] G Weatherlyis a 66 y.o.malewith history of T2DM, OSA,,subacute inflammatory polyradiculoneuropathy treated withIVIG for lower extremity weakness10/2018 and was attending outpatient PT.Patient recently returned from cruise --used Doctor, general practice get around. [name] readmitted on 12/29/16 withreports of progressive decline that started during his trip with tightness around abdomen with difficulty breathing,progressive difficulty walking with numbness, difficulty swallowing and inability to void.Has been evaluated by neurology with evidence of demyelinating neuropathy on EMG. Dr. Rory Percy recommended evaluation for polyradiculoneuropathy v/s transverse myelitis--doubt GBS. MRI thoracic spine revealed multifocal disc protrusions with cord flattening T5-T10 without significant stenosis. Patient with tetraplegia and neuro felt exam due to myeloneuropathy.   Work up underway to rule out paraneoplastic panel.LP done yesterday revealing elevated protein 121 and WBC - 7. MRI brain/cervical spinerepeated and was negative foracute intracranial abnormality and multilevel moderate stenosis C3/4 and C5/6 with mild cord deformity but no signal changes.CT chest/abdomen pending. Wife reports plans on starting plasmapheresis soon.Therapy evaluations completed today and CIR recommended due to functional deficits.   Review of Systems  HENT: Negative forhearing lossand tinnitus.  Eyes: Negative forblurred visionand double vision.  Respiratory: Has difficulty breathing. Cardiovascular: Negative forchest painand palpitations.  Gastrointestinal: Positive forconstipation. Negative forabdominal pain,heartburnand nausea.  Genitourinary: Negative fordysuriaand urgency. Difficulty voiding Musculoskeletal: Negative  formyalgias.  Skin: Negative foritchingand rash.  Neurological: Positive forsensory change,focal weakness,seizuresand weakness(has good days and bad days). Negative fordizzinessand headaches.  Psychiatric/Behavioral: Negative forhallucinations. The patientis not nervous/anxiousand does not have insomnia.       Past Medical History:  Diagnosis Date  . ADENOIDECTOMY, HX OF 02/16/2007  . ANXIETY 02/16/2007  . BENIGN PROSTATIC HYPERTROPHY, WITH OBSTRUCTION 02/03/2010  . DIABETES MELLITUS, TYPE I, UNCONTROLLED 12/29/2006  . ERECTILE DYSFUNCTION 02/16/2007  . HYPERLIPIDEMIA 02/16/2007  . HYPERTENSION 02/16/2007  . TESTICULAR MASS, LEFT 02/16/2007         Past Surgical History:  Procedure Laterality Date  . KNEE SURGERY    . KNEE SURGERY Right 11/12/2016  . TONSILLECTOMY           Family History  Problem Relation Age of Onset  . Dementia Mother   . Diabetes Mellitus I Mother   . Hypertension Father    41  . Healthy Sister   . Rheum arthritis Brother   . Cancer Neg Hx     Social History:Married--wife is NS nurse who works out of home.He was working for [employer] until kneearthroscopyin August. He has been using walker/scooter to get around since October. Hereports that has never smoked. he has never used smokeless tobacco. He reports that he does not drink alcohol or use drugs.    Allergies:No Known Allergies         Medications Prior to Admission  Medication Sig Dispense Refill  . acetaminophen (TYLENOL) 325 MG tablet Take 2 tablets (650 mg total) by mouth every 6 (six) hours as needed for mild pain or headache.    . busPIRone (BUSPAR) 30 MG tablet TAKE ONE-HALF (1/2) TABLET (15 MG) TWICE A DAY (Patient taking differently: 30MG BY MOUTH AT BEDTIME) 180 tablet 1  . co-enzyme Q-10 30 MG capsule Take 30 mg by mouth daily.     . Continuous Glucose Monitor Sup (ENLITE GLUCOSE SENSOR) MISC 1 Device by Does  not apply route every 3 (three) days. 30 each 3  . Cranberry 400 MG TABS Take 2 each by mouth daily.     . Cyanocobalamin (  VITAMIN B-12 CR PO) Take 1 each by mouth daily.     Marland Kitchen ezetimibe (ZETIA) 10 MG tablet TAKE 1 TABLET DAILY 90 tablet 3  . finasteride (PROSCAR) 5 MG tablet Take 5 mg by mouth daily.     Marland Kitchen glucagon 1 MG injection Inject 1 mg into the vein once as needed. (Patient taking differently: Inject 1 mg once as needed into the vein (severe hypoglycemia). ) 1 each 12  . glucose blood (BAYER CONTOUR NEXT TEST) test strip Use to check blood sugar 5 times per day dx 250.01, 500 each 3  . HUMALOG 100 UNIT/ML injection INJECT 120 UNITS DAILY IN PUMP 100 mL 11  . Insulin Infusion Pump Supplies (PARADIGM RESERVOIR 3ML) MISC 1 Device by Does not apply route every 3 (three) days. 30 each 3  . Insulin Infusion Pump Supplies (QUICK-SET INFUSION 43" 9MM) MISC 1 Device by Does not apply route every 3 (three) days. 30 each 3  . Lancets Misc. (ACCU-CHEK MULTICLIX LANCET DEV) KIT 1 Device by Other route 5 (five) times daily as needed. 5/day dx 250.01 450 each 3  . lisinopril-hydrochlorothiazide (PRINZIDE,ZESTORETIC) 10-12.5 MG tablet TAKE 1 TABLET DAILY 90 tablet 1  . Misc. Devices MISC IV 3000 infusion set cover. Change every 3 days 30 each 3  . Multiple Vitamins-Minerals (MULTIVITAMIN,TX-MINERALS) tablet Take 1 tablet by mouth daily.     . rosuvastatin (CRESTOR) 40 MG tablet TAKE 1 TABLET DAILY 90 tablet 3    Home: Home Living Family/patient expects to be discharged to:: Inpatient rehab Living Arrangements: Spouse/significant other Available Help at Discharge: Family, Available 24 hours/day Type of Home: House Home Access: Stairs to enter [employer] of Steps: 3 Entrance Stairs-Rails: None Home Layout: Two level, Bed/bath upstairs, Able to live on main level with bedroom/bathroom Bathroom Shower/Tub: Chiropodist: Standard Home Equipment: [place] -  single point, Crutches, Environmental consultant - 2 wheels, Walker - 4 wheels, Electric scooter Additional Comments: Reports he has not been going upstairs as it has become too difficult. Functional History: Prior Function Level of Independence: Needs assistance Gait / Transfers Assistance Needed: Reports he has been requiring increased assist for gait with RW and has had a fall secondary to weakness.  ADL's / Homemaking Assistance Needed: Has been sponge bathing with assist from his wife. Reports increasing difficulty with dressing. Functional Status: Mobility: Bed Mobility Overal bed mobility: Needs Assistance Bed Mobility: Supine to Sit, Sit to Supine Supine to sit: Min assist Sit to supine: Min guard General bed mobility comments: MinA for HHA to assist trunk into elevation; no assist needed for returning to supine Transfers Overall transfer level: Needs assistance Equipment used: Rolling walker (2 wheeled) Transfers: Sit to/from Stand Sit to Stand: Min guard, Min assist General transfer comment: Stood from bed with RW minA to assist trunk elevation and cue bilat hip ext. Stood from raised toilet with RW and min guard for balance. Cues for hand placement on RW Ambulation/Gait Ambulation/Gait assistance: Min guard Ambulation Distance (Feet): 30 Feet Assistive device: Rolling walker (2 wheeled) Gait Pattern/deviations: Step-through pattern, Decreased stride length, Wide base of support, Decreased dorsiflexion - right, Decreased dorsiflexion - left General Gait Details: Amb to/from bathroom with RW and min guard for balance secondary to BLE instability; mild instability with increased steppage due to decreased ankle DF. Further mobility limited secondary to arrival of transport for lumbar puncture.  Gait velocity: Decreased Gait velocity interpretation: <1.8 ft/sec, indicative of risk for recurrent falls  ADL: ADL Overall ADL's : Needs  assistance/impaired Eating/Feeding: Bed level, Supervision/  safety Eating/Feeding Details (indicate cue type and reason): with built-up handles Grooming: Supervision/safety, Bed level Grooming Details (indicate cue type and reason): with built-up handles Upper Body Bathing: Bed level, Moderate assistance Lower Body Bathing: Maximal assistance, Bed level Upper Body Dressing : Bed level, Moderate assistance Lower Body Dressing: Maximal assistance, Bed level General ADL Comments: Session focused on self-feeding with built-up handles. Will cotninue to assess mobility.  Cognition: Cognition Overall Cognitive Status: Within Functional Limits for tasks assessed Orientation Level: Oriented X4 Cognition Arousal/Alertness: Awake/alert Behavior During Therapy: WFL for tasks assessed/performed Overall Cognitive Status: Within Functional Limits for tasks assessed  Blood pressure 121/75, pulse 80, temperature (!) 96.7 F (35.9 C), temperature source Axillary, resp. rate 19, height 6' (1.829 m), weight 103.1 kg (227 lb 6.4 oz), SpO2 96 %. Physical Exam Nursing noteand vitalsreviewed. Constitutional: He isoriented to person, place, and time. He appearswell-developedand well-nourished.  HENT:  Head:Normocephalicand atraumatic.  Mouth/Throat:Oropharynx is clear and moist.  Eyes:Conjunctivaeare normal. Pupils are equal, round, and reactive to light.  Neck:Normal range of motion.Neck supple.  Cardiovascular:Normal rateand regular rhythm.  Respiratory:Effort normaland breath sounds normal. Nostridor.  IA:XKPV.Bowel sounds are normal. He exhibitsno distension. There isno tenderness.  Musculoskeletal: He exhibits noedemaor tenderness.  Neurological: He isalertand oriented to person, place, and time. Upper extremity motor exam grossly 4 out of 5 proximal distal. He does have stocking glove sensory loss from the hands to mid forearms. He also has sensory loss in the trunk and lower extremities with sensation being most impaired  distally. Hip flexors are 1+ to 2 out of 5 as are his knee extensors. Ankle dorsiflexion and plantar flexion is 3 to 3+ out of 5. Patellar tendon reflexes are 3+. Reflexes in the upper extremities are 1-2+. No bulbar signs Skin: Skin iswarmand dry.  Psychiatric: He has anormal mood and affect. Hisbehavior is normal.Judgmentand thought contentnormal.  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 Does the need for close, 24 hr/day medical supervision in concert with the patient's rehab needs make it unreasonable for this patient to be served in a less intensive setting?Yes 2. Co-Morbidities requiring supervision/potential complications:Diabetes, hypertension, neurogenic bowel and bladder 3. Due tobladder management, bowel management, safety, skin/wound care, disease management, medication administration, pain management and patient education, does the patient require 24 hr/day rehab nursing?Yes 4. Does the patient require coordinated care of a physician, rehab nurse,PT (1-2hrs/day, 5days/week) and OT (1-2hrs/day, 5days/week)to address physical and functional deficits in the context of the above medical diagnosis(es)?Yes Addressing deficits in the following areas:balance, endurance, locomotion, strength, transferring, bowel/bladder control, bathing, dressing, feeding, grooming, toileting and psychosocial support 5. Can the patient actively participate in an intensive therapy program of at least 3 hrs of therapy per day at least 5 days per week?Yes 6. The potential for patient to make measurable gains while on inpatient rehab isexcellent 7. Anticipated functional outcomes upon discharge from inpatient rehab aremodified independent and supervisionwith PT, modified independent and supervisionwith OT, n/awith SLP. 8. Estimated rehab length of stay to reach the above  functional goals is:11-17 days 9. Anticipated D/C setting:Home 10. Anticipated post D/C treatments:HH therapy and Outpatient therapy 11. Overall Rehab/Functional Prognosis:good  RECOMMENDATIONS: This patient's condition is appropriate for continued rehabilitative care in the following setting:CIR Patient has agreed to participate in recommended program.Yes Note that insurance prior authorization may be required for reimbursement for recommended care.  Comment:We will follow along as the diagnostic workup and treatment continues.  Meredith Staggers, MD, [facility] 01/01/2017    Flora Lipps 01/01/2017          Revision History                                       Routing History                               Electronically signed by Gunnar Fusi at 01/12/2017 8:08 AM     Admission (Discharged) on 01/11/2017        Detailed Report

## 2017-02-03 NOTE — Care Management Note (Signed)
Case Management Note  Patient Details  Name: Kenneth Pace MRN: [id number] Date of Birth: 1950-10-14  Subjective/Objective:     Pt s/p cervical surgery. He is from home with his wife but was in [place] most recently.               Action/Plan: Pt discharging back to CIR today. No further needs per CM.  Expected Discharge Date:                  Expected Discharge Plan:  [place]  In-House Referral:     Discharge planning Services  CM Consult  Post Acute Care Choice:    Choice offered to:     DME Arranged:    DME Agency:     HH Arranged:    [place] Agency:     Status of Service:  Completed, signed off  If discussed at [employer] of Stay Meetings, dates discussed:    Additional Comments:  Pollie Friar, RN 02/03/2017, 10:12 AM

## 2017-02-03 NOTE — Progress Notes (Signed)
Inpatient Diabetes Program Recommendations  AACE/ADA: New Consensus Statement on Inpatient Glycemic Control (2015)  Target Ranges:  Prepandial:   less than 140 mg/dL      Peak postprandial:   less than 180 mg/dL (1-2 hours)      Critically ill patients:  140 - 180 mg/dL   Lab Results  Component Value Date   GLUCAP 183 (H) 02/03/2017   HGBA1C 7.3 (H) 11/20/2016    Review of Glycemic ControlResults for MILLIE, SHORB (MRN [id number]) as of 02/03/2017 12:51  Ref. Range 02/03/2017 02:36 02/03/2017 03:06 02/03/2017 03:42 02/03/2017 06:10 02/03/2017 11:21  Glucose-Capillary Latest Ref Range: 65 - 99 mg/dL 39 (LL) 37 (LL) 184 (H) 150 (H) 183 (H)   Insulin pump-  Inpatient Diabetes Program Recommendations:    Note low BS this AM.  According to RN note, patient has adjusted his basal rate and reduced it to 2.0 units/hr.  Note plans for D/C today.  Agree with changes made.   Thanks, Adah Perl, RN, BC-ADM Inpatient Diabetes Coordinator Pager [phone] (8a-5p)

## 2017-02-03 NOTE — H&P (Signed)
Physical Medicine and Rehabilitation Update from previous admission H&P due to interrupted stay    CC: Cervical myelopathy : HPI:   Kenneth Pace a 66 y.o.malewith history of T2DM, OSA,,subacute inflammatory polyradiculoneuropathy treated withIVIG for lower extremity weakness10/2018.  He was eadmitted on 12/29/16 withreports of progressive decline with reports of tightness around abdomen with difficulty breathing,progressive difficulty walking with numbness,difficulty swallowing and inability to void. Dr. 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  MRI of brain/cervical spinerepeated and was negative foracute intracranial abnormality but showed multilevel moderate stenosis C3/4 and C5/6 with mild cord deformity but no signal changes. Dr. Kathyrn Sheriff consulted and recommended trial of plasma exchange as symptoms favor AIDP and to consider decompression of cervical spondylosis if no improvement noted. He completed 5 rounds of treatment and was showing some improvement. Therapy ongoing and CIR was recommended due to functional deficits.    He was admitted to rehab 01/11/2017 for intensive rehab program qa npatient therapies to consist of PT and OT at least three hours five days a week. Past admission physiatrist, therapy team and rehab RN have worked together to provide customized collaborative inpatient rehab. He has been afebrile during his stay and blood pressures have been stable. Diabetes has been monitored with ac/hs checks as well as his continuous dermal monitor. He has had some issues with hypo and hyperglycemia and has been adjusting insulin according to intake as well as activity.   He continues to require I/O caths due to ongoing issues with urinary retention. Constipation has resolved with set up of bowel program. He continued to have issues with neuropathic symptoms and gabapentin was titrated up to 600 mg tid without side effects.  Mood has been stable and he has shown good motivation with therapy.  As therapy progressed, he started developing decrease in activity tolerance with extreme fatigue  by the end of the day. His wife expressed concerns about his rate of recovery and neurology felt that it was reasonable try another round of IVIG but also expressed concerns that cervical stenosis may be contributing to his symptoms.  He did not imporve with course of IVIG X 3 rounds therefore NS was consulted and recommended decompressive surgery.  He underwent ACDF C3-C5 on 02/01/17 by Dr. Kathyrn Sheriff and has had improvement in symptoms. He is being admitted back to CIR after an interrupted stay to resume his rehab course.   Review of Systems  Constitutional: Negative for chills and fever.  HENT: Negative for hearing loss and tinnitus.   Eyes: Negative for blurred vision and double vision.  Respiratory: Negative for cough, sputum production and shortness of breath.   Cardiovascular: Negative for chest pain and palpitations.  Gastrointestinal: Positive for constipation. Negative for heartburn and nausea.  Genitourinary: Negative for dysuria and urgency.  Musculoskeletal: Negative for myalgias and neck pain.  Skin: Negative for itching and rash.  Neurological: Positive for sensory change (feet no longer numb. RUE residual numbness in palms and fingers), speech change, focal weakness and weakness. Negative for dizziness and headaches.  Psychiatric/Behavioral: Negative for memory loss. The patient is not nervous/anxious.   All other systems reviewed and are negative.      Functional Status:  Mobility:  Transfers Overall transfer level: Needs assistance Equipment used: Rolling walker (2 wheeled) Transfers: Sit to/from Stand Sit to Stand: Mod assist General transfer comment: cues for hand placement. STS from EOB x1. Pt in chair post-ambulation.  Ambulation/Gait Ambulation/Gait assistance: Min assist Ambulation Distance (Feet):  80 Feet Assistive device: Rolling walker (2 wheeled) Gait Pattern/deviations: Step-through pattern, Decreased stride length General Gait Details: Pt demonstrates gait with knee instability followed by hyperextension thrust bilaterally. Pt uses trunk and momentum to propel legs forward.  Gait velocity: decreased    ADL: ADL Overall ADL's : Needs assistance/impaired Eating/Feeding: Supervision/ safety, Set up, Sitting Eating/Feeding Details (indicate cue type and reason): with built-up handles Grooming: Wash/dry hands, Oral care, Moderate assistance, Standing, Cueing for safety Grooming Details (indicate cue type and reason): Grooming standing at sink. Leaning on sink to steady, difficulty opening toothpaste and to squeeze toothpaste container Upper Body Bathing: Moderate assistance, Sitting, Cueing for safety Lower Body Bathing: Maximal assistance, Sit to/from stand Upper Body Dressing : Moderate assistance, Sitting, Cueing for safety Upper Body Dressing Details (indicate cue type and reason): Safety cues secondary to cervical precautions Lower Body Dressing: Maximal assistance, Cueing for safety, Sit to/from stand Toilet Transfer: Moderate assistance, +2 for safety/equipment, RW, Ambulation(Simulated transfer from recliner chair back to bed after grooming standing at sink) Toilet Transfer Details (indicate cue type and reason): Pt seen +1, however recommend +2 for safety with functional mobility and ambulation. Spouse and NT present off and on during asssessment today. Noted compensatory movements to achieve stability on his feet.  Toileting- Clothing Manipulation and Hygiene: Sit to/from stand, +2 for safety/equipment, Maximal assistance, Cueing for safety Functional mobility during ADLs: Minimal assistance, Moderate assistance, +2 for safety/equipment, Cueing for safety, Cueing for sequencing, Rolling walker General ADL Comments: Pt benefits from +2 during ADL's for safety at this time. Pt  reports increased independence with functional mobility/transfers as compared to prior to ACDF C3-5.  Pt is highly motivated to work with therapies and hopes to return to CIR/In-pt Rehab when medically able.  Cognition: Cognition Overall Cognitive Status: Within Functional Limits for tasks assessed Orientation Level: Oriented X4 Cognition Arousal/Alertness: Awake/alert Behavior During Therapy: WFL for tasks assessed/performed Overall Cognitive Status: Within Functional Limits for tasks assessed   Blood pressure 117/68, pulse 96, temperature 98.1 F (36.7 C), temperature source Oral, resp. rate 20, height 6' (1.829 m), weight 103 kg (227 lb 1.2 oz), SpO2 95 %. Physical Exam  Nursing note reviewed. Constitutional: He is oriented to person, place, and time. He appears well-developed and well-nourished. No distress.  HENT:  Head: Normocephalic and atraumatic.  Mouth/Throat: Oropharynx is clear and moist.  Eyes: Conjunctivae and EOM are normal. Pupils are equal, round, and reactive to light.  Neck: Normal range of motion. Neck supple.  Right neck with honey comb dressing and minimal edema  Cardiovascular: Normal rate and regular rhythm.  No murmur heard. Respiratory: Effort normal and breath sounds normal. No stridor. No respiratory distress. He has no wheezes.  GI: Soft. He exhibits distension. Bowel sounds are decreased. There is no tenderness.  Insulin pump in place.   Musculoskeletal: He exhibits no edema or tenderness.  Neurological: He is alert and oriented to person, place, and time.  Motor" B/L UE 4+/5 proximal to distal B/l LE: 4+/5 proximal to distal Sensation diminished to light touch in hands > feet (improving)  Skin: Skin is warm and dry. No rash noted. He is not diaphoretic. No erythema.  Psychiatric: He has a normal mood and affect. His behavior is normal. Judgment and thought content normal.    Results for orders placed or performed during the hospital encounter of  02/01/17 (from the past 48 hour(s))  Glucose, capillary     Status: Abnormal   Collection Time: 02/01/17  4:54 PM  Result Value Ref Range   Glucose-Capillary 359 (H) 65 - 99 mg/dL  Glucose, capillary     Status: Abnormal   Collection Time: 02/01/17  5:45 PM  Result Value Ref Range   Glucose-Capillary 326 (H) 65 - 99 mg/dL  Glucose, capillary     Status: Abnormal   Collection Time: 02/01/17  6:19 PM  Result Value Ref Range   Glucose-Capillary 319 (H) 65 - 99 mg/dL   Comment 1 Notify RN    Comment 2 Document in Chart   Glucose, capillary     Status: Abnormal   Collection Time: 02/01/17  9:40 PM  Result Value Ref Range   Glucose-Capillary 264 (H) 65 - 99 mg/dL   Comment 1 Notify RN    Comment 2 Document in Chart   Glucose, capillary     Status: Abnormal   Collection Time: 02/02/17  1:58 AM  Result Value Ref Range   Glucose-Capillary 167 (H) 65 - 99 mg/dL  Glucose, capillary     Status: None   Collection Time: 02/02/17  6:37 AM  Result Value Ref Range   Glucose-Capillary 76 65 - 99 mg/dL  Glucose, capillary     Status: Abnormal   Collection Time: 02/02/17 11:17 AM  Result Value Ref Range   Glucose-Capillary 137 (H) 65 - 99 mg/dL   Comment 1 Notify RN    Comment 2 Document in Chart   Glucose, capillary     Status: None   Collection Time: 02/02/17  4:45 PM  Result Value Ref Range   Glucose-Capillary 99 65 - 99 mg/dL   Comment 1 Notify RN    Comment 2 Document in Chart   Glucose, capillary     Status: None   Collection Time: 02/02/17  9:04 PM  Result Value Ref Range   Glucose-Capillary 97 65 - 99 mg/dL   Comment 1 Notify RN    Comment 2 Document in Chart   Glucose, capillary     Status: Abnormal   Collection Time: 02/03/17  2:36 AM  Result Value Ref Range   Glucose-Capillary 39 (LL) 65 - 99 mg/dL   Comment 1 Notify RN   Glucose, capillary     Status: Abnormal   Collection Time: 02/03/17  3:06 AM  Result Value Ref Range   Glucose-Capillary 37 (LL) 65 - 99 mg/dL    Comment 1 Notify RN   Glucose, capillary     Status: Abnormal   Collection Time: 02/03/17  3:42 AM  Result Value Ref Range   Glucose-Capillary 184 (H) 65 - 99 mg/dL  Glucose, capillary     Status: Abnormal   Collection Time: 02/03/17  6:10 AM  Result Value Ref Range   Glucose-Capillary 150 (H) 65 - 99 mg/dL  Glucose, capillary     Status: Abnormal   Collection Time: 02/03/17 11:21 AM  Result Value Ref Range   Glucose-Capillary 183 (H) 65 - 99 mg/dL   Comment 1 Notify RN    Comment 2 Document in Chart    Dg Cervical Spine 2-3 Views  Result Date: 02/01/2017 CLINICAL DATA:  C3-C6 ACDF. EXAM: CERVICAL SPINE - 2-3 VIEW; DG C-ARM 61-120 MIN COMPARISON:  MRI cervical spine dated December 31, 2016. FINDINGS: Several lateral intraoperative x-rays were obtained. The first x-ray demonstrates surgical instrumentation in the C3-C4 disc space. The remaining x-rays demonstrate interval C3-C6 ACDF with interbody disc spacers. No evidence of hardware complication. Alignment is normal. IMPRESSION: 1. Interval C3-C6 ACDF. FLUOROSCOPY TIME:  9 seconds. C-arm fluoroscopic  images were obtained intraoperatively and submitted for post operative interpretation. Electronically Signed   By: Titus Dubin M.D.   On: 02/01/2017 16:37   Dg C-arm 1-60 Min  Result Date: 02/01/2017 CLINICAL DATA:  C3-C6 ACDF. EXAM: CERVICAL SPINE - 2-3 VIEW; DG C-ARM 61-120 MIN COMPARISON:  MRI cervical spine dated December 31, 2016. FINDINGS: Several lateral intraoperative x-rays were obtained. The first x-ray demonstrates surgical instrumentation in the C3-C4 disc space. The remaining x-rays demonstrate interval C3-C6 ACDF with interbody disc spacers. No evidence of hardware complication. Alignment is normal. IMPRESSION: 1. Interval C3-C6 ACDF. FLUOROSCOPY TIME:  9 seconds. C-arm fluoroscopic images were obtained intraoperatively and submitted for post operative interpretation. Electronically Signed   By: Titus Dubin M.D.   On:  02/01/2017 16:37       Medical Problem List and Plan: 1. Weakness, limitations with ADLs secondary to demyelinating myeloneuropathy and cervical stenosis with cord compression--s/p ACDF  C3-C5 2. DVT Prophylaxis/Anticoagulation: Pharmaceutical: Lovenox 3. Pain Management:              Flexeril PRN for muscle spasms              Gabapentin  600-- 3 times a day  4. Mood: Remains optimistic. LCSW to follow for evaluation and support.  5. Neuropsych: This patient is capable of making decisions on his own behalf. 6. Skin/Wound Care: Routine pressure relief measures. Maintain adequate nutritional and hydration status.  7. Fluids/Electrolytes/Nutrition: Monitor I/Os. Added nutritional supplement to help promote healing and for BS stabilization.  8.T2DM: Hgb A1c- 7.3 (managed by Dr. Loanne Drilling) Monitor BS ac/hs. Has insulin pump--continue lantus with meal coverage and bolus according to intake.     9. BPH/Neurogenic bladder: Monitor voiding with PVR checks. Continue finasteride and flomax. Cath for volumes > 400 cc and work on bowel regimen 10 HTN: Monitor BP bid 11. Anxiety disorder: Managed on Buspar. 12. Hyponatremia: Stable--recheck in am.       13. Neutropenia: Question due to IVIG. Recheck in am. 14. Dehydration: encourage fluid intake.                                        Delice Lesch, MD, ABP[facility], [place] 02/03/2017

## 2017-02-04 ENCOUNTER — Inpatient Hospital Stay (HOSPITAL_COMMUNITY): Payer: BLUE CROSS/BLUE SHIELD

## 2017-02-04 ENCOUNTER — Inpatient Hospital Stay (HOSPITAL_COMMUNITY): Payer: BLUE CROSS/BLUE SHIELD | Admitting: Occupational Therapy

## 2017-02-04 DIAGNOSIS — G8918 Other acute postprocedural pain: Secondary | ICD-10-CM

## 2017-02-04 DIAGNOSIS — G959 Disease of spinal cord, unspecified: Secondary | ICD-10-CM

## 2017-02-04 DIAGNOSIS — E119 Type 2 diabetes mellitus without complications: Secondary | ICD-10-CM

## 2017-02-04 DIAGNOSIS — N319 Neuromuscular dysfunction of bladder, unspecified: Secondary | ICD-10-CM

## 2017-02-04 DIAGNOSIS — E871 Hypo-osmolality and hyponatremia: Secondary | ICD-10-CM

## 2017-02-04 DIAGNOSIS — I10 Essential (primary) hypertension: Secondary | ICD-10-CM

## 2017-02-04 DIAGNOSIS — F411 Generalized anxiety disorder: Secondary | ICD-10-CM

## 2017-02-04 DIAGNOSIS — R338 Other retention of urine: Secondary | ICD-10-CM

## 2017-02-04 DIAGNOSIS — N401 Enlarged prostate with lower urinary tract symptoms: Secondary | ICD-10-CM

## 2017-02-04 LAB — CBC WITH DIFFERENTIAL/PLATELET
Basophils Absolute: 0 10*3/uL (ref 0.0–0.1)
Basophils Relative: 0 %
EOS PCT: 4 %
Eosinophils Absolute: 0.3 10*3/uL (ref 0.0–0.7)
HCT: 40.6 % (ref 39.0–52.0)
Hemoglobin: 13.6 g/dL (ref 13.0–17.0)
LYMPHS ABS: 1.5 10*3/uL (ref 0.7–4.0)
LYMPHS PCT: 20 %
MCH: 30 pg (ref 26.0–34.0)
MCHC: 33.5 g/dL (ref 30.0–36.0)
MCV: 89.6 fL (ref 78.0–100.0)
MONO ABS: 1.1 10*3/uL — AB (ref 0.1–1.0)
Monocytes Relative: 15 %
Neutro Abs: 4.6 10*3/uL (ref 1.7–7.7)
Neutrophils Relative %: 61 %
PLATELETS: 173 10*3/uL (ref 150–400)
RBC: 4.53 MIL/uL (ref 4.22–5.81)
RDW: 13.1 % (ref 11.5–15.5)
WBC: 7.6 10*3/uL (ref 4.0–10.5)

## 2017-02-04 LAB — GLUCOSE, CAPILLARY
GLUCOSE-CAPILLARY: 105 mg/dL — AB (ref 65–99)
GLUCOSE-CAPILLARY: 52 mg/dL — AB (ref 65–99)
GLUCOSE-CAPILLARY: 204 mg/dL — AB (ref 65–99)
GLUCOSE-CAPILLARY: 112 mg/dL — AB (ref 65–99)
Glucose-Capillary: 65 mg/dL (ref 65–99)

## 2017-02-04 LAB — COMPREHENSIVE METABOLIC PANEL
ALT: 25 U/L (ref 17–63)
AST: 23 U/L (ref 15–41)
Albumin: 3.3 g/dL — ABNORMAL LOW (ref 3.5–5.0)
Alkaline Phosphatase: 134 U/L — ABNORMAL HIGH (ref 38–126)
Anion gap: 6 (ref 5–15)
BUN: 17 mg/dL (ref 6–20)
CALCIUM: 8.8 mg/dL — AB (ref 8.9–10.3)
CHLORIDE: 100 mmol/L — AB (ref 101–111)
CO2: 28 mmol/L (ref 22–32)
CREATININE: 0.81 mg/dL (ref 0.61–1.24)
Glucose, Bld: 105 mg/dL — ABNORMAL HIGH (ref 65–99)
Potassium: 3.6 mmol/L (ref 3.5–5.1)
Sodium: 134 mmol/L — ABNORMAL LOW (ref 135–145)
TOTAL PROTEIN: 7.2 g/dL (ref 6.5–8.1)
Total Bilirubin: 1.3 mg/dL — ABNORMAL HIGH (ref 0.3–1.2)

## 2017-02-04 MED ORDER — TAMSULOSIN HCL 0.4 MG PO CAPS
0.4000 mg | ORAL_CAPSULE | Freq: Every day | ORAL | Status: DC
  Administered 2017-02-04 – 2017-02-11 (×8): 0.4 mg via ORAL
  Filled 2017-02-04 (×8): qty 1

## 2017-02-04 MED ORDER — PREMIER PROTEIN SHAKE
11.0000 [oz_av] | Freq: Three times a day (TID) | ORAL | Status: DC
  Administered 2017-02-04 – 2017-02-12 (×21): 11 [oz_av] via ORAL
  Filled 2017-02-04 (×41): qty 325.31

## 2017-02-04 MED ORDER — METHOCARBAMOL 750 MG PO TABS
750.0000 mg | ORAL_TABLET | Freq: Four times a day (QID) | ORAL | Status: DC
  Administered 2017-02-04 – 2017-02-05 (×4): 750 mg via ORAL
  Filled 2017-02-04 (×5): qty 1

## 2017-02-04 MED ORDER — BETHANECHOL CHLORIDE 10 MG PO TABS
10.0000 mg | ORAL_TABLET | Freq: Four times a day (QID) | ORAL | Status: DC
  Administered 2017-02-04 – 2017-02-07 (×10): 10 mg via ORAL
  Filled 2017-02-04 (×11): qty 1

## 2017-02-04 NOTE — IPOC Note (Signed)
Patient is an interrupted stay, please see previous IPOC. Since that time patient has had cervical decompression.

## 2017-02-04 NOTE — IPOC Note (Deleted)
Patient is an interrupted stay, please see previous IPOC. Since that time patient has had cervical decompression.

## 2017-02-04 NOTE — Evaluation (Signed)
Occupational Therapy Assessment and Plan  Patient Details  Name: Kenneth Pace MRN: [id number] Date of Birth: 1950/03/21  OT Diagnosis: acute pain, muscle weakness (generalized), pain in joint and quadriparesis  Rehab Potential: Rehab Potential (ACUTE ONLY): Excellent ELOS: 5-7 days   Today's Date: 02/04/2017 OT Individual Time: 1330-1445 OT Individual Time Calculation (min): 75 min     Problem List:  Patient Active Problem List   Diagnosis Date Noted  . Postoperative pain   . Diabetes mellitus type 2 in nonobese (HCC)   . Hyponatremia   . Cervical myelopathy ([place]) 02/01/2017  . Shortness of breath at rest   . Hypoglycemia   . Spondylosis, cervical, with myelopathy   . Neuropathic pain   . Benign essential HTN   . Labile blood glucose   . Muscle spasm   . Type 2 diabetes mellitus with peripheral neuropathy (HCC)   . Generalized anxiety disorder   . Acute inflammatory demyelinating polyneuropathy ([place]) 01/11/2017  . Anxiety state   . Neurogenic bladder   . Depression 12/30/2016  . Leg weakness, bilateral 12/30/2016  . 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 GBS (Guillain Barre syndrome) ([place])   . AIDP (acute inflammatory demyelinating polyneuropathy) ([place]) 11/27/2016  . Weakness 11/20/2016  . Weakness of both arms 10/30/2016  . Numbness 03/18/2016  . Diabetes mellitus without complication ([place]) 31/49/7026  . Eustachian tube dysfunction 02/12/2015  . Acute maxillary sinusitis 02/12/2015  . Wellness examination 08/22/2014  . Alkaline phosphatase elevation 08/22/2014  . Screening for prostate cancer 04/24/2013  . Routine general medical examination at a health care facility 04/10/2012  . BPH (benign prostatic hyperplasia) 02/03/2010  . HEMOCCULT POSITIVE STOOL 05/25/2008  . ELBOW PAIN, RIGHT 07/04/2007  . Dyslipidemia 02/16/2007  . ANXIETY 02/16/2007  . ERECTILE DYSFUNCTION 02/16/2007  . Essential hypertension 02/16/2007  .  TESTICULAR MASS, LEFT 02/16/2007  . KNEE PAIN, CHRONIC 02/16/2007  . ADENOIDECTOMY, HX OF 02/16/2007    Past Medical History:  Past Medical History:  Diagnosis Date  . ADENOIDECTOMY, HX OF 02/16/2007  . Anxiety    pt. states he does not have anxiety.  Marland Kitchen BENIGN PROSTATIC HYPERTROPHY, WITH OBSTRUCTION 02/03/2010  . DIABETES MELLITUS, TYPE I, UNCONTROLLED 12/29/2006  . ERECTILE DYSFUNCTION 02/16/2007  . HYPERLIPIDEMIA 02/16/2007  . HYPERTENSION 02/16/2007  . TESTICULAR MASS, LEFT 02/16/2007   Past Surgical History:  Past Surgical History:  Procedure Laterality Date  . ANTERIOR CERVICAL DECOMP/DISCECTOMY FUSION N/A 02/01/2017   Procedure: ANTERIOR CERVICAL DECOMPRESSION/DISCECTOMY FUSION CERVICAL THREE-FOUR , CERVICAL FOUR-FIVE  CERVICAL FIVE-SIX;  Surgeon: Consuella Lose, MD;  Location: [facility];  Service: Neurosurgery;  Laterality: N/A;  . KNEE SURGERY     2 Lknee arthroscopy, 3 R knee arthroscpoy  . KNEE SURGERY Right 11/12/2016  . TONSILLECTOMY      Assessment & Plan Clinical Impression:   is a 66 y.o.malewith history of T2DM, OSA,,subacute inflammatory polyradiculoneuropathy treated withIVIG for lower extremity weakness10/2018. He was eadmitted on 12/29/16 withreports of progressive decline withreports oftightness around abdomen with difficulty breathing,progressive difficulty walking with numbness,difficulty swallowing and inability to void. Dr. Rory Percy felt thattetraplegialikely due todue to demyelinating myeloneuropathy.MRI ofbrain/cervical spinerepeated and was negative foracute intracranial abnormality but showedmultilevel moderate stenosis C3/4 and C5/6 with mild cord deformity but no signal changes. Dr. Kathyrn Pace consulted and recommended trial of plasma exchange as symptoms favor AIDPand toconsider decompression of cervical spondylosisif no improvement noted.He completed 5 Zuriah Bordas of treatment andwasshowing some improvement.Therapy ongoing and CIR  was recommended due to functional deficits. He was  admitted to rehab 11/26/2018for intensive rehab program qa npatient therapies to consist of PT and OT at least three hours five days a week. Past admission physiatrist, therapy team and rehab RN have worked together to provide customized collaborative inpatient rehab. He has been afebrile during his stay and blood pressures have been stable. Diabetes has been monitored with ac/hs checks as well as his continuous dermal monitor. He has had some issues with hypo and hyperglycemia and has been adjusting insulin according to intake as well as activity. He continues to require I/O caths due to ongoing issues with urinary retention. Constipation has resolved with set up of bowel program. He continued to have issues with neuropathic symptoms and gabapentin was titrated up to 600 mg tid without side effects. Mood has been stable and he has shown good motivation with therapy. As therapy progressed, he started developing decrease in activity tolerance with extreme fatigue by the end of the day. His wife expressed concerns about his rate of recovery and neurology felt that it was reasonable try another round of IVIG but also expressed concerns that cervical stenosis may be contributing to his symptoms.  He did not imporve with course of IVIG X 3 Laira Penninger therefore NS was consulted and recommended decompressive surgery. He underwent ACDF C3-C5 on 02/01/17 by Dr. Kathyrn Pace and has had improvement in symptoms. He is being admitted back to CIR after an interrupted stay to resume his rehab course. Patient transferred to CIR on 02/03/2017 .    Patient currently requires min with basic self-care skills secondary to muscle weakness and muscle paralysis, decreased cardiorespiratoy endurance, ataxia and decreased standing balance and decreased postural control.  Prior to hospitalization, patient could complete ADLs/IADLs with independent .  Patient will benefit from skilled  intervention to decrease level of assist with basic self-care skills, increase independence with basic self-care skills and increase level of independence with iADL prior to discharge home with care partner.  Anticipate patient will require intermittent supervision and follow up outpatient.  OT - End of Session Activity Tolerance: Tolerates 10 - 20 min activity with multiple rests Endurance Deficit: Yes Endurance Deficit Description: requires seated rest break after short duration mobility activities OT Assessment Rehab Potential (ACUTE ONLY): Excellent OT Patient demonstrates impairments in the following area(s): Balance;Motor;Sensory;Pain;Edema;Endurance;Safety OT Basic ADL's Functional Problem(s): Eating;Grooming;Bathing;Dressing;Toileting OT Transfers Functional Problem(s): Toilet;Tub/Shower OT Additional Impairment(s): Fuctional Use of Upper Extremity OT Plan OT Intensity: Minimum of 1-2 x/day, 45 to 90 minutes OT Frequency: 5 out of 7 days OT Duration/Estimated Length of Stay: 5-7 days OT Treatment/Interventions: Balance/vestibular training;Community reintegration;Discharge planning;Functional mobility training;Neuromuscular re-education;Patient/family education;Self Care/advanced ADL retraining;Pain management;Psychosocial support;Skin care/wound managment;Therapeutic Activities;Therapeutic Exercise;UE/LE Strength taining/ROM;UE/LE Coordination activities;Functional electrical stimulation OT Self Feeding Anticipated Outcome(s): Mod I OT Basic Self-Care Anticipated Outcome(s): Mod I OT Toileting Anticipated Outcome(s): Mod I OT Bathroom Transfers Anticipated Outcome(s): Mod I OT Recommendation Recommendations for Other Services: Therapeutic Recreation consult Therapeutic Recreation Interventions: Pet therapy;Kitchen group;Stress management;Outing/community reintergration Patient destination: Home Follow Up Recommendations: Outpatient OT Equipment Recommended: To be  determined Equipment Details: Pt has tub transfer bench   Skilled Therapeutic Intervention Pt seen for OT eval and ADL session. Pt sitting up in w/c upon arrival, agreeable to tx session. He declined bathing this session. Completed grooming tasks at sink, able to manipulate self care items with slightly increased time. He dressed from w/c level, steadying assist while standing to pull pants up. Ambulated throughout room with RW and CGA to complete toilet transfer and simulated tub/shower transfer utilizing tub  bench with assist for management of L LE over tub wall when exiting shower.  Ambulated in hallway with HHA, mod A overall and pt with narrow BOS and scissoring like steps.  Pt returned to room at end of session, returned to supine with assist to manage B LEs onto bed. Pt left in supine with all needs in reach, bed alarm on and visitor entering. Pt educated throughout session regarding role of OT, POC, OT/PT goals, ELOS, and d/c planning.  Completed 9 hole peg test, see results below.   OT Evaluation Precautions/Restrictions  Precautions Precautions: Fall;Cervical Precaution Comments: Cervical precautions; Aspen collar on when ambulating, up/OOB and sitting up in chair. May remove to eat, bathe or walk to BR. Required Braces or Orthoses: Cervical Brace Cervical Brace: Hard collar Restrictions Weight Bearing Restrictions: No General Chart Reviewed: Yes Vital Signs  Pain   Home Living/Prior Functioning Home Living Family/patient expects to be discharged to:: Private residence Living Arrangements: Spouse/significant other Available Help at Discharge: Family, Available 24 hours/day Type of Home: House Home Access: Stairs to enter [employer] of Steps: 2 Entrance Stairs-Rails: Can reach both, Left, Right Home Layout: Two level, Bed/bath upstairs, Able to live on main level with bedroom/bathroom Alternate Level Stairs-Number of Steps: 17 Alternate Level Stairs-Rails:  Can reach both Bathroom Shower/Tub: Tub/shower unit, Industrial/product designer: Yes  Lives With: Spouse IADL History Current License: Yes Occupation: Full time employment Type of Occupation: Works for [employer] Prior Function Level of Independence: Independent with basic ADLs, Independent with homemaking with ambulation, Independent with gait, Independent with transfers  Able to Take Stairs?: Yes Driving: Yes Vocation: Full time employment Leisure: Hobbies-yes (Comment) 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[employer]; also works on cars at [employer] Vision/History: Wears glasses Wears Glasses: At all times Patient Visual Report: No change from baseline Vision Assessment?: No apparent visual deficits Perception  Perception: Within Functional Limits Praxis Praxis: Intact Cognition Overall Cognitive Status: Within Functional Limits for tasks assessed Arousal/Alertness: Awake/alert Orientation Level: Person;Place;Situation Person: Oriented Place: Oriented Situation: Oriented Year: 2018 Month: December Day of Week: Incorrect Memory: Appears intact Immediate Memory Recall: Sock;Blue;Bed Memory Recall: Sock;Blue;Bed Memory Recall Sock: Without Cue Memory Recall Blue: Without Cue Memory Recall Bed: Without Cue Awareness: Appears intact Problem Solving: Appears intact Safety/Judgment: Appears intact Sensation Sensation Light Touch: Impaired Detail Light Touch Impaired Details: Impaired RUE;Impaired LUE(Pt reports decreased sensation B hands, ulnar>radial nerve distribution area) Stereognosis: Appears Intact Proprioception: Appears Intact Coordination Gross Motor Movements are Fluid and Coordinated: No Fine Motor Movements are Fluid and Coordinated: No Coordination and Movement Description: Ataxic with decreased coordination and weakness Finger Nose Finger Test: [facility] 9 Hole Peg Test: R: 1 min 3 sec, 49.45 sec, and 44 sec     L:  53.9 sec, 43.6 sec, and 37.12 sec Motor  Motor Motor: Within Functional Limits Mobility  Bed Mobility Bed Mobility: Rolling Right;Rolling Left;Left Sidelying to Sit;Sit to Supine Rolling Right: 5: Supervision Rolling Right Details: Verbal cues for technique Rolling Left: 5: Supervision Rolling Left Details: Verbal cues for technique Left Sidelying to Sit: 3: Mod assist Left Sidelying to Sit Details: Manual facilitation for weight shifting;Manual facilitation for placement;Verbal cues for technique Supine to Sit: HOB elevated Supine to Sit Details: Verbal cues for precautions/safety;Verbal cues for technique;Manual facilitation for weight bearing;Manual facilitation for weight shifting;Manual facilitation for placement Sit to Supine: 4: Min assist Sit to Supine - Details: Manual facilitation for placement;Verbal cues for technique Transfers Sit to Stand:  4: Min assist Sit to Stand Details: Manual facilitation for weight shifting Stand to Sit: 4: Min assist Stand to Sit Details (indicate cue type and reason): Manual facilitation for weight shifting  Trunk/Postural Assessment  Cervical Assessment Cervical Assessment: Exceptions to [facility] Cervical AROM Overall Cervical AROM Comments: cervical precautions; hard collar with mobility Thoracic Assessment Thoracic Assessment: Within Functional Limits Lumbar Assessment Lumbar Assessment: Exceptions to WFL(Posterior pelvic tilt) Postural Control Postural Control: Within Functional Limits Trunk Control: trunk weakness results in posterior pelvic tilt  Balance Balance Balance Assessed: Yes Static Sitting Balance Static Sitting - Balance Support: Feet supported Static Sitting - Level of Assistance: 5: Stand by assistance Dynamic Sitting Balance Dynamic Sitting - Balance Support: During functional activity Dynamic Sitting - Level of Assistance: 5: Stand by assistance;4: Min assist Sitting balance - Comments: Sitting to complete dressing from  w/c level Static Standing Balance Static Standing - Balance Support: During functional activity;No upper extremity supported Static Standing - Level of Assistance: 4: Min assist Static Standing - Comment/# of Minutes: Standing to complete bathing/dressing Dynamic Standing Balance Dynamic Standing - Balance Support: During functional activity Dynamic Standing - Level of Assistance: 4: Min assist Dynamic Standing - Balance Activities: Forward lean/weight shifting Extremity/Trunk Assessment RUE Assessment RUE Assessment: Exceptions to WFL(Shoulder flexion limited to 90 degrees due to pain; all other joints WFL) RUE Strength RUE Overall Strength: (shoulder strength not assessed due to cervial pre-cautions; elbow flexion/extension 4/5) LUE Assessment LUE Assessment: Exceptions to WFL(shoulder strength not assessed due to cervial pre-cautions; elbow flexion/extension 4/5)   See Function Navigator for Current Functional Status.   Refer to Care Plan for Long Term Goals  Recommendations for other services: Therapeutic Recreation  Pet therapy, Kitchen group and Outing/community reintegration   Discharge Criteria: Patient will be discharged from OT if patient refuses treatment 3 consecutive times without medical reason, if treatment goals not met, if there is a change in medical status, if patient makes no progress towards goals or if patient is discharged from hospital.  The above assessment, treatment plan, treatment alternatives and goals were discussed and mutually agreed upon: by patient  Seann Genther L 02/04/2017, 3:11 PM

## 2017-02-04 NOTE — H&P (Signed)
Chief Complaint  Weakness  History of Present Illness  Kenneth Pace is a 66 y.o. male being admitted for cervical decompression. He has a complicated history but has not improved with treatment for his Guillain-Barre syndrome. He has concomitant cervical stenosis and therefore decompression is indicated.  Past Medical History   Past Medical History:  Diagnosis Date  . ADENOIDECTOMY, HX OF 02/16/2007  . Anxiety    pt. states he does not have anxiety.  Marland Kitchen BENIGN PROSTATIC HYPERTROPHY, WITH OBSTRUCTION 02/03/2010  . DIABETES MELLITUS, TYPE I, UNCONTROLLED 12/29/2006  . ERECTILE DYSFUNCTION 02/16/2007  . HYPERLIPIDEMIA 02/16/2007  . HYPERTENSION 02/16/2007  . TESTICULAR MASS, LEFT 02/16/2007    Past Surgical History   Past Surgical History:  Procedure Laterality Date  . ANTERIOR CERVICAL DECOMP/DISCECTOMY FUSION N/A 02/01/2017   Procedure: ANTERIOR CERVICAL DECOMPRESSION/DISCECTOMY FUSION CERVICAL THREE-FOUR , CERVICAL FOUR-FIVE  CERVICAL FIVE-SIX;  Surgeon: Consuella Lose, MD;  Location: [facility];  Service: Neurosurgery;  Laterality: N/A;  . KNEE SURGERY     2 Lknee arthroscopy, 3 R knee arthroscpoy  . KNEE SURGERY Right 11/12/2016  . TONSILLECTOMY      Social History   Social History   Tobacco Use  . Smoking status: Never Smoker  . Smokeless tobacco: Never Used  Substance Use Topics  . Alcohol use: No  . Drug use: No    Medications   Prior to Admission medications   Medication Sig Start Date End Date Taking? Authorizing Provider  acetaminophen (TYLENOL) 325 MG tablet Take 2 tablets (650 mg total) by mouth every 6 (six) hours as needed for mild pain or headache. 11/24/16   Mikhail, Velta Addison, DO  busPIRone (BUSPAR) 30 MG tablet TAKE ONE-HALF (1/2) TABLET (15 MG) TWICE A DAY Patient taking differently: 30MG BY MOUTH AT BEDTIME 02/28/15   Renato Shin, MD  co-enzyme Q-10 30 MG capsule Take 30 mg by mouth daily.      [provider]  Continuous Glucose  Monitor Sup (ENLITE GLUCOSE SENSOR) MISC 1 Device by Does not apply route every 3 (three) days. 03/18/16   Renato Shin, MD  Cranberry 400 MG TABS Take 2 each by mouth daily.      [provider]  Cyanocobalamin (VITAMIN B-12 CR PO) Take 1 each by mouth daily.      [provider]  finasteride (PROSCAR) 5 MG tablet Take 5 mg by mouth daily.     [provider]  gabapentin (NEURONTIN) 600 MG tablet Take 1 tablet (600 mg total) by mouth 3 (three) times daily. 02/03/17   Costella, Vista Mink, PA-C  glucagon 1 MG injection Inject 1 mg into the vein once as needed. Patient taking differently: Inject 1 mg once as needed into the vein (severe hypoglycemia).  07/25/13   Renato Shin, MD  glucose blood (BAYER CONTOUR NEXT TEST) test strip Use to check blood sugar 5 times per day dx 250.01, 03/18/16   Renato Shin, MD  HUMALOG 100 UNIT/ML injection INJECT 120 UNITS DAILY IN PUMP 10/26/16   Renato Shin, MD  Insulin Infusion Pump Supplies (PARADIGM RESERVOIR 3ML) MISC 1 Device by Does not apply route every 3 (three) days. 01/21/16   Renato Shin, MD  Insulin Infusion Pump Supplies (QUICK-SET INFUSION 43" 9MM) MISC 1 Device by Does not apply route every 3 (three) days. 03/18/16   Renato Shin, MD  Lancets Misc. (ACCU-CHEK MULTICLIX LANCET DEV) KIT 1 Device by Other route 5 (five) times daily as needed. 5/day dx 250.01 01/03/15   Loanne Drilling,  Hilliard Clark, MD  lisinopril-hydrochlorothiazide (PRINZIDE,ZESTORETIC) 10-12.5 MG tablet TAKE 1 TABLET DAILY 10/04/16   Renato Shin, MD  methocarbamol (ROBAXIN) 500 MG tablet Take 1 tablet (500 mg total) by mouth every 6 (six) hours as needed for muscle spasms. 02/03/17   Costella, Vista Mink, PA-C  Misc. Devices MISC IV 3000 infusion set cover.  Change every 3 days 12/08/12   Renato Shin, MD  Multiple Vitamins-Minerals (MULTIVITAMIN,TX-MINERALS) tablet Take 1 tablet by mouth daily.      [provider]  oxyCODONE-acetaminophen (PERCOCET) 7.5-325 MG  tablet Take 1 tablet by mouth every 4 (four) hours as needed for severe pain. 02/03/17   Costella, Vista Mink, PA-C    Allergies  No Known Allergies  Review of Systems  ROS  Neurologic Exam  Awake, alert, oriented Memory and concentration grossly intact Speech fluent, appropriate CN grossly intact Motor exam: Upper Extremities Deltoid Bicep Tricep Grip  Right 5/5 5/5 5/5 5/5  Left 5/5 5/5 5/5 5/5   Lower Extremities IP Quad PF DF EHL  Right 5/5 5/5 5/5 5/5 5/5  Left 5/5 5/5 5/5 5/5 5/5   Sensation grossly intact to LT  Imaging  MRI shows straightening of cervical lordosis with disc bulge and resultant stenosis at C3-4, C4-5, C5-6.  Impression  - 66 y.o. male with progressive myelopathy likely at least in part due to cervical stenosis  Plan  - Will proceed with ACDF at C3-4, C4-5, C5-6.

## 2017-02-04 NOTE — Evaluation (Addendum)
Physical Therapy Assessment and Plan  Patient Details  Name: Kenneth Pace MRN: [id number] Date of Birth: 05/18/1950  PT Diagnosis: Abnormality of gait, Impaired sensation, Muscle weakness and Pain in neck Rehab Potential: Excellent ELOS: 7-9   Today's Date: 02/04/2017 PT Individual Time: 4481-8563 PT Individual Time Calculation (min): 80 min    Problem List:  Patient Active Problem List   Diagnosis Date Noted  . Postoperative pain   . Diabetes mellitus type 2 in nonobese (HCC)   . Hyponatremia   . Cervical myelopathy ([place]) 02/01/2017  . Shortness of breath at rest   . Hypoglycemia   . Spondylosis, cervical, with myelopathy   . Neuropathic pain   . Benign essential HTN   . Labile blood glucose   . Muscle spasm   . Type 2 diabetes mellitus with peripheral neuropathy (HCC)   . Generalized anxiety disorder   . Acute inflammatory demyelinating polyneuropathy ([place]) 01/11/2017  . Anxiety state   . Neurogenic bladder   . Depression 12/30/2016  . Leg weakness, bilateral 12/30/2016  . 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 GBS (Guillain Barre syndrome) ([place])   . AIDP (acute inflammatory demyelinating polyneuropathy) ([place]) 11/27/2016  . Weakness 11/20/2016  . Weakness of both arms 10/30/2016  . Numbness 03/18/2016  . Diabetes mellitus without complication ([place]) 14/97/0263  . Eustachian tube dysfunction 02/12/2015  . Acute maxillary sinusitis 02/12/2015  . Wellness examination 08/22/2014  . Alkaline phosphatase elevation 08/22/2014  . Screening for prostate cancer 04/24/2013  . Routine general medical examination at a health care facility 04/10/2012  . BPH (benign prostatic hyperplasia) 02/03/2010  . HEMOCCULT POSITIVE STOOL 05/25/2008  . ELBOW PAIN, RIGHT 07/04/2007  . Dyslipidemia 02/16/2007  . ANXIETY 02/16/2007  . ERECTILE DYSFUNCTION 02/16/2007  . Essential hypertension 02/16/2007  . TESTICULAR MASS, LEFT 02/16/2007  . KNEE  PAIN, CHRONIC 02/16/2007  . ADENOIDECTOMY, HX OF 02/16/2007    Past Medical History:  Past Medical History:  Diagnosis Date  . ADENOIDECTOMY, HX OF 02/16/2007  . Anxiety    pt. states he does not have anxiety.  Marland Kitchen BENIGN PROSTATIC HYPERTROPHY, WITH OBSTRUCTION 02/03/2010  . DIABETES MELLITUS, TYPE I, UNCONTROLLED 12/29/2006  . ERECTILE DYSFUNCTION 02/16/2007  . HYPERLIPIDEMIA 02/16/2007  . HYPERTENSION 02/16/2007  . TESTICULAR MASS, LEFT 02/16/2007   Past Surgical History:  Past Surgical History:  Procedure Laterality Date  . ANTERIOR CERVICAL DECOMP/DISCECTOMY FUSION N/A 02/01/2017   Procedure: ANTERIOR CERVICAL DECOMPRESSION/DISCECTOMY FUSION CERVICAL THREE-FOUR , CERVICAL FOUR-FIVE  CERVICAL FIVE-SIX;  Surgeon: Consuella Lose, MD;  Location: [facility];  Service: Neurosurgery;  Laterality: N/A;  . KNEE SURGERY     2 Lknee arthroscopy, 3 R knee arthroscpoy  . KNEE SURGERY Right 11/12/2016  . TONSILLECTOMY      Assessment & Plan Clinical Impression:Kenneth G Weatherlyis a 66 y.o.malewith history of T2DM, OSA,,subacute inflammatory polyradiculoneuropathy treated withIVIG for lower extremity weakness10/2018. He was eadmitted on 12/29/16 withreports of progressive decline withreports oftightness around abdomen with difficulty breathing,progressive difficulty walking with numbness,difficulty swallowing and inability to void. Dr. Rory Percy felt thattetraplegialikely due todue to demyelinating myeloneuropathy.MRI ofbrain/cervical spinerepeated and was negative foracute intracranial abnormality but showedmultilevel moderate stenosis C3/4 and C5/6 with mild cord deformity but no signal changes. Dr. Kathyrn Sheriff consulted and recommended trial of plasma exchange as symptoms favor AIDPand toconsider decompression of cervical spondylosisif no improvement noted.He completed 5 rounds of treatment andwasshowing some improvement.Therapy ongoing and CIR was recommended due to  functional deficits.   He was admitted to rehab 11/26/2018for intensive  rehab program qa npatient therapies to consist of PT and OT at least three hours five days a week. Past admission physiatrist, therapy team and rehab RN have worked together to provide customized collaborative inpatient rehab. He has been afebrile during his stay and blood pressures have been stable. Diabetes has been monitored with ac/hs checks as well as his continuous dermal monitor. He has had some issues with hypo and hyperglycemia and has been adjusting insulin according to intake as well as activity. He continues to require I/O caths due to ongoing issues with urinary retention. Constipation has resolved with set up of bowel program. He continued to have issues with neuropathic symptoms and gabapentin was titrated up to 600 mg tid without side effects. Mood has been stable and he has shown good motivation with therapy. As therapy progressed, he started developing decrease in activity tolerance with extreme fatigue by the end of the day. His wife expressed concerns about his rate of recovery and neurology felt that it was reasonable try another round of IVIG but also expressed concerns that cervical stenosis may be contributing to his symptoms.  He did not imporve with course of IVIG X 3 rounds therefore NS was consulted and recommended decompressive surgery. He underwent ACDF C3-C5 on 02/01/17 by Dr. Kathyrn Sheriff and has had improvement in symptoms. He is being admitted back to CIR after an interrupted stay to resume his rehab course.     Patient transferred to CIR on 02/03/2017 .   Patient currently requires mod with mobility secondary to decreased cardiorespiratoy endurance, impaired timing and sequencing and decreased standing balance, decreased balance strategies and difficulty maintaining precautions.  Prior to cervical surgery,  patient was at a  moderate assistance level and unable to ambulate > 4' in parallel bars,  at CIR. Pt  lives with Spouse in a House home.  Home access is 2Stairs to enter, with 2 rails which he can reach at same time.  Pt has bed and bath he can access on entry level; 17 steps to 2nd level of home.   Patient will benefit from skilled PT intervention to maximize safe functional mobility, minimize fall risk and decrease caregiver burden for planned discharge home with intermittent assist.  Anticipate patient will benefit from follow up OP PT at discharge.  PT - End of Session Activity Tolerance: Tolerates 30+ min activity with multiple rests Endurance Deficit: Yes Endurance Deficit Description: requires seated rest break after short duration mobility activities PT Assessment Rehab Potential (ACUTE/IP ONLY): Excellent PT Patient demonstrates impairments in the following area(s): Balance;Endurance;Motor;Pain;Sensory PT Transfers Functional Problem(s): Bed Mobility;Bed to Chair;Car;Furniture PT Locomotion Functional Problem(s): Ambulation;Wheelchair Mobility;Stairs PT Plan PT Intensity: Minimum of 1-2 x/day ,45 to 90 minutes PT Duration Estimated Length of Stay: 7-9 PT Treatment/Interventions: Ambulation/gait training;Balance/vestibular training;Discharge planning;Community reintegration;DME/adaptive equipment instruction;Functional mobility training;Patient/family education;Pain management;Neuromuscular re-education;Psychosocial support;Splinting/orthotics;Therapeutic Exercise;Therapeutic Activities;Stair training;UE/LE Strength taining/ROM;UE/LE Coordination activities;Wheelchair propulsion/positioning PT Transfers Anticipated Outcome(s): mod I PT Locomotion Anticipated Outcome(s): mod I gait x 50' home setting wiht LRAD; supervision gait controlled and community settings ; up/down 12 steps 2 rails with supervision PT Recommendation Recommendations for Other Services: Therapeutic Recreation consult Therapeutic Recreation Interventions: Kitchen group Follow Up Recommendations: Outpatient  PT Patient destination: Home Equipment Recommended: Wheelchair (measurements) Equipment Details: w/c TBD  Skilled Therapeutic Intervention:   PT restated cervical precautions as pt greeted PT by turning his head while in bed. PT donned Aspen brace with pt in bed, at his request.   He is elated at improvements in strength and  sensation after cervical decompression.  Pt asymptomatic despite hypotension.  Pt stated he is not eating or drinking very much due to sore throat due to intubation.  Pt used throat spray, then drank a sip of water and grimaced as he swallowed with difficulty.  Chelsea, RN informed.  ELOS and LTGs discussed with pt. Pt required multiple seated rest breaks throughout session, and was exhausted by end of session.  Pt left resting in bed with alarm set and all needs within reach.    PT Evaluation Precautions/Restrictions Precautions Precautions: Fall;Cervical Precaution Comments: Cervical precautions; Aspen collar on when ambulating, up/OOB and sitting up in chair. May remove to eat, bathe or walk to BR. Required Braces or Orthoses: Cervical Brace Cervical Brace: Hard collar;At all times;Other (comment) General   Vital SignsTherapy Vitals Pulse Rate: 96 BP: (!) 99/56 Patient Position (if appropriate): Sitting Oxygen Therapy SpO2: 100 % O2 Device: Not Delivered Pain Pain Assessment Pain Assessment: 0-10 Pain Score: 2  Pain Type: Surgical pain Pain Location: Neck Pain Orientation: Posterior Pain Intervention(s): Medication (See eMAR) Home Living/Prior Functioning Home Living Available Help at Discharge: Family;Available 24 hours/day Type of Home: House Home Access: Stairs to enter [employer] of Steps: 2 Entrance Stairs-Rails: Can reach both;Left;Right Home Layout: Two level;Bed/bath upstairs;Able to live on main level with bedroom/bathroom Alternate Level Stairs-Number of Steps: 17 Alternate Level Stairs-Rails: Can reach both Bathroom Shower/Tub:  Tub/shower unit;Curtain Bathroom Toilet: Standard Bathroom Accessibility: Yes  Lives With: Spouse Prior Function Leisure: Hobbies-yes (Comment) 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 Vision/Perception - no changes; wears glasses at all times.    Cognition Overall Cognitive Status: Within Functional Limits for tasks assessed Arousal/Alertness: Awake/alert Orientation Level: Oriented X4 Memory: Appears intact Awareness: Appears intact Problem Solving: Appears intact Sensation Sensation Light Touch: Impaired Detail Light Touch Impaired Details: Impaired RLE Proprioception: Appears Intact Coordination Heel Shin Test: in supine, reduced excursion, speed bil Motor  Motor Motor: Within Functional Limits  Mobility Bed Mobility Bed Mobility: Rolling Right;Rolling Left;Left Sidelying to Sit;Sit to Supine Rolling Right: 5: Supervision Rolling Right Details: Verbal cues for technique Rolling Left: 5: Supervision Rolling Left Details: Verbal cues for technique Left Sidelying to Sit: 3: Mod assist Left Sidelying to Sit Details: Manual facilitation for weight shifting;Manual facilitation for placement;Verbal cues for technique Supine to Sit: HOB elevated Supine to Sit Details: Verbal cues for precautions/safety;Verbal cues for technique;Manual facilitation for weight bearing;Manual facilitation for weight shifting;Manual facilitation for placement Sit to Supine: 4: Min assist Sit to Supine - Details: Manual facilitation for placement;Verbal cues for technique Transfers Transfers: Yes Sit to Stand: 4: Min assist Sit to Stand Details: Manual facilitation for weight shifting Stand to Sit: 4: Min assist Stand to Sit Details (indicate cue type and reason): Manual facilitation for weight shifting Stand Pivot Transfers: 3: Mod assist Stand Pivot Transfer Details: Manual facilitation for weight shifting;Verbal cues for precautions/safety;Verbal cues for  technique Locomotion  Ambulation Ambulation: Yes Ambulation/Gait Assistance: 4: Min assist Ambulation Distance (Feet): 100 Feet Assistive device: Rolling walker Ambulation/Gait Assistance Details: Verbal cues for technique;Verbal cues for precautions/safety Gait Gait Pattern: Impaired(increased reliance on bil UEs) Gait Pattern: Narrow base of support;Decreased trunk rotation;Step-through pattern Gait velocity: decreased Stairs / Additional Locomotion Stairs: Yes Stairs Assistance: 4: Min assist Stairs Assistance Details: Verbal cues for gait pattern;Verbal cues for sequencing Stair Management Technique: Two rails Number of Stairs: 8 Height of Stairs: 3 Ramp: Not tested (comment) Curb: Not tested (comment) Wheelchair Mobility  Wheelchair Mobility: Yes Wheelchair Assistance: 5: Investment banker, operational Details: Verbal cues for Marketing executive: Both upper extremities Wheelchair Parts Management: Needs assistance Distance: 150  Trunk/Postural Assessment  Cervical Assessment Cervical Assessment: Exceptions to [facility] Cervical AROM Overall Cervical AROM Comments: cervical precautions Thoracic Assessment Thoracic Assessment: Within Functional Limits Lumbar Assessment Lumbar Assessment: Within Functional Limits Postural Control Postural Control: Deficits on evaluation Trunk Control: trunk weakness results in posterior pelvic tilt  Balance Balance Balance Assessed: Yes Static Sitting Balance Static Sitting - Balance Support: Feet supported Static Sitting - Level of Assistance: 5: Stand by assistance Dynamic Sitting Balance Dynamic Sitting - Level of Assistance: 5: Stand by assistance Static Standing Balance Static Standing - Balance Support: No upper extremity supported Static Standing - Level of Assistance: 4: Min assist Dynamic Standing Balance Dynamic Standing - Balance Support: During functional activity Dynamic Standing - Level of Assistance: 4:  Min assist Dynamic Standing - Balance Activities: Forward lean/weight shifting Extremity Assessment      RLE Assessment RLE Assessment: Exceptions to [facility] RLE Strength RLE Overall Strength: Deficits RLE Overall Strength Comments: hip flex 2+/5, knee ext 4-/5  ankle DF 4-/5 LLE Assessment LLE Assessment: Exceptions to [facility] LLE Strength LLE Overall Strength: Deficits LLE Overall Strength Comments: hip flex 2+/5; knee ext 4+/5, ankle DF 4/5, tested grossly in sitting   See Function Navigator for Current Functional Status.   Refer to Care Plan for Long Term Goals  Recommendations for other services: Therapeutic Recreation  Kitchen group  Discharge Criteria: Patient will be discharged from PT if patient refuses treatment 3 consecutive times without medical reason, if treatment goals not met, if there is a change in medical status, if patient makes no progress towards goals or if patient is discharged from hospital.  The above assessment, treatment plan, treatment alternatives and goals were discussed and mutually agreed upon: by patient  Elio Haden 02/04/2017, 12:58 PM

## 2017-02-04 NOTE — Plan of Care (Signed)
Total assist for straight cath

## 2017-02-04 NOTE — Progress Notes (Signed)
Patient information reviewed.  This is an interrupted stay of 2 days, previously admitted on CIR 01/11/2017 and transferred to acute services 02/01/17 for a cervical decompression.  Patient returned 02/03/17 to continue with inpatient rehabilitation.    Please reference record [phone] for admission information and daily progress when reviewing this record.  Information will be updated in eRehab through discharge.

## 2017-02-04 NOTE — Progress Notes (Signed)
Physical Therapy Note  Patient Details  Name: Kenneth Pace MRN: [id number] Date of Birth: 1950-06-10 Today's Date: 02/04/2017  1145-1210, 55 min indiviaul tx  Pain: 2/10 pt reported that his neck pain decreases immediately upon donning Aspen brace, even in bed  PT donned Aspen brace with pt in bed.  Chelsea, RN reported that pt's BS is 52, and brought in several regular sugar snacks and juices for pt.  No c/o dizziness or nausea.   PT suggested a warm drink might be less painful to throat; Chelsea ok'd.  Bed mobility to sit EOB x 15 minutes while drinking liquids to raise BS.  Pt drank 12 oz warm coffee with sugar with less difficulty than cold drinks, and room temp water and juices.  Leann, NT tested BS again - 65.  Pt participated in tx at w/c level.  Neuromuscular re-education via visual feedback and multimodal cues for 10 x 1 R/L hip flex, 10 x 2 alternating ankle DF/PF, and bil hip abduction against resistance band.  W/c propulsion to return to room x 100' with supervision.  PT set pt up for lunch, seated in w/c with all needs at hand.    See function navigator for current status.  Rodrigo Mcgranahan 02/04/2017, 5:16 PM

## 2017-02-04 NOTE — Progress Notes (Signed)
[facility] PHYSICAL MEDICINE & REHABILITATION     PROGRESS NOTE  Subjective/Complaints:  Patient seen lying in bed this morning. He slept well overnight, but is having neck pain this morning. He requests an increase in medications.  ROS: Denies CP, SOB, nausea, vomiting, diarrhea.  Objective: Vital Signs: Blood pressure 125/66, pulse 81, temperature 97.9 F (36.6 C), temperature source Oral, resp. rate 18, height 6' (1.829 m), weight 98.6 kg (217 lb 6 oz), SpO2 95 %. No results found. Recent Labs    02/04/17 0639  WBC 7.6  HGB 13.6  HCT 40.6  PLT 173   Recent Labs    02/04/17 0639  NA 134*  K 3.6  CL 100*  GLUCOSE 105*  BUN 17  CREATININE 0.81  CALCIUM 8.8*   CBG (last 3)  Recent Labs    02/03/17 1625 02/03/17 2045 02/04/17 0606  GLUCAP 138* 67 105*    Wt Readings from Last 3 Encounters:  02/03/17 98.6 kg (217 lb 6 oz)  02/01/17 103 kg (227 lb 1.2 oz)  02/01/17 98.6 kg (217 lb 6 oz)    Physical Exam:  BP 125/66 (BP Location: Left Arm)   Pulse 81   Temp 97.9 F (36.6 C) (Oral)   Resp 18   Ht 6' (1.829 m)   Wt 98.6 kg (217 lb 6 oz)   SpO2 95%   BMI 29.48 kg/m  Constitutional: He appears well-developed and well-nourished. No distress.  HENT: Normocephalic and atraumatic.  Eyes: EOM are normal. No discharge.  Neck: Right neck with honey comb dressing and minimal edema  Cardiovascular: Normal rate and regular rhythm. No JVD.  Respiratory: Effort normal and breath sounds normal.  GI: He exhibits distension. Bowel sounds are decreased. There is no tenderness.  Musculoskeletal: He exhibits no edema or tenderness in extremities.  Neurological: He is alert and oriented.  Motor: B/L UE 4+/5 proximal to distal B/l LE: 4+/5 proximal to distal Sensation diminished to light touch in hands > feet (improving)  Skin: Skin is warm and dry. No rash noted. He is not diaphoretic. No erythema.  Psychiatric: He has a normal mood and affect. His behavior is normal.  Judgment and thought content normal.    Assessment/Plan: 1. 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 Physiatrist is providing close team supervision and 24 hour management of active medical problems listed below. Physiatrist and rehab team continue to assess barriers to discharge/monitor patient progress toward functional and medical goals.  Function:  Bathing Bathing position      Bathing parts      Bathing assist        Upper Body Dressing/Undressing Upper body dressing   What is the patient wearing?: [place] over shirt/dress - Perfomed by helper: Put head through opening, Thread/unthread left sleeve, Thread/unthread right sleeve        Upper body assist Assist Level: 2 helpers      Lower Body Dressing/Undressing Lower body dressing                                  Lower body assist        Toileting Toileting Toileting activity did not occur: No continent bowel/bladder event        Toileting assist  Transfers Chair/bed Physiological scientist Comprehension Comprehension assist level: Follows complex conversation/direction with extra time/assistive device  Expression Expression assist level: Expresses complex ideas: With extra time/assistive device  Social Interaction Social Interaction assist level: Interacts appropriately with others - No medications needed.  Problem Solving Problem solving assist level: Solves basic problems with no assist  Memory Memory assist level: More than reasonable amount of time    Medical Problem List and Plan: 1. Weakness, limitations with ADLs secondary to demyelinating myeloneuropathy and cervical stenosis with cord compression--s/p ACDF   C3-C5   Resume CIR 2. DVT Prophylaxis/Anticoagulation: Pharmaceutical: Lovenox 3. Pain Management:    Robaxin increased and scheduled 4/day    Gabapentin  600-- 3 times a day    PRN Oxy 4. Mood: Remains optimistic. LCSW to follow for evaluation and support.  5. Neuropsych: This patient is capable of making decisions on his own behalf. 6. Skin/Wound Care: Routine pressure relief measures. Maintain adequate nutritional and hydration status.  7. Fluids/Electrolytes/Nutrition: Monitor I/Os. Added nutritional supplement to help promote healing and for BS stabilization.  8.T2DM: Hgb A1c- 7.3 (managed by Dr. Loanne Drilling) Monitor BS ac/hs. Has insulin pump--continue lantus with meal coverage and bolus according to intake.     Monitor with increased mobility 9. BPH/Neurogenic bladder:    Monitor voiding with PVR checks.    Continue finasteride and flomax.  10 HTN: Monitor BP bid   Monitor with increased mobility 11. Anxiety disorder: Managed on Buspar. 12. Hyponatremia:    Sodium 134 and 12/20, stable   Cont to monitor 13. Neutropenia: Resolved   WNL on 12/20 14. Dehydration: encourage fluid intake.    LOS (Days) 1 A FACE TO FACE EVALUATION WAS PERFORMED  Ankit Lorie Phenix 02/04/2017 8:41 AM

## 2017-02-05 ENCOUNTER — Inpatient Hospital Stay (HOSPITAL_COMMUNITY): Payer: BLUE CROSS/BLUE SHIELD | Admitting: Occupational Therapy

## 2017-02-05 ENCOUNTER — Inpatient Hospital Stay (HOSPITAL_COMMUNITY): Payer: BLUE CROSS/BLUE SHIELD | Admitting: Physical Therapy

## 2017-02-05 DIAGNOSIS — E669 Obesity, unspecified: Secondary | ICD-10-CM

## 2017-02-05 DIAGNOSIS — E1169 Type 2 diabetes mellitus with other specified complication: Secondary | ICD-10-CM

## 2017-02-05 DIAGNOSIS — G378 Other specified demyelinating diseases of central nervous system: Secondary | ICD-10-CM

## 2017-02-05 LAB — GLUCOSE, CAPILLARY
GLUCOSE-CAPILLARY: 181 mg/dL — AB (ref 65–99)
GLUCOSE-CAPILLARY: 203 mg/dL — AB (ref 65–99)
GLUCOSE-CAPILLARY: 126 mg/dL — AB (ref 65–99)
GLUCOSE-CAPILLARY: 280 mg/dL — AB (ref 65–99)

## 2017-02-05 MED ORDER — OXYCODONE HCL 5 MG PO TABS
5.0000 mg | ORAL_TABLET | Freq: Four times a day (QID) | ORAL | Status: DC | PRN
  Administered 2017-02-05 – 2017-02-11 (×10): 5 mg via ORAL
  Filled 2017-02-05 (×10): qty 1

## 2017-02-05 MED ORDER — NALOXONE HCL 0.4 MG/ML IJ SOLN
0.4000 mg | Freq: Once | INTRAMUSCULAR | Status: AC
  Administered 2017-02-05: 0.4 mg via INTRAVENOUS
  Filled 2017-02-05: qty 1

## 2017-02-05 MED ORDER — SORBITOL 70 % SOLN: 30.0000 mL | Freq: Every day | Status: DC | PRN

## 2017-02-05 MED ORDER — SODIUM CHLORIDE 0.9 % IV SOLN
INTRAVENOUS | Status: DC
  Administered 2017-02-05: 19:00:00 via INTRAVENOUS

## 2017-02-05 MED ORDER — MAGNESIUM HYDROXIDE 400 MG/5ML PO SUSP
30.0000 mL | Freq: Every day | ORAL | Status: DC | PRN
  Filled 2017-02-05 (×3): qty 30

## 2017-02-05 NOTE — Progress Notes (Signed)
Occupational Therapy Session Note  Patient Details  Name: Kenneth Pace MRN: [id number] Date of Birth: 02-Dec-1950  Today's Date: 02/05/2017 OT Individual Time: 1100-1153 OT Individual Time Calculation (min): 53 min    Short Term Goals: Week 1:  OT Short Term Goal 1 (Week 1): STG=LTG due to LOS  Skilled Therapeutic Interventions/Progress Updates:    Pt seen for OT session focusing on core strengthening/ condition and UE coordination. Pt sitting up in w/c upon arrival, voiced feeling better since PT session. He declined bathing/dressing session.  He self propelled w/c to therapy gym for UE strengthening and activity tolerance able to maintain functional grasp on w/c rim for effective propulsion.  He ambulated short distant to EOM with guarding assist. Completed core strengthening/ stability exercise, completing modified crunches seated EOM leaning back onto foam mat and then coming into upright position. Completed with ball toss aspect which pt was able to manipulate without difficulty. Upgraded to completing ball toss with small tennis ball which pt managed well with also. Pt then becoming fatigued with complaints of nausea. Rest and cold wash cloth provided. He then requested return to room and return to supine. Taken back to room and transferred to supine with assist for management of B LEs into bed. Left in supine with all needs in reach, RN made aware of pt's complaints.   Therapy Documentation Precautions:  Precautions Precautions: Fall, Cervical Precaution Comments: Cervical precautions; Aspen collar on when ambulating, up/OOB and sitting up in chair. May remove to eat, bathe or walk to BR. Required Braces or Orthoses: Cervical Brace Cervical Brace: Hard collar Restrictions Weight Bearing Restrictions: No Pain:   Complaints of nausea, rest, repositioned, and RN aware  See Function Navigator for Current Functional Status.   Therapy/Group: Individual Therapy  Abigale Dorow  L 02/05/2017, 6:35 AM

## 2017-02-05 NOTE — Progress Notes (Signed)
Social Work  Please note attached, original psychosocial assessment report for this patient.  No changes to information needed.  Patient extremely pleased with being able to return to CIR. He has very good understanding of surgery performed and of resulting, current limitations/need for CIR return.  Discharge plan remains for patient to return home with his wife who can provide supervision to minimal assistance.         Patient ID: Kenneth Pace, male   DOB: 01/01/1951, 66 y.o.   MRN: [id number]    Rexene Alberts  Social Worker  General Practice  Progress Notes  Signed  Date of Service:  01/14/2017  4:52 PM       Related encounter: Admission (Discharged) from 01/11/2017 in [facility]          [] Hide copied text  [] Hover for details   Social Work  Social Work Assessment and Plan   Patient Details  Name: Kenneth Pace MRN: [id number] Date of Birth: 1950-09-04   Today's Date: 01/14/2017   Problem List:      Patient Active Problem List    Diagnosis Date Noted  . Benign essential HTN    . Labile blood glucose    . Muscle spasm    . Type 2 diabetes mellitus with peripheral neuropathy (HCC)    . Generalized anxiety disorder    . Acute inflammatory demyelinating polyneuropathy ([place]) 01/11/2017  . Anxiety state    . Neurogenic bladder    . Depression 12/30/2016  . Leg weakness, bilateral 12/30/2016  . 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 GBS (Guillain Barre syndrome) ([place])    . AIDP (acute inflammatory demyelinating polyneuropathy) ([place]) 11/27/2016  . Weakness 11/20/2016  . Weakness of both arms 10/30/2016  . Numbness 03/18/2016  . Diabetes mellitus without complication ([place]) 43/73/5789  . Eustachian tube dysfunction 02/12/2015  . Acute maxillary sinusitis 02/12/2015  . Wellness examination 08/22/2014  . Alkaline phosphatase elevation 08/22/2014  .  Screening for prostate cancer 04/24/2013  . Routine general medical examination at a health care facility 04/10/2012  . BPH (benign prostatic hyperplasia) 02/03/2010  . HEMOCCULT POSITIVE STOOL 05/25/2008  . ELBOW PAIN, RIGHT 07/04/2007  . Dyslipidemia 02/16/2007  . ANXIETY 02/16/2007  . ERECTILE DYSFUNCTION 02/16/2007  . Essential hypertension 02/16/2007  . TESTICULAR MASS, LEFT 02/16/2007  . KNEE PAIN, CHRONIC 02/16/2007  . ADENOIDECTOMY, HX OF 02/16/2007    Past Medical History:      Past Medical History:  Diagnosis Date  . ADENOIDECTOMY, HX OF 02/16/2007  . Anxiety      pt. states he does not have anxiety.  Marland Kitchen BENIGN PROSTATIC HYPERTROPHY, WITH OBSTRUCTION 02/03/2010  . DIABETES MELLITUS, TYPE I, UNCONTROLLED 12/29/2006  . ERECTILE DYSFUNCTION 02/16/2007  . HYPERLIPIDEMIA 02/16/2007  . HYPERTENSION 02/16/2007  . TESTICULAR MASS, LEFT 02/16/2007    Past Surgical History:       Past Surgical History:  Procedure Laterality Date  . KNEE SURGERY        2 Lknee arthroscopy, 3 R knee arthroscpoy  . KNEE SURGERY Right 11/12/2016  . TONSILLECTOMY        Social History:  reports that  has never smoked. he has never used smokeless tobacco. He reports that he does not drink alcohol or use drugs.   Family / Support Systems Marital Status: Married How Long?: 77 yrs Patient Roles: Spouse, Parent Spouse/Significant Other: wife, Emidio Warrell @ (H)  [phone] or (C) [phone] Children: they have two sons living locally Anticipated Caregiver: Spouse, [name]  Ability/Limitations of Caregiver: Spouse works from home  Caregiver Availability: 24/7 Family Dynamics: Pt describes his wife as very involved and supportive.  Notes, "she's in charge of it all..."  Notes their sons are equally supportive but are busy with work and family demands.   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    Abuse/Neglect Abuse/Neglect Assessment Can Be Completed: Yes Physical Abuse: Denies Verbal Abuse: Denies Sexual Abuse: Denies Exploitation of patient/patient's resources: Denies Self-Neglect: Denies   Emotional Status Pt's affect, behavior adn adjustment status: Pt very pleasant and talks openly with me about his course of illness and lengthy medical search for cause/ diagnosis.  He is hopeful that he is now on "the road to recovery" and will be returning to work soon.  He denies any signficant emotional distress, however, will monitor and refer for neuropsychology as indicated. Recent Psychosocial Issues: None Pyschiatric History: None Substance Abuse History: None   Patient / Family Perceptions, Expectations & Goals Pt/Family understanding of illness & functional limitations: Pt and wife with very good understanding of his confirmed diagnosis of CIDP.  Wife is a Therapist, sports and pt able to provide detailed report of all of the testing that took place to confirm.  Good understanding of his current functional limitations/ need for CIR. Premorbid pt/family roles/activities: Pt was completely independent prior to 11/2016 and working f/t Anticipated changes in roles/activities/participation: Wife aware that she will need to continue to provide caregiver support.   Pt/family expectations/goals: "I just don't want to be too much on Kristy."   [employer]: None Premorbid Home Care/DME Agencies: Other (Comment)(OP therapies at [facility] ) Transportation available at discharge: yes Resource referrals recommended: Neuropsychology   Discharge Planning Living Arrangements: Spouse/significant other Support Systems: Spouse/significant other, Children, Other  relatives, Friends/neighbors Type of Residence: Private residence Insurance Resources: Multimedia programmer (specify)(BCBS) Financial Resources: Employment Financial Screen Referred: No Living Expenses: [place] Does the patient have any problems obtaining your medications?: No Home Management: pt and wife Patient/Family Preliminary Plans: Pt will d/c home with wife who can provide supervision   Clinical Impression Very pleasant gentleman here following gradual decline in function since Oct with final diagnosis of CIDP.  Pt and wife have a very good understanding of the diagnosis and of current functional limitations.  Wife working from home Investment banker, corporate with  [employer]).  Pt denies any significant emotional distress.  Will follow for support and d/c planning needs.   Saman Giddens 01/14/2017, 4:52 PM

## 2017-02-05 NOTE — Progress Notes (Signed)
[place] PHYSICAL MEDICINE & REHABILITATION     PROGRESS NOTE  Subjective/Complaints:  Patient seen sitting up in bed this morning. He states he slept better overnight. He states he had a good day in therapy yesterday and was able to ambulate without the walker.  ROS: Denies CP, SOB, nausea, vomiting, diarrhea.  Objective: Vital Signs: Blood pressure 125/74, pulse 83, temperature 98 F (36.7 C), temperature source Oral, resp. rate 16, height 6' (1.829 m), weight 98.6 kg (217 lb 6 oz), SpO2 99 %. No results found. Recent Labs    02/04/17 0639  WBC 7.6  HGB 13.6  HCT 40.6  PLT 173   Recent Labs    02/04/17 0639  NA 134*  K 3.6  CL 100*  GLUCOSE 105*  BUN 17  CREATININE 0.81  CALCIUM 8.8*   CBG (last 3)  Recent Labs    02/04/17 1722 02/04/17 2025 02/05/17 0642  GLUCAP 204* 112* 181*    Wt Readings from Last 3 Encounters:  02/03/17 98.6 kg (217 lb 6 oz)  02/01/17 103 kg (227 lb 1.2 oz)  02/01/17 98.6 kg (217 lb 6 oz)    Physical Exam:  BP 125/74 (BP Location: Left Arm)   Pulse 83   Temp 98 F (36.7 C) (Oral)   Resp 16   Ht 6' (1.829 m)   Wt 98.6 kg (217 lb 6 oz)   SpO2 99%   BMI 29.48 kg/m  Constitutional: He appears well-developed and well-nourished. No distress.  HENT: Normocephalic and atraumatic.  Eyes: EOM are normal. No discharge.  Neck: Right neck with honey comb dressing and minimal edema  Cardiovascular: RRR. No JVD.  Respiratory: Effort normal and breath sounds normal.  GI: He exhibits no distention. BS+  Musculoskeletal: He exhibits no edema or tenderness in extremities.  Neurological: He is alert and oriented.  Motor: B/L UE 4+/5 proximal to distal (stable) B/l LE: 4+/5 proximal to distal Sensation diminished to light touch in hands > feet (improving)  Skin: Skin is warm and dry. No rash noted. He is not diaphoretic. No erythema.  Psychiatric: He has a normal mood and affect. His behavior is normal. Judgment and thought content normal.     Assessment/Plan: 1. 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 Physiatrist is providing close team supervision and 24 hour management of active medical problems listed below. Physiatrist and rehab team continue to assess barriers to discharge/monitor patient progress toward functional and medical goals.  Function:  Bathing Bathing position Bathing activity did not occur: Refused    Bathing parts      Bathing assist        Upper Body Dressing/Undressing Upper body dressing   What is the patient wearing?: Pull over shirt/dress     Pull over shirt/dress - Perfomed by patient: Thread/unthread right sleeve, Thread/unthread left sleeve, Put head through opening, Pull shirt over trunk Pull over shirt/dress - Perfomed by helper: Put head through opening, Thread/unthread left sleeve, Thread/unthread right sleeve        Upper body assist Assist Level: Set up   Set up : To obtain clothing/put away  Lower Body Dressing/Undressing Lower body dressing   What is the patient wearing?: Pants, Non-skid slipper socks     Pants- Performed by patient: Thread/unthread right pants leg, Thread/unthread left pants leg, Pull pants up/down, Fasten/unfasten pants   Non-skid slipper  socks- Performed by patient: Don/doff right sock, Don/doff left sock                    Lower body assist Assist for lower body dressing: Touching or steadying assistance (Pt > 75%)      Toileting Toileting Toileting activity did not occur: No continent bowel/bladder event Toileting steps completed by patient: Adjust clothing prior to toileting, Performs perineal hygiene, Adjust clothing after toileting   Toileting Assistive Devices: Grab bar or rail  Toileting assist Assist level: Touching or steadying assistance (Pt.75%)    Transfers Chair/bed transfer   Chair/bed transfer method: Stand pivot Chair/bed transfer assist level: Moderate assist (Pt 50 - 74%/lift or lower) Chair/bed transfer assistive device: Armrests     Locomotion Ambulation     Max distance: 100 Assist level: Touching or steadying assistance (Pt > 75%)   Wheelchair   Type: Manual Max wheelchair distance: 150 Assist Level: Supervision or verbal cues  Cognition Comprehension Comprehension assist level: Follows complex conversation/direction with extra time/assistive device  Expression Expression assist level: Expresses complex ideas: With extra time/assistive device  Social Interaction Social Interaction assist level: Interacts appropriately with others - No medications needed.  Problem Solving Problem solving assist level: Solves basic problems with no assist  Memory Memory assist level: More than reasonable amount of time    Medical Problem List and Plan: 1. 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 DVT Prophylaxis/Anticoagulation: Pharmaceutical: Lovenox 3. Pain Management:    Robaxin increased and scheduled 4/day    Gabapentin  600-- 3 times a day    PRN Oxy 4. Mood: Remains optimistic. LCSW to follow for evaluation and support.  5. Neuropsych: This patient is capable of making decisions on his own behalf. 6. Skin/Wound Care: Routine pressure relief measures. Maintain adequate nutritional and hydration status.  7. Fluids/Electrolytes/Nutrition: Monitor I/Os. Added nutritional supplement to help promote healing and for BS stabilization.  8.T2DM: Hgb A1c- 7.3 (managed by Dr. Loanne Drilling) Monitor BS ac/hs. Has insulin pump--continue lantus with meal coverage and bolus according to intake.     Relatively controlled on 12/21 9. BPH/Neurogenic bladder:    Continue finasteride and flomax.    Bethanechol started 12/20    Cont to monitor 10 HTN: Monitor BP bid   Controlled on 12/21 11. Anxiety disorder: Managed on Buspar. 12. Hyponatremia:    Sodium 134 and 12/20, stable   Cont to monitor 13. Neutropenia: Resolved   WNL on 12/20 14. Dehydration: encourage fluid intake.   LOS (Days) 2 A FACE TO FACE EVALUATION WAS PERFORMED  Joevon Holliman Lorie Phenix 02/05/2017 9:08 AM

## 2017-02-05 NOTE — Progress Notes (Signed)
Occupational Therapy Session Note  Patient Details  Name: Kenneth Pace MRN: [id number] Date of Birth: May 16, 1950  Today's Date: 02/05/2017 OT Individual Time: 8295-6213 OT Individual Time Calculation (min): 23 min    Short Term Goals: Week 1:  OT Short Term Goal 1 (Week 1): STG=LTG due to LOS  Skilled Therapeutic Interventions/Progress Updates:    Treatment session focused on pt education. Upon entering room, pt reported to be getting and IV put in. Therapist approached nurse and MD to discuss patient current medical state. Doctor reported that pt had an adverse reaction to his medications from this morning. The team responded by providing appropriate medical treatment in this case which he was now alert. The IV team was called to place a port but no clear plans. This therapist discussed with patient about pts current state and determined that therapy is not appropriate at this time as pts physical and mental state were impaired. This therapist communicated this to nursing. Pt left resting comfortably in room.   Therapy Documentation Precautions:  Precautions Precautions: Fall, Cervical Precaution Comments: Cervical precautions; Aspen collar on when ambulating, up/OOB and sitting up in chair. May remove to eat, bathe or walk to BR. Required Braces or Orthoses: Cervical Brace Cervical Brace: Hard collar Restrictions Weight Bearing Restrictions: No General: General OT Amount of Missed Time: 52 Minutes Vital Signs: Therapy Vitals Temp: 97.8 F (36.6 C) Temp Source: Oral Pulse Rate: 88 Resp: 20 BP: (!) 145/74 Patient Position (if appropriate): Lying Oxygen Therapy SpO2: 94 % O2 Device: Not Delivered Pain: Pain Assessment Pain Assessment: No/denies pain  See Function Navigator for Current Functional Status.   Therapy/Group: Individual Therapy  Delon [place] 02/05/2017, 2:46 PM

## 2017-02-05 NOTE — Progress Notes (Signed)
Physical Therapy Note  Patient Details  Name: Kenneth Pace MRN: [id number] Date of Birth: [birth year] Today's Date: 02/05/2017    Time: [phone] 30 minutes  1:1 Pt c/o nausea throughout session, RN aware and meds given during session.  Pt performs bed mobility with use of bed rails and supervision.  Gait 50' x 2 with RW with close supervision.  Pt unable to continue session due to nausea.  Pt left in bed with needs at hand.  Missed 30 minutes of session due to nausea.   Demitrios Molyneux 02/05/2017, 10:02 AM

## 2017-02-05 NOTE — Progress Notes (Signed)
Patient with increased somnolence felt to be medication induced. Patient is arousable he does answer basic questions response to deep stimuli. At this time will discontinue Robaxin as well as Urecholine. He received his last oxycodone 0900  this morning. We'll give Narcan 1. Begin gentle IV fluids. Latest chemistries unremarkable. Patient is afebrile. Incision site clean and dry

## 2017-02-06 ENCOUNTER — Inpatient Hospital Stay (HOSPITAL_COMMUNITY): Payer: BLUE CROSS/BLUE SHIELD | Admitting: Occupational Therapy

## 2017-02-06 DIAGNOSIS — T50901A Poisoning by unspecified drugs, medicaments and biological substances, accidental (unintentional), initial encounter: Secondary | ICD-10-CM

## 2017-02-06 DIAGNOSIS — T50901D Poisoning by unspecified drugs, medicaments and biological substances, accidental (unintentional), subsequent encounter: Secondary | ICD-10-CM

## 2017-02-06 LAB — GLUCOSE, CAPILLARY
GLUCOSE-CAPILLARY: 170 mg/dL — AB (ref 65–99)
Glucose-Capillary: 156 mg/dL — ABNORMAL HIGH (ref 65–99)
Glucose-Capillary: 211 mg/dL — ABNORMAL HIGH (ref 65–99)
Glucose-Capillary: 156 mg/dL — ABNORMAL HIGH (ref 65–99)

## 2017-02-06 MED ORDER — LIDOCAINE 5 % EX PTCH
1.0000 | MEDICATED_PATCH | CUTANEOUS | Status: DC
  Administered 2017-02-06 – 2017-02-10 (×5): 1 via TRANSDERMAL
  Filled 2017-02-06 (×4): qty 1

## 2017-02-06 NOTE — Progress Notes (Signed)
Occupational Therapy Session Note  Patient Details  Name: Kenneth Pace MRN: [id number] Date of Birth: [birth year]  Today's Date: 02/06/2017 OT Individual Time: 0903-1000 OT Individual Time Calculation (min): 57 min    Short Term Goals: Week 1:  OT Short Term Goal 1 (Week 1): STG=LTG due to LOS  Skilled Therapeutic Interventions/Progress Updates:    Pt resting in bed upon OT arrival and was agreeable to participating in therapy.  Pt reported increased pain/discomfort at start of session, possibly d/t increased time in bed yesterday d/t medical status.  Medication provided prior to OT arrival, pain decreased throughout session.  Pt participated in gentle BUE therex with 1# free weight within spinal precautions to stretch UB musculature and strengthen for functional safety/I with BADLs and IADLs.  Pt participated in [facility] activity with purdue pegboard, completing 5 patterns with each UE with increased time, occasionally dropping objects.  Pt completed pipe tree pattern while standing x 5 mins with SBA, no LOB.  Pt transitioned w/c>EOM with CGA for SPT and participated in core strengthening therex to improve postural control and stability during functional mobility.  Occasional Min A provided to stabilize during mini-crunches d/t decreased core strength.  Pt assisted back to room via w/c, stood and ambulated with RW and SBA/CGA to bathroom.  Pt left seated in bathroom with NA present and all needs met upon OT departure.  Therapy Documentation Precautions:  Precautions Precautions: Fall, Cervical Precaution Comments: Cervical precautions; Aspen collar on when ambulating, up/OOB and sitting up in chair. May remove to eat, bathe or walk to BR. Required Braces or Orthoses: Cervical Brace Cervical Brace: Hard collar Restrictions Weight Bearing Restrictions: No Pain: Pain Assessment Pain Assessment: 0-10 Pain Score: 7  Pain Type: Surgical pain Pain Location: Neck Pain Descriptors /  Indicators: Aching  See Function Navigator for Current Functional Status.   Therapy/Group: Individual Therapy  Marcella Dubs 02/06/2017, 12:01 PM

## 2017-02-06 NOTE — Progress Notes (Signed)
[place] PHYSICAL MEDICINE & REHABILITATION     PROGRESS NOTE  Subjective/Complaints:  Patient seen sitting up in his chair this morning. He states he slept fairly last night. Discussed with PA yesterday regarding significant increase in lethargy, resolved after Narcan administered. Discussed with patient today states he did not eat well throughout the day and was very hot in the morning and felt nauseated and took all of his medications.  ROS: Denies CP, SOB, nausea, vomiting, diarrhea.  Objective: Vital Signs: Blood pressure 117/69, pulse 84, temperature 98.2 F (36.8 C), temperature source Oral, resp. rate 18, height 6' (1.829 m), weight 98.6 kg (217 lb 6 oz), SpO2 94 %. No results found. Recent Labs    02/04/17 0639  WBC 7.6  HGB 13.6  HCT 40.6  PLT 173   Recent Labs    02/04/17 0639  NA 134*  K 3.6  CL 100*  GLUCOSE 105*  BUN 17  CREATININE 0.81  CALCIUM 8.8*   CBG (last 3)  Recent Labs    02/05/17 1644 02/05/17 2041 02/06/17 0628  GLUCAP 126* 280* 156*    Wt Readings from Last 3 Encounters:  02/03/17 98.6 kg (217 lb 6 oz)  02/01/17 103 kg (227 lb 1.2 oz)  02/01/17 98.6 kg (217 lb 6 oz)    Physical Exam:  BP 117/69 (BP Location: Left Arm)   Pulse 84   Temp 98.2 F (36.8 C) (Oral)   Resp 18   Ht 6' (1.829 m)   Wt 98.6 kg (217 lb 6 oz)   SpO2 94%   BMI 29.48 kg/m  Constitutional: He appears well-developed and well-nourished. No distress.  HENT: Normocephalic and atraumatic.  Eyes: EOM are normal. No discharge.  Neck: Right neck with honey comb dressing and minimal edema  Cardiovascular: RRR. No JVD.  Respiratory: Effort normal and breath sounds normal.  GI: He exhibits no distention. BS+  Musculoskeletal: He exhibits no edema or tenderness in extremities.  Neurological: He is alert and oriented.  Motor: B/L UE 4+/5 proximal to distal (unchanged) B/l LE: 4+/5 proximal to distal Sensation diminished to light touch in hands > feet (improving)   Skin: Skin is warm and dry. No rash noted. He is not diaphoretic. No erythema.  Psychiatric: He has a normal mood and affect. His behavior is normal. Judgment and thought content normal.    Assessment/Plan: 1. 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 Physiatrist is providing close team supervision and 24 hour management of active medical problems listed below. Physiatrist and rehab team continue to assess barriers to discharge/monitor patient progress toward functional and medical goals.  Function:  Bathing Bathing position Bathing activity did not occur: Refused    Bathing parts      Bathing assist        Upper Body Dressing/Undressing Upper body dressing   What is the patient wearing?: Pull over shirt/dress     Pull over shirt/dress - Perfomed by patient: Thread/unthread right sleeve, Thread/unthread left sleeve, Put head through opening, Pull shirt over trunk Pull over shirt/dress - Perfomed by helper: Put head through opening, Thread/unthread left sleeve, Thread/unthread right sleeve        Upper body assist Assist Level: Set up   Set up : To obtain clothing/put away  Lower Body Dressing/Undressing Lower body dressing   What is the patient wearing?: Pants, Non-skid slipper  socks     Pants- Performed by patient: Thread/unthread right pants leg, Thread/unthread left pants leg, Pull pants up/down, Fasten/unfasten pants   Non-skid slipper socks- Performed by patient: Don/doff right sock, Don/doff left sock                    Lower body assist Assist for lower body dressing: Touching or steadying assistance (Pt > 75%)      Toileting Toileting Toileting activity did not occur: No continent bowel/bladder event Toileting steps completed by patient: Adjust clothing prior to  toileting, Performs perineal hygiene, Adjust clothing after toileting   Toileting Assistive Devices: Grab bar or rail  Toileting assist Assist level: Supervision or verbal cues   Transfers Chair/bed transfer   Chair/bed transfer method: Stand pivot Chair/bed transfer assist level: Moderate assist (Pt 50 - 74%/lift or lower) Chair/bed transfer assistive device: Armrests     Locomotion Ambulation     Max distance: 100 Assist level: Touching or steadying assistance (Pt > 75%)   Wheelchair   Type: Manual Max wheelchair distance: 150 Assist Level: Supervision or verbal cues  Cognition Comprehension Comprehension assist level: Follows complex conversation/direction with extra time/assistive device  Expression Expression assist level: Expresses complex ideas: With extra time/assistive device  Social Interaction Social Interaction assist level: Interacts appropriately with others - No medications needed.  Problem Solving Problem solving assist level: Solves basic problems with no assist  Memory Memory assist level: More than reasonable amount of time    Medical Problem List and Plan: 1. 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 DVT Prophylaxis/Anticoagulation: Pharmaceutical: Lovenox 3. Pain Management:    Robaxin DC'd    Gabapentin  600-- 3 times a day    PRN Oxy, decreased frequency   Will need to be cautious in administering pain medications due to systemic stimulation on 12/21 and need for Narcan for reversal of symptoms. 4. Mood: Remains optimistic. LCSW to follow for evaluation and support.  5. Neuropsych: This patient is capable of making decisions on his own behalf. 6. Skin/Wound Care: Routine pressure relief measures. Maintain adequate nutritional and hydration status.  7. Fluids/Electrolytes/Nutrition: Monitor I/Os. Added nutritional supplement to help promote  healing and for BS stabilization.  8.T2DM: Hgb A1c- 7.3 (managed by Dr. Loanne Drilling) Monitor BS ac/hs. Has insulin pump--continue lantus with meal coverage and bolus according to intake.     Relatively controlled on 12/22 9. BPH/Neurogenic bladder:    Continue finasteride and flomax.    Bethanechol started 12/20   Cont to monitor 10 HTN: Monitor BP bid   Controlled on 12/22 11. Anxiety disorder: Managed on Buspar. 12. Hyponatremia:    Sodium 134 and 12/20, stable   Cont to monitor 13. Neutropenia: Resolved   WNL on 12/20 14. Dehydration: encourage fluid intake.   LOS (Days) 3 A FACE TO FACE EVALUATION WAS PERFORMED  Bohden Dung Lorie Phenix 02/06/2017 7:45 AM

## 2017-02-07 DIAGNOSIS — R339 Retention of urine, unspecified: Secondary | ICD-10-CM

## 2017-02-07 LAB — GLUCOSE, CAPILLARY
GLUCOSE-CAPILLARY: 149 mg/dL — AB (ref 65–99)
GLUCOSE-CAPILLARY: 161 mg/dL — AB (ref 65–99)
Glucose-Capillary: 71 mg/dL (ref 65–99)
Glucose-Capillary: 227 mg/dL — ABNORMAL HIGH (ref 65–99)
Glucose-Capillary: 184 mg/dL — ABNORMAL HIGH (ref 65–99)

## 2017-02-07 MED ORDER — BETHANECHOL CHLORIDE 25 MG PO TABS
25.0000 mg | ORAL_TABLET | Freq: Four times a day (QID) | ORAL | Status: DC
  Administered 2017-02-07 – 2017-02-12 (×21): 25 mg via ORAL
  Filled 2017-02-07 (×21): qty 1

## 2017-02-07 NOTE — Progress Notes (Signed)
[place] [facility] MEDICINE & REHABILITATION     PROGRESS NOTE  Subjective/Complaints:  Patient seen sitting up in bed this morning. He states he slept well overnight. He notes his pain is better controlled with the Lidoderm patch. He also notes mild improvement in bladder function.  ROS: Denies CP, SOB, nausea, vomiting, diarrhea.  Objective: Vital Signs: Blood pressure 125/62, pulse 79, temperature 97.6 F (36.4 C), temperature source Oral, resp. rate 18, height 6' (1.829 m), weight 98.6 kg (217 lb 6 oz), SpO2 96 %. No results found. No results for input(s): WBC, HGB, HCT, PLT in the last 72 hours. No results for input(s): NA, K, CL, GLUCOSE, BUN, CREATININE, CALCIUM in the last 72 hours.  Invalid input(s): CO CBG (last 3)  Recent Labs    02/06/17 2104 02/07/17 0123 02/07/17 0655  GLUCAP 156* 71 227*    Wt Readings from Last 3 Encounters:  02/03/17 98.6 kg (217 lb 6 oz)  02/01/17 103 kg (227 lb 1.2 oz)  02/01/17 98.6 kg (217 lb 6 oz)    Physical Exam:  BP 125/62 (BP Location: Right Arm)   Pulse 79   Temp 97.6 F (36.4 C) (Oral)   Resp 18   Ht 6' (1.829 m)   Wt 98.6 kg (217 lb 6 oz)   SpO2 96%   BMI 29.48 kg/m  Constitutional: He appears well-developed and well-nourished. No distress.  HENT: Normocephalic and atraumatic.  Eyes: EOM are normal. No discharge.  Neck: Right neck with honey comb dressing and edema  Cardiovascular: RRR. No JVD.  Respiratory: Effort normal and breath sounds normal.  GI: He exhibits no distention. BS+  Musculoskeletal: He exhibits no edema or tenderness in extremities.  Neurological: He is alert and oriented.  Motor: B/L UE 4+/5 proximal to distal (unchanged) B/l LE: HF 4-/5, KE, ADF/PF 4+/5  Sensation diminished to light touch in hands > feet (improving)  Skin: Skin is warm and dry. No rash noted. He is not diaphoretic. No erythema.  Psychiatric: He has a normal mood and affect. His behavior is normal. Judgment and thought content  normal.    Assessment/Plan: 1. 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 Physiatrist is providing close team supervision and 24 hour management of active medical problems listed below. Physiatrist and rehab team continue to assess barriers to discharge/monitor patient progress toward functional and medical goals.  Function:  Bathing Bathing position Bathing activity did not occur: Refused    Bathing parts      Bathing assist        Upper Body Dressing/Undressing Upper body dressing   What is the patient wearing?: Pull over shirt/dress     Pull over shirt/dress - Perfomed by patient: Thread/unthread right sleeve, Thread/unthread left sleeve, Put head through opening, Pull shirt over trunk Pull over shirt/dress - Perfomed by helper: Put head through opening, Thread/unthread left sleeve, Thread/unthread right sleeve        Upper body assist Assist Level: Set up   Set up : To obtain clothing/put away  Lower Body Dressing/Undressing Lower body dressing   What is the patient wearing?: Pants, Non-skid slipper socks     Pants- Performed by patient: Thread/unthread right pants leg, Thread/unthread left pants leg, Pull pants up/down, Fasten/unfasten pants   Non-skid slipper socks- Performed by patient: Don/doff right sock, Don/doff left sock  Lower body assist Assist for lower body dressing: Touching or steadying assistance (Pt > 75%)      Toileting Toileting Toileting activity did not occur: No continent bowel/bladder event Toileting steps completed by patient: Adjust clothing prior to toileting, Performs perineal hygiene, Adjust clothing after toileting   Toileting Assistive Devices: Grab bar or rail  Toileting assist Assist level: Supervision or verbal cues    Transfers Chair/bed transfer   Chair/bed transfer method: Stand pivot Chair/bed transfer assist level: Moderate assist (Pt 50 - 74%/lift or lower) Chair/bed transfer assistive device: Armrests     Locomotion Ambulation     Max distance: 100 Assist level: Touching or steadying assistance (Pt > 75%)   Wheelchair   Type: Manual Max wheelchair distance: 150 Assist Level: Supervision or verbal cues  Cognition Comprehension Comprehension assist level: Follows complex conversation/direction with extra time/assistive device  Expression Expression assist level: Expresses complex ideas: With extra time/assistive device  Social Interaction Social Interaction assist level: Interacts appropriately with others - No medications needed.  Problem Solving Problem solving assist level: Solves basic problems with no assist  Memory Memory assist level: More than reasonable amount of time    Medical Problem List and Plan: 1. 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 DVT Prophylaxis/Anticoagulation: Pharmaceutical: Lovenox 3. Pain Management:    Robaxin DC'd    Gabapentin  600-- 3 times a day    PRN Oxy, decreased frequency   Lidoderm patch added on 12/22 with benefit   Will need to be cautious in administering pain medications due to systemic stimulation on 12/21 and need for Narcan for reversal of symptoms. 4. Mood: Remains optimistic. LCSW to follow for evaluation and support.  5. Neuropsych: This patient is capable of making decisions on his own behalf. 6. Skin/Wound Care: Routine pressure relief measures. Maintain adequate nutritional and hydration status.  7. Fluids/Electrolytes/Nutrition: Monitor I/Os. Added nutritional supplement to help promote healing and for BS stabilization.  8.T2DM: Hgb A1c- 7.3 (managed by Dr. Loanne Drilling) Monitor BS ac/hs. Has insulin pump--continue lantus  with meal coverage and bolus according to intake.     Labile on 12/23 9. BPH/Neurogenic bladder:    Continue finasteride and flomax.    Bethanechol started 12/20, increased on 12/23   Cont to monitor 10 HTN: Monitor BP bid   Controlled on 12/23 11. Anxiety disorder: Managed on Buspar. 12. Hyponatremia:    Sodium 134 and 12/20, stable   Cont to monitor 13. Neutropenia: Resolved   WNL on 12/20 14. Dehydration: encourage fluid intake.   LOS (Days) 4 A FACE TO FACE EVALUATION WAS PERFORMED  Kenneth Pace 02/07/2017 7:49 AM

## 2017-02-08 ENCOUNTER — Other Ambulatory Visit: Payer: Self-pay

## 2017-02-08 ENCOUNTER — Inpatient Hospital Stay (HOSPITAL_COMMUNITY): Payer: BLUE CROSS/BLUE SHIELD | Admitting: Occupational Therapy

## 2017-02-08 ENCOUNTER — Inpatient Hospital Stay (HOSPITAL_COMMUNITY): Payer: BLUE CROSS/BLUE SHIELD

## 2017-02-08 LAB — GLUCOSE, CAPILLARY
GLUCOSE-CAPILLARY: 192 mg/dL — AB (ref 65–99)
GLUCOSE-CAPILLARY: 187 mg/dL — AB (ref 65–99)
GLUCOSE-CAPILLARY: 189 mg/dL — AB (ref 65–99)
Glucose-Capillary: 164 mg/dL — ABNORMAL HIGH (ref 65–99)

## 2017-02-08 MED ORDER — METHYLPREDNISOLONE 4 MG PO TBPK
4.0000 mg | ORAL_TABLET | Freq: Four times a day (QID) | ORAL | Status: DC
  Administered 2017-02-10 – 2017-02-12 (×8): 4 mg via ORAL

## 2017-02-08 MED ORDER — METHYLPREDNISOLONE 4 MG PO TBPK
4.0000 mg | ORAL_TABLET | ORAL | Status: AC
  Administered 2017-02-08: 4 mg via ORAL

## 2017-02-08 MED ORDER — METHYLPREDNISOLONE 4 MG PO TBPK
4.0000 mg | ORAL_TABLET | Freq: Three times a day (TID) | ORAL | Status: AC
  Administered 2017-02-09 (×3): 4 mg via ORAL

## 2017-02-08 MED ORDER — METHYLPREDNISOLONE 4 MG PO TBPK
8.0000 mg | ORAL_TABLET | Freq: Every evening | ORAL | Status: AC
  Administered 2017-02-08: 8 mg via ORAL

## 2017-02-08 MED ORDER — METHYLPREDNISOLONE 4 MG PO TBPK
8.0000 mg | ORAL_TABLET | Freq: Every evening | ORAL | Status: AC
  Administered 2017-02-09: 8 mg via ORAL

## 2017-02-08 MED ORDER — METHYLPREDNISOLONE 4 MG PO TBPK
8.0000 mg | ORAL_TABLET | Freq: Every morning | ORAL | Status: AC
  Filled 2017-02-08: qty 21

## 2017-02-08 NOTE — Progress Notes (Signed)
Orthopedic Tech Progress Note Patient Details:  GEORDIE NOONEY 1950-03-13 [id number]  Patient ID: Noel Journey, male   DOB: 1951-01-11, 66 y.o.   MRN: [id number]   [place] Pink 02/08/2017, 10:53 AMCalled Bio-[employer] for Aspen collar.

## 2017-02-08 NOTE — Progress Notes (Signed)
[place] MEDICINE & REHABILITATION     PROGRESS NOTE  Subjective/Complaints:  Pt seen working with therapies this AM.  He states he slept well overnight.  He complains of nauseas when he has his neck brace on and neck edema.    ROS: Denies CP, SOB, vomiting, diarrhea.  Objective: Vital Signs: Blood pressure 130/79, pulse 90, temperature 97.7 F (36.5 C), temperature source Oral, resp. rate 18, height 6' (1.829 m), weight 98.6 kg (217 lb 6 oz), SpO2 97 %. No results found. No results for input(s): WBC, HGB, HCT, PLT in the last 72 hours. No results for input(s): NA, K, CL, GLUCOSE, BUN, CREATININE, CALCIUM in the last 72 hours.  Invalid input(s): CO CBG (last 3)  Recent Labs    02/07/17 1628 02/07/17 2111 02/08/17 0652  GLUCAP 149* 161* 192*    Wt Readings from Last 3 Encounters:  02/03/17 98.6 kg (217 lb 6 oz)  02/01/17 103 kg (227 lb 1.2 oz)  02/01/17 98.6 kg (217 lb 6 oz)    Physical Exam:  BP 130/79 (BP Location: Left Arm)   Pulse 90   Temp 97.7 F (36.5 C) (Oral)   Resp 18   Ht 6' (1.829 m)   Wt 98.6 kg (217 lb 6 oz)   SpO2 97%   BMI 29.48 kg/m  Constitutional: He appears well-developed and well-nourished. No distress.  HENT: Normocephalic and atraumatic.  Eyes: EOM are normal. No discharge.  Neck: Right neck with honey comb dressing and edema, increasing Cardiovascular: RRR. No JVD.  Respiratory: Effort normal and breath sounds normal.  GI: He exhibits no distention. BS+  Musculoskeletal: He exhibits no edema or tenderness in extremities.  Neurological: He is alert and oriented.  Motor: B/L UE 4+/5 proximal to distal (unchanged) B/l LE: HF 4-/5, KE, ADF/PF 4+/5 (improving) Sensation diminished to light touch in hands > feet (continues to improve)  Skin: Skin is warm and dry. No rash noted. He is not diaphoretic. No erythema.  Psychiatric: He has a normal mood and affect. His behavior is normal. Judgment and thought content normal.     Assessment/Plan: 1. 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 Physiatrist is providing close team supervision and 24 hour management of active medical problems listed below. Physiatrist and rehab team continue to assess barriers to discharge/monitor patient progress toward functional and medical goals.  Function:  Bathing Bathing position Bathing activity did not occur: Refused Position: Production manager parts bathed by patient: Right arm, Right upper leg, Left arm, Left upper leg, Abdomen, Chest, Front perineal area, Buttocks, Left lower leg, Back, Right lower leg    Bathing assist Assist Level: Supervision or verbal cues      Upper Body Dressing/Undressing Upper body dressing   What is the patient wearing?: Pull over shirt/dress     Pull over shirt/dress - Perfomed by patient: Thread/unthread right sleeve, Thread/unthread left sleeve, Put head through opening, Pull shirt over trunk Pull over shirt/dress - Perfomed by helper: Put head through opening, Thread/unthread left sleeve, Thread/unthread right sleeve        Upper body assist Assist Level: More than reasonable time   Set up : To obtain clothing/put away  Lower Body Dressing/Undressing Lower body dressing   What is the patient wearing?: Underwear, Pants, Socks, Shoes Underwear - Performed by patient: Thread/unthread right underwear leg, Thread/unthread  left underwear leg, Pull underwear up/down   Pants- Performed by patient: Thread/unthread right pants leg, Thread/unthread left pants leg, Pull pants up/down, Fasten/unfasten pants   Non-skid slipper socks- Performed by patient: Don/doff right sock, Don/doff left sock   Socks - Performed by patient: Don/doff right sock, Don/doff left sock                Lower body  assist Assist for lower body dressing: Supervision or verbal cues      Toileting Toileting Toileting activity did not occur: No continent bowel/bladder event Toileting steps completed by patient: Adjust clothing prior to toileting, Performs perineal hygiene, Adjust clothing after toileting   Toileting Assistive Devices: Grab bar or rail  Toileting assist Assist level: Supervision or verbal cues   Transfers Chair/bed transfer   Chair/bed transfer method: Stand pivot Chair/bed transfer assist level: Moderate assist (Pt 50 - 74%/lift or lower) Chair/bed transfer assistive device: Armrests     Locomotion Ambulation     Max distance: 100 Assist level: Touching or steadying assistance (Pt > 75%)   Wheelchair   Type: Manual Max wheelchair distance: 150 Assist Level: Supervision or verbal cues  Cognition Comprehension Comprehension assist level: Follows complex conversation/direction with extra time/assistive device  Expression Expression assist level: Expresses complex ideas: With extra time/assistive device  Social Interaction Social Interaction assist level: Interacts appropriately with others - No medications needed.  Problem Solving Problem solving assist level: Solves basic problems with no assist  Memory Memory assist level: More than reasonable amount of time    Medical Problem List and Plan: 1. 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 Will speak to neurosurgery. 2. DVT Prophylaxis/Anticoagulation: Pharmaceutical: Lovenox 3. Pain Management:    Robaxin DC'd    Gabapentin  600-- 3 times a day    PRN Oxy, decreased frequency   Lidoderm patch added on 12/22 with benefit   Will need to be cautious in administering pain medications due to systemic stimulation on 12/21 and need for Narcan for reversal of  symptoms. 4. Mood: Remains optimistic. LCSW to follow for evaluation and support.  5. Neuropsych: This patient is capable of making decisions on his own behalf. 6. Skin/Wound Care: Routine pressure relief measures. Maintain adequate nutritional and hydration status.  7. Fluids/Electrolytes/Nutrition: Monitor I/Os. Added nutritional supplement to help promote healing and for BS stabilization.  8.T2DM: Hgb A1c- 7.3 (managed by Dr. Loanne Drilling) Monitor BS ac/hs. Has insulin pump--continue lantus with meal coverage and bolus according to intake.     Elevated on 12/24 9. BPH/Neurogenic bladder:    Continue finasteride and flomax.    Bethanechol started 12/20, increased on 12/23   Cont to monitor 10 HTN: Monitor BP bid   Controlled on 12/24 11. Anxiety disorder: Managed on Buspar. 12. Hyponatremia:    Sodium 134 and 12/20, stable   Cont to monitor 13. Neutropenia: Resolved   WNL on 12/20 14. Dehydration: encourage fluid intake.   LOS (Days) 5 A FACE TO FACE EVALUATION WAS PERFORMED  Pari Lombard Lorie Phenix 02/08/2017 9:19 AM

## 2017-02-08 NOTE — Progress Notes (Signed)
Occupational Therapy Session Note  Patient Details  Name: Kenneth Pace MRN: [id number] Date of Birth: 1950-08-05  Today's Date: 02/08/2017 OT Individual Time: 6184-8592 OT Individual Time Calculation (min): 56 min    Short Term Goals: Week 1:  OT Short Term Goal 1 (Week 1): STG=LTG due to LOS  Skilled Therapeutic Interventions/Progress Updates:    1:1. Pt reporting pain in surgical site however it has been ongoingand willing to participate in tx. Pt stand pivot transfer recliner<>w/c with min HHA and Vc for hand placement and locking recliner breaks.Pt porpels w/c to/from all tx destinations with increased time 2/2 decreased grip strength. Pt completes box and blocks assessment with RUE moving 39 blocks na dLUE 42 blocks in 1 min. Pt participated in practicing handwriting with improvements in legibility with red foam handles. Pt utilizes red foam handle while handwriting score of board game and manipulating small board game pieces with increased time d/t decreased Lucky. Exited session with pt seated in recliner, call light in reach and all needs met.   Therapy Documentation Precautions:  Precautions Precautions: Fall, Cervical Precaution Comments: Cervical precautions; Aspen collar on when ambulating, up/OOB and sitting up in chair. May remove to eat, bathe or walk to BR. Required Braces or Orthoses: Cervical Brace Cervical Brace: Hard collar Restrictions Weight Bearing Restrictions: No  See Function Navigator for Current Functional Status.   Therapy/Group: Individual Therapy  Tonny Branch 02/08/2017, 11:29 AM

## 2017-02-08 NOTE — Progress Notes (Signed)
Called Ortho Tech to [employer]. States not allowed to touch anything above soft collar due to scope of practice. Bio tech will only come in if making new collar. [employer] called.

## 2017-02-08 NOTE — Progress Notes (Signed)
Neurosurgery Progress Note  Some dysphagia since surgery Difficulties swallowing pills but still able to tolerate liquids Otherwise, feels as though surgery was very helpful with improved strength/mobility  EXAM:  BP 130/79 (BP Location: Left Arm)   Pulse 90   Temp 97.7 F (36.5 C) (Oral)   Resp 18   Ht 6' (1.829 m)   Wt 98.6 kg (217 lb 6 oz)   SpO2 97%   BMI 29.48 kg/m   Awake, alert, oriented  Speech fluent, appropriate  CN grossly intact  MAEW with good strength Minimal swelling at surgical site without drainage. Mildly tender to touch  PLAN Dysphagia: still able to tolerate liquids. Will start on steroids and monitor. Advance diet as tolerable Post op: pain well controlled. Improvement in strength and mobility

## 2017-02-08 NOTE — Progress Notes (Signed)
Occupational Therapy Session Note  Patient Details  Name: Kenneth Pace MRN: [id number] Date of Birth: 09-Jun-1950  Today's Date: 02/08/2017 OT Individual Time: 0800-0900 OT Individual Time Calculation (min): 60 min    Short Term Goals: Week 1:  OT Short Term Goal 1 (Week 1): STG=LTG due to LOS  Skilled Therapeutic Interventions/Progress Updates:    Pt seen for OT ADL bathing/dressing session. Pt sitting up in recliner upon arrival, voiced haivng had a rough night with neck pain, but otherwise doing okay and agreeable to tx session. He ambulated throughout session with RW and supervision. He bathed seated ont tub bench, demonstrating good functional fine motor abilities needed to mainpulate small items. Pt very excited with functional improvements following surgery.  He returned to EOB to dress, to dress with supervision, increased time able to tie pants independently.  He ambulated to sink and completed grooming from standing position.  He then ambulated throughout unit pushing w/c in order to facilitate less UE reliance during ambulation. Completed with min A, noted narrow foot placement on R knee hyperextended with each step. He returned to recliner at end of session, left seated with al needs in reach.  Therapy Documentation Precautions:  Precautions Precautions: Fall, Cervical Precaution Comments: Cervical precautions; Aspen collar on when ambulating, up/OOB and sitting up in chair. May remove to eat, bathe or walk to BR. Required Braces or Orthoses: Cervical Brace Cervical Brace: Hard collar Restrictions Weight Bearing Restrictions: No Pain:  No/denies pain  See Function Navigator for Current Functional Status.   Therapy/Group: Individual Therapy  Teodora Baumgarten L 02/08/2017, 6:36 AM

## 2017-02-08 NOTE — Progress Notes (Addendum)
Physical Therapy Note  Patient Details  Name: Kenneth Pace MRN: [id number] Date of Birth: 08/13/1950 Today's Date: 02/08/2017  1420- 1535, 75 min , individual tx Pain: 1/10 neck  Pt has new, larger Aspen collar to accomodate swelling of neck.  He had swallowing problems earlier and vomited.    Recliner> w/c (directly opposite pt) with RW with LOB backwards requiring mod/max assist to regain.  Pt stated that his heels brushed up against each other.  W/c propulsion using bil UEs over level tile x 100' before fatiguing. Neuromuscular re-education for sustained stretch bil heels in standing on wedge with ibl UE support fading to 1UE support and standing on Airex mat during R><L wt shifting with bil UE support.  Gait training with grocery cart on level tile x 125' including turns, when pt stated he needed to sit down because bil knees shaking.  Gait noted to be less fluid, with R circumduction more pronounced than on 05-Feb-2023 when this PT last saw pt.     Sit> supine EOM with min assist for LLE.  Rolled to L/R side lying with supervision.  neuromuscular re-education via multimodal cues for 2 x 12 R/L clam shells for hip abduction, and active assistive bil hip flex/extension in side lying.    Experimented with rolled flannel sheet, etc or neck roll pillow to use in side lying, with good result.  Pt exhausted; and asked to return to bed.  Transferred back to bed with RW, min guard assist, mod assist sit> supine.  Neck roll positoining explained to [name], Therapist, sports.  Pt left in care of [name]..   See function navigator for current status.  [name] 02/08/2017, 2:48 PM

## 2017-02-09 DIAGNOSIS — R739 Hyperglycemia, unspecified: Secondary | ICD-10-CM

## 2017-02-09 DIAGNOSIS — T380X5A Adverse effect of glucocorticoids and synthetic analogues, initial encounter: Secondary | ICD-10-CM

## 2017-02-09 LAB — GLUCOSE, CAPILLARY
GLUCOSE-CAPILLARY: 232 mg/dL — AB (ref 65–99)
GLUCOSE-CAPILLARY: 176 mg/dL — AB (ref 65–99)
Glucose-Capillary: 177 mg/dL — ABNORMAL HIGH (ref 65–99)
Glucose-Capillary: 250 mg/dL — ABNORMAL HIGH (ref 65–99)

## 2017-02-09 NOTE — Progress Notes (Signed)
[facility] PHYSICAL MEDICINE & REHABILITATION     PROGRESS NOTE  Subjective/Complaints:  Pt seen sitting up in his chair this AM.  He slept well overnight.  He notes increased CBGs, but is aware that is to be expected.  He notes improvement in nausea with new collar.   ROS: Denies CP, SOB, vomiting, nausea, diarrhea.  Objective: Vital Signs: Blood pressure (!) 142/72, pulse 84, temperature 97.8 F (36.6 C), temperature source Oral, resp. rate 18, height 6' (1.829 m), weight 98.6 kg (217 lb 6 oz), SpO2 97 %. No results found. No results for input(s): WBC, HGB, HCT, PLT in the last 72 hours. No results for input(s): NA, K, CL, GLUCOSE, BUN, CREATININE, CALCIUM in the last 72 hours.  Invalid input(s): CO CBG (last 3)  Recent Labs    02/08/17 1643 02/08/17 2141 02/09/17 0529  GLUCAP 187* 189* 232*    Wt Readings from Last 3 Encounters:  02/03/17 98.6 kg (217 lb 6 oz)  02/01/17 103 kg (227 lb 1.2 oz)  02/01/17 98.6 kg (217 lb 6 oz)    Physical Exam:  BP (!) 142/72 (BP Location: Left Arm)   Pulse 84   Temp 97.8 F (36.6 C) (Oral)   Resp 18   Ht 6' (1.829 m)   Wt 98.6 kg (217 lb 6 oz)   SpO2 97%   BMI 29.48 kg/m  Constitutional: He appears well-developed and well-nourished. No distress.  HENT: Normocephalic and atraumatic.  Eyes: EOM are normal. No discharge.  Neck: Right neck with honey comb dressing and edema, stable Cardiovascular: RRR. No JVD.  Respiratory: Effort normal and breath sounds normal.  GI: He exhibits no distention. BS+  Musculoskeletal: He exhibits no edema or tenderness in extremities.  Neurological: He is alert and oriented.  Motor: B/L UE 4+/5 proximal to distal (unchanged) B/l LE: HF 4-/5, KE, ADF/PF 4+/5 (stable) Skin: Skin is warm and dry. No rash noted. He is not diaphoretic. No erythema.  Psychiatric: He has a normal mood and affect. His behavior is normal. Judgment and thought content normal.    Assessment/Plan: 1. 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 Physiatrist is providing close team supervision and 24 hour management of active medical problems listed below. Physiatrist and rehab team continue to assess barriers to discharge/monitor patient progress toward functional and medical goals.  Function:  Bathing Bathing position Bathing activity did not occur: Refused Position: Production manager parts bathed by patient: Right arm, Right upper leg, Left arm, Left upper leg, Abdomen, Chest, Front perineal area, Buttocks, Left lower leg, Back, Right lower leg    Bathing assist Assist Level: Supervision or verbal cues      Upper Body Dressing/Undressing Upper body dressing   What is the patient wearing?: Pull over shirt/dress     Pull over shirt/dress - Perfomed by patient: Thread/unthread right sleeve, Thread/unthread left sleeve, Put head through opening, Pull shirt over trunk Pull over shirt/dress - Perfomed by helper: Put head through opening, Thread/unthread left sleeve, Thread/unthread right sleeve        Upper body assist Assist Level: More than reasonable time   Set up : To obtain clothing/put away  Lower Body Dressing/Undressing Lower body dressing   What is the patient wearing?: Underwear, Pants, Socks, Shoes Underwear - Performed by patient: Thread/unthread right underwear leg, Thread/unthread left underwear leg, Pull  underwear up/down   Pants- Performed by patient: Thread/unthread right pants leg, Thread/unthread left pants leg, Pull pants up/down, Fasten/unfasten pants   Non-skid slipper socks- Performed by patient: Don/doff right sock, Don/doff left sock   Socks - Performed by patient: Don/doff right sock, Don/doff left sock                Lower body assist Assist for lower body dressing: Supervision  or verbal cues      Toileting Toileting Toileting activity did not occur: No continent bowel/bladder event Toileting steps completed by patient: Adjust clothing prior to toileting, Performs perineal hygiene, Adjust clothing after toileting   Toileting Assistive Devices: Grab bar or rail  Toileting assist Assist level: Supervision or verbal cues   Transfers Chair/bed transfer   Chair/bed transfer method: Ambulatory Chair/bed transfer assist level: Touching or steadying assistance (Pt > 75%) Chair/bed transfer assistive device: Armrests     Locomotion Ambulation     Max distance: 125 Assist level: Touching or steadying assistance (Pt > 75%)   Wheelchair   Type: Manual Max wheelchair distance: 100 Assist Level: Supervision or verbal cues  Cognition Comprehension Comprehension assist level: Follows complex conversation/direction with extra time/assistive device  Expression Expression assist level: Expresses complex ideas: With extra time/assistive device  Social Interaction Social Interaction assist level: Interacts appropriately with others - No medications needed.  Problem Solving Problem solving assist level: Solves basic problems with no assist  Memory Memory assist level: More than reasonable amount of time    Medical Problem List and Plan: 1. 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 2. DVT Prophylaxis/Anticoagulation: Pharmaceutical: Lovenox 3. Pain Management:    Robaxin DC'd    Gabapentin  600-- 3 times a day    PRN Oxy, decreased frequency   Lidoderm patch added on 12/22 with benefit   Will need to be cautious in administering pain medications due to systemic stimulation on 12/21 and need for Narcan for reversal of symptoms. 4. Mood: Remains optimistic. LCSW to follow for evaluation and support.   5. Neuropsych: This patient is capable of making decisions on his own behalf. 6. Skin/Wound Care: Routine pressure relief measures. Maintain adequate nutritional and hydration status.  7. Fluids/Electrolytes/Nutrition: Monitor I/Os. Added nutritional supplement to help promote healing and for BS stabilization.  8.T2DM: Hgb A1c- 7.3 (managed by Dr. Loanne Drilling) Monitor BS ac/hs. Has insulin pump--continue lantus with meal coverage and bolus according to intake.     Elevated on 12/25, expect changes with steroids 9. BPH/Neurogenic bladder:    Continue finasteride and flomax.    Bethanechol started 12/20, increased on 12/23   Improving   Cont to monitor 10 HTN: Monitor BP bid   Relatively controlled on 12/25 11. Anxiety disorder: Managed on Buspar. 12. Hyponatremia:    Sodium 134 and 12/20, stable   Cont to monitor 13. Neutropenia: Resolved   WNL on 12/20 14. Dehydration: encourage fluid intake.   LOS (Days) 6 A FACE TO FACE EVALUATION WAS PERFORMED  Artavius Stearns Lorie Phenix 02/09/2017 7:50 AM

## 2017-02-10 ENCOUNTER — Inpatient Hospital Stay (HOSPITAL_COMMUNITY): Payer: BLUE CROSS/BLUE SHIELD | Admitting: Occupational Therapy

## 2017-02-10 ENCOUNTER — Inpatient Hospital Stay (HOSPITAL_COMMUNITY): Payer: BLUE CROSS/BLUE SHIELD | Admitting: Physical Therapy

## 2017-02-10 ENCOUNTER — Inpatient Hospital Stay (HOSPITAL_COMMUNITY): Payer: BLUE CROSS/BLUE SHIELD

## 2017-02-10 LAB — GLUCOSE, CAPILLARY
GLUCOSE-CAPILLARY: 255 mg/dL — AB (ref 65–99)
Glucose-Capillary: 158 mg/dL — ABNORMAL HIGH (ref 65–99)
Glucose-Capillary: 99 mg/dL (ref 65–99)
Glucose-Capillary: 148 mg/dL — ABNORMAL HIGH (ref 65–99)

## 2017-02-10 MED ORDER — LIDOCAINE 5 % EX PTCH
1.0000 | MEDICATED_PATCH | CUTANEOUS | Status: DC
  Administered 2017-02-11 – 2017-02-12 (×2): 1 via TRANSDERMAL
  Filled 2017-02-10 (×2): qty 1

## 2017-02-10 MED ORDER — GABAPENTIN 600 MG PO TABS
300.0000 mg | ORAL_TABLET | Freq: Three times a day (TID) | ORAL | Status: DC
Start: 2017-02-10 — End: 2017-02-12
  Administered 2017-02-10 – 2017-02-12 (×7): 300 mg via ORAL
  Filled 2017-02-10 (×7): qty 1

## 2017-02-10 NOTE — Progress Notes (Signed)
[facility] PHYSICAL MEDICINE & REHABILITATION     PROGRESS NOTE  Subjective/Complaints:  Pt seen sitting in his chair this AM.  He slept well overnight.  He asks me to fill out FMLA paperwork for his wife.  He also notes misplacement of lidoderm patch.   ROS: Denies CP, SOB, vomiting, nausea, diarrhea.  Objective: Vital Signs: Blood pressure 123/67, pulse 77, temperature 97.6 F (36.4 C), temperature source Oral, resp. rate 16, height 6' (1.829 m), weight 98.6 kg (217 lb 6 oz), SpO2 99 %. No results found. No results for input(s): WBC, HGB, HCT, PLT in the last 72 hours. No results for input(s): NA, K, CL, GLUCOSE, BUN, CREATININE, CALCIUM in the last 72 hours.  Invalid input(s): CO CBG (last 3)  Recent Labs    02/09/17 1632 02/09/17 2105 02/10/17 0630  GLUCAP 177* 250* 158*    Wt Readings from Last 3 Encounters:  02/03/17 98.6 kg (217 lb 6 oz)  02/01/17 103 kg (227 lb 1.2 oz)  02/01/17 98.6 kg (217 lb 6 oz)    Physical Exam:  BP 123/67 (BP Location: Left Arm)   Pulse 77   Temp 97.6 F (36.4 C) (Oral)   Resp 16   Ht 6' (1.829 m)   Wt 98.6 kg (217 lb 6 oz)   SpO2 99%   BMI 29.48 kg/m  Constitutional: He appears well-developed and well-nourished. No distress.  HENT: Normocephalic and atraumatic.  Eyes: EOM are normal. No discharge.  Neck: Right neck with honey comb dressing and edema, improving Cardiovascular: RRR. No JVD.  Respiratory: Effort normal and breath sounds normal.  GI: He exhibits no distention. BS+  Musculoskeletal: He exhibits no edema or tenderness in extremities.  Neurological: He is alert and oriented.  Motor: B/L UE 4+/5 proximal to distal (stable) B/l LE: HF 4-/5, KE, ADF/PF 4+/5 (stable) Skin: Skin is warm and dry. No rash noted. He is not diaphoretic. No erythema.  Psychiatric: He has a normal mood and affect. His behavior is normal. Judgment and thought content normal.    Assessment/Plan: 1. Functional deficits secondary to demyelinating  myeloneuropathy and cervical stenosis with cord compression--s/p ACDF  C3-C5 which require 3+ hours per day of interdisciplinary therapy in a comprehensive inpatient rehab setting. Physiatrist is providing close team supervision and 24 hour management of active medical problems listed below. Physiatrist and rehab team continue to assess barriers to discharge/monitor patient progress toward functional and medical goals.  Function:  Bathing Bathing position Bathing activity did not occur: Refused Position: Production manager parts bathed by patient: Right arm, Right upper leg, Left arm, Left upper leg, Abdomen, Chest, Front perineal area, Buttocks, Left lower leg, Back, Right lower leg    Bathing assist Assist Level: Supervision or verbal cues      Upper Body Dressing/Undressing Upper body dressing   What is the patient wearing?: Pull over shirt/dress     Pull over shirt/dress - Perfomed by patient: Thread/unthread right sleeve, Thread/unthread left sleeve, Put head through opening, Pull shirt over trunk Pull over shirt/dress - Perfomed by helper: Put head through opening, Thread/unthread left sleeve, Thread/unthread right sleeve        Upper body assist Assist Level: More than reasonable time   Set up : To obtain clothing/put away  Lower Body Dressing/Undressing Lower body dressing   What is the patient wearing?: Underwear, Pants, Socks, Shoes Underwear - Performed by patient: Thread/unthread right underwear leg, Thread/unthread left underwear leg, Pull underwear up/down   Pants-  Performed by patient: Thread/unthread right pants leg, Thread/unthread left pants leg, Pull pants up/down, Fasten/unfasten pants   Non-skid slipper socks- Performed by patient: Don/doff right sock, Don/doff left sock   Socks - Performed by patient: Don/doff right sock, Don/doff left sock                Lower body assist Assist for lower body dressing: Supervision or verbal cues       Toileting Toileting Toileting activity did not occur: No continent bowel/bladder event Toileting steps completed by patient: Adjust clothing prior to toileting, Performs perineal hygiene, Adjust clothing after toileting   Toileting Assistive Devices: Grab bar or rail  Toileting assist Assist level: Touching or steadying assistance (Pt.75%)   Transfers Chair/bed transfer   Chair/bed transfer method: Ambulatory Chair/bed transfer assist level: Touching or steadying assistance (Pt > 75%) Chair/bed transfer assistive device: Armrests     Locomotion Ambulation     Max distance: 125 Assist level: Touching or steadying assistance (Pt > 75%)   Wheelchair   Type: Manual Max wheelchair distance: 100 Assist Level: Supervision or verbal cues  Cognition Comprehension Comprehension assist level: Follows complex conversation/direction with extra time/assistive device  Expression Expression assist level: Expresses complex ideas: With extra time/assistive device  Social Interaction Social Interaction assist level: Interacts appropriately with others - No medications needed.  Problem Solving Problem solving assist level: Solves basic problems with no assist  Memory Memory assist level: More than reasonable amount of time    Medical Problem List and Plan: 1. 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 2. DVT Prophylaxis/Anticoagulation: Pharmaceutical: Lovenox 3. Pain Management:    Robaxin DC'd    Gabapentin  600-- 3 times a day    PRN Oxy, decreased frequency   Lidoderm patch added on 12/22 with benefit   Will need to be cautious in administering pain medications due to systemic stimulation on 12/21 and need for Narcan for reversal of symptoms. 4. Mood: Remains optimistic. LCSW to follow for evaluation and support.  5.  Neuropsych: This patient is capable of making decisions on his own behalf. 6. Skin/Wound Care: Routine pressure relief measures. Maintain adequate nutritional and hydration status.  7. Fluids/Electrolytes/Nutrition: Monitor I/Os. Added nutritional supplement to help promote healing and for BS stabilization.  8.T2DM: Hgb A1c- 7.3 (managed by Dr. Loanne Drilling) Monitor BS ac/hs. Has insulin pump--continue lantus with meal coverage and bolus according to intake.     Elevated on 12/26, expect elevation with steroids 9. BPH/Neurogenic bladder:    Continue finasteride and flomax.    Bethanechol started 12/20, increased on 12/23   Improving   Cont to monitor 10 HTN: Monitor BP bid   Relatively controlled on 12/26 11. Anxiety disorder: Managed on Buspar. 12. Hyponatremia:    Sodium 134 and 12/20, stable   Cont to monitor 13. Neutropenia: Resolved   WNL on 12/20 14. Dehydration: encourage fluid intake.   LOS (Days) 7 A FACE TO FACE EVALUATION WAS PERFORMED  Ankit Lorie Phenix 02/10/2017 8:26 AM

## 2017-02-10 NOTE — Progress Notes (Signed)
Physical Therapy Session Note  Patient Details  Name: FIRMIN BELISLE MRN: [id number] Date of Birth: 1950/06/28  Today's Date: 02/10/2017 PT Individual Time: 1025-1110 PT Individual Time Calculation (min): 45 min   Short Term Goals: Week 1:  PT Short Term Goal 1 (Week 1): = LTGs due to ELOS  Skilled Therapeutic Interventions/Progress Updates:    Patient in w/c in room sit to stand minguard A and pt ambulated with RW and minguard to therapy gym.  Standing balance in parallel bars to include forward march, side stepping, balance without UE support 30 sec trials, balance on beam with A/P orientation 2 x 30 sec trials, and with tandem standing on beam 3 x 30 sec trials.  Also performed heel and toe walking.  Noted increased effort needed for all activities with fatigue.  Pt propelled w/c to room with S and increased time.  Left in w/c with all needs in reach.   Therapy Documentation Precautions:  Precautions Precautions: Fall, Cervical Precaution Comments: Cervical precautions; Aspen collar on when ambulating, up/OOB and sitting up in chair. May remove to eat, bathe or walk to BR. Required Braces or Orthoses: Cervical Brace Cervical Brace: Hard collar Restrictions Weight Bearing Restrictions: No Pain: Pain Assessment Faces Pain Scale: Hurts a little bit Pain Location: Neck Pain Descriptors / Indicators: Sore Pain Intervention(s): Rest   See Function Navigator for Current Functional Status.   Therapy/Group: Individual Therapy  Reginia Naas 02/10/2017, 4:59 PM

## 2017-02-10 NOTE — Progress Notes (Signed)
Occupational Therapy Session Note  Patient Details  Name: Kenneth Pace MRN: [id number] Date of Birth: [birth year]  Today's Date: 02/10/2017 OT Individual Time: 1110-1200 OT Individual Time Calculation (min): 50 min    Short Term Goals: Week 1:  OT Short Term Goal 1 (Week 1): STG=LTG due to LOS  Skilled Therapeutic Interventions/Progress Updates:    Pt seen for OT ADL bathing/dressing session. Pt sitting up in w/c upon arrival, voicing increased fatigue from previous sessions though agreeable to tx session. He ambulated throughout room with RW and supervision, completing functional transfers in same manner. He bathed seated on tub bench mod I. Returned to EOB to dress, standing without AD to pull pants up with supervision. He returned to recliner and completed 9 hole peg test, see results below. Pt excited with improvements from last time assessment completed last week. Meal tray arrived and pt able to complete set-up and self feeding of spaghetti and plastic containers independently. Pt left sitting in recliner to finish lunch, all needs in reach.   9 Hole Peg Test R: 37.23, 39.83, and 37.01     L: 48.43, 40.28, and 35.03  Therapy Documentation Precautions:  Precautions Precautions: Fall, Cervical Precaution Comments: Cervical precautions; Aspen collar on when ambulating, up/OOB and sitting up in chair. May remove to eat, bathe or walk to BR. Required Braces or Orthoses: Cervical Brace Cervical Brace: Hard collar Restrictions Weight Bearing Restrictions: No Pain:   No/ denies pain  See Function Navigator for Current Functional Status.   Therapy/Group: Individual Therapy  Illana Nolting L 02/10/2017, 7:11 AM

## 2017-02-10 NOTE — Patient Care Conference (Signed)
Inpatient RehabilitationTeam Conference and Plan of Care Update Date: 02/10/2017   Time: 11:00 AM    Patient Name: Kenneth Pace      Medical Record Number: [id number]  Date of Birth: 12-Jul-1950 Sex: Male         Room/Bed: 4M05C/4M05C-01 Payor Info: Payor: BLUE CROSS BLUE SHIELD / Plan: BCBS OTHER / Product Type: *No Product type* /    Admitting Diagnosis: [place] ACDE  Admit Date/Time:  02/03/2017  5:03 PM Admission Comments: No comment available   Primary Diagnosis:  <principal problem not specified> Principal Problem: <principal problem not specified>  Patient Active Problem List   Diagnosis Date Noted  . Steroid-induced hyperglycemia   . Urinary retention   . Accidental drug overdose   . Diabetes mellitus type 2 in obese ([facility])   . Postoperative pain   . Diabetes mellitus type 2 in nonobese ([facility])   . Hyponatremia   . Cervical myelopathy ([facility]) 02/01/2017  . Shortness of breath at rest   . Hypoglycemia   . Spondylosis, cervical, with myelopathy   . Neuropathic pain   . Benign essential HTN   . Labile blood glucose   . Muscle spasm   . Type 2 diabetes mellitus with peripheral neuropathy ([facility])   . Generalized anxiety disorder   . Acute inflammatory demyelinating polyneuropathy ([facility]) 01/11/2017  . Anxiety state   . Neurogenic bladder   . Depression 12/30/2016  . Leg weakness, bilateral 12/30/2016  . 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 GBS (Guillain Barre syndrome) ([facility])   . AIDP (acute inflammatory demyelinating polyneuropathy) ([facility]) 11/27/2016  . Weakness 11/20/2016  . Weakness of both arms 10/30/2016  . Numbness 03/18/2016  . Diabetes mellitus without complication ([facility]) 95/62/1308  . Eustachian tube dysfunction 02/12/2015  . Acute maxillary sinusitis 02/12/2015  . Wellness examination 08/22/2014  . Alkaline phosphatase elevation 08/22/2014  . Screening for prostate cancer 04/24/2013  . Routine general medical  examination at a health care facility 04/10/2012  . BPH (benign prostatic hyperplasia) 02/03/2010  . HEMOCCULT POSITIVE STOOL 05/25/2008  . ELBOW PAIN, RIGHT 07/04/2007  . Dyslipidemia 02/16/2007  . ANXIETY 02/16/2007  . ERECTILE DYSFUNCTION 02/16/2007  . Essential hypertension 02/16/2007  . TESTICULAR MASS, LEFT 02/16/2007  . KNEE PAIN, CHRONIC 02/16/2007  . ADENOIDECTOMY, HX OF 02/16/2007    Expected Discharge Date: Expected Discharge Date: 02/12/17  Team Members Present: Physician leading conference: Dr. 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    Mobility   supervision for transfers, mod A for LOB during gait  mod I gait and transfers, supervision stairs  strength, family ed, d/c planning   Communication  Safety/Cognition/ Behavioral Observations  free from injury  maintain   safe transfers with required equipment per safety plan   Pain   prn tramadol 50mg  q6; oxy 5mg  q6; gabapentin scheduled  3/10  assess pain q shift and prn and admin   Skin   insulin pump to RLQ and LLQ; honeycomb dressing to R neck  free from skin breakdown and infection   assess skin q shift and prn    Rehab Goals Patient on target to meet rehab goals: Yes *See Care Plan and progress notes for long and short-term goals.     Barriers to Discharge  Current Status/Progress Possible Resolutions Date Resolved   Physician    Medical stability;Other (comments)  Urinary retention  See above  Therapies, insulin pump being managed by pt, steroids for edema per NS      Nursing                  PT                    OT                  SLP                SW                Discharge Planning/Teaching Needs:  Plan for pt to d/c home with wife who can provide supervision  Teaching needs TBD   Team Discussion:  Progressing toward his goals of mod/i-supervision level. Voiding now has not bee cathed for two days. Pt wants MD to decrease neurontin to home dose. Neuro-surgery started on steroids when saw last Friday. Medically stbale for discharge Friday.  Revisions to Treatment Plan:  DC 12/28    Continued Need for Acute Rehabilitation Level of Care: The patient requires daily medical management by a physician with specialized training in physical medicine and rehabilitation for the following conditions: Daily direction of a multidisciplinary physical rehabilitation program to ensure safe treatment while eliciting the highest outcome that is of practical value to the patient.: Yes Daily medical management of patient stability for increased activity during participation in an intensive rehabilitation regime.: Yes Daily analysis of laboratory values and/or radiology reports with any subsequent need for medication adjustment of medical intervention for : Neurological problems;Diabetes problems;Other  Fuller Makin, Gardiner Rhyme 02/10/2017, 1:47 PM

## 2017-02-10 NOTE — Progress Notes (Signed)
Social Work Patient ID: Kenneth Pace, male   DOB: 08/08/1950, 66 y.o.   MRN: [id number]   Met with pt and spoke with wife via telephone to discuss team conference goals-mod/i-supervision and target discharge date 12/28. Both very pleased with this plan. Have faxed FMLA forms for wife and left originals in his room. Both want him to resume OP at [facility]. Work on discharge needs.  

## 2017-02-10 NOTE — Progress Notes (Signed)
Occupational Therapy Session Note  Patient Details  Name: Kenneth Pace MRN: [id number] Date of Birth: 04-23-1950  Today's Date: 02/10/2017 OT Individual Time: 1500-1530 OT Individual Time Calculation (min): 30 min    Short Term Goals: Week 1:  OT Short Term Goal 1 (Week 1): STG=LTG due to LOS  Skilled Therapeutic Interventions/Progress Updates:    Pt presents supine in bed, reports some pain in neck region though agreeable to OT tx session. Pt completes bed mobility with S, stand pivot EOB>w/c with MinA (HHA). Pt self propels w/c to therapy gym ModI with increased time/effort. Engages in standing fine motor activities of card sorting, alternating between using L and R UE to place cards. Pt standing at RW though completing majority of task without UE support and with close MinGuard for static balance. Pt occassionally requires MinA when performing increased trunk rotation to obtain a card. Pt completes standing game of tic-tac-toe, initially using built up handle on marker, progressing towards use of marker without built up handle for increased challenge. Pt with intermittent seated rest breaks throughout, completing sit<>stand with MinGuard. Transported pt back to room via w/c for time management, Pt transfers stand pivot to EOB with MinA, S for transfer EOB>supine. Pt left supine in bed, call bell and needs within reach.   Therapy Documentation Precautions:  Precautions Precautions: Fall, Cervical Precaution Comments: Cervical precautions; Aspen collar on when ambulating, up/OOB and sitting up in chair. May remove to eat, bathe or walk to BR. Required Braces or Orthoses: Cervical Brace Cervical Brace: Hard collar Restrictions Weight Bearing Restrictions: No   See Function Navigator for Current Functional Status.   Therapy/Group: Individual Therapy  Raymondo Band 02/10/2017, 4:11 PM

## 2017-02-10 NOTE — Progress Notes (Signed)
Inpatient Diabetes Program Recommendations  AACE/ADA: New Consensus Statement on Inpatient Glycemic Control (2015)  Target Ranges:  Prepandial:   less than 140 mg/dL      Peak postprandial:   less than 180 mg/dL (1-2 hours)      Critically ill patients:  140 - 180 mg/dL   Lab Results  Component Value Date   GLUCAP 255 (H) 02/10/2017   HGBA1C 7.3 (H) 11/20/2016    Review of Glycemic ControlResults for TYTON, ABDALLAH (MRN [id number]) as of 02/10/2017 13:41  Ref. Range 02/09/2017 11:39 02/09/2017 16:32 02/09/2017 21:05 02/10/2017 06:30 02/10/2017 12:07  Glucose-Capillary Latest Ref Range: 65 - 99 mg/dL 176 (H) 177 (H) 250 (H) 158 (H) 255 (H)   --Insulin Pump Settings--  Basal Rates: MN to 6am- 2.75 units/hr (basal rate reduced by patient on 02/04/17 due to lows) 6am-MN- 3.0 units/hr  Total Basal Insulin per 24 hours period=70.5units  Carbohydrate Ratio:1 unit for every 12-15Grams of Carbohydrates  Correction/Sensitivity Factor:1 unit for every 50mg /dl above Target CBG  Target CBG:100 mg/dl  Note patient transferred back to CIR.  Blood sugars mostly <180 mg/dL.  MD please d/c Novolog moderate correction since patient is using insulin pump for correction. Discussed with primary RN.  She states that patient is doing well and independent with use of insulin pump.    Thanks,  Adah Perl, RN, BC-ADM Inpatient Diabetes Coordinator Pager [phone] (8a-5p)

## 2017-02-10 NOTE — Plan of Care (Signed)
  Not Progressing SCI BLADDER ELIMINATION RH STG MANAGE BLADDER WITH ASSISTANCE Description STG Manage Bladder With [place]   02/10/2017 1653 - Not Progressing by Brita Romp, RN 02/10/2017 1651 - Reactivated by Brita Romp, RN   Pt with urinary retention, requiring bladder scans, I&O cath.

## 2017-02-10 NOTE — Progress Notes (Signed)
Physical Therapy Session Note  Patient Details  Name: BUCKY GRIGG MRN: [id number] Date of Birth: 1950/08/13  Today's Date: 02/10/2017 PT Individual Time: 1331-1400 PT Individual Time Calculation (min): 29 min   Short Term Goals: Week 1:  PT Short Term Goal 1 (Week 1): = LTGs due to ELOS  Skilled Therapeutic Interventions/Progress Updates:    Pt supine in bed upon PT arrival, agreeable to therapy tx and denies pain. Pt transferred from supine>sitting EOB with supervision. Pt transferred bed>w/c stand pivot with min assist. Pt propelled w/c from room>gym with supervision. Pt worked on dynamic standing balance with and without UE support to perform toe taps on 4 inch step and stepping in different direction, min assist. Pt ambulated back to room with close supervision using RW, x 150 ft. Pt left supine in bed at end of session with needs in reach.   Therapy Documentation Precautions:  Precautions Precautions: Fall, Cervical Precaution Comments: Cervical precautions; Aspen collar on when ambulating, up/OOB and sitting up in chair. May remove to eat, bathe or walk to BR. Required Braces or Orthoses: Cervical Brace Cervical Brace: Hard collar Restrictions Weight Bearing Restrictions: No   See Function Navigator for Current Functional Status.   Therapy/Group: Individual Therapy  Netta Corrigan, PT, DPT 02/10/2017, 2:28 PM

## 2017-02-10 NOTE — Progress Notes (Signed)
Physical Therapy Note  Patient Details  Name: Kenneth Pace MRN: [id number] Date of Birth: May 12, 1950 Today's Date: 02/10/2017    Time: 830-926 56 minutes  1:1 Pt with no c/o pain.  Pt performs bed mobility with supervision, requires min A to don cervical collar.  Sit to stand and gait with supervision in controlled environment with RW x 150'.  Stair negotiation with min guard 2 x 4 stairs with bilat handrails.  Supine NMR for core and hip strengthening with bridging, bridge with ball squeeze, hooklying marching, hooklying knee ext with ball squeeze.  Sit to stands with focus on eccentric control, sit to stands with ball squeeze. W/c mobility throughout unit and in room with mod I. Pt given HEP for core and hip strengthening, pt verbalizes understanding.   Jaonna Word 02/10/2017, 9:27 AM

## 2017-02-11 ENCOUNTER — Inpatient Hospital Stay (HOSPITAL_COMMUNITY): Payer: BLUE CROSS/BLUE SHIELD | Admitting: Physical Therapy

## 2017-02-11 ENCOUNTER — Inpatient Hospital Stay (HOSPITAL_COMMUNITY): Payer: BLUE CROSS/BLUE SHIELD | Admitting: Occupational Therapy

## 2017-02-11 ENCOUNTER — Inpatient Hospital Stay (HOSPITAL_COMMUNITY): Payer: BLUE CROSS/BLUE SHIELD

## 2017-02-11 DIAGNOSIS — R7309 Other abnormal glucose: Secondary | ICD-10-CM

## 2017-02-11 DIAGNOSIS — K5901 Slow transit constipation: Secondary | ICD-10-CM

## 2017-02-11 DIAGNOSIS — M4712 Other spondylosis with myelopathy, cervical region: Secondary | ICD-10-CM

## 2017-02-11 DIAGNOSIS — G610 Guillain-Barre syndrome: Secondary | ICD-10-CM

## 2017-02-11 LAB — GLUCOSE, CAPILLARY
GLUCOSE-CAPILLARY: 133 mg/dL — AB (ref 65–99)
GLUCOSE-CAPILLARY: 107 mg/dL — AB (ref 65–99)
GLUCOSE-CAPILLARY: 137 mg/dL — AB (ref 65–99)
GLUCOSE-CAPILLARY: 254 mg/dL — AB (ref 65–99)

## 2017-02-11 MED ORDER — POLYETHYLENE GLYCOL 3350 17 G PO PACK: 17.0000 g | PACK | Freq: Every day | ORAL | Status: DC

## 2017-02-11 NOTE — Progress Notes (Addendum)
Physical Therapy Note  Patient Details  Name: Kenneth Pace MRN: [id number] Date of Birth: February 15, 1951 Today's Date: 02/11/2017  1335-1453, 78 min individual tx Pain: none per pt  Bed mobility to sit up and don Aspen collar with min assist for comfortable snug fit.  Stand pivot with RW to w/c modified independent. Gait training over mulch with RW with min guard assist, cues to keep feet wider during turns. W/c propulsion over level tile moidfied independent with extra time.  Neuromuscular re-education via demo, multimodal cues for alternating reciprocal movement x bil LEs in sitting in w/c using Kinetron at level 60 x 25 cycles x3.  R/L hip abduction in side lying for clam shells, x 12, x 10, x 8.  bil hip flex/ext in R side lying x 12, x 10, x 8.  R/L unilateral bridging with LE hanging off of mat to work from + hip extension to hip flexion range, 2 x 10.  Hand-out provided per pt request for unilateral bridging ex.  Pt requested getting back in bed; left resting with all needs within reach.   Pt's wife plans to be here tomorrow at 1:30 for family ed.  Scheduling informed.  See function navigator for current status.   Brynnan Rodenbaugh 02/11/2017, 2:21 PM

## 2017-02-11 NOTE — Progress Notes (Signed)
Occupational Therapy Discharge Summary  Patient Details  Name: Kenneth Pace MRN: [id number] Date of Birth: [birth year]   Patient has met 67 of 10 long term goals due to improved activity tolerance, improved balance, postural control, ability to compensate for deficits, functional use of  RIGHT upper and LEFT upper extremity and improved coordination.  Patient to discharge at overall Modified Independent level.  Patient's care partner is independent to provide the necessary physical assistance at discharge.  Patient has made excellent progress while in inpatient rehab. He is mod I overall with use of RW. Pt's wife is aware of pt's currently level of function and can provide assist as needed.     Recommendation:  Patient will benefit from ongoing skilled OT services in outpatient setting to continue to advance functional skills in the area of BADL, iADL and Reduce care partner burden.  Equipment: Pt has tub transfer bench, recommend to cont to use at d/c  Reasons for discharge: treatment goals met and discharge from hospital  Patient/family agrees with progress made and goals achieved: Yes  OT Discharge Precautions/Restrictions  Precautions Precautions: Fall;Cervical Precaution Comments: Cervical precautions; Aspen collar on when ambulating, up/OOB and sitting up in chair. May remove to eat, bathe or walk to BR. Required Braces or Orthoses: Cervical Brace Cervical Brace: Hard collar Restrictions Weight Bearing Restrictions: No Vision Baseline Vision/History: Wears glasses Patient Visual Report: No change from baseline Vision Assessment?: No apparent visual deficits Perception  Perception: Within Functional Limits Praxis Praxis: Intact Cognition Overall Cognitive Status: Within Functional Limits for tasks assessed Arousal/Alertness: Awake/alert Orientation Level: Oriented X4 Memory: Appears intact Awareness: Appears intact Problem Solving: Appears intact Safety/Judgment:  Appears intact Sensation Sensation Light Touch: Impaired Detail Light Touch Impaired Details: Impaired RUE;Impaired LUE(Ulnar deviation area in hand and forearm) Stereognosis: Appears Intact Hot/Cold: Appears Intact Proprioception: Appears Intact Coordination Gross Motor Movements are Fluid and Coordinated: No Fine Motor Movements are Fluid and Coordinated: No Coordination and Movement Description: Ataxic with decreased coordination and weakness Finger Nose Finger Test: [facility] 9 Hole Peg Test: R: 37.23, 39.83, and 37.01     L: 48.43, 40.28, and 35.03 Motor  Motor Motor: Abnormal postural alignment and control;Ataxia Motor - Discharge Observations: ataxia in LEs when fatigued, decreased core strength Trunk/Postural Assessment  Cervical Assessment Cervical Assessment: Exceptions to [facility] Cervical AROM Overall Cervical AROM Comments: cervical precautions; hard collar with mobility Thoracic Assessment Thoracic Assessment: Within Functional Limits Lumbar Assessment Lumbar Assessment: Exceptions to WFL(Posterior pelvic tilt) Postural Control Postural Control: Within Functional Limits Trunk Control: Core weakness/ instability  Balance Balance Balance Assessed: Yes Static Sitting Balance Static Sitting - Balance Support: Feet supported Static Sitting - Level of Assistance: 7: Independent Dynamic Sitting Balance Dynamic Sitting - Balance Support: During functional activity Dynamic Sitting - Level of Assistance: 7: Independent Static Standing Balance Static Standing - Balance Support: During functional activity;No upper extremity supported Static Standing - Level of Assistance: 6: Modified independent (Device/Increase time) Static Standing - Comment/# of Minutes: Standing to complete dressing and toileting tasks Dynamic Standing Balance Dynamic Standing - Balance Support: During functional activity Dynamic Standing - Level of Assistance: 6: Modified independent (Device/Increase  time) Dynamic Standing - Comments: Standing to complete dressing/ toileting tasks Extremity/Trunk Assessment RUE Assessment RUE Assessment: Within Functional Limits RUE Strength RUE Overall Strength: (Strength 4+/5 throughout (shoulder not assessed due to cervial pre-cautions)) LUE Assessment LUE Assessment: Within Functional Limits(Strength 4+/5 throughout (shoulder not assessed due to cervial pre-cautions))   See Function Navigator for Current Functional Status.  Barbie Croston L 02/11/2017, 12:46 PM

## 2017-02-11 NOTE — Progress Notes (Signed)
Physical Therapy Discharge Summary  Patient Details  Name: Kenneth Pace MRN: [id number] Date of Birth: [birth year]    Patient has met 5 of 7 long term goals due to improved activity tolerance, improved balance, improved postural control, increased strength and ability to compensate for deficits.  Patient to discharge at an ambulatory level Supervision.   Patient's care partner resistant to family education but was educated on stair negotiation and w/c parts management.  Reasons goals not met: pt requires supervision for long distance gait due to fatigue  Recommendation:  Patient will benefit from ongoing skilled PT services in outpatient setting to continue to advance safe functional mobility, address ongoing impairments in strength, gait, coordination, balance, and minimize fall risk.  Equipment: w/c  Reasons for discharge: treatment goals met and discharge from hospital  Patient/family agrees with progress made and goals achieved: Yes  PT Discharge Precautions/Restrictions Precautions Precautions: Fall;Cervical Required Braces or Orthoses: Cervical Brace Cervical Brace: Hard collar Restrictions Weight Bearing Restrictions: No Pain Pain Assessment Faces Pain Scale: Hurts a little bit Pain Type: Acute pain Pain Location: Back Pain Orientation: Right;Left Pain Descriptors / Indicators: Aching;Sore Pain Intervention(s): Repositioned;Rest  Cognition Overall Cognitive Status: Within Functional Limits for tasks assessed Arousal/Alertness: Awake/alert Sensation Sensation Light Touch Impaired Details: Impaired RUE;Impaired LUE Proprioception: Appears Intact Coordination Gross Motor Movements are Fluid and Coordinated: No Fine Motor Movements are Fluid and Coordinated: No Coordination and Movement Description: Ataxic with decreased coordination and weakness Motor  Motor Motor: Abnormal postural alignment and control;Ataxia Motor - Discharge Observations: ataxia in  LEs when fatigued, decreased core strength   Trunk/Postural Assessment  Cervical AROM Overall Cervical AROM Comments: cervical precautions; hard collar with mobility Thoracic Assessment Thoracic Assessment: Within Functional Limits Lumbar Assessment Lumbar Assessment: Exceptions to WFL(posterior pelvic tilt) Postural Control Trunk Control: trunk weakness   Balance Static Sitting Balance Static Sitting - Level of Assistance: 7: Independent Dynamic Sitting Balance Dynamic Sitting - Level of Assistance: 7: Independent Static Standing Balance Static Standing - Level of Assistance: 6: Modified independent (Device/Increase time) Dynamic Standing Balance Dynamic Standing - Level of Assistance: 5: Stand by assistance Extremity Assessment      RLE Strength RLE Overall Strength Comments: hip flex 3/5, knee ext 4-/5  ankle DF 4-/5 LLE Strength LLE Overall Strength Comments: hip flex 3/5; knee ext 4+/5, ankle DF 4/5   See Function Navigator for Current Functional Status.  Gavan Nordby 02/11/2017, 10:58 AM

## 2017-02-11 NOTE — Discharge Summary (Signed)
Physician Discharge Summary  Patient ID: NATANEL SNAVELY MRN: [id number] DOB/AGE: 64-Jan-1952 64 y.o.  Admit date: 02/03/2017 Discharge date: 02/12/2017  Discharge Diagnoses:  Principal Problem:   Cervical myelopathy ([place]) Active Problems:   Acute inflammatory demyelinating polyneuropathy (HCC)   Postoperative pain   Diabetes mellitus type 2 in obese [facility])   Accidental drug overdose   Urinary retention   Steroid-induced hyperglycemia   Slow transit constipation   Hypokalemia   Discharged Condition: stable  Significant Diagnostic Studies: Dg Chest 2 View  Result Date: 01/25/2017 CLINICAL DATA:  Shortness of breath and squeezing chest discomfort. EXAM: CHEST  2 VIEW COMPARISON:  Chest x-ray and CT of the chest on 01/01/2017 FINDINGS: The heart size and mediastinal contours are within normal limits. Stable scarring in the lingula. There is no evidence of pulmonary edema, consolidation, pneumothorax, nodule or pleural fluid. The thoracic spine demonstrates degenerative disc disease. IMPRESSION: No active cardiopulmonary disease. Electronically Signed   By: Aletta Edouard M.D.   On: 01/25/2017 15:00   Dg Cervical Spine 2-3 Views  Result Date: 02/01/2017 CLINICAL DATA:  C3-C6 ACDF. EXAM: CERVICAL SPINE - 2-3 VIEW; DG C-ARM 61-120 MIN COMPARISON:  MRI cervical spine dated December 31, 2016. FINDINGS: Several lateral intraoperative x-rays were obtained. The first x-ray demonstrates surgical instrumentation in the C3-C4 disc space. The remaining x-rays demonstrate interval C3-C6 ACDF with interbody disc spacers. No evidence of hardware complication. Alignment is normal. IMPRESSION: 1. Interval C3-C6 ACDF. FLUOROSCOPY TIME:  9 seconds. C-arm fluoroscopic images were obtained intraoperatively and submitted for post operative interpretation. Electronically Signed   By: Titus Dubin M.D.   On: 02/01/2017 16:37   Dg C-arm 1-60 Min  Result Date: 02/01/2017 CLINICAL DATA:  C3-C6 ACDF.  EXAM: CERVICAL SPINE - 2-3 VIEW; DG C-ARM 61-120 MIN COMPARISON:  MRI cervical spine dated December 31, 2016. FINDINGS: Several lateral intraoperative x-rays were obtained. The first x-ray demonstrates surgical instrumentation in the C3-C4 disc space. The remaining x-rays demonstrate interval C3-C6 ACDF with interbody disc spacers. No evidence of hardware complication. Alignment is normal. IMPRESSION: 1. Interval C3-C6 ACDF. FLUOROSCOPY TIME:  9 seconds. C-arm fluoroscopic images were obtained intraoperatively and submitted for post operative interpretation. Electronically Signed   By: Titus Dubin M.D.   On: 02/01/2017 16:37    Labs:  Basic Metabolic Panel: BMP Latest Ref Rng & Units 02/12/2017 02/04/2017 02/01/2017  Glucose 65 - 99 mg/dL 118(H) 105(H) 171(H)  BUN 6 - 20 mg/dL 19 17 25(H)  Creatinine 0.61 - 1.24 mg/dL 0.83 0.81 0.82  Sodium 135 - 145 mmol/L 136 134(L) 133(L)  Potassium 3.5 - 5.1 mmol/L 3.4(L) 3.6 3.6  Chloride 101 - 111 mmol/L 98(L) 100(L) 101  CO2 22 - 32 mmol/L '29 28 24  ' Calcium 8.9 - 10.3 mg/dL 9.2 8.8(L) 8.7(L)    CBC: CBC Latest Ref Rng & Units 02/12/2017 02/04/2017 01/26/2017  WBC 4.0 - 10.5 K/uL 8.5 7.6 2.9(L)  Hemoglobin 13.0 - 17.0 g/dL 15.2 13.6 14.1  Hematocrit 39.0 - 52.0 % 43.9 40.6 41.1  Platelets 150 - 400 K/uL 346 173 170    CBG: Recent Labs  Lab 02/11/17 1158 02/11/17 1741 02/11/17 2006 02/12/17 0646 02/12/17 1131  GLUCAP 107* 137* 254* 157* 174*    Brief HPI:   Cleofas Hudgins Weatherlyis a 66 y.o.malewith history of T2DM, OSA,,subacute inflammatory polyradiculoneuropathy treated withIVIG for lower extremity weakness10/2018. He was eadmitted on 12/29/16 withreports of progressive decline withreports oftightness around abdomen with difficulty breathing,progressive difficulty walking with numbness,difficulty swallowing and  inability to void. Dr. Rory Percy felt thattetraplegialikely due todue to demyelinating myeloneuropathy.MRI  ofbrain/cervical spinerepeated and was negative foracute intracranial abnormality but showedmultilevel moderate stenosis C3/4 and C5/6 with mild cord deformity but no signal changes. Dr. Kathyrn Sheriff consulted and recommended trial of plasma exchange as symptoms favor AIDPand toconsider decompression of cervical spondylosisif no improvement noted.He completed 5 rounds of treatment andwasshowing some improvement.Therapy ongoing and CIR was recommended due to functional deficits.  He was admitted to rehab 11/26/2018for intensive rehab program qa npatient therapies to consist of PT and OT at least three hours five days a week. Past admission physiatrist, therapy team and rehab RN have worked together to provide customized collaborative inpatient rehab. He has been afebrile during his stay and blood pressures have been stable. Diabetes has been monitored with ac/hs checks as well as his continuous dermal monitor. He has had some issues with hypo and hyperglycemia and has been adjusting insulin according to intake as well as activity. He continues to require I/O caths due to ongoing issues with urinary retention. Constipation has resolved with set up of bowel program. He continued to have issues with neuropathic symptoms and gabapentin was titrated up to 600 mg tid without side effects. Mood has been stable and he has shown good motivation with therapy. As therapy progressed, he started developing decrease in activity tolerance with extreme fatigue by the end of the day. His wife expressed concerns about his rate of recovery and neurology felt that it was reasonable try another round of IVIG but also expressed concerns that cervical stenosis may be contributing to his symptoms.  He did not imporve with course of IVIG X 3 rounds therefore NS was consulted and recommended decompressive surgery. He underwent ACDF C3-C5 on 02/01/17 by Dr. Kathyrn Sheriff and has had improvement in symptoms. He is being admitted  back to CIR after an interrupted stay to resume his rehab course.    Hospital Course: TRIGO WINTERBOTTOM was readmitted to [facility] 02/03/2017 to resume his reha course. Blood pressures have been controlled on home regimen and mild hypokalemia was supplemented briefly prior to discharge  Hyponatremia has resolved and ABLA has improved with H/H up to 15.2/43.9.  His mood has been stable and he has been afebrile during his stay. He did develop post op edema under cervical incision with reports of dysphagia and was started on steroids for edema control per neurosurgery input. Incisional edema has almost resolved  and he is to complete taper after discharge. Cervical incision is clean, dry and intact without signs of infection. His diabetes has been monitored with ac/hs CBG checks and blood sugars have been labile with addition of steroids. Patient has been managing his insulin pump and blouses with minimal input. . . Neuropathy has been managed with gabapentin 600 mg tid.   He had urinary retention at admission with volumes up to 1000 cc. He was started on flomax and urecholine as well as bowel program to help with constipation. Constipation has resolved with augmentation of medications and he is currently voiding without difficulty. He is to continue Flomax and urecholine follow up with his primary urology after discharge. Hospital course was significant for encephalopathy due to overdose of narcotics and muscle relaxer's. Somnolence  resolved with narcan and gentle hydration. Robaxin was discontinued and oxycodone was decreased to 5 mg every 6 hours prn. Currently his pain is well controlled and he was weaned to tramadol at discharge.  He has had improvement in BLE strength, improvement in sensation as well  as balance. He has made good progress during his rehab stay and is at modified independent to supervision level. He will continue to receive follow up outpatient PT and OT at [facility] in [place] after  discharge.    Rehab course: During patient's stay in rehab weekly team conferences were held to monitor patient's progress, set goals and discuss barriers to discharge. He has had improvement in activity tolerance, balance, coordination as well as ability to compensate for deficits. He is able to complete ADL tasks at modified independent level. He is modified independent for transfers and is able able to ambulate > 100' with RW and supervision. He is modified independent to ambulate short distance in the room. Family education was completed regarding all aspects of care and mobility.     Disposition: 01-Home or Self Care  Diet: Carb modified/Heart Healthy.   Special Instructions: 1. Wear collar when out of bed. 2. No strenuous activity, lifting items over 5 lbs or overhead activity and no driving.  3. Complete steriod dose pack as instructed. Continue to monitor BS more frequently for the next 5-7 days.    Allergies as of 02/12/2017   No Known Allergies     Medication List    STOP taking these medications   co-enzyme Q-10 30 MG capsule   methocarbamol 500 MG tablet Commonly known as:  ROBAXIN   Misc. Devices Misc   oxyCODONE-acetaminophen 7.5-325 MG tablet Commonly known as:  PERCOCET     TAKE these medications   ACCU-CHEK MULTICLIX LANCET DEV Kit 1 Device by Other route 5 (five) times daily as needed. 5/day dx 250.01   acetaminophen 325 MG tablet Commonly known as:  TYLENOL Take 2 tablets (650 mg total) by mouth every 6 (six) hours as needed for mild pain or headache.   bethanechol 25 MG tablet Commonly known as:  URECHOLINE Take 1 tablet (25 mg total) by mouth 4 (four) times daily.   busPIRone 30 MG tablet Commonly known as:  BUSPAR TAKE ONE-HALF (1/2) TABLET (15 MG) TWICE A DAY What changed:  See the new instructions.   Cranberry 400 MG Tabs Take 2 each by mouth daily.   ENLITE GLUCOSE SENSOR Misc 1 Device by Does not apply route every 3 (three) days.    finasteride 5 MG tablet Commonly known as:  PROSCAR Take 5 mg by mouth daily.   gabapentin 600 MG tablet Commonly known as:  NEURONTIN Take 0.5 tablets (300 mg total) by mouth 3 (three) times daily. What changed:  how much to take   glucagon 1 MG injection Inject 1 mg into the vein once as needed. What changed:  reasons to take this   glucose blood test strip Commonly known as:  BAYER CONTOUR NEXT TEST Use to check blood sugar 5 times per day dx 250.01,   HUMALOG 100 UNIT/ML injection Generic drug:  insulin lispro INJECT 120 UNITS DAILY IN PUMP   lidocaine 5 % Commonly known as:  LIDODERM Place 1 patch onto the skin daily. At 7 am and remove at 7 pm. Available over the counter.   lisinopril-hydrochlorothiazide 10-12.5 MG tablet Commonly known as:  PRINZIDE,ZESTORETIC TAKE 1 TABLET DAILY   methylPREDNISolone 4 MG Tbpk tablet Commonly known as:  MEDROL DOSEPAK Complete taper   multivitamin,tx-minerals tablet Take 1 tablet by mouth daily.   pantoprazole 40 MG tablet Commonly known as:  PROTONIX Take 1 tablet (40 mg total) by mouth at bedtime.   PARADIGM RESERVOIR 3ML Misc 1 Device by Does not apply  route every 3 (three) days.   QUICK-SET INFUSION 43" 9MM Misc 1 Device by Does not apply route every 3 (three) days.   tamsulosin 0.4 MG Caps capsule Commonly known as:  FLOMAX Take 1 capsule (0.4 mg total) by mouth daily after supper.   traMADol 50 MG tablet--Rx # 14 pills  Commonly known as:  ULTRAM Take 1 tablet (50 mg total) by mouth every 12 (twelve) hours as needed for moderate pain.   VITAMIN B-12 CR PO Take 1 each by mouth daily.      Follow-up Information    Jamse Arn, MD Follow up.   Specialty:  Physical Medicine and Rehabilitation Why:  office will call you with follow up appointment Contact information: [address] [place] [address] [phone]        Consuella Lose, MD. Call in 1 day(s).   Specialty:   Neurosurgery Why:  for follow up appointment Contact information: [address] [place] [address] [place] [address] [phone]        Renato Shin, MD Follow up on 02/18/2017.   Specialty:  Endocrinology Why:  @ 3:15 pm (hospital follow up appt) Contact information: [address] [place] [address] [phone]           Signed: Bary Leriche 02/15/2017, 1:18 PM

## 2017-02-11 NOTE — Progress Notes (Signed)
Physical Therapy Note  Patient Details  Name: JAHDIEL KROL MRN: [id number] Date of Birth: 09/20/1950 Today's Date: 02/11/2017    Time: 1000-1055 55 minutes  1:1  Pt c/o pain in mid back, rest and repositioned as needed.  Gait with RW with supervision for long distances > 100', short distance gait in room with mod I.  Mod I for transfers and bed mobility.  Stair negotiation x 8 stairs with bilat handrails with supervision.  Gait with obstacle negotiation with RW with supervision.  Standing balance without UE support with supervision eyes open and eyes closed, pick up object from floor with supervision, standing wt shifts without UE support with supervision.  Min/mod A for tap ups to 6'' step without UE support.  Attempt quadruped, pt able to get into position with min guard, increased stretch on bilat knees increases pain and pt unable to maintain position.  Supine therex bridging, LTR, hooklying march.  Pt states he feels comfortable for d/c home tomorrow.   Garold Sheeler 02/11/2017, 11:00 AM

## 2017-02-11 NOTE — Progress Notes (Signed)
PHYSICAL MEDICINE & REHABILITATION     PROGRESS NOTE  Subjective/Complaints:  Patient seen working with therapies this morning. He states he slept well overnight. He states he is feeling good. He has questions about discharge follow-up appointments. He states his bowels and bladder are working better.  ROS: Denies CP, SOB, vomiting, nausea, diarrhea.  Objective: Vital Signs: Blood pressure (!) 141/79, pulse 71, temperature (!) 97.4 F (36.3 C), temperature source Oral, resp. rate 18, height 6' (1.829 m), weight 98.6 kg (217 lb 6 oz), SpO2 100 %. No results found. No results for input(s): WBC, HGB, HCT, PLT in the last 72 hours. No results for input(s): NA, K, CL, GLUCOSE, BUN, CREATININE, CALCIUM in the last 72 hours.  Invalid input(s): CO CBG (last 3)  Recent Labs    02/10/17 1654 02/10/17 2032 02/11/17 0627  GLUCAP 99 148* 133*    Wt Readings from Last 3 Encounters:  02/03/17 98.6 kg (217 lb 6 oz)  02/01/17 103 kg (227 lb 1.2 oz)  02/01/17 98.6 kg (217 lb 6 oz)    Physical Exam:  BP (!) 141/79 (BP Location: Left Arm)   Pulse 71   Temp (!) 97.4 F (36.3 C) (Oral)   Resp 18   Ht 6' (1.829 m)   Wt 98.6 kg (217 lb 6 oz)   SpO2 100%   BMI 29.48 kg/m  Constitutional: He appears well-developed and well-nourished. No distress.  HENT: Normocephalic and atraumatic.  Eyes: EOM are normal. No discharge.  Neck: Right neck with honey comb dressing and edema, improving Cardiovascular: RRR. No JVD.  Respiratory: Effort normal and breath sounds normal.  GI: He exhibits no distention. BS+  Musculoskeletal: He exhibits no edema or tenderness in extremities.  Neurological: He is alert and oriented.  Motor: B/L UE 4+/5 proximal to distal (unchanged) B/l LE: HF 4-/5, KE, ADF/PF 4+/5 (unchanged) Skin: Skin is warm and dry. No rash noted. He is not diaphoretic. No erythema.  Psychiatric: He has a normal mood and affect. His behavior is normal. Judgment and thought content  normal.    Assessment/Plan: 1. 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 Physiatrist is providing close team supervision and 24 hour management of active medical problems listed below. Physiatrist and rehab team continue to assess barriers to discharge/monitor patient progress toward functional and medical goals.  Function:  Bathing Bathing position Bathing activity did not occur: Refused Position: Production manager parts bathed by patient: Right arm, Right upper leg, Left arm, Left upper leg, Abdomen, Chest, Front perineal area, Buttocks, Left lower leg, Back, Right lower leg    Bathing assist Assist Level: More than reasonable time      Upper Body Dressing/Undressing Upper body dressing   What is the patient wearing?: Pull over shirt/dress     Pull over shirt/dress - Perfomed by patient: Thread/unthread right sleeve, Thread/unthread left sleeve, Put head through opening, Pull shirt over trunk Pull over shirt/dress - Perfomed by helper: Put head through opening, Thread/unthread left sleeve, Thread/unthread right sleeve        Upper body assist Assist Level: No help, No cues   Set up : To obtain clothing/put away  Lower Body Dressing/Undressing Lower body dressing   What is the patient wearing?: Underwear, Pants, Non-skid slipper socks Underwear - Performed by patient: Thread/unthread right underwear leg, Thread/unthread left underwear leg,  Pull underwear up/down   Pants- Performed by patient: Thread/unthread right pants leg, Thread/unthread left pants leg, Pull pants up/down, Fasten/unfasten pants   Non-skid slipper socks- Performed by patient: Don/doff right sock, Don/doff left sock   Socks - Performed by patient: Don/doff right sock, Don/doff left sock                Lower  body assist Assist for lower body dressing: More than reasonable time      Naval architect activity did not occur: No continent bowel/bladder event Toileting steps completed by patient: Adjust clothing prior to toileting, Performs perineal hygiene, Adjust clothing after toileting   Toileting Assistive Devices: Grab bar or rail  Toileting assist Assist level: More than reasonable time   Transfers Chair/bed transfer   Chair/bed transfer method: Ambulatory Chair/bed transfer assist level: Touching or steadying assistance (Pt > 75%) Chair/bed transfer assistive device: Armrests, Medical sales representative     Max distance: 150 ft Assist level: Touching or steadying assistance (Pt > 75%)   Wheelchair   Type: Manual Max wheelchair distance: 150 Assist Level: Supervision or verbal cues  Cognition Comprehension Comprehension assist level: Follows complex conversation/direction with extra time/assistive device  Expression Expression assist level: Expresses complex ideas: With extra time/assistive device  Social Interaction Social Interaction assist level: Interacts appropriately with others - No medications needed.  Problem Solving Problem solving assist level: Solves complex problems: Recognizes & self-corrects  Memory Memory assist level: More than reasonable amount of time    Medical Problem List and Plan: 1. 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 2. DVT Prophylaxis/Anticoagulation: Pharmaceutical: Lovenox 3. Pain Management:    Robaxin DC'd    Gabapentin  600-- 3 times a day    PRN Oxy, decreased frequency   Lidoderm patch added on 12/22 with benefit   Will need to be cautious in administering pain medications due to systemic stimulation on 12/21 and need for Narcan for reversal of  symptoms. 4. Mood: Remains optimistic. LCSW to follow for evaluation and support.  5. Neuropsych: This patient is capable of making decisions on his own behalf. 6. Skin/Wound Care: Routine pressure relief measures. Maintain adequate nutritional and hydration status.  7. Fluids/Electrolytes/Nutrition: Monitor I/Os. Added nutritional supplement to help promote healing and for BS stabilization.  8.T2DM: Hgb A1c- 7.3 (managed by Dr. Loanne Drilling) Monitor BS ac/hs. Has insulin pump--continue lantus with meal coverage and bolus according to intake.     Elevated on 12/26, expect elevation with steroids, improving 9. BPH/Neurogenic bladder:    Continue finasteride and flomax.    Bethanechol started 12/20, increased on 12/23   Continues to improve   Cont to monitor 10 HTN: Monitor BP bid   Relatively controlled on 12/27 11. Anxiety disorder: Managed on Buspar. 12. Hyponatremia:    Sodium 134 and 12/20, stable   Labs ordered for tomorrow   Cont to monitor 13. Neutropenia: Resolved   WNL on 12/20 14. Dehydration: encourage fluid intake.  15. Constipation   Improving, bowel meds decreased on 12/27  LOS (Days) 8 A FACE TO FACE EVALUATION WAS PERFORMED  Eleni Frank Lorie Phenix 02/11/2017 8:35 AM

## 2017-02-11 NOTE — Progress Notes (Signed)
Occupational Therapy Session Note  Patient Details  Name: Kenneth Pace MRN: [id number] Date of Birth: 1950/12/17  Today's Date: 02/11/2017 OT Individual Time: 0160-1093 OT Individual Time Calculation (min): 60 min    Short Term Goals: Week 1:  OT Short Term Goal 1 (Week 1): STG=LTG due to LOS  Skilled Therapeutic Interventions/Progress Updates:    Pt seen for OT session focusing on functional mobility and ADL/IADL re-trainnig. Pt sitting up in recliner upon arrival, ,agreeable to tx session. He declined bathing/dressing this session, desiring to work on other tasks. He ambulated throughout session with RW and distant supervision-mod I level. In ADL kitchen, practiced kitchen mobility and accessibility from RW level. Pr ibtained items from low cabinets, refrigerator and transported items across the room. Education provided regarding set-up of kitchen for increased accessbility and energy conservation during cooking tasks. He then voiced urgent need for BM, ambulated back to room in manner as described above. Completed toileting task with assist only to check for hygiene following large BM. He ambulated out to room and completed grooming tasks from standing position mod I. Seated EOB, completed LE strengthening exercises including hip ADduction with ball squeezes x2 sets of 10 and hip flexion "marches x2 sets of 10 with rest breaks provided btwn sets. Education provided regarding importance of proper form and technique with all exercises and quality vs quantity of repetitions. He transferred back to recliner at end of session and left with all needs in reach.   Therapy Documentation Precautions:  Precautions Precautions: Fall, Cervical Precaution Comments: Cervical precautions; Aspen collar on when ambulating, up/OOB and sitting up in chair. May remove to eat, bathe or walk to BR. Required Braces or Orthoses: Cervical Brace Cervical Brace: Hard collar Restrictions Weight Bearing  Restrictions: No Pain: Pain Assessment Faces Pain Scale: Hurts a little bit Pain Type: Acute pain Pain Location: Back Pain Orientation: Right;Left Pain Descriptors / Indicators: Aching;Sore Pain Intervention(s): Repositioned;Rest  See Function Navigator for Current Functional Status.   Therapy/Group: Individual Therapy  Sebasthian Stailey L 02/11/2017, 7:09 AM

## 2017-02-12 ENCOUNTER — Ambulatory Visit (HOSPITAL_COMMUNITY): Payer: BLUE CROSS/BLUE SHIELD | Admitting: Physical Therapy

## 2017-02-12 DIAGNOSIS — E876 Hypokalemia: Secondary | ICD-10-CM

## 2017-02-12 LAB — CBC WITH DIFFERENTIAL/PLATELET
BASOS ABS: 0 10*3/uL (ref 0.0–0.1)
BASOS PCT: 0 %
EOS ABS: 0.2 10*3/uL (ref 0.0–0.7)
Eosinophils Relative: 2 %
HEMATOCRIT: 43.9 % (ref 39.0–52.0)
Hemoglobin: 15.2 g/dL (ref 13.0–17.0)
Lymphocytes Relative: 25 %
Lymphs Abs: 2.1 10*3/uL (ref 0.7–4.0)
MCH: 30.6 pg (ref 26.0–34.0)
MCHC: 34.6 g/dL (ref 30.0–36.0)
MCV: 88.5 fL (ref 78.0–100.0)
MONO ABS: 0.7 10*3/uL (ref 0.1–1.0)
Monocytes Relative: 8 %
NEUTROS ABS: 5.5 10*3/uL (ref 1.7–7.7)
Neutrophils Relative %: 65 %
PLATELETS: 346 10*3/uL (ref 150–400)
RBC: 4.96 MIL/uL (ref 4.22–5.81)
RDW: 12.5 % (ref 11.5–15.5)
WBC: 8.5 10*3/uL (ref 4.0–10.5)

## 2017-02-12 LAB — BASIC METABOLIC PANEL
ANION GAP: 9 (ref 5–15)
BUN: 19 mg/dL (ref 6–20)
CALCIUM: 9.2 mg/dL (ref 8.9–10.3)
CO2: 29 mmol/L (ref 22–32)
Chloride: 98 mmol/L — ABNORMAL LOW (ref 101–111)
Creatinine, Ser: 0.83 mg/dL (ref 0.61–1.24)
GLUCOSE: 118 mg/dL — AB (ref 65–99)
Potassium: 3.4 mmol/L — ABNORMAL LOW (ref 3.5–5.1)
Sodium: 136 mmol/L (ref 135–145)

## 2017-02-12 LAB — GLUCOSE, CAPILLARY
GLUCOSE-CAPILLARY: 157 mg/dL — AB (ref 65–99)
GLUCOSE-CAPILLARY: 174 mg/dL — AB (ref 65–99)

## 2017-02-12 MED ORDER — LIDOCAINE 5 % EX PTCH: 1.0000 | MEDICATED_PATCH | CUTANEOUS | 0 refills | Status: DC

## 2017-02-12 MED ORDER — POTASSIUM CHLORIDE CRYS ER 20 MEQ PO TBCR: 20.0000 meq | EXTENDED_RELEASE_TABLET | Freq: Every day | ORAL | Status: DC

## 2017-02-12 MED ORDER — TRAMADOL HCL 50 MG PO TABS: 50.0000 mg | ORAL_TABLET | Freq: Two times a day (BID) | ORAL | 0 refills | Status: DC | PRN

## 2017-02-12 MED ORDER — PANTOPRAZOLE SODIUM 40 MG PO TBEC: 40.0000 mg | DELAYED_RELEASE_TABLET | Freq: Every day | ORAL | 0 refills | Status: DC

## 2017-02-12 MED ORDER — METHYLPREDNISOLONE 4 MG PO TBPK: ORAL_TABLET | ORAL | Status: DC

## 2017-02-12 MED ORDER — BETHANECHOL CHLORIDE 25 MG PO TABS: 25.0000 mg | ORAL_TABLET | Freq: Four times a day (QID) | ORAL | 0 refills | Status: DC

## 2017-02-12 MED ORDER — TAMSULOSIN HCL 0.4 MG PO CAPS: 0.4000 mg | ORAL_CAPSULE | Freq: Every day | ORAL | 0 refills | Status: DC

## 2017-02-12 MED ORDER — POTASSIUM CHLORIDE CRYS ER 20 MEQ PO TBCR
40.0000 meq | EXTENDED_RELEASE_TABLET | Freq: Once | ORAL | Status: AC
  Administered 2017-02-12: 40 meq via ORAL
  Filled 2017-02-12: qty 2

## 2017-02-12 MED ORDER — GABAPENTIN 600 MG PO TABS: 300.0000 mg | ORAL_TABLET | Freq: Three times a day (TID) | ORAL | 2 refills | Status: DC

## 2017-02-12 NOTE — Progress Notes (Signed)
[place] PHYSICAL MEDICINE & REHABILITATION     PROGRESS NOTE  Subjective/Complaints:  Pt seen sitting up in his chair this AM.  He slept well overnight.  He is ready for d/c.  He has questions regarding follow up appointments.   ROS: Denies CP, SOB, vomiting, nausea, diarrhea.  Objective: Vital Signs: Blood pressure 128/66, pulse 70, temperature 97.6 F (36.4 C), temperature source Oral, resp. rate 16, height 6' (1.829 m), weight 98.6 kg (217 lb 6 oz), SpO2 99 %. No results found. Recent Labs    02/12/17 0605  WBC 8.5  HGB 15.2  HCT 43.9  PLT 346   Recent Labs    02/12/17 0605  NA 136  K 3.4*  CL 98*  GLUCOSE 118*  BUN 19  CREATININE 0.83  CALCIUM 9.2   CBG (last 3)  Recent Labs    02/11/17 1741 02/11/17 2006 02/12/17 0646  GLUCAP 137* 254* 157*    Wt Readings from Last 3 Encounters:  02/03/17 98.6 kg (217 lb 6 oz)  02/01/17 103 kg (227 lb 1.2 oz)  02/01/17 98.6 kg (217 lb 6 oz)    Physical Exam:  BP 128/66 (BP Location: Left Arm)   Pulse 70   Temp 97.6 F (36.4 C) (Oral)   Resp 16   Ht 6' (1.829 m)   Wt 98.6 kg (217 lb 6 oz)   SpO2 99%   BMI 29.48 kg/m  Constitutional: He appears well-developed and well-nourished. No distress.  HENT: Normocephalic and atraumatic.  Eyes: EOM are normal. No discharge.  Neck: Right neck with honey comb dressing and edema, improving Cardiovascular: RRR. No JVD.  Respiratory: Effort normal and breath sounds normal.  GI: He exhibits no distention. BS+  Musculoskeletal: He exhibits no edema or tenderness in extremities.  Neurological: He is alert and oriented.  Motor: B/L UE 4+/5 proximal to distal (stable) B/l LE: HF 4-/5, KE, ADF/PF 4+/5 (stable) Sensation to light touch improving Skin: Skin is warm and dry. No rash noted. He is not diaphoretic. No erythema.  Psychiatric: He has a normal mood and affect. His behavior is normal. Judgment and thought content normal.    Assessment/Plan: 1. 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 Physiatrist is providing close team supervision and 24 hour management of active medical problems listed below. Physiatrist and rehab team continue to assess barriers to discharge/monitor patient progress toward functional and medical goals.  Function:  Bathing Bathing position Bathing activity did not occur: Refused Position: Production manager parts bathed by patient: Right arm, Right upper leg, Left arm, Left upper leg, Abdomen, Chest, Front perineal area, Buttocks, Left lower leg, Back, Right lower leg    Bathing assist Assist Level: More than reasonable time      Upper Body Dressing/Undressing Upper body dressing   What is the patient wearing?: Pull over shirt/dress     Pull over shirt/dress - Perfomed by patient: Thread/unthread right sleeve, Thread/unthread left sleeve, Put head through opening, Pull shirt over trunk Pull over shirt/dress - Perfomed by helper: Put head through opening, Thread/unthread left sleeve, Thread/unthread right sleeve        Upper body assist Assist Level: No help, No cues   Set up : To obtain clothing/put away  Lower Body Dressing/Undressing Lower body dressing   What is the patient wearing?: Underwear, Pants, Non-skid slipper socks  Underwear - Performed by patient: Thread/unthread right underwear leg, Thread/unthread left underwear leg, Pull underwear up/down   Pants- Performed by patient: Thread/unthread right pants leg, Thread/unthread left pants leg, Pull pants up/down, Fasten/unfasten pants   Non-skid slipper socks- Performed by patient: Don/doff right sock, Don/doff left sock   Socks - Performed by patient: Don/doff right sock, Don/doff left sock                Lower body assist Assist for lower body dressing: More than  reasonable time      Naval architect activity did not occur: No continent bowel/bladder event Toileting steps completed by patient: Adjust clothing prior to toileting, Performs perineal hygiene, Adjust clothing after toileting Toileting steps completed by helper: Adjust clothing prior to toileting, Performs perineal hygiene, Adjust clothing after toileting Toileting Assistive Devices: Grab bar or rail  Toileting assist Assist level: More than reasonable time   Transfers Chair/bed transfer   Chair/bed transfer method: Ambulatory Chair/bed transfer assist level: No Help, no cues, assistive device, takes more than a reasonable amount of time Chair/bed transfer assistive device: Armrests, Medical sales representative     Max distance: 150 ft Assist level: Touching or steadying assistance (Pt > 75%)   Wheelchair   Type: Manual Max wheelchair distance: 150 Assist Level: Supervision or verbal cues  Cognition Comprehension Comprehension assist level: Follows complex conversation/direction with extra time/assistive device  Expression Expression assist level: Expresses complex ideas: With extra time/assistive device  Social Interaction Social Interaction assist level: Interacts appropriately with others - No medications needed.  Problem Solving Problem solving assist level: Solves complex problems: Recognizes & self-corrects  Memory Memory assist level: More than reasonable amount of time    Medical Problem List and Plan: 1. 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 2. DVT Prophylaxis/Anticoagulation: Pharmaceutical: Lovenox 3. Pain Management:    Robaxin DC'd    Gabapentin  600-- 3 times a day    PRN Oxy, decreased frequency    Lidoderm patch added on 12/22 with benefit   Need to be cautious in administering pain medications due to systemic stimulation on 12/21 and need for Narcan for reversal of symptoms. 4. Mood: Remains optimistic. LCSW to follow for evaluation and support.  5. Neuropsych: This patient is capable of making decisions on his own behalf. 6. Skin/Wound Care: Routine pressure relief measures. Maintain adequate nutritional and hydration status.  7. Fluids/Electrolytes/Nutrition: Monitor I/Os. Added nutritional supplement to help promote healing and for BS stabilization.  8.T2DM: Hgb A1c- 7.3 (managed by Dr. Loanne Drilling) Monitor BS ac/hs. Has insulin pump--continue lantus with meal coverage and bolus according to intake.     Elevated on 12/26, expect elevation with steroids, stabalizing 9. BPH/Neurogenic bladder:    Continue finasteride and flomax.    Bethanechol started 12/20, increased on 12/23   Continues to improve   Cont to monitor 10 HTN: Monitor BP bid   Relatively controlled on 12/28 11. Anxiety disorder: Managed on Buspar. 12. Hyponatremia: Resolved   Sodium 136 on 12/28   Cont to monitor 13. Neutropenia: Resolved   WNL on 12/20 14. Dehydration: encourage fluid intake.  15. Constipation   Improving, bowel meds decreased on 12/27 16. Hypokalemia   K+ 3.4 on 12/28    Supplemented  x1  LOS (Days) 9 A FACE TO FACE EVALUATION WAS PERFORMED  Ankit Lorie Phenix 02/12/2017 8:55 AM

## 2017-02-12 NOTE — Progress Notes (Signed)
Patient discharged home.  Left floor via wheelchair, escorted by nursing staff and spouse.  Patient and spouse verbalized understanding of discharge instructions as given by Algis Liming, PA.  All patient belongings sent with patient, including DME and prescriptions.  Appears to be in no immediate distress at this time.  Brita Romp, RN

## 2017-02-12 NOTE — Progress Notes (Signed)
Physical Therapy Session Note  Patient Details  Name: Kenneth Pace MRN: [id number] Date of Birth: Apr 24, 1950  Today's Date: 02/12/2017 PT Individual Time: 1330-1400     Short Term Goals: Week 1:  PT Short Term Goal 1 (Week 1): = LTGs due to ELOS  Skilled Therapeutic Interventions/Progress Updates:    Pt received sitting in WC and agreeable to PT. PT treatment focused on family educated for stair management and WC parts management. Pt ascend/descend 12step with 2 rails x 12 steps with one rail on the L to simulate home set up. Wife instructed in how to provide min assist for stair management, but insisted on provided supervision assist, reporting that she had helped him go up/down the steps at home while he couldn't walk. WC parts management education for how to store [facility] and improve safety with transfers. Patient returned to room and left sitting in [facility] with call bell in reach and all needs met.     Therapy Documentation Precautions:  Precautions Precautions: Fall, Cervical Precaution Comments: Cervical precautions; Aspen collar on when ambulating, up/OOB and sitting up in chair. May remove to eat, bathe or walk to BR. Required Braces or Orthoses: Cervical Brace Cervical Brace: Hard collar Restrictions Weight Bearing Restrictions: No Vital Signs: Therapy Vitals Pulse Rate: 87 Resp: 18 BP: (!) 119/59 Patient Position (if appropriate): Sitting Oxygen Therapy SpO2: 98 % O2 Device: Not Delivered Pain:   0/10 See Function Navigator for Current Functional Status.   Therapy/Group: Individual Therapy  Lorie Phenix 02/12/2017, 1:47 PM

## 2017-02-12 NOTE — Discharge Instructions (Signed)
Inpatient Rehab Discharge Instructions  Kenneth Pace Discharge date and time:    Activities/Precautions/ Functional Status: Activity: no lifting, driving, or strenuous exercise for till cleared by MD Diet: diabetic diet Wound Care: keep wound clean and dry   Functional status:  ___ No restrictions     ___ Walk up steps independently ___ 24/7 supervision/assistance   ___ Walk up steps with assistance ___ Intermittent supervision/assistance  ___ Bathe/dress independently ___ Walk with walker     ___ Bathe/dress with assistance ___ Walk Independently    ___ Shower independently ___ Walk with assistance    ___ Shower with assistance ___ No alcohol     ___ Return to work/school ________    COMMUNITY REFERRALS UPON DISCHARGE:    Outpatient: PT     OT                   Agency:  [facility]     Phone:  [phone]                Appointment Date/Time:  The center will contact you directly with your appointment information  Medical Equipment/Items Ordered:  Wheelchair, cushion                                                      Agency/Supplier:  [facility]  @ [phone]      Special Instructions:    My questions have been answered and I understand these instructions. I will adhere to these goals and the provided educational materials after my discharge from the hospital.  Patient/Caregiver Signature _______________________________ Date __________  Clinician Signature _______________________________________ Date __________  Please bring this form and your medication list with you to all your follow-up doctor's appointments.

## 2017-02-13 DIAGNOSIS — G4733 Obstructive sleep apnea (adult) (pediatric): Secondary | ICD-10-CM | POA: Diagnosis not present

## 2017-02-16 NOTE — Progress Notes (Signed)
Social Work  Discharge Note  The overall goal for the admission was met for:   Discharge location: Yes - home with wife  Length of Stay: Yes  - 9 days (return to CIR)  Discharge activity level: Yes - supervision to modified independent  Home/community participation: Yes  Services provided included: MD, RD, PT, OT, RN, TR, Pharmacy and SW  Financial Services: Private Insurance: [employer]  Follow-up services arranged: Outpatient: PT, OT via [facility]), DME: [phone] lightweight w/c via [place] and Patient/Family has no preference for HH/DME agencies  Comments (or additional information):  Patient/Family verbalized understanding of follow-up arrangements: Yes  Individual responsible for coordination of the follow-up plan: pt  Confirmed correct DME delivered: Juniel Groene 02/16/2017    Makhai Fulco

## 2017-02-18 ENCOUNTER — Ambulatory Visit: Payer: BLUE CROSS/BLUE SHIELD | Admitting: Endocrinology

## 2017-02-18 DIAGNOSIS — R2689 Other abnormalities of gait and mobility: Secondary | ICD-10-CM | POA: Diagnosis not present

## 2017-02-18 DIAGNOSIS — Z9181 History of falling: Secondary | ICD-10-CM | POA: Diagnosis not present

## 2017-02-18 DIAGNOSIS — R29898 Other symptoms and signs involving the musculoskeletal system: Secondary | ICD-10-CM | POA: Diagnosis not present

## 2017-02-18 DIAGNOSIS — M542 Cervicalgia: Secondary | ICD-10-CM | POA: Diagnosis not present

## 2017-02-19 DIAGNOSIS — R2689 Other abnormalities of gait and mobility: Secondary | ICD-10-CM | POA: Diagnosis not present

## 2017-02-19 DIAGNOSIS — M542 Cervicalgia: Secondary | ICD-10-CM | POA: Diagnosis not present

## 2017-02-19 DIAGNOSIS — R29898 Other symptoms and signs involving the musculoskeletal system: Secondary | ICD-10-CM | POA: Diagnosis not present

## 2017-02-19 DIAGNOSIS — Z9181 History of falling: Secondary | ICD-10-CM | POA: Diagnosis not present

## 2017-02-22 DIAGNOSIS — M542 Cervicalgia: Secondary | ICD-10-CM | POA: Diagnosis not present

## 2017-02-22 DIAGNOSIS — Z9181 History of falling: Secondary | ICD-10-CM | POA: Diagnosis not present

## 2017-02-22 DIAGNOSIS — R29898 Other symptoms and signs involving the musculoskeletal system: Secondary | ICD-10-CM | POA: Diagnosis not present

## 2017-02-22 DIAGNOSIS — R2689 Other abnormalities of gait and mobility: Secondary | ICD-10-CM | POA: Diagnosis not present

## 2017-02-24 ENCOUNTER — Telehealth: Payer: Self-pay

## 2017-02-24 DIAGNOSIS — Z9181 History of falling: Secondary | ICD-10-CM | POA: Diagnosis not present

## 2017-02-24 DIAGNOSIS — R29898 Other symptoms and signs involving the musculoskeletal system: Secondary | ICD-10-CM | POA: Diagnosis not present

## 2017-02-24 DIAGNOSIS — R2689 Other abnormalities of gait and mobility: Secondary | ICD-10-CM | POA: Diagnosis not present

## 2017-02-24 DIAGNOSIS — M542 Cervicalgia: Secondary | ICD-10-CM | POA: Diagnosis not present

## 2017-02-24 NOTE — Telephone Encounter (Signed)
This patient was just released from hospital on 02/12/2017, he is requesting paperwork for the [employer].  He has a hospital follow up on 03/11/2017, not sure how to go about this as he has never been seen in this clinic as of today.  Please advise

## 2017-02-24 NOTE — Telephone Encounter (Signed)
Consulted with lisa, she stated we cannot do anything with paperwork until we physically see patient on his next appointment. Called patient back and informed him of information.

## 2017-02-25 DIAGNOSIS — M542 Cervicalgia: Secondary | ICD-10-CM | POA: Diagnosis not present

## 2017-02-26 ENCOUNTER — Other Ambulatory Visit: Payer: Self-pay

## 2017-02-26 ENCOUNTER — Encounter: Payer: Self-pay | Admitting: Physical Medicine & Rehabilitation

## 2017-02-26 ENCOUNTER — Encounter: Payer: BLUE CROSS/BLUE SHIELD | Admitting: Physical Medicine & Rehabilitation

## 2017-02-26 ENCOUNTER — Encounter
Payer: BLUE CROSS/BLUE SHIELD | Attending: Physical Medicine & Rehabilitation | Admitting: Physical Medicine & Rehabilitation

## 2017-02-26 VITALS — BP 131/71 | HR 69

## 2017-02-26 DIAGNOSIS — G610 Guillain-Barre syndrome: Secondary | ICD-10-CM | POA: Insufficient documentation

## 2017-02-26 DIAGNOSIS — I10 Essential (primary) hypertension: Secondary | ICD-10-CM | POA: Diagnosis not present

## 2017-02-26 DIAGNOSIS — M4802 Spinal stenosis, cervical region: Secondary | ICD-10-CM | POA: Diagnosis not present

## 2017-02-26 DIAGNOSIS — M4712 Other spondylosis with myelopathy, cervical region: Secondary | ICD-10-CM | POA: Diagnosis not present

## 2017-02-26 DIAGNOSIS — R269 Unspecified abnormalities of gait and mobility: Secondary | ICD-10-CM | POA: Diagnosis not present

## 2017-02-26 DIAGNOSIS — Z9641 Presence of insulin pump (external) (internal): Secondary | ICD-10-CM | POA: Insufficient documentation

## 2017-02-26 DIAGNOSIS — G8918 Other acute postprocedural pain: Secondary | ICD-10-CM | POA: Diagnosis not present

## 2017-02-26 DIAGNOSIS — Z8249 Family history of ischemic heart disease and other diseases of the circulatory system: Secondary | ICD-10-CM | POA: Insufficient documentation

## 2017-02-26 DIAGNOSIS — E785 Hyperlipidemia, unspecified: Secondary | ICD-10-CM | POA: Insufficient documentation

## 2017-02-26 DIAGNOSIS — R339 Retention of urine, unspecified: Secondary | ICD-10-CM

## 2017-02-26 DIAGNOSIS — E119 Type 2 diabetes mellitus without complications: Secondary | ICD-10-CM

## 2017-02-26 DIAGNOSIS — N319 Neuromuscular dysfunction of bladder, unspecified: Secondary | ICD-10-CM | POA: Insufficient documentation

## 2017-02-26 DIAGNOSIS — Z820 Family history of epilepsy and other diseases of the nervous system: Secondary | ICD-10-CM | POA: Diagnosis not present

## 2017-02-26 DIAGNOSIS — N529 Male erectile dysfunction, unspecified: Secondary | ICD-10-CM | POA: Insufficient documentation

## 2017-02-26 DIAGNOSIS — Z981 Arthrodesis status: Secondary | ICD-10-CM | POA: Diagnosis not present

## 2017-02-26 DIAGNOSIS — G959 Disease of spinal cord, unspecified: Secondary | ICD-10-CM | POA: Diagnosis not present

## 2017-02-26 DIAGNOSIS — F411 Generalized anxiety disorder: Secondary | ICD-10-CM

## 2017-02-26 DIAGNOSIS — Z9889 Other specified postprocedural states: Secondary | ICD-10-CM | POA: Diagnosis not present

## 2017-02-26 DIAGNOSIS — R531 Weakness: Secondary | ICD-10-CM | POA: Insufficient documentation

## 2017-02-26 DIAGNOSIS — Z833 Family history of diabetes mellitus: Secondary | ICD-10-CM | POA: Diagnosis not present

## 2017-02-26 DIAGNOSIS — M792 Neuralgia and neuritis, unspecified: Secondary | ICD-10-CM | POA: Diagnosis not present

## 2017-02-26 DIAGNOSIS — E109 Type 1 diabetes mellitus without complications: Secondary | ICD-10-CM | POA: Insufficient documentation

## 2017-02-26 DIAGNOSIS — Z8261 Family history of arthritis: Secondary | ICD-10-CM | POA: Insufficient documentation

## 2017-02-26 DIAGNOSIS — G4733 Obstructive sleep apnea (adult) (pediatric): Secondary | ICD-10-CM | POA: Insufficient documentation

## 2017-02-26 DIAGNOSIS — G378 Other specified demyelinating diseases of central nervous system: Secondary | ICD-10-CM | POA: Diagnosis not present

## 2017-02-26 DIAGNOSIS — N400 Enlarged prostate without lower urinary tract symptoms: Secondary | ICD-10-CM

## 2017-02-26 MED ORDER — TRAMADOL HCL 50 MG PO TABS: 50.0000 mg | ORAL_TABLET | Freq: Four times a day (QID) | ORAL | 1 refills | Status: DC | PRN

## 2017-02-26 MED ORDER — LIDOCAINE 5 % EX PTCH: 1.0000 | MEDICATED_PATCH | CUTANEOUS | 1 refills | Status: DC

## 2017-02-26 NOTE — Progress Notes (Signed)
Subjective:    Patient ID: Kenneth Pace, male    DOB: 04-28-1950, 67 y.o.   MRN: [id number]  HPI 67 y.o. 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   Admit date: 02/03/2017 Discharge date: 02/12/2017  Wife provides majority of history. At discharge, he was instructed to wear his collar, which he has been compliant with.  He saw Neurosurg and follows up next month.  He stopped Gabapentin due to edema. He sees Endo on Monday. He notes pain, mainly at night, dull pain. CBGs have been relatively controlled.  Bladder functioning is improving. He stopped taking Bethanechol due to bowel incontinence. BP is relatively controlled.   Therapies: 2/week Mobility: Walker at all times DME: Bedside commode, Shower chair  Pain Inventory Average Pain 0 Pain Right Now 0 My pain is no pain  In the last 24 hours, has pain interfered with the following? General activity 0 Relation with others 0 Enjoyment of life 0 What TIME of day is your pain at its worst? no pain Sleep (in general) Fair  Pain is worse with: no pain Pain improves with: no pain Relief from Meds: 0  Mobility walk with assistance use a cane use a walker ability to climb steps?  yes do you drive?  no  Function employed # of hrs/week 40 I need assistance with the following:  dressing, meal prep, household duties and shopping  Neuro/Psych No problems in this area  Prior Studies Any changes since last visit?  no  Physicians involved in your care Any changes since last visit?  no   Family History  Problem Relation Age of Onset  . Dementia Mother   . Diabetes Mellitus I Mother   . Hypertension Father        71  . Healthy Sister   . Rheum arthritis Brother   . Cancer Neg Hx    Social History   Socioeconomic History  . Marital  status: Married    Spouse name: None  . Number of children: None  . Years of education: None  . Highest education level: None  Social Needs  . Financial resource strain: None  . Food insecurity - worry: None  . Food insecurity - inability: None  . Transportation needs - medical: None  . Transportation needs - non-medical: None  Occupational History  . Occupation: Lobbyist: [place]    Comment: [employer]  Tobacco Use  . Smoking status: Never Smoker  . Smokeless tobacco: Never Used  Substance and Sexual Activity  . Alcohol use: No  . Drug use: No  . Sexual activity: None  Other Topics Concern  . None  Social History Narrative   He works for [employer] [place]   He lives at home with wife.     Highest level of education:  BS, business admin   Past Surgical History:  Procedure Laterality Date  . ANTERIOR CERVICAL DECOMP/DISCECTOMY FUSION N/A 02/01/2017   Procedure: ANTERIOR CERVICAL DECOMPRESSION/DISCECTOMY FUSION CERVICAL THREE-FOUR , CERVICAL FOUR-FIVE  CERVICAL FIVE-SIX;  Surgeon: Consuella Lose, MD;  Location: [place];  Service: Neurosurgery;  Laterality: N/A;  . KNEE SURGERY     2 Lknee arthroscopy, 3 R knee arthroscpoy  . KNEE SURGERY Right 11/12/2016  . TONSILLECTOMY     Past Medical History:  Diagnosis Date  .  ADENOIDECTOMY, HX OF 02/16/2007  . Anxiety    pt. states he does not have anxiety.  Marland Kitchen BENIGN PROSTATIC HYPERTROPHY, WITH OBSTRUCTION 02/03/2010  . DIABETES MELLITUS, TYPE I, UNCONTROLLED 12/29/2006  . ERECTILE DYSFUNCTION 02/16/2007  . HYPERLIPIDEMIA 02/16/2007  . HYPERTENSION 02/16/2007  . TESTICULAR MASS, LEFT 02/16/2007   BP 131/71   Pulse 69   SpO2 98%   Opioid Risk Score:   Fall Risk Score:  `1  Depression screen PHQ 2/9  Depression screen [facility] 2/9 02/26/2017 02/26/2017  Decreased Interest 0 0  Down, Depressed, Hopeless 0 0  PHQ - 2 Score 0 0  Altered sleeping 1 -  Tired, decreased energy 0 -    Change in appetite 0 -  Feeling bad or failure about yourself  0 -  Trouble concentrating 0 -  Moving slowly or fidgety/restless 1 -  Suicidal thoughts 0 -  PHQ-9 Score 2 -  Difficult doing work/chores Not difficult at all -   Review of Systems  HENT: Negative.   Eyes: Negative.   Respiratory: Negative.   Cardiovascular: Negative.   Gastrointestinal: Negative.   Endocrine: Negative.   Genitourinary: Negative.   Musculoskeletal: Positive for arthralgias, gait problem, myalgias and neck pain.  Skin: Negative.   Allergic/Immunologic: Negative.   Neurological: Positive for weakness and numbness.  Hematological: Negative.   Psychiatric/Behavioral: Negative.   All other systems reviewed and are negative.     Objective:   Physical Exam Constitutional: He appears well-developed and well-nourished. No distress.  HENT: Normocephalic and atraumatic.  Eyes: EOM are normal. No discharge.  Neck: Right neck with honey comb dressing and edema, improving Cardiovascular: RRR. No JVD.  Respiratory: Effort normal and breath sounds normal.  GI: He exhibits no distention. BS+  Musculoskeletal: He exhibits no edema or tenderness in extremities.  Neurological: He is alert and oriented.  Motor: B/L UE 4+-5/5 proximal to distal B/l LE: HF 4+/5, KE, ADF/PF 4+/5 Sensation to light touch improving Skin: Skin is warm and dry. No rash noted. He is not diaphoretic. No erythema.  Psychiatric: He has a normal mood and affect. His behavior is normal. Judgment and thought content normal.     Assessment & Plan:  67 y.o. 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   1.  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  Cont therapies  Cont follow  up Neurosurg   2.  Pain Management:   Gabapentin d/ced due to edema  Lidoderm patch added on 12/22 with benefit  Will order Lidoderm patch  Will order Tramadol 50 qhs  3. T2DM:   Cont insulin pump  4. BPH/Neurogenic bladder:   Follow up with Urology  Cont meds  5. Gait abnormality  Cont therapies  Cont walker for safety

## 2017-03-01 ENCOUNTER — Encounter: Payer: Self-pay | Admitting: Endocrinology

## 2017-03-01 ENCOUNTER — Ambulatory Visit: Payer: BLUE CROSS/BLUE SHIELD | Admitting: Endocrinology

## 2017-03-01 VITALS — BP 122/82 | HR 78 | Wt 217.0 lb

## 2017-03-01 DIAGNOSIS — M542 Cervicalgia: Secondary | ICD-10-CM | POA: Diagnosis not present

## 2017-03-01 DIAGNOSIS — R2689 Other abnormalities of gait and mobility: Secondary | ICD-10-CM | POA: Diagnosis not present

## 2017-03-01 DIAGNOSIS — R609 Edema, unspecified: Secondary | ICD-10-CM

## 2017-03-01 DIAGNOSIS — E119 Type 2 diabetes mellitus without complications: Secondary | ICD-10-CM

## 2017-03-01 DIAGNOSIS — Z9181 History of falling: Secondary | ICD-10-CM | POA: Diagnosis not present

## 2017-03-01 DIAGNOSIS — R29898 Other symptoms and signs involving the musculoskeletal system: Secondary | ICD-10-CM | POA: Diagnosis not present

## 2017-03-01 LAB — LIPID PANEL
CHOLESTEROL: 94 mg/dL (ref 0–200)
HDL: 33.5 mg/dL — ABNORMAL LOW (ref >39.00)
LDL Cholesterol: 37 mg/dL (ref 0–99)
NonHDL: 60.16
TRIGLYCERIDES: 115 mg/dL (ref 0.0–149.0)
Total CHOL/HDL Ratio: 3
VLDL: 23 mg/dL (ref 0.0–40.0)

## 2017-03-01 LAB — BASIC METABOLIC PANEL
BUN: 19 mg/dL (ref 6–23)
CO2: 29 meq/L (ref 19–32)
Calcium: 9.3 mg/dL (ref 8.4–10.5)
Chloride: 101 mEq/L (ref 96–112)
Creatinine, Ser: 0.78 mg/dL (ref 0.40–1.50)
GFR: 106.31 mL/min (ref >60.00)
GLUCOSE: 158 mg/dL — AB (ref 70–99)
POTASSIUM: 3.5 meq/L (ref 3.5–5.1)
SODIUM: 138 meq/L (ref 135–145)

## 2017-03-01 LAB — BRAIN NATRIURETIC PEPTIDE: PRO B NATRI PEPTIDE: 13 pg/mL (ref 0.0–100.0)

## 2017-03-01 LAB — POCT GLYCOSYLATED HEMOGLOBIN (HGB A1C): Hemoglobin A1C: 7.7

## 2017-03-01 MED ORDER — GABAPENTIN 600 MG PO TABS: ORAL_TABLET | ORAL | 11 refills | Status: DC

## 2017-03-01 NOTE — Progress Notes (Signed)
Subjective:    Patient ID: Kenneth Pace, male    DOB: [birth year], 67 y.o.   MRN: [id number]  HPI Pt returns for f/u of diabetes mellitus:  DM type: 1 Dx'ed: 3818 Complications: retinopathy.   Therapy: insulin since dx DKA: only at dx Severe hypoglycemia: most recently in 2014.  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  continuous glucose monitor ID is [email or web address] and password is [email or web address] Interval history:   He has been prescribed these pump settings:  basal rate of 3.0 units/hr, 24 hrs per day.   mealtime bolus of 1 unit with each meal.  continue correction bolus (which some people call "sensitivity," or "insulin sensitivity ratio," or just "isr") of 1 unit for each 50 by which glucose exceeds 100.  suspends the pump for 1-2 hrs, with activity.  If not, eat a light snack with it.   He says he seldom has hypoglycemia, and these episodes are mild.   Wife says bethanechol causes bowel incont, and urinary flow is much better.  He stopped it 5 days ago.   Neck pain is much better.  He has reduced gabapentin to 150 mg qid Past Medical History:  Diagnosis Date  . ADENOIDECTOMY, HX OF 02/16/2007  . Anxiety    pt. states he does not have anxiety.  Marland Kitchen BENIGN PROSTATIC HYPERTROPHY, WITH OBSTRUCTION 02/03/2010  . DIABETES MELLITUS, TYPE I, UNCONTROLLED 12/29/2006  . ERECTILE DYSFUNCTION 02/16/2007  . HYPERLIPIDEMIA 02/16/2007  . HYPERTENSION 02/16/2007  . TESTICULAR MASS, LEFT 02/16/2007    Past Surgical History:  Procedure Laterality Date  . ANTERIOR CERVICAL DECOMP/DISCECTOMY FUSION N/A 02/01/2017   Procedure: ANTERIOR CERVICAL DECOMPRESSION/DISCECTOMY FUSION CERVICAL THREE-FOUR , CERVICAL FOUR-FIVE  CERVICAL FIVE-SIX;  Surgeon: Consuella Lose, MD;  Location: [place];  Service: Neurosurgery;  Laterality: N/A;  . KNEE SURGERY     2 Lknee arthroscopy, 3 R knee arthroscpoy  .  KNEE SURGERY Right 11/12/2016  . TONSILLECTOMY      Social History   Socioeconomic History  . Marital status: Married    Spouse name: Not on file  . Number of children: Not on file  . Years of education: Not on file  . Highest education level: Not on file  Social Needs  . Financial resource strain: Not on file  . Food insecurity - worry: Not on file  . Food insecurity - inability: Not on file  . Transportation needs - medical: Not on file  . Transportation needs - non-medical: Not on file  Occupational History  . 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 Smoking status: Never Smoker  . Smokeless tobacco: Never Used  Substance and Sexual Activity  . Alcohol use: No  . Drug use: No  . Sexual activity: Not on file  Other Topics Concern  . Not on file  Social History Narrative   He works for [employer]   He lives at home with wife.     Highest level of education:  BS, business admin    Current Outpatient Medications on File Prior to Visit  Medication Sig Dispense Refill  . acetaminophen (TYLENOL) 325 MG tablet Take 2 tablets (650 mg total) by mouth every 6 (six) hours as needed for mild pain or headache.    . busPIRone (BUSPAR) 30 MG tablet TAKE ONE-HALF (1/2) TABLET (15 MG)  TWICE A DAY (Patient taking differently: 30MG BY MOUTH AT BEDTIME) 180 tablet 1  . Continuous Glucose Monitor Sup (ENLITE GLUCOSE SENSOR) MISC 1 Device by Does not apply route every 3 (three) days. 30 each 3  . Cranberry 400 MG TABS Take 2 each by mouth daily.      . Cyanocobalamin (VITAMIN B-12 CR PO) Take 1 each by mouth daily.      . finasteride (PROSCAR) 5 MG tablet Take 5 mg by mouth daily.     Marland Kitchen glucagon 1 MG injection Inject 1 mg into the vein once as needed. (Patient taking differently: Inject 1 mg once as needed into the vein (severe hypoglycemia). ) 1 each 12  . glucose blood (BAYER CONTOUR NEXT TEST) test strip  Use to check blood sugar 5 times per day dx 250.01, 500 each 3  . HUMALOG 100 UNIT/ML injection INJECT 120 UNITS DAILY IN PUMP 100 mL 11  . Insulin Infusion Pump Supplies (PARADIGM RESERVOIR 3ML) MISC 1 Device by Does not apply route every 3 (three) days. 30 each 3  . Insulin Infusion Pump Supplies (QUICK-SET INFUSION 43" 9MM) MISC 1 Device by Does not apply route every 3 (three) days. 30 each 3  . Lancets Misc. (ACCU-CHEK MULTICLIX LANCET DEV) KIT 1 Device by Other route 5 (five) times daily as needed. 5/day dx 250.01 450 each 3  . lidocaine (LIDODERM) 5 % Place 1 patch onto the skin daily. At 7 am and remove at 7 pm. Available over the counter. 30 patch 1  . lisinopril-hydrochlorothiazide (PRINZIDE,ZESTORETIC) 10-12.5 MG tablet TAKE 1 TABLET DAILY 90 tablet 1  . Multiple Vitamins-Minerals (MULTIVITAMIN,TX-MINERALS) tablet Take 1 tablet by mouth daily.      . tamsulosin (FLOMAX) 0.4 MG CAPS capsule Take 1 capsule (0.4 mg total) by mouth daily after supper. 30 capsule 0  . traMADol (ULTRAM) 50 MG tablet Take 1 tablet (50 mg total) by mouth every 6 (six) hours as needed. 30 tablet 1   No current facility-administered medications on file prior to visit.     No Known Allergies  Family History  Problem Relation Age of Onset  . Dementia Mother   . Diabetes Mellitus I Mother   . Hypertension Father        30  . Healthy Sister   . Rheum arthritis Brother   . Cancer Neg Hx     BP 122/82 (BP Location: Left Arm, Patient Position: Sitting, Cuff Size: Normal)   Pulse 78   Wt 217 lb (98.4 kg)   SpO2 99%   BMI 29.43 kg/m   Review of Systems He has lost 23 lbs since last ov here.  He denies sob.      Objective:   Physical Exam VITAL SIGNS:  See vs page GENERAL: no distress Pulses: foot pulses are intact bilaterally.   MSK: no deformity of the feet or ankles.  CV: 2+ edema of the legs. Skin:  no ulcer on the feet or ankles.  normal color and temp on the feet and ankles Neuro: sensation  is intact to touch on the feet and ankles, but decreased from normal.    A1c=7.7%     Assessment & Plan:  Edema, worse.  Check BNP Type 1 DM: The pattern of his cbg's indicates he needs some adjustment in his therapy Neck pain: improved Urinary retention: improved  Patient Instructions  Please continue these pump settings: basal rate of 3.0 units/hr mealtime bolus of 1 unit with each meal.  correction bolus (which some people call "sensitivity," or "insulin sensitivity ratio," or just "isr") of 1 unit for each 50 by which your glucose exceeds 100.  suspend the pump for 1-2 hrs, with activity.  If not, eat a light snack with it.   On this type of insulin schedule, you should eat meals on a regular schedule.  If a meal is missed or significantly delayed, your blood sugar could go low.  You can stay off the urecholine.   You can continue the gabapentin 150 mg twice a day.  Please come back for a follow-up appointment in 2 months.

## 2017-03-01 NOTE — Patient Instructions (Addendum)
Please continue these pump settings: basal rate of 3.0 units/hr mealtime bolus of 1 unit with each meal.   correction bolus (which some people call "sensitivity," or "insulin sensitivity ratio," or just "isr") of 1 unit for each 50 by which your glucose exceeds 100.  suspend the pump for 1-2 hrs, with activity.  If not, eat a light snack with it.   On this type of insulin schedule, you should eat meals on a regular schedule.  If a meal is missed or significantly delayed, your blood sugar could go low.  You can stay off the urecholine.   You can continue the gabapentin 150 mg twice a day.  Please come back for a follow-up appointment in 2 months.

## 2017-03-02 ENCOUNTER — Other Ambulatory Visit: Payer: Self-pay

## 2017-03-03 DIAGNOSIS — R29898 Other symptoms and signs involving the musculoskeletal system: Secondary | ICD-10-CM | POA: Diagnosis not present

## 2017-03-03 DIAGNOSIS — Z9181 History of falling: Secondary | ICD-10-CM | POA: Diagnosis not present

## 2017-03-03 DIAGNOSIS — R2689 Other abnormalities of gait and mobility: Secondary | ICD-10-CM | POA: Diagnosis not present

## 2017-03-03 DIAGNOSIS — M542 Cervicalgia: Secondary | ICD-10-CM | POA: Diagnosis not present

## 2017-03-05 ENCOUNTER — Telehealth: Payer: Self-pay | Admitting: Physical Medicine & Rehabilitation

## 2017-03-05 NOTE — Telephone Encounter (Signed)
Patient left voicemail [phone] stating needs to be completed for [place] - I cannot locate on ap's desk - left message to return call -who received information or is it still to be faxed.

## 2017-03-08 DIAGNOSIS — Z9181 History of falling: Secondary | ICD-10-CM | POA: Diagnosis not present

## 2017-03-08 DIAGNOSIS — R2689 Other abnormalities of gait and mobility: Secondary | ICD-10-CM | POA: Diagnosis not present

## 2017-03-08 DIAGNOSIS — R29898 Other symptoms and signs involving the musculoskeletal system: Secondary | ICD-10-CM | POA: Diagnosis not present

## 2017-03-08 DIAGNOSIS — M542 Cervicalgia: Secondary | ICD-10-CM | POA: Diagnosis not present

## 2017-03-09 ENCOUNTER — Other Ambulatory Visit: Payer: Self-pay

## 2017-03-09 DIAGNOSIS — G4733 Obstructive sleep apnea (adult) (pediatric): Secondary | ICD-10-CM | POA: Diagnosis not present

## 2017-03-10 ENCOUNTER — Telehealth: Payer: Self-pay | Admitting: Neurology

## 2017-03-10 DIAGNOSIS — Z9181 History of falling: Secondary | ICD-10-CM | POA: Diagnosis not present

## 2017-03-10 DIAGNOSIS — R2689 Other abnormalities of gait and mobility: Secondary | ICD-10-CM | POA: Diagnosis not present

## 2017-03-10 DIAGNOSIS — M542 Cervicalgia: Secondary | ICD-10-CM | POA: Diagnosis not present

## 2017-03-10 DIAGNOSIS — R29898 Other symptoms and signs involving the musculoskeletal system: Secondary | ICD-10-CM | POA: Diagnosis not present

## 2017-03-10 NOTE — Telephone Encounter (Signed)
Patient's wife called and needed to make a follow up for her husband. He had cervical Surgery on 12/17 and a 3 Level Fusion. He will be out of work through February. He also had 2 rounds of IV IG while in patient. Dr. Posey Pronto is booked out until June. Should he be placed on a wait list? Please Advise. Thanks

## 2017-03-11 ENCOUNTER — Encounter: Payer: BLUE CROSS/BLUE SHIELD | Admitting: Physical Medicine & Rehabilitation

## 2017-03-11 NOTE — Telephone Encounter (Signed)
Left message for Mrs. Madara to call me back.  I have patient scheduled for 04-14-17 at 7:45 but I can put him on our waiting list.

## 2017-03-11 NOTE — Telephone Encounter (Signed)
Patient's wife called back and would like to keep the 04-14-17 appointment.

## 2017-03-12 DIAGNOSIS — M542 Cervicalgia: Secondary | ICD-10-CM | POA: Diagnosis not present

## 2017-03-14 DIAGNOSIS — G378 Other specified demyelinating diseases of central nervous system: Secondary | ICD-10-CM | POA: Diagnosis not present

## 2017-03-15 DIAGNOSIS — N411 Chronic prostatitis: Secondary | ICD-10-CM | POA: Diagnosis not present

## 2017-03-15 DIAGNOSIS — M542 Cervicalgia: Secondary | ICD-10-CM | POA: Diagnosis not present

## 2017-03-15 DIAGNOSIS — R29898 Other symptoms and signs involving the musculoskeletal system: Secondary | ICD-10-CM | POA: Diagnosis not present

## 2017-03-15 DIAGNOSIS — N528 Other male erectile dysfunction: Secondary | ICD-10-CM | POA: Diagnosis not present

## 2017-03-15 DIAGNOSIS — E119 Type 2 diabetes mellitus without complications: Secondary | ICD-10-CM | POA: Diagnosis not present

## 2017-03-15 DIAGNOSIS — R2689 Other abnormalities of gait and mobility: Secondary | ICD-10-CM | POA: Diagnosis not present

## 2017-03-15 DIAGNOSIS — Z9181 History of falling: Secondary | ICD-10-CM | POA: Diagnosis not present

## 2017-03-15 DIAGNOSIS — N401 Enlarged prostate with lower urinary tract symptoms: Secondary | ICD-10-CM | POA: Diagnosis not present

## 2017-03-16 DIAGNOSIS — B351 Tinea unguium: Secondary | ICD-10-CM | POA: Diagnosis not present

## 2017-03-16 DIAGNOSIS — E119 Type 2 diabetes mellitus without complications: Secondary | ICD-10-CM | POA: Diagnosis not present

## 2017-03-17 DIAGNOSIS — R29898 Other symptoms and signs involving the musculoskeletal system: Secondary | ICD-10-CM | POA: Diagnosis not present

## 2017-03-17 DIAGNOSIS — M542 Cervicalgia: Secondary | ICD-10-CM | POA: Diagnosis not present

## 2017-03-17 DIAGNOSIS — R2689 Other abnormalities of gait and mobility: Secondary | ICD-10-CM | POA: Diagnosis not present

## 2017-03-17 DIAGNOSIS — Z9181 History of falling: Secondary | ICD-10-CM | POA: Diagnosis not present

## 2017-03-19 ENCOUNTER — Telehealth: Payer: Self-pay | Admitting: Endocrinology

## 2017-03-19 ENCOUNTER — Other Ambulatory Visit: Payer: Self-pay

## 2017-03-19 MED ORDER — GLUCOSE BLOOD VI STRP: ORAL_STRIP | 3 refills | Status: DC

## 2017-03-19 NOTE — Telephone Encounter (Signed)
I called and spoke with patient's wife & notfied her that PA was approved. [facility]

## 2017-03-19 NOTE — Telephone Encounter (Signed)
[facility] for medication [facility] Next test strips [phone]

## 2017-03-22 DIAGNOSIS — M6281 Muscle weakness (generalized): Secondary | ICD-10-CM | POA: Diagnosis not present

## 2017-03-22 DIAGNOSIS — R29898 Other symptoms and signs involving the musculoskeletal system: Secondary | ICD-10-CM | POA: Diagnosis not present

## 2017-03-22 DIAGNOSIS — M542 Cervicalgia: Secondary | ICD-10-CM | POA: Diagnosis not present

## 2017-03-22 DIAGNOSIS — R2689 Other abnormalities of gait and mobility: Secondary | ICD-10-CM | POA: Diagnosis not present

## 2017-03-24 DIAGNOSIS — R2689 Other abnormalities of gait and mobility: Secondary | ICD-10-CM | POA: Diagnosis not present

## 2017-03-24 DIAGNOSIS — R29898 Other symptoms and signs involving the musculoskeletal system: Secondary | ICD-10-CM | POA: Diagnosis not present

## 2017-03-24 DIAGNOSIS — M6281 Muscle weakness (generalized): Secondary | ICD-10-CM | POA: Diagnosis not present

## 2017-03-24 DIAGNOSIS — M542 Cervicalgia: Secondary | ICD-10-CM | POA: Diagnosis not present

## 2017-03-26 ENCOUNTER — Encounter
Payer: BLUE CROSS/BLUE SHIELD | Attending: Physical Medicine & Rehabilitation | Admitting: Physical Medicine & Rehabilitation

## 2017-03-26 ENCOUNTER — Encounter: Payer: Self-pay | Admitting: Physical Medicine & Rehabilitation

## 2017-03-26 VITALS — BP 122/72 | HR 82

## 2017-03-26 DIAGNOSIS — E109 Type 1 diabetes mellitus without complications: Secondary | ICD-10-CM | POA: Diagnosis not present

## 2017-03-26 DIAGNOSIS — Z820 Family history of epilepsy and other diseases of the nervous system: Secondary | ICD-10-CM | POA: Insufficient documentation

## 2017-03-26 DIAGNOSIS — G4733 Obstructive sleep apnea (adult) (pediatric): Secondary | ICD-10-CM | POA: Diagnosis not present

## 2017-03-26 DIAGNOSIS — N529 Male erectile dysfunction, unspecified: Secondary | ICD-10-CM | POA: Insufficient documentation

## 2017-03-26 DIAGNOSIS — R531 Weakness: Secondary | ICD-10-CM | POA: Diagnosis not present

## 2017-03-26 DIAGNOSIS — M792 Neuralgia and neuritis, unspecified: Secondary | ICD-10-CM

## 2017-03-26 DIAGNOSIS — M4802 Spinal stenosis, cervical region: Secondary | ICD-10-CM | POA: Insufficient documentation

## 2017-03-26 DIAGNOSIS — G8918 Other acute postprocedural pain: Secondary | ICD-10-CM | POA: Diagnosis not present

## 2017-03-26 DIAGNOSIS — Z9641 Presence of insulin pump (external) (internal): Secondary | ICD-10-CM | POA: Diagnosis not present

## 2017-03-26 DIAGNOSIS — Z981 Arthrodesis status: Secondary | ICD-10-CM | POA: Insufficient documentation

## 2017-03-26 DIAGNOSIS — Z9889 Other specified postprocedural states: Secondary | ICD-10-CM | POA: Diagnosis not present

## 2017-03-26 DIAGNOSIS — Z8261 Family history of arthritis: Secondary | ICD-10-CM | POA: Diagnosis not present

## 2017-03-26 DIAGNOSIS — N319 Neuromuscular dysfunction of bladder, unspecified: Secondary | ICD-10-CM | POA: Diagnosis not present

## 2017-03-26 DIAGNOSIS — I10 Essential (primary) hypertension: Secondary | ICD-10-CM | POA: Diagnosis not present

## 2017-03-26 DIAGNOSIS — R269 Unspecified abnormalities of gait and mobility: Secondary | ICD-10-CM | POA: Diagnosis not present

## 2017-03-26 DIAGNOSIS — G959 Disease of spinal cord, unspecified: Secondary | ICD-10-CM | POA: Diagnosis not present

## 2017-03-26 DIAGNOSIS — G610 Guillain-Barre syndrome: Secondary | ICD-10-CM | POA: Diagnosis not present

## 2017-03-26 DIAGNOSIS — E785 Hyperlipidemia, unspecified: Secondary | ICD-10-CM | POA: Diagnosis not present

## 2017-03-26 DIAGNOSIS — G378 Other specified demyelinating diseases of central nervous system: Secondary | ICD-10-CM | POA: Diagnosis not present

## 2017-03-26 DIAGNOSIS — Z8249 Family history of ischemic heart disease and other diseases of the circulatory system: Secondary | ICD-10-CM | POA: Insufficient documentation

## 2017-03-26 DIAGNOSIS — Z833 Family history of diabetes mellitus: Secondary | ICD-10-CM | POA: Diagnosis not present

## 2017-03-26 DIAGNOSIS — N400 Enlarged prostate without lower urinary tract symptoms: Secondary | ICD-10-CM | POA: Diagnosis not present

## 2017-03-26 MED ORDER — PREGABALIN 50 MG PO CAPS: 50.0000 mg | ORAL_CAPSULE | Freq: Three times a day (TID) | ORAL | 1 refills | Status: DC

## 2017-03-26 NOTE — Progress Notes (Signed)
Subjective:    Patient ID: Kenneth Pace, male    DOB: 1950-08-07, 67 y.o.   MRN: [id number]  HPI 67 y.o. 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   Last clinic visit 02/26/17.  Since that time, patient states he is still in therapies.  He continues to follow up with Neurosurg.  He has obtained Lidoderm patches.  He continues to use tramadol.  He purchased a TENS unit with benefit. He started using a cane, without benefit.  Denies falls. His bladder have issued.    Pain Inventory Average Pain 0 Pain Right Now 0 My pain is no pain  In the last 24 hours, has pain interfered with the following? General activity 0 Relation with others 0 Enjoyment of life 0 What TIME of day is your pain at its worst? no pain Sleep (in general) Fair  Pain is worse with: no pain Pain improves with: no pain Relief from Meds: 0  Mobility walk with assistance use a cane use a walker ability to climb steps?  yes do you drive?  no  Function employed # of hrs/week 40 I need assistance with the following:  dressing, meal prep, household duties and shopping  Neuro/Psych weakness numbness tingling  Prior Studies Any changes since last visit?  no  Physicians involved in your care Any changes since last visit?  no   Family History  Problem Relation Age of Onset  . Dementia Mother   . Diabetes Mellitus I Mother   . Hypertension Father        60  . Healthy Sister   . Rheum arthritis Brother   . Cancer Neg Hx    Social History   Socioeconomic History  . Marital status: Married    Spouse name: None  . Number of children: None  . Years of education: None  . Highest education level: None  Social Needs  . Financial resource strain: None  . Food insecurity - worry: None  . Food insecurity - inability: None  . Transportation needs -  medical: None  . Transportation needs - non-medical: None  Occupational History  . 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 Smoking status: Never Smoker  . Smokeless tobacco: Never Used  Substance and Sexual Activity  . Alcohol use: No  . Drug use: No  . Sexual activity: None  Other Topics Concern  . None  Social History Narrative   He works for [employer]   He lives at home with wife.     Highest level of education:  BS, business admin   Past Surgical History:  Procedure Laterality Date  . ANTERIOR CERVICAL DECOMP/DISCECTOMY FUSION N/A 02/01/2017   Procedure: ANTERIOR CERVICAL DECOMPRESSION/DISCECTOMY FUSION CERVICAL THREE-FOUR , CERVICAL FOUR-FIVE  CERVICAL FIVE-SIX;  Surgeon: Consuella Lose, MD;  Location: [place];  Service: Neurosurgery;  Laterality: N/A;  . KNEE SURGERY     2 Lknee arthroscopy, 3 R knee arthroscpoy  . KNEE SURGERY Right 11/12/2016  . TONSILLECTOMY     Past Medical History:  Diagnosis Date  . ADENOIDECTOMY, HX OF 02/16/2007  . Anxiety    pt. states he does not have anxiety.  Marland Kitchen BENIGN PROSTATIC HYPERTROPHY, WITH OBSTRUCTION 02/03/2010  . DIABETES MELLITUS, TYPE I, UNCONTROLLED 12/29/2006  . ERECTILE DYSFUNCTION 02/16/2007  .  HYPERLIPIDEMIA 02/16/2007  . HYPERTENSION 02/16/2007  . TESTICULAR MASS, LEFT 02/16/2007   BP 122/72   Pulse 82   SpO2 97%   Opioid Risk Score:   Fall Risk Score:  `1  Depression screen PHQ 2/9  Depression screen [facility] 2/9 02/26/2017 02/26/2017  Decreased Interest 0 0  Down, Depressed, Hopeless 0 0  PHQ - 2 Score 0 0  Altered sleeping 1 -  Tired, decreased energy 0 -  Change in appetite 0 -  Feeling bad or failure about yourself  0 -  Trouble concentrating 0 -  Moving slowly or fidgety/restless 1 -  Suicidal thoughts 0 -  PHQ-9 Score 2 -  Difficult doing work/chores Not difficult at all -   Review of Systems  HENT: Negative.   Eyes:  Negative.   Respiratory: Negative.   Cardiovascular: Negative.   Gastrointestinal: Negative.   Endocrine: Negative.   Genitourinary: Negative.   Musculoskeletal: Positive for arthralgias, gait problem, myalgias and neck pain.  Skin: Negative.   Allergic/Immunologic: Negative.   Neurological: Positive for weakness and numbness.  Hematological: Negative.   All other systems reviewed and are negative.     Objective:   Physical Exam Constitutional: He appears well-developed and well-nourished. No distress.  HENT: Normocephalic and atraumatic.  Eyes: EOM are normal. No discharge.  Neck: Right neck with honey comb dressing and edema, improving Cardiovascular: RRR. No JVD.  Respiratory: Effort normal and breath sounds normal.  GI: He exhibits no distention. BS+  Musculoskeletal: He exhibits no edema or tenderness in extremities.  Neurological: He is alert and oriented.  Motor: B/L UE 4+-5/5 proximal to distal (improving) B/l LE: HF 4+/5, KE, ADF/PF 4+/5 Sensation to light touch improving Skin: Skin is warm and dry. No rash noted. He is not diaphoretic. No erythema.  Psychiatric: He has a normal mood and affect. His behavior is normal. Judgment and thought content normal.     Assessment & Plan:  67 y.o. 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   1.  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  Cont therapies  Cont follow up Neurosurg   2.  Pain Management:   Gabapentin d/ced due to edema  Cont Lidoderm patch  Will order Lyrica 50 TID   Encouraged to wean Tramadol 50 qhs  Cont TENS  3. Gait abnormality  Cont therapies  Cont walker for safety

## 2017-03-29 DIAGNOSIS — R2689 Other abnormalities of gait and mobility: Secondary | ICD-10-CM | POA: Diagnosis not present

## 2017-03-29 DIAGNOSIS — M542 Cervicalgia: Secondary | ICD-10-CM | POA: Diagnosis not present

## 2017-03-29 DIAGNOSIS — R29898 Other symptoms and signs involving the musculoskeletal system: Secondary | ICD-10-CM | POA: Diagnosis not present

## 2017-03-29 DIAGNOSIS — M6281 Muscle weakness (generalized): Secondary | ICD-10-CM | POA: Diagnosis not present

## 2017-03-30 ENCOUNTER — Telehealth: Payer: Self-pay | Admitting: Endocrinology

## 2017-03-30 ENCOUNTER — Other Ambulatory Visit: Payer: Self-pay

## 2017-03-30 MED ORDER — GLUCOSE BLOOD VI STRP: ORAL_STRIP | 3 refills | Status: DC

## 2017-03-30 NOTE — Telephone Encounter (Signed)
I have sent prescription to pharmacy.  

## 2017-03-30 NOTE — Telephone Encounter (Signed)
Need a prescription test strips for glucose blood (BAYER CONTOUR NEXT TEST) test strip [[id number]]  Send to Pharmacy:  [facility] [address], [place] [address] #:  [id number]

## 2017-03-31 DIAGNOSIS — M6281 Muscle weakness (generalized): Secondary | ICD-10-CM | POA: Diagnosis not present

## 2017-03-31 DIAGNOSIS — R29898 Other symptoms and signs involving the musculoskeletal system: Secondary | ICD-10-CM | POA: Diagnosis not present

## 2017-03-31 DIAGNOSIS — M542 Cervicalgia: Secondary | ICD-10-CM | POA: Diagnosis not present

## 2017-03-31 DIAGNOSIS — R2689 Other abnormalities of gait and mobility: Secondary | ICD-10-CM | POA: Diagnosis not present

## 2017-04-02 ENCOUNTER — Other Ambulatory Visit: Payer: Self-pay | Admitting: Endocrinology

## 2017-04-05 ENCOUNTER — Telehealth: Payer: Self-pay | Admitting: *Deleted

## 2017-04-05 DIAGNOSIS — M542 Cervicalgia: Secondary | ICD-10-CM | POA: Diagnosis not present

## 2017-04-05 DIAGNOSIS — R2689 Other abnormalities of gait and mobility: Secondary | ICD-10-CM | POA: Diagnosis not present

## 2017-04-05 DIAGNOSIS — M6281 Muscle weakness (generalized): Secondary | ICD-10-CM | POA: Diagnosis not present

## 2017-04-05 DIAGNOSIS — R29898 Other symptoms and signs involving the musculoskeletal system: Secondary | ICD-10-CM | POA: Diagnosis not present

## 2017-04-05 NOTE — Telephone Encounter (Signed)
Thanks

## 2017-04-05 NOTE — Telephone Encounter (Signed)
Having same side effects that he had with the neurontin (swelling) and side effects outweigh benefit so he is not taking medication.

## 2017-04-06 ENCOUNTER — Telehealth: Payer: Self-pay | Admitting: Neurology

## 2017-04-06 NOTE — Telephone Encounter (Signed)
Levada Dy called from [place] regarding a disability form that needs to be completed. They are checking the status. The claim # is [id number]. Thanks

## 2017-04-07 DIAGNOSIS — R2689 Other abnormalities of gait and mobility: Secondary | ICD-10-CM | POA: Diagnosis not present

## 2017-04-07 DIAGNOSIS — M542 Cervicalgia: Secondary | ICD-10-CM | POA: Diagnosis not present

## 2017-04-07 DIAGNOSIS — R29898 Other symptoms and signs involving the musculoskeletal system: Secondary | ICD-10-CM | POA: Diagnosis not present

## 2017-04-07 DIAGNOSIS — M6281 Muscle weakness (generalized): Secondary | ICD-10-CM | POA: Diagnosis not present

## 2017-04-07 NOTE — Telephone Encounter (Signed)
[facility] and requested for a new form to be faxed to me.

## 2017-04-08 ENCOUNTER — Telehealth: Payer: Self-pay | Admitting: Physical Medicine & Rehabilitation

## 2017-04-08 NOTE — Telephone Encounter (Signed)
I do not mind making a referral, but as mentioned, due to the fact that the patient is still recovering an FCE at this point will be premature, as his limitations will likely continue to decrease.  Thanks.

## 2017-04-08 NOTE — Telephone Encounter (Signed)
Please advise Pt needs referral for FCE to [facility].

## 2017-04-12 DIAGNOSIS — M542 Cervicalgia: Secondary | ICD-10-CM | POA: Diagnosis not present

## 2017-04-12 DIAGNOSIS — M6281 Muscle weakness (generalized): Secondary | ICD-10-CM | POA: Diagnosis not present

## 2017-04-12 DIAGNOSIS — R29898 Other symptoms and signs involving the musculoskeletal system: Secondary | ICD-10-CM | POA: Diagnosis not present

## 2017-04-12 DIAGNOSIS — R2689 Other abnormalities of gait and mobility: Secondary | ICD-10-CM | POA: Diagnosis not present

## 2017-04-14 ENCOUNTER — Encounter: Payer: Self-pay | Admitting: Neurology

## 2017-04-14 ENCOUNTER — Ambulatory Visit (INDEPENDENT_AMBULATORY_CARE_PROVIDER_SITE_OTHER): Payer: BLUE CROSS/BLUE SHIELD | Admitting: Neurology

## 2017-04-14 VITALS — BP 130/70 | HR 76 | Wt 224.4 lb

## 2017-04-14 DIAGNOSIS — G959 Disease of spinal cord, unspecified: Secondary | ICD-10-CM | POA: Diagnosis not present

## 2017-04-14 DIAGNOSIS — R2689 Other abnormalities of gait and mobility: Secondary | ICD-10-CM | POA: Diagnosis not present

## 2017-04-14 DIAGNOSIS — G378 Other specified demyelinating diseases of central nervous system: Secondary | ICD-10-CM | POA: Diagnosis not present

## 2017-04-14 DIAGNOSIS — G6289 Other specified polyneuropathies: Secondary | ICD-10-CM | POA: Diagnosis not present

## 2017-04-14 DIAGNOSIS — M6281 Muscle weakness (generalized): Secondary | ICD-10-CM | POA: Diagnosis not present

## 2017-04-14 DIAGNOSIS — M542 Cervicalgia: Secondary | ICD-10-CM | POA: Diagnosis not present

## 2017-04-14 DIAGNOSIS — R29898 Other symptoms and signs involving the musculoskeletal system: Secondary | ICD-10-CM | POA: Diagnosis not present

## 2017-04-14 MED ORDER — BACLOFEN 10 MG PO TABS: 10.0000 mg | ORAL_TABLET | Freq: Three times a day (TID) | ORAL | 11 refills | Status: DC

## 2017-04-14 NOTE — Progress Notes (Signed)
Follow-up Visit   Date: 04/14/17   Kenneth Pace MRN: [id number] DOB: 13-Dec-1950   Interim History: Kenneth Pace is a 66 y.o. right-handed Caucasian male with insulin-dependent diabetes mellitus, hypertension, OSA on CPAP, hyperlipidemia, anxiety, and cervical myelopathy s/p decompression at C3-C6 returning to the clinic for follow-up of CIDP  The patient was accompanied to the clinic by wife who also provides collateral information.    History of present illness: Around late August, he started having electrical impulses of the arms, which started in the neck and radiates into the palm.  He also recalls having numbness of the arms.  He suffered a fall prior to this so he felt the he may have injured his neck.  A few weeks later, he also noticed weakness of the hands and unable to type at work.  In mid-September, he began having gait imbalance and started falling frequently. Since then, he has numbness/tingling of the feet and intermittent sharp pain radiating from his back into his mid-chest.  He has weakness of the both legs and has difficulty standing, climbing steps, and walking.  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 He does not have any difficulty swallowing, talking, or shortness of breath.   He initially thought his leg symptoms were stemming from his right knee pain and he was already scheduled to have R TKA on 9/27 which completed alleviated his knee pain, but his leg weakness and falls has worsened.   He denies any preceding illness, insect bites, and travel.   His testing showed albuminocytologic disocciation with CSF protein 81 and normal cell count.   He completed 5-days IVIG with the last dose on 10/9 and felt some improvement in the hands, but legs were weaker.  UPDATE 04/14/2017:  Since he was last here in October,  he developed progressive leg weakness and was found to have myelopathic findings.  Imaging of the cervical spine showed moderate multilevel stenosis.  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 Kathyrn Sheriff on 02/01/2017 which has significantly improved his gait and lower extremity strength.  He was at inpatient rehab from 12/19 - 12/28 and made progress with leg strength and gait.  He has been seeing Dr. Delice Lesch, PM&R, for rehab and pain management and getting out-patient physical therapy.   He continues to have numbness and tingling of the feet, as well as significant leg stiffness and imbalance.  Most of the time, he walks with a cane, but wife states he would be more stable with a walker.    Medications:  Current Outpatient Medications on File Prior to Visit  Medication Sig Dispense Refill  . acetaminophen (TYLENOL) 325 MG tablet Take 2 tablets (650 mg total) by mouth every 6 (six) hours as needed for mild pain or headache.    . busPIRone (BUSPAR) 30 MG tablet TAKE ONE-HALF (1/2) TABLET (15 MG) TWICE A DAY (Patient taking differently: 30MG BY MOUTH AT BEDTIME) 180 tablet 1  . Continuous Glucose Monitor Sup (ENLITE GLUCOSE SENSOR) MISC 1 Device by Does not apply route every 3 (three) days. 30 each 3  . Cranberry 400 MG TABS Take 2 each by mouth daily.      . Cyanocobalamin (VITAMIN B-12 CR PO) Take 1 each by  mouth daily.      . finasteride (PROSCAR) 5 MG tablet Take 5 mg by mouth daily.     Marland Kitchen glucagon 1 MG injection Inject 1 mg into the vein once as needed. (Patient taking differently: Inject 1 mg once as needed into the vein (severe hypoglycemia). ) 1 each 12  . glucose blood (BAYER CONTOUR NEXT TEST) test strip Use to check blood sugar 5 times per day dx 250.01, 500 each 3  . HUMALOG 100 UNIT/ML injection INJECT 120 UNITS DAILY  IN PUMP 100 mL 11  . Insulin Infusion Pump Supplies (PARADIGM RESERVOIR 3ML) MISC 1 Device by Does not apply route every 3 (three) days. 30 each 3  . Insulin Infusion Pump Supplies (QUICK-SET INFUSION 43" 9MM) MISC 1 Device by Does not apply route every 3 (three) days. 30 each 3  . Lancets Misc. (ACCU-CHEK MULTICLIX LANCET DEV) KIT 1 Device by Other route 5 (five) times daily as needed. 5/day dx 250.01 450 each 3  . levETIRAcetam (KEPPRA) 500 MG tablet TK 1 T PO BID  0  . lidocaine (LIDODERM) 5 % Place 1 patch onto the skin daily. At 7 am and remove at 7 pm. Available over the counter. 30 patch 1  . lisinopril-hydrochlorothiazide (PRINZIDE,ZESTORETIC) 10-12.5 MG tablet TAKE 1 TABLET DAILY 90 tablet 1  . Multiple Vitamins-Minerals (MULTIVITAMIN,TX-MINERALS) tablet Take 1 tablet by mouth daily.      . pregabalin (LYRICA) 50 MG capsule Take 1 capsule (50 mg total) by mouth 3 (three) times daily. 90 capsule 1  . traMADol (ULTRAM) 50 MG tablet Take 1 tablet (50 mg total) by mouth every 6 (six) hours as needed. 30 tablet 1   No current facility-administered medications on file prior to visit.     Allergies: No Known Allergies  Review of Systems:  CONSTITUTIONAL: No fevers, chills, night sweats, or weight loss.  EYES: No visual changes or eye pain ENT: No hearing changes.  No history of nose bleeds.   RESPIRATORY: No cough, wheezing and shortness of breath.   CARDIOVASCULAR: Negative for chest pain, and palpitations.   GI: Negative for abdominal discomfort, blood in stools or black stools.  No recent change in bowel habits.   GU:  No history of incontinence.   MUSCLOSKELETAL: No history of joint pain or swelling.  No myalgias.   SKIN: Negative for lesions, rash, and itching.   ENDOCRINE: Negative for cold or heat intolerance, polydipsia or goiter.   PSYCH:  No depression or anxiety symptoms.   NEURO: As Above.   Vital Signs:  BP 130/70   Pulse 76   Wt 224 lb 6 oz (101.8 kg)   SpO2 95%    BMI 30.43 kg/m   General: Well appearing, comfortable   Neurological Exam: MENTAL STATUS including orientation to time, place, person, recent and remote memory, attention span and concentration, language, and fund of knowledge is normal.  Speech is not dysarthric.  CRANIAL NERVES:  Pupils equal round and reactive to light.  Normal conjugate, extra-ocular eye movements in all directions of gaze.  No ptosis. Face is symmetric. Palate elevates symmetrically.  Tongue is midline.   MOTOR:  There is moderate right FDI and ADM atrophy.  No fasciculations or abnormal movements.  No pronator drift.    Right Upper Extremity:    Left Upper Extremity:    Deltoid  5/5   Deltoid  5/5   Biceps  5/5   Biceps  5/5   Triceps  5/5   Triceps  5/5   Wrist extensors  5/5   Wrist extensors  5/5   Wrist flexors  5/5   Wrist flexors  5/5   Finger extensors  5/5   Finger extensors  5/5   Finger flexors  5/5   Finger flexors  5/5   Dorsal interossei  4+/5   Dorsal interossei  5/5   Abductor pollicis  5/5   Abductor pollicis  5/5   Tone (Ashworth scale)  0  Tone (Ashworth scale)  0   Right Lower Extremity:    Left Lower Extremity:    Hip flexors  5/5   Hip flexors  5/5   Hip extensors  5/5   Hip extensors  5/5   Knee flexors  5/5   Knee flexors  5/5   Knee extensors  5/5   Knee extensors  5/5   Dorsiflexors  5/5   Dorsiflexors  5/5   Plantarflexors  5/5   Plantarflexors  5/5   Toe extensors  5/5   Toe extensors  5/5   Toe flexors  5/5   Toe flexors  5/5   Tone (Ashworth scale)  1  Tone (Ashworth scale)  1    MSRs:  Right                                                                 Left brachioradialis 2+  brachioradialis 2+  biceps 2+  biceps 2+  triceps 2+  triceps 2+  patellar 3+  patellar 3+  ankle jerk 2+  ankle jerk 2+  Hoffman no  Hoffman no  plantar response down  plantar response down   SENSORY:  Vibration is reduced to 90% at the MCP, 100% at the elbow and knees, and 60% distal to  ankles.  There is a gradient pattern of sensory loss distal to mid-calf to pin prick and temperature.  Proprioception at the great toe is intact.  There is mild sway with Rhomberg testing.  COORDINATION/GAIT: Gait is severely spastic appearing, wide based, assisted with single-prong cane and appears unsteady.   Data: MRI cervical and lumbar spine 11/20/2016: 1. No focal cord signal abnormality or lesion. 2. Multilevel spondylosis of the cervical spine as described. 3. Moderate central and mild bilateral foraminal stenosis at C3-4. 4. Moderate central and bilateral foraminal narrowing at C4-5. 5. Moderate central and severe bilateral foraminal stenosis at C5-6. 6. Moderate foraminal narrowing bilaterally at C6-7 without significant central canal stenosis. 7. Short pedicles in the cervical and lumbar spine contribute to the stenosis. 8. Disc bulging at L2-3 and L3-4 without significant stenosis. 9. Disc bulging and facet hypertrophy at L4-5 with mild subarticular and foraminal stenosis bilaterally. 10. Mild left foraminal narrowing at L5-S1 secondary to asymmetric facet hypertrophy.  CSF 11/20/2016:  R1 W3 P81* G102*  No OCB, VDRL neg Labs 11/20/2016:  SPEP with IFE no M protein, 486, TSH 1.051  Lab Results  Component Value Date   HGBA1C 7.7 03/01/2017   MRI cervical spine 12/31/2016:   1. No enlargement of the cranial nerves or cervical nerve roots. 2. No acute intracranial abnormality. 3. Multilevel moderate cervical spinal canal stenosis, worst at C3-4 and C5-6 with associated mild cord deformity but no cord signal change. 4. Moderate to severe neural foraminal stenosis at  C3-4, C4-5 and C5-6.  IMPRESSION/PLAN: 1.  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  He was treated with IVIG x 5 and PLEX with minimal improvement, and then developed new findings of leg  weakness and myelopathy secondary to severe cervical canal stenosis. He is doing remarkably better with respect to his weakness since undergoing surgical decompression and family his very appreciative for Dr. Kathyrn Sheriff.    He continues to have paresthesias of the feet and exam with length-dependent signs of neuropathy.  I have reviewed his NCS/EMG again which shows demyelinating neuropathy.  It was explained that these changes are not seen in cervical myelopathy and therefore, there are two separate disease processes. Elevated CSF protein can be seen in both conditions.  To help determine whether he has an ongoing neuropathy, I recommend repeat NCS/EMG of the right arm and leg to assess for any interval changes.  Based on the results of this testing, I will determine whether he needs additional immunotherapy.  2.  Cervical canal stenosis s/p decompression from C3-6.  Recommend starting baclofen 49m BID and titrating to 16mTID for spastic gait.  Strongly urged patient to be compliant with leg stretching exercises.  I have asked him to use a walker there is not enough stability from a cane.  Appreciate Dr. AnDelice Leschollowing.  Return to clinic in 2 months  Greater than 50% of this 30 minute visit was spent in counseling, explanation of diagnosis, planning of further management, and coordination of care.   Thank you for allowing me to participate in patient's care.  If I can answer any additional questions, I would be pleased to do so.    Sincerely,    Melea Prezioso K. PaPosey ProntoDO

## 2017-04-14 NOTE — Patient Instructions (Addendum)
NCA/EMG of the right arm and leg   Baclofen 10 mg tablets    Morning       Afternoon        Evening  Day 1-5 1/2 tab                                1/2 tab              Day 6-10 1/2 tab         1/2 tab            1/2 tab              Day 11-15 1/2 tab         1/2 tab            1 tab                Day 16-20 1 tab            1/2 tab            1 tab                Continue 1 tab             1 tab              1 tab                 Return to clinic on April 30th at 3pm.

## 2017-04-19 DIAGNOSIS — R29898 Other symptoms and signs involving the musculoskeletal system: Secondary | ICD-10-CM | POA: Diagnosis not present

## 2017-04-19 DIAGNOSIS — M542 Cervicalgia: Secondary | ICD-10-CM | POA: Diagnosis not present

## 2017-04-21 ENCOUNTER — Encounter: Payer: Self-pay | Admitting: Physical Medicine & Rehabilitation

## 2017-04-21 ENCOUNTER — Encounter
Payer: BLUE CROSS/BLUE SHIELD | Attending: Physical Medicine & Rehabilitation | Admitting: Physical Medicine & Rehabilitation

## 2017-04-21 VITALS — BP 120/85 | HR 87 | Resp 14

## 2017-04-21 DIAGNOSIS — E109 Type 1 diabetes mellitus without complications: Secondary | ICD-10-CM | POA: Insufficient documentation

## 2017-04-21 DIAGNOSIS — R531 Weakness: Secondary | ICD-10-CM | POA: Diagnosis not present

## 2017-04-21 DIAGNOSIS — N400 Enlarged prostate without lower urinary tract symptoms: Secondary | ICD-10-CM | POA: Diagnosis not present

## 2017-04-21 DIAGNOSIS — Z9889 Other specified postprocedural states: Secondary | ICD-10-CM | POA: Diagnosis not present

## 2017-04-21 DIAGNOSIS — Z9641 Presence of insulin pump (external) (internal): Secondary | ICD-10-CM | POA: Insufficient documentation

## 2017-04-21 DIAGNOSIS — Z981 Arthrodesis status: Secondary | ICD-10-CM | POA: Insufficient documentation

## 2017-04-21 DIAGNOSIS — R269 Unspecified abnormalities of gait and mobility: Secondary | ICD-10-CM | POA: Insufficient documentation

## 2017-04-21 DIAGNOSIS — G8918 Other acute postprocedural pain: Secondary | ICD-10-CM

## 2017-04-21 DIAGNOSIS — Z8249 Family history of ischemic heart disease and other diseases of the circulatory system: Secondary | ICD-10-CM | POA: Insufficient documentation

## 2017-04-21 DIAGNOSIS — N319 Neuromuscular dysfunction of bladder, unspecified: Secondary | ICD-10-CM | POA: Diagnosis not present

## 2017-04-21 DIAGNOSIS — N529 Male erectile dysfunction, unspecified: Secondary | ICD-10-CM | POA: Diagnosis not present

## 2017-04-21 DIAGNOSIS — I10 Essential (primary) hypertension: Secondary | ICD-10-CM | POA: Insufficient documentation

## 2017-04-21 DIAGNOSIS — Z820 Family history of epilepsy and other diseases of the nervous system: Secondary | ICD-10-CM | POA: Diagnosis not present

## 2017-04-21 DIAGNOSIS — Z833 Family history of diabetes mellitus: Secondary | ICD-10-CM | POA: Diagnosis not present

## 2017-04-21 DIAGNOSIS — M792 Neuralgia and neuritis, unspecified: Secondary | ICD-10-CM

## 2017-04-21 DIAGNOSIS — M4802 Spinal stenosis, cervical region: Secondary | ICD-10-CM | POA: Diagnosis not present

## 2017-04-21 DIAGNOSIS — Z8261 Family history of arthritis: Secondary | ICD-10-CM | POA: Insufficient documentation

## 2017-04-21 DIAGNOSIS — G4733 Obstructive sleep apnea (adult) (pediatric): Secondary | ICD-10-CM | POA: Diagnosis not present

## 2017-04-21 DIAGNOSIS — G610 Guillain-Barre syndrome: Secondary | ICD-10-CM | POA: Diagnosis not present

## 2017-04-21 DIAGNOSIS — E785 Hyperlipidemia, unspecified: Secondary | ICD-10-CM | POA: Diagnosis not present

## 2017-04-21 DIAGNOSIS — G959 Disease of spinal cord, unspecified: Secondary | ICD-10-CM | POA: Insufficient documentation

## 2017-04-21 DIAGNOSIS — M4712 Other spondylosis with myelopathy, cervical region: Secondary | ICD-10-CM | POA: Diagnosis not present

## 2017-04-21 DIAGNOSIS — G378 Other specified demyelinating diseases of central nervous system: Secondary | ICD-10-CM

## 2017-04-21 MED ORDER — AMITRIPTYLINE HCL 10 MG PO TABS: 10.0000 mg | ORAL_TABLET | Freq: Every day | ORAL | 1 refills | Status: DC

## 2017-04-21 NOTE — Progress Notes (Signed)
Subjective:    Patient ID: Kenneth Pace, male    DOB: 01/11/1951, 67 y.o.   MRN: [id number]  HPI 67 y.o. 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   Last clinic visit 03/26/17.  Since that time, received communication from Neurology regarding walker and repeat NCS/EMG.  Since that time, pt states he does not a walker.  He notes improvement during the day, but some difficulty early AM and night.  He plans to go back to work with rollator and walker. He is still in therapies. He sees Neurosurg in 2 weeks. He had edema with Lyrica as well.    Pain Inventory Average Pain 5 Pain Right Now 3 My pain is dull and tingling  In the last 24 hours, has pain interfered with the following? General activity 4 Relation with others 0 Enjoyment of life 1 What TIME of day is your pain at its worst? morning, night Sleep (in general) Good  Pain is worse with: other Pain improves with: TENS and . Relief from Meds: 5  Mobility walk with assistance use a cane ability to climb steps?  yes do you drive?  no  Function employed # of hrs/week 40  Neuro/Psych tingling  Prior Studies Any changes since last visit?  no  Physicians involved in your care Any changes since last visit?  no   Family History  Problem Relation Age of Onset  . Dementia Mother   . Diabetes Mellitus I Mother   . Hypertension Father        75  . Healthy Sister   . Rheum arthritis Brother   . Cancer Neg Hx    Social History   Socioeconomic History  . Marital status: Married    Spouse name: None  . Number of children: None  . Years of education: None  . Highest education level: None  Social Needs  . Financial resource strain: None  . Food insecurity - worry: None  . Food insecurity - inability: None  . Transportation needs - medical: None  . Transportation  needs - non-medical: None  Occupational History  . Occupation: Lobbyist: [employer]    Comment: [employer]  Tobacco Use  . Smoking status: Never Smoker  . Smokeless tobacco: Never Used  Substance and Sexual Activity  . Alcohol use: No  . Drug use: No  . Sexual activity: None  Other Topics Concern  . None  Social History Narrative   He works for [employer]   He lives at home with wife.     Highest level of education:  BS, business admin   Past Surgical History:  Procedure Laterality Date  . ANTERIOR CERVICAL DECOMP/DISCECTOMY FUSION N/A 02/01/2017   Procedure: ANTERIOR CERVICAL DECOMPRESSION/DISCECTOMY FUSION CERVICAL THREE-FOUR , CERVICAL FOUR-FIVE  CERVICAL FIVE-SIX;  Surgeon: Consuella Lose, MD;  Location: [facility];  Service: Neurosurgery;  Laterality: N/A;  . KNEE SURGERY     2 Lknee arthroscopy, 3 R knee arthroscpoy  . KNEE SURGERY Right 11/12/2016  . TONSILLECTOMY     Past Medical History:  Diagnosis Date  . ADENOIDECTOMY, HX OF 02/16/2007  . Anxiety    pt. states he does not have anxiety.  Marland Kitchen BENIGN PROSTATIC HYPERTROPHY, WITH OBSTRUCTION 02/03/2010  . DIABETES MELLITUS, TYPE I, UNCONTROLLED 12/29/2006  . ERECTILE DYSFUNCTION 02/16/2007  . HYPERLIPIDEMIA  02/16/2007  . HYPERTENSION 02/16/2007  . TESTICULAR MASS, LEFT 02/16/2007   BP 120/85 (BP Location: Left Arm)   Pulse 87   Resp 14   SpO2 97%   Opioid Risk Score:   Fall Risk Score:  `1  Depression screen PHQ 2/9  Depression screen [facility] 2/9 02/26/2017 02/26/2017  Decreased Interest 0 0  Down, Depressed, Hopeless 0 0  PHQ - 2 Score 0 0  Altered sleeping 1 -  Tired, decreased energy 0 -  Change in appetite 0 -  Feeling bad or failure about yourself  0 -  Trouble concentrating 0 -  Moving slowly or fidgety/restless 1 -  Suicidal thoughts 0 -  PHQ-9 Score 2 -  Difficult doing work/chores Not difficult at all -   Review of Systems  HENT: Negative.     Eyes: Negative.   Respiratory: Negative.   Cardiovascular: Negative.   Gastrointestinal: Negative.   Endocrine: Negative.   Genitourinary: Negative.   Musculoskeletal: Positive for arthralgias, gait problem, myalgias and neck pain.  Skin: Negative.   Allergic/Immunologic: Negative.   Hematological: Negative.   All other systems reviewed and are negative.     Objective:   Physical Exam Constitutional: He appears well-developed and well-nourished. No distress.  HENT: Normocephalic and atraumatic.  Eyes: EOM are normal. No discharge.  Neck: Right neck with honey comb dressing and edema, improving Cardiovascular: RRR. No JVD.  Respiratory: Effort normal and breath sounds normal.  GI: He exhibits no distention. BS+  Musculoskeletal: He exhibits no edema or tenderness in extremities.  Neurological: He is alert and oriented.  Motor: B/L UE 5/5 proximal to distal  B/l LE: HF 5/5, KE, ADF/PF 5/5 Sensation to light touch improving Skin: Skin is warm and dry. No rash noted. He is not diaphoretic. No erythema.  Psychiatric: He has a normal mood and affect. His behavior is normal. Judgment and thought content normal.     Assessment & Plan:  67 y.o. 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   1.  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  Cont therapies  Cont follow up Neurosurg  Discussed return to sedentary work    2.  Pain Management:   Gabapentin and Lyrica d/ced due to edema  Cont Lidoderm patch  Weaned Tramadol 50 qhs  Cont TENS  Currently on Keppra per Neurosurg, ineffective, encouraged to wean  [place]il Elavil 10 qhs   Cont Baclofen per Neurology  3. Gait abnormality  Cont therapies  Cont cane/walker for safety  >35 minutes spent  with patient, with >30 minutes in counseling regarding return to work

## 2017-04-22 DIAGNOSIS — M542 Cervicalgia: Secondary | ICD-10-CM | POA: Diagnosis not present

## 2017-04-22 DIAGNOSIS — R29898 Other symptoms and signs involving the musculoskeletal system: Secondary | ICD-10-CM | POA: Diagnosis not present

## 2017-04-26 DIAGNOSIS — M542 Cervicalgia: Secondary | ICD-10-CM | POA: Diagnosis not present

## 2017-04-26 DIAGNOSIS — R29898 Other symptoms and signs involving the musculoskeletal system: Secondary | ICD-10-CM | POA: Diagnosis not present

## 2017-04-27 ENCOUNTER — Ambulatory Visit (INDEPENDENT_AMBULATORY_CARE_PROVIDER_SITE_OTHER): Payer: BLUE CROSS/BLUE SHIELD | Admitting: Neurology

## 2017-04-27 DIAGNOSIS — G6181 Chronic inflammatory demyelinating polyneuritis: Secondary | ICD-10-CM

## 2017-04-27 DIAGNOSIS — G6289 Other specified polyneuropathies: Secondary | ICD-10-CM

## 2017-04-27 DIAGNOSIS — G959 Disease of spinal cord, unspecified: Secondary | ICD-10-CM

## 2017-04-27 DIAGNOSIS — M542 Cervicalgia: Secondary | ICD-10-CM | POA: Diagnosis not present

## 2017-04-27 DIAGNOSIS — R29898 Other symptoms and signs involving the musculoskeletal system: Secondary | ICD-10-CM | POA: Diagnosis not present

## 2017-04-29 ENCOUNTER — Encounter: Payer: Self-pay | Admitting: Endocrinology

## 2017-04-29 ENCOUNTER — Ambulatory Visit: Payer: BLUE CROSS/BLUE SHIELD | Admitting: Endocrinology

## 2017-04-29 VITALS — BP 122/72 | HR 83 | Wt 226.2 lb

## 2017-04-29 DIAGNOSIS — M542 Cervicalgia: Secondary | ICD-10-CM | POA: Diagnosis not present

## 2017-04-29 DIAGNOSIS — R29898 Other symptoms and signs involving the musculoskeletal system: Secondary | ICD-10-CM | POA: Diagnosis not present

## 2017-04-29 DIAGNOSIS — E119 Type 2 diabetes mellitus without complications: Secondary | ICD-10-CM | POA: Diagnosis not present

## 2017-04-29 LAB — POCT GLYCOSYLATED HEMOGLOBIN (HGB A1C): HEMOGLOBIN A1C: 8.9

## 2017-04-29 MED ORDER — GLUCOSE BLOOD VI STRP: ORAL_STRIP | 3 refills | Status: DC

## 2017-04-29 NOTE — Patient Instructions (Addendum)
Please come back for a regular physical appointment in 2 months.  Please continue these pump settings: basal rate of 4.0 units/hr 6 AM-10 PM, and 2.5 units/hr 10 PM-6 AM mealtime bolus of 1 unit with each meal.   correction bolus (which some people call "sensitivity," or "insulin sensitivity ratio," or just "isr") of 1 unit for each 50 by which your glucose exceeds 100.  suspend the pump for 1-2 hrs, with activity.  If not, eat a light snack with it.   On this type of insulin schedule, you should eat meals on a regular schedule.  If a meal is missed or significantly delayed, your blood sugar could go low.

## 2017-04-29 NOTE — Progress Notes (Signed)
Subjective:    Patient ID: Kenneth Pace, male    DOB: 07/22/1950, 67 y.o.   MRN: [id number]  HPI Pt returns for f/u of diabetes mellitus:  DM type: 1 Dx'ed: 7628 Complications: retinopathy.   Therapy: insulin since dx DKA: only at dx Severe hypoglycemia: most recently in 2014.  Pancreatitis: never Other: Kenneth Pace has a medtronic pump and continuous glucose monitor; Kenneth Pace is still on short-term disability.  continuous glucose monitor ID is [email or web address] and password is [email or web address] Interval history:   Kenneth Pace takes these pump settings:  basal rate of 3.0 units/hr, 24 hrs per day.   mealtime bolus of 1 unit with each meal.  continue correction bolus (which some people call "sensitivity," or "insulin sensitivity ratio," or just "isr") of 1 unit for each 50 by which glucose exceeds 100.  suspends the pump for 1-2 hrs, with activity.  If not, eat a light snack with it.   no cbg record, but states cbg's vary from 90-300's.  It is in general higher as the day goes on.   Kenneth Pace says Kenneth Pace seldom has hypoglycemia, and these episodes are mild.  No recent steroids.  Leg weakness is slowly improving.  Past Medical History:  Diagnosis Date  . ADENOIDECTOMY, HX OF 02/16/2007  . Anxiety    pt. states Kenneth Pace does not have anxiety.  Marland Kitchen BENIGN PROSTATIC HYPERTROPHY, WITH OBSTRUCTION 02/03/2010  . DIABETES MELLITUS, TYPE I, UNCONTROLLED 12/29/2006  . ERECTILE DYSFUNCTION 02/16/2007  . HYPERLIPIDEMIA 02/16/2007  . HYPERTENSION 02/16/2007  . TESTICULAR MASS, LEFT 02/16/2007    Past Surgical History:  Procedure Laterality Date  . ANTERIOR CERVICAL DECOMP/DISCECTOMY FUSION N/A 02/01/2017   Procedure: ANTERIOR CERVICAL DECOMPRESSION/DISCECTOMY FUSION CERVICAL THREE-FOUR , CERVICAL FOUR-FIVE  CERVICAL FIVE-SIX;  Surgeon: Consuella Lose, MD;  Location: [facility];  Service: Neurosurgery;  Laterality: N/A;  . KNEE SURGERY     2 Lknee arthroscopy, 3 R knee arthroscpoy  . KNEE SURGERY Right 11/12/2016  . TONSILLECTOMY       Social History   Socioeconomic History  . Marital status: Married    Spouse name: Not on file  . Number of children: Not on file  . Years of education: Not on file  . Highest education level: Not on file  Social Needs  . Financial resource strain: Not on file  . Food insecurity - worry: Not on file  . Food insecurity - inability: Not on file  . Transportation needs - medical: Not on file  . Transportation needs - non-medical: Not on file  Occupational History  . Occupation: Lobbyist: [place]    Comment: [employer]  Tobacco Use  . Smoking status: Never Smoker  . Smokeless tobacco: Never Used  Substance and Sexual Activity  . Alcohol use: No  . Drug use: No  . Sexual activity: Not on file  Other Topics Concern  . Not on file  Social History Narrative   Kenneth Pace works for [employer]   Kenneth Pace lives at home with wife.     Highest level of education:  BS, business admin    Current Outpatient Medications on File Prior to Visit  Medication Sig Dispense Refill  . acetaminophen (TYLENOL) 325 MG tablet Take 2 tablets (650 mg total) by mouth every 6 (six) hours as needed for mild pain or headache.    Marland Kitchen amitriptyline (ELAVIL) 10 MG tablet Take 1 tablet (10 mg total) by mouth at bedtime. 30 tablet 1  .  baclofen (LIORESAL) 10 MG tablet Take 1 tablet (10 mg total) by mouth 3 (three) times daily. 90 each 11  . busPIRone (BUSPAR) 30 MG tablet TAKE ONE-HALF (1/2) TABLET (15 MG) TWICE A DAY (Patient taking differently: 30MG BY MOUTH AT BEDTIME) 180 tablet 1  . Continuous Glucose Monitor Sup (ENLITE GLUCOSE SENSOR) MISC 1 Device by Does not apply route every 3 (three) days. 30 each 3  . Cranberry 400 MG TABS Take 2 each by mouth daily.      . Cyanocobalamin (VITAMIN B-12 CR PO) Take 1 each by mouth daily.      . finasteride (PROSCAR) 5 MG tablet Take 5 mg by mouth daily.     Marland Kitchen glucagon 1 MG injection Inject 1 mg into the vein once as needed.  (Patient taking differently: Inject 1 mg once as needed into the vein (severe hypoglycemia). ) 1 each 12  . HUMALOG 100 UNIT/ML injection INJECT 120 UNITS DAILY IN PUMP 100 mL 11  . Insulin Infusion Pump Supplies (PARADIGM RESERVOIR 3ML) MISC 1 Device by Does not apply route every 3 (three) days. 30 each 3  . Insulin Infusion Pump Supplies (QUICK-SET INFUSION 43" 9MM) MISC 1 Device by Does not apply route every 3 (three) days. 30 each 3  . Lancets Misc. (ACCU-CHEK MULTICLIX LANCET DEV) KIT 1 Device by Other route 5 (five) times daily as needed. 5/day dx 250.01 450 each 3  . lidocaine (LIDODERM) 5 % Place 1 patch onto the skin daily. At 7 am and remove at 7 pm. Available over the counter. 30 patch 1  . lisinopril-hydrochlorothiazide (PRINZIDE,ZESTORETIC) 10-12.5 MG tablet TAKE 1 TABLET DAILY 90 tablet 1  . Multiple Vitamins-Minerals (MULTIVITAMIN,TX-MINERALS) tablet Take 1 tablet by mouth daily.       No current facility-administered medications on file prior to visit.     No Known Allergies  Family History  Problem Relation Age of Onset  . Dementia Mother   . Diabetes Mellitus I Mother   . Hypertension Father        44  . Healthy Sister   . Rheum arthritis Brother   . Cancer Neg Hx     BP 122/72 (BP Location: Left Arm, Patient Position: Sitting, Cuff Size: Normal)   Pulse 83   Wt 226 lb 3.2 oz (102.6 kg)   SpO2 97%   BMI 30.68 kg/m    Review of Systems Denies hypoglycemia.     Objective:   Physical Exam VITAL SIGNS:  See vs page GENERAL: no distress Pulses: dorsalis pedis intact bilat.   MSK: no deformity of the feet CV: trace bilat leg edema Skin:  no ulcer on the feet.  normal color and temp on the feet. Neuro: sensation is intact to touch on the feet, but decreased from normal.  Ext: There is bilateral onychomycosis of the toenails.   Lab Results  Component Value Date   HGBA1C 8.9 04/29/2017       Assessment & Plan:  Type 1 DM, with DR: worse Leg weakness:  this could be affecting glycemic control.  If so, we expect a1c to improve.   Patient Instructions  Please come back for a regular physical appointment in 2 months.  Please continue these pump settings: basal rate of 4.0 units/hr 6 AM-10 PM, and 2.5 units/hr 10 PM-6 AM mealtime bolus of 1 unit with each meal.   correction bolus (which some people call "sensitivity," or "insulin sensitivity ratio," or just "isr") of 1 unit for each 50  by which your glucose exceeds 100.  suspend the pump for 1-2 hrs, with activity.  If not, eat a light snack with it.   On this type of insulin schedule, you should eat meals on a regular schedule.  If a meal is missed or significantly delayed, your blood sugar could go low.

## 2017-04-29 NOTE — Procedures (Signed)
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  NCV & EMG Findings: Extensive electrodiagnostic testing of the right upper and lower extremities shows: 1. Right median sensory response shows prolonged latency (R4.5 ms). Right ulnar sensory response shows normal latency and reduced amplitude (3.6 V), which is slightly improved when compared to his previous study on 10/21/2016.  Right radial sensory responses are within normal limits. 2. Right sural and superficial peroneal motor responses are within normal limits. 3. Right ulnar motor response shows normal latency (improved), reduced amplitude (ADM 6.0, FDI 3.5 mV), and conduction velocity slowing across the elbow (A Elbow-B Elbow, R29, R48 m/s).  The right ulnar motor response continues to show temporal dispersion, without conduction block.  Right median motor response is within normal limits.  4. Right peroneal and tibial motor responses are within normal limits.   5. Right tibial H-reflex study shows prolonged latency. 6. In the right upper extremity, active on chronic motor axon loss changes are seen in the right abductor digit minimi and first dorsal interosseous muscles, which was previously seen.  Chronic motor axon loss changes are also present in the pronator teres and biceps muscles. 7. In the right lower extremity, there is no evidence of active or chronic motor axon loss changes.  Impression: 1. 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  When compared to  his previous study on 11/10/2016, there is mild interval improvement.  2. Chronic C6 radiculopathy affecting the right upper extremity, mild in degree electrically.  3. There is no evidence of a sensorimotor polyneuropathy or lumbosacral radiculopathy affecting the right lower extremity.    ___________________________ Narda Amber, DO    Nerve Conduction Studies Anti Sensory Summary Table   Site NR Peak (ms) Norm Peak (ms) P-T Amp (V) Norm P-T Amp  Right Median Anti Sensory (2nd Digit)  Wrist    4.5 <3.8 14.8 >10  Right Radial Anti Sensory (Base 1st Digit)  Wrist    2.8 <2.8 14.8 >10  Right Sup Peroneal Anti Sensory (Ant Lat Mall)  12 cm    4.0 <4.6 3.8 >3  Right Sural Anti Sensory (Lat Mall)  Calf    3.3 <4.6 4.7 >3  Right Ulnar Anti Sensory (5th Digit)  Wrist    3.2 <3.2 3.6 >5   Motor Summary Table   Site NR Onset (ms) Norm Onset (ms) O-P Amp (mV) Norm O-P Amp Site1 Site2 Delta-0 (ms) Dist (cm) Vel (m/s) Norm Vel (m/s)  Right Median Motor (Abd Poll Brev)  Wrist    3.4 <4.0 11.1 >5 Elbow Wrist 6.1 31.0 51 >50  Elbow    9.5  10.7         Right Peroneal Motor (Ext Dig Brev)  Ankle    4.2 <6.0 2.7 >2.5 B Fib Ankle 8.7 36.0 41 >40  B Fib    12.9  2.2  Poplt B Fib 2.3 10.0 43 >40  Poplt    15.2  2.2         Right Peroneal TA Motor (Tib Ant)  Fib Head    4.1 <  4.5 4.2 >3 Poplit Fib Head 1.6 10.0 62 >40  Poplit    5.7  4.1         Right Tibial Motor (Abd Hall Brev)  Ankle    4.3 <6.0 4.3 >4 Knee Ankle 10.2 41.0 40 >40  Knee    14.5  3.4         Right Ulnar Motor (Abd Dig Minimi)  Wrist    2.9 <3.1 6.0 >7 B Elbow Wrist 4.9 25.0 51 >50  B Elbow    7.8  5.2  A Elbow B Elbow 3.4 10.0 29 >50  A Elbow    11.2  5.2         Right Ulnar (FDI) Motor (1st DI)  Wrist    4.1 <4.5 3.5 >7 B Elbow Wrist 4.1 26.0 63 >50  B Elbow    8.2  3.0  A Elbow B Elbow 2.1 10.0 48 >50  A Elbow    10.3  3.0          H Reflex Studies   NR H-Lat (ms) Lat Norm (ms) L-R H-Lat (ms)  Right Tibial  (Gastroc)     40.00 <35    EMG   Side Muscle Ins Act Fibs Psw Fasc Number Recrt Dur Dur. Amp Amp. Poly Poly. Comment  Right AntTibialis Nml Nml Nml Nml Nml Nml Nml Nml Nml Nml Nml Nml N/A  Right BicepsFemS Nml Nml Nml Nml Nml Nml Nml Nml Nml Nml Nml Nml N/A  Right Gastroc Nml Nml Nml Nml Nml Nml Nml Nml Nml Nml Nml Nml N/A  Right Flex Dig Long Nml Nml Nml Nml Nml Nml Nml Nml Nml Nml Nml Nml N/A  Right RectFemoris Nml Nml Nml Nml Nml Nml Nml Nml Nml Nml Nml Nml N/A  Right GluteusMed Nml Nml Nml Nml Nml Nml Nml Nml Nml Nml Nml Nml N/A  Right 1stDorInt Nml 1+ Nml Nml SMU Rapid All 1+ All 1+ All 1+ ATR  Right ABD Dig Min Nml 1+ Nml Nml SMU Rapid All 1+ All 1+ All 1+ N/A  Right Abd Poll Brev Nml Nml Nml Nml Nml Nml Nml Nml Nml Nml Nml Nml N/A  Right Ext Indicis Nml Nml Nml Nml Nml Nml Nml Nml Nml Nml Nml Nml N/A  Right PronatorTeres Nml Nml Nml Nml 1- Rapid Some 1+ Some 1+ Nml Nml N/A  Right Biceps Nml Nml Nml Nml Nml Nml Nml Nml Nml Nml Nml Nml N/A  Right Triceps Nml Nml Nml Nml 1- Rapid Some 1+ Some 1+ Nml Nml N/A  Right Deltoid Nml Nml Nml Nml Nml Nml Nml Nml Nml Nml Nml Nml N/A      Waveforms:

## 2017-04-30 ENCOUNTER — Telehealth: Payer: Self-pay | Admitting: *Deleted

## 2017-04-30 ENCOUNTER — Other Ambulatory Visit: Payer: Self-pay | Admitting: *Deleted

## 2017-04-30 NOTE — Telephone Encounter (Signed)
Patient and his wife were given results and notified that the IVIG will be done on March 20 at 8:00.

## 2017-04-30 NOTE — Telephone Encounter (Signed)
-----   Message from Alda Berthold, DO sent at 04/29/2017  5:32 PM EDT ----- Please inform patient that his nerve testing shows neuropathy in the arms only and when compared to the last test, it is relatively stable, with mild improvement.  There is no neuropathy in the leg.   Let's plan to restart IVIG 1g/kg every 4 weeks - pt prefers infusion center.

## 2017-05-03 DIAGNOSIS — R29898 Other symptoms and signs involving the musculoskeletal system: Secondary | ICD-10-CM | POA: Diagnosis not present

## 2017-05-03 DIAGNOSIS — M542 Cervicalgia: Secondary | ICD-10-CM | POA: Diagnosis not present

## 2017-05-04 ENCOUNTER — Other Ambulatory Visit (HOSPITAL_COMMUNITY): Payer: Self-pay | Admitting: *Deleted

## 2017-05-05 ENCOUNTER — Ambulatory Visit (HOSPITAL_COMMUNITY)
Admission: RE | Admit: 2017-05-05 | Discharge: 2017-05-05 | Disposition: A | Discharge status: Home / Self Care | Payer: BLUE CROSS/BLUE SHIELD | Source: Ambulatory Visit | Attending: Neurology | Admitting: Neurology

## 2017-05-05 DIAGNOSIS — G6181 Chronic inflammatory demyelinating polyneuritis: Secondary | ICD-10-CM | POA: Insufficient documentation

## 2017-05-05 MED ORDER — IMMUNE GLOBULIN (HUMAN) 5 GM/50ML IV SOLN
1.0000 g/kg | INTRAVENOUS | Status: DC
  Administered 2017-05-05: 105 g via INTRAVENOUS
  Filled 2017-05-05: qty 50

## 2017-05-06 DIAGNOSIS — Z6829 Body mass index (BMI) 29.0-29.9, adult: Secondary | ICD-10-CM | POA: Diagnosis not present

## 2017-05-06 DIAGNOSIS — R030 Elevated blood-pressure reading, without diagnosis of hypertension: Secondary | ICD-10-CM | POA: Diagnosis not present

## 2017-05-06 DIAGNOSIS — M4802 Spinal stenosis, cervical region: Secondary | ICD-10-CM | POA: Diagnosis not present

## 2017-05-10 DIAGNOSIS — R29898 Other symptoms and signs involving the musculoskeletal system: Secondary | ICD-10-CM | POA: Diagnosis not present

## 2017-05-10 DIAGNOSIS — M542 Cervicalgia: Secondary | ICD-10-CM | POA: Diagnosis not present

## 2017-05-12 DIAGNOSIS — M542 Cervicalgia: Secondary | ICD-10-CM | POA: Diagnosis not present

## 2017-05-12 DIAGNOSIS — G378 Other specified demyelinating diseases of central nervous system: Secondary | ICD-10-CM | POA: Diagnosis not present

## 2017-05-12 DIAGNOSIS — R29898 Other symptoms and signs involving the musculoskeletal system: Secondary | ICD-10-CM | POA: Diagnosis not present

## 2017-06-02 ENCOUNTER — Encounter: Payer: Self-pay | Admitting: Physical Medicine & Rehabilitation

## 2017-06-02 ENCOUNTER — Encounter
Payer: BLUE CROSS/BLUE SHIELD | Attending: Physical Medicine & Rehabilitation | Admitting: Physical Medicine & Rehabilitation

## 2017-06-02 VITALS — BP 126/75 | HR 82 | Resp 14 | Ht 72.0 in | Wt 229.0 lb

## 2017-06-02 DIAGNOSIS — N319 Neuromuscular dysfunction of bladder, unspecified: Secondary | ICD-10-CM | POA: Diagnosis not present

## 2017-06-02 DIAGNOSIS — R531 Weakness: Secondary | ICD-10-CM | POA: Insufficient documentation

## 2017-06-02 DIAGNOSIS — F411 Generalized anxiety disorder: Secondary | ICD-10-CM | POA: Diagnosis not present

## 2017-06-02 DIAGNOSIS — Z820 Family history of epilepsy and other diseases of the nervous system: Secondary | ICD-10-CM | POA: Diagnosis not present

## 2017-06-02 DIAGNOSIS — N400 Enlarged prostate without lower urinary tract symptoms: Secondary | ICD-10-CM | POA: Diagnosis not present

## 2017-06-02 DIAGNOSIS — G959 Disease of spinal cord, unspecified: Secondary | ICD-10-CM | POA: Insufficient documentation

## 2017-06-02 DIAGNOSIS — Z8261 Family history of arthritis: Secondary | ICD-10-CM | POA: Insufficient documentation

## 2017-06-02 DIAGNOSIS — M792 Neuralgia and neuritis, unspecified: Secondary | ICD-10-CM | POA: Diagnosis not present

## 2017-06-02 DIAGNOSIS — Z981 Arthrodesis status: Secondary | ICD-10-CM | POA: Diagnosis not present

## 2017-06-02 DIAGNOSIS — E109 Type 1 diabetes mellitus without complications: Secondary | ICD-10-CM | POA: Insufficient documentation

## 2017-06-02 DIAGNOSIS — I10 Essential (primary) hypertension: Secondary | ICD-10-CM | POA: Diagnosis not present

## 2017-06-02 DIAGNOSIS — M4712 Other spondylosis with myelopathy, cervical region: Secondary | ICD-10-CM

## 2017-06-02 DIAGNOSIS — Z8249 Family history of ischemic heart disease and other diseases of the circulatory system: Secondary | ICD-10-CM | POA: Diagnosis not present

## 2017-06-02 DIAGNOSIS — G610 Guillain-Barre syndrome: Secondary | ICD-10-CM | POA: Diagnosis not present

## 2017-06-02 DIAGNOSIS — E785 Hyperlipidemia, unspecified: Secondary | ICD-10-CM | POA: Insufficient documentation

## 2017-06-02 DIAGNOSIS — Z9889 Other specified postprocedural states: Secondary | ICD-10-CM | POA: Insufficient documentation

## 2017-06-02 DIAGNOSIS — Z9641 Presence of insulin pump (external) (internal): Secondary | ICD-10-CM | POA: Diagnosis not present

## 2017-06-02 DIAGNOSIS — M4802 Spinal stenosis, cervical region: Secondary | ICD-10-CM | POA: Insufficient documentation

## 2017-06-02 DIAGNOSIS — Z833 Family history of diabetes mellitus: Secondary | ICD-10-CM | POA: Insufficient documentation

## 2017-06-02 DIAGNOSIS — R269 Unspecified abnormalities of gait and mobility: Secondary | ICD-10-CM | POA: Diagnosis not present

## 2017-06-02 DIAGNOSIS — N529 Male erectile dysfunction, unspecified: Secondary | ICD-10-CM | POA: Insufficient documentation

## 2017-06-02 DIAGNOSIS — G4733 Obstructive sleep apnea (adult) (pediatric): Secondary | ICD-10-CM | POA: Diagnosis not present

## 2017-06-02 MED ORDER — AMITRIPTYLINE HCL 25 MG PO TABS: 25.0000 mg | ORAL_TABLET | Freq: Every day | ORAL | 1 refills | Status: DC

## 2017-06-02 MED ORDER — DULOXETINE HCL 30 MG PO CPEP: 30.0000 mg | ORAL_CAPSULE | Freq: Every day | ORAL | 1 refills | Status: DC

## 2017-06-02 NOTE — Progress Notes (Signed)
Subjective:    Patient ID: Kenneth Pace, male    DOB: 08/27/1950, 67 y.o.   MRN: [id number]  HPI 67 y.o. 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   Last clinic visit 04/27/17.  Since that time, pt states he completed therapies.  He had a NCS/EMG recently, results reviewed.  Wife present, who provides much of history.  He has returned to work, wife with concerns about stability after to return from work.  Wife reports fall, patient denies.  He reports issues in the morning and the evenings.  He continues to have dysesthesias, which have improved with compression. He is doing okay at work.  Elavil appears to be helping.  He weaned Keppra. He restarted IVIG 1/month for 6 months.    Pain Inventory Average Pain 3 Pain Right Now 3 My pain is dull and tingling  In the last 24 hours, has pain interfered with the following? General activity 3 Relation with others 1 Enjoyment of life 4 What TIME of day is your pain at its worst? morning, evening Pain is worse with: other Pain improves with: rest, heat/ice, medication and TENS Relief from Meds: 5  Mobility walk with assistance use a cane ability to climb steps?  yes do you drive?  yes Do you have any goals in this area?  yes  Function employed # of hrs/week 40+  Neuro/Psych numbness tingling trouble walking  Prior Studies Any changes since last visit?  no  Physicians involved in your care Any changes since last visit?  no   Family History  Problem Relation Age of Onset  . Dementia Mother   . Diabetes Mellitus I Mother   . Hypertension Father        23  . Healthy Sister   . Rheum arthritis Brother   . Cancer Neg Hx    Social History   Socioeconomic History  . Marital status: Married    Spouse name: Not on file  . Number of children: Not on file  . Years of  education: Not on file  . Highest education level: Not on file  Occupational History  . 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 Financial resource strain: Not on file  . Food insecurity:    Worry: Not on file    Inability: Not on file  . Transportation needs:    Medical: Not on file    Non-medical: Not on file  Tobacco Use  . Smoking status: Never Smoker  . Smokeless tobacco: Never Used  Substance and Sexual Activity  . Alcohol use: No  . Drug use: No  . Sexual activity: Not on file  Lifestyle  . Physical activity:    Days per week: Not on file    Minutes per session: Not on file  . Stress: Not on file  Relationships  . Social connections:    Talks on phone: Not on file    Gets together: Not on file    Attends religious service: Not on file    Active member of club or organization: Not on file    Attends meetings of clubs or organizations: Not on file    Relationship status: Not on file  Other Topics Concern  . Not on file  Social History Narrative   He works for [employer] [place]   He lives at home with wife.     Highest level of education:  BS, business admin   Past Surgical History:  Procedure Laterality Date  . ANTERIOR CERVICAL DECOMP/DISCECTOMY FUSION N/A 02/01/2017   Procedure: ANTERIOR CERVICAL DECOMPRESSION/DISCECTOMY FUSION CERVICAL THREE-FOUR , CERVICAL FOUR-FIVE  CERVICAL FIVE-SIX;  Surgeon: Consuella Lose, MD;  Location: [facility];  Service: Neurosurgery;  Laterality: N/A;  . KNEE SURGERY     2 Lknee arthroscopy, 3 R knee arthroscpoy  . KNEE SURGERY Right 11/12/2016  . TONSILLECTOMY     Past Medical History:  Diagnosis Date  . ADENOIDECTOMY, HX OF 02/16/2007  . Anxiety    pt. states he does not have anxiety.  Marland Kitchen BENIGN PROSTATIC HYPERTROPHY, WITH OBSTRUCTION 02/03/2010  . DIABETES MELLITUS, TYPE I, UNCONTROLLED 12/29/2006  . ERECTILE DYSFUNCTION 02/16/2007  .  HYPERLIPIDEMIA 02/16/2007  . HYPERTENSION 02/16/2007  . TESTICULAR MASS, LEFT 02/16/2007   BP 126/75 (BP Location: Right Arm, Patient Position: Sitting, Cuff Size: Normal)   Pulse 82   Resp 14   Ht 6' (1.829 m)   Wt 229 lb (103.9 kg)   SpO2 95%   BMI 31.06 kg/m   Opioid Risk Score:   Fall Risk Score:  `1  Depression screen PHQ 2/9  Depression screen [facility] 2/9 02/26/2017 02/26/2017  Decreased Interest 0 0  Down, Depressed, Hopeless 0 0  PHQ - 2 Score 0 0  Altered sleeping 1 -  Tired, decreased energy 0 -  Change in appetite 0 -  Feeling bad or failure about yourself  0 -  Trouble concentrating 0 -  Moving slowly or fidgety/restless 1 -  Suicidal thoughts 0 -  PHQ-9 Score 2 -  Difficult doing work/chores Not difficult at all -   Review of Systems  HENT: Negative.   Eyes: Negative.   Respiratory: Positive for apnea.   Cardiovascular: Positive for leg swelling.  Gastrointestinal: Negative.   Endocrine: Negative.   Genitourinary: Negative.   Musculoskeletal: Positive for arthralgias, gait problem, myalgias and neck pain.  Skin: Negative.   Allergic/Immunologic: Negative.   Neurological: Positive for numbness.       Tingling   Hematological: Negative.   Psychiatric/Behavioral: Negative.   All other systems reviewed and are negative.     Objective:   Physical Exam Constitutional: He appears well-developed and well-nourished. No distress.  HENT: Normocephalic and atraumatic.  Eyes: EOM are normal. No discharge.  Neck: Right neck with honey comb dressing and edema, improving Cardiovascular: RRR. No JVD.  Respiratory: Effort normal and breath sounds normal.  GI: He exhibits no distention. BS+  Musculoskeletal: He exhibits no edema or tenderness in extremities.  Neurological: He is alert and oriented.  Motor: B/L UE 5/5 proximal to distal  B/l LE: HF 5/5, KE, ADF/PF 5/5 Sensation to light touch improving Skin: Skin is warm and dry. No rash noted. He is not  diaphoretic. No erythema.  Psychiatric: He has a normal mood and affect. His behavior is normal. Judgment and thought content normal.     Assessment & Plan:  67 y.o. 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   1.  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  Cont HEP, encouraged waiting for restarting therapies  Cont follow up Neurosurg  Discussed return to sedentary work, pt tolerating  2.  Pain Management:   Gabapentin and Lyrica d/ced due to edema  Cont Lidoderm patch  Cont TENS  Will increase Elavil to 20 qhs  Will order Cymbalta 30mg  with food  Cont Baclofen per Neurology  3. Gait abnormality  Cont HEP, will consider reodering in future  Cont cane/walker for safety  4. Mechanical low back  Cont Balcofen per PCP  Will order Cymbalta

## 2017-06-03 ENCOUNTER — Ambulatory Visit (HOSPITAL_COMMUNITY)
Admission: RE | Admit: 2017-06-03 | Discharge: 2017-06-03 | Disposition: A | Discharge status: Home / Self Care | Payer: BLUE CROSS/BLUE SHIELD | Source: Ambulatory Visit | Attending: Neurology | Admitting: Neurology

## 2017-06-03 DIAGNOSIS — G6181 Chronic inflammatory demyelinating polyneuritis: Secondary | ICD-10-CM | POA: Insufficient documentation

## 2017-06-03 MED ORDER — IMMUNE GLOBULIN (HUMAN) 5 GM/50ML IV SOLN
1.0000 g/kg | INTRAVENOUS | Status: DC
  Administered 2017-06-03: 105 g via INTRAVENOUS
  Filled 2017-06-03: qty 50

## 2017-06-08 ENCOUNTER — Telehealth: Payer: Self-pay | Admitting: Neurology

## 2017-06-08 NOTE — Telephone Encounter (Signed)
Almyra Free called with Rickey Primus needing to have patient's last office notes faxed to [phone]. She said he has completed the IVIG and these notes are to see where the patient is at this point and Plan of Care. Thanks

## 2017-06-08 NOTE — Telephone Encounter (Signed)
Note faxed.

## 2017-06-11 DIAGNOSIS — G4733 Obstructive sleep apnea (adult) (pediatric): Secondary | ICD-10-CM | POA: Diagnosis not present

## 2017-06-12 DIAGNOSIS — G378 Other specified demyelinating diseases of central nervous system: Secondary | ICD-10-CM | POA: Diagnosis not present

## 2017-06-15 ENCOUNTER — Ambulatory Visit: Payer: BLUE CROSS/BLUE SHIELD | Admitting: Neurology

## 2017-06-15 ENCOUNTER — Encounter: Payer: Self-pay | Admitting: Neurology

## 2017-06-15 VITALS — BP 124/70 | HR 108 | Ht 72.0 in | Wt 229.2 lb

## 2017-06-15 DIAGNOSIS — B351 Tinea unguium: Secondary | ICD-10-CM | POA: Diagnosis not present

## 2017-06-15 DIAGNOSIS — G959 Disease of spinal cord, unspecified: Secondary | ICD-10-CM | POA: Diagnosis not present

## 2017-06-15 DIAGNOSIS — L03032 Cellulitis of left toe: Secondary | ICD-10-CM | POA: Diagnosis not present

## 2017-06-15 DIAGNOSIS — G6181 Chronic inflammatory demyelinating polyneuritis: Secondary | ICD-10-CM | POA: Diagnosis not present

## 2017-06-15 DIAGNOSIS — L03031 Cellulitis of right toe: Secondary | ICD-10-CM | POA: Diagnosis not present

## 2017-06-15 NOTE — Progress Notes (Signed)
Follow-up Visit   Date: 06/15/17   Kenneth Pace MRN: [id number] DOB: 1950-05-11   Interim History: Kenneth Pace is a 67 y.o. right-handed Caucasian male with insulin-dependent diabetes mellitus, hypertension, OSA on CPAP, hyperlipidemia, anxiety, and cervical myelopathy s/p decompression at C3-C6 returning to the clinic for follow-up of CIDP  The patient was accompanied to the clinic by wife who also provides collateral information.    History of present illness: Around late August, he started having electrical impulses of the arms, which started in the neck and radiates into the palm. He suffered a fall prior to this so he felt the he may have injured his neck.  A few weeks later, he also noticed weakness of the hands and unable to type at work.  In mid-September, he began having gait imbalance and started falling frequently. Since then, he has numbness/tingling of the feet and intermittent sharp pain radiating from his back into his mid-chest.  He has weakness of the both legs and has difficulty standing, climbing steps, and walking.  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 He does not have any difficulty swallowing, talking, or shortness of breath.   His testing showed albuminocytologic disocciation with CSF protein 81 and normal cell count.   He completed 5-days IVIG with the last dose on 10/9 and felt some improvement in the hands, but legs were weaker.  In the fall 2019, he developed progressive leg weakness and was found to have myelopathic findings.  Imaging of the cervical spine showed moderate multilevel cervical stenosis.  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 Kathyrn Sheriff on 02/01/2017 which has significantly improved his gait and lower extremity strength.  He has been seeing Dr. Delice Lesch, PM&R, for rehab and pain management and getting out-patient physical therapy.   He continues to have numbness and tingling of the feet, as well as significant leg stiffness and imbalance.     UPDATE 06/15/2017:  He is here for follow-up visit.  Since starting baclofen, his cramps and gait has improved.  He no longer uses a walker and tends to use a cane most of the time, and challenges himself to walk unassisted, when he feels safe. He suffered one fall while doing things in his yard.  Pain is better controlled on amitriptyline 51m at bedtime, which is managed by Dr. ADelice Lesch  He is tolerating IVIG infusions but tends to get fatigued for 1-2 weeks following the infusion.  He continues to have right hand weakness and numbness.  He has returned to work and able to keep up with all his activities and job responsibilities.    Medications:  Current Outpatient Medications on File Prior to Visit  Medication Sig Dispense Refill  . acetaminophen (TYLENOL) 325 MG tablet Take 2 tablets (650 mg total) by mouth every 6 (six) hours as needed for mild pain or headache.    .Marland Kitchenamitriptyline (ELAVIL) 25 MG tablet Take 1 tablet (25 mg total) by mouth at bedtime. 30 tablet 1  . baclofen (LIORESAL) 10 MG tablet Take 1 tablet (10 mg total) by mouth 3 (three) times daily. 90 each 11  . busPIRone (BUSPAR) 30 MG tablet TAKE ONE-HALF (1/2) TABLET (15 MG) TWICE A DAY (Patient taking differently:  30MG BY MOUTH AT BEDTIME) 180 tablet 1  . Continuous Glucose Monitor Sup (ENLITE GLUCOSE SENSOR) MISC 1 Device by Does not apply route every 3 (three) days. 30 each 3  . Cranberry 400 MG TABS Take 2 each by mouth daily.      . Cyanocobalamin (VITAMIN B-12 CR PO) Take 1 each by mouth daily.      . DULoxetine (CYMBALTA) 30 MG capsule Take 1 capsule (30 mg total) by mouth daily. 30 capsule 1  .  finasteride (PROSCAR) 5 MG tablet Take 5 mg by mouth daily.     Marland Kitchen glucagon 1 MG injection Inject 1 mg into the vein once as needed. (Patient taking differently: Inject 1 mg once as needed into the vein (severe hypoglycemia). ) 1 each 12  . glucose blood (BAYER CONTOUR NEXT TEST) test strip Use to check blood sugar 5 times per day dx 250.01, 500 each 3  . HUMALOG 100 UNIT/ML injection INJECT 120 UNITS DAILY IN PUMP 100 mL 11  . Insulin Infusion Pump Supplies (PARADIGM RESERVOIR 3ML) MISC 1 Device by Does not apply route every 3 (three) days. 30 each 3  . Insulin Infusion Pump Supplies (QUICK-SET INFUSION 43" 9MM) MISC 1 Device by Does not apply route every 3 (three) days. 30 each 3  . Lancets Misc. (ACCU-CHEK MULTICLIX LANCET DEV) KIT 1 Device by Other route 5 (five) times daily as needed. 5/day dx 250.01 450 each 3  . lidocaine (LIDODERM) 5 % Place 1 patch onto the skin daily. At 7 am and remove at 7 pm. Available over the counter. 30 patch 1  . lisinopril-hydrochlorothiazide (PRINZIDE,ZESTORETIC) 10-12.5 MG tablet TAKE 1 TABLET DAILY 90 tablet 1  . Multiple Vitamin (MULTIVITAMIN) capsule Take by mouth.    . Multiple Vitamins-Minerals (MULTIVITAMIN,TX-MINERALS) tablet Take 1 tablet by mouth daily.       No current facility-administered medications on file prior to visit.     Allergies: No Known Allergies  Review of Systems:  CONSTITUTIONAL: No fevers, chills, night sweats, or weight loss.  EYES: No visual changes or eye pain ENT: No hearing changes.  No history of nose bleeds.   RESPIRATORY: No cough, wheezing and shortness of breath.   CARDIOVASCULAR: Negative for chest pain, and palpitations.   GI: Negative for abdominal discomfort, blood in stools or black stools.  No recent change in bowel habits.   GU:  No history of incontinence.   MUSCLOSKELETAL: No history of joint pain or swelling.  No myalgias.   SKIN: Negative for lesions, rash, and itching.   ENDOCRINE: Negative for cold or  heat intolerance, polydipsia or goiter.   PSYCH:  No depression or anxiety symptoms.   NEURO: As Above.   Vital Signs:  BP 124/70   Pulse (!) 108   Ht 6' (1.829 m)   Wt 229 lb 4 oz (104 kg)   SpO2 97%   BMI 31.09 kg/m   General Medical Exam:   General:  Well appearing, comfortable  Eyes/ENT: see cranial nerve examination.   Neck: No carotid bruits. Respiratory:  Clear to auscultation, good air entry bilaterally.   Cardiac:  Regular rate and rhythm, no murmur.   Ext:  No edema  Neurological Exam: MENTAL STATUS including orientation to time, place, person, recent and remote memory, attention span and concentration, language, and fund of knowledge is normal.  Speech is not dysarthric.  CRANIAL NERVES:  Pupils equal round and reactive to light.  Normal conjugate, extra-ocular eye movements in all directions  of gaze.  No ptosis. Face is symmetric. Palate elevates symmetrically.  Tongue is midline.  MOTOR:  There is moderate right FDI and ADM atrophy.  No fasciculations or abnormal movements.  No pronator drift.    Right Upper Extremity:    Left Upper Extremity:    Deltoid  5/5   Deltoid  5/5   Biceps  5/5   Biceps  5/5   Triceps  5/5   Triceps  5/5   Wrist extensors  5/5   Wrist extensors  5/5   Wrist flexors  5/5   Wrist flexors  5/5   Finger extensors  5/5   Finger extensors  5/5   Finger flexors  5/5   Finger flexors  5/5   Dorsal interossei  4+/5   Dorsal interossei  5/5   Abductor pollicis  5/5   Abductor pollicis  5/5   Tone (Ashworth scale)  0  Tone (Ashworth scale)  0   Right Lower Extremity:    Left Lower Extremity:    Hip flexors  5/5   Hip flexors  5/5   Hip extensors  5/5   Hip extensors  5/5   Knee flexors  5/5   Knee flexors  5/5   Knee extensors  5/5   Knee extensors  5/5   Dorsiflexors  5/5   Dorsiflexors  5/5   Plantarflexors  5/5   Plantarflexors  5/5   Toe extensors  5/5   Toe extensors  5/5   Toe flexors  5/5   Toe flexors  5/5   Tone (Ashworth scale)   0+  Tone (Ashworth scale)  0+    MSRs:  Right                                                                 Left brachioradialis 2+  brachioradialis 2+  biceps 2+  biceps 2+  triceps 2+  triceps 2+  patellar 3+  patellar 3+  ankle jerk 2+  ankle jerk 2+  Hoffman no  Hoffman no  plantar response down  plantar response down   SENSORY:  Vibration is reduced to 90% at the MCP, 100% at the elbow and knees, and 60% distal to ankles.    COORDINATION/GAIT: Gait is mildly spastic, assisted with cane and appears stable (improved)   Data: MRI cervical and lumbar spine 11/20/2016: 1. No focal cord signal abnormality or lesion. 2. Multilevel spondylosis of the cervical spine as described. 3. Moderate central and mild bilateral foraminal stenosis at C3-4. 4. Moderate central and bilateral foraminal narrowing at C4-5. 5. Moderate central and severe bilateral foraminal stenosis at C5-6. 6. Moderate foraminal narrowing bilaterally at C6-7 without significant central canal stenosis. 7. Short pedicles in the cervical and lumbar spine contribute to the stenosis. 8. Disc bulging at L2-3 and L3-4 without significant stenosis. 9. Disc bulging and facet hypertrophy at L4-5 with mild subarticular and foraminal stenosis bilaterally. 10. Mild left foraminal narrowing at L5-S1 secondary to asymmetric facet hypertrophy.  CSF 11/20/2016:  R1 W3 P81* G102*  No OCB, VDRL neg Labs 11/20/2016:  SPEP with IFE no M protein, 486, TSH 1.051  Lab Results  Component Value Date   HGBA1C 8.9 04/29/2017   MRI cervical spine 12/31/2016:   1. No  enlargement of the cranial nerves or cervical nerve roots. 2. No acute intracranial abnormality. 3. Multilevel moderate cervical spinal canal stenosis, worst at C3-4 and C5-6 with associated mild cord deformity but no cord signal change. 4. Moderate to severe neural foraminal stenosis at C3-4, C4-5 and C5-6.  NCS/EMG of the right upper and lower extremities 04/27/2017: 1. The  electrophysiologic findings are most consistent with an active on chronic polyradiculoneuropathy affecting right upper extremity.  When compared to his previous study on 11/10/2016, there is mild interval improvement.  2. Chronic C6 radiculopathy affecting the right upper extremity, mild in degree electrically.  3. There is no evidence of a sensorimotor polyneuropathy or lumbosacral radiculopathy affecting the right lower extremity.    IMPRESSION/PLAN: 1.  Chronic inflammatory demyelinating polyradiculoneuropathy.  Symptom onset in August 2018 with right arm weakness and paresthesias.  He was treated with IVIG x 5 and PLEX with minimal improvement, and then developed new findings of leg weakness and myelopathy secondary to severe cervical canal stenosis. He is doing remarkably better with respect to his weakness since undergoing surgical decompression   Repeat NCS/EMG from March 2019 continue to show findings consistent with polyradiculoneuropathy, with mild interval improvement.  Therefore, he has been continued on IVIG every 3 weeks.  We also discussed exploring options of Hizentra which would provide for greater steady state of the medication and minimize side effects.  We will start the process for medication approval, if he would like to transition to this.   2.  Cervical canal stenosis s/p decompression from C3-6.  Continue baclofen 4m TID.  Encouraged leg stretching, which he is doing.  Continue to use a cane. Appreciate Dr. ADelice Leschfollowing.  Return to clinic in 4 months  Greater than 50% of this 25 minute visit was spent in counseling, explanation of diagnosis, planning of further management, and coordination of care.    Thank you for allowing me to participate in patient's care.  If I can answer any additional questions, I would be pleased to do so.    Sincerely,    Donika K. PPosey Pronto DO

## 2017-06-15 NOTE — Patient Instructions (Addendum)
We will look into your options for Hizentra (subcutaneous IVIG), which is a weekly home infusion that you can be trained to administer yourself.   In the meantime, continue your monthly IVIG infusions  Return to clinic in 4 months

## 2017-06-16 NOTE — Progress Notes (Signed)
PA request faxed

## 2017-06-23 ENCOUNTER — Telehealth: Payer: Self-pay | Admitting: Neurology

## 2017-06-23 NOTE — Telephone Encounter (Signed)
Wells Guiles from [employer] called regarding this patient. She would like to speak with you. Thanks

## 2017-06-23 NOTE — Telephone Encounter (Signed)
Called [facility] back and informed them that I did receive their request for more information and will fax it back tomorrow when I get the form signed.

## 2017-06-30 ENCOUNTER — Ambulatory Visit (INDEPENDENT_AMBULATORY_CARE_PROVIDER_SITE_OTHER): Payer: BLUE CROSS/BLUE SHIELD | Admitting: Endocrinology

## 2017-06-30 ENCOUNTER — Encounter: Payer: Self-pay | Admitting: Endocrinology

## 2017-06-30 VITALS — BP 130/80 | HR 86 | Ht 72.0 in | Wt 234.0 lb

## 2017-06-30 DIAGNOSIS — E119 Type 2 diabetes mellitus without complications: Secondary | ICD-10-CM | POA: Diagnosis not present

## 2017-06-30 LAB — POCT GLYCOSYLATED HEMOGLOBIN (HGB A1C): HEMOGLOBIN A1C: 8.2

## 2017-06-30 NOTE — Patient Instructions (Addendum)
Please come back for a regular physical appointment in 2 months.  Please continue these pump settings: basal rate of 4.1 units/hr 6 AM-10 PM, and 2.6 units/hr 10 PM-6 AM mealtime bolus of 1 unit with each meal.   correction bolus (which some people call "sensitivity," or "insulin sensitivity ratio," or just "isr") of 1 unit for each 50 by which your glucose exceeds 100.  suspend the pump for 1-2 hrs, with activity.  If not, eat a light snack with it.   On this type of insulin schedule, you should eat meals on a regular schedule.  If a meal is missed or significantly delayed, your blood sugar could go low.

## 2017-06-30 NOTE — Progress Notes (Signed)
Subjective:    Patient ID: Kenneth Pace, male    DOB: 02/21/1950, 67 y.o.   MRN: [id number]  HPI Pt returns for f/u of diabetes mellitus:  DM type: 1 Dx'ed: 6378 Complications: retinopathy and polyneuropathy.   Therapy: insulin since dx.  DKA: only at dx.   Severe hypoglycemia: most recently in 2014.  Pancreatitis: never Other: he has a medtronic pump and continuous glucose monitor; he is still on short-term disability.  continuous glucose monitor ID is [email or web address] and password is [email or web address].  Interval history:   He takes these pump settings:  basal rate of 4.0 units/hr 6 AM-10 PM, and 2.5 units/hr 10 PM-6 AM.   mealtime bolus of 1 unit with each meal.  continue correction bolus (which some people call "sensitivity," or "insulin sensitivity ratio," or just "isr") of 1 unit for each 50 by which glucose exceeds 100.  suspends the pump for 1-2 hrs, with activity.  If not, eat a light snack with it.   TDD is 92 units.   We reviewed continuous glucose monitor data together.  Glucose varies from 50-270.  There is no trend throughout the day. Past Medical History:  Diagnosis Date  . ADENOIDECTOMY, HX OF 02/16/2007  . Anxiety    pt. states he does not have anxiety.  Marland Kitchen BENIGN PROSTATIC HYPERTROPHY, WITH OBSTRUCTION 02/03/2010  . DIABETES MELLITUS, TYPE I, UNCONTROLLED 12/29/2006  . ERECTILE DYSFUNCTION 02/16/2007  . HYPERLIPIDEMIA 02/16/2007  . HYPERTENSION 02/16/2007  . TESTICULAR MASS, LEFT 02/16/2007    Past Surgical History:  Procedure Laterality Date  . ANTERIOR CERVICAL DECOMP/DISCECTOMY FUSION N/A 02/01/2017   Procedure: ANTERIOR CERVICAL DECOMPRESSION/DISCECTOMY FUSION CERVICAL THREE-FOUR , CERVICAL FOUR-FIVE  CERVICAL FIVE-SIX;  Surgeon: Consuella Lose, MD;  Location: [facility];  Service: Neurosurgery;  Laterality: N/A;  . KNEE SURGERY     2 Lknee arthroscopy, 3 R knee arthroscpoy  . KNEE SURGERY Right 11/12/2016  . TONSILLECTOMY      Social History    Socioeconomic History  . Marital status: Married    Spouse name: Not on file  . Number of children: Not on file  . Years of education: Not on file  . Highest education level: Not on file  Occupational History  . Occupation: Lobbyist: [employer]    Comment: Retail buyer  Social Needs  . Financial resource strain: Not on file  . Food insecurity:    Worry: Not on file    Inability: Not on file  . Transportation needs:    Medical: Not on file    Non-medical: Not on file  Tobacco Use  . Smoking status: Never Smoker  . Smokeless tobacco: Never Used  Substance and Sexual Activity  . Alcohol use: No  . Drug use: No  . Sexual activity: Not on file  Lifestyle  . Physical activity:    Days per week: Not on file    Minutes per session: Not on file  . Stress: Not on file  Relationships  . Social connections:    Talks on phone: Not on file    Gets together: Not on file    Attends religious service: Not on file    Active member of club or organization: Not on file    Attends meetings of clubs or organizations: Not on file    Relationship status: Not on file  . Intimate partner violence:    Fear of current or ex partner: Not on file    Emotionally abused:  Not on file    Physically abused: Not on file    Forced sexual activity: Not on file  Other Topics Concern  . Not on file  Social History Narrative   He works for [employer]   He lives at home with wife.     Highest level of education:  BS, business admin    Current Outpatient Medications on File Prior to Visit  Medication Sig Dispense Refill  . acetaminophen (TYLENOL) 325 MG tablet Take 2 tablets (650 mg total) by mouth every 6 (six) hours as needed for mild pain or headache.    Marland Kitchen amitriptyline (ELAVIL) 25 MG tablet Take 1 tablet (25 mg total) by mouth at bedtime. 30 tablet 1  . baclofen (LIORESAL) 10 MG tablet Take 1 tablet (10 mg total) by mouth 3 (three) times daily. 90  each 11  . busPIRone (BUSPAR) 30 MG tablet TAKE ONE-HALF (1/2) TABLET (15 MG) TWICE A DAY 180 tablet 1  . Continuous Glucose Monitor Sup (ENLITE GLUCOSE SENSOR) MISC 1 Device by Does not apply route every 3 (three) days. 30 each 3  . Cranberry 400 MG TABS Take 2 each by mouth daily.      . Cyanocobalamin (VITAMIN B-12 CR PO) Take 1 each by mouth daily.      . DULoxetine (CYMBALTA) 30 MG capsule Take 1 capsule (30 mg total) by mouth daily. 30 capsule 1  . finasteride (PROSCAR) 5 MG tablet Take 5 mg by mouth daily.     Marland Kitchen glucagon 1 MG injection Inject 1 mg into the vein once as needed. 1 each 12  . glucose blood (BAYER CONTOUR NEXT TEST) test strip Use to check blood sugar 5 times per day dx 250.01, 500 each 3  . HUMALOG 100 UNIT/ML injection INJECT 120 UNITS DAILY IN PUMP 100 mL 11  . Insulin Infusion Pump Supplies (PARADIGM RESERVOIR 3ML) MISC 1 Device by Does not apply route every 3 (three) days. 30 each 3  . Insulin Infusion Pump Supplies (QUICK-SET INFUSION 43" 9MM) MISC 1 Device by Does not apply route every 3 (three) days. 30 each 3  . Lancets Misc. (ACCU-CHEK MULTICLIX LANCET DEV) KIT 1 Device by Other route 5 (five) times daily as needed. 5/day dx 250.01 450 each 3  . lidocaine (LIDODERM) 5 % Place 1 patch onto the skin daily. At 7 am and remove at 7 pm. Available over the counter. 30 patch 1  . lisinopril-hydrochlorothiazide (PRINZIDE,ZESTORETIC) 10-12.5 MG tablet TAKE 1 TABLET DAILY 90 tablet 1  . Multiple Vitamin (MULTIVITAMIN) capsule Take by mouth.    . Multiple Vitamins-Minerals (MULTIVITAMIN,TX-MINERALS) tablet Take 1 tablet by mouth daily.       No current facility-administered medications on file prior to visit.     No Known Allergies  Family History  Problem Relation Age of Onset  . Dementia Mother   . Diabetes Mellitus I Mother   . Hypertension Father        28  . Healthy Sister   . Rheum arthritis Brother   . Cancer Neg Hx     BP 130/80 (BP Location: Right Arm,  Patient Position: Sitting, Cuff Size: Normal)   Pulse 86   Ht 6' (1.829 m)   Wt 234 lb (106.1 kg)   SpO2 97%   BMI 31.74 kg/m    Review of Systems Denies LOC    Objective:   Physical Exam VITAL SIGNS:  See vs page GENERAL: no distress Pulses: dorsalis  pedis intact bilat.   MSK: no deformity of the feet CV: trace bilat leg edema Skin:  no ulcer on the feet.  normal color and temp on the feet. Neuro: sensation is intact to touch on the feet, but decreased from normal.  Ext: There is bilateral onychomycosis of the toenails.    Lab Results  Component Value Date   HGBA1C 8.2 06/30/2017      Assessment & Plan:  Type 1 DM, with DR: he needs increased rx.    Patient Instructions  Please come back for a regular physical appointment in 2 months.  Please continue these pump settings: basal rate of 4.1 units/hr 6 AM-10 PM, and 2.6 units/hr 10 PM-6 AM mealtime bolus of 1 unit with each meal.   correction bolus (which some people call "sensitivity," or "insulin sensitivity ratio," or just "isr") of 1 unit for each 50 by which your glucose exceeds 100.  suspend the pump for 1-2 hrs, with activity.  If not, eat a light snack with it.   On this type of insulin schedule, you should eat meals on a regular schedule.  If a meal is missed or significantly delayed, your blood sugar could go low.

## 2017-07-01 ENCOUNTER — Encounter (HOSPITAL_COMMUNITY): Payer: BLUE CROSS/BLUE SHIELD

## 2017-07-07 ENCOUNTER — Other Ambulatory Visit: Payer: Self-pay

## 2017-07-07 ENCOUNTER — Encounter
Payer: BLUE CROSS/BLUE SHIELD | Attending: Physical Medicine & Rehabilitation | Admitting: Physical Medicine & Rehabilitation

## 2017-07-07 ENCOUNTER — Encounter: Payer: Self-pay | Admitting: Physical Medicine & Rehabilitation

## 2017-07-07 VITALS — BP 142/80 | HR 87 | Ht 72.0 in | Wt 235.8 lb

## 2017-07-07 DIAGNOSIS — Z820 Family history of epilepsy and other diseases of the nervous system: Secondary | ICD-10-CM | POA: Insufficient documentation

## 2017-07-07 DIAGNOSIS — E785 Hyperlipidemia, unspecified: Secondary | ICD-10-CM | POA: Insufficient documentation

## 2017-07-07 DIAGNOSIS — M4712 Other spondylosis with myelopathy, cervical region: Secondary | ICD-10-CM | POA: Diagnosis not present

## 2017-07-07 DIAGNOSIS — R269 Unspecified abnormalities of gait and mobility: Secondary | ICD-10-CM | POA: Insufficient documentation

## 2017-07-07 DIAGNOSIS — N319 Neuromuscular dysfunction of bladder, unspecified: Secondary | ICD-10-CM | POA: Insufficient documentation

## 2017-07-07 DIAGNOSIS — E109 Type 1 diabetes mellitus without complications: Secondary | ICD-10-CM | POA: Insufficient documentation

## 2017-07-07 DIAGNOSIS — Z8261 Family history of arthritis: Secondary | ICD-10-CM | POA: Diagnosis not present

## 2017-07-07 DIAGNOSIS — Z833 Family history of diabetes mellitus: Secondary | ICD-10-CM | POA: Insufficient documentation

## 2017-07-07 DIAGNOSIS — N529 Male erectile dysfunction, unspecified: Secondary | ICD-10-CM | POA: Insufficient documentation

## 2017-07-07 DIAGNOSIS — G610 Guillain-Barre syndrome: Secondary | ICD-10-CM

## 2017-07-07 DIAGNOSIS — I10 Essential (primary) hypertension: Secondary | ICD-10-CM | POA: Diagnosis not present

## 2017-07-07 DIAGNOSIS — Z9889 Other specified postprocedural states: Secondary | ICD-10-CM | POA: Insufficient documentation

## 2017-07-07 DIAGNOSIS — F411 Generalized anxiety disorder: Secondary | ICD-10-CM | POA: Diagnosis not present

## 2017-07-07 DIAGNOSIS — G8918 Other acute postprocedural pain: Secondary | ICD-10-CM

## 2017-07-07 DIAGNOSIS — R531 Weakness: Secondary | ICD-10-CM | POA: Diagnosis not present

## 2017-07-07 DIAGNOSIS — M792 Neuralgia and neuritis, unspecified: Secondary | ICD-10-CM | POA: Diagnosis not present

## 2017-07-07 DIAGNOSIS — G959 Disease of spinal cord, unspecified: Secondary | ICD-10-CM | POA: Diagnosis not present

## 2017-07-07 DIAGNOSIS — Z8249 Family history of ischemic heart disease and other diseases of the circulatory system: Secondary | ICD-10-CM | POA: Diagnosis not present

## 2017-07-07 DIAGNOSIS — Z9641 Presence of insulin pump (external) (internal): Secondary | ICD-10-CM | POA: Insufficient documentation

## 2017-07-07 DIAGNOSIS — M4802 Spinal stenosis, cervical region: Secondary | ICD-10-CM | POA: Diagnosis not present

## 2017-07-07 DIAGNOSIS — G4733 Obstructive sleep apnea (adult) (pediatric): Secondary | ICD-10-CM | POA: Insufficient documentation

## 2017-07-07 DIAGNOSIS — N400 Enlarged prostate without lower urinary tract symptoms: Secondary | ICD-10-CM | POA: Insufficient documentation

## 2017-07-07 DIAGNOSIS — Z981 Arthrodesis status: Secondary | ICD-10-CM | POA: Diagnosis not present

## 2017-07-07 MED ORDER — DULOXETINE HCL 60 MG PO CPEP: 60.0000 mg | ORAL_CAPSULE | Freq: Every day | ORAL | 1 refills | Status: DC

## 2017-07-07 MED ORDER — AMITRIPTYLINE HCL 25 MG PO TABS: 25.0000 mg | ORAL_TABLET | Freq: Every day | ORAL | 1 refills | Status: DC

## 2017-07-07 NOTE — Progress Notes (Signed)
Subjective:    Patient ID: Kenneth Pace, male    DOB: 1950/06/09, 67 y.o.   MRN: [id number]  HPI 67 y.o. 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   Last clinic visit 04/27/17.  Since that time, pt states he is doing HEP.  He saw Neurology and is getting IVIG monthly. He tolerating return to work.  He continues to use lidoderm patch and TENS. He had increase in Elavil with benefit.  He is tolerating Cymbalta as well. Continues to use cane without benefit. He continues to have back pain.   Pain Inventory Average Pain 3 Pain Right Now 4 My pain is dull and tingling  In the last 24 hours, has pain interfered with the following? General activity 4 Relation with others 2 Enjoyment of life 3 What TIME of day is your pain at its worst? morning, evening Pain is worse with: other Pain improves with: rest, heat/ice, medication and TENS Relief from Meds: 5  Mobility walk with assistance use a cane ability to climb steps?  yes do you drive?  yes Do you have any goals in this area?  yes  Function employed # of hrs/week 40+  Neuro/Psych numbness tingling trouble walking  Prior Studies Any changes since last visit?  no  Physicians involved in your care Any changes since last visit?  no   Family History  Problem Relation Age of Onset  . Dementia Mother   . Diabetes Mellitus I Mother   . Hypertension Father        39  . Healthy Sister   . Rheum arthritis Brother   . Cancer Neg Hx    Social History   Socioeconomic History  . Marital status: Married    Spouse name: Not on file  . Number of children: Not on file  . Years of education: Not on file  . Highest education level: Not on file  Occupational History  . 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  Financial resource strain: Not on file  . Food insecurity:    Worry: Not on file    Inability: Not on file  . Transportation needs:    Medical: Not on file    Non-medical: Not on file  Tobacco Use  . Smoking status: Never Smoker  . Smokeless tobacco: Never Used  Substance and Sexual Activity  . Alcohol use: No  . Drug use: No  . Sexual activity: Not on file  Lifestyle  . Physical activity:    Days per week: Not on file    Minutes per session: Not on file  . Stress: Not on file  Relationships  . Social connections:    Talks on phone: Not on file    Gets together: Not on file    Attends religious service: Not on file    Active member of club or organization: Not on file    Attends meetings of clubs or organizations: Not on file    Relationship status: Not on file  Other Topics Concern  . Not on file  Social History Narrative   He works for [employer]   He lives at home with wife.     Highest level of education:  BS, business admin   Past Surgical History:  Procedure Laterality  Date  . ANTERIOR CERVICAL DECOMP/DISCECTOMY FUSION N/A 02/01/2017   Procedure: ANTERIOR CERVICAL DECOMPRESSION/DISCECTOMY FUSION CERVICAL THREE-FOUR , CERVICAL FOUR-FIVE  CERVICAL FIVE-SIX;  Surgeon: Consuella Lose, MD;  Location: [place];  Service: Neurosurgery;  Laterality: N/A;  . KNEE SURGERY     2 Lknee arthroscopy, 3 R knee arthroscpoy  . KNEE SURGERY Right 11/12/2016  . TONSILLECTOMY     Past Medical History:  Diagnosis Date  . ADENOIDECTOMY, HX OF 02/16/2007  . Anxiety    pt. states he does not have anxiety.  Marland Kitchen BENIGN PROSTATIC HYPERTROPHY, WITH OBSTRUCTION 02/03/2010  . DIABETES MELLITUS, TYPE I, UNCONTROLLED 12/29/2006  . ERECTILE DYSFUNCTION 02/16/2007  . HYPERLIPIDEMIA 02/16/2007  . HYPERTENSION 02/16/2007  . TESTICULAR MASS, LEFT 02/16/2007   BP (!) 142/80   Pulse 87   Ht 6' (1.829 m)   Wt 235 lb 12.8 oz (107 kg)   SpO2 97%   BMI 31.98 kg/m    Opioid Risk Score:   Fall Risk Score:  `1  Depression screen PHQ 2/9  Depression screen [facility] 2/9 07/07/2017 02/26/2017 02/26/2017  Decreased Interest 0 0 0  Down, Depressed, Hopeless 0 0 0  PHQ - 2 Score 0 0 0  Altered sleeping - 1 -  Tired, decreased energy - 0 -  Change in appetite - 0 -  Feeling bad or failure about yourself  - 0 -  Trouble concentrating - 0 -  Moving slowly or fidgety/restless - 1 -  Suicidal thoughts - 0 -  PHQ-9 Score - 2 -  Difficult doing work/chores - Not difficult at all -   Review of Systems  HENT: Negative.   Eyes: Negative.   Respiratory: Positive for apnea.   Cardiovascular: Positive for leg swelling.  Gastrointestinal: Negative.   Endocrine: Negative.   Genitourinary: Negative.   Musculoskeletal: Positive for arthralgias, gait problem, myalgias and neck pain.  Skin: Negative.   Allergic/Immunologic: Negative.   Neurological: Positive for numbness.       Tingling   Hematological: Negative.   Psychiatric/Behavioral: Negative.   All other systems reviewed and are negative.     Objective:   Physical Exam Constitutional: He appears well-developed and well-nourished. No distress.  HENT: Normocephalic and atraumatic.  Eyes: EOM are normal. No discharge.  Neck: Right neck with honey comb dressing and edema, improving Cardiovascular: RRR. No JVD.  Respiratory: Effort normal and breath sounds normal.  GI: He exhibits no distention. BS+  Musculoskeletal: He exhibits no edema or tenderness in extremities.  +TTP R>L lumbar PSP Neurological: He is alert and oriented.  Motor: B/L UE 5/5 proximal to distal  B/l LE: HF 5/5, KE, ADF/PF 5/5 Sensation to light touch improving Skin: Skin is warm and dry. No rash noted. He is not diaphoretic. No erythema.  Psychiatric: He has a normal mood and affect. His behavior is normal. Judgment and thought content normal.     Assessment & Plan:  67 y.o. male with history of T2DM, OSA,,  subacute inflammatory  polyradiculoneuropathy treated with IVIG for lower extremity weakness 11/2016 presents for follow up for cervical myelopathy and AIDP.   1.  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  Cont HEP  Cont follow up Neurosurg  Has returned to work   2.  Pain Management:   Gabapentin and Lyrica d/ced due to edema  Cont Lidoderm patch  Cont TENS  Cont Elavil to 25 qhs  Will increase Cymbalta to 60mg   with food  Cont Baclofen per Neurology  3. Gait abnormality  Cont HEP, will consider reodering in future  Cont cane/walker for safety  4. Mechanical low back  MRI L-spine reviewed, multilevel predominantly formal and facet arthropathy  Cont Balcofen per PCP  Cymbalta increased  Will consider trigger point injections in future

## 2017-07-08 ENCOUNTER — Ambulatory Visit (HOSPITAL_COMMUNITY)
Admission: RE | Admit: 2017-07-08 | Discharge: 2017-07-08 | Disposition: A | Discharge status: Home / Self Care | Payer: BLUE CROSS/BLUE SHIELD | Source: Ambulatory Visit | Attending: Neurology | Admitting: Neurology

## 2017-07-08 DIAGNOSIS — G6181 Chronic inflammatory demyelinating polyneuritis: Secondary | ICD-10-CM | POA: Diagnosis not present

## 2017-07-08 MED ORDER — IMMUNE GLOBULIN (HUMAN) 5 GM/50ML IV SOLN
1.0000 g/kg | INTRAVENOUS | Status: DC
  Administered 2017-07-08: 105 g via INTRAVENOUS
  Filled 2017-07-08: qty 50

## 2017-07-12 DIAGNOSIS — G378 Other specified demyelinating diseases of central nervous system: Secondary | ICD-10-CM | POA: Diagnosis not present

## 2017-08-04 ENCOUNTER — Other Ambulatory Visit: Payer: Self-pay | Admitting: *Deleted

## 2017-08-04 ENCOUNTER — Telehealth: Payer: Self-pay | Admitting: Neurology

## 2017-08-04 DIAGNOSIS — G6181 Chronic inflammatory demyelinating polyneuritis: Secondary | ICD-10-CM

## 2017-08-04 DIAGNOSIS — R030 Elevated blood-pressure reading, without diagnosis of hypertension: Secondary | ICD-10-CM | POA: Diagnosis not present

## 2017-08-04 DIAGNOSIS — G992 Myelopathy in diseases classified elsewhere: Secondary | ICD-10-CM | POA: Diagnosis not present

## 2017-08-04 DIAGNOSIS — M4802 Spinal stenosis, cervical region: Secondary | ICD-10-CM | POA: Diagnosis not present

## 2017-08-04 DIAGNOSIS — Z6830 Body mass index (BMI) 30.0-30.9, adult: Secondary | ICD-10-CM | POA: Diagnosis not present

## 2017-08-05 ENCOUNTER — Ambulatory Visit (HOSPITAL_COMMUNITY)
Admission: RE | Admit: 2017-08-05 | Discharge: 2017-08-05 | Disposition: A | Discharge status: Home / Self Care | Payer: BLUE CROSS/BLUE SHIELD | Source: Ambulatory Visit | Attending: Neurology | Admitting: Neurology

## 2017-08-05 DIAGNOSIS — G6181 Chronic inflammatory demyelinating polyneuritis: Secondary | ICD-10-CM | POA: Diagnosis not present

## 2017-08-05 MED ORDER — IMMUNE GLOBULIN (HUMAN) 5 GM/50ML IV SOLN
1.0000 g/kg | INTRAVENOUS | Status: DC
  Administered 2017-08-05: 105 g via INTRAVENOUS
  Filled 2017-08-05 (×2): qty 50

## 2017-08-17 DIAGNOSIS — M25561 Pain in right knee: Secondary | ICD-10-CM | POA: Diagnosis not present

## 2017-08-18 ENCOUNTER — Encounter: Payer: Self-pay | Admitting: Endocrinology

## 2017-08-18 DIAGNOSIS — H04123 Dry eye syndrome of bilateral lacrimal glands: Secondary | ICD-10-CM | POA: Diagnosis not present

## 2017-08-18 DIAGNOSIS — H2513 Age-related nuclear cataract, bilateral: Secondary | ICD-10-CM | POA: Diagnosis not present

## 2017-08-18 DIAGNOSIS — E113293 Type 2 diabetes mellitus with mild nonproliferative diabetic retinopathy without macular edema, bilateral: Secondary | ICD-10-CM | POA: Diagnosis not present

## 2017-08-18 LAB — HM DIABETES EYE EXAM

## 2017-08-27 ENCOUNTER — Encounter
Payer: BLUE CROSS/BLUE SHIELD | Attending: Physical Medicine & Rehabilitation | Admitting: Physical Medicine & Rehabilitation

## 2017-08-27 ENCOUNTER — Encounter: Payer: Self-pay | Admitting: Physical Medicine & Rehabilitation

## 2017-08-27 VITALS — BP 126/68 | HR 76 | Resp 14 | Ht 72.0 in | Wt 234.0 lb

## 2017-08-27 DIAGNOSIS — N529 Male erectile dysfunction, unspecified: Secondary | ICD-10-CM | POA: Diagnosis not present

## 2017-08-27 DIAGNOSIS — R269 Unspecified abnormalities of gait and mobility: Secondary | ICD-10-CM | POA: Insufficient documentation

## 2017-08-27 DIAGNOSIS — G610 Guillain-Barre syndrome: Secondary | ICD-10-CM | POA: Insufficient documentation

## 2017-08-27 DIAGNOSIS — M4712 Other spondylosis with myelopathy, cervical region: Secondary | ICD-10-CM

## 2017-08-27 DIAGNOSIS — M4802 Spinal stenosis, cervical region: Secondary | ICD-10-CM | POA: Insufficient documentation

## 2017-08-27 DIAGNOSIS — Z820 Family history of epilepsy and other diseases of the nervous system: Secondary | ICD-10-CM | POA: Insufficient documentation

## 2017-08-27 DIAGNOSIS — G959 Disease of spinal cord, unspecified: Secondary | ICD-10-CM | POA: Diagnosis not present

## 2017-08-27 DIAGNOSIS — Z8261 Family history of arthritis: Secondary | ICD-10-CM | POA: Diagnosis not present

## 2017-08-27 DIAGNOSIS — N319 Neuromuscular dysfunction of bladder, unspecified: Secondary | ICD-10-CM | POA: Insufficient documentation

## 2017-08-27 DIAGNOSIS — Z981 Arthrodesis status: Secondary | ICD-10-CM | POA: Insufficient documentation

## 2017-08-27 DIAGNOSIS — N400 Enlarged prostate without lower urinary tract symptoms: Secondary | ICD-10-CM | POA: Diagnosis not present

## 2017-08-27 DIAGNOSIS — E109 Type 1 diabetes mellitus without complications: Secondary | ICD-10-CM | POA: Insufficient documentation

## 2017-08-27 DIAGNOSIS — Z833 Family history of diabetes mellitus: Secondary | ICD-10-CM | POA: Diagnosis not present

## 2017-08-27 DIAGNOSIS — R531 Weakness: Secondary | ICD-10-CM | POA: Insufficient documentation

## 2017-08-27 DIAGNOSIS — G4733 Obstructive sleep apnea (adult) (pediatric): Secondary | ICD-10-CM | POA: Insufficient documentation

## 2017-08-27 DIAGNOSIS — E785 Hyperlipidemia, unspecified: Secondary | ICD-10-CM | POA: Diagnosis not present

## 2017-08-27 DIAGNOSIS — M792 Neuralgia and neuritis, unspecified: Secondary | ICD-10-CM

## 2017-08-27 DIAGNOSIS — Z8249 Family history of ischemic heart disease and other diseases of the circulatory system: Secondary | ICD-10-CM | POA: Insufficient documentation

## 2017-08-27 DIAGNOSIS — Z9889 Other specified postprocedural states: Secondary | ICD-10-CM | POA: Diagnosis not present

## 2017-08-27 DIAGNOSIS — I10 Essential (primary) hypertension: Secondary | ICD-10-CM | POA: Diagnosis not present

## 2017-08-27 DIAGNOSIS — Z9641 Presence of insulin pump (external) (internal): Secondary | ICD-10-CM | POA: Diagnosis not present

## 2017-08-27 MED ORDER — AMITRIPTYLINE HCL 25 MG PO TABS: 25.0000 mg | ORAL_TABLET | Freq: Every day | ORAL | 0 refills | Status: DC

## 2017-08-27 MED ORDER — DULOXETINE HCL 60 MG PO CPEP: 60.0000 mg | ORAL_CAPSULE | Freq: Every day | ORAL | 2 refills | Status: DC

## 2017-08-27 NOTE — Progress Notes (Signed)
Subjective:    Patient ID: Kenneth Pace, male    DOB: [birth year], 67 y.o.   MRN: [id number]  HPI 67 y.o. 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   Last clinic visit 07/07/17.  Since that time, pt states he went to [place] for a steroid injection in his left knee and he notes some improvement. He continues to do HEP. He states he is doing better.  He is doing well with Cymbalta. Denies falls. He is doing more activity. He continues to have monthly IVIG.   Pain Inventory Average Pain 3 Pain Right Now 3 My pain is intermittent, dull and tingling  In the last 24 hours, has pain interfered with the following? General activity 3 Relation with others 0 Enjoyment of life 0 What TIME of day is your pain at its worst? night, evening Sleep fair to good Pain is worse with: other Pain improves with: rest, heat/ice, medication and TENS Relief from Meds: 5  Mobility walk with assistance use a cane ability to climb steps?  yes do you drive?  yes Do you have any goals in this area?  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  no  Physicians involved in your care Any changes since last visit?  no   Family History  Problem Relation Age of Onset  . Dementia Mother   . Diabetes Mellitus I Mother   . Hypertension Father        21  . Healthy Sister   . Rheum arthritis Brother   . Cancer Neg Hx    Social History   Socioeconomic History  . Marital status: Married    Spouse name: Not on file  . Number of children: Not on file  . Years of education: Not on file  . Highest education level: Not on file  Occupational History  . 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 Financial resource strain: Not on file  . Food insecurity:    Worry: Not on file    Inability: Not on file  . Transportation needs:    Medical: Not on file    Non-medical: Not on file  Tobacco Use  . Smoking status: Never Smoker  . Smokeless tobacco: Never Used  Substance and Sexual Activity  . Alcohol use: No  . Drug use: No  . Sexual activity: Not on file  Lifestyle  . Physical activity:    Days per week: Not on file    Minutes per session: Not on file  . Stress: Not on file  Relationships  . Social connections:    Talks on phone: Not on file    Gets together: Not on file    Attends religious service: Not on file    Active member of club or organization: Not on file    Attends meetings of clubs or organizations: Not on file    Relationship status: Not on file  Other Topics Concern  . Not on file  Social History Narrative   He works for [employer] [place]   He lives at home with wife.     Highest level of education:  BS, business admin   Past Surgical History:  Procedure Laterality Date  . ANTERIOR CERVICAL DECOMP/DISCECTOMY FUSION N/A 02/01/2017   Procedure: ANTERIOR CERVICAL DECOMPRESSION/DISCECTOMY FUSION CERVICAL THREE-FOUR , CERVICAL FOUR-FIVE  CERVICAL FIVE-SIX;  Surgeon: Consuella Lose, MD;  Location: [place];  Service: Neurosurgery;  Laterality: N/A;  . KNEE SURGERY     2 Lknee arthroscopy, 3 R knee arthroscpoy  . KNEE SURGERY Right 11/12/2016  . TONSILLECTOMY     Past Medical History:  Diagnosis Date  . ADENOIDECTOMY, HX OF 02/16/2007  . Anxiety    pt. states he does not have anxiety.  Marland Kitchen BENIGN PROSTATIC HYPERTROPHY, WITH OBSTRUCTION 02/03/2010  . DIABETES MELLITUS, TYPE I, UNCONTROLLED 12/29/2006  . ERECTILE DYSFUNCTION 02/16/2007  . HYPERLIPIDEMIA 02/16/2007  . HYPERTENSION 02/16/2007  . TESTICULAR MASS, LEFT 02/16/2007   BP 126/68 (BP Location: Left Arm, Patient Position: Sitting, Cuff Size: Normal)   Pulse 76   Resp 14    Ht 6' (1.829 m)   Wt 234 lb (106.1 kg)   SpO2 95%   BMI 31.74 kg/m   Opioid Risk Score:   Fall Risk Score:  `1  Depression screen PHQ 2/9  Depression screen [facility] 2/9 07/07/2017 02/26/2017 02/26/2017  Decreased Interest 0 0 0  Down, Depressed, Hopeless 0 0 0  PHQ - 2 Score 0 0 0  Altered sleeping - 1 -  Tired, decreased energy - 0 -  Change in appetite - 0 -  Feeling bad or failure about yourself  - 0 -  Trouble concentrating - 0 -  Moving slowly or fidgety/restless - 1 -  Suicidal thoughts - 0 -  PHQ-9 Score - 2 -  Difficult doing work/chores - Not difficult at all -   Review of Systems  HENT: Negative.   Eyes: Negative.   Respiratory: Positive for apnea.   Cardiovascular: Positive for leg swelling.  Gastrointestinal: Negative.   Endocrine: Negative.   Genitourinary: Negative.   Musculoskeletal: Positive for arthralgias, gait problem, myalgias and neck pain.  Skin: Negative.   Allergic/Immunologic: Negative.   Neurological: Positive for numbness.       Tingling   Hematological: Negative.   Psychiatric/Behavioral: Negative.   All other systems reviewed and are negative.     Objective:   Physical Exam Constitutional: He appears well-developed and well-nourished. No distress.  HENT: Normocephalic and atraumatic.  Eyes: EOM are normal. No discharge.  Neck: Right neck with honey comb dressing and edema, improving Cardiovascular: RRR. No JVD.  Respiratory: Effort normal and breath sounds normal.  GI: He exhibits no distention. BS+  Musculoskeletal: He exhibits no edema or tenderness in extremities.  +TTP R>L lumbar PSP Neurological: He is alert and oriented.  Motor: B/L UE 5/5 proximal to distal  B/l LE: HF 5/5, KE, ADF/PF 5/5 Sensation to light touch improving Skin: Skin is warm and dry. No rash noted. He is not diaphoretic. No erythema.  Psychiatric: He has a normal mood and affect. His behavior is normal. Judgment and thought content normal.     Assessment &  Plan:  67 y.o. 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   1.  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  Cont HEP  Cont follow up Neurology - monthly IVIG per Neurology  Has returned to work   2.  Pain Management:   Gabapentin and Lyrica d/ced due to edema  Cont Lidoderm patch  Cont TENS  Cont Elavil to 25 qhs - denies side effects  Cont Cymbalta to 60mg  with food  Cont Baclofen per Neurology  Encouraged trial Tylenol/Ibu, encouraged to stop Tramadol  3. Gait abnormality  Cont HEP  Cont cane  4. Mechanical low back  MRI L-spine reviewed, multilevel predominantly formal and facet arthropathy  Cont Balcofen per PCP  Cont Cymbalta   Will consider trigger point injections in future

## 2017-09-02 ENCOUNTER — Ambulatory Visit (HOSPITAL_COMMUNITY)
Admission: RE | Admit: 2017-09-02 | Discharge: 2017-09-02 | Disposition: A | Discharge status: Home / Self Care | Payer: BLUE CROSS/BLUE SHIELD | Source: Ambulatory Visit | Attending: Nephrology | Admitting: Nephrology

## 2017-09-02 DIAGNOSIS — G6181 Chronic inflammatory demyelinating polyneuritis: Secondary | ICD-10-CM | POA: Insufficient documentation

## 2017-09-02 MED ORDER — DEXTROSE 5 % IV SOLN
INTRAVENOUS | Status: DC | PRN
  Administered 2017-09-02: 09:00:00 via INTRAVENOUS

## 2017-09-02 MED ORDER — IMMUNE GLOBULIN (HUMAN) 5 GM/50ML IV SOLN
1.0000 g/kg | INTRAVENOUS | Status: DC
  Administered 2017-09-02: 105 g via INTRAVENOUS
  Filled 2017-09-02: qty 50

## 2017-09-14 ENCOUNTER — Ambulatory Visit (INDEPENDENT_AMBULATORY_CARE_PROVIDER_SITE_OTHER): Payer: BLUE CROSS/BLUE SHIELD | Admitting: Endocrinology

## 2017-09-14 VITALS — BP 140/80 | HR 90 | Temp 97.9°F | Ht 72.0 in | Wt 234.5 lb

## 2017-09-14 DIAGNOSIS — Z0000 Encounter for general adult medical examination without abnormal findings: Secondary | ICD-10-CM | POA: Diagnosis not present

## 2017-09-14 DIAGNOSIS — E119 Type 2 diabetes mellitus without complications: Secondary | ICD-10-CM

## 2017-09-14 DIAGNOSIS — Z23 Encounter for immunization: Secondary | ICD-10-CM

## 2017-09-14 DIAGNOSIS — G4733 Obstructive sleep apnea (adult) (pediatric): Secondary | ICD-10-CM | POA: Diagnosis not present

## 2017-09-14 LAB — POCT GLYCOSYLATED HEMOGLOBIN (HGB A1C): Hemoglobin A1C: 8.7 % — AB (ref 4.0–5.6)

## 2017-09-14 NOTE — Patient Instructions (Addendum)
Please take these pump settings: basal rate of 4.4 units/hr 6 AM-10 PM, and 2.6 units/hr 10 PM-6 AM mealtime bolus of 1 unit with each meal.   correction bolus (which some people call "sensitivity," or "insulin sensitivity ratio," or just "isr") of 1 unit for each 50 by which your glucose exceeds 100.  suspend the pump for 1-2 hrs, with activity.  If not, eat a light snack with it.   On this type of insulin schedule, you should eat meals on a regular schedule.  If a meal is missed or significantly delayed, your blood sugar could go low.  Please consider these measures for your health:  minimize alcohol.  Do not use tobacco products.  Have a colonoscopy at least every 10 years from age 63.  Keep firearms safely stored.  Always use seat belts.  have working smoke alarms in your home.  See an eye doctor and dentist regularly.  Never drive under the influence of alcohol or drugs (including prescription drugs).  Those with fair skin should take precautions against the sun, and should carefully examine their skin once per month, for any new or changed moles.  Please come back for a follow-up appointment in 2 months.

## 2017-09-14 NOTE — Progress Notes (Signed)
Subjective:    Patient ID: Kenneth Pace, male    DOB: 03/24/1950, 67 y.o.   MRN: [id number]  HPI Pt is here for regular wellness examination, and is feeling pretty well in general, and says chronic med probs are stable, except as noted below Past Medical History:  Diagnosis Date  . ADENOIDECTOMY, HX OF 02/16/2007  . Anxiety    pt. states he does not have anxiety.  Marland Kitchen BENIGN PROSTATIC HYPERTROPHY, WITH OBSTRUCTION 02/03/2010  . DIABETES MELLITUS, TYPE I, UNCONTROLLED 12/29/2006  . ERECTILE DYSFUNCTION 02/16/2007  . HYPERLIPIDEMIA 02/16/2007  . HYPERTENSION 02/16/2007  . TESTICULAR MASS, LEFT 02/16/2007    Past Surgical History:  Procedure Laterality Date  . ANTERIOR CERVICAL DECOMP/DISCECTOMY FUSION N/A 02/01/2017   Procedure: ANTERIOR CERVICAL DECOMPRESSION/DISCECTOMY FUSION CERVICAL THREE-FOUR , CERVICAL FOUR-FIVE  CERVICAL FIVE-SIX;  Surgeon: Consuella Lose, MD;  Location: [facility];  Service: Neurosurgery;  Laterality: N/A;  . KNEE SURGERY     2 Lknee arthroscopy, 3 R knee arthroscpoy  . KNEE SURGERY Right 11/12/2016  . TONSILLECTOMY      Social History   Socioeconomic History  . Marital status: Married    Spouse name: Not on file  . Number of children: Not on file  . Years of education: Not on file  . Highest education level: Not on file  Occupational History  . Occupation: Lobbyist: [employer]    Comment: Retail buyer  Social Needs  . Financial resource strain: Not on file  . Food insecurity:    Worry: Not on file    Inability: Not on file  . Transportation needs:    Medical: Not on file    Non-medical: Not on file  Tobacco Use  . Smoking status: Never Smoker  . Smokeless tobacco: Never Used  Substance and Sexual Activity  . Alcohol use: No  . Drug use: No  . Sexual activity: Not on file  Lifestyle  . Physical activity:    Days per week: Not on file    Minutes per session: Not on file  . Stress: Not on file    Relationships  . Social connections:    Talks on phone: Not on file    Gets together: Not on file    Attends religious service: Not on file    Active member of club or organization: Not on file    Attends meetings of clubs or organizations: Not on file    Relationship status: Not on file  . Intimate partner violence:    Fear of current or ex partner: Not on file    Emotionally abused: Not on file    Physically abused: Not on file    Forced sexual activity: Not on file  Other Topics Concern  . Not on file  Social History Narrative   He works for [employer]    He lives at home with wife.     Highest level of education:  BS, business admin    Current Outpatient Medications on File Prior to Visit  Medication Sig Dispense Refill  . acetaminophen (TYLENOL) 325 MG tablet Take 2 tablets (650 mg total) by mouth every 6 (six) hours as needed for mild pain or headache.    Marland Kitchen amitriptyline (ELAVIL) 25 MG tablet Take 1 tablet (25 mg total) by mouth at bedtime. 30 tablet 0  . baclofen (LIORESAL) 10 MG tablet Take 1 tablet (10 mg total) by mouth 3 (three) times daily. [place]  each 11  . busPIRone (BUSPAR) 30 MG tablet TAKE ONE-HALF (1/2) TABLET (15 MG) TWICE A DAY 180 tablet 1  . Continuous Glucose Monitor Sup (ENLITE GLUCOSE SENSOR) MISC 1 Device by Does not apply route every 3 (three) days. 30 each 3  . Cranberry 400 MG TABS Take 2 each by mouth daily.      . Cyanocobalamin (VITAMIN B-12 CR PO) Take 1 each by mouth daily.      . DULoxetine (CYMBALTA) 60 MG capsule Take 1 capsule (60 mg total) by mouth daily. 30 capsule 2  . finasteride (PROSCAR) 5 MG tablet Take 5 mg by mouth daily.     Marland Kitchen glucagon 1 MG injection Inject 1 mg into the vein once as needed. 1 each 12  . glucose blood (BAYER CONTOUR NEXT TEST) test strip Use to check blood sugar 5 times per day dx 250.01, 500 each 3  . HUMALOG 100 UNIT/ML injection INJECT 120 UNITS DAILY IN PUMP 100 mL 11  . Insulin Infusion Pump  Supplies (PARADIGM RESERVOIR 3ML) MISC 1 Device by Does not apply route every 3 (three) days. 30 each 3  . Insulin Infusion Pump Supplies (QUICK-SET INFUSION 43" 9MM) MISC 1 Device by Does not apply route every 3 (three) days. 30 each 3  . Lancets Misc. (ACCU-CHEK MULTICLIX LANCET DEV) KIT 1 Device by Other route 5 (five) times daily as needed. 5/day dx 250.01 450 each 3  . lidocaine (LIDODERM) 5 % Place 1 patch onto the skin daily. At 7 am and remove at 7 pm. Available over the counter. 30 patch 1  . lisinopril-hydrochlorothiazide (PRINZIDE,ZESTORETIC) 10-12.5 MG tablet TAKE 1 TABLET DAILY 90 tablet 1  . Multiple Vitamin (MULTIVITAMIN) capsule Take by mouth.    . Multiple Vitamins-Minerals (MULTIVITAMIN,TX-MINERALS) tablet Take 1 tablet by mouth daily.      . traMADol (ULTRAM) 50 MG tablet   1   No current facility-administered medications on file prior to visit.     No Known Allergies  Family History  Problem Relation Age of Onset  . Dementia Mother   . Diabetes Mellitus I Mother   . Hypertension Father        56  . Healthy Sister   . Rheum arthritis Brother   . Cancer Neg Hx     BP 140/80 (BP Location: Right Arm, Patient Position: Sitting, Cuff Size: Normal)   Pulse 90   Temp 97.9 F (36.6 C) (Oral)   Ht 6' (1.829 m)   Wt 234 lb 8 oz (106.4 kg)   SpO2 96%   BMI 31.80 kg/m     Review of Systems Denies fever, visual loss, hearing loss, chest pain, sob, depression, cold intolerance, BRBPR, hematuria, syncope, allergy sxs, easy bruising, and rash.  Fatigue is slowly improving.  No change in chronic low-back pain     Objective:   Physical Exam VS: see vs page GEN: no distress HEAD: head: no deformity eyes: no periorbital swelling, no proptosis external nose and ears are normal mouth: no lesion seen NECK: Neck: a healed scar is present (old c-spine procedure).  I do not appreciate a nodule in the thyroid or elsewhere in the neck CHEST WALL: no deformity LUNGS: clear to  auscultation CV: reg rate and rhythm, no murmur ABD: abdomen is soft, nontender.  no hepatosplenomegaly.  not distended.  no hernia.  Insulin infusion sites at the anterior abdomen are normal.   MUSCULOSKELETAL: muscle bulk and strength are grossly normal on UE's, but decreased on  the LE's.  no obvious joint swelling.  gait is normal and steady. PULSES: no carotid bruit NEURO:  cn 2-12 grossly intact.   readily moves all 4's.  SKIN:  Normal texture and temperature.  No rash or suspicious lesion is visible.  NODES:  None palpable at the neck PSYCH: alert, well-oriented.  Does not appear anxious nor depressed.    I personally reviewed electrocardiogram tracing (today): Indication: DM Impression: NSR.  No MI.  No hypertrophy. Compared to 2018: no significant change      Assessment & Plan:  Wellness visit today, with problems stable, except as noted.  SEPARATE EVALUATION FOLLOWS--EACH PROBLEM HERE IS NEW, NOT RESPONDING TO TREATMENT, OR POSES SIGNIFICANT RISK TO THE PATIENT'S HEALTH: HISTORY OF THE PRESENT ILLNESS: Pt returns for f/u of diabetes mellitus:  DM type: 1 Dx'ed: 6599 Complications: retinopathy and polyneuropathy.   Therapy: insulin since dx.  DKA: only at dx.   Severe hypoglycemia: most recently in 2014.  Pancreatitis: never Other: he has a medtronic pump and continuous glucose monitor; he is still on short-term disability.  continuous glucose monitor ID is [email or web address] and password is [email or web address].  Interval history:  Please continue these pump settings: basal rate of 4.1 units/hr 6 AM-10 PM, and 2.6 units/hr 10 PM-6 AM mealtime bolus of 1 unit with each meal.   correction bolus (which some people call "sensitivity," or "insulin sensitivity ratio," or just "isr") of 1 unit for each 50 by which your glucose exceeds 100.  suspend the pump for 1-2 hrs, with activity.  If not, eat a light snack with it.   He seldom checks cbg.  He says It is in general higher as the day  goes on.   PAST MEDICAL HISTORY Past Medical History:  Diagnosis Date  . ADENOIDECTOMY, HX OF 02/16/2007  . Anxiety    pt. states he does not have anxiety.  Marland Kitchen BENIGN PROSTATIC HYPERTROPHY, WITH OBSTRUCTION 02/03/2010  . DIABETES MELLITUS, TYPE I, UNCONTROLLED 12/29/2006  . ERECTILE DYSFUNCTION 02/16/2007  . HYPERLIPIDEMIA 02/16/2007  . HYPERTENSION 02/16/2007  . TESTICULAR MASS, LEFT 02/16/2007    Past Surgical History:  Procedure Laterality Date  . ANTERIOR CERVICAL DECOMP/DISCECTOMY FUSION N/A 02/01/2017   Procedure: ANTERIOR CERVICAL DECOMPRESSION/DISCECTOMY FUSION CERVICAL THREE-FOUR , CERVICAL FOUR-FIVE  CERVICAL FIVE-SIX;  Surgeon: Consuella Lose, MD;  Location: [facility];  Service: Neurosurgery;  Laterality: N/A;  . KNEE SURGERY     2 Lknee arthroscopy, 3 R knee arthroscpoy  . KNEE SURGERY Right 11/12/2016  . TONSILLECTOMY      Social History   Socioeconomic History  . Marital status: Married    Spouse name: Not on file  . Number of children: Not on file  . Years of education: Not on file  . Highest education level: Not on file  Occupational History  . 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 Financial resource strain: Not on file  . Food insecurity:    Worry: Not on file    Inability: Not on file  . Transportation needs:    Medical: Not on file    Non-medical: Not on file  Tobacco Use  . Smoking status: Never Smoker  . Smokeless tobacco: Never Used  Substance and Sexual Activity  . Alcohol use: No  . Drug use: No  . Sexual activity: Not on file  Lifestyle  . Physical activity:    Days per week: Not on file  Minutes per session: Not on file  . Stress: Not on file  Relationships  . Social connections:    Talks on phone: Not on file    Gets together: Not on file    Attends religious service: Not on file    Active member of club or organization: Not on file    Attends meetings of clubs  or organizations: Not on file    Relationship status: Not on file  . Intimate partner violence:    Fear of current or ex partner: Not on file    Emotionally abused: Not on file    Physically abused: Not on file    Forced sexual activity: Not on file  Other Topics Concern  . Not on file  Social History Narrative   He works for [employer] [place]   He lives at home with wife.     Highest level of education:  BS, business admin    Current Outpatient Medications on File Prior to Visit  Medication Sig Dispense Refill  . acetaminophen (TYLENOL) 325 MG tablet Take 2 tablets (650 mg total) by mouth every 6 (six) hours as needed for mild pain or headache.    Marland Kitchen amitriptyline (ELAVIL) 25 MG tablet Take 1 tablet (25 mg total) by mouth at bedtime. 30 tablet 0  . baclofen (LIORESAL) 10 MG tablet Take 1 tablet (10 mg total) by mouth 3 (three) times daily. 90 each 11  . busPIRone (BUSPAR) 30 MG tablet TAKE ONE-HALF (1/2) TABLET (15 MG) TWICE A DAY 180 tablet 1  . Continuous Glucose Monitor Sup (ENLITE GLUCOSE SENSOR) MISC 1 Device by Does not apply route every 3 (three) days. 30 each 3  . Cranberry 400 MG TABS Take 2 each by mouth daily.      . Cyanocobalamin (VITAMIN B-12 CR PO) Take 1 each by mouth daily.      . DULoxetine (CYMBALTA) 60 MG capsule Take 1 capsule (60 mg total) by mouth daily. 30 capsule 2  . finasteride (PROSCAR) 5 MG tablet Take 5 mg by mouth daily.     Marland Kitchen glucagon 1 MG injection Inject 1 mg into the vein once as needed. 1 each 12  . glucose blood (BAYER CONTOUR NEXT TEST) test strip Use to check blood sugar 5 times per day dx 250.01, 500 each 3  . HUMALOG 100 UNIT/ML injection INJECT 120 UNITS DAILY IN PUMP 100 mL 11  . Insulin Infusion Pump Supplies (PARADIGM RESERVOIR 3ML) MISC 1 Device by Does not apply route every 3 (three) days. 30 each 3  . Insulin Infusion Pump Supplies (QUICK-SET INFUSION 43" 9MM) MISC 1 Device by Does not apply route every 3 (three) days. 30  each 3  . Lancets Misc. (ACCU-CHEK MULTICLIX LANCET DEV) KIT 1 Device by Other route 5 (five) times daily as needed. 5/day dx 250.01 450 each 3  . lidocaine (LIDODERM) 5 % Place 1 patch onto the skin daily. At 7 am and remove at 7 pm. Available over the counter. 30 patch 1  . lisinopril-hydrochlorothiazide (PRINZIDE,ZESTORETIC) 10-12.5 MG tablet TAKE 1 TABLET DAILY 90 tablet 1  . Multiple Vitamin (MULTIVITAMIN) capsule Take by mouth.    . Multiple Vitamins-Minerals (MULTIVITAMIN,TX-MINERALS) tablet Take 1 tablet by mouth daily.      . traMADol (ULTRAM) 50 MG tablet   1   No current facility-administered medications on file prior to visit.     No Known Allergies  Family History  Problem Relation Age of Onset  .  Dementia Mother   . Diabetes Mellitus I Mother   . Hypertension Father        28  . Healthy Sister   . Rheum arthritis Brother   . Cancer Neg Hx     BP 140/80 (BP Location: Right Arm, Patient Position: Sitting, Cuff Size: Normal)   Pulse 90   Temp 97.9 F (36.6 C) (Oral)   Ht 6' (1.829 m)   Wt 234 lb 8 oz (106.4 kg)   SpO2 96%   BMI 31.80 kg/m   REVIEW OF SYSTEMS:  He denies hypoglycemia. PHYSICAL EXAMINATION: VITAL SIGNS:  See vs page GENERAL: no distress Pulses: dorsalis pedis intact bilat.   MSK: no deformity of the feet CV: no leg edema Skin:  no ulcer on the feet.  normal color and temp on the feet. Neuro: sensation is intact to touch on the feet, but decreased from normal  LAB/XRAY RESULTS: Lab Results  Component Value Date   HGBA1C 8.7 (A) 09/14/2017   IMPRESSION: Type 1 DM, with DR: worse PLAN:  Please take these pump settings: basal rate of 4.4 units/hr 6 AM-10 PM, and 2.6 units/hr 10 PM-6 AM mealtime bolus of 1 unit with each meal.   correction bolus (which some people call "sensitivity," or "insulin sensitivity ratio," or just "isr") of 1 unit for each 50 by which your glucose exceeds 100.  suspend the pump for 1-2 hrs, with activity.  If not,  eat a light snack with it

## 2017-09-24 ENCOUNTER — Encounter: Payer: Self-pay | Admitting: Gastroenterology

## 2017-09-29 ENCOUNTER — Other Ambulatory Visit: Payer: Self-pay | Admitting: Endocrinology

## 2017-09-30 ENCOUNTER — Ambulatory Visit (HOSPITAL_COMMUNITY)
Admission: RE | Admit: 2017-09-30 | Discharge: 2017-09-30 | Disposition: A | Discharge status: Home / Self Care | Payer: BLUE CROSS/BLUE SHIELD | Source: Ambulatory Visit | Attending: Neurology | Admitting: Neurology

## 2017-09-30 DIAGNOSIS — G6181 Chronic inflammatory demyelinating polyneuritis: Secondary | ICD-10-CM | POA: Diagnosis not present

## 2017-09-30 DIAGNOSIS — G4733 Obstructive sleep apnea (adult) (pediatric): Secondary | ICD-10-CM | POA: Diagnosis not present

## 2017-09-30 MED ORDER — DEXTROSE 5 % IV SOLN
INTRAVENOUS | Status: DC | PRN
  Administered 2017-09-30: 09:00:00 via INTRAVENOUS

## 2017-09-30 MED ORDER — IMMUNE GLOBULIN (HUMAN) 5 GM/50ML IV SOLN
1.0000 g/kg | INTRAVENOUS | Status: DC
  Administered 2017-09-30: 105 g via INTRAVENOUS
  Filled 2017-09-30: qty 50

## 2017-10-07 ENCOUNTER — Inpatient Hospital Stay (HOSPITAL_COMMUNITY): Admission: RE | Admit: 2017-10-07 | Payer: BLUE CROSS/BLUE SHIELD | Source: Ambulatory Visit

## 2017-10-12 DIAGNOSIS — B351 Tinea unguium: Secondary | ICD-10-CM | POA: Diagnosis not present

## 2017-10-12 DIAGNOSIS — L03032 Cellulitis of left toe: Secondary | ICD-10-CM | POA: Diagnosis not present

## 2017-10-12 DIAGNOSIS — L03031 Cellulitis of right toe: Secondary | ICD-10-CM | POA: Diagnosis not present

## 2017-10-15 ENCOUNTER — Other Ambulatory Visit: Payer: Self-pay | Admitting: Physical Medicine & Rehabilitation

## 2017-10-28 ENCOUNTER — Ambulatory Visit (HOSPITAL_COMMUNITY)
Admission: RE | Admit: 2017-10-28 | Discharge: 2017-10-28 | Disposition: A | Discharge status: Home / Self Care | Payer: BLUE CROSS/BLUE SHIELD | Source: Ambulatory Visit | Attending: Neurology | Admitting: Neurology

## 2017-10-28 DIAGNOSIS — G6181 Chronic inflammatory demyelinating polyneuritis: Secondary | ICD-10-CM | POA: Diagnosis not present

## 2017-10-28 MED ORDER — DEXTROSE 5 % IV SOLN: INTRAVENOUS | Status: DC | PRN

## 2017-10-28 MED ORDER — IMMUNE GLOBULIN (HUMAN) 5 GM/50ML IV SOLN
1.0000 g/kg | INTRAVENOUS | Status: DC
  Administered 2017-10-28: 105 g via INTRAVENOUS
  Filled 2017-10-28: qty 50

## 2017-10-29 ENCOUNTER — Encounter: Payer: Self-pay | Admitting: Neurology

## 2017-10-29 ENCOUNTER — Ambulatory Visit (INDEPENDENT_AMBULATORY_CARE_PROVIDER_SITE_OTHER): Payer: BLUE CROSS/BLUE SHIELD | Admitting: Neurology

## 2017-10-29 VITALS — BP 120/80 | HR 90 | Ht 72.0 in | Wt 237.2 lb

## 2017-10-29 DIAGNOSIS — G6181 Chronic inflammatory demyelinating polyneuritis: Secondary | ICD-10-CM

## 2017-10-29 DIAGNOSIS — G959 Disease of spinal cord, unspecified: Secondary | ICD-10-CM | POA: Diagnosis not present

## 2017-10-29 MED ORDER — BACLOFEN 10 MG PO TABS: ORAL_TABLET | ORAL | 3 refills | Status: DC

## 2017-10-29 NOTE — Progress Notes (Signed)
Follow-up Visit   Date: 10/29/17   Kenneth Pace MRN: [id number] DOB: 1950/04/12   Interim History: Kenneth Pace is a 67 y.o. right-handed Caucasian male with insulin-dependent diabetes mellitus, hypertension, OSA on CPAP, hyperlipidemia, anxiety, and cervical myelopathy s/p decompression at C3-C6 returning to the clinic for follow-up of CIDP  The patient was accompanied to the clinic by wife who also provides collateral information.    History of present illness: Around late August, he started having electrical impulses of the arms, which started in the neck and radiates into the palm. He suffered a fall prior to this so he felt the he may have injured his neck.  A few weeks later, he also noticed weakness of the hands and unable to type at work.  In mid-September, he began having gait imbalance and started falling frequently. Since then, he has numbness/tingling of the feet and intermittent sharp pain radiating from his back into his mid-chest.  He has weakness of the both legs and has difficulty standing, climbing steps, and walking.  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   His testing showed albuminocytologic disocciation with CSF protein 81 and normal cell count.   He completed 5-days IVIG with the last dose on 10/9 and felt some improvement in the hands, but legs were weaker.  In the fall 2019, he developed progressive leg weakness and was found to have myelopathic findings.  Imaging of the cervical spine showed moderate multilevel cervical stenosis.  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 Kathyrn Sheriff on 02/01/2017 which has significantly improved his gait  and lower extremity strength.  He has been seeing Dr. Delice Lesch, PM&R, for rehab and pain management and getting out-patient physical therapy.   He continues to have numbness and tingling of the feet, as well as significant leg stiffness and imbalance.     UPDATE 06/15/2017:  Since starting baclofen, his cramps and gait has improved.  He no longer uses a walker and tends to use a cane most of the time, and challenges himself to walk unassisted, when he feels safe. He suffered one fall while doing things in his yard.  Pain is better controlled on amitriptyline 17m at bedtime, which is managed by Dr. ADelice Lesch  He is tolerating IVIG infusions but tends to get fatigued for 1-2 weeks following the infusion.  He continues to have right hand weakness and numbness.  He has returned to work and able to keep up with all his activities and job responsibilities.    UPDATE 10/29/2017:  He is here for follow-up visit.  Overall, he feels relatively unchanged from his last visit.  No new weakness or numbness/tingling.  He has been tolerating IVIG every 4 weeks and feels that benefit has plateaued. His gait continues to be spastic and assisted with a cane.  He has appreciated benefit with baclofen 160mTID, but still feels very stiff in the morning.  He has not been compliant with leg stretches.      Medications:  Current Outpatient Medications on File Prior to Visit  Medication Sig Dispense Refill  . acetaminophen (TYLENOL) 325 MG tablet Take 2 tablets (650 mg total) by mouth every 6 (six) hours as needed for mild pain or headache.    .Marland Kitchen  amitriptyline (ELAVIL) 25 MG tablet TAKE 1 TABLET(25 MG) BY MOUTH AT BEDTIME 30 tablet 3  . busPIRone (BUSPAR) 30 MG tablet TAKE ONE-HALF (1/2) TABLET (15 MG) TWICE A DAY 180 tablet 1  . Continuous Glucose Monitor Sup (ENLITE GLUCOSE SENSOR) MISC 1 Device by Does not apply route every 3 (three) days. 30 each 3  . Cranberry 400 MG TABS Take 2 each by mouth daily.      .  Cyanocobalamin (VITAMIN B-12 CR PO) Take 1 each by mouth daily.      . DULoxetine (CYMBALTA) 60 MG capsule Take 1 capsule (60 mg total) by mouth daily. 30 capsule 2  . finasteride (PROSCAR) 5 MG tablet Take 5 mg by mouth daily.     Marland Kitchen glucagon 1 MG injection Inject 1 mg into the vein once as needed. 1 each 12  . glucose blood (BAYER CONTOUR NEXT TEST) test strip Use to check blood sugar 5 times per day dx 250.01, 500 each 3  . HUMALOG 100 UNIT/ML injection INJECT 120 UNITS DAILY IN PUMP 100 mL 11  . Insulin Infusion Pump Supplies (PARADIGM RESERVOIR 3ML) MISC 1 Device by Does not apply route every 3 (three) days. 30 each 3  . Insulin Infusion Pump Supplies (QUICK-SET INFUSION 43" 9MM) MISC 1 Device by Does not apply route every 3 (three) days. 30 each 3  . Lancets Misc. (ACCU-CHEK MULTICLIX LANCET DEV) KIT 1 Device by Other route 5 (five) times daily as needed. 5/day dx 250.01 450 each 3  . lidocaine (LIDODERM) 5 % Place 1 patch onto the skin daily. At 7 am and remove at 7 pm. Available over the counter. 30 patch 1  . lisinopril-hydrochlorothiazide (PRINZIDE,ZESTORETIC) 10-12.5 MG tablet TAKE 1 TABLET DAILY 90 tablet 1  . Multiple Vitamin (MULTIVITAMIN) capsule Take by mouth.    . Multiple Vitamins-Minerals (MULTIVITAMIN,TX-MINERALS) tablet Take 1 tablet by mouth daily.      . traMADol (ULTRAM) 50 MG tablet   1   No current facility-administered medications on file prior to visit.     Allergies: No Known Allergies  Review of Systems:  CONSTITUTIONAL: No fevers, chills, night sweats, or weight loss.  EYES: No visual changes or eye pain ENT: No hearing changes.  No history of nose bleeds.   RESPIRATORY: No cough, wheezing and shortness of breath.   CARDIOVASCULAR: Negative for chest pain, and palpitations.   GI: Negative for abdominal discomfort, blood in stools or black stools.  No recent change in bowel habits.   GU:  No history of incontinence.   MUSCLOSKELETAL: No history of joint pain  or swelling.  No myalgias.   SKIN: Negative for lesions, rash, and itching.   ENDOCRINE: Negative for cold or heat intolerance, polydipsia or goiter.   PSYCH:  No depression or anxiety symptoms.   NEURO: As Above.   Vital Signs:  BP 120/80   Pulse 90   Ht 6' (1.829 m)   Wt 237 lb 4 oz (107.6 kg)   SpO2 97%   BMI 32.18 kg/m   General Medical Exam:   General:  Well appearing, comfortable  Eyes/ENT: see cranial nerve examination.   Neck: No carotid bruits. Respiratory:  Clear to auscultation, good air entry bilaterally.   Cardiac:  Regular rate and rhythm, no murmur.   Ext:  No edema, increased tone in the legs  Neurological Exam: MENTAL STATUS including orientation to time, place, person, recent and remote memory, attention span and concentration, language, and fund of knowledge is  normal.  Speech is not dysarthric.  CRANIAL NERVES:  Pupils equal round and reactive to light.  Normal conjugate, extra-ocular eye movements in all directions of gaze.  No ptosis. Face is symmetric. Palate elevates symmetrically.  Tongue is midline.  MOTOR:  There is moderate right FDI and ADM atrophy.  No fasciculations or abnormal movements.  No pronator drift.    Right Upper Extremity:    Left Upper Extremity:    Deltoid  5/5   Deltoid  5/5   Biceps  5/5   Biceps  5/5   Triceps  5/5   Triceps  5/5   Wrist extensors  5/5   Wrist extensors  5/5   Wrist flexors  5/5   Wrist flexors  5/5   Finger extensors  5/5   Finger extensors  5/5   Finger flexors  5/5   Finger flexors  5/5   Dorsal interossei  4+/5   Dorsal interossei  5/5   Abductor pollicis  5/5   Abductor pollicis  5/5   Tone (Ashworth scale)  0  Tone (Ashworth scale)  0   Right Lower Extremity:    Left Lower Extremity:    Hip flexors  5/5   Hip flexors  5/5   Hip extensors  5/5   Hip extensors  5/5   Knee flexors  5/5   Knee flexors  5/5   Knee extensors  5/5   Knee extensors  5/5   Dorsiflexors  5/5   Dorsiflexors  5/5     Plantarflexors  5/5   Plantarflexors  5/5   Toe extensors  5/5   Toe extensors  5/5   Toe flexors  5/5   Toe flexors  5/5   Tone (Ashworth scale)  0+  Tone (Ashworth scale)  0+    MSRs:  Right                                                                 Left brachioradialis 2+  brachioradialis 2+  biceps 2+  biceps 2+  triceps 2+  triceps 2+  patellar 3+  patellar 3+  ankle jerk 2+  ankle jerk 2+  Hoffman no  Hoffman no  plantar response down  plantar response down   SENSORY:  Vibration is reduced to 100% at the MCP, 100% at the elbow and knees, and 60% distal to ankles.    COORDINATION/GAIT: Gait is spastic, assisted with cane and appears stable    Data: MRI cervical and lumbar spine 11/20/2016: 1. No focal cord signal abnormality or lesion. 2. Multilevel spondylosis of the cervical spine as described. 3. Moderate central and mild bilateral foraminal stenosis at C3-4. 4. Moderate central and bilateral foraminal narrowing at C4-5. 5. Moderate central and severe bilateral foraminal stenosis at C5-6. 6. Moderate foraminal narrowing bilaterally at C6-7 without significant central canal stenosis. 7. Short pedicles in the cervical and lumbar spine contribute to the stenosis. 8. Disc bulging at L2-3 and L3-4 without significant stenosis. 9. Disc bulging and facet hypertrophy at L4-5 with mild subarticular and foraminal stenosis bilaterally. 10. Mild left foraminal narrowing at L5-S1 secondary to asymmetric facet hypertrophy.  CSF 11/20/2016:  R1 W3 P81* G102*  No OCB, VDRL neg Labs 11/20/2016:  SPEP with IFE no M  protein, 486, TSH 1.051  Lab Results  Component Value Date   HGBA1C 8.7 (A) 09/14/2017   MRI cervical spine 12/31/2016:   1. No enlargement of the cranial nerves or cervical nerve roots. 2. No acute intracranial abnormality. 3. Multilevel moderate cervical spinal canal stenosis, worst at C3-4 and C5-6 with associated mild cord deformity but no cord signal change. 4.  Moderate to severe neural foraminal stenosis at C3-4, C4-5 and C5-6.  NCS/EMG of the right upper and lower extremities 04/27/2017: 1. 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  When compared to his previous study on 11/10/2016, there is mild interval improvement.  2. Chronic C6 radiculopathy affecting the right upper extremity, mild in degree electrically.  3. There is no evidence of a sensorimotor polyneuropathy or lumbosacral radiculopathy affecting the right lower extremity.    IMPRESSION/PLAN: 1.  Chronic inflammatory demyelinating polyradiculoneuropathy. Clinically stable with residual mild right hand weakness.  He was treated with IVIG x 5 and PLEX and has been on monthly IVIG.  Exam is stable as compared to his visit in April favoring that optimal management with IVIG has been achieved.  I will start to gradually taper IVIG to 1g/kg every 6 weeks x 2, then every 2 months x 2, and then stop.   2.  Myelopathy secondary to cervical canal stenosis s/p decompression from C3-6.  Increase baclofen to 68m at bedtime, continue 113min the morning and afternoon.  Stretching regimen was emphasized.  Continue to use a cane at all times.    Return to clinic in 4 months  Thank you for allowing me to participate in patient's care.  If I can answer any additional questions, I would be pleased to do so.    Sincerely,    Donika K. PaPosey ProntoDO

## 2017-10-29 NOTE — Patient Instructions (Signed)
Start baclofen 10mg  in the morning, 10mg  in the afternoon, and 15mg  at bedtime for two weeks. If you are doing well, increase nighttime dose to 20mg  at bedtime  Adjust IVIG to every 6 weeks  Return to clinic in January 2020

## 2017-11-02 ENCOUNTER — Telehealth: Payer: Self-pay | Admitting: *Deleted

## 2017-11-02 NOTE — Telephone Encounter (Signed)
We'll address at upcoming ov

## 2017-11-02 NOTE — Telephone Encounter (Signed)
NOTED. WILL LET PT KNOW THIS AT PREVISIT.

## 2017-11-02 NOTE — Telephone Encounter (Signed)
Kenneth Pace 05/11/1950 [id number]  Dear Dr. :Dr Ashby Dawes, MD has scheduled the above patient for a colonoscopy and endoscopy at [facility] on 11/22/17.  Our records show that he/she is on insulin therapy via an insulin pump.  Our colonoscopy prep protocol requires that:  the patient must be on a clear liquid diet the entire day prior to the procedure date as well as the morning of the procedure  the patient must be NPO for 2 hours prior to the procedure   the patient must consume a PEG 3350 solution to prepare for the procedure.  Please advise [place] of any adjustments that need to be made to the patient's insulin pump therapy prior to the above procedure date.    Please route or fax back this completed form to me at [phone] .  If you have any question, please call me at [phone].  Thank you for your help with this matter.  Sincerely,

## 2017-11-02 NOTE — Telephone Encounter (Signed)
Patient is for colonoscopy with Dr.Jacobs on 11/22/17. He has an insulin pump. Please contact PCP for insulin pump instructions. Thank you! Kenneth Pace

## 2017-11-03 NOTE — Telephone Encounter (Signed)
I spoke to [place], Patients wife who is also his medical POA. Patient has an appointment with Dr Loanne Drilling who will address his insulin pump needs on 11/15/17.

## 2017-11-08 ENCOUNTER — Ambulatory Visit (AMBULATORY_SURGERY_CENTER): Payer: Self-pay | Admitting: *Deleted

## 2017-11-08 ENCOUNTER — Encounter: Payer: Self-pay | Admitting: Gastroenterology

## 2017-11-08 VITALS — Ht 72.0 in | Wt 241.0 lb

## 2017-11-08 DIAGNOSIS — Z1211 Encounter for screening for malignant neoplasm of colon: Secondary | ICD-10-CM

## 2017-11-08 MED ORDER — NA SULFATE-K SULFATE-MG SULF 17.5-3.13-1.6 GM/177ML PO SOLN: ORAL | 0 refills | Status: DC

## 2017-11-08 NOTE — Progress Notes (Signed)
Patient denies any allergies to eggs or soy. Patient denies any problems with anesthesia/sedation. Patient denies any oxygen use at home. Patient denies taking any diet/weight loss medications or blood thinners. EMMI education offered, pt declined.  Suprep coupon printed and given to pt. 2 day prep explained to pt.

## 2017-11-15 ENCOUNTER — Ambulatory Visit (INDEPENDENT_AMBULATORY_CARE_PROVIDER_SITE_OTHER): Payer: BLUE CROSS/BLUE SHIELD | Admitting: Endocrinology

## 2017-11-15 ENCOUNTER — Encounter: Payer: Self-pay | Admitting: Endocrinology

## 2017-11-15 VITALS — BP 134/68 | HR 102 | Temp 98.6°F | Ht 72.0 in | Wt 239.8 lb

## 2017-11-15 DIAGNOSIS — E119 Type 2 diabetes mellitus without complications: Secondary | ICD-10-CM

## 2017-11-15 LAB — POCT GLYCOSYLATED HEMOGLOBIN (HGB A1C): Hemoglobin A1C: 8.3 % — AB (ref 4.0–5.6)

## 2017-11-15 NOTE — Progress Notes (Signed)
Subjective:    Patient ID: Kenneth Pace, male    DOB: Jun 29, 1950, 67 y.o.   MRN: [id number]  HPI Pt returns for f/u of diabetes mellitus:  DM type: 1 Dx'ed: 6384 Complications: retinopathy and polyneuropathy.   Therapy: insulin since dx.  DKA: only at dx.   Severe hypoglycemia: most recently in 2014.  Pancreatitis: never Other: he has a medtronic pump and continuous glucose monitor; he is still on short-term disability.  continuous glucose monitor ID is [email or web address] and password is [email or web address].  Interval history:  Please continue these pump settings: basal rate of 4.4 units/hr 6 AM-10 PM, and 2.6 units/hr 10 PM-6 AM.   mealtime bolus of 1 unit with each meal.   correction bolus (which some people call "sensitivity," or "insulin sensitivity ratio," or just "isr") of 1 unit for each 50 by which your glucose exceeds 100.  suspend the pump for 1-2 hrs, with activity.  If not, eat a light snack with it.   no cbg record, but states cbg's vary from 90-312.  There is no trend throughout the day.  He has not recently uploaded continuous glucose monitor.   Past Medical History:  Diagnosis Date  . ADENOIDECTOMY, HX OF 02/16/2007  . Anxiety    pt. states he does not have anxiety.  Marland Kitchen BENIGN PROSTATIC HYPERTROPHY, WITH OBSTRUCTION 02/03/2010  . DIABETES MELLITUS, TYPE I, UNCONTROLLED 12/29/2006  . ERECTILE DYSFUNCTION 02/16/2007  . HYPERLIPIDEMIA 02/16/2007  . HYPERTENSION 02/16/2007  . Sleep apnea    uses CPAP  . TESTICULAR MASS, LEFT 02/16/2007    Past Surgical History:  Procedure Laterality Date  . ANTERIOR CERVICAL DECOMP/DISCECTOMY FUSION N/A 02/01/2017   Procedure: ANTERIOR CERVICAL DECOMPRESSION/DISCECTOMY FUSION CERVICAL THREE-FOUR , CERVICAL FOUR-FIVE  CERVICAL FIVE-SIX;  Surgeon: Consuella Lose, MD;  Location: [facility];  Service: Neurosurgery;  Laterality: N/A;  . COLONOSCOPY  02/05/2010   [facility]  . KNEE SURGERY     2 Lknee arthroscopy, 3 R knee arthroscpoy  .  KNEE SURGERY Right 11/12/2016  . TONSILLECTOMY      Social History   Socioeconomic History  . Marital status: Married    Spouse name: Not on file  . Number of children: Not on file  . Years of education: Not on file  . Highest education level: Not on file  Occupational History  . Occupation: Lobbyist: [employer]    Comment: Retail buyer  Social Needs  . Financial resource strain: Not on file  . Food insecurity:    Worry: Not on file    Inability: Not on file  . Transportation needs:    Medical: Not on file    Non-medical: Not on file  Tobacco Use  . Smoking status: Never Smoker  . Smokeless tobacco: Never Used  Substance and Sexual Activity  . Alcohol use: No  . Drug use: No  . Sexual activity: Not on file  Lifestyle  . Physical activity:    Days per week: Not on file    Minutes per session: Not on file  . Stress: Not on file  Relationships  . Social connections:    Talks on phone: Not on file    Gets together: Not on file    Attends religious service: Not on file    Active member of club or organization: Not on file    Attends meetings of clubs or organizations: Not on file    Relationship status: Not on file  . Intimate partner  violence:    Fear of current or ex partner: Not on file    Emotionally abused: Not on file    Physically abused: Not on file    Forced sexual activity: Not on file  Other Topics Concern  . Not on file  Social History Narrative   He works for [employer]   He lives at home with wife.     Highest level of education:  BS, business admin    Current Outpatient Medications on File Prior to Visit  Medication Sig Dispense Refill  . acetaminophen (TYLENOL) 325 MG tablet Take 2 tablets (650 mg total) by mouth every 6 (six) hours as needed for mild pain or headache.    Marland Kitchen amitriptyline (ELAVIL) 25 MG tablet TAKE 1 TABLET(25 MG) BY MOUTH AT BEDTIME 30 tablet 3  . B Complex Vitamins  (B-COMPLEX/B-12 PO) Take by mouth.    . baclofen (LIORESAL) 10 MG tablet Take 1 tablet in the morning, 1 tablet in the afternoon, and 2 tablet at bedtime. 360 each 3  . busPIRone (BUSPAR) 30 MG tablet TAKE ONE-HALF (1/2) TABLET (15 MG) TWICE A DAY 180 tablet 1  . Continuous Glucose Monitor Sup (ENLITE GLUCOSE SENSOR) MISC 1 Device by Does not apply route every 3 (three) days. 30 each 3  . Cranberry 400 MG TABS Take 2 each by mouth daily.      . Cyanocobalamin (VITAMIN B-12 CR PO) Take 1 each by mouth daily.      . DULoxetine (CYMBALTA) 60 MG capsule Take 1 capsule (60 mg total) by mouth daily. 30 capsule 2  . finasteride (PROSCAR) 5 MG tablet Take 5 mg by mouth daily.     Marland Kitchen glucagon 1 MG injection Inject 1 mg into the vein once as needed. 1 each 12  . glucose blood (BAYER CONTOUR NEXT TEST) test strip Use to check blood sugar 5 times per day dx 250.01, 500 each 3  . HUMALOG 100 UNIT/ML injection INJECT 120 UNITS DAILY IN PUMP 100 mL 11  . Insulin Infusion Pump Supplies (PARADIGM RESERVOIR 3ML) MISC 1 Device by Does not apply route every 3 (three) days. 30 each 3  . Insulin Infusion Pump Supplies (QUICK-SET INFUSION 43" 9MM) MISC 1 Device by Does not apply route every 3 (three) days. 30 each 3  . Lancets Misc. (ACCU-CHEK MULTICLIX LANCET DEV) KIT 1 Device by Other route 5 (five) times daily as needed. 5/day dx 250.01 450 each 3  . lidocaine (LIDODERM) 5 % Place 1 patch onto the skin daily. At 7 am and remove at 7 pm. Available over the counter. 30 patch 1  . lisinopril-hydrochlorothiazide (PRINZIDE,ZESTORETIC) 10-12.5 MG tablet TAKE 1 TABLET DAILY 90 tablet 1  . Multiple Vitamin (MULTIVITAMIN) capsule Take by mouth.    . Multiple Vitamins-Minerals (MULTIVITAMIN,TX-MINERALS) tablet Take 1 tablet by mouth daily.      . Na Sulfate-K Sulfate-Mg Sulf 17.5-3.13-1.6 GM/177ML SOLN Suprep (no substitutions)-TAKE AS DIRECTED. 354 mL 0   No current facility-administered medications on file prior to visit.       No Known Allergies  Family History  Problem Relation Age of Onset  . Dementia Mother   . Diabetes Mellitus I Mother   . Hypertension Father        57  . Healthy Sister   . Rheum arthritis Brother   . Cancer Neg Hx   . Colon cancer Neg Hx   . Esophageal cancer Neg Hx   . Stomach cancer  Neg Hx   . Rectal cancer Neg Hx   . Colon polyps Neg Hx     BP 134/68   Pulse (!) 102   Temp 98.6 F (37 C) (Oral)   Ht 6' (1.829 m)   Wt 239 lb 12.8 oz (108.8 kg)   SpO2 98%   BMI 32.52 kg/m    Review of Systems He denies hypoglycemia.      Objective:   Physical Exam VITAL SIGNS:  See vs page GENERAL: no distress Pulses: foot pulses are intact bilaterally.   MSK: no deformity of the feet or ankles.  CV: no edema of the legs or ankles Skin:  no ulcer on the feet or ankles.  normal color and temp on the feet and ankles Neuro: sensation is intact to touch on the feet and ankles.    Lab Results  Component Value Date   HGBA1C 8.3 (A) 11/15/2017        Assessment & Plan:  Type 1 DM, with DR: he needs increased rx  Patient Instructions  Please take these pump settings: basal rate of 4.5 units/hr 6 AM-10 PM, and 2.7 units/hr 10 PM-6 AM mealtime bolus of 1 unit with each meal.   correction bolus (which some people call "sensitivity," or "insulin sensitivity ratio," or just "isr") of 1 unit for each 50 by which your glucose exceeds 100.  suspend the pump for 1-2 hrs, with activity.  If not, eat a light snack with it.   On this type of insulin schedule, you should eat meals on a regular schedule.  If a meal is missed or significantly delayed, your blood sugar could go low.   For the colonoscopy:  Take a basal rate of 2 units/hr, the day before and day of the colonoscopy.   Please come back for a follow-up appointment in 2 months.  We are happy to review continuous glucose monitor info in between appointments.

## 2017-11-15 NOTE — Progress Notes (Signed)
Pre visit review using our clinic review tool, if applicable. No additional management support is needed unless otherwise documented below in the visit note. 

## 2017-11-15 NOTE — Patient Instructions (Addendum)
Please take these pump settings: basal rate of 4.5 units/hr 6 AM-10 PM, and 2.7 units/hr 10 PM-6 AM mealtime bolus of 1 unit with each meal.   correction bolus (which some people call "sensitivity," or "insulin sensitivity ratio," or just "isr") of 1 unit for each 50 by which your glucose exceeds 100.  suspend the pump for 1-2 hrs, with activity.  If not, eat a light snack with it.   On this type of insulin schedule, you should eat meals on a regular schedule.  If a meal is missed or significantly delayed, your blood sugar could go low.   For the colonoscopy:  Take a basal rate of 2 units/hr, the day before and day of the colonoscopy.   Please come back for a follow-up appointment in 2 months.  We are happy to review continuous glucose monitor info in between appointments.

## 2017-11-22 ENCOUNTER — Ambulatory Visit (AMBULATORY_SURGERY_CENTER): Payer: BLUE CROSS/BLUE SHIELD | Admitting: Gastroenterology

## 2017-11-22 ENCOUNTER — Encounter: Payer: Self-pay | Admitting: Gastroenterology

## 2017-11-22 VITALS — BP 114/76 | HR 82 | Temp 98.9°F | Resp 14 | Ht 72.0 in | Wt 241.0 lb

## 2017-11-22 DIAGNOSIS — D123 Benign neoplasm of transverse colon: Secondary | ICD-10-CM | POA: Diagnosis not present

## 2017-11-22 DIAGNOSIS — D124 Benign neoplasm of descending colon: Secondary | ICD-10-CM

## 2017-11-22 DIAGNOSIS — Z1211 Encounter for screening for malignant neoplasm of colon: Secondary | ICD-10-CM

## 2017-11-22 MED ORDER — SODIUM CHLORIDE 0.9 % IV SOLN: 500.0000 mL | Freq: Once | INTRAVENOUS | Status: DC

## 2017-11-22 NOTE — Progress Notes (Signed)
Pt's states no medical or surgical changes since previsit or office visit. 

## 2017-11-22 NOTE — Progress Notes (Signed)
Recheck blood glucose 83. No s/s of hypo/hyperglycemia.

## 2017-11-22 NOTE — Patient Instructions (Signed)
Discharge instructions given. Handout on polyps. Resume previous medications. YOU HAD AN ENDOSCOPIC PROCEDURE TODAY AT THE [facility]:   Refer to the procedure report that was given to you for any specific questions about what was found during the examination.  If the procedure report does not answer your questions, please call your gastroenterologist to clarify.  If you requested that your care partner not be given the details of your procedure findings, then the procedure report has been included in a sealed envelope for you to review at your convenience later.  YOU SHOULD EXPECT: Some feelings of bloating in the abdomen. Passage of more gas than usual.  Walking can help get rid of the air that was put into your GI tract during the procedure and reduce the bloating. If you had a lower endoscopy (such as a colonoscopy or flexible sigmoidoscopy) you may notice spotting of blood in your stool or on the toilet paper. If you underwent a bowel prep for your procedure, you may not have a normal bowel movement for a few days.  Please Note:  You might notice some irritation and congestion in your nose or some drainage.  This is from the oxygen used during your procedure.  There is no need for concern and it should clear up in a day or so.  SYMPTOMS TO REPORT IMMEDIATELY:   Following lower endoscopy (colonoscopy or flexible sigmoidoscopy):  Excessive amounts of blood in the stool  Significant tenderness or worsening of abdominal pains  Swelling of the abdomen that is new, acute  Fever of 100F or higher   For urgent or emergent issues, a gastroenterologist can be reached at any hour by calling [phone].   DIET:  We do recommend a small meal at first, but then you may proceed to your regular diet.  Drink plenty of fluids but you should avoid alcoholic beverages for 24 hours.  ACTIVITY:  You should plan to take it easy for the rest of today and you should NOT DRIVE or use heavy  machinery until tomorrow (because of the sedation medicines used during the test).    FOLLOW UP: Our staff will call the number listed on your records the next business day following your procedure to check on you and address any questions or concerns that you may have regarding the information given to you following your procedure. If we do not reach you, we will leave a message.  However, if you are feeling well and you are not experiencing any problems, there is no need to return our call.  We will assume that you have returned to your regular daily activities without incident.  If any biopsies were taken you will be contacted by phone or by letter within the next 1-3 weeks.  Please call us at [phone] if you have not heard about the biopsies in 3 weeks.    SIGNATURES/CONFIDENTIALITY: You and/or your care partner have signed paperwork which will be entered into your electronic medical record.  These signatures attest to the fact that that the information above on your After Visit Summary has been reviewed and is understood.  Full responsibility of the confidentiality of this discharge information lies with you and/or your care-partner. 

## 2017-11-22 NOTE — Progress Notes (Signed)
Called to room to assist during endoscopic procedure.  Patient ID and intended procedure confirmed with present staff. Received instructions for my participation in the procedure from the performing physician.  

## 2017-11-22 NOTE — Op Note (Signed)
[place] Patient Name: Kenneth Pace Procedure Date: 11/22/2017 10:14 AM MRN: [id number] Endoscopist: Milus Banister , MD Age: 67 Referring MD:  Date of Birth: 1950/05/15 Gender: Male Account #: [id number] Procedure:                Colonoscopy Indications:              Screening for colorectal malignant neoplasm Medicines:                Monitored Anesthesia Care Procedure:                Pre-Anesthesia Assessment:                           - Prior to the procedure, a History and Physical                            was performed, and patient medications and                            allergies were reviewed. The patient's tolerance of                            previous anesthesia was also reviewed. The risks                            and benefits of the procedure and the sedation                            options and risks were discussed with the patient.                            All questions were answered, and informed consent                            was obtained. Prior Anticoagulants: The patient has                            taken no previous anticoagulant or antiplatelet                            agents. ASA Grade Assessment: II - A patient with                            mild systemic disease. After reviewing the risks                            and benefits, the patient was deemed in                            satisfactory condition to undergo the procedure.                           After obtaining informed consent, the colonoscope  was passed under direct vision. Throughout the                            procedure, the patient's blood pressure, pulse, and                            oxygen saturations were monitored continuously. The                            Colonoscope was introduced through the anus and                            advanced to the the cecum, identified by                            appendiceal orifice and  ileocecal valve. The                            colonoscopy was performed without difficulty. The                            patient tolerated the procedure well. The quality                            of the bowel preparation was good. The ileocecal                            valve, appendiceal orifice, and rectum were                            photographed. Scope In: 10:17:54 AM Scope Out: 10:43:15 AM Scope Withdrawal Time: 0 hours 13 minutes 13 seconds  Total Procedure Duration: 0 hours 25 minutes 21 seconds  Findings:                 Three sessile polyps were found in the descending                            colon and transverse colon. The polyps were 3 to 4                            mm in size. These polyps were removed with a cold                            snare. Resection and retrieval were complete.                           The exam was otherwise without abnormality on                            direct and retroflexion views. Complications:            No immediate complications. Estimated blood loss:  None. Estimated Blood Loss:     Estimated blood loss: none. Impression:               - Three 3 to 4 mm polyps in the descending colon                            and in the transverse colon, removed with a cold                            snare. Resected and retrieved.                           - The examination was otherwise normal on direct                            and retroflexion views. Recommendation:           - Patient has a contact number available for                            emergencies. The signs and symptoms of potential                            delayed complications were discussed with the                            patient. Return to normal activities tomorrow.                            Written discharge instructions were provided to the                            patient.                           - Resume previous diet.                            - Continue present medications.                           You will receive a letter within 2-3 weeks with the                            pathology results and my final recommendations.                           If the polyp(s) is proven to be 'pre-cancerous' on                            pathology, you will need repeat colonoscopy in 3-5                            years. If the polyp(s) is NOT 'precancerous' on  pathology then you should repeat colon cancer                            screening in 10 years with colonoscopy without need                            for colon cancer screening by any method prior to                            then (including stool testing). Milus Banister, MD 11/22/2017 10:45:14 AM This report has been signed electronically.

## 2017-11-22 NOTE — Progress Notes (Signed)
Report to PACU, RN, vss, BBS= Clear.  

## 2017-11-23 ENCOUNTER — Telehealth: Payer: Self-pay | Admitting: *Deleted

## 2017-11-23 NOTE — Telephone Encounter (Signed)
  Follow up Call-  Call back number 11/22/2017  Post procedure Call Back phone  # [phone]  Permission to leave phone message Yes  Some recent data might be hidden     Patient questions:  Do you have a fever, pain , or abdominal swelling? No. Pain Score  0 *  Have you tolerated food without any problems? Yes.    Have you been able to return to your normal activities? Yes.    Do you have any questions about your discharge instructions: Diet   No. Medications  No. Follow up visit  No.  Do you have questions or concerns about your Care? No.  Actions: * If pain score is 4 or above: No action needed, pain <4.

## 2017-11-24 ENCOUNTER — Telehealth: Payer: Self-pay | Admitting: Neurology

## 2017-11-24 NOTE — Telephone Encounter (Signed)
Patient's wife notified that it is ok to have the injection.

## 2017-11-24 NOTE — Telephone Encounter (Signed)
Yes, ok to have influenza immunization by injection.

## 2017-11-24 NOTE — Telephone Encounter (Signed)
Please advise 

## 2017-11-24 NOTE — Telephone Encounter (Signed)
Patients wife wanting to know if patient can have flu shot.

## 2017-11-25 ENCOUNTER — Ambulatory Visit (HOSPITAL_COMMUNITY)
Admission: RE | Admit: 2017-11-25 | Discharge: 2017-11-25 | Disposition: A | Discharge status: Home / Self Care | Payer: BLUE CROSS/BLUE SHIELD | Source: Ambulatory Visit | Attending: Neurology | Admitting: Neurology

## 2017-11-25 ENCOUNTER — Encounter (HOSPITAL_COMMUNITY): Payer: BLUE CROSS/BLUE SHIELD

## 2017-11-25 DIAGNOSIS — G6181 Chronic inflammatory demyelinating polyneuritis: Secondary | ICD-10-CM | POA: Diagnosis not present

## 2017-11-25 MED ORDER — DEXTROSE 5 % IV SOLN: INTRAVENOUS | Status: DC | PRN

## 2017-11-25 MED ORDER — IMMUNE GLOBULIN (HUMAN) 5 GM/50ML IV SOLN
1.0000 g/kg | INTRAVENOUS | Status: DC
  Filled 2017-11-25: qty 50

## 2017-11-25 MED ORDER — IMMUNE GLOBULIN (HUMAN) 5 GM/50ML IV SOLN
1.0000 g/kg | INTRAVENOUS | Status: DC
  Administered 2017-11-25: 105 g via INTRAVENOUS
  Filled 2017-11-25: qty 50

## 2017-11-26 ENCOUNTER — Encounter
Payer: BLUE CROSS/BLUE SHIELD | Attending: Physical Medicine & Rehabilitation | Admitting: Physical Medicine & Rehabilitation

## 2017-11-26 ENCOUNTER — Encounter: Payer: Self-pay | Admitting: Physical Medicine & Rehabilitation

## 2017-11-26 VITALS — BP 148/78 | HR 90 | Resp 14 | Ht 72.0 in | Wt 239.0 lb

## 2017-11-26 DIAGNOSIS — Z9889 Other specified postprocedural states: Secondary | ICD-10-CM | POA: Insufficient documentation

## 2017-11-26 DIAGNOSIS — E785 Hyperlipidemia, unspecified: Secondary | ICD-10-CM | POA: Diagnosis not present

## 2017-11-26 DIAGNOSIS — G6181 Chronic inflammatory demyelinating polyneuritis: Secondary | ICD-10-CM

## 2017-11-26 DIAGNOSIS — N400 Enlarged prostate without lower urinary tract symptoms: Secondary | ICD-10-CM | POA: Insufficient documentation

## 2017-11-26 DIAGNOSIS — Z8249 Family history of ischemic heart disease and other diseases of the circulatory system: Secondary | ICD-10-CM | POA: Diagnosis not present

## 2017-11-26 DIAGNOSIS — R531 Weakness: Secondary | ICD-10-CM | POA: Diagnosis not present

## 2017-11-26 DIAGNOSIS — Z8261 Family history of arthritis: Secondary | ICD-10-CM | POA: Diagnosis not present

## 2017-11-26 DIAGNOSIS — Z9641 Presence of insulin pump (external) (internal): Secondary | ICD-10-CM | POA: Diagnosis not present

## 2017-11-26 DIAGNOSIS — Z833 Family history of diabetes mellitus: Secondary | ICD-10-CM | POA: Diagnosis not present

## 2017-11-26 DIAGNOSIS — R269 Unspecified abnormalities of gait and mobility: Secondary | ICD-10-CM | POA: Diagnosis not present

## 2017-11-26 DIAGNOSIS — E109 Type 1 diabetes mellitus without complications: Secondary | ICD-10-CM | POA: Insufficient documentation

## 2017-11-26 DIAGNOSIS — N319 Neuromuscular dysfunction of bladder, unspecified: Secondary | ICD-10-CM | POA: Insufficient documentation

## 2017-11-26 DIAGNOSIS — M791 Myalgia, unspecified site: Secondary | ICD-10-CM

## 2017-11-26 DIAGNOSIS — M4712 Other spondylosis with myelopathy, cervical region: Secondary | ICD-10-CM

## 2017-11-26 DIAGNOSIS — Z981 Arthrodesis status: Secondary | ICD-10-CM | POA: Diagnosis not present

## 2017-11-26 DIAGNOSIS — M4802 Spinal stenosis, cervical region: Secondary | ICD-10-CM | POA: Diagnosis not present

## 2017-11-26 DIAGNOSIS — I10 Essential (primary) hypertension: Secondary | ICD-10-CM | POA: Insufficient documentation

## 2017-11-26 DIAGNOSIS — G610 Guillain-Barre syndrome: Secondary | ICD-10-CM | POA: Diagnosis not present

## 2017-11-26 DIAGNOSIS — G4733 Obstructive sleep apnea (adult) (pediatric): Secondary | ICD-10-CM | POA: Diagnosis not present

## 2017-11-26 DIAGNOSIS — G959 Disease of spinal cord, unspecified: Secondary | ICD-10-CM | POA: Diagnosis not present

## 2017-11-26 DIAGNOSIS — E119 Type 2 diabetes mellitus without complications: Secondary | ICD-10-CM

## 2017-11-26 DIAGNOSIS — N529 Male erectile dysfunction, unspecified: Secondary | ICD-10-CM | POA: Diagnosis not present

## 2017-11-26 DIAGNOSIS — M792 Neuralgia and neuritis, unspecified: Secondary | ICD-10-CM | POA: Diagnosis not present

## 2017-11-26 DIAGNOSIS — Z820 Family history of epilepsy and other diseases of the nervous system: Secondary | ICD-10-CM | POA: Diagnosis not present

## 2017-11-26 MED ORDER — LIDOCAINE 5 % EX PTCH: 2.0000 | MEDICATED_PATCH | CUTANEOUS | 0 refills | Status: AC

## 2017-11-26 MED ORDER — AMITRIPTYLINE HCL 50 MG PO TABS: 50.0000 mg | ORAL_TABLET | Freq: Every day | ORAL | 1 refills | Status: DC

## 2017-11-26 MED ORDER — MELOXICAM 15 MG PO TABS: 15.0000 mg | ORAL_TABLET | Freq: Every day | ORAL | 1 refills | Status: DC

## 2017-11-26 MED ORDER — DULOXETINE HCL 60 MG PO CPEP: 60.0000 mg | ORAL_CAPSULE | Freq: Every day | ORAL | 0 refills | Status: DC

## 2017-11-26 NOTE — Progress Notes (Addendum)
Subjective:    Patient ID: Kenneth Pace, male    DOB: August 28, 1950, 67 y.o.   MRN: [id number]  HPI 67 y.o. 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   Last clinic visit 08/27/17.  Wife present, who supplements history. Since that time, pt has significant pain.  He complains of pain mid/low back pain. He continues to IVIG from Neurology monthly.  ?Near falls, ?mechanical causes. He uses Lidoderm patches. He has not tried TENS unit recently. Denies side effects with Elavil.  Pain Inventory Average Pain 4 Pain Right Now 4 My pain is intermittent, sharp and aching  In the last 24 hours, has pain interfered with the following? General activity 4 Relation with others 0 Enjoyment of life 3 What TIME of day is your pain at its worst? morning, evening Sleep fair to good Pain is worse with: bending and some activites Pain improves with: rest and medication Relief from Meds: 7  Mobility walk with assistance use a cane ability to climb steps?  yes do you drive?  yes Do you have any goals in this area?  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  no  Physicians involved in your care Any changes since last visit?  no   Family History  Problem Relation Age of Onset  . Dementia Mother   . Diabetes Mellitus I Mother   . Hypertension Father        73  . Healthy Sister   . Rheum arthritis Brother   . Cancer Neg Hx   . Colon cancer Neg Hx   . Esophageal cancer Neg Hx   . Stomach cancer Neg Hx   . Rectal cancer Neg Hx   . Colon polyps Neg Hx    Social History   Socioeconomic History  . Marital status: Married    Spouse name: Not on file  . Number of children: Not on file  . Years of education: Not on file  . Highest  education level: Not on file  Occupational History  . 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 Financial resource strain: Not on file  . Food insecurity:    Worry: Not on file    Inability: Not on file  . Transportation needs:    Medical: Not on file    Non-medical: Not on file  Tobacco Use  . Smoking status: Never Smoker  . Smokeless tobacco: Never Used  Substance and Sexual Activity  . Alcohol use: No  . Drug use: No  . Sexual activity: Not on file  Lifestyle  . Physical activity:    Days per week: Not on file    Minutes per session: Not on file  . Stress: Not on file  Relationships  . Social connections:    Talks on phone: Not on file    Gets together: Not on file    Attends religious service: Not on file    Active member of club or organization: Not on file    Attends meetings of clubs or organizations: Not on file    Relationship status: Not on file  Other Topics Concern  . Not on file  Social History Narrative   He works for [employer] [place]   He lives at home with wife.     Highest level of education:  BS, business admin   Past Surgical History:  Procedure Laterality Date  . ANTERIOR CERVICAL DECOMP/DISCECTOMY FUSION N/A 02/01/2017   Procedure: ANTERIOR CERVICAL DECOMPRESSION/DISCECTOMY FUSION CERVICAL THREE-FOUR , CERVICAL FOUR-FIVE  CERVICAL FIVE-SIX;  Surgeon: Consuella Lose, MD;  Location: [facility];  Service: Neurosurgery;  Laterality: N/A;  . COLONOSCOPY  02/05/2010   [facility]  . KNEE SURGERY     2 Lknee arthroscopy, 3 R knee arthroscpoy  . KNEE SURGERY Right 11/12/2016  . TONSILLECTOMY     Past Medical History:  Diagnosis Date  . ADENOIDECTOMY, HX OF 02/16/2007  . Anxiety    pt. states he does not have anxiety.  Marland Kitchen BENIGN PROSTATIC HYPERTROPHY, WITH OBSTRUCTION 02/03/2010  . DIABETES MELLITUS, TYPE I, UNCONTROLLED 12/29/2006  . ERECTILE DYSFUNCTION 02/16/2007  .  HYPERLIPIDEMIA 02/16/2007  . HYPERTENSION 02/16/2007  . Sleep apnea    uses CPAP  . TESTICULAR MASS, LEFT 02/16/2007   BP (!) 148/78   Pulse 90   Resp 14   Ht 6' (1.829 m)   Wt 239 lb (108.4 kg)   SpO2 96%   BMI 32.41 kg/m   Opioid Risk Score:   Fall Risk Score:  `1  Depression screen PHQ 2/9  Depression screen [facility] 2/9 07/07/2017 02/26/2017 02/26/2017  Decreased Interest 0 0 0  Down, Depressed, Hopeless 0 0 0  PHQ - 2 Score 0 0 0  Altered sleeping - 1 -  Tired, decreased energy - 0 -  Change in appetite - 0 -  Feeling bad or failure about yourself  - 0 -  Trouble concentrating - 0 -  Moving slowly or fidgety/restless - 1 -  Suicidal thoughts - 0 -  PHQ-9 Score - 2 -  Difficult doing work/chores - Not difficult at all -   Review of Systems  HENT: Negative.   Eyes: Negative.   Respiratory: Negative.   Cardiovascular: Positive for leg swelling.  Gastrointestinal: Negative.   Endocrine: Negative.   Genitourinary: Negative.   Musculoskeletal: Positive for arthralgias, gait problem, myalgias and neck pain.  Skin: Negative.   Allergic/Immunologic: Negative.   Neurological: Positive for weakness and numbness.       Tingling   Hematological: Negative.   Psychiatric/Behavioral: Negative.   All other systems reviewed and are negative.     Objective:   Physical Exam Constitutional: He appears well-developed and well-nourished. No distress.  HENT: Normocephalic and atraumatic.  Eyes: EOM are normal. No discharge.  Neck: Right neck with honey comb dressing and edema, improving Cardiovascular: RRR. No JVD.  Respiratory: Effort normal and breath sounds normal.  GI: He exhibits no distention. BS+  Musculoskeletal: He exhibits no edema or tenderness in extremities.  +TTP b/l lumbosacral PSP Neurological: He is alert and oriented.  Motor: B/L UE 5/5 proximal to distal  B/l LE: HF 5/5, KE, ADF/PF 5/5 Sensation to light touch improving b/l feet Skin: Skin is warm and dry.  No rash noted. He is not diaphoretic. No erythema.  Psychiatric: He has a normal mood and affect. His behavior is normal. Judgment and thought content normal.     Assessment & Plan:  67 y.o. 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   1.  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  Cont HEP, encouraged gym  Cont follow up Neurology - monthly IVIG per Neurology  Cont work   2.  Pain Management:   Gabapentin and Lyrica d/ced due to edema  Cont Lidoderm patch  Cont TENS, reminded  Will increase Elavil to 50 qhs   Cont Cymbalta to 60mg  with food  Cont Baclofen per Neurology  Will order Mobic 15mg  daily with food  Encouraged trial Tylenol  Recommend pool therapy  3. Gait abnormality  Cont HEP  Cont cane  4. Mechanical low back - multifactorial  Myalgia +/- facet arthropathy  MRI L-spine reviewed, multilevel predominantly foraminal and facet arthropathy  Cont Balcofen per Neurology  Cont Cymbalta   Will consider facet injections in future if necessary   5. Myalgia  Will schedule for trigger point injection

## 2017-12-02 ENCOUNTER — Encounter: Payer: Self-pay | Admitting: Gastroenterology

## 2017-12-02 DIAGNOSIS — E109 Type 1 diabetes mellitus without complications: Secondary | ICD-10-CM | POA: Diagnosis not present

## 2017-12-03 ENCOUNTER — Encounter (HOSPITAL_BASED_OUTPATIENT_CLINIC_OR_DEPARTMENT_OTHER): Payer: BLUE CROSS/BLUE SHIELD | Admitting: Physical Medicine & Rehabilitation

## 2017-12-03 ENCOUNTER — Encounter: Payer: Self-pay | Admitting: Physical Medicine & Rehabilitation

## 2017-12-03 VITALS — BP 152/88 | HR 82 | Ht 72.0 in | Wt 238.2 lb

## 2017-12-03 DIAGNOSIS — G959 Disease of spinal cord, unspecified: Secondary | ICD-10-CM | POA: Diagnosis not present

## 2017-12-03 DIAGNOSIS — E109 Type 1 diabetes mellitus without complications: Secondary | ICD-10-CM | POA: Diagnosis not present

## 2017-12-03 DIAGNOSIS — M791 Myalgia, unspecified site: Secondary | ICD-10-CM | POA: Diagnosis not present

## 2017-12-03 DIAGNOSIS — N319 Neuromuscular dysfunction of bladder, unspecified: Secondary | ICD-10-CM | POA: Diagnosis not present

## 2017-12-03 DIAGNOSIS — Z833 Family history of diabetes mellitus: Secondary | ICD-10-CM | POA: Diagnosis not present

## 2017-12-03 DIAGNOSIS — Z981 Arthrodesis status: Secondary | ICD-10-CM | POA: Diagnosis not present

## 2017-12-03 DIAGNOSIS — M4802 Spinal stenosis, cervical region: Secondary | ICD-10-CM | POA: Diagnosis not present

## 2017-12-03 DIAGNOSIS — E785 Hyperlipidemia, unspecified: Secondary | ICD-10-CM | POA: Diagnosis not present

## 2017-12-03 DIAGNOSIS — I10 Essential (primary) hypertension: Secondary | ICD-10-CM | POA: Diagnosis not present

## 2017-12-03 DIAGNOSIS — R531 Weakness: Secondary | ICD-10-CM | POA: Diagnosis not present

## 2017-12-03 DIAGNOSIS — R269 Unspecified abnormalities of gait and mobility: Secondary | ICD-10-CM | POA: Diagnosis not present

## 2017-12-03 DIAGNOSIS — Z9641 Presence of insulin pump (external) (internal): Secondary | ICD-10-CM | POA: Diagnosis not present

## 2017-12-03 DIAGNOSIS — Z9889 Other specified postprocedural states: Secondary | ICD-10-CM | POA: Diagnosis not present

## 2017-12-03 DIAGNOSIS — G4733 Obstructive sleep apnea (adult) (pediatric): Secondary | ICD-10-CM | POA: Diagnosis not present

## 2017-12-03 DIAGNOSIS — G610 Guillain-Barre syndrome: Secondary | ICD-10-CM | POA: Diagnosis not present

## 2017-12-03 DIAGNOSIS — N529 Male erectile dysfunction, unspecified: Secondary | ICD-10-CM | POA: Diagnosis not present

## 2017-12-03 DIAGNOSIS — N400 Enlarged prostate without lower urinary tract symptoms: Secondary | ICD-10-CM | POA: Diagnosis not present

## 2017-12-03 NOTE — Progress Notes (Signed)
Trigger point injection procedure note: Trigger Point Injection: Written consent was obtained for the patient. Trigger points were identified of bilateral lumbosacral muscles. The areas were cleaned with alcohol, vapocoolant spray applied, needle drawback performed, and each of  these trigger points were injected with 1 cc of 0.5% Marcaine (x5). There were no complications from the procedure, and it was well tolerated.

## 2017-12-10 ENCOUNTER — Encounter: Payer: Self-pay | Admitting: Physical Medicine & Rehabilitation

## 2017-12-10 ENCOUNTER — Other Ambulatory Visit: Payer: Self-pay

## 2017-12-10 ENCOUNTER — Encounter (HOSPITAL_BASED_OUTPATIENT_CLINIC_OR_DEPARTMENT_OTHER): Payer: BLUE CROSS/BLUE SHIELD | Admitting: Physical Medicine & Rehabilitation

## 2017-12-10 VITALS — BP 114/80 | HR 100 | Ht 72.0 in | Wt 241.8 lb

## 2017-12-10 DIAGNOSIS — G4733 Obstructive sleep apnea (adult) (pediatric): Secondary | ICD-10-CM | POA: Diagnosis not present

## 2017-12-10 DIAGNOSIS — Z9641 Presence of insulin pump (external) (internal): Secondary | ICD-10-CM | POA: Diagnosis not present

## 2017-12-10 DIAGNOSIS — E109 Type 1 diabetes mellitus without complications: Secondary | ICD-10-CM | POA: Diagnosis not present

## 2017-12-10 DIAGNOSIS — M4802 Spinal stenosis, cervical region: Secondary | ICD-10-CM | POA: Diagnosis not present

## 2017-12-10 DIAGNOSIS — Z833 Family history of diabetes mellitus: Secondary | ICD-10-CM | POA: Diagnosis not present

## 2017-12-10 DIAGNOSIS — R531 Weakness: Secondary | ICD-10-CM | POA: Diagnosis not present

## 2017-12-10 DIAGNOSIS — G610 Guillain-Barre syndrome: Secondary | ICD-10-CM | POA: Diagnosis not present

## 2017-12-10 DIAGNOSIS — R269 Unspecified abnormalities of gait and mobility: Secondary | ICD-10-CM | POA: Diagnosis not present

## 2017-12-10 DIAGNOSIS — N400 Enlarged prostate without lower urinary tract symptoms: Secondary | ICD-10-CM | POA: Diagnosis not present

## 2017-12-10 DIAGNOSIS — M791 Myalgia, unspecified site: Secondary | ICD-10-CM | POA: Diagnosis not present

## 2017-12-10 DIAGNOSIS — N529 Male erectile dysfunction, unspecified: Secondary | ICD-10-CM | POA: Diagnosis not present

## 2017-12-10 DIAGNOSIS — Z9889 Other specified postprocedural states: Secondary | ICD-10-CM | POA: Diagnosis not present

## 2017-12-10 DIAGNOSIS — Z981 Arthrodesis status: Secondary | ICD-10-CM | POA: Diagnosis not present

## 2017-12-10 DIAGNOSIS — E785 Hyperlipidemia, unspecified: Secondary | ICD-10-CM | POA: Diagnosis not present

## 2017-12-10 DIAGNOSIS — I10 Essential (primary) hypertension: Secondary | ICD-10-CM | POA: Diagnosis not present

## 2017-12-10 DIAGNOSIS — N319 Neuromuscular dysfunction of bladder, unspecified: Secondary | ICD-10-CM | POA: Diagnosis not present

## 2017-12-10 DIAGNOSIS — G959 Disease of spinal cord, unspecified: Secondary | ICD-10-CM | POA: Diagnosis not present

## 2017-12-10 NOTE — Progress Notes (Signed)
Trigger point injection procedure note: Trigger Point Injection: Written consent was obtained for the patient. Trigger points were identified of bilateral lumbosacral muscles (x5). The areas were cleaned with alcohol, vapocoolant spray applied, needle drawback performed, and each of  these trigger points were injected with 1 cc of 0.5% Marcaine. There were no complications from the procedure, and it was well tolerated.

## 2017-12-18 ENCOUNTER — Other Ambulatory Visit: Payer: Self-pay | Admitting: Endocrinology

## 2017-12-23 ENCOUNTER — Ambulatory Visit (HOSPITAL_COMMUNITY)
Admission: RE | Admit: 2017-12-23 | Discharge: 2017-12-23 | Disposition: A | Discharge status: Home / Self Care | Payer: BLUE CROSS/BLUE SHIELD | Source: Ambulatory Visit | Attending: Neurology | Admitting: Neurology

## 2017-12-23 DIAGNOSIS — G6181 Chronic inflammatory demyelinating polyneuritis: Secondary | ICD-10-CM

## 2017-12-23 MED ORDER — IMMUNE GLOBULIN (HUMAN) 5 GM/50ML IV SOLN
1.0000 g/kg | INTRAVENOUS | Status: DC
  Filled 2017-12-23: qty 50

## 2017-12-23 MED ORDER — DEXTROSE 5 % IV SOLN
INTRAVENOUS | Status: DC | PRN
  Administered 2017-12-23: 10:00:00 via INTRAVENOUS

## 2017-12-23 MED ORDER — IMMUNE GLOBULIN (HUMAN) 5 GM/50ML IV SOLN
105.0000 g | Freq: Once | INTRAVENOUS | Status: AC
  Administered 2017-12-23: 105 g via INTRAVENOUS
  Filled 2017-12-23: qty 50

## 2017-12-30 ENCOUNTER — Encounter
Payer: BLUE CROSS/BLUE SHIELD | Attending: Physical Medicine & Rehabilitation | Admitting: Physical Medicine & Rehabilitation

## 2017-12-30 ENCOUNTER — Encounter: Payer: Self-pay | Admitting: Physical Medicine & Rehabilitation

## 2017-12-30 VITALS — BP 148/99 | HR 85 | Resp 14 | Ht 72.0 in | Wt 243.0 lb

## 2017-12-30 DIAGNOSIS — Z9641 Presence of insulin pump (external) (internal): Secondary | ICD-10-CM | POA: Insufficient documentation

## 2017-12-30 DIAGNOSIS — G6181 Chronic inflammatory demyelinating polyneuritis: Secondary | ICD-10-CM | POA: Diagnosis not present

## 2017-12-30 DIAGNOSIS — Z9889 Other specified postprocedural states: Secondary | ICD-10-CM | POA: Diagnosis not present

## 2017-12-30 DIAGNOSIS — I10 Essential (primary) hypertension: Secondary | ICD-10-CM | POA: Insufficient documentation

## 2017-12-30 DIAGNOSIS — G4733 Obstructive sleep apnea (adult) (pediatric): Secondary | ICD-10-CM | POA: Diagnosis not present

## 2017-12-30 DIAGNOSIS — Z820 Family history of epilepsy and other diseases of the nervous system: Secondary | ICD-10-CM | POA: Insufficient documentation

## 2017-12-30 DIAGNOSIS — Z8249 Family history of ischemic heart disease and other diseases of the circulatory system: Secondary | ICD-10-CM | POA: Diagnosis not present

## 2017-12-30 DIAGNOSIS — M545 Low back pain: Secondary | ICD-10-CM

## 2017-12-30 DIAGNOSIS — G8929 Other chronic pain: Secondary | ICD-10-CM

## 2017-12-30 DIAGNOSIS — N400 Enlarged prostate without lower urinary tract symptoms: Secondary | ICD-10-CM | POA: Insufficient documentation

## 2017-12-30 DIAGNOSIS — R531 Weakness: Secondary | ICD-10-CM | POA: Insufficient documentation

## 2017-12-30 DIAGNOSIS — Z8261 Family history of arthritis: Secondary | ICD-10-CM | POA: Insufficient documentation

## 2017-12-30 DIAGNOSIS — Z981 Arthrodesis status: Secondary | ICD-10-CM | POA: Insufficient documentation

## 2017-12-30 DIAGNOSIS — E785 Hyperlipidemia, unspecified: Secondary | ICD-10-CM | POA: Diagnosis not present

## 2017-12-30 DIAGNOSIS — R269 Unspecified abnormalities of gait and mobility: Secondary | ICD-10-CM | POA: Diagnosis not present

## 2017-12-30 DIAGNOSIS — G610 Guillain-Barre syndrome: Secondary | ICD-10-CM | POA: Insufficient documentation

## 2017-12-30 DIAGNOSIS — E109 Type 1 diabetes mellitus without complications: Secondary | ICD-10-CM | POA: Diagnosis not present

## 2017-12-30 DIAGNOSIS — G959 Disease of spinal cord, unspecified: Secondary | ICD-10-CM | POA: Insufficient documentation

## 2017-12-30 DIAGNOSIS — M792 Neuralgia and neuritis, unspecified: Secondary | ICD-10-CM | POA: Diagnosis not present

## 2017-12-30 DIAGNOSIS — M4802 Spinal stenosis, cervical region: Secondary | ICD-10-CM | POA: Insufficient documentation

## 2017-12-30 DIAGNOSIS — N529 Male erectile dysfunction, unspecified: Secondary | ICD-10-CM | POA: Diagnosis not present

## 2017-12-30 DIAGNOSIS — Z833 Family history of diabetes mellitus: Secondary | ICD-10-CM | POA: Insufficient documentation

## 2017-12-30 DIAGNOSIS — N319 Neuromuscular dysfunction of bladder, unspecified: Secondary | ICD-10-CM | POA: Diagnosis not present

## 2017-12-30 MED ORDER — DICLOFENAC SODIUM 50 MG PO TBEC: 50.0000 mg | DELAYED_RELEASE_TABLET | Freq: Two times a day (BID) | ORAL | 1 refills | Status: DC | PRN

## 2017-12-30 MED ORDER — DICLOFENAC POTASSIUM 50 MG PO TABS: 50.0000 mg | ORAL_TABLET | Freq: Three times a day (TID) | ORAL | 1 refills | Status: DC

## 2017-12-30 MED ORDER — AMITRIPTYLINE HCL 75 MG PO TABS: 75.0000 mg | ORAL_TABLET | Freq: Every day | ORAL | 1 refills | Status: DC

## 2017-12-30 NOTE — Progress Notes (Signed)
Subjective:    Patient ID: Kenneth Pace, male    DOB: 07-06-1950, 67 y.o.   MRN: [id number]  HPI 67 y.o. 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   Last clinic visit 11/26/17.  At that time, pt had trigger point injections.  He notes benefit in back pain.  He continues HEP. He continues with IVIG.  He has not retried TENS. He had good benefit with Elavil.  Continues to use Lidoderm occasionally. He occasionally uses Mobic. He taken Tylenol.  He has not tried pool therapy yet.  Occasionally uses cane, but keeps it with him.  Denies falls.  Pain Inventory Average Pain 3 Pain Right Now 3 My pain is intermittent, dull and stabbing  In the last 24 hours, has pain interfered with the following? General activity 2 Relation with others 1 Enjoyment of life 1 What TIME of day is your pain at its worst? evening, evening Sleep?   Good Pain is worse with: bending and some activites Pain improves with: medication and injections Relief from Meds: 6  Mobility walk with assistance use a cane how many minutes can you walk? 30-45 ability to climb steps?  yes do you drive?  yes  Function employed # of hrs/week 40+  Neuro/Psych tingling  Prior Studies Any changes since last visit?  no  Physicians involved in your care Any changes since last visit?  no   Family History  Problem Relation Age of Onset  . Dementia Mother   . Diabetes Mellitus I Mother   . Hypertension Father        83  . Healthy Sister   . Rheum arthritis Brother   . Cancer Neg Hx   . Colon cancer Neg Hx   . Esophageal cancer Neg Hx   . Stomach cancer Neg Hx   . Rectal cancer Neg Hx   . Colon polyps Neg Hx    Social History   Socioeconomic History  . Marital status: Married    Spouse name: Not on file  . Number of children: Not on file  . Years of  education: Not on file  . Highest education level: Not on file  Occupational History  . 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 Financial resource strain: Not on file  . Food insecurity:    Worry: Not on file    Inability: Not on file  . Transportation needs:    Medical: Not on file    Non-medical: Not on file  Tobacco Use  . Smoking status: Never Smoker  . Smokeless tobacco: Never Used  Substance and Sexual Activity  . Alcohol use: No  . Drug use: No  . Sexual activity: Not on file  Lifestyle  . Physical activity:    Days per week: Not on file    Minutes per session: Not on file  . Stress: Not on file  Relationships  . Social connections:    Talks on phone: Not on file    Gets together: Not on file    Attends religious service: Not on file    Active member of club or organization: Not on file    Attends meetings of clubs or organizations: Not on file    Relationship status: Not on file  Other Topics Concern  . Not on  file  Social History Narrative   He works for [employer]   He lives at home with wife.     Highest level of education:  BS, business admin   Past Surgical History:  Procedure Laterality Date  . ANTERIOR CERVICAL DECOMP/DISCECTOMY FUSION N/A 02/01/2017   Procedure: ANTERIOR CERVICAL DECOMPRESSION/DISCECTOMY FUSION CERVICAL THREE-FOUR , CERVICAL FOUR-FIVE  CERVICAL FIVE-SIX;  Surgeon: Consuella Lose, MD;  Location: [facility];  Service: Neurosurgery;  Laterality: N/A;  . COLONOSCOPY  02/05/2010   [facility]  . KNEE SURGERY     2 Lknee arthroscopy, 3 R knee arthroscpoy  . KNEE SURGERY Right 11/12/2016  . TONSILLECTOMY     Past Medical History:  Diagnosis Date  . ADENOIDECTOMY, HX OF 02/16/2007  . Anxiety    pt. states he does not have anxiety.  Marland Kitchen BENIGN PROSTATIC HYPERTROPHY, WITH OBSTRUCTION 02/03/2010  . DIABETES MELLITUS, TYPE I, UNCONTROLLED 12/29/2006  .  ERECTILE DYSFUNCTION 02/16/2007  . HYPERLIPIDEMIA 02/16/2007  . HYPERTENSION 02/16/2007  . Sleep apnea    uses CPAP  . TESTICULAR MASS, LEFT 02/16/2007   BP (!) 148/99   Pulse 85   Resp 14   Ht 6' (1.829 m)   Wt 243 lb (110.2 kg)   SpO2 96%   BMI 32.96 kg/m   Opioid Risk Score:   Fall Risk Score:  `1  Depression screen PHQ 2/9  Depression screen [facility] 2/9 12/10/2017 07/07/2017 02/26/2017 02/26/2017  Decreased Interest 0 0 0 0  Down, Depressed, Hopeless 0 0 0 0  PHQ - 2 Score 0 0 0 0  Altered sleeping - - 1 -  Tired, decreased energy - - 0 -  Change in appetite - - 0 -  Feeling bad or failure about yourself  - - 0 -  Trouble concentrating - - 0 -  Moving slowly or fidgety/restless - - 1 -  Suicidal thoughts - - 0 -  PHQ-9 Score - - 2 -  Difficult doing work/chores - - Not difficult at all -   Review of Systems  HENT: Negative.   Eyes: Negative.   Respiratory: Negative.   Cardiovascular: Positive for leg swelling.  Gastrointestinal: Negative.   Endocrine: Negative.   Genitourinary: Negative.   Musculoskeletal: Positive for arthralgias, gait problem, myalgias and neck pain.  Skin: Negative.   Allergic/Immunologic: Negative.   Neurological:       Tingling   Hematological: Negative.   Psychiatric/Behavioral: Negative.   All other systems reviewed and are negative.     Objective:   Physical Exam Constitutional: He appears well-developed and well-nourished. No distress.  HENT: Normocephalic and atraumatic.  Eyes: EOM are normal. No discharge.  Neck: Right neck with honey comb dressing and edema, improving Cardiovascular: RRR. No JVD.  Respiratory: Effort normal and breath sounds normal.  GI: He exhibits no distention. BS+  Musculoskeletal: He exhibits no edema or tenderness in extremities.  +Mild TTP b/l lumbosacral PSP Neurological: He is alert and oriented.  Motor: B/L UE 5/5 proximal to distal  B/l LE: HF 5/5, KE, ADF/PF 5/5 Sensation to light touch  improving b/l feet Skin: Skin is warm and dry. No rash noted. He is not diaphoretic. No erythema.  Psychiatric: He has a normal mood and affect. His behavior is normal. Judgment and thought content normal.     Assessment & Plan:  67 y.o. 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   1.  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  Cont HEP, encouraged gym again  Cont follow up Neurology - monthly IVIG per Neurology  Cont work   2.  Pain Management:   Gabapentin and Lyrica d/ced due to edema  Using Mobic occasionally   Cont Lidoderm patch  Cont TENS, reminded again  Will increase Elavil to 75 qhs   Cont Cymbalta to 60mg  with food  Cont Baclofen per Neurology  Cont Tylenol  Will Diclofenac 50 TID PRN  Recommend pool therapy  3. Gait abnormality  Cont HEP  Cont cane  4. Mechanical low back - multifactorial  Myalgia +/- facet arthropathy  MRI L-spine reviewed, multilevel predominantly foraminal and facet arthropathy  Cont Balcofen per Neurology  Cont Cymbalta   Will consider facet injections in future if necessary   5. Myalgia  Good benefit with TP injections  See #1

## 2017-12-31 DIAGNOSIS — G4733 Obstructive sleep apnea (adult) (pediatric): Secondary | ICD-10-CM | POA: Diagnosis not present

## 2018-01-19 ENCOUNTER — Other Ambulatory Visit: Payer: Self-pay | Admitting: *Deleted

## 2018-01-19 DIAGNOSIS — G6181 Chronic inflammatory demyelinating polyneuritis: Secondary | ICD-10-CM

## 2018-01-20 ENCOUNTER — Encounter: Payer: Self-pay | Admitting: Endocrinology

## 2018-01-20 ENCOUNTER — Ambulatory Visit (HOSPITAL_COMMUNITY)
Admission: RE | Admit: 2018-01-20 | Discharge: 2018-01-20 | Disposition: A | Discharge status: Home / Self Care | Payer: BLUE CROSS/BLUE SHIELD | Source: Ambulatory Visit | Attending: Neurology | Admitting: Neurology

## 2018-01-20 ENCOUNTER — Ambulatory Visit (INDEPENDENT_AMBULATORY_CARE_PROVIDER_SITE_OTHER): Payer: BLUE CROSS/BLUE SHIELD | Admitting: Endocrinology

## 2018-01-20 VITALS — BP 148/84 | HR 87 | Ht 72.0 in | Wt 246.2 lb

## 2018-01-20 DIAGNOSIS — G6181 Chronic inflammatory demyelinating polyneuritis: Secondary | ICD-10-CM | POA: Diagnosis not present

## 2018-01-20 DIAGNOSIS — E119 Type 2 diabetes mellitus without complications: Secondary | ICD-10-CM

## 2018-01-20 LAB — POCT GLYCOSYLATED HEMOGLOBIN (HGB A1C): HEMOGLOBIN A1C: 7.1 % — AB (ref 4.0–5.6)

## 2018-01-20 MED ORDER — SCOPOLAMINE 1 MG/3DAYS TD PT72: 1.0000 | MEDICATED_PATCH | TRANSDERMAL | 12 refills | Status: DC

## 2018-01-20 MED ORDER — IMMUNE GLOBULIN (HUMAN) 20 GM/200ML IV SOLN
1.0000 g/kg | Freq: Once | INTRAVENOUS | Status: AC
  Administered 2018-01-20: 110 g via INTRAVENOUS
  Filled 2018-01-20: qty 100

## 2018-01-20 NOTE — Patient Instructions (Addendum)
Please take these pump settings: basal rate of 4.4 units/hr 6 AM-10 PM, and 2.7 units/hr 10 PM-6 AM mealtime bolus of 1 unit with each meal.   correction bolus (which some people call "sensitivity," or "insulin sensitivity ratio," or just "isr") of 1 unit for each 50 by which your glucose exceeds 100.  suspend the pump for 1-2 hrs, with activity.  If not, eat a light snack with it.   On this type of insulin schedule, you should eat meals on a regular schedule.  If a meal is missed or significantly delayed, your blood sugar could go low.   Please come back for a follow-up appointment in 3 months.  We are happy to review continuous glucose monitor info in between appointments.  I have sent a prescription to your pharmacy, for the seasickness patches.

## 2018-01-20 NOTE — Progress Notes (Signed)
Subjective:    Patient ID: Kenneth Pace, male    DOB: 04/24/1950, 67 y.o.   MRN: [id number]  HPI Pt returns for f/u of diabetes mellitus:  DM type: 1 Dx'ed: 1696 Complications: retinopathy and polyneuropathy.   Therapy: insulin since dx.  DKA: only at dx.   Severe hypoglycemia: most recently in 2014.  Pancreatitis: never Other: he has a medtronic pump and continuous glucose monitor; he is still on short-term disability.  continuous glucose monitor ID is [email or web address] and password is [email or web address].  Interval history:  Please continue these pump settings: basal rate of 4.5 units/hr 6 AM-10 PM, and 2.7 units/hr 10 PM-6 AM mealtime bolus of 1 unit with each meal.   correction bolus (which some people call "sensitivity," or "insulin sensitivity ratio," or just "isr") of 1 unit for each 50 by which your glucose exceeds 100.  suspend the pump for 1-2 hrs, with activity.  If not, eats a light snack with it.   pt states he feels well in general.  In particular, leg strength is improving.  No pump or continuous glucose monitor problems.  I reviewed continuous glucose monitor data.  Glucose varies from 50-400, but most are in the 100's.  It is in general lowest noon-4 PM He will take a cruise soon. Past Medical History:  Diagnosis Date  . ADENOIDECTOMY, HX OF 02/16/2007  . Anxiety    pt. states he does not have anxiety.  Marland Kitchen BENIGN PROSTATIC HYPERTROPHY, WITH OBSTRUCTION 02/03/2010  . DIABETES MELLITUS, TYPE I, UNCONTROLLED 12/29/2006  . ERECTILE DYSFUNCTION 02/16/2007  . HYPERLIPIDEMIA 02/16/2007  . HYPERTENSION 02/16/2007  . Sleep apnea    uses CPAP  . TESTICULAR MASS, LEFT 02/16/2007    Past Surgical History:  Procedure Laterality Date  . ANTERIOR CERVICAL DECOMP/DISCECTOMY FUSION N/A 02/01/2017   Procedure: ANTERIOR CERVICAL DECOMPRESSION/DISCECTOMY FUSION CERVICAL THREE-FOUR , CERVICAL FOUR-FIVE  CERVICAL FIVE-SIX;  Surgeon: Consuella Lose, MD;  Location: [place];  Service:  Neurosurgery;  Laterality: N/A;  . COLONOSCOPY  02/05/2010   [facility]  . KNEE SURGERY     2 Lknee arthroscopy, 3 R knee arthroscpoy  . KNEE SURGERY Right 11/12/2016  . TONSILLECTOMY      Social History   Socioeconomic History  . Marital status: Married    Spouse name: Not on file  . Number of children: Not on file  . Years of education: Not on file  . Highest education level: Not on file  Occupational History  . Occupation: Lobbyist: [employer]    Comment: Retail buyer  Social Needs  . Financial resource strain: Not on file  . Food insecurity:    Worry: Not on file    Inability: Not on file  . Transportation needs:    Medical: Not on file    Non-medical: Not on file  Tobacco Use  . Smoking status: Never Smoker  . Smokeless tobacco: Never Used  Substance and Sexual Activity  . Alcohol use: No  . Drug use: No  . Sexual activity: Not on file  Lifestyle  . Physical activity:    Days per week: Not on file    Minutes per session: Not on file  . Stress: Not on file  Relationships  . Social connections:    Talks on phone: Not on file    Gets together: Not on file    Attends religious service: Not on file    Active member of club or organization: Not on file  Attends meetings of clubs or organizations: Not on file    Relationship status: Not on file  . Intimate partner violence:    Fear of current or ex partner: Not on file    Emotionally abused: Not on file    Physically abused: Not on file    Forced sexual activity: Not on file  Other Topics Concern  . Not on file  Social History Narrative   He works for [employer]   He lives at home with wife.     Highest level of education:  BS, business admin    Current Outpatient Medications on File Prior to Visit  Medication Sig Dispense Refill  . acetaminophen (TYLENOL) 325 MG tablet Take 2 tablets (650 mg total) by mouth every 6 (six) hours as needed for mild pain or  headache.    Marland Kitchen amitriptyline (ELAVIL) 75 MG tablet Take 1 tablet (75 mg total) by mouth at bedtime. 30 tablet 1  . B Complex Vitamins (B-COMPLEX/B-12 PO) Take by mouth.    . baclofen (LIORESAL) 10 MG tablet Take 1 tablet in the morning, 1 tablet in the afternoon, and 2 tablet at bedtime. 360 each 3  . busPIRone (BUSPAR) 30 MG tablet TAKE ONE-HALF (1/2) TABLET (15 MG) TWICE A DAY 180 tablet 1  . Continuous Glucose Monitor Sup (ENLITE GLUCOSE SENSOR) MISC 1 Device by Does not apply route every 3 (three) days. 30 each 3  . Cranberry 400 MG TABS Take 2 each by mouth daily.      . Cyanocobalamin (VITAMIN B-12 CR PO) Take 1 each by mouth daily.      . diclofenac (VOLTAREN) 50 MG EC tablet Take 1 tablet (50 mg total) by mouth 2 (two) times daily as needed. 60 tablet 1  . DULoxetine (CYMBALTA) 60 MG capsule Take 1 capsule (60 mg total) by mouth daily. 90 capsule 0  . finasteride (PROSCAR) 5 MG tablet Take 5 mg by mouth daily.     Marland Kitchen glucagon 1 MG injection Inject 1 mg into the vein once as needed. 1 each 12  . glucose blood (BAYER CONTOUR NEXT TEST) test strip Use to check blood sugar 5 times per day dx 250.01, 500 each 3  . HUMALOG 100 UNIT/ML injection INJECT 120 UNITS DAILY IN PUMP 100 mL 4  . Insulin Infusion Pump Supplies (PARADIGM RESERVOIR 3ML) MISC 1 Device by Does not apply route every 3 (three) days. 30 each 3  . Insulin Infusion Pump Supplies (QUICK-SET INFUSION 43" 9MM) MISC 1 Device by Does not apply route every 3 (three) days. 30 each 3  . Lancets Misc. (ACCU-CHEK MULTICLIX LANCET DEV) KIT 1 Device by Other route 5 (five) times daily as needed. 5/day dx 250.01 450 each 3  . lidocaine (LIDODERM) 5 % Place 2 patches onto the skin daily. At 7 am and remove at 7 pm. Available over the counter. 180 patch 0  . lisinopril-hydrochlorothiazide (PRINZIDE,ZESTORETIC) 10-12.5 MG tablet TAKE 1 TABLET DAILY 90 tablet 1  . Multiple Vitamin (MULTIVITAMIN) capsule Take by mouth.    . Multiple  Vitamins-Minerals (MULTIVITAMIN,TX-MINERALS) tablet Take 1 tablet by mouth daily.       No current facility-administered medications on file prior to visit.     No Known Allergies  Family History  Problem Relation Age of Onset  . Dementia Mother   . Diabetes Mellitus I Mother   . Hypertension Father        42  . Healthy Sister   .  Rheum arthritis Brother   . Cancer Neg Hx   . Colon cancer Neg Hx   . Esophageal cancer Neg Hx   . Stomach cancer Neg Hx   . Rectal cancer Neg Hx   . Colon polyps Neg Hx     BP (!) 148/84 (BP Location: Left Arm, Patient Position: Sitting, Cuff Size: Large)   Pulse 87   Ht 6' (1.829 m)   Wt 246 lb 3.2 oz (111.7 kg)   SpO2 94%   BMI 33.39 kg/m   Review of Systems Denies LOC.      Objective:   Physical Exam VITAL SIGNS:  See vs page GENERAL: no distress Pulses: dorsalis pedis intact bilat.   MSK: no deformity of the feet CV: 1+ bilat leg edema Skin:  no ulcer on the feet.  normal color and temp on the feet. Neuro: sensation is intact to touch on the feet Ext: There is bilateral onychomycosis of the toenails.   Lab Results  Component Value Date   HGBA1C 7.1 (A) 01/20/2018       Assessment & Plan:  Type 1 DM, with DR: well-controlled Hypoglycemia: this limits aggressiveness of glycemic control.  Upcoming travel.   Patient Instructions  Please take these pump settings: basal rate of 4.4 units/hr 6 AM-10 PM, and 2.7 units/hr 10 PM-6 AM mealtime bolus of 1 unit with each meal.   correction bolus (which some people call "sensitivity," or "insulin sensitivity ratio," or just "isr") of 1 unit for each 50 by which your glucose exceeds 100.  suspend the pump for 1-2 hrs, with activity.  If not, eat a light snack with it.   On this type of insulin schedule, you should eat meals on a regular schedule.  If a meal is missed or significantly delayed, your blood sugar could go low.   Please come back for a follow-up appointment in 3 months.  We  are happy to review continuous glucose monitor info in between appointments.  I have sent a prescription to your pharmacy, for the seasickness patches.

## 2018-01-28 ENCOUNTER — Encounter: Payer: BLUE CROSS/BLUE SHIELD | Admitting: Physical Medicine & Rehabilitation

## 2018-02-02 DIAGNOSIS — M4802 Spinal stenosis, cervical region: Secondary | ICD-10-CM | POA: Diagnosis not present

## 2018-02-03 ENCOUNTER — Other Ambulatory Visit: Payer: Self-pay

## 2018-02-03 DIAGNOSIS — E119 Type 2 diabetes mellitus without complications: Secondary | ICD-10-CM

## 2018-02-03 MED ORDER — GLUCOSE BLOOD VI STRP: ORAL_STRIP | 3 refills | Status: DC

## 2018-02-10 ENCOUNTER — Encounter
Payer: BLUE CROSS/BLUE SHIELD | Attending: Physical Medicine & Rehabilitation | Admitting: Physical Medicine & Rehabilitation

## 2018-02-10 ENCOUNTER — Telehealth: Payer: Self-pay | Admitting: Physical Medicine & Rehabilitation

## 2018-02-10 ENCOUNTER — Encounter: Payer: Self-pay | Admitting: Physical Medicine & Rehabilitation

## 2018-02-10 VITALS — BP 161/92 | HR 84 | Ht 72.0 in | Wt 243.0 lb

## 2018-02-10 DIAGNOSIS — G8929 Other chronic pain: Secondary | ICD-10-CM

## 2018-02-10 DIAGNOSIS — M545 Low back pain, unspecified: Secondary | ICD-10-CM

## 2018-02-10 DIAGNOSIS — Z8261 Family history of arthritis: Secondary | ICD-10-CM | POA: Diagnosis not present

## 2018-02-10 DIAGNOSIS — N529 Male erectile dysfunction, unspecified: Secondary | ICD-10-CM | POA: Diagnosis not present

## 2018-02-10 DIAGNOSIS — R269 Unspecified abnormalities of gait and mobility: Secondary | ICD-10-CM | POA: Diagnosis not present

## 2018-02-10 DIAGNOSIS — N400 Enlarged prostate without lower urinary tract symptoms: Secondary | ICD-10-CM | POA: Diagnosis not present

## 2018-02-10 DIAGNOSIS — M4802 Spinal stenosis, cervical region: Secondary | ICD-10-CM | POA: Diagnosis not present

## 2018-02-10 DIAGNOSIS — G6181 Chronic inflammatory demyelinating polyneuritis: Secondary | ICD-10-CM | POA: Diagnosis not present

## 2018-02-10 DIAGNOSIS — G4733 Obstructive sleep apnea (adult) (pediatric): Secondary | ICD-10-CM | POA: Insufficient documentation

## 2018-02-10 DIAGNOSIS — N319 Neuromuscular dysfunction of bladder, unspecified: Secondary | ICD-10-CM | POA: Diagnosis not present

## 2018-02-10 DIAGNOSIS — Z9889 Other specified postprocedural states: Secondary | ICD-10-CM | POA: Insufficient documentation

## 2018-02-10 DIAGNOSIS — E1065 Type 1 diabetes mellitus with hyperglycemia: Secondary | ICD-10-CM

## 2018-02-10 DIAGNOSIS — G610 Guillain-Barre syndrome: Secondary | ICD-10-CM | POA: Diagnosis not present

## 2018-02-10 DIAGNOSIS — E109 Type 1 diabetes mellitus without complications: Secondary | ICD-10-CM | POA: Diagnosis not present

## 2018-02-10 DIAGNOSIS — M4712 Other spondylosis with myelopathy, cervical region: Secondary | ICD-10-CM

## 2018-02-10 DIAGNOSIS — G959 Disease of spinal cord, unspecified: Secondary | ICD-10-CM

## 2018-02-10 DIAGNOSIS — I10 Essential (primary) hypertension: Secondary | ICD-10-CM | POA: Diagnosis not present

## 2018-02-10 DIAGNOSIS — R531 Weakness: Secondary | ICD-10-CM | POA: Insufficient documentation

## 2018-02-10 DIAGNOSIS — Z833 Family history of diabetes mellitus: Secondary | ICD-10-CM | POA: Diagnosis not present

## 2018-02-10 DIAGNOSIS — Z981 Arthrodesis status: Secondary | ICD-10-CM | POA: Diagnosis not present

## 2018-02-10 DIAGNOSIS — M791 Myalgia, unspecified site: Secondary | ICD-10-CM

## 2018-02-10 DIAGNOSIS — Z9641 Presence of insulin pump (external) (internal): Secondary | ICD-10-CM | POA: Insufficient documentation

## 2018-02-10 DIAGNOSIS — Z820 Family history of epilepsy and other diseases of the nervous system: Secondary | ICD-10-CM | POA: Insufficient documentation

## 2018-02-10 DIAGNOSIS — M792 Neuralgia and neuritis, unspecified: Secondary | ICD-10-CM | POA: Diagnosis not present

## 2018-02-10 DIAGNOSIS — E785 Hyperlipidemia, unspecified: Secondary | ICD-10-CM | POA: Insufficient documentation

## 2018-02-10 DIAGNOSIS — Z8249 Family history of ischemic heart disease and other diseases of the circulatory system: Secondary | ICD-10-CM | POA: Diagnosis not present

## 2018-02-10 MED ORDER — CELECOXIB 100 MG PO CAPS: 100.0000 mg | ORAL_CAPSULE | Freq: Two times a day (BID) | ORAL | 1 refills | Status: DC

## 2018-02-10 NOTE — Progress Notes (Signed)
Subjective:    Patient ID: Kenneth Pace, male    DOB: 01-08-1951, 67 y.o.   MRN: [id number]  HPI 67 y.o. 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   Last clinic visit 12/30/17.  Since that time, he states he has not started going to the gym or pool therapy yet. Continues to follow up with Neurology.   He is using the cane less. He has not tried TENS again. He is taking Diclofenac BID.  He tolerated increase in Elavil.    Pain Inventory Average Pain 3 Pain Right Now 3 My pain is constant and dull  In the last 24 hours, has pain interfered with the following? General activity 2 Relation with others 1 Enjoyment of life 1 What TIME of day is your pain at its worst? evening, evening Sleep?   Good Pain is worse with: bending Pain improves with: rest, heat/ice and medication Relief from Meds: 6  Mobility walk with assistance use a cane how many minutes can you walk? 30+ ability to climb steps?  yes do you drive?  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  no  Physicians involved in your care Any changes since last visit?  no   Family History  Problem Relation Age of Onset  . Dementia Mother   . Diabetes Mellitus I Mother   . Hypertension Father        52  . Healthy Sister   . Rheum arthritis Brother   . Cancer Neg Hx   . Colon cancer Neg Hx   . Esophageal cancer Neg Hx   . Stomach cancer Neg Hx   . Rectal cancer Neg Hx   . Colon polyps Neg Hx    Social History   Socioeconomic History  . Marital status: Married    Spouse name: Not on file  . Number of children: Not on file  . Years of education: Not on file  . Highest education level: Not on file  Occupational History  . Occupation: Psychologist, occupational: [employer]    Comment: Retail buyer  Social Needs  . Financial resource strain: Not on file  . Food insecurity:    Worry: Not on file    Inability: Not on file  . Transportation needs:    Medical: Not on file    Non-medical: Not on file  Tobacco Use  . Smoking status: Never Smoker  . Smokeless tobacco: Never Used  Substance and Sexual Activity  . Alcohol use: No  . Drug use: No  . Sexual activity: Not on file  Lifestyle  . Physical activity:    Days per week: Not on file    Minutes per session: Not on file  . Stress: Not on file  Relationships  . Social connections:    Talks on phone: Not on file    Gets together: Not on file    Attends religious service: Not on file    Active member of club or organization: Not on file    Attends meetings of clubs or organizations: Not on file    Relationship status: Not on file  Other Topics Concern  . Not on file  Social History Narrative   He works for [employer]  He lives at home with wife.     Highest level of education:  BS, business admin   Past Surgical History:  Procedure Laterality Date  . ANTERIOR CERVICAL DECOMP/DISCECTOMY FUSION N/A 02/01/2017   Procedure: ANTERIOR CERVICAL DECOMPRESSION/DISCECTOMY FUSION CERVICAL THREE-FOUR , CERVICAL FOUR-FIVE  CERVICAL FIVE-SIX;  Surgeon: Consuella Lose, MD;  Location: [facility];  Service: Neurosurgery;  Laterality: N/A;  . COLONOSCOPY  02/05/2010   [facility]  . KNEE SURGERY     2 Lknee arthroscopy, 3 R knee arthroscpoy  . KNEE SURGERY Right 11/12/2016  . TONSILLECTOMY     Past Medical History:  Diagnosis Date  . ADENOIDECTOMY, HX OF 02/16/2007  . Anxiety    pt. states he does not have anxiety.  Marland Kitchen BENIGN PROSTATIC HYPERTROPHY, WITH OBSTRUCTION 02/03/2010  . DIABETES MELLITUS, TYPE I, UNCONTROLLED 12/29/2006  . ERECTILE DYSFUNCTION 02/16/2007  . HYPERLIPIDEMIA 02/16/2007  . HYPERTENSION 02/16/2007  . Sleep apnea    uses CPAP  .  TESTICULAR MASS, LEFT 02/16/2007   BP (!) 161/92   Pulse 84   Ht 6' (1.829 m)   Wt 243 lb (110.2 kg)   SpO2 96%   BMI 32.96 kg/m   Opioid Risk Score:   Fall Risk Score:  `1  Depression screen PHQ 2/9  Depression screen [facility] 2/9 12/10/2017 07/07/2017 02/26/2017 02/26/2017  Decreased Interest 0 0 0 0  Down, Depressed, Hopeless 0 0 0 0  PHQ - 2 Score 0 0 0 0  Altered sleeping - - 1 -  Tired, decreased energy - - 0 -  Change in appetite - - 0 -  Feeling bad or failure about yourself  - - 0 -  Trouble concentrating - - 0 -  Moving slowly or fidgety/restless - - 1 -  Suicidal thoughts - - 0 -  PHQ-9 Score - - 2 -  Difficult doing work/chores - - Not difficult at all -   Review of Systems  Constitutional: Negative.   HENT: Negative.   Eyes: Negative.   Respiratory: Negative.   Cardiovascular: Positive for leg swelling.  Gastrointestinal: Negative.   Endocrine: Negative.   Genitourinary: Negative.   Musculoskeletal: Negative.   Skin: Negative.   Allergic/Immunologic: Negative.   Neurological: Positive for weakness and numbness.       Tingling   Hematological: Negative.   Psychiatric/Behavioral: Negative.   All other systems reviewed and are negative.     Objective:   Physical Exam Constitutional: He appears well-developed and well-nourished. No distress.  HENT: Normocephalic and atraumatic.  Eyes: EOM are normal. No discharge.  Neck: Right neck with honey comb dressing and edema, improving Cardiovascular: RRR. No JVD.  Respiratory: Effort normal and breath sounds normal.  GI: He exhibits no distention. BS+  Musculoskeletal: He exhibits no edema or tenderness in extremities.  +TTP b/l lumbosacral PSP Neurological: He is alert and oriented.  Motor: B/L UE 5/5 proximal to distal  B/l LE: HF 5/5, KE, ADF/PF 5/5 Sensation to light touch improving b/l feet Skin: Skin is warm and dry. No rash noted. He is not diaphoretic. No erythema.  Psychiatric: He has a normal mood  and affect. His behavior is normal. Judgment and thought content normal.     Assessment & Plan:  67 y.o. 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   1.  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  Cont HEP  Cont follow up Neurology -IVIG per Neurology  Cont work   2.  Pain Management:   Gabapentin and Lyrica d/ced due to edema  No benefit with Mobic, Naproxen  Cont Lidoderm patch  Cont TENS, reminded again  Cont Elavil to 75 qhs   Cont Cymbalta to 60mg  with food  Cont Baclofen per Neurology  Cont Tylenol  Will change Diclofenac 50 TID PRN to Celebrex 100 BID  Recommend pool therapy, again  3. Gait abnormality  Cont HEP  Cont cane  4. Mechanical low back - multifactorial  Myalgia +/- facet arthropathy  MRI L-spine reviewed, multilevel predominantly foraminal and facet arthropathy  Cont Balcofen per Neurology  Cont Cymbalta   Will consider facet injections in future if necessary   5. Myalgia  Good benefit with TP injections  See #1

## 2018-02-10 NOTE — Telephone Encounter (Signed)
Thank you :)

## 2018-02-10 NOTE — Progress Notes (Deleted)
Subjective:    Patient ID: Kenneth Pace, male    DOB: September 04, 1950, 67 y.o.   MRN: [id number]  HPI   Pain Inventory Average Pain 2 Pain Right Now 2 My pain is constant and dull  In the last 24 hours, has pain interfered with the following? General activity 2 Relation with others 1 Enjoyment of life 1 What TIME of day is your pain at its worst? evening Sleep (in general) Good  Pain is worse with: bending Pain improves with: rest, heat/ice and medication Relief from Meds: 7  Mobility walk with assistance use a cane how many minutes can you walk? 30+ ability to climb steps?  yes do you drive?  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  no  Physicians involved in your care Any changes since last visit?  no   Family History  Problem Relation Age of Onset  . Dementia Mother   . Diabetes Mellitus I Mother   . Hypertension Father        44  . Healthy Sister   . Rheum arthritis Brother   . Cancer Neg Hx   . Colon cancer Neg Hx   . Esophageal cancer Neg Hx   . Stomach cancer Neg Hx   . Rectal cancer Neg Hx   . Colon polyps Neg Hx    Social History   Socioeconomic History  . Marital status: Married    Spouse name: Not on file  . Number of children: Not on file  . Years of education: Not on file  . Highest education level: Not on file  Occupational History  . 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 Financial resource strain: Not on file  . Food insecurity:    Worry: Not on file    Inability: Not on file  . Transportation needs:    Medical: Not on file    Non-medical: Not on file  Tobacco Use  . Smoking status: Never Smoker  . Smokeless tobacco: Never Used  Substance and Sexual Activity  . Alcohol use: No  . Drug use: No  . Sexual activity: Not on file  Lifestyle  . Physical  activity:    Days per week: Not on file    Minutes per session: Not on file  . Stress: Not on file  Relationships  . Social connections:    Talks on phone: Not on file    Gets together: Not on file    Attends religious service: Not on file    Active member of club or organization: Not on file    Attends meetings of clubs or organizations: Not on file    Relationship status: Not on file  Other Topics Concern  . Not on file  Social History Narrative   He works for [employer]   He lives at home with wife.     Highest level of education:  BS, business admin   Past Surgical History:  Procedure Laterality Date  . ANTERIOR CERVICAL DECOMP/DISCECTOMY FUSION N/A 02/01/2017   Procedure: ANTERIOR CERVICAL DECOMPRESSION/DISCECTOMY FUSION CERVICAL THREE-FOUR , CERVICAL FOUR-FIVE  CERVICAL FIVE-SIX;  Surgeon: Consuella Lose, MD;  Location: [place];  Service: Neurosurgery;  Laterality: N/A;  . COLONOSCOPY  02/05/2010   [facility]  . KNEE SURGERY     2  Lknee arthroscopy, 3 R knee arthroscpoy  . KNEE SURGERY Right 11/12/2016  . TONSILLECTOMY     Past Medical History:  Diagnosis Date  . ADENOIDECTOMY, HX OF 02/16/2007  . Anxiety    pt. states he does not have anxiety.  Marland Kitchen BENIGN PROSTATIC HYPERTROPHY, WITH OBSTRUCTION 02/03/2010  . DIABETES MELLITUS, TYPE I, UNCONTROLLED 12/29/2006  . ERECTILE DYSFUNCTION 02/16/2007  . HYPERLIPIDEMIA 02/16/2007  . HYPERTENSION 02/16/2007  . Sleep apnea    uses CPAP  . TESTICULAR MASS, LEFT 02/16/2007   BP (!) 161/92   Pulse 84   Ht 6' (1.829 m)   Wt 243 lb (110.2 kg)   SpO2 96%   BMI 32.96 kg/m   Opioid Risk Score:   Fall Risk Score:  `1  Depression screen PHQ 2/9  Depression screen [facility] 2/9 12/10/2017 07/07/2017 02/26/2017 02/26/2017  Decreased Interest 0 0 0 0  Down, Depressed, Hopeless 0 0 0 0  PHQ - 2 Score 0 0 0 0  Altered sleeping - - 1 -  Tired, decreased energy - - 0 -  Change in appetite - - 0 -  Feeling bad or  failure about yourself  - - 0 -  Trouble concentrating - - 0 -  Moving slowly or fidgety/restless - - 1 -  Suicidal thoughts - - 0 -  PHQ-9 Score - - 2 -  Difficult doing work/chores - - Not difficult at all -    Review of Systems  Constitutional: Negative.   HENT: Negative.   Eyes: Negative.   Respiratory: Negative.   Cardiovascular: Positive for leg swelling.  Gastrointestinal: Negative.   Endocrine: Negative.   Genitourinary: Negative.   Musculoskeletal: Negative.   Skin: Negative.   Allergic/Immunologic: Negative.   Neurological: Positive for weakness and numbness.  Hematological: Negative.   Psychiatric/Behavioral: Negative.   All other systems reviewed and are negative.      Objective:   Physical Exam        Assessment & Plan:

## 2018-02-10 NOTE — Telephone Encounter (Signed)
Patient noticed on his AVS he is listed at having type 2 diabetes and he said it should be type 1.

## 2018-02-11 DIAGNOSIS — B351 Tinea unguium: Secondary | ICD-10-CM | POA: Diagnosis not present

## 2018-02-17 ENCOUNTER — Encounter (HOSPITAL_COMMUNITY): Payer: BLUE CROSS/BLUE SHIELD

## 2018-02-17 ENCOUNTER — Other Ambulatory Visit: Payer: Self-pay | Admitting: Physical Medicine & Rehabilitation

## 2018-02-17 ENCOUNTER — Telehealth: Payer: Self-pay | Admitting: Endocrinology

## 2018-02-17 NOTE — Telephone Encounter (Signed)
Patient is stating that [facility] has been trying to fax [place] for medical necessities and has closed the case due to no response. Patient provided email to send the information to personally. Please Advise, thanks  [name]

## 2018-02-17 NOTE — Telephone Encounter (Signed)
Are either of you aware of this request??

## 2018-02-18 NOTE — Telephone Encounter (Signed)
I am unaware 

## 2018-03-02 ENCOUNTER — Telehealth: Payer: Self-pay | Admitting: Endocrinology

## 2018-03-02 NOTE — Telephone Encounter (Signed)
Patient stated we have been trying to work on getting a CMN sent in for a pump. He stated we have been trying to get this faxed over and it is not working. He would like to know if he can come by and pick this up so he can try to get this sent through his email.   Please advise

## 2018-03-02 NOTE — Telephone Encounter (Signed)
Hi Mickel Baas,  I also wanted to follow up with you as well to determine if you might know about this patient's request. Please let me know if you may have initiated a request for a pump or if you may have seen the documents he speaks of.  Thanks, Lania Zawistowski

## 2018-03-02 NOTE — Telephone Encounter (Signed)
Spoke to pt and explained that we have not received any paperwork from [facility] for him. Asked him to have them call us directly and speak to nurse that is working for Dr. Loanne Drilling so that we can find out exactly what needs to be faxed.

## 2018-03-02 NOTE — Telephone Encounter (Signed)
Any ideas? I do not have a CMN for this pt in our faxed file.

## 2018-03-03 NOTE — Telephone Encounter (Signed)
Kenneth Pace saw in 2 years ago and trained him on a Medtronic 630.  I have not seen him.  Is he wanting to upgrade to a new pump?

## 2018-03-04 NOTE — Telephone Encounter (Signed)
When you spoke with him, did he mention that he was pursuing this on his own? Nobody in this office has spoken to him re: a pump.

## 2018-03-04 NOTE — Telephone Encounter (Signed)
Pt only mentioned paperwork did not specify what it was for

## 2018-03-07 ENCOUNTER — Telehealth: Payer: Self-pay

## 2018-03-07 NOTE — Telephone Encounter (Signed)
[name] [place] Key: [id number] Need help? Call us at [phone]  Outcome: Drug is covered by current benefit plan. No further PA activity needed.  DrugContour Next Test strips  Form [employer] Electronic PA Form

## 2018-03-16 ENCOUNTER — Other Ambulatory Visit: Payer: Self-pay | Admitting: *Deleted

## 2018-03-16 DIAGNOSIS — G6181 Chronic inflammatory demyelinating polyneuritis: Secondary | ICD-10-CM

## 2018-03-17 ENCOUNTER — Ambulatory Visit (HOSPITAL_COMMUNITY)
Admission: RE | Admit: 2018-03-17 | Discharge: 2018-03-17 | Disposition: A | Discharge status: Home / Self Care | Payer: BLUE CROSS/BLUE SHIELD | Source: Ambulatory Visit | Attending: Neurology | Admitting: Neurology

## 2018-03-17 ENCOUNTER — Other Ambulatory Visit: Payer: Self-pay | Admitting: Endocrinology

## 2018-03-17 DIAGNOSIS — G6181 Chronic inflammatory demyelinating polyneuritis: Secondary | ICD-10-CM | POA: Diagnosis not present

## 2018-03-17 MED ORDER — IMMUNE GLOBULIN (HUMAN) 20 GM/200ML IV SOLN
1.0000 g/kg | INTRAVENOUS | Status: DC
  Administered 2018-03-17: 110 g via INTRAVENOUS
  Filled 2018-03-17: qty 100

## 2018-03-17 NOTE — Telephone Encounter (Signed)
Called [employer] to request that they fax an order request and LMN/CMN for Enlite sensors for completion and refill. Per [place], they are not able to process request. States pt will need to contact [employer] @ [phone] to complete their request. Per [place], once the pt has made the request, an order and LMN/CMN will be faxed to our office for completion along with a request for medical records.  Called pt to make aware of above conversation. Also needing clarification of type of Lancets used (discrepency in medication list pertaining to type of device used/ Contour vs Accu Chek). Pharmacy information not supplied as well. Will need to know which pharmacy to send lancet refill. LVM requesting returned call.

## 2018-03-17 NOTE — Progress Notes (Signed)
Follow-up Visit   Date: 03/18/18   Kenneth Pace MRN: [id number] DOB: 1950/09/25   Interim History: Kenneth Pace is a 68 y.o. right-handed Caucasian male with insulin-dependent diabetes mellitus, hypertension, OSA on CPAP, hyperlipidemia, anxiety, and cervical myelopathy s/p decompression at C3-C6 returning to the clinic for follow-up of CIDP  The patient was accompanied to the clinic by wife who also provides collateral information.    History of present illness: Around late August, he started having electrical impulses of the arms, which started in the neck and radiates into the palm. He suffered a fall prior to this so he felt the he may have injured his neck.  A few weeks later, he also noticed weakness of the hands and unable to type at work.  In mid-September, he began having gait imbalance and started falling frequently. Since then, he has numbness/tingling of the feet and intermittent sharp pain radiating from his back into his mid-chest.  He has weakness of the both legs and has difficulty standing, climbing steps, and walking.  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   His testing showed albuminocytologic disocciation with CSF protein 81 and normal cell count.   He completed 5-days IVIG with the last dose on 10/9 and felt some improvement in the hands, but legs were weaker.  In the fall 2019, he developed progressive leg weakness and was found to have myelopathic findings.  Imaging of the cervical spine showed moderate multilevel cervical stenosis.  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 Kathyrn Sheriff on 02/01/2017 which has significantly improved his gait  and lower extremity strength.  He has been seeing Dr. Delice Lesch, PM&R, for rehab and pain management and getting out-patient physical therapy.   He continues to have numbness and tingling of the feet, as well as significant leg stiffness and imbalance.   Baclofen has improved cramps and gait.     UPDATE 03/17/2018:  He is here for follow-up visit.  He is doing fairly well.  He is no longer walking with a cane.  His last IVIG was at an interval of 2 months and by week 7, he began having increased numbness/tingling of the hands.  He denies any new weakness.  His gait continues to be stiff, which is worse after prolonged sitting.  He is working full-time at [employer] job and has increased stiffness by the end of the day.  He takes baclofen '10mg'$  in the morning and afternoon, and '20mg'$  at bedtime and is tolerating it well, without sedation. Pain is controlled on amitriptyline '75mg'$  at bedtime, prescribed by Dr. Delice Lesch.   Medications:  Current Outpatient Medications on File Prior to Visit  Medication Sig Dispense Refill  . acetaminophen (TYLENOL) 325 MG tablet Take 2 tablets (650 mg total) by mouth every 6 (six) hours as needed for mild pain or headache.    Marland Kitchen amitriptyline (ELAVIL) 25 MG tablet     . amitriptyline (ELAVIL) 75 MG tablet Take 1 tablet (75 mg total) by mouth at bedtime. 30 tablet 1  . B Complex Vitamins (B-COMPLEX/B-12 PO) Take by mouth.    . busPIRone (BUSPAR) 30 MG tablet TAKE ONE-HALF (1/2) TABLET (15 MG) TWICE A DAY 180 tablet 1  . celecoxib (CELEBREX)  100 MG capsule Take 1 capsule (100 mg total) by mouth 2 (two) times daily. 60 capsule 1  . Coenzyme Q10 (COQ10) 200 MG CAPS Take by mouth.    . Continuous Glucose Monitor Sup (ENLITE GLUCOSE SENSOR) MISC 1 Device by Does not apply route every 3 (three) days. 30 each 3  . Cranberry 400 MG TABS Take 2 each by mouth daily.      . Cyanocobalamin (VITAMIN B-12 CR PO) Take 1 each by mouth daily.      . finasteride (PROSCAR) 5 MG tablet Take 5  mg by mouth daily.     Marland Kitchen glucagon 1 MG injection Inject 1 mg into the vein once as needed. 1 each 12  . glucose blood (BAYER CONTOUR NEXT TEST) test strip MEDICALLY NECESSARY FOR USE WITH PUMP; Use to check blood sugar 5 times per day and prn; E11.42 500 each 3  . HUMALOG 100 UNIT/ML injection INJECT 120 UNITS DAILY IN PUMP 100 mL 4  . Insulin Infusion Pump Supplies (PARADIGM RESERVOIR 3ML) MISC 1 Device by Does not apply route every 3 (three) days. 30 each 3  . Insulin Infusion Pump Supplies (QUICK-SET INFUSION 43" 9MM) MISC 1 Device by Does not apply route every 3 (three) days. 30 each 3  . Lancets Misc. (ACCU-CHEK MULTICLIX LANCET DEV) KIT 1 Device by Other route 5 (five) times daily as needed. 5/day dx 250.01 450 each 3  . lisinopril-hydrochlorothiazide (PRINZIDE,ZESTORETIC) 10-12.5 MG tablet TAKE 1 TABLET DAILY 90 tablet 1  . Multiple Vitamin (MULTIVITAMIN) capsule Take by mouth.    . Multiple Vitamins-Minerals (MULTIVITAMIN,TX-MINERALS) tablet Take 1 tablet by mouth daily.      Marland Kitchen scopolamine (TRANSDERM-SCOP) 1 MG/3DAYS Place 1 patch (1.5 mg total) onto the skin every 3 (three) days. 10 patch 12  . DULoxetine (CYMBALTA) 60 MG capsule Take 1 capsule (60 mg total) by mouth daily. 90 capsule 0   No current facility-administered medications on file prior to visit.     Allergies: No Known Allergies  Review of Systems:  CONSTITUTIONAL: No fevers, chills, night sweats, or weight loss.  EYES: No visual changes or eye pain ENT: No hearing changes.  No history of nose bleeds.   RESPIRATORY: No cough, wheezing and shortness of breath.   CARDIOVASCULAR: Negative for chest pain, and palpitations.   GI: Negative for abdominal discomfort, blood in stools or black stools.  No recent change in bowel habits.   GU:  No history of incontinence.   MUSCLOSKELETAL: No history of joint pain or swelling.  No myalgias.   SKIN: Negative for lesions, rash, and itching.   ENDOCRINE: Negative for cold or heat  intolerance, polydipsia or goiter.   PSYCH:  No depression or anxiety symptoms.   NEURO: As Above.   Vital Signs:  BP 124/80   Pulse 79   Ht 6' (1.829 m)   Wt 243 lb 8 oz (110.5 kg)   SpO2 93%   BMI 33.02 kg/m   General Medical Exam:   General:  Well appearing, comfortable  Eyes/ENT: see cranial nerve examination.   Neck:  No carotid bruits. Respiratory:  Clear to auscultation, good air entry bilaterally.   Cardiac:  Regular rate and rhythm, no murmur.   Ext:  No edema   Neurological Exam: MENTAL STATUS including orientation to time, place, person, recent and remote memory, attention span and concentration, language, and fund of knowledge is normal.  Speech is not dysarthric.  CRANIAL NERVES:  Pupils equal round and reactive to  light.  Normal conjugate, extra-ocular eye movements in all directions of gaze.  No ptosis. Face is symmetric. Palate elevates symmetrically.  Tongue is midline.  MOTOR:  There is moderate right FDI and ADM atrophy.  No fasciculations or abnormal movements.  No pronator drift.    Right Upper Extremity:    Left Upper Extremity:    Deltoid  5/5   Deltoid  5/5   Biceps  5/5   Biceps  5/5   Triceps  5/5   Triceps  5/5   Wrist extensors  5/5   Wrist extensors  5/5   Wrist flexors  5/5   Wrist flexors  5/5   Finger extensors  5/5   Finger extensors  5/5   Finger flexors  5/5   Finger flexors  5/5   Dorsal interossei  5-/5   Dorsal interossei  5/5   Abductor pollicis  5/5   Abductor pollicis  5/5   Tone (Ashworth scale)  0  Tone (Ashworth scale)  0   Right Lower Extremity:    Left Lower Extremity:    Hip flexors  5/5   Hip flexors  5/5   Hip extensors  5/5   Hip extensors  5/5   Knee flexors  5/5   Knee flexors  5/5   Knee extensors  5/5   Knee extensors  5/5   Dorsiflexors  5/5   Dorsiflexors  5/5   Plantarflexors  5/5   Plantarflexors  5/5   Toe extensors  5/5   Toe extensors  5/5   Toe flexors  5/5   Toe flexors  5/5   Tone (Ashworth scale)  0+   Tone (Ashworth scale)  0+    MSRs:  Right                                                                 Left brachioradialis 2+  brachioradialis 2+  biceps 2+  biceps 2+  triceps 2+  triceps 2+  patellar 3+  patellar 3+  ankle jerk 2+  ankle jerk 2+  Hoffman no  Hoffman no  plantar response down  plantar response down   SENSORY:  Vibration 100% at the MCP, 100% at the elbow and knees, and 60% distal to ankles.    COORDINATION/GAIT: Gait is spastic, unassisted and appears stable, improved from previous visit.   Data: MRI cervical and lumbar spine 11/20/2016: 1. No focal cord signal abnormality or lesion. 2. Multilevel spondylosis of the cervical spine as described. 3. Moderate central and mild bilateral foraminal stenosis at C3-4. 4. Moderate central and bilateral foraminal narrowing at C4-5. 5. Moderate central and severe bilateral foraminal stenosis at C5-6. 6. Moderate foraminal narrowing bilaterally at C6-7 without significant central canal stenosis. 7. Short pedicles in the cervical and lumbar spine contribute to the stenosis. 8. Disc bulging at L2-3 and L3-4 without significant stenosis. 9. Disc bulging and facet hypertrophy at L4-5 with mild subarticular and foraminal stenosis bilaterally. 10. Mild left foraminal narrowing at L5-S1 secondary to asymmetric facet hypertrophy.  CSF 11/20/2016:  R1 W3 P81* G102*  No OCB, VDRL neg Labs 11/20/2016:  SPEP with IFE no M protein, 486, TSH 1.051  Lab Results  Component Value Date   HGBA1C 7.1 (A) 01/20/2018  MRI cervical spine 12/31/2016:   1. No enlargement of the cranial nerves or cervical nerve roots. 2. No acute intracranial abnormality. 3. Multilevel moderate cervical spinal canal stenosis, worst at C3-4 and C5-6 with associated mild cord deformity but no cord signal change. 4. Moderate to severe neural foraminal stenosis at C3-4, C4-5 and C5-6.  NCS/EMG of the right upper and lower extremities 04/27/2017: 1. The  electrophysiologic findings are most consistent with an active on chronic polyradiculoneuropathy affecting right upper extremity.  When compared to his previous study on 11/10/2016, there is mild interval improvement.  2. Chronic C6 radiculopathy affecting the right upper extremity, mild in degree electrically.  3. There is no evidence of a sensorimotor polyneuropathy or lumbosacral radiculopathy affecting the right lower extremity.    IMPRESSION/PLAN: 1.  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  He was initially treated with IVIG and plasmapheresis and has been on a tapering schedule of IVIG.  Unfortunately, around week 7 he began having worsening paresthesias, so I will adjust his IVIG to 3m/kg every 6 weeks.   2.  Myelopathy secondary to cervical canal stenosis s/p decompression from C3-6.  Increase baclofen to 288mTID.  Side effects discussed.  Fall precautions were discussed, use cane as needed.  He was strongly encouraged to be compliant with stretching exercises.   Return to clinic in 4 months  Thank you for allowing me to participate in patient's care.  If I can answer any additional questions, I would be pleased to do so.    Sincerely,    Denese Mentink K. PaPosey ProntoDO

## 2018-03-17 NOTE — Telephone Encounter (Signed)
MEDICATION: Insulin Infusion Pump supplies, Lancets, all DME  PHARMACY:    IS THIS A 90 DAY SUPPLY :   IS PATIENT OUT OF MEDICATION:   IF NOT; HOW MUCH IS LEFT:   LAST APPOINTMENT DATE: @1 /20/2020  NEXT APPOINTMENT DATE:@3 /13/2020  DO WE HAVE YOUR PERMISSION TO LEAVE A DETAILED MESSAGE:  OTHER COMMENTS:    **Let patient know to contact pharmacy at the end of the day to make sure medication is ready. **  ** Please notify patient to allow 48-72 hours to process**  **Encourage patient to contact the pharmacy for refills or they can request refills through [facility]**

## 2018-03-18 ENCOUNTER — Ambulatory Visit: Payer: BLUE CROSS/BLUE SHIELD | Admitting: Neurology

## 2018-03-18 ENCOUNTER — Encounter: Payer: Self-pay | Admitting: Neurology

## 2018-03-18 VITALS — BP 124/80 | HR 79 | Ht 72.0 in | Wt 243.5 lb

## 2018-03-18 DIAGNOSIS — G959 Disease of spinal cord, unspecified: Secondary | ICD-10-CM | POA: Diagnosis not present

## 2018-03-18 DIAGNOSIS — G6181 Chronic inflammatory demyelinating polyneuritis: Secondary | ICD-10-CM

## 2018-03-18 MED ORDER — BACLOFEN 10 MG PO TABS: 20.0000 mg | ORAL_TABLET | Freq: Three times a day (TID) | ORAL | 3 refills | Status: DC

## 2018-03-18 NOTE — Telephone Encounter (Signed)
Patient came into the office today and spoke with me regarding the voicemail that was left with her yesterday by A.Eversole,LPN. Patient was still confused as to what needed to be done in order to get the supplies for her husband. I pulled up the previous notes and went over all of them with the patient and his wife. I gave them the number to [employer] and the person that the previous nurse spoke with the day prior, as well as the information the patient needs to request to be sent to this office. Fax number was provided to patient as well. Patient and spouse verbalized understanding of the situation at this point and also that it was not to the fault of this office, but that of [employer]. Pt's spouse also stated that she believed that [employer] has been faxing the paperwork to be filled out to Medical Records rather than this office directly because someone at [employer] told her this and she has received letters from her insurance company stating the names of people they spoke with and none of these people are individuals who work in this office now or in the past.  -Patient also stated that he was denied coverage for contour next test strips, and theses are the only strips that are compatible with the insulin pump he has. Upon review of chart notes, it was determined that in the past, A.Eversole,LPN has spoken with the patient's insurance and they stated that the test strips are covered under his insurance plan. Pt and his wife were then given the phone number to [facility], their pharmacy provider, as well as the key to the PA request that was submitted for Contour Next Test Strips, in which it was stated that the test strips were covered under the current benefits plan. Patient and spouse verbalized understanding of all of this and stated that they will be calling [employer] to request the CMN/LMN to the fax number that this nurse provided to them and then call their pharmacy provider and inquire as to why  they are being denied the test strips.  Patient was also asked as a confirmation if there were any other supplies that he was in need of at this time, and patient stated that he was good on all other supplies including lancets and they did not need a new Rx at this time.

## 2018-03-18 NOTE — Patient Instructions (Signed)
We will schedule your IVIG to every 6 weeks  Increase baclofen to 20mg  three times daily.  Start with increasing the morning dose for one week.  Return to clinic in 4 months

## 2018-03-22 ENCOUNTER — Other Ambulatory Visit: Payer: Self-pay | Admitting: *Deleted

## 2018-03-22 DIAGNOSIS — G6181 Chronic inflammatory demyelinating polyneuritis: Secondary | ICD-10-CM

## 2018-03-24 ENCOUNTER — Telehealth: Payer: Self-pay | Admitting: *Deleted

## 2018-03-24 NOTE — Telephone Encounter (Signed)
IVIG scheduled for q 6 weeks starting on 04/25/2018 at 9:00.  Patient's wife notified but she will call and change date due to their schedule.

## 2018-03-27 ENCOUNTER — Other Ambulatory Visit: Payer: Self-pay | Admitting: Physical Medicine & Rehabilitation

## 2018-03-28 ENCOUNTER — Other Ambulatory Visit: Payer: Self-pay | Admitting: Endocrinology

## 2018-03-28 NOTE — Telephone Encounter (Signed)
Please refill x 3 months Further refills would have to be considered by new PCP   

## 2018-04-13 ENCOUNTER — Encounter: Payer: Self-pay | Admitting: Physical Medicine & Rehabilitation

## 2018-04-13 ENCOUNTER — Encounter
Payer: BLUE CROSS/BLUE SHIELD | Attending: Physical Medicine & Rehabilitation | Admitting: Physical Medicine & Rehabilitation

## 2018-04-13 VITALS — BP 154/81 | HR 83 | Ht 72.0 in | Wt 245.0 lb

## 2018-04-13 DIAGNOSIS — G4733 Obstructive sleep apnea (adult) (pediatric): Secondary | ICD-10-CM | POA: Diagnosis not present

## 2018-04-13 DIAGNOSIS — N400 Enlarged prostate without lower urinary tract symptoms: Secondary | ICD-10-CM | POA: Insufficient documentation

## 2018-04-13 DIAGNOSIS — Z820 Family history of epilepsy and other diseases of the nervous system: Secondary | ICD-10-CM | POA: Insufficient documentation

## 2018-04-13 DIAGNOSIS — G6181 Chronic inflammatory demyelinating polyneuritis: Secondary | ICD-10-CM

## 2018-04-13 DIAGNOSIS — R531 Weakness: Secondary | ICD-10-CM | POA: Diagnosis not present

## 2018-04-13 DIAGNOSIS — G610 Guillain-Barre syndrome: Secondary | ICD-10-CM | POA: Diagnosis not present

## 2018-04-13 DIAGNOSIS — Z833 Family history of diabetes mellitus: Secondary | ICD-10-CM | POA: Insufficient documentation

## 2018-04-13 DIAGNOSIS — M545 Low back pain: Secondary | ICD-10-CM | POA: Diagnosis not present

## 2018-04-13 DIAGNOSIS — Z9889 Other specified postprocedural states: Secondary | ICD-10-CM | POA: Insufficient documentation

## 2018-04-13 DIAGNOSIS — E1065 Type 1 diabetes mellitus with hyperglycemia: Secondary | ICD-10-CM

## 2018-04-13 DIAGNOSIS — Z8261 Family history of arthritis: Secondary | ICD-10-CM | POA: Insufficient documentation

## 2018-04-13 DIAGNOSIS — E785 Hyperlipidemia, unspecified: Secondary | ICD-10-CM | POA: Insufficient documentation

## 2018-04-13 DIAGNOSIS — M791 Myalgia, unspecified site: Secondary | ICD-10-CM

## 2018-04-13 DIAGNOSIS — R269 Unspecified abnormalities of gait and mobility: Secondary | ICD-10-CM | POA: Insufficient documentation

## 2018-04-13 DIAGNOSIS — Z981 Arthrodesis status: Secondary | ICD-10-CM | POA: Diagnosis not present

## 2018-04-13 DIAGNOSIS — I10 Essential (primary) hypertension: Secondary | ICD-10-CM | POA: Insufficient documentation

## 2018-04-13 DIAGNOSIS — E109 Type 1 diabetes mellitus without complications: Secondary | ICD-10-CM | POA: Diagnosis not present

## 2018-04-13 DIAGNOSIS — Z8249 Family history of ischemic heart disease and other diseases of the circulatory system: Secondary | ICD-10-CM | POA: Diagnosis not present

## 2018-04-13 DIAGNOSIS — M4802 Spinal stenosis, cervical region: Secondary | ICD-10-CM | POA: Insufficient documentation

## 2018-04-13 DIAGNOSIS — M792 Neuralgia and neuritis, unspecified: Secondary | ICD-10-CM | POA: Diagnosis not present

## 2018-04-13 DIAGNOSIS — Z9641 Presence of insulin pump (external) (internal): Secondary | ICD-10-CM | POA: Insufficient documentation

## 2018-04-13 DIAGNOSIS — G8929 Other chronic pain: Secondary | ICD-10-CM

## 2018-04-13 DIAGNOSIS — E119 Type 2 diabetes mellitus without complications: Secondary | ICD-10-CM

## 2018-04-13 DIAGNOSIS — N529 Male erectile dysfunction, unspecified: Secondary | ICD-10-CM | POA: Diagnosis not present

## 2018-04-13 DIAGNOSIS — G959 Disease of spinal cord, unspecified: Secondary | ICD-10-CM | POA: Insufficient documentation

## 2018-04-13 DIAGNOSIS — N319 Neuromuscular dysfunction of bladder, unspecified: Secondary | ICD-10-CM | POA: Insufficient documentation

## 2018-04-13 DIAGNOSIS — M4712 Other spondylosis with myelopathy, cervical region: Secondary | ICD-10-CM

## 2018-04-13 MED ORDER — CELECOXIB 100 MG PO CAPS: 100.0000 mg | ORAL_CAPSULE | Freq: Every day | ORAL | 2 refills | Status: DC

## 2018-04-13 NOTE — Progress Notes (Addendum)
Subjective:    Patient ID: Kenneth Pace, male    DOB: 1950-06-04, 68 y.o.   MRN: [id number]  HPI Male with history of T2DM, OSA,,  subacute inflammatory polyradiculoneuropathy treated with IVIG for lower extremity weakness 11/2016 presents for follow up for cervical myelopathy and CIDP and back pain.   Last clinic visit 02/10/2018.  Since that time, he states he continues doing HEP.  He continues to take IVIG, now with decreased freq.  He is doing exercises with aquatic therapy.  He believes improvement in back pain with improved activity. He continues to use Lidoderm patch. He states he is too lazy to use TENS.   He continues to take Elavil and Cymbalta.  He is taking Celebrex qhs. He trying to wean use of cane.  Had 1 fall when his dog jumped on him.  Pain Inventory Average Pain 3 Pain Right Now 2 My pain is constant and dull  In the last 24 hours, has pain interfered with the following? General activity 2 Relation with others 1 Enjoyment of life 1 What TIME of day is your pain at its worst? evening Sleep?   Good Pain is worse with: bending Pain improves with: rest, heat/ice and medication Relief from Meds: 6  Mobility walk with assistance use a cane how many minutes can you walk? 30+ ability to climb steps?  yes do you drive?  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  no  Physicians involved in your care Any changes since last visit?  no Neurologist Dr. Vick Frees)   Family History  Problem Relation Age of Onset  . Dementia Mother   . Diabetes Mellitus I Mother   . Hypertension Father        29  . Healthy Sister   . Rheum arthritis Brother   . Cancer Neg Hx   . Colon cancer Neg Hx   . Esophageal cancer Neg Hx   . Stomach cancer Neg Hx   . Rectal cancer Neg Hx   . Colon polyps Neg Hx    Social History   Socioeconomic History  .  Marital status: Married    Spouse name: Not on file  . Number of children: Not on file  . Years of education: Not on file  . Highest education level: Not on file  Occupational History  . Occupation: Lobbyist: [employer]    Comment: Retail buyer  Social Needs  . Financial resource strain: Not on file  . Food insecurity:    Worry: Not on file    Inability: Not on file  . Transportation needs:    Medical: Not on file    Non-medical: Not on file  Tobacco Use  . Smoking status: Never Smoker  . Smokeless tobacco: Never Used  Substance and Sexual Activity  . Alcohol use: No  . Drug use: No  . Sexual activity: Not on file  Lifestyle  . Physical activity:    Days per week: Not on file    Minutes per session: Not on file  . Stress: Not on file  Relationships  . Social connections:    Talks on phone: Not on file    Gets together: Not on file    Attends religious service: Not on file    Active member of club or organization: Not on file  Attends meetings of clubs or organizations: Not on file    Relationship status: Not on file  Other Topics Concern  . Not on file  Social History Narrative   He works for [employer] [place]   He lives at home with wife.     Highest level of education:  BS, business admin   Past Surgical History:  Procedure Laterality Date  . ANTERIOR CERVICAL DECOMP/DISCECTOMY FUSION N/A 02/01/2017   Procedure: ANTERIOR CERVICAL DECOMPRESSION/DISCECTOMY FUSION CERVICAL THREE-FOUR , CERVICAL FOUR-FIVE  CERVICAL FIVE-SIX;  Surgeon: Consuella Lose, MD;  Location: [facility];  Service: Neurosurgery;  Laterality: N/A;  . COLONOSCOPY  02/05/2010   [facility]  . KNEE SURGERY     2 Lknee arthroscopy, 3 R knee arthroscpoy  . KNEE SURGERY Right 11/12/2016  . TONSILLECTOMY     Past Medical History:  Diagnosis Date  . ADENOIDECTOMY, HX OF 02/16/2007  . Anxiety    pt. states he does not have anxiety.  Marland Kitchen BENIGN PROSTATIC  HYPERTROPHY, WITH OBSTRUCTION 02/03/2010  . DIABETES MELLITUS, TYPE I, UNCONTROLLED 12/29/2006  . ERECTILE DYSFUNCTION 02/16/2007  . HYPERLIPIDEMIA 02/16/2007  . HYPERTENSION 02/16/2007  . Sleep apnea    uses CPAP  . TESTICULAR MASS, LEFT 02/16/2007   BP (!) 154/81   Pulse 83   Ht 6' (1.829 m)   Wt 245 lb (111.1 kg)   SpO2 95%   BMI 33.23 kg/m   Opioid Risk Score:   Fall Risk Score:  `1  Depression screen PHQ 2/9  Depression screen [facility] 2/9 12/10/2017 07/07/2017 02/26/2017 02/26/2017  Decreased Interest 0 0 0 0  Down, Depressed, Hopeless 0 0 0 0  PHQ - 2 Score 0 0 0 0  Altered sleeping - - 1 -  Tired, decreased energy - - 0 -  Change in appetite - - 0 -  Feeling bad or failure about yourself  - - 0 -  Trouble concentrating - - 0 -  Moving slowly or fidgety/restless - - 1 -  Suicidal thoughts - - 0 -  PHQ-9 Score - - 2 -  Difficult doing work/chores - - Not difficult at all -   Review of Systems  Constitutional: Negative.   HENT: Negative.   Eyes: Negative.   Respiratory: Negative.   Cardiovascular: Positive for leg swelling.  Gastrointestinal: Negative.   Endocrine: Negative.   Genitourinary: Negative.   Musculoskeletal: Negative.   Skin: Negative.   Allergic/Immunologic: Negative.   Neurological: Positive for weakness and numbness.       Tingling   Hematological: Negative.   Psychiatric/Behavioral: Negative.   All other systems reviewed and are negative.     Objective:   Physical Exam Constitutional: He appears well-developed and well-nourished. No distress.  Cardiovascular: RRR. No JVD.  Respiratory: Effort normal and breath sounds normal.  GI: He exhibits no distention. BS+  Musculoskeletal: He exhibits no edema or tenderness in extremities.  +TTP b/l lumbosacral PSP Neurological: He is alert and oriented.  Motor: B/L UE 5/5 proximal to distal  B/l LE: HF 5/5, KE, ADF/PF 5/5 Skin: Intact. Warm and dry.  Psychiatric: He has a normal mood and affect.  His behavior is normal. Judgment and thought content normal.     Assessment & Plan:  Male with history of T2DM, OSA,,  subacute inflammatory polyradiculoneuropathy treated with IVIG for lower extremity weakness 11/2016 presents for follow up for cervical myelopathy and CIDP and back pain.   1.  Weakness, limitations with ADLs secondary to demyelinating  myeloneuropathy and cervical stenosis with cord compression - s/p ACDF C3-C5.  Cont HEP  Cont follow up Neurology -IVIG per Neurology  Cont work   2.  Pain Management:   Gabapentin and Lyrica d/ced due to edema  No benefit with Mobic, Naproxen  Cont Lidoderm patch  Cont TENS, reminded again  Cont Elavil to 75 qhs   Cont Cymbalta to 60mg  with food  Cont Baclofen per Neurology  Cont Tylenol  Cont Celebrex 100 qhs - encouraged bring in lab work  May consider Robaxin due to lethargy with Baclofen at higher doses  3. Gait abnormality  Cont HEP  Cont cane  4. Mechanical low back - multifactorial  Myalgia +/- facet arthropathy  MRI L-spine reviewed, multilevel predominantly foraminal and facet arthropathy  Cont Balcofen per Neurology  Cont Cymbalta   Will consider facet injections in future if necessary   5. Myalgia  Good benefit with TP injections  Does not feel like he needs at present  See #1

## 2018-04-14 ENCOUNTER — Inpatient Hospital Stay (HOSPITAL_COMMUNITY): Admission: RE | Admit: 2018-04-14 | Payer: BLUE CROSS/BLUE SHIELD | Source: Ambulatory Visit

## 2018-04-25 ENCOUNTER — Encounter (HOSPITAL_COMMUNITY): Payer: BLUE CROSS/BLUE SHIELD

## 2018-04-29 ENCOUNTER — Ambulatory Visit: Payer: BLUE CROSS/BLUE SHIELD | Admitting: Endocrinology

## 2018-04-29 ENCOUNTER — Other Ambulatory Visit: Payer: Self-pay

## 2018-04-29 ENCOUNTER — Encounter (HOSPITAL_COMMUNITY)
Admission: RE | Admit: 2018-04-29 | Discharge: 2018-04-29 | Disposition: A | Discharge status: Home / Self Care | Payer: BLUE CROSS/BLUE SHIELD | Source: Ambulatory Visit | Attending: Neurology | Admitting: Neurology

## 2018-04-29 ENCOUNTER — Encounter: Payer: Self-pay | Admitting: Endocrinology

## 2018-04-29 VITALS — BP 120/74 | HR 73 | Ht 72.0 in | Wt 240.2 lb

## 2018-04-29 DIAGNOSIS — G6181 Chronic inflammatory demyelinating polyneuritis: Secondary | ICD-10-CM | POA: Diagnosis not present

## 2018-04-29 DIAGNOSIS — E119 Type 2 diabetes mellitus without complications: Secondary | ICD-10-CM

## 2018-04-29 LAB — POCT GLYCOSYLATED HEMOGLOBIN (HGB A1C): Hemoglobin A1C: 8.5 % — AB (ref 4.0–5.6)

## 2018-04-29 MED ORDER — IMMUNE GLOBULIN (HUMAN) 20 GM/200ML IV SOLN
1.0000 g/kg | INTRAVENOUS | Status: DC
  Administered 2018-04-29: 110 g via INTRAVENOUS
  Filled 2018-04-29: qty 100

## 2018-04-29 NOTE — Progress Notes (Signed)
Subjective:    Patient ID: Kenneth Pace, male    DOB: 06/16/1950, 68 y.o.   MRN: [id number]  HPI Pt returns for f/u of diabetes mellitus:  DM type: 1 Dx'ed: 5956 Complications: retinopathy and polyneuropathy.   Therapy: insulin since dx.  DKA: only at dx.   Severe hypoglycemia: most recently in 2014.  Pancreatitis: never Other: he has a medtronic pump and continuous glucose monitor; he is retired. continuous glucose monitor ID is [email or web address] and password is [email or web address].  Interval history:  Pt takes these pump settings: basal rate of 4.4 units/hr 6 AM-10 PM, and 2.6 units/hr 10 PM-6 AM mealtime bolus of 1 unit with each meal.   correction bolus (which some people call "sensitivity," or "insulin sensitivity ratio," or just "isr") of 1 unit for each 50 by which your glucose exceeds 100.  He suspends the pump for 1-2 hrs, with activity.  If not, he eats a light snack with it.   pt states he feels well in general.  No pump or continuous glucose monitor problems.  I reviewed continuous glucose monitor data.  Glucose varies from 75-390, but most are in the 100's.  It is in general lowest fasting, and higher PC. Past Medical History:  Diagnosis Date  . ADENOIDECTOMY, HX OF 02/16/2007  . Anxiety    pt. states he does not have anxiety.  Marland Kitchen BENIGN PROSTATIC HYPERTROPHY, WITH OBSTRUCTION 02/03/2010  . DIABETES MELLITUS, TYPE I, UNCONTROLLED 12/29/2006  . ERECTILE DYSFUNCTION 02/16/2007  . HYPERLIPIDEMIA 02/16/2007  . HYPERTENSION 02/16/2007  . Sleep apnea    uses CPAP  . TESTICULAR MASS, LEFT 02/16/2007    Past Surgical History:  Procedure Laterality Date  . ANTERIOR CERVICAL DECOMP/DISCECTOMY FUSION N/A 02/01/2017   Procedure: ANTERIOR CERVICAL DECOMPRESSION/DISCECTOMY FUSION CERVICAL THREE-FOUR , CERVICAL FOUR-FIVE  CERVICAL FIVE-SIX;  Surgeon: Consuella Lose, MD;  Location: [facility];  Service: Neurosurgery;  Laterality: N/A;  . COLONOSCOPY  02/05/2010   [facility]  . KNEE  SURGERY     2 Lknee arthroscopy, 3 R knee arthroscpoy  . KNEE SURGERY Right 11/12/2016  . TONSILLECTOMY      Social History   Socioeconomic History  . Marital status: Married    Spouse name: Not on file  . Number of children: Not on file  . Years of education: Not on file  . Highest education level: Not on file  Occupational History  . 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 Financial resource strain: Not on file  . Food insecurity:    Worry: Not on file    Inability: Not on file  . Transportation needs:    Medical: Not on file    Non-medical: Not on file  Tobacco Use  . Smoking status: Never Smoker  . Smokeless tobacco: Never Used  Substance and Sexual Activity  . Alcohol use: No  . Drug use: No  . Sexual activity: Not on file  Lifestyle  . Physical activity:    Days per week: Not on file    Minutes per session: Not on file  . Stress: Not on file  Relationships  . Social connections:    Talks on phone: Not on file    Gets together: Not on file    Attends religious service: Not on file    Active member of club or organization: Not on file    Attends meetings of clubs or organizations: Not on file  Relationship status: Not on file  . Intimate partner violence:    Fear of current or ex partner: Not on file    Emotionally abused: Not on file    Physically abused: Not on file    Forced sexual activity: Not on file  Other Topics Concern  . Not on file  Social History Narrative   He works for [employer]   He lives at home with wife.     Highest level of education:  BS, business admin    Current Outpatient Medications on File Prior to Visit  Medication Sig Dispense Refill  . acetaminophen (TYLENOL) 325 MG tablet Take 2 tablets (650 mg total) by mouth every 6 (six) hours as needed for mild pain or headache.    Marland Kitchen amitriptyline (ELAVIL) 25 MG tablet     . amitriptyline (ELAVIL)  75 MG tablet TAKE 1 TABLET(75 MG) BY MOUTH AT BEDTIME 30 tablet 1  . B Complex Vitamins (B-COMPLEX/B-12 PO) Take by mouth.    . baclofen (LIORESAL) 10 MG tablet Take 2 tablets (20 mg total) by mouth 3 (three) times daily. 360 each 3  . busPIRone (BUSPAR) 30 MG tablet TAKE ONE-HALF (1/2) TABLET (15 MG) TWICE A DAY 180 tablet 1  . celecoxib (CELEBREX) 100 MG capsule Take 1 capsule (100 mg total) by mouth at bedtime. 30 capsule 2  . Coenzyme Q10 (COQ10) 200 MG CAPS Take by mouth.    . Cranberry 400 MG TABS Take 2 each by mouth daily.      . Cyanocobalamin (VITAMIN B-12 CR PO) Take 1 each by mouth daily.      . DULoxetine (CYMBALTA) 60 MG capsule TAKE 1 CAPSULE DAILY 90 capsule 4  . finasteride (PROSCAR) 5 MG tablet Take 5 mg by mouth daily.     Marland Kitchen glucagon 1 MG injection Inject 1 mg into the vein once as needed. 1 each 12  . glucose blood (BAYER CONTOUR NEXT TEST) test strip MEDICALLY NECESSARY FOR USE WITH PUMP; Use to check blood sugar 5 times per day and prn; E11.42 500 each 3  . HUMALOG 100 UNIT/ML injection INJECT 120 UNITS DAILY IN PUMP 100 mL 4  . Insulin Infusion Pump Supplies (PARADIGM RESERVOIR 3ML) MISC 1 Device by Does not apply route every 3 (three) days. 30 each 3  . Insulin Infusion Pump Supplies (QUICK-SET INFUSION 43" 9MM) MISC 1 Device by Does not apply route every 3 (three) days. 30 each 3  . lisinopril-hydrochlorothiazide (PRINZIDE,ZESTORETIC) 10-12.5 MG tablet Take 1 tablet by mouth once daily. Please see PCP for any future refills. 90 tablet 0  . Multiple Vitamin (MULTIVITAMIN) capsule Take by mouth.    . Multiple Vitamins-Minerals (MULTIVITAMIN,TX-MINERALS) tablet Take 1 tablet by mouth daily.      Marland Kitchen scopolamine (TRANSDERM-SCOP) 1 MG/3DAYS Place 1 patch (1.5 mg total) onto the skin every 3 (three) days. 10 patch 12   No current facility-administered medications on file prior to visit.     No Known Allergies  Family History  Problem Relation Age of Onset  . Dementia  Mother   . Diabetes Mellitus I Mother   . Hypertension Father        65  . Healthy Sister   . Rheum arthritis Brother   . Cancer Neg Hx   . Kenneth cancer Neg Hx   . Esophageal cancer Neg Hx   . Stomach cancer Neg Hx   . Rectal cancer Neg Hx   . Kenneth polyps  Neg Hx     BP 120/74 (BP Location: Left Arm, Patient Position: Sitting, Cuff Size: Large)   Pulse 73   Ht 6' (1.829 m)   Wt 240 lb 3.2 oz (109 kg)   SpO2 94%   BMI 32.58 kg/m     Review of Systems He denies hypoglycemia    Objective:   Physical Exam VITAL SIGNS:  See vs page GENERAL: no distress Pulses: dorsalis pedis intact bilat.   MSK: no deformity of the feet CV: 1+ bilat leg edema Skin:  no ulcer on the feet.  normal color and temp on the feet. Neuro: sensation is intact to touch on the feet Ext: There is bilateral onychomycosis of the toenails.    Lab Results  Component Value Date   HGBA1C 8.5 (A) 04/29/2018       Assessment & Plan:  Type 1 DM, with DR: worse.  Patient Instructions  Please take these pump settings: basal rate of 4.4 units/hr 6 AM-10 PM, and 2.2 units/hr 10 PM-6 AM mealtime bolus of 5 units with each meal.   correction bolus (which some people call "sensitivity," or "insulin sensitivity ratio," or just "isr") of 1 unit for each 50 by which your glucose exceeds 100.  suspend the pump for 1-2 hrs, with activity.  If not, eat a light snack with it.   On this type of insulin schedule, you should eat meals on a regular schedule.  If a meal is missed or significantly delayed, your blood sugar could go low.   Please come back for a follow-up appointment in 2 months.  We are happy to review continuous glucose monitor info in between appointments.

## 2018-04-29 NOTE — Patient Instructions (Addendum)
Please take these pump settings: basal rate of 4.4 units/hr 6 AM-10 PM, and 2.2 units/hr 10 PM-6 AM mealtime bolus of 5 units with each meal.   correction bolus (which some people call "sensitivity," or "insulin sensitivity ratio," or just "isr") of 1 unit for each 50 by which your glucose exceeds 100.  suspend the pump for 1-2 hrs, with activity.  If not, eat a light snack with it.   On this type of insulin schedule, you should eat meals on a regular schedule.  If a meal is missed or significantly delayed, your blood sugar could go low.   Please come back for a follow-up appointment in 2 months.  We are happy to review continuous glucose monitor info in between appointments.   

## 2018-05-06 DIAGNOSIS — E109 Type 1 diabetes mellitus without complications: Secondary | ICD-10-CM | POA: Diagnosis not present

## 2018-05-20 ENCOUNTER — Other Ambulatory Visit: Payer: Self-pay | Admitting: Physical Medicine & Rehabilitation

## 2018-06-10 ENCOUNTER — Encounter (HOSPITAL_COMMUNITY): Payer: BLUE CROSS/BLUE SHIELD

## 2018-06-20 ENCOUNTER — Other Ambulatory Visit (HOSPITAL_COMMUNITY): Payer: Self-pay | Admitting: *Deleted

## 2018-06-21 ENCOUNTER — Inpatient Hospital Stay (HOSPITAL_COMMUNITY): Admission: RE | Admit: 2018-06-21 | Payer: BLUE CROSS/BLUE SHIELD | Source: Ambulatory Visit

## 2018-06-21 ENCOUNTER — Other Ambulatory Visit: Payer: Self-pay

## 2018-06-21 ENCOUNTER — Ambulatory Visit (HOSPITAL_COMMUNITY)
Admission: RE | Admit: 2018-06-21 | Discharge: 2018-06-21 | Disposition: A | Discharge status: Home / Self Care | Payer: BLUE CROSS/BLUE SHIELD | Source: Ambulatory Visit | Attending: Neurology | Admitting: Neurology

## 2018-06-21 DIAGNOSIS — G6181 Chronic inflammatory demyelinating polyneuritis: Secondary | ICD-10-CM | POA: Insufficient documentation

## 2018-06-21 MED ORDER — IMMUNE GLOBULIN (HUMAN) 20 GM/200ML IV SOLN
1.0000 g/kg | INTRAVENOUS | Status: DC
  Administered 2018-06-21: 110 g via INTRAVENOUS
  Filled 2018-06-21: qty 100

## 2018-06-27 ENCOUNTER — Other Ambulatory Visit: Payer: Self-pay | Admitting: Endocrinology

## 2018-06-27 NOTE — Telephone Encounter (Signed)
Please refill x 3 months Further refills would have to be considered by new PCP   

## 2018-06-30 ENCOUNTER — Encounter: Payer: Self-pay | Admitting: Endocrinology

## 2018-07-01 ENCOUNTER — Other Ambulatory Visit: Payer: Self-pay

## 2018-07-01 ENCOUNTER — Ambulatory Visit (INDEPENDENT_AMBULATORY_CARE_PROVIDER_SITE_OTHER): Payer: BLUE CROSS/BLUE SHIELD | Admitting: Endocrinology

## 2018-07-01 DIAGNOSIS — E10319 Type 1 diabetes mellitus with unspecified diabetic retinopathy without macular edema: Secondary | ICD-10-CM

## 2018-07-01 DIAGNOSIS — E119 Type 2 diabetes mellitus without complications: Secondary | ICD-10-CM

## 2018-07-01 NOTE — Patient Instructions (Signed)
Please take these pump settings: basal rate of 4.4 units/hr 6 AM-10 PM, and 2.2 units/hr 10 PM-6 AM mealtime bolus of 5 units with each meal.   correction bolus (which some people call "sensitivity," or "insulin sensitivity ratio," or just "isr") of 1 unit for each 50 by which your glucose exceeds 100.  suspend the pump for 1-2 hrs, with activity.  If not, eat a light snack with it.   On this type of insulin schedule, you should eat meals on a regular schedule.  If a meal is missed or significantly delayed, your blood sugar could go low.   Please come back for a follow-up appointment in 2 months.  We are happy to review continuous glucose monitor info in between appointments.

## 2018-07-01 NOTE — Progress Notes (Signed)
Subjective:    Patient ID: Kenneth Pace, male    DOB: December 13, 1950, 68 y.o.   MRN: [id number]  HPI  telehealth visit today via doxy video visit.  Alternatives to telehealth are presented to this patient, and the patient agrees to the telehealth visit. Pt is advised of the cost of the visit, and agrees to this, also.   Patient is at home, and I am at the office.   Persons attending the telehealth visit: the patient and I Pt returns for f/u of diabetes mellitus:  DM type: 1 Dx'ed: 6144 Complications: retinopathy and polyneuropathy.   Therapy: insulin since dx.  DKA: only at dx.   Severe hypoglycemia: most recently in 2014.  Pancreatitis: never Other: he has a medtronic pump and continuous glucose monitor; he is retired. continuous glucose monitor ID is [email or web address] and password is [email or web address].  Interval history:  basal rate of 4.4 units/hr 6 AM-10 PM, and 2.6 units/hr 10 PM-6 AM.  mealtime bolus of 2-3 units with each meal (but he seldom takes).   correction bolus (which some people call "sensitivity," or "insulin sensitivity ratio," or just "isr") of 1 unit for each 50 by which your glucose exceeds 100.  suspend the pump for 1-2 hrs, with activity.  If not, eat a light snack with it.   On this type of insulin schedule, you should eat meals on a regular schedule.  If a meal is missed or significantly delayed, your blood sugar could go low.   pt states he feels well in general, except for ankle swelling.  He has mild hypoglycemia approx 1-2 times per week.  This usually happens in the afternoon.  He says cbg is usually highest fasting. He has a renewed dietary effort  Past Medical History:  Diagnosis Date  . ADENOIDECTOMY, HX OF 02/16/2007  . Anxiety    pt. states he does not have anxiety.  Marland Kitchen BENIGN PROSTATIC HYPERTROPHY, WITH OBSTRUCTION 02/03/2010  . DIABETES MELLITUS, TYPE I, UNCONTROLLED 12/29/2006  . ERECTILE DYSFUNCTION 02/16/2007  . HYPERLIPIDEMIA 02/16/2007  .  HYPERTENSION 02/16/2007  . Sleep apnea    uses CPAP  . TESTICULAR MASS, LEFT 02/16/2007    Past Surgical History:  Procedure Laterality Date  . ANTERIOR CERVICAL DECOMP/DISCECTOMY FUSION N/A 02/01/2017   Procedure: ANTERIOR CERVICAL DECOMPRESSION/DISCECTOMY FUSION CERVICAL THREE-FOUR , CERVICAL FOUR-FIVE  CERVICAL FIVE-SIX;  Surgeon: Consuella Lose, MD;  Location: [facility];  Service: Neurosurgery;  Laterality: N/A;  . COLONOSCOPY  02/05/2010   [facility]  . KNEE SURGERY     2 Lknee arthroscopy, 3 R knee arthroscpoy  . KNEE SURGERY Right 11/12/2016  . TONSILLECTOMY      Social History   Socioeconomic History  . Marital status: Married    Spouse name: Not on file  . Number of children: Not on file  . Years of education: Not on file  . Highest education level: Not on file  Occupational History  . 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 Financial resource strain: Not on file  . Food insecurity:    Worry: Not on file    Inability: Not on file  . Transportation needs:    Medical: Not on file    Non-medical: Not on file  Tobacco Use  . Smoking status: Never Smoker  . Smokeless tobacco: Never Used  Substance and Sexual Activity  . Alcohol use: No  . Drug use: No  . Sexual  activity: Not on file  Lifestyle  . Physical activity:    Days per week: Not on file    Minutes per session: Not on file  . Stress: Not on file  Relationships  . Social connections:    Talks on phone: Not on file    Gets together: Not on file    Attends religious service: Not on file    Active member of club or organization: Not on file    Attends meetings of clubs or organizations: Not on file    Relationship status: Not on file  . Intimate partner violence:    Fear of current or ex partner: Not on file    Emotionally abused: Not on file    Physically abused: Not on file    Forced sexual activity: Not on file  Other Topics Concern   . Not on file  Social History Narrative   He works for [employer]   He lives at home with wife.     Highest level of education:  BS, business admin    Current Outpatient Medications on File Prior to Visit  Medication Sig Dispense Refill  . acetaminophen (TYLENOL) 325 MG tablet Take 2 tablets (650 mg total) by mouth every 6 (six) hours as needed for mild pain or headache.    Marland Kitchen amitriptyline (ELAVIL) 25 MG tablet     . amitriptyline (ELAVIL) 75 MG tablet TAKE 1 TABLET(75 MG) BY MOUTH AT BEDTIME 30 tablet 2  . B Complex Vitamins (B-COMPLEX/B-12 PO) Take by mouth.    . baclofen (LIORESAL) 10 MG tablet Take 2 tablets (20 mg total) by mouth 3 (three) times daily. 360 each 3  . busPIRone (BUSPAR) 30 MG tablet TAKE ONE-HALF (1/2) TABLET (15 MG) TWICE A DAY 180 tablet 1  . celecoxib (CELEBREX) 100 MG capsule Take 1 capsule (100 mg total) by mouth at bedtime. 30 capsule 2  . Coenzyme Q10 (COQ10) 200 MG CAPS Take by mouth.    . Cranberry 400 MG TABS Take 2 each by mouth daily.      . Cyanocobalamin (VITAMIN B-12 CR PO) Take 1 each by mouth daily.      . DULoxetine (CYMBALTA) 60 MG capsule TAKE 1 CAPSULE DAILY 90 capsule 4  . finasteride (PROSCAR) 5 MG tablet Take 5 mg by mouth daily.     Marland Kitchen glucagon 1 MG injection Inject 1 mg into the vein once as needed. 1 each 12  . glucose blood (BAYER CONTOUR NEXT TEST) test strip MEDICALLY NECESSARY FOR USE WITH PUMP; Use to check blood sugar 5 times per day and prn; E11.42 500 each 3  . HUMALOG 100 UNIT/ML injection INJECT 120 UNITS DAILY IN PUMP 100 mL 4  . Insulin Infusion Pump Supplies (PARADIGM RESERVOIR 3ML) MISC 1 Device by Does not apply route every 3 (three) days. 30 each 3  . Insulin Infusion Pump Supplies (QUICK-SET INFUSION 43" 9MM) MISC 1 Device by Does not apply route every 3 (three) days. 30 each 3  . lisinopril-hydrochlorothiazide (ZESTORETIC) 10-12.5 MG tablet TAKE 1 TABLET DAILY, PLEASE SEE PRIMARY CARE PHYSICIAN FOR ANY  FUTURE REFILLS 90 tablet 0  . Multiple Vitamin (MULTIVITAMIN) capsule Take by mouth.    . Multiple Vitamins-Minerals (MULTIVITAMIN,TX-MINERALS) tablet Take 1 tablet by mouth daily.      Marland Kitchen scopolamine (TRANSDERM-SCOP) 1 MG/3DAYS Place 1 patch (1.5 mg total) onto the skin every 3 (three) days. 10 patch 12   No current facility-administered medications on file  prior to visit.     No Known Allergies  Family History  Problem Relation Age of Onset  . Dementia Mother   . Diabetes Mellitus I Mother   . Hypertension Father        46  . Healthy Sister   . Rheum arthritis Brother   . Cancer Neg Hx   . Colon cancer Neg Hx   . Esophageal cancer Neg Hx   . Stomach cancer Neg Hx   . Rectal cancer Neg Hx   . Colon polyps Neg Hx      Review of Systems Denies LOC.      Objective:   Physical Exam      Assessment & Plan:  Type 1 DM, with DR: apparently overcontrolled.    Patient Instructions  Please take these pump settings: basal rate of 4.4 units/hr 6 AM-10 PM, and 2.2 units/hr 10 PM-6 AM mealtime bolus of 5 units with each meal.   correction bolus (which some people call "sensitivity," or "insulin sensitivity ratio," or just "isr") of 1 unit for each 50 by which your glucose exceeds 100.  suspend the pump for 1-2 hrs, with activity.  If not, eat a light snack with it.   On this type of insulin schedule, you should eat meals on a regular schedule.  If a meal is missed or significantly delayed, your blood sugar could go low.   Please come back for a follow-up appointment in 2 months.  We are happy to review continuous glucose monitor info in between appointments.

## 2018-07-13 ENCOUNTER — Other Ambulatory Visit: Payer: Self-pay | Admitting: Physical Medicine & Rehabilitation

## 2018-07-18 ENCOUNTER — Ambulatory Visit: Payer: BLUE CROSS/BLUE SHIELD | Admitting: Physical Medicine & Rehabilitation

## 2018-07-20 ENCOUNTER — Encounter: Payer: Self-pay | Admitting: Neurology

## 2018-07-21 ENCOUNTER — Encounter
Payer: BLUE CROSS/BLUE SHIELD | Attending: Physical Medicine & Rehabilitation | Admitting: Physical Medicine & Rehabilitation

## 2018-07-21 ENCOUNTER — Other Ambulatory Visit: Payer: Self-pay

## 2018-07-21 ENCOUNTER — Encounter: Payer: Self-pay | Admitting: Physical Medicine & Rehabilitation

## 2018-07-21 VITALS — Ht 72.0 in | Wt 237.0 lb

## 2018-07-21 DIAGNOSIS — E1065 Type 1 diabetes mellitus with hyperglycemia: Secondary | ICD-10-CM

## 2018-07-21 DIAGNOSIS — M545 Low back pain, unspecified: Secondary | ICD-10-CM | POA: Insufficient documentation

## 2018-07-21 DIAGNOSIS — M792 Neuralgia and neuritis, unspecified: Secondary | ICD-10-CM

## 2018-07-21 DIAGNOSIS — M791 Myalgia, unspecified site: Secondary | ICD-10-CM

## 2018-07-21 DIAGNOSIS — G959 Disease of spinal cord, unspecified: Secondary | ICD-10-CM

## 2018-07-21 DIAGNOSIS — F411 Generalized anxiety disorder: Secondary | ICD-10-CM

## 2018-07-21 DIAGNOSIS — G6181 Chronic inflammatory demyelinating polyneuritis: Secondary | ICD-10-CM | POA: Diagnosis not present

## 2018-07-21 DIAGNOSIS — M4712 Other spondylosis with myelopathy, cervical region: Secondary | ICD-10-CM

## 2018-07-21 DIAGNOSIS — R269 Unspecified abnormalities of gait and mobility: Secondary | ICD-10-CM

## 2018-07-21 DIAGNOSIS — G8929 Other chronic pain: Secondary | ICD-10-CM

## 2018-07-21 DIAGNOSIS — E119 Type 2 diabetes mellitus without complications: Secondary | ICD-10-CM

## 2018-07-21 NOTE — Progress Notes (Signed)
Subjective:    Patient ID: Kenneth Pace, male    DOB: 05-13-1950, 68 y.o.   MRN: [id number]  TELEHEALTH NOTE  Due to national recommendations of social distancing due to COVID 19, an audio/video telehealth visit is felt to be most appropriate for this patient at this time.  See Chart message from today for the patient's consent to telehealth from [facility].     I verified that I am speaking with the correct person using two identifiers.  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   Last clinic visit 04/13/2018.  Since that time, he continues to do HEP, continues to await gym opening. He notes skin changes. He notes increased stiffness with more sedentary lifestyle since CoVid. He has changed his diet and has had improvement. He is now taking IVIG every 6 weeks. Patient had a fall last week cleaning gutters and tangled with hoses and fell on knees first and then head.  He was able to get up immediately. Is doing well since that time.  He continues to take medications. He still has not tried TENS, reminded again. Reminded again regarding lab work. Back pain has improved overall, but flares with excessive activity.   Pain Inventory Average Pain 2 Pain Right Now 1 My pain is constant and dull  In the last 24 hours, has pain interfered with the following? General activity 2 Relation with others 1 Enjoyment of life 1 What TIME of day is your pain at its worst? evening Sleep?   Good Pain is worse with: bending and some  activites Pain improves with: rest, heat/ice and medication Relief from Meds: 6  Mobility walk without assistance walk with assistance use a cane ability to climb steps?  yes do you drive?  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  no  Physicians involved in your care Any changes since last visit?  no Neurologist Dr. Vick Frees)   Family History  Problem Relation Age of Onset  . Dementia Mother   . Diabetes Mellitus I Mother   . Hypertension Father        60  . Healthy Sister   . Rheum arthritis Brother   . Cancer Neg Hx   . Colon cancer Neg Hx   . Esophageal cancer Neg Hx   . Stomach cancer Neg Hx   . Rectal cancer Neg Hx   . Colon polyps Neg Hx    Social History   Socioeconomic History  . Marital status: Married    Spouse name: Not on file  . Number of children: Not on file  . Years of education: Not on file  . Highest education level: Not on file  Occupational History  . 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 Financial resource strain: Not on file  . Food insecurity:    Worry: Not on file    Inability: Not on file  . Transportation  needs:    Medical: Not on file    Non-medical: Not on file  Tobacco Use  . Smoking status: Never Smoker  . Smokeless tobacco: Never Used  Substance and Sexual Activity  . Alcohol use: No  . Drug use: No  . Sexual activity: Not on file  Lifestyle  . Physical activity:    Days per week: Not on file    Minutes per session: Not on file  . Stress: Not on file  Relationships  . Social connections:    Talks on phone: Not on file    Gets together: Not on file    Attends religious service: Not on file    Active member of club or organization: Not on file    Attends meetings of clubs or organizations: Not on file     Relationship status: Not on file  Other Topics Concern  . Not on file  Social History Narrative   He works for [employer]   He lives at home with wife.     Highest level of education:  BS, business admin   Past Surgical History:  Procedure Laterality Date  . ANTERIOR CERVICAL DECOMP/DISCECTOMY FUSION N/A 02/01/2017   Procedure: ANTERIOR CERVICAL DECOMPRESSION/DISCECTOMY FUSION CERVICAL THREE-FOUR , CERVICAL FOUR-FIVE  CERVICAL FIVE-SIX;  Surgeon: Consuella Lose, MD;  Location: [facility];  Service: Neurosurgery;  Laterality: N/A;  . COLONOSCOPY  02/05/2010   [facility]  . KNEE SURGERY     2 Lknee arthroscopy, 3 R knee arthroscpoy  . KNEE SURGERY Right 11/12/2016  . TONSILLECTOMY     Past Medical History:  Diagnosis Date  . ADENOIDECTOMY, HX OF 02/16/2007  . Anxiety    pt. states he does not have anxiety.  Marland Kitchen BENIGN PROSTATIC HYPERTROPHY, WITH OBSTRUCTION 02/03/2010  . DIABETES MELLITUS, TYPE I, UNCONTROLLED 12/29/2006  . ERECTILE DYSFUNCTION 02/16/2007  . HYPERLIPIDEMIA 02/16/2007  . HYPERTENSION 02/16/2007  . Sleep apnea    uses CPAP  . TESTICULAR MASS, LEFT 02/16/2007   Ht 6' (1.829 m)   Wt 237 lb (107.5 kg)   BMI 32.14 kg/m   Opioid Risk Score:   Fall Risk Score:  `1  Depression screen PHQ 2/9  Depression screen [facility] 2/9 12/10/2017 07/07/2017 02/26/2017 02/26/2017  Decreased Interest 0 0 0 0  Down, Depressed, Hopeless 0 0 0 0  PHQ - 2 Score 0 0 0 0  Altered sleeping - - 1 -  Tired, decreased energy - - 0 -  Change in appetite - - 0 -  Feeling bad or failure about yourself  - - 0 -  Trouble concentrating - - 0 -  Moving slowly or fidgety/restless - - 1 -  Suicidal thoughts - - 0 -  PHQ-9 Score - - 2 -  Difficult doing work/chores - - Not difficult at all -  Some recent data might be hidden   Review of Systems  Constitutional: Negative.   HENT: Negative.   Eyes: Negative.   Respiratory: Negative.   Cardiovascular: Positive for leg  swelling.  Gastrointestinal: Negative.   Endocrine: Negative.   Genitourinary: Negative.   Musculoskeletal: Positive for arthralgias, back pain, joint swelling and myalgias.  Skin: Negative.   Allergic/Immunologic: Negative.   Neurological: Positive for weakness and numbness.       Tingling   Hematological: Negative.   Psychiatric/Behavioral: Negative.   All other systems reviewed and are negative.     Objective:   Physical Exam Gen: NAD. 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   1.  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  Cont HEP, encouraged again  Cont follow up Neurology -IVIG per Neurology  Cont work   2.  Pain Management:   Gabapentin and Lyrica d/ced due to edema  No benefit with Mobic, Naproxen  Cont Lidoderm patch  Cont TENS, reminded again (x2)  Cont Elavil to 75 qhs   Cont Cymbalta to 60mg  with food  Cont Baclofen per Neurology  Cont Tylenol  Cont Celebrex 100 qhs - encouraged bring in lab work, reminded again  May consider Robaxin due to lethargy with Baclofen at higher doses  3. Gait abnormality  Cont HEP  Cont cane  4. Mechanical low back - multifactorial  Myalgia +/- facet arthropathy  MRI L-spine reviewed, multilevel predominantly foraminal and facet arthropathy  Cont Balcofen per Neurology  Cont Cymbalta   Will consider facet injections in future if necessary, overall controlled   5. Myalgia  Good benefit with TP injections  Does not feel like he needs at present  See #1

## 2018-07-22 ENCOUNTER — Other Ambulatory Visit: Payer: Self-pay

## 2018-07-22 ENCOUNTER — Telehealth (INDEPENDENT_AMBULATORY_CARE_PROVIDER_SITE_OTHER): Payer: BLUE CROSS/BLUE SHIELD | Admitting: Neurology

## 2018-07-22 VITALS — Ht 72.0 in | Wt 237.0 lb

## 2018-07-22 DIAGNOSIS — G6181 Chronic inflammatory demyelinating polyneuritis: Secondary | ICD-10-CM

## 2018-07-22 DIAGNOSIS — R261 Paralytic gait: Secondary | ICD-10-CM

## 2018-07-22 DIAGNOSIS — G959 Disease of spinal cord, unspecified: Secondary | ICD-10-CM

## 2018-07-22 NOTE — Addendum Note (Signed)
Addended by: Amado Coe on: 07/22/2018 11:07 AM   Modules accepted: Orders

## 2018-07-22 NOTE — Progress Notes (Signed)
   Virtual Visit via Video Note The purpose of this virtual visit is to provide medical care while limiting exposure to the novel coronavirus.    Consent was obtained for video visit:  Yes.   Answered questions that patient had about telehealth interaction:  Yes.   I discussed the limitations, risks, security and privacy concerns of performing an evaluation and management service by telemedicine. I also discussed with the patient that there may be a patient responsible charge related to this service. The patient expressed understanding and agreed to proceed.  Pt location: Home Physician Location: office Name of referring provider:  Renato Shin, MD I connected with Noel Journey at patients initiation/request on 07/22/2018 at 10:50 AM EDT by video enabled telemedicine application and verified that I am speaking with the correct person using two identifiers. Pt MRN:  [id number] Pt DOB:  1950/10/05 Video Participants:  Noel Journey   History of Present Illness: This is a 68 y.o. male with insulin-dependent diabetes mellitus, hypertension, OSA on CPAP,hyperlipidemia, anxiety, and cervical myelopathy s/p decompression at C3-C6 returning for follow-up of CIDP.  Due to COVID-19, he has been working from home and admits to being much more sedentary.  Unfortunately, this has caused his arms and legs to be more stiff, which is worse when he initiates movements and in the morning.  Numbness and tingling of the hands and feet is stable.  He continues to get IVIG every 6 weeks and is tolerating this well.    Observations/Objective:   Vitals:   07/20/18 0910  Weight: 237 lb (107.5 kg)  Height: 6' (1.829 m)   Patient is awake, alert, and appears comfortable.  Oriented x 4.   Extraocular muscles are intact. Kenneth Pace ptosis.  Face is symmetric.  Speech is not dysarthric. Tongue is midline. Antigravity in all extremities. Gait is spastic, unassisted and somewhat unsteady.   Assessment and  Plan:  1.  Chronic inflammatory demyelinating polyradiculoneuropathy, diagnosed October 2018.  Initially treated with IVIG, followed by plasmapheresis.  Currently, he is on IVIG every 6 weeks which has helped with his paresthesias.  I will continue him on every 6 weeks and plan to taper in the future.  2.  Cervical myelopathy s/p decompression at C3-6 with residual spasticity.  Continue baclofen 20 mg 3 times daily. Start outpatient physical therapy for spasticity.   Appreciate PM&R following for pain management.     Follow Up Instructions:   I discussed the assessment and treatment plan with the patient. The patient was provided an opportunity to ask questions and all were answered. The patient agreed with the plan and demonstrated an understanding of the instructions.   The patient was advised to call back or seek an in-person evaluation if the symptoms worsen or if the condition fails to improve as anticipated.  Follow-up in 4 months  Total time spent:  25 minutes     Alda Berthold, DO

## 2018-07-25 ENCOUNTER — Ambulatory Visit: Payer: BLUE CROSS/BLUE SHIELD | Admitting: Neurology

## 2018-07-28 ENCOUNTER — Ambulatory Visit (INDEPENDENT_AMBULATORY_CARE_PROVIDER_SITE_OTHER): Payer: BLUE CROSS/BLUE SHIELD | Admitting: Physical Therapy

## 2018-07-28 ENCOUNTER — Other Ambulatory Visit: Payer: Self-pay

## 2018-07-28 ENCOUNTER — Encounter: Payer: Self-pay | Admitting: Physical Therapy

## 2018-07-28 DIAGNOSIS — R2689 Other abnormalities of gait and mobility: Secondary | ICD-10-CM | POA: Diagnosis not present

## 2018-07-28 DIAGNOSIS — R2681 Unsteadiness on feet: Secondary | ICD-10-CM

## 2018-07-28 DIAGNOSIS — M6281 Muscle weakness (generalized): Secondary | ICD-10-CM | POA: Diagnosis not present

## 2018-07-28 NOTE — Therapy (Signed)
[facility] [place], [place], [address] Phone: [phone]   Fax:  [phone]  Physical Therapy Evaluation  Patient Details  Name: Kenneth Pace MRN: [id number] Date of Birth: Jul 14, 1950 Referring Provider (PT): Narda Amber, DO   Encounter Date: 07/28/2018  PT End of Session - 07/28/18 1615    Visit Number  1    Number of Visits  12    Date for PT Re-Evaluation  09/08/18    PT Start Time  2876    PT Stop Time  1610    PT Time Calculation (min)  40 min    Activity Tolerance  Patient tolerated treatment well    Behavior During Therapy  [facility] for tasks assessed/performed       Past Medical History:  Diagnosis Date  . ADENOIDECTOMY, HX OF 02/16/2007  . Anxiety    pt. states he does not have anxiety.  Marland Kitchen BENIGN PROSTATIC HYPERTROPHY, WITH OBSTRUCTION 02/03/2010  . DIABETES MELLITUS, TYPE I, UNCONTROLLED 12/29/2006  . ERECTILE DYSFUNCTION 02/16/2007  . HYPERLIPIDEMIA 02/16/2007  . HYPERTENSION 02/16/2007  . Sleep apnea    uses CPAP  . TESTICULAR MASS, LEFT 02/16/2007    Past Surgical History:  Procedure Laterality Date  . ANTERIOR CERVICAL DECOMP/DISCECTOMY FUSION N/A 02/01/2017   Procedure: ANTERIOR CERVICAL DECOMPRESSION/DISCECTOMY FUSION CERVICAL THREE-FOUR , CERVICAL FOUR-FIVE  CERVICAL FIVE-SIX;  Surgeon: Consuella Lose, MD;  Location: [facility];  Service: Neurosurgery;  Laterality: N/A;  . COLONOSCOPY  02/05/2010   [facility]  . KNEE SURGERY     2 Lknee arthroscopy, 3 R knee arthroscpoy  . KNEE SURGERY Right 11/12/2016  . TONSILLECTOMY      There were no vitals filed for this visit.   Subjective Assessment - 07/28/18 1532    Subjective  Pt is a 68 y/o male who presents to OPPT for CIDP.  Pt admitted to CIR in 2018 for GBS as well as cord compression requiring ACDF, then upon d/c went to [facility] for outpatient rehab from Jan 2019-March 2019.  Pt returned to work, and now working  from home reporting increased stiffness and decline in function.  Pt reports balance is impaired as well.  Pt given HEP from [facility] and reports not consistently performing, c/o pain and slow progress as reasons for not completing.    Limitations  Walking;Standing    Patient Stated Goals  learn exercises, gain strength    Currently in Pain?  Yes    Pain Score  3     Pain Location  Back    Pain Orientation  Lower;Right;Left    Pain Descriptors / Indicators  Sharp;Stabbing    Pain Type  Chronic pain    Pain Onset  More than a month ago    Pain Frequency  Intermittent    Aggravating Factors   bending down or forward    Pain Relieving Factors  TENS, medication    Effect of Pain on Daily Activities  will monitor but will not directly address         [facility] PT Assessment - 07/28/18 1541      Assessment   Medical Diagnosis  CIDP; spastic gait    Referring Provider (PT)  Narda Amber, DO    Onset Date/Surgical Date  12/29/16    Hand Dominance  Right    Next MD Visit  02/02/2019    Prior Therapy  CIR/OPPT at [facility]      Precautions   Precautions  Fall  Restrictions   Weight Bearing Restrictions  No      Balance Screen   Has the patient fallen in the past 6 months  Yes    How many times?  multiple near falls, 3 actual falls    Has the patient had a decrease in activity level because of a fear of falling?   Yes    Is the patient reluctant to leave their home because of a fear of falling?   No      Home Environment   Living Environment  Private residence    Living Arrangements  Spouse/significant other    Available Help at Discharge  Family    Type of [place] to enter    Entrance Stairs-Number of Steps  2    Entrance Stairs-Rails  None    Home Layout  Two level;Bed/bath upstairs    Alternate Level Stairs-Number of Steps  17    Alternate Level Stairs-Rails  Can reach both;Right;Left    [place] - single point;Walker - 2 wheels;Walker - 4  wheels      Prior Function   Level of Independence  Independent with community mobility with device;Requires assistive device for independence    Vocation  Full time employment    Vocation Requirements  [employer] - Immunologist, computer work all day; currently working from home    Leisure  enjoys working in Teacher, English as a foreign language   Overall Cognitive Status  Within [place] for tasks assessed      Tone   Assessment Location  Right Lower Extremity;Left Lower Extremity      ROM / Strength   AROM / PROM / Strength  Strength      Strength   Strength Assessment Site  Hip;Knee;Ankle    Right/Left Hip  Right;Left    Right Hip Flexion  3/5    Left Hip Flexion  3/5    Right/Left Knee  Right;Left    Right Knee Flexion  4/5    Right Knee Extension  4/5    Left Knee Flexion  4/5    Left Knee Extension  4/5    Right/Left Ankle  Right;Left    Right Ankle Dorsiflexion  5/5    Left Ankle Dorsiflexion  5/5      Flexibility   Soft Tissue Assessment /Muscle Length  yes    Hamstrings  tightness bil    Piriformis  tightness bil    Quadratus Lumborum  tightness bil      Ambulation/Gait   Ambulation/Gait  Yes    Ambulation/Gait Assistance  5: Supervision    Ambulation Distance (Feet)  100 Feet    Assistive device  Straight cane    Gait Pattern  Decreased hip/knee flexion - right;Decreased hip/knee flexion - left;Poor foot clearance - left;Poor foot clearance - right    Ambulation Surface  Level;Indoor    Gait Comments  decreased trunk rotation      Standardized Balance Assessment   Standardized Balance Assessment  Berg Balance Test;Timed Up and Go Test      Berg Balance Test   Sit to Stand  Able to stand without using hands and stabilize independently    Standing Unsupported  Able to stand safely 2 minutes    Sitting with Back Unsupported but Feet Supported on Floor or Stool  Able to sit safely and securely 2 minutes    Stand to Sit  Controls descent  by using hands     Transfers  Able to transfer safely, minor use of hands    Standing Unsupported with Eyes Closed  Able to stand 10 seconds with supervision    Standing Unsupported with Feet Together  Able to place feet together independently and stand 1 minute safely    From Standing, Reach Forward with Outstretched Arm  Can reach confidently >25 cm (10")    From Standing Position, Pick up Object from Floor  Able to pick up shoe, needs supervision    From Standing Position, Turn to Look Behind Over each Shoulder  Looks behind from both sides and weight shifts well    Turn 360 Degrees  Able to turn 360 degrees safely but slowly    Standing Unsupported, Alternately Place Feet on Step/Stool  Able to complete >2 steps/needs minimal assist    Standing Unsupported, One Foot in Front  Able to place foot tandem independently and hold 30 seconds    Standing on One Leg  Tries to lift leg/unable to hold 3 seconds but remains standing independently    Total Score  45      Timed Up and Go Test   Normal TUG (seconds)  15.1    Manual TUG (seconds)  14.86    Cognitive TUG (seconds)  15.56      RLE Tone   RLE Tone  Mild;Modified Ashworth      RLE Tone   Modified Ashworth Scale for Grading Hypertonia RLE  Slight increase in muscle tone, manifested by a catch and release or by minimal resistance at the end of the range of motion when the affected part(s) is moved in flexion or extension      LLE Tone   LLE Tone  Mild;Modified Ashworth      LLE Tone   Modified Ashworth Scale for Grading Hypertonia LLE  Slight increase in muscle tone, manifested by a catch, followed by minimal resistance throughout the remainder (less than half) of the ROM                Objective measurements completed on examination: See above findings.              PT Education - 07/28/18 1614    Education Details  clinical findings, POC, goals of care    Person(s) Educated  Patient    Methods  Explanation    Comprehension   Verbalized understanding          PT Long Term Goals - 07/28/18 1619      PT LONG TERM GOAL #1   Title  independent with HEP    Status  New    Target Date  09/08/18      PT LONG TERM GOAL #2   Title  improve BERG balance score to >/= 51/56 for improved balance    Status  New    Target Date  09/08/18      PT LONG TERM GOAL #3   Title  improve timed up and go to < 13 sec for improved mobility and decreased fall risk    Status  New    Target Date  09/08/18      PT LONG TERM GOAL #4   Title  amb > 300' modified independent without LOB for improved mobility and safety    Status  New    Target Date  09/08/18             Plan - 07/28/18 1615  Clinical Impression Statement  Pt is a 68 y/o male who presents to OPPT with hx of GBS with CIDP as well as cervical myelopathy s/p ACDF in late 2018.  Pt went to CIR with OP rehab follow up until March 2019.  Pt reports he was mostly compliant with HEP and community fitness until [place] pandemic which closed fitness facility and he is now working from home.  Today he demonstrates decreased flexibility, gait abnormalities, decreased strength and balance affecting functional mobility.  Pt will benefit from PT to maximize function and address deficits listed.    Personal Factors and Comorbidities  Time since onset of injury/illness/exacerbation;Comorbidity 2    Comorbidities  ACDF, CIDP    Examination-Activity Limitations  Locomotion Level;Transfers;Bed Mobility;Stand    Examination-Participation Restrictions  Other   work   Stability/Clinical Decision Making  Evolving/Moderate complexity    Clinical Decision Making  Moderate    Rehab Potential  Good    PT Frequency  2x / week    PT Duration  6 weeks    PT Treatment/Interventions  ADLs/Self Care Home Management;Cryotherapy;Electrical Stimulation;Ultrasound;Moist Heat;Gait training;Stair training;Functional mobility training;Therapeutic activities;Therapeutic exercise;Balance  training;Patient/family education;Neuromuscular re-education;Manual techniques;Taping;Vestibular    PT Next Visit Plan  establish HEP for flexibility, strengthening, balance activities    Consulted and Agree with Plan of Care  Patient       Patient will benefit from skilled therapeutic intervention in order to improve the following deficits and impairments:  Abnormal gait, Difficulty walking, Impaired tone, Increased muscle spasms, Decreased activity tolerance, Pain, Impaired flexibility, Decreased balance, Decreased mobility, Decreased strength(will monitor pain but will not directly address)  Visit Diagnosis: Unsteadiness on feet - Plan: PT plan of care cert/re-cert  Muscle weakness (generalized) - Plan: PT plan of care cert/re-cert  Other abnormalities of gait and mobility - Plan: PT plan of care cert/re-cert     Problem List Patient Active Problem List   Diagnosis Date Noted  . Chronic bilateral low back pain without sciatica 07/21/2018  . 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 CIDP (chronic inflammatory demyelinating polyneuropathy) ([place]) 12/30/2017  . Edema 03/01/2017  . Hypokalemia   . Slow transit constipation   . Steroid-induced hyperglycemia   . Urinary retention   . Accidental drug overdose   . Postoperative pain   . Hyponatremia   . Cervical myelopathy ([place]) 02/01/2017  . Shortness of breath at rest   . Hypoglycemia   . Spondylosis, cervical, with myelopathy   . Neuropathic pain   . Benign essential HTN   . Labile blood glucose   . Muscle spasm   . Generalized anxiety disorder   . Acute inflammatory demyelinating polyneuropathy ([place]) 01/11/2017  . Anxiety state   . Neurogenic bladder   . Depression 12/30/2016  . Leg weakness, bilateral 12/30/2016  . 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[place]) 12/30/2016  . 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 AIDP (acute inflammatory demyelinating polyneuropathy) ([place]) 11/27/2016  . Weakness  11/20/2016  . Weakness of both arms 10/30/2016  . 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 Obstructive sleep apnea 03/24/2016  . Numbness 03/18/2016  . Mild nonproliferative diabetic retinopathy of both eyes without macular edema associated with type 2 diabetes mellitus ([place]) 05/24/2015  . Nuclear cataract of both eyes 05/24/2015  . Diabetes mellitus without complication ([place]) 74/25/9563  . 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 Eustachian tube dysfunction 02/12/2015  . Acute maxillary sinusitis 02/12/2015  . Wellness examination 08/22/2014  . Alkaline phosphatase elevation 08/22/2014  . 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 Benign non-nodular prostatic hyperplasia with lower urinary tract symptoms 01/31/2014  . Lesion of eyelid 09/26/2013  . Screening for prostate cancer 04/24/2013  . Diabetes ([place]) 06/16/2012  . ED (erectile dysfunction) of organic origin 06/03/2012  . Routine general medical examination at a health care facility 04/10/2012  . BPH (benign prostatic hyperplasia) 02/03/2010  . HEMOCCULT POSITIVE STOOL 05/25/2008  . ELBOW PAIN, RIGHT 07/04/2007  . Dyslipidemia 02/16/2007  . ANXIETY 02/16/2007  . ERECTILE DYSFUNCTION 02/16/2007  . Essential hypertension 02/16/2007  . TESTICULAR MASS, LEFT 02/16/2007  . KNEE PAIN, CHRONIC 02/16/2007  . ADENOIDECTOMY, HX OF 02/16/2007    Siri Cole 07/28/2018, 4:24 PM  [facility] [place] [address] [place], [place], [address] Phone: [phone]   Fax:  [phone]  Name: Kenneth Pace MRN: [id number] Date of Birth: 07-Jun-1950

## 2018-08-02 ENCOUNTER — Ambulatory Visit (HOSPITAL_COMMUNITY)
Admission: RE | Admit: 2018-08-02 | Discharge: 2018-08-02 | Disposition: A | Discharge status: Home / Self Care | Payer: BC Managed Care – PPO | Source: Ambulatory Visit | Attending: Neurology | Admitting: Neurology

## 2018-08-02 ENCOUNTER — Other Ambulatory Visit: Payer: Self-pay

## 2018-08-02 DIAGNOSIS — G6181 Chronic inflammatory demyelinating polyneuritis: Secondary | ICD-10-CM | POA: Diagnosis not present

## 2018-08-02 MED ORDER — IMMUNE GLOBULIN (HUMAN) 20 GM/200ML IV SOLN
1.0000 g/kg | INTRAVENOUS | Status: DC
  Administered 2018-08-02: 110 g via INTRAVENOUS
  Filled 2018-08-02: qty 100

## 2018-08-03 ENCOUNTER — Ambulatory Visit (INDEPENDENT_AMBULATORY_CARE_PROVIDER_SITE_OTHER): Payer: BC Managed Care – PPO | Admitting: Physical Therapy

## 2018-08-03 ENCOUNTER — Other Ambulatory Visit: Payer: Self-pay

## 2018-08-03 DIAGNOSIS — R2689 Other abnormalities of gait and mobility: Secondary | ICD-10-CM

## 2018-08-03 DIAGNOSIS — M6281 Muscle weakness (generalized): Secondary | ICD-10-CM | POA: Diagnosis not present

## 2018-08-03 DIAGNOSIS — R2681 Unsteadiness on feet: Secondary | ICD-10-CM

## 2018-08-03 NOTE — Therapy (Signed)
[place], [place], [address] Phone: [phone]   Fax:  [phone]  Physical Therapy Treatment  Patient Details  Name: Kenneth Pace MRN: [id number] Date of Birth: March 12, 1950 Referring Provider (PT): Narda Amber, DO   Encounter Date: 08/03/2018  PT End of Session - 08/03/18 0807    Visit Number  2    Number of Visits  12    Date for PT Re-Evaluation  09/08/18    PT Start Time  0805    PT Stop Time  0849    PT Time Calculation (min)  44 min    Activity Tolerance  Patient tolerated treatment well    Behavior During Therapy  [facility] for tasks assessed/performed       Past Medical History:  Diagnosis Date  . ADENOIDECTOMY, HX OF 02/16/2007  . Anxiety    pt. states he does not have anxiety.  Marland Kitchen BENIGN PROSTATIC HYPERTROPHY, WITH OBSTRUCTION 02/03/2010  . DIABETES MELLITUS, TYPE I, UNCONTROLLED 12/29/2006  . ERECTILE DYSFUNCTION 02/16/2007  . HYPERLIPIDEMIA 02/16/2007  . HYPERTENSION 02/16/2007  . Sleep apnea    uses CPAP  . TESTICULAR MASS, LEFT 02/16/2007    Past Surgical History:  Procedure Laterality Date  . ANTERIOR CERVICAL DECOMP/DISCECTOMY FUSION N/A 02/01/2017   Procedure: ANTERIOR CERVICAL DECOMPRESSION/DISCECTOMY FUSION CERVICAL THREE-FOUR , CERVICAL FOUR-FIVE  CERVICAL FIVE-SIX;  Surgeon: Consuella Lose, MD;  Location: [place];  Service: Neurosurgery;  Laterality: N/A;  . COLONOSCOPY  02/05/2010   [facility]  . KNEE SURGERY     2 Lknee arthroscopy, 3 R knee arthroscpoy  . KNEE SURGERY Right 11/12/2016  . TONSILLECTOMY      There were no vitals filed for this visit.  Subjective Assessment - 08/03/18 0813    Subjective  Pt reports no new changes since last visit.    Limitations  Walking;Standing    Patient Stated Goals  learn exercises, gain strength    Currently in Pain?  Yes    Pain Score  2     Pain Location  Back    Pain Orientation  Right;Left;Lower    Pain  Descriptors / Indicators  Dull;Aching    Pain Onset  More than a month ago    Aggravating Factors   working outside, bending over    Pain Relieving Factors  sitting still         [facility] PT Assessment - 08/03/18 0001      Assessment   Medical Diagnosis  CIDP; spastic gait    Referring Provider (PT)  Narda Amber, DO    Onset Date/Surgical Date  12/29/16    Hand Dominance  Right    Next MD Visit  02/02/2019    Prior Therapy  CIR/OPPT at [facility] Adult PT Treatment/Exercise - 08/03/18 0001      Lumbar Exercises: Stretches   Passive Hamstring Stretch  Right;Left;3 reps;30 seconds   seated; supine   Passive Hamstring Stretch Limitations  supine preferred    Hip Flexor Stretch  Right;Left;2 reps;30 seconds   seated   Piriformis Stretch  Right;Left;3 reps;30 seconds   seated; supine   Piriformis Stretch Limitations  supine preferred    Gastroc Stretch  Right;Left;3 reps;30 seconds    Other Lumbar Stretch Exercise  midlevel doorway stretch x 3 reps of 20 sec      Lumbar Exercises: Aerobic   Nustep  L4-5: legs only x 6 min  Lumbar Exercises: Seated   Sit to Stand  5 reps   with core engaged   Other Seated Lumbar Exercises  Core engaged x 5 sec x 5 reps      Lumbar Exercises: Supine   Ab Set  10 reps;5 seconds             PT Education - 08/03/18 0841    Education Details  HEP    Person(s) Educated  Patient    Methods  Explanation;Demonstration;Verbal cues;Handout    Comprehension  Verbalized understanding;Returned demonstration          PT Long Term Goals - 07/28/18 1619      PT LONG TERM GOAL #1   Title  independent with HEP    Status  New    Target Date  09/08/18      PT LONG TERM GOAL #2   Title  improve BERG balance score to >/= 51/56 for improved balance    Status  New    Target Date  09/08/18      PT LONG TERM GOAL #3   Title  improve timed up and go to < 13 sec for improved mobility and decreased fall risk    Status  New     Target Date  09/08/18      PT LONG TERM GOAL #4   Title  amb > 300' modified independent without LOB for improved mobility and safety    Status  New    Target Date  09/08/18            Plan - 08/03/18 0859    Clinical Impression Statement  Pt tolerated all stretches well, reporting greater ease with gait afterwards.  He required multiple cues for proper from on core engagement.  Pt reported reduced low back discomfort at end of session and with transitions (when engaging core).  Progressing towards goals    Personal Factors and Comorbidities  Time since onset of injury/illness/exacerbation;Comorbidity 2    Comorbidities  ACDF, CIDP    Examination-Activity Limitations  Locomotion Level;Transfers;Bed Mobility;Stand    Examination-Participation Restrictions  Other   work   Stability/Clinical Decision Making  Evolving/Moderate complexity    Rehab Potential  Good    PT Frequency  2x / week    PT Duration  6 weeks    PT Treatment/Interventions  ADLs/Self Care Home Management;Cryotherapy;Electrical Stimulation;Ultrasound;Moist Heat;Gait training;Stair training;Functional mobility training;Therapeutic activities;Therapeutic exercise;Balance training;Patient/family education;Neuromuscular re-education;Manual techniques;Taping;Vestibular    PT Next Visit Plan  review HEP; add strengthening and balance activities as tolerated.    Consulted and Agree with Plan of Care  Patient       Patient will benefit from skilled therapeutic intervention in order to improve the following deficits and impairments:  Abnormal gait, Difficulty walking, Impaired tone, Increased muscle spasms, Decreased activity tolerance, Pain, Impaired flexibility, Decreased balance, Decreased mobility, Decreased strength(will monitor pain but will not directly address)  Visit Diagnosis: 1. Unsteadiness on feet   2. Muscle weakness (generalized)   3. Other abnormalities of gait and mobility        Problem List Patient  Active Problem List   Diagnosis Date Noted  . Chronic bilateral low back pain without sciatica 07/21/2018  . 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 CIDP (chronic inflammatory demyelinating polyneuropathy) ([place]) 12/30/2017  . Edema 03/01/2017  . Hypokalemia   . Slow transit constipation   . Steroid-induced hyperglycemia   . Urinary retention   . Accidental drug overdose   . Postoperative pain   .  Hyponatremia   . Cervical myelopathy ([place]) 02/01/2017  . Shortness of breath at rest   . Hypoglycemia   . Spondylosis, cervical, with myelopathy   . Neuropathic pain   . Benign essential HTN   . Labile blood glucose   . Muscle spasm   . Generalized anxiety disorder   . Acute inflammatory demyelinating polyneuropathy ([place]) 01/11/2017  . Anxiety state   . Neurogenic bladder   . Depression 12/30/2016  . Leg weakness, bilateral 12/30/2016  . 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 GBS (Guillain Barre syndrome) ([place])   . AIDP (acute inflammatory demyelinating polyneuropathy) ([place]) 11/27/2016  . Weakness 11/20/2016  . Weakness of both arms 10/30/2016  . 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 Obstructive sleep apnea 03/24/2016  . Numbness 03/18/2016  . Mild nonproliferative diabetic retinopathy of both eyes without macular edema associated with type 2 diabetes mellitus ([place]) 05/24/2015  . Nuclear cataract of both eyes 05/24/2015  . Diabetes mellitus without complication ([place]) 91/50/5697  . 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 Eustachian tube dysfunction 02/12/2015  . Acute maxillary sinusitis 02/12/2015  . Wellness examination 08/22/2014  . Alkaline phosphatase elevation 08/22/2014  . 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 Benign non-nodular prostatic hyperplasia with lower urinary tract symptoms 01/31/2014  . Lesion of eyelid 09/26/2013  . Screening for prostate cancer 04/24/2013  . Diabetes ([place]) 06/16/2012  . ED  (erectile dysfunction) of organic origin 06/03/2012  . Routine general medical examination at a health care facility 04/10/2012  . BPH (benign prostatic hyperplasia) 02/03/2010  . HEMOCCULT POSITIVE STOOL 05/25/2008  . ELBOW PAIN, RIGHT 07/04/2007  . Dyslipidemia 02/16/2007  . ANXIETY 02/16/2007  . ERECTILE DYSFUNCTION 02/16/2007  . Essential hypertension 02/16/2007  . TESTICULAR MASS, LEFT 02/16/2007  . KNEE PAIN, CHRONIC 02/16/2007  . Michell Heinrich OF 02/16/2007   Kerin Perna, PTA 08/03/18 9:02 AM  [place], [place], [address] Phone: [phone]   Fax:  [phone]  Name: USBALDO PANNONE MRN: [id number] Date of Birth: 1950/10/28

## 2018-08-03 NOTE — Patient Instructions (Signed)
  Access Code: [email or web address]  URL: [email or web address]  Date: 08/03/2018  Prepared by: Kerin Perna   Exercises  Hooklying Hamstring Stretch with Strap - 3 reps - 1 sets - 30 second hold - 2x daily - 7x weekly  Supine Piriformis Stretch with Foot on Ground - 3 reps - 1 sets - 20 seconds hold - 2x daily - 7x weekly  Gastroc Stretch on Wall - 2-3 reps - 1 sets - 20 hold - 2x daily - 7x weekly  Doorway Pec Stretch at 90 Degrees Abduction - 2-3 reps - 1 sets - 20 hold - 2x daily - 7x weekly  Seated Transversus Abdominis Bracing - 5 reps - 1 sets - 10 hold - 2x daily - 7x weekly

## 2018-08-05 ENCOUNTER — Encounter: Payer: Self-pay | Admitting: Physical Therapy

## 2018-08-05 ENCOUNTER — Ambulatory Visit (INDEPENDENT_AMBULATORY_CARE_PROVIDER_SITE_OTHER): Payer: BC Managed Care – PPO | Admitting: Physical Therapy

## 2018-08-05 ENCOUNTER — Other Ambulatory Visit: Payer: Self-pay

## 2018-08-05 DIAGNOSIS — R2689 Other abnormalities of gait and mobility: Secondary | ICD-10-CM

## 2018-08-05 DIAGNOSIS — M6281 Muscle weakness (generalized): Secondary | ICD-10-CM

## 2018-08-05 DIAGNOSIS — R2681 Unsteadiness on feet: Secondary | ICD-10-CM

## 2018-08-05 NOTE — Therapy (Signed)
[place], [place], [address] Phone: [phone]   Fax:  [phone]  Physical Therapy Treatment  Patient Details  Name: Kenneth Pace MRN: [id number] Date of Birth: 06/08/1950 Referring Provider (PT): Narda Amber, DO   Encounter Date: 08/05/2018  PT End of Session - 08/05/18 0810    Visit Number  3    Number of Visits  12    Date for PT Re-Evaluation  09/08/18    PT Start Time  0805    PT Stop Time  0851    PT Time Calculation (min)  46 min    Activity Tolerance  Patient tolerated treatment well    Behavior During Therapy  [facility] for tasks assessed/performed       Past Medical History:  Diagnosis Date  . ADENOIDECTOMY, HX OF 02/16/2007  . Anxiety    pt. states he does not have anxiety.  Marland Kitchen BENIGN PROSTATIC HYPERTROPHY, WITH OBSTRUCTION 02/03/2010  . DIABETES MELLITUS, TYPE I, UNCONTROLLED 12/29/2006  . ERECTILE DYSFUNCTION 02/16/2007  . HYPERLIPIDEMIA 02/16/2007  . HYPERTENSION 02/16/2007  . Sleep apnea    uses CPAP  . TESTICULAR MASS, LEFT 02/16/2007    Past Surgical History:  Procedure Laterality Date  . ANTERIOR CERVICAL DECOMP/DISCECTOMY FUSION N/A 02/01/2017   Procedure: ANTERIOR CERVICAL DECOMPRESSION/DISCECTOMY FUSION CERVICAL THREE-FOUR , CERVICAL FOUR-FIVE  CERVICAL FIVE-SIX;  Surgeon: Consuella Lose, MD;  Location: [place];  Service: Neurosurgery;  Laterality: N/A;  . COLONOSCOPY  02/05/2010   [facility]  . KNEE SURGERY     2 Lknee arthroscopy, 3 R knee arthroscpoy  . KNEE SURGERY Right 11/12/2016  . TONSILLECTOMY      There were no vitals filed for this visit.  Subjective Assessment - 08/05/18 0811    Subjective  Pt reports his legs have been sore since last visit, "like I lifted weights at the gym".    Limitations  Walking;Standing    Patient Stated Goals  learn exercises, gain strength    Currently in Pain?  Yes    Pain Score  3     Pain Location  Leg    Pain Orientation  Right;Left   quads and calves   Pain Descriptors / Indicators  Sore    Pain Onset  More than a month ago    Aggravating Factors   sitting too long    Pain Relieving Factors  time         [facility] PT Assessment - 08/05/18 0001      Assessment   Medical Diagnosis  CIDP; spastic gait    Referring Provider (PT)  Narda Amber, DO    Onset Date/Surgical Date  12/29/16    Hand Dominance  Right    Next MD Visit  02/02/2019    Prior Therapy  CIR/OPPT at [facility] Adult PT Treatment/Exercise - 08/05/18 0001      Lumbar Exercises: Stretches   Passive Hamstring Stretch  Right;Left;30 seconds;2 reps   supine with strap   Lower Trunk Rotation  2 reps;10 seconds   arms in Y   Hip Flexor Stretch  Right;Left;1 rep;20 seconds   seated   Piriformis Stretch  Right;Left;30 seconds;2 reps   supine   Gastroc Stretch  Right;Left;30 seconds;2 reps   runners stretch   Other Lumbar Stretch Exercise  --      Lumbar Exercises: Aerobic   Nustep  L5-6: legs/arms x 6.5 min       Lumbar  Exercises: Standing   Other Standing Lumbar Exercises  Balance:  SLS x 4 trials (up to 3 sec on LLE, up to 5 sec on RLE);  staggered stance with horiz / vertical head turns x 30 sec x 2 reps ( occasional UE to steady)       Lumbar Exercises: Seated   Sit to Stand  5 reps   with core engaged   Other Seated Lumbar Exercises  --      Lumbar Exercises: Supine   Ab Set  10 reps;5 seconds    Clam  10 reps   core engaged.    Bent Knee Raise  5 reps   core engaged.    [place]  --                  PT Long Term Goals - 07/28/18 1619      PT LONG TERM GOAL #1   Title  independent with HEP    Status  New    Target Date  09/08/18      PT LONG TERM GOAL #2   Title  improve BERG balance score to >/= 51/56 for improved balance    Status  New    Target Date  09/08/18      PT LONG TERM GOAL #3   Title  improve timed up and go to < 13 sec for improved mobility and decreased fall  risk    Status  New    Target Date  09/08/18      PT LONG TERM GOAL #4   Title  amb > 300' modified independent without LOB for improved mobility and safety    Status  New    Target Date  09/08/18            Plan - 08/05/18 0833    Clinical Impression Statement  Pt tolerated all exercises well, without increase in pain.  Pt had some challenge with keeping core engaged during supine exercises and SLS balance.  Goals are ongoing.    Personal Factors and Comorbidities  Time since onset of injury/illness/exacerbation;Comorbidity 2    Comorbidities  ACDF, CIDP    Examination-Activity Limitations  Locomotion Level;Transfers;Bed Mobility;Stand    Examination-Participation Restrictions  Other   work   Stability/Clinical Decision Making  Evolving/Moderate complexity    Rehab Potential  Good    PT Frequency  2x / week    PT Duration  6 weeks    PT Treatment/Interventions  ADLs/Self Care Home Management;Cryotherapy;Electrical Stimulation;Ultrasound;Moist Heat;Gait training;Stair training;Functional mobility training;Therapeutic activities;Therapeutic exercise;Balance training;Patient/family education;Neuromuscular re-education;Manual techniques;Taping;Vestibular    PT Next Visit Plan  trial stabilizer (pressure biofeedback) with core exercises.  continue adding balance exercises to HEP    Consulted and Agree with Plan of Care  Patient       Patient will benefit from skilled therapeutic intervention in order to improve the following deficits and impairments:  Abnormal gait, Difficulty walking, Impaired tone, Increased muscle spasms, Decreased activity tolerance, Pain, Impaired flexibility, Decreased balance, Decreased mobility, Decreased strength(will monitor pain but will not directly address)  Visit Diagnosis: 1. Unsteadiness on feet   2. Muscle weakness (generalized)   3. Other abnormalities of gait and mobility        Problem List Patient Active Problem List   Diagnosis Date  Noted  . Chronic bilateral low back pain without sciatica 07/21/2018  . 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 Edema 03/01/2017  . Hypokalemia   .  Slow transit constipation   . Steroid-induced hyperglycemia   . Urinary retention   . Accidental drug overdose   . Postoperative pain   . Hyponatremia   . Cervical myelopathy ([place]) 02/01/2017  . Shortness of breath at rest   . Hypoglycemia   . Spondylosis, cervical, with myelopathy   . Neuropathic pain   . Benign essential HTN   . Labile blood glucose   . Muscle spasm   . Generalized anxiety disorder   . Acute inflammatory demyelinating polyneuropathy ([place]) 01/11/2017  . Anxiety state   . Neurogenic bladder   . Depression 12/30/2016  . Leg weakness, bilateral 12/30/2016  . 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 GBS (Guillain Barre syndrome) ([place])   . AIDP (acute inflammatory demyelinating polyneuropathy) ([place]) 11/27/2016  . Weakness 11/20/2016  . Weakness of both arms 10/30/2016  . 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 Obstructive sleep apnea 03/24/2016  . Numbness 03/18/2016  . Mild nonproliferative diabetic retinopathy of both eyes without macular edema associated with type 2 diabetes mellitus ([place]) 05/24/2015  . Nuclear cataract of both eyes 05/24/2015  . Diabetes mellitus without complication ([place]) 73/22/0254  . 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 Eustachian tube dysfunction 02/12/2015  . Acute maxillary sinusitis 02/12/2015  . Wellness examination 08/22/2014  . Alkaline phosphatase elevation 08/22/2014  . 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 Benign non-nodular prostatic hyperplasia with lower urinary tract symptoms 01/31/2014  . Lesion of eyelid 09/26/2013  . Screening for prostate cancer 04/24/2013  . Diabetes ([place]) 06/16/2012  . ED (erectile dysfunction) of organic  origin 06/03/2012  . Routine general medical examination at a health care facility 04/10/2012  . BPH (benign prostatic hyperplasia) 02/03/2010  . HEMOCCULT POSITIVE STOOL 05/25/2008  . ELBOW PAIN, RIGHT 07/04/2007  . Dyslipidemia 02/16/2007  . ANXIETY 02/16/2007  . ERECTILE DYSFUNCTION 02/16/2007  . Essential hypertension 02/16/2007  . TESTICULAR MASS, LEFT 02/16/2007  . KNEE PAIN, CHRONIC 02/16/2007  . Michell Heinrich OF 02/16/2007   Kerin Perna, PTA 08/05/18 8:53 AM  [facility] [place], [place], [address] Phone: [phone]   Fax:  [phone]  Name: Kenneth Pace MRN: [id number] Date of Birth: 1950/03/03

## 2018-08-09 ENCOUNTER — Other Ambulatory Visit: Payer: Self-pay

## 2018-08-09 ENCOUNTER — Encounter: Payer: Self-pay | Admitting: Physical Therapy

## 2018-08-09 ENCOUNTER — Ambulatory Visit (INDEPENDENT_AMBULATORY_CARE_PROVIDER_SITE_OTHER): Payer: BC Managed Care – PPO | Admitting: Physical Therapy

## 2018-08-09 DIAGNOSIS — R2689 Other abnormalities of gait and mobility: Secondary | ICD-10-CM | POA: Diagnosis not present

## 2018-08-09 DIAGNOSIS — M6281 Muscle weakness (generalized): Secondary | ICD-10-CM

## 2018-08-09 DIAGNOSIS — R2681 Unsteadiness on feet: Secondary | ICD-10-CM | POA: Diagnosis not present

## 2018-08-09 NOTE — Therapy (Signed)
[name] [name], [place], [address] Phone: [phone]   Fax:  [phone]  Physical Therapy Treatment  Patient Details  Name: Kenneth Pace MRN: [id number] Date of Birth: 05-29-1950 Referring Provider (PT): Narda Amber, DO   Encounter Date: 08/09/2018  PT End of Session - 08/09/18 1050    Visit Number  4    Number of Visits  12    Date for PT Re-Evaluation  09/08/18    PT Start Time  8250    PT Stop Time  1137    PT Time Calculation (min)  45 min    Activity Tolerance  Patient tolerated treatment well    Behavior During Therapy  [facility] for tasks assessed/performed       Past Medical History:  Diagnosis Date  . ADENOIDECTOMY, HX OF 02/16/2007  . Anxiety    pt. states he does not have anxiety.  Marland Kitchen BENIGN PROSTATIC HYPERTROPHY, WITH OBSTRUCTION 02/03/2010  . DIABETES MELLITUS, TYPE I, UNCONTROLLED 12/29/2006  . ERECTILE DYSFUNCTION 02/16/2007  . HYPERLIPIDEMIA 02/16/2007  . HYPERTENSION 02/16/2007  . Sleep apnea    uses CPAP  . TESTICULAR MASS, LEFT 02/16/2007    Past Surgical History:  Procedure Laterality Date  . ANTERIOR CERVICAL DECOMP/DISCECTOMY FUSION N/A 02/01/2017   Procedure: ANTERIOR CERVICAL DECOMPRESSION/DISCECTOMY FUSION CERVICAL THREE-FOUR , CERVICAL FOUR-FIVE  CERVICAL FIVE-SIX;  Surgeon: Consuella Lose, MD;  Location: [facility];  Service: Neurosurgery;  Laterality: N/A;  . COLONOSCOPY  02/05/2010   [facility]  . KNEE SURGERY     2 Lknee arthroscopy, 3 R knee arthroscpoy  . KNEE SURGERY Right 11/12/2016  . TONSILLECTOMY      There were no vitals filed for this visit.  Subjective Assessment - 08/09/18 1105    Subjective  Pt notices incremental changes.  He's a little sore in thighs, "maybe I overdid it a little".    Limitations  Walking;Standing    Patient Stated Goals  learn exercises, gain strength    Currently in Pain?  Yes    Pain Score  3     Pain Location  Leg    thighs   Pain Orientation  Anterior;Right;Left    Pain Descriptors / Indicators  Sore    Pain Onset  More than a month ago    Aggravating Factors   over doing it.    Pain Relieving Factors  time         [facility] PT Assessment - 08/09/18 0001      Assessment   Medical Diagnosis  CIDP; spastic gait    Referring Provider (PT)  Narda Amber, DO    Onset Date/Surgical Date  12/29/16    Hand Dominance  Right    Next MD Visit  02/02/2019    Prior Therapy  CIR/OPPT at [facility]      Timed Up and Go Test   Normal TUG (seconds)  13        [facility] Adult PT Treatment/Exercise - 08/09/18 0001      Lumbar Exercises: Stretches   Passive Hamstring Stretch  Right;Left;30 seconds;2 reps   supine with strap   Lower Trunk Rotation  --    Quad Stretch  Right;Left;2 reps;20 seconds   seated, foot under chair.    Piriformis Stretch  Right;Left;30 seconds;2 reps   supine   Gastroc Stretch  Right;Left;30 seconds;2 reps   runners stretch     Lumbar Exercises: Aerobic   Nustep  L5: legs/arms x 5 min  Lumbar Exercises: Standing   Other Standing Lumbar Exercises  Balance: SLS x 1 trial - up to 15 sec on RLE, 9 sec LLE;        Lumbar Exercises: Seated   Sit to Stand  5 reps   with core engaged     Lumbar Exercises: Supine   Ab Set  10 reps;5 seconds    AB Set Limitations  with biofeedback bladder under lower back to assist in keeping neutral spine; difficulty maintaining neutral    Clam  10 reps   core engaged.    Clam Limitations  with biofeedback bladder under lower back to assist in keeping neutral spine    Bent Knee Raise  5 reps   core engaged.    Bent Knee Raise Limitations  with biofeedback bladder under lower back to assist in keeping neutral spine    Bridge  10 reps       Balance Exercises - 08/09/18 1300      Balance Exercises: Standing   SLS  Eyes open;1 rep;15 secs   improved.    Tandem Gait  Intermittent upper extremity support;2 reps    Partial Tandem Stance   Intermittent upper extremity support    Retro Gait  4 reps   10 ft at counter   Sidestepping  2 reps   10 ft R/L at counter            PT Long Term Goals - 07/28/18 1619      PT LONG TERM GOAL #1   Title  independent with HEP    Status  New    Target Date  09/08/18      PT LONG TERM GOAL #2   Title  improve BERG balance score to >/= 51/56 for improved balance    Status  New    Target Date  09/08/18      PT LONG TERM GOAL #3   Title  improve timed up and go to < 13 sec for improved mobility and decreased fall risk    Status  New    Target Date  09/08/18      PT LONG TERM GOAL #4   Title  amb > 300' modified independent without LOB for improved mobility and safety    Status  New    Target Date  09/08/18            Plan - 08/09/18 1051    Clinical Impression Statement  Pt's TUG has improved by 2 seconds.  Pt observed ambulating with slightly smoother movements (including more knee flexion during swing through vs circumduction at hip).  Pt tolerated all exercises well, without increase in pain.  Balance activities completed with CGA for safety, with no overt loss of balance.  Progressing well towards goals.    Personal Factors and Comorbidities  Time since onset of injury/illness/exacerbation;Comorbidity 2    Comorbidities  ACDF, CIDP    Examination-Activity Limitations  Locomotion Level;Transfers;Bed Mobility;Stand    Examination-Participation Restrictions  Other   work   Stability/Clinical Decision Making  Evolving/Moderate complexity    Rehab Potential  Good    PT Frequency  2x / week    PT Duration  6 weeks    PT Treatment/Interventions  ADLs/Self Care Home Management;Cryotherapy;Electrical Stimulation;Ultrasound;Moist Heat;Gait training;Stair training;Functional mobility training;Therapeutic activities;Therapeutic exercise;Balance training;Patient/family education;Neuromuscular re-education;Manual techniques;Taping;Vestibular    PT Next Visit Plan  continue  adding balance exercises to HEP    Consulted and Agree with Plan of Care  Patient  Patient will benefit from skilled therapeutic intervention in order to improve the following deficits and impairments:  Abnormal gait, Difficulty walking, Impaired tone, Increased muscle spasms, Decreased activity tolerance, Pain, Impaired flexibility, Decreased balance, Decreased mobility, Decreased strength(will monitor pain but will not directly address)  Visit Diagnosis: 1. Unsteadiness on feet   2. Muscle weakness (generalized)   3. Other abnormalities of gait and mobility        Problem List Patient Active Problem List   Diagnosis Date Noted  . Chronic bilateral low back pain without sciatica 07/21/2018  . 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 CIDP (chronic inflammatory demyelinating polyneuropathy) ([place]) 12/30/2017  . Edema 03/01/2017  . Hypokalemia   . Slow transit constipation   . Steroid-induced hyperglycemia   . Urinary retention   . Accidental drug overdose   . Postoperative pain   . Hyponatremia   . Cervical myelopathy ([place]) 02/01/2017  . Shortness of breath at rest   . Hypoglycemia   . Spondylosis, cervical, with myelopathy   . Neuropathic pain   . Benign essential HTN   . Labile blood glucose   . Muscle spasm   . Generalized anxiety disorder   . Acute inflammatory demyelinating polyneuropathy ([place]) 01/11/2017  . Anxiety state   . Neurogenic bladder   . Depression 12/30/2016  . Leg weakness, bilateral 12/30/2016  . 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 GBS (Guillain Barre syndrome) ([place])   . AIDP (acute inflammatory demyelinating polyneuropathy) ([place]) 11/27/2016  . Weakness 11/20/2016  . Weakness of both arms 10/30/2016  . 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 Obstructive sleep apnea 03/24/2016  . Numbness 03/18/2016  . 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 Nuclear cataract of both eyes 05/24/2015  . Diabetes mellitus without complication ([place]) 62/70/3500  . 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 Eustachian tube dysfunction 02/12/2015  . Acute maxillary sinusitis 02/12/2015  . Wellness examination 08/22/2014  . Alkaline phosphatase elevation 08/22/2014  . 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 Benign non-nodular prostatic hyperplasia with lower urinary tract symptoms 01/31/2014  . Lesion of eyelid 09/26/2013  . Screening for prostate cancer 04/24/2013  . Diabetes ([place]) 06/16/2012  . ED (erectile dysfunction) of organic origin 06/03/2012  . Routine general medical examination at a health care facility 04/10/2012  . BPH (benign prostatic hyperplasia) 02/03/2010  . HEMOCCULT POSITIVE STOOL 05/25/2008  . ELBOW PAIN, RIGHT 07/04/2007  . Dyslipidemia 02/16/2007  . ANXIETY 02/16/2007  . ERECTILE DYSFUNCTION 02/16/2007  . Essential hypertension 02/16/2007  . TESTICULAR MASS, LEFT 02/16/2007  . KNEE PAIN, CHRONIC 02/16/2007  . Michell Heinrich OF 02/16/2007   Kerin Perna, PTA 08/09/18 1:06 PM  [place], [place], [address] Phone: [phone]   Fax:  [phone]  Name: INOCENTE KRACH MRN: [id number] Date of Birth: [birth year]

## 2018-08-11 ENCOUNTER — Encounter: Payer: Self-pay | Admitting: Physical Therapy

## 2018-08-11 ENCOUNTER — Ambulatory Visit (INDEPENDENT_AMBULATORY_CARE_PROVIDER_SITE_OTHER): Payer: BC Managed Care – PPO | Admitting: Physical Therapy

## 2018-08-11 ENCOUNTER — Other Ambulatory Visit: Payer: Self-pay

## 2018-08-11 DIAGNOSIS — R2689 Other abnormalities of gait and mobility: Secondary | ICD-10-CM | POA: Diagnosis not present

## 2018-08-11 DIAGNOSIS — R2681 Unsteadiness on feet: Secondary | ICD-10-CM

## 2018-08-11 DIAGNOSIS — M6281 Muscle weakness (generalized): Secondary | ICD-10-CM

## 2018-08-11 NOTE — Therapy (Signed)
[place], [place], [address] Phone: [phone]   Fax:  [phone]  Physical Therapy Treatment  Patient Details  Name: Kenneth Pace MRN: [id number] Date of Birth: 02-23-1950 Referring Provider (PT): Narda Amber, DO   Encounter Date: 08/11/2018  PT End of Session - 08/11/18 1622    Visit Number  5    Number of Visits  12    Date for PT Re-Evaluation  09/08/18    PT Start Time  1522    PT Stop Time  1607    PT Time Calculation (min)  45 min    Activity Tolerance  Patient tolerated treatment well    Behavior During Therapy  [facility] for tasks assessed/performed       Past Medical History:  Diagnosis Date  . ADENOIDECTOMY, HX OF 02/16/2007  . Anxiety    pt. states he does not have anxiety.  Marland Kitchen BENIGN PROSTATIC HYPERTROPHY, WITH OBSTRUCTION 02/03/2010  . DIABETES MELLITUS, TYPE I, UNCONTROLLED 12/29/2006  . ERECTILE DYSFUNCTION 02/16/2007  . HYPERLIPIDEMIA 02/16/2007  . HYPERTENSION 02/16/2007  . Sleep apnea    uses CPAP  . TESTICULAR MASS, LEFT 02/16/2007    Past Surgical History:  Procedure Laterality Date  . ANTERIOR CERVICAL DECOMP/DISCECTOMY FUSION N/A 02/01/2017   Procedure: ANTERIOR CERVICAL DECOMPRESSION/DISCECTOMY FUSION CERVICAL THREE-FOUR , CERVICAL FOUR-FIVE  CERVICAL FIVE-SIX;  Surgeon: Consuella Lose, MD;  Location: [facility];  Service: Neurosurgery;  Laterality: N/A;  . COLONOSCOPY  02/05/2010   [facility]  . KNEE SURGERY     2 Lknee arthroscopy, 3 R knee arthroscpoy  . KNEE SURGERY Right 11/12/2016  . TONSILLECTOMY      There were no vitals filed for this visit.  Subjective Assessment - 08/11/18 1526    Subjective  doing well; exercises seem to be helping.  rates pain around 1-2/10    Limitations  Walking;Standing    Patient Stated Goals  learn exercises, gain strength    Currently in Pain?  Yes    Pain Score  2     Pain Location  Back    Pain Orientation   Left;Right;Lower    Pain Descriptors / Indicators  Aching;Dull    Pain Type  Chronic pain    Pain Onset  More than a month ago    Pain Frequency  Intermittent    Effect of Pain on Daily Activities  will monitor but will not directly address                       [facility] Adult PT Treatment/Exercise - 08/11/18 1527      Lumbar Exercises: Stretches   Passive Hamstring Stretch  Right;Left;30 seconds;2 reps   supine with strap   Piriformis Stretch  Right;Left;30 seconds;2 reps   supine     Lumbar Exercises: Aerobic   Nustep  L5 x 8 min; 4 extremities      Lumbar Exercises: Seated   Sit to Stand  10 reps   from chair; LLE on foam     Lumbar Exercises: Supine   Bridge  20 reps;5 seconds   feet on BOSU         Balance Exercises - 08/11/18 1622      Balance Exercises: Standing   Tandem Gait  Forward;Retro;Intermittent upper extremity support;2 reps    Other Standing Exercises  braiding 12' x 2 reps each direction, intermittent UE support  PT Long Term Goals - 07/28/18 1619      PT LONG TERM GOAL #1   Title  independent with HEP    Status  New    Target Date  09/08/18      PT LONG TERM GOAL #2   Title  improve BERG balance score to >/= 51/56 for improved balance    Status  New    Target Date  09/08/18      PT LONG TERM GOAL #3   Title  improve timed up and go to < 13 sec for improved mobility and decreased fall risk    Status  New    Target Date  09/08/18      PT LONG TERM GOAL #4   Title  amb > 300' modified independent without LOB for improved mobility and safety    Status  New    Target Date  09/08/18            Plan - 08/11/18 1623    Clinical Impression Statement  Pt needed intermittent UE support and tactile cues with balance activities and sit to stand.  Pt continues to be motivated and progressing well with PT.    Personal Factors and Comorbidities  Time since onset of injury/illness/exacerbation;Comorbidity 2     Comorbidities  ACDF, CIDP    Examination-Activity Limitations  Locomotion Level;Transfers;Bed Mobility;Stand    Examination-Participation Restrictions  Other   work   Stability/Clinical Decision Making  Evolving/Moderate complexity    Rehab Potential  Good    PT Frequency  2x / week    PT Duration  6 weeks    PT Treatment/Interventions  ADLs/Self Care Home Management;Cryotherapy;Electrical Stimulation;Ultrasound;Moist Heat;Gait training;Stair training;Functional mobility training;Therapeutic activities;Therapeutic exercise;Balance training;Patient/family education;Neuromuscular re-education;Manual techniques;Taping;Vestibular    PT Next Visit Plan  continue adding balance exercises to HEP    Consulted and Agree with Plan of Care  Patient       Patient will benefit from skilled therapeutic intervention in order to improve the following deficits and impairments:  Abnormal gait, Difficulty walking, Impaired tone, Increased muscle spasms, Decreased activity tolerance, Pain, Impaired flexibility, Decreased balance, Decreased mobility, Decreased strength(will monitor pain but will not directly address)  Visit Diagnosis: 1. Unsteadiness on feet   2. Muscle weakness (generalized)   3. Other abnormalities of gait and mobility        Problem List Patient Active Problem List   Diagnosis Date Noted  . Chronic bilateral low back pain without sciatica 07/21/2018  . 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 Edema 03/01/2017  . Hypokalemia   . Slow transit constipation   . Steroid-induced hyperglycemia   . Urinary retention   . Accidental drug overdose   . Postoperative pain   . Hyponatremia   . Cervical myelopathy ([place] ) 02/01/2017  . Shortness of breath at rest   . Hypoglycemia   . Spondylosis, cervical, with myelopathy   . Neuropathic pain   . Benign essential HTN   . Labile blood glucose   . Muscle spasm   .  Generalized anxiety disorder   . Acute inflammatory demyelinating polyneuropathy ([place]) 01/11/2017  . Anxiety state   . Neurogenic bladder   . Depression 12/30/2016  . Leg weakness, bilateral 12/30/2016  . 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 GBS (Guillain Barre syndrome) ([place])   . AIDP (acute inflammatory demyelinating polyneuropathy) ([place]) 11/27/2016  . Weakness 11/20/2016  . Weakness of both arms 10/30/2016  .  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 Obstructive sleep apnea 03/24/2016  . Numbness 03/18/2016  . 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 Nuclear cataract of both eyes 05/24/2015  . Diabetes mellitus without complication ([place]) 16/11/9602  . 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 Eustachian tube dysfunction 02/12/2015  . Acute maxillary sinusitis 02/12/2015  . Wellness examination 08/22/2014  . Alkaline phosphatase elevation 08/22/2014  . 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 Benign non-nodular prostatic hyperplasia with lower urinary tract symptoms 01/31/2014  . Lesion of eyelid 09/26/2013  . Screening for prostate cancer 04/24/2013  . Diabetes ([place]) 06/16/2012  . ED (erectile dysfunction) of organic origin 06/03/2012  . Routine general medical examination at a health care facility 04/10/2012  . BPH (benign prostatic hyperplasia) 02/03/2010  . HEMOCCULT POSITIVE STOOL 05/25/2008  . ELBOW PAIN, RIGHT 07/04/2007  . Dyslipidemia 02/16/2007  . ANXIETY 02/16/2007  . ERECTILE DYSFUNCTION 02/16/2007  . Essential hypertension 02/16/2007  . TESTICULAR MASS, LEFT 02/16/2007  . KNEE PAIN, CHRONIC 02/16/2007  . Michell Heinrich OF 02/16/2007      Laureen Abrahams, PT, DPT 08/11/18 4:25 PM     [place], [place],  [address] Phone: [phone]   Fax:  [phone]  Name: Kenneth Pace MRN: [id number] Date of Birth: February 20, 1950

## 2018-08-16 ENCOUNTER — Other Ambulatory Visit: Payer: Self-pay

## 2018-08-16 ENCOUNTER — Ambulatory Visit (INDEPENDENT_AMBULATORY_CARE_PROVIDER_SITE_OTHER): Payer: BC Managed Care – PPO | Admitting: Physical Therapy

## 2018-08-16 ENCOUNTER — Encounter: Payer: Self-pay | Admitting: Physical Therapy

## 2018-08-16 DIAGNOSIS — M6281 Muscle weakness (generalized): Secondary | ICD-10-CM | POA: Diagnosis not present

## 2018-08-16 DIAGNOSIS — R2689 Other abnormalities of gait and mobility: Secondary | ICD-10-CM | POA: Diagnosis not present

## 2018-08-16 DIAGNOSIS — R2681 Unsteadiness on feet: Secondary | ICD-10-CM | POA: Diagnosis not present

## 2018-08-16 NOTE — Therapy (Signed)
[place], [place], [address] Phone: [phone]   Fax:  [phone]  Physical Therapy Treatment  Patient Details  Name: Kenneth Pace MRN: [id number] Date of Birth: 1950-06-10 Referring Provider (PT): Narda Amber, DO   Encounter Date: 08/16/2018  PT End of Session - 08/16/18 0801    Visit Number  6    Number of Visits  12    Date for PT Re-Evaluation  09/08/18    PT Start Time  0801    PT Stop Time  7096    PT Time Calculation (min)  41 min    Activity Tolerance  Patient tolerated treatment well;No increased pain    Behavior During Therapy  WFL for tasks assessed/performed       Past Medical History:  Diagnosis Date  . ADENOIDECTOMY, HX OF 02/16/2007  . Anxiety    pt. states he does not have anxiety.  Marland Kitchen BENIGN PROSTATIC HYPERTROPHY, WITH OBSTRUCTION 02/03/2010  . DIABETES MELLITUS, TYPE I, UNCONTROLLED 12/29/2006  . ERECTILE DYSFUNCTION 02/16/2007  . HYPERLIPIDEMIA 02/16/2007  . HYPERTENSION 02/16/2007  . Sleep apnea    uses CPAP  . TESTICULAR MASS, LEFT 02/16/2007    Past Surgical History:  Procedure Laterality Date  . ANTERIOR CERVICAL DECOMP/DISCECTOMY FUSION N/A 02/01/2017   Procedure: ANTERIOR CERVICAL DECOMPRESSION/DISCECTOMY FUSION CERVICAL THREE-FOUR , CERVICAL FOUR-FIVE  CERVICAL FIVE-SIX;  Surgeon: Consuella Lose, MD;  Location: [facility];  Service: Neurosurgery;  Laterality: N/A;  . COLONOSCOPY  02/05/2010   [facility]  . KNEE SURGERY     2 Lknee arthroscopy, 3 R knee arthroscpoy  . KNEE SURGERY Right 11/12/2016  . TONSILLECTOMY      There were no vitals filed for this visit.  Subjective Assessment - 08/16/18 0801    Subjective  Pt reports he did a lot of yard work on Saturday.  He states he is sore in his LE still.  He feels his balance has gotten better.    Limitations  Walking;Standing    Patient Stated Goals  learn exercises, gain strength    Currently in  Pain?  Yes    Pain Score  2     Pain Location  Leg    Pain Orientation  Right;Left    Pain Descriptors / Indicators  Sore    Aggravating Factors   over doing it    Pain Relieving Factors  time         [facility] PT Assessment - 08/16/18 0001      Assessment   Medical Diagnosis  CIDP; spastic gait    Referring Provider (PT)  Narda Amber, DO    Onset Date/Surgical Date  12/29/16    Hand Dominance  Right    Next MD Visit  02/02/2019    Prior Therapy  CIR/OPPT at [facility]      Precautions   Precautions  Fall      Berg Balance Test   Sit to Stand  Able to stand without using hands and stabilize independently    Standing Unsupported  Able to stand safely 2 minutes    Sitting with Back Unsupported but Feet Supported on Floor or Stool  Able to sit safely and securely 2 minutes    Stand to Sit  Sits safely with minimal use of hands    Transfers  Able to transfer safely, definite need of hands    Standing Unsupported with Eyes Closed  Able to stand 10 seconds safely    Standing Unsupported  with Feet Together  Able to place feet together independently and stand 1 minute safely    From Standing, Reach Forward with Outstretched Arm  Can reach confidently >25 cm (10")    From Standing Position, Pick up Object from [place] to pick up shoe safely and easily    From Standing Position, Turn to Look Behind Over each Shoulder  Looks behind from both sides and weight shifts well    Turn 360 Degrees  Able to turn 360 degrees safely but slowly    Standing Unsupported, Alternately Place Feet on Step/Stool  Able to stand independently and safely and complete 8 steps in 20 seconds    Standing Unsupported, One Foot in Front  Able to plae foot ahead of the other independently and hold 30 seconds    Standing on One Leg  Tries to lift leg/unable to hold 3 seconds but remains standing independently    Total Score  49                   [facility] Adult PT Treatment/Exercise - 08/16/18 0001       Lumbar Exercises: Stretches   Passive Hamstring Stretch  Right;Left;30 seconds;2 reps   supine with strap   Lower Trunk Rotation  10 seconds;3 reps   arms in Y   Piriformis Stretch  Right;Left;30 seconds;2 reps   supine     Lumbar Exercises: Aerobic   Nustep  L5 x 6.5 min; 4 extremities          Balance Exercises - 08/16/18 0840      Balance Exercises: Standing   Tandem Stance  Eyes open;Intermittent upper extremity support;1 rep;30 secs    SLS  Eyes open;2 reps   up to 3 sec on RLE, up to 8 sec on LLE   Sidestepping  2 reps   10 ft at counter R/L    Other Standing Exercises  braiding 12' x 2 reps each direction, intermittent UE support; toe taps to 4" step x 5 reps each leg with intermittent support; SLS with toe taps front, side, back x 5 reps each side with intermittent support        PT Education - 08/16/18 0837    Education Details  HEP    Person(s) Educated  Patient    Methods  Explanation;Demonstration;Verbal cues;Handout    Comprehension  Returned demonstration;Verbalized understanding          PT Long Term Goals - 07/28/18 1619      PT LONG TERM GOAL #1   Title  independent with HEP    Status  New    Target Date  09/08/18      PT LONG TERM GOAL #2   Title  improve BERG balance score to >/= 51/56 for improved balance    Status  New    Target Date  09/08/18      PT LONG TERM GOAL #3   Title  improve timed up and go to < 13 sec for improved mobility and decreased fall risk    Status  New    Target Date  09/08/18      PT LONG TERM GOAL #4   Title  amb > 300' modified independent without LOB for improved mobility and safety    Status  New    Target Date  09/08/18            Plan - 08/16/18 0830    Clinical Impression Statement  Pt continues to require  intermittent UE support and tactile cues with standing dynamic activities.  His BERG score improved to 49 points.  Pt progressing well towards goals.    Personal Factors and Comorbidities  Time  since onset of injury/illness/exacerbation;Comorbidity 2    Comorbidities  ACDF, CIDP    Examination-Activity Limitations  Locomotion Level;Transfers;Bed Mobility;Stand    Examination-Participation Restrictions  Other   work   Stability/Clinical Decision Making  Evolving/Moderate complexity    Rehab Potential  Good    PT Frequency  2x / week    PT Duration  6 weeks    PT Treatment/Interventions  ADLs/Self Care Home Management;Cryotherapy;Electrical Stimulation;Ultrasound;Moist Heat;Gait training;Stair training;Functional mobility training;Therapeutic activities;Therapeutic exercise;Balance training;Patient/family education;Neuromuscular re-education;Manual techniques;Taping;Vestibular    PT Next Visit Plan  continue adding balance exercises to HEP    Consulted and Agree with Plan of Care  Patient       Patient will benefit from skilled therapeutic intervention in order to improve the following deficits and impairments:  Abnormal gait, Difficulty walking, Impaired tone, Increased muscle spasms, Decreased activity tolerance, Pain, Impaired flexibility, Decreased balance, Decreased mobility, Decreased strength(will monitor pain but will not directly address)  Visit Diagnosis: 1. Unsteadiness on feet   2. Muscle weakness (generalized)   3. Other abnormalities of gait and mobility        Problem List Patient Active Problem List   Diagnosis Date Noted  . Chronic bilateral low back pain without sciatica 07/21/2018  . 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 CIDP (chronic inflammatory demyelinating polyneuropathy) ([place]) 12/30/2017  . Edema 03/01/2017  . Hypokalemia   . Slow transit constipation   . Steroid-induced hyperglycemia   . Urinary retention   . Accidental drug overdose   . Postoperative pain   . Hyponatremia   . Cervical myelopathy ([place]) 02/01/2017  . Shortness of breath at rest   . Hypoglycemia   . Spondylosis, cervical, with myelopathy   . Neuropathic pain   . Benign  essential HTN   . Labile blood glucose   . Muscle spasm   . Generalized anxiety disorder   . Acute inflammatory demyelinating polyneuropathy ([place]) 01/11/2017  . Anxiety state   . Neurogenic bladder   . Depression 12/30/2016  . Leg weakness, bilateral 12/30/2016  . 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 GBS (Guillain Barre syndrome) ([place])   . AIDP (acute inflammatory demyelinating polyneuropathy) ([place]) 11/27/2016  . Weakness 11/20/2016  . Weakness of both arms 10/30/2016  . 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 Obstructive sleep apnea 03/24/2016  . Numbness 03/18/2016  . Mild nonproliferative diabetic retinopathy of both eyes without macular edema associated with type 2 diabetes mellitus ([place]) 05/24/2015  . Nuclear cataract of both eyes 05/24/2015  . Diabetes mellitus without complication ([place]) 73/22/0254  . 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 Eustachian tube dysfunction 02/12/2015  . Acute maxillary sinusitis 02/12/2015  . Wellness examination 08/22/2014  . Alkaline phosphatase elevation 08/22/2014  . 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 Benign non-nodular prostatic hyperplasia with lower urinary tract symptoms 01/31/2014  . Lesion of eyelid 09/26/2013  . Screening for prostate cancer 04/24/2013  . Diabetes ([place]) 06/16/2012  . ED (erectile dysfunction) of organic origin 06/03/2012  . Routine general medical examination at a health care facility 04/10/2012  . BPH (benign prostatic hyperplasia) 02/03/2010  . HEMOCCULT POSITIVE STOOL 05/25/2008  . ELBOW PAIN, RIGHT 07/04/2007  . Dyslipidemia 02/16/2007  . ANXIETY 02/16/2007  . ERECTILE DYSFUNCTION 02/16/2007  . Essential hypertension 02/16/2007  .  TESTICULAR MASS, LEFT 02/16/2007  . KNEE PAIN, CHRONIC 02/16/2007  . Michell Heinrich OF 02/16/2007   Kerin Perna, PTA 08/16/18 8:58 AM  [facility] [place], [place], [address] Phone: [phone]   Fax:  [phone]  Name: Kenneth Pace MRN: [id number] Date of Birth: [birth year]

## 2018-08-16 NOTE — Patient Instructions (Addendum)
Balance: Three-Way Leg Swing    Stand on left foot, hands on hips. Reach other foot forward __5__ times, sideways __5__ times, back ___5_ times. Hold each position __1__ seconds. Relax. Do this at the sink.

## 2018-08-18 ENCOUNTER — Other Ambulatory Visit: Payer: Self-pay

## 2018-08-18 ENCOUNTER — Ambulatory Visit (INDEPENDENT_AMBULATORY_CARE_PROVIDER_SITE_OTHER): Payer: BC Managed Care – PPO | Admitting: Physical Therapy

## 2018-08-18 DIAGNOSIS — M6281 Muscle weakness (generalized): Secondary | ICD-10-CM

## 2018-08-18 DIAGNOSIS — R2689 Other abnormalities of gait and mobility: Secondary | ICD-10-CM | POA: Diagnosis not present

## 2018-08-18 DIAGNOSIS — R2681 Unsteadiness on feet: Secondary | ICD-10-CM

## 2018-08-18 NOTE — Therapy (Signed)
[place], [place], [address] Phone: [phone]   Fax:  [phone]  Physical Therapy Treatment  Patient Details  Name: Kenneth Pace MRN: [id number] Date of Birth: 10/20/1950 Referring Provider (PT): Narda Amber, DO   Encounter Date: 08/18/2018  PT End of Session - 08/18/18 1713    Visit Number  7    Number of Visits  12    Date for PT Re-Evaluation  09/08/18    PT Start Time  4270    PT Stop Time  1740    PT Time Calculation (min)  46 min    Activity Tolerance  Patient tolerated treatment well    Behavior During Therapy  [facility] for tasks assessed/performed       Past Medical History:  Diagnosis Date  . ADENOIDECTOMY, HX OF 02/16/2007  . Anxiety    pt. states he does not have anxiety.  Marland Kitchen BENIGN PROSTATIC HYPERTROPHY, WITH OBSTRUCTION 02/03/2010  . DIABETES MELLITUS, TYPE I, UNCONTROLLED 12/29/2006  . ERECTILE DYSFUNCTION 02/16/2007  . HYPERLIPIDEMIA 02/16/2007  . HYPERTENSION 02/16/2007  . Sleep apnea    uses CPAP  . TESTICULAR MASS, LEFT 02/16/2007    Past Surgical History:  Procedure Laterality Date  . ANTERIOR CERVICAL DECOMP/DISCECTOMY FUSION N/A 02/01/2017   Procedure: ANTERIOR CERVICAL DECOMPRESSION/DISCECTOMY FUSION CERVICAL THREE-FOUR , CERVICAL FOUR-FIVE  CERVICAL FIVE-SIX;  Surgeon: Consuella Lose, MD;  Location: [facility];  Service: Neurosurgery;  Laterality: N/A;  . COLONOSCOPY  02/05/2010   [facility]  . KNEE SURGERY     2 Lknee arthroscopy, 3 R knee arthroscpoy  . KNEE SURGERY Right 11/12/2016  . TONSILLECTOMY      There were no vitals filed for this visit.  Subjective Assessment - 08/18/18 1709    Subjective  Pt presents with his wife.  "She'd like to see what I'm doing here".  Pt reports no new changes since last visit.    Currently in Pain?  Yes    Pain Score  2     Pain Location  Back    Pain Orientation  Right;Left;Lower    Aggravating Factors   over  doing it    Pain Relieving Factors  time         [facility] PT Assessment - 08/18/18 0001      Assessment   Medical Diagnosis  CIDP; spastic gait    Referring Provider (PT)  Narda Amber, DO    Onset Date/Surgical Date  12/29/16    Hand Dominance  Right    Next MD Visit  02/02/2019    Prior Therapy  CIR/OPPT at [facility] Adult PT Treatment/Exercise - 08/18/18 0001      Lumbar Exercises: Stretches   Passive Hamstring Stretch  Right;Left;30 seconds;2 reps   supine with strap   Piriformis Stretch  Right;Left;30 seconds;2 reps   supine   Gastroc Stretch  Right;Left;2 reps;30 seconds   incline board   Other Lumbar Stretch Exercise  lower back stretch in sitting (leaning over, hanging over lap) x 20 sec x 2 reps       Lumbar Exercises: Aerobic   Nustep  L5: 5.5 min       Lumbar Exercises: Seated   Sit to Stand  10 reps   low black mat; no UE support    Sit to Stand Limitations  VC to bend at knees earlier when descending          Balance Exercises -  08/18/18 1729      Balance Exercises: Standing   SLS  Eyes open;2 reps   up to 19 sec on RLE, up to 29 sec on LLE   Tandem Gait  Forward;Retro;Intermittent upper extremity support;2 reps   10 ft   Partial Tandem Stance  Eyes open;Foam/compliant surface;20 secs;2 reps;Limitations   tugging on leash (simulate dog); minA to steady    Sidestepping  2 reps   10 ft at counter R/L    Marching Limitations  VC for increased Rt hip flexion x 10 ft x 4 reps, intermittent UE support to steady     Other Standing Exercises  braiding 41ft x 2 reps with intermittent support;              PT Long Term Goals - 07/28/18 1619      PT LONG TERM GOAL #1   Title  independent with HEP    Status  New    Target Date  09/08/18      PT LONG TERM GOAL #2   Title  improve BERG balance score to >/= 51/56 for improved balance    Status  New    Target Date  09/08/18      PT LONG TERM GOAL #3   Title  improve timed up and go to <  13 sec for improved mobility and decreased fall risk    Status  New    Target Date  09/08/18      PT LONG TERM GOAL #4   Title  amb > 300' modified independent without LOB for improved mobility and safety    Status  New    Target Date  09/08/18            Plan - 08/18/18 1754    Clinical Impression Statement  continued improvement in standing dynamic balance and quality of gait with freq cues.  Pt continues to require intermittent UE support on counter and tactile cues, occasional min A to steady with challenging balance activities.  Progressing towards goals.    Personal Factors and Comorbidities  Time since onset of injury/illness/exacerbation;Comorbidity 2    Examination-Activity Limitations  Locomotion Level;Transfers;Bed Mobility;Stand    Rehab Potential  Good    PT Frequency  2x / week    PT Duration  6 weeks    PT Treatment/Interventions  ADLs/Self Care Home Management;Cryotherapy;Electrical Stimulation;Ultrasound;Moist Heat;Gait training;Stair training;Functional mobility training;Therapeutic activities;Therapeutic exercise;Balance training;Patient/family education;Neuromuscular re-education;Manual techniques;Taping;Vestibular    PT Next Visit Plan  continue adding balance/core exercises to HEP    Consulted and Agree with Plan of Care  Patient;Family member/caregiver       Patient will benefit from skilled therapeutic intervention in order to improve the following deficits and impairments:  Abnormal gait, Difficulty walking, Impaired tone, Increased muscle spasms, Decreased activity tolerance, Pain, Impaired flexibility, Decreased balance, Decreased mobility, Decreased strength  Visit Diagnosis: 1. Unsteadiness on feet   2. Muscle weakness (generalized)   3. Other abnormalities of gait and mobility        Problem List Patient Active Problem List   Diagnosis Date Noted  . Chronic bilateral low back pain without sciatica 07/21/2018  . Neurologic gait disorder  02/10/2018  . 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 Edema 03/01/2017  . Hypokalemia   . Slow transit constipation   . Steroid-induced hyperglycemia   . Urinary retention   . Accidental drug overdose   . Postoperative pain   . Hyponatremia   . Cervical myelopathy ([place])  02/01/2017  . Shortness of breath at rest   . Hypoglycemia   . Spondylosis, cervical, with myelopathy   . Neuropathic pain   . Benign essential HTN   . Labile blood glucose   . Muscle spasm   . Generalized anxiety disorder   . Acute inflammatory demyelinating polyneuropathy ([place]) 01/11/2017  . Anxiety state   . Neurogenic bladder   . Depression 12/30/2016  . Leg weakness, bilateral 12/30/2016  . 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 GBS (Guillain Barre syndrome) ([place])   . AIDP (acute inflammatory demyelinating polyneuropathy) ([place]) 11/27/2016  . Weakness 11/20/2016  . Weakness of both arms 10/30/2016  . 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 Obstructive sleep apnea 03/24/2016  . Numbness 03/18/2016  . Mild nonproliferative diabetic retinopathy of both eyes without macular edema associated with type 2 diabetes mellitus ([place]) 05/24/2015  . Nuclear cataract of both eyes 05/24/2015  . Diabetes mellitus without complication ([place]) 49/82/6415  . 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 Eustachian tube dysfunction 02/12/2015  . Acute maxillary sinusitis 02/12/2015  . Wellness examination 08/22/2014  . Alkaline phosphatase elevation 08/22/2014  . 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 Benign non-nodular prostatic hyperplasia with lower urinary tract symptoms 01/31/2014  . Lesion of eyelid 09/26/2013  . Screening for prostate cancer 04/24/2013  . Diabetes ([place]) 06/16/2012  . ED (erectile dysfunction) of organic origin 06/03/2012  . Routine general medical examination at a health care facility 04/10/2012  . BPH  (benign prostatic hyperplasia) 02/03/2010  . HEMOCCULT POSITIVE STOOL 05/25/2008  . ELBOW PAIN, RIGHT 07/04/2007  . Dyslipidemia 02/16/2007  . ANXIETY 02/16/2007  . ERECTILE DYSFUNCTION 02/16/2007  . Essential hypertension 02/16/2007  . TESTICULAR MASS, LEFT 02/16/2007  . KNEE PAIN, CHRONIC 02/16/2007  . Michell Heinrich OF 02/16/2007   Kerin Perna, PTA 08/18/18 5:57 PM  [place], [place], [address] Phone: [phone]   Fax:  [phone]  Name: Kenneth Pace MRN: [id number] Date of Birth: August 23, 1950

## 2018-08-23 ENCOUNTER — Other Ambulatory Visit: Payer: Self-pay

## 2018-08-23 ENCOUNTER — Encounter: Payer: Self-pay | Admitting: Physical Therapy

## 2018-08-23 ENCOUNTER — Ambulatory Visit (INDEPENDENT_AMBULATORY_CARE_PROVIDER_SITE_OTHER): Payer: BC Managed Care – PPO | Admitting: Physical Therapy

## 2018-08-23 DIAGNOSIS — R2689 Other abnormalities of gait and mobility: Secondary | ICD-10-CM | POA: Diagnosis not present

## 2018-08-23 DIAGNOSIS — H2513 Age-related nuclear cataract, bilateral: Secondary | ICD-10-CM | POA: Diagnosis not present

## 2018-08-23 DIAGNOSIS — H04123 Dry eye syndrome of bilateral lacrimal glands: Secondary | ICD-10-CM | POA: Diagnosis not present

## 2018-08-23 DIAGNOSIS — R2681 Unsteadiness on feet: Secondary | ICD-10-CM | POA: Diagnosis not present

## 2018-08-23 DIAGNOSIS — H47323 Drusen of optic disc, bilateral: Secondary | ICD-10-CM | POA: Diagnosis not present

## 2018-08-23 DIAGNOSIS — E119 Type 2 diabetes mellitus without complications: Secondary | ICD-10-CM | POA: Diagnosis not present

## 2018-08-23 DIAGNOSIS — M6281 Muscle weakness (generalized): Secondary | ICD-10-CM | POA: Diagnosis not present

## 2018-08-23 LAB — HM DIABETES EYE EXAM

## 2018-08-23 NOTE — Therapy (Signed)
Kenneth Pace, [place], [address] Phone: [phone]   Fax:  [phone]  Physical Therapy Treatment  Patient Details  Name: Kenneth Pace MRN: [id number] Date of Birth: 1950/07/24 Referring Provider (PT): Narda Amber, DO   Encounter Date: 08/23/2018  PT End of Session - 08/23/18 0816    Visit Number  8    Number of Visits  12    Date for PT Re-Evaluation  09/08/18    PT Start Time  0734    PT Stop Time  0813    PT Time Calculation (min)  39 min    Activity Tolerance  Patient tolerated treatment well    Behavior During Therapy  [facility] for tasks assessed/performed       Past Medical History:  Diagnosis Date  . ADENOIDECTOMY, HX OF 02/16/2007  . Anxiety    pt. states he does not have anxiety.  Marland Kitchen BENIGN PROSTATIC HYPERTROPHY, WITH OBSTRUCTION 02/03/2010  . DIABETES MELLITUS, TYPE I, UNCONTROLLED 12/29/2006  . ERECTILE DYSFUNCTION 02/16/2007  . HYPERLIPIDEMIA 02/16/2007  . HYPERTENSION 02/16/2007  . Sleep apnea    uses CPAP  . TESTICULAR MASS, LEFT 02/16/2007    Past Surgical History:  Procedure Laterality Date  . ANTERIOR CERVICAL DECOMP/DISCECTOMY FUSION N/A 02/01/2017   Procedure: ANTERIOR CERVICAL DECOMPRESSION/DISCECTOMY FUSION CERVICAL THREE-FOUR , CERVICAL FOUR-FIVE  CERVICAL FIVE-SIX;  Surgeon: Consuella Lose, MD;  Location: [place];  Service: Neurosurgery;  Laterality: N/A;  . COLONOSCOPY  02/05/2010   [facility]  . KNEE SURGERY     2 Lknee arthroscopy, 3 R knee arthroscpoy  . KNEE SURGERY Right 11/12/2016  . TONSILLECTOMY      There were no vitals filed for this visit.  Subjective Assessment - 08/23/18 0738    Subjective  doing well.  feels like he's a little stiff but hasn't done his morning stretches today    Patient Stated Goals  learn exercises, gain strength    Currently in Pain?  No/denies                       [facility] Adult PT Treatment/Exercise -  08/23/18 0739      Lumbar Exercises: Stretches   Lower Trunk Rotation  10 seconds   10 reps; green physioball   Piriformis Stretch  Right;Left;30 seconds;2 reps   supine   Other Lumbar Stretch Exercise  seated lateral overhead reach 3x15 sec bil      Lumbar Exercises: Aerobic   Nustep  L5 x 6 min      Lumbar Exercises: Standing   Other Standing Lumbar Exercises  SLS alternating: taps to 6" steps x 10 each      Lumbar Exercises: Seated   Long Arc Quad on [place]  Both;10 reps    Hip Flexion on [place]  Both;10 reps    Other Seated Lumbar Exercises  pelvic tilts A/P on red physioball x15 reps      Lumbar Exercises: Supine   Bridge  10 reps;5 seconds   on green physioball                 PT Long Term Goals - 07/28/18 1619      PT LONG TERM GOAL #1   Title  independent with HEP    Status  New    Target Date  09/08/18      PT LONG TERM GOAL #2   Title  improve BERG balance score to >/= 51/56 for improved  balance    Status  New    Target Date  09/08/18      PT LONG TERM GOAL #3   Title  improve timed up and go to < 13 sec for improved mobility and decreased fall risk    Status  New    Target Date  09/08/18      PT LONG TERM GOAL #4   Title  amb > 300' modified independent without LOB for improved mobility and safety    Status  New    Target Date  09/08/18            Plan - 08/23/18 0816    Clinical Impression Statement  Pt tolerated core exercises well today with min A needed for balance.  Overall progressing well with PT.  Will plan to begin checking goals next week.    Personal Factors and Comorbidities  Time since onset of injury/illness/exacerbation;Comorbidity 2    Examination-Activity Limitations  Locomotion Level;Transfers;Bed Mobility;Stand    Rehab Potential  Good    PT Frequency  2x / week    PT Duration  6 weeks    PT Treatment/Interventions  ADLs/Self Care Home Management;Cryotherapy;Electrical Stimulation;Ultrasound;Moist Heat;Gait  training;Stair training;Functional mobility training;Therapeutic activities;Therapeutic exercise;Balance training;Patient/family education;Neuromuscular re-education;Manual techniques;Taping;Vestibular    PT Next Visit Plan  continue adding balance/core exercises to HEP    Consulted and Agree with Plan of Care  Patient;Family member/caregiver       Patient will benefit from skilled therapeutic intervention in order to improve the following deficits and impairments:  Abnormal gait, Difficulty walking, Impaired tone, Increased muscle spasms, Decreased activity tolerance, Pain, Impaired flexibility, Decreased balance, Decreased mobility, Decreased strength  Visit Diagnosis: 1. Unsteadiness on feet   2. Muscle weakness (generalized)   3. Other abnormalities of gait and mobility        Problem List Patient Active Problem List   Diagnosis Date Noted  . Chronic bilateral low back pain without sciatica 07/21/2018  . 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 CIDP (chronic inflammatory demyelinating polyneuropathy) ([place]) 12/30/2017  . Edema 03/01/2017  . Hypokalemia   . Slow transit constipation   . Steroid-induced hyperglycemia   . Urinary retention   . Accidental drug overdose   . Postoperative pain   . Hyponatremia   . Cervical myelopathy ([place]) 02/01/2017  . Shortness of breath at rest   . Hypoglycemia   . Spondylosis, cervical, with myelopathy   . Neuropathic pain   . Benign essential HTN   . Labile blood glucose   . Muscle spasm   . Generalized anxiety disorder   . Acute inflammatory demyelinating polyneuropathy ([place]) 01/11/2017  . Anxiety state   . Neurogenic bladder   . Depression 12/30/2016  . Leg weakness, bilateral 12/30/2016  . 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 GBS (Guillain Barre syndrome) ([place])   . AIDP (acute inflammatory demyelinating polyneuropathy) ([place]) 11/27/2016  . Weakness 11/20/2016  . Weakness of both arms  10/30/2016  . 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 Obstructive sleep apnea 03/24/2016  . Numbness 03/18/2016  . Mild nonproliferative diabetic retinopathy of both eyes without macular edema associated with type 2 diabetes mellitus ([place]) 05/24/2015  . Nuclear cataract of both eyes 05/24/2015  . Diabetes mellitus without complication ([place]) 76/28/3151  . 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 Eustachian tube dysfunction 02/12/2015  . Acute maxillary sinusitis 02/12/2015  . Wellness examination 08/22/2014  . Alkaline phosphatase elevation 08/22/2014  . 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  Benign non-nodular prostatic hyperplasia with lower urinary tract symptoms 01/31/2014  . Lesion of eyelid 09/26/2013  . Screening for prostate cancer 04/24/2013  . Diabetes ([place]) 06/16/2012  . ED (erectile dysfunction) of organic origin 06/03/2012  . Routine general medical examination at a health care facility 04/10/2012  . BPH (benign prostatic hyperplasia) 02/03/2010  . HEMOCCULT POSITIVE STOOL 05/25/2008  . ELBOW PAIN, RIGHT 07/04/2007  . Dyslipidemia 02/16/2007  . ANXIETY 02/16/2007  . ERECTILE DYSFUNCTION 02/16/2007  . Essential hypertension 02/16/2007  . TESTICULAR MASS, LEFT 02/16/2007  . KNEE PAIN, CHRONIC 02/16/2007  . Michell Heinrich OF 02/16/2007      Laureen Abrahams, PT, DPT 08/23/18 8:18 AM     [facility] [place], [place], [address] Phone: [phone]   Fax:  [phone]  Name: COBEN GODSHALL MRN: [id number] Date of Birth: 02/19/1950

## 2018-08-25 ENCOUNTER — Encounter: Payer: BC Managed Care – PPO | Admitting: Physical Therapy

## 2018-08-26 ENCOUNTER — Ambulatory Visit (INDEPENDENT_AMBULATORY_CARE_PROVIDER_SITE_OTHER): Payer: BC Managed Care – PPO | Admitting: Family Medicine

## 2018-08-26 ENCOUNTER — Encounter: Payer: Self-pay | Admitting: Family Medicine

## 2018-08-26 ENCOUNTER — Other Ambulatory Visit: Payer: Self-pay

## 2018-08-26 VITALS — BP 130/78 | HR 67 | Temp 98.0°F | Ht 72.0 in | Wt 238.6 lb

## 2018-08-26 DIAGNOSIS — I10 Essential (primary) hypertension: Secondary | ICD-10-CM

## 2018-08-26 DIAGNOSIS — F411 Generalized anxiety disorder: Secondary | ICD-10-CM

## 2018-08-26 MED ORDER — LISINOPRIL-HYDROCHLOROTHIAZIDE 10-12.5 MG PO TABS: ORAL_TABLET | ORAL | 2 refills | Status: DC

## 2018-08-26 MED ORDER — BUSPIRONE HCL 30 MG PO TABS: 30.0000 mg | ORAL_TABLET | Freq: Every day | ORAL | 2 refills | Status: DC

## 2018-08-26 NOTE — Progress Notes (Signed)
Chief Complaint  Patient presents with  . New Patient (Initial Visit)    transfer of Care from Dr. Loanne Drilling       New Patient Visit SUBJECTIVE: HPI: Kenneth Pace is an 68 y.o.male who is being seen for establishing care.  The patient was previously seen at his endocrinologist's office.  Hypertension Patient presents for hypertension follow up. He does monitor home blood pressures. Blood pressures ranging on average from 120's/70's. He is compliant with medications- Prinzide 10-12.5 mg/d. Patient has these side effects of medication: none He is adhering to a healthy diet overall. Exercise: walking  GAD +hx of GAD. Stressful environment at work. Was taking BuSpar 30 mg/d and it was working well. Has been off of it for 3 weeks and not noticed a difference. Has been following with [facility], placed on SNRI and TCA by them.   No Known Allergies  Past Medical History:  Diagnosis Date  . ADENOIDECTOMY, HX OF 02/16/2007  . Anxiety    pt. states he does not have anxiety.  Marland Kitchen BENIGN PROSTATIC HYPERTROPHY, WITH OBSTRUCTION 02/03/2010  . DIABETES MELLITUS, TYPE I, UNCONTROLLED 12/29/2006  . ERECTILE DYSFUNCTION 02/16/2007  . HYPERLIPIDEMIA 02/16/2007  . HYPERTENSION 02/16/2007  . Sleep apnea    uses CPAP  . TESTICULAR MASS, LEFT 02/16/2007   Past Surgical History:  Procedure Laterality Date  . ANTERIOR CERVICAL DECOMP/DISCECTOMY FUSION N/A 02/01/2017   Procedure: ANTERIOR CERVICAL DECOMPRESSION/DISCECTOMY FUSION CERVICAL THREE-FOUR , CERVICAL FOUR-FIVE  CERVICAL FIVE-SIX;  Surgeon: Consuella Lose, MD;  Location: [place];  Service: Neurosurgery;  Laterality: N/A;  . COLONOSCOPY  02/05/2010   [facility]  . KNEE SURGERY     2 Lknee arthroscopy, 3 R knee arthroscpoy  . KNEE SURGERY Right 11/12/2016  . TONSILLECTOMY     Family History  Problem Relation Age of Onset  . Dementia Mother   . Diabetes Mellitus I Mother   . Hypertension Father        62  . Healthy Sister    . Rheum arthritis Brother   . Cancer Neg Hx   . Colon cancer Neg Hx   . Esophageal cancer Neg Hx   . Stomach cancer Neg Hx   . Rectal cancer Neg Hx   . Colon polyps Neg Hx    No Known Allergies  Current Outpatient Medications:  .  acetaminophen (TYLENOL) 325 MG tablet, Take 2 tablets (650 mg total) by mouth every 6 (six) hours as needed for mild pain or headache., Disp: , Rfl:  .  amitriptyline (ELAVIL) 75 MG tablet, TAKE 1 TABLET(75 MG) BY MOUTH AT BEDTIME, Disp: 30 tablet, Rfl: 2 .  B Complex Vitamins (B-COMPLEX/B-12 PO), Take by mouth., Disp: , Rfl:  .  baclofen (LIORESAL) 10 MG tablet, Take 2 tablets (20 mg total) by mouth 3 (three) times daily., Disp: 360 each, Rfl: 3 .  celecoxib (CELEBREX) 100 MG capsule, TAKE 1 CAPSULE(100 MG) BY MOUTH AT BEDTIME, Disp: 30 capsule, Rfl: 2 .  Coenzyme Q10 (COQ10) 200 MG CAPS, Take by mouth., Disp: , Rfl:  .  Cranberry 400 MG TABS, Take 2 each by mouth daily.  , Disp: , Rfl:  .  Cyanocobalamin (VITAMIN B-12 CR PO), Take 1 each by mouth daily.  , Disp: , Rfl:  .  DULoxetine (CYMBALTA) 60 MG capsule, TAKE 1 CAPSULE DAILY, Disp: 90 capsule, Rfl: 4 .  finasteride (PROSCAR) 5 MG tablet, Take 5 mg by mouth daily. , Disp: , Rfl:  .  glucagon 1  MG injection, Inject 1 mg into the vein once as needed., Disp: 1 each, Rfl: 12 .  glucose blood (BAYER CONTOUR NEXT TEST) test strip, MEDICALLY NECESSARY FOR USE WITH PUMP; Use to check blood sugar 5 times per day and prn; E11.42, Disp: 500 each, Rfl: 3 .  HUMALOG 100 UNIT/ML injection, INJECT 120 UNITS DAILY IN PUMP, Disp: 100 mL, Rfl: 4 .  Insulin Infusion Pump Supplies (PARADIGM RESERVOIR 3ML) MISC, 1 Device by Does not apply route every 3 (three) days., Disp: 30 each, Rfl: 3 .  Insulin Infusion Pump Supplies (QUICK-SET INFUSION 43" 9MM) MISC, 1 Device by Does not apply route every 3 (three) days., Disp: 30 each, Rfl: 3 .  lisinopril-hydrochlorothiazide (ZESTORETIC) 10-12.5 MG tablet, TAKE 1 TABLET DAILY, PLEASE  SEE PRIMARY CARE PHYSICIAN FOR ANY FUTURE REFILLS, Disp: 90 tablet, Rfl: 2 .  Multiple Vitamin (MULTIVITAMIN) capsule, Take by mouth., Disp: , Rfl:  .  Multiple Vitamins-Minerals (MULTIVITAMIN,TX-MINERALS) tablet, Take 1 tablet by mouth daily.  , Disp: , Rfl:  .  scopolamine (TRANSDERM-SCOP) 1 MG/3DAYS, Place 1 patch (1.5 mg total) onto the skin every 3 (three) days., Disp: 10 patch, Rfl: 12  ROS Cardiovascular: Denies chest pain  Respiratory: Denies dyspnea   OBJECTIVE: BP 130/78 (BP Location: Left Arm, Patient Position: Sitting, Cuff Size: Large)   Pulse 67   Temp 98 F (36.7 C) (Oral)   Ht 6' (1.829 m)   Wt 238 lb 9 oz (108.2 kg)   SpO2 96%   BMI 32.35 kg/m   Constitutional: -  VS reviewed -  Well developed, well nourished, appears stated age -  No apparent distress  Psychiatric: -  Oriented to person, place, and time -  Memory intact -  Affect and mood normal -  Fluent conversation, good eye contact -  Judgment and insight age appropriate  Eye: -  Conjunctivae clear, no discharge -  Pupils symmetric, round, reactive to light  ENMT: -  MMM    Pharynx moist, no exudate, no erythema  Neck: -  No gross swelling, no palpable masses -  Thyroid midline, not enlarged, mobile, no palpable masses  Cardiovascular: -  RRR -  No LE edema  Respiratory: -  Normal respiratory effort, no accessory muscle use, no retraction -  Breath sounds equal, no wheezes, no ronchi, no crackles  Musculoskeletal: -  No clubbing, no cyanosis -  Gait normal  Skin: -  No significant lesion on inspection -  Warm and dry to palpation   ASSESSMENT/PLAN: GAD (generalized anxiety disorder) - Plan: Stop BuSpar, monitor s/s's. He is on SNRI and TCA, that will probably suffice.  Essential hypertension - Plan: Cont Prinzide. OK to stop home BP checks. Counseled on diet and exercise.  Patient should return in 6 mo for CPE. The patient voiced understanding and agreement to the plan.   [facility], DO 08/26/18  12:34 PM

## 2018-08-26 NOTE — Patient Instructions (Signed)
Keep the diet clean and stay active.  Because your blood pressure is well-controlled, you no longer have to check your blood pressure at home anymore unless you wish. Some people check it twice daily every day and some people stop altogether. Either or anything in between is fine. Strong work!  Consider elevating your legs, minding salt intake, and compression stockings. Stay active.  Let us know if you need anything.

## 2018-08-29 ENCOUNTER — Other Ambulatory Visit: Payer: Self-pay

## 2018-08-30 ENCOUNTER — Ambulatory Visit (INDEPENDENT_AMBULATORY_CARE_PROVIDER_SITE_OTHER): Payer: BC Managed Care – PPO | Admitting: Physical Therapy

## 2018-08-30 DIAGNOSIS — R2681 Unsteadiness on feet: Secondary | ICD-10-CM

## 2018-08-30 DIAGNOSIS — R2689 Other abnormalities of gait and mobility: Secondary | ICD-10-CM | POA: Diagnosis not present

## 2018-08-30 DIAGNOSIS — M6281 Muscle weakness (generalized): Secondary | ICD-10-CM

## 2018-08-30 DIAGNOSIS — G610 Guillain-Barre syndrome: Secondary | ICD-10-CM

## 2018-08-30 NOTE — Therapy (Signed)
[place], [place], [address] Phone: [phone]   Fax:  [phone]  Physical Therapy Treatment  Patient Details  Name: Kenneth Pace MRN: [id number] Date of Birth: 10/16/1950 Referring Provider (PT): Narda Amber, DO   Encounter Date: 08/30/2018  PT End of Session - 08/30/18 0815    Visit Number  9    Number of Visits  12    Date for PT Re-Evaluation  09/08/18    PT Start Time  0804    PT Stop Time  0846    PT Time Calculation (min)  42 min    Activity Tolerance  Patient tolerated treatment well    Behavior During Therapy  [facility] for tasks assessed/performed       Past Medical History:  Diagnosis Date  . ADENOIDECTOMY, HX OF 02/16/2007  . Anxiety    pt. states he does not have anxiety.  Marland Kitchen BENIGN PROSTATIC HYPERTROPHY, WITH OBSTRUCTION 02/03/2010  . DIABETES MELLITUS, TYPE I, UNCONTROLLED 12/29/2006  . ERECTILE DYSFUNCTION 02/16/2007  . HYPERLIPIDEMIA 02/16/2007  . HYPERTENSION 02/16/2007  . Sleep apnea    uses CPAP  . TESTICULAR MASS, LEFT 02/16/2007    Past Surgical History:  Procedure Laterality Date  . ANTERIOR CERVICAL DECOMP/DISCECTOMY FUSION N/A 02/01/2017   Procedure: ANTERIOR CERVICAL DECOMPRESSION/DISCECTOMY FUSION CERVICAL THREE-FOUR , CERVICAL FOUR-FIVE  CERVICAL FIVE-SIX;  Surgeon: Consuella Lose, MD;  Location: [place];  Service: Neurosurgery;  Laterality: N/A;  . COLONOSCOPY  02/05/2010   [facility]  . KNEE SURGERY     2 Lknee arthroscopy, 3 R knee arthroscpoy  . KNEE SURGERY Right 11/12/2016  . TONSILLECTOMY      There were no vitals filed for this visit.  Subjective Assessment - 08/30/18 0816    Subjective  Pt reports continual stiffness, especially in mornings.  He has brought his cane today so he can show how he uses it.    Currently in Pain?  Yes    Pain Score  1     Pain Location  Back    Pain Orientation  Lower    Pain Descriptors / Indicators  Dull     Aggravating Factors   overdoing it    Pain Relieving Factors  time    Effect of Pain on Daily Activities  will monitor but will not directly address         [facility] PT Assessment - 08/30/18 0001      Assessment   Medical Diagnosis  CIDP; spastic gait    Referring Provider (PT)  Narda Amber, DO    Onset Date/Surgical Date  12/29/16    Hand Dominance  Right    Next MD Visit  02/02/2019    Prior Therapy  [facility] at [facility] Adult PT Treatment/Exercise - 08/30/18 0001      Lumbar Exercises: Stretches   Passive Hamstring Stretch  Right;Left;1 rep;30 seconds    Lower Trunk Rotation  3 reps;10 seconds    Hip Flexor Stretch  Right;Left;2 reps;30 seconds    Piriformis Stretch  Right;Left;2 reps;20 seconds    Gastroc Stretch  Right;Left;2 reps;30 seconds   incline board     Lumbar Exercises: Aerobic   Nustep  L5: arms/legs 8.5 min    PTA present to discuss progress     Balance Exercises - 08/30/18 0831      Balance Exercises: Standing   Standing Eyes Opened  Narrow base of support (BOS);Head turns;Foam/compliant surface;20 secs;2  reps   vertical, horiz, and diagonal head turns   Standing Eyes Closed  Wide (BOA);Narrow base of support (BOS);Foam/compliant surface;2 reps;20 secs    Tandem Stance  Eyes open;Intermittent upper extremity support;1 rep;30 secs    SLS  Eyes open;Solid surface;1 rep;15 secs      Balance Exercises: Seated   Dynamic Sitting  Eyes opened;No upper extremity support;Other (comment)   LAQ x 10, marching x 20 (dynadisc and ball)     Reviewed technique and sequence for use of SPC.  Pt verbalized understanding.       PT Long Term Goals - 07/28/18 1619      PT LONG TERM GOAL #1   Title  independent with HEP    Status  New    Target Date  09/08/18      PT LONG TERM GOAL #2   Title  improve BERG balance score to >/= 51/56 for improved balance    Status  New    Target Date  09/08/18      PT LONG TERM GOAL #3   Title  improve timed up  and go to < 13 sec for improved mobility and decreased fall risk    Status  New    Target Date  09/08/18      PT LONG TERM GOAL #4   Title  amb > 300' modified independent without LOB for improved mobility and safety    Status  New    Target Date  09/08/18            Plan - 08/30/18 0855    Clinical Impression Statement  Pt continues to require CGA- Min A for standing static balance exercises with narrow base of support, as well as seated dynamic core work on physioball.  Pt gradually progressing towards goals.    Personal Factors and Comorbidities  Time since onset of injury/illness/exacerbation;Comorbidity 2    Examination-Activity Limitations  Locomotion Level;Transfers;Bed Mobility;Stand    Rehab Potential  Good    PT Frequency  2x / week    PT Duration  6 weeks    PT Treatment/Interventions  ADLs/Self Care Home Management;Cryotherapy;Electrical Stimulation;Ultrasound;Moist Heat;Gait training;Stair training;Functional mobility training;Therapeutic activities;Therapeutic exercise;Balance training;Patient/family education;Neuromuscular re-education;Manual techniques;Taping;Vestibular    PT Next Visit Plan  check goals.    Consulted and Agree with Plan of Care  Patient       Patient will benefit from skilled therapeutic intervention in order to improve the following deficits and impairments:  Abnormal gait, Difficulty walking, Impaired tone, Increased muscle spasms, Decreased activity tolerance, Pain, Impaired flexibility, Decreased balance, Decreased mobility, Decreased strength  Visit Diagnosis: 1. Unsteadiness on feet   2. Muscle weakness (generalized)   3. Other abnormalities of gait and mobility   4. Acute inflammatory demyelinating polyneuropathy [facility])        Problem List Patient Active Problem List   Diagnosis Date Noted  . Chronic bilateral low back pain without sciatica 07/21/2018  . 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 CIDP (chronic inflammatory  demyelinating polyneuropathy) ([place]) 12/30/2017  . Edema 03/01/2017  . Hypokalemia   . Slow transit constipation   . Steroid-induced hyperglycemia   . Urinary retention   . Accidental drug overdose   . Postoperative pain   . Hyponatremia   . Cervical myelopathy ([place]) 02/01/2017  . Shortness of breath at rest   . Hypoglycemia   . Spondylosis, cervical, with myelopathy   . Neuropathic pain   . Benign essential HTN   . Labile blood glucose   .  Muscle spasm   . GAD (generalized anxiety disorder)   . Acute inflammatory demyelinating polyneuropathy ([place]) 01/11/2017  . Anxiety state   . Neurogenic bladder   . Depression 12/30/2016  . Leg weakness, bilateral 12/30/2016  . 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 GBS (Guillain Barre syndrome) ([place])   . AIDP (acute inflammatory demyelinating polyneuropathy) ([place]) 11/27/2016  . Weakness 11/20/2016  . Weakness of both arms 10/30/2016  . 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 Obstructive sleep apnea 03/24/2016  . Numbness 03/18/2016  . Mild nonproliferative diabetic retinopathy of both eyes without macular edema associated with type 2 diabetes mellitus ([place]) 05/24/2015  . Nuclear cataract of both eyes 05/24/2015  . Diabetes mellitus without complication ([place]) 72/90/2111  . 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 Eustachian tube dysfunction 02/12/2015  . Acute maxillary sinusitis 02/12/2015  . Wellness examination 08/22/2014  . Alkaline phosphatase elevation 08/22/2014  . 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 Benign non-nodular prostatic hyperplasia with lower urinary tract symptoms 01/31/2014  . Lesion of eyelid 09/26/2013  . Screening for prostate cancer 04/24/2013  . Diabetes ([place]) 06/16/2012  . ED (erectile dysfunction) of organic origin 06/03/2012  . Routine general medical examination at a health care facility 04/10/2012  . BPH (benign prostatic hyperplasia)  02/03/2010  . HEMOCCULT POSITIVE STOOL 05/25/2008  . ELBOW PAIN, RIGHT 07/04/2007  . Dyslipidemia 02/16/2007  . ANXIETY 02/16/2007  . ERECTILE DYSFUNCTION 02/16/2007  . Essential hypertension 02/16/2007  . TESTICULAR MASS, LEFT 02/16/2007  . KNEE PAIN, CHRONIC 02/16/2007  . Michell Heinrich OF 02/16/2007   Kerin Perna, PTA 08/30/18 9:05 AM  [place], [place], [address] Phone: [phone]   Fax:  [phone]  Name: Kenneth Pace MRN: [id number] Date of Birth: 16-May-1950

## 2018-08-31 ENCOUNTER — Ambulatory Visit (INDEPENDENT_AMBULATORY_CARE_PROVIDER_SITE_OTHER): Payer: BC Managed Care – PPO | Admitting: Endocrinology

## 2018-08-31 ENCOUNTER — Encounter: Payer: Self-pay | Admitting: Endocrinology

## 2018-08-31 ENCOUNTER — Other Ambulatory Visit: Payer: Self-pay

## 2018-08-31 VITALS — BP 136/68 | HR 80 | Ht 72.0 in | Wt 240.8 lb

## 2018-08-31 DIAGNOSIS — E10319 Type 1 diabetes mellitus with unspecified diabetic retinopathy without macular edema: Secondary | ICD-10-CM

## 2018-08-31 DIAGNOSIS — R609 Edema, unspecified: Secondary | ICD-10-CM | POA: Diagnosis not present

## 2018-08-31 DIAGNOSIS — E119 Type 2 diabetes mellitus without complications: Secondary | ICD-10-CM

## 2018-08-31 LAB — POCT GLYCOSYLATED HEMOGLOBIN (HGB A1C): Hemoglobin A1C: 7.1 % — AB (ref 4.0–5.6)

## 2018-08-31 MED ORDER — PARADIGM PUMP RESERVOIR 3ML MISC: 1.0000 | 3 refills | Status: DC

## 2018-08-31 MED ORDER — "QUICK-SET INFUSION 43"" 9MM MISC": 1.0000 | 3 refills | Status: DC

## 2018-08-31 MED ORDER — INSULIN LISPRO 100 UNIT/ML ~~LOC~~ SOLN: SUBCUTANEOUS | 3 refills | Status: DC

## 2018-08-31 NOTE — Progress Notes (Signed)
Subjective:    Patient ID: Kenneth Pace, male    DOB: 01/05/1951, 68 y.o.   MRN: [id number]  HPI Pt returns for f/u of diabetes mellitus:  DM type: 1 Dx'ed: 5732 Complications: retinopathy and polyneuropathy.   Therapy: insulin since dx.  DKA: only at dx.   Severe hypoglycemia: most recently in 2014.  Pancreatitis: never Other: he has a medtronic pump and continuous glucose monitor; he is retired. continuous glucose monitor ID is [email or web address] and password is [email or web address].  Interval history:  basal rate of 4.0 (he reduced) units/hr 6 AM-10 PM, and 2.2 units/hr 10 PM-6 AM mealtime bolus of 5 units with each meal.   correction bolus (which some people call "sensitivity," or "insulin sensitivity ratio," or just "isr") of 1 unit for each 50 by which your glucose exceeds 100.   suspend the pump for 1-2 hrs, with activity.   TDD is 99 units (92 of which are basal).   I reviewed continuous glucose monitor data.  Glucose varies from 50-450.  It is in general highest fasting, and lowest at noon.  When he reduced the insulin, the am downward excursion stopped.   Past Medical History:  Diagnosis Date  . ADENOIDECTOMY, HX OF 02/16/2007  . Anxiety    pt. states he does not have anxiety.  Marland Kitchen BENIGN PROSTATIC HYPERTROPHY, WITH OBSTRUCTION 02/03/2010  . DIABETES MELLITUS, TYPE I, UNCONTROLLED 12/29/2006  . ERECTILE DYSFUNCTION 02/16/2007  . HYPERLIPIDEMIA 02/16/2007  . HYPERTENSION 02/16/2007  . Sleep apnea    uses CPAP  . TESTICULAR MASS, LEFT 02/16/2007    Past Surgical History:  Procedure Laterality Date  . ANTERIOR CERVICAL DECOMP/DISCECTOMY FUSION N/A 02/01/2017   Procedure: ANTERIOR CERVICAL DECOMPRESSION/DISCECTOMY FUSION CERVICAL THREE-FOUR , CERVICAL FOUR-FIVE  CERVICAL FIVE-SIX;  Surgeon: Consuella Lose, MD;  Location: [facility];  Service: Neurosurgery;  Laterality: N/A;  . COLONOSCOPY  02/05/2010   [facility]  . KNEE SURGERY     2 Lknee arthroscopy, 3 R knee arthroscpoy   . KNEE SURGERY Right 11/12/2016  . TONSILLECTOMY      Social History   Socioeconomic History  . Marital status: Married    Spouse name: Not on file  . Number of children: Not on file  . Years of education: Not on file  . Highest education level: Not on file  Occupational History  . Occupation: Lobbyist: [employer]    Comment: Retail buyer  Social Needs  . Financial resource strain: Not on file  . Food insecurity    Worry: Not on file    Inability: Not on file  . Transportation needs    Medical: Not on file    Non-medical: Not on file  Tobacco Use  . Smoking status: Never Smoker  . Smokeless tobacco: Never Used  Substance and Sexual Activity  . Alcohol use: No  . Drug use: No  . Sexual activity: Not on file  Lifestyle  . Physical activity    Days per week: Not on file    Minutes per session: Not on file  . Stress: Not on file  Relationships  . Social Herbalist on phone: Not on file    Gets together: Not on file    Attends religious service: Not on file    Active member of club or organization: Not on file    Attends meetings of clubs or organizations: Not on file    Relationship status: Not on file  . Intimate  partner violence    Fear of current or ex partner: Not on file    Emotionally abused: Not on file    Physically abused: Not on file    Forced sexual activity: Not on file  Other Topics Concern  . Not on file  Social History Narrative   He works for [employer]   He lives at home with wife.     Highest level of education:  BS, business admin    Current Outpatient Medications on File Prior to Visit  Medication Sig Dispense Refill  . acetaminophen (TYLENOL) 325 MG tablet Take 2 tablets (650 mg total) by mouth every 6 (six) hours as needed for mild pain or headache.    Marland Kitchen amitriptyline (ELAVIL) 75 MG tablet TAKE 1 TABLET(75 MG) BY MOUTH AT BEDTIME 30 tablet 2  . B Complex Vitamins (B-COMPLEX/B-12  PO) Take by mouth.    . baclofen (LIORESAL) 10 MG tablet Take 2 tablets (20 mg total) by mouth 3 (three) times daily. 360 each 3  . celecoxib (CELEBREX) 100 MG capsule TAKE 1 CAPSULE(100 MG) BY MOUTH AT BEDTIME 30 capsule 2  . Coenzyme Q10 (COQ10) 200 MG CAPS Take by mouth.    . Cranberry 400 MG TABS Take 2 each by mouth daily.      . Cyanocobalamin (VITAMIN B-12 CR PO) Take 1 each by mouth daily.      . DULoxetine (CYMBALTA) 60 MG capsule TAKE 1 CAPSULE DAILY 90 capsule 4  . finasteride (PROSCAR) 5 MG tablet Take 5 mg by mouth daily.     Marland Kitchen glucagon 1 MG injection Inject 1 mg into the vein once as needed. 1 each 12  . glucose blood (BAYER CONTOUR NEXT TEST) test strip MEDICALLY NECESSARY FOR USE WITH PUMP; Use to check blood sugar 5 times per day and prn; E11.42 500 each 3  . lisinopril-hydrochlorothiazide (ZESTORETIC) 10-12.5 MG tablet TAKE 1 TABLET DAILY, PLEASE SEE PRIMARY CARE PHYSICIAN FOR ANY FUTURE REFILLS 90 tablet 2  . Multiple Vitamin (MULTIVITAMIN) capsule Take by mouth.    . Multiple Vitamins-Minerals (MULTIVITAMIN,TX-MINERALS) tablet Take 1 tablet by mouth daily.      Marland Kitchen scopolamine (TRANSDERM-SCOP) 1 MG/3DAYS Place 1 patch (1.5 mg total) onto the skin every 3 (three) days. 10 patch 12   No current facility-administered medications on file prior to visit.     No Known Allergies  Family History  Problem Relation Age of Onset  . Dementia Mother   . Diabetes Mellitus I Mother   . Hypertension Father        24  . Healthy Sister   . Rheum arthritis Brother   . Cancer Neg Hx   . Colon cancer Neg Hx   . Esophageal cancer Neg Hx   . Stomach cancer Neg Hx   . Rectal cancer Neg Hx   . Colon polyps Neg Hx     BP 136/68 (BP Location: Left Arm, Patient Position: Sitting, Cuff Size: Large)   Pulse 80   Ht 6' (1.829 m)   Wt 240 lb 12.8 oz (109.2 kg)   SpO2 95%   BMI 32.66 kg/m    Review of Systems No recent weight change.    Objective:   Physical Exam VITAL SIGNS:  See  vs page GENERAL: no distress Pulses: dorsalis pedis intact bilat.   MSK: no deformity of the feet CV: 1+ bilat leg edema Skin:  no ulcer on the feet.  normal color and temp on  the feet.  Several abrasions on the feet.   Neuro: sensation is intact to touch on the feet Ext: there is bilateral onychomycosis of the toenails.   Lab Results  Component Value Date   HGBA1C 7.1 (A) 08/31/2018       Assessment & Plan:  Type 1 DM, with DR: this is the best control this pt should aim for, given this regimen, which does match insulin to her changing needs throughout the day Hypoglycemia: this limits aggressiveness of glycemic control Edema: This limits rx options  Patient Instructions  Please take these pump settings: basal rate of 4.0 units/hr 6 AM-10 PM, and 2.4 units/hr 10 PM-6 AM mealtime bolus of 5 units with each meal.   correction bolus (which some people call "sensitivity," or "insulin sensitivity ratio," or just "isr") of 1 unit for each 50 by which your glucose exceeds 100.  suspend the pump for 1-2 hrs, with activity.  If not, eat a light snack with it.   On this type of insulin schedule, you should eat meals on a regular schedule.  If a meal is missed or significantly delayed, your blood sugar could go low.   Please come back for a follow-up appointment in 2 months.  We are happy to review continuous glucose monitor info in between appointments.

## 2018-08-31 NOTE — Patient Instructions (Addendum)
Please take these pump settings: basal rate of 4.0 units/hr 6 AM-10 PM, and 2.4 units/hr 10 PM-6 AM mealtime bolus of 5 units with each meal.   correction bolus (which some people call "sensitivity," or "insulin sensitivity ratio," or just "isr") of 1 unit for each 50 by which your glucose exceeds 100.  suspend the pump for 1-2 hrs, with activity.  If not, eat a light snack with it.   On this type of insulin schedule, you should eat meals on a regular schedule.  If a meal is missed or significantly delayed, your blood sugar could go low.   Please come back for a follow-up appointment in 2 months.  We are happy to review continuous glucose monitor info in between appointments.

## 2018-09-01 ENCOUNTER — Encounter: Payer: Self-pay | Admitting: Physical Therapy

## 2018-09-01 ENCOUNTER — Ambulatory Visit (INDEPENDENT_AMBULATORY_CARE_PROVIDER_SITE_OTHER): Payer: BC Managed Care – PPO | Admitting: Physical Therapy

## 2018-09-01 ENCOUNTER — Other Ambulatory Visit: Payer: Self-pay

## 2018-09-01 DIAGNOSIS — R2681 Unsteadiness on feet: Secondary | ICD-10-CM

## 2018-09-01 DIAGNOSIS — R2689 Other abnormalities of gait and mobility: Secondary | ICD-10-CM

## 2018-09-01 DIAGNOSIS — M6281 Muscle weakness (generalized): Secondary | ICD-10-CM

## 2018-09-01 NOTE — Therapy (Signed)
[place], [place], [address] Phone: [phone]   Fax:  [phone]  Physical Therapy Treatment  Patient Details  Name: [name] MRN: [id number] Date of Birth: Feb 22, 1950 Referring Provider (PT): Narda Amber, DO   Encounter Date: 09/01/2018  PT End of Session - 09/01/18 0809    Visit Number  10    Number of Visits  12    Date for PT Re-Evaluation  09/08/18    PT Start Time  0804    PT Stop Time  0845    PT Time Calculation (min)  41 min    Activity Tolerance  Patient tolerated treatment well    Behavior During Therapy  [facility] for tasks assessed/performed       Past Medical History:  Diagnosis Date  . ADENOIDECTOMY, HX OF 02/16/2007  . Anxiety    pt. states he does not have anxiety.  Marland Kitchen BENIGN PROSTATIC HYPERTROPHY, WITH OBSTRUCTION 02/03/2010  . DIABETES MELLITUS, TYPE I, UNCONTROLLED 12/29/2006  . ERECTILE DYSFUNCTION 02/16/2007  . HYPERLIPIDEMIA 02/16/2007  . HYPERTENSION 02/16/2007  . Sleep apnea    uses CPAP  . TESTICULAR MASS, LEFT 02/16/2007    Past Surgical History:  Procedure Laterality Date  . ANTERIOR CERVICAL DECOMP/DISCECTOMY FUSION N/A 02/01/2017   Procedure: ANTERIOR CERVICAL DECOMPRESSION/DISCECTOMY FUSION CERVICAL THREE-FOUR , CERVICAL FOUR-FIVE  CERVICAL FIVE-SIX;  Surgeon: Consuella Lose, MD;  Location: [facility];  Service: Neurosurgery;  Laterality: N/A;  . COLONOSCOPY  02/05/2010   [facility]  . KNEE SURGERY     2 Lknee arthroscopy, 3 R knee arthroscpoy  . KNEE SURGERY Right 11/12/2016  . TONSILLECTOMY      There were no vitals filed for this visit.  Subjective Assessment - 09/01/18 0810    Subjective  [name] reports he was a little sore in his back the day following last session, "but it's a good sore". He states he is seeing incremental changes in his gains, not as big as when he first started therapy.    Currently in Pain?  No        [facility] PT  Assessment - 09/01/18 0001      Assessment   Medical Diagnosis  CIDP; spastic gait    Referring Provider (PT)  Narda Amber, DO    Onset Date/Surgical Date  12/29/16    Hand Dominance  Right    Next MD Visit  02/02/2019    Prior Therapy  CIR/OPPT at [facility] to Stand  Able to stand without using hands and stabilize independently    Standing Unsupported  Able to stand safely 2 minutes    Sitting with Back Unsupported but Feet Supported on Floor or Stool  Able to sit safely and securely 2 minutes    Stand to Sit  Sits safely with minimal use of hands    Transfers  Able to transfer safely, minor use of hands    Standing Unsupported with Eyes Closed  Able to stand 10 seconds safely    Standing Unsupported with Feet Together  Able to place feet together independently and stand 1 minute safely    From Standing, Reach Forward with Outstretched Arm  Can reach confidently >25 cm (10")    From Standing Position, Pick up Object from Floor  Able to pick up shoe safely and easily    From Standing Position, Turn to Look Behind Over each Shoulder  Looks behind from both  sides and weight shifts well    Turn 360 Degrees  Able to turn 360 degrees safely in 4 seconds or less   Rt 2.67, Lt 2.65   Standing Unsupported, Alternately Place Feet on Step/Stool  Able to stand independently and complete 8 steps >20 seconds    Standing Unsupported, One Foot in Front  Able to take small step independently and hold 30 seconds   RLE 28.9 sec, LLE 30 sec. tandem   Standing on One Leg  Able to lift leg independently and hold > 10 seconds   RLE 15 sec, LLE 19 sec   Total Score  53      Timed Up and Go Test   Normal TUG (seconds)  9.1        [facility] Adult PT Treatment/Exercise - 09/01/18 0001      Lumbar Exercises: Stretches   Passive Hamstring Stretch  Right;Left;1 rep;30 seconds    Piriformis Stretch  Right;Left;2 reps;30 seconds    Gastroc Stretch  Right;Left;2 reps;30 seconds    incline board     Lumbar Exercises: Aerobic   Nustep  L5: arms/legs 6 min       Lumbar Exercises: Prone   Opposite Arm/Leg Raise  Right arm/Left leg;Left arm/Right leg;5 reps   2 sets, abdomen on red pball; CGA-minA 3 episodes     Balance Exercises - 09/01/18 0849      Balance Exercises: Standing   Standing Eyes Opened  Narrow base of support (BOS);Solid surface;1 rep   60 seconds   Standing Eyes Closed  Foam/compliant surface;1 rep;10 secs    Tandem Stance  Eyes open;1 rep;30 secs   solid surface, no UE support    SLS  Eyes open;Solid surface;1 rep;15 secs    Turning  Right;Left;3 reps    Other Standing Exercises  toe taps to 6" step without support x 10, sit to/stand x 4 reps without UE support              PT Long Term Goals - 09/01/18 0856      PT LONG TERM GOAL #1   Title  independent with HEP    Status  On-going      PT LONG TERM GOAL #2   Title  improve BERG balance score to >/= 51/56 for improved balance    Status  Achieved      PT LONG TERM GOAL #3   Title  improve timed up and go to < 13 sec for improved mobility and decreased fall risk    Status  Achieved      PT LONG TERM GOAL #4   Title  amb > 300' modified independent without LOB for improved mobility and safety    Status  On-going            Plan - 09/01/18 9323    Clinical Impression Statement  Much improved Berg balance score: 53 points, improved from 49 points last week. He has met LTG#2.  Pt had some difficulty with balance in quadruped on physioball, requiring minA to steady at times. Improved TUG to 9 seconds; has met LTG#3.  Pt making great gains towards remaining goals; anticipate d/c in next few visits. (pt will be out of town next week)    Personal Factors and Comorbidities  Time since onset of injury/illness/exacerbation;Comorbidity 2    Examination-Activity Limitations  Locomotion Level;Transfers;Bed Mobility;Stand    Rehab Potential  Good    PT Frequency  2x / week    PT  Duration  6 weeks    PT Treatment/Interventions  ADLs/Self Care Home Management;Cryotherapy;Electrical Stimulation;Ultrasound;Moist Heat;Gait training;Stair training;Functional mobility training;Therapeutic activities;Therapeutic exercise;Balance training;Patient/family education;Neuromuscular re-education;Manual techniques;Taping;Vestibular    PT Next Visit Plan  update HEP.  assess readiness to d/c.    Consulted and Agree with Plan of Care  Patient       Patient will benefit from skilled therapeutic intervention in order to improve the following deficits and impairments:  Abnormal gait, Difficulty walking, Impaired tone, Increased muscle spasms, Decreased activity tolerance, Pain, Impaired flexibility, Decreased balance, Decreased mobility, Decreased strength  Visit Diagnosis: 1. Unsteadiness on feet   2. Muscle weakness (generalized)   3. Other abnormalities of gait and mobility        Problem List Patient Active Problem List   Diagnosis Date Noted  . Chronic bilateral low back pain without sciatica 07/21/2018  . 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 CIDP (chronic inflammatory demyelinating polyneuropathy) ([place]) 12/30/2017  . Edema 03/01/2017  . Hypokalemia   . Slow transit constipation   . Steroid-induced hyperglycemia   . Urinary retention   . Accidental drug overdose   . Postoperative pain   . Hyponatremia   . Cervical myelopathy ([place]) 02/01/2017  . Shortness of breath at rest   . Hypoglycemia   . Spondylosis, cervical, with myelopathy   . Neuropathic pain   . Benign essential HTN   . Labile blood glucose   . Muscle spasm   . GAD (generalized anxiety disorder)   . Acute inflammatory demyelinating polyneuropathy ([place]) 01/11/2017  . Anxiety state   . Neurogenic bladder   . Depression 12/30/2016  . Leg weakness, bilateral 12/30/2016  . 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 GBS (Guillain Barre syndrome) ([place])   . AIDP (acute  inflammatory demyelinating polyneuropathy) ([place]) 11/27/2016  . Weakness 11/20/2016  . Weakness of both arms 10/30/2016  . 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 Obstructive sleep apnea 03/24/2016  . Numbness 03/18/2016  . 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 Nuclear cataract of both eyes 05/24/2015  . Diabetes mellitus without complication ([place]) 26/37/8588  . 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 Eustachian tube dysfunction 02/12/2015  . Acute maxillary sinusitis 02/12/2015  . Wellness examination 08/22/2014  . Alkaline phosphatase elevation 08/22/2014  . 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 Benign non-nodular prostatic hyperplasia with lower urinary tract symptoms 01/31/2014  . Lesion of eyelid 09/26/2013  . Screening for prostate cancer 04/24/2013  . Diabetes ([place]) 06/16/2012  . ED (erectile dysfunction) of organic origin 06/03/2012  . Routine general medical examination at a health care facility 04/10/2012  . BPH (benign prostatic hyperplasia) 02/03/2010  . HEMOCCULT POSITIVE STOOL 05/25/2008  . ELBOW PAIN, RIGHT 07/04/2007  . Dyslipidemia 02/16/2007  . ANXIETY 02/16/2007  . ERECTILE DYSFUNCTION 02/16/2007  . Essential hypertension 02/16/2007  . TESTICULAR MASS, LEFT 02/16/2007  . KNEE PAIN, CHRONIC 02/16/2007  . Michell Heinrich OF 02/16/2007   Kerin Perna, PTA 09/01/18 8:59 AM  [facility] [place], [place], [address] Phone: [phone]   Fax:  [phone]  Name: MAURISIO RUDDY MRN: [id number] Date of Birth: 1950-09-05

## 2018-09-02 ENCOUNTER — Telehealth: Payer: Self-pay | Admitting: Endocrinology

## 2018-09-02 NOTE — Telephone Encounter (Signed)
Patient called re: Patient requests the RX's that were sent to [facility] on 08/31/18 (Insulin Infusion Pump Supplies (PARADIGM RESERVOIR 3ML) MISC Order# [id number]   AND  Insulin Infusion Pump Supplies (QUICK-SET INFUSION 43" 9MM) MISC Order# [id number]  AND   Enlite Glucose Sensor Order# [id number]  AND   Tegaderm Order# [id number]-size 4 inch x 4 1/2 inches)  need to be sent to [facility] Medical supplies/Pharm at: Diabetes portion fax# [phone] Contact ph# [phone]

## 2018-09-05 NOTE — Telephone Encounter (Signed)
It appears this pt may not understand process in that he needs to request his refills from [facility] and they will send our office the required orders to complete along with other required docs (office notes, etc). Per our discussion. Please follow up and let me know what I need to do to complete his request.  Thank you, Kenneth Pace

## 2018-09-05 NOTE — Telephone Encounter (Signed)
Thank you for your follow up. Will continue to await pt follow through.

## 2018-09-05 NOTE — Telephone Encounter (Signed)
I phoned [facility] and this patient is not listed with them.  Patient was phoned and message left that he needs to sign up with them before we can send prescriptions to them for his insulin pump supplies.

## 2018-09-12 ENCOUNTER — Other Ambulatory Visit: Payer: Self-pay

## 2018-09-12 ENCOUNTER — Ambulatory Visit (INDEPENDENT_AMBULATORY_CARE_PROVIDER_SITE_OTHER): Payer: BC Managed Care – PPO | Admitting: Physical Therapy

## 2018-09-12 DIAGNOSIS — R2689 Other abnormalities of gait and mobility: Secondary | ICD-10-CM | POA: Diagnosis not present

## 2018-09-12 DIAGNOSIS — R2681 Unsteadiness on feet: Secondary | ICD-10-CM

## 2018-09-12 DIAGNOSIS — M6281 Muscle weakness (generalized): Secondary | ICD-10-CM | POA: Diagnosis not present

## 2018-09-12 NOTE — Therapy (Signed)
[facility] [address], [place], [address] Phone: [phone]   Fax:  [phone]  Physical Therapy Treatment/Recertification  Patient Details  Name: Kenneth Pace MRN: [id number] Date of Birth: April 11, 1950 Referring Provider (PT): Narda Amber, DO   Encounter Date: 09/12/2018  PT End of Session - 09/12/18 0845    Visit Number  11    Number of Visits  15    Date for PT Re-Evaluation  10/10/18    PT Start Time  0758    PT Stop Time  0836    PT Time Calculation (min)  38 min    Activity Tolerance  Patient tolerated treatment well    Behavior During Therapy  [facility] for tasks assessed/performed       Past Medical History:  Diagnosis Date  . ADENOIDECTOMY, HX OF 02/16/2007  . Anxiety    pt. states he does not have anxiety.  Marland Kitchen BENIGN PROSTATIC HYPERTROPHY, WITH OBSTRUCTION 02/03/2010  . DIABETES MELLITUS, TYPE I, UNCONTROLLED 12/29/2006  . ERECTILE DYSFUNCTION 02/16/2007  . HYPERLIPIDEMIA 02/16/2007  . HYPERTENSION 02/16/2007  . Sleep apnea    uses CPAP  . TESTICULAR MASS, LEFT 02/16/2007    Past Surgical History:  Procedure Laterality Date  . ANTERIOR CERVICAL DECOMP/DISCECTOMY FUSION N/A 02/01/2017   Procedure: ANTERIOR CERVICAL DECOMPRESSION/DISCECTOMY FUSION CERVICAL THREE-FOUR , CERVICAL FOUR-FIVE  CERVICAL FIVE-SIX;  Surgeon: Consuella Lose, MD;  Location: [place];  Service: Neurosurgery;  Laterality: N/A;  . COLONOSCOPY  02/05/2010   [facility]  . KNEE SURGERY     2 Lknee arthroscopy, 3 R knee arthroscpoy  . KNEE SURGERY Right 11/12/2016  . TONSILLECTOMY      There were no vitals filed for this visit.  Subjective Assessment - 09/12/18 0804    Subjective  Pt returns from being away for vacation.  He states he's come to realize he "may never be 100%". He'd like to continue working on core, and see if he is still progressing.     Currently in Pain?  Yes    Pain Score  1     Pain Location   Back    Pain Orientation  Right;Left    Pain Descriptors / Indicators  Dull    Aggravating Factors   overdoing it    Pain Relieving Factors  time    Effect of Pain on Daily Activities  will monitor but will not directly address         [facility] PT Assessment - 09/12/18 0001      Assessment   Medical Diagnosis  CIDP; spastic gait    Referring Provider (PT)  Narda Amber, DO    Onset Date/Surgical Date  12/29/16    Hand Dominance  Right    Next MD Visit  02/02/2019    Prior Therapy  CIR/OPPT at [facility]      Ambulation/Gait   Ambulation/Gait Assistance  6: Modified independent (Device/Increase time)    Ambulation Distance (Feet)  320 Feet    Assistive device  None    Gait Pattern  Step-through pattern;Decreased arm swing - right;Decreased arm swing - left;Decreased hip/knee flexion - right;Decreased hip/knee flexion - left;Wide base of support    Ambulation Surface  Level;Indoor      Timed Up and Go Test   Normal TUG (seconds)  9.8        [facility] Adult PT Treatment/Exercise - 09/12/18 0001      Lumbar Exercises: Aerobic   Nustep  L5: arms/legs 5 min  Other Aerobic Exercise  laps around gym: see gait.       Lumbar Exercises: Standing   Other Standing Lumbar Exercises  SLS with toe taps front, side, back x 5 each leg, with intermittent UE support to steady; braiding and side stepping (with increased step heigh, slow controlled movements) Rt/Lt x 10 ft x 2 reps each direction with CGA and intermittent UE support on counter to steady.       Lumbar Exercises: Quadruped   Other Quadruped Lumbar Exercises  supine to/from standing with cues on sequence for safety    Other Quadruped Lumbar Exercises  single leg high kneeling (knee on 3" pad) with wood chop, and reverse wood chop holding 4.5# ball x 10 reps, each leg forward.          PT Long Term Goals - 09/12/18 1016      PT LONG TERM GOAL #1   Title  independent with HEP    Status  On-going    Target Date  10/10/18      PT  LONG TERM GOAL #2   Title  improve BERG balance score to >/= 51/56 for improved balance    Status  Achieved      PT LONG TERM GOAL #3   Title  improve timed up and go to < 13 sec for improved mobility and decreased fall risk    Status  Achieved      PT LONG TERM GOAL #4   Title  amb > 300' modified independent without LOB for improved mobility and safety    Status  Achieved            Plan - 09/12/18 5093    Clinical Impression Statement  Pt able to ambulate 300+ ft without overt LOB; has met LTG#4.  TUG was 9.8 sec.  Pt was challenged with single leg  and high kneeling dynamic balance exercises, requiring CGA and UE to steady.  Pt has made good gains towards goals. Anticipate d/c in next visit or two.  SFM: Recommend PT up to 1x/wk, with plan to see biweekly x 4 wks to ensure proper HEP and good carryover with program.    Personal Factors and Comorbidities  Time since onset of injury/illness/exacerbation;Comorbidity 2    Comorbidities  ACDF, CIDP    Examination-Activity Limitations  Locomotion Level;Transfers;Bed Mobility;Stand    PT Frequency  1x / week    PT Duration  4 weeks    PT Treatment/Interventions  ADLs/Self Care Home Management;Cryotherapy;Electrical Stimulation;Ultrasound;Moist Heat;Gait training;Stair training;Functional mobility training;Therapeutic activities;Therapeutic exercise;Balance training;Patient/family education;Neuromuscular re-education;Manual techniques;Taping;Vestibular    PT Next Visit Plan  continue core and balance exercises; update HEP.    Consulted and Agree with Plan of Care  Patient       Patient will benefit from skilled therapeutic intervention in order to improve the following deficits and impairments:  Abnormal gait, Difficulty walking, Impaired tone, Increased muscle spasms, Decreased activity tolerance, Pain, Impaired flexibility, Decreased balance, Decreased mobility, Decreased strength  Visit Diagnosis: 1. Unsteadiness on feet   2.  Muscle weakness (generalized)   3. Other abnormalities of gait and mobility        Problem List Patient Active Problem List   Diagnosis Date Noted  . Chronic bilateral low back pain without sciatica 07/21/2018  . 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 CIDP (chronic inflammatory demyelinating polyneuropathy) ([place]) 12/30/2017  . Edema 03/01/2017  . Hypokalemia   . Slow transit constipation   . Steroid-induced hyperglycemia   . Urinary  retention   . Accidental drug overdose   . Postoperative pain   . Hyponatremia   . Cervical myelopathy ([place]) 02/01/2017  . Shortness of breath at rest   . Hypoglycemia   . Spondylosis, cervical, with myelopathy   . Neuropathic pain   . Benign essential HTN   . Labile blood glucose   . Muscle spasm   . GAD (generalized anxiety disorder)   . Acute inflammatory demyelinating polyneuropathy ([place]) 01/11/2017  . Anxiety state   . Neurogenic bladder   . Depression 12/30/2016  . Leg weakness, bilateral 12/30/2016  . 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 GBS (Guillain Barre syndrome) ([place])   . AIDP (acute inflammatory demyelinating polyneuropathy) ([place]) 11/27/2016  . Weakness 11/20/2016  . Weakness of both arms 10/30/2016  . 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 Obstructive sleep apnea 03/24/2016  . Numbness 03/18/2016  . Mild nonproliferative diabetic retinopathy of both eyes without macular edema associated with type 2 diabetes mellitus ([place]) 05/24/2015  . Nuclear cataract of both eyes 05/24/2015  . Diabetes mellitus without complication ([place]) 62/94/7654  . 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 Eustachian tube dysfunction 02/12/2015  . Acute maxillary sinusitis 02/12/2015  . Wellness examination 08/22/2014  . Alkaline phosphatase elevation 08/22/2014  . 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 Benign non-nodular prostatic hyperplasia with lower urinary tract symptoms  01/31/2014  . Lesion of eyelid 09/26/2013  . Screening for prostate cancer 04/24/2013  . Diabetes ([place]) 06/16/2012  . ED (erectile dysfunction) of organic origin 06/03/2012  . Routine general medical examination at a health care facility 04/10/2012  . BPH (benign prostatic hyperplasia) 02/03/2010  . HEMOCCULT POSITIVE STOOL 05/25/2008  . ELBOW PAIN, RIGHT 07/04/2007  . Dyslipidemia 02/16/2007  . ANXIETY 02/16/2007  . ERECTILE DYSFUNCTION 02/16/2007  . Essential hypertension 02/16/2007  . TESTICULAR MASS, LEFT 02/16/2007  . KNEE PAIN, CHRONIC 02/16/2007  . ADENOIDECTOMY, HX OF 02/16/2007   Kerin Perna, PTA 09/12/18 10:19 AM   Laureen Abrahams, PT, DPT 09/12/18 10:19 AM     [facility] [place] [address] [address] [place], [place], [address] Phone: [phone]   Fax:  [phone]  Name: Kenneth Pace MRN: [id number] Date of Birth: 1950/06/29

## 2018-09-13 ENCOUNTER — Ambulatory Visit (HOSPITAL_COMMUNITY)
Admission: RE | Admit: 2018-09-13 | Discharge: 2018-09-13 | Disposition: A | Discharge status: Home / Self Care | Payer: BC Managed Care – PPO | Source: Ambulatory Visit | Attending: Neurology | Admitting: Neurology

## 2018-09-13 DIAGNOSIS — G6181 Chronic inflammatory demyelinating polyneuritis: Secondary | ICD-10-CM | POA: Insufficient documentation

## 2018-09-13 MED ORDER — IMMUNE GLOBULIN (HUMAN) 20 GM/200ML IV SOLN
1.0000 g/kg | INTRAVENOUS | Status: DC
  Administered 2018-09-13: 110 g via INTRAVENOUS
  Filled 2018-09-13 (×2): qty 100

## 2018-09-14 ENCOUNTER — Encounter: Payer: BC Managed Care – PPO | Admitting: Physical Therapy

## 2018-09-19 ENCOUNTER — Encounter: Payer: BC Managed Care – PPO | Admitting: Physical Medicine & Rehabilitation

## 2018-09-21 ENCOUNTER — Other Ambulatory Visit: Payer: Self-pay | Admitting: Physical Medicine & Rehabilitation

## 2018-09-26 ENCOUNTER — Encounter: Payer: Self-pay | Admitting: Physical Therapy

## 2018-09-26 ENCOUNTER — Ambulatory Visit (INDEPENDENT_AMBULATORY_CARE_PROVIDER_SITE_OTHER): Payer: BC Managed Care – PPO | Admitting: Physical Therapy

## 2018-09-26 ENCOUNTER — Other Ambulatory Visit: Payer: Self-pay

## 2018-09-26 DIAGNOSIS — M6281 Muscle weakness (generalized): Secondary | ICD-10-CM | POA: Diagnosis not present

## 2018-09-26 DIAGNOSIS — R2681 Unsteadiness on feet: Secondary | ICD-10-CM | POA: Diagnosis not present

## 2018-09-26 DIAGNOSIS — R2689 Other abnormalities of gait and mobility: Secondary | ICD-10-CM

## 2018-09-26 NOTE — Patient Instructions (Signed)
Access Code: [email or web address]  URL: [email or web address]  Date: 09/26/2018  Prepared by: Faustino Congress   Exercises  Hooklying Hamstring Stretch with Strap - 3 reps - 1 sets - 30 second hold - 2x daily - 7x weekly  Supine Piriformis Stretch with Foot on Ground - 3 reps - 1 sets - 20 seconds hold - 2x daily - 7x weekly  Gastroc Stretch on Wall - 2-3 reps - 1 sets - 20 hold - 2x daily - 7x weekly  Doorway Pec Stretch at 90 Degrees Abduction - 2-3 reps - 1 sets - 20 hold - 2x daily - 7x weekly  Seated Transversus Abdominis Bracing - 5 reps - 1 sets - 10 hold - 2x daily - 7x weekly  Supine Posterior Pelvic Tilt - 10 reps - 1-2 sets - 5 sec hold - 2x daily - 7x weekly  Bent Knee Fallouts - 10 reps - 1-2 sets - 2x daily - 7x weekly  Supine March - 10 reps - 1-2 sets - 2x daily - 7x weekly  Supine Heel Slides - 10 reps - 1-2 sets - 2x daily - 7x weekly  Sit Up with Arm Reach - 10 reps - 1 sets - 3-5 sec hold - 1x daily - 7x weekly

## 2018-09-26 NOTE — Therapy (Signed)
[facility] [address] [place], [place], [address] Phone: [phone]   Fax:  [phone]  Physical Therapy Treatment  Patient Details  Name: Kenneth Pace MRN: [id number] Date of Birth: August 18, 1950 Referring Provider (PT): Narda Amber, DO   Encounter Date: 09/26/2018  PT End of Session - 09/26/18 0845    Visit Number  12    Number of Visits  15    Date for PT Re-Evaluation  10/10/18    PT Start Time  0802    PT Stop Time  0840    PT Time Calculation (min)  38 min    Activity Tolerance  Patient tolerated treatment well    Behavior During Therapy  [facility] for tasks assessed/performed       Past Medical History:  Diagnosis Date  . ADENOIDECTOMY, HX OF 02/16/2007  . Anxiety    pt. states he does not have anxiety.  Marland Kitchen BENIGN PROSTATIC HYPERTROPHY, WITH OBSTRUCTION 02/03/2010  . DIABETES MELLITUS, TYPE I, UNCONTROLLED 12/29/2006  . ERECTILE DYSFUNCTION 02/16/2007  . HYPERLIPIDEMIA 02/16/2007  . HYPERTENSION 02/16/2007  . Sleep apnea    uses CPAP  . TESTICULAR MASS, LEFT 02/16/2007    Past Surgical History:  Procedure Laterality Date  . ANTERIOR CERVICAL DECOMP/DISCECTOMY FUSION N/A 02/01/2017   Procedure: ANTERIOR CERVICAL DECOMPRESSION/DISCECTOMY FUSION CERVICAL THREE-FOUR , CERVICAL FOUR-FIVE  CERVICAL FIVE-SIX;  Surgeon: Consuella Lose, MD;  Location: [place];  Service: Neurosurgery;  Laterality: N/A;  . COLONOSCOPY  02/05/2010   [facility]  . KNEE SURGERY     2 Lknee arthroscopy, 3 R knee arthroscpoy  . KNEE SURGERY Right 11/12/2016  . TONSILLECTOMY      There were no vitals filed for this visit.  Subjective Assessment - 09/26/18 0804    Subjective  went tubing yesterday; overall doing well    Patient Stated Goals  learn exercises, gain strength    Currently in Pain?  No/denies                       [facility] Adult PT Treatment/Exercise - 09/26/18 0805      Lumbar Exercises:  Stretches   Passive Hamstring Stretch  Right;Left;2 reps;30 seconds    Piriformis Stretch  Right;Left;2 reps;30 seconds      Lumbar Exercises: Aerobic   Recumbent Bike  L2 x 6 min      Lumbar Exercises: Seated   Other Seated Lumbar Exercises  seated abdominal bracing 5x10 sec      Lumbar Exercises: Supine   Pelvic Tilt  10 reps;10 seconds    Clam  10 reps    Clam Limitations  core engaged, mod cues for technique    Heel Slides  10 reps    Heel Slides Limitations  core engaged, mod cues for technique    Bent Knee Raise  10 reps    Bent Knee Raise Limitations  core engaged, mod cues for technique    Other Supine Lumbar Exercises  mini crunch (hands on thighs) x 10 reps             PT Education - 09/26/18 0845    Education Details  HEP    Person(s) Educated  Patient    Methods  Explanation;Demonstration;Handout    Comprehension  Verbalized understanding;Returned demonstration          PT Long Term Goals - 09/12/18 1016      PT LONG TERM GOAL #1   Title  independent with HEP  Status  On-going    Target Date  10/10/18      PT LONG TERM GOAL #2   Title  improve BERG balance score to >/= 51/56 for improved balance    Status  Achieved      PT LONG TERM GOAL #3   Title  improve timed up and go to < 13 sec for improved mobility and decreased fall risk    Status  Achieved      PT LONG TERM GOAL #4   Title  amb > 300' modified independent without LOB for improved mobility and safety    Status  Achieved            Plan - 09/26/18 0846    Clinical Impression Statement  Added core exercises to HEP today, with pt needing cues for proper technique.  Plan to follow up in 2 wks to ensure that pt doing well.  Anticipate d/c at that time.    Personal Factors and Comorbidities  Time since onset of injury/illness/exacerbation;Comorbidity 2    Comorbidities  ACDF, CIDP    Examination-Activity Limitations  Locomotion Level;Transfers;Bed Mobility;Stand    PT Frequency   1x / week    PT Duration  4 weeks    PT Treatment/Interventions  ADLs/Self Care Home Management;Cryotherapy;Electrical Stimulation;Ultrasound;Moist Heat;Gait training;Stair training;Functional mobility training;Therapeutic activities;Therapeutic exercise;Balance training;Patient/family education;Neuromuscular re-education;Manual techniques;Taping;Vestibular    PT Next Visit Plan  plan for d/c    PT Home Exercise Plan  [place] and Agree with Plan of Care  Patient       Patient will benefit from skilled therapeutic intervention in order to improve the following deficits and impairments:  Abnormal gait, Difficulty walking, Impaired tone, Increased muscle spasms, Decreased activity tolerance, Pain, Impaired flexibility, Decreased balance, Decreased mobility, Decreased strength  Visit Diagnosis: 1. Unsteadiness on feet   2. Muscle weakness (generalized)   3. Other abnormalities of gait and mobility        Problem List Patient Active Problem List   Diagnosis Date Noted  . Chronic bilateral low back pain without sciatica 07/21/2018  . 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 Edema 03/01/2017  . Hypokalemia   . Slow transit constipation   . Steroid-induced hyperglycemia   . Urinary retention   . Accidental drug overdose   . Postoperative pain   . Hyponatremia   . Cervical myelopathy ([place]) 02/01/2017  . Shortness of breath at rest   . Hypoglycemia   . Spondylosis, cervical, with myelopathy   . Neuropathic pain   . Benign essential HTN   . Labile blood glucose   . Muscle spasm   . GAD (generalized anxiety disorder)   . Acute inflammatory demyelinating polyneuropathy ([place]) 01/11/2017  . Anxiety state   . Neurogenic bladder   . Depression 12/30/2016  . Leg weakness, bilateral 12/30/2016  . 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 GBS (Guillain Barre  syndrome) ([place])   . AIDP (acute inflammatory demyelinating polyneuropathy) ([place]) 11/27/2016  . Weakness 11/20/2016  . Weakness of both arms 10/30/2016  . 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 Obstructive sleep apnea 03/24/2016  . Numbness 03/18/2016  . Mild nonproliferative diabetic retinopathy of both eyes without macular edema associated with type 2 diabetes mellitus ([place]) 05/24/2015  . Nuclear cataract of both eyes 05/24/2015  . Diabetes mellitus without complication ([place]) 95/10/3265  . 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 Eustachian tube dysfunction 02/12/2015  .  Acute maxillary sinusitis 02/12/2015  . Wellness examination 08/22/2014  . Alkaline phosphatase elevation 08/22/2014  . 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 Benign non-nodular prostatic hyperplasia with lower urinary tract symptoms 01/31/2014  . Lesion of eyelid 09/26/2013  . Screening for prostate cancer 04/24/2013  . Diabetes ([place]) 06/16/2012  . ED (erectile dysfunction) of organic origin 06/03/2012  . Routine general medical examination at a health care facility 04/10/2012  . BPH (benign prostatic hyperplasia) 02/03/2010  . HEMOCCULT POSITIVE STOOL 05/25/2008  . ELBOW PAIN, RIGHT 07/04/2007  . Dyslipidemia 02/16/2007  . ANXIETY 02/16/2007  . ERECTILE DYSFUNCTION 02/16/2007  . Essential hypertension 02/16/2007  . TESTICULAR MASS, LEFT 02/16/2007  . KNEE PAIN, CHRONIC 02/16/2007  . Michell Heinrich OF 02/16/2007      Laureen Abrahams, PT, DPT 09/26/18 8:48 AM    [facility] [place], [place], [address] Phone: [phone]   Fax:  [phone]  Name: Kenneth Pace MRN: [id number] Date of Birth: 1950/03/04

## 2018-10-04 DIAGNOSIS — M79675 Pain in left toe(s): Secondary | ICD-10-CM | POA: Diagnosis not present

## 2018-10-04 DIAGNOSIS — E119 Type 2 diabetes mellitus without complications: Secondary | ICD-10-CM | POA: Diagnosis not present

## 2018-10-04 DIAGNOSIS — B351 Tinea unguium: Secondary | ICD-10-CM | POA: Diagnosis not present

## 2018-10-04 DIAGNOSIS — M79674 Pain in right toe(s): Secondary | ICD-10-CM | POA: Diagnosis not present

## 2018-10-10 ENCOUNTER — Encounter: Payer: Self-pay | Admitting: Physical Therapy

## 2018-10-10 ENCOUNTER — Ambulatory Visit (INDEPENDENT_AMBULATORY_CARE_PROVIDER_SITE_OTHER): Payer: BC Managed Care – PPO | Admitting: Physical Therapy

## 2018-10-10 ENCOUNTER — Other Ambulatory Visit: Payer: Self-pay

## 2018-10-10 DIAGNOSIS — R2689 Other abnormalities of gait and mobility: Secondary | ICD-10-CM | POA: Diagnosis not present

## 2018-10-10 DIAGNOSIS — M6281 Muscle weakness (generalized): Secondary | ICD-10-CM | POA: Diagnosis not present

## 2018-10-10 DIAGNOSIS — R2681 Unsteadiness on feet: Secondary | ICD-10-CM | POA: Diagnosis not present

## 2018-10-10 NOTE — Therapy (Signed)
[place], [place], [address] Phone: [phone]   Fax:  [phone]  Physical Therapy Treatment/Discharge  Patient Details  Name: Kenneth Pace MRN: [id number] Date of Birth: 10/29/1950 Referring Provider (PT): Kenneth Amber, DO   Encounter Date: 10/10/2018  PT End of Session - 10/10/18 0847    Visit Number  13    Number of Visits  15    Date for PT Re-Evaluation  10/10/18    PT Start Time  0804    PT Stop Time  0842    PT Time Calculation (min)  38 min    Activity Tolerance  Patient tolerated treatment well    Behavior During Therapy  [name] for tasks assessed/performed       Past Medical History:  Diagnosis Date  . ADENOIDECTOMY, HX OF 02/16/2007  . Anxiety    pt. states he does not have anxiety.  Marland Kitchen BENIGN PROSTATIC HYPERTROPHY, WITH OBSTRUCTION 02/03/2010  . DIABETES MELLITUS, TYPE I, UNCONTROLLED 12/29/2006  . ERECTILE DYSFUNCTION 02/16/2007  . HYPERLIPIDEMIA 02/16/2007  . HYPERTENSION 02/16/2007  . Sleep apnea    uses CPAP  . TESTICULAR MASS, LEFT 02/16/2007    Past Surgical History:  Procedure Laterality Date  . ANTERIOR CERVICAL DECOMP/DISCECTOMY FUSION N/A 02/01/2017   Procedure: ANTERIOR CERVICAL DECOMPRESSION/DISCECTOMY FUSION CERVICAL THREE-FOUR , CERVICAL FOUR-FIVE  CERVICAL FIVE-SIX;  Surgeon: Kenneth Lose, MD;  Location: [place];  Service: Neurosurgery;  Laterality: N/A;  . COLONOSCOPY  02/05/2010   [facility]  . KNEE SURGERY     2 Lknee arthroscopy, 3 R knee arthroscpoy  . KNEE SURGERY Right 11/12/2016  . TONSILLECTOMY      There were no vitals filed for this visit.  Subjective Assessment - 10/10/18 0803    Subjective  doing well; having incremental improvements    Patient Stated Goals  learn exercises, gain strength    Currently in Pain?  No/denies                       [facility] Adult PT Treatment/Exercise - 10/10/18 0808      Lumbar  Exercises: Stretches   Passive Hamstring Stretch  Right;Left;30 seconds;3 reps    Piriformis Stretch  Right;Left;30 seconds;3 reps      Lumbar Exercises: Aerobic   Nustep  L5 x 6 min      Lumbar Exercises: Seated   Other Seated Lumbar Exercises  seated abdominal bracing 10 x 5 sec      Lumbar Exercises: Supine   Pelvic Tilt  10 reps;5 seconds    Clam  10 reps    Clam Limitations  core engaged    Heel Slides  10 reps    Heel Slides Limitations  core engaged    Bent Knee Raise  10 reps    Bent Knee Raise Limitations  core engaged    Other Supine Lumbar Exercises  mini crunch (hands on thighs) x 10 reps                  PT Long Term Goals - 10/10/18 0847      PT LONG TERM GOAL #1   Title  independent with HEP    Status  Achieved      PT LONG TERM GOAL #2   Title  improve BERG balance score to >/= 51/56 for improved balance    Status  Achieved      PT LONG TERM GOAL #3   Title  improve timed up and go to < 13 sec for improved mobility and decreased fall risk    Status  Achieved      PT LONG TERM GOAL #4   Title  amb > 300' modified independent without LOB for improved mobility and safety    Status  Achieved            Plan - 10/10/18 0848    Clinical Impression Statement  Pt has met all goals at this time and is ready for d/c.  Encouraged regular exercise to maximize function, pt verbalized understanding.    Personal Factors and Comorbidities  Time since onset of injury/illness/exacerbation;Comorbidity 2    Comorbidities  ACDF, CIDP    Examination-Activity Limitations  Locomotion Level;Transfers;Bed Mobility;Stand    PT Frequency  1x / week    PT Duration  4 weeks    PT Treatment/Interventions  ADLs/Self Care Home Management;Cryotherapy;Electrical Stimulation;Ultrasound;Moist Heat;Gait training;Stair training;Functional mobility training;Therapeutic activities;Therapeutic exercise;Balance training;Patient/family education;Neuromuscular re-education;Manual  techniques;Taping;Vestibular    PT Next Visit Plan  d/c PT today    PT Home Exercise Plan  [place] and Agree with Plan of Care  Patient       Patient will benefit from skilled therapeutic intervention in order to improve the following deficits and impairments:  Abnormal gait, Difficulty walking, Impaired tone, Increased muscle spasms, Decreased activity tolerance, Pain, Impaired flexibility, Decreased balance, Decreased mobility, Decreased strength  Visit Diagnosis: Muscle weakness (generalized)  Unsteadiness on feet  Other abnormalities of gait and mobility     Problem List Patient Active Problem List   Diagnosis Date Noted  . Chronic bilateral low back pain without sciatica 07/21/2018  . 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 CIDP (chronic inflammatory demyelinating polyneuropathy) ([place]) 12/30/2017  . Edema 03/01/2017  . Hypokalemia   . Slow transit constipation   . Steroid-induced hyperglycemia   . Urinary retention   . Accidental drug overdose   . Postoperative pain   . Hyponatremia   . Cervical myelopathy ([place]) 02/01/2017  . Shortness of breath at rest   . Hypoglycemia   . Spondylosis, cervical, with myelopathy   . Neuropathic pain   . Benign essential HTN   . Labile blood glucose   . Muscle spasm   . GAD (generalized anxiety disorder)   . Acute inflammatory demyelinating polyneuropathy ([place]) 01/11/2017  . Anxiety state   . Neurogenic bladder   . Depression 12/30/2016  . Leg weakness, bilateral 12/30/2016  . 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[place]) 12/30/2016  . GBS (Guillain Barre syndrome) ([place])   . AIDP (acute inflammatory demyelinating polyneuropathy) ([place]) 11/27/2016  . Weakness 11/20/2016  . Weakness of both arms 10/30/2016  . 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 Obstructive sleep apnea 03/24/2016  . Numbness 03/18/2016  . Mild nonproliferative diabetic retinopathy of both eyes without macular edema  associated with type 2 diabetes mellitus ([place]) 05/24/2015  . Nuclear cataract of both eyes 05/24/2015  . Diabetes mellitus without complication ([place]) 37/34/2876  . 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 Eustachian tube dysfunction 02/12/2015  . Acute maxillary sinusitis 02/12/2015  . Wellness examination 08/22/2014  . Alkaline phosphatase elevation 08/22/2014  . 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 Benign non-nodular prostatic hyperplasia with lower urinary tract symptoms 01/31/2014  . Lesion of eyelid 09/26/2013  . Screening for prostate cancer 04/24/2013  . Diabetes ([place]) 06/16/2012  . ED (erectile dysfunction) of organic origin 06/03/2012  . Routine general medical examination at a health  care facility 04/10/2012  . BPH (benign prostatic hyperplasia) 02/03/2010  . HEMOCCULT POSITIVE STOOL 05/25/2008  . ELBOW PAIN, RIGHT 07/04/2007  . Dyslipidemia 02/16/2007  . ANXIETY 02/16/2007  . ERECTILE DYSFUNCTION 02/16/2007  . Essential hypertension 02/16/2007  . TESTICULAR MASS, LEFT 02/16/2007  . KNEE PAIN, CHRONIC 02/16/2007  . Michell Heinrich OF 02/16/2007      Laureen Abrahams, PT, DPT 10/10/18 8:49 AM    [facility] [place], [place], [address] Phone: [phone]   Fax:  [phone]  Name: Kenneth Pace MRN: [id number] Date of Birth: 05-09-1950      PHYSICAL THERAPY DISCHARGE SUMMARY  Visits from Start of Care: 13  Current functional level related to goals / functional outcomes: See above   Remaining deficits: See above   Education / Equipment: HEP  Plan: Patient agrees to discharge.  Patient goals were met. Patient is being discharged due to meeting the stated rehab goals.  ?????     Laureen Abrahams, PT, DPT 10/10/18 8:50 AM  [facility] [place],  [address]  [phone]  (office) [phone] (fax)

## 2018-10-11 ENCOUNTER — Encounter: Payer: BC Managed Care – PPO | Admitting: Physical Medicine & Rehabilitation

## 2018-10-25 ENCOUNTER — Other Ambulatory Visit: Payer: Self-pay

## 2018-10-25 ENCOUNTER — Telehealth: Payer: Self-pay | Admitting: Neurology

## 2018-10-25 ENCOUNTER — Ambulatory Visit (HOSPITAL_COMMUNITY)
Admission: RE | Admit: 2018-10-25 | Discharge: 2018-10-25 | Disposition: A | Discharge status: Home / Self Care | Payer: BC Managed Care – PPO | Source: Ambulatory Visit | Attending: Neurology | Admitting: Neurology

## 2018-10-25 DIAGNOSIS — G6181 Chronic inflammatory demyelinating polyneuritis: Secondary | ICD-10-CM | POA: Insufficient documentation

## 2018-10-25 MED ORDER — IMMUNE GLOBULIN (HUMAN) 20 GM/200ML IV SOLN
1.0000 g/kg | INTRAVENOUS | Status: DC
  Administered 2018-10-25: 110 g via INTRAVENOUS
  Filled 2018-10-25: qty 100

## 2018-10-25 NOTE — Telephone Encounter (Signed)
Ordered infusion, awaiting Hospital to schedule oct

## 2018-10-25 NOTE — Telephone Encounter (Signed)
Patient's spouse, Dellis Alyea, called and left a message requesting one more "Iv Igg" injection be scheduled in between now and the patient's next appointment with Dr. Posey Pronto on 01/23/19.

## 2018-10-25 NOTE — Telephone Encounter (Signed)
OK to continue IVIG 1mg /kg x 1 dose every 6 weeks for two cycles (Week of Oct 19th and first week in December).  Thanks.

## 2018-10-25 NOTE — Telephone Encounter (Signed)
Please advise on order

## 2018-10-26 NOTE — Telephone Encounter (Signed)
Scheduled at [facility] short stay for October 19,2020 and dec 1st,2020 for 8am.patient notified of dates

## 2018-10-31 ENCOUNTER — Other Ambulatory Visit: Payer: Self-pay

## 2018-11-01 ENCOUNTER — Encounter: Payer: Self-pay | Admitting: Physical Medicine & Rehabilitation

## 2018-11-01 ENCOUNTER — Encounter
Payer: BC Managed Care – PPO | Attending: Physical Medicine & Rehabilitation | Admitting: Physical Medicine & Rehabilitation

## 2018-11-01 ENCOUNTER — Other Ambulatory Visit: Payer: Self-pay

## 2018-11-01 VITALS — BP 153/89 | HR 83 | Temp 97.7°F | Ht 72.0 in | Wt 235.0 lb

## 2018-11-01 DIAGNOSIS — M792 Neuralgia and neuritis, unspecified: Secondary | ICD-10-CM | POA: Diagnosis not present

## 2018-11-01 DIAGNOSIS — G8929 Other chronic pain: Secondary | ICD-10-CM | POA: Diagnosis not present

## 2018-11-01 DIAGNOSIS — M545 Low back pain: Secondary | ICD-10-CM | POA: Diagnosis not present

## 2018-11-01 DIAGNOSIS — G6181 Chronic inflammatory demyelinating polyneuritis: Secondary | ICD-10-CM | POA: Insufficient documentation

## 2018-11-01 DIAGNOSIS — M791 Myalgia, unspecified site: Secondary | ICD-10-CM | POA: Insufficient documentation

## 2018-11-01 DIAGNOSIS — M4712 Other spondylosis with myelopathy, cervical region: Secondary | ICD-10-CM

## 2018-11-01 DIAGNOSIS — G8918 Other acute postprocedural pain: Secondary | ICD-10-CM | POA: Insufficient documentation

## 2018-11-01 DIAGNOSIS — R269 Unspecified abnormalities of gait and mobility: Secondary | ICD-10-CM | POA: Diagnosis not present

## 2018-11-01 DIAGNOSIS — G959 Disease of spinal cord, unspecified: Secondary | ICD-10-CM

## 2018-11-01 MED ORDER — CELECOXIB 100 MG PO CAPS: ORAL_CAPSULE | ORAL | 2 refills | Status: DC

## 2018-11-01 NOTE — Progress Notes (Signed)
Subjective:    Patient ID: Kenneth Pace, male    DOB: Feb 29, 1950, 68 y.o.   MRN: [id number]   HPI Male with history of T2DM, OSA,,  subacute inflammatory polyradiculoneuropathy treated with IVIG for lower extremity weakness 11/2016 presents for follow up for cervical myelopathy and CIDP and back pain.   Last clinic visit 07/21/2018.  Since that time, patient has been following up with Neuro and receiving IVIG, notes reviewed.  He continues with HEP. He notes improvement in pain/weakness. He has benefit with TENS, Elavil, Cymbalta, Celebrex. Had 1 fall, tripping on water hose doing yard work. Trying to wean off of cane.  Back pain is intermittent.   Pain Inventory Average Pain 3 Pain Right Now 2 My pain is constant and dull  In the last 24 hours, has pain interfered with the following? General activity 3 Relation with others 2 Enjoyment of life 3 What TIME of day is your pain at its worst? evening Sleep?   Good Pain is worse with: bending and some activites Pain improves with: rest, heat/ice and medication Relief from Meds: 6  Mobility walk without assistance walk with assistance use a cane ability to climb steps?  yes do you drive?  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  no  Physicians involved in your care Any changes since last visit?  no   Family History  Problem Relation Age of Onset  . Dementia Mother   . Diabetes Mellitus I Mother   . Hypertension Father        50  . Healthy Sister   . Rheum arthritis Brother   . Cancer Neg Hx   . Colon cancer Neg Hx   . Esophageal cancer Neg Hx   . Stomach cancer Neg Hx   . Rectal cancer Neg Hx   . Colon polyps Neg Hx    Social History   Socioeconomic History  . Marital status: Married    Spouse name: Not on file  . Number of children: Not on file  . Years of education:  Not on file  . Highest education level: Not on file  Occupational History  . 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 Financial resource strain: Not on file  . Food insecurity    Worry: Not on file    Inability: Not on file  . Transportation needs    Medical: Not on file    Non-medical: Not on file  Tobacco Use  . Smoking status: Never Smoker  . Smokeless tobacco: Never Used  Substance and Sexual Activity  . Alcohol use: No  . Drug use: No  . Sexual activity: Not on file  Lifestyle  . Physical activity    Days per week: Not on file    Minutes per session: Not on file  . Stress: Not on file  Relationships  . Social Herbalist on phone: Not on file    Gets together: Not on file    Attends religious service: Not on file    Active member of club or organization: Not on file    Attends meetings of clubs or organizations: Not on file    Relationship status: Not on file  Other Topics Concern  . Not on file  Social History Narrative  He works for [employer]   He lives at home with wife.     Highest level of education:  BS, business admin   Past Surgical History:  Procedure Laterality Date  . ANTERIOR CERVICAL DECOMP/DISCECTOMY FUSION N/A 02/01/2017   Procedure: ANTERIOR CERVICAL DECOMPRESSION/DISCECTOMY FUSION CERVICAL THREE-FOUR , CERVICAL FOUR-FIVE  CERVICAL FIVE-SIX;  Surgeon: Consuella Lose, MD;  Location: [place];  Service: Neurosurgery;  Laterality: N/A;  . COLONOSCOPY  02/05/2010   [facility]  . KNEE SURGERY     2 Lknee arthroscopy, 3 R knee arthroscpoy  . KNEE SURGERY Right 11/12/2016  . TONSILLECTOMY     Past Medical History:  Diagnosis Date  . ADENOIDECTOMY, HX OF 02/16/2007  . Anxiety    pt. states he does not have anxiety.  Marland Kitchen BENIGN PROSTATIC HYPERTROPHY, WITH OBSTRUCTION 02/03/2010  . DIABETES MELLITUS, TYPE I, UNCONTROLLED 12/29/2006  . ERECTILE DYSFUNCTION  02/16/2007  . HYPERLIPIDEMIA 02/16/2007  . HYPERTENSION 02/16/2007  . Sleep apnea    uses CPAP  . TESTICULAR MASS, LEFT 02/16/2007   BP (!) 153/89   Pulse 83   Temp 97.7 F (36.5 C)   Ht 6' (1.829 m)   Wt 235 lb (106.6 kg)   SpO2 97%   BMI 31.87 kg/m   Opioid Risk Score:   Fall Risk Score:  `1  Depression screen PHQ 2/9  Depression screen [facility] 2/9 12/10/2017 07/07/2017 02/26/2017 02/26/2017  Decreased Interest 0 0 0 0  Down, Depressed, Hopeless 0 0 0 0  PHQ - 2 Score 0 0 0 0  Altered sleeping - - 1 -  Tired, decreased energy - - 0 -  Change in appetite - - 0 -  Feeling bad or failure about yourself  - - 0 -  Trouble concentrating - - 0 -  Moving slowly or fidgety/restless - - 1 -  Suicidal thoughts - - 0 -  PHQ-9 Score - - 2 -  Difficult doing work/chores - - Not difficult at all -  Some recent data might be hidden   Review of Systems  Constitutional: Negative.   HENT: Negative.   Eyes: Negative.   Respiratory: Negative.   Cardiovascular: Positive for leg swelling.  Gastrointestinal: Negative.   Endocrine: Negative.   Genitourinary: Negative.   Musculoskeletal: Positive for arthralgias, back pain, joint swelling and myalgias.  Skin: Negative.   Allergic/Immunologic: Negative.   Neurological: Positive for weakness and numbness.       Tingling   Hematological: Negative.   Psychiatric/Behavioral: Negative.       Objective:   Physical Exam Constitutional: He appears well-developed and well-nourished. No distress.  Cardiovascular:No JVD.  Respiratory: Effort normal. No stridor.  GI: He exhibits no distention. Musculoskeletal: No edema.  +TTP b/l lumbosacral PSP Neurological: He is alert. Motor: B/L UE 5/5 proximal to distal  B/l LE: HF 5/5, KE, ADF/PF 5/5 Skin: Intact. Warm and dry.  Psychiatric: He has a normal mood and affect. His behavior is normal. Judgment and thought content normal.     Assessment & Plan:  Male with history of T2DM, OSA,,  subacute  inflammatory polyradiculoneuropathy treated with IVIG for lower extremity weakness 11/2016 presents for follow up for cervical myelopathy and CIDP and back pain.   1.  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  Cont HEP  Cont follow up Neurology -IVIG per Neurology  Cont work   2.  Pain Management:  Gabapentin and Lyrica d/ced due to edema  No benefit with Mobic, Naproxen  Cont Lidoderm patch  Cont TENS  Cont Elavil to 75 qhs   Cont Cymbalta to 60mg  with food  Cont Baclofen per Neurology  Cont Tylenol  Cont Celebrex 100 qhs - has not had labs drawn, will order CMP  3. Gait abnormality  Cont HEP  Cont cane  4. Mechanical low back - multifactorial  Myalgia +/- facet arthropathy  MRI L-spine reviewed, multilevel predominantly foraminal and facet arthropathy  Cont Balcofen per Neurology  Cont Cymbalta   Will consider facet injections in future if necessary, patient would like to hold off  Note written for gym participation - discussed safety   5. Myalgia  Good benefit with TP injections  Does not feel like he needs at present  See #1, #4

## 2018-11-02 ENCOUNTER — Ambulatory Visit (INDEPENDENT_AMBULATORY_CARE_PROVIDER_SITE_OTHER): Payer: BC Managed Care – PPO | Admitting: Endocrinology

## 2018-11-02 ENCOUNTER — Encounter: Payer: Self-pay | Admitting: Endocrinology

## 2018-11-02 VITALS — BP 146/76 | HR 80 | Ht 72.0 in | Wt 236.2 lb

## 2018-11-02 DIAGNOSIS — Z23 Encounter for immunization: Secondary | ICD-10-CM

## 2018-11-02 DIAGNOSIS — E10319 Type 1 diabetes mellitus with unspecified diabetic retinopathy without macular edema: Secondary | ICD-10-CM

## 2018-11-02 DIAGNOSIS — I10 Essential (primary) hypertension: Secondary | ICD-10-CM

## 2018-11-02 DIAGNOSIS — R21 Rash and other nonspecific skin eruption: Secondary | ICD-10-CM

## 2018-11-02 DIAGNOSIS — E119 Type 2 diabetes mellitus without complications: Secondary | ICD-10-CM

## 2018-11-02 LAB — COMPREHENSIVE METABOLIC PANEL
ALT: 22 IU/L (ref 0–44)
AST: 25 IU/L (ref 0–40)
Albumin/Globulin Ratio: 1 — ABNORMAL LOW (ref 1.2–2.2)
Albumin: 4.5 g/dL (ref 3.8–4.8)
Alkaline Phosphatase: 245 IU/L — ABNORMAL HIGH (ref 39–117)
BUN/Creatinine Ratio: 20 (ref 10–24)
BUN: 20 mg/dL (ref 8–27)
Bilirubin Total: 0.4 mg/dL (ref 0.0–1.2)
CO2: 25 mmol/L (ref 20–29)
Calcium: 9.5 mg/dL (ref 8.6–10.2)
Chloride: 102 mmol/L (ref 96–106)
Creatinine, Ser: 0.99 mg/dL (ref 0.76–1.27)
GFR calc Af Amer: 91 mL/min/{1.73_m2} (ref >59)
GFR calc non Af Amer: 79 mL/min/{1.73_m2} (ref >59)
Globulin, Total: 4.3 g/dL (ref 1.5–4.5)
Glucose: 126 mg/dL — ABNORMAL HIGH (ref 65–99)
Potassium: 4.2 mmol/L (ref 3.5–5.2)
Sodium: 139 mmol/L (ref 134–144)
Total Protein: 8.8 g/dL — ABNORMAL HIGH (ref 6.0–8.5)

## 2018-11-02 LAB — POCT GLYCOSYLATED HEMOGLOBIN (HGB A1C): Hemoglobin A1C: 7 % — AB (ref 4.0–5.6)

## 2018-11-02 MED ORDER — CLOTRIMAZOLE-BETAMETHASONE 1-0.05 % EX CREA: 1.0000 "application " | TOPICAL_CREAM | Freq: Two times a day (BID) | CUTANEOUS | 2 refills | Status: DC

## 2018-11-02 NOTE — Patient Instructions (Addendum)
Your blood pressure is high today.  Please see your primary care provider soon, to have it rechecked. I have sent a prescription to your pharmacy, for the rash on your feet.  Please take these pump settings: basal rate of 4.0 units/hr 6 AM-10 PM, and 2.4 units/hr 10 PM-6 AM mealtime bolus of 5 units with each meal.   correction bolus (which some people call "sensitivity," or "insulin sensitivity ratio," or just "isr") of 1 unit for each 50 by which your glucose exceeds 100.  suspend the pump for 1-2 hrs, with activity.  If not, eat a light snack with it.   On this type of insulin schedule, you should eat meals on a regular schedule.  If a meal is missed or significantly delayed, your blood sugar could go low.   Please come back for a follow-up appointment in 3 months.

## 2018-11-02 NOTE — Progress Notes (Signed)
Subjective:    Patient ID: Kenneth Pace, male    DOB: 01/03/1951, 68 y.o.   MRN: [id number]  HPI Pt returns for f/u of diabetes mellitus:  DM type: 1 Dx'ed: [id number] Complications: retinopathy and polyneuropathy.   Therapy: insulin since dx.  DKA: only at dx.   Severe hypoglycemia: most recently in 2014.  Pancreatitis: never Other: he has a medtronic 630 pump and continuous glucose monitor; he is retired. continuous glucose monitor ID is [email or web address] and password is [email or web address].  Interval history:  basal rate of 4.4 units/hr 6 AM-10 PM, and 2.6 units/hr 10 PM-6 AM mealtime bolus of 5 units with each meal.   correction bolus (which some people call "sensitivity," or "insulin sensitivity ratio," or just "isr") of 1 unit for each 50 by which glucose exceeds 100.  suspends the pump for 1-2 hrs, with activity.  TDD is 95 units (95% of which is basal).   I reviewed continuous glucose monitor data.  Glucose varies from 60-400, but most are in the 100's.  It is in general highest at HS, and lowest in the afternoon.  However, there is very little trend throughout the day.   Past Medical History:  Diagnosis Date  . ADENOIDECTOMY, HX OF 02/16/2007  . Anxiety    pt. states he does not have anxiety.  Marland Kitchen BENIGN PROSTATIC HYPERTROPHY, WITH OBSTRUCTION 02/03/2010  . DIABETES MELLITUS, TYPE I, UNCONTROLLED 12/29/2006  . ERECTILE DYSFUNCTION 02/16/2007  . HYPERLIPIDEMIA 02/16/2007  . HYPERTENSION 02/16/2007  . Sleep apnea    uses CPAP  . TESTICULAR MASS, LEFT 02/16/2007    Past Surgical History:  Procedure Laterality Date  . ANTERIOR CERVICAL DECOMP/DISCECTOMY FUSION N/A 02/01/2017   Procedure: ANTERIOR CERVICAL DECOMPRESSION/DISCECTOMY FUSION CERVICAL THREE-FOUR , CERVICAL FOUR-FIVE  CERVICAL FIVE-SIX;  Surgeon: Consuella Lose, MD;  Location: [facility];  Service: Neurosurgery;  Laterality: N/A;  . COLONOSCOPY  02/05/2010   [facility]  . KNEE SURGERY     2 Lknee arthroscopy, 3 R knee  arthroscpoy  . KNEE SURGERY Right 11/12/2016  . TONSILLECTOMY      Social History   Socioeconomic History  . Marital status: Married    Spouse name: Not on file  . Number of children: Not on file  . Years of education: Not on file  . Highest education level: Not on file  Occupational History  . 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 Financial resource strain: Not on file  . Food insecurity    Worry: Not on file    Inability: Not on file  . Transportation needs    Medical: Not on file    Non-medical: Not on file  Tobacco Use  . Smoking status: Never Smoker  . Smokeless tobacco: Never Used  Substance and Sexual Activity  . Alcohol use: No  . Drug use: No  . Sexual activity: Not on file  Lifestyle  . Physical activity    Days per week: Not on file    Minutes per session: Not on file  . Stress: Not on file  Relationships  . Social Herbalist on phone: Not on file    Gets together: Not on file    Attends religious service: Not on file    Active member of club or organization: Not on file    Attends meetings of clubs or organizations: Not on file    Relationship status: Not on file  .  Intimate partner violence    Fear of current or ex partner: Not on file    Emotionally abused: Not on file    Physically abused: Not on file    Forced sexual activity: Not on file  Other Topics Concern  . Not on file  Social History Narrative   He works for [employer]   He lives at home with wife.     Highest level of education:  BS, business admin    Current Outpatient Medications on File Prior to Visit  Medication Sig Dispense Refill  . acetaminophen (TYLENOL) 325 MG tablet Take 2 tablets (650 mg total) by mouth every 6 (six) hours as needed for mild pain or headache.    Marland Kitchen amitriptyline (ELAVIL) 75 MG tablet TAKE 1 TABLET(75 MG) BY MOUTH AT BEDTIME 30 tablet 2  . B Complex Vitamins  (B-COMPLEX/B-12 PO) Take by mouth.    . baclofen (LIORESAL) 10 MG tablet Take 2 tablets (20 mg total) by mouth 3 (three) times daily. 360 each 3  . celecoxib (CELEBREX) 100 MG capsule TAKE 1 CAPSULE(100 MG) BY MOUTH AT BEDTIME 30 capsule 2  . Coenzyme Q10 (COQ10) 200 MG CAPS Take by mouth.    . Cranberry 400 MG TABS Take 2 each by mouth daily.      . Cyanocobalamin (VITAMIN B-12 CR PO) Take 1 each by mouth daily.      . DULoxetine (CYMBALTA) 60 MG capsule TAKE 1 CAPSULE DAILY 90 capsule 4  . finasteride (PROSCAR) 5 MG tablet Take 5 mg by mouth daily.     Marland Kitchen glucagon 1 MG injection Inject 1 mg into the vein once as needed. 1 each 12  . glucose blood (BAYER CONTOUR NEXT TEST) test strip MEDICALLY NECESSARY FOR USE WITH PUMP; Use to check blood sugar 5 times per day and prn; E11.42 500 each 3  . Insulin Infusion Pump Supplies (PARADIGM RESERVOIR 3ML) MISC 1 Device by Does not apply route every 3 (three) days. 30 each 3  . Insulin Infusion Pump Supplies (QUICK-SET INFUSION 43" 9MM) MISC 1 Device by Does not apply route every 3 (three) days. 30 each 3  . insulin lispro (HUMALOG) 100 UNIT/ML injection INJECT 120 UNITS DAILY IN PUMP 120 mL 3  . lisinopril-hydrochlorothiazide (ZESTORETIC) 10-12.5 MG tablet TAKE 1 TABLET DAILY, PLEASE SEE PRIMARY CARE PHYSICIAN FOR ANY FUTURE REFILLS 90 tablet 2  . Multiple Vitamin (MULTIVITAMIN) capsule Take by mouth.    . Multiple Vitamins-Minerals (MULTIVITAMIN,TX-MINERALS) tablet Take 1 tablet by mouth daily.      Marland Kitchen scopolamine (TRANSDERM-SCOP) 1 MG/3DAYS Place 1 patch (1.5 mg total) onto the skin every 3 (three) days. 10 patch 12   No current facility-administered medications on file prior to visit.     No Known Allergies  Family History  Problem Relation Age of Onset  . Dementia Mother   . Diabetes Mellitus I Mother   . Hypertension Father        72  . Healthy Sister   . Rheum arthritis Brother   . Cancer Neg Hx   . Colon cancer Neg Hx   . Esophageal  cancer Neg Hx   . Stomach cancer Neg Hx   . Rectal cancer Neg Hx   . Colon polyps Neg Hx     BP (!) 146/76 (BP Location: Left Arm, Patient Position: Sitting, Cuff Size: Large)   Pulse 80   Ht 6' (1.829 m)   Wt 236 lb 3.2 oz (107.1  kg)   SpO2 96%   BMI 32.03 kg/m    Review of Systems Denies LOC    Objective:   Physical Exam VITAL SIGNS:  See vs page GENERAL: no distress Pulses: dorsalis pedis intact bilat.   MSK: no deformity of the feet CV: 1+ bilat leg edema Skin:  no ulcer on the feet.  normal color and temp on the feet.  Moderate patchy red rash on the feet.  Neuro: sensation is intact to touch on the feet.   Ext: there is bilateral onychomycosis of the toenails.    Lab Results  Component Value Date   HGBA1C 7.0 (A) 11/02/2018       Assessment & Plan:  Type 1 DM, with DR: well-controlled Hypoglycemia: this limits aggressiveness of glycemic control. HTN: is noted today Foot rash, new, uncertain etiology.   Patient Instructions  Your blood pressure is high today.  Please see your primary care provider soon, to have it rechecked. I have sent a prescription to your pharmacy, for the rash on your feet.  Please take these pump settings: basal rate of 4.0 units/hr 6 AM-10 PM, and 2.4 units/hr 10 PM-6 AM mealtime bolus of 5 units with each meal.   correction bolus (which some people call "sensitivity," or "insulin sensitivity ratio," or just "isr") of 1 unit for each 50 by which your glucose exceeds 100.  suspend the pump for 1-2 hrs, with activity.  If not, eat a light snack with it.   On this type of insulin schedule, you should eat meals on a regular schedule.  If a meal is missed or significantly delayed, your blood sugar could go low.   Please come back for a follow-up appointment in 3 months.

## 2018-11-05 ENCOUNTER — Other Ambulatory Visit: Payer: Self-pay | Admitting: Family Medicine

## 2018-11-24 ENCOUNTER — Other Ambulatory Visit: Payer: Self-pay | Admitting: Physical Medicine & Rehabilitation

## 2018-11-25 DIAGNOSIS — G4733 Obstructive sleep apnea (adult) (pediatric): Secondary | ICD-10-CM | POA: Diagnosis not present

## 2018-12-02 ENCOUNTER — Other Ambulatory Visit (HOSPITAL_COMMUNITY): Payer: Self-pay

## 2018-12-05 ENCOUNTER — Inpatient Hospital Stay (HOSPITAL_COMMUNITY): Admission: RE | Admit: 2018-12-05 | Payer: BC Managed Care – PPO | Source: Ambulatory Visit

## 2018-12-05 ENCOUNTER — Other Ambulatory Visit: Payer: Self-pay

## 2018-12-05 ENCOUNTER — Ambulatory Visit (HOSPITAL_COMMUNITY)
Admission: RE | Admit: 2018-12-05 | Discharge: 2018-12-05 | Disposition: A | Discharge status: Home / Self Care | Payer: BC Managed Care – PPO | Source: Ambulatory Visit | Attending: Neurology | Admitting: Neurology

## 2018-12-05 DIAGNOSIS — G6181 Chronic inflammatory demyelinating polyneuritis: Secondary | ICD-10-CM | POA: Insufficient documentation

## 2018-12-05 MED ORDER — IMMUNE GLOBULIN (HUMAN) 10 GM/100ML IV SOLN
1.0000 g/kg | INTRAVENOUS | Status: DC
  Administered 2018-12-05: 105 g via INTRAVENOUS
  Filled 2018-12-05: qty 1000

## 2019-01-16 ENCOUNTER — Ambulatory Visit (HOSPITAL_COMMUNITY)
Admission: RE | Admit: 2019-01-16 | Discharge: 2019-01-16 | Disposition: A | Discharge status: Home / Self Care | Payer: BC Managed Care – PPO | Source: Ambulatory Visit | Attending: Neurology | Admitting: Neurology

## 2019-01-16 ENCOUNTER — Other Ambulatory Visit: Payer: Self-pay

## 2019-01-16 DIAGNOSIS — G6181 Chronic inflammatory demyelinating polyneuritis: Secondary | ICD-10-CM | POA: Insufficient documentation

## 2019-01-16 MED ORDER — IMMUNE GLOBULIN (HUMAN) 10 GM/100ML IV SOLN
1.0000 g/kg | INTRAVENOUS | Status: DC
  Administered 2019-01-16: 105 g via INTRAVENOUS
  Filled 2019-01-16: qty 1000

## 2019-01-17 ENCOUNTER — Inpatient Hospital Stay (HOSPITAL_COMMUNITY): Admission: RE | Admit: 2019-01-17 | Payer: BC Managed Care – PPO | Source: Ambulatory Visit

## 2019-01-23 ENCOUNTER — Other Ambulatory Visit: Payer: Self-pay

## 2019-01-23 ENCOUNTER — Telehealth (INDEPENDENT_AMBULATORY_CARE_PROVIDER_SITE_OTHER): Payer: BC Managed Care – PPO | Admitting: Neurology

## 2019-01-23 ENCOUNTER — Encounter: Payer: Self-pay | Admitting: Neurology

## 2019-01-23 VITALS — Ht 72.0 in | Wt 236.0 lb

## 2019-01-23 DIAGNOSIS — G959 Disease of spinal cord, unspecified: Secondary | ICD-10-CM | POA: Diagnosis not present

## 2019-01-23 DIAGNOSIS — R261 Paralytic gait: Secondary | ICD-10-CM

## 2019-01-23 DIAGNOSIS — G6181 Chronic inflammatory demyelinating polyneuritis: Secondary | ICD-10-CM

## 2019-01-23 NOTE — Progress Notes (Signed)
   Virtual Visit via Video Note The purpose of this virtual visit is to provide medical care while limiting exposure to the novel coronavirus.    Consent was obtained for video visit:  Yes.   Answered questions that patient had about telehealth interaction:  Yes.   I discussed the limitations, risks, security and privacy concerns of performing an evaluation and management service by telemedicine. I also discussed with the patient that there may be a patient responsible charge related to this service. The patient expressed understanding and agreed to proceed.  Pt location: Home Physician Location: office Name of referring provider:  Renato Shin, MD I connected with Kenneth Pace at patients initiation/request on 01/23/2019 at 10:30 AM EST by video enabled telemedicine application and verified that I am speaking with the correct person using two identifiers. Pt MRN:  [id number] Pt DOB:  1950-09-05 Video Participants:  Kenneth Pace;  wife   History of Present Illness: This is a 68 y.o. male  With history of cervical myelopathy s/p decompression and C3-C6 returning for follow-up of CIDP.  He has been tolerating IVIG every 6 weeks and noticed mild increase in paresthesias by the end of the 5th week.    He is having increased falls, which he attributes to stiffness in the legs.  When he falls, he is unable to break the fall and tends to just simply fall over.  He does not stumble.  He uses a cane as needed.  He admits to being less compliant with his home stretching regimen and is much more sedentary since he is working from home for the past 6 months.  He takes baclofen 20 mg 3 times daily.   Observations/Objective:   Vitals:   01/23/19 1027  Weight: 236 lb (107 kg)  Height: 6' (1.829 m)   Patient is awake, alert, and appears comfortable.  Oriented x 4.   Extraocular muscles are intact. No ptosis.  Face is symmetric.  Speech is not dysarthric.  Antigravity in all extremities.   No pronator drift. Gait is spastic, unassisted and somewhat unsteady.   Assessment and Plan:  1.  Chronic inflammatory demyelinating polyradiculoneuropathy, diagnosed October 2018.  -Continue IVIG 1 mg/kg every 6 weeks which has helped paresthesias  2.  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 The patient was provided an opportunity to ask questions and all were answered. The patient agreed with the plan and demonstrated an understanding of the instructions.   The patient was advised to call back or seek an in-person evaluation if the symptoms worsen or if the condition fails to improve as anticipated.  Follow-up in 4 months  Total time spent:  20 minutes     Alda Berthold, DO

## 2019-01-23 NOTE — Progress Notes (Signed)
[facility] [place]

## 2019-02-01 ENCOUNTER — Ambulatory Visit: Payer: BC Managed Care – PPO | Admitting: Rehabilitative and Restorative Service Providers"

## 2019-02-01 ENCOUNTER — Ambulatory Visit: Payer: BC Managed Care – PPO | Admitting: Endocrinology

## 2019-02-07 ENCOUNTER — Encounter: Payer: Self-pay | Admitting: Rehabilitative and Restorative Service Providers"

## 2019-02-07 ENCOUNTER — Ambulatory Visit (INDEPENDENT_AMBULATORY_CARE_PROVIDER_SITE_OTHER): Payer: BC Managed Care – PPO | Admitting: Rehabilitative and Restorative Service Providers"

## 2019-02-07 ENCOUNTER — Other Ambulatory Visit: Payer: Self-pay

## 2019-02-07 DIAGNOSIS — R2689 Other abnormalities of gait and mobility: Secondary | ICD-10-CM | POA: Diagnosis not present

## 2019-02-07 DIAGNOSIS — R2681 Unsteadiness on feet: Secondary | ICD-10-CM | POA: Diagnosis not present

## 2019-02-07 DIAGNOSIS — M6281 Muscle weakness (generalized): Secondary | ICD-10-CM | POA: Diagnosis not present

## 2019-02-07 NOTE — Therapy (Signed)
[facility] [place], [place], [address] Phone: [phone]   Fax:  [phone]  Physical Therapy Evaluation  Patient Details  Name: Kenneth Pace MRN: [id number] Date of Birth: Mar 11, 1950 Referring Provider (PT): Narda Amber, DO   Encounter Date: 02/07/2019  PT End of Session - 02/07/19 1555    Visit Number  1    Number of Visits  12    Date for PT Re-Evaluation  03/24/19    PT Start Time  [id number]    PT Stop Time  [id number]    PT Time Calculation (min)  47 min       Past Medical History:  Diagnosis Date  . ADENOIDECTOMY, HX OF 02/16/2007  . Anxiety    pt. states he does not have anxiety.  Marland Kitchen BENIGN PROSTATIC HYPERTROPHY, WITH OBSTRUCTION 02/03/2010  . DIABETES MELLITUS, TYPE I, UNCONTROLLED 12/29/2006  . ERECTILE DYSFUNCTION 02/16/2007  . HYPERLIPIDEMIA 02/16/2007  . HYPERTENSION 02/16/2007  . Sleep apnea    uses CPAP  . TESTICULAR MASS, LEFT 02/16/2007    Past Surgical History:  Procedure Laterality Date  . ANTERIOR CERVICAL DECOMP/DISCECTOMY FUSION N/A 02/01/2017   Procedure: ANTERIOR CERVICAL DECOMPRESSION/DISCECTOMY FUSION CERVICAL THREE-FOUR , CERVICAL FOUR-FIVE  CERVICAL FIVE-SIX;  Surgeon: Consuella Lose, MD;  Location: [facility];  Service: Neurosurgery;  Laterality: N/A;  . COLONOSCOPY  02/05/2010   [facility]  . KNEE SURGERY     2 Lknee arthroscopy, 3 R knee arthroscpoy  . KNEE SURGERY Right 11/12/2016  . TONSILLECTOMY      There were no vitals filed for this visit.   Subjective Assessment - 02/07/19 1353    Subjective  The patient has neurologic h/o Jossie Ng and cervical myelopathy in 2018.  He underwent IP rehab in 2018 and has had some OP therapy.  He reports his current concern is worsening balance.  He knows he is falling, but is unable to recover from a loss of balance.  He uses assistive devices intermittently.    Pertinent History  Guillian Barre, Cervical fusion due to  cervical myelopathy (end of 2018), R knee surgery    Patient Stated Goals  improve flexibility, improve balance (stiffness in legs)    Currently in Pain?  Yes    Pain Location  Back    Pain Orientation  Right;Left    Pain Descriptors / Indicators  Aching    Pain Type  Chronic pain    Pain Onset  More than a month ago    Pain Frequency  Constant         [facility] PT Assessment - 02/07/19 1347      Assessment   Medical Diagnosis  CIDP    Referring Provider (PT)  Narda Amber, DO    Onset Date/Surgical Date  --   2018   Prior Therapy  known to our clinic from prior physical therapy      Precautions   Precautions  Fall      Restrictions   Weight Bearing Restrictions  No      Balance Screen   Has the patient fallen in the past 6 months  Yes    How many times?  6    Has the patient had a decrease in activity level because of a fear of falling?   Yes    Is the patient reluctant to leave their home because of a fear of falling?   No      Home Environment   Living Environment  Private residence    Living Arrangements  Spouse/significant other    Available Help at Discharge  Family    Type of [place] to enter    Entrance Stairs-Number of Steps  2    Entrance Stairs-Rails  None    Home Layout  Two level;Bed/bath upstairs    Alternate Level Stairs-Number of Steps  17    Alternate Level Stairs-Rails  Can reach both;Right;Left    [place] - single point;Walker - 2 wheels;Walker - 4 wheels    Additional Comments  No cane use indoors, uses walls/furniture for balance.       Prior Function   Level of Independence  Independent with household mobility without device    Vocation  Full time employment    Vocation Requirements  sits for long periods    Leisure  yard work, playing with Environmental consultant  Impaired Detail   some numbness in plantar aspect of feet   Additional Comments  some pins and needles in hands worse when time for  IVIG      Posture/Postural Control   Posture/Postural Control  --      ROM / Strength   AROM / PROM / Strength  AROM;Strength      AROM   Overall AROM   Deficits    Overall AROM Comments  tightness in shoulders:  patient can reach behind his head, but not touch his upper back;       Strength   Overall Strength  Deficits    Overall Strength Comments  bilateral shoulder flexion 5/5, shoulder abduction 5/5, elbow flexion/extension 5/5, grip equal and symmetric.  R hip flexion 3+/5, L hip flexion 4/5, bilateral knee flexion/extension 5/5, ankle DF is 5/5.        Flexibility   Soft Tissue Assessment /Muscle Length  yes   bilateral adductors tight   Hamstrings  -45 bilaterally begining 90/90 position (contralateral LE is straight)      Transfers   Transfers  Sit to Stand;Stand to Sit    Sit to Stand  6: Modified independent (Device/Increase time);Without upper extremity assist    Five time sit to stand comments   18.60 seconds    Stand to Sit  6: Modified independent (Device/Increase time)      Ambulation/Gait   Ambulation/Gait  Yes    Ambulation/Gait Assistance  6: Modified independent (Device/Increase time)    Ambulation Distance (Feet)  100 Feet    Assistive device  None    Ambulation Surface  Level;Indoor    Stairs  Yes    Stairs Assistance  6: Modified independent (Device/Increase time)    Stair Management Technique  Two rails;Alternating pattern    Number of Stairs  4      Standardized Balance Assessment   Standardized Balance Assessment  Berg Balance Test      Berg Balance Test   Sit to Stand  Able to stand without using hands and stabilize independently    Standing Unsupported  Able to stand safely 2 minutes    Sitting with Back Unsupported but Feet Supported on Floor or Stool  Able to sit safely and securely 2 minutes    Stand to Sit  Sits safely with minimal use of hands    Transfers  Able to transfer safely, minor use of hands    Standing Unsupported with Eyes  Closed  Able to stand  10 seconds safely    Standing Unsupported with Feet Together  Able to place feet together independently and stand 1 minute safely    From Standing, Reach Forward with Outstretched Arm  Can reach forward >12 cm safely (5")    From Standing Position, Pick up Object from [place] to pick up shoe safely and easily    From Standing Position, Turn to Look Behind Over each Shoulder  Looks behind from both sides and weight shifts well    Turn 360 Degrees  Able to turn 360 degrees safely but slowly    Standing Unsupported, Alternately Place Feet on Step/Stool  Able to complete 4 steps without aid or supervision    Standing Unsupported, One Foot in [place] to plae foot ahead of the other independently and hold 30 seconds    Standing on One Leg  Able to lift leg independently and hold 5-10 seconds    Total Score  49    Berg comment:  49/56                Objective measurements completed on examination: See above findings.      [facility] PT Treatment/Exercise - 02/07/19 1347      Neuro Re-ed    Neuro Re-ed Details   Emphasis for HEP on stepping strategies beginning with backwards walking and sidestep<>midline with emphasis on large step +increasing pace near support surface for safety      Exercises   Exercises  Knee/Hip      Knee/Hip Exercises: Stretches   Active Hamstring Stretch  2 reps;30 seconds    Active Hamstring Stretch Limitations  seated    Other Knee/Hip Stretches  standing hip adductor stretch  near countertop for support             PT Education - 02/07/19 1440    Education Details  HEP initiated    [employer]) Educated  Patient    Methods  Explanation;Demonstration;Handout    Comprehension  Verbalized understanding;Returned demonstration       PT Short Term Goals - 02/07/19 1445      PT SHORT TERM GOAL #1   Title  The patient will beindep wit        PT Long Term Goals - 02/07/19 1555      PT LONG TERM GOAL #1   Title   The patient will be indep with HEP.    Time  6    Period  Weeks    Target Date  03/24/19      PT LONG TERM GOAL #2   Title  The patient will improve Berg from 49/56 to > or equal to 52/56 to demo improving static balance.    Time  6    Period  Weeks    Target Date  03/24/19      PT LONG TERM GOAL #3   Title  The patient will improve 5 time sit<>stand from 18.60 seconds to < or equal to 14 seconds to demo improving mobility.    Time  6    Period  Weeks    Target Date  03/24/19      PT LONG TERM GOAL #4   Title  The patient will move floor<>stand with UE support due to h/o falls.    Time  6    Period  Weeks    Target Date  03/24/19      PT LONG TERM GOAL #5   Title  The patient will  have gait speed measured and improve 0.5 ft/sec (0.21m/s) to demo improving functional mobility.    Time  6    Period  Weeks    Target Date  03/24/19             Plan - 02/07/19 1558    Clinical Impression Statement  The patient is a 68 year old male presenting to OP physical therapy with h/o guillian barre and cervical myelopathy with impairments in muscle tone, motor timing, flexibility, balance, strength, and quality of movement.  Impairments lead to increased fall risk with 6+ falls in the past 6 months.  PT to address impairments to improve functional mobility and safety with daily activities.    Personal Factors and Comorbidities  Comorbidity 3+    Comorbidities  diabetes, h/o LBP, guillian barre, cervical myelopathy    Examination-Activity Limitations  Squat;Locomotion Level;Carry    Examination-Participation Restrictions  Community Activity;Yard Work;Shop    Stability/Clinical Decision Making  Evolving/Moderate complexity   due to falls   Clinical Decision Making  Moderate    Rehab Potential  Good    PT Frequency  2x / week    PT Duration  6 weeks    PT Treatment/Interventions  ADLs/Self Care Home Management;Neuromuscular re-education;Balance training;Gait training;Stair  training;Functional mobility training;Therapeutic activities;Therapeutic exercise;Moist Heat;Patient/family education;Manual techniques;Taping    PT Next Visit Plan  Assess gait speed, work on postural balance strategies (hip, ankle, and stepping), increasing speed of movement in standing for balance recovery, stretching/flexibility to reduce tone    Consulted and Agree with Plan of Care  Patient       Patient will benefit from skilled therapeutic intervention in order to improve the following deficits and impairments:  Abnormal gait, Difficulty walking, Decreased balance, Decreased strength, Decreased mobility, Impaired flexibility, Pain, Impaired tone, Decreased coordination  Visit Diagnosis: Muscle weakness (generalized)  Unsteadiness on feet  Other abnormalities of gait and mobility     Problem List Patient Active Problem List   Diagnosis Date Noted  . Myalgia 11/01/2018  . Chronic bilateral low back pain without sciatica 07/21/2018  . 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 CIDP (chronic inflammatory demyelinating polyneuropathy) ([place]) 12/30/2017  . Edema 03/01/2017  . Hypokalemia   . Slow transit constipation   . Steroid-induced hyperglycemia   . Urinary retention   . Accidental drug overdose   . Postoperative pain   . Hyponatremia   . Cervical myelopathy ([place]) 02/01/2017  . Shortness of breath at rest   . Hypoglycemia   . Spondylosis, cervical, with myelopathy   . Neuropathic pain   . Benign essential HTN   . Labile blood glucose   . Muscle spasm   . GAD (generalized anxiety disorder)   . Acute inflammatory demyelinating polyneuropathy ([place]) 01/11/2017  . Anxiety state   . Neurogenic bladder   . Depression 12/30/2016  . Leg weakness, bilateral 12/30/2016  . 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[place]) 12/30/2016  . GBS (Guillain Barre syndrome) ([facility])   . AIDP (acute inflammatory demyelinating polyneuropathy) ([facility] [place]) 11/27/2016  . Weakness  11/20/2016  . Weakness of both arms 10/30/2016  . 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 Obstructive sleep apnea 03/24/2016  . Numbness 03/18/2016  . Mild nonproliferative diabetic retinopathy of both eyes without macular edema associated with type 2 diabetes mellitus ([place]) 05/24/2015  . Nuclear cataract of both eyes 05/24/2015  . Diabetes mellitus without complication ([place]) [id number]  . 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 Eustachian tube dysfunction 02/12/2015  . Acute maxillary sinusitis  02/12/2015  . Wellness examination 08/22/2014  . Alkaline phosphatase elevation 08/22/2014  . 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 Benign non-nodular prostatic hyperplasia with lower urinary tract symptoms 01/31/2014  . Lesion of eyelid 09/26/2013  . Screening for prostate cancer 04/24/2013  . Diabetes ([place]) 06/16/2012  . ED (erectile dysfunction) of organic origin 06/03/2012  . Routine general medical examination at a health care facility 04/10/2012  . BPH (benign prostatic hyperplasia) 02/03/2010  . HEMOCCULT POSITIVE STOOL 05/25/2008  . ELBOW PAIN, RIGHT 07/04/2007  . Dyslipidemia 02/16/2007  . ANXIETY 02/16/2007  . ERECTILE DYSFUNCTION 02/16/2007  . Essential hypertension 02/16/2007  . TESTICULAR MASS, LEFT 02/16/2007  . KNEE PAIN, CHRONIC 02/16/2007  . Michell Heinrich OF 02/16/2007    [place], PT 02/07/2019, [address] [place], [place], [address] Phone: [phone]   Fax:  [phone]  Name: Kenneth Pace MRN: [id number] Date of Birth: December 29, 1950

## 2019-02-07 NOTE — Patient Instructions (Signed)
Access Code: [email or web address]  URL: [email or web address]  Date: 02/07/2019  Prepared by: Rudell Cobb   Exercises Seated Hamstring Stretch - 3 reps - 1 sets - 30 seconds hold - 2x daily - 7x weekly Side Lunge Adductor Stretch - 3 reps - 1 sets - 30 seconds hold - 2x daily - 7x weekly Backward Walking with Counter Support - 10 reps - 1 sets - 2x daily - 7x weekly Sidestepping - 10 reps - 1 sets - 2x daily - 7x weekly

## 2019-02-13 ENCOUNTER — Ambulatory Visit (INDEPENDENT_AMBULATORY_CARE_PROVIDER_SITE_OTHER): Payer: BC Managed Care – PPO | Admitting: Physical Therapy

## 2019-02-13 ENCOUNTER — Other Ambulatory Visit: Payer: Self-pay

## 2019-02-13 DIAGNOSIS — R2689 Other abnormalities of gait and mobility: Secondary | ICD-10-CM | POA: Diagnosis not present

## 2019-02-13 DIAGNOSIS — R2681 Unsteadiness on feet: Secondary | ICD-10-CM

## 2019-02-13 DIAGNOSIS — M6281 Muscle weakness (generalized): Secondary | ICD-10-CM

## 2019-02-13 NOTE — Therapy (Signed)
[place], [place], [address] Phone: [phone]   Fax:  [phone]  Physical Therapy Treatment  Patient Details  Name: Kenneth Pace MRN: [id number] Date of Birth: [birth year] Referring Provider (PT): Narda Amber, DO   Encounter Date: 02/13/2019  PT End of Session - 02/13/19 1602    Visit Number  2    Number of Visits  12    Date for PT Re-Evaluation  03/24/19    PT Start Time  1518    PT Stop Time  1555    PT Time Calculation (min)  37 min       Past Medical History:  Diagnosis Date  . ADENOIDECTOMY, HX OF 02/16/2007  . Anxiety    pt. states he does not have anxiety.  Marland Kitchen BENIGN PROSTATIC HYPERTROPHY, WITH OBSTRUCTION 02/03/2010  . DIABETES MELLITUS, TYPE I, UNCONTROLLED 12/29/2006  . ERECTILE DYSFUNCTION 02/16/2007  . HYPERLIPIDEMIA 02/16/2007  . HYPERTENSION 02/16/2007  . Sleep apnea    uses CPAP  . TESTICULAR MASS, LEFT 02/16/2007    Past Surgical History:  Procedure Laterality Date  . ANTERIOR CERVICAL DECOMP/DISCECTOMY FUSION N/A 02/01/2017   Procedure: ANTERIOR CERVICAL DECOMPRESSION/DISCECTOMY FUSION CERVICAL THREE-FOUR , CERVICAL FOUR-FIVE  CERVICAL FIVE-SIX;  Surgeon: Consuella Lose, MD;  Location: [facility];  Service: Neurosurgery;  Laterality: N/A;  . COLONOSCOPY  02/05/2010   [facility]  . KNEE SURGERY     2 Lknee arthroscopy, 3 R knee arthroscpoy  . KNEE SURGERY Right 11/12/2016  . TONSILLECTOMY      There were no vitals filed for this visit.  Subjective Assessment - 02/13/19 1714    Subjective  Pt reports no falls, nor any new changes since last visit.    Patient Stated Goals  improve flexibility, improve balance (stiffness in legs)    Currently in Pain?  No/denies         [facility] PT Assessment - 02/13/19 0001      Assessment   Medical Diagnosis  CIDP    Referring Provider (PT)  Narda Amber, DO    Onset Date/Surgical Date  --   2018   Hand Dominance   Right    Prior Therapy  known to our clinic from prior physical therapy       [facility] Adult PT Treatment/Exercise - 02/13/19 0001      Transfers   Sit to Stand  6: Modified independent (Device/Increase time);Without upper extremity assist    Five time sit to stand comments   14.84 sec      Knee/Hip Exercises: Stretches   Passive Hamstring Stretch  Right;Left;2 reps;20 seconds   seated   Gastroc Stretch  Right;Left;2 reps;20 seconds   heel of of step   Other Knee/Hip Stretches  standing hip adductor stretch  near countertop for support      Knee/Hip Exercises: Aerobic   Nustep  L5: 43min       Knee/Hip Exercises: Seated   Other Seated Knee/Hip Exercises  power twist with open arms then clapping each side x 5 .     Sit to Sand  2 sets;5 reps;without UE support      Balance Exercises - 02/13/19 1707      Balance Exercises: Standing   Wall Bumps  Eyes opened;Hip;Shoulder;10 reps   10 each, shoulder with ankle strategy   Stepping Strategy  Posterior;Lateral;10 reps   metronome 66-69 bpm   Retro Gait  2 reps    Sidestepping  3 reps;Upper extremity support  Other Standing Exercises  toe taps to 6" step without UE support x 10          PT Short Term Goals - 02/07/19 1445      PT SHORT TERM GOAL #1   Title  The patient will beindep wit        PT Long Term Goals - 02/07/19 1555      PT LONG TERM GOAL #1   Title  The patient will be indep with HEP.    Time  6    Period  Weeks    Target Date  03/24/19      PT LONG TERM GOAL #2   Title  The patient will improve Berg from 49/56 to > or equal to 52/56 to demo improving static balance.    Time  6    Period  Weeks    Target Date  03/24/19      PT LONG TERM GOAL #3   Title  The patient will improve 5 time sit<>stand from 18.60 seconds to < or equal to 14 seconds to demo improving mobility.    Time  6    Period  Weeks    Target Date  03/24/19      PT LONG TERM GOAL #4   Title  The patient will move floor<>stand with  UE support due to h/o falls.    Time  6    Period  Weeks    Target Date  03/24/19      PT LONG TERM GOAL #5   Title  The patient will have gait speed measured and improve 0.5 ft/sec (0.54m/s) to demo improving functional mobility.    Time  6    Period  Weeks    Target Date  03/24/19            Plan - 02/13/19 1704    Clinical Impression Statement  Pt scored 14.8 seconds on 5x sit to stand, near meeting his goal.  He did well with stepping strategy to metronome at 66-69 bpm, with occasional UE support on counter and CGA for safety.  No overt loss of balance with exercises.  With fatigue, pt demonstrates increased difficulty with Rt hip flexion.   Progressing towards goals.    Personal Factors and Comorbidities  Comorbidity 3+    Comorbidities  diabetes, h/o LBP, guillian barre, cervical myelopathy    Examination-Activity Limitations  Squat;Locomotion Level;Carry    Examination-Participation Restrictions  Community Activity;Yard Work;Shop    Stability/Clinical Decision Making  Evolving/Moderate complexity   due to falls   Rehab Potential  Good    PT Frequency  2x / week    PT Duration  6 weeks    PT Treatment/Interventions  ADLs/Self Care Home Management;Neuromuscular re-education;Balance training;Gait training;Stair training;Functional mobility training;Therapeutic activities;Therapeutic exercise;Moist Heat;Patient/family education;Manual techniques;Taping    PT Next Visit Plan  Assess gait speed, work on postural balance strategies (hip, ankle, and stepping), increasing speed of movement in standing for balance recovery, stretching/flexibility to reduce tone    Consulted and Agree with Plan of Care  Patient       Patient will benefit from skilled therapeutic intervention in order to improve the following deficits and impairments:  Abnormal gait, Difficulty walking, Decreased balance, Decreased strength, Decreased mobility, Impaired flexibility, Pain, Impaired tone, Decreased  coordination  Visit Diagnosis: Muscle weakness (generalized)  Unsteadiness on feet  Other abnormalities of gait and mobility     Problem List Patient Active Problem List   Diagnosis Date Noted  .  Myalgia 11/01/2018  . Chronic bilateral low back pain without sciatica 07/21/2018  . 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 CIDP (chronic inflammatory demyelinating polyneuropathy) ([place]) 12/30/2017  . Edema 03/01/2017  . Hypokalemia   . Slow transit constipation   . Steroid-induced hyperglycemia   . Urinary retention   . Accidental drug overdose   . Postoperative pain   . Hyponatremia   . Cervical myelopathy ([place]) 02/01/2017  . Shortness of breath at rest   . Hypoglycemia   . Spondylosis, cervical, with myelopathy   . Neuropathic pain   . Benign essential HTN   . Labile blood glucose   . Muscle spasm   . GAD (generalized anxiety disorder)   . Acute inflammatory demyelinating polyneuropathy ([place]) 01/11/2017  . Anxiety state   . Neurogenic bladder   . Depression 12/30/2016  . Leg weakness, bilateral 12/30/2016  . 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 GBS (Guillain Barre syndrome) ([place])   . AIDP (acute inflammatory demyelinating polyneuropathy) ([place]) 11/27/2016  . Weakness 11/20/2016  . Weakness of both arms 10/30/2016  . 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 Obstructive sleep apnea 03/24/2016  . Numbness 03/18/2016  . Mild nonproliferative diabetic retinopathy of both eyes without macular edema associated with type 2 diabetes mellitus ([place]) 05/24/2015  . Nuclear cataract of both eyes 05/24/2015  . Diabetes mellitus without complication ([place]) [id number]  . 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 Eustachian tube dysfunction 02/12/2015  . Acute maxillary sinusitis 02/12/2015  . Wellness examination 08/22/2014  . Alkaline phosphatase elevation 08/22/2014  . 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 Benign  non-nodular prostatic hyperplasia with lower urinary tract symptoms 01/31/2014  . Lesion of eyelid 09/26/2013  . Screening for prostate cancer 04/24/2013  . Diabetes ([place]) 06/16/2012  . ED (erectile dysfunction) of organic origin 06/03/2012  . Routine general medical examination at a health care facility 04/10/2012  . BPH (benign prostatic hyperplasia) 02/03/2010  . HEMOCCULT POSITIVE STOOL 05/25/2008  . ELBOW PAIN, RIGHT 07/04/2007  . Dyslipidemia 02/16/2007  . ANXIETY 02/16/2007  . ERECTILE DYSFUNCTION 02/16/2007  . Essential hypertension 02/16/2007  . TESTICULAR MASS, LEFT 02/16/2007  . KNEE PAIN, CHRONIC 02/16/2007  . Michell Heinrich OF 02/16/2007   Kerin Perna, PTA 02/13/19 5:16 PM  [place], [place], [address] Phone: [phone]   Fax:  [phone]  Name: Kenneth Pace MRN: [id number] Date of Birth: 1950-09-21

## 2019-02-16 ENCOUNTER — Encounter: Payer: Self-pay | Admitting: Rehabilitative and Restorative Service Providers"

## 2019-02-16 ENCOUNTER — Ambulatory Visit (INDEPENDENT_AMBULATORY_CARE_PROVIDER_SITE_OTHER): Payer: BC Managed Care – PPO | Admitting: Rehabilitative and Restorative Service Providers"

## 2019-02-16 ENCOUNTER — Other Ambulatory Visit: Payer: Self-pay

## 2019-02-16 DIAGNOSIS — R2681 Unsteadiness on feet: Secondary | ICD-10-CM | POA: Diagnosis not present

## 2019-02-16 DIAGNOSIS — M6281 Muscle weakness (generalized): Secondary | ICD-10-CM | POA: Diagnosis not present

## 2019-02-16 DIAGNOSIS — R2689 Other abnormalities of gait and mobility: Secondary | ICD-10-CM | POA: Diagnosis not present

## 2019-02-16 DIAGNOSIS — G610 Guillain-Barre syndrome: Secondary | ICD-10-CM | POA: Diagnosis not present

## 2019-02-16 NOTE — Therapy (Signed)
[place], [place], [address] Phone: [phone]   Fax:  [phone]  Physical Therapy Treatment  Patient Details  Name: Kenneth Pace MRN: [id number] Date of Birth: Feb 04, 1951 Referring Provider (PT): Narda Amber, DO   Encounter Date: 02/16/2019  PT End of Session - 02/16/19 0844    Visit Number  3    Number of Visits  12    Date for PT Re-Evaluation  03/24/19    PT Start Time  0800    PT Stop Time  0845    PT Time Calculation (min)  45 min       Past Medical History:  Diagnosis Date  . ADENOIDECTOMY, HX OF 02/16/2007  . Anxiety    pt. states he does not have anxiety.  Marland Kitchen BENIGN PROSTATIC HYPERTROPHY, WITH OBSTRUCTION 02/03/2010  . DIABETES MELLITUS, TYPE I, UNCONTROLLED 12/29/2006  . ERECTILE DYSFUNCTION 02/16/2007  . HYPERLIPIDEMIA 02/16/2007  . HYPERTENSION 02/16/2007  . Sleep apnea    uses CPAP  . TESTICULAR MASS, LEFT 02/16/2007    Past Surgical History:  Procedure Laterality Date  . ANTERIOR CERVICAL DECOMP/DISCECTOMY FUSION N/A 02/01/2017   Procedure: ANTERIOR CERVICAL DECOMPRESSION/DISCECTOMY FUSION CERVICAL THREE-FOUR , CERVICAL FOUR-FIVE  CERVICAL FIVE-SIX;  Surgeon: Consuella Lose, MD;  Location: [facility];  Service: Neurosurgery;  Laterality: N/A;  . COLONOSCOPY  02/05/2010   [facility]  . KNEE SURGERY     2 Lknee arthroscopy, 3 R knee arthroscpoy  . KNEE SURGERY Right 11/12/2016  . TONSILLECTOMY      There were no vitals filed for this visit.  Subjective Assessment - 02/16/19 0801    Subjective  The patient notes some soreness after last session.    Pertinent History  Guillian Barre, Cervical fusion due to cervical myelopathy (end of 2018), R knee surgery    Patient Stated Goals  improve flexibility, improve balance (stiffness in legs)    Currently in Pain?  Yes    Pain Location  Generalized   in legs   Pain Descriptors / Indicators  Sore    Pain Type   Chronic pain    Pain Onset  More than a month ago    Pain Frequency  Constant    Aggravating Factors   exercises    Pain Relieving Factors  movement                       [facility] Adult PT Treatment/Exercise - 02/16/19 0804      Ambulation/Gait   Ambulation/Gait  Yes    Ambulation/Gait Assistance  6: Modified independent (Device/Increase time)    Ambulation Distance (Feet)  200 Feet    Assistive device  None    Ambulation Surface  Level;Indoor    Gait velocity  2.5 ft/sec    Gait Comments  tactile cues for shoulder/trunk rotation to facilitate counter rotation of the hips in the transverse plane.        Neuro Re-ed    Neuro Re-ed Details   Standing anterior/posterior weight shifting with feet in stride position reaching with opposite UE; lateral reaching R and L x 10 reps each., high marches requiring min A for balance with R trunk lean and then reduced to just CGA after doing q-ped stretching, quadriped anternating hip and arm extension, tall kneel to half knee initially holding for stretch and then increasing speed.   Standing hip strategy positioned on 1/2 foam roll working on aligning to midline with use of  hip strategies and using trunk elongation/stability with min A.  Stepping strategies with cones for targets to alternate R/L in lateral/anterior/posterior and diagonal directions with min A for occasional loss of balance.      Exercises   Exercises  Other Exercises    Other Exercises   Standing modified down dog for posterior leg stretching x 3 reps, standing groin stretch, quadriped rocking ant/posterior, quadriped trunk flexion/extension x 5 reps within tolerable ROM, quadriped R and L trunk rotation.               PT Short Term Goals - 02/16/19 1400      PT SHORT TERM GOAL #1   Title  n/a        PT Long Term Goals - 02/07/19 1555      PT LONG TERM GOAL #1   Title  The patient will be indep with HEP.    Time  6    Period  Weeks    Target Date   03/24/19      PT LONG TERM GOAL #2   Title  The patient will improve Berg from 49/56 to > or equal to 52/56 to demo improving static balance.    Time  6    Period  Weeks    Target Date  03/24/19      PT LONG TERM GOAL #3   Title  The patient will improve 5 time sit<>stand from 18.60 seconds to < or equal to 14 seconds to demo improving mobility.    Time  6    Period  Weeks    Target Date  03/24/19      PT LONG TERM GOAL #4   Title  The patient will move floor<>stand with UE support due to h/o falls.    Time  6    Period  Weeks    Target Date  03/24/19      PT LONG TERM GOAL #5   Title  The patient will have gait speed measured and improve 0.5 ft/sec (0.55m/s) to demo improving functional mobility.    Time  6    Period  Weeks    Target Date  03/24/19            Plan - 02/16/19 0843    Clinical Impression Statement  The patient continues with increased muscle tone worse during standing/dynamic balance activities that limits speed of movement in response to losses of balance.  PT continuing to progress balance strategies and focus on stretching for tone reduction.  Plan to continue working to [place].    Examination-Participation Restrictions  Community Activity;Yard Work;Shop    Stability/Clinical Decision Making  --   due to falls   Rehab Potential  Good    PT Frequency  2x / week    PT Duration  6 weeks    PT Treatment/Interventions  ADLs/Self Care Home Management;Neuromuscular re-education;Balance training;Gait training;Stair training;Functional mobility training;Therapeutic activities;Therapeutic exercise;Moist Heat;Patient/family education;Manual techniques;Taping    PT Next Visit Plan  Add further HEP working to 10 min stretching program (quadriped trunk flexion/extension, quadriped rotation, tall knee to heel sitting *make sure he can get up and down from the floor with support); Treadmill at slow pace with guarding for safety to trial, tall kneel to 1/2 kneeling with  stretching and weight shift to reduce LE tone.    Consulted and Agree with Plan of Care  Patient       Patient will benefit from skilled therapeutic intervention in order to improve  the following deficits and impairments:  Abnormal gait, Difficulty walking, Decreased balance, Decreased strength, Decreased mobility, Impaired flexibility, Pain, Impaired tone, Decreased coordination  Visit Diagnosis: Muscle weakness (generalized)  Unsteadiness on feet  Other abnormalities of gait and mobility  Acute inflammatory demyelinating polyneuropathy [facility])     Problem List Patient Active Problem List   Diagnosis Date Noted  . Myalgia 11/01/2018  . Chronic bilateral low back pain without sciatica 07/21/2018  . 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 Edema 03/01/2017  . Hypokalemia   . Slow transit constipation   . Steroid-induced hyperglycemia   . Urinary retention   . Accidental drug overdose   . Postoperative pain   . Hyponatremia   . Cervical myelopathy ([place]) 02/01/2017  . Shortness of breath at rest   . Hypoglycemia   . Spondylosis, cervical, with myelopathy   . Neuropathic pain   . Benign essential HTN   . Labile blood glucose   . Muscle spasm   . GAD (generalized anxiety disorder)   . Acute inflammatory demyelinating polyneuropathy ([place]) 01/11/2017  . Anxiety state   . Neurogenic bladder   . Depression 12/30/2016  . Leg weakness, bilateral 12/30/2016  . 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 GBS (Guillain Barre syndrome) ([place])   . AIDP (acute inflammatory demyelinating polyneuropathy) ([place]) 11/27/2016  . Weakness 11/20/2016  . Weakness of both arms 10/30/2016  . 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 Obstructive sleep apnea 03/24/2016  . Numbness 03/18/2016  . Mild nonproliferative diabetic retinopathy of both eyes without macular edema  associated with type 2 diabetes mellitus ([place]) 05/24/2015  . Nuclear cataract of both eyes 05/24/2015  . Diabetes mellitus without complication ([place]) [id number]  . 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 Eustachian tube dysfunction 02/12/2015  . Acute maxillary sinusitis 02/12/2015  . Wellness examination 08/22/2014  . Alkaline phosphatase elevation 08/22/2014  . 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 Benign non-nodular prostatic hyperplasia with lower urinary tract symptoms 01/31/2014  . Lesion of eyelid 09/26/2013  . Screening for prostate cancer 04/24/2013  . Diabetes ([place]) 06/16/2012  . ED (erectile dysfunction) of organic origin 06/03/2012  . Routine general medical examination at a health care facility 04/10/2012  . BPH (benign prostatic hyperplasia) 02/03/2010  . HEMOCCULT POSITIVE STOOL 05/25/2008  . ELBOW PAIN, RIGHT 07/04/2007  . Dyslipidemia 02/16/2007  . ANXIETY 02/16/2007  . ERECTILE DYSFUNCTION 02/16/2007  . Essential hypertension 02/16/2007  . TESTICULAR MASS, LEFT 02/16/2007  . KNEE PAIN, CHRONIC 02/16/2007  . Michell Heinrich OF 02/16/2007    [place], PT 02/16/2019, 2:10 PM  [facility] [place], [place], [address] Phone: [phone]   Fax:  [phone]  Name: WICK MOSELY MRN: [id number] Date of Birth: 1950-06-15

## 2019-02-20 ENCOUNTER — Ambulatory Visit (INDEPENDENT_AMBULATORY_CARE_PROVIDER_SITE_OTHER): Payer: BC Managed Care – PPO | Admitting: Physical Therapy

## 2019-02-20 ENCOUNTER — Other Ambulatory Visit: Payer: Self-pay

## 2019-02-20 ENCOUNTER — Encounter: Payer: Self-pay | Admitting: Physical Therapy

## 2019-02-20 DIAGNOSIS — R2681 Unsteadiness on feet: Secondary | ICD-10-CM

## 2019-02-20 DIAGNOSIS — M6281 Muscle weakness (generalized): Secondary | ICD-10-CM | POA: Diagnosis not present

## 2019-02-20 DIAGNOSIS — R2689 Other abnormalities of gait and mobility: Secondary | ICD-10-CM

## 2019-02-20 NOTE — Therapy (Signed)
[place], [place], [address] Phone: [phone]   Fax:  [phone]  Physical Therapy Treatment  Patient Details  Name: Kenneth Pace MRN: [id number] Date of Birth: 08-Apr-1950 Referring Provider (PT): Narda Amber, DO   Encounter Date: 02/20/2019  PT End of Session - 02/20/19 1508    Visit Number  4    Number of Visits  12    Date for PT Re-Evaluation  03/24/19    PT Start Time  1508    PT Stop Time  1555    PT Time Calculation (min)  47 min    Activity Tolerance  Patient tolerated treatment well    Behavior During Therapy  [facility] for tasks assessed/performed       Past Medical History:  Diagnosis Date  . ADENOIDECTOMY, HX OF 02/16/2007  . Anxiety    pt. states he does not have anxiety.  Marland Kitchen BENIGN PROSTATIC HYPERTROPHY, WITH OBSTRUCTION 02/03/2010  . DIABETES MELLITUS, TYPE I, UNCONTROLLED 12/29/2006  . ERECTILE DYSFUNCTION 02/16/2007  . HYPERLIPIDEMIA 02/16/2007  . HYPERTENSION 02/16/2007  . Sleep apnea    uses CPAP  . TESTICULAR MASS, LEFT 02/16/2007    Past Surgical History:  Procedure Laterality Date  . ANTERIOR CERVICAL DECOMP/DISCECTOMY FUSION N/A 02/01/2017   Procedure: ANTERIOR CERVICAL DECOMPRESSION/DISCECTOMY FUSION CERVICAL THREE-FOUR , CERVICAL FOUR-FIVE  CERVICAL FIVE-SIX;  Surgeon: Consuella Lose, MD;  Location: [facility];  Service: Neurosurgery;  Laterality: N/A;  . COLONOSCOPY  02/05/2010   [facility]  . KNEE SURGERY     2 Lknee arthroscopy, 3 R knee arthroscpoy  . KNEE SURGERY Right 11/12/2016  . TONSILLECTOMY      There were no vitals filed for this visit.  Subjective Assessment - 02/20/19 1508    Subjective  Pt reports he feels stiff from sitting for work today.  He finds that if he lays on a heating pad (on low back) helps him fall asleep and stay asleep.  No recent falls.    Pertinent History  Guillian Barre, Cervical fusion due to cervical myelopathy (end of  2018), R knee surgery    Patient Stated Goals  improve flexibility, improve balance (stiffness in legs)    Currently in Pain?  Yes    Pain Score  3     Pain Location  Back    Pain Type  Chronic pain    Aggravating Factors   long periods of bending over.    Pain Relieving Factors  heating pad         [facility] PT Assessment - 02/20/19 0001      Assessment   Medical Diagnosis  CIDP    Referring Provider (PT)  Narda Amber, DO    Onset Date/Surgical Date  --   2018   Hand Dominance  Right    Prior Therapy  known to our clinic from prior physical therapy       [facility] Adult PT Treatment/Exercise - 02/20/19 0001      Exercises   Other Exercises   tall kneeling to single leg kneeling with unilateral support on stool on table x 3 reps each side.   Trunk rotation with dowel on top of shoulders twisting R/L x 5 reps each side.       Lumbar Exercises: Stretches   Passive Hamstring Stretch  Right;Left;2 reps;20 seconds    Lower Trunk Rotation  4 reps;20 seconds   then 5 reps just rocking back and forth.   Hip Flexor Stretch  Right;Left;4 reps;20 seconds    Hip Flexor Stretch Limitations  completed in high kneeling x 2, and in standing with leg on 2nd step lunging forward with hands on rails.     Piriformis Stretch  Right;Left;2 reps;20 seconds    Gastroc Stretch  Right;Left;2 reps;10 seconds   heels off of step   Other Lumbar Stretch Exercise  thoracic rotation (open book) x 5 slow reps each side.     Other Lumbar Stretch Exercise  standing with hands on counter - leaning forward for trunk ext, then rocking back for hamstring stretch x 2 reps       Lumbar Exercises: Seated   Other Seated Lumbar Exercises  Power reach - leg outstreched to side and same arm overhead x 5 reps each side.       Lumbar Exercises: Quadruped   Other Quadruped Lumbar Exercises  quadruped rocking to sit on heels to modified cobra pose x 4 reps, holding 5-10 sec each direction       Knee/Hip Exercises: Aerobic    Nustep  L4: 6.5 min for warm up     Other Aerobic  laps in between exercises (80 ft) to reduce stiffness.                PT Short Term Goals - 02/16/19 1400      PT SHORT TERM GOAL #1   Title  n/a        PT Long Term Goals - 02/07/19 1555      PT LONG TERM GOAL #1   Title  The patient will be indep with HEP.    Time  6    Period  Weeks    Target Date  03/24/19      PT LONG TERM GOAL #2   Title  The patient will improve Berg from 49/56 to > or equal to 52/56 to demo improving static balance.    Time  6    Period  Weeks    Target Date  03/24/19      PT LONG TERM GOAL #3   Title  The patient will improve 5 time sit<>stand from 18.60 seconds to < or equal to 14 seconds to demo improving mobility.    Time  6    Period  Weeks    Target Date  03/24/19      PT LONG TERM GOAL #4   Title  The patient will move floor<>stand with UE support due to h/o falls.    Time  6    Period  Weeks    Target Date  03/24/19      PT LONG TERM GOAL #5   Title  The patient will have gait speed measured and improve 0.5 ft/sec (0.75m/s) to demo improving functional mobility.    Time  6    Period  Weeks    Target Date  03/24/19            Plan - 02/20/19 1727    Clinical Impression Statement  Pt demonstrated slight decrease in tone with walking after completing high kneeling hip flexor stretch.  Focus of session was on creating flexibility HEP to reduce tone when at home.  Pt reported slight increase in low back pain at end of session; required some cues to dial back his lumbar stretches.  Plan to continue working to achieve LTGs.    Examination-Participation Restrictions  Community Activity;Yard Work;Shop    Stability/Clinical Decision Making  --   due to falls  Rehab Potential  Good    PT Frequency  2x / week    PT Duration  6 weeks    PT Treatment/Interventions  ADLs/Self Care Home Management;Neuromuscular re-education;Balance training;Gait training;Stair training;Functional  mobility training;Therapeutic activities;Therapeutic exercise;Moist Heat;Patient/family education;Manual techniques;Taping    PT Next Visit Plan  Add quadruped rotation.  Treadmill at slow pace with guarding for safety to trial,.    Consulted and Agree with Plan of Care  Patient       Patient will benefit from skilled therapeutic intervention in order to improve the following deficits and impairments:  Abnormal gait, Difficulty walking, Decreased balance, Decreased strength, Decreased mobility, Impaired flexibility, Pain, Impaired tone, Decreased coordination  Visit Diagnosis: Muscle weakness (generalized)  Unsteadiness on feet  Other abnormalities of gait and mobility     Problem List Patient Active Problem List   Diagnosis Date Noted  . Myalgia 11/01/2018  . Chronic bilateral low back pain without sciatica 07/21/2018  . 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 CIDP (chronic inflammatory demyelinating polyneuropathy) ([place]) 12/30/2017  . Edema 03/01/2017  . Hypokalemia   . Slow transit constipation   . Steroid-induced hyperglycemia   . Urinary retention   . Accidental drug overdose   . Postoperative pain   . Hyponatremia   . Cervical myelopathy ([place]) 02/01/2017  . Shortness of breath at rest   . Hypoglycemia   . Spondylosis, cervical, with myelopathy   . Neuropathic pain   . Benign essential HTN   . Labile blood glucose   . Muscle spasm   . GAD (generalized anxiety disorder)   . Acute inflammatory demyelinating polyneuropathy ([place]) 01/11/2017  . Anxiety state   . Neurogenic bladder   . Depression 12/30/2016  . Leg weakness, bilateral 12/30/2016  . 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 GBS (Guillain Barre syndrome) ([place])   . AIDP (acute inflammatory demyelinating polyneuropathy) ([place]) 11/27/2016  . Weakness 11/20/2016  . Weakness of both arms 10/30/2016  . 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 Obstructive sleep  apnea 03/24/2016  . Numbness 03/18/2016  . Mild nonproliferative diabetic retinopathy of both eyes without macular edema associated with type 2 diabetes mellitus ([place]) 05/24/2015  . Nuclear cataract of both eyes 05/24/2015  . Diabetes mellitus without complication ([place]) [id number]  . 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 Eustachian tube dysfunction 02/12/2015  . Acute maxillary sinusitis 02/12/2015  . Wellness examination 08/22/2014  . Alkaline phosphatase elevation 08/22/2014  . 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 Benign non-nodular prostatic hyperplasia with lower urinary tract symptoms 01/31/2014  . Lesion of eyelid 09/26/2013  . Screening for prostate cancer 04/24/2013  . Diabetes ([place]) 06/16/2012  . ED (erectile dysfunction) of organic origin 06/03/2012  . Routine general medical examination at a health care facility 04/10/2012  . BPH (benign prostatic hyperplasia) 02/03/2010  . HEMOCCULT POSITIVE STOOL 05/25/2008  . ELBOW PAIN, RIGHT 07/04/2007  . Dyslipidemia 02/16/2007  . ANXIETY 02/16/2007  . ERECTILE DYSFUNCTION 02/16/2007  . Essential hypertension 02/16/2007  . TESTICULAR MASS, LEFT 02/16/2007  . KNEE PAIN, CHRONIC 02/16/2007  . Michell Heinrich OF 02/16/2007   Kerin Perna, PTA 02/20/19 5:30 PM  [place], [place], [address] Phone: [phone]   Fax:  [phone]  Name: MOODY ISAAC MRN: [id number] Date of Birth: [birth year]

## 2019-02-20 NOTE — Patient Instructions (Signed)
Access Code: [place]  URL: [email or web address]  Date: 02/20/2019  Prepared by: Kerin Perna   Exercises  Supine Piriformis Stretch with Foot on Ground - 3 reps - 1 sets - 20 seconds hold - 2x daily - 7x weekly  Sidelying Thoracic Lumbar Rotation - 5 reps - 1 sets - 10 hold - 2x daily - 7x weekly  Doorway Pec Stretch at 90 Degrees Abduction - 2-3 reps - 1 sets - 20 hold - 2x daily - 7x weekly  Gastroc Stretch on Step - 2-3 reps - 1 sets - 20 hold - 2x daily - 7x weekly  Half Kneeling Dorsiflexion Stretch at Wall (lunge with foot on 2nd step) NOT kneeling at this time- 2-3 reps - 1 sets - 20 hold - 2x daily - 7x weekly  Seated Hamstring Stretch - 2-3 reps - 1 sets - 20 seconds hold - 2x daily - 7x weekly

## 2019-02-23 ENCOUNTER — Ambulatory Visit (INDEPENDENT_AMBULATORY_CARE_PROVIDER_SITE_OTHER): Payer: BC Managed Care – PPO | Admitting: Physical Therapy

## 2019-02-23 ENCOUNTER — Other Ambulatory Visit: Payer: Self-pay

## 2019-02-23 DIAGNOSIS — R2689 Other abnormalities of gait and mobility: Secondary | ICD-10-CM

## 2019-02-23 DIAGNOSIS — M6281 Muscle weakness (generalized): Secondary | ICD-10-CM | POA: Diagnosis not present

## 2019-02-23 DIAGNOSIS — R2681 Unsteadiness on feet: Secondary | ICD-10-CM | POA: Diagnosis not present

## 2019-02-23 NOTE — Therapy (Signed)
[place], [place], [address] Phone: [phone]   Fax:  [phone]  Physical Therapy Treatment  Patient Details  Name: Kenneth Pace MRN: [id number] Date of Birth: 1950-03-15 Referring Provider (PT): Narda Amber, DO   Encounter Date: 02/23/2019  PT End of Session - 02/23/19 0803    Visit Number  5    Number of Visits  12    Date for PT Re-Evaluation  03/24/19    PT Start Time  0803    PT Stop Time  0843    PT Time Calculation (min)  40 min    Equipment Utilized During Treatment  Gait belt    Activity Tolerance  Patient tolerated treatment well    Behavior During Therapy  [facility] for tasks assessed/performed       Past Medical History:  Diagnosis Date  . ADENOIDECTOMY, HX OF 02/16/2007  . Anxiety    pt. states he does not have anxiety.  Marland Kitchen BENIGN PROSTATIC HYPERTROPHY, WITH OBSTRUCTION 02/03/2010  . DIABETES MELLITUS, TYPE I, UNCONTROLLED 12/29/2006  . ERECTILE DYSFUNCTION 02/16/2007  . HYPERLIPIDEMIA 02/16/2007  . HYPERTENSION 02/16/2007  . Sleep apnea    uses CPAP  . TESTICULAR MASS, LEFT 02/16/2007    Past Surgical History:  Procedure Laterality Date  . ANTERIOR CERVICAL DECOMP/DISCECTOMY FUSION N/A 02/01/2017   Procedure: ANTERIOR CERVICAL DECOMPRESSION/DISCECTOMY FUSION CERVICAL THREE-FOUR , CERVICAL FOUR-FIVE  CERVICAL FIVE-SIX;  Surgeon: Consuella Lose, MD;  Location: [facility];  Service: Neurosurgery;  Laterality: N/A;  . COLONOSCOPY  02/05/2010   [facility]  . KNEE SURGERY     2 Lknee arthroscopy, 3 R knee arthroscpoy  . KNEE SURGERY Right 11/12/2016  . TONSILLECTOMY      There were no vitals filed for this visit.  Subjective Assessment - 02/23/19 0920    Subjective  Pt reports no new changes since last visit.    Pertinent History  Guillian Barre, Cervical fusion due to cervical myelopathy (end of 2018), R knee surgery    Patient Stated Goals  improve flexibility,  improve balance (stiffness in legs)    Currently in Pain?  Yes    Pain Score  3     Pain Location  Back    Pain Orientation  Right;Left;Lower    Pain Descriptors / Indicators  Tightness         [facility] PT Assessment - 02/23/19 0001      Assessment   Medical Diagnosis  CIDP    Referring Provider (PT)  Narda Amber, DO    Onset Date/Surgical Date  --   2018   Hand Dominance  Right    Prior Therapy  known to our clinic from prior physical therapy      Ambulation/Gait   Gait velocity  2.97 ft/sec         [facility] Adult PT Treatment/Exercise - 02/23/19 0001      Ambulation/Gait   Ambulation/Gait  Yes    Ambulation/Gait Assistance  6: Modified independent (Device/Increase time)    Ambulation Distance (Feet)  18 Feet   6 trials   Assistive device  None    Gait Pattern  Step-through pattern;Decreased arm swing - right;Decreased hip/knee flexion - right;Decreased hip/knee flexion - left;Right circumduction;Left circumduction;Poor foot clearance - right      Lumbar Exercises: Stretches   Passive Hamstring Stretch  Right;Left;2 reps;20 seconds    Hip Flexor Stretch  Right;Left;3 reps;10 seconds   high kneeling, UE on stool   Piriformis Stretch  Right;Left;2 reps;20 seconds   seated   Gastroc Stretch  Right;Left;2 reps;20 seconds   heels off of step   Other Lumbar Stretch Exercise  quadruped to heel sitting for thigh stretch x 15 sec x 2 reps    Other Lumbar Stretch Exercise  standing with hands on counter - leaning forward for trunk ext x 2 reps       Lumbar Exercises: Quadruped   Other Quadruped Lumbar Exercises  quadruped to cobra pose x 10 sec     Other Quadruped Lumbar Exercises  quadruped with rotation,hand to same hip, elbow pointing to ceiling x 5 reps each side.       Balance Exercises - 02/23/19 0907      Balance Exercises: Standing   Wall Bumps  Eyes opened;Hip;Shoulder;10 reps    Stepping Strategy  Posterior;Lateral;10 reps;Anterior   metronome 50-52 bpm, 5 sets per  LE.    Retro Gait  2 reps    Step Over Hurdles / Cones  5 reps Lt/Rt with intermittent UE support, cues to not lift leg behind cones.     Marching  Solid surface;Static;10 reps          PT Short Term Goals - 02/16/19 1400      PT SHORT TERM GOAL #1   Title  n/a        PT Long Term Goals - 02/07/19 1555      PT LONG TERM GOAL #1   Title  The patient will be indep with HEP.    Time  6    Period  Weeks    Target Date  03/24/19      PT LONG TERM GOAL #2   Title  The patient will improve Berg from 49/56 to > or equal to 52/56 to demo improving static balance.    Time  6    Period  Weeks    Target Date  03/24/19      PT LONG TERM GOAL #3   Title  The patient will improve 5 time sit<>stand from 18.60 seconds to < or equal to 14 seconds to demo improving mobility.    Time  6    Period  Weeks    Target Date  03/24/19      PT LONG TERM GOAL #4   Title  The patient will move floor<>stand with UE support due to h/o falls.    Time  6    Period  Weeks    Target Date  03/24/19      PT LONG TERM GOAL #5   Title  The patient will have gait speed measured and improve 0.5 ft/sec (0.30m/s) to demo improving functional mobility.    Time  6    Period  Weeks    Target Date  03/24/19            Plan - 02/23/19 0857    Clinical Impression Statement  Continued work on stepping strategy in ant/lat/posterior direction, with and without metranome. Retro step with return to neutral was the most challenging. Minor loss of balance in high kneeling when lifting right foot forward (for Lt hip flexor stretch); easily corrected with hands on to mat.  Gait speed increased to 2.97 ft/sec today. Progressing towards established goals.    Comorbidities  diabetes, h/o LBP, guillian barre, cervical myelopathy    Examination-Activity Limitations  Squat;Locomotion Level;Carry    Examination-Participation Restrictions  Community Activity;Yard Work;Shop    Stability/Clinical Decision Making  --   due  to falls   Rehab Potential  Good    PT Frequency  2x / week    PT Duration  6 weeks    PT Treatment/Interventions  ADLs/Self Care Home Management;Neuromuscular re-education;Balance training;Gait training;Stair training;Functional mobility training;Therapeutic activities;Therapeutic exercise;Moist Heat;Patient/family education;Manual techniques;Taping    PT Next Visit Plan  continue stretches to reduce tone and strategies to improve balance.    Consulted and Agree with Plan of Care  Patient       Patient will benefit from skilled therapeutic intervention in order to improve the following deficits and impairments:  Abnormal gait, Difficulty walking, Decreased balance, Decreased strength, Decreased mobility, Impaired flexibility, Pain, Impaired tone, Decreased coordination  Visit Diagnosis: Muscle weakness (generalized)  Unsteadiness on feet  Other abnormalities of gait and mobility     Problem List Patient Active Problem List   Diagnosis Date Noted  . Myalgia 11/01/2018  . Chronic bilateral low back pain without sciatica 07/21/2018  . 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 Edema 03/01/2017  . Hypokalemia   . Slow transit constipation   . Steroid-induced hyperglycemia   . Urinary retention   . Accidental drug overdose   . Postoperative pain   . Hyponatremia   . Cervical myelopathy ([place]) 02/01/2017  . Shortness of breath at rest   . Hypoglycemia   . Spondylosis, cervical, with myelopathy   . Neuropathic pain   . Benign essential HTN   . Labile blood glucose   . Muscle spasm   . GAD (generalized anxiety disorder)   . Acute inflammatory demyelinating polyneuropathy ([place]) 01/11/2017  . Anxiety state   . Neurogenic bladder   . Depression 12/30/2016  . Leg weakness, bilateral 12/30/2016  . 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 GBS (Guillain Barre syndrome)  ([place])   . AIDP (acute inflammatory demyelinating polyneuropathy) ([place]) 11/27/2016  . Weakness 11/20/2016  . Weakness of both arms 10/30/2016  . 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 Obstructive sleep apnea 03/24/2016  . Numbness 03/18/2016  . Mild nonproliferative diabetic retinopathy of both eyes without macular edema associated with type 2 diabetes mellitus ([place]) 05/24/2015  . Nuclear cataract of both eyes 05/24/2015  . Diabetes mellitus without complication ([employer]) [id number]  . 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 Eustachian tube dysfunction 02/12/2015  . Acute maxillary sinusitis 02/12/2015  . Wellness examination 08/22/2014  . Alkaline phosphatase elevation 08/22/2014  . 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 Benign non-nodular prostatic hyperplasia with lower urinary tract symptoms 01/31/2014  . Lesion of eyelid 09/26/2013  . Screening for prostate cancer 04/24/2013  . Diabetes ([place]) 06/16/2012  . ED (erectile dysfunction) of organic origin 06/03/2012  . Routine general medical examination at a health care facility 04/10/2012  . BPH (benign prostatic hyperplasia) 02/03/2010  . HEMOCCULT POSITIVE STOOL 05/25/2008  . ELBOW PAIN, RIGHT 07/04/2007  . Dyslipidemia 02/16/2007  . ANXIETY 02/16/2007  . ERECTILE DYSFUNCTION 02/16/2007  . Essential hypertension 02/16/2007  . TESTICULAR MASS, LEFT 02/16/2007  . KNEE PAIN, CHRONIC 02/16/2007  . Michell Heinrich OF 02/16/2007   Kerin Perna, PTA 02/23/19 9:21 AM  [place], [place], [address] Phone: [phone]   Fax:  [phone]  Name: LORRAINE RATHGEB MRN: [id number] Date of Birth: 01-21-1951

## 2019-02-27 ENCOUNTER — Encounter: Payer: BC Managed Care – PPO | Admitting: Family Medicine

## 2019-02-27 ENCOUNTER — Encounter (HOSPITAL_COMMUNITY): Payer: BC Managed Care – PPO

## 2019-02-28 ENCOUNTER — Encounter: Payer: Self-pay | Admitting: Physical Therapy

## 2019-02-28 ENCOUNTER — Other Ambulatory Visit: Payer: Self-pay

## 2019-02-28 ENCOUNTER — Ambulatory Visit (INDEPENDENT_AMBULATORY_CARE_PROVIDER_SITE_OTHER): Payer: BC Managed Care – PPO | Admitting: Physical Therapy

## 2019-02-28 DIAGNOSIS — M6281 Muscle weakness (generalized): Secondary | ICD-10-CM

## 2019-02-28 DIAGNOSIS — R2681 Unsteadiness on feet: Secondary | ICD-10-CM

## 2019-02-28 DIAGNOSIS — R2689 Other abnormalities of gait and mobility: Secondary | ICD-10-CM | POA: Diagnosis not present

## 2019-02-28 NOTE — Therapy (Signed)
[place], [place], [address] Phone: [phone]   Fax:  [phone]  Physical Therapy Treatment  Patient Details  Name: Kenneth Pace MRN: [id number] Date of Birth: December 08, 1950 Referring Provider (PT): Narda Amber, DO   Encounter Date: 02/28/2019  PT End of Session - 02/28/19 0852    Visit Number  6    Number of Visits  12    Date for PT Re-Evaluation  03/24/19    PT Start Time  0850    PT Stop Time  0925    PT Time Calculation (min)  35 min    Equipment Utilized During Treatment  Gait belt    Activity Tolerance  Patient tolerated treatment well    Behavior During Therapy  [facility] for tasks assessed/performed       Past Medical History:  Diagnosis Date  . ADENOIDECTOMY, HX OF 02/16/2007  . Anxiety    pt. states he does not have anxiety.  Marland Kitchen BENIGN PROSTATIC HYPERTROPHY, WITH OBSTRUCTION 02/03/2010  . DIABETES MELLITUS, TYPE I, UNCONTROLLED 12/29/2006  . ERECTILE DYSFUNCTION 02/16/2007  . HYPERLIPIDEMIA 02/16/2007  . HYPERTENSION 02/16/2007  . Sleep apnea    uses CPAP  . TESTICULAR MASS, LEFT 02/16/2007    Past Surgical History:  Procedure Laterality Date  . ANTERIOR CERVICAL DECOMP/DISCECTOMY FUSION N/A 02/01/2017   Procedure: ANTERIOR CERVICAL DECOMPRESSION/DISCECTOMY FUSION CERVICAL THREE-FOUR , CERVICAL FOUR-FIVE  CERVICAL FIVE-SIX;  Surgeon: Consuella Lose, MD;  Location: [facility];  Service: Neurosurgery;  Laterality: N/A;  . COLONOSCOPY  02/05/2010   [facility]  . KNEE SURGERY     2 Lknee arthroscopy, 3 R knee arthroscpoy  . KNEE SURGERY Right 11/12/2016  . TONSILLECTOMY      There were no vitals filed for this visit.  Subjective Assessment - 02/28/19 0852    Subjective  Pt reports his back is painful today; was picking up dog poop for 2 hrs Sunday afternoon.    Pertinent History  Guillian Barre, Cervical fusion due to cervical myelopathy (end of 2018), R knee surgery     Currently in Pain?  Yes    Pain Score  6     Pain Location  Back    Pain Orientation  Right;Left;Lower    Pain Descriptors / Indicators  Sore    Aggravating Factors   long periods of bending over    Pain Relieving Factors  heating pad         [facility] PT Assessment - 02/28/19 0001      Assessment   Medical Diagnosis  CIDP    Referring Provider (PT)  Narda Amber, DO    Onset Date/Surgical Date  --   2018   Hand Dominance  Right    Prior Therapy  known to our clinic from prior physical therapy       [facility] Adult PT Treatment/Exercise - 02/28/19 0001      Lumbar Exercises: Stretches   Passive Hamstring Stretch  Right;Left;2 reps;20 seconds    Single Knee to Chest Stretch  Right;Left;10 seconds;3 reps    Lower Trunk Rotation  3 reps;10 seconds   arms in T   Hip Flexor Stretch  Right;Left;3 reps;10 seconds   high kneeling, UE on stool   Other Lumbar Stretch Exercise  quadruped to heel sitting for thigh stretch x 15 sec x 2 reps    Other Lumbar Stretch Exercise  standing with hands on counter - leaning forward for trunk ext x 3 reps  Lumbar Exercises: Quadruped   Other Quadruped Lumbar Exercises  quadruped to cobra pose x 10 sec       Knee/Hip Exercises: Aerobic   Nustep  L4: 6.5 min for warm up       Knee/Hip Exercises: Standing   Other Standing Knee Exercises  standing to/from high kneeling on 3" foam with minimal use of UE support x 2 reps each leg forward for ascending.       Balance Exercises - 02/28/19 1305      Balance Exercises: Standing   Stepping Strategy  Posterior;Lateral;10 reps;Anterior   metronome 52-54 bpm, 2 sets per LE.    Partial Tandem Stance  Eyes open;2 reps;20 secs    Sidestepping  2 reps   SBA; increased step height         PT Short Term Goals - 02/16/19 1400      PT SHORT TERM GOAL #1   Title  n/a        PT Long Term Goals - 02/07/19 1555      PT LONG TERM GOAL #1   Title  The patient will be indep with HEP.    Time  6     Period  Weeks    Target Date  03/24/19      PT LONG TERM GOAL #2   Title  The patient will improve Berg from 49/56 to > or equal to 52/56 to demo improving static balance.    Time  6    Period  Weeks    Target Date  03/24/19      PT LONG TERM GOAL #3   Title  The patient will improve 5 time sit<>stand from 18.60 seconds to < or equal to 14 seconds to demo improving mobility.    Time  6    Period  Weeks    Target Date  03/24/19      PT LONG TERM GOAL #4   Title  The patient will move floor<>stand with UE support due to h/o falls.    Time  6    Period  Weeks    Target Date  03/24/19      PT LONG TERM GOAL #5   Title  The patient will have gait speed measured and improve 0.5 ft/sec (0.58m/s) to demo improving functional mobility.    Time  6    Period  Weeks    Target Date  03/24/19            Plan - 02/28/19 1309    Clinical Impression Statement  Pt reporting increased low back pain from work in yard two days ago.   Stretches for LE and low back eased pain.  High kneeling to single leg kneeling transition improving. Continued work on stepping strategies for dynamic balance.  Pt progressing towards established goals.    Comorbidities  diabetes, h/o LBP, guillian barre, cervical myelopathy    Examination-Activity Limitations  Squat;Locomotion Level;Carry    Examination-Participation Restrictions  Community Activity;Yard Work;Shop    Stability/Clinical Decision Making  --   due to falls   Rehab Potential  Good    PT Frequency  2x / week    PT Duration  6 weeks    PT Treatment/Interventions  ADLs/Self Care Home Management;Neuromuscular re-education;Balance training;Gait training;Stair training;Functional mobility training;Therapeutic activities;Therapeutic exercise;Moist Heat;Patient/family education;Manual techniques;Taping    PT Next Visit Plan  continue stretches to reduce tone and strategies to improve balance.    Consulted and Agree with Plan of Care  Patient        Patient will benefit from skilled therapeutic intervention in order to improve the following deficits and impairments:  Abnormal gait, Difficulty walking, Decreased balance, Decreased strength, Decreased mobility, Impaired flexibility, Pain, Impaired tone, Decreased coordination  Visit Diagnosis: Muscle weakness (generalized)  Unsteadiness on feet  Other abnormalities of gait and mobility     Problem List Patient Active Problem List   Diagnosis Date Noted  . Myalgia 11/01/2018  . Chronic bilateral low back pain without sciatica 07/21/2018  . 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 Edema 03/01/2017  . Hypokalemia   . Slow transit constipation   . Steroid-induced hyperglycemia   . Urinary retention   . Accidental drug overdose   . Postoperative pain   . Hyponatremia   . Cervical myelopathy ([place]) 02/01/2017  . Shortness of breath at rest   . Hypoglycemia   . Spondylosis, cervical, with myelopathy   . Neuropathic pain   . Benign essential HTN   . Labile blood glucose   . Muscle spasm   . GAD (generalized anxiety disorder)   . Acute inflammatory demyelinating polyneuropathy ([place]) 01/11/2017  . Anxiety state   . Neurogenic bladder   . Depression 12/30/2016  . Leg weakness, bilateral 12/30/2016  . 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 GBS (Guillain Barre syndrome) ([place])   . AIDP (acute inflammatory demyelinating polyneuropathy) ([place]) 11/27/2016  . Weakness 11/20/2016  . Weakness of both arms 10/30/2016  . 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 Obstructive sleep apnea 03/24/2016  . Numbness 03/18/2016  . Mild nonproliferative diabetic retinopathy of both eyes without macular edema associated with type 2 diabetes mellitus ([place]) 05/24/2015  . Nuclear cataract of both eyes 05/24/2015  . Diabetes mellitus without complication ([place]) [id number]   . 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 Eustachian tube dysfunction 02/12/2015  . Acute maxillary sinusitis 02/12/2015  . Wellness examination 08/22/2014  . Alkaline phosphatase elevation 08/22/2014  . 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 Benign non-nodular prostatic hyperplasia with lower urinary tract symptoms 01/31/2014  . Lesion of eyelid 09/26/2013  . Screening for prostate cancer 04/24/2013  . Diabetes ([place]) 06/16/2012  . ED (erectile dysfunction) of organic origin 06/03/2012  . Routine general medical examination at a health care facility 04/10/2012  . BPH (benign prostatic hyperplasia) 02/03/2010  . HEMOCCULT POSITIVE STOOL 05/25/2008  . ELBOW PAIN, RIGHT 07/04/2007  . Dyslipidemia 02/16/2007  . ANXIETY 02/16/2007  . ERECTILE DYSFUNCTION 02/16/2007  . Essential hypertension 02/16/2007  . TESTICULAR MASS, LEFT 02/16/2007  . KNEE PAIN, CHRONIC 02/16/2007  . ADENOIDECTOMY, HX OF 02/16/2007   Kerin Perna, PTA 02/28/19 1:12 PM  [place], [place], [address] Phone: [phone]   Fax:  [phone]  Name: Kenneth Pace MRN: [id number] Date of Birth: [birth year]

## 2019-03-01 DIAGNOSIS — G4733 Obstructive sleep apnea (adult) (pediatric): Secondary | ICD-10-CM | POA: Diagnosis not present

## 2019-03-02 ENCOUNTER — Encounter: Payer: Self-pay | Admitting: Rehabilitative and Restorative Service Providers"

## 2019-03-02 ENCOUNTER — Ambulatory Visit: Payer: BC Managed Care – PPO | Admitting: Physical Medicine & Rehabilitation

## 2019-03-02 ENCOUNTER — Ambulatory Visit (INDEPENDENT_AMBULATORY_CARE_PROVIDER_SITE_OTHER): Payer: BC Managed Care – PPO | Admitting: Rehabilitative and Restorative Service Providers"

## 2019-03-02 ENCOUNTER — Other Ambulatory Visit: Payer: Self-pay

## 2019-03-02 DIAGNOSIS — M6281 Muscle weakness (generalized): Secondary | ICD-10-CM | POA: Diagnosis not present

## 2019-03-02 DIAGNOSIS — R2689 Other abnormalities of gait and mobility: Secondary | ICD-10-CM | POA: Diagnosis not present

## 2019-03-02 DIAGNOSIS — R2681 Unsteadiness on feet: Secondary | ICD-10-CM

## 2019-03-02 NOTE — Therapy (Signed)
[place], [place], [address] Phone: [phone]   Fax:  [phone]  Physical Therapy Treatment  Patient Details  Name: Kenneth Pace MRN: [id number] Date of Birth: April 28, 1950 Referring Provider (PT): Narda Amber, DO   Encounter Date: 03/02/2019  PT End of Session - 03/02/19 0850    Visit Number  7    Number of Visits  12    Date for PT Re-Evaluation  03/24/19    PT Start Time  0847    PT Stop Time  0930    PT Time Calculation (min)  43 min    Equipment Utilized During Treatment  Gait belt    Activity Tolerance  Patient tolerated treatment well    Behavior During Therapy  [facility] for tasks assessed/performed       Past Medical History:  Diagnosis Date  . ADENOIDECTOMY, HX OF 02/16/2007  . Anxiety    pt. states he does not have anxiety.  Marland Kitchen BENIGN PROSTATIC HYPERTROPHY, WITH OBSTRUCTION 02/03/2010  . DIABETES MELLITUS, TYPE I, UNCONTROLLED 12/29/2006  . ERECTILE DYSFUNCTION 02/16/2007  . HYPERLIPIDEMIA 02/16/2007  . HYPERTENSION 02/16/2007  . Sleep apnea    uses CPAP  . TESTICULAR MASS, LEFT 02/16/2007    Past Surgical History:  Procedure Laterality Date  . ANTERIOR CERVICAL DECOMP/DISCECTOMY FUSION N/A 02/01/2017   Procedure: ANTERIOR CERVICAL DECOMPRESSION/DISCECTOMY FUSION CERVICAL THREE-FOUR , CERVICAL FOUR-FIVE  CERVICAL FIVE-SIX;  Surgeon: Consuella Lose, MD;  Location: [facility];  Service: Neurosurgery;  Laterality: N/A;  . COLONOSCOPY  02/05/2010   [facility]  . KNEE SURGERY     2 Lknee arthroscopy, 3 R knee arthroscpoy  . KNEE SURGERY Right 11/12/2016  . TONSILLECTOMY      There were no vitals filed for this visit.  Subjective Assessment - 03/02/19 0849    Subjective  The patient reports his low back is feeling better today as compared to last session.    Pertinent History  Guillian Barre, Cervical fusion due to cervical myelopathy (end of 2018), R knee surgery    Patient Stated Goals  improve flexibility, improve balance (stiffness in legs)    Currently in Pain?  Yes    Pain Score  3     Pain Location  Back    Pain Descriptors / Indicators  Sore    Pain Type  Chronic pain    Pain Onset  More than a month ago    Pain Frequency  Constant    Aggravating Factors   increasing activity    Pain Relieving Factors  heating pad         [facility] PT Assessment - 03/02/19 0902      Ambulation/Gait   Ambulation/Gait  Yes    Ambulation/Gait Assistance  6: Modified independent (Device/Increase time)    Gait Comments  Dynamic gait activities walking stepping over pool noodle, stepping around and over oval discs and other obstacles with goal of improving functional ambulation and balance recovery during dynamic tasks.      Berg Balance Test   Sit to Stand  Able to stand without using hands and stabilize independently    Standing Unsupported  Able to stand safely 2 minutes    Sitting with Back Unsupported but Feet Supported on Floor or Stool  Able to sit safely and securely 2 minutes    Stand to Sit  Sits safely with minimal use of hands    Transfers  Able to transfer safely, minor use of hands  Standing Unsupported with Eyes Closed  Able to stand 10 seconds safely    Standing Unsupported with Feet Together  Able to place feet together independently and stand 1 minute safely    From Standing, Reach Forward with Outstretched Arm  Can reach confidently >25 cm (10")    From Standing Position, Pick up Object from Floor  Able to pick up shoe safely and easily    From Standing Position, Turn to Look Behind Over each Shoulder  Looks behind from both sides and weight shifts well    Turn 360 Degrees  Able to turn 360 degrees safely in 4 seconds or less    Standing Unsupported, Alternately Place Feet on Step/Stool  Able to stand independently and safely and complete 8 steps in 20 seconds    Standing Unsupported, One Foot in Front  Able to plae foot ahead of the other  independently and hold 30 seconds   some trunk lean to compensate   Standing on One Leg  Able to lift leg independently and hold 5-10 seconds    Total Score  54    Berg comment:  54/56                   [facility] Adult PT Treatment/Exercise - 03/02/19 [id number]      Neuro Re-ed    Neuro Re-ed Details   [facility] tapping for right/left weight shifting and turning 180 degrees while stepping forward/back and over obstacles.  Dynamic balance tasks for replication of functional tasks and working on proactive and reactive balance strategies.  Tall kneeling to 1/2 kneeling working on rhythmic stabilization with variable UE resistance and release for postural stabilization.      Exercises   Exercises  Other Exercises;Knee/Hip      Lumbar Exercises: Stretches   Hip Flexor Stretch  Right;Left;2 reps;30 seconds    Other Lumbar Stretch Exercise  supine lumbar rotation x 10 reps    Other Lumbar Stretch Exercise  wide leg trunk rotation for deep hip stretch      Knee/Hip Exercises: Aerobic   Nustep  L5 x 5 minutes      Knee/Hip Exercises: Supine   Bridges  5 reps    Bridges Limitations  bridges with marching 5 reps bilaterally.    Other Supine Knee/Hip Exercises  bent knee fallouts x 8 reps               PT Short Term Goals - 02/16/19 1400      PT SHORT TERM GOAL #1   Title  n/a        PT Long Term Goals - 03/02/19 0931      PT LONG TERM GOAL #1   Title  The patient will be indep with HEP.    Time  6    Period  Weeks      PT LONG TERM GOAL #2   Title  The patient will improve Berg from 49/56 to > or equal to 52/56 to demo improving static balance.    Baseline  54/56.    Time  6    Period  Weeks    Status  Achieved      PT LONG TERM GOAL #3   Title  The patient will improve 5 time sit<>stand from 18.60 seconds to < or equal to 14 seconds to demo improving mobility.    Time  6    Period  Weeks      PT LONG TERM GOAL #4  Title  The patient will move floor<>stand with  UE support due to h/o falls.    Time  6    Period  Weeks      PT LONG TERM GOAL #5   Title  The patient will have gait speed measured and improve 0.5 ft/sec (0.63m/s) to demo improving functional mobility.    Baseline  2.97 ft/sec  *Patient is walking at full community ambulator classification of gait.  No goal indicated at this time.    Time  6    Period  Weeks    Status  Deferred            Plan - 03/02/19 1408    Clinical Impression Statement  The patient has reduced low back today.  He is performing stretching regularly.  He has improved Berg balance score meeting LTG.  PT d/c goal for gait speed as patient is in range for full community ambulator classification.  Plan to continue to progress dynamic balance/dynamic gait activities.    PT Treatment/Interventions  ADLs/Self Care Home Management;Neuromuscular re-education;Balance training;Gait training;Stair training;Functional mobility training;Therapeutic activities;Therapeutic exercise;Moist Heat;Patient/family education;Manual techniques;Taping    PT Next Visit Plan  LE stretching, trunk stretching, balance (standing dynamic), dynamic gait activities.    Consulted and Agree with Plan of Care  Patient       Patient will benefit from skilled therapeutic intervention in order to improve the following deficits and impairments:  Abnormal gait, Difficulty walking, Decreased balance, Decreased strength, Decreased mobility, Impaired flexibility, Pain, Impaired tone, Decreased coordination  Visit Diagnosis: Muscle weakness (generalized)  Unsteadiness on feet  Other abnormalities of gait and mobility     Problem List Patient Active Problem List   Diagnosis Date Noted  . Myalgia 11/01/2018  . Chronic bilateral low back pain without sciatica 07/21/2018  . 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 Edema 03/01/2017  . Hypokalemia   . Slow transit  constipation   . Steroid-induced hyperglycemia   . Urinary retention   . Accidental drug overdose   . Postoperative pain   . Hyponatremia   . Cervical myelopathy ([place]) 02/01/2017  . Shortness of breath at rest   . Hypoglycemia   . Spondylosis, cervical, with myelopathy   . Neuropathic pain   . Benign essential HTN   . Labile blood glucose   . Muscle spasm   . GAD (generalized anxiety disorder)   . Acute inflammatory demyelinating polyneuropathy ([place]) 01/11/2017  . Anxiety state   . Neurogenic bladder   . Depression 12/30/2016  . Leg weakness, bilateral 12/30/2016  . 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 GBS (Guillain Barre syndrome) ([place])   . AIDP (acute inflammatory demyelinating polyneuropathy) ([place]) 11/27/2016  . Weakness 11/20/2016  . Weakness of both arms 10/30/2016  . 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 Obstructive sleep apnea 03/24/2016  . Numbness 03/18/2016  . Mild nonproliferative diabetic retinopathy of both eyes without macular edema associated with type 2 diabetes mellitus ([place]) 05/24/2015  . Nuclear cataract of both eyes 05/24/2015  . Diabetes mellitus without complication ([place]) [id number]  . 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 Eustachian tube dysfunction 02/12/2015  . Acute maxillary sinusitis 02/12/2015  . Wellness examination 08/22/2014  . Alkaline phosphatase elevation 08/22/2014  . 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 Benign non-nodular prostatic hyperplasia with lower urinary tract symptoms 01/31/2014  . Lesion of eyelid 09/26/2013  . Screening for prostate cancer 04/24/2013  .  Diabetes ([place]) 06/16/2012  . ED (erectile dysfunction) of organic origin 06/03/2012  . Routine general medical examination at a health care facility 04/10/2012  . BPH (benign prostatic hyperplasia) 02/03/2010  . HEMOCCULT POSITIVE STOOL 05/25/2008  . ELBOW PAIN, RIGHT 07/04/2007  . Dyslipidemia  02/16/2007  . ANXIETY 02/16/2007  . ERECTILE DYSFUNCTION 02/16/2007  . Essential hypertension 02/16/2007  . TESTICULAR MASS, LEFT 02/16/2007  . KNEE PAIN, CHRONIC 02/16/2007  . ADENOIDECTOMY, HX OF 02/16/2007    Jaice Lague,PT 03/02/2019, 2:10 PM  [facility] [place], [place], [address] Phone: [phone]   Fax:  [phone]  Name: Kenneth Pace MRN: [id number] Date of Birth: 07/30/1950

## 2019-03-03 ENCOUNTER — Ambulatory Visit (HOSPITAL_COMMUNITY)
Admission: RE | Admit: 2019-03-03 | Discharge: 2019-03-03 | Disposition: A | Discharge status: Home / Self Care | Payer: BC Managed Care – PPO | Source: Ambulatory Visit | Attending: Neurology | Admitting: Neurology

## 2019-03-03 DIAGNOSIS — M25522 Pain in left elbow: Secondary | ICD-10-CM | POA: Diagnosis not present

## 2019-03-03 DIAGNOSIS — G6181 Chronic inflammatory demyelinating polyneuritis: Secondary | ICD-10-CM | POA: Insufficient documentation

## 2019-03-03 MED ORDER — IMMUNE GLOBULIN (HUMAN) 10 GM/100ML IV SOLN
1.0000 g/kg | INTRAVENOUS | Status: DC
  Administered 2019-03-03: 09:00:00 105 g via INTRAVENOUS
  Filled 2019-03-03: qty 1000

## 2019-03-06 ENCOUNTER — Ambulatory Visit (INDEPENDENT_AMBULATORY_CARE_PROVIDER_SITE_OTHER): Payer: BC Managed Care – PPO | Admitting: Physical Therapy

## 2019-03-06 ENCOUNTER — Other Ambulatory Visit: Payer: Self-pay

## 2019-03-06 DIAGNOSIS — R2681 Unsteadiness on feet: Secondary | ICD-10-CM | POA: Diagnosis not present

## 2019-03-06 DIAGNOSIS — R2689 Other abnormalities of gait and mobility: Secondary | ICD-10-CM | POA: Diagnosis not present

## 2019-03-06 DIAGNOSIS — M6281 Muscle weakness (generalized): Secondary | ICD-10-CM

## 2019-03-06 NOTE — Therapy (Signed)
[facility] [place], [place], [address] Phone: [phone]   Fax:  [phone]  Physical Therapy Treatment  Patient Details  Name: Kenneth Pace MRN: [id number] Date of Birth: [birth year] Referring Provider (PT): Kenneth Amber, DO   Encounter Date: 03/06/2019  PT End of Session - 03/06/19 0854    Visit Number  8    Number of Visits  12    Date for PT Re-Evaluation  03/24/19    PT Start Time  0849    PT Stop Time  0927    PT Time Calculation (min)  38 min    Activity Tolerance  Patient tolerated treatment well    Behavior During Therapy  [facility] for tasks assessed/performed       Past Medical History:  Diagnosis Date  . ADENOIDECTOMY, HX OF 02/16/2007  . Anxiety    pt. states he does not have anxiety.  Marland Kitchen BENIGN PROSTATIC HYPERTROPHY, WITH OBSTRUCTION 02/03/2010  . DIABETES MELLITUS, TYPE I, UNCONTROLLED 12/29/2006  . ERECTILE DYSFUNCTION 02/16/2007  . HYPERLIPIDEMIA 02/16/2007  . HYPERTENSION 02/16/2007  . Sleep apnea    uses CPAP  . TESTICULAR MASS, LEFT 02/16/2007    Past Surgical History:  Procedure Laterality Date  . ANTERIOR CERVICAL DECOMP/DISCECTOMY FUSION N/A 02/01/2017   Procedure: ANTERIOR CERVICAL DECOMPRESSION/DISCECTOMY FUSION CERVICAL THREE-FOUR , CERVICAL FOUR-FIVE  CERVICAL FIVE-SIX;  Surgeon: Kenneth Lose, MD;  Location: [facility];  Service: Neurosurgery;  Laterality: N/A;  . COLONOSCOPY  02/05/2010   Kenneth Pace  . KNEE SURGERY     2 Lknee arthroscopy, 3 R knee arthroscpoy  . KNEE SURGERY Right 11/12/2016  . TONSILLECTOMY      There were no vitals filed for this visit.  Subjective Assessment - 03/06/19 0854    Subjective  Pt reports no new changes since last visit.    Pertinent History  Guillian Barre, Cervical fusion due to cervical myelopathy (end of 2018), R knee surgery    Patient Stated Goals  improve flexibility, improve balance (stiffness in legs)    Currently in  Pain?  Yes    Pain Score  2     Pain Location  Back    Pain Orientation  Left;Right;Lower    Pain Descriptors / Indicators  Sore    Aggravating Factors   increasing activity    Pain Relieving Factors  heating pad         [facility] PT Assessment - 03/06/19 0001      Assessment   Medical Diagnosis  CIDP    Referring Provider (PT)  Kenneth Amber, DO    Onset Date/Surgical Date  --   2018   Hand Dominance  Right    Prior Therapy  known to our clinic from prior physical therapy       [facility] Adult PT Treatment/Exercise - 03/06/19 0001      Ambulation/Gait   Gait Comments  Dynamic gait activities walking stepping over pool noodle, stepping around and over oval discs and other obstacles with goal of improving functional ambulation and balance recovery during dynamic tasks.      Neuro Re-ed    Neuro Re-ed Details   [facility] tapping for right/left weight shifting and turning 180 degrees while stepping forward/back and over obstacles.  Dynamic balance tasks for replication of functional tasks and working on proactive and reactive balance strategies.  Tall kneeling to 1/2 kneeling working on rhythmic stabilization with variable UE resistance and release for postural stabilization.  Lumbar Exercises: Stretches   Passive Hamstring Stretch  Right;Left;20 seconds    Hip Flexor Stretch  Right;Left;2 reps;30 seconds    Gastroc Stretch  Right;Left;2 reps;20 seconds   both- incline board.    Other Lumbar Stretch Exercise  lat/hamstring stretch with hands on back of truck and leaning back into stretch x 20 sec       Lumbar Exercises: Quadruped   Other Quadruped Lumbar Exercises  1/2 kneeling with wood chop motion x 8 reps each side (CGA for safety; unsteady) - loss of balance with kneeling on LLE/Rt leg forward, steadied with min A.        Balance Exercises - 03/06/19 1442      Balance Exercises: Standing   Wall Bumps  Hip;Eyes opened;10 reps   repeated on 45 deg angle L/R   Rockerboard   Anterior/posterior;30 seconds;EO;Intermittent UE support          PT Short Term Goals - 02/16/19 1400      PT SHORT TERM GOAL #1   Title  n/a        PT Long Term Goals - 03/02/19 0931      PT LONG TERM GOAL #1   Title  The patient will be indep with HEP.    Time  6    Period  Weeks      PT LONG TERM GOAL #2   Title  The patient will improve Berg from 49/56 to > or equal to 52/56 to demo improving static balance.    Baseline  54/56.    Time  6    Period  Weeks    Status  Achieved      PT LONG TERM GOAL #3   Title  The patient will improve 5 time sit<>stand from 18.60 seconds to < or equal to 14 seconds to demo improving mobility.    Time  6    Period  Weeks      PT LONG TERM GOAL #4   Title  The patient will move floor<>stand with UE support due to h/o falls.    Time  6    Period  Weeks      PT LONG TERM GOAL #5   Title  The patient will have gait speed measured and improve 0.5 ft/sec (0.89m/s) to demo improving functional mobility.    Baseline  2.97 ft/sec  *Patient is walking at full community ambulator classification of gait.  No goal indicated at this time.    Time  6    Period  Weeks    Status  Deferred            Plan - 03/06/19 1450    Clinical Impression Statement  Pt demonstrates decreased balance with Lt SLS dynamic balance exercises; also demonstrates less balance with Lt tall kneeling.  Pt near meeting remaining goals. Plan to continue to progress dynamic balance/ gait activities.    Rehab Potential  Good    PT Frequency  2x / week    PT Duration  6 weeks    PT Treatment/Interventions  ADLs/Self Care Home Management;Neuromuscular re-education;Balance training;Gait training;Stair training;Functional mobility training;Therapeutic activities;Therapeutic exercise;Moist Heat;Patient/family education;Manual techniques;Taping    PT Next Visit Plan  LE stretching, trunk stretching, balance (standing dynamic), dynamic gait activities.    Consulted and  Agree with Plan of Care  Patient       Patient will benefit from skilled therapeutic intervention in order to improve the following deficits and impairments:  Abnormal gait, Difficulty walking,  Decreased balance, Decreased strength, Decreased mobility, Impaired flexibility, Pain, Impaired tone, Decreased coordination  Visit Diagnosis: Muscle weakness (generalized)  Unsteadiness on feet  Other abnormalities of gait and mobility     Problem List Patient Active Problem List   Diagnosis Date Noted  . Myalgia 11/01/2018  . Chronic bilateral low back pain without sciatica 07/21/2018  . 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 CIDP (chronic inflammatory demyelinating polyneuropathy) ([place]) 12/30/2017  . Edema 03/01/2017  . Hypokalemia   . Slow transit constipation   . Steroid-induced hyperglycemia   . Urinary retention   . Accidental drug overdose   . Postoperative pain   . Hyponatremia   . Cervical myelopathy ([place]) 02/01/2017  . Shortness of breath at rest   . Hypoglycemia   . Spondylosis, cervical, with myelopathy   . Neuropathic pain   . Benign essential HTN   . Labile blood glucose   . Muscle spasm   . GAD (generalized anxiety disorder)   . Acute inflammatory demyelinating polyneuropathy ([place]) 01/11/2017  . Anxiety state   . Neurogenic bladder   . Depression 12/30/2016  . Leg weakness, bilateral 12/30/2016  . 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[place]) 12/30/2016  . GBS (Guillain Barre syndrome) ([place])   . AIDP (acute inflammatory demyelinating polyneuropathy) ([place]) 11/27/2016  . Weakness 11/20/2016  . Weakness of both arms 10/30/2016  . 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 Obstructive sleep apnea 03/24/2016  . Numbness 03/18/2016  . 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 Nuclear cataract of both eyes 05/24/2015  . Diabetes mellitus  without complication ([place]) [id number]  . 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 Eustachian tube dysfunction 02/12/2015  . Acute maxillary sinusitis 02/12/2015  . Wellness examination 08/22/2014  . Alkaline phosphatase elevation 08/22/2014  . 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 Benign non-nodular prostatic hyperplasia with lower urinary tract symptoms 01/31/2014  . Lesion of eyelid 09/26/2013  . Screening for prostate cancer 04/24/2013  . Diabetes ([place]) 06/16/2012  . ED (erectile dysfunction) of organic origin 06/03/2012  . Routine general medical examination at a health care facility 04/10/2012  . BPH (benign prostatic hyperplasia) 02/03/2010  . HEMOCCULT POSITIVE STOOL 05/25/2008  . ELBOW PAIN, RIGHT 07/04/2007  . Dyslipidemia 02/16/2007  . ANXIETY 02/16/2007  . ERECTILE DYSFUNCTION 02/16/2007  . Essential hypertension 02/16/2007  . TESTICULAR MASS, LEFT 02/16/2007  . KNEE PAIN, CHRONIC 02/16/2007  . Michell Heinrich OF 02/16/2007   Kerin Perna, PTA 03/06/19 2:55 PM  [facility] [place], [place], [address] Phone: [phone]   Fax:  [phone]  Name: Kenneth Pace MRN: [id number] Date of Birth: 02-21-1950

## 2019-03-07 ENCOUNTER — Encounter
Payer: BC Managed Care – PPO | Attending: Physical Medicine & Rehabilitation | Admitting: Physical Medicine & Rehabilitation

## 2019-03-07 ENCOUNTER — Encounter: Payer: Self-pay | Admitting: Physical Medicine & Rehabilitation

## 2019-03-07 ENCOUNTER — Other Ambulatory Visit: Payer: Self-pay

## 2019-03-07 VITALS — BP 168/91 | HR 67 | Temp 97.2°F | Ht 72.0 in | Wt 236.0 lb

## 2019-03-07 DIAGNOSIS — G959 Disease of spinal cord, unspecified: Secondary | ICD-10-CM | POA: Diagnosis not present

## 2019-03-07 DIAGNOSIS — M792 Neuralgia and neuritis, unspecified: Secondary | ICD-10-CM | POA: Diagnosis not present

## 2019-03-07 DIAGNOSIS — R269 Unspecified abnormalities of gait and mobility: Secondary | ICD-10-CM

## 2019-03-07 DIAGNOSIS — R261 Paralytic gait: Secondary | ICD-10-CM | POA: Diagnosis not present

## 2019-03-07 DIAGNOSIS — M545 Low back pain: Secondary | ICD-10-CM | POA: Diagnosis not present

## 2019-03-07 DIAGNOSIS — G8929 Other chronic pain: Secondary | ICD-10-CM | POA: Insufficient documentation

## 2019-03-07 DIAGNOSIS — G6181 Chronic inflammatory demyelinating polyneuritis: Secondary | ICD-10-CM | POA: Diagnosis not present

## 2019-03-07 DIAGNOSIS — I10 Essential (primary) hypertension: Secondary | ICD-10-CM | POA: Insufficient documentation

## 2019-03-07 DIAGNOSIS — M791 Myalgia, unspecified site: Secondary | ICD-10-CM | POA: Diagnosis not present

## 2019-03-07 MED ORDER — CELECOXIB 100 MG PO CAPS: ORAL_CAPSULE | ORAL | 2 refills | Status: DC

## 2019-03-07 NOTE — Addendum Note (Signed)
Addended by: Delice Lesch A on: 03/07/2019 09:48 AM   Modules accepted: Orders

## 2019-03-07 NOTE — Progress Notes (Addendum)
Subjective:    Patient ID: Kenneth Pace, male    DOB: Jan 06, 1951, 69 y.o.   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   Last clinic visit on 11/01/2018.  Since that time, patient was seen by PCP, neurology, therapies-notes reviewed.  PCP referred patient for blood pressure management.  Neurology referred patient to therapies for spastic gait with continued IVIG.  Therapy is working on gait belt.  Patient states, he is making progress with therapies. He is not using a cane at present.  Denies falls. Back pain has been relatively controlled. He is using heat with benefit. He continues to use Lidoderm patch and TENS occasionally. He ran out of Celebrex and notices some increase in pain. He believes he is continuing to take Elavil and Cymbalta.   Pain Inventory Average Pain 3 Pain Right Now 2 My pain is constant and dull  In the last 24 hours, has pain interfered with the following? General activity 3 Relation with others 2 Enjoyment of life 3 What TIME of day is your pain at its worst? evening Sleep?   Good Pain is worse with: bending and some activites Pain improves with: rest, heat/ice and medication Relief from Meds: 7  Mobility walk without assistance walk with assistance use a cane how many minutes can you walk? Alessandra Bevels ability to climb steps?  yes do you drive?  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  no  Physicians involved in your care Any changes since last visit?  no   Family History  Problem Relation Age of Onset  . Dementia Mother   . Diabetes Mellitus I Mother   . Hypertension Father        68  . Healthy Sister   . Rheum arthritis Brother   .  Cancer Neg Hx   . Colon cancer Neg Hx   . Esophageal cancer Neg Hx   . Stomach cancer Neg Hx   . Rectal cancer Neg Hx   . Colon polyps Neg Hx    Social History   Socioeconomic History  . Marital status: Married    Spouse name: Not on file  . Number of children: Not on file  . Years of education: Not on file  . Highest education level: Not on file  Occupational History  . Occupation: Lobbyist: [employer]    Comment: [employer]  Tobacco Use  . Smoking status: Never Smoker  . Smokeless tobacco: Never Used  Substance and Sexual Activity  . Alcohol use: No  . Drug use: No  . Sexual activity: Not on file  Other Topics Concern  . Not on file  Social History Narrative   He works for [employer]   He lives at home with wife.     Highest level of education:  BS, business admin   Right handed   Two story home   Social Determinants of Health   Financial Resource Strain:   . Difficulty of Paying Living Expenses: Not on file  Food Insecurity:   . Worried About Charity fundraiser in the Last Year: Not on file  . Ran Out of Food in the Last  Year: Not on file  Transportation Needs:   . Lack of Transportation (Medical): Not on file  . Lack of Transportation (Non-Medical): Not on file  Physical Activity:   . Days of Exercise per Week: Not on file  . Minutes of Exercise per Session: Not on file  Stress:   . Feeling of Stress : Not on file  Social Connections:   . Frequency of Communication with Friends and Family: Not on file  . Frequency of Social Gatherings with Friends and Family: Not on file  . Attends Religious Services: Not on file  . Active Member of Clubs or Organizations: Not on file  . Attends Archivist Meetings: Not on file  . Marital Status: Not on file   Past Surgical History:  Procedure Laterality Date  . ANTERIOR CERVICAL DECOMP/DISCECTOMY FUSION N/A 02/01/2017   Procedure: ANTERIOR CERVICAL  DECOMPRESSION/DISCECTOMY FUSION CERVICAL THREE-FOUR , CERVICAL FOUR-FIVE  CERVICAL FIVE-SIX;  Surgeon: Consuella Lose, MD;  Location: [facility];  Service: Neurosurgery;  Laterality: N/A;  . COLONOSCOPY  02/05/2010   [facility]  . KNEE SURGERY     2 Lknee arthroscopy, 3 R knee arthroscpoy  . KNEE SURGERY Right 11/12/2016  . TONSILLECTOMY     Past Medical History:  Diagnosis Date  . ADENOIDECTOMY, HX OF 02/16/2007  . Anxiety    pt. states he does not have anxiety.  Marland Kitchen BENIGN PROSTATIC HYPERTROPHY, WITH OBSTRUCTION 02/03/2010  . DIABETES MELLITUS, TYPE I, UNCONTROLLED 12/29/2006  . ERECTILE DYSFUNCTION 02/16/2007  . HYPERLIPIDEMIA 02/16/2007  . HYPERTENSION 02/16/2007  . Sleep apnea    uses CPAP  . TESTICULAR MASS, LEFT 02/16/2007   BP (!) 168/91   Pulse 67   Temp (!) 97.2 F (36.2 C)   Ht 6' (1.829 m)   Wt 236 lb (107 kg)   SpO2 96%   BMI 32.01 kg/m   Opioid Risk Score:   Fall Risk Score:  `1  Depression screen PHQ 2/9  Depression screen [facility] 2/9 12/10/2017 07/07/2017 02/26/2017 02/26/2017  Decreased Interest 0 0 0 0  Down, Depressed, Hopeless 0 0 0 0  PHQ - 2 Score 0 0 0 0  Altered sleeping - - 1 -  Tired, decreased energy - - 0 -  Change in appetite - - 0 -  Feeling bad or failure about yourself  - - 0 -  Trouble concentrating - - 0 -  Moving slowly or fidgety/restless - - 1 -  Suicidal thoughts - - 0 -  PHQ-9 Score - - 2 -  Difficult doing work/chores - - Not difficult at all -  Some recent data might be hidden   Review of Systems  Constitutional: Negative.   HENT: Negative.   Eyes: Negative.   Respiratory: Negative.   Cardiovascular: Positive for leg swelling.  Gastrointestinal: Negative.   Endocrine: Negative.   Genitourinary: Negative.   Musculoskeletal: Positive for arthralgias, back pain, joint swelling and myalgias.  Skin: Negative.   Allergic/Immunologic: Negative.   Neurological: Positive for weakness and numbness.           Hematological:  Negative.   Psychiatric/Behavioral: Negative.       Objective:   Physical Exam  Constitutional: No distress . Vital signs reviewed. HENT: Normocephalic.  Atraumatic. Eyes: EOMI. No discharge. Cardiovascular: No JVD. Respiratory: Normal effort.  No stridor. GI: Non-distended. Skin: Warm and dry.  Intact. Psych: Normal mood.  Normal behavior. 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   1.  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  Cont therapies  Cont follow up Neurology -IVIG per Neurology  Cont work   2.  Pain Management:   Gabapentin and Lyrica d/ced due to edema  No benefit with Mobic, Naproxen  Cont Lidoderm patch  Cont TENS  Cont Elavil to 75 qhs   Cont Cymbalta to 60mg  with food  Cont Baclofen per Neurology  Cont Tylenol  Cont Celebrex 100 qhs - labs reviewed, LFTs WNL  3. Gait abnormality  Cont HEP  Less reliant on cane  4. Mechanical low back - multifactorial  Myalgia +/- facet arthropathy  MRI L-spine reviewed, multilevel predominantly foraminal and facet arthropathy  Cont Balcofen per Neurology  Cont Cymbalta   Will consider facet injections in future if necessary, patient would like to hold off  Note written for gym participation - discussed safety, but unable to go because it is still closed   5. Myalgia  Good benefit with TP injections  He would like to see how he improves with therapies and consider future injections at that time.  See #1, #4  6. Spasticity  See #1,2,3  Cont Baclofen  Cont therapies  Will  consider further intervention after therapies  7. Essential HTN  Follow up with PCP

## 2019-03-09 ENCOUNTER — Other Ambulatory Visit: Payer: Self-pay

## 2019-03-09 ENCOUNTER — Ambulatory Visit (INDEPENDENT_AMBULATORY_CARE_PROVIDER_SITE_OTHER): Payer: BC Managed Care – PPO | Admitting: Physical Therapy

## 2019-03-09 ENCOUNTER — Encounter: Payer: Self-pay | Admitting: Physical Therapy

## 2019-03-09 DIAGNOSIS — R2689 Other abnormalities of gait and mobility: Secondary | ICD-10-CM | POA: Diagnosis not present

## 2019-03-09 DIAGNOSIS — M6281 Muscle weakness (generalized): Secondary | ICD-10-CM

## 2019-03-09 DIAGNOSIS — R2681 Unsteadiness on feet: Secondary | ICD-10-CM

## 2019-03-09 NOTE — Therapy (Signed)
[facility] [place], [place], [address] Phone: [phone]   Fax:  [phone]  Physical Therapy Treatment  Patient Details  Name: Kenneth Pace MRN: [id number] Date of Birth: 1951/01/14 Referring Provider (PT): Narda Amber, DO   Encounter Date: 03/09/2019  PT End of Session - 03/09/19 0855    Visit Number  9    Number of Visits  12    Date for PT Re-Evaluation  03/24/19    PT Start Time  0848    PT Stop Time  0930    PT Time Calculation (min)  42 min       Past Medical History:  Diagnosis Date  . ADENOIDECTOMY, HX OF 02/16/2007  . Anxiety    pt. states he does not have anxiety.  Marland Kitchen BENIGN PROSTATIC HYPERTROPHY, WITH OBSTRUCTION 02/03/2010  . DIABETES MELLITUS, TYPE I, UNCONTROLLED 12/29/2006  . ERECTILE DYSFUNCTION 02/16/2007  . HYPERLIPIDEMIA 02/16/2007  . HYPERTENSION 02/16/2007  . Sleep apnea    uses CPAP  . TESTICULAR MASS, LEFT 02/16/2007    Past Surgical History:  Procedure Laterality Date  . ANTERIOR CERVICAL DECOMP/DISCECTOMY FUSION N/A 02/01/2017   Procedure: ANTERIOR CERVICAL DECOMPRESSION/DISCECTOMY FUSION CERVICAL THREE-FOUR , CERVICAL FOUR-FIVE  CERVICAL FIVE-SIX;  Surgeon: Consuella Lose, MD;  Location: [facility];  Service: Neurosurgery;  Laterality: N/A;  . COLONOSCOPY  02/05/2010   [facility]  . KNEE SURGERY     2 Lknee arthroscopy, 3 R knee arthroscpoy  . KNEE SURGERY Right 11/12/2016  . TONSILLECTOMY      There were no vitals filed for this visit.  Subjective Assessment - 03/09/19 0857    Subjective  Pt reports he is pleased with progress so far.  He states he did some exercises last night and his back is "angry" with him this morning.    Pertinent History  Guillian Barre, Cervical fusion due to cervical myelopathy (end of 2018), R knee surgery    Patient Stated Goals  improve flexibility, improve balance (stiffness in legs)    Currently in Pain?  Yes    Pain Score   3     Pain Location  Back    Pain Orientation  Right;Left    Pain Descriptors / Indicators  Sore    Aggravating Factors   overdoing it    Pain Relieving Factors  heating pad         [facility] PT Assessment - 03/09/19 0001      Assessment   Medical Diagnosis  CIDP    Referring Provider (PT)  Narda Amber, DO    Onset Date/Surgical Date  --   2018   Hand Dominance  Right    Prior Therapy  known to our clinic from prior physical therapy       [facility] Adult PT Treatment/Exercise - 03/09/19 0001      Neuro Re-ed    Neuro Re-ed Details   [facility] tapping for right/left weight shifting and turning 180 degrees while stepping forward/back and over obstacles.  Dynamic balance tasks for replication of functional tasks and working on proactive and reactive balance strategies.  Tall kneeling to 1/2 kneeling working on rhythmic stabilization with variable UE resistance and release for postural stabilization.      Lumbar Exercises: Stretches   Lower Trunk Rotation  3 reps;10 seconds   arms in T   Hip Flexor Stretch  Right;Left;2 reps;10 seconds   1/2 kneeling, UE support   Standing Extension  10 seconds;3 reps  Other Lumbar Stretch Exercise  quadruped thoracic rotation with hand on hip x 5 reps each side;  modified childs pose x 3 reps       Lumbar Exercises: Quadruped   Other Quadruped Lumbar Exercises  high kneeling with wood chop motion with 4# ball x 8 reps each side      Knee/Hip Exercises: Stretches   Press photographer  Both;2 reps;20 seconds      Knee/Hip Exercises: Aerobic   Nustep  L5 x 5 minutes      Balance Exercises - 03/09/19 1329      Balance Exercises: Standing   Stepping Strategy  Posterior;5 reps    Rockerboard  Anterior/posterior;30 seconds;EO;Intermittent UE support    Balance Beam  1/2 foam roller flat side down, tandem stance x 30 sec x 2 reps each foot forward with intermittent UE support.           PT Short Term Goals - 02/16/19 1400      PT SHORT TERM GOAL #1    Title  n/a        PT Long Term Goals - 03/02/19 0931      PT LONG TERM GOAL #1   Title  The patient will be indep with HEP.    Time  6    Period  Weeks      PT LONG TERM GOAL #2   Title  The patient will improve Berg from 49/56 to > or equal to 52/56 to demo improving static balance.    Baseline  54/56.    Time  6    Period  Weeks    Status  Achieved      PT LONG TERM GOAL #3   Title  The patient will improve 5 time sit<>stand from 18.60 seconds to < or equal to 14 seconds to demo improving mobility.    Time  6    Period  Weeks      PT LONG TERM GOAL #4   Title  The patient will move floor<>stand with UE support due to h/o falls.    Time  6    Period  Weeks      PT LONG TERM GOAL #5   Title  The patient will have gait speed measured and improve 0.5 ft/sec (0.46m/s) to demo improving functional mobility.    Baseline  2.97 ft/sec  *Patient is walking at full community ambulator classification of gait.  No goal indicated at this time.    Time  6    Period  Weeks    Status  Deferred            Plan - 03/09/19 1332    Clinical Impression Statement  Despite reported elevated pain level in low back, pt participated well throughout session.  Pt required a few short seated rest breaks in between balance exercises due to fatigue. Continues with some balance deficits in high / half kneeling position and with Lt stance with Rt leg moving in standing.  Pt making good gains towards remaining goals.    Comorbidities  diabetes, h/o LBP, guillian barre, cervical myelopathy    Rehab Potential  Good    PT Frequency  2x / week    PT Duration  6 weeks    PT Treatment/Interventions  ADLs/Self Care Home Management;Neuromuscular re-education;Balance training;Gait training;Stair training;Functional mobility training;Therapeutic activities;Therapeutic exercise;Moist Heat;Patient/family education;Manual techniques;Taping    PT Next Visit Plan  10th visit; assess goals.    Consulted and  Agree with Plan  of Care  Patient       Patient will benefit from skilled therapeutic intervention in order to improve the following deficits and impairments:  Abnormal gait, Difficulty walking, Decreased balance, Decreased strength, Decreased mobility, Impaired flexibility, Pain, Impaired tone, Decreased coordination  Visit Diagnosis: Muscle weakness (generalized)  Unsteadiness on feet  Other abnormalities of gait and mobility     Problem List Patient Active Problem List   Diagnosis Date Noted  . Spastic gait 03/07/2019  . Myalgia 11/01/2018  . Chronic bilateral low back pain without sciatica 07/21/2018  . 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 CIDP (chronic inflammatory demyelinating polyneuropathy) ([place]) 12/30/2017  . Edema 03/01/2017  . Hypokalemia   . Slow transit constipation   . Steroid-induced hyperglycemia   . Urinary retention   . Accidental drug overdose   . Postoperative pain   . Hyponatremia   . Cervical myelopathy ([place]) 02/01/2017  . Shortness of breath at rest   . Hypoglycemia   . Spondylosis, cervical, with myelopathy   . Neuropathic pain   . Benign essential HTN   . Labile blood glucose   . Muscle spasm   . GAD (generalized anxiety disorder)   . Acute inflammatory demyelinating polyneuropathy ([place]) 01/11/2017  . Anxiety state   . Neurogenic bladder   . Depression 12/30/2016  . Leg weakness, bilateral 12/30/2016  . 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 GBS (Guillain Barre syndrome) ([place])   . AIDP (acute inflammatory demyelinating polyneuropathy) ([place]) 11/27/2016  . Weakness 11/20/2016  . Weakness of both arms 10/30/2016  . 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 Obstructive sleep apnea 03/24/2016  . Numbness 03/18/2016  . Mild nonproliferative diabetic retinopathy of both eyes without macular edema associated with type 2 diabetes mellitus ([place]) 05/24/2015  . Nuclear cataract of both eyes  05/24/2015  . Diabetes mellitus without complication ([place]) [id number]  . 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 Eustachian tube dysfunction 02/12/2015  . Acute maxillary sinusitis 02/12/2015  . Wellness examination 08/22/2014  . Alkaline phosphatase elevation 08/22/2014  . 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 Benign non-nodular prostatic hyperplasia with lower urinary tract symptoms 01/31/2014  . Lesion of eyelid 09/26/2013  . Screening for prostate cancer 04/24/2013  . Diabetes ([place]) 06/16/2012  . ED (erectile dysfunction) of organic origin 06/03/2012  . Routine general medical examination at a health care facility 04/10/2012  . BPH (benign prostatic hyperplasia) 02/03/2010  . HEMOCCULT POSITIVE STOOL 05/25/2008  . ELBOW PAIN, RIGHT 07/04/2007  . Dyslipidemia 02/16/2007  . ANXIETY 02/16/2007  . ERECTILE DYSFUNCTION 02/16/2007  . Essential hypertension 02/16/2007  . TESTICULAR MASS, LEFT 02/16/2007  . KNEE PAIN, CHRONIC 02/16/2007  . Michell Heinrich OF 02/16/2007   Kerin Perna, PTA 03/09/19 1:35 PM   [facility] [address] [place], [place], [address] Phone: [phone]   Fax:  [phone]  Name: TORONTO TRABERT MRN: [id number] Date of Birth: Nov 09, 1950

## 2019-03-09 NOTE — Patient Instructions (Signed)
Supine to Sit (Active)    Lie on back, left leg bent. Roll to other side. From side-lying, sit up on side of bed.

## 2019-03-13 ENCOUNTER — Encounter: Payer: Self-pay | Admitting: Rehabilitative and Restorative Service Providers"

## 2019-03-13 ENCOUNTER — Other Ambulatory Visit: Payer: Self-pay

## 2019-03-13 ENCOUNTER — Ambulatory Visit (INDEPENDENT_AMBULATORY_CARE_PROVIDER_SITE_OTHER): Payer: BC Managed Care – PPO | Admitting: Rehabilitative and Restorative Service Providers"

## 2019-03-13 DIAGNOSIS — R2689 Other abnormalities of gait and mobility: Secondary | ICD-10-CM | POA: Diagnosis not present

## 2019-03-13 DIAGNOSIS — R2681 Unsteadiness on feet: Secondary | ICD-10-CM

## 2019-03-13 DIAGNOSIS — M6281 Muscle weakness (generalized): Secondary | ICD-10-CM | POA: Diagnosis not present

## 2019-03-13 NOTE — Patient Instructions (Signed)
Access Code: [email or web address]  URL: [email or web address]  Date: 03/13/2019  Prepared by: Rudell Cobb   Exercises Supine Piriformis Stretch with Foot on Ground - 3 reps - 1 sets - 20 seconds hold - 2x daily - 7x weekly Sidelying Thoracic Lumbar Rotation - 5 reps - 1 sets - 10 hold - 2x daily - 7x weekly Doorway Pec Stretch at 90 Degrees Abduction - 2-3 reps - 1 sets - 20 hold - 2x daily - 7x weekly Gastroc Stretch on Step - 2-3 reps - 1 sets - 20 hold - 2x daily - 7x weekly Half Kneeling Dorsiflexion Stretch at Wall - 2-3 reps - 1 sets - 20 hold - 2x daily - 7x weekly Seated Hamstring Stretch - 2-3 reps - 1 sets - 20 seconds hold - 2x daily - 7x weekly Backward Walking with Counter Support - 10 reps - 1 sets - 2x daily - 7x weekly Side Stepping with Counter Support - 10 reps - 1 sets - 2x daily - 7x weekly

## 2019-03-13 NOTE — Therapy (Signed)
[facility] [place], [place], [address] Phone: [phone]   Fax:  [phone]  Physical Therapy Treatment and 10th visit progress note  Patient Details  Name: Kenneth Pace MRN: [id number] Date of Birth: August 28, 1950 Referring Provider (PT): Narda Amber, DO   Encounter Date: 03/13/2019  PT End of Session - 03/13/19 0924    Visit Number  10    Number of Visits  12    Date for PT Re-Evaluation  03/24/19    PT Start Time  0848    PT Stop Time  0930    PT Time Calculation (min)  42 min    Activity Tolerance  Patient tolerated treatment well    Behavior During Therapy  [facility] for tasks assessed/performed       Past Medical History:  Diagnosis Date  . ADENOIDECTOMY, HX OF 02/16/2007  . Anxiety    pt. states he does not have anxiety.  Marland Kitchen BENIGN PROSTATIC HYPERTROPHY, WITH OBSTRUCTION 02/03/2010  . DIABETES MELLITUS, TYPE I, UNCONTROLLED 12/29/2006  . ERECTILE DYSFUNCTION 02/16/2007  . HYPERLIPIDEMIA 02/16/2007  . HYPERTENSION 02/16/2007  . Sleep apnea    uses CPAP  . TESTICULAR MASS, LEFT 02/16/2007    Past Surgical History:  Procedure Laterality Date  . ANTERIOR CERVICAL DECOMP/DISCECTOMY FUSION N/A 02/01/2017   Procedure: ANTERIOR CERVICAL DECOMPRESSION/DISCECTOMY FUSION CERVICAL THREE-FOUR , CERVICAL FOUR-FIVE  CERVICAL FIVE-SIX;  Surgeon: Consuella Lose, MD;  Location: [facility];  Service: Neurosurgery;  Laterality: N/A;  . COLONOSCOPY  02/05/2010   [facility]  . KNEE SURGERY     2 Lknee arthroscopy, 3 R knee arthroscpoy  . KNEE SURGERY Right 11/12/2016  . TONSILLECTOMY      There were no vitals filed for this visit.  Subjective Assessment - 03/13/19 0851    Subjective  The patient reports he uses TENS unit if he is really hurting.  He is stretching and notes stiffness when first rising.    Pertinent History  Guillian Barre, Cervical fusion due to cervical myelopathy (end of 2018), R knee  surgery    Patient Stated Goals  improve flexibility, improve balance (stiffness in legs)    Currently in Pain?  Yes    Pain Score  3    dull sensation   Pain Location  Back    Pain Orientation  Lower    Pain Descriptors / Indicators  Dull    Pain Type  Chronic pain    Pain Onset  More than a month ago    Pain Frequency  Constant    Aggravating Factors   overdoing it    Pain Relieving Factors  heating pad, stretching         [facility] PT Assessment - 03/13/19 0901      Transfers   Transfers  Sit to Stand;Stand to Sit    Sit to Stand  7: Independent    Five time sit to stand comments   11.38 seconds    Stand to Sit  7: Independent      Ambulation/Gait   Ambulation/Gait  Yes    Ambulation/Gait Assistance  6: Modified independent (Device/Increase time)    Assistive device  None                   [facility] Adult PT Treatment/Exercise - 03/13/19 0921      Transfers   Transfers  Sit to Stand;Stand to Sit    Sit to Stand  7: Independent    Five time  sit to stand comments   11.38 seconds    Stand to Sit  7: Independent      Ambulation/Gait   Ambulation/Gait  Yes    Ambulation/Gait Assistance  6: Modified independent (Device/Increase time)    Assistive device  None      Therapeutic Activites    Therapeutic Activities  Other Therapeutic Activities    Other Therapeutic Activities  floor<>stand with UE support through mat table and also with using floor for support.  Patient is able to perform mod indep (does better with finger tip touch on surface other than floor like a chair or wall)      Neuro Re-ed    Neuro Re-ed Details   Multi-directional steps performing backwards walking, sidestepping for balance recovery.  Standing in stride rocking anterior/posteriorly with UE reaching, lateral weight shifting and rocking.      Exercises   Exercises  Lumbar      Lumbar Exercises: Stretches   Active Hamstring Stretch  Right;Left;2 reps;30 seconds    Prone on Elbows Stretch   60 seconds;1 rep    Quad Stretch  Right;Left;30 seconds;1 rep    Piriformis Stretch  Right;Left;30 seconds             PT Education - 03/13/19 0918    Education Details  modified HEP    Person(s) Educated  Patient    Methods  Explanation;Demonstration;Handout    Comprehension  Returned demonstration;Verbalized understanding       PT Short Term Goals - 02/16/19 1400      PT SHORT TERM GOAL #1   Title  n/a        PT Long Term Goals - 03/13/19 0856      PT LONG TERM GOAL #1   Title  The patient will be indep with HEP.    Time  6    Period  Weeks      PT LONG TERM GOAL #2   Title  The patient will improve Berg from 49/56 to > or equal to 52/56 to demo improving static balance.    Baseline  54/56.    Time  6    Period  Weeks    Status  Achieved      PT LONG TERM GOAL #3   Title  The patient will improve 5 time sit<>stand from 18.60 seconds to < or equal to 14 seconds to demo improving mobility.    Baseline  11.68 seconds    Time  6    Period  Weeks    Status  Achieved      PT LONG TERM GOAL #4   Title  The patient will move floor<>stand with UE support due to h/o falls.    Baseline  can do without UE support, however safer with fingertip touch on a surface    Time  6    Period  Weeks    Status  Achieved      PT LONG TERM GOAL #5   Title  The patient will have gait speed measured and improve 0.5 ft/sec (0.46ms) to demo improving functional mobility.    Baseline  2.97 ft/sec  *Patient is walking at full community ambulator classification of gait.  No goal indicated at this time.    Time  6    Period  Weeks    Status  Deferred            Plan - 03/13/19 04401   Clinical Impression Statement  The patient has  met 4/5 LTGs at this time.  We are scheduled for 1-2 more visits to continue to progress HEP ensuring a comprehensive program to continue until able to return to the gym.    Comorbidities  diabetes, h/o LBP, guillian barre, cervical myelopathy     Rehab Potential  Good    PT Frequency  2x / week    PT Duration  6 weeks    PT Treatment/Interventions  ADLs/Self Care Home Management;Neuromuscular re-education;Balance training;Gait training;Stair training;Functional mobility training;Therapeutic activities;Therapeutic exercise;Moist Heat;Patient/family education;Manual techniques;Taping    PT Next Visit Plan  Check HEP ensuring balance components going well, modify and plan for d/c.    PT Home Exercise Plan  Access Code: [place]    Consulted and Agree with Plan of Care  Patient       Patient will benefit from skilled therapeutic intervention in order to improve the following deficits and impairments:  Abnormal gait, Difficulty walking, Decreased balance, Decreased strength, Decreased mobility, Impaired flexibility, Pain, Impaired tone, Decreased coordination  Visit Diagnosis: Muscle weakness (generalized)  Unsteadiness on feet  Other abnormalities of gait and mobility    Physical Therapy Progress Note   Dates of Reporting Period: 12//22/2020 to 03/13/2019   Objective Measurements: see goals above for objective measurements  Goal Update: see above    Reason Skilled Services are Required: PT progressing HEP to address flexibility and mobility issues related to increased tone and imbalance.  Plan to d/c within next 2 sessions with home program until able to access community wellness/ gym environment.   Thank you for the referral of this patient. Rudell Cobb, MPT     [place] , PT 03/13/2019, 1:34 PM  [facility] [place] [place], [place], [address] Phone: [phone]   Fax:  [phone]  Name: KEYLAN COSTABILE MRN: [id number] Date of Birth: 10/03/1950

## 2019-03-14 ENCOUNTER — Encounter: Payer: BC Managed Care – PPO | Admitting: Family Medicine

## 2019-03-14 DIAGNOSIS — G5632 Lesion of radial nerve, left upper limb: Secondary | ICD-10-CM | POA: Diagnosis not present

## 2019-03-14 DIAGNOSIS — M19022 Primary osteoarthritis, left elbow: Secondary | ICD-10-CM | POA: Diagnosis not present

## 2019-03-14 DIAGNOSIS — M899 Disorder of bone, unspecified: Secondary | ICD-10-CM | POA: Diagnosis not present

## 2019-03-14 DIAGNOSIS — M889 Osteitis deformans of unspecified bone: Secondary | ICD-10-CM | POA: Diagnosis not present

## 2019-03-14 DIAGNOSIS — M25522 Pain in left elbow: Secondary | ICD-10-CM | POA: Diagnosis not present

## 2019-03-16 ENCOUNTER — Encounter: Payer: BC Managed Care – PPO | Admitting: Rehabilitative and Restorative Service Providers"

## 2019-03-20 ENCOUNTER — Ambulatory Visit (INDEPENDENT_AMBULATORY_CARE_PROVIDER_SITE_OTHER): Payer: BC Managed Care – PPO | Admitting: Rehabilitative and Restorative Service Providers"

## 2019-03-20 ENCOUNTER — Other Ambulatory Visit: Payer: Self-pay

## 2019-03-20 DIAGNOSIS — G610 Guillain-Barre syndrome: Secondary | ICD-10-CM | POA: Diagnosis not present

## 2019-03-20 DIAGNOSIS — M6281 Muscle weakness (generalized): Secondary | ICD-10-CM | POA: Diagnosis not present

## 2019-03-20 DIAGNOSIS — R2681 Unsteadiness on feet: Secondary | ICD-10-CM | POA: Diagnosis not present

## 2019-03-20 DIAGNOSIS — R2689 Other abnormalities of gait and mobility: Secondary | ICD-10-CM

## 2019-03-20 NOTE — Patient Instructions (Addendum)
Access Code: [email or web address]  URL: [email or web address]  Date: 03/20/2019  Prepared by: Rudell Cobb   Exercises Supine Piriformis Stretch with Foot on Ground - 3 reps - 1 sets - 20 seconds hold - 2x daily - 7x weekly Supine Lower Trunk Rotation - 10 reps - 1 sets - 2x daily - 7x weekly Doorway Pec Stretch at 90 Degrees Abduction - 2-3 reps - 1 sets - 20 hold - 2x daily - 7x weekly Gastroc Stretch on Step - 2-3 reps - 1 sets - 20 hold - 2x daily - 7x weekly Backward Walking with Counter Support - 10 reps - 1 sets - 2x daily - 7x weekly Seated Hamstring Stretch - 2-3 reps - 1 sets - 20 seconds hold - 2x daily - 7x weekly Seated Hip Flexor Stretch - 2 reps - 1 sets - 30 seconds hold - 2x daily - 7x weekly Side Stepping with Counter Support - 10 reps - 1 sets - 2x daily - 7x weekly

## 2019-03-20 NOTE — Therapy (Signed)
[place], [place], [address] Phone: [phone]   Fax:  [phone]  Physical Therapy Treatment  Patient Details  Name: Kenneth Pace MRN: [id number] Date of Birth: [birth year] Referring Provider (PT): Narda Amber, DO   Encounter Date: 03/20/2019  PT End of Session - 03/20/19 0849    Visit Number  11    Number of Visits  12    Date for PT Re-Evaluation  03/24/19    PT Start Time  0830    PT Stop Time  0915    PT Time Calculation (min)  45 min    Equipment Utilized During Treatment  Gait belt    Activity Tolerance  Patient tolerated treatment well    Behavior During Therapy  [facility] for tasks assessed/performed       Past Medical History:  Diagnosis Date  . ADENOIDECTOMY, HX OF 02/16/2007  . Anxiety    pt. states he does not have anxiety.  Marland Kitchen BENIGN PROSTATIC HYPERTROPHY, WITH OBSTRUCTION 02/03/2010  . DIABETES MELLITUS, TYPE I, UNCONTROLLED 12/29/2006  . ERECTILE DYSFUNCTION 02/16/2007  . HYPERLIPIDEMIA 02/16/2007  . HYPERTENSION 02/16/2007  . Sleep apnea    uses CPAP  . TESTICULAR MASS, LEFT 02/16/2007    Past Surgical History:  Procedure Laterality Date  . ANTERIOR CERVICAL DECOMP/DISCECTOMY FUSION N/A 02/01/2017   Procedure: ANTERIOR CERVICAL DECOMPRESSION/DISCECTOMY FUSION CERVICAL THREE-FOUR , CERVICAL FOUR-FIVE  CERVICAL FIVE-SIX;  Surgeon: Consuella Lose, MD;  Location: [facility];  Service: Neurosurgery;  Laterality: N/A;  . COLONOSCOPY  02/05/2010   [facility]  . KNEE SURGERY     2 Lknee arthroscopy, 3 R knee arthroscpoy  . KNEE SURGERY Right 11/12/2016  . TONSILLECTOMY      There were no vitals filed for this visit.  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  He is scheduled to undergo testing at cancer center to ensure no malignant process (appt  tomorrow).  He stretched over the weekend.   He had a loss of balance when his dog jumped on him having to lean into a wall.    Pertinent History  Guillian Barre, Cervical fusion due to cervical myelopathy (end of 2018), R knee surgery    Patient Stated Goals  improve flexibility, improve balance (stiffness in legs)    Currently in Pain?  Yes    Pain Score  2     Pain Location  Back    Pain Orientation  Lower    Pain Descriptors / Indicators  Dull    Pain Type  Chronic pain    Pain Onset  More than a month ago    Pain Frequency  Constant    Aggravating Factors   overdoing it    Pain Relieving Factors  heating pad, stretching                       [facility] Adult PT Treatment/Exercise - 03/20/19 0859      Ambulation/Gait   Ambulation/Gait  Yes    Ambulation/Gait Assistance  6: Modified independent (Device/Increase time)    Assistive device  None    Gait Comments  Gait x 150 feet x 3 reps in between exercises with cues for lengthening stride and knee flexion.      Self-Care   Self-Care  Other Self-Care Comments    Other Self-Care Comments  discussed tone mgmt, temperatures impact on rigidity and continued stretching post d/c.      Neuro Re-ed    Neuro Re-ed Details   Backwards walking 5 reps along countertop x 10 feet with supervision, marching x 10 feet x 5 reps with high knees, sidestepping progressing to braiding (with CGA) 10 feet x 5 reps R and L sides.        Exercises   Exercises  Lumbar      Lumbar Exercises: Stretches   Active Hamstring Stretch  Right;1 rep;2 reps;30 seconds    Piriformis Stretch  Right;Left;2 reps;30 seconds    Other Lumbar Stretch Exercise  hip flexor stretch standing (feels in calf muscle), then seated in chair without armrests *modified for HEP    Other Lumbar Stretch Exercise  lumbar rotation supine R and L; also performed sidelying thoracic opening-- patient is doing this as lumbar rotation at home.               PT Short  Term Goals - 02/16/19 1400      PT SHORT TERM GOAL #1   Title  n/a        PT Long Term Goals - 03/20/19 0914      PT LONG TERM GOAL #1   Title  The patient will be indep with HEP.    Time  6    Period  Weeks    Target Date  03/24/19      PT LONG TERM GOAL #2   Title  The patient will improve Berg from 49/56 to > or equal to 52/56 to demo improving static balance.    Baseline  54/56.    Time  6    Period  Weeks    Status  Achieved      PT LONG TERM GOAL #3   Title  The patient will improve 5 time sit<>stand from 18.60 seconds to < or equal to 14 seconds to demo improving mobility.    Baseline  11.68 seconds    Time  6    Period  Weeks    Status  Achieved      PT LONG TERM GOAL #4   Title  The patient will move floor<>stand with UE support due to h/o falls.    Baseline  can do without UE support, however safer with fingertip touch on a surface    Time  6    Period  Weeks    Status  Achieved      PT LONG TERM GOAL #5   Title  The patient will have gait speed measured and improve 0.5 ft/sec (0.4m/s) to demo improving functional mobility.    Baseline  2.97 ft/sec  *Patient is walking at full community ambulator classification of gait.  No goal indicated at this time.    Time  6    Period  Weeks    Status  Deferred            Plan - 03/20/19 0932    Clinical Impression Statement  The patient is continuing with HEP for stretching due to chronic deficits of muscle spasticity and balance activities for mobility.  The patient has a comprehensive HEP.  He will continue to work on HEP and f/u for one visit in PT to ensure going well.  He notes spasticity worse in cold weather.    Comorbidities  diabetes, h/o LBP, guillian barre, cervical myelopathy    Rehab Potential  Good    PT Frequency  2x / week    PT Duration  6 weeks    PT Treatment/Interventions  ADLs/Self Care Home Management;Neuromuscular re-education;Balance training;Gait training;Stair training;Functional  mobility training;Therapeutic activities;Therapeutic exercise;Moist Heat;Patient/family education;Manual techniques;Taping    PT Next Visit Plan  d/c next visit.  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 Spastic gait 03/07/2019  . Myalgia 11/01/2018  . Chronic bilateral low back pain without sciatica 07/21/2018  . 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 Edema 03/01/2017  . Hypokalemia   . Slow transit constipation   . Steroid-induced hyperglycemia   . Urinary retention   . Accidental drug overdose   . Postoperative pain   . Hyponatremia   . Cervical myelopathy ([place]) 02/01/2017  . Shortness of breath at rest   . Hypoglycemia   . Spondylosis, cervical, with myelopathy   . Neuropathic pain   . Benign essential HTN   . Labile blood glucose   . Muscle spasm   . GAD (generalized anxiety disorder)   . 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 Anxiety state   . Neurogenic bladder   . Depression 12/30/2016  . Leg weakness, bilateral 12/30/2016  . 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 GBS (Guillain Barre syndrome) ([place])   . AIDP (acute inflammatory demyelinating polyneuropathy) ([place]) 11/27/2016  . Weakness 11/20/2016  . Weakness  of both arms 10/30/2016  . 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 Obstructive sleep apnea 03/24/2016  . Numbness 03/18/2016  . Mild nonproliferative diabetic retinopathy of both eyes without macular edema associated with type 2 diabetes mellitus ([place]) 05/24/2015  . Nuclear cataract of both eyes 05/24/2015  . Diabetes mellitus without complication ([place]) [id number]  . 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 Eustachian tube dysfunction 02/12/2015  . Acute maxillary sinusitis 02/12/2015  . Wellness examination 08/22/2014  . Alkaline phosphatase elevation 08/22/2014  . 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 Benign non-nodular prostatic hyperplasia with lower urinary tract symptoms 01/31/2014  . Lesion of eyelid 09/26/2013  . Screening for prostate cancer 04/24/2013  . Diabetes ([place]) 06/16/2012  . ED (erectile dysfunction) of organic origin 06/03/2012  . Routine general medical examination at a health care facility 04/10/2012  . BPH (benign prostatic hyperplasia) 02/03/2010  . HEMOCCULT POSITIVE STOOL 05/25/2008  . ELBOW PAIN, RIGHT 07/04/2007  . Dyslipidemia 02/16/2007  . ANXIETY 02/16/2007  . ERECTILE DYSFUNCTION 02/16/2007  . Essential hypertension 02/16/2007  . TESTICULAR MASS, LEFT 02/16/2007  . KNEE PAIN, CHRONIC 02/16/2007  . Michell Heinrich OF 02/16/2007    [place], PT 03/20/2019, 10:58 AM  [facility] [place], [place], [address] Phone: [phone]   Fax:  [phone]  Name: ASHE KOPPER MRN: [id number] Date of Birth: Nov 28, 1950

## 2019-03-21 DIAGNOSIS — M889 Osteitis deformans of unspecified bone: Secondary | ICD-10-CM | POA: Diagnosis not present

## 2019-03-22 ENCOUNTER — Other Ambulatory Visit: Payer: Self-pay

## 2019-03-24 ENCOUNTER — Other Ambulatory Visit: Payer: Self-pay

## 2019-03-24 ENCOUNTER — Encounter: Payer: Self-pay | Admitting: Family Medicine

## 2019-03-24 ENCOUNTER — Telehealth: Payer: Self-pay | Admitting: Rehabilitative and Restorative Service Providers"

## 2019-03-24 ENCOUNTER — Ambulatory Visit (INDEPENDENT_AMBULATORY_CARE_PROVIDER_SITE_OTHER): Payer: BC Managed Care – PPO | Admitting: Family Medicine

## 2019-03-24 VITALS — BP 146/76 | HR 73 | Temp 97.3°F | Resp 18 | Ht 72.0 in | Wt 237.2 lb

## 2019-03-24 DIAGNOSIS — Z0000 Encounter for general adult medical examination without abnormal findings: Secondary | ICD-10-CM

## 2019-03-24 DIAGNOSIS — Z125 Encounter for screening for malignant neoplasm of prostate: Secondary | ICD-10-CM | POA: Diagnosis not present

## 2019-03-24 DIAGNOSIS — E1065 Type 1 diabetes mellitus with hyperglycemia: Secondary | ICD-10-CM

## 2019-03-24 LAB — CBC
HCT: 47.2 % (ref 39.0–52.0)
Hemoglobin: 16.2 g/dL (ref 13.0–17.0)
MCHC: 34.3 g/dL (ref 30.0–36.0)
MCV: 88.4 fl (ref 78.0–100.0)
Platelets: 211 10*3/uL (ref 150.0–400.0)
RBC: 5.34 Mil/uL (ref 4.22–5.81)
RDW: 13.8 % (ref 11.5–15.5)
WBC: 7 10*3/uL (ref 4.0–10.5)

## 2019-03-24 LAB — LIPID PANEL
Cholesterol: 218 mg/dL — ABNORMAL HIGH (ref 0–200)
HDL: 31 mg/dL — ABNORMAL LOW (ref >39.00)
NonHDL: 186.62
Total CHOL/HDL Ratio: 7
Triglycerides: 265 mg/dL — ABNORMAL HIGH (ref 0.0–149.0)
VLDL: 53 mg/dL — ABNORMAL HIGH (ref 0.0–40.0)

## 2019-03-24 LAB — COMPREHENSIVE METABOLIC PANEL
ALT: 19 U/L (ref 0–53)
AST: 16 U/L (ref 0–37)
Albumin: 4 g/dL (ref 3.5–5.2)
Alkaline Phosphatase: 237 U/L — ABNORMAL HIGH (ref 39–117)
BUN: 21 mg/dL (ref 6–23)
CO2: 29 mEq/L (ref 19–32)
Calcium: 9.1 mg/dL (ref 8.4–10.5)
Chloride: 102 mEq/L (ref 96–112)
Creatinine, Ser: 0.86 mg/dL (ref 0.40–1.50)
GFR: 88.8 mL/min (ref >60.00)
Glucose, Bld: 173 mg/dL — ABNORMAL HIGH (ref 70–99)
Potassium: 3.9 mEq/L (ref 3.5–5.1)
Sodium: 138 mEq/L (ref 135–145)
Total Bilirubin: 0.6 mg/dL (ref 0.2–1.2)
Total Protein: 7.3 g/dL (ref 6.0–8.3)

## 2019-03-24 LAB — HEMOGLOBIN A1C: Hgb A1c MFr Bld: 8.8 % — ABNORMAL HIGH (ref 4.6–6.5)

## 2019-03-24 LAB — PSA: PSA: 1.4 ng/mL (ref 0.10–4.00)

## 2019-03-24 LAB — LDL CHOLESTEROL, DIRECT: Direct LDL: 147 mg/dL

## 2019-03-24 MED ORDER — LIDOCAINE 5 % EX PTCH: MEDICATED_PATCH | CUTANEOUS | 2 refills | Status: DC

## 2019-03-24 MED ORDER — SCOPOLAMINE 1 MG/3DAYS TD PT72: 1.0000 | MEDICATED_PATCH | TRANSDERMAL | 12 refills | Status: DC

## 2019-03-24 NOTE — Telephone Encounter (Signed)
PT called Dr. Louretta Parma office per patient request to have L elbow pain addressed at our clinic.  He arrived at last visit with OT order for PIN syndrome.    Patient spoke with nurse's line assistant to inquire if this could be addressed by PT and to submit order if appropriate.  Patient already undergoing episode of care referred from neurologist and would like to remain at our clinic.  Alayne Estrella, PT

## 2019-03-24 NOTE — Progress Notes (Signed)
Chief Complaint  Patient presents with  . Annual Exam    Well Male Kenneth Pace is here for a complete physical.   His last physical was >1 year ago.  Current diet: in general, a "healthy" diet.   Current exercise: Working w therapy Weight trend: stable Daytime fatigue? No. Seat belt? Yes.    Health maintenance Shingrix- Yes Colonoscopy- Yes Tetanus- Yes Hep C- Yes Pneumonia vaccine- Due for PCV23  Past Medical History:  Diagnosis Date  . ADENOIDECTOMY, HX OF 02/16/2007  . Anxiety    pt. states he does not have anxiety.  Marland Kitchen BENIGN PROSTATIC HYPERTROPHY, WITH OBSTRUCTION 02/03/2010  . DIABETES MELLITUS, TYPE I, UNCONTROLLED 12/29/2006  . ERECTILE DYSFUNCTION 02/16/2007  . HYPERLIPIDEMIA 02/16/2007  . HYPERTENSION 02/16/2007  . Sleep apnea    uses CPAP  . TESTICULAR MASS, LEFT 02/16/2007    Past Surgical History:  Procedure Laterality Date  . ANTERIOR CERVICAL DECOMP/DISCECTOMY FUSION N/A 02/01/2017   Procedure: ANTERIOR CERVICAL DECOMPRESSION/DISCECTOMY FUSION CERVICAL THREE-FOUR , CERVICAL FOUR-FIVE  CERVICAL FIVE-SIX;  Surgeon: Consuella Lose, MD;  Location: [facility];  Service: Neurosurgery;  Laterality: N/A;  . COLONOSCOPY  02/05/2010   [facility]  . KNEE SURGERY     2 Lknee arthroscopy, 3 R knee arthroscpoy  . KNEE SURGERY Right 11/12/2016  . TONSILLECTOMY      Medications  Current Outpatient Medications on File Prior to Visit  Medication Sig Dispense Refill  . amitriptyline (ELAVIL) 75 MG tablet TAKE 1 TABLET(75 MG) BY MOUTH AT BEDTIME 30 tablet 2  . B Complex Vitamins (B-COMPLEX/B-12 PO) Take by mouth.    . baclofen (LIORESAL) 10 MG tablet Take 2 tablets (20 mg total) by mouth 3 (three) times daily. 360 each 3  . celecoxib (CELEBREX) 100 MG capsule TAKE 1 CAPSULE(100 MG) BY MOUTH AT BEDTIME 30 capsule 2  . clotrimazole-betamethasone (LOTRISONE) cream Apply 1 application topically 2 (two) times daily. 90 g 2  . Coenzyme Q10 (COQ10) 200 MG CAPS Take by  mouth.    . Cranberry 400 MG TABS Take 2 each by mouth daily.      . Cyanocobalamin (VITAMIN B-12 CR PO) Take 1 each by mouth daily.      . DULoxetine (CYMBALTA) 60 MG capsule TAKE 1 CAPSULE DAILY 90 capsule 4  . finasteride (PROSCAR) 5 MG tablet Take 5 mg by mouth daily.     Marland Kitchen glucagon 1 MG injection Inject 1 mg into the vein once as needed. 1 each 12  . glucose blood (BAYER CONTOUR NEXT TEST) test strip MEDICALLY NECESSARY FOR USE WITH PUMP; Use to check blood sugar 5 times per day and prn; E11.42 500 each 3  . Insulin Infusion Pump Supplies (PARADIGM RESERVOIR 3ML) MISC 1 Device by Does not apply route every 3 (three) days. 30 each 3  . Insulin Infusion Pump Supplies (QUICK-SET INFUSION 43" 9MM) MISC 1 Device by Does not apply route every 3 (three) days. 30 each 3  . insulin lispro (HUMALOG) 100 UNIT/ML injection INJECT 120 UNITS DAILY IN PUMP 120 mL 3  . lisinopril-hydrochlorothiazide (ZESTORETIC) 10-12.5 MG tablet TAKE 1 TABLET DAILY, PLEASE SEE PRIMARY CARE PHYSICIAN FOR ANY FUTURE REFILLS 90 tablet 2  . Multiple Vitamin (MULTIVITAMIN) capsule Take by mouth.    . Multiple Vitamins-Minerals (MULTIVITAMIN,TX-MINERALS) tablet Take 1 tablet by mouth daily.      Marland Kitchen scopolamine (TRANSDERM-SCOP) 1 MG/3DAYS Place 1 patch (1.5 mg total) onto the skin every 3 (three) days. 10 patch 12   Allergies No  Known Allergies  Family History Family History  Problem Relation Age of Onset  . Dementia Mother   . Diabetes Mellitus I Mother   . Hypertension Father        64  . Healthy Sister   . Rheum arthritis Brother   . Cancer Neg Hx   . Colon cancer Neg Hx   . Esophageal cancer Neg Hx   . Stomach cancer Neg Hx   . Rectal cancer Neg Hx   . Colon polyps Neg Hx     Review of Systems: Constitutional:  no fevers or chills Eye:  no recent significant change in vision Ear/Nose/Mouth/Throat:  Ears:  no recent hearing loss Nose/Mouth/Throat:  no complaints of nasal congestion or sore  throat Cardiovascular:  no chest pain Respiratory:  no shortness of breath Gastrointestinal:  no abdominal pain, no change in bowel habits GU:  Male: negative for dysuria, frequency, and incontinence  Musculoskeletal/Extremities:  no new pain of the joints Integumentary (Skin):  no abnormal skin lesions reported Neurologic:  no headaches, Endocrine:  No unexpected weight changes Hematologic/Lymphatic:  no areas of easy bruising  Exam BP (!) 146/76 (BP Location: Left Arm, Patient Position: Sitting, Cuff Size: Large)   Pulse 73   Temp (!) 97.3 F (36.3 C) (Temporal)   Resp 18   Ht 6' (1.829 m)   Wt 237 lb 3.2 oz (107.6 kg)   SpO2 93%   BMI 32.17 kg/m  General:  well developed, well nourished, in no apparent distress Skin:  no significant moles, warts, or growths Head:  no masses, lesions, or tenderness Eyes:  pupils equal and round, sclera anicteric without injection Ears:  canals without lesions, TMs shiny without retraction, no obvious effusion, no erythema Nose:  nares patent, septum midline, mucosa normal Throat/Pharynx:  lips and gingiva without lesion; tongue and uvula midline; non-inflamed pharynx; no exudates or postnasal drainage Neck: neck supple without adenopathy, thyromegaly, or masses Lungs:  clear to auscultation, breath sounds equal bilaterally, no respiratory distress Cardio:  regular rate and rhythm, no LE edema or bruits Rectal: Deferred Musculoskeletal:  symmetrical muscle groups noted without atrophy or deformity Extremities:  no clubbing, cyanosis, or edema, no deformities, no skin discoloration Neuro:  gait normal; deep tendon reflexes normal and symmetric Psych: well oriented with normal range of affect and appropriate judgment/insight  Assessment and Plan  Well adult exam - Plan: Comprehensive metabolic panel, Lipid panel, CBC  Screening for prostate cancer - Plan: PSA  Type 1 diabetes mellitus with hyperglycemia (HCC) - Plan: Hemoglobin A1c   Well  69 y.o. male. Counseled on diet and exercise. Counseled on risks and benefits of screening for prostate cancer with PSA, both he and his wife agreed to undergo screening. Discussed that he is being worked up for possible Paget's disease of the left upper extremity.  Seeing the oncology team at [facility]. Other orders as above. Follow up in 6 mo. Follow-up in 6 weeks for Pneumovax. The patient and his wife voiced understanding and agreement to the plan.  [place], DO 03/24/19 9:02 AM

## 2019-03-24 NOTE — Patient Instructions (Addendum)
Give us 2-3 business days to get the results of your labs back.   Keep the diet clean and stay active.  Let us know if you need anything. 

## 2019-03-27 ENCOUNTER — Other Ambulatory Visit: Payer: Self-pay | Admitting: Physical Medicine & Rehabilitation

## 2019-04-03 ENCOUNTER — Other Ambulatory Visit: Payer: Self-pay

## 2019-04-03 ENCOUNTER — Encounter: Payer: Self-pay | Admitting: Rehabilitative and Restorative Service Providers"

## 2019-04-03 ENCOUNTER — Ambulatory Visit (INDEPENDENT_AMBULATORY_CARE_PROVIDER_SITE_OTHER): Payer: BC Managed Care – PPO | Admitting: Rehabilitative and Restorative Service Providers"

## 2019-04-03 DIAGNOSIS — M6281 Muscle weakness (generalized): Secondary | ICD-10-CM | POA: Diagnosis not present

## 2019-04-03 DIAGNOSIS — R2681 Unsteadiness on feet: Secondary | ICD-10-CM | POA: Diagnosis not present

## 2019-04-03 DIAGNOSIS — G610 Guillain-Barre syndrome: Secondary | ICD-10-CM | POA: Diagnosis not present

## 2019-04-03 DIAGNOSIS — R2689 Other abnormalities of gait and mobility: Secondary | ICD-10-CM
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
## 2019-04-04 ENCOUNTER — Other Ambulatory Visit: Payer: Self-pay | Admitting: Physical Medicine & Rehabilitation

## 2019-04-04 DIAGNOSIS — M88821 Osteitis deformans of right upper arm: Secondary | ICD-10-CM | POA: Diagnosis not present

## 2019-04-07 ENCOUNTER — Telehealth: Payer: Self-pay

## 2019-04-07 NOTE — Telephone Encounter (Signed)
Patient called and has finished therapy and is ready for injections. What type of injections is patient needing to be schedule for?

## 2019-04-07 NOTE — Telephone Encounter (Signed)
Trigger point.  Thanks.

## 2019-04-10 ENCOUNTER — Telehealth: Payer: Self-pay | Admitting: Family Medicine

## 2019-04-10 ENCOUNTER — Other Ambulatory Visit: Payer: Self-pay | Admitting: Neurology

## 2019-04-10 NOTE — Telephone Encounter (Signed)
Patients 05/08/2019 appointment has been changed to a procedure/ trigger points.  Due you want to try and move him up to an earlier date

## 2019-04-10 NOTE — Telephone Encounter (Signed)
Case # [id number] Ph# [phone] 24/7  Lidocaine was denied for "insufficient information". Pt has Guillain Barre syndrome & and chronic back pain. Pt is out now and [employer] sent the denials straight to the pt. Please call to submit PA at wife's request.

## 2019-04-10 NOTE — Telephone Encounter (Signed)
Have called [employer] and this has been denied, can you offer an alternative please

## 2019-04-10 NOTE — Telephone Encounter (Signed)
Called the patients wife informed and she is going to check with his rehab MD and see if he can help, if not she will call [employer] herself to see what can be done.  They do not want OTC as strength is not the same.

## 2019-04-10 NOTE — Telephone Encounter (Signed)
He can get over the counter. Please make sure w pt it is for back pain and see if that updated dx can help w payment. Ty.

## 2019-04-11 NOTE — Telephone Encounter (Signed)
Yes please, soonest available.  Thanks.

## 2019-04-12 NOTE — Telephone Encounter (Signed)
Left voicemail for patient to call our office to get appointment moved up

## 2019-04-12 NOTE — Telephone Encounter (Signed)
Routing to Kenneth Pace to see if she can move patients appt to sooner.

## 2019-04-13 ENCOUNTER — Other Ambulatory Visit: Payer: Self-pay

## 2019-04-13 ENCOUNTER — Ambulatory Visit (INDEPENDENT_AMBULATORY_CARE_PROVIDER_SITE_OTHER): Payer: BC Managed Care – PPO | Admitting: Rehabilitative and Restorative Service Providers"

## 2019-04-13 ENCOUNTER — Encounter: Payer: Self-pay | Admitting: Rehabilitative and Restorative Service Providers"

## 2019-04-13 DIAGNOSIS — M6281 Muscle weakness (generalized): Secondary | ICD-10-CM

## 2019-04-13 DIAGNOSIS — R29898 Other symptoms and signs involving the musculoskeletal system: Secondary | ICD-10-CM

## 2019-04-13 DIAGNOSIS — M25522 Pain in left elbow: Secondary | ICD-10-CM | POA: Diagnosis not present

## 2019-04-13 DIAGNOSIS — M25622 Stiffness of left elbow, not elsewhere classified: Secondary | ICD-10-CM

## 2019-04-13 NOTE — Patient Instructions (Signed)
Access Code: [email or web address]  URL: [email or web address]  Date: 04/13/2019  Prepared by: Rudell Cobb   Exercises Wrist Extension Stretch at [employer] - 3 reps - 1 sets - 20 seconds hold                            - 2x daily - 7x weekly Radial Nerve Flossing - 10 reps - 1 sets - 2x daily - 7x weekly

## 2019-04-14 ENCOUNTER — Ambulatory Visit (HOSPITAL_COMMUNITY)
Admission: RE | Admit: 2019-04-14 | Discharge: 2019-04-14 | Disposition: A | Discharge status: Home / Self Care | Payer: BC Managed Care – PPO | Source: Ambulatory Visit | Attending: Neurology | Admitting: Neurology

## 2019-04-14 DIAGNOSIS — G6181 Chronic inflammatory demyelinating polyneuritis: Secondary | ICD-10-CM | POA: Insufficient documentation

## 2019-04-14 MED ORDER — IMMUNE GLOBULIN (HUMAN) 10 GM/100ML IV SOLN
1.0000 g/kg | INTRAVENOUS | Status: DC
  Administered 2019-04-14: 09:00:00 105 g via INTRAVENOUS
  Filled 2019-04-14: qty 1000

## 2019-04-14 NOTE — Addendum Note (Signed)
Addended by: Rudell Cobb M on: 04/14/2019 09:18 AM   Modules accepted: Orders

## 2019-04-14 NOTE — Therapy (Signed)
[place], [place], [address] Phone: [phone]   Fax:  [phone]  Physical Therapy Evaluation  Patient Details  Name: Kenneth Pace MRN: [id number] Date of Birth: 1950-06-01 Referring Provider (PT): Dr. Signa Kell   Encounter Date: 04/13/2019  PT End of Session - 04/13/19 0926    Visit Number  1    Number of Visits  6    Date for PT Re-Evaluation  05/25/19    PT Start Time  0846    PT Stop Time  0928    PT Time Calculation (min)  42 min    Activity Tolerance  Patient tolerated treatment well    Behavior During Therapy  [facility] for tasks assessed/performed       Past Medical History:  Diagnosis Date  . ADENOIDECTOMY, HX OF 02/16/2007  . Anxiety    pt. states he does not have anxiety.  Marland Kitchen BENIGN PROSTATIC HYPERTROPHY, WITH OBSTRUCTION 02/03/2010  . DIABETES MELLITUS, TYPE I, UNCONTROLLED 12/29/2006  . ERECTILE DYSFUNCTION 02/16/2007  . HYPERLIPIDEMIA 02/16/2007  . HYPERTENSION 02/16/2007  . Sleep apnea    uses CPAP  . TESTICULAR MASS, LEFT 02/16/2007    Past Surgical History:  Procedure Laterality Date  . ANTERIOR CERVICAL DECOMP/DISCECTOMY FUSION N/A 02/01/2017   Procedure: ANTERIOR CERVICAL DECOMPRESSION/DISCECTOMY FUSION CERVICAL THREE-FOUR , CERVICAL FOUR-FIVE  CERVICAL FIVE-SIX;  Surgeon: Consuella Lose, MD;  Location: [place];  Service: Neurosurgery;  Laterality: N/A;  . COLONOSCOPY  02/05/2010   [facility]  . KNEE SURGERY     2 Lknee arthroscopy, 3 R knee arthroscpoy  . KNEE SURGERY Right 11/12/2016  . TONSILLECTOMY      There were no vitals filed for this visit.   Subjective Assessment - 04/13/19 0850    Subjective  The patient reports he had full bone scan for Paget's Disease (PT reviewed in epic-- he gets results tomorrow and PT recommended he see ordering MD for results).  He has swelling L elbow that began years ago soon after Guillian Barre/CIDP diagnosis.    Patient Stated Goals  maintain use and range of motion with left elbow    Currently in Pain?  Yes    Pain Score  0-No pain    Pain Location  Elbow    Pain Orientation  Left    Pain Onset  More than a month ago    Pain Frequency  Intermittent    Aggravating Factors   straightening elbow,reaching to wash hair    Pain Relieving Factors  topical gel    Effect of Pain on Daily Activities  also has chronic back pain (patient managed by MD)         [facility] PT Assessment - 04/13/19 0854      Assessment   Medical Diagnosis  PIN Compression ROM L UE    Referring Provider (PT)  Dr. Signa Kell    Onset Date/Surgical Date  04/10/19    Hand Dominance  Right    Prior Therapy  known to our clinic from prior therapy       Precautions   Precautions  [place] residence      Observation/Other Assessments   Focus on Therapeutic Outcomes (FOTO)   --      Observation/Other Assessments-Edema    Edema  Circumferential      Circumferential Edema   Circumferential - Right  30 cm    Circumferential -  Left   31.5cm      Sensation   Light Touch  Impaired Detail    Additional Comments  Patient has numbness in bilateral hands from CIDP (worse when due for IVIG treatment).      AROM   Overall AROM   Deficits    AROM Assessment Site  Elbow    Right/Left Elbow  Right;Left    Right Elbow Flexion  140    Right Elbow Extension  0    Left Elbow Flexion  100    Left Elbow Extension  -12      Strength   Overall Strength  Deficits    Strength Assessment Site  Shoulder;Elbow;Wrist    Right/Left Shoulder  Right;Left    Right Shoulder Flexion  5/5    Right Shoulder ABduction  5/5    Left Shoulder Flexion  4+/5   pain with pressure (pressing above bony growth)   Right/Left Elbow  Right;Left    Right Elbow Flexion  5/5    Right Elbow Extension  5/5    Left Elbow Flexion  5/5    Left Elbow Extension  4-/5   pain in cubital fossa   Right/Left Wrist   Right;Left    Right Wrist Flexion  5/5    Right Wrist Extension  5/5    Left Wrist Flexion  5/5    Left Wrist Extension  5/5      Palpation   Palpation comment  pain with pressure through left cubital fossa, bony prominence lateral humerus at elbow noted      Special Tests   Other special tests  repeated pronation/supination, no pain.  Reaching to wash hair, has to help with R UE.  Elbow extension is most painful movement.                Objective measurements completed on examination: See above findings.      [facility] Adult PT Treatment/Exercise - 04/13/19 0910      Exercises   Exercises  Elbow    Other Exercises   Patient is currently using 10 klb kettle bells for wrist flexion/extension, radial deviation.  He is also performing kettle bells with 10 lb.      Elbow Exercises   Other elbow exercises  radial nerve flossing    Other elbow exercises  wrist extension at wall for stretch             PT Education - 04/13/19 0925    Education Details  initiated HEP for radial nerve flossing and wrist extension for forearm stretch    Person(s) Educated  Patient    Methods  Explanation;Demonstration;Handout    Comprehension  Verbalized understanding;Returned demonstration       PT Short Term Goals - 02/16/19 1400      PT SHORT TERM GOAL #1   Title  n/a        PT Long Term Goals - 04/13/19 1518      PT LONG TERM GOAL #1   Title  The patient will be indep with HEP for L elbow ROM, flexibility, and neural glides.    Time  6    Period  Weeks    Target Date  05/25/19      PT LONG TERM GOAL #2   Title  The patient will demonstrate home resistance training routine with good form for post d/c strengthening.    Time  6    Period  Weeks    Target Date  05/25/19  PT LONG TERM GOAL #3   Title  The patient will report being able to lift 5 lb item in his garage to place it on a shelf (extending through available elbow ROM).    Time  6    Period  Weeks     Target Date  05/25/19      PT LONG TERM GOAL #4   Title  The patient will improve L elbow extension to 5/5 strength.    Time  6    Period  Weeks    Target Date  05/25/19             Plan - 04/13/19 1522    Clinical Impression Statement  The patient is a 69 yo male known to our clinic from prior PT for CIDP and cervical myelopathy recently dx with Paget's disease presenting with L PIN compression.  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  PT to address deficits to teach self mgmt and optimize current functional status.    Personal Factors and Comorbidities  Comorbidity 1;Comorbidity 2;Comorbidity 3+    Comorbidities  diabetes, h/o LBP, guillian barre, cervical myelopathy    Examination-Activity Limitations  Lift;Reach Overhead    Examination-Participation Restrictions  Cleaning    Stability/Clinical Decision Making  Stable/Uncomplicated    Clinical Decision Making  Low    Rehab Potential  Good    PT Frequency  1x / week    PT Duration  6 weeks    PT Treatment/Interventions  Therapeutic exercise;ADLs/Self Care Home Management;Electrical Stimulation;Ultrasound;Therapeutic activities;Taping;Passive range of motion;Manual techniques;Splinting;Patient/family education    PT Next Visit Plan  check HEP and progress    PT Home Exercise Plan  Access Code: [email or web address]    Consulted and Agree with Plan of Care  Patient       Patient will benefit from skilled therapeutic intervention in order to improve the following deficits and impairments:  Decreased strength, Decreased range of motion, Pain, Impaired flexibility  Visit Diagnosis: Muscle weakness (generalized)  Pain in left elbow  Stiffness of left elbow, not elsewhere classified  Other symptoms  and signs involving the musculoskeletal system     Problem List Patient Active Problem List   Diagnosis Date Noted  . Spastic gait 03/07/2019  . Myalgia 11/01/2018  . Chronic bilateral low back pain without sciatica 07/21/2018  . 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 Edema 03/01/2017  . Hypokalemia   . Slow transit constipation   . Steroid-induced hyperglycemia   . Urinary retention   . Accidental drug overdose   . Postoperative pain   . Hyponatremia   . Cervical myelopathy ([place]) 02/01/2017  . Shortness of breath at rest   . Hypoglycemia   . Spondylosis, cervical, with myelopathy   . Neuropathic pain   . Benign essential HTN   . Labile blood glucose   . Muscle spasm   . GAD (generalized anxiety disorder)   . 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 Anxiety state   . Neurogenic bladder   . Depression 12/30/2016  . Leg weakness, bilateral 12/30/2016  . 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 GBS (Guillain Barre syndrome) ([place])   . AIDP (acute inflammatory demyelinating polyneuropathy) ([place]) 11/27/2016  .  Weakness 11/20/2016  . Weakness of both arms 10/30/2016  . 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 Obstructive sleep apnea 03/24/2016  . Numbness 03/18/2016  . 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 Nuclear cataract of both eyes 05/24/2015  . Diabetes mellitus without complication ([place]) [id number]  . 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 Eustachian tube dysfunction 02/12/2015  . Acute maxillary sinusitis 02/12/2015  . Wellness examination 08/22/2014  . Alkaline phosphatase elevation 08/22/2014  . 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 Benign non-nodular prostatic hyperplasia with lower urinary tract  symptoms 01/31/2014  . Lesion of eyelid 09/26/2013  . Screening for prostate cancer 04/24/2013  . Diabetes ([place]) 06/16/2012  . ED (erectile dysfunction) of organic origin 06/03/2012  . Routine general medical examination at a health care facility 04/10/2012  . BPH (benign prostatic hyperplasia) 02/03/2010  . HEMOCCULT POSITIVE STOOL 05/25/2008  . ELBOW PAIN, RIGHT 07/04/2007  . Dyslipidemia 02/16/2007  . ANXIETY 02/16/2007  . ERECTILE DYSFUNCTION 02/16/2007  . Essential hypertension 02/16/2007  . TESTICULAR MASS, LEFT 02/16/2007  . KNEE PAIN, CHRONIC 02/16/2007  . Michell Heinrich OF 02/16/2007    [place], PT 04/14/2019, 9:16 AM  [facility] [place], [place], [address] Phone: [phone]   Fax:  [phone]  Name: Kenneth Pace MRN: [id number] Date of Birth: July 23, 1950

## 2019-04-18 DIAGNOSIS — M889 Osteitis deformans of unspecified bone: Secondary | ICD-10-CM | POA: Diagnosis not present

## 2019-04-24 ENCOUNTER — Ambulatory Visit (INDEPENDENT_AMBULATORY_CARE_PROVIDER_SITE_OTHER): Payer: BC Managed Care – PPO | Admitting: Rehabilitative and Restorative Service Providers"

## 2019-04-24 ENCOUNTER — Encounter: Payer: Self-pay | Admitting: Rehabilitative and Restorative Service Providers"

## 2019-04-24 ENCOUNTER — Other Ambulatory Visit: Payer: Self-pay

## 2019-04-24 DIAGNOSIS — M6281 Muscle weakness (generalized): Secondary | ICD-10-CM

## 2019-04-24 DIAGNOSIS — M25522 Pain in left elbow: Secondary | ICD-10-CM

## 2019-04-24 DIAGNOSIS — R29898 Other symptoms and signs involving the musculoskeletal system: Secondary | ICD-10-CM | POA: Diagnosis not present

## 2019-04-24 DIAGNOSIS — M25622 Stiffness of left elbow, not elsewhere classified: Secondary | ICD-10-CM | POA: Diagnosis not present

## 2019-04-24 NOTE — Patient Instructions (Signed)
Access Code: [email or web address]  URL: [email or web address]  Date: 04/24/2019  Prepared by: Rudell Cobb   Exercises Wrist Extension Stretch at [employer] - 3 reps - 1 sets - 20 seconds hold                            - 2x daily - 7x weekly Radial Nerve Flossing - 10 reps - 1 sets - 2x daily - 7x weekly Standing Wrist Flexion Stretch - 3 reps - 1 sets - 20 seconds hold - 2x daily - 7x weekly Supine Chest Stretch with Elbows Bent - 10 reps - 1 sets - 2x daily - 7x weekly

## 2019-04-24 NOTE — Therapy (Addendum)
[place], [place], [address] Phone: [phone]   Fax:  [phone]  Physical Therapy Treatment and Discharge Summary  Patient Details  Name: Kenneth Pace MRN: [id number] Date of Birth: 1951/01/28 Referring Provider (PT): Dr. Signa Kell   Encounter Date: 04/24/2019  PT End of Session - 04/24/19 0854    Visit Number  2    Number of Visits  6    Date for PT Re-Evaluation  05/25/19    PT Start Time  0848    PT Stop Time  0930    PT Time Calculation (min)  42 min    Activity Tolerance  Patient tolerated treatment well    Behavior During Therapy  [facility] for tasks assessed/performed       Past Medical History:  Diagnosis Date  . ADENOIDECTOMY, HX OF 02/16/2007  . Anxiety    pt. states he does not have anxiety.  Marland Kitchen BENIGN PROSTATIC HYPERTROPHY, WITH OBSTRUCTION 02/03/2010  . DIABETES MELLITUS, TYPE I, UNCONTROLLED 12/29/2006  . ERECTILE DYSFUNCTION 02/16/2007  . HYPERLIPIDEMIA 02/16/2007  . HYPERTENSION 02/16/2007  . Sleep apnea    uses CPAP  . TESTICULAR MASS, LEFT 02/16/2007    Past Surgical History:  Procedure Laterality Date  . ANTERIOR CERVICAL DECOMP/DISCECTOMY FUSION N/A 02/01/2017   Procedure: ANTERIOR CERVICAL DECOMPRESSION/DISCECTOMY FUSION CERVICAL THREE-FOUR , CERVICAL FOUR-FIVE  CERVICAL FIVE-SIX;  Surgeon: Consuella Lose, MD;  Location: [place];  Service: Neurosurgery;  Laterality: N/A;  . COLONOSCOPY  02/05/2010   [facility]  . KNEE SURGERY     2 Lknee arthroscopy, 3 R knee arthroscpoy  . KNEE SURGERY Right 11/12/2016  . TONSILLECTOMY      There were no vitals filed for this visit.  Subjective Assessment - 04/24/19 0853    Subjective  The patient saw MD for Paget's disease and notes unlikely to get ROM changes in the elbow.  We discussed PT working on use and reducing pain he noted at eval when he lifts items.    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  No/denies    Pain Score  0-No pain                       [facility] Adult PT Treatment/Exercise - 04/24/19 0855      Exercises   Exercises  Elbow;Shoulder    Other Exercises   UBE x 3 minutes forward, 1 minute backward      Elbow Exercises   Elbow Flexion  AROM;Left;10 reps    Elbow Extension  AROM;Left;10 reps    Forearm Supination  AROM;Strengthening;Left;10 reps    Bar Weights/Barbell (Forearm Supination)  2 lbs    Forearm Pronation  AROM;Strengthening;Left;10 reps    Bar Weights/Barbell (Forearm Pronation)  2 lbs    Other elbow exercises  reviewed wrist flexion and extension standing for stretch, wrist extension in standing weight bearing through mat table    Other elbow exercises  theraband strenghtening for elbow flexion, wrist radiation deviation, wrist flexion/extension x 12 reps      Shoulder Exercises: Supine   External Rotation  AROM;Both;10 reps    Flexion  AROM;Both;10 reps    Other Supine Exercises  *performed end range stretching in order to ensure full AROM shoulders for overhead ADLs (showering,  washing hair, etc)             PT Education - 04/24/19 0922    Education Details  HEP progression for wrist/elbow    Person(s) Educated  Patient    Methods  Explanation;Demonstration;Handout    Comprehension  Verbalized understanding;Returned demonstration          PT Long Term Goals - 04/13/19 1518      PT LONG TERM GOAL #1   Title  The patient will be indep with HEP for L elbow ROM, flexibility, and neural glides.    Time  6    Period  Weeks    Target Date  05/25/19      PT LONG TERM GOAL #2   Title  The patient will demonstrate home resistance training routine with good form for post d/c strengthening.    Time  6    Period  Weeks    Target Date  05/25/19      PT LONG TERM GOAL #3   Title   The patient will report being able to lift 5 lb item in his garage to place it on a shelf (extending through available elbow ROM).    Time  6    Period  Weeks    Target Date  05/25/19      PT LONG TERM GOAL #4   Title  The patient will improve L elbow extension to 5/5 strength.    Time  6    Period  Weeks    Target Date  05/25/19            Plan - 04/24/19 0109    Clinical Impression Statement  The patient has less tenderness over cubital fossa today.  PT added strengthening in clinic.  He notes he is lifting 10lb weights at home -- PT recommended switching to 5 lb weights.  Plan to review stretching, and have patient demo current strengthening routine to modify.    Comorbidities  diabetes, h/o LBP, guillian barre, cervical myelopathy    PT Treatment/Interventions  Therapeutic exercise;ADLs/Self Care Home Management;Electrical Stimulation;Ultrasound;Therapeutic activities;Taping;Passive range of motion;Manual techniques;Splinting;Patient/family education    PT Next Visit Plan  check HEP current strengthening with weights (patient to demo), consider adding t-band exercise at home for wrist/elbow strengthening, continue to review prior stretches and review lifting technique    PT Home Exercise Plan  Access Code: [email or web address]    Consulted and Agree with Plan of Care  Patient       Patient will benefit from skilled therapeutic intervention in order to improve the following deficits and impairments:  Decreased strength, Decreased range of motion, Pain, Impaired flexibility  Visit Diagnosis: Muscle weakness (generalized)  Pain in left elbow  Stiffness of left elbow, not elsewhere classified  Other symptoms and signs involving the musculoskeletal system    PHYSICAL THERAPY DISCHARGE SUMMARY  Visits from Start of Care: 2  Current functional level related to goals / functional outcomes: See goals above.   Remaining deficits: Last known status was at this visit-- patient did not  return. Per medical chart review, patient underwent lumbar decompression surgery.   Education / Equipment: Home program.  Plan: Patient agrees to discharge.  Patient goals were not met. Patient is being discharged due to a change in medical status.  ?????         Thank you for the referral of this patient. Rudell Cobb, MPT   Khaliel Morey, PT 04/24/2019, 1:14 PM  [facility] [address]  [place], [place], [address] Phone: [phone]   Fax:  [phone]  Name: Kenneth Pace MRN: [id number] Date of Birth: [birth year]

## 2019-04-25 ENCOUNTER — Encounter
Payer: BC Managed Care – PPO | Attending: Physical Medicine & Rehabilitation | Admitting: Physical Medicine & Rehabilitation

## 2019-04-25 ENCOUNTER — Encounter: Payer: Self-pay | Admitting: Physical Medicine & Rehabilitation

## 2019-04-25 ENCOUNTER — Other Ambulatory Visit: Payer: Self-pay

## 2019-04-25 ENCOUNTER — Encounter: Payer: BC Managed Care – PPO | Admitting: Physical Medicine & Rehabilitation

## 2019-04-25 VITALS — BP 160/95 | HR 96 | Temp 98.0°F | Ht 72.0 in | Wt 237.0 lb

## 2019-04-25 DIAGNOSIS — G6181 Chronic inflammatory demyelinating polyneuritis: Secondary | ICD-10-CM | POA: Insufficient documentation

## 2019-04-25 DIAGNOSIS — I10 Essential (primary) hypertension: Secondary | ICD-10-CM | POA: Insufficient documentation

## 2019-04-25 DIAGNOSIS — G8929 Other chronic pain: Secondary | ICD-10-CM | POA: Diagnosis not present

## 2019-04-25 DIAGNOSIS — R269 Unspecified abnormalities of gait and mobility: Secondary | ICD-10-CM | POA: Diagnosis not present

## 2019-04-25 DIAGNOSIS — R261 Paralytic gait: Secondary | ICD-10-CM | POA: Diagnosis not present

## 2019-04-25 DIAGNOSIS — M791 Myalgia, unspecified site: Secondary | ICD-10-CM | POA: Diagnosis not present

## 2019-04-25 DIAGNOSIS — M545 Low back pain: Secondary | ICD-10-CM | POA: Diagnosis not present

## 2019-04-25 DIAGNOSIS — G959 Disease of spinal cord, unspecified: Secondary | ICD-10-CM | POA: Diagnosis not present

## 2019-04-25 DIAGNOSIS — G610 Guillain-Barre syndrome: Secondary | ICD-10-CM

## 2019-04-25 DIAGNOSIS — M792 Neuralgia and neuritis, unspecified: Secondary | ICD-10-CM | POA: Diagnosis not present

## 2019-04-25 MED ORDER — LIDOCAINE 5 % EX PTCH: MEDICATED_PATCH | CUTANEOUS | 2 refills | Status: DC

## 2019-04-25 NOTE — Addendum Note (Signed)
Addended by: Delice Lesch A on: 04/25/2019 10:00 AM   Modules accepted: Orders

## 2019-04-25 NOTE — Progress Notes (Signed)
Trigger point injection procedure note: Trigger Point Injection: Written consent was obtained for the patient. Trigger points were identified of right > thoracolumbar paraspinal muscles. The areas were cleaned with alcohol, vapocoolant spray applied, needle drawback performed, and each of  these trigger points were injected with 1 cc of 0.5% Marcaine (x6). There were no complications from the procedure, and it was well tolerated.

## 2019-04-27 DIAGNOSIS — M889 Osteitis deformans of unspecified bone: Secondary | ICD-10-CM | POA: Diagnosis not present

## 2019-05-01 ENCOUNTER — Encounter: Payer: BC Managed Care – PPO | Admitting: Rehabilitative and Restorative Service Providers"

## 2019-05-01 ENCOUNTER — Telehealth: Payer: Self-pay | Admitting: Neurology

## 2019-05-01 ENCOUNTER — Telehealth: Payer: Self-pay | Admitting: Internal Medicine

## 2019-05-01 ENCOUNTER — Telehealth: Payer: Self-pay

## 2019-05-01 MED ORDER — TIZANIDINE HCL 2 MG PO TABS: 2.0000 mg | ORAL_TABLET | Freq: Three times a day (TID) | ORAL | 1 refills | Status: DC | PRN

## 2019-05-01 MED ORDER — PREDNISONE 20 MG PO TABS: 20.0000 mg | ORAL_TABLET | ORAL | 0 refills | Status: DC

## 2019-05-01 NOTE — Telephone Encounter (Signed)
Returned patients spouse's call. Informed her that I spoke with her husband and gave him Dr Serita Grit recommendations and he was ok with the recommendations. She said is is very upset because again the patient is in a lot of pain. He can not stand and is walking at a ninety degree angle. She states she is very upset because she called the other Dr Serita Grit office and they gave her an appt for next month, and are acting very nonchalant about her concerns. She states is trying to avoid the [facility] ED because of Covid.   She wanted to know my suggestions I advised her to contact the patients pcp and the other Dr Serita Grit office again. I told her I understand her frustration, but its not our office who she should be upset with.   She stated the patient has a new pcp who wont even refill the lidocaine patches or give an rx for Tramadol. She states she is not happy with the physicians at [facility]. She states she does not know what to do because her husbands sugar is over 350. I advised her to reach out to her husbands other two physicians because they treat the issues that she is complaining about.   She stated again " she does not want to take him to the ED and if she does take him to the ED it is not going to be nice". I let her know that I would make Dr Posey Pronto aware of her concerns. She stated that she will keep trying to get in touch with the other Dr Serita Grit office and hung up.

## 2019-05-01 NOTE — Telephone Encounter (Signed)
Spoke with patient and informed him of Dr Serita Grit recommendations. He voiced understanding.

## 2019-05-01 NOTE — Telephone Encounter (Signed)
Received a call from wife and pt's wife Adonis Brook with concerns of hyperglycemia.    Pt on the [employer] pump, he received a steroid intra-articulr injection last week , resulting in hyperglycemia, per wife he is scheduled to have a 4 days of oral prednisone.    I was able to obtain his basal rate and SF but not sure of his I:C ratio . Pt unclear of his carb counting either   0000-0600 2.6 u/hr  0600-1030 4.4 u/hr  1030-0000 2.6 u/hr   SF 50  Goal 100 I: C ratio unknown    It seems the pt counts his own correction bolus and over-rides the pump, he has been giving himself close to 15 units of correction bolus, instead of 8 units .   Recommendations  - I have asked him to change his SF from 50 to 30 , goal remains 100 - He was advised to bolus every 4 hrs rather then 2 hrs like he has been doing , to avoid insulin stacking.   - No changes to basal rate  - No advise on the insulin to carb ratio was provided, as I am not clear what his current settings are, he was unable to tell me.   Pt expressed understanding   Abby Nena Jordan, MD  [facility] End[facility]in[facility]  [facility] [address] [place], [place] [address] Phone: [phone] FAX: [phone]

## 2019-05-01 NOTE — Telephone Encounter (Signed)
Please inform patient that they need to contact Dr. Thelma Comp Nellene Courtois's office, who is managing his pain.  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

## 2019-05-01 NOTE — Telephone Encounter (Signed)
Spoke with patients spouse she states the patient is in a lot of pain. She states the patient had a in office appt on Tuesday and has been in a lot of pain ever since. She is requesting an appt today to have patient evaluated. She states the patient does not have anything for pain except for lidocaine patched and they are not helping. She states she does not want to go to the ED.Reason being is all they will is give the patient a shot and send him home and that does not help them long term. She states the patients left side is ok but the right side is worse and he is a fall risk. She wants to know if we can give them something for pain. She states she will buy the office lunch if that will get her an appt sooner. She says she does not know what to do and this past weekend and she does not know what to do.   Please advise.

## 2019-05-01 NOTE — Telephone Encounter (Signed)
Patient's daughter called and left a message requesting a call back from a nurse.

## 2019-05-01 NOTE — Telephone Encounter (Signed)
Ptns wife Justice Deeds called she states roughly 36 hours after trigger points patient started experiencing pain.  No pain or bruising on left - bruising on right only - it has gotten increasingly worse - it has now moved in to groin - hip flexor and glute.  Ptn got on phone and said he can barely stand - cannot stand straight - when he attempts to walk his leg is collapsing and is using a cane/walker to shuffle around.  If he sits down with good posture after 20 minutes the pain will ease - but the minute he stands up it is back and does not alleviate.   He is currently on baclofen 10 MG 3 x 3 x day.  They attempted over weekend to use gabapentin 600 that had been discontinued because it caused edema in legs - no relieve - heating pad - no relief- hot shower and steam - no relieve. When he sits and doesn't move the pain will drop to a 2 - when he gets up it peaks at 09/24/08.They do not want to go to Emergency room due to covid -  His number is Gerty # [phone] Please advise what to do short term as AP is out of office for a week

## 2019-05-01 NOTE — Telephone Encounter (Signed)
I spoke to Mr Lingren at length. His symptoms are most consistent with an exacerbation of his lumbar facet arthropathy. Not exactly sure why it flared after TPI's. There is no substantial bruising or any symptoms c/w CIDP  Advised the following: 1: brief prednisone taper -rx sent 2. Zanaflex prn for spasms (in addition to baclofen) rx sent 3. Lumbar flexion stretches, hamstring and glute stretches 4. Regular heat 5. No weight lifting for now! 6. Follow up with Dr. Posey Pronto in the office when he returns.  Meredith Staggers, MD, [facility] 05/01/2019

## 2019-05-01 NOTE — Telephone Encounter (Signed)
The following message was left with [employer] on 04/30/19 at 2:05 PM.  Caller states spouse is having pain in the back and had injections on Tuesday. He cannot straighten up to stand. He has Guillain-Barre Syndrome.  Caller states patient has back pain and is walking bent over, pain has worsened since shot. Patient states back pain is a 7/10 and pain to walk is 10/10.  Patient advised to proceed to nearest emergency department. Patient stated he does want to go to the ED. This nurse recommended could reach out to on-call doctor for different recommendation. Caller declined offer to call on-call MD.  This nurse explained the importance of patient being seen, caller verbalized understanding. This nurse recommends call to office tomorrow during business hours to try to get an appointment.

## 2019-05-05 ENCOUNTER — Ambulatory Visit: Payer: BC Managed Care – PPO

## 2019-05-07 ENCOUNTER — Emergency Department (HOSPITAL_COMMUNITY): Payer: BC Managed Care – PPO

## 2019-05-07 ENCOUNTER — Other Ambulatory Visit: Payer: Self-pay

## 2019-05-07 ENCOUNTER — Encounter (HOSPITAL_COMMUNITY): Payer: Self-pay | Admitting: Emergency Medicine

## 2019-05-07 ENCOUNTER — Emergency Department (HOSPITAL_COMMUNITY)
Admission: EM | Admit: 2019-05-07 | Discharge: 2019-05-07 | Disposition: A | Discharge status: Home / Self Care | Payer: BC Managed Care – PPO | Attending: Emergency Medicine | Admitting: Emergency Medicine

## 2019-05-07 DIAGNOSIS — I10 Essential (primary) hypertension: Secondary | ICD-10-CM | POA: Diagnosis not present

## 2019-05-07 DIAGNOSIS — M5431 Sciatica, right side: Secondary | ICD-10-CM | POA: Diagnosis not present

## 2019-05-07 DIAGNOSIS — E119 Type 2 diabetes mellitus without complications: Secondary | ICD-10-CM | POA: Insufficient documentation

## 2019-05-07 DIAGNOSIS — Z794 Long term (current) use of insulin: Secondary | ICD-10-CM | POA: Insufficient documentation

## 2019-05-07 DIAGNOSIS — M5127 Other intervertebral disc displacement, lumbosacral region: Secondary | ICD-10-CM | POA: Diagnosis not present

## 2019-05-07 DIAGNOSIS — M48061 Spinal stenosis, lumbar region without neurogenic claudication: Secondary | ICD-10-CM | POA: Diagnosis not present

## 2019-05-07 DIAGNOSIS — Z79899 Other long term (current) drug therapy: Secondary | ICD-10-CM | POA: Insufficient documentation

## 2019-05-07 DIAGNOSIS — M25551 Pain in right hip: Secondary | ICD-10-CM | POA: Diagnosis not present

## 2019-05-07 DIAGNOSIS — M5441 Lumbago with sciatica, right side: Secondary | ICD-10-CM | POA: Diagnosis not present

## 2019-05-07 LAB — BASIC METABOLIC PANEL
Anion gap: 8 (ref 5–15)
BUN: 23 mg/dL (ref 8–23)
CO2: 24 mmol/L (ref 22–32)
Calcium: 8.9 mg/dL (ref 8.9–10.3)
Chloride: 102 mmol/L (ref 98–111)
Creatinine, Ser: 0.74 mg/dL (ref 0.61–1.24)
GFR calc Af Amer: >60 mL/min (ref >60)
GFR calc non Af Amer: >60 mL/min (ref >60)
Glucose, Bld: 121 mg/dL — ABNORMAL HIGH (ref 70–99)
Potassium: 3.7 mmol/L (ref 3.5–5.1)
Sodium: 134 mmol/L — ABNORMAL LOW (ref 135–145)

## 2019-05-07 LAB — CBC WITH DIFFERENTIAL/PLATELET
Abs Immature Granulocytes: 0.02 10*3/uL (ref 0.00–0.07)
Basophils Absolute: 0 10*3/uL (ref 0.0–0.1)
Basophils Relative: 0 %
Eosinophils Absolute: 0.1 10*3/uL (ref 0.0–0.5)
Eosinophils Relative: 2 %
HCT: 50.9 % (ref 39.0–52.0)
Hemoglobin: 17.5 g/dL — ABNORMAL HIGH (ref 13.0–17.0)
Immature Granulocytes: 0 %
Lymphocytes Relative: 20 %
Lymphs Abs: 1.7 10*3/uL (ref 0.7–4.0)
MCH: 29.7 pg (ref 26.0–34.0)
MCHC: 34.4 g/dL (ref 30.0–36.0)
MCV: 86.3 fL (ref 80.0–100.0)
Monocytes Absolute: 0.8 10*3/uL (ref 0.1–1.0)
Monocytes Relative: 9 %
Neutro Abs: 5.7 10*3/uL (ref 1.7–7.7)
Neutrophils Relative %: 69 %
Platelets: 251 10*3/uL (ref 150–400)
RBC: 5.9 MIL/uL — ABNORMAL HIGH (ref 4.22–5.81)
RDW: 12.6 % (ref 11.5–15.5)
WBC: 8.4 10*3/uL (ref 4.0–10.5)
nRBC: 0 % (ref 0.0–0.2)

## 2019-05-07 LAB — CBG MONITORING, ED: Glucose-Capillary: 155 mg/dL — ABNORMAL HIGH (ref 70–99)

## 2019-05-07 MED ORDER — HYDROCODONE-ACETAMINOPHEN 5-325 MG PO TABS: 1.0000 | ORAL_TABLET | Freq: Four times a day (QID) | ORAL | 0 refills | Status: DC | PRN

## 2019-05-07 MED ORDER — GADOBUTROL 1 MMOL/ML IV SOLN
10.0000 mL | Freq: Once | INTRAVENOUS | Status: AC | PRN
  Administered 2019-05-07: 10 mL via INTRAVENOUS

## 2019-05-07 MED ORDER — HYDROCODONE-ACETAMINOPHEN 5-325 MG PO TABS
1.0000 | ORAL_TABLET | Freq: Once | ORAL | Status: AC
  Administered 2019-05-07: 1 via ORAL
  Filled 2019-05-07: qty 1

## 2019-05-07 NOTE — Discharge Instructions (Addendum)
MRI shows evidence of a significantly herniated L4 disc pressing on the L4 nerve root.  Call your neurosurgeon for follow-up.  Take the pain medicine as directed.  The prescription will say 1 tablet every 6 hours.  But you can take 2 every 6 hours if needed.

## 2019-05-07 NOTE — ED Triage Notes (Addendum)
C/o R hip pain that radiates down R leg x 1 week.  States pain started after receiving steroid injections in back.  Reports blood sugars had been in the 500's after steroid injections but has gotten them down to 200's.  CBG 155 at this time.

## 2019-05-07 NOTE — ED Notes (Signed)
Pt discharge instructions and prescription reviewed with the patient. The patient verbalized understanding of both. Pt discharged. 

## 2019-05-07 NOTE — ED Provider Notes (Signed)
[facility] EMERGENCY DEPARTMENT Provider Note   CSN: [id number] Arrival date & time: 05/07/19  [id number]     History Chief Complaint  Patient presents with  . Hip Pain    Kenneth Pace is a 69 y.o. male.  Patient with complaint of some right hip pain little bit of right back pain that radiates down the right leg to the level of the knee laterally for 2 weeks.  Patient 5 days ago received steroid injections for this by rehab medicine.  No real improvement.  Also had a 3-day course of oral steroids.  He has an insulin pump and his blood sugars been running at home but has been increasing his insulin to help control that.  Patient is also followed by neurosurgery.  And has had cervical disc surgery in the past.  Past medical history is significant for diabetes type 1.  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  Patient's had lot of chronic medical problems.  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  There is been no falls or injuries.        Past Medical History:  Diagnosis Date  . ADENOIDECTOMY, HX OF 02/16/2007  . Anxiety    pt. states he does not have anxiety.  Marland Kitchen BENIGN PROSTATIC HYPERTROPHY, WITH OBSTRUCTION 02/03/2010  . DIABETES MELLITUS, TYPE I, UNCONTROLLED 12/29/2006  . ERECTILE DYSFUNCTION 02/16/2007  . HYPERLIPIDEMIA 02/16/2007  . HYPERTENSION 02/16/2007  . Sleep apnea    uses CPAP  . TESTICULAR MASS, LEFT 02/16/2007    Patient Active Problem List   Diagnosis Date Noted  . Spastic gait 03/07/2019  . Myalgia 11/01/2018  . Chronic bilateral low back pain without sciatica 07/21/2018  . 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 CIDP (chronic inflammatory demyelinating polyneuropathy) ([place]) 12/30/2017  . Edema 03/01/2017  . Hypokalemia   . Slow transit constipation   . Steroid-induced hyperglycemia   . Urinary retention   . Accidental drug overdose     . Postoperative pain   . Hyponatremia   . Cervical myelopathy ([place]) 02/01/2017  . Shortness of breath at rest   . Hypoglycemia   . Spondylosis, cervical, with myelopathy   . Neuropathic pain   . Benign essential HTN   . Labile blood glucose   . Muscle spasm   . GAD (generalized anxiety disorder)   . Acute inflammatory demyelinating polyneuropathy ([place]) 01/11/2017  . Anxiety state   . Neurogenic bladder   . Depression 12/30/2016  . Leg weakness, bilateral 12/30/2016  . 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[place]) 12/30/2016  . GBS (Guillain Barre syndrome) ([place])   . AIDP (acute inflammatory demyelinating polyneuropathy) ([place]) 11/27/2016  . Weakness 11/20/2016  . Weakness of both arms 10/30/2016  . 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 Obstructive sleep apnea 03/24/2016  . Numbness 03/18/2016  . 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 Nuclear cataract of both eyes 05/24/2015  . Diabetes mellitus without complication ([place]) [id number]  . 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 Eustachian tube dysfunction 02/12/2015  . Acute maxillary sinusitis 02/12/2015  . Wellness examination 08/22/2014  . Alkaline phosphatase elevation 08/22/2014  . 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 Benign non-nodular prostatic hyperplasia with lower urinary tract symptoms 01/31/2014  . Lesion of eyelid 09/26/2013  . Screening for prostate cancer 04/24/2013  . Diabetes ([place]) 06/16/2012  . ED (  erectile dysfunction) of organic origin 06/03/2012  . Routine general medical examination at a health care facility 04/10/2012  . BPH (benign prostatic hyperplasia) 02/03/2010  . HEMOCCULT POSITIVE STOOL 05/25/2008  . ELBOW PAIN, RIGHT 07/04/2007  . Dyslipidemia 02/16/2007  . ANXIETY 02/16/2007  . ERECTILE DYSFUNCTION 02/16/2007  . Essential hypertension 02/16/2007   . TESTICULAR MASS, LEFT 02/16/2007  . KNEE PAIN, CHRONIC 02/16/2007  . ADENOIDECTOMY, HX OF 02/16/2007    Past Surgical History:  Procedure Laterality Date  . ANTERIOR CERVICAL DECOMP/DISCECTOMY FUSION N/A 02/01/2017   Procedure: ANTERIOR CERVICAL DECOMPRESSION/DISCECTOMY FUSION CERVICAL THREE-FOUR , CERVICAL FOUR-FIVE  CERVICAL FIVE-SIX;  Surgeon: Consuella Lose, MD;  Location: [facility];  Service: Neurosurgery;  Laterality: N/A;  . COLONOSCOPY  02/05/2010   [facility]  . KNEE SURGERY     2 Lknee arthroscopy, 3 R knee arthroscpoy  . KNEE SURGERY Right 11/12/2016  . TONSILLECTOMY         Family History  Problem Relation Age of Onset  . Dementia Mother   . Diabetes Mellitus I Mother   . Hypertension Father        24  . Healthy Sister   . Rheum arthritis Brother   . Cancer Neg Hx   . Colon cancer Neg Hx   . Esophageal cancer Neg Hx   . Stomach cancer Neg Hx   . Rectal cancer Neg Hx   . Colon polyps Neg Hx     Social History   Tobacco Use  . Smoking status: Never Smoker  . Smokeless tobacco: Never Used  Substance Use Topics  . Alcohol use: No  . Drug use: No    Home Medications Prior to Admission medications   Medication Sig Start Date End Date Taking? Authorizing Provider  amitriptyline (ELAVIL) 75 MG tablet TAKE 1 TABLET(75 MG) BY MOUTH AT BEDTIME 03/28/19   Jamse Arn, MD  B Complex Vitamins (B-COMPLEX/B-12 PO) Take by mouth.    [provider]  baclofen (LIORESAL) 10 MG tablet TAKE 2 TABLETS THREE TIMES A DAY 04/10/19   Narda Amber K, DO  celecoxib (CELEBREX) 100 MG capsule TAKE 1 CAPSULE(100 MG) BY MOUTH AT BEDTIME 03/07/19   Patel, Domenick Bookbinder, MD  clotrimazole-betamethasone (LOTRISONE) cream Apply 1 application topically 2 (two) times daily. 11/02/18   Renato Shin, MD  Coenzyme Q10 (COQ10) 200 MG CAPS Take by mouth.    [provider]  Cranberry 400 MG TABS Take 2 each by mouth daily.      [provider]  Cyanocobalamin  (VITAMIN B-12 CR PO) Take 1 each by mouth daily.      [provider]  DULoxetine (CYMBALTA) 60 MG capsule TAKE 1 CAPSULE DAILY 04/04/19   Jamse Arn, MD  finasteride (PROSCAR) 5 MG tablet Take 5 mg by mouth daily.     [provider]  glucagon 1 MG injection Inject 1 mg into the vein once as needed. 07/25/13   Renato Shin, MD  glucose blood (BAYER CONTOUR NEXT TEST) test strip MEDICALLY NECESSARY FOR USE WITH PUMP; Use to check blood sugar 5 times per day and prn; E11.42 02/03/18   Renato Shin, MD  HYDROcodone-acetaminophen (NORCO/VICODIN) 5-325 MG tablet Take 1 tablet by mouth every 6 (six) hours as needed for moderate pain. 05/07/19   Fredia Sorrow, MD  Insulin Infusion Pump Supplies (PARADIGM RESERVOIR 3ML) MISC 1 Device by Does not apply route every 3 (three) days. 08/31/18   Renato Shin, MD  Insulin Infusion Pump Supplies [facility] INFUSION  43" 9MM) MISC 1 Device by Does not apply route every 3 (three) days. 08/31/18   Renato Shin, MD  insulin lispro (HUMALOG) 100 UNIT/ML injection INJECT 120 UNITS DAILY IN PUMP 08/31/18   Renato Shin, MD  lidocaine (LIDODERM) 5 % At 7 am and remove at 7 pm. Apply 1 on each side. 04/25/19   Jamse Arn, MD  lisinopril-hydrochlorothiazide (ZESTORETIC) 10-12.5 MG tablet TAKE 1 TABLET DAILY, PLEASE SEE PRIMARY CARE PHYSICIAN FOR ANY FUTURE REFILLS 08/26/18   Shelda Pal, DO  Multiple Vitamin (MULTIVITAMIN) capsule Take by mouth.    [provider]  Multiple Vitamins-Minerals (MULTIVITAMIN,TX-MINERALS) tablet Take 1 tablet by mouth daily.      [provider]  predniSONE (DELTASONE) 20 MG tablet Take 1 tablet (20 mg total) by mouth as directed. 1 tab twice daily for 2 days then 1 tab once daily for 2 days then off 05/01/19 04/30/20  Meredith Staggers, MD  scopolamine (TRANSDERM-SCOP) 1 MG/3DAYS Place 1 patch (1.5 mg total) onto the skin every 3 (three) days. 03/24/19   Shelda Pal, DO    tiZANidine (ZANAFLEX) 2 MG tablet Take 1 tablet (2 mg total) by mouth every 8 (eight) hours as needed for muscle spasms. 05/01/19   Meredith Staggers, MD    Allergies    Patient has no known allergies.  Review of Systems   Review of Systems  Constitutional: Negative for chills and fever.  HENT: Negative for congestion, rhinorrhea and sore throat.   Eyes: Negative for visual disturbance.  Respiratory: Negative for cough and shortness of breath.   Cardiovascular: Negative for chest pain and leg swelling.  Gastrointestinal: Negative for abdominal pain, diarrhea, nausea and vomiting.  Genitourinary: Negative for dysuria.  Musculoskeletal: Positive for back pain. Negative for neck pain.  Skin: Negative for rash.  Neurological: Negative for dizziness, light-headedness and headaches.  Hematological: Does not bruise/bleed easily.  Psychiatric/Behavioral: Negative for confusion.    Physical Exam Updated Vital Signs BP 138/86 (BP Location: Right Arm)   Pulse (!) 110   Temp 97.7 F (36.5 C) (Oral)   Resp 18   Ht 1.829 m (6')   Wt 107 kg   SpO2 100%   BMI 32.01 kg/m   Physical Exam Vitals and nursing note reviewed.  Constitutional:      Appearance: Normal appearance. He is well-developed.  HENT:     Head: Normocephalic and atraumatic.  Eyes:     Extraocular Movements: Extraocular movements intact.     Conjunctiva/sclera: Conjunctivae normal.     Pupils: Pupils are equal, round, and reactive to light.  Cardiovascular:     Rate and Rhythm: Normal rate and regular rhythm.     Heart sounds: No murmur.  Pulmonary:     Effort: Pulmonary effort is normal. No respiratory distress.     Breath sounds: Normal breath sounds.  Abdominal:     Palpations: Abdomen is soft.     Tenderness: There is no abdominal tenderness.  Musculoskeletal:        General: No swelling. Normal range of motion.     Cervical back: Normal range of motion and neck supple.  Skin:    General: Skin is warm and  dry.  Neurological:     General: No focal deficit present.     Mental Status: He is alert and oriented to person, place, and time.     Cranial Nerves: No cranial nerve deficit.     Sensory: Sensory deficit present.  Motor: No weakness.     Comments: Some subjective numbness to the medial aspect of the right leg.     ED Results / Procedures / Treatments   Labs (all labs ordered are listed, but only abnormal results are displayed) Labs Reviewed  CBC WITH DIFFERENTIAL/PLATELET - Abnormal; Notable for the following components:      Result Value   RBC 5.90 (*)    Hemoglobin 17.5 (*)    All other components within normal limits  BASIC METABOLIC PANEL - Abnormal; Notable for the following components:   Sodium 134 (*)    Glucose, Bld 121 (*)    All other components within normal limits  CBG MONITORING, ED - Abnormal; Notable for the following components:   Glucose-Capillary 155 (*)    All other components within normal limits    EKG None  Radiology MR Lumbar Spine W Wo Contrast (assess for abscess, cord compression)  Result Date: 05/07/2019 CLINICAL DATA:  Low back pain EXAM: MRI LUMBAR SPINE WITHOUT AND WITH CONTRAST TECHNIQUE: Multiplanar and multiecho pulse sequences of the lumbar spine were obtained without and with intravenous contrast. CONTRAST:  40mL GADAVIST GADOBUTROL 1 MMOL/ML IV SOLN COMPARISON:  11/20/2016 FINDINGS: Segmentation:  Standard. Alignment:  3 mm anterolisthesis L4 on L5, new from prior. Vertebrae: No fracture, evidence of discitis, or bone lesion. Mild diffuse intrinsic canal narrowing on the basis of congenitally short pedicles. Conus medullaris and cauda equina: Conus extends to the L2 level. Conus and cauda equina appear normal. Paraspinal and other soft tissues: Negative. Disc levels: T12-L1: Unremarkable disc. Mild bilateral facet arthrosis. No foraminal or canal stenosis. Unchanged. L1-L2: Unremarkable disc. Bilateral facet arthrosis and ligamentum flavum  buckling. No foraminal or canal stenosis. Unchanged. L2-L3: Mild diffuse disc bulge, bilateral facet arthrosis, and ligamentum flavum buckling without significant foraminal or canal stenosis. Unchanged. L3-L4: Diffuse disc bulge with bilateral facet arthrosis and ligamentum flavum buckling results in mild left foraminal stenosis without canal stenosis. Slight progression from prior. L4-L5: New large right paracentral disc extrusion with 2.2 cm cranial extension within the subarticular recess above the L4-5 level (series 8, images 24-26; series 5, image 7). Disc impinges the right L4 nerve root. Diffuse disc bulge and bilateral facet arthrosis and ligamentous hypertrophy contribute to mild-to-moderate bilateral foraminal stenosis. No canal stenosis. L5-S1: Mild disc bulge and mild bilateral facet arthropathy result in mild left foraminal stenosis, unchanged from prior. No canal stenosis. IMPRESSION: 1. New large right paracentral L4-5 disc extrusion with cranial extension within the subarticular recess above the L4-5 level and impinging the right L4 nerve root. 2. Mild progression of lumbar spondylosis at L3-L4 where there is mild left foraminal stenosis at L3-4. 3. Mild to moderate bilateral foraminal stenosis at L4-L5. 4. No canal stenosis at any level. Electronically Signed   By: Davina Poke D.O.   On: 05/07/2019 11:02   DG Hips Bilat W or Wo Pelvis 3-4 Views  Result Date: 05/07/2019 CLINICAL DATA:  Acute right hip pain. EXAM: DG HIP (WITH OR WITHOUT PELVIS) 3-4V BILAT COMPARISON:  None. FINDINGS: There is no evidence of hip fracture or dislocation. There is no evidence of arthropathy or other focal bone abnormality. IMPRESSION: Negative. Electronically Signed   By: Marijo Conception M.D.   On: 05/07/2019 09:19    Procedures Procedures (including critical care time)  Medications Ordered in ED Medications  HYDROcodone-acetaminophen (NORCO/VICODIN) 5-325 MG per tablet 1 tablet (1 tablet Oral Given  05/07/19 0850)  gadobutrol (GADAVIST) 1 MMOL/ML injection 10 mL (  10 mLs Intravenous Contrast Given 05/07/19 1042)  HYDROcodone-acetaminophen (NORCO/VICODIN) 5-325 MG per tablet 1 tablet (1 tablet Oral Given 05/07/19 1119)    ED Course  I have reviewed the triage vital signs and the nursing notes.  Pertinent labs & imaging results that were available during my care of the patient were reviewed by me and considered in my medical decision making (see chart for details).    MDM Rules/Calculators/A&P                      MRI shows herniated disc at L4.  With significant compression on the nerve root.  This probably explains the patient's pain.  It is a right-sided disc herniation.  Will treat with hydrocodone in the meantime have him follow-up with his neurosurgeon.  Labs blood sugar fairly well controlled currently.  X-rays of the pelvis and both hips without any significant abnormalities.    Final Clinical Impression(s) / ED Diagnoses Final diagnoses:  Sciatica of right side    Rx / DC Orders ED Discharge Orders         Ordered    HYDROcodone-acetaminophen (NORCO/VICODIN) 5-325 MG tablet  Every 6 hours PRN     05/07/19 1132           Fredia Sorrow, MD 05/07/19 1138

## 2019-05-08 ENCOUNTER — Ambulatory Visit: Payer: BC Managed Care – PPO | Admitting: Physical Medicine & Rehabilitation

## 2019-05-09 ENCOUNTER — Ambulatory Visit: Payer: BC Managed Care – PPO

## 2019-05-10 ENCOUNTER — Telehealth: Payer: Self-pay

## 2019-05-10 DIAGNOSIS — M5126 Other intervertebral disc displacement, lumbar region: Secondary | ICD-10-CM | POA: Diagnosis not present

## 2019-05-10 NOTE — Telephone Encounter (Signed)
Received notification from [employer] that CMN is needing to be completed for replacement pump. Dr. Loanne Drilling advised he is not able to complete the form as an OV is due. Called pt to schedule appt. LVM requesting returned call.

## 2019-05-11 DIAGNOSIS — M5126 Other intervertebral disc displacement, lumbar region: Secondary | ICD-10-CM | POA: Diagnosis not present

## 2019-05-12 DIAGNOSIS — M25522 Pain in left elbow: Secondary | ICD-10-CM | POA: Diagnosis not present

## 2019-05-15 ENCOUNTER — Other Ambulatory Visit: Payer: Self-pay | Admitting: Neurosurgery

## 2019-05-17 NOTE — Progress Notes (Signed)
[place], [place] [address] [place] [address] Phone: [phone] Fax: [place], [place] [address] [place] [address] Phone: [phone] Fax: [place], [place] [address] [place] [address] Phone: [phone] Fax: [phone]      Your procedure is scheduled on Monday April 5  Report to [facility] Main Entrance "A" at 1:00 P.M., and check in at the Admitting office.  Call this number if you have problems the morning of surgery:  [phone]  Call [phone] if you have any questions prior to your surgery date Monday-Friday 8am-4pm    Remember:  Do not eat or drink after midnight the night before your surgery    Take these medicines the morning of surgery with A SIP OF WATER  baclofen (LIORESAL) DULoxetine (CYMBALTA) finasteride (PROSCAR) HYDROcodone-acetaminophen (NORCO/VICODIN) if needed tiZANidine (ZANAFLEX  As of today, STOP taking any Aspirin (unless otherwise instructed by your surgeon) and Aspirin containing products, Aleve, Naproxen, Ibuprofen, Motrin, Advil, Goody's, BC's, all herbal medications, fish oil, and all vitamins.  Patients with Insulin Pumps    . For patients with Insulin Pumps: o Contact your diabetes doctor for specific instructions before surgery. o Decrease basal insulin rates by 20% at midnight the night before surgery. o Note that if your surgery is planned to be longer than 2 hours, your insulin pump will be removed and intravenous (IV) insulin will be started and managed by the nurses and anesthesiologist. You will be able to restart your insulin pump once you are awake and able to manage it. o Make sure to bring insulin pump supplies to the hospital with you in case your site needs to  be changed.   HOW TO MANAGE YOUR DIABETES BEFORE AND AFTER SURGERY  Why is it important to control my blood sugar before and after surgery? . Improving blood sugar levels before and after surgery helps healing and can limit problems. . A way of improving blood sugar control is eating a healthy diet by: o  Eating less sugar and carbohydrates o  Increasing activity/exercise o  Talking with your doctor about reaching your blood sugar goals . High blood sugars (greater than 180 mg/dL) can raise your risk of infections and slow your recovery, so you will need to focus on controlling your diabetes during the weeks before surgery. . Make sure that the doctor who takes care of your diabetes knows about your planned surgery including the date and location.  How do I manage my blood sugar before surgery? . Check your blood sugar at least 4 times a day, starting 2 days before surgery, to make sure that the level is not too high or low. . Check your blood sugar the morning of your surgery when you wake up and every 2 hours until you get to the Short Stay unit. o If your blood sugar is less than 70 mg/dL, you will need to treat for low blood sugar: - Do not take insulin. - Treat a low blood sugar (less than 70 mg/dL) with  cup of clear juice (cranberry or apple), 4 glucose tablets, OR glucose gel. - Recheck blood sugar in 15 minutes after treatment (to make sure it is greater than 70 mg/dL). If your  blood sugar is not greater than 70 mg/dL on recheck, call [phone] for further instructions. . Report your blood sugar to the short stay nurse when you get to Short Stay.  . If you are admitted to the hospital after surgery: o Your blood sugar will be checked by the staff and you will probably be given insulin after surgery (instead of oral diabetes medicines) to make sure you have good blood sugar levels. o The goal for blood sugar control after surgery is 80-180 mg/dL.                       Do not  wear jewelry            Do not wear lotions, powders, colognes, or deodorant.            Men may shave face and neck.            Do not bring valuables to the hospital.            [facility] is not responsible for any belongings or valuables.  Do NOT Smoke (Tobacco/Vapping) or drink Alcohol 24 hours prior to your procedure If you use a CPAP at night, you may bring all equipment for your overnight stay.   Contacts, glasses, dentures or bridgework may not be worn into surgery.      For patients admitted to the hospital, discharge time will be determined by your treatment team.   Patients discharged the day of surgery will not be allowed to drive home, and someone needs to stay with them for 24 hours.    Special instructions:   [place]- Preparing For Surgery  Before surgery, you can play an important role. Because skin is not sterile, your skin needs to be as free of germs as possible. You can reduce the number of germs on your skin by washing with CHG (chlorahexidine gluconate) Soap before surgery.  CHG is an antiseptic cleaner which kills germs and bonds with the skin to continue killing germs even after washing.    Oral Hygiene is also important to reduce your risk of infection.  Remember - BRUSH YOUR TEETH THE MORNING OF SURGERY WITH YOUR REGULAR TOOTHPASTE  Please do not use if you have an allergy to CHG or antibacterial soaps. If your skin becomes reddened/irritated stop using the CHG.  Do not shave (including legs and underarms) for at least 48 hours prior to first CHG shower. It is OK to shave your face.  Please follow these instructions carefully.   1. Shower the NIGHT BEFORE SURGERY and the MORNING OF SURGERY with CHG Soap.   2. If you chose to wash your hair, wash your hair first as usual with your normal shampoo.  3. After you shampoo, rinse your hair and body thoroughly to remove the shampoo.  4. Use CHG as you would any other liquid soap. You can apply CHG directly  to the skin and wash gently with a scrungie or a clean washcloth.   5. Apply the CHG Soap to your body ONLY FROM THE NECK DOWN.  Do not use on open wounds or open sores. Avoid contact with your eyes, ears, mouth and genitals (private parts). Wash Face and genitals (private parts)  with your normal soap.   6. Wash thoroughly, paying special attention to the area where your surgery will be performed.  7. Thoroughly rinse your body with warm water from the neck down.  8. DO NOT shower/wash  with your normal soap after using and rinsing off the CHG Soap.  9. Pat yourself dry with a CLEAN TOWEL.  10. Wear CLEAN PAJAMAS to bed the night before surgery, wear comfortable clothes the morning of surgery  11. Place CLEAN SHEETS on your bed the night of your first shower and DO NOT SLEEP WITH PETS.   Day of Surgery:   Do not apply any deodorants/lotions.  Please wear clean clothes to the hospital/surgery center.   Remember to brush your teeth WITH YOUR REGULAR TOOTHPASTE.   Please read over the following fact sheets that you were given.

## 2019-05-18 ENCOUNTER — Other Ambulatory Visit: Payer: Self-pay

## 2019-05-18 ENCOUNTER — Other Ambulatory Visit (HOSPITAL_COMMUNITY)
Admission: RE | Admit: 2019-05-18 | Discharge: 2019-05-18 | Disposition: A | Discharge status: Home / Self Care | Payer: BC Managed Care – PPO | Source: Ambulatory Visit | Attending: Neurosurgery | Admitting: Neurosurgery

## 2019-05-18 ENCOUNTER — Encounter (HOSPITAL_COMMUNITY): Payer: Self-pay

## 2019-05-18 ENCOUNTER — Telehealth: Payer: Self-pay | Admitting: Neurology

## 2019-05-18 ENCOUNTER — Encounter (HOSPITAL_COMMUNITY)
Admission: RE | Admit: 2019-05-18 | Discharge: 2019-05-18 | Disposition: A | Discharge status: Home / Self Care | Payer: BC Managed Care – PPO | Source: Ambulatory Visit | Attending: Neurosurgery | Admitting: Neurosurgery

## 2019-05-18 DIAGNOSIS — Z6831 Body mass index (BMI) 31.0-31.9, adult: Secondary | ICD-10-CM | POA: Diagnosis not present

## 2019-05-18 DIAGNOSIS — E785 Hyperlipidemia, unspecified: Secondary | ICD-10-CM | POA: Insufficient documentation

## 2019-05-18 DIAGNOSIS — Z20822 Contact with and (suspected) exposure to covid-19: Secondary | ICD-10-CM | POA: Insufficient documentation

## 2019-05-18 DIAGNOSIS — E118 Type 2 diabetes mellitus with unspecified complications: Secondary | ICD-10-CM | POA: Diagnosis not present

## 2019-05-18 DIAGNOSIS — Z79899 Other long term (current) drug therapy: Secondary | ICD-10-CM | POA: Diagnosis not present

## 2019-05-18 DIAGNOSIS — Z01818 Encounter for other preprocedural examination: Secondary | ICD-10-CM | POA: Insufficient documentation

## 2019-05-18 DIAGNOSIS — Z7901 Long term (current) use of anticoagulants: Secondary | ICD-10-CM | POA: Insufficient documentation

## 2019-05-18 DIAGNOSIS — G4733 Obstructive sleep apnea (adult) (pediatric): Secondary | ICD-10-CM | POA: Diagnosis not present

## 2019-05-18 DIAGNOSIS — M5126 Other intervertebral disc displacement, lumbar region: Secondary | ICD-10-CM | POA: Diagnosis not present

## 2019-05-18 DIAGNOSIS — I10 Essential (primary) hypertension: Secondary | ICD-10-CM | POA: Diagnosis not present

## 2019-05-18 DIAGNOSIS — G6181 Chronic inflammatory demyelinating polyneuritis: Secondary | ICD-10-CM | POA: Diagnosis not present

## 2019-05-18 DIAGNOSIS — E669 Obesity, unspecified: Secondary | ICD-10-CM | POA: Diagnosis not present

## 2019-05-18 DIAGNOSIS — Z794 Long term (current) use of insulin: Secondary | ICD-10-CM | POA: Diagnosis not present

## 2019-05-18 HISTORY — DX: Neuralgia and neuritis, unspecified: M79.2

## 2019-05-18 HISTORY — DX: Chronic inflammatory demyelinating polyneuritis: G61.81

## 2019-05-18 LAB — GLUCOSE, CAPILLARY: Glucose-Capillary: 86 mg/dL (ref 70–99)

## 2019-05-18 LAB — SARS CORONAVIRUS 2 (TAT 6-24 HRS): SARS Coronavirus 2: NEGATIVE

## 2019-05-18 LAB — SURGICAL PCR SCREEN
MRSA, PCR: NEGATIVE
Staphylococcus aureus: NEGATIVE

## 2019-05-18 NOTE — Anesthesia Preprocedure Evaluation (Addendum)
Anesthesia Evaluation  Patient identified by MRN, date of birth, ID band Patient awake    Reviewed: Allergy & Precautions, NPO status , Patient's Chart, lab work & pertinent test results  History of Anesthesia Complications Negative for: history of anesthetic complications  Airway Mallampati: II  TM Distance: >3 FB Neck ROM: Full    Dental  (+) Caps, Dental Advisory Given   Pulmonary sleep apnea and Continuous Positive Airway Pressure Ventilation ,    Pulmonary exam normal        Cardiovascular hypertension, Normal cardiovascular exam     Neuro/Psych PSYCHIATRIC DISORDERS Anxiety Depression    GI/Hepatic negative GI ROS, Neg liver ROS,   Endo/Other  diabetesInsulin Pump  Renal/GU negative Renal ROS     Musculoskeletal negative musculoskeletal ROS (+) Paget's Disease   Abdominal   Peds  Hematology negative hematology ROS (+)   Anesthesia Other Findings Day of surgery medications reviewed with the patient.  Reproductive/Obstetrics                            Anesthesia Physical Anesthesia Plan  ASA: III  Anesthesia Plan: General   Post-op Pain Management:    Induction: Intravenous  PONV Risk Score and Plan: 3 and Ondansetron, Dexamethasone and Diphenhydramine  Airway Management Planned:   Additional Equipment:   Intra-op Plan:   Post-operative Plan: Extubation in OR  Informed Consent: I have reviewed the patients History and Physical, chart, labs and discussed the procedure including the risks, benefits and alternatives for the proposed anesthesia with the patient or authorized representative who has indicated his/her understanding and acceptance.     Dental advisory given  Plan Discussed with: Anesthesiologist and CRNA  Anesthesia Plan Comments: (See PAT note written 05/18/2019 by Myra Gianotti, PA-C. DM1 with insulin pump, OSA (CPAP), HTN, chronic inflammatory  demyelinating polyradiculoneuropathy (diagnosed 11/2016, IVIG 1mg /kg every 6 weeks for paresthesias), C3-6 ACDF 02/01/17. Possible Paget's disease involving left elbow.   Labs will be 76 days old, so not redrawn. If updated labs other than CBG desired then please let [facility] RN staff know.   )      Anesthesia Quick Evaluation

## 2019-05-18 NOTE — Progress Notes (Addendum)
PCP - Dr. Crosby Oyster [facility] Cardiologist - denies Endocrinologist - Dr. Renato Shin  PPM/ICD - denies  Chest x-ray - N/A  EKG - 05/18/2019 Stress Test - per patient "about 10 years ago for a work physical, no follow-up needed" ECHO - per patient "about 10 years ago for a work physical, no follow-up needed" Cardiac Cath - denies  Sleep Study - YES CPAP - per patient "normal wears every night, hasn't been wearing it for the past 2 weeks due to having to sleep down stairs on a roll-out bed due to hip pain"  DM: Type I, on insulin pump. Patient and patient's wife given instructions to contact diabetes doctor for specific instructions before surgery.  Checks CBG - about 3 times daily. Fasting blood sugar - 100 - 200  Blood Thinner Instructions: N/A Aspirin Instructions: N/A  ERAS Protcol - No orders  COVID TEST- Scheduled for today 05/18/2019 after PAT appointment  Anesthesia review: YES, insulin pump.   Wife had question about not having repeat labs done today 05/18/2019 due to recent labs on 05/07/2019. Spoke with Jeneen Rinks PA, said it was ok not to draw labs today. Patient and his wife made aware that if the doctor decides to have labs done on the day of surgery, then it will be drawn then. Verbal understanding given by both persons.  Patient denies shortness of breath, fever, cough and chest pain at PAT appointment  All instructions explained to the patient, with a verbal understanding of the material. Patient agrees to go over the instructions while at home for a better understanding. Patient also instructed to self quarantine after being tested for COVID-19. The opportunity to ask questions was provided.

## 2019-05-18 NOTE — Progress Notes (Signed)
Anesthesia Chart Review:  Case: [id number] Date/Time: 05/22/19 1445   Procedure: LAMINOTOMY AND MICRODISCECTOMY RIGHT LUMBAR 4- LUMBAR 5 (Right ) - 3C   Anesthesia type: General   Pre-op diagnosis: HERNIATED NUCLEUS PULPOSUS, LUMBAR   Location: [facility] OR ROOM 20 / [place] OR   Surgeons: Consuella Lose, MD      DISCUSSION: Patient is a 69 year old male scheduled for the above procedure.   History includes never smoker, DM1, HTN, HLD, BPH (s/p TURP 2012), OSA (CPAP), chronic inflammatory demyelinating polyradiculoneuropathy (diagnosed 11/2016, IVIG 1mg /kg every 6 weeks for paresthesias), C3-6 ACDF 02/01/17. BMI is consistent with obesity.   Earlier this year, he was evaluated at [facility] by orthopedics and ortho-oncology for possible Paget's disease. He has had increasing left elbow pain. Xray showed expansion of the distal humeral metadiaphysis with mixed lytic and sclerotic appearance favored to reflect sequela of Paget's disease versus chronic osteomyelitis. He underwent bone scan and MRI. MRI findings nonspecific but can be seen with Paget's disease--chronic osteomyelitis thought less likely. Non-operative management discussed. He was getting OT. Six month follow-up with Dr. Mylo Red (ortho-onc) recomended.   I ordered DM Coordinator consult given insulin pump, A1c 8.8% 03/2019. Dr. Loanne Drilling is his primary endocrinologist, but Dr. Kelton Pillar made recent adjustments to his regimen last month.   Presurgical COVID-19 test is scheduled for 05/18/19. Patient is in communication with neurology regarding scheduling next IVIG around his surgery date. Anesthesia team to evaluate on the day of surgery.    VS: BP (!) 145/83   Pulse 83   Temp 36.7 C (Oral)   Resp 18   Ht 6' (1.829 m)   Wt 106.6 kg   SpO2 100%   BMI 31.87 kg/m    PROVIDERS: Shelda Pal, DO is PCP  - Renato Shin, MD is endocrinologist. Last encounter 05/01/19 seen with Collier Flowers, MD for hyperglycemia with recent steroid  intraarticular injection with plans for 4 days of oral prednisone. He has a Medtronic insulin pump. A1c 8.8% 03/24/19. The following changes made: Change SF from 50 to 40, goal remains at 100, bolus every 4 hours rather then 2 to avoid insulin stacking, no change in basal rate.   Narda Amber, DO is neurologist. Last visit 01/23/19.  Janice Coffin, MD is Ortho ONC. Also saw orthopedist Art Buff, MD on 05/12/19 (see [place])   LABS: Patient had labs on 05/07/19 which will be 15 days out from surgery date. Patient preferred not to have labs redrawn given recent labs draw. I think that is reasonable; however, if surgeon or anesthesiologist want updated labs then these would need to be done on the day of surgery. A1c 8.8% on 03/24/19 at PCP visit--some insulin adjustments made on 05/01/19. CBG 86 at PAT. (all labs ordered are listed, but only abnormal results are displayed)  Labs Reviewed  SURGICAL PCR SCREEN  GLUCOSE, CAPILLARY   Lab Results  Component Value Date   WBC 8.4 05/07/2019   HGB 17.5 (H) 05/07/2019   HCT 50.9 05/07/2019   PLT 251 05/07/2019   LDLDIRECT 147.0 03/24/2019   ALT 19 03/24/2019   AST 16 03/24/2019   NA 134 (L) 05/07/2019   K 3.7 05/07/2019   CL 102 05/07/2019   CREATININE 0.74 05/07/2019   BUN 23 05/07/2019   CO2 24 05/07/2019   PSA 1.40 03/24/2019   HGBA1C 8.8 (H) 03/24/2019     IMAGES: MRI L-spine 05/07/19: IMPRESSION: 1. New large right paracentral L4-5 disc extrusion with  cranial extension within the subarticular recess above the L4-5 level and impinging the right L4 nerve root. 2. Mild progression of lumbar spondylosis at L3-L4 where there is mild left foraminal stenosis at L3-4. 3. Mild to moderate bilateral foraminal stenosis at L4-L5. 4. No canal stenosis at any level.  MRI LUE 04/28/19 [facility]): Result Impression Remodeling and expansion of the distal left humerus with heterogeneous marrow signal and areas of patchy  enhancement. Findings are nonspecific but can be seen with Paget's disease. Chronic osteomyelitis is thought less likely as there is no cortical defect or elbow joint involvement.   Bone Scan 04/04/19 [facility]): Result Impression 1. Query Paget's-related complication in the distal left humerus, such as fracture or malignancy. See findings, above.  2. Increased tracer uptake in left frontal/sphenoid bone superolateral to the left orbit, may represent an additional site of Paget's vs. fibrous dysplasia. CT head study can be obtained for further evaluation.   EKG: 05/18/19: NSR   CV: He reported a prior stress and ech ~ 10 years ago as part of a work physical and "no follow-up needed".    Past Medical History:  Diagnosis Date  . ADENOIDECTOMY, HX OF 02/16/2007  . Anxiety    pt. states he does not have anxiety.  Marland Kitchen BENIGN PROSTATIC HYPERTROPHY, WITH OBSTRUCTION 02/03/2010  . DIABETES MELLITUS, TYPE I, UNCONTROLLED 12/29/2006  . ERECTILE DYSFUNCTION 02/16/2007  . HYPERLIPIDEMIA 02/16/2007  . HYPERTENSION 02/16/2007  . Nerve pain    per patient "on lower back and both legs"  . Sleep apnea    uses CPAP  . TESTICULAR MASS, LEFT 02/16/2007    Past Surgical History:  Procedure Laterality Date  . ANTERIOR CERVICAL DECOMP/DISCECTOMY FUSION N/A 02/01/2017   Procedure: ANTERIOR CERVICAL DECOMPRESSION/DISCECTOMY FUSION CERVICAL THREE-FOUR , CERVICAL FOUR-FIVE  CERVICAL FIVE-SIX;  Surgeon: Consuella Lose, MD;  Location: [place];  Service: Neurosurgery;  Laterality: N/A;  . APPENDECTOMY    . COLONOSCOPY  02/05/2010   [facility]  . KNEE SURGERY     2 Lknee arthroscopy, 3 R knee arthroscpoy  . KNEE SURGERY Right 11/12/2016  . TONSILLECTOMY      MEDICATIONS: . amitriptyline (ELAVIL) 75 MG tablet  . B Complex Vitamins (B-COMPLEX/B-12 PO)  . baclofen (LIORESAL) 10 MG tablet  . celecoxib (CELEBREX) 100 MG capsule  . clotrimazole-betamethasone (LOTRISONE) cream  . Cranberry 400 MG TABS  .  Cyanocobalamin (VITAMIN B-12 CR PO)  . DULoxetine (CYMBALTA) 60 MG capsule  . finasteride (PROSCAR) 5 MG tablet  . glucagon 1 MG injection  . glucose blood (BAYER CONTOUR NEXT TEST) test strip  . HYDROcodone-acetaminophen (NORCO/VICODIN) 5-325 MG tablet  . Insulin Infusion Pump Supplies (PARADIGM RESERVOIR 3ML) MISC  . Insulin Infusion Pump Supplies (QUICK-SET INFUSION 43" 9MM) MISC  . insulin lispro (HUMALOG) 100 UNIT/ML injection  . lidocaine (LIDODERM) 5 %  . lisinopril-hydrochlorothiazide (ZESTORETIC) 10-12.5 MG tablet  . Multiple Vitamin (MULTIVITAMIN) capsule  . predniSONE (DELTASONE) 20 MG tablet  . scopolamine (TRANSDERM-SCOP) 1 MG/3DAYS  . tiZANidine (ZANAFLEX) 2 MG tablet   No current facility-administered medications for this encounter.    Myra Gianotti, PA-C Surgical Short Stay/Anesthesiology [facility] Phone [phone] [facility] Phone [phone] 05/18/2019 5:01 PM

## 2019-05-18 NOTE — Telephone Encounter (Signed)
Patient's wife called and said the patient is having surgery on Monday, 05/22/19, for a herniated disk. He also has an IVIG schedule next Friday, 05/26/19. She like to move that to next Tuesday but needs a new order sent.  She'd like a call from a nurse.

## 2019-05-18 NOTE — Telephone Encounter (Signed)
Patient is having surgery on Monday. They are going to check with surgeon and see if they can get this done while he is there.

## 2019-05-22 ENCOUNTER — Ambulatory Visit (HOSPITAL_COMMUNITY): Payer: BC Managed Care – PPO | Admitting: Physician Assistant

## 2019-05-22 ENCOUNTER — Encounter (HOSPITAL_COMMUNITY): Payer: Self-pay | Admitting: Neurosurgery

## 2019-05-22 ENCOUNTER — Encounter (HOSPITAL_COMMUNITY)
Admission: RE | Disposition: A | Discharge status: Home / Self Care | Payer: Self-pay | Source: Home / Self Care | Attending: Neurosurgery

## 2019-05-22 ENCOUNTER — Ambulatory Visit (HOSPITAL_COMMUNITY): Payer: BC Managed Care – PPO | Admitting: Anesthesiology

## 2019-05-22 ENCOUNTER — Telehealth: Payer: Self-pay

## 2019-05-22 ENCOUNTER — Ambulatory Visit (HOSPITAL_COMMUNITY): Payer: BC Managed Care – PPO

## 2019-05-22 ENCOUNTER — Other Ambulatory Visit: Payer: Self-pay

## 2019-05-22 ENCOUNTER — Observation Stay (HOSPITAL_COMMUNITY)
Admission: RE | Admit: 2019-05-22 | Discharge: 2019-05-23 | Disposition: A | Discharge status: Home / Self Care | Payer: BC Managed Care – PPO | Attending: Neurosurgery | Admitting: Neurosurgery

## 2019-05-22 DIAGNOSIS — R2689 Other abnormalities of gait and mobility: Secondary | ICD-10-CM | POA: Diagnosis not present

## 2019-05-22 DIAGNOSIS — F411 Generalized anxiety disorder: Secondary | ICD-10-CM | POA: Insufficient documentation

## 2019-05-22 DIAGNOSIS — Z833 Family history of diabetes mellitus: Secondary | ICD-10-CM | POA: Insufficient documentation

## 2019-05-22 DIAGNOSIS — R296 Repeated falls: Secondary | ICD-10-CM | POA: Insufficient documentation

## 2019-05-22 DIAGNOSIS — I10 Essential (primary) hypertension: Secondary | ICD-10-CM | POA: Diagnosis not present

## 2019-05-22 DIAGNOSIS — G8929 Other chronic pain: Secondary | ICD-10-CM | POA: Insufficient documentation

## 2019-05-22 DIAGNOSIS — N319 Neuromuscular dysfunction of bladder, unspecified: Secondary | ICD-10-CM | POA: Insufficient documentation

## 2019-05-22 DIAGNOSIS — E113293 Type 2 diabetes mellitus with mild nonproliferative diabetic retinopathy without macular edema, bilateral: Secondary | ICD-10-CM | POA: Insufficient documentation

## 2019-05-22 DIAGNOSIS — Z820 Family history of epilepsy and other diseases of the nervous system: Secondary | ICD-10-CM | POA: Insufficient documentation

## 2019-05-22 DIAGNOSIS — R531 Weakness: Secondary | ICD-10-CM | POA: Diagnosis not present

## 2019-05-22 DIAGNOSIS — M5416 Radiculopathy, lumbar region: Secondary | ICD-10-CM | POA: Diagnosis present

## 2019-05-22 DIAGNOSIS — E1136 Type 2 diabetes mellitus with diabetic cataract: Secondary | ICD-10-CM | POA: Diagnosis not present

## 2019-05-22 DIAGNOSIS — G6181 Chronic inflammatory demyelinating polyneuritis: Secondary | ICD-10-CM | POA: Diagnosis not present

## 2019-05-22 DIAGNOSIS — M545 Low back pain: Secondary | ICD-10-CM | POA: Diagnosis not present

## 2019-05-22 DIAGNOSIS — M4712 Other spondylosis with myelopathy, cervical region: Secondary | ICD-10-CM | POA: Insufficient documentation

## 2019-05-22 DIAGNOSIS — F329 Major depressive disorder, single episode, unspecified: Secondary | ICD-10-CM | POA: Insufficient documentation

## 2019-05-22 DIAGNOSIS — Z7952 Long term (current) use of systemic steroids: Secondary | ICD-10-CM | POA: Insufficient documentation

## 2019-05-22 DIAGNOSIS — E785 Hyperlipidemia, unspecified: Secondary | ICD-10-CM | POA: Diagnosis not present

## 2019-05-22 DIAGNOSIS — M25561 Pain in right knee: Secondary | ICD-10-CM | POA: Insufficient documentation

## 2019-05-22 DIAGNOSIS — M5116 Intervertebral disc disorders with radiculopathy, lumbar region: Principal | ICD-10-CM | POA: Insufficient documentation

## 2019-05-22 DIAGNOSIS — R338 Other retention of urine: Secondary | ICD-10-CM | POA: Insufficient documentation

## 2019-05-22 DIAGNOSIS — Z791 Long term (current) use of non-steroidal anti-inflammatories (NSAID): Secondary | ICD-10-CM | POA: Insufficient documentation

## 2019-05-22 DIAGNOSIS — Z419 Encounter for procedure for purposes other than remedying health state, unspecified: Secondary | ICD-10-CM

## 2019-05-22 DIAGNOSIS — M5126 Other intervertebral disc displacement, lumbar region: Secondary | ICD-10-CM | POA: Diagnosis not present

## 2019-05-22 DIAGNOSIS — R269 Unspecified abnormalities of gait and mobility: Secondary | ICD-10-CM | POA: Insufficient documentation

## 2019-05-22 DIAGNOSIS — Z79899 Other long term (current) drug therapy: Secondary | ICD-10-CM | POA: Insufficient documentation

## 2019-05-22 DIAGNOSIS — Z981 Arthrodesis status: Secondary | ICD-10-CM | POA: Diagnosis not present

## 2019-05-22 DIAGNOSIS — E876 Hypokalemia: Secondary | ICD-10-CM | POA: Insufficient documentation

## 2019-05-22 DIAGNOSIS — E871 Hypo-osmolality and hyponatremia: Secondary | ICD-10-CM | POA: Diagnosis not present

## 2019-05-22 DIAGNOSIS — G4730 Sleep apnea, unspecified: Secondary | ICD-10-CM | POA: Diagnosis not present

## 2019-05-22 DIAGNOSIS — Z8249 Family history of ischemic heart disease and other diseases of the circulatory system: Secondary | ICD-10-CM | POA: Insufficient documentation

## 2019-05-22 DIAGNOSIS — Z794 Long term (current) use of insulin: Secondary | ICD-10-CM | POA: Insufficient documentation

## 2019-05-22 DIAGNOSIS — F418 Other specified anxiety disorders: Secondary | ICD-10-CM | POA: Diagnosis not present

## 2019-05-22 DIAGNOSIS — R0602 Shortness of breath: Secondary | ICD-10-CM | POA: Diagnosis not present

## 2019-05-22 HISTORY — PX: LUMBAR LAMINECTOMY/DECOMPRESSION MICRODISCECTOMY: SHX5026

## 2019-05-22 LAB — GLUCOSE, CAPILLARY
Glucose-Capillary: 211 mg/dL — ABNORMAL HIGH (ref 70–99)
Glucose-Capillary: 135 mg/dL — ABNORMAL HIGH (ref 70–99)
Glucose-Capillary: 114 mg/dL — ABNORMAL HIGH (ref 70–99)
Glucose-Capillary: 115 mg/dL — ABNORMAL HIGH (ref 70–99)

## 2019-05-22 IMAGING — MR MR THORACIC SPINE WO/W CM
4 of 9 series · 18 of 48 positions shown · IV contrast (multihance)
Comparison: None available.

CLINICAL DATA: Initial evaluation for inflammatory polyneuropathy.

EXAM:
MRI THORACIC WITHOUT AND WITH CONTRAST
TECHNIQUE: Multiplanar and multiecho pulse sequences of the thoracic spine were
obtained without and with intravenous contrast.
CONTRAST:  20mL MULTIHANCE GADOBENATE DIMEGLUMINE 529 MG/ML IV SOLN

[Series 4: T2 · sagittal · 3.0mm · 0.68mm/px · 3 of 12 slices shown (1 of 2)]
[im 1/12]
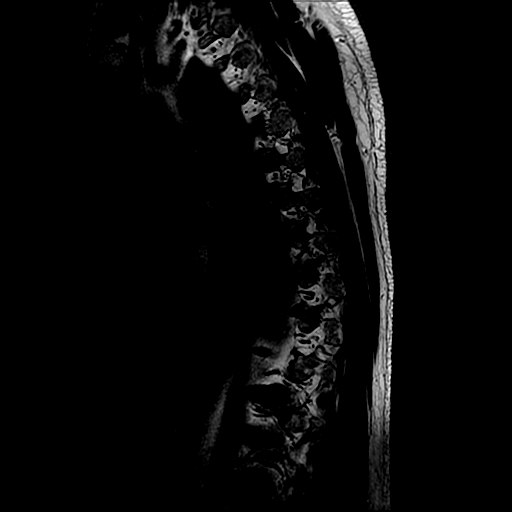
[im 6/12]
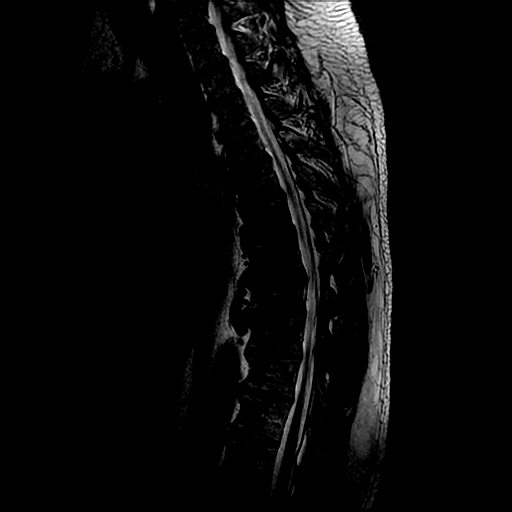
[im 12/12]
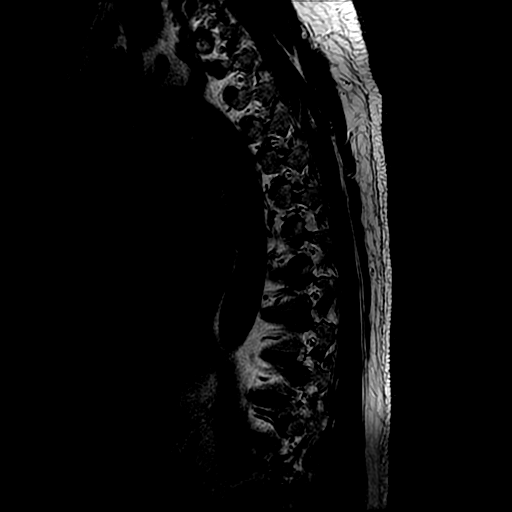

[Series 5: T1 · sagittal · 3.0mm · 0.68mm/px · 2 of 12 slices shown (1 of 2)]
[im 1/12]
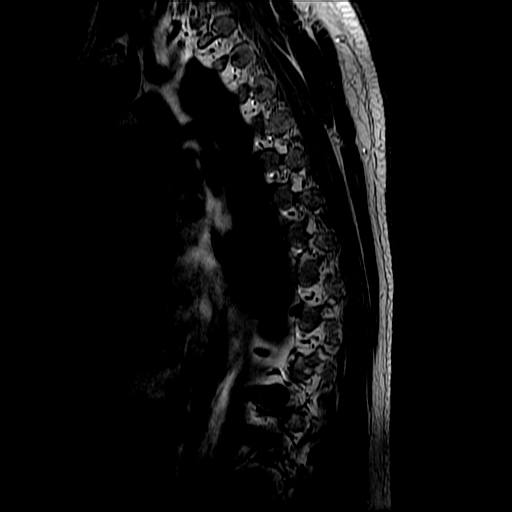
[im 12/12]
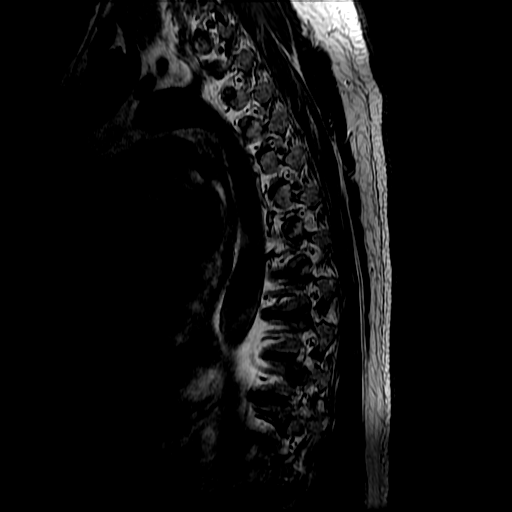

[Series 7: T2 · axial · 4.0mm · 0.43mm/px · z∈[-284,-15]mm · 9 of 44 slices shown (2 of 2)]
[im 1/44]
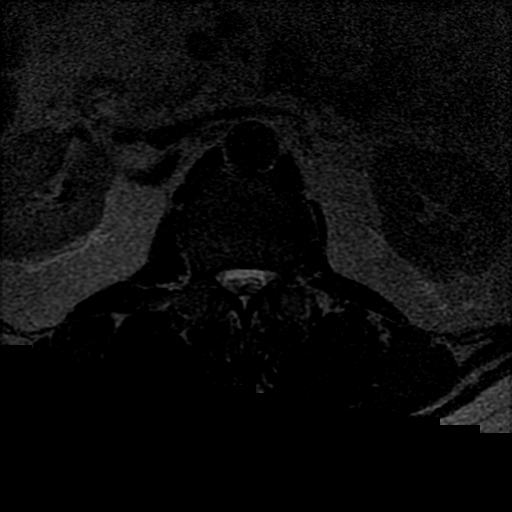
[im 6/44]
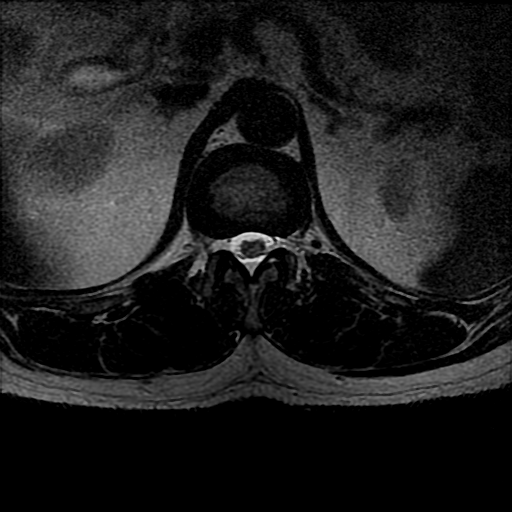
[im 11/44]
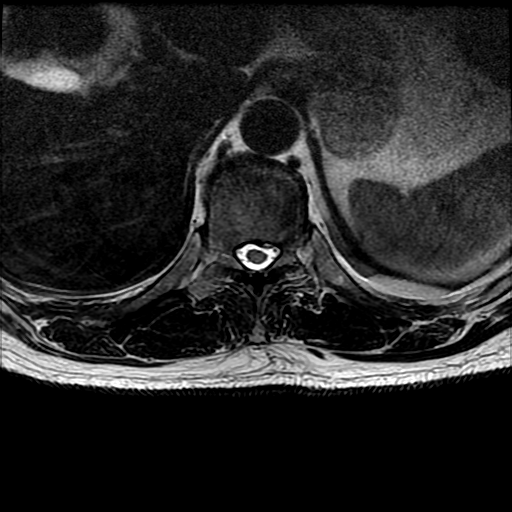
[im 17/44]
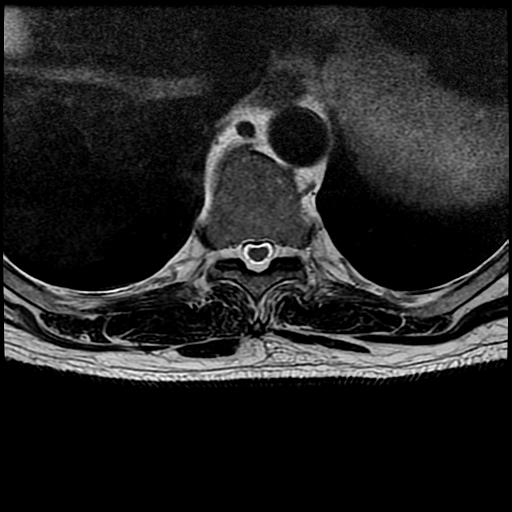
[im 22/44]
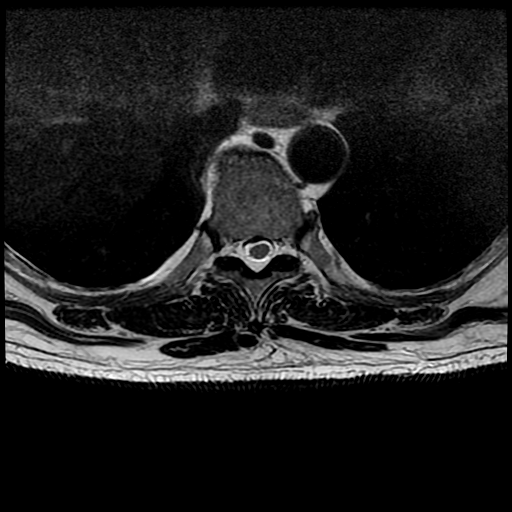
[im 27/44]
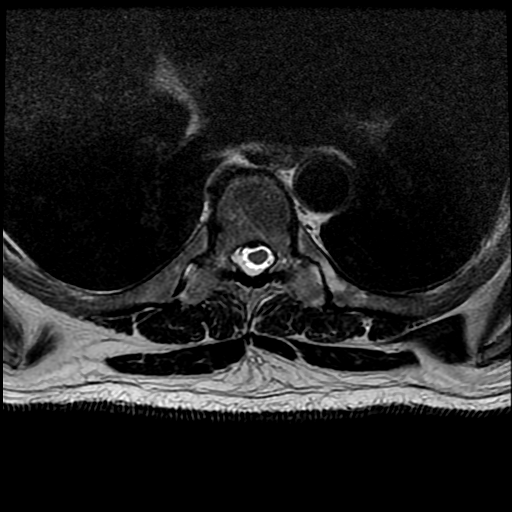
[im 33/44]
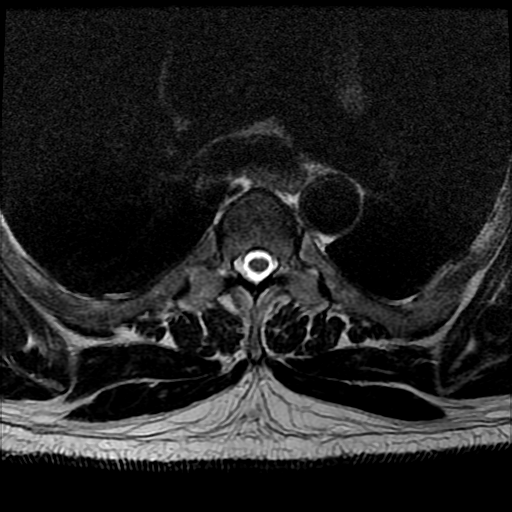
[im 38/44]
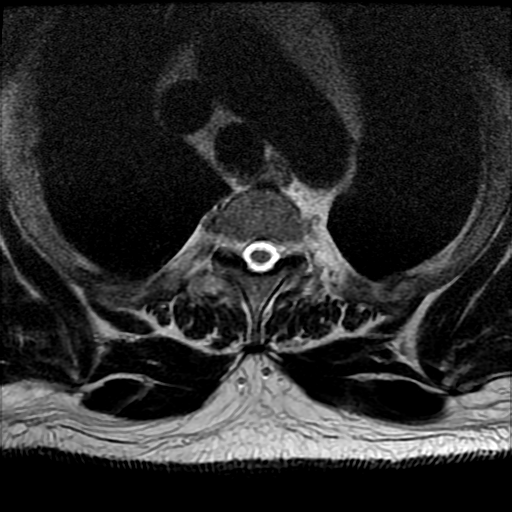
[im 44/44]
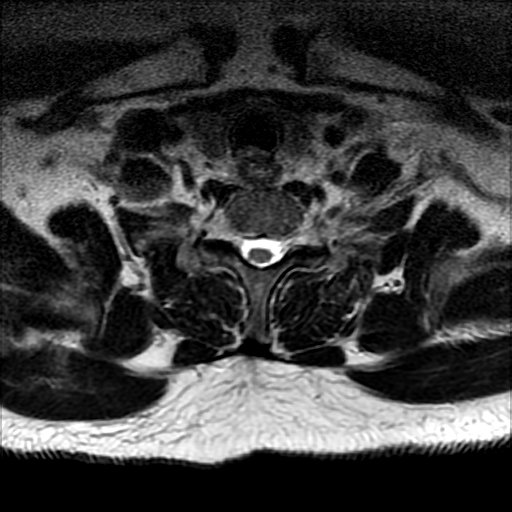

[Series 9: T1 · axial · non-contrast · 4.0mm · 0.43mm/px · z∈[-284,-64]mm · 4 of 44 slices shown (2 of 2)]
[im 1/44]
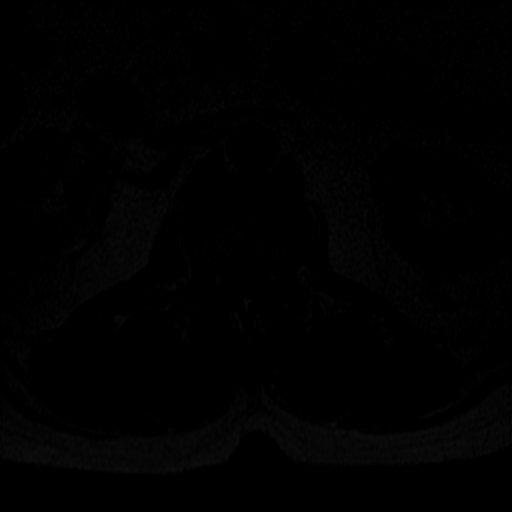
[im 6/44]
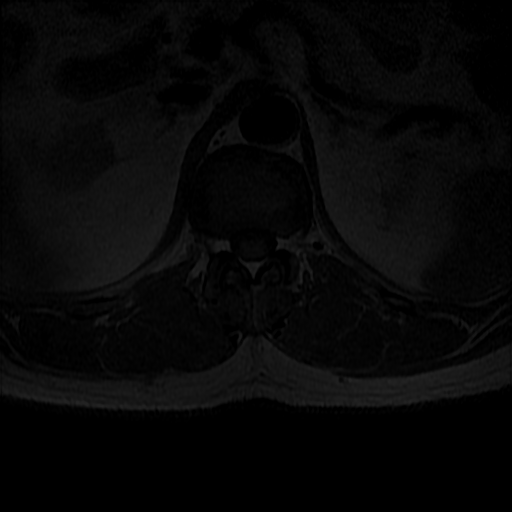
[im 22/44]
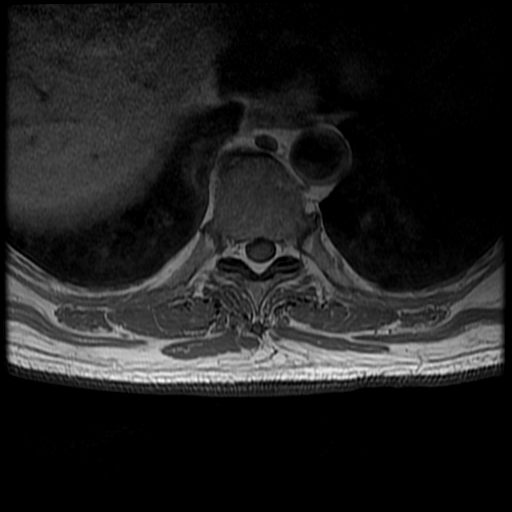
[im 38/44]
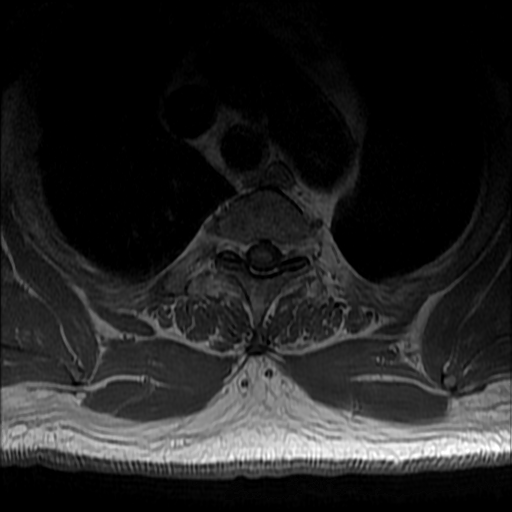

[18 of 48 positions shown; findings below may reference images not displayed]

FINDINGS: MRI THORACIC SPINE FINDINGS

Alignment: Vertebral bodies normally aligned with preservation of
the normal thoracic kyphosis. No listhesis.

Vertebrae: Vertebral body heights maintained. No evidence for acute
or chronic fracture. Bone marrow signal intensity within normal
limits. Prominent hemangioma noted within the right posterior
aspect/pedicle of the T8 vertebral body. No other discrete or
worrisome osseous lesions. No abnormal edema or enhancement.

Cord: Signal intensity within the thoracic spinal cord is normal. No
cord signal abnormality or abnormal enhancement. No appreciable
nerve root edema or enlargement. No abnormal nerve root enhancement.

Paraspinal and other soft tissues: Paraspinous soft tissues within
normal limits.

Disc levels:

T1-2:  Minimal disc bulge.  No stenosis.

T2-3: Unremarkable.

T3-4:  Unremarkable.

T4-5: Shallow right paracentral/ foraminal disc protrusion minimally
indents the ventral thecal sac. No stenosis.

T5-6: Right paracentral disc protrusion indents the right ventral
thecal sac and flattens the right hemi cord (series 7, image 17). No
cord signal changes. No significant canal stenosis. Mild right
foraminal narrowing.

T6-7: Small right central disc protrusion indents the ventral thecal
sac and flattens the right hemi cord (series 7, image 20). No cord
signal changes or significant stenosis.

T7-8: Right paracentral disc protrusion flattens the right hemi cord
without cord signal changes (series 7, image 25). Slight inferior
migration of disc material (series 4, image 7). Central canal
remains widely patent. No foraminal encroachment.

T8-9: Small central disc protrusion abuts and mildly flattens the
ventral cord without cord signal changes (series 7, image 30). No
significant stenosis.

T9-10: Left paracentral disc protrusion flattens the left hemi cord
without cord signal changes (series 7, image 33). No significant
stenosis.

T10-11:  Mild facet hypertrophy.  No stenosis.

T11-12:  Unremarkable.

T12-L1:  Mild facet hypertrophy.  No stenosis.
IMPRESSION: 1. No acute abnormality within the thoracic spine. No abnormal
enhancement or cord signal abnormality.
2. Multifocal disc protrusions with secondary cord flattening at
T5-6, T6-7, T7-8, and T8-9, and T9-10. No significant stenosis
within the thoracic spine.

## 2019-05-22 SURGERY — LUMBAR LAMINECTOMY/DECOMPRESSION MICRODISCECTOMY 1 LEVEL
Anesthesia: General | Site: Spine Lumbar | Laterality: Right

## 2019-05-22 MED ORDER — ONDANSETRON HCL 4 MG/2ML IJ SOLN: 4.0000 mg | Freq: Four times a day (QID) | INTRAMUSCULAR | Status: DC | PRN

## 2019-05-22 MED ORDER — DEXAMETHASONE SODIUM PHOSPHATE 10 MG/ML IJ SOLN
INTRAMUSCULAR | Status: DC | PRN
  Administered 2019-05-22: 4 mg via INTRAVENOUS

## 2019-05-22 MED ORDER — DIPHENHYDRAMINE HCL 50 MG/ML IJ SOLN
INTRAMUSCULAR | Status: AC
  Filled 2019-05-22: qty 1

## 2019-05-22 MED ORDER — EPHEDRINE SULFATE-NACL 50-0.9 MG/10ML-% IV SOSY
PREFILLED_SYRINGE | INTRAVENOUS | Status: DC | PRN
  Administered 2019-05-22: 5 mg via INTRAVENOUS
  Administered 2019-05-22: 10 mg via INTRAVENOUS

## 2019-05-22 MED ORDER — CELECOXIB 200 MG PO CAPS
200.0000 mg | ORAL_CAPSULE | Freq: Once | ORAL | Status: AC
  Administered 2019-05-22: 200 mg via ORAL
  Filled 2019-05-22: qty 1

## 2019-05-22 MED ORDER — ZOLPIDEM TARTRATE 5 MG PO TABS
5.0000 mg | ORAL_TABLET | Freq: Every evening | ORAL | Status: DC | PRN
  Administered 2019-05-22: 5 mg via ORAL
  Filled 2019-05-22: qty 1

## 2019-05-22 MED ORDER — HYDROCHLOROTHIAZIDE 12.5 MG PO CAPS
12.5000 mg | ORAL_CAPSULE | Freq: Every day | ORAL | Status: DC
  Administered 2019-05-23: 10:00:00 12.5 mg via ORAL
  Filled 2019-05-22: qty 1

## 2019-05-22 MED ORDER — ACETAMINOPHEN 500 MG PO TABS
1000.0000 mg | ORAL_TABLET | Freq: Once | ORAL | Status: AC
  Administered 2019-05-22: 1000 mg via ORAL
  Filled 2019-05-22: qty 2

## 2019-05-22 MED ORDER — AMITRIPTYLINE HCL 75 MG PO TABS
75.0000 mg | ORAL_TABLET | Freq: Every day | ORAL | Status: DC
  Filled 2019-05-22 (×2): qty 1

## 2019-05-22 MED ORDER — ROCURONIUM BROMIDE 10 MG/ML (PF) SYRINGE
PREFILLED_SYRINGE | INTRAVENOUS | Status: DC | PRN
  Administered 2019-05-22: 20 mg via INTRAVENOUS
  Administered 2019-05-22: 60 mg via INTRAVENOUS

## 2019-05-22 MED ORDER — CEFAZOLIN SODIUM 1 G IJ SOLR
INTRAMUSCULAR | Status: AC
  Filled 2019-05-22: qty 20

## 2019-05-22 MED ORDER — MIDAZOLAM HCL 2 MG/2ML IJ SOLN
INTRAMUSCULAR | Status: DC | PRN
  Administered 2019-05-22: 2 mg via INTRAVENOUS

## 2019-05-22 MED ORDER — SODIUM CHLORIDE (PF) 0.9 % IJ SOLN
INTRAMUSCULAR | Status: AC
  Filled 2019-05-22: qty 10

## 2019-05-22 MED ORDER — PHENYLEPHRINE 40 MCG/ML (10ML) SYRINGE FOR IV PUSH (FOR BLOOD PRESSURE SUPPORT)
PREFILLED_SYRINGE | INTRAVENOUS | Status: DC | PRN
  Administered 2019-05-22: 80 ug via INTRAVENOUS
  Administered 2019-05-22: 120 ug via INTRAVENOUS
  Administered 2019-05-22 (×2): 80 ug via INTRAVENOUS
  Administered 2019-05-22: 40 ug via INTRAVENOUS

## 2019-05-22 MED ORDER — DIPHENHYDRAMINE HCL 50 MG/ML IJ SOLN
INTRAMUSCULAR | Status: DC | PRN
  Administered 2019-05-22: 12.5 mg via INTRAVENOUS

## 2019-05-22 MED ORDER — LISINOPRIL-HYDROCHLOROTHIAZIDE 10-12.5 MG PO TABS: 1.0000 | ORAL_TABLET | Freq: Every day | ORAL | Status: DC

## 2019-05-22 MED ORDER — METHYLPREDNISOLONE ACETATE 80 MG/ML IJ SUSP
INTRAMUSCULAR | Status: AC
  Filled 2019-05-22: qty 1

## 2019-05-22 MED ORDER — LIDOCAINE-EPINEPHRINE 1 %-1:100000 IJ SOLN
INTRAMUSCULAR | Status: DC | PRN
  Administered 2019-05-22: 5 mL

## 2019-05-22 MED ORDER — PROPOFOL 10 MG/ML IV BOLUS
INTRAVENOUS | Status: DC | PRN
  Administered 2019-05-22: 120 mg via INTRAVENOUS

## 2019-05-22 MED ORDER — PHENYLEPHRINE HCL-NACL 10-0.9 MG/250ML-% IV SOLN
INTRAVENOUS | Status: DC | PRN
  Administered 2019-05-22: 25 ug/min via INTRAVENOUS

## 2019-05-22 MED ORDER — "QUICK-SET INFUSION 43"" 9MM MISC": 1.0000 | Status: DC

## 2019-05-22 MED ORDER — SODIUM CHLORIDE 0.9% FLUSH: 3.0000 mL | INTRAVENOUS | Status: DC | PRN

## 2019-05-22 MED ORDER — ONDANSETRON HCL 4 MG PO TABS: 4.0000 mg | ORAL_TABLET | Freq: Four times a day (QID) | ORAL | Status: DC | PRN

## 2019-05-22 MED ORDER — ACETAMINOPHEN 650 MG RE SUPP: 650.0000 mg | RECTAL | Status: DC | PRN

## 2019-05-22 MED ORDER — MIDAZOLAM HCL 2 MG/2ML IJ SOLN
INTRAMUSCULAR | Status: AC
  Filled 2019-05-22: qty 2

## 2019-05-22 MED ORDER — SCOPOLAMINE 1 MG/3DAYS TD PT72
1.0000 | MEDICATED_PATCH | Freq: Every day | TRANSDERMAL | Status: DC | PRN
  Filled 2019-05-22: qty 1

## 2019-05-22 MED ORDER — FENTANYL CITRATE (PF) 250 MCG/5ML IJ SOLN
INTRAMUSCULAR | Status: AC
  Filled 2019-05-22: qty 5

## 2019-05-22 MED ORDER — THROMBIN 5000 UNITS EX SOLR
CUTANEOUS | Status: DC | PRN
  Administered 2019-05-22 (×2): 5000 [IU] via TOPICAL

## 2019-05-22 MED ORDER — INSULIN PUMP
Freq: Three times a day (TID) | SUBCUTANEOUS | Status: DC
  Filled 2019-05-22: qty 1

## 2019-05-22 MED ORDER — 0.9 % SODIUM CHLORIDE (POUR BTL) OPTIME
TOPICAL | Status: DC | PRN
  Administered 2019-05-22: 1000 mL

## 2019-05-22 MED ORDER — SENNA 8.6 MG PO TABS
1.0000 | ORAL_TABLET | Freq: Two times a day (BID) | ORAL | Status: DC
  Administered 2019-05-22 – 2019-05-23 (×2): 8.6 mg via ORAL
  Filled 2019-05-22 (×2): qty 1

## 2019-05-22 MED ORDER — DULOXETINE HCL 30 MG PO CPEP
60.0000 mg | ORAL_CAPSULE | Freq: Every day | ORAL | Status: DC
  Administered 2019-05-22 – 2019-05-23 (×2): 60 mg via ORAL
  Filled 2019-05-22 (×2): qty 2

## 2019-05-22 MED ORDER — SODIUM CHLORIDE 0.9% FLUSH
3.0000 mL | Freq: Two times a day (BID) | INTRAVENOUS | Status: DC
  Administered 2019-05-22: 21:00:00 3 mL via INTRAVENOUS

## 2019-05-22 MED ORDER — FENTANYL CITRATE (PF) 100 MCG/2ML IJ SOLN
INTRAMUSCULAR | Status: AC
  Filled 2019-05-22: qty 2

## 2019-05-22 MED ORDER — INSULIN LISPRO 100 UNIT/ML ~~LOC~~ SOLN: 120.0000 [IU] | SUBCUTANEOUS | Status: DC

## 2019-05-22 MED ORDER — DOCUSATE SODIUM 100 MG PO CAPS
100.0000 mg | ORAL_CAPSULE | Freq: Two times a day (BID) | ORAL | Status: DC
  Administered 2019-05-22 – 2019-05-23 (×2): 100 mg via ORAL
  Filled 2019-05-22 (×2): qty 1

## 2019-05-22 MED ORDER — METHYLPREDNISOLONE ACETATE 80 MG/ML IJ SUSP
INTRAMUSCULAR | Status: DC | PRN
  Administered 2019-05-22: 80 mg

## 2019-05-22 MED ORDER — SENNOSIDES-DOCUSATE SODIUM 8.6-50 MG PO TABS: 1.0000 | ORAL_TABLET | Freq: Every evening | ORAL | Status: DC | PRN

## 2019-05-22 MED ORDER — HEMOSTATIC AGENTS (NO CHARGE) OPTIME
TOPICAL | Status: DC | PRN
  Administered 2019-05-22: 1

## 2019-05-22 MED ORDER — BACLOFEN 10 MG PO TABS
20.0000 mg | ORAL_TABLET | Freq: Three times a day (TID) | ORAL | Status: DC
  Administered 2019-05-22 – 2019-05-23 (×3): 20 mg via ORAL
  Filled 2019-05-22 (×3): qty 2

## 2019-05-22 MED ORDER — THROMBIN 5000 UNITS EX SOLR
OROMUCOSAL | Status: DC | PRN
  Administered 2019-05-22: 16:00:00 5 mL

## 2019-05-22 MED ORDER — LIDOCAINE 2% (20 MG/ML) 5 ML SYRINGE
INTRAMUSCULAR | Status: DC | PRN
  Administered 2019-05-22: 100 mg via INTRAVENOUS

## 2019-05-22 MED ORDER — ONDANSETRON HCL 4 MG/2ML IJ SOLN
INTRAMUSCULAR | Status: DC | PRN
  Administered 2019-05-22: 4 mg via INTRAVENOUS

## 2019-05-22 MED ORDER — METHOCARBAMOL 500 MG PO TABS: 500.0000 mg | ORAL_TABLET | Freq: Four times a day (QID) | ORAL | Status: DC | PRN

## 2019-05-22 MED ORDER — SODIUM CHLORIDE 0.9 % IV SOLN: INTRAVENOUS | Status: DC

## 2019-05-22 MED ORDER — ACETAMINOPHEN 500 MG PO TABS
1000.0000 mg | ORAL_TABLET | Freq: Four times a day (QID) | ORAL | Status: DC
  Administered 2019-05-22 – 2019-05-23 (×2): 1000 mg via ORAL
  Filled 2019-05-22 (×3): qty 2

## 2019-05-22 MED ORDER — FLEET ENEMA 7-19 GM/118ML RE ENEM: 1.0000 | ENEMA | Freq: Once | RECTAL | Status: DC | PRN

## 2019-05-22 MED ORDER — HYDROMORPHONE HCL 1 MG/ML IJ SOLN
0.5000 mg | INTRAMUSCULAR | Status: DC | PRN
  Administered 2019-05-22: 19:00:00 1 mg via INTRAVENOUS
  Filled 2019-05-22: qty 1

## 2019-05-22 MED ORDER — HYDROCODONE-ACETAMINOPHEN 5-325 MG PO TABS: 1.0000 | ORAL_TABLET | ORAL | Status: DC | PRN

## 2019-05-22 MED ORDER — CEFAZOLIN SODIUM-DEXTROSE 2-3 GM-%(50ML) IV SOLR
INTRAVENOUS | Status: DC | PRN
  Administered 2019-05-22: 2 g via INTRAVENOUS

## 2019-05-22 MED ORDER — LISINOPRIL 10 MG PO TABS
10.0000 mg | ORAL_TABLET | Freq: Every day | ORAL | Status: DC
  Administered 2019-05-23: 10 mg via ORAL
  Filled 2019-05-22: qty 1

## 2019-05-22 MED ORDER — CEFAZOLIN SODIUM-DEXTROSE 2-4 GM/100ML-% IV SOLN
2.0000 g | Freq: Three times a day (TID) | INTRAVENOUS | Status: AC
  Administered 2019-05-22 – 2019-05-23 (×2): 2 g via INTRAVENOUS
  Filled 2019-05-22 (×2): qty 100

## 2019-05-22 MED ORDER — BUPIVACAINE HCL (PF) 0.5 % IJ SOLN
INTRAMUSCULAR | Status: AC
  Filled 2019-05-22: qty 30

## 2019-05-22 MED ORDER — CEFAZOLIN SODIUM-DEXTROSE 2-4 GM/100ML-% IV SOLN
INTRAVENOUS | Status: AC
  Filled 2019-05-22: qty 100

## 2019-05-22 MED ORDER — FINASTERIDE 5 MG PO TABS
5.0000 mg | ORAL_TABLET | Freq: Every day | ORAL | Status: DC
  Administered 2019-05-22 – 2019-05-23 (×2): 5 mg via ORAL
  Filled 2019-05-22 (×2): qty 1

## 2019-05-22 MED ORDER — FENTANYL CITRATE (PF) 100 MCG/2ML IJ SOLN
25.0000 ug | INTRAMUSCULAR | Status: DC | PRN
  Administered 2019-05-22: 25 ug via INTRAVENOUS

## 2019-05-22 MED ORDER — SUGAMMADEX SODIUM 200 MG/2ML IV SOLN
INTRAVENOUS | Status: DC | PRN
  Administered 2019-05-22: 400 mg via INTRAVENOUS

## 2019-05-22 MED ORDER — METHOCARBAMOL 1000 MG/10ML IJ SOLN
500.0000 mg | Freq: Four times a day (QID) | INTRAVENOUS | Status: DC | PRN
  Filled 2019-05-22: qty 5

## 2019-05-22 MED ORDER — SODIUM CHLORIDE 0.9 % IV SOLN
INTRAVENOUS | Status: DC | PRN
  Administered 2019-05-22: 16:00:00 500 mL

## 2019-05-22 MED ORDER — PHENOL 1.4 % MT LIQD: 1.0000 | OROMUCOSAL | Status: DC | PRN

## 2019-05-22 MED ORDER — LIDOCAINE-EPINEPHRINE 1 %-1:100000 IJ SOLN
INTRAMUSCULAR | Status: AC
  Filled 2019-05-22: qty 1

## 2019-05-22 MED ORDER — AMITRIPTYLINE HCL 25 MG PO TABS
75.0000 mg | ORAL_TABLET | Freq: Every day | ORAL | Status: DC
  Filled 2019-05-22: qty 3

## 2019-05-22 MED ORDER — BISACODYL 10 MG RE SUPP: 10.0000 mg | Freq: Every day | RECTAL | Status: DC | PRN

## 2019-05-22 MED ORDER — THROMBIN 5000 UNITS EX SOLR
CUTANEOUS | Status: AC
  Filled 2019-05-22: qty 15000

## 2019-05-22 MED ORDER — LACTATED RINGERS IV SOLN: INTRAVENOUS | Status: DC | PRN

## 2019-05-22 MED ORDER — MENTHOL 3 MG MT LOZG: 1.0000 | LOZENGE | OROMUCOSAL | Status: DC | PRN

## 2019-05-22 MED ORDER — PROPOFOL 10 MG/ML IV BOLUS
INTRAVENOUS | Status: AC
  Filled 2019-05-22: qty 20

## 2019-05-22 MED ORDER — FENTANYL CITRATE (PF) 250 MCG/5ML IJ SOLN
INTRAMUSCULAR | Status: DC | PRN
  Administered 2019-05-22 (×2): 50 ug via INTRAVENOUS
  Administered 2019-05-22: 100 ug via INTRAVENOUS

## 2019-05-22 MED ORDER — OXYCODONE HCL 5 MG PO TABS
5.0000 mg | ORAL_TABLET | ORAL | Status: DC | PRN
  Administered 2019-05-22: 5 mg via ORAL
  Filled 2019-05-22: qty 1

## 2019-05-22 MED ORDER — ACETAMINOPHEN 325 MG PO TABS: 650.0000 mg | ORAL_TABLET | ORAL | Status: DC | PRN

## 2019-05-22 MED ORDER — PARADIGM PUMP RESERVOIR 3ML MISC: 1.0000 | Status: DC

## 2019-05-22 MED ORDER — BUPIVACAINE HCL (PF) 0.5 % IJ SOLN
INTRAMUSCULAR | Status: DC | PRN
  Administered 2019-05-22: 5 mL

## 2019-05-22 MED ORDER — PROMETHAZINE HCL 25 MG/ML IJ SOLN: 6.2500 mg | INTRAMUSCULAR | Status: DC | PRN

## 2019-05-22 SURGICAL SUPPLY — 55 items
BAG DECANTER FOR FLEXI CONT (MISCELLANEOUS) ×2 IMPLANT
BAND RUBBER #18 3X1/16 STRL (MISCELLANEOUS) ×4 IMPLANT
BENZOIN TINCTURE PRP APPL 2/3 (GAUZE/BANDAGES/DRESSINGS) IMPLANT
BLADE CLIPPER SURG (BLADE) IMPLANT
BLADE SURG 11 STRL SS (BLADE) ×2 IMPLANT
BUR MATCHSTICK NEURO 3.0 LAGG (BURR) IMPLANT
BUR PRECISION FLUTE 5.0 (BURR) IMPLANT
CANISTER SUCT 3000ML PPV (MISCELLANEOUS) ×2 IMPLANT
CARTRIDGE OIL MAESTRO DRILL (MISCELLANEOUS) ×1 IMPLANT
COVER WAND RF STERILE (DRAPES) ×2 IMPLANT
DECANTER SPIKE VIAL GLASS SM (MISCELLANEOUS) ×2 IMPLANT
DERMABOND ADVANCED (GAUZE/BANDAGES/DRESSINGS) ×1
DERMABOND ADVANCED .7 DNX12 (GAUZE/BANDAGES/DRESSINGS) ×1 IMPLANT
DIFFUSER DRILL AIR PNEUMATIC (MISCELLANEOUS) ×2 IMPLANT
DRAPE LAPAROTOMY 100X72X124 (DRAPES) ×2 IMPLANT
DRAPE MICROSCOPE LEICA (MISCELLANEOUS) ×2 IMPLANT
DRAPE SURG 17X23 STRL (DRAPES) ×2 IMPLANT
DRSG OPSITE POSTOP 3X4 (GAUZE/BANDAGES/DRESSINGS) ×2 IMPLANT
DRSG OPSITE POSTOP 4X6 (GAUZE/BANDAGES/DRESSINGS) ×2 IMPLANT
DURAPREP 26ML APPLICATOR (WOUND CARE) ×2 IMPLANT
ELECT REM PT RETURN 9FT ADLT (ELECTROSURGICAL) ×2
ELECTRODE REM PT RTRN 9FT ADLT (ELECTROSURGICAL) ×1 IMPLANT
GAUZE 4X4 16PLY RFD (DISPOSABLE) IMPLANT
GAUZE SPONGE 4X4 12PLY STRL (GAUZE/BANDAGES/DRESSINGS) IMPLANT
GLOVE BIO SURGEON STRL SZ7.5 (GLOVE) IMPLANT
GLOVE BIOGEL PI IND STRL 7.5 (GLOVE) ×2 IMPLANT
GLOVE BIOGEL PI INDICATOR 7.5 (GLOVE) ×2
GLOVE ECLIPSE 7.0 STRL STRAW (GLOVE) ×2 IMPLANT
GLOVE EXAM NITRILE XL STR (GLOVE) IMPLANT
GOWN STRL REUS W/ TWL LRG LVL3 (GOWN DISPOSABLE) ×2 IMPLANT
GOWN STRL REUS W/ TWL XL LVL3 (GOWN DISPOSABLE) IMPLANT
GOWN STRL REUS W/TWL 2XL LVL3 (GOWN DISPOSABLE) IMPLANT
GOWN STRL REUS W/TWL LRG LVL3 (GOWN DISPOSABLE) ×4
GOWN STRL REUS W/TWL XL LVL3 (GOWN DISPOSABLE)
HEMOSTAT POWDER KIT SURGIFOAM (HEMOSTASIS) ×2 IMPLANT
KIT BASIN OR (CUSTOM PROCEDURE TRAY) ×2 IMPLANT
KIT TURNOVER KIT B (KITS) ×2 IMPLANT
NEEDLE HYPO 18GX1.5 BLUNT FILL (NEEDLE) IMPLANT
NEEDLE HYPO 22GX1.5 SAFETY (NEEDLE) ×2 IMPLANT
NEEDLE SPNL 18GX3.5 QUINCKE PK (NEEDLE) IMPLANT
NS IRRIG 1000ML POUR BTL (IV SOLUTION) ×2 IMPLANT
OIL CARTRIDGE MAESTRO DRILL (MISCELLANEOUS) ×2
PACK LAMINECTOMY NEURO (CUSTOM PROCEDURE TRAY) ×2 IMPLANT
PAD ARMBOARD 7.5X6 YLW CONV (MISCELLANEOUS) ×6 IMPLANT
SPONGE LAP 4X18 RFD (DISPOSABLE) IMPLANT
SPONGE SURGIFOAM ABS GEL SZ50 (HEMOSTASIS) ×2 IMPLANT
STRIP CLOSURE SKIN 1/2X4 (GAUZE/BANDAGES/DRESSINGS) IMPLANT
SUT VIC AB 0 CT1 18XCR BRD8 (SUTURE) ×1 IMPLANT
SUT VIC AB 0 CT1 8-18 (SUTURE) ×2
SUT VIC AB 2-0 CT1 18 (SUTURE) IMPLANT
SUT VICRYL 3-0 RB1 18 ABS (SUTURE) ×4 IMPLANT
SYR 3ML LL SCALE MARK (SYRINGE) IMPLANT
TOWEL GREEN STERILE (TOWEL DISPOSABLE) ×2 IMPLANT
TOWEL GREEN STERILE FF (TOWEL DISPOSABLE) ×2 IMPLANT
WATER STERILE IRR 1000ML POUR (IV SOLUTION) ×2 IMPLANT

## 2019-05-22 NOTE — Anesthesia Postprocedure Evaluation (Signed)
Anesthesia Post Note  Patient: Kenneth Pace  Procedure(s) Performed: LAMINOTOMY AND MICRODISCECTOMY RIGHT LUMBAR FOUR- LUMBAR FIVE (Right Spine Lumbar)     Patient location during evaluation: PACU Anesthesia Type: General Level of consciousness: sedated Pain management: pain level controlled Vital Signs Assessment: post-procedure vital signs reviewed and stable Respiratory status: spontaneous breathing and respiratory function stable Cardiovascular status: stable Postop Assessment: no apparent nausea or vomiting Anesthetic complications: no    Last Vitals:  Vitals:   05/22/19 1724 05/22/19 1752  BP: 125/67 (!) 144/82  Pulse: 77 84  Resp: 13 20  Temp: 36.4 C 36.7 C  SpO2: 94% 95%    Last Pain:  Vitals:   05/22/19 1752  TempSrc: Oral  PainSc:                  Alvey Brockel DANIEL

## 2019-05-22 NOTE — Transfer of Care (Signed)
Immediate Anesthesia Transfer of Care Note  Patient: Kenneth Pace  Procedure(s) Performed: LAMINOTOMY AND MICRODISCECTOMY RIGHT LUMBAR FOUR- LUMBAR FIVE (Right Spine Lumbar)  Patient Location: PACU  Anesthesia Type:General  Level of Consciousness: awake, alert  and oriented  Airway & Oxygen Therapy: Patient Spontanous Breathing  Post-op Assessment: Report given to RN and Post -op Vital signs reviewed and stable  Post vital signs: Reviewed and stable  Last Vitals:  Vitals Value Taken Time  BP 137/56 05/22/19 1654  Temp    Pulse 78 05/22/19 1658  Resp 19 05/22/19 1658  SpO2 95 % 05/22/19 1658  Vitals shown include unvalidated device data.  Last Pain:  Vitals:   05/22/19 1346  TempSrc:   PainSc: 5          Complications: No apparent anesthesia complications

## 2019-05-22 NOTE — Anesthesia Procedure Notes (Signed)
Procedure Name: Intubation Date/Time: 05/22/2019 3:20 PM Performed by: Darletta Moll, CRNA Pre-anesthesia Checklist: Patient identified, Emergency Drugs available, Suction available and Patient being monitored Patient Re-evaluated:Patient Re-evaluated prior to induction Oxygen Delivery Method: Circle system utilized Preoxygenation: Pre-oxygenation with 100% oxygen Induction Type: IV induction Ventilation: Two handed mask ventilation required and Oral airway inserted - appropriate to patient size Laryngoscope Size: Mac and 4 Grade View: Grade I Tube type: Oral Tube size: 7.5 mm Number of attempts: 1 Airway Equipment and Method: Stylet and Oral airway Placement Confirmation: ETT inserted through vocal cords under direct vision,  positive ETCO2 and breath sounds checked- equal and bilateral Secured at: 23 cm Tube secured with: Tape Dental Injury: Teeth and Oropharynx as per pre-operative assessment

## 2019-05-22 NOTE — Progress Notes (Addendum)
Inpatient Diabetes Program Recommendations  AACE/ADA: New Consensus Statement on Inpatient Glycemic Control (2015)  Target Ranges:  Prepandial:   less than 140 mg/dL      Peak postprandial:   less than 180 mg/dL (1-2 hours)      Critically ill patients:  140 - 180 mg/dL   Results for AZEL, BENEDETTI" (MRN [id number]) as of 05/22/2019 14:39  Ref. Range 05/22/2019 13:02  Glucose-Capillary Latest Ref Range: 70 - 99 mg/dL 211 (H)    Admit for R L4-5 microdiskectomy  History: Diabetes (records state Type 1)  Home DM Meds: Insulin Pump  Current Orders: None yet     Contacted Pre-Op area and spoke with Heath Lark, Therapist, sports.  Per RN, pt wearing/using insulin pump currently and will be allowed to continue the pump in surgery.  MD- Please remember to Enter orders for the Insulin Pump after surgery today. Found under Order sets: Insulin Pump Therapy  If patient A&O after surgery and able to independently use insulin pump, then hospital policy allows pt to use insulin pump.  If for some reason, pt becomes unable to independently manage his insulin pump after surgery, will need orders for Lantus and Novolog (see below for pump settings)     Endocrinologist: Dr. Loanne Drilling Dr. Kelton Pillar with [facility] Received telephonic advice from Dr. Kelton Pillar on 05/01/2019--Pump settings were as follows: Basal Rates: 12am- 2.6 units/hr 6am- 4.4 units/hr 10:30am- 2.6 units/hr Total Basal per 24 hour period= 70.5 units Take 5 units with each meal (advice from Dr. Loanne Drilling from office visit 11/02/2018) Instructed to change Sensitivity Factor to 30 (from 50) while getting Prednisone by Dr. Kelton Pillar on 05/01/2019 Goal CBG 100 mg/dl    --Will follow patient during hospitalization--  Wyn Quaker RN, MSN, CDE Diabetes Coordinator Inpatient Glycemic Control Team Team Pager: [phone] (8a-5p)

## 2019-05-22 NOTE — Telephone Encounter (Signed)
Per Dr. Loanne Drilling, unable to complete CMN for [employer]tr[employer]; replacement pump and supplies, without an appt. LVM requesting returned call. Routing this message to the front desk for follow up and scheduling purposes per protocol.

## 2019-05-22 NOTE — Op Note (Signed)
  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 Right L4 laminotomy and microdiscectomy for decompression of nerve root 2. Use of operating microscope  SURGEON: Dr. Consuella Lose, MD  ASSISTANT: Ferne Reus, PA-C  ANESTHESIA: General Endotracheal  EBL: 100cc  SPECIMENS: None  DRAINS: None  COMPLICATIONS: None immediate  CONDITION: Hemodynamically stable to PACU  HISTORY: Kenneth Pace is a 69 y.o. male known to me from previous ACDF.  He presented to the outpatient clinic with relatively severe right-sided back and leg pain.  Work-up included MRI scan which demonstrated a right eccentric superiorly migrated L4-5 disc herniation with compression of the L4 nerve root.  He attempted epidural steroid injection which provide transient relief for about a day or so.  He therefore elected to proceed with surgical decompression.  The risks, benefits, and alternatives to surgery as well as the expected recovery and the likelihood of achieving achieving goals of surgery were all discussed in detail with the patient and his wife.  After all her questions were answered informed consent was obtained and witnessed.  PROCEDURE IN DETAIL: After informed consent was obtained and witnessed, the patient was brought to the operating room. After induction of general anesthesia, the patient was positioned on the operative table in the prone position with all pressure points meticulously padded. The skin of the low back was then prepped and draped in the usual sterile fashion.  After timeout was conducted, the skin was infiltrated with local anesthetic. Spinal needle was then introduced and intraoperative lateral portable x-ray was taken to identify the L4-5 interspace. Skin incision was then made sharply and Bovie electrocautery was used to dissect the subcutaneous tissue until the lumbodorsal fascia was identified.  The fascia was then incised using Bovie electrocautery and the lamina at the L4 level was identified and dissection was carried out in the subperiosteal plane. Self-retaining retractor was then placed, and intraoperative x-ray was taken with a dissector just inferior to the L4 lamina which confirmed our location.    Using a high-speed drill, laminotomy was completed with a partial medial facetectomy. The ligamentum flavum was then identified and removed and the lateral edge of the thecal sac was identified.  The ventral epidural space was then dissected with a ball-tipped dissector.  I carried the laminotomy further superior using high-speed drill and Kerrison punches.  Further dissection of the ventral epidural space identified the disc herniation which appeared to be beneath the posterior longitudinal ligament.  The PLL and annulus was incised sharply, and using a combination of ball-tipped dissectors, multiple fragments were removed from just above the disc space corresponding with the preoperative MRI scan.  Once this was done, I was able to freely pass a ball-tipped dissector within the ventral epidural space and in the right L4-5 foramen indicating good decompression.  Hemostasis was then secured using a combination of morcellized Gelfoam and thrombin and bipolar electrocautery. The wound is irrigated with copious amounts of antibiotic saline irrigation. The nerve root was then covered with a long-acting steroid solution. Self-retaining retractor was then removed, and the wound is closed in layers using a combination of interrupted 0 Vicryl and 3-0 Vicryl stitches. The skin was closed using standard skin glue.  At the end of the case all sponge, needle, and instrument counts were correct. The patient was then transferred to the stretcher and taken to the postanesthesia care unit in stable hemodynamic condition.

## 2019-05-22 NOTE — H&P (Signed)
Chief Complaint   Back pain  HPI   HPI: Kenneth Pace is a 69 y.o. maleWith several week history of lower back and right leg pain. Pain primarily affects the right side of his lower back and radiates down his right buttock, thigh and down towards the knee.  He has fallen multiple times due to his right leg giving out.  He has not noted any left leg symptoms or changes in bowel or bladder function.  An MRI of his lumbar spine was ordered and revealed a large disc herniation at L4-5.    Given debilitating nature of the pain interferes with ADLs, who has recommended he undergo surgical intervention. He presents today for right L4-5 microdiskectomy.  He is without any concerns.    Patient Active Problem List   Diagnosis Date Noted  . Spastic gait 03/07/2019  . Myalgia 11/01/2018  . Chronic bilateral low back pain without sciatica 07/21/2018  . 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 Edema 03/01/2017  . Hypokalemia   . Slow transit constipation   . Steroid-induced hyperglycemia   . Urinary retention   . Accidental drug overdose   . Postoperative pain   . Hyponatremia   . Cervical myelopathy ([place]) 02/01/2017  . Shortness of breath at rest   . Hypoglycemia   . Spondylosis, cervical, with myelopathy   . Neuropathic pain   . Benign essential HTN   . Labile blood glucose   . Muscle spasm   . GAD (generalized anxiety disorder)   . Acute inflammatory demyelinating polyneuropathy ([place]) 01/11/2017  . Anxiety state   . Neurogenic bladder   . Depression 12/30/2016  . Leg weakness, bilateral 12/30/2016  . 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 GBS (Guillain Barre syndrome) ([place])   . AIDP (acute inflammatory demyelinating polyneuropathy) ([place]) 11/27/2016  . Weakness 11/20/2016  . Weakness of both arms 10/30/2016  . 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 Obstructive sleep apnea 03/24/2016  . Numbness 03/18/2016  . Mild nonproliferative diabetic retinopathy of both eyes without macular edema associated with type 2 diabetes mellitus ([place]) 05/24/2015  . Nuclear cataract of both eyes 05/24/2015  . Diabetes mellitus without complication ([place]) [id number]  . 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 Eustachian tube dysfunction 02/12/2015  . Acute maxillary sinusitis 02/12/2015  . Wellness examination 08/22/2014  . Alkaline phosphatase elevation 08/22/2014  . 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 Benign non-nodular prostatic hyperplasia with lower urinary tract symptoms 01/31/2014  . Lesion of eyelid 09/26/2013  . Screening for prostate cancer 04/24/2013  . Diabetes ([place]) 06/16/2012  . ED (erectile dysfunction) of organic origin 06/03/2012  . Routine general medical examination at a health care facility 04/10/2012  . BPH (benign prostatic hyperplasia) 02/03/2010  . HEMOCCULT POSITIVE STOOL 05/25/2008  . ELBOW PAIN, RIGHT 07/04/2007  . Dyslipidemia 02/16/2007  . ANXIETY 02/16/2007  . ERECTILE DYSFUNCTION 02/16/2007  . Essential hypertension 02/16/2007  . TESTICULAR MASS, LEFT 02/16/2007  . KNEE PAIN, CHRONIC 02/16/2007  . ADENOIDECTOMY, HX OF 02/16/2007    PMH: Past Medical History:  Diagnosis Date  . ADENOIDECTOMY, HX OF 02/16/2007  . Anxiety    pt. states he does not have anxiety.  Marland Kitchen BENIGN PROSTATIC HYPERTROPHY, WITH OBSTRUCTION 02/03/2010  . 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 DIABETES MELLITUS, TYPE I, UNCONTROLLED 12/29/2006  . ERECTILE DYSFUNCTION 02/16/2007  . HYPERLIPIDEMIA 02/16/2007  .  HYPERTENSION 02/16/2007  . Nerve pain    per patient "on lower back and both legs"  . Sleep apnea    uses CPAP  . TESTICULAR MASS, LEFT 02/16/2007    PSH: Past Surgical History:  Procedure Laterality Date  . ANTERIOR CERVICAL DECOMP/DISCECTOMY FUSION N/A 02/01/2017    Procedure: ANTERIOR CERVICAL DECOMPRESSION/DISCECTOMY FUSION CERVICAL THREE-FOUR , CERVICAL FOUR-FIVE  CERVICAL FIVE-SIX;  Surgeon: Consuella Lose, MD;  Location: [facility];  Service: Neurosurgery;  Laterality: N/A;  . APPENDECTOMY    . COLONOSCOPY  02/05/2010   [facility]  . KNEE SURGERY     2 Lknee arthroscopy, 3 R knee arthroscpoy  . KNEE SURGERY Right 11/12/2016  . TONSILLECTOMY      No medications prior to admission.    SH: Social History   Tobacco Use  . Smoking status: Never Smoker  . Smokeless tobacco: Never Used  Substance Use Topics  . Alcohol use: No  . Drug use: No    MEDS: Prior to Admission medications   Medication Sig Start Date End Date Taking? Authorizing Provider  amitriptyline (ELAVIL) 75 MG tablet TAKE 1 TABLET(75 MG) BY MOUTH AT BEDTIME Patient taking differently: Take 75 mg by mouth at bedtime.  03/28/19  Yes Jamse Arn, MD  B Complex Vitamins (B-COMPLEX/B-12 PO) Take 1 tablet by mouth daily.    Yes [provider]  baclofen (LIORESAL) 10 MG tablet TAKE 2 TABLETS THREE TIMES A DAY Patient taking differently: Take 20 mg by mouth 3 (three) times daily.  04/10/19  Yes Patel, Donika K, DO  Cranberry 400 MG TABS Take 400 mg by mouth daily.    Yes [provider]  Cyanocobalamin (VITAMIN B-12 CR PO) Take 1 tablet by mouth daily.    Yes [provider]  DULoxetine (CYMBALTA) 60 MG capsule TAKE 1 CAPSULE DAILY Patient taking differently: Take 60 mg by mouth daily.  04/04/19  Yes Jamse Arn, MD  finasteride (PROSCAR) 5 MG tablet Take 5 mg by mouth daily.    Yes [provider]  glucagon 1 MG injection Inject 1 mg into the vein once as needed. 07/25/13  Yes Renato Shin, MD  HYDROcodone-acetaminophen (NORCO/VICODIN) 5-325 MG tablet Take 1 tablet by mouth every 6 (six) hours as needed for moderate pain. 05/07/19  Yes Fredia Sorrow, MD  insulin lispro (HUMALOG) 100 UNIT/ML injection INJECT 120 UNITS DAILY IN PUMP Patient  taking differently: Inject 120 Units into the skin continuous. Via Pump 08/31/18  Yes Renato Shin, MD  lidocaine (LIDODERM) 5 % At 7 am and remove at 7 pm. Apply 1 on each side. Patient taking differently: Place 2 patches onto the skin daily as needed (pain).  04/25/19  Yes Patel, Domenick Bookbinder, MD  lisinopril-hydrochlorothiazide (ZESTORETIC) 10-12.5 MG tablet TAKE 1 TABLET DAILY, PLEASE SEE PRIMARY CARE PHYSICIAN FOR ANY FUTURE REFILLS Patient taking differently: Take 1 tablet by mouth daily.  08/26/18  Yes Shelda Pal, DO  Multiple Vitamin (MULTIVITAMIN) capsule Take 1 capsule by mouth daily.    Yes [provider]  scopolamine (TRANSDERM-SCOP) 1 MG/3DAYS Place 1 patch (1.5 mg total) onto the skin every 3 (three) days. Patient taking differently: Place 1 patch onto the skin daily as needed (dizziness).  03/24/19  Yes Shelda Pal, DO  tiZANidine (ZANAFLEX) 2 MG tablet Take 1 tablet (2 mg total) by mouth every 8 (eight) hours as needed for muscle spasms. 05/01/19  Yes Meredith Staggers, MD  celecoxib (CELEBREX) 100 MG capsule TAKE 1 CAPSULE(100  MG) BY MOUTH AT BEDTIME Patient not taking: Reported on 05/16/2019 03/07/19   Jamse Arn, MD  clotrimazole-betamethasone (LOTRISONE) cream Apply 1 application topically 2 (two) times daily. Patient not taking: Reported on 05/16/2019 11/02/18   Renato Shin, MD  glucose blood (BAYER CONTOUR NEXT TEST) test strip MEDICALLY NECESSARY FOR USE WITH PUMP; Use to check blood sugar 5 times per day and prn; E11.42 02/03/18   Renato Shin, MD  Insulin Infusion Pump Supplies (PARADIGM RESERVOIR 3ML) MISC 1 Device by Does not apply route every 3 (three) days. 08/31/18   Renato Shin, MD  Insulin Infusion Pump Supplies (QUICK-SET INFUSION 43" 9MM) MISC 1 Device by Does not apply route every 3 (three) days. 08/31/18   Renato Shin, MD  predniSONE (DELTASONE) 20 MG tablet Take 1 tablet (20 mg total) by mouth as directed. 1 tab twice daily for 2  days then 1 tab once daily for 2 days then off Patient not taking: Reported on 05/16/2019 05/01/19 04/30/20  Meredith Staggers, MD    ALLERGY: No Known Allergies  Social History   Tobacco Use  . Smoking status: Never Smoker  . Smokeless tobacco: Never Used  Substance Use Topics  . Alcohol use: No     Family History  Problem Relation Age of Onset  . Dementia Mother   . Diabetes Mellitus I Mother   . Hypertension Father        10  . Healthy Sister   . Rheum arthritis Brother   . Cancer Neg Hx   . Colon cancer Neg Hx   . Esophageal cancer Neg Hx   . Stomach cancer Neg Hx   . Rectal cancer Neg Hx   . Colon polyps Neg Hx      ROS   ROS  Exam   There were no vitals filed for this visit. General appearance: WDWN, NAD Eyes: No scleral injection Cardiovascular: Regular rate and rhythm without murmurs, rubs, gallops. No edema or variciosities. Distal pulses normal. Pulmonary: Effort normal, non-labored breathing Musculoskeletal:     Muscle tone upper extremities: Normal    Muscle tone lower extremities: Normal    Motor exam: Upper Extremities Deltoid Bicep Tricep Grip  Right 5/5 5/5 5/5 5/5  Left 5/5 5/5 5/5 5/5   Lower Extremity IP Quad PF DF EHL  Right 5/5 5/5 5/5 5/5 5/5  Left 5/5 5/5 5/5 5/5 5/5   Neurological Mental Status:    - Patient is awake, alert, oriented to person, place, month, year, and situation    - Patient is able to give a clear and coherent history.    - No signs of aphasia or neglect Cranial Nerves    - II: Visual Fields are full. PERRL    - III/IV/VI: EOMI without ptosis or diploplia.     - V: Facial sensation is grossly normal    - VII: Facial movement is symmetric.     - VIII: hearing is intact to voice    - X: Uvula elevates symmetrically    - XI: Shoulder shrug is symmetric.    - XII: tongue is midline without atrophy or fasciculations.  Sensory: Sensation grossly intact to LT Results - Imaging/Labs   No results found for this or  any previous visit (from the past 48 hour(s)).  No results found.  IMAGING: MRI of the lumbar spine dated 05/07/2018 was reviewed.  This demonstrates primary findings at L4-5 where there is a trace grade 1 anterolisthesis, however primary finding is a  large right eccentric superiorly migrated disc herniation with compression of the right L4 nerve root.  Impression/Plan   69 y.o. male  With right-sided back and leg pain consistent with right L4 radiculopathy related tell large right superiorly migrated L4-5 disc herniation.  Pain has required trip to the emergency department and limits his ability to walk due to giveaway weakness.  We will proceed with right-sided L4-5 laminotomy and microdiskectomy.  While in the office risks, benefits and alternatives were discussed.  Patient stated understanding and wished to proceed.  Ferne Reus, PA-C [facility] [employer]

## 2019-05-23 DIAGNOSIS — M5116 Intervertebral disc disorders with radiculopathy, lumbar region: Secondary | ICD-10-CM | POA: Diagnosis not present

## 2019-05-23 DIAGNOSIS — G4730 Sleep apnea, unspecified: Secondary | ICD-10-CM | POA: Diagnosis not present

## 2019-05-23 DIAGNOSIS — N319 Neuromuscular dysfunction of bladder, unspecified: Secondary | ICD-10-CM | POA: Diagnosis not present

## 2019-05-23 DIAGNOSIS — E113293 Type 2 diabetes mellitus with mild nonproliferative diabetic retinopathy without macular edema, bilateral: Secondary | ICD-10-CM | POA: Diagnosis not present

## 2019-05-23 DIAGNOSIS — R2689 Other abnormalities of gait and mobility: Secondary | ICD-10-CM | POA: Diagnosis not present

## 2019-05-23 DIAGNOSIS — E876 Hypokalemia: Secondary | ICD-10-CM | POA: Diagnosis not present

## 2019-05-23 DIAGNOSIS — G6181 Chronic inflammatory demyelinating polyneuritis: Secondary | ICD-10-CM | POA: Diagnosis not present

## 2019-05-23 DIAGNOSIS — M545 Low back pain: Secondary | ICD-10-CM | POA: Diagnosis not present

## 2019-05-23 DIAGNOSIS — M25561 Pain in right knee: Secondary | ICD-10-CM | POA: Diagnosis not present

## 2019-05-23 DIAGNOSIS — E1136 Type 2 diabetes mellitus with diabetic cataract: Secondary | ICD-10-CM | POA: Diagnosis not present

## 2019-05-23 DIAGNOSIS — F411 Generalized anxiety disorder: Secondary | ICD-10-CM | POA: Diagnosis not present

## 2019-05-23 DIAGNOSIS — I10 Essential (primary) hypertension: Secondary | ICD-10-CM | POA: Diagnosis not present

## 2019-05-23 DIAGNOSIS — F329 Major depressive disorder, single episode, unspecified: Secondary | ICD-10-CM | POA: Diagnosis not present

## 2019-05-23 DIAGNOSIS — E871 Hypo-osmolality and hyponatremia: Secondary | ICD-10-CM | POA: Diagnosis not present

## 2019-05-23 DIAGNOSIS — G8929 Other chronic pain: Secondary | ICD-10-CM | POA: Diagnosis not present

## 2019-05-23 DIAGNOSIS — M4712 Other spondylosis with myelopathy, cervical region: Secondary | ICD-10-CM | POA: Diagnosis not present

## 2019-05-23 LAB — BASIC METABOLIC PANEL
Anion gap: 10 (ref 5–15)
BUN: 20 mg/dL (ref 8–23)
CO2: 24 mmol/L (ref 22–32)
Calcium: 9.3 mg/dL (ref 8.9–10.3)
Chloride: 103 mmol/L (ref 98–111)
Creatinine, Ser: 0.95 mg/dL (ref 0.61–1.24)
GFR calc Af Amer: >60 mL/min (ref >60)
GFR calc non Af Amer: >60 mL/min (ref >60)
Glucose, Bld: 123 mg/dL — ABNORMAL HIGH (ref 70–99)
Potassium: 4.4 mmol/L (ref 3.5–5.1)
Sodium: 137 mmol/L (ref 135–145)

## 2019-05-23 LAB — PROTIME-INR
INR: 1.1 (ref 0.8–1.2)
Prothrombin Time: 14.2 seconds (ref 11.4–15.2)

## 2019-05-23 LAB — GLUCOSE, CAPILLARY
Glucose-Capillary: 215 mg/dL — ABNORMAL HIGH (ref 70–99)
Glucose-Capillary: 224 mg/dL — ABNORMAL HIGH (ref 70–99)

## 2019-05-23 LAB — CBC
HCT: 37.5 % — ABNORMAL LOW (ref 39.0–52.0)
Hemoglobin: 13 g/dL (ref 13.0–17.0)
MCH: 31.4 pg (ref 26.0–34.0)
MCHC: 34.7 g/dL (ref 30.0–36.0)
MCV: 90.6 fL (ref 80.0–100.0)
Platelets: 162 10*3/uL (ref 150–400)
RBC: 4.14 MIL/uL — ABNORMAL LOW (ref 4.22–5.81)
RDW: 13 % (ref 11.5–15.5)
WBC: 11.4 10*3/uL — ABNORMAL HIGH (ref 4.0–10.5)
nRBC: 0 % (ref 0.0–0.2)

## 2019-05-23 LAB — APTT: aPTT: 29 seconds (ref 24–36)

## 2019-05-23 IMAGING — RF DG FLUORO GUIDE LUMBAR PUNCTURE
1 series · 1 of 1 positions shown · non-contrast
Comparison: none

CLINICAL DATA: Neuropathy

[Series 1: cp_standard · 0.26mm/px · 1 of 1 slices shown]
[im 1/1]
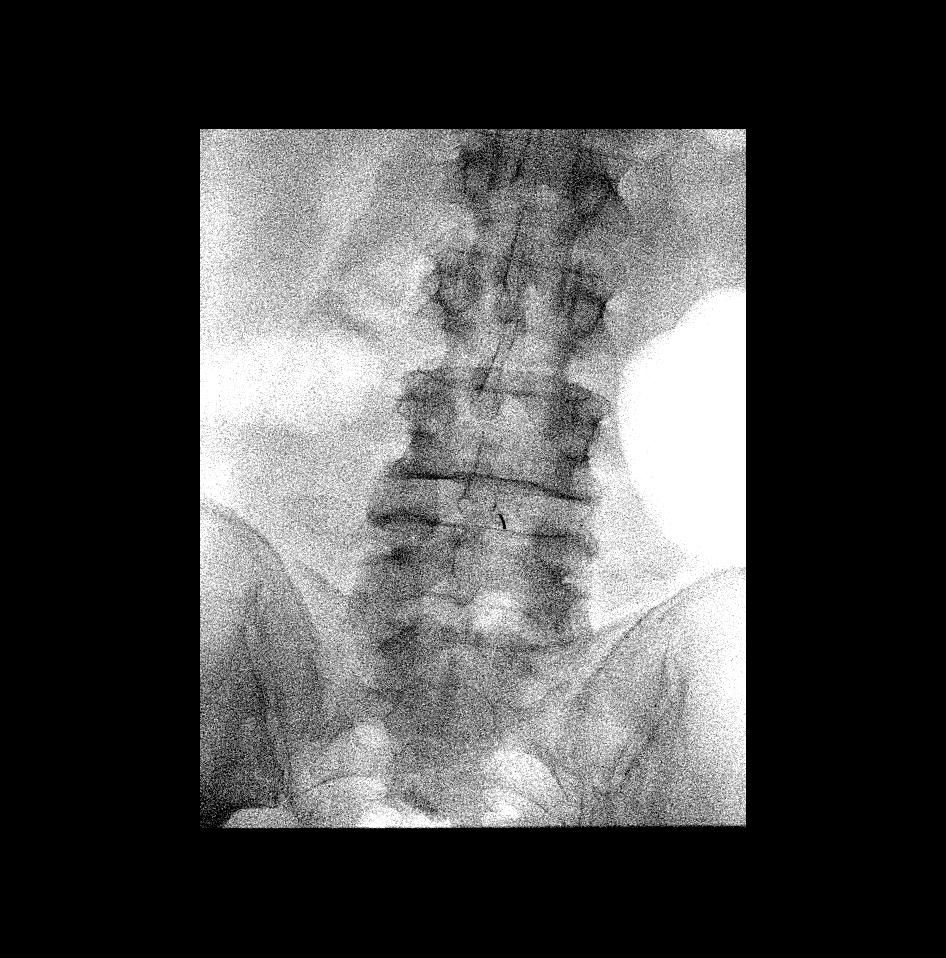

[1 of 1 positions shown; findings below may reference images not displayed]

EXAM:
DIAGNOSTIC LUMBAR PUNCTURE UNDER FLUOROSCOPIC GUIDANCE

FLUOROSCOPY TIME:  Fluoroscopy Time:  0.5 minute

Radiation Exposure Index (if provided by the fluoroscopic device):
3.2 mGy

Number of Acquired Spot Images: 0

PROCEDURE:
Informed consent was obtained from the patient prior to the
procedure, including potential complications of headache, allergy,
and pain. With the patient prone, the lower back was prepped with
Betadine. 1% Lidocaine was used for local anesthesia. Lumbar
puncture was performed at the L4-5 level using a 22 gauge needle
with return of clear CSF. 10 ml of CSF were obtained for laboratory
studies. The patient tolerated the procedure well and there were no
apparent complications.
IMPRESSION: Successful fluoroscopic guided lumbar puncture.

## 2019-05-23 MED ORDER — METHOCARBAMOL 750 MG PO TABS: 750.0000 mg | ORAL_TABLET | Freq: Three times a day (TID) | ORAL | 1 refills | Status: DC | PRN

## 2019-05-23 NOTE — Discharge Summary (Signed)
Physician Discharge Summary  Patient ID: Kenneth Pace MRN: [id number] DOB/AGE: January 25, 1951 69 y.o.  Admit date: 05/22/2019 Discharge date: 05/23/2019  Admission Diagnoses:  Lumbar radiculopathy  Discharge Diagnoses:  Same Active Problems:   Lumbar radiculopathy   Discharged Condition: Stable  Hospital Course:  Kenneth Pace is a 69 y.o. male who was admitted for the below procedure. There were no post operative complications. At time of discharge, pain was well controlled, ambulating with Pt/OT, tolerating po, voiding normal. Ready for discharge.  Treatments: Surgery Right L4 laminotomy and microdiscectomy for decompression of nerve root  Discharge Exam: Blood pressure 126/76, pulse 85, temperature 98.2 F (36.8 C), temperature source Oral, resp. rate 20, height 6' (1.829 m), weight 106.6 kg, SpO2 100 %. Awake, alert, oriented Speech fluent, appropriate CN grossly intact 5/5 BUE/BLE Wound c/d/i  Disposition: Discharge disposition: 01-Home or Self Care       Discharge Instructions    Call MD for:  difficulty breathing, headache or visual disturbances   Complete by: As directed    Call MD for:  persistant dizziness or light-headedness   Complete by: As directed    Call MD for:  redness, tenderness, or signs of infection (pain, swelling, redness, odor or green/yellow discharge around incision site)   Complete by: As directed    Call MD for:  severe uncontrolled pain   Complete by: As directed    Call MD for:  temperature >100.4   Complete by: As directed    Diet general   Complete by: As directed    Driving Restrictions   Complete by: As directed    Do not drive until given clearance.   Increase activity slowly   Complete by: As directed    Lifting restrictions   Complete by: As directed    Do not lift anything >10lbs. Avoid bending and twisting in awkward positions. Avoid bending at the back.   May shower / Bathe   Complete by: As directed    In  24 hours. Okay to wash wound with warm soapy water. Avoid scrubbing the wound. Pat dry.   Remove dressing in 24 hours   Complete by: As directed      Allergies as of 05/23/2019   No Known Allergies     Medication List    TAKE these medications   amitriptyline 75 MG tablet Commonly known as: ELAVIL TAKE 1 TABLET(75 MG) BY MOUTH AT BEDTIME What changed: See the new instructions.   B-COMPLEX/B-12 PO Take 1 tablet by mouth daily.   baclofen 10 MG tablet Commonly known as: LIORESAL TAKE 2 TABLETS THREE TIMES A DAY What changed: See the new instructions.   celecoxib 100 MG capsule Commonly known as: CELEBREX TAKE 1 CAPSULE(100 MG) BY MOUTH AT BEDTIME   clotrimazole-betamethasone cream Commonly known as: LOTRISONE Apply 1 application topically 2 (two) times daily.   Cranberry 400 MG Tabs Take 400 mg by mouth daily.   DULoxetine 60 MG capsule Commonly known as: CYMBALTA TAKE 1 CAPSULE DAILY   finasteride 5 MG tablet Commonly known as: PROSCAR Take 5 mg by mouth daily.   glucagon 1 MG injection Inject 1 mg into the vein once as needed.   glucose blood test strip Commonly known as: Visual merchandiser Next Test MEDICALLY NECESSARY FOR USE WITH PUMP; Use to check blood sugar 5 times per day and prn; E11.42   HYDROcodone-acetaminophen 5-325 MG tablet Commonly known as: NORCO/VICODIN Take 1 tablet by mouth every 6 (six) hours as needed for  moderate pain.   insulin lispro 100 UNIT/ML injection Commonly known as: HumaLOG INJECT 120 UNITS DAILY IN PUMP What changed:   how much to take  how to take this  when to take this  additional instructions   lidocaine 5 % Commonly known as: LIDODERM At 7 am and remove at 7 pm. Apply 1 on each side. What changed:   how much to take  how to take this  when to take this  reasons to take this  additional instructions   lisinopril-hydrochlorothiazide 10-12.5 MG tablet Commonly known as: ZESTORETIC TAKE 1 TABLET DAILY,  PLEASE SEE PRIMARY CARE PHYSICIAN FOR ANY FUTURE REFILLS What changed:   how much to take  how to take this  when to take this  additional instructions   methocarbamol 750 MG tablet Commonly known as: Robaxin-750 Take 1 tablet (750 mg total) by mouth 3 (three) times daily as needed for muscle spasms.   multivitamin capsule Take 1 capsule by mouth daily.   PARADIGM RESERVOIR 3ML Misc 1 Device by Does not apply route every 3 (three) days.   Quick-set Infusion 43" 47mm Misc 1 Device by Does not apply route every 3 (three) days.   predniSONE 20 MG tablet Commonly known as: Deltasone Take 1 tablet (20 mg total) by mouth as directed. 1 tab twice daily for 2 days then 1 tab once daily for 2 days then off   scopolamine 1 MG/3DAYS Commonly known as: TRANSDERM-SCOP Place 1 patch (1.5 mg total) onto the skin every 3 (three) days. What changed:   when to take this  reasons to take this   tiZANidine 2 MG tablet Commonly known as: ZANAFLEX Take 1 tablet (2 mg total) by mouth every 8 (eight) hours as needed for muscle spasms.   VITAMIN B-12 CR PO Take 1 tablet by mouth daily.      Follow-up Information    Consuella Lose, MD. Schedule an appointment as soon as possible for a visit in 3 week(s).   Specialty: Neurosurgery Contact information: [address] [place] [address] [place] [address] [phone]           Signed: Traci Sermon 05/23/2019, 7:57 AM

## 2019-05-23 NOTE — Progress Notes (Signed)
  NEUROSURGERY PROGRESS NOTE   No issues overnight.  Pre op pain resolved Complains of appropriate back soreness Ambulating halls. Tolerating po. Voiding normal Eager for d/c  EXAM:  BP 126/76 (BP Location: Right Arm)   Pulse 85   Temp 98.2 F (36.8 C) (Oral)   Resp 20   Ht 6' (1.829 m)   Wt 106.6 kg   SpO2 100%   BMI 31.87 kg/m   Awake, alert, oriented  Speech fluent, appropriate  CN grossly intact  5/5 BUE/BLE  Incision: c/d/i  IMPRESSION/PLAN 69 y.o. male pod #1. Doing well - d/c home

## 2019-05-23 NOTE — Progress Notes (Signed)
Occupational Therapy Evaluation Patient Details Name: Kenneth Pace MRN: [id number] DOB: September 04, 1950 Today's Date: 05/23/2019    History of Present Illness 69 yo s/p Right L4 laminotomy and microdiscectomy for decompression of nerve root   Clinical Impression   PTA pt independent with ADL and mobility using his cane. Completed education regarding back precautions for ADL and functional mobility. Written information reviewed. Pt verbalized understanding although requires vc to adhere to back precautions. OT signing off.     Follow Up Recommendations  No OT follow up;Supervision - Intermittent    Equipment Recommendations  None recommended by OT    Recommendations for Other Services       Precautions / Restrictions Precautions Precautions: Back Precaution Booklet Issued: Yes (comment)      Mobility Bed Mobility Overal bed mobility: Needs Assistance Bed Mobility: Sidelying to Sit;Sit to Sidelying   Sidelying to sit: Supervision     Sit to sidelying: Supervision General bed mobility comments: Pt trying to use momentum to get out of bed. Pt educated on proper technique of rolling onto his side then pushing up and not using momentum to thrust out of bed. Once seated, again required vc for proper technique.   Transfers Overall transfer level: Needs assistance   Transfers: Sit to/from Stand;Stand Pivot Transfers Sit to Stand: Supervision Stand pivot transfers: Supervision       General transfer comment: unsteady. 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     Balance Overall balance assessment: Mild deficits observed, not formally tested                                         ADL either performed or assessed with clinical judgement   ADL Overall ADL's : Needs assistance/impaired                                     Functional mobility during ADLs: Min guard General ADL Comments: Able to achieve figure four position for LB  ADL. Educated pt on incorporating back precautions for ADL. Recommend pt use his shower seat for bathing adn long handled sponge. States he is Able to reach peri area wihtout bending however educated on use of AE for hygiene after toileting if needed. Educated on grooming @ sink level. Educated on strategies to reduce risk of falls. PT verbalized understanding.      Vision Baseline Vision/History: Wears glasses       Perception     Praxis      Pertinent Vitals/Pain Pain Assessment: Faces Faces Pain Scale: Hurts little more Pain Location: RLE Pain Descriptors / Indicators: Aching;Discomfort Pain Intervention(s): Limited activity within patient's tolerance;Repositioned(encouraged use of ice)     Hand Dominance Right   Extremity/Trunk Assessment Upper Extremity Assessment Upper Extremity Assessment: Overall WFL for tasks assessed   Lower Extremity Assessment Lower Extremity Assessment: Defer to PT evaluation   Cervical / Trunk Assessment Cervical / Trunk Assessment: Other exceptions(previous ACDF; back surgery)   Communication Communication Communication: No difficulties   Cognition Arousal/Alertness: Awake/alert Behavior During Therapy: WFL for tasks assessed/performed Overall Cognitive Status: Within Functional Limits for tasks assessed                                 General Comments: cues for safety  General Comments       Exercises     Shoulder Instructions      Home Living Family/patient expects to be discharged to:: Private residence Living Arrangements: Spouse/significant other Available Help at Discharge: Family;Available 24 hours/day Type of Home: House       Home Layout: One level     Bathroom Shower/Tub: Tub/shower unit;Curtain   Bathroom Toilet: Standard Bathroom Accessibility: No   Home Equipment: Environmental consultant - 2 wheels;Cane - single point;Bedside commode;Shower seat;Grab bars - toilet;Grab bars - tub/shower;Hand held shower  head;Adaptive equipment Adaptive Equipment: Reacher;Long-handled sponge        Prior Functioning/Environment Level of Independence: Independent with assistive device(s)        Comments: used cane for ambulation        OT Problem List: Decreased strength;Decreased safety awareness;Decreased knowledge of use of DME or AE;Decreased knowledge of precautions      OT Treatment/Interventions: Self-care/ADL training;DME and/or AE instruction;Therapeutic activities;Patient/family education    OT Goals(Current goals can be found in the care plan section) Acute Rehab OT Goals Patient Stated Goal: to go home OT Goal Formulation: With patient  OT Frequency: Min 2X/week   Barriers to D/C:            Co-evaluation              AM-PAC OT "6 Clicks" Daily Activity     Outcome Measure Help from another person eating meals?: None Help from another person taking care of personal grooming?: A Little Help from another person toileting, which includes using toliet, bedpan, or urinal?: A Little Help from another person bathing (including washing, rinsing, drying)?: A Little Help from another person to put on and taking off regular upper body clothing?: A Little Help from another person to put on and taking off regular lower body clothing?: A Little 6 Click Score: 19   End of Session Equipment Utilized During Treatment: (cane) Nurse Communication: Mobility status  Activity Tolerance: Patient tolerated treatment well Patient left: in bed;with call bell/phone within reach  OT Visit Diagnosis: Unsteadiness on feet (R26.81);Muscle weakness (generalized) (M62.81)                Time: [id number] OT Time Calculation (min): 15 min Charges:  OT General Charges $OT Visit: 1 Visit OT Evaluation $OT Eval Low Complexity: [place], OT/L   Acute OT Clinical Specialist [place] Pager [phone] Office [phone]   [facility] 05/23/2019, 9:12 AM

## 2019-05-23 NOTE — Progress Notes (Signed)
Pt doing well. Pt and wife given D/C instructions with verbal understanding. Pt's incision is clean and dry with no sign of infection. Pt's IV was removed prior to D/C. Pt D/C'd home via wheelchair per MD order. Pt is stable @ D/C and has no other needs at this time. Orazio Weller, RN  

## 2019-05-23 NOTE — Evaluation (Signed)
Physical Therapy Evaluation Patient Details Name: Kenneth Pace MRN: [id number] DOB: Feb 05, 1951 Today's Date: 05/23/2019   History of Present Illness  69 yo s/p Right L4 laminotomy and microdiscectomy for decompression of nerve root 05/22/19. 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  Clinical Impression  Patient presents with pain, impaired balance, post surgical deficits and impaired mobility s/p above. PTA, pt reports being Mod I with RW and working. Reports some falls. Has been getting OPPT due to GBS. Today, pt requires close min guard for all mobility with [facility] for support. Encouraged use of RW for better support to prevent falls, pt agreeable. Education re: back precautions, positioning, log roll technique, walking program etc. Will follow acutely to maximize independence and mobility prior to return home.    Follow Up Recommendations No PT follow up;Supervision for mobility/OOB    Equipment Recommendations  None recommended by PT    Recommendations for Other Services       Precautions / Restrictions Precautions Precautions: Back Precaution Booklet Issued: Yes (comment) Precaution Comments: Reviewed precautions and handout Restrictions Weight Bearing Restrictions: No      Mobility  Bed Mobility Overal bed mobility: Needs Assistance Bed Mobility: Sidelying to Sit;Sit to Sidelying   Sidelying to sit: Supervision     Sit to sidelying: Supervision General bed mobility comments: Pt up in hallway upon PT arrival.  Transfers Overall transfer level: Needs assistance Equipment used: Straight cane Transfers: Sit to/from Stand Sit to Stand: Supervision Stand pivot transfers: Supervision       General transfer comment: Supervision for safety; unsteady, likely premorbid from GBS  Ambulation/Gait Ambulation/Gait assistance: Min guard Gait Distance (Feet): 250 Feet Assistive device: Straight cane Gait Pattern/deviations: Step-through pattern;Decreased stride  length;Staggering left;Staggering right;Leaning posteriorly Gait velocity: decreased   General Gait Details: Slow, mildly unsteady gait with mild staggering noted using SPC. 2 standing rest breaks, posterior lean at times. Would likely do better with RW.  Stairs            Wheelchair Mobility    Modified Rankin (Stroke Patients Only)       Balance Overall balance assessment: Needs assistance Sitting-balance support: Feet supported;No upper extremity supported Sitting balance-Leahy Scale: Good     Standing balance support: During functional activity Standing balance-Leahy Scale: Fair Standing balance comment: Requires UE support in standing. Close min guard for dynamic tasks                             Pertinent Vitals/Pain Pain Assessment: Faces Faces Pain Scale: Hurts little more Pain Location: RLE, back Pain Descriptors / Indicators: Aching;Discomfort;Sore Pain Intervention(s): Monitored during session;Repositioned    Home Living Family/patient expects to be discharged to:: Private residence Living Arrangements: Spouse/significant other Available Help at Discharge: Family;Available 24 hours/day Type of Home: House Home Access: Stairs to enter Entrance Stairs-Rails: Left;Right;Can reach both Entrance Stairs-Number of Steps: 2 Home Layout: One level Home Equipment: Walker - 2 wheels;Cane - single point;Bedside commode;Shower seat;Grab bars - toilet;Grab bars - tub/shower;Hand held shower head;Adaptive equipment Additional Comments: Has been getting OPPT for GBS    Prior Function Level of Independence: Independent with assistive device(s)         Comments: Has been using RW for ambulation PTA and doing own ADLs. Recently working from home. Reports falls.     Hand Dominance   Dominant Hand: Right    Extremity/Trunk Assessment   Upper Extremity Assessment Upper Extremity Assessment: (numbness in bil hands  from GBS)    Lower Extremity  Assessment Lower Extremity Assessment: RLE deficits/detail RLE Deficits / Details: Grossly ~4/5 throughout with some post operative muscle soreness in the quadriceps RLE Sensation: WNL    Cervical / Trunk Assessment Cervical / Trunk Assessment: Other exceptions Cervical / Trunk Exceptions: s/p spine surgery  Communication   Communication: No difficulties  Cognition Arousal/Alertness: Awake/alert Behavior During Therapy: WFL for tasks assessed/performed Overall Cognitive Status: Within Functional Limits for tasks assessed                                 General Comments: cues for safety      General Comments General comments (skin integrity, edema, etc.): Incision, clean dry and intact    Exercises     Assessment/Plan    PT Assessment Patient needs continued PT services  PT Problem List Decreased strength;Decreased mobility;Pain;Decreased balance;Decreased skin integrity       PT Treatment Interventions Therapeutic activities;Gait training;Therapeutic exercise;Balance training;Functional mobility training;Patient/family education;Stair training    PT Goals (Current goals can be found in the Care Plan section)  Acute Rehab PT Goals Patient Stated Goal: to go home PT Goal Formulation: With patient Time For Goal Achievement: 06/06/19 Potential to Achieve Goals: Good    Frequency Min 5X/week   Barriers to discharge        Co-evaluation               AM-PAC PT "6 Clicks" Mobility  Outcome Measure Help needed turning from your back to your side while in a flat bed without using bedrails?: None Help needed moving from lying on your back to sitting on the side of a flat bed without using bedrails?: A Little Help needed moving to and from a bed to a chair (including a wheelchair)?: None Help needed standing up from a chair using your arms (e.g., wheelchair or bedside chair)?: None Help needed to walk in hospital room?: A Little Help needed climbing  3-5 steps with a railing? : A Little 6 Click Score: 21    End of Session Equipment Utilized During Treatment: Gait belt Activity Tolerance: Patient tolerated treatment well Patient left: in bed;with call bell/phone within reach(sitting EOB) Nurse Communication: Mobility status PT Visit Diagnosis: Pain;Unsteadiness on feet (R26.81);Difficulty in walking, not elsewhere classified (R26.2) Pain - Right/Left: Right Pain - part of body: Leg    Time: 0742-0806 PT Time Calculation (min) (ACUTE ONLY): 24 min   Charges:   PT Evaluation $PT Eval Moderate Complexity: 1 Mod PT Treatments $Gait Training: 8-22 mins        Marisa Severin, PT, DPT Acute Rehabilitation Services Pager [phone] Office [place] 05/23/2019, 9:51 AM

## 2019-05-24 ENCOUNTER — Other Ambulatory Visit: Payer: Self-pay

## 2019-05-24 NOTE — Telephone Encounter (Signed)
Patient is scheduled for appointment on 05/30/19 at 1:00 p.m.

## 2019-05-25 ENCOUNTER — Telehealth: Payer: Self-pay | Admitting: Neurology

## 2019-05-25 ENCOUNTER — Other Ambulatory Visit: Payer: Self-pay

## 2019-05-25 DIAGNOSIS — G6181 Chronic inflammatory demyelinating polyneuritis: Secondary | ICD-10-CM

## 2019-05-25 NOTE — Telephone Encounter (Signed)
Infusion placed.

## 2019-05-25 NOTE — Telephone Encounter (Signed)
Erasmo Downer from [facility] called and requested new orders for IVIG from Dr. Posey Pronto.

## 2019-05-26 ENCOUNTER — Other Ambulatory Visit: Payer: Self-pay

## 2019-05-26 ENCOUNTER — Ambulatory Visit (HOSPITAL_COMMUNITY)
Admission: RE | Admit: 2019-05-26 | Discharge: 2019-05-26 | Disposition: A | Discharge status: Home / Self Care | Payer: BC Managed Care – PPO | Source: Ambulatory Visit | Attending: Neurology | Admitting: Neurology

## 2019-05-26 DIAGNOSIS — G610 Guillain-Barre syndrome: Secondary | ICD-10-CM | POA: Diagnosis not present

## 2019-05-26 MED ORDER — IMMUNE GLOBULIN (HUMAN) 10 GM/100ML IV SOLN
1.0000 g/kg | Freq: Once | INTRAVENOUS | Status: DC
  Administered 2019-05-26: 105 g via INTRAVENOUS
  Filled 2019-05-26: qty 1100

## 2019-05-26 MED ORDER — PRIVIGEN 10 GM/100ML IV SOLN: 1.0000 g/kg | INTRAVENOUS | 0 refills | Status: AC

## 2019-05-29 ENCOUNTER — Other Ambulatory Visit: Payer: Self-pay

## 2019-05-29 ENCOUNTER — Encounter: Payer: Self-pay | Admitting: Neurology

## 2019-05-29 ENCOUNTER — Ambulatory Visit (INDEPENDENT_AMBULATORY_CARE_PROVIDER_SITE_OTHER): Payer: BC Managed Care – PPO | Admitting: Neurology

## 2019-05-29 VITALS — BP 119/72 | HR 69 | Resp 18 | Ht 72.0 in | Wt 227.0 lb

## 2019-05-29 DIAGNOSIS — G6181 Chronic inflammatory demyelinating polyneuritis: Secondary | ICD-10-CM | POA: Diagnosis not present

## 2019-05-29 DIAGNOSIS — R261 Paralytic gait: Secondary | ICD-10-CM | POA: Diagnosis not present

## 2019-05-29 MED ORDER — BACLOFEN 10 MG PO TABS: 20.0000 mg | ORAL_TABLET | Freq: Three times a day (TID) | ORAL | 3 refills | Status: DC

## 2019-05-29 NOTE — Patient Instructions (Addendum)
Continue IVIG every 6 weeks  Continue baclofen 20mg  three times daily  Return to clinic in October/November 2022

## 2019-05-29 NOTE — Progress Notes (Signed)
Follow-up Visit   Date: 05/29/19   Kenneth Pace MRN: [id number] DOB: 12-31-1950   Interim History: Kenneth Pace is a 69 y.o. 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  In March, he was having severe right leg pain and went to the ER where MRI lumbar spine showed large paracentral L4-5 disc extrusion and impingement of the right L4 nerve root.  He underwent right L4 laminectomy and microdiscectomy on 05/22/2019 by Dr. Kathyrn Sheriff and has significant relief of pain.  He continues to have tingling over the right thigh and knee.  He remains off balance when walking and uses a cane.  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  He gets IVIG every 6 weeks and has minimal tingling over the hands a week prior to his next dose, which quicly improves within 2 days of taking IVIG.  No new complaints. .   Medications:  Current Outpatient Medications on File Prior to Visit  Medication Sig Dispense Refill  . amitriptyline (ELAVIL) 75 MG tablet TAKE 1 TABLET(75 MG) BY MOUTH AT BEDTIME (Patient taking differently: Take 75 mg by mouth at bedtime. ) 30 tablet 2  . B Complex Vitamins (B-COMPLEX/B-12 PO) Take 1 tablet by mouth daily.     . baclofen (LIORESAL) 10 MG tablet TAKE 2 TABLETS THREE TIMES A DAY (Patient taking differently: Take 20 mg by mouth 3 (three) times daily. ) 360 tablet 5  . celecoxib (CELEBREX) 100 MG capsule TAKE 1 CAPSULE(100 MG) BY MOUTH AT BEDTIME 30 capsule 2  . clotrimazole-betamethasone (LOTRISONE) cream Apply 1 application topically 2 (two) times daily. 90 g 2  . Cranberry 400 MG TABS Take 400 mg by mouth daily.     . Cyanocobalamin (VITAMIN B-12 CR PO) Take 1 tablet by mouth daily.     .  DULoxetine (CYMBALTA) 60 MG capsule TAKE 1 CAPSULE DAILY (Patient taking differently: Take 60 mg by mouth daily. ) 90 capsule 3  . finasteride (PROSCAR) 5 MG tablet Take 5 mg by mouth daily.     Marland Kitchen glucagon 1 MG injection Inject 1 mg into the vein once as needed. 1 each 12  . glucose blood (BAYER CONTOUR NEXT TEST) test strip MEDICALLY NECESSARY FOR USE WITH PUMP; Use to check blood sugar 5 times per day and prn; E11.42 500 each 3  . HYDROcodone-acetaminophen (NORCO/VICODIN) 5-325 MG tablet Take 1 tablet by mouth every 6 (six) hours as needed for moderate pain. 14 tablet 0  . Immune Globulin 10% (IMMUNE GLOBULIN 10%) 10G/154mL (10,000mg /112mL) SOLN Inject 105 g into the vein every 6 (six) weeks for 6 doses. 289.5 mL 0  . Insulin Infusion Pump Supplies (PARADIGM RESERVOIR 3ML) MISC 1 Device by Does not apply route every 3 (three) days. 30 each 3  . Insulin Infusion Pump Supplies (QUICK-SET INFUSION 43" 9MM) MISC 1 Device by Does not apply route every 3 (three) days. 30 each 3  . insulin lispro (HUMALOG) 100 UNIT/ML injection INJECT 120 UNITS DAILY IN PUMP (Patient taking differently: Inject 120 Units into the skin continuous. Via Pump) 120 mL 3  . lidocaine (LIDODERM) 5 % At 7 am and remove at 7 pm. Apply 1 on each side. (Patient taking differently: Place 2 patches onto the skin daily  as needed (pain). ) 180 patch 2  . lisinopril-hydrochlorothiazide (ZESTORETIC) 10-12.5 MG tablet TAKE 1 TABLET DAILY, PLEASE SEE PRIMARY CARE PHYSICIAN FOR ANY FUTURE REFILLS (Patient taking differently: Take 1 tablet by mouth daily. ) 90 tablet 2  . methocarbamol (ROBAXIN-750) 750 MG tablet Take 1 tablet (750 mg total) by mouth 3 (three) times daily as needed for muscle spasms. 90 tablet 1  . Multiple Vitamin (MULTIVITAMIN) capsule Take 1 capsule by mouth daily.     . predniSONE (DELTASONE) 20 MG tablet Take 1 tablet (20 mg total) by mouth as directed. 1 tab twice daily for 2 days then 1 tab once daily for 2 days then off 6  tablet 0  . scopolamine (TRANSDERM-SCOP) 1 MG/3DAYS Place 1 patch (1.5 mg total) onto the skin every 3 (three) days. (Patient taking differently: Place 1 patch onto the skin daily as needed (dizziness). ) 10 patch 12  . tiZANidine (ZANAFLEX) 2 MG tablet Take 1 tablet (2 mg total) by mouth every 8 (eight) hours as needed for muscle spasms. 30 tablet 1   No current facility-administered medications on file prior to visit.    Allergies: No Known Allergies    Vital Signs:  BP 119/72   Pulse 69   Resp 18   Ht 6' (1.829 m)   Wt 227 lb (103 kg)   SpO2 97%   BMI 30.79 kg/m   Neurological Exam: MENTAL STATUS including orientation to time, place, person, recent and remote memory, attention span and concentration, language, and fund of knowledge is normal.  Speech is not dysarthric.  CRANIAL NERVES: Normal conjugate, extra-ocular eye movements in all directions of gaze.  No ptosis.   MOTOR:  There is moderate right FDI and ADM atrophy.  No fasciculations or abnormal movements.  No pronator drift.    Right Upper Extremity:    Left Upper Extremity:    Deltoid  5/5   Deltoid  5/5   Biceps  5/5   Biceps  5/5   Triceps  5/5   Triceps  5/5   Wrist extensors  5/5   Wrist extensors  5/5   Wrist flexors  5/5   Wrist flexors  5/5   Finger extensors  5/5   Finger extensors  5/5   Finger flexors  5/5   Finger flexors  5/5   Dorsal interossei  5-/5   Dorsal interossei  5/5   Abductor pollicis  5/5   Abductor pollicis  5/5   Tone (Ashworth scale)  0  Tone (Ashworth scale)  0   Right Lower Extremity:    Left Lower Extremity:    Hip flexors  5/5   Hip flexors  5/5   Hip extensors  5/5   Hip extensors  5/5   Knee flexors  5/5   Knee flexors  5/5   Knee extensors  5/5   Knee extensors  5/5   Dorsiflexors  5/5   Dorsiflexors  5/5   Plantarflexors  5/5   Plantarflexors  5/5   Toe extensors  5/5   Toe extensors  5/5   Toe flexors  5/5   Toe flexors  5/5   Tone (Ashworth scale)  0+  Tone (Ashworth  scale)  0+    MSRs:  Right  Left brachioradialis 2+  brachioradialis 2+  biceps 2+  biceps 2+  triceps 2+  triceps 2+  patellar 3+  patellar 3+  ankle jerk 2+  ankle jerk 2+  Hoffman no  Hoffman no  plantar response down  plantar response down   SENSORY:  Vibration 100% at the knees, and 60% distal to ankles.    COORDINATION/GAIT: Gait is spastic, assisted with cane. =  Data: MRI cervical and lumbar spine 11/20/2016: 1. No focal cord signal abnormality or lesion. 2. Multilevel spondylosis of the cervical spine as described. 3. Moderate central and mild bilateral foraminal stenosis at C3-4. 4. Moderate central and bilateral foraminal narrowing at C4-5. 5. Moderate central and severe bilateral foraminal stenosis at C5-6. 6. Moderate foraminal narrowing bilaterally at C6-7 without significant central canal stenosis. 7. Short pedicles in the cervical and lumbar spine contribute to the stenosis. 8. Disc bulging at L2-3 and L3-4 without significant stenosis. 9. Disc bulging and facet hypertrophy at L4-5 with mild subarticular and foraminal stenosis bilaterally. 10. Mild left foraminal narrowing at L5-S1 secondary to asymmetric facet hypertrophy.  MRI lumbar spine wo contrast 05/07/2019:  1. New large right paracentral L4-5 disc extrusion with cranial extension within the subarticular recess above the L4-5 level and impinging the right L4 nerve root. 2. Mild progression of lumbar spondylosis at L3-L4 where there is mild left foraminal stenosis at L3-4. 3. Mild to moderate bilateral foraminal stenosis at L4-L5. 4. No canal stenosis at any level.  CSF 11/20/2016:  R1 W3 P81* G102*  No OCB, VDRL neg Labs 11/20/2016:  SPEP with IFE no M protein, 486, TSH 1.051  Lab Results  Component Value Date   HGBA1C 8.8 (H) 03/24/2019   MRI cervical spine 12/31/2016:   1. No enlargement of the cranial nerves or cervical nerve roots. 2.  No acute intracranial abnormality. 3. Multilevel moderate cervical spinal canal stenosis, worst at C3-4 and C5-6 with associated mild cord deformity but no cord signal change. 4. Moderate to severe neural foraminal stenosis at C3-4, C4-5 and C5-6.  NCS/EMG of the right upper and lower extremities 04/27/2017: 1. 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  When compared to his previous study on 11/10/2016, there is mild interval improvement.  2. Chronic C6 radiculopathy affecting the right upper extremity, mild in degree electrically.  3. There is no evidence of a sensorimotor polyneuropathy or lumbosacral radiculopathy affecting the right lower extremity.    IMPRESSION/PLAN: 1.  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 Stable with residual paresthesias in the hands and egs  - Continue IVIG 1g/kg every 6 weeks  2.  Cervical myelopathy s/p decompression at C3-6 with residual spasticity  - Continue baclofen 20mg  three times daily  - Continue to use cane to minimize risk of falls  3.  Right L4 laminectomy s./p decompression, followed by Dr. Kathyrn Sheriff  4.  Chronic pain from neuropathy and myelopathy, followed by Dr. Delice Lesch  Return to clinic in 6 months  Thank you for allowing me to participate in patient's care.  If I can answer any additional questions, I would be pleased to do so.    Sincerely,    Tyquez Hollibaugh K. Posey Pronto, DO

## 2019-05-30 ENCOUNTER — Ambulatory Visit (INDEPENDENT_AMBULATORY_CARE_PROVIDER_SITE_OTHER): Payer: BC Managed Care – PPO | Admitting: Endocrinology

## 2019-05-30 DIAGNOSIS — E1042 Type 1 diabetes mellitus with diabetic polyneuropathy: Secondary | ICD-10-CM

## 2019-05-30 NOTE — Progress Notes (Addendum)
Subjective:    Patient ID: Kenneth Pace, male    DOB: March 08, 1950, 69 y.o.   MRN: [id number]  HPI telehealth visit today via phone x 12 minutes Alternatives to telehealth are presented to this patient, and the patient agrees to the telehealth visit. Pt is advised of the cost of the visit, and agrees to this, also.   Patient is at home, and I am at home.   Persons attending the telehealth visit: the patient and I Pt returns for f/u of diabetes mellitus:  DM type: 1 Dx'ed: [id number] Complications: DR and PN. Therapy: insulin since dx.  DKA: only at dx.   Severe hypoglycemia: most recently in 2014.  Pancreatitis: never Other: he has a medtronic 630 pump and continuous glucose monitor; he is retired. continuous glucose monitor ID is [email or web address] and password is [email or web address].  Interval history:  basal rate of 4.0 units/hr 6 AM-10 PM, and 2.4 units/hr 10 PM-6 AM.   mealtime bolus of 4 units with each meal.   correction bolus (which some people call "sensitivity," or "insulin sensitivity ratio," or just "isr") of 1 unit for each 50 by which glucose exceeds 100.  suspend the pump for 1-2 hrs, with activity.  If not, he eats a light snack with it.   He is recovering from back surgery 2 weeks ago.  He last took steroids 3 weeks ago (brief taper).  He says more recently, cbg varies from 88-225.  It is in general higher as the day goes on.  Past Medical History:  Diagnosis Date  . ADENOIDECTOMY, HX OF 02/16/2007  . Anxiety    pt. states he does not have anxiety.  Marland Kitchen BENIGN PROSTATIC HYPERTROPHY, WITH OBSTRUCTION 02/03/2010  . 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 DIABETES MELLITUS, TYPE I, UNCONTROLLED 12/29/2006  . ERECTILE DYSFUNCTION 02/16/2007  . HYPERLIPIDEMIA 02/16/2007  . HYPERTENSION 02/16/2007  . Nerve pain    per patient "on lower back and both legs"  . Sleep apnea    uses CPAP  . TESTICULAR MASS, LEFT 02/16/2007    Past Surgical  History:  Procedure Laterality Date  . ANTERIOR CERVICAL DECOMP/DISCECTOMY FUSION N/A 02/01/2017   Procedure: ANTERIOR CERVICAL DECOMPRESSION/DISCECTOMY FUSION CERVICAL THREE-FOUR , CERVICAL FOUR-FIVE  CERVICAL FIVE-SIX;  Surgeon: Consuella Lose, MD;  Location: [place];  Service: Neurosurgery;  Laterality: N/A;  . APPENDECTOMY    . COLONOSCOPY  02/05/2010   [facility]  . KNEE SURGERY     2 Lknee arthroscopy, 3 R knee arthroscpoy  . KNEE SURGERY Right 11/12/2016  . LUMBAR LAMINECTOMY/DECOMPRESSION MICRODISCECTOMY Right 05/22/2019   Procedure: LAMINOTOMY AND MICRODISCECTOMY RIGHT LUMBAR FOUR- LUMBAR FIVE;  Surgeon: Consuella Lose, MD;  Location: [place];  Service: Neurosurgery;  Laterality: Right;  LAMINOTOMY AND MICRODISCECTOMY RIGHT LUMBAR FOUR- LUMBAR FIVE  . TONSILLECTOMY      Social History   Socioeconomic History  . Marital status: Married    Spouse name: Not on file  . Number of children: Not on file  . Years of education: Not on file  . Highest education level: Not on file  Occupational History  . Occupation: Lobbyist: [place]    Comment: [employer]  Tobacco Use  . Smoking status: Never Smoker  . Smokeless tobacco: Never Used  Substance and Sexual Activity  . Alcohol use: No  . Drug use: No  . Sexual activity: Not on file  Other Topics Concern  . Not on file  Social History Narrative   He works for [employer]   He lives at home with wife.     Highest level of education:  BS, business admin   Right handed   Two story home   Social Determinants of Health   Financial Resource Strain:   . Difficulty of Paying Living Expenses:   Food Insecurity:   . Worried About Charity fundraiser in the Last Year:   . Arboriculturist in the Last Year:   Transportation Needs:   . Film/video editor (Medical):   Marland Kitchen Lack of Transportation (Non-Medical):   Physical Activity:   . Days of Exercise per Week:   . Minutes of  Exercise per Session:   Stress:   . Feeling of Stress :   Social Connections:   . Frequency of Communication with Friends and Family:   . Frequency of Social Gatherings with Friends and Family:   . Attends Religious Services:   . Active Member of Clubs or Organizations:   . Attends Archivist Meetings:   Marland Kitchen Marital Status:   Intimate Partner Violence:   . Fear of Current or Ex-Partner:   . Emotionally Abused:   Marland Kitchen Physically Abused:   . Sexually Abused:     Current Outpatient Medications on File Prior to Visit  Medication Sig Dispense Refill  . amitriptyline (ELAVIL) 75 MG tablet TAKE 1 TABLET(75 MG) BY MOUTH AT BEDTIME (Patient taking differently: Take 75 mg by mouth at bedtime. ) 30 tablet 2  . B Complex Vitamins (B-COMPLEX/B-12 PO) Take 1 tablet by mouth daily.     . baclofen (LIORESAL) 10 MG tablet Take 2 tablets (20 mg total) by mouth 3 (three) times daily. 540 tablet 3  . celecoxib (CELEBREX) 100 MG capsule TAKE 1 CAPSULE(100 MG) BY MOUTH AT BEDTIME 30 capsule 2  . clotrimazole-betamethasone (LOTRISONE) cream Apply 1 application topically 2 (two) times daily. 90 g 2  . Cranberry 400 MG TABS Take 400 mg by mouth daily.     . Cyanocobalamin (VITAMIN B-12 CR PO) Take 1 tablet by mouth daily.     . DULoxetine (CYMBALTA) 60 MG capsule TAKE 1 CAPSULE DAILY (Patient taking differently: Take 60 mg by mouth daily. ) 90 capsule 3  . finasteride (PROSCAR) 5 MG tablet Take 5 mg by mouth daily.     Marland Kitchen glucagon 1 MG injection Inject 1 mg into the vein once as needed. 1 each 12  . glucose blood (BAYER CONTOUR NEXT TEST) test strip MEDICALLY NECESSARY FOR USE WITH PUMP; Use to check blood sugar 5 times per day and prn; E11.42 500 each 3  . HYDROcodone-acetaminophen (NORCO/VICODIN) 5-325 MG tablet Take 1 tablet by mouth every 6 (six) hours as needed for moderate pain. 14 tablet 0  . Immune Globulin 10% (IMMUNE GLOBULIN 10%) 10G/135mL (10,000mg /134mL) SOLN Inject 105 g into the vein every 6  (six) weeks for 6 doses. 289.5 mL 0  . Insulin Infusion Pump Supplies (PARADIGM RESERVOIR 3ML) MISC 1 Device by Does not apply route every 3 (three) days. 30 each 3  . Insulin Infusion Pump Supplies (QUICK-SET INFUSION 43" 9MM) MISC 1 Device by Does not apply route every 3 (three) days. 30 each 3  . insulin lispro (HUMALOG) 100 UNIT/ML injection INJECT 120 UNITS DAILY IN PUMP (Patient taking differently: Inject 120 Units into the skin continuous. Via Pump) 120 mL 3  . lidocaine (LIDODERM) 5 % At 7 am and remove at  7 pm. Apply 1 on each side. (Patient taking differently: Place 2 patches onto the skin daily as needed (pain). ) 180 patch 2  . lisinopril-hydrochlorothiazide (ZESTORETIC) 10-12.5 MG tablet TAKE 1 TABLET DAILY, PLEASE SEE PRIMARY CARE PHYSICIAN FOR ANY FUTURE REFILLS (Patient taking differently: Take 1 tablet by mouth daily. ) 90 tablet 2  . methocarbamol (ROBAXIN-750) 750 MG tablet Take 1 tablet (750 mg total) by mouth 3 (three) times daily as needed for muscle spasms. 90 tablet 1  . Multiple Vitamin (MULTIVITAMIN) capsule Take 1 capsule by mouth daily.     . predniSONE (DELTASONE) 20 MG tablet Take 1 tablet (20 mg total) by mouth as directed. 1 tab twice daily for 2 days then 1 tab once daily for 2 days then off 6 tablet 0  . scopolamine (TRANSDERM-SCOP) 1 MG/3DAYS Place 1 patch (1.5 mg total) onto the skin every 3 (three) days. (Patient taking differently: Place 1 patch onto the skin daily as needed (dizziness). ) 10 patch 12  . tiZANidine (ZANAFLEX) 2 MG tablet Take 1 tablet (2 mg total) by mouth every 8 (eight) hours as needed for muscle spasms. 30 tablet 1   No current facility-administered medications on file prior to visit.    No Known Allergies  Family History  Problem Relation Age of Onset  . Dementia Mother   . Diabetes Mellitus I Mother   . Hypertension Father        1  . Healthy Sister   . Rheum arthritis Brother   . Cancer Neg Hx   . Colon cancer Neg Hx   .  Esophageal cancer Neg Hx   . Stomach cancer Neg Hx   . Rectal cancer Neg Hx   . Colon polyps Neg Hx     There were no vitals taken for this visit.   Review of Systems He denies hypoglycemia.      Objective:   Physical Exam       Assessment & Plan:  Type 1 DM: worse  Patient Instructions  Please take these pump settings: basal rate of 4.5 units/hr 6 AM-10 PM, and 2.2 units/hr 10 PM-6 AM mealtime bolus of 4 units with each meal.   correction bolus (which some people call "sensitivity," or "insulin sensitivity ratio," or just "isr") of 1 unit for each 50 by which your glucose exceeds 100.  suspend the pump for 1-2 hrs, with activity.  If not, eat a light snack with it.   On this type of insulin schedule, you should eat meals on a regular schedule.  If a meal is missed or significantly delayed, your blood sugar could go low.   Please come back for a follow-up appointment in 1 month.

## 2019-05-30 NOTE — Patient Instructions (Addendum)
Please take these pump settings: basal rate of 4.5 units/hr 6 AM-10 PM, and 2.2 units/hr 10 PM-6 AM mealtime bolus of 4 units with each meal.   correction bolus (which some people call "sensitivity," or "insulin sensitivity ratio," or just "isr") of 1 unit for each 50 by which your glucose exceeds 100.  suspend the pump for 1-2 hrs, with activity.  If not, eat a light snack with it.   On this type of insulin schedule, you should eat meals on a regular schedule.  If a meal is missed or significantly delayed, your blood sugar could go low.   Please come back for a follow-up appointment in 1 month.

## 2019-05-31 DIAGNOSIS — G4733 Obstructive sleep apnea (adult) (pediatric): Secondary | ICD-10-CM | POA: Diagnosis not present

## 2019-05-31 NOTE — Progress Notes (Signed)
This encounter was created in error - please disregard.

## 2019-06-01 ENCOUNTER — Other Ambulatory Visit: Payer: Self-pay

## 2019-06-01 ENCOUNTER — Telehealth: Payer: Self-pay

## 2019-06-01 NOTE — Telephone Encounter (Signed)
FAXED DOCUMENTS - FAILED 19 TIMES. RESORTED TO FAXING TO LOCAL REP  Company: [employer] - FAILED x19. FAXED TO [facility] - LOCAL REP  Document: CMN for pump and supplies Other records requested: None requested  All above requested information has been faxed successfully to the Company listed above. Documents and fax confirmation have been placed in the faxed file for future reference.

## 2019-06-02 ENCOUNTER — Other Ambulatory Visit: Payer: Self-pay | Admitting: Family Medicine

## 2019-06-05 ENCOUNTER — Encounter: Payer: Self-pay | Admitting: Physical Medicine & Rehabilitation

## 2019-06-05 ENCOUNTER — Encounter
Payer: BC Managed Care – PPO | Attending: Physical Medicine & Rehabilitation | Admitting: Physical Medicine & Rehabilitation

## 2019-06-05 ENCOUNTER — Other Ambulatory Visit: Payer: Self-pay

## 2019-06-05 VITALS — BP 156/77 | HR 86 | Temp 98.3°F | Ht 72.0 in | Wt 229.6 lb

## 2019-06-05 DIAGNOSIS — G6181 Chronic inflammatory demyelinating polyneuritis: Secondary | ICD-10-CM | POA: Insufficient documentation

## 2019-06-05 DIAGNOSIS — M791 Myalgia, unspecified site: Secondary | ICD-10-CM | POA: Insufficient documentation

## 2019-06-05 DIAGNOSIS — G959 Disease of spinal cord, unspecified: Secondary | ICD-10-CM | POA: Insufficient documentation

## 2019-06-05 DIAGNOSIS — G8929 Other chronic pain: Secondary | ICD-10-CM | POA: Diagnosis not present

## 2019-06-05 DIAGNOSIS — R269 Unspecified abnormalities of gait and mobility: Secondary | ICD-10-CM | POA: Insufficient documentation

## 2019-06-05 DIAGNOSIS — I10 Essential (primary) hypertension: Secondary | ICD-10-CM | POA: Insufficient documentation

## 2019-06-05 DIAGNOSIS — G8918 Other acute postprocedural pain: Secondary | ICD-10-CM

## 2019-06-05 DIAGNOSIS — M792 Neuralgia and neuritis, unspecified: Secondary | ICD-10-CM

## 2019-06-05 DIAGNOSIS — M545 Low back pain: Secondary | ICD-10-CM

## 2019-06-05 DIAGNOSIS — M5416 Radiculopathy, lumbar region: Secondary | ICD-10-CM

## 2019-06-05 DIAGNOSIS — R261 Paralytic gait: Secondary | ICD-10-CM

## 2019-06-05 NOTE — Progress Notes (Signed)
Subjective:    Patient ID: Kenneth Pace, male    DOB: 04-Feb-1951, 69 y.o.   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   Last clinic visit on 04/25/2019.  Since that time, patient had right radiating leg pain.  He presented to the ED.  Imaging performed revealing disc herniation and L4 nerve impingement.  Since that time, he had decompression by neurosurgery.  Since that time, patient states his pain is improving. Notes reviewed by Surgery - L4 decompression.    Pain Inventory Average Pain 3 Pain Right Now 3 My pain is constant and dull  In the last 24 hours, has pain interfered with the following? General activity 2 Relation with others 0 Enjoyment of life 2 What TIME of day is your pain at its worst? varies Sleep?   Good Pain is worse with: some activites Pain improves with: rest and medication Relief from Meds: 7  Mobility walk without assistance walk with assistance use a cane ability to climb steps?  yes do you drive?  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  no  Physicians involved in your care Any changes since last visit?  yes Neurosurgeon [place]   Family History  Problem Relation Age of Onset  . Dementia Mother   . Diabetes Mellitus I Mother   . Hypertension Father        8  . Healthy Sister   . Rheum arthritis Brother   . Cancer Neg Hx   . Colon cancer Neg Hx   . Esophageal cancer Neg Hx   . Stomach cancer Neg Hx   . Rectal cancer Neg Hx   . Colon polyps Neg Hx    Social History   Socioeconomic History  . Marital status: Married    Spouse name: Not on file  . Number of children: Not on file  . Years of education: Not on file  .  Highest education level: Not on file  Occupational History  . Occupation: Lobbyist: [employer]    Comment: [employer]  Tobacco Use  . Smoking status: Never Smoker  . Smokeless tobacco: Never Used  Substance and Sexual Activity  . Alcohol use: No  . Drug use: No  . Sexual activity: Not on file  Other Topics Concern  . Not on file  Social History Narrative   He works for [employer]   He lives at home with wife.     Highest level of education:  BS, business admin   Right handed   Two story home   Social Determinants of Health   Financial Resource Strain:   . Difficulty of Paying Living Expenses:   Food Insecurity:   . Worried About Charity fundraiser in the Last Year:   . Arboriculturist in the Last Year:   Transportation Needs:   . Film/video editor (Medical):   Marland Kitchen Lack of Transportation (Non-Medical):   Physical Activity:   . Days of Exercise per Week:   . Minutes of Exercise per Session:   Stress:   . Feeling of Stress :   Social Connections:   . Frequency of Communication with  Friends and Family:   . Frequency of Social Gatherings with Friends and Family:   . Attends Religious Services:   . Active Member of Clubs or Organizations:   . Attends Archivist Meetings:   Marland Kitchen Marital Status:    Past Surgical History:  Procedure Laterality Date  . ANTERIOR CERVICAL DECOMP/DISCECTOMY FUSION N/A 02/01/2017   Procedure: ANTERIOR CERVICAL DECOMPRESSION/DISCECTOMY FUSION CERVICAL THREE-FOUR , CERVICAL FOUR-FIVE  CERVICAL FIVE-SIX;  Surgeon: Consuella Lose, MD;  Location: [place];  Service: Neurosurgery;  Laterality: N/A;  . APPENDECTOMY    . COLONOSCOPY  02/05/2010   [facility]  . KNEE SURGERY     2 Lknee arthroscopy, 3 R knee arthroscpoy  . KNEE SURGERY Right 11/12/2016  . LUMBAR LAMINECTOMY/DECOMPRESSION MICRODISCECTOMY Right 05/22/2019   Procedure: LAMINOTOMY AND MICRODISCECTOMY RIGHT LUMBAR FOUR- LUMBAR  FIVE;  Surgeon: Consuella Lose, MD;  Location: [place];  Service: Neurosurgery;  Laterality: Right;  LAMINOTOMY AND MICRODISCECTOMY RIGHT LUMBAR FOUR- LUMBAR FIVE  . TONSILLECTOMY     Past Medical History:  Diagnosis Date  . ADENOIDECTOMY, HX OF 02/16/2007  . Anxiety    pt. states he does not have anxiety.  Marland Kitchen BENIGN PROSTATIC HYPERTROPHY, WITH OBSTRUCTION 02/03/2010  . 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 DIABETES MELLITUS, TYPE I, UNCONTROLLED 12/29/2006  . ERECTILE DYSFUNCTION 02/16/2007  . HYPERLIPIDEMIA 02/16/2007  . HYPERTENSION 02/16/2007  . Nerve pain    per patient "on lower back and both legs"  . Sleep apnea    uses CPAP  . TESTICULAR MASS, LEFT 02/16/2007   BP (!) 156/77   Pulse 86   Temp 98.3 F (36.8 C)   Ht 6' (1.829 m)   Wt 229 lb 9.6 oz (104.1 kg)   SpO2 97%   BMI 31.14 kg/m   Opioid Risk Score:   Fall Risk Score:  `1  Depression screen PHQ 2/9  Depression screen [facility] 2/9 12/10/2017 07/07/2017 02/26/2017 02/26/2017  Decreased Interest 0 0 0 0  Down, Depressed, Hopeless 0 0 0 0  PHQ - 2 Score 0 0 0 0  Altered sleeping - - 1 -  Tired, decreased energy - - 0 -  Change in appetite - - 0 -  Feeling bad or failure about yourself  - - 0 -  Trouble concentrating - - 0 -  Moving slowly or fidgety/restless - - 1 -  Suicidal thoughts - - 0 -  PHQ-9 Score - - 2 -  Difficult doing work/chores - - Not difficult at all -  Some recent data might be hidden   Review of Systems  Constitutional: Negative.   HENT: Negative.   Eyes: Negative.   Respiratory: Negative.   Cardiovascular: Positive for leg swelling.  Gastrointestinal: Negative.   Endocrine: Negative.   Genitourinary: Negative.   Musculoskeletal: Positive for arthralgias, back pain, joint swelling and myalgias.  Skin: Negative.   Allergic/Immunologic: Negative.   Neurological: Positive for weakness and numbness.           Hematological: Negative.    Psychiatric/Behavioral: Negative.       Objective:   Physical Exam  Constitutional: No distress . Vital signs reviewed. HENT: Normocephalic.  Atraumatic. Eyes: EOMI. No discharge. Cardiovascular: No JVD. Respiratory: Normal effort.  No stridor. GI: Non-distended. Skin: Warm and dry.  Intact. Psych: Normal mood.  Normal behavior. 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   1.  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  Cont HEP  Cont follow up Neurology -IVIG per Neurology  Cont work   2.  Pain Management:   Gabapentin and Lyrica d/ced due to edema  No benefit with Mobic, Naproxen  Cont Lidoderm patch  Continue TENS  Continue Elavil to 75 qhs   Continue Cymbalta to 60mg  with food  Continue Baclofen per Neurology  Cont Tylenol  Continue Celebrex 100 qhs - labs reviewed, LFTs WNL  3. Gait abnormality  Continue HEP  Less reliant on cane  4. Mechanical low back - multifactorial  Myalgia +/- facet arthropathy  MRI L-spine reviewed, multilevel predominantly foraminal and facet arthropathy  Cont Balcofen per Neurology  Cont Cymbalta   Note written for gym participation - discussed safety, but unable to go because it is still closed   5. Myalgia  Good benefit with TP injections, again on 3/9  See #1, #4  6. Spasticity  See #1,2,3  Continue Baclofen  Cont therapies  Will consider further intervention after therapies  7. Essential HTN  Follow up with PCP  8. Lumbar radiculopathy  S/p decompression right L4

## 2019-06-12 ENCOUNTER — Ambulatory Visit: Payer: BC Managed Care – PPO | Admitting: Neurology

## 2019-06-29 ENCOUNTER — Other Ambulatory Visit: Payer: Self-pay

## 2019-06-30 ENCOUNTER — Other Ambulatory Visit: Payer: Self-pay | Admitting: Family Medicine

## 2019-07-03 ENCOUNTER — Other Ambulatory Visit: Payer: Self-pay

## 2019-07-03 ENCOUNTER — Encounter: Payer: Self-pay | Admitting: Endocrinology

## 2019-07-03 ENCOUNTER — Ambulatory Visit (INDEPENDENT_AMBULATORY_CARE_PROVIDER_SITE_OTHER): Payer: BC Managed Care – PPO | Admitting: Endocrinology

## 2019-07-03 VITALS — BP 124/70 | HR 90 | Ht 72.0 in | Wt 237.0 lb

## 2019-07-03 DIAGNOSIS — E1042 Type 1 diabetes mellitus with diabetic polyneuropathy: Secondary | ICD-10-CM

## 2019-07-03 LAB — POCT GLYCOSYLATED HEMOGLOBIN (HGB A1C): Hemoglobin A1C: 8.7 % — AB (ref 4.0–5.6)

## 2019-07-03 NOTE — Patient Instructions (Addendum)
Please take these pump settings: basal rate of 4.5 units/hr 6 AM-10 PM, and 2.6 units/hr 10 PM-6 AM mealtime bolus of 3 times a day (just before each meal) 4-4-8 units correction bolus (which some people call "sensitivity," or "insulin sensitivity ratio," or just "isr") of 1 unit for each 50 by which your glucose exceeds 100.  suspend the pump for 1-2 hrs, with activity.  If not, eat a light snack with it.   On this type of insulin schedule, you should eat meals on a regular schedule.  If a meal is missed or significantly delayed, your blood sugar could go low.   Please come back for a follow-up appointment in 2 months.

## 2019-07-03 NOTE — Progress Notes (Signed)
Subjective:    Patient ID: Kenneth Pace, male    DOB: 1950-10-27, 69 y.o.   MRN: [id number]  HPI Pt returns for f/u of diabetes mellitus:  DM type: 1 Dx'ed: [id number] Complications: DR and PN. Therapy: insulin since dx.  DKA: only at dx.   Severe hypoglycemia: most recently in 2014.  Pancreatitis: never Other: he has a medtronic 630 pump and continuous glucose monitor; he is retired. continuous glucose monitor ID is [email or web address] and password is [email or web address].  Interval history: he takes these pump settings: basal rate of 4.5 units/hr 6 AM-10 PM, 2.2 units/hr 10 PM-12MN, and 2.5/HR, 12MN-6AM mealtime bolus of 4 units with each meal.   correction bolus (which some people call "sensitivity," or "insulin sensitivity ratio," or just "isr") of 1 unit for each 50 by which glucose exceeds 100.  He suspends the pump for 1-2 hrs, with activity.  If not, eat a light snack with it.   No recent steroids.  He is pursuing the 670 pump.   TDD is approx 125 units.   I reviewed continuous glucose monitor data.  Glucose varies from 66-290.  It is in general highest 7PM-9AM Past Medical History:  Diagnosis Date  . ADENOIDECTOMY, HX OF 02/16/2007  . Anxiety    pt. states he does not have anxiety.  Marland Kitchen BENIGN PROSTATIC HYPERTROPHY, WITH OBSTRUCTION 02/03/2010  . 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 DIABETES MELLITUS, TYPE I, UNCONTROLLED 12/29/2006  . ERECTILE DYSFUNCTION 02/16/2007  . HYPERLIPIDEMIA 02/16/2007  . HYPERTENSION 02/16/2007  . Nerve pain    per patient "on lower back and both legs"  . Sleep apnea    uses CPAP  . TESTICULAR MASS, LEFT 02/16/2007    Past Surgical History:  Procedure Laterality Date  . ANTERIOR CERVICAL DECOMP/DISCECTOMY FUSION N/A 02/01/2017   Procedure: ANTERIOR CERVICAL DECOMPRESSION/DISCECTOMY FUSION CERVICAL THREE-FOUR , CERVICAL FOUR-FIVE  CERVICAL FIVE-SIX;  Surgeon: Consuella Lose, MD;  Location: [place];   Service: Neurosurgery;  Laterality: N/A;  . APPENDECTOMY    . COLONOSCOPY  02/05/2010   [facility]  . KNEE SURGERY     2 Lknee arthroscopy, 3 R knee arthroscpoy  . KNEE SURGERY Right 11/12/2016  . LUMBAR LAMINECTOMY/DECOMPRESSION MICRODISCECTOMY Right 05/22/2019   Procedure: LAMINOTOMY AND MICRODISCECTOMY RIGHT LUMBAR FOUR- LUMBAR FIVE;  Surgeon: Consuella Lose, MD;  Location: [place];  Service: Neurosurgery;  Laterality: Right;  LAMINOTOMY AND MICRODISCECTOMY RIGHT LUMBAR FOUR- LUMBAR FIVE  . TONSILLECTOMY      Social History   Socioeconomic History  . Marital status: Married    Spouse name: Not on file  . Number of children: Not on file  . Years of education: Not on file  . Highest education level: Not on file  Occupational History  . 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 Smoking status: Never Smoker  . Smokeless tobacco: Never Used  Substance and Sexual Activity  . Alcohol use: No  . Drug use: No  . Sexual activity: Not on file  Other Topics Concern  . Not on file  Social History Narrative   He works for [employer]   He lives at home with wife.     Highest level of education:  BS, business admin   Right handed   Two story home   Social Determinants of Health   Financial Resource Strain:   . Difficulty of  Paying Living Expenses:   Food Insecurity:   . Worried About Charity fundraiser in the Last Year:   . Arboriculturist in the Last Year:   Transportation Needs:   . Film/video editor (Medical):   Marland Kitchen Lack of Transportation (Non-Medical):   Physical Activity:   . Days of Exercise per Week:   . Minutes of Exercise per Session:   Stress:   . Feeling of Stress :   Social Connections:   . Frequency of Communication with Friends and Family:   . Frequency of Social Gatherings with Friends and Family:   . Attends Religious Services:   . Active Member of Clubs or Organizations:    . Attends Archivist Meetings:   Marland Kitchen Marital Status:   Intimate Partner Violence:   . Fear of Current or Ex-Partner:   . Emotionally Abused:   Marland Kitchen Physically Abused:   . Sexually Abused:     Current Outpatient Medications on File Prior to Visit  Medication Sig Dispense Refill  . amitriptyline (ELAVIL) 75 MG tablet TAKE 1 TABLET(75 MG) BY MOUTH AT BEDTIME (Patient taking differently: Take 75 mg by mouth at bedtime. ) 30 tablet 2  . B Complex Vitamins (B-COMPLEX/B-12 PO) Take 1 tablet by mouth daily.     . baclofen (LIORESAL) 10 MG tablet Take 2 tablets (20 mg total) by mouth 3 (three) times daily. 540 tablet 3  . celecoxib (CELEBREX) 100 MG capsule TAKE 1 CAPSULE(100 MG) BY MOUTH AT BEDTIME 30 capsule 2  . clotrimazole-betamethasone (LOTRISONE) cream Apply 1 application topically 2 (two) times daily. 90 g 2  . Cranberry 400 MG TABS Take 400 mg by mouth daily.     . Cyanocobalamin (VITAMIN B-12 CR PO) Take 1 tablet by mouth daily.     . DULoxetine (CYMBALTA) 60 MG capsule TAKE 1 CAPSULE DAILY (Patient taking differently: Take 60 mg by mouth daily. ) 90 capsule 3  . finasteride (PROSCAR) 5 MG tablet Take 5 mg by mouth daily.     Marland Kitchen glucagon 1 MG injection Inject 1 mg into the vein once as needed. 1 each 12  . glucose blood (BAYER CONTOUR NEXT TEST) test strip MEDICALLY NECESSARY FOR USE WITH PUMP; Use to check blood sugar 5 times per day and prn; E11.42 500 each 3  . HYDROcodone-acetaminophen (NORCO/VICODIN) 5-325 MG tablet Take 1 tablet by mouth every 6 (six) hours as needed for moderate pain. 14 tablet 0  . Immune Globulin 10% (IMMUNE GLOBULIN 10%) 10G/131mL (10,000mg /183mL) SOLN Inject 105 g into the vein every 6 (six) weeks for 6 doses. 289.5 mL 0  . Insulin Infusion Pump Supplies (PARADIGM RESERVOIR 3ML) MISC 1 Device by Does not apply route every 3 (three) days. 30 each 3  . Insulin Infusion Pump Supplies (QUICK-SET INFUSION 43" 9MM) MISC 1 Device by Does not apply route every 3  (three) days. 30 each 3  . insulin lispro (HUMALOG) 100 UNIT/ML injection INJECT 120 UNITS DAILY IN PUMP (Patient taking differently: Inject 120 Units into the skin continuous. Via Pump) 120 mL 3  . lidocaine (LIDODERM) 5 % At 7 am and remove at 7 pm. Apply 1 on each side. (Patient taking differently: Place 2 patches onto the skin daily as needed (pain). ) 180 patch 2  . lisinopril-hydrochlorothiazide (ZESTORETIC) 10-12.5 MG tablet TAKE 1 TABLET DAILY. PLEASE SEE PRIMARY CARE PHYSICIAN FOR ANY FUTURE REFILLS 90 tablet 3  . methocarbamol (ROBAXIN-750) 750 MG tablet Take 1 tablet (750 mg  total) by mouth 3 (three) times daily as needed for muscle spasms. 90 tablet 1  . Multiple Vitamin (MULTIVITAMIN) capsule Take 1 capsule by mouth daily.     . predniSONE (DELTASONE) 20 MG tablet Take 1 tablet (20 mg total) by mouth as directed. 1 tab twice daily for 2 days then 1 tab once daily for 2 days then off 6 tablet 0  . scopolamine (TRANSDERM-SCOP) 1 MG/3DAYS Place 1 patch (1.5 mg total) onto the skin every 3 (three) days. (Patient taking differently: Place 1 patch onto the skin daily as needed (dizziness). ) 10 patch 12  . tiZANidine (ZANAFLEX) 2 MG tablet Take 1 tablet (2 mg total) by mouth every 8 (eight) hours as needed for muscle spasms. 30 tablet 1   No current facility-administered medications on file prior to visit.    No Known Allergies  Family History  Problem Relation Age of Onset  . Dementia Mother   . Diabetes Mellitus I Mother   . Hypertension Father        92  . Healthy Sister   . Rheum arthritis Brother   . Cancer Neg Hx   . Colon cancer Neg Hx   . Esophageal cancer Neg Hx   . Stomach cancer Neg Hx   . Rectal cancer Neg Hx   . Colon polyps Neg Hx     BP 124/70   Pulse 90   Ht 6' (1.829 m)   Wt 237 lb (107.5 kg)   SpO2 95%   BMI 32.14 kg/m   Review of Systems Denies LOC    Objective:   Physical Exam VITAL SIGNS:  See vs page GENERAL: no distress Pulses: dorsalis  pedis intact bilat.   MSK: no deformity of the feet CV: 2+ bilat leg edema Skin:  no ulcer on the feet.  normal color and temp on the feet.  Moderate patchy red rash on the feet is again noted Neuro: sensation is intact to touch on the feet.   Ext: there is bilateral onychomycosis of the toenails.    Lab Results  Component Value Date   HGBA1C 8.7 (A) 07/03/2019   Lab Results  Component Value Date   CREATININE 0.95 05/23/2019   BUN 20 05/23/2019   NA 137 05/23/2019   K 4.4 05/23/2019   CL 103 05/23/2019   CO2 24 05/23/2019       Assessment & Plan:  Type 1 DM, with PN: he needs increased rx.  Hypoglycemia, due to insulin: this limits aggressiveness of glycemic control.    Patient Instructions  Please take these pump settings: basal rate of 4.5 units/hr 6 AM-10 PM, and 2.6 units/hr 10 PM-6 AM mealtime bolus of 3 times a day (just before each meal) 4-4-8 units correction bolus (which some people call "sensitivity," or "insulin sensitivity ratio," or just "isr") of 1 unit for each 50 by which your glucose exceeds 100.  suspend the pump for 1-2 hrs, with activity.  If not, eat a light snack with it.   On this type of insulin schedule, you should eat meals on a regular schedule.  If a meal is missed or significantly delayed, your blood sugar could go low.   Please come back for a follow-up appointment in 2 months.

## 2019-07-07 ENCOUNTER — Inpatient Hospital Stay (HOSPITAL_COMMUNITY): Admission: RE | Admit: 2019-07-07 | Payer: BC Managed Care – PPO | Source: Ambulatory Visit

## 2019-07-20 ENCOUNTER — Other Ambulatory Visit: Payer: Self-pay

## 2019-07-20 ENCOUNTER — Ambulatory Visit (HOSPITAL_COMMUNITY)
Admission: RE | Admit: 2019-07-20 | Discharge: 2019-07-20 | Disposition: A | Discharge status: Home / Self Care | Payer: BC Managed Care – PPO | Source: Ambulatory Visit | Attending: Neurology | Admitting: Neurology

## 2019-07-20 DIAGNOSIS — G6181 Chronic inflammatory demyelinating polyneuritis: Secondary | ICD-10-CM | POA: Diagnosis not present

## 2019-07-20 MED ORDER — PRIVIGEN 10 GM/100ML IV SOLN: 1.0000 g/kg | INTRAVENOUS | 10 refills | Status: DC

## 2019-07-20 MED ORDER — IMMUNE GLOBULIN (HUMAN) 10 GM/100ML IV SOLN
1.0000 g/kg | Freq: Once | INTRAVENOUS | Status: DC
  Administered 2019-07-20: 105 g via INTRAVENOUS
  Filled 2019-07-20: qty 1000

## 2019-08-02 ENCOUNTER — Other Ambulatory Visit: Payer: Self-pay | Admitting: *Deleted

## 2019-08-02 MED ORDER — AMITRIPTYLINE HCL 75 MG PO TABS: ORAL_TABLET | ORAL | 2 refills | Status: DC

## 2019-08-07 ENCOUNTER — Other Ambulatory Visit: Payer: Self-pay | Admitting: *Deleted

## 2019-08-07 MED ORDER — AMITRIPTYLINE HCL 75 MG PO TABS: ORAL_TABLET | ORAL | 1 refills | Status: DC

## 2019-08-11 ENCOUNTER — Encounter (HOSPITAL_COMMUNITY): Payer: BC Managed Care – PPO

## 2019-08-16 DIAGNOSIS — E109 Type 1 diabetes mellitus without complications: Secondary | ICD-10-CM | POA: Diagnosis not present

## 2019-08-28 ENCOUNTER — Telehealth: Payer: Self-pay | Admitting: Nutrition

## 2019-08-28 NOTE — Telephone Encounter (Signed)
Appointment scheduled to start his 770G pump next Wednesday at [facility]

## 2019-08-29 ENCOUNTER — Encounter: Payer: Self-pay | Admitting: Hematology & Oncology

## 2019-08-29 DIAGNOSIS — H04123 Dry eye syndrome of bilateral lacrimal glands: Secondary | ICD-10-CM | POA: Diagnosis not present

## 2019-08-29 DIAGNOSIS — H2513 Age-related nuclear cataract, bilateral: Secondary | ICD-10-CM | POA: Diagnosis not present

## 2019-08-29 DIAGNOSIS — H47323 Drusen of optic disc, bilateral: Secondary | ICD-10-CM | POA: Diagnosis not present

## 2019-08-29 DIAGNOSIS — E119 Type 2 diabetes mellitus without complications: Secondary | ICD-10-CM | POA: Diagnosis not present

## 2019-08-29 LAB — HM DIABETES EYE EXAM

## 2019-08-30 ENCOUNTER — Encounter: Payer: BC Managed Care – PPO | Admitting: Nutrition

## 2019-08-31 ENCOUNTER — Encounter (HOSPITAL_COMMUNITY): Payer: BC Managed Care – PPO

## 2019-09-01 ENCOUNTER — Other Ambulatory Visit: Payer: Self-pay

## 2019-09-01 ENCOUNTER — Ambulatory Visit (HOSPITAL_COMMUNITY)
Admission: RE | Admit: 2019-09-01 | Discharge: 2019-09-01 | Disposition: A | Discharge status: Home / Self Care | Payer: BC Managed Care – PPO | Source: Ambulatory Visit | Attending: Neurology | Admitting: Neurology

## 2019-09-01 DIAGNOSIS — G6181 Chronic inflammatory demyelinating polyneuritis: Secondary | ICD-10-CM | POA: Diagnosis not present

## 2019-09-01 MED ORDER — IMMUNE GLOBULIN (HUMAN) 10 GM/100ML IV SOLN
1.0000 g/kg | Freq: Once | INTRAVENOUS | Status: AC
  Administered 2019-09-01: 105 g via INTRAVENOUS
  Filled 2019-09-01: qty 1000
  Filled 2019-09-01: qty 1100

## 2019-09-06 ENCOUNTER — Other Ambulatory Visit: Payer: Self-pay

## 2019-09-06 ENCOUNTER — Encounter: Payer: BC Managed Care – PPO | Attending: Family Medicine | Admitting: Nutrition

## 2019-09-06 DIAGNOSIS — E119 Type 2 diabetes mellitus without complications: Secondary | ICD-10-CM | POA: Insufficient documentation

## 2019-09-07 NOTE — Progress Notes (Signed)
Patient was identified by name and DOB.  He is here to transition from his 630 to a 770G pump.  Settings were transferred by patient to he new pump:  Basal rate: MN: 2.6, 6AM: 4.5, 10PM: 2.6.  His work day basal is MN: 2.6, 6AM: 3.5., 10PM: 2.6.  ISF: 60 target 120.    He is not counting carbs, but we discussed this and he does eat varing carbs at each meal.  We discussed that it would be more precise if he counting carbs.  He agreed to do this and knows 15 gram carb portions.  He was given a list for these to review.   He did not bring his meter to link to the new pump.  He was shown the steps on how to do this.  He will call if he has difficulty. He is wearing an enlite sensor, and does not have the new sensors to link to this pump.  He was given the telephone number to call to get the new sensors, and will call me if he has problems with getting these.  He was then instructed on how to put the pump in suspend before low, and to set the pump low settings on 70 and the high on 250.  He agreed to do this.   He did not know know his Medtronic user name and password, to link his new pump. He has agreed to call Medtronic to get this and get help in downloading the new app to allow [place] to link him.   He was told that he needs to wear his new pump with new sensors for 7 days, and that he will need to return to start him in the auto mode.   I will call him tomorrow to see if he was able to order the new Enlite 2 sensors.

## 2019-09-07 NOTE — Patient Instructions (Signed)
Call Medtronic and order the Enlite 2 sensors.  Call Medtronic to recover your user name and password for [facility] Link new sensor to new pump, and turn the suspend before low feature on. Return in one week to start Auto mode

## 2019-09-08 ENCOUNTER — Encounter: Payer: Self-pay | Admitting: Endocrinology

## 2019-09-08 ENCOUNTER — Other Ambulatory Visit: Payer: Self-pay

## 2019-09-08 ENCOUNTER — Ambulatory Visit (INDEPENDENT_AMBULATORY_CARE_PROVIDER_SITE_OTHER): Payer: BC Managed Care – PPO | Admitting: Endocrinology

## 2019-09-08 VITALS — BP 120/76 | HR 96 | Ht 72.0 in | Wt 238.2 lb

## 2019-09-08 DIAGNOSIS — G4733 Obstructive sleep apnea (adult) (pediatric): Secondary | ICD-10-CM | POA: Diagnosis not present

## 2019-09-08 DIAGNOSIS — E1042 Type 1 diabetes mellitus with diabetic polyneuropathy: Secondary | ICD-10-CM | POA: Diagnosis not present

## 2019-09-08 LAB — POCT GLYCOSYLATED HEMOGLOBIN (HGB A1C): Hemoglobin A1C: 8.1 % — AB (ref 4.0–5.6)

## 2019-09-08 NOTE — Patient Instructions (Addendum)
Please take these pump settings: basal rate of 4.5 units/hr 6 AM-10 PM, and 2.6 units/hr 10 PM-6 AM mealtime bolus of 3 times a day (just before each meal) 4-6-8 units correction bolus (which some people call "sensitivity," or "insulin sensitivity ratio," or just "isr") of 1 unit for each 50 by which your glucose exceeds 100.  suspend the pump for 1-2 hrs, with activity.  If not, eat a light snack with it.   On this type of insulin schedule, you should eat meals on a regular schedule.  If a meal is missed or significantly delayed, your blood sugar could go low.  When you receive the sensors, please see Kenneth Pace to get started.  Please come back for a follow-up appointment 1-2 weeks after starting the new pump.

## 2019-09-08 NOTE — Progress Notes (Signed)
Subjective:    Patient ID: Kenneth Pace, male    DOB: [birth year], 69 y.o.   MRN: [id number]  HPI Pt returns for f/u of diabetes mellitus:  DM type: 1 Dx'ed: 3557 Complications: DR and PN. Therapy: insulin since dx.  DKA: only at dx.   Severe hypoglycemia: most recently in 2014.  Pancreatitis: never Other: he has a medtronic 630 pump and continuous glucose monitor; he is retired. continuous glucose monitor ID is [email or web address] and password is [email or web address].  Interval history: he takes these pump settings: basal rate of 4.5 units/hr 6 AM-10 PM, and 2.6 units/hr 10 PM-6 AM mealtime bolus of 3 times a day (just before each meal) 4-4-8 units correction bolus (which some people call "sensitivity," or "insulin sensitivity ratio," or just "isr") of 1 unit for each 50 by which your glucose exceeds 100.   He does not have continuous glucose monitor sensors now.   No recent steroids.  He has the 670g pump, but has not yet started, due to not having sensors.  no cbg record, but states cbg's vary from 65-400.  It is in general highest in the afternoon.   Past Medical History:  Diagnosis Date  . ADENOIDECTOMY, HX OF 02/16/2007  . Anxiety    pt. states he does not have anxiety.  Marland Kitchen BENIGN PROSTATIC HYPERTROPHY, WITH OBSTRUCTION 02/03/2010  . 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 DIABETES MELLITUS, TYPE I, UNCONTROLLED 12/29/2006  . ERECTILE DYSFUNCTION 02/16/2007  . HYPERLIPIDEMIA 02/16/2007  . HYPERTENSION 02/16/2007  . Nerve pain    per patient "on lower back and both legs"  . Sleep apnea    uses CPAP  . TESTICULAR MASS, LEFT 02/16/2007    Past Surgical History:  Procedure Laterality Date  . ANTERIOR CERVICAL DECOMP/DISCECTOMY FUSION N/A 02/01/2017   Procedure: ANTERIOR CERVICAL DECOMPRESSION/DISCECTOMY FUSION CERVICAL THREE-FOUR , CERVICAL FOUR-FIVE  CERVICAL FIVE-SIX;  Surgeon: Consuella Lose, MD;  Location: [place];  Service:  Neurosurgery;  Laterality: N/A;  . APPENDECTOMY    . COLONOSCOPY  02/05/2010   [facility]  . KNEE SURGERY     2 Lknee arthroscopy, 3 R knee arthroscpoy  . KNEE SURGERY Right 11/12/2016  . LUMBAR LAMINECTOMY/DECOMPRESSION MICRODISCECTOMY Right 05/22/2019   Procedure: LAMINOTOMY AND MICRODISCECTOMY RIGHT LUMBAR FOUR- LUMBAR FIVE;  Surgeon: Consuella Lose, MD;  Location: [place];  Service: Neurosurgery;  Laterality: Right;  LAMINOTOMY AND MICRODISCECTOMY RIGHT LUMBAR FOUR- LUMBAR FIVE  . TONSILLECTOMY      Social History   Socioeconomic History  . Marital status: Married    Spouse name: Not on file  . Number of children: Not on file  . Years of education: Not on file  . Highest education level: Not on file  Occupational History  . Occupation: Lobbyist: [employer]    Comment: [employer]  Tobacco Use  . Smoking status: Never Smoker  . Smokeless tobacco: Never Used  Vaping Use  . Vaping Use: Never used  Substance and Sexual Activity  . Alcohol use: No  . Drug use: No  . Sexual activity: Not on file  Other Topics Concern  . Not on file  Social History Narrative   He works for [employer]   He lives at home with wife.     Highest level of education:  BS, business admin   Right handed   Two story home   Social Determinants of Health   Financial  Resource Strain:   . Difficulty of Paying Living Expenses:   Food Insecurity:   . Worried About Charity fundraiser in the Last Year:   . Arboriculturist in the Last Year:   Transportation Needs:   . Film/video editor (Medical):   Marland Kitchen Lack of Transportation (Non-Medical):   Physical Activity:   . Days of Exercise per Week:   . Minutes of Exercise per Session:   Stress:   . Feeling of Stress :   Social Connections:   . Frequency of Communication with Friends and Family:   . Frequency of Social Gatherings with Friends and Family:   . Attends Religious Services:   . Active  Member of Clubs or Organizations:   . Attends Archivist Meetings:   Marland Kitchen Marital Status:   Intimate Partner Violence:   . Fear of Current or Ex-Partner:   . Emotionally Abused:   Marland Kitchen Physically Abused:   . Sexually Abused:     Current Outpatient Medications on File Prior to Visit  Medication Sig Dispense Refill  . amitriptyline (ELAVIL) 75 MG tablet TAKE 1 TABLET(75 MG) BY MOUTH AT BEDTIME 90 tablet 1  . B Complex Vitamins (B-COMPLEX/B-12 PO) Take 1 tablet by mouth daily.     . baclofen (LIORESAL) 10 MG tablet Take 2 tablets (20 mg total) by mouth 3 (three) times daily. 540 tablet 3  . celecoxib (CELEBREX) 100 MG capsule TAKE 1 CAPSULE(100 MG) BY MOUTH AT BEDTIME 30 capsule 2  . clotrimazole-betamethasone (LOTRISONE) cream Apply 1 application topically 2 (two) times daily. 90 g 2  . Cranberry 400 MG TABS Take 400 mg by mouth daily.     . Cyanocobalamin (VITAMIN B-12 CR PO) Take 1 tablet by mouth daily.     . DULoxetine (CYMBALTA) 60 MG capsule TAKE 1 CAPSULE DAILY (Patient taking differently: Take 60 mg by mouth daily. ) 90 capsule 3  . finasteride (PROSCAR) 5 MG tablet Take 5 mg by mouth daily.     Marland Kitchen glucagon 1 MG injection Inject 1 mg into the vein once as needed. 1 each 12  . glucose blood (BAYER CONTOUR NEXT TEST) test strip MEDICALLY NECESSARY FOR USE WITH PUMP; Use to check blood sugar 5 times per day and prn; E11.42 500 each 3  . HYDROcodone-acetaminophen (NORCO/VICODIN) 5-325 MG tablet Take 1 tablet by mouth every 6 (six) hours as needed for moderate pain. 14 tablet 0  . Immune Globulin 10% (IMMUNE GLOBULIN 10%) 10G/137mL (10,000mg /173mL) SOLN Inject 105 g into the vein every 6 (six) weeks for 6 doses. 289.5 mL 0  . Immune Globulin 10% (IMMUNE GLOBULIN 10%) 10G/185mL (10,000mg /18mL) SOLN Inject 105 g into the vein every 6 (six) weeks. 289.5 mL 10  . Insulin Infusion Pump Supplies (PARADIGM RESERVOIR 3ML) MISC 1 Device by Does not apply route every 3 (three) days. 30 each 3    . Insulin Infusion Pump Supplies (QUICK-SET INFUSION 43" 9MM) MISC 1 Device by Does not apply route every 3 (three) days. 30 each 3  . insulin lispro (HUMALOG) 100 UNIT/ML injection INJECT 120 UNITS DAILY IN PUMP (Patient taking differently: Inject 120 Units into the skin continuous. Via Pump) 120 mL 3  . lidocaine (LIDODERM) 5 % At 7 am and remove at 7 pm. Apply 1 on each side. (Patient taking differently: Place 2 patches onto the skin daily as needed (pain). ) 180 patch 2  . lisinopril-hydrochlorothiazide (ZESTORETIC) 10-12.5 MG tablet TAKE 1 TABLET DAILY. PLEASE SEE PRIMARY  CARE PHYSICIAN FOR ANY FUTURE REFILLS 90 tablet 3  . methocarbamol (ROBAXIN-750) 750 MG tablet Take 1 tablet (750 mg total) by mouth 3 (three) times daily as needed for muscle spasms. 90 tablet 1  . Multiple Vitamin (MULTIVITAMIN) capsule Take 1 capsule by mouth daily.     Marland Kitchen scopolamine (TRANSDERM-SCOP) 1 MG/3DAYS Place 1 patch (1.5 mg total) onto the skin every 3 (three) days. (Patient taking differently: Place 1 patch onto the skin daily as needed (dizziness). ) 10 patch 12  . tiZANidine (ZANAFLEX) 2 MG tablet Take 1 tablet (2 mg total) by mouth every 8 (eight) hours as needed for muscle spasms. 30 tablet 1   No current facility-administered medications on file prior to visit.    No Known Allergies  Family History  Problem Relation Age of Onset  . Dementia Mother   . Diabetes Mellitus I Mother   . Hypertension Father        20  . Healthy Sister   . Rheum arthritis Brother   . Cancer Neg Hx   . Colon cancer Neg Hx   . Esophageal cancer Neg Hx   . Stomach cancer Neg Hx   . Rectal cancer Neg Hx   . Colon polyps Neg Hx     BP 120/76   Pulse 96   Ht 6' (1.829 m)   Wt (!) 238 lb 3.2 oz (108 kg)   SpO2 97%   BMI 32.31 kg/m    Review of Systems He denies LOC     Objective:   Physical Exam VITAL SIGNS:  See vs page GENERAL: no distress Pulses: dorsalis pedis intact bilat.   MSK: no deformity of the  feet CV: 2+ leg edema Skin:  no ulcer on the feet.  normal color and temp on the feet.   Neuro: sensation is intact to touch on the feet.   Ext: there is bilateral onychomycosis of the toenails.    Lab Results  Component Value Date   HGBA1C 8.1 (A) 09/08/2019       Assessment & Plan:  Type 1 DM, with PN. Hypoglycemia, due to insulin: this limits aggressiveness of glycemic control  Patient Instructions  Please take these pump settings: basal rate of 4.5 units/hr 6 AM-10 PM, and 2.6 units/hr 10 PM-6 AM mealtime bolus of 3 times a day (just before each meal) 4-6-8 units correction bolus (which some people call "sensitivity," or "insulin sensitivity ratio," or just "isr") of 1 unit for each 50 by which your glucose exceeds 100.  suspend the pump for 1-2 hrs, with activity.  If not, eat a light snack with it.   On this type of insulin schedule, you should eat meals on a regular schedule.  If a meal is missed or significantly delayed, your blood sugar could go low.  When you receive the sensors, please see Vaughan Basta to get started.  Please come back for a follow-up appointment 1-2 weeks after starting the new pump.

## 2019-09-10 DIAGNOSIS — E109 Type 1 diabetes mellitus without complications: Secondary | ICD-10-CM | POA: Diagnosis not present

## 2019-09-12 ENCOUNTER — Telehealth: Payer: Self-pay | Admitting: Nutrition

## 2019-09-12 NOTE — Telephone Encounter (Signed)
Patient reported that he had called [employer] to order the new sensors.  He has not gotten them yet, and will call me when they come in for training on this

## 2019-09-22 ENCOUNTER — Encounter: Payer: Self-pay | Admitting: Family Medicine

## 2019-09-22 ENCOUNTER — Other Ambulatory Visit: Payer: Self-pay

## 2019-09-22 ENCOUNTER — Ambulatory Visit (INDEPENDENT_AMBULATORY_CARE_PROVIDER_SITE_OTHER): Payer: BC Managed Care – PPO | Admitting: Family Medicine

## 2019-09-22 VITALS — BP 128/80 | HR 81 | Temp 98.2°F | Ht 72.0 in | Wt 243.4 lb

## 2019-09-22 DIAGNOSIS — I10 Essential (primary) hypertension: Secondary | ICD-10-CM

## 2019-09-22 DIAGNOSIS — M7989 Other specified soft tissue disorders: Secondary | ICD-10-CM

## 2019-09-22 NOTE — Patient Instructions (Addendum)
Keep the diet clean and stay active.  Because your blood pressure is well-controlled, you no longer have to check your blood pressure at home anymore unless you wish. Some people check it twice daily every day and some people stop altogether. Either or anything in between is fine. Strong work!  For the swelling in your lower extremities, be sure to elevate your legs when able, mind the salt intake, stay physically active and consider wearing compression stockings. Let me know if you aren't turning the corner in the next few months and we can send you to a specialist.   Let us know if you need anything.

## 2019-09-22 NOTE — Progress Notes (Signed)
Chief Complaint  Patient presents with  . Follow-up    Subjective Kenneth Pace is a 69 y.o. male who presents for hypertension follow up. He does sometimes monitor home blood pressures. Blood pressures ranging from 110-120's/70-80's on average. He is compliant with medications. Patient has these side effects of medication: none He is usually adhering to a healthy diet overall. Current exercise: walking  +LE swelling in both feet over past 8-12 mo. Diet is low in salt. Walks. Has OTC compression stockings.    Past Medical History:  Diagnosis Date  . ADENOIDECTOMY, HX OF 02/16/2007  . Anxiety    pt. states he does not have anxiety.  Marland Kitchen BENIGN PROSTATIC HYPERTROPHY, WITH OBSTRUCTION 02/03/2010  . 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 DIABETES MELLITUS, TYPE I, UNCONTROLLED 12/29/2006  . ERECTILE DYSFUNCTION 02/16/2007  . HYPERLIPIDEMIA 02/16/2007  . HYPERTENSION 02/16/2007  . Nerve pain    per patient "on lower back and both legs"  . Sleep apnea    uses CPAP  . TESTICULAR MASS, LEFT 02/16/2007    Exam BP 128/80 (BP Location: Left Arm, Patient Position: Sitting, Cuff Size: Normal)   Pulse 81   Temp 98.2 F (36.8 C) (Oral)   Ht 6' (1.829 m)   Wt 243 lb 6 oz (110.4 kg)   SpO2 98%   BMI 33.01 kg/m  General:  well developed, well nourished, in no apparent distress Heart: RRR, no bruits, 2+ b/l LE edema tapering at knees Lungs: clear to auscultation, no accessory muscle use Psych: well oriented with normal range of affect and appropriate judgment/insight  Essential hypertension  Localized swelling of both lower extremities  1. Cont care. Counseled on diet and exercise. 2. Elevate legs when able, mind the salt intake, stay physically active and rx for compression stockings given. F/u in 6 mo for CPE. The patient voiced understanding and agreement to the plan.  Hooks, DO 09/22/19  8:32 AM

## 2019-09-25 ENCOUNTER — Other Ambulatory Visit: Payer: Self-pay

## 2019-09-25 ENCOUNTER — Encounter
Payer: BC Managed Care – PPO | Attending: Physical Medicine & Rehabilitation | Admitting: Physical Medicine & Rehabilitation

## 2019-09-25 ENCOUNTER — Encounter: Payer: Self-pay | Admitting: Physical Medicine & Rehabilitation

## 2019-09-25 VITALS — BP 134/69 | HR 76 | Temp 98.1°F | Ht 72.0 in | Wt 241.0 lb

## 2019-09-25 DIAGNOSIS — M792 Neuralgia and neuritis, unspecified: Secondary | ICD-10-CM | POA: Insufficient documentation

## 2019-09-25 DIAGNOSIS — M791 Myalgia, unspecified site: Secondary | ICD-10-CM | POA: Insufficient documentation

## 2019-09-25 DIAGNOSIS — R269 Unspecified abnormalities of gait and mobility: Secondary | ICD-10-CM | POA: Diagnosis not present

## 2019-09-25 DIAGNOSIS — G6181 Chronic inflammatory demyelinating polyneuritis: Secondary | ICD-10-CM | POA: Diagnosis not present

## 2019-09-25 DIAGNOSIS — G959 Disease of spinal cord, unspecified: Secondary | ICD-10-CM | POA: Insufficient documentation

## 2019-09-25 DIAGNOSIS — M4712 Other spondylosis with myelopathy, cervical region: Secondary | ICD-10-CM | POA: Diagnosis not present

## 2019-09-25 NOTE — Progress Notes (Signed)
Subjective:    Patient ID: Kenneth Pace, male    DOB: [birth year], 70 y.o.   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   Last clinic visit on 06/05/2019.  Since that time, pt states hes doing HEP.  He is planning on retiring in December. He notes balance difficulty.  He is taking Elavil, Cymbalta, Baclofen.  He believes he is taking Celebrex. Had a fall blowing leaves with a backpack and bending up to pick up something.  Uses cane at times.  Gym is still closed. He completed therapies.  BP is controlled. He continues to take IVIG per Neurology.  Pain Inventory Average Pain 3 Pain Right Now 1 My pain is constant and dull  In the last 24 hours, has pain interfered with the following? General activity 1 Relation with others 0 Enjoyment of life 1 What TIME of day is your pain at its worst? evening Sleep?   Good Pain is worse with: bending Pain improves with: rest Relief from Meds: 7  Mobility walk without assistance walk with assistance use a cane ability to climb steps?  yes do you drive?  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  no  Physicians involved in your care Any changes since last visit?  yes   Family History  Problem Relation Age of Onset  . Dementia Mother   . Diabetes Mellitus I Mother   . Hypertension Father        4  . Healthy Sister   . Rheum arthritis Brother   . Cancer Neg Hx   . Colon cancer Neg Hx   . Esophageal cancer Neg Hx   . Stomach cancer Neg Hx   . Rectal cancer Neg Hx   . Colon polyps Neg Hx    Social History   Socioeconomic History  . Marital status: Married    Spouse name: Not on file  . Number of children:  Not on file  . Years of education: Not on file  . Highest education level: Not on file  Occupational History  . Occupation: Lobbyist: [employer]    Comment: [employer]  Tobacco Use  . Smoking status: Never Smoker  . Smokeless tobacco: Never Used  Vaping Use  . Vaping Use: Never used  Substance and Sexual Activity  . Alcohol use: No  . Drug use: No  . Sexual activity: Not on file  Other Topics Concern  . Not on file  Social History Narrative   He works for [employer] [place]   He lives at home with wife.     Highest level of education:  BS, business admin   Right handed   Two story home   Social Determinants of Health   Financial Resource Strain:   . Difficulty of Paying Living Expenses:   Food Insecurity:   . Worried About Charity fundraiser in the Last Year:   . Arboriculturist in the Last Year:   Transportation Needs:   . Film/video editor (Medical):   Marland Kitchen Lack of Transportation (Non-Medical):   Physical Activity:   . Days of Exercise per Week:   . Minutes  of Exercise per Session:   Stress:   . Feeling of Stress :   Social Connections:   . Frequency of Communication with Friends and Family:   . Frequency of Social Gatherings with Friends and Family:   . Attends Religious Services:   . Active Member of Clubs or Organizations:   . Attends Archivist Meetings:   Marland Kitchen Marital Status:    Past Surgical History:  Procedure Laterality Date  . ANTERIOR CERVICAL DECOMP/DISCECTOMY FUSION N/A 02/01/2017   Procedure: ANTERIOR CERVICAL DECOMPRESSION/DISCECTOMY FUSION CERVICAL THREE-FOUR , CERVICAL FOUR-FIVE  CERVICAL FIVE-SIX;  Surgeon: Consuella Lose, MD;  Location: [place];  Service: Neurosurgery;  Laterality: N/A;  . APPENDECTOMY    . COLONOSCOPY  02/05/2010   [facility]  . KNEE SURGERY     2 Lknee arthroscopy, 3 R knee arthroscpoy  . KNEE SURGERY Right 11/12/2016  . LUMBAR LAMINECTOMY/DECOMPRESSION  MICRODISCECTOMY Right 05/22/2019   Procedure: LAMINOTOMY AND MICRODISCECTOMY RIGHT LUMBAR FOUR- LUMBAR FIVE;  Surgeon: Consuella Lose, MD;  Location: [place];  Service: Neurosurgery;  Laterality: Right;  LAMINOTOMY AND MICRODISCECTOMY RIGHT LUMBAR FOUR- LUMBAR FIVE  . TONSILLECTOMY     Past Medical History:  Diagnosis Date  . ADENOIDECTOMY, HX OF 02/16/2007  . Anxiety    pt. states he does not have anxiety.  Marland Kitchen BENIGN PROSTATIC HYPERTROPHY, WITH OBSTRUCTION 02/03/2010  . 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 DIABETES MELLITUS, TYPE I, UNCONTROLLED 12/29/2006  . ERECTILE DYSFUNCTION 02/16/2007  . HYPERLIPIDEMIA 02/16/2007  . HYPERTENSION 02/16/2007  . Nerve pain    per patient "on lower back and both legs"  . Sleep apnea    uses CPAP  . TESTICULAR MASS, LEFT 02/16/2007   BP 134/69   Pulse 76   Temp 98.1 F (36.7 C)   Ht 6' (1.829 m)   Wt 241 lb (109.3 kg)   SpO2 96%   BMI 32.69 kg/m   Opioid Risk Score:   Fall Risk Score:  `1  Depression screen PHQ 2/9  Depression screen [facility] 2/9 12/10/2017 07/07/2017 02/26/2017 02/26/2017  Decreased Interest 0 0 0 0  Down, Depressed, Hopeless 0 0 0 0  PHQ - 2 Score 0 0 0 0  Altered sleeping - - 1 -  Tired, decreased energy - - 0 -  Change in appetite - - 0 -  Feeling bad or failure about yourself  - - 0 -  Trouble concentrating - - 0 -  Moving slowly or fidgety/restless - - 1 -  Suicidal thoughts - - 0 -  PHQ-9 Score - - 2 -  Difficult doing work/chores - - Not difficult at all -  Some recent data might be hidden   Review of Systems  Constitutional: Negative.   HENT: Negative.   Eyes: Negative.   Respiratory: Negative.   Cardiovascular: Positive for leg swelling.  Gastrointestinal: Negative.   Endocrine: Negative.   Genitourinary: Negative.   Musculoskeletal: Positive for arthralgias, back pain, joint swelling and myalgias.  Skin: Negative.   Allergic/Immunologic: Negative.     Neurological: Positive for dizziness, weakness and numbness.        Balance   Hematological: Negative.   Psychiatric/Behavioral: Negative.       Objective:   Physical Exam  Constitutional: NAD. Vital signs reviewed. HENT: Normocephalic. Atraumatic. Eyes: EOMI. No discharge. Cardiovascular: No JVD. Respiratory: Normal effort.  No stridor. GI: Non-distended. Skin: Warm and dry.  Intact.  Psych: Normal mood.  Normal behavior. 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   1.  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  Continue HEP  Cont follow up Neurology -IVIG per Neurology  Cont work - planning on retiring at the end of 2021  Will refer to PT for gait training   2.  Pain Management:   Gabapentin and Lyrica d/ced due to edema  No benefit with Mobic, Naproxen  Cont Lidoderm patch  Continue TENS prn  Continue Elavil to 75 qhs   Continue Cymbalta to 60mg  with food  Continue Baclofen per Neurology  Cont Tylenol  Continue Celebrex 100 qhs  3. Gait abnormality  Continue HEP  Less reliant on cane  See #1  4. Mechanical low back - multifactorial  Myalgia +/- facet arthropathy  MRI L-spine reviewed, multilevel predominantly foraminal and facet arthropathy  Cont Balcofen per Neurology  Cont Cymbalta   Note written for gym participation - discussed safety, but unable to go because it is still closed until 2022  Improved   5. Myalgia  Good benefit with TP injections, again on  3/9  See #1, #4  6. Spasticity  See #1,2,3  Continue Baclofen  Cont HEP  Will consider further intervention after therapies  7. Essential HTN  Follow up with PCP  8. Lumbar radiculopathy  S/p decompression right L4

## 2019-10-09 ENCOUNTER — Other Ambulatory Visit: Payer: Self-pay

## 2019-10-09 ENCOUNTER — Encounter: Payer: Self-pay | Admitting: Physical Therapy

## 2019-10-09 ENCOUNTER — Ambulatory Visit (INDEPENDENT_AMBULATORY_CARE_PROVIDER_SITE_OTHER): Payer: BC Managed Care – PPO | Admitting: Physical Therapy

## 2019-10-09 DIAGNOSIS — R2689 Other abnormalities of gait and mobility: Secondary | ICD-10-CM

## 2019-10-09 DIAGNOSIS — M6281 Muscle weakness (generalized): Secondary | ICD-10-CM | POA: Diagnosis not present

## 2019-10-09 DIAGNOSIS — R2681 Unsteadiness on feet: Secondary | ICD-10-CM | POA: Diagnosis not present

## 2019-10-09 NOTE — Therapy (Signed)
[address] [place], [place], [address] Phone: [phone]   Fax:  [phone]  Physical Therapy Evaluation  Patient Details  Name: Kenneth Pace MRN: [id number] Date of Birth: 1950/11/25 Referring Provider (PT): Delice Lesch, MD   Encounter Date: 10/09/2019   PT End of Session - 10/09/19 0849    Visit Number 1    Date for PT Re-Evaluation 12/04/19    Authorization Type BCBS    PT Start Time 0849    PT Stop Time 0933    PT Time Calculation (min) 44 min    Activity Tolerance Patient tolerated treatment well    Behavior During Therapy [facility] for tasks assessed/performed           Past Medical History:  Diagnosis Date  . ADENOIDECTOMY, HX OF 02/16/2007  . Anxiety    pt. states he does not have anxiety.  Marland Kitchen BENIGN PROSTATIC HYPERTROPHY, WITH OBSTRUCTION 02/03/2010  . 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 DIABETES MELLITUS, TYPE I, UNCONTROLLED 12/29/2006  . ERECTILE DYSFUNCTION 02/16/2007  . HYPERLIPIDEMIA 02/16/2007  . HYPERTENSION 02/16/2007  . Nerve pain    per patient "on lower back and both legs"  . Sleep apnea    uses CPAP  . TESTICULAR MASS, LEFT 02/16/2007    Past Surgical History:  Procedure Laterality Date  . ANTERIOR CERVICAL DECOMP/DISCECTOMY FUSION N/A 02/01/2017   Procedure: ANTERIOR CERVICAL DECOMPRESSION/DISCECTOMY FUSION CERVICAL THREE-FOUR , CERVICAL FOUR-FIVE  CERVICAL FIVE-SIX;  Surgeon: Consuella Lose, MD;  Location: [place];  Service: Neurosurgery;  Laterality: N/A;  . APPENDECTOMY    . COLONOSCOPY  02/05/2010   [facility]  . KNEE SURGERY     2 Lknee arthroscopy, 3 R knee arthroscpoy  . KNEE SURGERY Right 11/12/2016  . LUMBAR LAMINECTOMY/DECOMPRESSION MICRODISCECTOMY Right 05/22/2019   Procedure: LAMINOTOMY AND MICRODISCECTOMY RIGHT LUMBAR FOUR- LUMBAR FIVE;  Surgeon: Consuella Lose, MD;  Location: [place];  Service:  Neurosurgery;  Laterality: Right;  LAMINOTOMY AND MICRODISCECTOMY RIGHT LUMBAR FOUR- LUMBAR FIVE  . TONSILLECTOMY      There were no vitals filed for this visit.    Subjective Assessment - 10/09/19 0858    Subjective Patient with h/o GB from October 2018. Patient reports 5 falls in past 6 months.    Pertinent History CIDP, ACDF, lumbar discectomy, bil knee scopes, Paget's disease    Patient Stated Goals improve gait    Currently in Pain? No/denies              [facility] PT Assessment - 10/09/19 0001      Assessment   Medical Diagnosis CIDP, neurologic gait disorder    Referring Provider (PT) Delice Lesch, MD    Onset Date/Surgical Date 11/16/16    Hand Dominance Right    Prior Therapy yes      Precautions   Precautions Fall      Restrictions   Weight Bearing Restrictions No      Balance Screen   Has the patient fallen in the past 6 months Yes   moring is foggy   How many times? 5   2 weeks ago; blowing leave, bent forward and fell   Has the patient had a decrease in activity level because of a fear of falling?  No    Is the patient reluctant to leave their home because of a fear of falling?  No      Home Ecologist residence  Living Arrangements Spouse/significant other    Available Help at Discharge Family    Type of [place] to enter    Entrance Stairs-Number of Steps 2    Entrance Stairs-Rails None    Home Layout Two level;Bed/bath upstairs    Alternate Level Stairs-Number of Steps 17    Alternate Level Stairs-Rails Can reach both;Right;Left    [place] - single point;Walker - 2 wheels;Walker - 4 wheels    Additional Comments No cane use indoors, uses walls/furniture for balance.       Prior Function   Level of Independence Independent    Vocation Full time employment   retiring December 2021   Vocation Requirements audit [employer]    Leisure yard work, builds cars      ROM / Strength    AROM / PROM / Strength AROM;Strength      Strength   Overall Strength Comments Bil hip flex 4/5, Rt ext 4-/5, Lt 4/5, Rt hip ABD 4-/5, 4/5, knee ext/flex 4+/5, bil DF 5/5, able to squat to ground and up with one UE assist      Flexibility   Soft Tissue Assessment /Muscle Length yes    Hamstrings marked bil    Piriformis mild left       Ambulation/Gait   Ambulation Distance (Feet) 40 Feet    Assistive device Straight cane    Gait Pattern Decreased hip/knee flexion - right;Right circumduction    Ambulation Surface Level    Gait velocity 2.54 ft/sec      Balance   Balance Assessed Yes   LOB with standing pertebations     Standardized Balance Assessment   Standardized Balance Assessment Berg Balance Test;Five Times Sit to Stand    Five times sit to stand comments  13.5 sec      Berg Balance Test   Sit to Stand Able to stand without using hands and stabilize independently    Standing Unsupported Able to stand safely 2 minutes    Sitting with Back Unsupported but Feet Supported on Floor or Stool Able to sit safely and securely 2 minutes    Stand to Sit Sits safely with minimal use of hands    Transfers Able to transfer safely, minor use of hands    Standing Unsupported with Eyes Closed Able to stand 10 seconds safely    Standing Unsupported with Feet Together Able to place feet together independently and stand 1 minute safely    From Standing, Reach Forward with Outstretched Arm Can reach confidently >25 cm (10")    From Standing Position, Pick up Object from Floor Able to pick up shoe safely and easily    From Standing Position, Turn to Look Behind Over each Shoulder Looks behind from both sides and weight shifts well    Turn 360 Degrees Able to turn 360 degrees safely but slowly    Standing Unsupported, Alternately Place Feet on Step/Stool Able to stand independently and safely and complete 8 steps in 20 seconds   unsteady on first step then improves   Standing Unsupported, One Foot  in Front Able to place foot tandem independently and hold 30 seconds   very unsteady   Standing on One Leg Able to lift leg independently and hold 5-10 seconds   right only 3 sec; left 7 sec   Total Score 53  Objective measurements completed on examination: See above findings.               PT Education - 10/09/19 1152    Education Details HEP    Person(s) Educated Patient    Methods Explanation;Demonstration;Handout    Comprehension Verbalized understanding;Returned demonstration               PT Long Term Goals - 10/09/19 1209      PT LONG TERM GOAL #1   Title Patient independent with HEP for balance and core strength    Time 8    Period Weeks    Status New    Target Date 12/04/19      PT LONG TERM GOAL #2   Title Improved BERG score to 55/56    Baseline 53/56    Time 8    Period Weeks    Status New      PT LONG TERM GOAL #3   Title Improved 5x sit to stand to <= 12 seconds    Time 8    Period Weeks    Status New      PT LONG TERM GOAL #4   Title Patient to demo improved right knee and hip flexion during swing phase of gait.    Baseline ----    Time 8    Period Weeks    Status New      PT LONG TERM GOAL #5   Title -----------                  Plan - 10/09/19 1152    Clinical Impression Statement Patient is a 69 yr old male familar to this clinic. He continues to experience falls and have gait abnormalities due to CIDP beginning in 2018 with Guillain-Barre and cervical myelopathy. He ambulates with a SPC in the community and uses furniture for stability at home. He reports 5 falls in the past 6 months, the last being 2 weeks ago when he fell forward while leaning forward with a leaf blower on his back. He has deficits with balance in standing with pertebations and with higher level activites such as SLS and tandem stance. His BERG is 53/56; 5x sit to stand 13.5 sec and his gait velocity is 2.54 ft/sec. He  also has BLE weakness and tightness in his hips and knees. He will benefit from PT to address these deficits and decrease his fall risk.    Personal Factors and Comorbidities Past/Current Experience;Comorbidity 3+    Comorbidities CIDP, ACDF, lumbar discectomy, bil knee scopes, Paget's disease    Stability/Clinical Decision Making Evolving/Moderate complexity    Clinical Decision Making Low    Rehab Potential Good    PT Frequency 2x / week    PT Duration 8 weeks    PT Treatment/Interventions ADLs/Self Care Home Management;Aquatic Therapy;Gait training;Therapeutic activities;Therapeutic exercise;Balance training;Neuromuscular re-education;Manual techniques;Patient/family education    PT Next Visit Plan higher level balance activities, core strength, gait    PT Home Exercise Plan [email or web address]    Consulted and Agree with Plan of Care Patient           Patient will benefit from skilled therapeutic intervention in order to improve the following deficits and impairments:  Abnormal gait, Difficulty walking, Impaired flexibility, Decreased strength, Decreased balance  Visit Diagnosis: Unsteadiness on feet - Plan: PT plan of care cert/re-cert  Muscle weakness (generalized) - Plan: PT plan of care cert/re-cert  Other abnormalities of gait and mobility - Plan: PT plan of care  cert/re-cert     Problem List Patient Active Problem List   Diagnosis Date Noted  . Lumbar radiculopathy 05/22/2019  . Spastic gait 03/07/2019  . Myalgia 11/01/2018  . Chronic bilateral low back pain without sciatica 07/21/2018  . 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 CIDP (chronic inflammatory demyelinating polyneuropathy) ([place]) 12/30/2017  . Edema 03/01/2017  . Hypokalemia   . Slow transit constipation   . Steroid-induced hyperglycemia   . Urinary retention   . Accidental drug overdose   . Postoperative pain   . Hyponatremia   . Cervical myelopathy ([place]) 02/01/2017  . Shortness of breath at rest   .  Hypoglycemia   . Spondylosis, cervical, with myelopathy   . Neuropathic pain   . Benign essential HTN   . Labile blood glucose   . Muscle spasm   . GAD (generalized anxiety disorder)   . Acute inflammatory demyelinating polyneuropathy ([place]) 01/11/2017  . Anxiety state   . Neurogenic bladder   . Depression 12/30/2016  . Leg weakness, bilateral 12/30/2016  . 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[place]) 12/30/2016  . GBS (Guillain Barre syndrome) ([place])   . AIDP (acute inflammatory demyelinating polyneuropathy) ([place]) 11/27/2016  . Weakness 11/20/2016  . Weakness of both arms 10/30/2016  . 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 Obstructive sleep apnea 03/24/2016  . Numbness 03/18/2016  . Mild nonproliferative diabetic retinopathy of both eyes without macular edema associated with type 2 diabetes mellitus ([place]) 05/24/2015  . Nuclear cataract of both eyes 05/24/2015  . Diabetes mellitus without complication ([place]) 43/32/9518  . 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 Eustachian tube dysfunction 02/12/2015  . Acute maxillary sinusitis 02/12/2015  . Wellness examination 08/22/2014  . Alkaline phosphatase elevation 08/22/2014  . 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 Benign non-nodular prostatic hyperplasia with lower urinary tract symptoms 01/31/2014  . Lesion of eyelid 09/26/2013  . Screening for prostate cancer 04/24/2013  . Diabetes ([place]) 06/16/2012  . ED (erectile dysfunction) of organic origin 06/03/2012  . Routine general medical examination at a health care facility 04/10/2012  . BPH (benign prostatic hyperplasia) 02/03/2010  . HEMOCCULT POSITIVE STOOL 05/25/2008  . ELBOW PAIN, RIGHT 07/04/2007  . Dyslipidemia 02/16/2007  . ANXIETY 02/16/2007  . ERECTILE DYSFUNCTION 02/16/2007  . Essential hypertension 02/16/2007  . TESTICULAR MASS, LEFT 02/16/2007  . KNEE PAIN, CHRONIC 02/16/2007  . ADENOIDECTOMY, HX OF 02/16/2007    Madelyn Flavors PT 10/09/2019, 12:24 PM  [facility] [address] [place], [place], [address] Phone: [phone]   Fax:  [phone]  Name: Kenneth Pace MRN: [id number] Date of Birth: 08-25-1950

## 2019-10-09 NOTE — Patient Instructions (Signed)
Access Code: [email or web address] URL: [email or web address] Date: 10/09/2019 Prepared by: Almyra Free  Exercises Single Leg Stance - 1 x daily - 7 x weekly - 1 sets - 10 reps Tandem Stance - 1 x daily - 7 x weekly - 1 sets - 10 reps Standing March - 1 x daily - 7 x weekly - 2 sets - 10 reps Sit to Stand - 2 x daily - 7 x weekly - 1 sets - 10 reps

## 2019-10-12 ENCOUNTER — Encounter: Payer: Self-pay | Admitting: Physical Therapy

## 2019-10-12 ENCOUNTER — Other Ambulatory Visit: Payer: Self-pay

## 2019-10-12 ENCOUNTER — Ambulatory Visit (INDEPENDENT_AMBULATORY_CARE_PROVIDER_SITE_OTHER): Payer: BC Managed Care – PPO | Admitting: Physical Therapy

## 2019-10-12 DIAGNOSIS — R2681 Unsteadiness on feet: Secondary | ICD-10-CM | POA: Diagnosis not present

## 2019-10-12 DIAGNOSIS — M6281 Muscle weakness (generalized): Secondary | ICD-10-CM

## 2019-10-12 DIAGNOSIS — R2689 Other abnormalities of gait and mobility: Secondary | ICD-10-CM | POA: Diagnosis not present

## 2019-10-12 NOTE — Therapy (Signed)
[place], [place], [address] Phone: [phone]   Fax:  [phone]  Physical Therapy Treatment  Patient Details  Name: ROREY BISSON MRN: [id number] Date of Birth: [birth year] Referring Provider (PT): Delice Lesch, MD   Encounter Date: 10/12/2019   PT End of Session - 10/12/19 0938    Visit Number 2    Date for PT Re-Evaluation 12/04/19    Authorization Type BCBS    PT Start Time [id number]    PT Stop Time 1017    PT Time Calculation (min) 39 min    Activity Tolerance Patient tolerated treatment well    Behavior During Therapy [facility] for tasks assessed/performed           Past Medical History:  Diagnosis Date   ADENOIDECTOMY, HX OF 02/16/2007   Anxiety    pt. states he does not have anxiety.   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[place])    diagnosed 11/2016   DIABETES MELLITUS, TYPE I, UNCONTROLLED 12/29/2006   ERECTILE DYSFUNCTION 02/16/2007   HYPERLIPIDEMIA 02/16/2007   HYPERTENSION 02/16/2007   Nerve pain    per patient "on lower back and both legs"   Sleep apnea    uses CPAP   TESTICULAR MASS, LEFT 02/16/2007    Past Surgical History:  Procedure Laterality Date   ANTERIOR CERVICAL DECOMP/DISCECTOMY FUSION N/A 02/01/2017   Procedure: ANTERIOR CERVICAL DECOMPRESSION/DISCECTOMY FUSION CERVICAL THREE-FOUR , CERVICAL FOUR-FIVE  CERVICAL FIVE-SIX;  Surgeon: Consuella Lose, MD;  Location: [place];  Service: Neurosurgery;  Laterality: N/A;   APPENDECTOMY     COLONOSCOPY  02/05/2010   [facility]   KNEE SURGERY     2 Lknee arthroscopy, 3 R knee arthroscpoy   KNEE SURGERY Right 11/12/2016   LUMBAR LAMINECTOMY/DECOMPRESSION MICRODISCECTOMY Right 05/22/2019   Procedure: LAMINOTOMY AND MICRODISCECTOMY RIGHT LUMBAR FOUR- LUMBAR FIVE;  Surgeon: Consuella Lose, MD;  Location: [place];  Service:  Neurosurgery;  Laterality: Right;  LAMINOTOMY AND MICRODISCECTOMY RIGHT LUMBAR FOUR- LUMBAR FIVE   TONSILLECTOMY      There were no vitals filed for this visit.   Subjective Assessment - 10/12/19 5732    Subjective Patient with h/o GB from October 2018. Patient reports 5 falls in past 6 months.    Pertinent History CIDP, ACDF, lumbar discectomy, bil knee scopes, Paget's disease    Patient Stated Goals improve gait    Currently in Pain? No/denies                             [facility] Adult PT Treatment/Exercise - 10/12/19 0001      Exercises   Exercises Knee/Hip      Knee/Hip Exercises: Stretches   Active Hamstring Stretch Both;2 reps;30 seconds    Piriformis Stretch Both;2 reps;30 seconds    Piriformis Stretch Limitations seated pulling knee toward chest      Knee/Hip Exercises: Aerobic   Elliptical L1 x 1 min   difficult   Nustep L5 x  4 min               Balance Exercises - 10/12/19 0001      Balance Exercises: Standing   Tandem Stance Eyes open;2 reps;30 secs    Stepping Strategy Anterior;10 reps   step/toss 10 with weighted red ball also   Step Ups Forward;2 inch   foam; x 10 bil   Tandem Gait Forward;4 reps  2 reps with semitandem gait   Retro Gait Theraband   one lap; cues not to lean back   Theraband Level (Retro Gait) --   pink xts   Marching Solid surface;Forwards;10 reps    Other Standing Exercises resisted walking forward with pink xts x 2 laps                  PT Long Term Goals - 10/09/19 1209      PT LONG TERM GOAL #1   Title Patient independent with HEP for balance and core strength    Time 8    Period Weeks    Status New    Target Date 12/04/19      PT LONG TERM GOAL #2   Title Improved BERG score to 55/56    Baseline 53/56    Time 8    Period Weeks    Status New      PT LONG TERM GOAL #3   Title Improved 5x sit to stand to <= 12 seconds    Time 8    Period Weeks    Status New      PT LONG TERM GOAL #4    Title Patient to demo improved right knee and hip flexion during swing phase of gait.    Baseline ----    Time 8    Period Weeks    Status New      PT LONG TERM GOAL #5   Title -----------                 Plan - 10/12/19 1040    Clinical Impression Statement Patient did well with balance TE today. He liked the resisted walking due to sensory feedback. Elliptical was difficult and he would benefit from more of this.    Comorbidities CIDP, ACDF, lumbar discectomy, bil knee scopes, Paget's disease    PT Treatment/Interventions ADLs/Self Care Home Management;Aquatic Therapy;Gait training;Therapeutic activities;Therapeutic exercise;Balance training;Neuromuscular re-education;Manual techniques;Patient/family education    PT Next Visit Plan higher level balance activities, core strength, gait, resisted trunk extension    PT Home Exercise Plan [phone]           Patient will benefit from skilled therapeutic intervention in order to improve the following deficits and impairments:  Abnormal gait, Difficulty walking, Impaired flexibility, Decreased strength, Decreased balance  Visit Diagnosis: Muscle weakness (generalized)  Unsteadiness on feet  Other abnormalities of gait and mobility     Problem List Patient Active Problem List   Diagnosis Date Noted   Lumbar radiculopathy 05/22/2019   Spastic gait 03/07/2019   Myalgia 11/01/2018   Chronic bilateral low back pain without sciatica 07/21/2018   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   CIDP (chronic inflammatory demyelinating polyneuropathy) ([place]) 12/30/2017   Edema 03/01/2017   Hypokalemia    Slow transit constipation    Steroid-induced hyperglycemia    Urinary retention    Accidental drug overdose    Postoperative pain    Hyponatremia    Cervical myelopathy ([facility]) 02/01/2017   Shortness of breath at rest    Hypoglycemia    Spondylosis, cervical, with myelopathy    Neuropathic pain    Benign  essential HTN    Labile blood glucose    Muscle spasm    GAD (generalized anxiety disorder)    Acute inflammatory demyelinating polyneuropathy ([place]) 01/11/2017   Anxiety state    Neurogenic bladder    Depression 12/30/2016   Leg weakness, bilateral 12/30/2016   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[place])  12/30/2016   GBS (Guillain Barre syndrome) ([facility])    AIDP (acute inflammatory demyelinating polyneuropathy) ([place]) 11/27/2016   Weakness 11/20/2016   Weakness of both arms 10/30/2016   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   Obstructive sleep apnea 03/24/2016   Numbness 03/18/2016   Mild nonproliferative diabetic retinopathy of both eyes without macular edema associated with type 2 diabetes mellitus ([place]) 05/24/2015   Nuclear cataract of both eyes 05/24/2015   Diabetes mellitus without complication ([place]) 37/29/0211   Chronic prostatitis 03/01/2015   Eustachian tube dysfunction 02/12/2015   Acute maxillary sinusitis 02/12/2015   Wellness examination 08/22/2014   Alkaline phosphatase elevation 08/22/2014   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   Benign non-nodular prostatic hyperplasia with lower urinary tract symptoms 01/31/2014   Lesion of eyelid 09/26/2013   Screening for prostate cancer 04/24/2013   Diabetes ([place]) 06/16/2012   ED (erectile dysfunction) of organic origin 06/03/2012   Routine general medical examination at a health care facility 04/10/2012   BPH (benign prostatic hyperplasia) 02/03/2010   HEMOCCULT POSITIVE STOOL 05/25/2008   ELBOW PAIN, RIGHT 07/04/2007   Dyslipidemia 02/16/2007   ANXIETY 02/16/2007   ERECTILE DYSFUNCTION 02/16/2007   Essential hypertension 02/16/2007   TESTICULAR MASS, LEFT 02/16/2007   KNEE PAIN, CHRONIC 02/16/2007   ADENOIDECTOMY, HX OF 02/16/2007    Madelyn Flavors PT 10/12/2019, 10:42 AM  [facility] [place] [address] [place], [place], [address] Phone: [phone]   Fax:  [phone]  Name: RAYNER ERMAN MRN: [id number] Date of Birth: [birth year]

## 2019-10-13 ENCOUNTER — Inpatient Hospital Stay (HOSPITAL_COMMUNITY): Admission: RE | Admit: 2019-10-13 | Payer: BC Managed Care – PPO | Source: Ambulatory Visit

## 2019-10-13 ENCOUNTER — Ambulatory Visit (HOSPITAL_COMMUNITY)
Admission: RE | Admit: 2019-10-13 | Discharge: 2019-10-13 | Disposition: A | Discharge status: Home / Self Care | Payer: BC Managed Care – PPO | Source: Ambulatory Visit | Attending: Neurology | Admitting: Neurology

## 2019-10-13 DIAGNOSIS — G6181 Chronic inflammatory demyelinating polyneuritis: Secondary | ICD-10-CM | POA: Insufficient documentation

## 2019-10-13 MED ORDER — IMMUNE GLOBULIN (HUMAN) 5 GM/50ML IV SOLN
105.0000 g | Freq: Once | INTRAVENOUS | Status: AC
  Administered 2019-10-13: 105 g via INTRAVENOUS
  Filled 2019-10-13: qty 1000

## 2019-10-16 ENCOUNTER — Ambulatory Visit (INDEPENDENT_AMBULATORY_CARE_PROVIDER_SITE_OTHER): Payer: BC Managed Care – PPO | Admitting: Physical Therapy

## 2019-10-16 ENCOUNTER — Encounter: Payer: Self-pay | Admitting: Physical Therapy

## 2019-10-16 ENCOUNTER — Other Ambulatory Visit: Payer: Self-pay

## 2019-10-16 DIAGNOSIS — R2681 Unsteadiness on feet: Secondary | ICD-10-CM | POA: Diagnosis not present

## 2019-10-16 DIAGNOSIS — R2689 Other abnormalities of gait and mobility: Secondary | ICD-10-CM | POA: Diagnosis not present

## 2019-10-16 DIAGNOSIS — M6281 Muscle weakness (generalized): Secondary | ICD-10-CM

## 2019-10-16 NOTE — Therapy (Signed)
[place], [place], [address] Phone: [phone]   Fax:  [phone]  Physical Therapy Treatment  Patient Details  Name: Kenneth Pace MRN: [id number] Date of Birth: 05-May-1950 Referring Provider (PT): Delice Lesch, MD   Encounter Date: 10/16/2019   PT End of Session - 10/16/19 0808    Visit Number 3    Date for PT Re-Evaluation 12/04/19    Authorization Type BCBS    PT Start Time 0800    PT Stop Time 0838    PT Time Calculation (min) 38 min    Activity Tolerance Patient tolerated treatment well    Behavior During Therapy [facility] for tasks assessed/performed           Past Medical History:  Diagnosis Date  . ADENOIDECTOMY, HX OF 02/16/2007  . Anxiety    pt. states he does not have anxiety.  Marland Kitchen BENIGN PROSTATIC HYPERTROPHY, WITH OBSTRUCTION 02/03/2010  . 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 DIABETES MELLITUS, TYPE I, UNCONTROLLED 12/29/2006  . ERECTILE DYSFUNCTION 02/16/2007  . HYPERLIPIDEMIA 02/16/2007  . HYPERTENSION 02/16/2007  . Nerve pain    per patient "on lower back and both legs"  . Sleep apnea    uses CPAP  . TESTICULAR MASS, LEFT 02/16/2007    Past Surgical History:  Procedure Laterality Date  . ANTERIOR CERVICAL DECOMP/DISCECTOMY FUSION N/A 02/01/2017   Procedure: ANTERIOR CERVICAL DECOMPRESSION/DISCECTOMY FUSION CERVICAL THREE-FOUR , CERVICAL FOUR-FIVE  CERVICAL FIVE-SIX;  Surgeon: Consuella Lose, MD;  Location: [place];  Service: Neurosurgery;  Laterality: N/A;  . APPENDECTOMY    . COLONOSCOPY  02/05/2010   [facility]  . KNEE SURGERY     2 Lknee arthroscopy, 3 R knee arthroscpoy  . KNEE SURGERY Right 11/12/2016  . LUMBAR LAMINECTOMY/DECOMPRESSION MICRODISCECTOMY Right 05/22/2019   Procedure: LAMINOTOMY AND MICRODISCECTOMY RIGHT LUMBAR FOUR- LUMBAR FIVE;  Surgeon: Consuella Lose, MD;  Location: [place];  Service:  Neurosurgery;  Laterality: Right;  LAMINOTOMY AND MICRODISCECTOMY RIGHT LUMBAR FOUR- LUMBAR FIVE  . TONSILLECTOMY      There were no vitals filed for this visit.   Subjective Assessment - 10/16/19 0809    Subjective Pt reports he was walking in yard in dark and tripped and fell to his knees.  This morning he is "having trouble picking up feet today".    Pertinent History CIDP, ACDF, lumbar discectomy, bil knee scopes, Paget's disease    Currently in Pain? Yes    Pain Score 2     Pain Location Generalized    Pain Descriptors / Indicators Dull    Pain Onset --   baseline pain             [facility] PT Assessment - 10/16/19 0001      Assessment   Medical Diagnosis CIDP, neurologic gait disorder    Referring Provider (PT) Delice Lesch, MD    Onset Date/Surgical Date 11/16/16    Hand Dominance Right    Prior Therapy yes            [facility] Adult PT Treatment/Exercise - 10/16/19 0001      Knee/Hip Exercises: Stretches   Passive Hamstring Stretch Right;Left;2 reps;20 seconds    Hip Flexor Stretch Right;Left;2 reps;30 seconds   seated with leg back   Hip Flexor Stretch Limitations trial of standing Rt hip flexor stretch x 30 sec, cues for form.     Other Knee/Hip Stretches standing adductor stretch x 15 sec x  3 reps each side (side lunge)      Knee/Hip Exercises: Aerobic   Elliptical L1 x 1 min   difficult   Nustep L5 x  6 min           Balance Exercises - 10/16/19 0001      Balance Exercises: Standing   Standing Eyes Opened Narrow base of support (BOS);Foam/compliant surface;3 reps;10 secs - repeated with lateral and ant perturbations    Standing Eyes Closed Narrow base of support (BOS);Wide (BOA);Foam/compliant surface;3 reps;10 secs    Tandem Stance Eyes open;Intermittent upper extremity support;Foam/compliant surface;2 reps;20 secs   mini trampoline   Wall Bumps Hip;Eyes opened;10 reps    Stepping Strategy Anterior;Lateral;Foam/compliant surface;5 reps - pausing each rep with  2-3sec SLS   Marching Foam/compliant surface;Intermittent upper extremity assist;Head turns   on mini trampoline                 PT Long Term Goals - 10/09/19 1209      PT LONG TERM GOAL #1   Title Patient independent with HEP for balance and core strength    Time 8    Period Weeks    Status New    Target Date 12/04/19      PT LONG TERM GOAL #2   Title Improved BERG score to 55/56    Baseline 53/56    Time 8    Period Weeks    Status New      PT LONG TERM GOAL #3   Title Improved 5x sit to stand to <= 12 seconds    Time 8    Period Weeks    Status New      PT LONG TERM GOAL #4   Title Patient to demo improved right knee and hip flexion during swing phase of gait.    Baseline ----    Time 8    Period Weeks    Status New      PT LONG TERM GOAL #5   Title -----------                 Plan - 10/16/19 3500    Clinical Impression Statement Pt demonstrated decreased balance with exercises on compliant surface with challenges of perterbations, head turns, eyes closed, and narrow base of support (not all at same time).  He continues with tightness in Rt hip flexors and unstead gait in between exercises. Pt reports he is not exercising during week; encouragment given for stretches of hips multiple times during work day to decrease stiffness. Goals are ongoing.    Comorbidities CIDP, ACDF, lumbar discectomy, bil knee scopes, Paget's disease    PT Treatment/Interventions ADLs/Self Care Home Management;Aquatic Therapy;Gait training;Therapeutic activities;Therapeutic exercise;Balance training;Neuromuscular re-education;Manual techniques;Patient/family education    PT Next Visit Plan higher level balance activities, core strength, gait, resisted trunk extension    PT Home Exercise Plan [phone]           Patient will benefit from skilled therapeutic intervention in order to improve the following deficits and impairments:  Abnormal gait, Difficulty walking, Impaired  flexibility, Decreased strength, Decreased balance  Visit Diagnosis: Muscle weakness (generalized)  Unsteadiness on feet  Other abnormalities of gait and mobility     Problem List Patient Active Problem List   Diagnosis Date Noted  . Lumbar radiculopathy 05/22/2019  . Spastic gait 03/07/2019  . Myalgia 11/01/2018  . Chronic bilateral low back pain without sciatica 07/21/2018  . 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 CIDP (chronic inflammatory demyelinating  polyneuropathy) ([place]) 12/30/2017  . Edema 03/01/2017  . Hypokalemia   . Slow transit constipation   . Steroid-induced hyperglycemia   . Urinary retention   . Accidental drug overdose   . Postoperative pain   . Hyponatremia   . Cervical myelopathy ([place]) 02/01/2017  . Shortness of breath at rest   . Hypoglycemia   . Spondylosis, cervical, with myelopathy   . Neuropathic pain   . Benign essential HTN   . Labile blood glucose   . Muscle spasm   . GAD (generalized anxiety disorder)   . Acute inflammatory demyelinating polyneuropathy ([place]) 01/11/2017  . Anxiety state   . Neurogenic bladder   . Depression 12/30/2016  . Leg weakness, bilateral 12/30/2016  . 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 GBS (Guillain Barre syndrome) ([place])   . AIDP (acute inflammatory demyelinating polyneuropathy) ([place]) 11/27/2016  . Weakness 11/20/2016  . Weakness of both arms 10/30/2016  . 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 Obstructive sleep apnea 03/24/2016  . Numbness 03/18/2016  . Mild nonproliferative diabetic retinopathy of both eyes without macular edema associated with type 2 diabetes mellitus ([place]) 05/24/2015  . Nuclear cataract of both eyes 05/24/2015  . Diabetes mellitus without complication ([place]) 78/58/8502  . 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 Eustachian tube dysfunction 02/12/2015  . Acute maxillary sinusitis 02/12/2015  . Wellness examination 08/22/2014  . Alkaline  phosphatase elevation 08/22/2014  . 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 Benign non-nodular prostatic hyperplasia with lower urinary tract symptoms 01/31/2014  . Lesion of eyelid 09/26/2013  . Screening for prostate cancer 04/24/2013  . Diabetes ([place]) 06/16/2012  . ED (erectile dysfunction) of organic origin 06/03/2012  . Routine general medical examination at a health care facility 04/10/2012  . BPH (benign prostatic hyperplasia) 02/03/2010  . HEMOCCULT POSITIVE STOOL 05/25/2008  . ELBOW PAIN, RIGHT 07/04/2007  . Dyslipidemia 02/16/2007  . ANXIETY 02/16/2007  . ERECTILE DYSFUNCTION 02/16/2007  . Essential hypertension 02/16/2007  . TESTICULAR MASS, LEFT 02/16/2007  . KNEE PAIN, CHRONIC 02/16/2007  . Michell Heinrich OF 02/16/2007   Kerin Perna, PTA 10/16/19 8:49 [place], [place], [address] Phone: [phone]   Fax:  [phone]  Name: BERTHEL BAGNALL MRN: [id number] Date of Birth: 1951/01/08

## 2019-10-19 ENCOUNTER — Other Ambulatory Visit: Payer: Self-pay

## 2019-10-19 ENCOUNTER — Ambulatory Visit (INDEPENDENT_AMBULATORY_CARE_PROVIDER_SITE_OTHER): Payer: BC Managed Care – PPO | Admitting: Physical Therapy

## 2019-10-19 DIAGNOSIS — M6281 Muscle weakness (generalized): Secondary | ICD-10-CM

## 2019-10-19 DIAGNOSIS — R2689 Other abnormalities of gait and mobility: Secondary | ICD-10-CM | POA: Diagnosis not present

## 2019-10-19 DIAGNOSIS — R2681 Unsteadiness on feet: Secondary | ICD-10-CM | POA: Diagnosis not present

## 2019-10-19 NOTE — Therapy (Signed)
[place], [place], [address] Phone: [phone]   Fax:  [phone]  Physical Therapy Treatment  Patient Details  Name: [facility] P[facility] MRN: [id number] Date of Birth: Dec 01, 1950 Referring Provider (PT): Delice Lesch, MD   Encounter Date: 10/19/2019   PT End of Session - 10/19/19 0810    Visit Number 4    Date for PT Re-Evaluation 12/04/19    Authorization Type BCBS    PT Start Time 0802    PT Stop Time 0841    PT Time Calculation (min) 39 min    Activity Tolerance Patient tolerated treatment well    Behavior During Therapy [facility] for tasks assessed/performed           Past Medical History:  Diagnosis Date  . ADENOIDECTOMY, HX OF 02/16/2007  . Anxiety    pt. states he does not have anxiety.  Marland Kitchen BENIGN PROSTATIC HYPERTROPHY, WITH OBSTRUCTION 02/03/2010  . 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 DIABETES MELLITUS, TYPE I, UNCONTROLLED 12/29/2006  . ERECTILE DYSFUNCTION 02/16/2007  . HYPERLIPIDEMIA 02/16/2007  . HYPERTENSION 02/16/2007  . Nerve pain    per patient "on lower back and both legs"  . Sleep apnea    uses CPAP  . TESTICULAR MASS, LEFT 02/16/2007    Past Surgical History:  Procedure Laterality Date  . ANTERIOR CERVICAL DECOMP/DISCECTOMY FUSION N/A 02/01/2017   Procedure: ANTERIOR CERVICAL DECOMPRESSION/DISCECTOMY FUSION CERVICAL THREE-FOUR , CERVICAL FOUR-FIVE  CERVICAL FIVE-SIX;  Surgeon: Consuella Lose, MD;  Location: [place];  Service: Neurosurgery;  Laterality: N/A;  . APPENDECTOMY    . COLONOSCOPY  02/05/2010   [facility]  . KNEE SURGERY     2 Lknee arthroscopy, 3 R knee arthroscpoy  . KNEE SURGERY Right 11/12/2016  . LUMBAR LAMINECTOMY/DECOMPRESSION MICRODISCECTOMY Right 05/22/2019   Procedure: LAMINOTOMY AND MICRODISCECTOMY RIGHT LUMBAR FOUR- LUMBAR FIVE;  Surgeon: Consuella Lose, MD;  Location: [facility];  Service: Neurosurgery;   Laterality: Right;  LAMINOTOMY AND MICRODISCECTOMY RIGHT LUMBAR FOUR- LUMBAR FIVE  . TONSILLECTOMY      There were no vitals filed for this visit.   Subjective Assessment - 10/19/19 0810    Subjective No new changes and no falls since last visit.    Pertinent History CIDP, ACDF, lumbar discectomy, bil knee scopes, Paget's disease    Patient Stated Goals improve gait    Currently in Pain? Yes    Pain Score 2     Pain Location Back    Pain Orientation Lower              [facility] PT Assessment - 10/19/19 0001      Assessment   Medical Diagnosis CIDP, neurologic gait disorder    Referring Provider (PT) Delice Lesch, MD    Onset Date/Surgical Date 11/16/16    Hand Dominance Right    Prior Therapy yes      Standardized Balance Assessment   Five times sit to stand comments  15.57 sec           [facility] Adult PT Treatment/Exercise - 10/19/19 0001      Knee/Hip Exercises: Stretches   Piriformis Stretch Right;Left;2 reps;30 seconds   seated    Gastroc Stretch Both;1 rep;30 seconds    Other Knee/Hip Stretches standing adductor stretch x 15 sec x 3 reps each side (side lunge)      Knee/Hip Exercises: Aerobic   Nustep L5: 4 min for warm up.  Balance Exercises - 10/19/19 0001      Balance Exercises: Standing   Standing Eyes Opened Narrow base of support (BOS);Foam/compliant surface;1 rep;20 secs    Standing Eyes Closed Narrow base of support (BOS);Wide (BOA);Foam/compliant surface;3 reps;10 secs    Tandem Stance Eyes open;Foam/compliant surface;2 reps;30 secs    SLS Eyes open;Foam/compliant surface;Intermittent upper extremity support;2 reps;15 secs    Stepping Strategy Anterior;Lateral;Foam/compliant surface;5 reps   intermittent UE to steady, CGA    Step Ups Forward;Intermittent UE support   Bosu x 3 reps, mini tramp x 5 reps each leg   Gait with Head Turns Foam/compliant surface   walking in place on mini tramp    Step Over Hurdles / Cones stepping over 6" pool  noodle, onto blue foam, and around yoga egg x 8 reps alternating LEs     Sit to Stand Standard surface;Without upper extremity support   5 reps                 PT Long Term Goals - 10/09/19 1209      PT LONG TERM GOAL #1   Title Patient independent with HEP for balance and core strength    Time 8    Period Weeks    Status New    Target Date 12/04/19      PT LONG TERM GOAL #2   Title Improved BERG score to 55/56    Baseline 53/56    Time 8    Period Weeks    Status New      PT LONG TERM GOAL #3   Title Improved 5x sit to stand to <= 12 seconds    Time 8    Period Weeks    Status New      PT LONG TERM GOAL #4   Title Patient to demo improved right knee and hip flexion during swing phase of gait.    Baseline ----    Time 8    Period Weeks    Status New      PT LONG TERM GOAL #5   Title -----------                 Plan - 10/19/19 1322    Clinical Impression Statement Pt demonstrates a posterior lateral lean with Lt SLS exercises today.  Horiz head turns during dynamic exercise continues to be challenging. Sit to stand a little slower today.  Stepping onto Bosu was too challenging; mini trampoline as well as 3" blue pad gave sufficient challenge. Encouraged pt to bring cane to session for safety if LEs are fatigued at end of session.    Comorbidities CIDP, ACDF, lumbar discectomy, bil knee scopes, Paget's disease    PT Treatment/Interventions ADLs/Self Care Home Management;Aquatic Therapy;Gait training;Therapeutic activities;Therapeutic exercise;Balance training;Neuromuscular re-education;Manual techniques;Patient/family education    PT Next Visit Plan higher level balance activities, core strength, gait, resisted trunk extension    PT Home Exercise Plan [email or web address]    Consulted and Agree with Plan of Care Patient           Patient will benefit from skilled therapeutic intervention in order to improve the following deficits and impairments:  Abnormal gait,  Difficulty walking, Impaired flexibility, Decreased strength, Decreased balance  Visit Diagnosis: Muscle weakness (generalized)  Unsteadiness on feet  Other abnormalities of gait and mobility     Problem List Patient Active Problem List   Diagnosis Date Noted  . Lumbar radiculopathy 05/22/2019  . Spastic gait 03/07/2019  . Myalgia 11/01/2018  .  Chronic bilateral low back pain without sciatica 07/21/2018  . 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 CIDP (chronic inflammatory demyelinating polyneuropathy) ([place]) 12/30/2017  . Edema 03/01/2017  . Hypokalemia   . Slow transit constipation   . Steroid-induced hyperglycemia   . Urinary retention   . Accidental drug overdose   . Postoperative pain   . Hyponatremia   . Cervical myelopathy ([place]) 02/01/2017  . Shortness of breath at rest   . Hypoglycemia   . Spondylosis, cervical, with myelopathy   . Neuropathic pain   . Benign essential HTN   . Labile blood glucose   . Muscle spasm   . GAD (generalized anxiety disorder)   . Acute inflammatory demyelinating polyneuropathy ([place]) 01/11/2017  . Anxiety state   . Neurogenic bladder   . Depression 12/30/2016  . Leg weakness, bilateral 12/30/2016  . 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 GBS (Guillain Barre syndrome) ([place])   . AIDP (acute inflammatory demyelinating polyneuropathy) ([place]) 11/27/2016  . Weakness 11/20/2016  . Weakness of both arms 10/30/2016  . 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 Obstructive sleep apnea 03/24/2016  . Numbness 03/18/2016  . Mild nonproliferative diabetic retinopathy of both eyes without macular edema associated with type 2 diabetes mellitus ([place]) 05/24/2015  . Nuclear cataract of both eyes 05/24/2015  . Diabetes mellitus without complication ([place]) 44/81/8563  . 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 Eustachian tube dysfunction 02/12/2015  . Acute maxillary sinusitis 02/12/2015  . Wellness examination  08/22/2014  . Alkaline phosphatase elevation 08/22/2014  . 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 Benign non-nodular prostatic hyperplasia with lower urinary tract symptoms 01/31/2014  . Lesion of eyelid 09/26/2013  . Screening for prostate cancer 04/24/2013  . Diabetes ([place]) 06/16/2012  . ED (erectile dysfunction) of organic origin 06/03/2012  . Routine general medical examination at a health care facility 04/10/2012  . BPH (benign prostatic hyperplasia) 02/03/2010  . HEMOCCULT POSITIVE STOOL 05/25/2008  . ELBOW PAIN, RIGHT 07/04/2007  . Dyslipidemia 02/16/2007  . ANXIETY 02/16/2007  . ERECTILE DYSFUNCTION 02/16/2007  . Essential hypertension 02/16/2007  . TESTICULAR MASS, LEFT 02/16/2007  . KNEE PAIN, CHRONIC 02/16/2007  . Michell Heinrich OF 02/16/2007   Kerin Perna, PTA 10/19/19 1:28 PM  [place], [place], [address] Phone: [phone]   Fax:  [phone]  Name: AYRTON MCVAY MRN: [id number] Date of Birth: September 28, 1950

## 2019-10-20 ENCOUNTER — Ambulatory Visit: Payer: BC Managed Care – PPO | Admitting: Endocrinology

## 2019-10-23 ENCOUNTER — Other Ambulatory Visit: Payer: Self-pay | Admitting: Endocrinology

## 2019-10-26 ENCOUNTER — Encounter: Payer: Self-pay | Admitting: Endocrinology

## 2019-10-26 ENCOUNTER — Ambulatory Visit (INDEPENDENT_AMBULATORY_CARE_PROVIDER_SITE_OTHER): Payer: BC Managed Care – PPO | Admitting: Physical Therapy

## 2019-10-26 ENCOUNTER — Other Ambulatory Visit: Payer: Self-pay

## 2019-10-26 DIAGNOSIS — R2689 Other abnormalities of gait and mobility: Secondary | ICD-10-CM | POA: Diagnosis not present

## 2019-10-26 DIAGNOSIS — M6281 Muscle weakness (generalized): Secondary | ICD-10-CM | POA: Diagnosis not present

## 2019-10-26 DIAGNOSIS — R2681 Unsteadiness on feet: Secondary | ICD-10-CM

## 2019-10-26 NOTE — Therapy (Signed)
[place], [place], [address] Phone: [phone]   Fax:  [phone]  Physical Therapy Treatment  Patient Details  Name: [facility] P[facility] MRN: [id number] Date of Birth: [birth year] Referring Provider (PT): Delice Lesch, MD   Encounter Date: 10/26/2019   PT End of Session - 10/26/19 0815    Visit Number 5    Date for PT Re-Evaluation 12/04/19    Authorization Type BCBS    PT Start Time 0804    PT Stop Time 0840    PT Time Calculation (min) 36 min    Equipment Utilized During Treatment Gait belt    Activity Tolerance Patient tolerated treatment well    Behavior During Therapy [facility] for tasks assessed/performed           Past Medical History:  Diagnosis Date  . ADENOIDECTOMY, HX OF 02/16/2007  . Anxiety    pt. states he does not have anxiety.  Marland Kitchen BENIGN PROSTATIC HYPERTROPHY, WITH OBSTRUCTION 02/03/2010  . 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 DIABETES MELLITUS, TYPE I, UNCONTROLLED 12/29/2006  . ERECTILE DYSFUNCTION 02/16/2007  . HYPERLIPIDEMIA 02/16/2007  . HYPERTENSION 02/16/2007  . Nerve pain    per patient "on lower back and both legs"  . Sleep apnea    uses CPAP  . TESTICULAR MASS, LEFT 02/16/2007    Past Surgical History:  Procedure Laterality Date  . ANTERIOR CERVICAL DECOMP/DISCECTOMY FUSION N/A 02/01/2017   Procedure: ANTERIOR CERVICAL DECOMPRESSION/DISCECTOMY FUSION CERVICAL THREE-FOUR , CERVICAL FOUR-FIVE  CERVICAL FIVE-SIX;  Surgeon: Consuella Lose, MD;  Location: [facility];  Service: Neurosurgery;  Laterality: N/A;  . APPENDECTOMY    . COLONOSCOPY  02/05/2010   [facility]  . KNEE SURGERY     2 Lknee arthroscopy, 3 R knee arthroscpoy  . KNEE SURGERY Right 11/12/2016  . LUMBAR LAMINECTOMY/DECOMPRESSION MICRODISCECTOMY Right 05/22/2019   Procedure: LAMINOTOMY AND MICRODISCECTOMY RIGHT LUMBAR FOUR- LUMBAR FIVE;  Surgeon: Consuella Lose, MD;  Location: [facility];  Service: Neurosurgery;  Laterality: Right;  LAMINOTOMY AND MICRODISCECTOMY RIGHT LUMBAR FOUR- LUMBAR FIVE  . TONSILLECTOMY      There were no vitals filed for this visit.   Subjective Assessment - 10/26/19 0815    Subjective Pt reports he is noticing improvement, but in smaller increments.  He is stretching at home, but probably not as much as he should.    Pertinent History CIDP, ACDF, lumbar discectomy, bil knee scopes, Paget's disease    Currently in Pain? No/denies              [facility] PT Assessment - 10/26/19 0001      Assessment   Medical Diagnosis CIDP, neurologic gait disorder    Referring Provider (PT) Del[facility] Lesch, MD    Onset Date/Surgical Date 11/16/16    Hand Dominance Right    Prior Therapy yes      Standardized Balance Assessment   Five times sit to stand comments  14.66            [facility] Adult PT Treatment/Exercise - 10/26/19 0001      Knee/Hip Exercises: Stretches   Passive Hamstring Stretch Right;Left;2 reps;20 seconds   seated   Hip Flexor Stretch Right;Left;2 reps;20 seconds   standing   Piriformis Stretch Right;Left;2 reps;30 seconds   seated    Other Knee/Hip Stretches standing adductor stretch x 15 sec x 3 reps each side (side lunge)    Other Knee/Hip Stretches low back stretch at counter (  flexion to ext) x 10 sec each direction       Knee/Hip Exercises: Aerobic   Recumbent Bike L3: 4 min       Knee/Hip Exercises: Seated   Sit to Sand 5 reps;without UE support           Balance Exercises - 10/26/19 0001      Balance Exercises: Standing   Standing Eyes Opened Narrow base of support (BOS);Foam/compliant surface;1 rep;10 secs    Standing Eyes Closed Narrow base of support (BOS);Foam/compliant surface;2 reps;10 secs;20 secs    Tandem Stance Eyes open;Foam/compliant surface;Intermittent upper extremity support   with horiz head turns   SLS Eyes open;Foam/compliant surface;Intermittent upper extremity support;2  reps;15 secs    Standing, One Foot on a Step Eyes open;8 inch;5 reps   toe/foot taps to stool    Stepping Strategy Foam/compliant surface;Lateral;5 reps   diagonal pattern   Rockerboard EO;Anterior/posterior;30 seconds;Intermittent UE support;EC;10 seconds    Tandem Gait Forward;Retro;1 rep    Turning Right;Left;Other reps (comment)   2 reps on mini trampoline   Step Over Hurdles / Cones stepping laterally over 9" step stool x 5 reps each side, intermittent UE to steady when fatigued.     Marching Foam/compliant surface   on mini trampoline                 PT Long Term Goals - 10/26/19 0910      PT LONG TERM GOAL #1   Title Patient independent with HEP for balance and core strength    Time 8    Period Weeks    Status On-going      PT LONG TERM GOAL #2   Title Improved BERG score to 55/56    Baseline 53/56    Time 8    Period Weeks    Status On-going      PT LONG TERM GOAL #3   Title Improved 5x sit to stand to <= 12 seconds    Time 8    Period Weeks    Status On-going      PT LONG TERM GOAL #4   Title Patient to demo improved right knee and hip flexion during swing phase of gait.    Baseline ----    Time 8    Period Weeks    Status Partially Met      PT LONG TERM GOAL #5   Title -----------                 Plan - 10/26/19 1610    Clinical Impression Statement Eyes closed, single leg stance on compliant surface, and narrow stance on compliant surface continue to give the most challenge.  Pt's gait quality improves with focus and cues for increased step height of RLE.  Sit to stand time 14.66 sec; varies dependent on day. continued encouragement given to pt to take breaks and stand/stretch during work day to decrease tightness.  Goals are ongoing.    Comorbidities CIDP, ACDF, lumbar discectomy, bil knee scopes, Paget's disease    Rehab Potential Good    PT Frequency 2x / week    PT Duration 8 weeks    PT Treatment/Interventions ADLs/Self Care Home  Management;Aquatic Therapy;Gait training;Therapeutic activities;Therapeutic exercise;Balance training;Neuromuscular re-education;Manual techniques;Patient/family education    PT Next Visit Plan higher level balance activities, core strength, gait    PT Home Exercise Plan [email or web address]    Consulted and Agree with Plan of Care Patient  Patient will benefit from skilled therapeutic intervention in order to improve the following deficits and impairments:  Abnormal gait, Difficulty walking, Impaired flexibility, Decreased strength, Decreased balance  Visit Diagnosis: Muscle weakness (generalized)  Unsteadiness on feet  Other abnormalities of gait and mobility     Problem List Patient Active Problem List   Diagnosis Date Noted  . Lumbar radiculopathy 05/22/2019  . Spastic gait 03/07/2019  . Myalgia 11/01/2018  . Chronic bilateral low back pain without sciatica 07/21/2018  . 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 CIDP (chronic inflammatory demyelinating polyneuropathy) ([facility]) 12/30/2017  . Edema 03/01/2017  . Hypokalemia   . Slow transit constipation   . Steroid-induced hyperglycemia   . Urinary retention   . Accidental drug overdose   . Postoperative pain   . Hyponatremia   . Cervical myelopathy ([facility]) 02/01/2017  . Shortness of breath at rest   . Hypoglycemia   . Spondylosis, cervical, with myelopathy   . Neuropathic pain   . Benign essential HTN   . Labile blood glucose   . Muscle spasm   . GAD (generalized anxiety disorder)   . Acute inflammatory demyelinating polyneuropathy ([facility]) 01/11/2017  . Anxiety state   . Neurogenic bladder   . Depression 12/30/2016  . Leg weakness, bilateral 12/30/2016  . 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[facility]) 12/30/2016  . GBS (Guillain Barre syndrome) ([facility])   . AIDP (acute inflammatory demyelinating polyneuropathy) ([facility]) 11/27/2016  . Weakness 11/20/2016  . Weakness of both arms 10/30/2016  . Asymmetrical  hearing loss of both ears 03/25/2016  . Obstructive sleep apnea 03/24/2016  . Numbness 03/18/2016  . Mild nonproliferative diabetic retinopathy of both eyes without macular edema associated with type 2 diabetes mellitus ([facility]) 05/24/2015  . Nuclear cataract of both eyes 05/24/2015  . Diabetes mellitus without complication ([facility]) 04/06/2015  . 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 Eustachian tube dysfunction 02/12/2015  . Acute maxillary sinusitis 02/12/2015  . Wellness examination 08/22/2014  . Alkaline phosphatase elevation 08/22/2014  . 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 Benign non-nodular prostatic hyperplasia with lower urinary tract symptoms 01/31/2014  . Lesion of eyelid 09/26/2013  . Screening for prostate cancer 04/24/2013  . Diabetes ([facility]) 06/16/2012  . ED (erectile dysfunction) of organic origin 06/03/2012  . Routine general medical examination at a health care facility 04/10/2012  . BPH (benign prostatic hyperplasia) 02/03/2010  . HEMOCCULT POSITIVE STOOL 05/25/2008  . ELBOW PAIN, RIGHT 07/04/2007  . Dyslipidemia 02/16/2007  . ANXIETY 02/16/2007  . ERECTILE DYSFUNCTION 02/16/2007  . Essential hypertension 02/16/2007  . TESTICULAR MASS, LEFT 02/16/2007  . KNEE PAIN, CHRONIC 02/16/2007  . ADENOIDECTOMY, HX OF 02/16/2007   Jennifer Carlson-Long, PTA 10/26/19 9:15 AM  [facility] [address] , [place], [address] Phone: [phone]   Fax:  [phone]  Name: Aristide G Holstad MRN: [id number] Date of Birth: 05/09/1950   

## 2019-10-30 ENCOUNTER — Encounter: Payer: BC Managed Care – PPO | Admitting: Physical Therapy

## 2019-11-02 ENCOUNTER — Other Ambulatory Visit: Payer: Self-pay

## 2019-11-02 ENCOUNTER — Ambulatory Visit (INDEPENDENT_AMBULATORY_CARE_PROVIDER_SITE_OTHER): Payer: BC Managed Care – PPO | Admitting: Physical Therapy

## 2019-11-02 DIAGNOSIS — R2681 Unsteadiness on feet: Secondary | ICD-10-CM | POA: Diagnosis not present

## 2019-11-02 DIAGNOSIS — M6281 Muscle weakness (generalized): Secondary | ICD-10-CM

## 2019-11-02 DIAGNOSIS — R2689 Other abnormalities of gait and mobility: Secondary | ICD-10-CM | POA: Diagnosis not present

## 2019-11-02 NOTE — Therapy (Signed)
[place], [place], [address] Phone: [phone]   Fax:  [phone]  Physical Therapy Treatment  Patient Details  Name: Kenneth Pace MRN: [id number] Date of Birth: 07-May-1950 Referring Provider (PT): Delice Lesch, MD   Encounter Date: 11/02/2019   PT End of Session - 11/02/19 1141    Visit Number 6    Date for PT Re-Evaluation 12/04/19    Authorization Type BCBS    PT Start Time 1102    PT Stop Time 1142    PT Time Calculation (min) 40 min    Equipment Utilized During Treatment Gait belt    Activity Tolerance Patient tolerated treatment well    Behavior During Therapy [facility] for tasks assessed/performed           Past Medical History:  Diagnosis Date  . ADENOIDECTOMY, HX OF 02/16/2007  . Anxiety    pt. states he does not have anxiety.  Marland Kitchen BENIGN PROSTATIC HYPERTROPHY, WITH OBSTRUCTION 02/03/2010  . 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 DIABETES MELLITUS, TYPE I, UNCONTROLLED 12/29/2006  . ERECTILE DYSFUNCTION 02/16/2007  . HYPERLIPIDEMIA 02/16/2007  . HYPERTENSION 02/16/2007  . Nerve pain    per patient "on lower back and both legs"  . Sleep apnea    uses CPAP  . TESTICULAR MASS, LEFT 02/16/2007    Past Surgical History:  Procedure Laterality Date  . ANTERIOR CERVICAL DECOMP/DISCECTOMY FUSION N/A 02/01/2017   Procedure: ANTERIOR CERVICAL DECOMPRESSION/DISCECTOMY FUSION CERVICAL THREE-FOUR , CERVICAL FOUR-FIVE  CERVICAL FIVE-SIX;  Surgeon: Consuella Lose, MD;  Location: [facility];  Service: Neurosurgery;  Laterality: N/A;  . APPENDECTOMY    . COLONOSCOPY  02/05/2010   [facility]  . KNEE SURGERY     2 Lknee arthroscopy, 3 R knee arthroscpoy  . KNEE SURGERY Right 11/12/2016  . LUMBAR LAMINECTOMY/DECOMPRESSION MICRODISCECTOMY Right 05/22/2019   Procedure: LAMINOTOMY AND MICRODISCECTOMY RIGHT LUMBAR FOUR- LUMBAR FIVE;  Surgeon: Consuella Lose, MD;  Location: [facility];  Service: Neurosurgery;  Laterality: Right;  LAMINOTOMY AND MICRODISCECTOMY RIGHT LUMBAR FOUR- LUMBAR FIVE  . TONSILLECTOMY      There were no vitals filed for this visit.   Subjective Assessment - 11/02/19 1107    Subjective Pt reports he hasn't done much since the booster of vaccine (4 days ago), "It really knocked me down".    Pertinent History CIDP, ACDF, lumbar discectomy, bil knee scopes, Paget's disease    Currently in Pain? Yes    Pain Score 3     Pain Location Back    Pain Orientation Lower    Pain Descriptors / Indicators Dull;Aching              [facility] PT Assessment - 11/02/19 0001      Assessment   Medical Diagnosis CIDP, neurologic gait disorder    Referring Provider (PT) Delice Lesch, MD    Onset Date/Surgical Date 11/16/16    Hand Dominance Right    Prior Therapy yes      Standardized Balance Assessment   Five times sit to stand comments  11.41              [facility] Adult PT Treatment/Exercise - 11/02/19 0001      Lumbar Exercises: Stretches   Standing Extension 3 reps;10 seconds   hands on counter, leaning hips forward   Other Lumbar Stretch Exercise forward L stretch with hands on counter (modified downward dog for leg/back stretch) x 10 sec x  3 reps     Other Lumbar Stretch Exercise side to side lunge with trunk forward flexed leaning on counter for adductor stretch x 10 sec x 3-4 reps each side.    hip circles in standing for lumbar/ hip mobility (simulating using a hulahoop)      Lumbar Exercises: Quadruped   Other Quadruped Lumbar Exercises 1 rep of bird dog each arm/leg      Knee/Hip Exercises: Stretches   Hip Flexor Stretch Right;Left;1 rep;20 seconds   kneeling position     Knee/Hip Exercises: Aerobic   Other Aerobic 2 laps around gym for warm up (80 ft/ lap)      Knee/Hip Exercises: Standing   Other Standing Knee Exercises high kneeling with one foot forward:  wood chop holding 2.4# and 4.4# ball x 5 reps each,  each foot forward (kneelingon 3"pad)           Balance Exercises - 11/02/19 0001      Balance Exercises: Standing   Standing Eyes Closed Narrow base of support (BOS);Foam/compliant surface;2 reps;10 secs;20 secs    Tandem Stance Eyes open;Foam/compliant surface;2 reps;20 secs    SLS Eyes open;Foam/compliant surface;Intermittent upper extremity support;2 reps;10 secs;15 secs    Stepping Strategy Foam/compliant surface;Lateral;5 reps   diagonal pattern   Tandem Gait Forward;Intermittent upper extremity support;4 reps    Retro Gait 2 reps    Turning Right;Left;5 reps    Marching Foam/compliant surface;Static;15 reps                  PT Long Term Goals - 11/02/19 2012      PT LONG TERM GOAL #1   Title Patient independent with HEP for balance and core strength    Time 8    Period Weeks    Status On-going      PT LONG TERM GOAL #2   Title Improved BERG score to 55/56    Baseline 53/56    Time 8    Period Weeks    Status On-going      PT LONG TERM GOAL #3   Title Improved 5x sit to stand to <= 12 seconds    Time 8    Period Weeks    Status Achieved      PT LONG TERM GOAL #4   Title Patient to demo improved right knee and hip flexion during swing phase of gait.    Baseline ----    Time 8    Period Weeks    Status Partially Met      PT LONG TERM GOAL #5   Title -----------                 Plan - 11/02/19 1935    Clinical Impression Statement Pt demonstrated improved 5x sit to stand; has met LTG #3. Standing turns have improved in speed, however at times unsteady with Rt turns.  Pt is motivated to progress towards remaining LTGs.  He will be going out of state after next week.    Comorbidities CIDP, ACDF, lumbar discectomy, bil knee scopes, Paget's disease    Rehab Potential Good    PT Frequency 2x / week    PT Duration 8 weeks    PT Treatment/Interventions ADLs/Self Care Home Management;Aquatic Therapy;Gait training;Therapeutic activities;Therapeutic  exercise;Balance training;Neuromuscular re-education;Manual techniques;Patient/family education    PT Next Visit Plan higher level balance activities, core strength, gait    PT Home Exercise Plan [id number]    [id number] and Agree with Plan of Care  Patient           Patient will benefit from skilled therapeutic intervention in order to improve the following deficits and impairments:  Abnormal gait, Difficulty walking, Impaired flexibility, Decreased strength, Decreased balance  Visit Diagnosis: Muscle weakness (generalized)  Unsteadiness on feet  Other abnormalities of gait and mobility     Problem List Patient Active Problem List   Diagnosis Date Noted  . Lumbar radiculopathy 05/22/2019  . Spastic gait 03/07/2019  . Myalgia 11/01/2018  . Chronic bilateral low back pain without sciatica 07/21/2018  . 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 CIDP (chronic inflammatory demyelinating polyneuropathy) ([place]) 12/30/2017  . Edema 03/01/2017  . Hypokalemia   . Slow transit constipation   . Steroid-induced hyperglycemia   . Urinary retention   . Accidental drug overdose   . Postoperative pain   . Hyponatremia   . Cervical myelopathy ([place]) 02/01/2017  . Shortness of breath at rest   . Hypoglycemia   . Spondylosis, cervical, with myelopathy   . Neuropathic pain   . Benign essential HTN   . Labile blood glucose   . Muscle spasm   . GAD (generalized anxiety disorder)   . Acute inflammatory demyelinating polyneuropathy ([place]) 01/11/2017  . Anxiety state   . Neurogenic bladder   . Depression 12/30/2016  . Leg weakness, bilateral 12/30/2016  . 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 GBS (Guillain Barre syndrome) ([place])   . AIDP (acute inflammatory demyelinating polyneuropathy) ([place]) 11/27/2016  . Weakness 11/20/2016  . Weakness of both arms 10/30/2016  . 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 Obstructive sleep apnea 03/24/2016  .  Numbness 03/18/2016  . 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 Nuclear cataract of both eyes 05/24/2015  . Diabetes mellitus without complication ([place]) 16/11/9602  . 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 Eustachian tube dysfunction 02/12/2015  . Acute maxillary sinusitis 02/12/2015  . Wellness examination 08/22/2014  . Alkaline phosphatase elevation 08/22/2014  . 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 Benign non-nodular prostatic hyperplasia with lower urinary tract symptoms 01/31/2014  . Lesion of eyelid 09/26/2013  . Screening for prostate cancer 04/24/2013  . Diabetes ([place]) 06/16/2012  . ED (erectile dysfunction) of organic origin 06/03/2012  . Routine general medical examination at a health care facility 04/10/2012  . BPH (benign prostatic hyperplasia) 02/03/2010  . HEMOCCULT POSITIVE STOOL 05/25/2008  . ELBOW PAIN, RIGHT 07/04/2007  . Dyslipidemia 02/16/2007  . ANXIETY 02/16/2007  . ERECTILE DYSFUNCTION 02/16/2007  . Essential hypertension 02/16/2007  . TESTICULAR MASS, LEFT 02/16/2007  . KNEE PAIN, CHRONIC 02/16/2007  . Michell Heinrich OF 02/16/2007   Kerin Perna, PTA 11/02/19 6:18 PM  [facility] [place], [place], [address] Phone: [phone]   Fax:  [phone]  Name: Kenneth Pace MRN: [id number] Date of Birth: [birth year]

## 2019-11-07 ENCOUNTER — Encounter
Payer: BC Managed Care – PPO | Attending: Physical Medicine & Rehabilitation | Admitting: Physical Medicine & Rehabilitation

## 2019-11-07 ENCOUNTER — Encounter: Payer: Self-pay | Admitting: Physical Medicine & Rehabilitation

## 2019-11-07 ENCOUNTER — Other Ambulatory Visit: Payer: Self-pay

## 2019-11-07 VITALS — BP 157/84 | HR 78 | Temp 98.2°F | Ht 72.0 in | Wt 242.0 lb

## 2019-11-07 DIAGNOSIS — M791 Myalgia, unspecified site: Secondary | ICD-10-CM | POA: Diagnosis not present

## 2019-11-07 DIAGNOSIS — G959 Disease of spinal cord, unspecified: Secondary | ICD-10-CM

## 2019-11-07 DIAGNOSIS — M545 Low back pain, unspecified: Secondary | ICD-10-CM

## 2019-11-07 DIAGNOSIS — M792 Neuralgia and neuritis, unspecified: Secondary | ICD-10-CM | POA: Diagnosis not present

## 2019-11-07 DIAGNOSIS — M4712 Other spondylosis with myelopathy, cervical region: Secondary | ICD-10-CM | POA: Diagnosis not present

## 2019-11-07 DIAGNOSIS — G6181 Chronic inflammatory demyelinating polyneuritis: Secondary | ICD-10-CM | POA: Diagnosis not present

## 2019-11-07 DIAGNOSIS — R261 Paralytic gait: Secondary | ICD-10-CM

## 2019-11-07 DIAGNOSIS — R269 Unspecified abnormalities of gait and mobility: Secondary | ICD-10-CM

## 2019-11-07 DIAGNOSIS — G8929 Other chronic pain: Secondary | ICD-10-CM

## 2019-11-07 DIAGNOSIS — M5416 Radiculopathy, lumbar region: Secondary | ICD-10-CM

## 2019-11-07 NOTE — Progress Notes (Signed)
Subjective:    Patient ID: Kenneth Pace, male    DOB: Sep 15, 1950, 69 y.o.   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   Last clinic visit on 09/25/19.  Since that time, pt states he is still in therapies. He has questions regarding Shingles vaccine and GBS as well as prognosis. He is taking medications as prescribed. Had a fall when his dog jumped on him. Gym remains closed. Back is stable.   Pain Inventory Average Pain 2 Pain Right Now 2 My pain is intermittent, constant, dull and aching  In the last 24 hours, has pain interfered with the following? General activity 1 Relation with others 0 Enjoyment of life 0 What TIME of day is your pain at its worst? evening Sleep?   Good Pain is worse with: bending Pain improves with: therapy/exercise Relief from Meds: 8  Mobility walk without assistance walk with assistance use a cane ability to climb steps?  yes do you drive?  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  no  Physicians involved in your care Any changes since last visit?  yes   Family History  Problem Relation Age of Onset   Dementia Mother    Diabetes Mellitus I Mother    Hypertension Father        67   Healthy Sister    Rheum arthritis Brother    Cancer Neg Hx    Colon cancer Neg Hx    Esophageal cancer Neg Hx    Stomach cancer Neg Hx    Rectal cancer Neg Hx    Colon polyps Neg Hx    Social History   Socioeconomic History   Marital status: Married    Spouse name: Not on file   Number of children: Not on file   Years of education: Not on file   Highest education level: Not on file  Occupational History    Occupation: Lobbyist: [place]    Comment: [employer]  Tobacco Use   Smoking status: Never Smoker   Smokeless tobacco: Never Used  Scientific laboratory technician Use: Never used  Substance and Sexual Activity   Alcohol use: No   Drug use: No   Sexual activity: Not on file  Other Topics Concern   Not on file  Social History Narrative   He works for [employer]   He lives at home with wife.     Highest level of education:  BS, business admin   Right handed   Two story home   Social Determinants of Health   Financial Resource Strain:    Difficulty of Paying Living Expenses: Not on file  Food Insecurity:    Worried About Charity fundraiser in the Last Year: Not on file   [employer] of Food in the Last Year: Not on file  Transportation Needs:    Lack of Transportation (Medical): Not on file   Lack of Transportation (Non-Medical): Not on file  Physical Activity:    Days of Exercise per Week: Not on file   Minutes of Exercise per Session: Not on file  Stress:    Feeling of Stress :  Not on file  Social Connections:    Frequency of Communication with Friends and Family: Not on file   Frequency of Social Gatherings with Friends and Family: Not on file   Attends Religious Services: Not on file   Active Member of Clubs or Organizations: Not on file   Attends Archivist Meetings: Not on file   Marital Status: Not on file   Past Surgical History:  Procedure Laterality Date   ANTERIOR CERVICAL DECOMP/DISCECTOMY FUSION N/A 02/01/2017   Procedure: ANTERIOR CERVICAL DECOMPRESSION/DISCECTOMY FUSION CERVICAL THREE-FOUR , CERVICAL FOUR-FIVE  CERVICAL FIVE-SIX;  Surgeon: Consuella Lose, MD;  Location: [facility];  Service: Neurosurgery;  Laterality: N/A;   APPENDECTOMY     COLONOSCOPY  02/05/2010   [facility]   KNEE SURGERY     2 Lknee arthroscopy, 3 R knee arthroscpoy   KNEE SURGERY Right 11/12/2016   LUMBAR  LAMINECTOMY/DECOMPRESSION MICRODISCECTOMY Right 05/22/2019   Procedure: LAMINOTOMY AND MICRODISCECTOMY RIGHT LUMBAR FOUR- LUMBAR FIVE;  Surgeon: Consuella Lose, MD;  Location: [facility];  Service: Neurosurgery;  Laterality: Right;  LAMINOTOMY AND MICRODISCECTOMY RIGHT LUMBAR FOUR- LUMBAR FIVE   TONSILLECTOMY     Past Medical History:  Diagnosis Date   ADENOIDECTOMY, HX OF 02/16/2007   Anxiety    pt. states he does not have anxiety.   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[place])    diagnosed 11/2016   DIABETES MELLITUS, TYPE I, UNCONTROLLED 12/29/2006   ERECTILE DYSFUNCTION 02/16/2007   HYPERLIPIDEMIA 02/16/2007   HYPERTENSION 02/16/2007   Nerve pain    per patient "on lower back and both legs"   Sleep apnea    uses CPAP   TESTICULAR MASS, LEFT 02/16/2007   BP (!) 157/84    Pulse 78    Temp 98.2 F (36.8 C)    Ht 6' (1.829 m)    Wt 242 lb (109.8 kg)    SpO2 96%    BMI 32.82 kg/m   Opioid Risk Score:   Fall Risk Score:  `1  Depression screen PHQ 2/9  Depression screen [facility] 2/9 12/10/2017 07/07/2017 02/26/2017 02/26/2017  Decreased Interest 0 0 0 0  Down, Depressed, Hopeless 0 0 0 0  PHQ - 2 Score 0 0 0 0  Altered sleeping - - 1 -  Tired, decreased energy - - 0 -  Change in appetite - - 0 -  Feeling bad or failure about yourself  - - 0 -  Trouble concentrating - - 0 -  Moving slowly or fidgety/restless - - 1 -  Suicidal thoughts - - 0 -  PHQ-9 Score - - 2 -  Difficult doing work/chores - - Not difficult at all -  Some recent data might be hidden   Review of Systems  Constitutional: Negative.   HENT: Negative.   Eyes: Negative.   Respiratory: Negative.   Cardiovascular: Positive for leg swelling.  Gastrointestinal: Negative.   Endocrine: Negative.   Genitourinary: Negative.   Musculoskeletal: Positive for back pain and gait problem.  Skin: Negative.   Allergic/Immunologic: Negative.    Neurological: Positive for weakness and numbness.        Balance   Hematological: Negative.   Psychiatric/Behavioral: Negative.   All other systems reviewed and are negative.     Objective:   Physical Exam  Constitutional: NAD. Vital signs reviewed. HENT: Normocephalic. Atraumatic. Eyes: EOMI. No discharge. Cardiovascular: No JVD. Respiratory: Normal effort.  No stridor. GI: Non-distended. Skin: Warm and dry.  Intact.  Psych: Normal mood.  Normal behavior. Musc: No TTP lumbosacral PSPs Gait: Spastic Neurological: Alert Motor: B/L UE 5/5 proximal to distal  LLE: HF 5/5, KE, ADF/PF 5/5, unchanged RLE: HF 4-/5, KE, ADF/PF 5/5 Mas: no appeared increase in tone in b/l LE    Assessment & Plan:  Male with history of T2DM, OSA,,  subacute inflammatory polyradiculoneuropathy treated with IVIG for lower extremity weakness 11/2016 presents for follow up for cervical myelopathy and CIDP and back pain.   1.  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  Continue therapies  Continue follow up Neurology -IVIG per Neurology  Continue work - planning on retiring at the end of 2021   2.  Pain Management:   Gabapentin and Lyrica d/ced due to edema  No benefit with Mobic, Naproxen  Cont Lidoderm patch  Continue TENS prn  Continue Elavil to 75 qhs   Continue Cymbalta to 60mg  with food  Continue Baclofen per Neurology  Cont Tylenol  Continue Celebrex 100 qhs  3. Gait abnormality  Continue HEP  Less reliant on cane  See #1  4. Mechanical low back - multifactorial  Myalgia +/- facet arthropathy  MRI L-spine reviewed, multilevel predominantly foraminal and facet arthropathy  Continue Balcofen per Neurology  Cont Cymbalta   Note written for gym participation - discussed safety, but unable to go because it is still closed until 2022  Improved   5. Myalgia  Good benefit with TP injections,  again on 3/9  See #1, #4  6. Spasticity  See #1,2,3  Continue Baclofen  Cont HEP  Will consider further intervention after therapies and attendance in gym  7. Essential HTN  Follow up with PCP  8. Lumbar radiculopathy  S/p decompression right L4  Improved

## 2019-11-08 ENCOUNTER — Other Ambulatory Visit: Payer: Self-pay

## 2019-11-08 ENCOUNTER — Encounter: Payer: BC Managed Care – PPO | Attending: Family Medicine | Admitting: Nutrition

## 2019-11-08 DIAGNOSIS — E119 Type 2 diabetes mellitus without complications: Secondary | ICD-10-CM | POA: Insufficient documentation

## 2019-11-09 ENCOUNTER — Other Ambulatory Visit: Payer: Self-pay

## 2019-11-09 ENCOUNTER — Ambulatory Visit (INDEPENDENT_AMBULATORY_CARE_PROVIDER_SITE_OTHER): Payer: BC Managed Care – PPO | Admitting: Physical Therapy

## 2019-11-09 DIAGNOSIS — R2681 Unsteadiness on feet: Secondary | ICD-10-CM

## 2019-11-09 DIAGNOSIS — R2689 Other abnormalities of gait and mobility: Secondary | ICD-10-CM

## 2019-11-09 DIAGNOSIS — M6281 Muscle weakness (generalized): Secondary | ICD-10-CM

## 2019-11-09 NOTE — Patient Instructions (Signed)
REad manual on the Auto Mode. Call help line if questions.

## 2019-11-09 NOTE — Therapy (Signed)
[place], [place], [address] Phone: [phone]   Fax:  [phone]  Physical Therapy Treatment  Patient Details  Name: Kenneth Pace MRN: [id number] Date of Birth: [birth year] Referring Provider (PT): Delice Lesch, MD   Encounter Date: 11/09/2019   PT End of Session - 11/09/19 0849    Visit Number 7    Date for PT Re-Evaluation 12/04/19    Authorization Type BCBS    PT Start Time 0850    PT Stop Time 0928    PT Time Calculation (min) 38 min    Equipment Utilized During Treatment Gait belt    Activity Tolerance Patient tolerated treatment well    Behavior During Therapy [facility] for tasks assessed/performed           Past Medical History:  Diagnosis Date  . ADENOIDECTOMY, HX OF 02/16/2007  . Anxiety    pt. states he does not have anxiety.  Marland Kitchen BENIGN PROSTATIC HYPERTROPHY, WITH OBSTRUCTION 02/03/2010  . 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 DIABETES MELLITUS, TYPE I, UNCONTROLLED 12/29/2006  . ERECTILE DYSFUNCTION 02/16/2007  . HYPERLIPIDEMIA 02/16/2007  . HYPERTENSION 02/16/2007  . Nerve pain    per patient "on lower back and both legs"  . Sleep apnea    uses CPAP  . TESTICULAR MASS, LEFT 02/16/2007    Past Surgical History:  Procedure Laterality Date  . ANTERIOR CERVICAL DECOMP/DISCECTOMY FUSION N/A 02/01/2017   Procedure: ANTERIOR CERVICAL DECOMPRESSION/DISCECTOMY FUSION CERVICAL THREE-FOUR , CERVICAL FOUR-FIVE  CERVICAL FIVE-SIX;  Surgeon: Consuella Lose, MD;  Location: [place];  Service: Neurosurgery;  Laterality: N/A;  . APPENDECTOMY    . COLONOSCOPY  02/05/2010   [facility]  . KNEE SURGERY     2 Lknee arthroscopy, 3 R knee arthroscpoy  . KNEE SURGERY Right 11/12/2016  . LUMBAR LAMINECTOMY/DECOMPRESSION MICRODISCECTOMY Right 05/22/2019   Procedure: LAMINOTOMY AND MICRODISCECTOMY RIGHT LUMBAR FOUR- LUMBAR FIVE;  Surgeon: Consuella Lose, MD;  Location: [place];  Service: Neurosurgery;  Laterality: Right;  LAMINOTOMY AND MICRODISCECTOMY RIGHT LUMBAR FOUR- LUMBAR FIVE  . TONSILLECTOMY      There were no vitals filed for this visit.   Subjective Assessment - 11/09/19 0857    Subjective Pt reports no new changes since last visit; no falls. He's going on cruise next week.    Pertinent History CIDP, ACDF, lumbar discectomy, bil knee scopes, Paget's disease    Patient Stated Goals improve gait    Currently in Pain? Yes    Pain Score 2     Pain Location Back    Pain Orientation Lower    Pain Descriptors / Indicators Simonne Martinet PT Assessment - 11/09/19 0001      Assessment   Medical Diagnosis CIDP, neurologic gait disorder    Referring Provider (PT) Delice Lesch, MD    Onset Date/Surgical Date 11/16/16    Hand Dominance Right    Prior Therapy yes              [facility] Adult PT Treatment/Exercise - 11/09/19 0001      Lumbar Exercises: Stretches   Quad Stretch Right;Left;1 rep;20 seconds   foot under chair    Piriformis Stretch Right;Left;2 reps;20 seconds   seated   Other Lumbar Stretch Exercise side to side lunge with trunk forward flexed leaning on counter for adductor stretch x 10 sec x 3-4 reps each side.  hip circles in standing for lumbar/ hip mobility (simulating using a hulahoop)      Knee/Hip Exercises: Standing   Other Standing Knee Exercises high kneeling with one foot forward:  wood chop holding  4.4# ball x 8 reps each, each foot forward (kneelingon 3"pad)      Knee/Hip Exercises: Seated   Other Seated Knee/Hip Exercises seated on dynadisc -  pelvic tilts and circles,  marching, reaching just outside of base of support to challenge            Balance Exercises - 11/09/19 0001      Balance Exercises: Standing   Standing Eyes Closed Narrow base of support (BOS);Foam/compliant surface;2 reps;10 secs;20 secs    Tandem Stance Eyes open;Foam/compliant surface;2 reps;20 secs     SLS Eyes open;Solid surface;2 reps;10 secs    Retro Gait 2 reps    Sidestepping 2 reps    Turning Right;Left;3 reps   360 deg   Marching Solid surface;Intermittent upper extremity assist;Forwards;5 reps   12 ft reps, slow and medium speed.      OTAGO PROGRAM   Back Extension Standing;5 reps    Trunk Movements Standing;5 reps                  PT Long Term Goals - 11/02/19 2012      PT LONG TERM GOAL #1   Title Patient independent with HEP for balance and core strength    Time 8    Period Weeks    Status On-going      PT LONG TERM GOAL #2   Title Improved BERG score to 55/56    Baseline 53/56    Time 8    Period Weeks    Status On-going      PT LONG TERM GOAL #3   Title Improved 5x sit to stand to <= 12 seconds    Time 8    Period Weeks    Status Achieved      PT LONG TERM GOAL #4   Title Patient to demo improved right knee and hip flexion during swing phase of gait.    Baseline ----    Time 8    Period Weeks    Status Partially Met      PT LONG TERM GOAL #5   Title -----------                 Plan - 11/09/19 0932    Clinical Impression Statement Pt demonstrates some difficulty with slow speed marching and tandem stance with Rt leg back on compliant surface.  Pt reported increased knee discomfort with high kneeling on 3" pad today; some decreased balance with wood chop when kneeling on Rt. Pt reported some increased low back soreness at end of session; he stated he would perform self-care at home to decrease pain.  Pt is making gradual gains towards remaining LTGs.    Comorbidities CIDP, ACDF, lumbar discectomy, bil knee scopes, Paget's disease    Rehab Potential Good    PT Frequency 2x / week    PT Duration 8 weeks    PT Treatment/Interventions ADLs/Self Care Home Management;Aquatic Therapy;Gait training;Therapeutic activities;Therapeutic exercise;Balance training;Neuromuscular re-education;Manual techniques;Patient/family education    PT Next Visit  Plan BERG, assess readiness to d/c.    PT Home Exercise Plan [id number]    Consulted and Agree with Plan of Care Patient           Patient will benefit from skilled therapeutic intervention in order to  improve the following deficits and impairments:  Abnormal gait, Difficulty walking, Impaired flexibility, Decreased strength, Decreased balance  Visit Diagnosis: Muscle weakness (generalized)  Unsteadiness on feet  Other abnormalities of gait and mobility     Problem List Patient Active Problem List   Diagnosis Date Noted  . Lumbar radiculopathy 05/22/2019  . Spastic gait 03/07/2019  . Myalgia 11/01/2018  . Chronic bilateral low back pain without sciatica 07/21/2018  . 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 CIDP (chronic inflammatory demyelinating polyneuropathy) ([place]) 12/30/2017  . Edema 03/01/2017  . Hypokalemia   . Slow transit constipation   . Steroid-induced hyperglycemia   . Urinary retention   . Accidental drug overdose   . Postoperative pain   . Hyponatremia   . Cervical myelopathy ([place]) 02/01/2017  . Shortness of breath at rest   . Hypoglycemia   . Spondylosis, cervical, with myelopathy   . Neuropathic pain   . Benign essential HTN   . Labile blood glucose   . Muscle spasm   . GAD (generalized anxiety disorder)   . Acute inflammatory demyelinating polyneuropathy ([place]) 01/11/2017  . Anxiety state   . Neurogenic bladder   . Depression 12/30/2016  . Leg weakness, bilateral 12/30/2016  . 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 GBS (Guillain Barre syndrome) ([place])   . AIDP (acute inflammatory demyelinating polyneuropathy) ([place]) 11/27/2016  . Weakness 11/20/2016  . Weakness of both arms 10/30/2016  . 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 Obstructive sleep apnea 03/24/2016  . Numbness 03/18/2016  . Mild nonproliferative diabetic retinopathy of both eyes without macular edema associated with type 2 diabetes  mellitus ([place]) 05/24/2015  . Nuclear cataract of both eyes 05/24/2015  . Diabetes mellitus without complication ([place]) 16/11/9602  . 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 Eustachian tube dysfunction 02/12/2015  . Acute maxillary sinusitis 02/12/2015  . Wellness examination 08/22/2014  . Alkaline phosphatase elevation 08/22/2014  . 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 Benign non-nodular prostatic hyperplasia with lower urinary tract symptoms 01/31/2014  . Lesion of eyelid 09/26/2013  . Screening for prostate cancer 04/24/2013  . Diabetes ([place]) 06/16/2012  . ED (erectile dysfunction) of organic origin 06/03/2012  . Routine general medical examination at a health care facility 04/10/2012  . BPH (benign prostatic hyperplasia) 02/03/2010  . HEMOCCULT POSITIVE STOOL 05/25/2008  . ELBOW PAIN, RIGHT 07/04/2007  . Dyslipidemia 02/16/2007  . ANXIETY 02/16/2007  . ERECTILE DYSFUNCTION 02/16/2007  . Essential hypertension 02/16/2007  . TESTICULAR MASS, LEFT 02/16/2007  . KNEE PAIN, CHRONIC 02/16/2007  . Michell Heinrich OF 02/16/2007   Kerin Perna, PTA 11/09/19 9:50 AM  [facility] [place], [place], [address] Phone: [phone]   Fax:  [phone]  Name: JAGGER DEMONTE MRN: [id number] Date of Birth: May 23, 1950

## 2019-11-09 NOTE — Progress Notes (Signed)
Kenneth Pace was identified by name and DOB.  He is here today to start auto mode.  We discussed how this pump will work differently and we reviewed the steps to start.use this via slide presentation.  He was put in auto mode and the suspend before low feature was turned on automatically.  He signed the checklist as understanding all topics and had no final questions.

## 2019-11-11 DIAGNOSIS — Z20822 Contact with and (suspected) exposure to covid-19: Secondary | ICD-10-CM | POA: Diagnosis not present

## 2019-11-17 ENCOUNTER — Ambulatory Visit (HOSPITAL_COMMUNITY): Payer: BC Managed Care – PPO

## 2019-11-20 ENCOUNTER — Encounter: Payer: BC Managed Care – PPO | Admitting: Physical Therapy

## 2019-11-21 ENCOUNTER — Other Ambulatory Visit: Payer: Self-pay

## 2019-11-21 ENCOUNTER — Ambulatory Visit (INDEPENDENT_AMBULATORY_CARE_PROVIDER_SITE_OTHER): Payer: BC Managed Care – PPO | Admitting: Physical Therapy

## 2019-11-21 DIAGNOSIS — M6281 Muscle weakness (generalized): Secondary | ICD-10-CM

## 2019-11-21 DIAGNOSIS — R2681 Unsteadiness on feet: Secondary | ICD-10-CM

## 2019-11-21 DIAGNOSIS — R2689 Other abnormalities of gait and mobility: Secondary | ICD-10-CM | POA: Diagnosis not present

## 2019-11-21 NOTE — Therapy (Addendum)
[place], [place], [address] Phone: [phone]   Fax:  [phone]  Physical Therapy Treatment and Discharge Summary  Patient Details  Name: Kenneth Pace MRN: [id number] Date of Birth: February 26, 1950 Referring Provider (PT): Delice Lesch, MD   Encounter Date: 11/21/2019   PT End of Session - 11/21/19 0901    Visit Number 8    Date for PT Re-Evaluation 12/04/19    Authorization Type BCBS    PT Start Time 0850    PT Stop Time 0917    PT Time Calculation (min) 27 min    Equipment Utilized During Treatment Gait belt    Activity Tolerance Patient tolerated treatment well    Behavior During Therapy [facility] for tasks assessed/performed           Past Medical History:  Diagnosis Date  . ADENOIDECTOMY, HX OF 02/16/2007  . Anxiety    pt. states he does not have anxiety.  Marland Kitchen BENIGN PROSTATIC HYPERTROPHY, WITH OBSTRUCTION 02/03/2010  . 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 DIABETES MELLITUS, TYPE I, UNCONTROLLED 12/29/2006  . ERECTILE DYSFUNCTION 02/16/2007  . HYPERLIPIDEMIA 02/16/2007  . HYPERTENSION 02/16/2007  . Nerve pain    per patient "on lower back and both legs"  . Sleep apnea    uses CPAP  . TESTICULAR MASS, LEFT 02/16/2007    Past Surgical History:  Procedure Laterality Date  . ANTERIOR CERVICAL DECOMP/DISCECTOMY FUSION N/A 02/01/2017   Procedure: ANTERIOR CERVICAL DECOMPRESSION/DISCECTOMY FUSION CERVICAL THREE-FOUR , CERVICAL FOUR-FIVE  CERVICAL FIVE-SIX;  Surgeon: Consuella Lose, MD;  Location: [place];  Service: Neurosurgery;  Laterality: N/A;  . APPENDECTOMY    . COLONOSCOPY  02/05/2010   [facility]  . KNEE SURGERY     2 Lknee arthroscopy, 3 R knee arthroscpoy  . KNEE SURGERY Right 11/12/2016  . LUMBAR LAMINECTOMY/DECOMPRESSION MICRODISCECTOMY Right 05/22/2019   Procedure: LAMINOTOMY AND MICRODISCECTOMY RIGHT LUMBAR FOUR- LUMBAR FIVE;   Surgeon: Consuella Lose, MD;  Location: [place];  Service: Neurosurgery;  Laterality: Right;  LAMINOTOMY AND MICRODISCECTOMY RIGHT LUMBAR FOUR- LUMBAR FIVE  . TONSILLECTOMY      There were no vitals filed for this visit.   Subjective Assessment - 11/21/19 0923    Subjective Pt returns to therapy after being on cruise for a week.  He states he did  a lot of walking during his trip.  He has been doing some of his HEP.   He plans to join gym at end of year when he retires.    Pertinent History CIDP, ACDF, lumbar discectomy, bil knee scopes, Paget's disease    Patient Stated Goals improve gait    Currently in Pain? No/denies    Pain Score 0-No pain              [facility] PT Assessment - 11/21/19 0001      Assessment   Medical Diagnosis CIDP, neurologic gait disorder    Referring Provider (PT) Delice Lesch, MD    Onset Date/Surgical Date 11/16/16    Hand Dominance Right    Prior Therapy yes      Berg Balance Test   Sit to Stand Able to stand without using hands and stabilize independently    Standing Unsupported Able to stand safely 2 minutes    Sitting with Back Unsupported but Feet Supported on Floor or Stool Able to sit safely and securely 2 minutes    Stand to Sit Sits safely with minimal  use of hands    Transfers Able to transfer safely, minor use of hands    Standing Unsupported with Eyes Closed Able to stand 10 seconds safely    Standing Unsupported with Feet Together Able to place feet together independently and stand 1 minute safely    From Standing, Reach Forward with Outstretched Arm Can reach confidently >25 cm (10")    From Standing Position, Pick up Object from Floor Able to pick up shoe safely and easily    From Standing Position, Turn to Look Behind Over each Shoulder Looks behind from both sides and weight shifts well    Turn 360 Degrees Able to turn 360 degrees safely in 4 seconds or less    Standing Unsupported, Alternately Place Feet on Step/Stool Able to stand  independently and safely and complete 8 steps in 20 seconds    Standing Unsupported, One Foot in Front Able to plae foot ahead of the other independently and hold 30 seconds   LOB with Rt foot back   Standing on One Leg Able to lift leg independently and hold equal to or more than 3 seconds    Total Score 53    Berg comment: 53/56            [facility] Adult PT Treatment/Exercise - 11/21/19 0001      Knee/Hip Exercises: Stretches   Other Knee/Hip Stretches runners stretch with emphasis on hip flexor stretch x 15 sec x 2 reps each side.  L stretch at counter x 20 sec;  hip adductor stretch leaning arms on counter x 15 sec x 2 reps each leg.  reaching arms up cabinets for arm/low back stretch, then side bend x 5-10 sec x 3 reps each side           Balance Exercises - 11/21/19 0001      Balance Exercises: Standing   Standing Eyes Closed Narrow base of support (BOS);Foam/compliant surface;2 reps;20 secs    Tandem Stance Eyes open;Foam/compliant surface;2 reps;15 secs   with reciprocal arm swings and horiz head turns   SLS Eyes open;Solid surface;2 reps;10 secs    Wall Bumps Eyes opened;Shoulder;Hip;10 reps    Rockerboard Anterior/posterior;EO;20 seconds    Marching Foam/compliant surface;Static;Intermittent upper extremity assist;10 reps                  PT Long Term Goals - 11/21/19 0902      PT LONG TERM GOAL #1   Title Patient independent with HEP for balance and core strength    Time 8    Period Weeks    Status Achieved      PT LONG TERM GOAL #2   Title Improved BERG score to 55/56    Baseline 53/56    Time 8    Period Weeks    Status Not Met      PT LONG TERM GOAL #3   Title Improved 5x sit to stand to <= 12 seconds    Time 8    Period Weeks    Status Achieved      PT LONG TERM GOAL #4   Title Patient to demo improved right knee and hip flexion during swing phase of gait.    Baseline ----    Time 8    Period Weeks    Status Achieved      PT LONG TERM GOAL  #5   Title -----------  Plan - 11/21/19 0920    Clinical Impression Statement Pt scored 53/56 on Berg; improved turns, decreased ability with SLS.  Reviewed stretches and balance activities to perform at home.  Pt has partially met his goals and verbalized readiness to d/c to HEP.    Comorbidities CIDP, ACDF, lumbar discectomy, bil knee scopes, Paget's disease    Rehab Potential Good    PT Frequency 2x / week    PT Duration 8 weeks    PT Treatment/Interventions ADLs/Self Care Home Management;Aquatic Therapy;Gait training;Therapeutic activities;Therapeutic exercise;Balance training;Neuromuscular re-education;Manual techniques;Patient/family education    PT Next Visit Plan BERG, assess readiness to d/c.    PT Home Exercise Plan [id number]    Consulted and Agree with Plan of Care Patient           Patient will benefit from skilled therapeutic intervention in order to improve the following deficits and impairments:  Abnormal gait, Difficulty walking, Impaired flexibility, Decreased strength, Decreased balance  Visit Diagnosis: Muscle weakness (generalized)  Unsteadiness on feet  Other abnormalities of gait and mobility     Problem List Patient Active Problem List   Diagnosis Date Noted  . Lumbar radiculopathy 05/22/2019  . Spastic gait 03/07/2019  . Myalgia 11/01/2018  . Chronic bilateral low back pain without sciatica 07/21/2018  . 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 CIDP (chronic inflammatory demyelinating polyneuropathy) ([place]) 12/30/2017  . Edema 03/01/2017  . Hypokalemia   . Slow transit constipation   . Steroid-induced hyperglycemia   . Urinary retention   . Accidental drug overdose   . Postoperative pain   . Hyponatremia   . Cervical myelopathy ([place]) 02/01/2017  . Shortness of breath at rest   . Hypoglycemia   . Spondylosis, cervical, with myelopathy   . Neuropathic pain   . Benign essential HTN   . Labile blood glucose   . Muscle spasm    . GAD (generalized anxiety disorder)   . Acute inflammatory demyelinating polyneuropathy ([place]) 01/11/2017  . Anxiety state   . Neurogenic bladder   . Depression 12/30/2016  . Leg weakness, bilateral 12/30/2016  . 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 GBS (Guillain Barre syndrome) ([place])   . AIDP (acute inflammatory demyelinating polyneuropathy) ([place]) 11/27/2016  . Weakness 11/20/2016  . Weakness of both arms 10/30/2016  . 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 Obstructive sleep apnea 03/24/2016  . Numbness 03/18/2016  . Mild nonproliferative diabetic retinopathy of both eyes without macular edema associated with type 2 diabetes mellitus ([place]) 05/24/2015  . Nuclear cataract of both eyes 05/24/2015  . Diabetes mellitus without complication ([place]) 23/36/1224  . 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 Eustachian tube dysfunction 02/12/2015  . Acute maxillary sinusitis 02/12/2015  . Wellness examination 08/22/2014  . Alkaline phosphatase elevation 08/22/2014  . 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 Benign non-nodular prostatic hyperplasia with lower urinary tract symptoms 01/31/2014  . Lesion of eyelid 09/26/2013  . Screening for prostate cancer 04/24/2013  . Diabetes ([place]) 06/16/2012  . ED (erectile dysfunction) of organic origin 06/03/2012  . Routine general medical examination at a health care facility 04/10/2012  . BPH (benign prostatic hyperplasia) 02/03/2010  . HEMOCCULT POSITIVE STOOL 05/25/2008  . ELBOW PAIN, RIGHT 07/04/2007  . Dyslipidemia 02/16/2007  . ANXIETY 02/16/2007  . ERECTILE DYSFUNCTION 02/16/2007  . Essential hypertension 02/16/2007  . TESTICULAR MASS, LEFT 02/16/2007  . KNEE PAIN, CHRONIC 02/16/2007  . ADENOIDECTOMY, HX OF 02/16/2007   Kerin Perna, PTA 11/21/19 9:25 AM  [place], [place],  [address] Phone: [phone]   Fax:  [phone]  Name: KERRI ASCHE MRN: [id number] Date of Birth: [birth year]  PHYSICAL THERAPY DISCHARGE SUMMARY  Visits from Start of Care: 8  Current functional level related to goals / functional outcomes: See above   Remaining deficits: See above   Education / Equipment: HEP  Plan: Patient agrees to discharge.  Patient goals were partially met. Patient is being discharged due to being pleased with the current functional level.  ?????    Madelyn Flavors, PT 11/23/19 2:05 PM;  [facility] [place], [place] [address]  [phone] (office) [phone] (fax)

## 2019-11-22 ENCOUNTER — Other Ambulatory Visit (HOSPITAL_COMMUNITY): Payer: Self-pay

## 2019-11-23 ENCOUNTER — Ambulatory Visit (HOSPITAL_COMMUNITY)
Admission: RE | Admit: 2019-11-23 | Discharge: 2019-11-23 | Disposition: A | Discharge status: Home / Self Care | Payer: BC Managed Care – PPO | Source: Ambulatory Visit | Attending: Neurology | Admitting: Neurology

## 2019-11-23 ENCOUNTER — Other Ambulatory Visit: Payer: Self-pay

## 2019-11-23 DIAGNOSIS — G610 Guillain-Barre syndrome: Secondary | ICD-10-CM | POA: Insufficient documentation

## 2019-11-23 MED ORDER — IMMUNE GLOBULIN (HUMAN) 10 GM/100ML IV SOLN
1.0000 g/kg | INTRAVENOUS | Status: DC
  Administered 2019-11-23: 110 g via INTRAVENOUS
  Filled 2019-11-23: qty 1000
  Filled 2019-11-23: qty 1100

## 2019-11-24 ENCOUNTER — Other Ambulatory Visit: Payer: Self-pay

## 2019-11-24 ENCOUNTER — Encounter: Payer: Self-pay | Admitting: Endocrinology

## 2019-11-24 ENCOUNTER — Ambulatory Visit (HOSPITAL_COMMUNITY): Payer: BC Managed Care – PPO

## 2019-11-24 ENCOUNTER — Ambulatory Visit (INDEPENDENT_AMBULATORY_CARE_PROVIDER_SITE_OTHER): Payer: BC Managed Care – PPO | Admitting: Endocrinology

## 2019-11-24 VITALS — BP 144/82 | HR 81 | Ht 72.0 in | Wt 241.0 lb

## 2019-11-24 DIAGNOSIS — E119 Type 2 diabetes mellitus without complications: Secondary | ICD-10-CM | POA: Diagnosis not present

## 2019-11-24 DIAGNOSIS — R609 Edema, unspecified: Secondary | ICD-10-CM

## 2019-11-24 LAB — T4, FREE: Free T4: 0.72 ng/dL (ref 0.60–1.60)

## 2019-11-24 LAB — POCT GLYCOSYLATED HEMOGLOBIN (HGB A1C): Hemoglobin A1C: 8 % — AB (ref 4.0–5.6)

## 2019-11-24 LAB — BRAIN NATRIURETIC PEPTIDE: Pro B Natriuretic peptide (BNP): 40 pg/mL (ref 0.0–100.0)

## 2019-11-24 LAB — TSH: TSH: 2.94 u[IU]/mL (ref 0.35–4.50)

## 2019-11-24 NOTE — Patient Instructions (Addendum)
Your blood pressure is high today.  Please see your primary care provider soon, to have it rechecked Please take these pump settings: basal rate of 4.5 units/hr 6 AM-10 PM, and 2.6 units/hr 10 PM-6 AM mealtime bolus of 3 times a day (just before each meal) 5-5-9 units correction bolus (which some people call "sensitivity," or "insulin sensitivity ratio," or just "isr") of 1 unit for each 50 by which your glucose exceeds 100.  suspend the pump for 1-2 hrs, with activity.  If not, eat a light snack with it.   On this type of insulin schedule, you should eat meals on a regular schedule.  If a meal is missed or significantly delayed, your blood sugar could go low.  Please come back for a follow-up appointment in 2 months.

## 2019-11-24 NOTE — Progress Notes (Signed)
Subjective:    Patient ID: Kenneth Pace, male    DOB: 1950/05/14, 69 y.o.   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 Therapy: insulin since dx.  DKA: only at dx.   Severe hypoglycemia: most recently in 2014.  Pancreatitis: never Other: he has a medtronic 670 pump and continuous glucose monitor; he is retired. continuous glucose monitor ID is [email or web address] and password is [email or web address].  Interval history: he takes these pump settings:  basal rate of 4.5 units/hr 6 AM-10 PM, and 2.6 units/hr 10 PM-6 AM mealtime bolus of 3 times a day (just before each meal) 4-6-8 units correction bolus (which some people call "sensitivity," or "insulin sensitivity ratio," or just "isr") of 1 unit for each 50 by which your glucose exceeds 100.  suspend the pump for 1-2 hrs, with activity.  If not, eat a light snack with it.  We are unable to download pump and continuous glucose monitor today   No recent steroids.  He has mild hypoglycemia approx once per week.  This usually happens in the afternoon.  Otherwise, there is no trend throughout the day.   Pt states cbg's vary from 45-360.   Past Medical History:  Diagnosis Date  . ADENOIDECTOMY, HX OF 02/16/2007  . Anxiety    pt. states he does not have anxiety.  Marland Kitchen BENIGN PROSTATIC HYPERTROPHY, WITH OBSTRUCTION 02/03/2010  . 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 DIABETES MELLITUS, TYPE I, UNCONTROLLED 12/29/2006  . ERECTILE DYSFUNCTION 02/16/2007  . HYPERLIPIDEMIA 02/16/2007  . HYPERTENSION 02/16/2007  . Nerve pain    per patient "on lower back and both legs"  . Sleep apnea    uses CPAP  . TESTICULAR MASS, LEFT 02/16/2007    Past Surgical History:  Procedure Laterality Date  . ANTERIOR CERVICAL DECOMP/DISCECTOMY FUSION N/A 02/01/2017   Procedure: ANTERIOR CERVICAL DECOMPRESSION/DISCECTOMY FUSION CERVICAL THREE-FOUR , CERVICAL  FOUR-FIVE  CERVICAL FIVE-SIX;  Surgeon: Consuella Lose, MD;  Location: [facility];  Service: Neurosurgery;  Laterality: N/A;  . APPENDECTOMY    . COLONOSCOPY  02/05/2010   [facility]  . KNEE SURGERY     2 Lknee arthroscopy, 3 R knee arthroscpoy  . KNEE SURGERY Right 11/12/2016  . LUMBAR LAMINECTOMY/DECOMPRESSION MICRODISCECTOMY Right 05/22/2019   Procedure: LAMINOTOMY AND MICRODISCECTOMY RIGHT LUMBAR FOUR- LUMBAR FIVE;  Surgeon: Consuella Lose, MD;  Location: [facility];  Service: Neurosurgery;  Laterality: Right;  LAMINOTOMY AND MICRODISCECTOMY RIGHT LUMBAR FOUR- LUMBAR FIVE  . TONSILLECTOMY      Social History   Socioeconomic History  . Marital status: Married    Spouse name: Not on file  . Number of children: Not on file  . Years of education: Not on file  . Highest education level: Not on file  Occupational History  . Occupation: Lobbyist: [place]    Comment: [employer]  Tobacco Use  . Smoking status: Never Smoker  . Smokeless tobacco: Never Used  Vaping Use  . Vaping Use: Never used  Substance and Sexual Activity  . Alcohol use: No  . Drug use: No  . Sexual activity: Not on file  Other Topics Concern  . Not on file  Social History Narrative   He works for [employer]   He lives at home with wife.     Highest level of education:  BS, business admin  Right handed   Two story home   Social Determinants of Health   Financial Resource Strain:   . Difficulty of Paying Living Expenses: Not on file  Food Insecurity:   . Worried About Charity fundraiser in the Last Year: Not on file  . Ran Out of Food in the Last Year: Not on file  Transportation Needs:   . Lack of Transportation (Medical): Not on file  . Lack of Transportation (Non-Medical): Not on file  Physical Activity:   . Days of Exercise per Week: Not on file  . Minutes of Exercise per Session: Not on file  Stress:   . Feeling of Stress : Not on file  Social  Connections:   . Frequency of Communication with Friends and Family: Not on file  . Frequency of Social Gatherings with Friends and Family: Not on file  . Attends Religious Services: Not on file  . Active Member of Clubs or Organizations: Not on file  . Attends Archivist Meetings: Not on file  . Marital Status: Not on file  Intimate Partner Violence:   . Fear of Current or Ex-Partner: Not on file  . Emotionally Abused: Not on file  . Physically Abused: Not on file  . Sexually Abused: Not on file    Current Outpatient Medications on File Prior to Visit  Medication Sig Dispense Refill  . amitriptyline (ELAVIL) 75 MG tablet TAKE 1 TABLET(75 MG) BY MOUTH AT BEDTIME 90 tablet 1  . B Complex Vitamins (B-COMPLEX/B-12 PO) Take 1 tablet by mouth daily.     . baclofen (LIORESAL) 10 MG tablet Take 2 tablets (20 mg total) by mouth 3 (three) times daily. 540 tablet 3  . celecoxib (CELEBREX) 100 MG capsule TAKE 1 CAPSULE(100 MG) BY MOUTH AT BEDTIME 30 capsule 2  . clotrimazole-betamethasone (LOTRISONE) cream Apply 1 application topically 2 (two) times daily. 90 g 2  . Cranberry 400 MG TABS Take 400 mg by mouth daily.     . Cyanocobalamin (VITAMIN B-12 CR PO) Take 1 tablet by mouth daily.     . DULoxetine (CYMBALTA) 60 MG capsule TAKE 1 CAPSULE DAILY (Patient taking differently: Take 60 mg by mouth daily. ) 90 capsule 3  . finasteride (PROSCAR) 5 MG tablet Take 5 mg by mouth daily.     Marland Kitchen glucagon 1 MG injection Inject 1 mg into the vein once as needed. 1 each 12  . glucose blood (BAYER CONTOUR NEXT TEST) test strip MEDICALLY NECESSARY FOR USE WITH PUMP; Use to check blood sugar 5 times per day and prn; E11.42 500 each 3  . Immune Globulin 10% (IMMUNE GLOBULIN 10%) 10G/144mL (10,000mg /133mL) SOLN Inject 105 g into the vein every 6 (six) weeks for 6 doses. 289.5 mL 0  . Immune Globulin 10% (IMMUNE GLOBULIN 10%) 10G/138mL (10,000mg /121mL) SOLN Inject 105 g into the vein every 6 (six) weeks.  289.5 mL 10  . Insulin Infusion Pump Supplies (PARADIGM RESERVOIR 3ML) MISC 1 Device by Does not apply route every 3 (three) days. 30 each 3  . Insulin Infusion Pump Supplies (QUICK-SET INFUSION 43" 9MM) MISC 1 Device by Does not apply route every 3 (three) days. 30 each 3  . insulin lispro (HUMALOG) 100 UNIT/ML injection INJECT 120 UNITS DAILY IN PUMP 120 mL 3  . lidocaine (LIDODERM) 5 % At 7 am and remove at 7 pm. Apply 1 on each side. (Patient taking differently: Place 2 patches onto the skin daily as needed (pain). ) 180 patch  2  . lisinopril-hydrochlorothiazide (ZESTORETIC) 10-12.5 MG tablet TAKE 1 TABLET DAILY. PLEASE SEE PRIMARY CARE PHYSICIAN FOR ANY FUTURE REFILLS 90 tablet 3  . methocarbamol (ROBAXIN-750) 750 MG tablet Take 1 tablet (750 mg total) by mouth 3 (three) times daily as needed for muscle spasms. 90 tablet 1  . Multiple Vitamin (MULTIVITAMIN) capsule Take 1 capsule by mouth daily.     Marland Kitchen scopolamine (TRANSDERM-SCOP) 1 MG/3DAYS Place 1 patch (1.5 mg total) onto the skin every 3 (three) days. (Patient taking differently: Place 1 patch onto the skin daily as needed (dizziness). ) 10 patch 12   No current facility-administered medications on file prior to visit.    No Known Allergies  Family History  Problem Relation Age of Onset  . Dementia Mother   . Diabetes Mellitus I Mother   . Hypertension Father        64  . Healthy Sister   . Rheum arthritis Brother   . Cancer Neg Hx   . Colon cancer Neg Hx   . Esophageal cancer Neg Hx   . Stomach cancer Neg Hx   . Rectal cancer Neg Hx   . Colon polyps Neg Hx     BP (!) 144/82   Pulse 81   Ht 6' (1.829 m)   Wt 241 lb (109.3 kg)   SpO2 98%   BMI 32.69 kg/m    Review of Systems Denies sob.    Objective:   Physical Exam VITAL SIGNS:  See vs page GENERAL: no distress Pulses: dorsalis pedis intact bilat.   MSK: no deformity of the feet CV: 2+ bilat leg edema Skin:  no ulcer on the feet.  normal color and temp on the  feet.   Neuro: sensation is intact to touch on the feet.   Ext: there is bilateral onychomycosis of the toenails.    Lab Results  Component Value Date   HGBA1C 8.0 (A) 11/24/2019   Lab Results  Component Value Date   CREATININE 0.95 05/23/2019   BUN 20 05/23/2019   NA 137 05/23/2019   K 4.4 05/23/2019   CL 103 05/23/2019   CO2 24 05/23/2019        Assessment & Plan:  HTN: is noted today Type 1 DM: uncontrolled  Patient Instructions  Your blood pressure is high today.  Please see your primary care provider soon, to have it rechecked Please take these pump settings: basal rate of 4.5 units/hr 6 AM-10 PM, and 2.6 units/hr 10 PM-6 AM mealtime bolus of 3 times a day (just before each meal) 5-5-9 units correction bolus (which some people call "sensitivity," or "insulin sensitivity ratio," or just "isr") of 1 unit for each 50 by which your glucose exceeds 100.  suspend the pump for 1-2 hrs, with activity.  If not, eat a light snack with it.   On this type of insulin schedule, you should eat meals on a regular schedule.  If a meal is missed or significantly delayed, your blood sugar could go low.  Please come back for a follow-up appointment in 2 months.

## 2019-11-25 ENCOUNTER — Other Ambulatory Visit: Payer: Self-pay | Admitting: Physical Medicine & Rehabilitation

## 2019-12-01 ENCOUNTER — Other Ambulatory Visit: Payer: Self-pay

## 2019-12-01 ENCOUNTER — Ambulatory Visit (INDEPENDENT_AMBULATORY_CARE_PROVIDER_SITE_OTHER): Payer: BC Managed Care – PPO | Admitting: Neurology

## 2019-12-01 ENCOUNTER — Encounter: Payer: Self-pay | Admitting: Neurology

## 2019-12-01 VITALS — BP 167/77 | HR 82 | Ht 72.0 in | Wt 238.0 lb

## 2019-12-01 DIAGNOSIS — G6181 Chronic inflammatory demyelinating polyneuritis: Secondary | ICD-10-CM | POA: Diagnosis not present

## 2019-12-01 NOTE — Progress Notes (Signed)
Follow-up Visit   Date: 12/01/19   Kenneth Pace MRN: [id number] DOB: 31-Jan-1951   Interim History: Kenneth Pace is a 69 y.o. right-handed male with insulin-dependent diabetes mellitus, hypertension, cervical myelopathy s/p decompression at C3-C6, and s/p L4 laminectomy (05/2019) returning to the clinic for follow-up of CIDP  The patient was accompanied to the clinic by self.  He is retiring at the end of the year and hopes that he will be able to go to the gym and be more active to help his leg stiffness. He remains on balcofen 20mg  three times daily and is followed by PM&R for spasticity related to his cervical myelopathy. He has one fall last week, and did not injure himself.  He is compliant with using his cane.  He continues to have numbness tingling in the hands about a week prior to his next IVIG, which he gets every 6 weeks.  Overall, there has been no worsening and symptoms are stable.  He expresses some interest in trying to reduce the frequency to 7 weeks, but may hold off until the next year to do this.   He also has swelling in the ankles.  He is wearing compression stockings and elevating legs, which helps.    Medications:  Current Outpatient Medications on File Prior to Visit  Medication Sig Dispense Refill  . amitriptyline (ELAVIL) 75 MG tablet TAKE 1 TABLET(75 MG) BY MOUTH AT BEDTIME 30 tablet 2  . B Complex Vitamins (B-COMPLEX/B-12 PO) Take 1 tablet by mouth daily.     . baclofen (LIORESAL) 10 MG tablet Take 2 tablets (20 mg total) by mouth 3 (three) times daily. 540 tablet 3  . celecoxib (CELEBREX) 100 MG capsule TAKE 1 CAPSULE(100 MG) BY MOUTH AT BEDTIME 30 capsule 2  . clotrimazole-betamethasone (LOTRISONE) cream Apply 1 application topically 2 (two) times daily. 90 g 2  . Cranberry 400 MG TABS Take 400 mg by mouth daily.     . Cyanocobalamin (VITAMIN B-12 CR PO) Take 1 tablet by mouth daily.     . DULoxetine (CYMBALTA) 60 MG capsule TAKE 1 CAPSULE  DAILY (Patient taking differently: Take 60 mg by mouth daily. ) 90 capsule 3  . finasteride (PROSCAR) 5 MG tablet Take 5 mg by mouth daily.     Marland Kitchen glucagon 1 MG injection Inject 1 mg into the vein once as needed. 1 each 12  . glucose blood (BAYER CONTOUR NEXT TEST) test strip MEDICALLY NECESSARY FOR USE WITH PUMP; Use to check blood sugar 5 times per day and prn; E11.42 500 each 3  . Immune Globulin 10% (IMMUNE GLOBULIN 10%) 10G/163mL (10,000mg /155mL) SOLN Inject 105 g into the vein every 6 (six) weeks for 6 doses. 289.5 mL 0  . Immune Globulin 10% (IMMUNE GLOBULIN 10%) 10G/153mL (10,000mg /18mL) SOLN Inject 105 g into the vein every 6 (six) weeks. 289.5 mL 10  . Insulin Infusion Pump Supplies (PARADIGM RESERVOIR 3ML) MISC 1 Device by Does not apply route every 3 (three) days. 30 each 3  . Insulin Infusion Pump Supplies (QUICK-SET INFUSION 43" 9MM) MISC 1 Device by Does not apply route every 3 (three) days. 30 each 3  . insulin lispro (HUMALOG) 100 UNIT/ML injection INJECT 120 UNITS DAILY IN PUMP 120 mL 3  . lidocaine (LIDODERM) 5 % At 7 am and remove at 7 pm. Apply 1 on each side. (Patient taking differently: Place 2 patches onto the skin daily as needed (pain). ) 180 patch 2  . lisinopril-hydrochlorothiazide (ZESTORETIC)  10-12.5 MG tablet TAKE 1 TABLET DAILY. PLEASE SEE PRIMARY CARE PHYSICIAN FOR ANY FUTURE REFILLS 90 tablet 3  . methocarbamol (ROBAXIN-750) 750 MG tablet Take 1 tablet (750 mg total) by mouth 3 (three) times daily as needed for muscle spasms. 90 tablet 1  . Multiple Vitamin (MULTIVITAMIN) capsule Take 1 capsule by mouth daily.     Marland Kitchen scopolamine (TRANSDERM-SCOP) 1 MG/3DAYS Place 1 patch (1.5 mg total) onto the skin every 3 (three) days. (Patient taking differently: Place 1 patch onto the skin daily as needed (dizziness). ) 10 patch 12   No current facility-administered medications on file prior to visit.    Allergies: No Known Allergies    Vital Signs:  BP (!) 167/77   Pulse  82   Ht 6' (1.829 m)   Wt 238 lb (108 kg)   SpO2 98%   BMI 32.28 kg/m   Neurological Exam: MENTAL STATUS including orientation to time, place, person, recent and remote memory, attention span and concentration, language, and fund of knowledge is normal.  Speech is not dysarthric.  CRANIAL NERVES: Normal conjugate, extra-ocular eye movements in all directions of gaze.  No ptosis.   MOTOR:  There is moderate right FDI and ADM atrophy.  No fasciculations or abnormal movements.  No pronator drift.    Right Upper Extremity:    Left Upper Extremity:    Deltoid  5/5   Deltoid  5/5   Biceps  5/5   Biceps  5/5   Triceps  5/5   Triceps  5/5   Wrist extensors  5/5   Wrist extensors  5/5   Wrist flexors  5/5   Wrist flexors  5/5   Finger extensors  5/5   Finger extensors  5/5   Finger flexors  5/5   Finger flexors  5/5   Dorsal interossei  5-/5   Dorsal interossei  5/5   Abductor pollicis  5/5   Abductor pollicis  5/5   Tone (Ashworth scale)  0  Tone (Ashworth scale)  0   Right Lower Extremity:    Left Lower Extremity:    Hip flexors  5/5   Hip flexors  5/5   Hip extensors  5/5   Hip extensors  5/5   Knee flexors  5/5   Knee flexors  5/5   Knee extensors  5/5   Knee extensors  5/5   Dorsiflexors  5/5   Dorsiflexors  5/5   Plantarflexors  5/5   Plantarflexors  5/5   Toe extensors  5/5   Toe extensors  5/5   Toe flexors  5/5   Toe flexors  5/5   Tone (Ashworth scale)  0+  Tone (Ashworth scale)  0+    MSRs:  Right                                                                 Left brachioradialis 2+  brachioradialis 2+  biceps 2+  biceps 2+  triceps 2+  triceps 2+  patellar 3+  patellar 3+  ankle jerk 2+  ankle jerk 2+  Hoffman no  Hoffman no  plantar response down  plantar response down   SENSORY:  Vibration 100% at the knees, and 60% distal to ankles.    COORDINATION/GAIT:  Gait is spastic, assisted with cane. =  Data: MRI cervical and lumbar spine 11/20/2016: 1. No focal  cord signal abnormality or lesion. 2. Multilevel spondylosis of the cervical spine as described. 3. Moderate central and mild bilateral foraminal stenosis at C3-4. 4. Moderate central and bilateral foraminal narrowing at C4-5. 5. Moderate central and severe bilateral foraminal stenosis at C5-6. 6. Moderate foraminal narrowing bilaterally at C6-7 without significant central canal stenosis. 7. Short pedicles in the cervical and lumbar spine contribute to the stenosis. 8. Disc bulging at L2-3 and L3-4 without significant stenosis. 9. Disc bulging and facet hypertrophy at L4-5 with mild subarticular and foraminal stenosis bilaterally. 10. Mild left foraminal narrowing at L5-S1 secondary to asymmetric facet hypertrophy.  MRI lumbar spine wo contrast 05/07/2019:  1. New large right paracentral L4-5 disc extrusion with cranial extension within the subarticular recess above the L4-5 level and impinging the right L4 nerve root. 2. Mild progression of lumbar spondylosis at L3-L4 where there is mild left foraminal stenosis at L3-4. 3. Mild to moderate bilateral foraminal stenosis at L4-L5. 4. No canal stenosis at any level.  CSF 11/20/2016:  R1 W3 P81* G102*  No OCB, VDRL neg Labs 11/20/2016:  SPEP with IFE no M protein, 486, TSH 1.051  Lab Results  Component Value Date   HGBA1C 8.0 (A) 11/24/2019   MRI cervical spine 12/31/2016:   1. No enlargement of the cranial nerves or cervical nerve roots. 2. No acute intracranial abnormality. 3. Multilevel moderate cervical spinal canal stenosis, worst at C3-4 and C5-6 with associated mild cord deformity but no cord signal change. 4. Moderate to severe neural foraminal stenosis at C3-4, C4-5 and C5-6.  NCS/EMG of the right upper and lower extremities 04/27/2017: 1. 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  When compared to his previous study on 11/10/2016, there is mild  interval improvement.  2. Chronic C6 radiculopathy affecting the right upper extremity, mild in degree electrically.  3. There is no evidence of a sensorimotor polyneuropathy or lumbosacral radiculopathy affecting the right lower extremity.    IMPRESSION/PLAN: 1.  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 Stable with residual paresthesias in the hands and egs  - Continue IVIG 1g/kg every 6 weeks  - Consider reducing frequency to 7 weeks next year, patient will notify my office if he chooses to do home infusions  2.  Cervical myelopathy s/p decompression at C3-6 with residual spasticity  - Continue baclofen 20mg  three times daily  - Continue to use cane to minimize risk of falls  3.  Right L4 laminectomy s./p decompression by Dr. Kathyrn Sheriff  4.  Chronic pain from neuropathy and myelopathy, followed by Dr. Delice Lesch   Return to clinic in 4 months  Total time spent reviewing records, interview, history/exam, documentation, and coordination of care on day of encounter:  20 min     Thank you for allowing me to participate in patient's care.  If I can answer any additional questions, I would be pleased to do so.    Sincerely,    Snigdha Howser K. Posey Pronto, DO

## 2019-12-01 NOTE — Patient Instructions (Signed)
Return to clinic 4 months

## 2019-12-22 DIAGNOSIS — M170 Bilateral primary osteoarthritis of knee: Secondary | ICD-10-CM | POA: Diagnosis not present

## 2019-12-26 ENCOUNTER — Telehealth: Payer: Self-pay | Admitting: Endocrinology

## 2019-12-26 NOTE — Telephone Encounter (Signed)
Patient's wife [name] requests to be called at ph# [phone] re: issues with pump supllies

## 2019-12-26 NOTE — Telephone Encounter (Signed)
Patient's wife is having difficulty with billing for pump sensors and pump starter kit. [employer] is not paying for services.  She was requesting MD provider NPI number and billing code numbers for sensors and starter kit.   NPI number given, but called [employer] rep. For billing codes.  She took patient's name to call her with this information.   PT. Satisfied with this, and had no final questions.

## 2020-01-04 ENCOUNTER — Ambulatory Visit (HOSPITAL_COMMUNITY)
Admission: RE | Admit: 2020-01-04 | Discharge: 2020-01-04 | Disposition: A | Discharge status: Home / Self Care | Payer: BC Managed Care – PPO | Source: Ambulatory Visit | Attending: Neurology | Admitting: Neurology

## 2020-01-04 ENCOUNTER — Other Ambulatory Visit: Payer: Self-pay

## 2020-01-04 DIAGNOSIS — G6181 Chronic inflammatory demyelinating polyneuritis: Secondary | ICD-10-CM | POA: Insufficient documentation

## 2020-01-04 MED ORDER — IMMUNE GLOBULIN (HUMAN) 10 GM/100ML IV SOLN
1.0000 g/kg | INTRAVENOUS | Status: DC
  Administered 2020-01-04: 110 g via INTRAVENOUS
  Filled 2020-01-04: qty 1000

## 2020-01-05 ENCOUNTER — Telehealth: Payer: Self-pay | Admitting: Neurology

## 2020-01-05 NOTE — Telephone Encounter (Signed)
Patient states that he needs a new RX for the IVIG infusions he has them done at the [facility]

## 2020-01-08 DIAGNOSIS — G4733 Obstructive sleep apnea (adult) (pediatric): Secondary | ICD-10-CM | POA: Diagnosis not present

## 2020-01-08 NOTE — Telephone Encounter (Signed)
Please send order for IVIG 1g/kg every 6 weeks x 6 cycles.

## 2020-01-09 ENCOUNTER — Other Ambulatory Visit: Payer: Self-pay

## 2020-01-09 NOTE — Telephone Encounter (Signed)
Called patient and spoke to patients wife and informed her that new orders were sent in for patients infusion. Patients wife stated she will call them in a few hours to schedule his infusion.

## 2020-01-12 DIAGNOSIS — Z20822 Contact with and (suspected) exposure to covid-19: Secondary | ICD-10-CM | POA: Diagnosis not present

## 2020-01-22 ENCOUNTER — Emergency Department (HOSPITAL_COMMUNITY): Payer: BC Managed Care – PPO

## 2020-01-22 ENCOUNTER — Emergency Department (HOSPITAL_COMMUNITY)
Admission: EM | Admit: 2020-01-22 | Discharge: 2020-01-22 | Disposition: A | Discharge status: Home / Self Care | Payer: BC Managed Care – PPO | Attending: Emergency Medicine | Admitting: Emergency Medicine

## 2020-01-22 ENCOUNTER — Other Ambulatory Visit: Payer: Self-pay

## 2020-01-22 DIAGNOSIS — Z9641 Presence of insulin pump (external) (internal): Secondary | ICD-10-CM | POA: Diagnosis not present

## 2020-01-22 DIAGNOSIS — I10 Essential (primary) hypertension: Secondary | ICD-10-CM | POA: Diagnosis not present

## 2020-01-22 DIAGNOSIS — M545 Low back pain, unspecified: Secondary | ICD-10-CM | POA: Diagnosis not present

## 2020-01-22 DIAGNOSIS — M5441 Lumbago with sciatica, right side: Secondary | ICD-10-CM | POA: Diagnosis not present

## 2020-01-22 DIAGNOSIS — Z79899 Other long term (current) drug therapy: Secondary | ICD-10-CM | POA: Insufficient documentation

## 2020-01-22 DIAGNOSIS — M5431 Sciatica, right side: Secondary | ICD-10-CM | POA: Insufficient documentation

## 2020-01-22 DIAGNOSIS — E113293 Type 2 diabetes mellitus with mild nonproliferative diabetic retinopathy without macular edema, bilateral: Secondary | ICD-10-CM | POA: Diagnosis not present

## 2020-01-22 LAB — CBG MONITORING, ED: Glucose-Capillary: 190 mg/dL — ABNORMAL HIGH (ref 70–99)

## 2020-01-22 MED ORDER — PREDNISONE 10 MG (21) PO TBPK: ORAL_TABLET | ORAL | 0 refills | Status: AC

## 2020-01-22 MED ORDER — HYDROCODONE-ACETAMINOPHEN 5-325 MG PO TABS: 1.0000 | ORAL_TABLET | Freq: Four times a day (QID) | ORAL | 0 refills | Status: DC | PRN

## 2020-01-22 MED ORDER — HYDROMORPHONE HCL 1 MG/ML IJ SOLN
1.0000 mg | Freq: Once | INTRAMUSCULAR | Status: AC
  Administered 2020-01-22: 1 mg via INTRAMUSCULAR
  Filled 2020-01-22: qty 1

## 2020-01-22 NOTE — ED Triage Notes (Signed)
Pt here with c/o low back pain  Radiates down the right leg , pt has had  Surgery to the lower back in the past

## 2020-01-22 NOTE — ED Provider Notes (Signed)
[facility] EMERGENCY DEPARTMENT Provider Note   CSN: [id number] Arrival date & time: 01/22/20  5277     History Chief complaint: Back pain  Kenneth Pace is a 69 y.o. male.  HPI   Patient presents to the ED for evaluation of back pain.  Patient states he started having pain in his back again last week.  Pain was on the right lower back radiating down his leg.  Patient states the pain is sharp.  He denies any numbness or weakness.  He is having increased difficulty walking.  Last night he had to lie on the floor.  He was having severe pain and took some leftover pain medications that he had around the house.  Patient states it is a little better this morning but still very painful.  He had a hard time walking although was able to get to the car and his wife drove him into the ED patient states he does have a history of prior back problems.  He had a prior laminectomy and microdiscectomy in April of this year by Dr. Kathyrn Sheriff.  He also had a cervical disc surgery back in 2018  Past Medical History:  Diagnosis Date  . ADENOIDECTOMY, HX OF 02/16/2007  . Anxiety    pt. states he does not have anxiety.  Marland Kitchen BENIGN PROSTATIC HYPERTROPHY, WITH OBSTRUCTION 02/03/2010  . 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 DIABETES MELLITUS, TYPE I, UNCONTROLLED 12/29/2006  . ERECTILE DYSFUNCTION 02/16/2007  . HYPERLIPIDEMIA 02/16/2007  . HYPERTENSION 02/16/2007  . Nerve pain    per patient "on lower back and both legs"  . Sleep apnea    uses CPAP  . TESTICULAR MASS, LEFT 02/16/2007    Patient Active Problem List   Diagnosis Date Noted  . Lumbar radiculopathy 05/22/2019  . Spastic gait 03/07/2019  . Myalgia 11/01/2018  . Chronic bilateral low back pain without sciatica 07/21/2018  . 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 CIDP (chronic inflammatory demyelinating polyneuropathy) ([place]) 12/30/2017  . Edema 03/01/2017  . Hypokalemia   .  Slow transit constipation   . Steroid-induced hyperglycemia   . Urinary retention   . Accidental drug overdose   . Postoperative pain   . Hyponatremia   . Cervical myelopathy ([place]) 02/01/2017  . Shortness of breath at rest   . Hypoglycemia   . Spondylosis, cervical, with myelopathy   . Neuropathic pain   . Benign essential HTN   . Labile blood glucose   . Muscle spasm   . GAD (generalized anxiety disorder)   . Acute inflammatory demyelinating polyneuropathy ([place]) 01/11/2017  . Anxiety state   . Neurogenic bladder   . Depression 12/30/2016  . Leg weakness, bilateral 12/30/2016  . 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 AIDP (acute inflammatory demyelinating polyneuropathy) ([place]) 11/27/2016  . Weakness 11/20/2016  . Weakness of both arms 10/30/2016  . 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 Obstructive sleep apnea 03/24/2016  . Numbness 03/18/2016  . Mild nonproliferative diabetic retinopathy of both eyes without macular edema associated with type 2 diabetes mellitus ([place]) 05/24/2015  . Nuclear cataract of both eyes 05/24/2015  . Diabetes mellitus without complication ([place]) 82/42/3536  . 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 Eustachian tube dysfunction 02/12/2015  . Acute maxillary sinusitis 02/12/2015  . Wellness examination 08/22/2014  . Alkaline phosphatase elevation 08/22/2014  . 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  Benign non-nodular prostatic hyperplasia with lower urinary tract symptoms 01/31/2014  . Lesion of eyelid 09/26/2013  . Screening for prostate cancer 04/24/2013  . Diabetes ([place]) 06/16/2012  . ED (erectile dysfunction) of organic origin 06/03/2012  . Routine general medical examination at a health care facility 04/10/2012  . BPH (benign prostatic hyperplasia) 02/03/2010  . HEMOCCULT POSITIVE STOOL 05/25/2008  . ELBOW PAIN, RIGHT 07/04/2007  .  Dyslipidemia 02/16/2007  . ANXIETY 02/16/2007  . ERECTILE DYSFUNCTION 02/16/2007  . Essential hypertension 02/16/2007  . TESTICULAR MASS, LEFT 02/16/2007  . KNEE PAIN, CHRONIC 02/16/2007  . ADENOIDECTOMY, HX OF 02/16/2007    Past Surgical History:  Procedure Laterality Date  . ANTERIOR CERVICAL DECOMP/DISCECTOMY FUSION N/A 02/01/2017   Procedure: ANTERIOR CERVICAL DECOMPRESSION/DISCECTOMY FUSION CERVICAL THREE-FOUR , CERVICAL FOUR-FIVE  CERVICAL FIVE-SIX;  Surgeon: Consuella Lose, MD;  Location: [place];  Service: Neurosurgery;  Laterality: N/A;  . APPENDECTOMY    . COLONOSCOPY  02/05/2010   [facility]  . KNEE SURGERY     2 Lknee arthroscopy, 3 R knee arthroscpoy  . KNEE SURGERY Right 11/12/2016  . LUMBAR LAMINECTOMY/DECOMPRESSION MICRODISCECTOMY Right 05/22/2019   Procedure: LAMINOTOMY AND MICRODISCECTOMY RIGHT LUMBAR FOUR- LUMBAR FIVE;  Surgeon: Consuella Lose, MD;  Location: [place];  Service: Neurosurgery;  Laterality: Right;  LAMINOTOMY AND MICRODISCECTOMY RIGHT LUMBAR FOUR- LUMBAR FIVE  . TONSILLECTOMY         Family History  Problem Relation Age of Onset  . Dementia Mother   . Diabetes Mellitus I Mother   . Hypertension Father        73  . Healthy Sister   . Rheum arthritis Brother   . Cancer Neg Hx   . Colon cancer Neg Hx   . Esophageal cancer Neg Hx   . Stomach cancer Neg Hx   . Rectal cancer Neg Hx   . Colon polyps Neg Hx     Social History   Tobacco Use  . Smoking status: Never Smoker  . Smokeless tobacco: Never Used  Vaping Use  . Vaping Use: Never used  Substance Use Topics  . Alcohol use: No  . Drug use: No    Home Medications Prior to Admission medications   Medication Sig Start Date End Date Taking? Authorizing Provider  amitriptyline (ELAVIL) 75 MG tablet TAKE 1 TABLET(75 MG) BY MOUTH AT BEDTIME 11/27/19   Jamse Arn, MD  B Complex Vitamins (B-COMPLEX/B-12 PO) Take 1 tablet by mouth daily.     [provider]  baclofen  (LIORESAL) 10 MG tablet Take 2 tablets (20 mg total) by mouth 3 (three) times daily. 05/29/19   Narda Amber K, DO  celecoxib (CELEBREX) 100 MG capsule TAKE 1 CAPSULE(100 MG) BY MOUTH AT BEDTIME 03/07/19   Jamse Arn, MD  clotrimazole-betamethasone (LOTRISONE) cream Apply 1 application topically 2 (two) times daily. 11/02/18   Renato Shin, MD  Cranberry 400 MG TABS Take 400 mg by mouth daily.     [provider]  Cyanocobalamin (VITAMIN B-12 CR PO) Take 1 tablet by mouth daily.     [provider]  DULoxetine (CYMBALTA) 60 MG capsule TAKE 1 CAPSULE DAILY Patient taking differently: Take 60 mg by mouth daily.  04/04/19   Jamse Arn, MD  finasteride (PROSCAR) 5 MG tablet Take 5 mg by mouth daily.     [provider]  glucagon 1 MG injection Inject 1 mg into the vein once as needed. 07/25/13   Renato Shin, MD  glucose blood (BAYER  CONTOUR NEXT TEST) test strip MEDICALLY NECESSARY FOR USE WITH PUMP; Use to check blood sugar 5 times per day and prn; E11.42 02/03/18   Renato Shin, MD  HYDROcodone-acetaminophen (NORCO/VICODIN) 5-325 MG tablet Take 1-2 tablets by mouth every 6 (six) hours as needed. 01/22/20   Dorie Rank, MD  Immune Globulin 10% (IMMUNE GLOBULIN 10%) 10G/154mL (10,000mg /146mL) SOLN Inject 105 g into the vein every 6 (six) weeks. 07/20/19   Patel, Arvin Collard K, DO  Insulin Infusion Pump Supplies (PARADIGM RESERVOIR 3ML) MISC 1 Device by Does not apply route every 3 (three) days. 08/31/18   Renato Shin, MD  Insulin Infusion Pump Supplies (QUICK-SET INFUSION 43" 9MM) MISC 1 Device by Does not apply route every 3 (three) days. 08/31/18   Renato Shin, MD  insulin lispro (HUMALOG) 100 UNIT/ML injection INJECT 120 UNITS DAILY IN PUMP 10/23/19   Renato Shin, MD  lidocaine (LIDODERM) 5 % At 7 am and remove at 7 pm. Apply 1 on each side. Patient taking differently: Place 2 patches onto the skin daily as needed (pain).  04/25/19   Jamse Arn, MD    lisinopril-hydrochlorothiazide (ZESTORETIC) 10-12.5 MG tablet TAKE 1 TABLET DAILY. PLEASE SEE PRIMARY CARE PHYSICIAN FOR ANY FUTURE REFILLS 06/02/19   Shelda Pal, DO  methocarbamol (ROBAXIN-750) 750 MG tablet Take 1 tablet (750 mg total) by mouth 3 (three) times daily as needed for muscle spasms. 05/23/19   Costella, Vista Mink, PA-C  Multiple Vitamin (MULTIVITAMIN) capsule Take 1 capsule by mouth daily.     [provider]  predniSONE (STERAPRED UNI-PAK 21 TAB) 10 MG (21) TBPK tablet Take 4 tablets (40 mg total) by mouth daily for 2 days, THEN 3 tablets (30 mg total) daily for 2 days, THEN 2 tablets (20 mg total) daily for 2 days, THEN 1 tablet (10 mg total) daily for 2 days. 01/22/20 01/30/20  Dorie Rank, MD  scopolamine (TRANSDERM-SCOP) 1 MG/3DAYS Place 1 patch (1.5 mg total) onto the skin every 3 (three) days. Patient taking differently: Place 1 patch onto the skin daily as needed (dizziness).  03/24/19   Shelda Pal, DO    Allergies    Patient has no known allergies.  Review of Systems   Review of Systems  All other systems reviewed and are negative.   Physical Exam Updated Vital Signs BP 120/79 (BP Location: Right Arm)   Pulse 98   Temp 98 F (36.7 C) (Oral)   Resp 18   Ht 1.829 m (6')   Wt 106.6 kg   SpO2 98%   BMI 31.87 kg/m   Physical Exam Vitals and nursing note reviewed.  Constitutional:      General: He is not in acute distress.    Appearance: He is well-developed.  HENT:     Head: Normocephalic and atraumatic.     Right Ear: External ear normal.     Left Ear: External ear normal.  Eyes:     General: No scleral icterus.       Right eye: No discharge.        Left eye: No discharge.     Conjunctiva/sclera: Conjunctivae normal.  Neck:     Trachea: No tracheal deviation.  Cardiovascular:     Rate and Rhythm: Normal rate.  Pulmonary:     Effort: Pulmonary effort is normal. No respiratory distress.     Breath sounds: No stridor.   Abdominal:     General: There is no distension.  Musculoskeletal:  General: No swelling or deformity.     Cervical back: Neck supple.     Comments: Mild tenderness palpation paraspinal lumbar region, no swelling or deformity, no tenderness palpation of the hip, no swelling of the extremity  Skin:    General: Skin is warm and dry.     Findings: No rash.  Neurological:     Mental Status: He is alert.     Cranial Nerves: Cranial nerve deficit: no gross deficits.     Comments: Normal strength and sensation bilateral lower extremities, 5 out of 5 plantar dorsiflexion, normal leg extension and flexion     ED Results / Procedures / Treatments   Labs (all labs ordered are listed, but only abnormal results are displayed) Labs Reviewed  CBG MONITORING, ED - Abnormal; Notable for the following components:      Result Value   Glucose-Capillary 190 (*)    All other components within normal limits    EKG None  Radiology DG Lumbar Spine Complete  Result Date: 01/22/2020 CLINICAL DATA:  Back pain, right radiculopathy EXAM: LUMBAR SPINE - COMPLETE 4+ VIEW COMPARISON:  05/22/2019 FINDINGS: There are 5 lumbar type vertebral bodies. No evidence of pars break. Stable vertebral body heights and alignment. Disc space narrowing remains greatest at L4-L5. Small endplate osteophytes are present. Mild facet hypertrophy. IMPRESSION: No acute abnormality. No acute osseous abnormality. Similar appearance of multilevel degenerative changes. Electronically Signed   By: Macy Mis M.D.   On: 01/22/2020 11:29    Procedures Procedures (including critical care time)  Medications Ordered in ED Medications  HYDROmorphone (DILAUDID) injection 1 mg (1 mg Intramuscular Given 01/22/20 1130)    ED Course  I have reviewed the triage vital signs and the nursing notes.  Pertinent labs & imaging results that were available during my care of the patient were reviewed by me and considered in my medical  decision making (see chart for details).    MDM Rules/Calculators/A&P                          Patient presented to the ED with complaints of back pain.  Symptoms suggestive of sciatica.  No findings to suggest infection.  Patient does not have any acute neurologic dysfunction.  Patient improved with a dose of pain medications.  X-rays do not show any signs of acute abnormalities.  We will plan on discharge home with prescription for hydrocodone as well as short course of prednisone.  Patient does know to monitor his blood sugars.  Outpatient follow-up with his spine surgeon discussed. Final Clinical Impression(s) / ED Diagnoses Final diagnoses:  Sciatica of right side    Rx / DC Orders ED Discharge Orders         Ordered    HYDROcodone-acetaminophen (NORCO/VICODIN) 5-325 MG tablet  Every 6 hours PRN        01/22/20 1201    predniSONE (STERAPRED UNI-PAK 21 TAB) 10 MG (21) TBPK tablet        01/22/20 1201           Dorie Rank, MD 01/22/20 1203

## 2020-01-22 NOTE — Discharge Instructions (Signed)
Take the medications as needed for pain.  Also take the steroids to help with the sciatica.  Monitor your blood sugars.  Follow-up with your spine surgeon Dr. Kathyrn Sheriff for further evaluation.

## 2020-01-24 ENCOUNTER — Ambulatory Visit (INDEPENDENT_AMBULATORY_CARE_PROVIDER_SITE_OTHER): Payer: BC Managed Care – PPO | Admitting: Endocrinology

## 2020-01-24 ENCOUNTER — Encounter: Payer: Self-pay | Admitting: Endocrinology

## 2020-01-24 ENCOUNTER — Other Ambulatory Visit: Payer: Self-pay

## 2020-01-24 VITALS — BP 154/82 | HR 86 | Ht 72.0 in | Wt 234.2 lb

## 2020-01-24 DIAGNOSIS — M545 Low back pain, unspecified: Secondary | ICD-10-CM | POA: Diagnosis not present

## 2020-01-24 DIAGNOSIS — M25561 Pain in right knee: Secondary | ICD-10-CM | POA: Diagnosis not present

## 2020-01-24 DIAGNOSIS — E1042 Type 1 diabetes mellitus with diabetic polyneuropathy: Secondary | ICD-10-CM

## 2020-01-24 DIAGNOSIS — M1712 Unilateral primary osteoarthritis, left knee: Secondary | ICD-10-CM | POA: Diagnosis not present

## 2020-01-24 LAB — POCT GLYCOSYLATED HEMOGLOBIN (HGB A1C): Hemoglobin A1C: 8.1 % — AB (ref 4.0–5.6)

## 2020-01-24 NOTE — Patient Instructions (Addendum)
Your blood pressure is high today.  Please see your primary care provider soon, to have it rechecked Please take these pump settings:  basal rate of 4.5 units/hr 6 AM-10 PM, and 2.6 units/hr 10 PM-6 AM (when not in auto mode). Mealtime bolus of 1 unit/7 grams cardohydrate correction bolus (which some people call "sensitivity," or "insulin sensitivity ratio," or just "isr") of 1 unit for each 50 by which your glucose exceeds 100.  suspend the pump for 1-2 hrs, with activity.  If not, eat a light snack with it.   On this type of insulin schedule, you should eat meals on a regular schedule.  If a meal is missed or significantly delayed, your blood sugar could go low.  Please come back for a follow-up appointment in 2 months.

## 2020-01-24 NOTE — Progress Notes (Signed)
Subjective:    Patient ID: DOMINIQUE CALVEY, male    DOB: June 15, 1950, 69 y.o.   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 Therapy: insulin since dx.  DKA: only at dx.   Severe hypoglycemia: most recently in 2014.  Pancreatitis: never Other: he has a medtronic 670 pump and continuous glucose monitor; he is retired. continuous glucose monitor ID is [email or web address] and password is [email or web address].   Interval history: he takes these pump settings:  basal rate of 4.5 units/hr 6 AM-10 PM, and 2.6 units/hr 10 PM-6 AM mealtime bolus of 1 unit/10 grams CHO correction bolus (which some people call "sensitivity," or "insulin sensitivity ratio," or just "isr") of 1 unit for each 30 by which your glucose exceeds 100.   suspend the pump for 1-2 hrs, with activity.  If not, eat a light snack with it.   TDD is 100 units (77% basal) He received steroid injection 1 month ago, for knee pain.  I reviewed continuous glucose monitor data.  Glucose varies from 50-300.  It is in general higher PC than AC.  He has mild hypoglycemia approx once per week.  This happens in the middles of the night.  Pt says pump will sometimes not give the full bolus amount.  Past Medical History:  Diagnosis Date  . ADENOIDECTOMY, HX OF 02/16/2007  . Anxiety    pt. states he does not have anxiety.  Marland Kitchen BENIGN PROSTATIC HYPERTROPHY, WITH OBSTRUCTION 02/03/2010  . 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)    diagnosed 11/2016  . DIABETES MELLITUS, TYPE I, UNCONTROLLED 12/29/2006  . ERECTILE DYSFUNCTION 02/16/2007  . HYPERLIPIDEMIA 02/16/2007  . HYPERTENSION 02/16/2007  . Nerve pain    per patient "on lower back and both legs"  . Sleep apnea    uses CPAP  . TESTICULAR MASS, LEFT 02/16/2007    Past Surgical History:  Procedure Laterality Date  . ANTERIOR CERVICAL DECOMP/DISCECTOMY FUSION N/A 02/01/2017   Procedure: ANTERIOR CERVICAL  DECOMPRESSION/DISCECTOMY FUSION CERVICAL THREE-FOUR , CERVICAL FOUR-FIVE  CERVICAL FIVE-SIX;  Surgeon: Consuella Lose, MD;  Location: [place];  Service: Neurosurgery;  Laterality: N/A;  . APPENDECTOMY    . COLONOSCOPY  02/05/2010   [facility]  . KNEE SURGERY     2 Lknee arthroscopy, 3 R knee arthroscpoy  . KNEE SURGERY Right 11/12/2016  . LUMBAR LAMINECTOMY/DECOMPRESSION MICRODISCECTOMY Right 05/22/2019   Procedure: LAMINOTOMY AND MICRODISCECTOMY RIGHT LUMBAR FOUR- LUMBAR FIVE;  Surgeon: Consuella Lose, MD;  Location: [place];  Service: Neurosurgery;  Laterality: Right;  LAMINOTOMY AND MICRODISCECTOMY RIGHT LUMBAR FOUR- LUMBAR FIVE  . TONSILLECTOMY      Social History   Socioeconomic History  . Marital status: Married    Spouse name: Not on file  . Number of children: Not on file  . Years of education: Not on file  . Highest education level: Not on file  Occupational History  . Occupation: Lobbyist: [place]    Comment: [employer]  Tobacco Use  . Smoking status: Never Smoker  . Smokeless tobacco: Never Used  Vaping Use  . Vaping Use: Never used  Substance and Sexual Activity  . Alcohol use: No  . Drug use: No  . Sexual activity: Not on file  Other Topics Concern  . Not on file  Social History Narrative   He works for [employer]   He lives at home  with wife.     Highest level of education:  BS, business admin   Right handed   Two story home   Social Determinants of Health   Financial Resource Strain: Not on file  Food Insecurity: Not on file  Transportation Needs: Not on file  Physical Activity: Not on file  Stress: Not on file  Social Connections: Not on file  Intimate Partner Violence: Not on file    Current Outpatient Medications on File Prior to Visit  Medication Sig Dispense Refill  . amitriptyline (ELAVIL) 75 MG tablet TAKE 1 TABLET(75 MG) BY MOUTH AT BEDTIME 30 tablet 2  . B Complex Vitamins  (B-COMPLEX/B-12 PO) Take 1 tablet by mouth daily.     . baclofen (LIORESAL) 10 MG tablet Take 2 tablets (20 mg total) by mouth 3 (three) times daily. 540 tablet 3  . celecoxib (CELEBREX) 100 MG capsule TAKE 1 CAPSULE(100 MG) BY MOUTH AT BEDTIME 30 capsule 2  . clotrimazole-betamethasone (LOTRISONE) cream Apply 1 application topically 2 (two) times daily. 90 g 2  . Cranberry 400 MG TABS Take 400 mg by mouth daily.     . Cyanocobalamin (VITAMIN B-12 CR PO) Take 1 tablet by mouth daily.     . DULoxetine (CYMBALTA) 60 MG capsule TAKE 1 CAPSULE DAILY (Patient taking differently: Take 60 mg by mouth daily. ) 90 capsule 3  . finasteride (PROSCAR) 5 MG tablet Take 5 mg by mouth daily.     Marland Kitchen glucagon 1 MG injection Inject 1 mg into the vein once as needed. 1 each 12  . glucose blood (BAYER CONTOUR NEXT TEST) test strip MEDICALLY NECESSARY FOR USE WITH PUMP; Use to check blood sugar 5 times per day and prn; E11.42 500 each 3  . HYDROcodone-acetaminophen (NORCO/VICODIN) 5-325 MG tablet Take 1-2 tablets by mouth every 6 (six) hours as needed. 12 tablet 0  . Immune Globulin 10% (IMMUNE GLOBULIN 10%) 10G/121mL (10,000mg /140mL) SOLN Inject 105 g into the vein every 6 (six) weeks. 289.5 mL 10  . Insulin Infusion Pump Supplies (PARADIGM RESERVOIR 3ML) MISC 1 Device by Does not apply route every 3 (three) days. 30 each 3  . Insulin Infusion Pump Supplies (QUICK-SET INFUSION 43" 9MM) MISC 1 Device by Does not apply route every 3 (three) days. 30 each 3  . insulin lispro (HUMALOG) 100 UNIT/ML injection INJECT 120 UNITS DAILY IN PUMP 120 mL 3  . lidocaine (LIDODERM) 5 % At 7 am and remove at 7 pm. Apply 1 on each side. (Patient taking differently: Place 2 patches onto the skin daily as needed (pain). ) 180 patch 2  . lisinopril-hydrochlorothiazide (ZESTORETIC) 10-12.5 MG tablet TAKE 1 TABLET DAILY. PLEASE SEE PRIMARY CARE PHYSICIAN FOR ANY FUTURE REFILLS 90 tablet 3  . methocarbamol (ROBAXIN-750) 750 MG tablet Take 1  tablet (750 mg total) by mouth 3 (three) times daily as needed for muscle spasms. 90 tablet 1  . Multiple Vitamin (MULTIVITAMIN) capsule Take 1 capsule by mouth daily.     . predniSONE (STERAPRED UNI-PAK 21 TAB) 10 MG (21) TBPK tablet Take 4 tablets (40 mg total) by mouth daily for 2 days, THEN 3 tablets (30 mg total) daily for 2 days, THEN 2 tablets (20 mg total) daily for 2 days, THEN 1 tablet (10 mg total) daily for 2 days. 20 tablet 0  . scopolamine (TRANSDERM-SCOP) 1 MG/3DAYS Place 1 patch (1.5 mg total) onto the skin every 3 (three) days. (Patient taking differently: Place 1 patch onto the skin daily as  needed (dizziness). ) 10 patch 12   No current facility-administered medications on file prior to visit.    No Known Allergies  Family History  Problem Relation Age of Onset  . Dementia Mother   . Diabetes Mellitus I Mother   . Hypertension Father        30  . Healthy Sister   . Rheum arthritis Brother   . Cancer Neg Hx   . Colon cancer Neg Hx   . Esophageal cancer Neg Hx   . Stomach cancer Neg Hx   . Rectal cancer Neg Hx   . Colon polyps Neg Hx     BP (!) 154/82   Pulse 86   Ht 6' (1.829 m)   Wt 234 lb 3.2 oz (106.2 kg)   SpO2 90%   BMI 31.76 kg/m    Review of Systems Denies LOC    Objective:   Physical Exam VITAL SIGNS:  See vs page GENERAL: no distress Pulses: dorsalis pedis intact bilat.   MSK: no deformity of the feet CV: trace bilat leg edema Skin:  no ulcer on the feet.  normal color and temp on the feet.   Neuro: sensation is intact to touch on the feet.  Ext: there is bilateral onychomycosis of the toenails.     A1c=8.1%    Assessment & Plan:  Type 1 DM, with DR: uncontrolled.  Knee pain: side effect of steroids is increased glucose for a few days.   Device malfunction: Pt is ref to CDE, to address pump issues.   Hypoglycemia, due to insulin: this limits aggressiveness of glycemic control Your blood pressure is high today.  Please see your  primary care provider soon, to have it rechecked  Patient Instructions  Your blood pressure is high today.  Please see your primary care provider soon, to have it rechecked Please take these pump settings:  basal rate of 4.5 units/hr 6 AM-10 PM, and 2.6 units/hr 10 PM-6 AM (when not in auto mode). Mealtime bolus of 1 unit/7 grams cardohydrate correction bolus (which some people call "sensitivity," or "insulin sensitivity ratio," or just "isr") of 1 unit for each 50 by which your glucose exceeds 100.  suspend the pump for 1-2 hrs, with activity.  If not, eat a light snack with it.   On this type of insulin schedule, you should eat meals on a regular schedule.  If a meal is missed or significantly delayed, your blood sugar could go low.  Please come back for a follow-up appointment in 2 months.

## 2020-01-25 DIAGNOSIS — M21371 Foot drop, right foot: Secondary | ICD-10-CM | POA: Diagnosis not present

## 2020-01-30 DIAGNOSIS — M21371 Foot drop, right foot: Secondary | ICD-10-CM | POA: Diagnosis not present

## 2020-01-30 DIAGNOSIS — M545 Low back pain, unspecified: Secondary | ICD-10-CM | POA: Diagnosis not present

## 2020-01-31 ENCOUNTER — Encounter (HOSPITAL_COMMUNITY): Payer: Self-pay | Admitting: Neurosurgery

## 2020-01-31 ENCOUNTER — Other Ambulatory Visit: Payer: Self-pay | Admitting: Neurosurgery

## 2020-01-31 DIAGNOSIS — M5126 Other intervertebral disc displacement, lumbar region: Secondary | ICD-10-CM | POA: Diagnosis not present

## 2020-01-31 DIAGNOSIS — M21371 Foot drop, right foot: Secondary | ICD-10-CM | POA: Diagnosis not present

## 2020-01-31 NOTE — Progress Notes (Signed)
PCP - Dr Riki Sheer Cardiologist - n/a Internal Med - Dr Renato Shin (DM-OV 01/24/20) Neurology - Narda Amber, DO  Chest x-ray - n/a EKG - 05/18/19 NSR Stress Test - 10 yrs ago - no follow up needed ECHO - 10 yrs ago -no follow up needed Cardiac Cath - n/a  Sleep Study -  Yes CPAP - wears cpap nightly  Anesthesia -Yes Insulin Pump.  Per Dr Linna Caprice, no solid foods past midnight tonight, clears liquids until 8 am DOS.  Continue insulin pump on 2.6 units/hr.  We will check his cbg upon arrival on treat if needed.  If patient feels like sugar is low between 8 am - 10 am, he may have small amt of sprite-coke-tea etc only if necessary.  Fasting Blood Sugar - 100-230s (takes prednisone) Checks Blood Sugar 3-4  times a day  . If your blood sugar is less than 70 mg/dL, you will need to treat for low blood sugar: o Treat a low blood sugar (less than 70 mg/dL) with  cup of clear juice (cranberry or apple), 4 glucose tablets, OR glucose gel. o Recheck blood sugar in 15 minutes after treatment (to make sure it is greater than 70 mg/dL). If your blood sugar is not greater than 70 mg/dL on recheck, call [phone] for further instructions.  STOP now taking any Aspirin (unless otherwise instructed by your surgeon), Aleve, Naproxen, Ibuprofen, Motrin, Advil, Goody's, BC's, all herbal medications, fish oil, and all vitamins.  Anesthesia -Yes Insulin Pump.  Per Dr Linna Caprice, no solid foods past midnight tonight, clears liquids until 8 am DOS.  Continue insulin pump on 2.6 units/hr.  We will check his cbg upon arrival on treat if needed.  If patient feels like sugar is low between 8 am - 10 am, he may have small amt of sprite-coke-tea etc only if necessary.  Coronavirus Screening Covid test on DOS 02/01/20. Do you have any of the following symptoms:  Cough yes/no: No Fever (>100.41F)  yes/no: No Runny nose yes/no: No Sore throat yes/no: No Difficulty breathing/shortness of breath  yes/no:  No  Have you traveled in the last 14 days and where? yes/no: No  Patient verbalized understanding of instructions that were given via phone.

## 2020-02-01 ENCOUNTER — Encounter (HOSPITAL_COMMUNITY)
Admission: RE | Disposition: A | Discharge status: Home / Self Care | Payer: Self-pay | Source: Home / Self Care | Attending: Neurosurgery

## 2020-02-01 ENCOUNTER — Encounter (HOSPITAL_COMMUNITY): Payer: Self-pay | Admitting: Neurosurgery

## 2020-02-01 ENCOUNTER — Ambulatory Visit (HOSPITAL_COMMUNITY): Payer: BC Managed Care – PPO | Admitting: Certified Registered Nurse Anesthetist

## 2020-02-01 ENCOUNTER — Inpatient Hospital Stay (HOSPITAL_COMMUNITY): Payer: BC Managed Care – PPO

## 2020-02-01 ENCOUNTER — Other Ambulatory Visit: Payer: Self-pay

## 2020-02-01 ENCOUNTER — Observation Stay (HOSPITAL_COMMUNITY)
Admission: RE | Admit: 2020-02-01 | Discharge: 2020-02-02 | Disposition: A | Discharge status: Home / Self Care | Payer: BC Managed Care – PPO | Attending: Neurosurgery | Admitting: Neurosurgery

## 2020-02-01 DIAGNOSIS — Z981 Arthrodesis status: Secondary | ICD-10-CM | POA: Diagnosis not present

## 2020-02-01 DIAGNOSIS — Z20822 Contact with and (suspected) exposure to covid-19: Secondary | ICD-10-CM | POA: Insufficient documentation

## 2020-02-01 DIAGNOSIS — E109 Type 1 diabetes mellitus without complications: Secondary | ICD-10-CM | POA: Diagnosis not present

## 2020-02-01 DIAGNOSIS — Z794 Long term (current) use of insulin: Secondary | ICD-10-CM | POA: Insufficient documentation

## 2020-02-01 DIAGNOSIS — M5126 Other intervertebral disc displacement, lumbar region: Principal | ICD-10-CM | POA: Insufficient documentation

## 2020-02-01 DIAGNOSIS — E785 Hyperlipidemia, unspecified: Secondary | ICD-10-CM | POA: Diagnosis not present

## 2020-02-01 DIAGNOSIS — G4733 Obstructive sleep apnea (adult) (pediatric): Secondary | ICD-10-CM | POA: Diagnosis not present

## 2020-02-01 DIAGNOSIS — Z79899 Other long term (current) drug therapy: Secondary | ICD-10-CM | POA: Insufficient documentation

## 2020-02-01 DIAGNOSIS — I10 Essential (primary) hypertension: Secondary | ICD-10-CM | POA: Diagnosis not present

## 2020-02-01 DIAGNOSIS — M5116 Intervertebral disc disorders with radiculopathy, lumbar region: Secondary | ICD-10-CM | POA: Diagnosis not present

## 2020-02-01 DIAGNOSIS — Z4889 Encounter for other specified surgical aftercare: Secondary | ICD-10-CM | POA: Diagnosis not present

## 2020-02-01 DIAGNOSIS — Z419 Encounter for procedure for purposes other than remedying health state, unspecified: Secondary | ICD-10-CM

## 2020-02-01 HISTORY — PX: LUMBAR LAMINECTOMY/DECOMPRESSION MICRODISCECTOMY: SHX5026

## 2020-02-01 HISTORY — DX: Unspecified hearing loss, unspecified ear: H91.90

## 2020-02-01 LAB — GLUCOSE, CAPILLARY
Glucose-Capillary: 120 mg/dL — ABNORMAL HIGH (ref 70–99)
Glucose-Capillary: 62 mg/dL — ABNORMAL LOW (ref 70–99)
Glucose-Capillary: 74 mg/dL (ref 70–99)
Glucose-Capillary: 89 mg/dL (ref 70–99)
Glucose-Capillary: 69 mg/dL — ABNORMAL LOW (ref 70–99)
Glucose-Capillary: 97 mg/dL (ref 70–99)
Glucose-Capillary: 84 mg/dL (ref 70–99)
Glucose-Capillary: 104 mg/dL — ABNORMAL HIGH (ref 70–99)
Glucose-Capillary: 255 mg/dL — ABNORMAL HIGH (ref 70–99)
Glucose-Capillary: 248 mg/dL — ABNORMAL HIGH (ref 70–99)

## 2020-02-01 LAB — CBC
HCT: 51.2 % (ref 39.0–52.0)
Hemoglobin: 17.1 g/dL — ABNORMAL HIGH (ref 13.0–17.0)
MCH: 29.5 pg (ref 26.0–34.0)
MCHC: 33.4 g/dL (ref 30.0–36.0)
MCV: 88.3 fL (ref 80.0–100.0)
Platelets: 225 10*3/uL (ref 150–400)
RBC: 5.8 MIL/uL (ref 4.22–5.81)
RDW: 13.7 % (ref 11.5–15.5)
WBC: 9.1 10*3/uL (ref 4.0–10.5)
nRBC: 0 % (ref 0.0–0.2)

## 2020-02-01 LAB — BASIC METABOLIC PANEL
Anion gap: 12 (ref 5–15)
BUN: 20 mg/dL (ref 8–23)
CO2: 27 mmol/L (ref 22–32)
Calcium: 9 mg/dL (ref 8.9–10.3)
Chloride: 97 mmol/L — ABNORMAL LOW (ref 98–111)
Creatinine, Ser: 0.98 mg/dL (ref 0.61–1.24)
GFR, Estimated: >60 mL/min (ref >60)
Glucose, Bld: 68 mg/dL — ABNORMAL LOW (ref 70–99)
Potassium: 3.9 mmol/L (ref 3.5–5.1)
Sodium: 136 mmol/L (ref 135–145)

## 2020-02-01 LAB — SURGICAL PCR SCREEN
MRSA, PCR: NEGATIVE
Staphylococcus aureus: NEGATIVE

## 2020-02-01 LAB — SARS CORONAVIRUS 2 BY RT PCR (HOSPITAL ORDER, PERFORMED IN ~~LOC~~ HOSPITAL LAB): SARS Coronavirus 2: NEGATIVE

## 2020-02-01 SURGERY — LUMBAR LAMINECTOMY/DECOMPRESSION MICRODISCECTOMY 1 LEVEL
Anesthesia: General | Site: Spine Lumbar | Laterality: Right

## 2020-02-01 MED ORDER — FENTANYL CITRATE (PF) 100 MCG/2ML IJ SOLN
INTRAMUSCULAR | Status: AC
  Filled 2020-02-01: qty 2

## 2020-02-01 MED ORDER — ONDANSETRON HCL 4 MG PO TABS: 4.0000 mg | ORAL_TABLET | Freq: Four times a day (QID) | ORAL | Status: DC | PRN

## 2020-02-01 MED ORDER — LIDOCAINE 2% (20 MG/ML) 5 ML SYRINGE
INTRAMUSCULAR | Status: DC | PRN
  Administered 2020-02-01: 60 mg via INTRAVENOUS

## 2020-02-01 MED ORDER — PROPOFOL 10 MG/ML IV BOLUS
INTRAVENOUS | Status: AC
  Filled 2020-02-01: qty 20

## 2020-02-01 MED ORDER — MIDAZOLAM HCL 2 MG/2ML IJ SOLN
INTRAMUSCULAR | Status: AC
  Filled 2020-02-01: qty 2

## 2020-02-01 MED ORDER — SODIUM CHLORIDE 0.9 % IV SOLN: 250.0000 mL | INTRAVENOUS | Status: DC

## 2020-02-01 MED ORDER — AMISULPRIDE (ANTIEMETIC) 5 MG/2ML IV SOLN: 10.0000 mg | Freq: Once | INTRAVENOUS | Status: DC | PRN

## 2020-02-01 MED ORDER — DOCUSATE SODIUM 100 MG PO CAPS
100.0000 mg | ORAL_CAPSULE | Freq: Two times a day (BID) | ORAL | Status: DC
  Administered 2020-02-01 – 2020-02-02 (×2): 100 mg via ORAL
  Filled 2020-02-01 (×2): qty 1

## 2020-02-01 MED ORDER — LIDOCAINE 2% (20 MG/ML) 5 ML SYRINGE
INTRAMUSCULAR | Status: AC
  Filled 2020-02-01: qty 5

## 2020-02-01 MED ORDER — ACETAMINOPHEN 325 MG PO TABS
650.0000 mg | ORAL_TABLET | ORAL | Status: DC | PRN
Start: 2020-02-01 — End: 2020-02-02

## 2020-02-01 MED ORDER — PHENYLEPHRINE HCL-NACL 10-0.9 MG/250ML-% IV SOLN
INTRAVENOUS | Status: DC | PRN
  Administered 2020-02-01: 25 ug/min via INTRAVENOUS

## 2020-02-01 MED ORDER — ROCURONIUM BROMIDE 10 MG/ML (PF) SYRINGE
PREFILLED_SYRINGE | INTRAVENOUS | Status: DC | PRN
  Administered 2020-02-01: 20 mg via INTRAVENOUS
  Administered 2020-02-01: 40 mg via INTRAVENOUS
  Administered 2020-02-01 (×2): 20 mg via INTRAVENOUS

## 2020-02-01 MED ORDER — LACTATED RINGERS IV SOLN: INTRAVENOUS | Status: DC

## 2020-02-01 MED ORDER — OXYCODONE HCL 5 MG PO TABS
5.0000 mg | ORAL_TABLET | ORAL | Status: DC | PRN
  Administered 2020-02-01: 10 mg via ORAL
  Filled 2020-02-01: qty 2

## 2020-02-01 MED ORDER — CHLORHEXIDINE GLUCONATE 0.12 % MT SOLN
15.0000 mL | Freq: Once | OROMUCOSAL | Status: AC
  Administered 2020-02-01: 15 mL via OROMUCOSAL
  Filled 2020-02-01: qty 15

## 2020-02-01 MED ORDER — INSULIN PUMP
Freq: Three times a day (TID) | SUBCUTANEOUS | Status: DC
  Filled 2020-02-01: qty 1

## 2020-02-01 MED ORDER — DEXAMETHASONE SODIUM PHOSPHATE 10 MG/ML IJ SOLN
INTRAMUSCULAR | Status: AC
  Filled 2020-02-01: qty 1

## 2020-02-01 MED ORDER — LIDOCAINE-EPINEPHRINE 1 %-1:100000 IJ SOLN
INTRAMUSCULAR | Status: DC | PRN
  Administered 2020-02-01: 5 mL

## 2020-02-01 MED ORDER — SODIUM CHLORIDE 0.9% FLUSH: 3.0000 mL | Freq: Two times a day (BID) | INTRAVENOUS | Status: DC

## 2020-02-01 MED ORDER — BACLOFEN 20 MG PO TABS
20.0000 mg | ORAL_TABLET | Freq: Three times a day (TID) | ORAL | Status: DC
  Administered 2020-02-01 – 2020-02-02 (×2): 20 mg via ORAL
  Filled 2020-02-01: qty 2
  Filled 2020-02-01 (×4): qty 1

## 2020-02-01 MED ORDER — SENNOSIDES-DOCUSATE SODIUM 8.6-50 MG PO TABS: 1.0000 | ORAL_TABLET | Freq: Every evening | ORAL | Status: DC | PRN

## 2020-02-01 MED ORDER — DEXTROSE 50 % IV SOLN
INTRAVENOUS | Status: AC
  Administered 2020-02-01: 25 g
  Filled 2020-02-01: qty 50

## 2020-02-01 MED ORDER — ACETAMINOPHEN 650 MG RE SUPP: 650.0000 mg | RECTAL | Status: DC | PRN

## 2020-02-01 MED ORDER — PROPOFOL 10 MG/ML IV BOLUS
INTRAVENOUS | Status: DC | PRN
  Administered 2020-02-01: 150 mg via INTRAVENOUS
  Administered 2020-02-01: 50 mg via INTRAVENOUS

## 2020-02-01 MED ORDER — LISINOPRIL-HYDROCHLOROTHIAZIDE 10-12.5 MG PO TABS: 1.0000 | ORAL_TABLET | Freq: Every day | ORAL | Status: DC

## 2020-02-01 MED ORDER — PHENYLEPHRINE 40 MCG/ML (10ML) SYRINGE FOR IV PUSH (FOR BLOOD PRESSURE SUPPORT)
PREFILLED_SYRINGE | INTRAVENOUS | Status: AC
  Filled 2020-02-01: qty 10

## 2020-02-01 MED ORDER — THROMBIN 5000 UNITS EX SOLR
OROMUCOSAL | Status: DC | PRN
  Administered 2020-02-01: 5 mL via TOPICAL

## 2020-02-01 MED ORDER — DIPHENHYDRAMINE HCL 25 MG PO CAPS: 25.0000 mg | ORAL_CAPSULE | Freq: Four times a day (QID) | ORAL | Status: DC | PRN

## 2020-02-01 MED ORDER — ZOLPIDEM TARTRATE 5 MG PO TABS
5.0000 mg | ORAL_TABLET | Freq: Every evening | ORAL | Status: DC | PRN
  Administered 2020-02-01: 5 mg via ORAL
  Filled 2020-02-01: qty 1

## 2020-02-01 MED ORDER — 0.9 % SODIUM CHLORIDE (POUR BTL) OPTIME
TOPICAL | Status: DC | PRN
  Administered 2020-02-01: 1000 mL

## 2020-02-01 MED ORDER — SODIUM CHLORIDE 0.9 % IV SOLN: INTRAVENOUS | Status: DC

## 2020-02-01 MED ORDER — METHOCARBAMOL 1000 MG/10ML IJ SOLN
500.0000 mg | Freq: Four times a day (QID) | INTRAVENOUS | Status: DC | PRN
  Filled 2020-02-01: qty 5

## 2020-02-01 MED ORDER — CHLORHEXIDINE GLUCONATE CLOTH 2 % EX PADS: 6.0000 | MEDICATED_PAD | Freq: Once | CUTANEOUS | Status: DC

## 2020-02-01 MED ORDER — LIDOCAINE-EPINEPHRINE 1 %-1:100000 IJ SOLN
INTRAMUSCULAR | Status: AC
  Filled 2020-02-01: qty 1

## 2020-02-01 MED ORDER — FENTANYL CITRATE (PF) 100 MCG/2ML IJ SOLN
25.0000 ug | INTRAMUSCULAR | Status: DC | PRN
  Administered 2020-02-01: 50 ug via INTRAVENOUS
  Administered 2020-02-01: 25 ug via INTRAVENOUS
  Administered 2020-02-01: 50 ug via INTRAVENOUS
  Administered 2020-02-01: 25 ug via INTRAVENOUS

## 2020-02-01 MED ORDER — ACETAMINOPHEN 500 MG PO TABS: 1000.0000 mg | ORAL_TABLET | Freq: Once | ORAL | Status: DC

## 2020-02-01 MED ORDER — BUPIVACAINE HCL (PF) 0.5 % IJ SOLN
INTRAMUSCULAR | Status: DC | PRN
  Administered 2020-02-01: 5 mL

## 2020-02-01 MED ORDER — PHENOL 1.4 % MT LIQD: 1.0000 | OROMUCOSAL | Status: DC | PRN

## 2020-02-01 MED ORDER — GABAPENTIN 300 MG PO CAPS
300.0000 mg | ORAL_CAPSULE | Freq: Three times a day (TID) | ORAL | Status: DC
  Administered 2020-02-01 – 2020-02-02 (×2): 300 mg via ORAL
  Filled 2020-02-01 (×2): qty 1

## 2020-02-01 MED ORDER — FENTANYL CITRATE (PF) 100 MCG/2ML IJ SOLN
INTRAMUSCULAR | Status: DC | PRN
  Administered 2020-02-01 (×3): 50 ug via INTRAVENOUS

## 2020-02-01 MED ORDER — ONDANSETRON HCL 4 MG/2ML IJ SOLN
INTRAMUSCULAR | Status: AC
  Filled 2020-02-01: qty 2

## 2020-02-01 MED ORDER — DULOXETINE HCL 60 MG PO CPEP
60.0000 mg | ORAL_CAPSULE | Freq: Every day | ORAL | Status: DC
  Administered 2020-02-01: 60 mg via ORAL
  Filled 2020-02-01: qty 1

## 2020-02-01 MED ORDER — METHOCARBAMOL 750 MG PO TABS: 750.0000 mg | ORAL_TABLET | Freq: Three times a day (TID) | ORAL | Status: DC | PRN

## 2020-02-01 MED ORDER — METHYLPREDNISOLONE ACETATE 40 MG/ML IJ SUSP
INTRAMUSCULAR | Status: AC
  Filled 2020-02-01: qty 1

## 2020-02-01 MED ORDER — LISINOPRIL 10 MG PO TABS
10.0000 mg | ORAL_TABLET | Freq: Every day | ORAL | Status: DC
  Administered 2020-02-01: 10 mg via ORAL
  Filled 2020-02-01: qty 1

## 2020-02-01 MED ORDER — ONDANSETRON HCL 4 MG/2ML IJ SOLN
INTRAMUSCULAR | Status: DC | PRN
  Administered 2020-02-01: 4 mg via INTRAVENOUS

## 2020-02-01 MED ORDER — METHYLPREDNISOLONE ACETATE 40 MG/ML IJ SUSP
INTRAMUSCULAR | Status: DC | PRN
  Administered 2020-02-01: 40 mg via INTRAMUSCULAR

## 2020-02-01 MED ORDER — BUPIVACAINE HCL (PF) 0.5 % IJ SOLN
INTRAMUSCULAR | Status: AC
  Filled 2020-02-01: qty 30

## 2020-02-01 MED ORDER — THROMBIN 5000 UNITS EX SOLR
CUTANEOUS | Status: AC
  Filled 2020-02-01: qty 5000

## 2020-02-01 MED ORDER — BISACODYL 10 MG RE SUPP
10.0000 mg | Freq: Every day | RECTAL | Status: DC | PRN
Start: 2020-02-01 — End: 2020-02-02

## 2020-02-01 MED ORDER — PHENYLEPHRINE 40 MCG/ML (10ML) SYRINGE FOR IV PUSH (FOR BLOOD PRESSURE SUPPORT)
PREFILLED_SYRINGE | INTRAVENOUS | Status: DC | PRN
  Administered 2020-02-01 (×5): 80 ug via INTRAVENOUS

## 2020-02-01 MED ORDER — CEFAZOLIN SODIUM-DEXTROSE 2-4 GM/100ML-% IV SOLN
2.0000 g | INTRAVENOUS | Status: AC
  Administered 2020-02-01: 2 g via INTRAVENOUS
  Filled 2020-02-01: qty 100

## 2020-02-01 MED ORDER — SODIUM CHLORIDE 0.9% FLUSH: 3.0000 mL | INTRAVENOUS | Status: DC | PRN

## 2020-02-01 MED ORDER — FENTANYL CITRATE (PF) 250 MCG/5ML IJ SOLN
INTRAMUSCULAR | Status: AC
  Filled 2020-02-01: qty 5

## 2020-02-01 MED ORDER — AMITRIPTYLINE HCL 25 MG PO TABS
75.0000 mg | ORAL_TABLET | Freq: Every day | ORAL | Status: DC
  Administered 2020-02-01: 75 mg via ORAL
  Filled 2020-02-01: qty 1
  Filled 2020-02-01: qty 3

## 2020-02-01 MED ORDER — CELECOXIB 100 MG PO CAPS
100.0000 mg | ORAL_CAPSULE | Freq: Every day | ORAL | Status: DC
  Administered 2020-02-01: 100 mg via ORAL
  Filled 2020-02-01 (×2): qty 1

## 2020-02-01 MED ORDER — CEFAZOLIN SODIUM-DEXTROSE 2-4 GM/100ML-% IV SOLN
2.0000 g | Freq: Three times a day (TID) | INTRAVENOUS | Status: AC
  Administered 2020-02-01 – 2020-02-02 (×2): 2 g via INTRAVENOUS
  Filled 2020-02-01 (×2): qty 100

## 2020-02-01 MED ORDER — ACETAMINOPHEN 500 MG PO TABS
1000.0000 mg | ORAL_TABLET | Freq: Four times a day (QID) | ORAL | Status: DC
  Administered 2020-02-01 – 2020-02-02 (×3): 1000 mg via ORAL
  Filled 2020-02-01 (×3): qty 2

## 2020-02-01 MED ORDER — BACITRACIN ZINC 500 UNIT/GM EX OINT
TOPICAL_OINTMENT | Freq: Every day | CUTANEOUS | Status: DC | PRN
  Filled 2020-02-01: qty 28.4

## 2020-02-01 MED ORDER — MIDAZOLAM HCL 5 MG/5ML IJ SOLN
INTRAMUSCULAR | Status: DC | PRN
  Administered 2020-02-01: 2 mg via INTRAVENOUS

## 2020-02-01 MED ORDER — FLEET ENEMA 7-19 GM/118ML RE ENEM: 1.0000 | ENEMA | Freq: Once | RECTAL | Status: DC | PRN

## 2020-02-01 MED ORDER — ONDANSETRON HCL 4 MG/2ML IJ SOLN: 4.0000 mg | Freq: Four times a day (QID) | INTRAMUSCULAR | Status: DC | PRN

## 2020-02-01 MED ORDER — DEXAMETHASONE SODIUM PHOSPHATE 10 MG/ML IJ SOLN
INTRAMUSCULAR | Status: DC | PRN
  Administered 2020-02-01: 4 mg via INTRAVENOUS

## 2020-02-01 MED ORDER — EPHEDRINE 5 MG/ML INJ
INTRAVENOUS | Status: AC
  Filled 2020-02-01: qty 10

## 2020-02-01 MED ORDER — HYDROCODONE-ACETAMINOPHEN 5-325 MG PO TABS
1.0000 | ORAL_TABLET | ORAL | Status: DC | PRN
  Administered 2020-02-01 – 2020-02-02 (×2): 1 via ORAL
  Filled 2020-02-01 (×2): qty 1

## 2020-02-01 MED ORDER — HYDROCHLOROTHIAZIDE 12.5 MG PO CAPS
12.5000 mg | ORAL_CAPSULE | Freq: Every day | ORAL | Status: DC
  Administered 2020-02-01: 12.5 mg via ORAL
  Filled 2020-02-01: qty 1

## 2020-02-01 MED ORDER — METHOCARBAMOL 500 MG PO TABS
500.0000 mg | ORAL_TABLET | Freq: Four times a day (QID) | ORAL | Status: DC | PRN
  Administered 2020-02-01: 500 mg via ORAL
  Filled 2020-02-01 (×2): qty 1

## 2020-02-01 MED ORDER — ROCURONIUM BROMIDE 10 MG/ML (PF) SYRINGE
PREFILLED_SYRINGE | INTRAVENOUS | Status: AC
  Filled 2020-02-01: qty 10

## 2020-02-01 MED ORDER — ORAL CARE MOUTH RINSE: 15.0000 mL | Freq: Once | OROMUCOSAL | Status: AC

## 2020-02-01 MED ORDER — METHOCARBAMOL 500 MG PO TABS
ORAL_TABLET | ORAL | Status: AC
  Filled 2020-02-01: qty 1

## 2020-02-01 MED ORDER — MENTHOL 3 MG MT LOZG: 1.0000 | LOZENGE | OROMUCOSAL | Status: DC | PRN

## 2020-02-01 MED ORDER — FINASTERIDE 5 MG PO TABS
5.0000 mg | ORAL_TABLET | Freq: Every day | ORAL | Status: DC
  Administered 2020-02-01: 5 mg via ORAL
  Filled 2020-02-01: qty 1

## 2020-02-01 MED ORDER — SENNA 8.6 MG PO TABS
1.0000 | ORAL_TABLET | Freq: Two times a day (BID) | ORAL | Status: DC
  Administered 2020-02-01 – 2020-02-02 (×2): 8.6 mg via ORAL
  Filled 2020-02-01 (×2): qty 1

## 2020-02-01 MED ORDER — HYDROMORPHONE HCL 1 MG/ML IJ SOLN: 0.5000 mg | INTRAMUSCULAR | Status: DC | PRN

## 2020-02-01 SURGICAL SUPPLY — 62 items
ADH SKN CLS APL DERMABOND .7 (GAUZE/BANDAGES/DRESSINGS) ×1
APL SKNCLS STERI-STRIP NONHPOA (GAUZE/BANDAGES/DRESSINGS)
BAND INSRT 18 STRL LF DISP RB (MISCELLANEOUS) ×2
BAND RUBBER #18 3X1/16 STRL (MISCELLANEOUS) ×4 IMPLANT
BENZOIN TINCTURE PRP APPL 2/3 (GAUZE/BANDAGES/DRESSINGS) IMPLANT
BLADE CLIPPER SURG (BLADE) ×2 IMPLANT
BLADE SURG 11 STRL SS (BLADE) ×2 IMPLANT
BUR MATCHSTICK NEURO 3.0 LAGG (BURR) ×2 IMPLANT
BUR PRECISION FLUTE 5.0 (BURR) ×2 IMPLANT
CANISTER SUCT 3000ML PPV (MISCELLANEOUS) ×2 IMPLANT
CARTRIDGE OIL MAESTRO DRILL (MISCELLANEOUS) ×1 IMPLANT
COVER WAND RF STERILE (DRAPES) IMPLANT
DECANTER SPIKE VIAL GLASS SM (MISCELLANEOUS) ×2 IMPLANT
DERMABOND ADVANCED (GAUZE/BANDAGES/DRESSINGS) ×1
DERMABOND ADVANCED .7 DNX12 (GAUZE/BANDAGES/DRESSINGS) ×1 IMPLANT
DIFFUSER DRILL AIR PNEUMATIC (MISCELLANEOUS) ×2 IMPLANT
DRAPE LAPAROTOMY 100X72X124 (DRAPES) ×2 IMPLANT
DRAPE MICROSCOPE LEICA (MISCELLANEOUS) ×2 IMPLANT
DRAPE SURG 17X23 STRL (DRAPES) ×2 IMPLANT
DRSG OPSITE POSTOP 3X4 (GAUZE/BANDAGES/DRESSINGS) ×2 IMPLANT
DURAPREP 26ML APPLICATOR (WOUND CARE) ×2 IMPLANT
ELECT REM PT RETURN 9FT ADLT (ELECTROSURGICAL) ×2
ELECTRODE REM PT RTRN 9FT ADLT (ELECTROSURGICAL) ×1 IMPLANT
GAUZE 4X4 16PLY RFD (DISPOSABLE) IMPLANT
GAUZE SPONGE 4X4 12PLY STRL (GAUZE/BANDAGES/DRESSINGS) IMPLANT
GLOVE BIO SURGEON STRL SZ 6.5 (GLOVE) ×8 IMPLANT
GLOVE BIO SURGEON STRL SZ7.5 (GLOVE) ×4 IMPLANT
GLOVE BIOGEL PI IND STRL 7.5 (GLOVE) ×2 IMPLANT
GLOVE BIOGEL PI IND STRL 8 (GLOVE) ×1 IMPLANT
GLOVE BIOGEL PI INDICATOR 7.5 (GLOVE) ×2
GLOVE BIOGEL PI INDICATOR 8 (GLOVE) ×1
GLOVE ECLIPSE 7.0 STRL STRAW (GLOVE) ×2 IMPLANT
GLOVE EXAM NITRILE XL STR (GLOVE) IMPLANT
GLOVE SURG UNDER POLY LF SZ6.5 (GLOVE) ×2 IMPLANT
GOWN STRL REUS W/ TWL LRG LVL3 (GOWN DISPOSABLE) ×5 IMPLANT
GOWN STRL REUS W/ TWL XL LVL3 (GOWN DISPOSABLE) IMPLANT
GOWN STRL REUS W/TWL 2XL LVL3 (GOWN DISPOSABLE) IMPLANT
GOWN STRL REUS W/TWL LRG LVL3 (GOWN DISPOSABLE) ×10
GOWN STRL REUS W/TWL XL LVL3 (GOWN DISPOSABLE)
HEMOSTAT POWDER KIT SURGIFOAM (HEMOSTASIS) ×2 IMPLANT
HEMOSTAT POWDER SURGIFOAM 1G (HEMOSTASIS) ×2 IMPLANT
KIT BASIN OR (CUSTOM PROCEDURE TRAY) ×2 IMPLANT
KIT TURNOVER KIT B (KITS) ×2 IMPLANT
NEEDLE 18GX1X1/2 (RX/OR ONLY) (NEEDLE) ×2 IMPLANT
NEEDLE HYPO 18GX1.5 BLUNT FILL (NEEDLE) IMPLANT
NEEDLE HYPO 22GX1.5 SAFETY (NEEDLE) ×2 IMPLANT
NEEDLE SPNL 18GX3.5 QUINCKE PK (NEEDLE) ×2 IMPLANT
NS IRRIG 1000ML POUR BTL (IV SOLUTION) ×2 IMPLANT
OIL CARTRIDGE MAESTRO DRILL (MISCELLANEOUS) ×2
PACK LAMINECTOMY NEURO (CUSTOM PROCEDURE TRAY) ×2 IMPLANT
PAD ARMBOARD 7.5X6 YLW CONV (MISCELLANEOUS) ×10 IMPLANT
SPONGE LAP 4X18 RFD (DISPOSABLE) IMPLANT
SPONGE SURGIFOAM ABS GEL SZ50 (HEMOSTASIS) IMPLANT
STRIP CLOSURE SKIN 1/2X4 (GAUZE/BANDAGES/DRESSINGS) IMPLANT
SUT VIC AB 0 CT1 18XCR BRD8 (SUTURE) ×2 IMPLANT
SUT VIC AB 0 CT1 8-18 (SUTURE) ×4
SUT VIC AB 2-0 CT1 18 (SUTURE) IMPLANT
SUT VICRYL 3-0 RB1 18 ABS (SUTURE) ×2 IMPLANT
SYR 3ML LL SCALE MARK (SYRINGE) ×2 IMPLANT
TOWEL GREEN STERILE (TOWEL DISPOSABLE) ×2 IMPLANT
TOWEL GREEN STERILE FF (TOWEL DISPOSABLE) ×2 IMPLANT
WATER STERILE IRR 1000ML POUR (IV SOLUTION) ×2 IMPLANT

## 2020-02-01 NOTE — H&P (Signed)
Chief Complaint   No chief complaint on file.   HPI   HPI: Kenneth Pace is a 69 y.o. male who previously underwent right L4-5 laminotomy and microdiscectomy approximately 9 months ago for a large disc herniation who was doing well up until several weeks ago when he developed severe recurrent lower back and right leg pain associated with right foot weakness. Am MRI L spine was ordered revealing a large recurrent disc herniation at L4-5. Given severity of symptoms alongside right dorsiflexion weakness, it was recommended he undergo repeat microdiscectomy at L4-5. He presents today for surgery, He is without any concerns.  Patient Active Problem List   Diagnosis Date Noted  . Lumbar radiculopathy 05/22/2019  . Spastic gait 03/07/2019  . Myalgia 11/01/2018  . Chronic bilateral low back pain without sciatica 07/21/2018  . 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 Edema 03/01/2017  . Hypokalemia   . Slow transit constipation   . Steroid-induced hyperglycemia   . Urinary retention   . Accidental drug overdose   . Postoperative pain   . Hyponatremia   . Cervical myelopathy ([place]) 02/01/2017  . Shortness of breath at rest   . Hypoglycemia   . Spondylosis, cervical, with myelopathy   . Neuropathic pain   . Benign essential HTN   . Labile blood glucose   . Muscle spasm   . GAD (generalized anxiety disorder)   . Acute inflammatory demyelinating polyneuropathy ([place]) 01/11/2017  . Anxiety state   . Neurogenic bladder   . Depression 12/30/2016  . Leg weakness, bilateral 12/30/2016  . 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 GBS (Guillain Barre syndrome) ([place])   . AIDP (acute inflammatory demyelinating polyneuropathy) ([place]) 11/27/2016  . Weakness 11/20/2016  . Weakness of both arms 10/30/2016  . 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 Obstructive sleep  apnea 03/24/2016  . Numbness 03/18/2016  . Mild nonproliferative diabetic retinopathy of both eyes without macular edema associated with type 2 diabetes mellitus ([place]) 05/24/2015  . Nuclear cataract of both eyes 05/24/2015  . Diabetes mellitus without complication ([place]) 16/60/6004  . 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 Eustachian tube dysfunction 02/12/2015  . Acute maxillary sinusitis 02/12/2015  . Wellness examination 08/22/2014  . Alkaline phosphatase elevation 08/22/2014  . 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 Benign non-nodular prostatic hyperplasia with lower urinary tract symptoms 01/31/2014  . Lesion of eyelid 09/26/2013  . Screening for prostate cancer 04/24/2013  . Diabetes ([place]) 06/16/2012  . ED (erectile dysfunction) of organic origin 06/03/2012  . Routine general medical examination at a health care facility 04/10/2012  . BPH (benign prostatic hyperplasia) 02/03/2010  . HEMOCCULT POSITIVE STOOL 05/25/2008  . ELBOW PAIN, RIGHT 07/04/2007  . Dyslipidemia 02/16/2007  . ANXIETY 02/16/2007  . ERECTILE DYSFUNCTION 02/16/2007  . Essential hypertension 02/16/2007  . TESTICULAR MASS, LEFT 02/16/2007  . KNEE PAIN, CHRONIC 02/16/2007  . ADENOIDECTOMY, HX OF 02/16/2007    PMH: Past Medical History:  Diagnosis Date  . ADENOIDECTOMY, HX OF 02/16/2007  . Anxiety    pt. states he does not have anxiety.  Marland Kitchen BENIGN PROSTATIC HYPERTROPHY, WITH OBSTRUCTION 02/03/2010  . 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 DIABETES MELLITUS, TYPE I, UNCONTROLLED 12/29/2006  . ERECTILE DYSFUNCTION 02/16/2007  . Hearing loss    wears hearing aids  . HYPERLIPIDEMIA 02/16/2007  . HYPERTENSION 02/16/2007  . Nerve pain    per  patient "on lower back and both legs"  . Sleep apnea    uses CPAP  . TESTICULAR MASS, LEFT 02/16/2007    PSH: Past Surgical History:  Procedure Laterality Date  . ANTERIOR CERVICAL DECOMP/DISCECTOMY FUSION  N/A 02/01/2017   Procedure: ANTERIOR CERVICAL DECOMPRESSION/DISCECTOMY FUSION CERVICAL THREE-FOUR , CERVICAL FOUR-FIVE  CERVICAL FIVE-SIX;  Surgeon: Consuella Lose, MD;  Location: [facility];  Service: Neurosurgery;  Laterality: N/A;  . APPENDECTOMY    . COLONOSCOPY  02/05/2010   [facility]  . KNEE SURGERY     2 Lknee arthroscopy, 3 R knee arthroscpoy  . KNEE SURGERY Right 11/12/2016  . LUMBAR LAMINECTOMY/DECOMPRESSION MICRODISCECTOMY Right 05/22/2019   Procedure: LAMINOTOMY AND MICRODISCECTOMY RIGHT LUMBAR FOUR- LUMBAR FIVE;  Surgeon: Consuella Lose, MD;  Location: [facility];  Service: Neurosurgery;  Laterality: Right;  LAMINOTOMY AND MICRODISCECTOMY RIGHT LUMBAR FOUR- LUMBAR FIVE  . TONSILLECTOMY      Medications Prior to Admission  Medication Sig Dispense Refill Last Dose  . amitriptyline (ELAVIL) 75 MG tablet TAKE 1 TABLET(75 MG) BY MOUTH AT BEDTIME (Patient taking differently: Take 75 mg by mouth at bedtime.) 30 tablet 2 01/31/2020 at Unknown time  . B Complex Vitamins (B-COMPLEX/B-12 PO) Take 1 tablet by mouth at bedtime.   01/31/2020 at Unknown time  . baclofen (LIORESAL) 10 MG tablet Take 2 tablets (20 mg total) by mouth 3 (three) times daily. 540 tablet 3 01/31/2020 at Unknown time  . celecoxib (CELEBREX) 100 MG capsule TAKE 1 CAPSULE(100 MG) BY MOUTH AT BEDTIME (Patient taking differently: Take 100 mg by mouth at bedtime.) 30 capsule 2 01/31/2020 at Unknown time  . clotrimazole-betamethasone (LOTRISONE) cream Apply 1 application topically 2 (two) times daily. (Patient taking differently: Apply 1 application topically daily as needed (Dry skin).) 90 g 2 Past Week at Unknown time  . Cranberry 400 MG TABS Take 400 mg by mouth at bedtime.   01/31/2020 at Unknown time  . DULoxetine (CYMBALTA) 60 MG capsule TAKE 1 CAPSULE DAILY (Patient taking differently: Take 60 mg by mouth at bedtime.) 90 capsule 3 01/31/2020 at Unknown time  . finasteride (PROSCAR) 5 MG tablet Take 5 mg by mouth at bedtime.    01/31/2020 at Unknown time  . Insulin Human (INSULIN PUMP) SOLN Inject 120 each into the skin daily. HUMALOG   02/01/2020 at Unknown time  . lidocaine (LIDODERM) 5 % At 7 am and remove at 7 pm. Apply 1 on each side. (Patient taking differently: Place 2 patches onto the skin daily as needed (pain).) 180 patch 2 Past Week at Unknown time  . lisinopril-hydrochlorothiazide (ZESTORETIC) 10-12.5 MG tablet TAKE 1 TABLET DAILY. PLEASE SEE PRIMARY CARE PHYSICIAN FOR ANY FUTURE REFILLS (Patient taking differently: Take 1 tablet by mouth at bedtime.) 90 tablet 3 01/31/2020 at Unknown time  . methocarbamol (ROBAXIN-750) 750 MG tablet Take 1 tablet (750 mg total) by mouth 3 (three) times daily as needed for muscle spasms. 90 tablet 1 01/31/2020 at Unknown time  . Multiple Vitamin (MULTIVITAMIN) capsule Take 1 capsule by mouth at bedtime.   01/31/2020 at Unknown time  . traMADol (ULTRAM) 50 MG tablet Take 50 mg by mouth every 6 (six) hours as needed for severe pain or moderate pain.   Past Week at Unknown time  . glucagon 1 MG injection Inject 1 mg into the vein once as needed. (Patient taking differently: Inject 1 mg into the vein once as needed (Low blood gluose).) 1 each 12 More than a month at Unknown time  . glucose  blood (BAYER CONTOUR NEXT TEST) test strip MEDICALLY NECESSARY FOR USE WITH PUMP; Use to check blood sugar 5 times per day and prn; E11.42 500 each 3   . HYDROcodone-acetaminophen (NORCO/VICODIN) 5-325 MG tablet Take 1-2 tablets by mouth every 6 (six) hours as needed. (Patient not taking: No sig reported) 12 tablet 0 Not Taking at Unknown time  . Immune Globulin 10% (IMMUNE GLOBULIN 10%) 10G/152mL (10,000mg /145mL) SOLN Inject 105 g into the vein every 6 (six) weeks. 289.5 mL 10 More than a month at Unknown time  . Insulin Infusion Pump Supplies (PARADIGM RESERVOIR 3ML) MISC 1 Device by Does not apply route every 3 (three) days. 30 each 3   . Insulin Infusion Pump Supplies (QUICK-SET INFUSION 43"  9MM) MISC 1 Device by Does not apply route every 3 (three) days. 30 each 3   . insulin lispro (HUMALOG) 100 UNIT/ML injection INJECT 120 UNITS DAILY IN PUMP (Patient not taking: Reported on 01/31/2020) 120 mL 3 Not Taking at Unknown time  . scopolamine (TRANSDERM-SCOP) 1 MG/3DAYS Place 1 patch (1.5 mg total) onto the skin every 3 (three) days. (Patient not taking: Reported on 01/31/2020) 10 patch 12 Not Taking at Unknown time    SH: Social History   Tobacco Use  . Smoking status: Never Smoker  . Smokeless tobacco: Never Used  Vaping Use  . Vaping Use: Never used  Substance Use Topics  . Alcohol use: No  . Drug use: No    MEDS: Prior to Admission medications   Medication Sig Start Date End Date Taking? Authorizing Provider  amitriptyline (ELAVIL) 75 MG tablet TAKE 1 TABLET(75 MG) BY MOUTH AT BEDTIME Patient taking differently: Take 75 mg by mouth at bedtime. 11/27/19  Yes Jamse Arn, MD  B Complex Vitamins (B-COMPLEX/B-12 PO) Take 1 tablet by mouth at bedtime.   Yes [provider]  baclofen (LIORESAL) 10 MG tablet Take 2 tablets (20 mg total) by mouth 3 (three) times daily. 05/29/19  Yes Patel, Donika K, DO  celecoxib (CELEBREX) 100 MG capsule TAKE 1 CAPSULE(100 MG) BY MOUTH AT BEDTIME Patient taking differently: Take 100 mg by mouth at bedtime. 03/07/19  Yes Jamse Arn, MD  clotrimazole-betamethasone (LOTRISONE) cream Apply 1 application topically 2 (two) times daily. Patient taking differently: Apply 1 application topically daily as needed (Dry skin). 11/02/18  Yes Renato Shin, MD  Cranberry 400 MG TABS Take 400 mg by mouth at bedtime.   Yes [provider]  DULoxetine (CYMBALTA) 60 MG capsule TAKE 1 CAPSULE DAILY Patient taking differently: Take 60 mg by mouth at bedtime. 04/04/19  Yes Jamse Arn, MD  finasteride (PROSCAR) 5 MG tablet Take 5 mg by mouth at bedtime.   Yes [provider]  Insulin Human (INSULIN PUMP) SOLN Inject 120  each into the skin daily. HUMALOG   Yes [provider]  lidocaine (LIDODERM) 5 % At 7 am and remove at 7 pm. Apply 1 on each side. Patient taking differently: Place 2 patches onto the skin daily as needed (pain). 04/25/19  Yes Jamse Arn, MD  lisinopril-hydrochlorothiazide (ZESTORETIC) 10-12.5 MG tablet TAKE 1 TABLET DAILY. PLEASE SEE PRIMARY CARE PHYSICIAN FOR ANY FUTURE REFILLS Patient taking differently: Take 1 tablet by mouth at bedtime. 06/02/19  Yes Shelda Pal, DO  methocarbamol (ROBAXIN-750) 750 MG tablet Take 1 tablet (750 mg total) by mouth 3 (three) times daily as needed for muscle spasms. 05/23/19  Yes Costella, Vista Mink, PA-C  Multiple Vitamin (MULTIVITAMIN) capsule Take  1 capsule by mouth at bedtime.   Yes [provider]  traMADol (ULTRAM) 50 MG tablet Take 50 mg by mouth every 6 (six) hours as needed for severe pain or moderate pain. 01/29/20  Yes [provider]  glucagon 1 MG injection Inject 1 mg into the vein once as needed. Patient taking differently: Inject 1 mg into the vein once as needed (Low blood gluose). 07/25/13   Renato Shin, MD  glucose blood (BAYER CONTOUR NEXT TEST) test strip MEDICALLY NECESSARY FOR USE WITH PUMP; Use to check blood sugar 5 times per day and prn; E11.42 02/03/18   Renato Shin, MD  HYDROcodone-acetaminophen (NORCO/VICODIN) 5-325 MG tablet Take 1-2 tablets by mouth every 6 (six) hours as needed. Patient not taking: No sig reported 01/22/20   Dorie Rank, MD  Immune Globulin 10% (IMMUNE GLOBULIN 10%) 10G/190mL (10,000mg /169mL) SOLN Inject 105 g into the vein every 6 (six) weeks. 07/20/19   Patel, Arvin Collard K, DO  Insulin Infusion Pump Supplies (PARADIGM RESERVOIR 3ML) MISC 1 Device by Does not apply route every 3 (three) days. 08/31/18   Renato Shin, MD  Insulin Infusion Pump Supplies (QUICK-SET INFUSION 43" 9MM) MISC 1 Device by Does not apply route every 3 (three) days. 08/31/18   Renato Shin, MD  insulin lispro  (HUMALOG) 100 UNIT/ML injection INJECT 120 UNITS DAILY IN PUMP Patient not taking: Reported on 01/31/2020 10/23/19   Renato Shin, MD  scopolamine (TRANSDERM-SCOP) 1 MG/3DAYS Place 1 patch (1.5 mg total) onto the skin every 3 (three) days. Patient not taking: Reported on 01/31/2020 03/24/19   Shelda Pal, DO    ALLERGY: No Known Allergies  Social History   Tobacco Use  . Smoking status: Never Smoker  . Smokeless tobacco: Never Used  Substance Use Topics  . Alcohol use: No     Family History  Problem Relation Age of Onset  . Dementia Mother   . Diabetes Mellitus I Mother   . Hypertension Father        67  . Healthy Sister   . Rheum arthritis Brother   . Cancer Neg Hx   . Colon cancer Neg Hx   . Esophageal cancer Neg Hx   . Stomach cancer Neg Hx   . Rectal cancer Neg Hx   . Colon polyps Neg Hx      ROS   ROS  Exam   Vitals:   02/01/20 1141  BP: 129/82  Pulse: 79  Resp: 18  Temp: (!) 97.5 F (36.4 C)  SpO2: 98%   General appearance: WDWN, NAD Eyes: No scleral injection Cardiovascular: Regular rate and rhythm without murmurs, rubs, gallops. No edema or variciosities. Distal pulses normal. Pulmonary: Effort normal, non-labored breathing Musculoskeletal:     Muscle tone upper extremities: Normal    Muscle tone lower extremities: Normal    Motor exam: Upper Extremities Deltoid Bicep Tricep Grip  Right 5/5 5/5 5/5 5/5  Left 5/5 5/5 5/5 5/5   Lower Extremity IP Quad PF DF EHL  Right 5/5 5/5 5/5 4-/5 4-/5  Left 5/5 5/5 5/5 5/5 5/5   Neurological Mental Status:    - Patient is awake, alert, oriented to person, place, month, year, and situation    - Patient is able to give a clear and coherent history.    - No signs of aphasia or neglect Cranial Nerves    - II: Visual Fields are full. PERRL    - III/IV/VI: EOMI without ptosis or diploplia.     -  V: Facial sensation is grossly normal    - VII: Facial movement is symmetric.     - VIII: hearing is  intact to voice    - X: Uvula elevates symmetrically    - XI: Shoulder shrug is symmetric.    - XII: tongue is midline without atrophy or fasciculations.  Sensory: Sensation grossly intact to LT  Results - Imaging/Labs   Results for orders placed or performed during the hospital encounter of 02/01/20 (from the past 48 hour(s))  SARS Coronavirus 2 by RT PCR (hospital order, performed in [facility] hospital lab) Nasopharyngeal Nasopharyngeal Swab     Status: None   Collection Time: 02/01/20 11:13 AM   Specimen: Nasopharyngeal Swab  Result Value Ref Range   SARS Coronavirus 2 NEGATIVE NEGATIVE    Comment: (NOTE) SARS-CoV-2 target nucleic acids are NOT DETECTED.  The SARS-CoV-2 RNA is generally detectable in upper and lower respiratory specimens during the acute phase of infection. The lowest concentration of SARS-CoV-2 viral copies this assay can detect is 250 copies / mL. A negative result does not preclude SARS-CoV-2 infection and should not be used as the sole basis for treatment or other patient management decisions.  A negative result may occur with improper specimen collection / handling, submission of specimen other than nasopharyngeal swab, presence of viral mutation(s) within the areas targeted by this assay, and inadequate number of viral copies (<250 copies / mL). A negative result must be combined with clinical observations, patient history, and epidemiological information.  Fact Sheet for Patients:   StrictlyIdeas.no  Fact Sheet for Healthcare Providers: BankingDealers.co.za  This test is not yet approved or  cleared by the [place] FDA and has been authorized for detection and/or diagnosis of SARS-CoV-2 by FDA under an Emergency Use Authorization (EUA).  This EUA will remain in effect (meaning this test can be used) for the duration of the COVID-19 declaration under Section 564(b)(1) of the Act, 21 U.S.C. section  360bbb-3(b)(1), unless the authorization is terminated or revoked sooner.  Performed at [facility], [place] [address], [place], [place] [address]   Glucose, capillary     Status: Abnormal   Collection Time: 02/01/20 11:44 AM  Result Value Ref Range   Glucose-Capillary 62 (L) 70 - 99 mg/dL    Comment: Glucose reference range applies only to samples taken after fasting for at least 8 hours.  Glucose, capillary     Status: Abnormal   Collection Time: 02/01/20 12:18 PM  Result Value Ref Range   Glucose-Capillary 120 (H) 70 - 99 mg/dL    Comment: Glucose reference range applies only to samples taken after fasting for at least 8 hours.  CBC per protocol     Status: Abnormal   Collection Time: 02/01/20 12:25 PM  Result Value Ref Range   WBC 9.1 4.0 - 10.5 K/uL   RBC 5.80 4.22 - 5.81 MIL/uL   Hemoglobin 17.1 (H) 13.0 - 17.0 g/dL   HCT 51.2 39.0 - 52.0 %   MCV 88.3 80.0 - 100.0 fL   MCH 29.5 26.0 - 34.0 pg   MCHC 33.4 30.0 - 36.0 g/dL   RDW 13.7 11.5 - 15.5 %   Platelets 225 150 - 400 K/uL   nRBC 0.0 0.0 - 0.2 %    Comment: Performed at [facility], [place] [address] [place], [place] [address]  Basic metabolic panel per protocol     Status: Abnormal   Collection Time: 02/01/20 12:25 PM  Result  Value Ref Range   Sodium 136 135 - 145 mmol/L   Potassium 3.9 3.5 - 5.1 mmol/L   Chloride 97 (L) 98 - 111 mmol/L   CO2 27 22 - 32 mmol/L   Glucose, Bld 68 (L) 70 - 99 mg/dL    Comment: Glucose reference range applies only to samples taken after fasting for at least 8 hours.   BUN 20 8 - 23 mg/dL   Creatinine, Ser 0.98 0.61 - 1.24 mg/dL   Calcium 9.0 8.9 - 10.3 mg/dL   GFR, Estimated >60 >60 mL/min    Comment: (NOTE) Calculated using the CKD-EPI Creatinine Equation (2021)    Anion gap 12 5 - 15    Comment: Performed at [facility] [address], [place], [place] [address]  Glucose, capillary     Status: None   Collection Time: 02/01/20 12:59 PM  Result Value Ref Range    Glucose-Capillary 89 70 - 99 mg/dL    Comment: Glucose reference range applies only to samples taken after fasting for at least 8 hours.   Comment 1 Notify RN    Comment 2 Document in Chart   Glucose, capillary     Status: None   Collection Time: 02/01/20  1:17 PM  Result Value Ref Range   Glucose-Capillary 74 70 - 99 mg/dL    Comment: Glucose reference range applies only to samples taken after fasting for at least 8 hours.    No results found.  IMAGING: MRI lumbar spine 01/30/2020 was reviewed. Primary pathology at L4-5 where there is a large right eccentric disc bulge with superiorly migrated free disc fragment within right L4-5 foramen. Likely compression of both traversing L4 and L5 nerve roots.   Impression/Plan   - 69 y.o. male with progressively worsening right sided back and leg pain with partial right foot drop likely secondary to large recurrent right L4-5 disc herniation. Given motor weakness, surgical decompression was recommended. We will proceed with redo right sided L4-5 laminotomy and microdiscectomy.  We have reviewed the indications for surgery, the associated risks, benefits and alternatives at length in the office.  All questions today were answered and consent was obtained.  Consuella Lose, MD [facility]

## 2020-02-01 NOTE — Progress Notes (Addendum)
Hypoglycemic Event  CBG: 62  Treatment: D50 25 mL (12.5 gm) Will give when IV access achieved  Symptoms: None  Follow-up CBG: Time:1218 CBG Result:120  Possible Reasons for Event: Inadequate meal intake  Comments/MD notified: Dr Nyoka Cowden notified    Morgen Linebaugh Margaretha Sheffield

## 2020-02-01 NOTE — Progress Notes (Signed)
Inpatient Diabetes Program Recommendations  AACE/ADA: New Consensus Statement on Inpatient Glycemic Control (2015)  Target Ranges:  Prepandial:   less than 140 mg/dL      Peak postprandial:   less than 180 mg/dL (1-2 hours)      Critically ill patients:  140 - 180 mg/dL   Lab Results  Component Value Date   GLUCAP 97 02/01/2020   HGBA1C 8.1 (A) 01/24/2020    Review of Glycemic Control Results for Kenneth Pace, Kenneth Pace" (MRN [id number]) as of 02/01/2020 15:55  Ref. Range 02/01/2020 12:59 02/01/2020 13:17 02/01/2020 13:48 02/01/2020 14:20  Glucose-Capillary Latest Ref Range: 70 - 99 mg/dL 89 74 69 (L) 97   Diabetes history: Type 1 DM Outpatient Diabetes medications:  Insulin pump (per notes from last Endocrinology visit) Basal 4.5 units/hr 6a-10 p,             2.6 units/hr 10p-6a CHO coverage 1 unit/7 grams of CHO Sensitivity: 1 unit drops blood sugar 50 mg/dL (Goal=100 mg/dL)  Current orders for Inpatient glycemic control:  None- patient in OR  Inpatient Diabetes Program Recommendations:    Note patient wears insulin pump and has hx. Of Type 1 DM.   Will need order set for Insulin pump upon admission to the floor.   Will follow up on 12/17.   Thanks,  Adah Perl, RN, BC-ADM Inpatient Diabetes Coordinator Pager [phone] (8a-5p)

## 2020-02-01 NOTE — Progress Notes (Signed)
Pt arrived to 4NP01 from PACU post-back surgery with Nundkumar. Pt is alert and orientedx4, vitals taken and WNL, full assessment completed. Honeycomb on back is c/d/i. Only other skin problem noted is abrasions on each cheek with first layer of skin missing. Bacitracin ordered. Pt advanced to clear liquid diet, per written orders. Wife and belongings at bedside.   Justice Rocher, RN

## 2020-02-01 NOTE — Anesthesia Procedure Notes (Signed)
Procedure Name: Intubation Date/Time: 02/01/2020 2:43 PM Performed by: Candis Shine, CRNA Pre-anesthesia Checklist: Patient identified, Emergency Drugs available, Suction available and Patient being monitored Patient Re-evaluated:Patient Re-evaluated prior to induction Oxygen Delivery Method: Circle System Utilized Preoxygenation: Pre-oxygenation with 100% oxygen Induction Type: IV induction Ventilation: Oral airway inserted - appropriate to patient size and Two handed mask ventilation required Laryngoscope Size: Mac and 4 Grade View: Grade II Tube type: Oral Tube size: 7.5 mm Number of attempts: 1 Airway Equipment and Method: Stylet and Oral airway Placement Confirmation: ETT inserted through vocal cords under direct vision,  positive ETCO2 and breath sounds checked- equal and bilateral Secured at: 22 cm Tube secured with: Tape Dental Injury: Teeth and Oropharynx as per pre-operative assessment

## 2020-02-01 NOTE — Anesthesia Preprocedure Evaluation (Signed)
Anesthesia Evaluation  Patient identified by MRN, date of birth, ID band Patient awake    Reviewed: Allergy & Precautions, NPO status , Patient's Chart, lab work & pertinent test results  Airway Mallampati: II  TM Distance: >3 FB Neck ROM: Full    Dental  (+) Dental Advisory Given   Pulmonary sleep apnea ,    breath sounds clear to auscultation       Cardiovascular hypertension, Pt. on medications  Rhythm:Regular Rate:Normal     Neuro/Psych  Neuromuscular disease    GI/Hepatic negative GI ROS, Neg liver ROS,   Endo/Other  diabetes, Type 1, Insulin Dependent  Renal/GU negative Renal ROS     Musculoskeletal  (+) Arthritis ,   Abdominal   Peds  Hematology negative hematology ROS (+)   Anesthesia Other Findings   Reproductive/Obstetrics                             Lab Results  Component Value Date   WBC 11.4 (H) 05/23/2019   HGB 13.0 05/23/2019   HCT 37.5 (L) 05/23/2019   MCV 90.6 05/23/2019   PLT 162 05/23/2019   Lab Results  Component Value Date   CREATININE 0.95 05/23/2019   BUN 20 05/23/2019   NA 137 05/23/2019   K 4.4 05/23/2019   CL 103 05/23/2019   CO2 24 05/23/2019    Anesthesia Physical Anesthesia Plan  ASA: III  Anesthesia Plan: General   Post-op Pain Management:    Induction: Intravenous  PONV Risk Score and Plan: 2 and Dexamethasone, Ondansetron and Treatment may vary due to age or medical condition  Airway Management Planned: Oral ETT  Additional Equipment: None  Intra-op Plan:   Post-operative Plan: Extubation in OR  Informed Consent: I have reviewed the patients History and Physical, chart, labs and discussed the procedure including the risks, benefits and alternatives for the proposed anesthesia with the patient or authorized representative who has indicated his/her understanding and acceptance.     Dental advisory given  Plan Discussed with:  CRNA  Anesthesia Plan Comments:         Anesthesia Quick Evaluation

## 2020-02-01 NOTE — Progress Notes (Addendum)
Hypoglycemic Event  CBG: 69  Treatment: D50 25 mL (12.5 gm)  Symptoms: None  Follow-up CBG: Time:  CBG Result:  Possible Reasons for Event: Inadequate meal intake  Comments/MD notified:    Naser Schuld Margaretha Sheffield  Reported off to [place], [employer]

## 2020-02-01 NOTE — Transfer of Care (Signed)
Immediate Anesthesia Transfer of Care Note  Patient: Kenneth Pace  Procedure(s) Performed: REDO MICRODISCECTOMY RT LUMBAR FOUR-FIVE (Right Spine Lumbar)  Patient Location: PACU  Anesthesia Type:General  Level of Consciousness: drowsy  Airway & Oxygen Therapy: Patient Spontanous Breathing and Patient connected to face mask oxygen  Post-op Assessment: Report given to RN and Post -op Vital signs reviewed and stable  Post vital signs: Reviewed and stable  Last Vitals:  Vitals Value Taken Time  BP 150/96 02/01/20 1635  Temp 36.5 C 02/01/20 1635  Pulse 86 02/01/20 1644  Resp 18 02/01/20 1644  SpO2 92 % 02/01/20 1644  Vitals shown include unvalidated device data.  Last Pain:  Vitals:   02/01/20 1635  TempSrc:   PainSc: 2       Patients Stated Pain Goal: 4 (96/88/64 8472)  Complications: No complications documented.

## 2020-02-02 ENCOUNTER — Encounter (HOSPITAL_COMMUNITY): Payer: Self-pay | Admitting: Neurosurgery

## 2020-02-02 DIAGNOSIS — I10 Essential (primary) hypertension: Secondary | ICD-10-CM | POA: Diagnosis not present

## 2020-02-02 DIAGNOSIS — M5126 Other intervertebral disc displacement, lumbar region: Secondary | ICD-10-CM | POA: Diagnosis not present

## 2020-02-02 DIAGNOSIS — E109 Type 1 diabetes mellitus without complications: Secondary | ICD-10-CM | POA: Diagnosis not present

## 2020-02-02 DIAGNOSIS — Z794 Long term (current) use of insulin: Secondary | ICD-10-CM | POA: Diagnosis not present

## 2020-02-02 DIAGNOSIS — Z79899 Other long term (current) drug therapy: Secondary | ICD-10-CM | POA: Diagnosis not present

## 2020-02-02 DIAGNOSIS — Z20822 Contact with and (suspected) exposure to covid-19: Secondary | ICD-10-CM | POA: Diagnosis not present

## 2020-02-02 LAB — BASIC METABOLIC PANEL
Anion gap: 11 (ref 5–15)
BUN: 17 mg/dL (ref 8–23)
CO2: 27 mmol/L (ref 22–32)
Calcium: 8.7 mg/dL — ABNORMAL LOW (ref 8.9–10.3)
Chloride: 97 mmol/L — ABNORMAL LOW (ref 98–111)
Creatinine, Ser: 0.87 mg/dL (ref 0.61–1.24)
GFR, Estimated: >60 mL/min (ref >60)
Glucose, Bld: 163 mg/dL — ABNORMAL HIGH (ref 70–99)
Potassium: 4.5 mmol/L (ref 3.5–5.1)
Sodium: 135 mmol/L (ref 135–145)

## 2020-02-02 LAB — CBC
HCT: 46.1 % (ref 39.0–52.0)
Hemoglobin: 16.4 g/dL (ref 13.0–17.0)
MCH: 30.5 pg (ref 26.0–34.0)
MCHC: 35.6 g/dL (ref 30.0–36.0)
MCV: 85.8 fL (ref 80.0–100.0)
Platelets: 228 10*3/uL (ref 150–400)
RBC: 5.37 MIL/uL (ref 4.22–5.81)
RDW: 13.4 % (ref 11.5–15.5)
WBC: 12.1 10*3/uL — ABNORMAL HIGH (ref 4.0–10.5)
nRBC: 0 % (ref 0.0–0.2)

## 2020-02-02 LAB — PROTIME-INR
INR: 1.1 (ref 0.8–1.2)
Prothrombin Time: 13.4 seconds (ref 11.4–15.2)

## 2020-02-02 LAB — GLUCOSE, CAPILLARY
Glucose-Capillary: 129 mg/dL — ABNORMAL HIGH (ref 70–99)
Glucose-Capillary: 212 mg/dL — ABNORMAL HIGH (ref 70–99)
Glucose-Capillary: 96 mg/dL (ref 70–99)
Glucose-Capillary: 202 mg/dL — ABNORMAL HIGH (ref 70–99)

## 2020-02-02 LAB — APTT: aPTT: 33 seconds (ref 24–36)

## 2020-02-02 MED ORDER — OXYCODONE-ACETAMINOPHEN 7.5-325 MG PO TABS: 1.0000 | ORAL_TABLET | ORAL | 0 refills | Status: DC | PRN

## 2020-02-02 MED ORDER — METHOCARBAMOL 750 MG PO TABS: 750.0000 mg | ORAL_TABLET | Freq: Three times a day (TID) | ORAL | 1 refills | Status: DC | PRN

## 2020-02-02 NOTE — Progress Notes (Signed)
  NEUROSURGERY PROGRESS NOTE   No issues overnight. Complains of appropriate back sonress Pre op RLE pain and weakness resolved Voiding normal. Tolerating po. Ambulating well  EXAM:  BP 111/60 (BP Location: Left Arm)   Pulse 88   Temp 97.7 F (36.5 C)   Resp 18   Ht 6' (1.829 m)   Wt 106.6 kg   SpO2 93%   BMI 31.87 kg/m   Awake, alert, oriented  Speech fluent, appropriate  CN grossly intact  5/5 BUE/BLE  Incision: c/d/i  IMPRESSION/PLAN 69 y.o. male  POD1 L4-5 microdisc. Doing well - d/c home

## 2020-02-02 NOTE — Discharge Summary (Signed)
Physician Discharge Summary  Patient ID: Kenneth Pace MRN: [id number] DOB/AGE: Nov 21, 1950 69 y.o.  Admit date: 02/01/2020 Discharge date: 02/02/2020  Admission Diagnoses:  Lumbar HNP  Discharge Diagnoses:  Same Active Problems:   Lumbar herniated disc   Discharged Condition: Stable  Hospital Course:  Kenneth Pace is a 69 y.o. male who was admitted for the below procedure. There were no post operative complications. At time of discharge, pain was well controlled, ambulating with Pt/OT, tolerating po, voiding normal. Ready for discharge.  Treatments: Surgery - Right L4-5 microdisc  Discharge Exam: Blood pressure 111/60, pulse 88, temperature 97.7 F (36.5 C), resp. rate 18, height 6' (1.829 m), weight 106.6 kg, SpO2 93 %. Awake, alert, oriented Speech fluent, appropriate CN grossly intact 5/5 BUE/BLE Wound c/d/i  Disposition: Discharge disposition: 01-Home or Self Care       Discharge Instructions    Call MD for:  difficulty breathing, headache or visual disturbances   Complete by: As directed    Call MD for:  persistant dizziness or light-headedness   Complete by: As directed    Call MD for:  redness, tenderness, or signs of infection (pain, swelling, redness, odor or green/yellow discharge around incision site)   Complete by: As directed    Call MD for:  severe uncontrolled pain   Complete by: As directed    Call MD for:  temperature >100.4   Complete by: As directed    Diet - low sodium heart healthy   Complete by: As directed    Driving Restrictions   Complete by: As directed    Do not drive until given clearance.   Increase activity slowly   Complete by: As directed    Lifting restrictions   Complete by: As directed    Do not lift anything >10lbs. Avoid bending and twisting in awkward positions. Avoid bending at the back.   May shower / Bathe   Complete by: As directed    In 24 hours. Okay to wash wound with warm soapy water. Avoid  scrubbing the wound. Pat dry.   Remove dressing in 48 hours   Complete by: As directed      Allergies as of 02/02/2020   No Known Allergies     Medication List    STOP taking these medications   HYDROcodone-acetaminophen 5-325 MG tablet Commonly known as: NORCO/VICODIN     TAKE these medications   amitriptyline 75 MG tablet Commonly known as: ELAVIL TAKE 1 TABLET(75 MG) BY MOUTH AT BEDTIME What changed: See the new instructions.   B-COMPLEX/B-12 PO Take 1 tablet by mouth at bedtime.   baclofen 10 MG tablet Commonly known as: LIORESAL Take 2 tablets (20 mg total) by mouth 3 (three) times daily.   celecoxib 100 MG capsule Commonly known as: CELEBREX TAKE 1 CAPSULE(100 MG) BY MOUTH AT BEDTIME What changed:   how much to take  how to take this  when to take this  additional instructions   clotrimazole-betamethasone cream Commonly known as: LOTRISONE Apply 1 application topically 2 (two) times daily. What changed:   when to take this  reasons to take this   Cranberry 400 MG Tabs Take 400 mg by mouth at bedtime.   DULoxetine 60 MG capsule Commonly known as: CYMBALTA TAKE 1 CAPSULE DAILY What changed: when to take this   finasteride 5 MG tablet Commonly known as: PROSCAR Take 5 mg by mouth at bedtime.   glucagon 1 MG injection Inject 1 mg into the vein  once as needed. What changed: reasons to take this   glucose blood test strip Commonly known as: Visual merchandiser Next Test MEDICALLY NECESSARY FOR USE WITH PUMP; Use to check blood sugar 5 times per day and prn; E11.42   Immune Globulin 10% 10G/142mL (10,000mg /170mL) Soln Generic drug: Immune Globulin 10% Inject 105 g into the vein every 6 (six) weeks.   insulin lispro 100 UNIT/ML injection Commonly known as: HumaLOG INJECT 120 UNITS DAILY IN PUMP   insulin pump Soln Inject 120 each into the skin daily. HUMALOG   lidocaine 5 % Commonly known as: LIDODERM At 7 am and remove at 7 pm. Apply 1 on  each side. What changed:   how much to take  how to take this  when to take this  reasons to take this  additional instructions   lisinopril-hydrochlorothiazide 10-12.5 MG tablet Commonly known as: ZESTORETIC TAKE 1 TABLET DAILY. PLEASE SEE PRIMARY CARE PHYSICIAN FOR ANY FUTURE REFILLS What changed: See the new instructions.   methocarbamol 750 MG tablet Commonly known as: Robaxin-750 Take 1 tablet (750 mg total) by mouth 3 (three) times daily as needed for muscle spasms. What changed: Another medication with the same name was added. Make sure you understand how and when to take each.   methocarbamol 750 MG tablet Commonly known as: Robaxin-750 Take 1 tablet (750 mg total) by mouth 3 (three) times daily as needed for muscle spasms. What changed: You were already taking a medication with the same name, and this prescription was added. Make sure you understand how and when to take each.   multivitamin capsule Take 1 capsule by mouth at bedtime.   oxyCODONE-acetaminophen 7.5-325 MG tablet Commonly known as: Percocet Take 1 tablet by mouth every 4 (four) hours as needed for severe pain.   PARADIGM RESERVOIR 3ML Misc 1 Device by Does not apply route every 3 (three) days.   Quick-set Infusion 43" 59mm Misc 1 Device by Does not apply route every 3 (three) days.   scopolamine 1 MG/3DAYS Commonly known as: TRANSDERM-SCOP Place 1 patch (1.5 mg total) onto the skin every 3 (three) days.   traMADol 50 MG tablet Commonly known as: ULTRAM Take 50 mg by mouth every 6 (six) hours as needed for severe pain or moderate pain.       Follow-up Information    Consuella Lose, MD. Schedule an appointment as soon as possible for a visit in 3 week(s).   Specialty: Neurosurgery Contact information: [address] [place] [address] [phone]               Signed: Traci Sermon 02/02/2020, 7:49 AM

## 2020-02-02 NOTE — Anesthesia Postprocedure Evaluation (Signed)
Anesthesia Post Note  Patient: Kenneth Pace  Procedure(s) Performed: REDO MICRODISCECTOMY RT LUMBAR FOUR-FIVE (Right Spine Lumbar)     Patient location during evaluation: PACU Anesthesia Type: General Level of consciousness: awake and alert Pain management: pain level controlled Vital Signs Assessment: post-procedure vital signs reviewed and stable Respiratory status: spontaneous breathing, nonlabored ventilation, respiratory function stable and patient connected to nasal cannula oxygen Cardiovascular status: blood pressure returned to baseline and stable Postop Assessment: no apparent nausea or vomiting Anesthetic complications: no   No complications documented.  Last Vitals:  Vitals:   02/02/20 0303 02/02/20 0807  BP: 111/60 112/63  Pulse: 88 78  Resp:  14  Temp: 36.5 C 36.6 C  SpO2: 93% 97%    Last Pain:  Vitals:   02/02/20 0800  TempSrc:   PainSc: 2                  Tiajuana Amass

## 2020-02-02 NOTE — Evaluation (Signed)
Physical Therapy Evaluation Patient Details Name: Kenneth Pace MRN: [id number] DOB: 1950-03-31 Today's Date: 02/02/2020   History of Present Illness  Kenneth Pace is a 69 y.o. male who previously underwent right L4-5 laminotomy and microdiscectomy approximately 9 months ago for a large disc herniation who was doing well up until several weeks ago when he developed severe recurrent lower back and right leg pain associated with right foot weakness. Am MRI L spine revealed large recurrent disc herniation at L4-5. Pt underwent repeat microdiscectomy at L4-5 on 12/17.    Clinical Impression  Pt in bed upon arrival of PT, agreeable to evaluation at this time. Prior to admission the pt was mobilizing with use of WC due to significant pain, but reports he was able to transfer and complete ADLs independently and had been walking with cane in recent history. The pt lives at home with his wife who can be with his 24/7, but has 17 steps to navigate to reach his bedroom and reports multiple falls a month at baseline. The pt now presents with limitations in functional mobility, RLE strength and coordination, dynamic stability, and safety awareness due to above dx, and will continue to benefit from skilled PT to address these deficits. The pt was able to complete initial bed mobility and transfers without assist, but does rely on frequent cues for maintaining spinal precautions with movement. He was able to complete hallway ambulation and stair training with minG and use of cane, but requires frequent cues for safety, and intermittent minA to maintain balance. The pt verbalizes agreement with education on safety with mobility, moving slowly, and relying on assist from wife to increase safety, but required significant cueing to act on this education through session. The pt does also present with decreased strength and endurance in his RLE that results in progressive buckling with continued mobility, and will  continue to benefit from skilled PT to progress functional strength, endurance, and safety with mobility to reduce risk of falls at home.      Follow Up Recommendations Outpatient PT;Supervision/Assistance - 24 hour    Equipment Recommendations  None recommended by PT (pt well equipped)    Recommendations for Other Services       Precautions / Restrictions Precautions Precautions: Fall;Back Precaution Booklet Issued: Yes (comment) Precaution Comments: no brace needed Restrictions Weight Bearing Restrictions: No      Mobility  Bed Mobility Overal bed mobility: Needs Assistance Bed Mobility: Rolling;Sidelying to Sit;Sit to Sidelying Rolling: Modified independent (Device/Increase time) Sidelying to sit: Supervision     Sit to sidelying: Supervision General bed mobility comments: Supervision with cueing for log rolling, good carryover initially but needed cues when returning back to bed due to quick movements and attempting to twist spine    Transfers Overall transfer level: Needs assistance Equipment used: Straight cane Transfers: Sit to/from Stand;Stand Pivot Transfers Sit to Stand: Min guard Stand pivot transfers: Min guard       General transfer comment: min guard for safety in standing due to reported R LE weakness, a bit impulsive, lifitng cane over IV line. Cues to slow pace and safe sequencing  Ambulation/Gait Ambulation/Gait assistance: Min guard Gait Distance (Feet): 200 Feet Assistive device: Straight cane Gait Pattern/deviations: Step-through pattern;Decreased stride length;Decreased weight shift to right;Decreased stance time - right   Gait velocity interpretation: <1.8 ft/sec, indicate of risk for recurrent falls General Gait Details: decreased coordination with RLE, onset of buckling with  exertion >100 ft requiring seated rest. pt given cues for  3-point gait with cane and RLE moving together to improve safety and support  Stairs Stairs: Yes Stairs  assistance: Min guard;Min assist Stair Management: One rail Right;With cane;Step to pattern;Forwards Number of Stairs: 2 (x5) General stair comments: pt eager on border of impulsivity with stairs, able to complete step-to safely but pt attempting alternating pattern and requiring min/modA to regain balance.  Wheelchair Mobility    Modified Rankin (Stroke Patients Only)       Balance Overall balance assessment: Needs assistance;History of Falls Sitting-balance support: No upper extremity supported;Feet supported Sitting balance-Leahy Scale: Good     Standing balance support: Single extremity supported;During functional activity Standing balance-Leahy Scale: Fair Standing balance comment: fair static standing, benefits from UE support for dynamic tasks                             Pertinent Vitals/Pain Pain Assessment: 0-10 Pain Score: 2  Pain Location: surgical site, low back Pain Descriptors / Indicators: Grimacing Pain Intervention(s): Monitored during session;Repositioned    Home Living Family/patient expects to be discharged to:: Private residence Living Arrangements: Spouse/significant other Available Help at Discharge: Family;Available 24 hours/day Type of Home: House Home Access: Stairs to enter Entrance Stairs-Rails: Left;Right;Can reach both Entrance Stairs-Number of Steps: 1 with rails in garage, 3 with rails at front Home Layout: Two level Home Equipment: [place] - 2 wheels;Cane - single point;Shower seat;Grab bars - toilet;Grab bars - tub/shower;Hand held shower head;Adaptive equipment;Walker - 4 wheels;Toilet riser;Wheelchair - manual      Prior Function Level of Independence: Independent with assistive device(s)         Comments: using WC most reccently, but able to compelte transfers independently, has used cane in past. pt reports 1-2 falls per month. Pt works full time with [employer] (retiring soon). Reports independence with ADLs, IADLs      Hand Dominance   Dominant Hand: Right    Extremity/Trunk Assessment   Upper Extremity Assessment Upper Extremity Assessment: Overall WFL for tasks assessed    Lower Extremity Assessment Lower Extremity Assessment: Defer to PT evaluation RLE Deficits / Details: slightly decreased stregnth. 4+/5 with MMT but with frequent buckling after ~80 ft of gait requiring seated rest RLE Sensation: WNL (pt denies difference in sensation) RLE Coordination: decreased fine motor    Cervical / Trunk Assessment Cervical / Trunk Assessment: Normal Cervical / Trunk Exceptions: s/p spine surgery  Communication   Communication: No difficulties  Cognition Arousal/Alertness: Awake/alert Behavior During Therapy: WFL for tasks assessed/performed;Impulsive Overall Cognitive Status: Impaired/Different from baseline Area of Impairment: Safety/judgement                         Safety/Judgement: Decreased awareness of safety;Decreased awareness of deficits     General Comments: Pt A&OX4, pleasant and eager to return to independence. A bit impulsive at times with quick movements, requiring cues for safety and pacing self to maintain spinal precautions and decrease fall risk      General Comments General comments (skin integrity, edema, etc.): Educated on back precautions during ADLs, provided handout. Pt verbalized understanding of education. Encouraged pt to have wife present for tub transfers for safety    Exercises     Assessment/Plan    PT Assessment Patient needs continued PT services  PT Problem List Decreased strength;Decreased balance;Decreased activity tolerance;Decreased mobility;Decreased coordination;Decreased safety awareness       PT Treatment Interventions DME instruction;Gait training;Stair training;Functional mobility training;Therapeutic activities;Therapeutic  exercise;Balance training;Patient/family education    PT Goals (Current goals can be found in the Care  Plan section)  Acute Rehab PT Goals Patient Stated Goal: go home PT Goal Formulation: With patient Time For Goal Achievement: 02/16/20 Potential to Achieve Goals: Good    Frequency Min 5X/week    AM-PAC PT "6 Clicks" Mobility  Outcome Measure Help needed turning from your back to your side while in a flat bed without using bedrails?: A Little Help needed moving from lying on your back to sitting on the side of a flat bed without using bedrails?: A Little Help needed moving to and from a bed to a chair (including a wheelchair)?: A Little Help needed standing up from a chair using your arms (e.g., wheelchair or bedside chair)?: A Little Help needed to walk in hospital room?: A Little Help needed climbing 3-5 steps with a railing? : A Little 6 Click Score: 18    End of Session Equipment Utilized During Treatment: Gait belt Activity Tolerance: Patient tolerated treatment well Patient left: in bed;with call bell/phone within reach;with bed alarm set (chair position) Nurse Communication: Mobility status PT Visit Diagnosis: Unsteadiness on feet (R26.81);Other abnormalities of gait and mobility (R26.89);History of falling (Z91.81)    Time: 3664-4034 PT Time Calculation (min) (ACUTE ONLY): 31 min   Charges:   PT Evaluation $PT Eval Moderate Complexity: 1 Mod PT Treatments $Gait Training: 8-22 mins        Karma Ganja, PT, DPT   Acute Rehabilitation Department Pager #: [phone]  Otho Bellows 02/02/2020, 9:37 AM

## 2020-02-02 NOTE — Progress Notes (Signed)
Patient and patients wife given discharge education and all questions answered. No equipment to deliver and no printed prescriptions to give. IV removed. Patient taken to car with all belongings.

## 2020-02-02 NOTE — TOC Transition Note (Signed)
Transition of Care (TOC) - CM/SW Discharge Note Marvetta Gibbons RN,BSN Transitions of Care Unit 4NP (non trauma) - RN Case Manager See Treatment Team for direct Phone #   Patient Details  Name: Kenneth Pace MRN: [id number] Date of Birth: [birth year]  Transition of Care [facility]) CM/SW Contact:  Dawayne Patricia, RN Phone Number: 02/02/2020, 11:39 AM   Clinical Narrative:    Pt stable for transition home, verbal order received for referral to outpt PT- CM spoke with pt at bedside to discuss location choice for outpt PT- per pt he has been doing outpt PT at [facility] location in [place] and would like to continue there. Will send epic referral to [facility] outtpt rehab. Pt to return home, has DME needed, spouse to transport home.   Outpt PT referral sent via epic to [facility] location.    Final next level of care: OP Rehab Barriers to Discharge: No Barriers Identified   Patient Goals and CMS Choice Patient states their goals for this hospitalization and ongoing recovery are:: return home   Choice offered to / list presented to : Patient  Discharge Placement                 Home      Discharge Plan and Services   Discharge Planning Services: CM Consult Post Acute Care Choice: NA                    HH Arranged: NA HH Agency: NA        Social Determinants of Health (SDOH) Interventions     Readmission Risk Interventions Readmission Risk Prevention Plan 02/02/2020  Post Dischage Appt Complete  Medication Screening Complete  Transportation Screening Complete  Some recent data might be hidden

## 2020-02-02 NOTE — Evaluation (Addendum)
Occupational Therapy Evaluation/Discharge Patient Details Name: Kenneth Pace MRN: [id number] DOB: Apr 21, 1950 Today's Date: 02/02/2020    History of Present Illness Kenneth Pace is a 69 y.o. male who previously underwent right L4-5 laminotomy and microdiscectomy approximately 9 months ago for a large disc herniation who was doing well up until several weeks ago when he developed severe recurrent lower back and right leg pain associated with right foot weakness. Am MRI L spine revealed large recurrent disc herniation at L4-5. Pt underwent repeat microdiscectomy at L4-5 on 12/17.   Clinical Impression   PTA, pt lives with wife, works full time, and reports Modified Independence with ADLs, IADLs and mobility. Pt typically uses cane but had recently been using wheelchair due to pain/weakness. Pt endorses frequent falls at home. Pt presents now with pain controlled, deficits in balance and safety awareness. Pt overall min guard during mobility for balance with cues for safety and pacing. Educated pt on ADLs with back precautions with pt able to demonstrate ADLs at Supervision level. Pt reports his wife will be home 24/7. Recommended pt have wife present for tub transfer attempts to decrease fall risk. Pt would benefit from 24/7 supervision initially at home due to cues needed for spinal precautions, safety and pacing as this increases pt fall risk. No further skilled OT services indicated at acute level or on discharge. OT to sign off.     Follow Up Recommendations  No OT follow up;Supervision/Assistance - 24 hour (24/7 initially to ensure safety during activities)    Equipment Recommendations  None recommended by OT (has all needed DME)    Recommendations for Other Services       Precautions / Restrictions Precautions Precautions: Fall;Back Precaution Booklet Issued: Yes (comment) Precaution Comments: no brace needed Restrictions Weight Bearing Restrictions: No      Mobility  Bed Mobility Overal bed mobility: Needs Assistance Bed Mobility: Rolling;Sidelying to Sit;Sit to Sidelying Rolling: Modified independent (Device/Increase time) Sidelying to sit: Supervision     Sit to sidelying: Supervision General bed mobility comments: Supervision with cueing for log rolling, good carryover initially but needed cues when returning back to bed due to quick movements and attempting to twist spine    Transfers Overall transfer level: Needs assistance Equipment used: Straight cane Transfers: Sit to/from Stand;Stand Pivot Transfers Sit to Stand: Min guard Stand pivot transfers: Min guard       General transfer comment: min guard for safety in standing due to reported R LE weakness, a bit impulsive, lifitng cane over IV line. Cues to slow pace and safe sequencing    Balance Overall balance assessment: Needs assistance;History of Falls Sitting-balance support: No upper extremity supported;Feet supported Sitting balance-Leahy Scale: Good     Standing balance support: Single extremity supported;During functional activity Standing balance-Leahy Scale: Fair Standing balance comment: fair static standing, benefits from UE support for dynamic tasks                           ADL either performed or assessed with clinical judgement   ADL Overall ADL's : Needs assistance/impaired Eating/Feeding: Independent;Sitting   Grooming: Supervision/safety;Standing   Upper Body Bathing: Supervision/ safety;Sitting   Lower Body Bathing: Supervison/ safety;Sit to/from stand;Sitting/lateral leans Lower Body Bathing Details (indicate cue type and reason): Educated to perform task seated for safety Upper Body Dressing : Set up;Sitting   Lower Body Dressing: Supervision/safety;Sitting/lateral leans;Sit to/from stand Lower Body Dressing Details (indicate cue type and reason): Able to bring  feet to self in figure four position sitting EOB. Educated to complete tasks  seated Toilet Transfer: Min guard;Ambulation;Regular Toilet (cane) Toilet Transfer Details (indicate cue type and reason): Able to demo ability to transfers on/off toilet keeping spine straight Toileting- Clothing Manipulation and Hygiene: Supervision/safety;Sit to/from stand       Functional mobility during ADLs: Min guard;Cane;Cueing for safety;Cueing for sequencing General ADL Comments: Pt overall does not require physical assist for ADLs, but benefits from cues for safety, pacing and spinal precautions     Vision Baseline Vision/History: Wears glasses Wears Glasses: At all times Patient Visual Report: No change from baseline Vision Assessment?: No apparent visual deficits     Perception     Praxis      Pertinent Vitals/Pain Pain Assessment: 0-10 Pain Score: 2  Pain Location: surgical site, low back Pain Descriptors / Indicators: Grimacing Pain Intervention(s): Monitored during session;Repositioned     Hand Dominance Right   Extremity/Trunk Assessment Upper Extremity Assessment Upper Extremity Assessment: Overall WFL for tasks assessed   Lower Extremity Assessment Lower Extremity Assessment: Defer to PT evaluation   Cervical / Trunk Assessment Cervical / Trunk Assessment: Normal   Communication Communication Communication: No difficulties   Cognition Arousal/Alertness: Awake/alert Behavior During Therapy: WFL for tasks assessed/performed;Impulsive Overall Cognitive Status: Impaired/Different from baseline Area of Impairment: Safety/judgement                         Safety/Judgement: Decreased awareness of safety;Decreased awareness of deficits     General Comments: Pt A&OX4, pleasant and eager to return to independence. A bit impulsive at times with quick movements, requiring cues for safety and pacing self to maintain spinal precautions and decrease fall risk   General Comments  Educated on back precautions during ADLs, provided handout. Pt  verbalized understanding of education. Encouraged pt to have wife present for tub transfers for safety    Exercises     Shoulder Instructions      Home Living Family/patient expects to be discharged to:: Private residence Living Arrangements: Spouse/significant other Available Help at Discharge: Family;Available 24 hours/day Type of Home: House Home Access: Stairs to enter [employer] of Steps: 1 with rails in garage, 3 with rails at front Entrance Stairs-Rails: Left;Right;Can reach both Home Layout: Two level Alternate Level Stairs-Number of Steps: 17 (flight) Alternate Level Stairs-Rails: Can reach both Bathroom Shower/Tub: Tub/shower unit;Curtain   Biochemist, clinical: Standard     Home Equipment: Environmental consultant - 2 wheels;Cane - single point;Shower seat;Grab bars - toilet;Grab bars - tub/shower;Hand held shower head;Adaptive equipment;Walker - 4 wheels;Toilet riser;Wheelchair - manual          Prior Functioning/Environment Level of Independence: Independent with assistive device(s)        Comments: using WC most reccently, but able to compelte transfers independently, has used cane in past. pt reports 1-2 falls per month. Pt works full time with [employer] (retiring soon). Reports independence with ADLs, IADLs        OT Problem List:        OT Treatment/Interventions:      OT Goals(Current goals can be found in the care plan section) Acute Rehab OT Goals Patient Stated Goal: go home OT Goal Formulation: All assessment and education complete, DC therapy  OT Frequency:     Barriers to D/C:            Co-evaluation              AM-PAC OT "6 Clicks"  Daily Activity     Outcome Measure Help from another person eating meals?: None Help from another person taking care of personal grooming?: A Little Help from another person toileting, which includes using toliet, bedpan, or urinal?: A Little Help from another person bathing (including washing, rinsing, drying)?:  A Little Help from another person to put on and taking off regular upper body clothing?: A Little Help from another person to put on and taking off regular lower body clothing?: A Little 6 Click Score: 19   End of Session Equipment Utilized During Treatment: Gait belt;Other (comment) (cane) Nurse Communication: Mobility status;Other (comment) (discharge recs)  Activity Tolerance: Patient tolerated treatment well Patient left: in bed;with call bell/phone within reach;with bed alarm set;with nursing/sitter in room  OT Visit Diagnosis: Unsteadiness on feet (R26.81);Other abnormalities of gait and mobility (R26.89)                Time: 2712-9290 OT Time Calculation (min): 17 min Charges:  OT General Charges $OT Visit: 1 Visit OT Evaluation $OT Eval Low Complexity: 1 Low  Layla Maw, OTR/L  Layla Maw 02/02/2020, 9:31 AM

## 2020-02-06 ENCOUNTER — Other Ambulatory Visit: Payer: Self-pay

## 2020-02-06 ENCOUNTER — Encounter
Payer: BC Managed Care – PPO | Attending: Physical Medicine & Rehabilitation | Admitting: Physical Medicine & Rehabilitation

## 2020-02-06 ENCOUNTER — Encounter: Payer: Self-pay | Admitting: Physical Medicine & Rehabilitation

## 2020-02-06 VITALS — BP 167/80 | HR 84 | Temp 98.7°F | Ht 72.0 in | Wt 235.0 lb

## 2020-02-06 DIAGNOSIS — R269 Unspecified abnormalities of gait and mobility: Secondary | ICD-10-CM

## 2020-02-06 DIAGNOSIS — G8918 Other acute postprocedural pain: Secondary | ICD-10-CM

## 2020-02-06 DIAGNOSIS — M792 Neuralgia and neuritis, unspecified: Secondary | ICD-10-CM | POA: Diagnosis not present

## 2020-02-06 DIAGNOSIS — M5416 Radiculopathy, lumbar region: Secondary | ICD-10-CM | POA: Diagnosis not present

## 2020-02-06 DIAGNOSIS — G6181 Chronic inflammatory demyelinating polyneuritis: Secondary | ICD-10-CM | POA: Diagnosis not present

## 2020-02-06 DIAGNOSIS — R261 Paralytic gait: Secondary | ICD-10-CM

## 2020-02-06 NOTE — Progress Notes (Addendum)
Subjective:    Patient ID: Kenneth Pace, male    DOB: Oct 20, 1950, 69 y.o.   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   Last clinic visit on 11/07/19.  Wife supplements history. Since that time, pt went on a cruise and herniated  L4 disc 1/2 way through.  He came back, saw Neurosurg, had MRI, and surgery for laminotomy and decompression. He is awaiting therapies. He continues to see Neuro.  He retires next week. He continues to take meds. Has had several falls, states because of his dog. He is on Robaxin. He is on Percocet at present.  Review of Systems  Constitutional: Negative.   HENT: Negative.   Eyes: Negative.   Respiratory: Negative.   Cardiovascular: Positive for leg swelling.  Gastrointestinal: Negative.   Endocrine: Negative.   Genitourinary: Negative.   Musculoskeletal: Positive for back pain and gait problem.  Skin: Negative.   Allergic/Immunologic: Negative.   Neurological: Positive for weakness and numbness.        Balance   Hematological: Negative.   Psychiatric/Behavioral: Negative.   All other systems reviewed and are negative.     Objective:   Physical Exam  Constitutional: No distress . Vital signs reviewed. HENT: Normocephalic.  Atraumatic. Eyes: EOMI. No discharge. Cardiovascular: No JVD.   Respiratory: Normal effort.  No stridor.   GI: Non-distended.   Skin: Warm and dry.  Intact. Psych: Normal mood.  Normal behavior. Very positive. Musc: TTP along surgical incision Gait: Spastic and antalgic and wide based Neurological: Alert Motor: B/L UE 5/5 proximal to distal  LLE: HF 5/5, KE, ADF/PF 5/5, unchanged RLE: HF 4-/5, KE, ADF/PF 4+/5 Mas: no appeared increase in tone in b/l LE    Assessment & Plan:  Male with history of T2DM, OSA,,  subacute inflammatory  polyradiculoneuropathy treated with IVIG for lower extremity weakness 11/2016 presents for follow up for cervical myelopathy and CIDP and back pain.   1.  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  Initiate therapies  Continue follow up Neurology -IVIG per Neurology  Continue work - planning on retiring next week   2.  Pain Management:   Gabapentin and Lyrica d/ced due to edema  No benefit with Mobic, Naproxen  Cont Lidoderm patch  Continue TENS prn  Continue Elavil to 75 qhs   Continue Cymbalta to 60mg  with food  Continue Baclofen per Neurology  Cont Tylenol  Continue Celebrex 100 qhs  3. Gait abnormality  Continue HEP  Using cane more due to recent surgery  See #1  4. Mechanical low back - multifactorial  Myalgia +/- facet arthropathy + disc herniation  MRI L-spine reviewed, multilevel predominantly foraminal and facet arthropathy  Continue Balcofen per Neurology  Cont Cymbalta   Note written for gym participation - discussed safety, but unable to go because it is still closed until 2022   5. Myalgia  Good benefit with TP injections, again on 3/9  See #1, #4  6. Spasticity  See #1,2,3  Continue Baclofen  Cont HEP  Will consider further intervention after therapies and attendance in gym  7. Essential HTN  Follow up with PCP  8. Lumbar radiculopathy with repeat surgery due to herniated disc  S/p decompression right L4  S/p laminotomy and right L4-5  microdisc  Pain Inventory Average Pain 6 Pain Right Now 4 My pain is sharp  In the last 24 hours, has pain interfered with the following? General activity 3 Relation with others 1 Enjoyment of life 1 What TIME of day is your pain at its worst? morning, evening Sleep?   Good Pain is worse with: bending and inactivity Pain improves with: rest and medication Relief from Meds: 4  Mobility walk without assistance walk  with assistance use a cane ability to climb steps?  yes do you drive?  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  yes  Physicians involved in your care Any changes since last visit?  no   Family History  Problem Relation Age of Onset  . Dementia Mother   . Diabetes Mellitus I Mother   . Hypertension Father        98  . Healthy Sister   . Rheum arthritis Brother   . Cancer Neg Hx   . Colon cancer Neg Hx   . Esophageal cancer Neg Hx   . Stomach cancer Neg Hx   . Rectal cancer Neg Hx   . Colon polyps Neg Hx    Social History   Socioeconomic History  . Marital status: Married    Spouse name: Not on file  . Number of children: Not on file  . Years of education: Not on file  . Highest education level: Not on file  Occupational History  . Occupation: Lobbyist: [employer]    Comment: [employer]  Tobacco Use  . Smoking status: Never Smoker  . Smokeless tobacco: Never Used  Vaping Use  . Vaping Use: Never used  Substance and Sexual Activity  . Alcohol use: No  . Drug use: No  . Sexual activity: Not on file  Other Topics Concern  . Not on file  Social History Narrative   He works for [employer]   He lives at home with wife.     Highest level of education:  BS, business admin   Right handed   Two story home   Social Determinants of Health   Financial Resource Strain: Not on file  Food Insecurity: Not on file  Transportation Needs: Not on file  Physical Activity: Not on file  Stress: Not on file  Social Connections: Not on file   Past Surgical History:  Procedure Laterality Date  . ANTERIOR CERVICAL DECOMP/DISCECTOMY FUSION N/A 02/01/2017   Procedure: ANTERIOR CERVICAL DECOMPRESSION/DISCECTOMY FUSION CERVICAL THREE-FOUR , CERVICAL FOUR-FIVE  CERVICAL FIVE-SIX;  Surgeon:  Consuella Lose, MD;  Location: [facility];  Service: Neurosurgery;  Laterality: N/A;  . APPENDECTOMY    . COLONOSCOPY  02/05/2010   [facility]  . KNEE SURGERY     2 Lknee arthroscopy, 3 R knee arthroscpoy  . KNEE SURGERY Right 11/12/2016  . LUMBAR LAMINECTOMY/DECOMPRESSION MICRODISCECTOMY Right 05/22/2019   Procedure: LAMINOTOMY AND MICRODISCECTOMY RIGHT LUMBAR FOUR- LUMBAR FIVE;  Surgeon: Consuella Lose, MD;  Location: [facility];  Service: Neurosurgery;  Laterality: Right;  LAMINOTOMY AND MICRODISCECTOMY RIGHT LUMBAR FOUR- LUMBAR FIVE  . LUMBAR LAMINECTOMY/DECOMPRESSION MICRODISCECTOMY Right 02/01/2020   Procedure: REDO MICRODISCECTOMY RT LUMBAR FOUR-FIVE;  Surgeon: Consuella Lose, MD;  Location: [facility];  Service: Neurosurgery;  Laterality: Right;  posterior  . TONSILLECTOMY     Past Medical History:  Diagnosis Date  .  ADENOIDECTOMY, HX OF 02/16/2007  . Anxiety    pt. states he does not have anxiety.  Marland Kitchen BENIGN PROSTATIC HYPERTROPHY, WITH OBSTRUCTION 02/03/2010  . 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 DIABETES MELLITUS, TYPE I, UNCONTROLLED 12/29/2006  . ERECTILE DYSFUNCTION 02/16/2007  . Hearing loss    wears hearing aids  . HYPERLIPIDEMIA 02/16/2007  . HYPERTENSION 02/16/2007  . Nerve pain    per patient "on lower back and both legs"  . Sleep apnea    uses CPAP  . TESTICULAR MASS, LEFT 02/16/2007   BP (!) 167/80   Pulse 84   Temp 98.7 F (37.1 C)   Ht 6' (1.829 m)   Wt 235 lb (106.6 kg)   SpO2 98%   BMI 31.87 kg/m   Opioid Risk Score:   Fall Risk Score:  `1  Depression screen PHQ 2/9  Depression screen [facility] 2/9 12/10/2017 07/07/2017 02/26/2017 02/26/2017  Decreased Interest 0 0 0 0  Down, Depressed, Hopeless 0 0 0 0  PHQ - 2 Score 0 0 0 0  Altered sleeping - - 1 -  Tired, decreased energy - - 0 -  Change in appetite - - 0 -  Feeling bad or failure about yourself  - - 0 -  Trouble concentrating - - 0 -  Moving slowly or  fidgety/restless - - 1 -  Suicidal thoughts - - 0 -  PHQ-9 Score - - 2 -  Difficult doing work/chores - - Not difficult at all -  Some recent data might be hidden

## 2020-02-06 NOTE — Op Note (Signed)
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 Redo Right L4-5 laminotomy and microdiscectomy for decompression of nerve root 2. Use of operating microscope  SURGEON: Dr. Consuella Lose, MD  ASSISTANT: Ferne Reus, PA-C  ANESTHESIA: General Endotracheal  EBL: 50cc  SPECIMENS: None  DRAINS: None  COMPLICATIONS: None immediate  CONDITION: Hemodynamically stable to PACU  HISTORY: Kenneth Pace is a 69 y.o. male well-known to me from previous anterior cervical discectomy for cervical myelopathy as well as previous right-sided L4-5 laminotomy and microdiscectomy earlier this year for herniated disc.  Patient did very well for several months after his microdiscectomy however over the last few weeks he has noted recurrence of severe right-sided back and leg pain, as well as mild right dorsiflexion weakness.  His MRI demonstrated large recurrent superiorly migrated herniated disc at L4-5.  Given his clinical and radiographic findings, surgical decompression was indicated.  We did discuss options including fusion versus repeat microdiscectomy.  The risks of each procedure were reviewed in detail.  After lengthy discussion of options, risks, benefits, and alternatives, the patient and his wife elected to proceed with redo microdiscectomy.  All their questions were answered and consent was obtained.  PROCEDURE IN DETAIL: After informed consent was obtained and witnessed, the patient was brought to the operating room. After induction of general anesthesia, the patient was positioned on the operative table in the prone position with all pressure points meticulously padded. The skin of the low back was then prepped and draped in the usual sterile fashion.  After timeout was conducted, the previous skin incision was infiltrated with local anesthetic. Skin incision was then made sharply and Bovie  electrocautery was used to dissect the subcutaneous tissue until the lumbodorsal fascia was identified. The fascia was then incised using Bovie electrocautery and the lamina at the L4 and L5 levels was identified and dissection was carried out in the subperiosteal plane. Self-retaining retractor was then placed, and intraoperative x-ray was taken to confirm we were at the correct level.  There was a significant amount of fibrosis which was dissected with the Bovie.  The edges of the previous laminotomy defect were identified, and further superior, inferior, and lateral dissection was made with the Bovie.  Using a high-speed drill, I extended the laminotomy superiorly and laterally to identify the normal dura.  Kerrison punches were then used to further extend the laminotomy.  I was then able to identify the lateral edge of the thecal sac.  Dissection was then carried deeper until the ventral epidural space was identified.  There was a fair bit of epidural fibrosis in this plane.  I was able to identify a relatively large herniated disc fragment superior to the disc space.  This was removed essentially in 1 piece with rongeurs.  Attention was then turned to the disc space itself, where I did complete a superficial discectomy.  Once this was done, I was able to freely pass a ball-tipped dissector within the ventral epidural space indicating good decompression.  I was also able to palpate the right L4 pedicle and slide the dissector out the L4-5 foramen indicating good decompression of the nerve root.  Hemostasis was then secured using a combination of bipolar electrocautery and morselized Gelfoam with thrombin.  The thecal sac and nerve root was covered with Depo-Medrol.  Self-retaining retractor was then removed and hemostasis secured on the muscle edges with bipolar. The wound was closed in layers using  a combination of interrupted 0 Vicryl and 3-0 Vicryl stitches. The skin was closed using standard skin  glue.  At the end of the case all sponge, needle, and instrument counts were correct. The patient was then transferred to the stretcher and taken to the postanesthesia care unit in stable hemodynamic condition.   Consuella Lose, MD [facility]

## 2020-02-13 ENCOUNTER — Ambulatory Visit (HOSPITAL_COMMUNITY)
Admission: RE | Admit: 2020-02-13 | Discharge: 2020-02-13 | Disposition: A | Discharge status: Home / Self Care | Payer: BC Managed Care – PPO | Source: Ambulatory Visit | Attending: Neurology | Admitting: Neurology

## 2020-02-13 ENCOUNTER — Other Ambulatory Visit: Payer: Self-pay

## 2020-02-13 DIAGNOSIS — G6181 Chronic inflammatory demyelinating polyneuritis: Secondary | ICD-10-CM | POA: Insufficient documentation

## 2020-02-13 MED ORDER — IMMUNE GLOBULIN (HUMAN) 10 GM/100ML IV SOLN
1.0000 g/kg | INTRAVENOUS | Status: DC
  Administered 2020-02-13: 105 g via INTRAVENOUS
  Filled 2020-02-13: qty 800

## 2020-02-19 ENCOUNTER — Ambulatory Visit (INDEPENDENT_AMBULATORY_CARE_PROVIDER_SITE_OTHER): Payer: Medicare Other | Admitting: Rehabilitative and Restorative Service Providers"

## 2020-02-19 ENCOUNTER — Other Ambulatory Visit: Payer: Self-pay

## 2020-02-19 DIAGNOSIS — M6281 Muscle weakness (generalized): Secondary | ICD-10-CM | POA: Diagnosis present

## 2020-02-19 DIAGNOSIS — R2689 Other abnormalities of gait and mobility: Secondary | ICD-10-CM | POA: Diagnosis not present

## 2020-02-19 DIAGNOSIS — M5440 Lumbago with sciatica, unspecified side: Secondary | ICD-10-CM

## 2020-02-19 NOTE — Therapy (Signed)
[facility] [place], [place], [address] Phone: [phone]   Fax:  [phone]  Physical Therapy Evaluation  Patient Details  Name: Kenneth Pace MRN: [id number] Date of Birth: 1950-03-24 Referring Provider (PT): Consuella Lose, MD   Encounter Date: 02/19/2020   PT End of Session - 02/19/20 1328    Visit Number 1    Number of Visits 12    Date for PT Re-Evaluation 04/01/20    PT Start Time 0850    PT Stop Time 0928    PT Time Calculation (min) 38 min    Activity Tolerance Patient tolerated treatment well    Behavior During Therapy [facility] for tasks assessed/performed           Past Medical History:  Diagnosis Date  . ADENOIDECTOMY, HX OF 02/16/2007  . Anxiety    pt. states he does not have anxiety.  Marland Kitchen BENIGN PROSTATIC HYPERTROPHY, WITH OBSTRUCTION 02/03/2010  . 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 DIABETES MELLITUS, TYPE I, UNCONTROLLED 12/29/2006  . ERECTILE DYSFUNCTION 02/16/2007  . Hearing loss    wears hearing aids  . HYPERLIPIDEMIA 02/16/2007  . HYPERTENSION 02/16/2007  . Nerve pain    per patient "on lower back and both legs"  . Sleep apnea    uses CPAP  . TESTICULAR MASS, LEFT 02/16/2007    Past Surgical History:  Procedure Laterality Date  . ANTERIOR CERVICAL DECOMP/DISCECTOMY FUSION N/A 02/01/2017   Procedure: ANTERIOR CERVICAL DECOMPRESSION/DISCECTOMY FUSION CERVICAL THREE-FOUR , CERVICAL FOUR-FIVE  CERVICAL FIVE-SIX;  Surgeon: Consuella Lose, MD;  Location: [place];  Service: Neurosurgery;  Laterality: N/A;  . APPENDECTOMY    . COLONOSCOPY  02/05/2010   [facility]  . KNEE SURGERY     2 Lknee arthroscopy, 3 R knee arthroscpoy  . KNEE SURGERY Right 11/12/2016  . LUMBAR LAMINECTOMY/DECOMPRESSION MICRODISCECTOMY Right 05/22/2019   Procedure: LAMINOTOMY AND MICRODISCECTOMY RIGHT LUMBAR FOUR- LUMBAR FIVE;  Surgeon: Consuella Lose,  MD;  Location: [place];  Service: Neurosurgery;  Laterality: Right;  LAMINOTOMY AND MICRODISCECTOMY RIGHT LUMBAR FOUR- LUMBAR FIVE  . LUMBAR LAMINECTOMY/DECOMPRESSION MICRODISCECTOMY Right 02/01/2020   Procedure: REDO MICRODISCECTOMY RT LUMBAR FOUR-FIVE;  Surgeon: Consuella Lose, MD;  Location: [place];  Service: Neurosurgery;  Laterality: Right;  posterior  . TONSILLECTOMY      There were no vitals filed for this visit.    Subjective Assessment - 02/19/20 0852    Subjective The patient is known to our clinic from prior PT for CIDP and back pain.  He had increased low back pain and underwent microdiscectomy on 02/02/20 for L4-L45.    Pertinent History laminectomy april 2021, 2018 cervical decompression    Patient Stated Goals improve strength, be able to get up and down better, community exercise    Currently in Pain? Yes    Pain Score 2     Pain Location Back    Pain Orientation Lower    Pain Descriptors / Indicators Aching;Sore    Pain Type Surgical pain    Pain Radiating Towards n/a-- radiating pain resolved with surgery    Pain Onset 1 to 4 weeks ago    Pain Frequency Intermittent    Aggravating Factors  transitions    Pain Relieving Factors rest              [facility] PT Assessment - 02/19/20 0900      Assessment   Medical Diagnosis L4-L5 discectomy    Referring Provider (  PT) Lisbeth Renshaw, MD    Onset Date/Surgical Date 02/02/20    Hand Dominance Right    Prior Therapy known to our clinic from prior PT      Precautions   Precautions Back    Precaution Comments avoid liftin g>10 pounts, no bending, twisting.      Restrictions   Weight Bearing Restrictions No      Balance Screen   Has the patient fallen in the past 6 months Yes    How many times? 3-4    Has the patient had a decrease in activity level because of a fear of falling?  No    Is the patient reluctant to leave their home because of a fear of falling?  No      Home Environment   Living Environment  Private residence    Living Arrangements Spouse/significant other    Type of Home House    Home Access Stairs to enter    Home Layout Two level;Bed/bath upstairs    Alternate Level Stairs-Number of Steps 17    Alternate Level Stairs-Rails Can reach both;Right;Left    Home Equipment Cane - single point    Additional Comments No cane use indoors, uses walls/furniture for balance.       Prior Function   Level of Independence Independent      Observation/Other Assessments   Focus on Therapeutic Outcomes (FOTO)  n/a-- due to chronic medical deficits      Observation/Other Assessments-Edema    Edema --   gets edema in bilat LE below knees     Sensation   Light Touch Impaired Detail    Light Touch Impaired Details Impaired RLE    Additional Comments R LE has "1/2 feeling" below knee.  Felt some prior to surgery and this continues.      ROM / Strength   AROM / PROM / Strength AROM;Strength      AROM   Overall AROM  Within functional limits for tasks performed   had prior tightness     Strength   Overall Strength Deficits    Strength Assessment Site Hip;Knee;Ankle    Right/Left Hip Right;Left    Right Hip Flexion 3+/5    Right Hip ABduction 3+/5    Left Hip Flexion 4/5    Left Hip ABduction 5/5    Right/Left Knee Right;Left    Right Knee Flexion 5/5    Right Knee Extension 5/5    Left Knee Flexion 5/5    Left Knee Extension 5/5    Right/Left Ankle Right;Left    Right Ankle Dorsiflexion 4/5    Left Ankle Dorsiflexion 5/5      Flexibility   Soft Tissue Assessment /Muscle Length yes    Hamstrings 45 degrees from extended in 90/90 starting position supine   gastrocs tightness per observation with gait     Ambulation/Gait   Ambulation/Gait Yes    Ambulation/Gait Assistance 6: Modified independent (Device/Increase time)    Ambulation Distance (Feet) 100 Feet    Assistive device Straight cane    Gait Pattern Narrow base of support   dec trunk rotation, longer/exaggerated  stride   Ambulation Surface Level;Indoor    Gait velocity 2.67  ft/sec    Gait Comments patient doing steps in home mod indep with handrails.  He notes difficulty on compliant surfaces.      Standardized Balance Assessment   Standardized Balance Assessment Berg Balance Test      Berg Balance Test   Sit to  Stand Able to stand without using hands and stabilize independently    Standing Unsupported Able to stand safely 2 minutes    Sitting with Back Unsupported but Feet Supported on Floor or Stool Able to sit safely and securely 2 minutes    Stand to Sit Sits safely with minimal use of hands    Transfers Able to transfer safely, minor use of hands    Standing Unsupported with Eyes Closed Able to stand 10 seconds safely    Standing Unsupported with Feet Together Able to place feet together independently and stand 1 minute safely    Standing Unsupported, One Foot in Front Able to take small step independently and hold 30 seconds    Standing on One Leg Able to lift leg independently and hold equal to or more than 3 seconds    Berg comment: *components of Berg performed-- avoided bending to pick up object and turning ot look behind him due to back precautions                      Objective measurements completed on examination: See above findings.       [facility] Adult PT Treatment/Exercise - 02/19/20 0900      Exercises   Exercises Lumbar      Lumbar Exercises: Stretches   Active Hamstring Stretch Right;Left;2 reps;30 seconds    Gastroc Stretch Right;Left;1 rep;30 seconds      Lumbar Exercises: Supine   Clam 10 reps    Bent Knee Raise 10 reps    Bridge 5 reps    Bridge Limitations provokes pain-- moved to supine marching      Lumbar Exercises: Sidelying   Clam Right;Left;10 reps                  PT Education - 02/19/20 1328    Education Details HEP    Person(s) Educated Patient    Methods Explanation;Demonstration;Handout    Comprehension Verbalized  understanding;Returned demonstration               PT Long Term Goals - 02/19/20 1332      PT LONG TERM GOAL #1   Title The patient will be independent with HEP.    Time 6    Period Weeks    Target Date 04/01/20      PT LONG TERM GOAL #2   Title The patient will improve R LE strength to 4+/5 hip flexion and abduction.    Time 6    Period Weeks    Target Date 04/01/20      PT LONG TERM GOAL #3   Title The patient will report resolution of low back pain to 0/10.    Time 6    Period Weeks    Target Date 04/01/20      PT LONG TERM GOAL #4   Title The patient will improve HS length to -30 from full extension (supine 90/90 with contralateral LE extended).    Time 6    Period Weeks    Target Date 04/01/20      PT LONG TERM GOAL #5   Title The patient will imrpove gait speed from 2.67 ft/sec to > or equal to 3.0 ft/sec.    Time 6    Period Weeks    Target Date 04/01/20                  Plan - 02/19/20 1334    Clinical Impression Statement The patient is  a 70 yo male presenting to OP physical therapy s/p L4-L5 microdiscectomy on 02/02/20.  He presents with impairments in flexibility in hips, LEs, weakness in R LE for hip flexion/abduction/ankle DF, decreased balance, mild pain, and dec'd ROM.  The patient has chronic mobility issues due to h/o cervical spine decompression surgery and CIDP.  PT to address deficits to optimize functional status.    Personal Factors and Comorbidities Comorbidity 3+    Comorbidities cervical decompression, CIDP, h/o frequent falls, prior back surgery 05/2019    Examination-Activity Limitations Lift;Locomotion Level;Stairs;Stand;Sit    Examination-Participation Restrictions Community Activity    Stability/Clinical Decision Making Evolving/Moderate complexity    Clinical Decision Making Moderate    Rehab Potential Good    PT Frequency 2x / week    PT Duration 6 weeks    PT Treatment/Interventions ADLs/Self Care Home Management;Gait  training;Stair training;Functional mobility training;Therapeutic activities;Therapeutic exercise;Balance training;Patient/family education;Manual techniques;Taping    PT Next Visit Plan Progress HEP to tolerance focusing on R hip and ankle strengthening, core stabilization, gait training.    PT Home Exercise Plan [place] and Agree with Plan of Care Patient           Patient will benefit from skilled therapeutic intervention in order to improve the following deficits and impairments:  Pain,Impaired flexibility,Hypomobility,Postural dysfunction,Decreased strength,Decreased range of motion,Difficulty walking,Decreased activity tolerance,Decreased balance  Visit Diagnosis: Muscle weakness (generalized)  Other abnormalities of gait and mobility  Acute low back pain with sciatica, sciatica laterality unspecified, unspecified back pain laterality     Problem List Patient Active Problem List   Diagnosis Date Noted  . Lumbar herniated disc 02/01/2020  . Lumbar radiculopathy 05/22/2019  . Spastic gait 03/07/2019  . Myalgia 11/01/2018  . Chronic bilateral low back pain without sciatica 07/21/2018  . 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 CIDP (chronic inflammatory demyelinating polyneuropathy) ([place]) 12/30/2017  . Edema 03/01/2017  . Hypokalemia   . Slow transit constipation   . Steroid-induced hyperglycemia   . Urinary retention   . Accidental drug overdose   . Postoperative pain   . Hyponatremia   . Cervical myelopathy ([place]) 02/01/2017  . Shortness of breath at rest   . Hypoglycemia   . Spondylosis, cervical, with myelopathy   . Neuropathic pain   . Benign essential HTN   . Labile blood glucose   . Muscle spasm   . GAD (generalized anxiety disorder)   . Acute inflammatory demyelinating polyneuropathy ([place]) 01/11/2017  . Anxiety state   . Neurogenic bladder   . Depression 12/30/2016  . Leg weakness, bilateral 12/30/2016  . 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 GBS (Guillain Barre syndrome) ([place])   . AIDP (acute inflammatory demyelinating polyneuropathy) ([place]) 11/27/2016  . Weakness 11/20/2016  . Weakness of both arms 10/30/2016  . 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 Obstructive sleep apnea 03/24/2016  . Numbness 03/18/2016  . Mild nonproliferative diabetic retinopathy of both eyes without macular edema associated with type 2 diabetes mellitus ([place]) 05/24/2015  . Nuclear cataract of both eyes 05/24/2015  . Diabetes mellitus without complication ([place]) [id number]  . 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 Eustachian tube dysfunction 02/12/2015  . Acute maxillary sinusitis 02/12/2015  . Wellness examination 08/22/2014  . Alkaline phosphatase elevation 08/22/2014  . 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 Benign non-nodular prostatic hyperplasia with lower urinary tract symptoms 01/31/2014  . Lesion of eyelid 09/26/2013  . Screening for prostate cancer 04/24/2013  . Diabetes (  [place]) 06/16/2012  . ED (erectile dysfunction) of organic origin 06/03/2012  . Routine general medical examination at a health care facility 04/10/2012  . BPH (benign prostatic hyperplasia) 02/03/2010  . HEMOCCULT POSITIVE STOOL 05/25/2008  . ELBOW PAIN, RIGHT 07/04/2007  . Dyslipidemia 02/16/2007  . ANXIETY 02/16/2007  . ERECTILE DYSFUNCTION 02/16/2007  . Essential hypertension 02/16/2007  . TESTICULAR MASS, LEFT 02/16/2007  . KNEE PAIN, CHRONIC 02/16/2007  . Michell Heinrich OF 02/16/2007    Jagdeep Ancheta, PT 02/19/2020, [place], [place], [address] Phone: [phone]   Fax:  [phone]  Name: Kenneth Pace MRN: [id number] Date of Birth: Jun 10, 1950

## 2020-02-19 NOTE — Patient Instructions (Signed)
Access Code: [facility] URL: [email or web address] Date: 02/19/2020 Prepared by: Margretta Ditty  Exercises Supine Hamstring Stretch with Strap - 2 x daily - 7 x weekly - 1 sets - 3 reps - 30 seconds hold Supine March - 2 x daily - 7 x weekly - 1 sets - 10 reps Clamshell with Resistance - 2 x daily - 7 x weekly - 1 sets - 10 reps - 3-5 seconds hold Gastroc Stretch on Wall - 2 x daily - 7 x weekly - 1 sets - 3 reps - 30 seconds hold

## 2020-02-22 ENCOUNTER — Encounter: Payer: Self-pay | Admitting: Rehabilitative and Restorative Service Providers"

## 2020-02-22 ENCOUNTER — Other Ambulatory Visit: Payer: Self-pay

## 2020-02-22 ENCOUNTER — Ambulatory Visit (INDEPENDENT_AMBULATORY_CARE_PROVIDER_SITE_OTHER): Payer: Medicare Other | Admitting: Rehabilitative and Restorative Service Providers"

## 2020-02-22 DIAGNOSIS — R2689 Other abnormalities of gait and mobility: Secondary | ICD-10-CM | POA: Diagnosis not present

## 2020-02-22 DIAGNOSIS — M5440 Lumbago with sciatica, unspecified side: Secondary | ICD-10-CM | POA: Diagnosis not present

## 2020-02-22 DIAGNOSIS — M6281 Muscle weakness (generalized): Secondary | ICD-10-CM | POA: Diagnosis present

## 2020-02-22 NOTE — Therapy (Signed)
[facility] [place] [address] [place], [place], [address] Phone: [phone]   Fax:  [phone]  Physical Therapy Treatment  Patient Details  Name: Kenneth Pace MRN: [id number] Date of Birth: 1951-02-01 Referring Provider (PT): Lisbeth Renshaw, MD   Encounter Date: 02/22/2020   PT End of Session - 02/22/20 1217    Visit Number 2    Number of Visits 12    Date for PT Re-Evaluation 04/01/20    PT Start Time 1152    PT Stop Time 1232    PT Time Calculation (min) 40 min    Activity Tolerance Patient tolerated treatment well    Behavior During Therapy [facility] for tasks assessed/performed           Past Medical History:  Diagnosis Date  . ADENOIDECTOMY, HX OF 02/16/2007  . Anxiety    pt. states he does not have anxiety.  Marland Kitchen BENIGN PROSTATIC HYPERTROPHY, WITH OBSTRUCTION 02/03/2010  . Chronic inflammatory demyelinating polyneuropathy ([facility])    diagnosed 11/2016  . DIABETES MELLITUS, TYPE I, UNCONTROLLED 12/29/2006  . ERECTILE DYSFUNCTION 02/16/2007  . Hearing loss    wears hearing aids  . HYPERLIPIDEMIA 02/16/2007  . HYPERTENSION 02/16/2007  . Nerve pain    per patient "on lower back and both legs"  . Sleep apnea    uses CPAP  . TESTICULAR MASS, LEFT 02/16/2007    Past Surgical History:  Procedure Laterality Date  . ANTERIOR CERVICAL DECOMP/DISCECTOMY FUSION N/A 02/01/2017   Procedure: ANTERIOR CERVICAL DECOMPRESSION/DISCECTOMY FUSION CERVICAL THREE-FOUR , CERVICAL FOUR-FIVE  CERVICAL FIVE-SIX;  Surgeon: Lisbeth Renshaw, MD;  Location[facility];  Service: Neurosurgery;  Laterality: N/A;  . APPENDECTOMY    . COLONOSCOPY  02/05/2010   [facility]  . KNEE SURGERY     2 Lknee arthroscopy, 3 R knee arthroscpoy  . KNEE SURGERY Right 11/12/2016  . LUMBAR LAMINECTOMY/DECOMPRESSION MICRODISCECTOMY Right 05/22/2019   Procedure: LAMINOTOMY AND MICRODISCECTOMY RIGHT LUMBAR FOUR- LUMBAR FIVE;  Surgeon: Lisbeth Renshaw,  MD;  Location: [facility];  Service: Neurosurgery;  Laterality: Right;  LAMINOTOMY AND MICRODISCECTOMY RIGHT LUMBAR FOUR- LUMBAR FIVE  . LUMBAR LAMINECTOMY/DECOMPRESSION MICRODISCECTOMY Right 02/01/2020   Procedure: REDO MICRODISCECTOMY RT LUMBAR FOUR-FIVE;  Surgeon: Lisbeth Renshaw, MD;  Location[facility] OR;  Service: Neurosurgery;  Laterality: Right;  posterior  . TONSILLECTOMY      There were no vitals filed for this visit.   Subjective Assessment - 02/22/20 1156    Subjective The patient reports he had increased pain after therapy and increasing activity around his home.  His pain is in the right proximal glut and low back.  The patient and PT reviewed ther ex (supine marches, clam shells, gastrocs, HS stretch).  He did ther ex many times/day and also cleaned a lot around the house.  He has worsening pain that is not responding to meds.    Pertinent History laminectomy april 2021, 2018 cervical decompression    Patient Stated Goals improve strength, be able to get up and down better, community exercise    Currently in Pain? Yes    Pain Score 2    in sitting   Pain Location Back    Pain Descriptors / Indicators Aching;Sore    Pain Type Surgical pain    Pain Onset 1 to 4 weeks ago    Pain Frequency Intermittent    Aggravating Factors  transitions    Pain Relieving Factors rest    Effect of Pain on Daily Activities when he moves from  sit>stand, he gets 6-7/10 pain level              [facility] PT Assessment - 02/22/20 1158      Assessment   Medical Diagnosis L4-L5 discectomy    Referring Provider (PT) Consuella Lose, MD    Onset Date/Surgical Date 02/02/20    Hand Dominance Right                         [facility] Adult PT Treatment/Exercise - 02/22/20 1158      Self-Care   Self-Care Other Self-Care Comments    Other Self-Care Comments  PT educated patient on avoiding increasing activity quickly discussing bone healing in 6-8 weeks.  He is 3 weeks (tomorrow) post  surgical.  We discussed dec'ing activities in the home to let his back rest.  We also discussed log rolling to get into/out of bed.  The patient also is sitting on his rollator.  He is having to flex to reach dishes.  PT recommended he take a break from washing dishes to allow pain to return to baseline (he returned to washing dishes this week)      Exercises   Exercises Lumbar;Other Exercises    Other Exercises  horizontal abduction x 10 reps      Lumbar Exercises: Stretches   Passive Hamstring Stretch Right;Left;1 rep;30 seconds    Passive Hamstring Stretch Limitations Patient demonstrates that if he brings his leg into more flexion, his low back hurts.  We discussed safely doing stretches at home.    Other Lumbar Stretch Exercise bent knee fallout x 5 reps R and L      Lumbar Exercises: Standing   Heel Raises 10 reps    Heel Raises Limitations tried toe raises x 3 reps, but painful, so stopped      Lumbar Exercises: Seated   Long Arc Quad on Chair Strengthening;Right;Left;5 reps      Lumbar Exercises: Supine   Ab Set 10 reps    AB Set Limitations with cues    Clam 5 reps      Modalities   Modalities Moist Heat      Moist Heat Therapy   Number Minutes Moist Heat 10 Minutes   at end of session did 10 min of heat (no charge)   Moist Heat Location Lumbar Spine                  PT Education - 02/22/20 1232    Education Details HEP modified  * see self care for further activities    Person(s) Educated Patient    Methods Explanation;Demonstration;Handout    Comprehension Verbalized understanding;Returned demonstration               PT Long Term Goals - 02/19/20 1332      PT LONG TERM GOAL #1   Title The patient will be independent with HEP.    Time 6    Period Weeks    Target Date 04/01/20      PT LONG TERM GOAL #2   Title The patient will improve R LE strength to 4+/5 hip flexion and abduction.    Time 6    Period Weeks    Target Date 04/01/20      PT  LONG TERM GOAL #3   Title The patient will report resolution of low back pain to 0/10.    Time 6    Period Weeks    Target Date 04/01/20  PT LONG TERM GOAL #4   Title The patient will improve HS length to -30 from full extension (supine 90/90 with contralateral LE extended).    Time 6    Period Weeks    Target Date 04/01/20      PT LONG TERM GOAL #5   Title The patient will imrpove gait speed from 2.67 ft/sec to > or equal to 3.0 ft/sec.    Time 6    Period Weeks    Target Date 04/01/20                 Plan - 02/22/20 1218    Clinical Impression Statement The patient has 6-7/10 pain during transitions and 3-4/10 standing pain today.  This is increased from evaluation on Monday 1/3.  PT and patient discussed dec'ing activities at home.  Plan to continue modifying/progressing patient to tolerance.    PT Treatment/Interventions ADLs/Self Care Home Management;Gait training;Stair training;Functional mobility training;Therapeutic activities;Therapeutic exercise;Balance training;Patient/family education;Manual techniques;Taping    PT Next Visit Plan Progress HEP to tolerance focusing on R hip and ankle strengthening, core stabilization, gait training.    PT Home Exercise Plan [facility]    Consulted and Agree with Plan of Care Patient           Patient will benefit from skilled therapeutic intervention in order to improve the following deficits and impairments:  Pain,Impaired flexibility,Hypomobility,Postural dysfunction,Decreased strength,Decreased range of motion,Difficulty walking,Decreased activity tolerance,Decreased balance  Visit Diagnosis: Muscle weakness (generalized)  Other abnormalities of gait and mobility  Acute low back pain with sciatica, sciatica laterality unspecified, unspecified back pain laterality     Problem List Patient Active Problem List   Diagnosis Date Noted  . Lumbar herniated disc 02/01/2020  . Lumbar radiculopathy 05/22/2019  . Spastic  gait 03/07/2019  . Myalgia 11/01/2018  . Chronic bilateral low back pain without sciatica 07/21/2018  . 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 CIDP (chronic inflammatory demyelinating polyneuropathy) (HCC) 12/30/2017  . Edema 03/01/2017  . Hypokalemia   . Slow transit constipation   . Steroid-induced hyperglycemia   . Urinary retention   . Accidental drug overdose   . Postoperative pain   . Hyponatremia   . Cervical myelopathy (HCC) 02/01/2017  . Shortness of breath at rest   . Hypoglycemia   . Spondylosis, cervical, with myelopathy   . Neuropathic pain   . Benign essential HTN   . Labile blood glucose   . Muscle spasm   . GAD (generalized anxiety disorder)   . Acute inflammatory demyelinating polyneuropathy (HCC) 01/11/2017  . Anxiety state   . Neurogenic bladder   . Depression 12/30/2016  . Leg weakness, bilateral 12/30/2016  . 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HCC) 12/30/2016  . GBS (Guillain Barre syndrome) (HCC)   . AIDP (acute inflammatory demyelinating polyneuropathy) (HCC) 11/27/2016  . Weakness 11/20/2016  . Weakness of both arms 10/30/2016  . 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 Obstructive sleep apnea 03/24/2016  . Numbness 03/18/2016  . Mild nonproliferative diabetic retinopathy of both eyes without macular edema associated with type 2 diabetes mellitus (HCC) 05/24/2015  . Nuclear cataract of both eyes 05/24/2015  . Diabetes mellitus without complication (HCC) 04/06/2015  . 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 Eustachian tube dysfunction 02/12/2015  . Acute maxillary sinusitis 02/12/2015  . Wellness examination 08/22/2014  . Alkaline phosphatase elevation 08/22/2014  . 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 Benign non-nodular prostatic hyperplasia with lower urinary tract symptoms 01/31/2014  . Lesion of  eyelid 09/26/2013  . Screening for prostate cancer 04/24/2013  . Diabetes ([place]) 06/16/2012  . ED  (erectile dysfunction) of organic origin 06/03/2012  . Routine general medical examination at a health care facility 04/10/2012  . BPH (benign prostatic hyperplasia) 02/03/2010  . HEMOCCULT POSITIVE STOOL 05/25/2008  . ELBOW PAIN, RIGHT 07/04/2007  . Dyslipidemia 02/16/2007  . ANXIETY 02/16/2007  . ERECTILE DYSFUNCTION 02/16/2007  . Essential hypertension 02/16/2007  . TESTICULAR MASS, LEFT 02/16/2007  . KNEE PAIN, CHRONIC 02/16/2007  . Michell Heinrich OF 02/16/2007    [place], PT 02/22/2020, 12:40 PM  [facility] [place], [place], [address] Phone: [phone]   Fax:  [phone]  Name: Kenneth Pace MRN: [id number] Date of Birth: [birth year]

## 2020-02-22 NOTE — Patient Instructions (Signed)
Access Code: [facility] URL: [email or web address] Date: 02/22/2020 Prepared by: Margretta Ditty  Program Notes *Exercise should not be painful.  When you are stretching, you will feel a gentle pull, but it should not hurt in your back.   Exercises Supine Hamstring Stretch with Strap - 1 x daily - 7 x weekly - 1 sets - 3 reps - 30 seconds hold Sidelying Clamshell in Neutral - 1 x daily - 7 x weekly - 1 sets - 10 reps Gastroc Stretch on Wall - 1 x daily - 7 x weekly - 1 sets - 3 reps - 30 seconds hold

## 2020-02-26 ENCOUNTER — Encounter: Payer: Medicare Other | Admitting: Physical Therapy

## 2020-02-26 ENCOUNTER — Other Ambulatory Visit: Payer: Self-pay | Admitting: Neurosurgery

## 2020-02-28 ENCOUNTER — Other Ambulatory Visit: Payer: Self-pay | Admitting: Neurosurgery

## 2020-02-29 ENCOUNTER — Encounter: Payer: Medicare Other | Admitting: Physical Therapy

## 2020-03-04 ENCOUNTER — Encounter: Payer: Medicare Other | Admitting: Physical Therapy

## 2020-03-07 ENCOUNTER — Encounter: Payer: Medicare Other | Admitting: Physical Therapy

## 2020-03-08 ENCOUNTER — Other Ambulatory Visit (HOSPITAL_COMMUNITY): Payer: BLUE CROSS/BLUE SHIELD

## 2020-03-08 ENCOUNTER — Inpatient Hospital Stay (HOSPITAL_COMMUNITY): Admission: RE | Admit: 2020-03-08 | Payer: BLUE CROSS/BLUE SHIELD | Source: Ambulatory Visit

## 2020-03-11 ENCOUNTER — Encounter: Payer: Medicare Other | Admitting: Rehabilitative and Restorative Service Providers"

## 2020-03-14 ENCOUNTER — Encounter: Payer: Medicare Other | Admitting: Physical Therapy

## 2020-03-21 NOTE — Progress Notes (Signed)
Surgical Instructions    Your procedure is scheduled on February 8  Report to [facility] Main Entrance "A" at 0530 A.M., then check in with the Admitting office.  Call this number if you have problems the morning of surgery:  [phone]   If you have any questions prior to your surgery date call [phone]: Open Monday-Friday 8am-4pm    Remember:  Do not eat or drink after midnight the night before your surgery   Take these medicines the morning of surgery with A SIP OF WATER  acetaminophen (TYLENOL) if needed for pain baclofen (LIORESAL) methocarbamol (ROBAXIN-750) if needed for muscle spasms oxyCODONE-acetaminophen (PERCOCET) if needed for pain   Follow your surgeon's instructions on when to stop Aspirin.  If no instructions were given by your surgeon then you will need to call the office to get those instructions.    As of today, STOP taking any Aleve, Naproxen, Ibuprofen, Motrin, Advil, Goody's, BC's, all herbal medications, fish oil, and all vitamins, celecoxib (CELEBREX)  Patients with Insulin Pumps    . For patients with Insulin Pumps: o Contact your diabetes doctor for specific instructions before surgery. o Decrease basal insulin rates by 20% at midnight the night before surgery. ? Do not remove your insulin pump prior to arrival to short stay the morning of surgery.  Anesthesia will instruct you on when to remove your pump. o Note that if your surgery is planned to be longer than 2 hours, your insulin pump will be removed and intravenous (IV) insulin will be started and managed by the nurses and anesthesiologist. You will be able to restart your insulin pump once you are awake and able to manage it. o Make sure to bring insulin pump supplies to the hospital with you in case your site needs to be changed.   HOW TO MANAGE YOUR DIABETES BEFORE AND AFTER SURGERY  Why is it important to control my blood sugar before and after surgery? . Improving blood sugar levels  before and after surgery helps healing and can limit problems. . A way of improving blood sugar control is eating a healthy diet by: o  Eating less sugar and carbohydrates o  Increasing activity/exercise o  Talking with your doctor about reaching your blood sugar goals . High blood sugars (greater than 180 mg/dL) can raise your risk of infections and slow your recovery, so you will need to focus on controlling your diabetes during the weeks before surgery. . Make sure that the doctor who takes care of your diabetes knows about your planned surgery including the date and location.  How do I manage my blood sugar before surgery? . Check your blood sugar at least 4 times a day, starting 2 days before surgery, to make sure that the level is not too high or low. . Check your blood sugar the morning of your surgery when you wake up and every 2 hours until you get to the Short Stay unit. o If your blood sugar is less than 70 mg/dL, you will need to treat for low blood sugar: - Do not take insulin. - Treat a low blood sugar (less than 70 mg/dL) with  cup of clear juice (cranberry or apple), 4 glucose tablets, OR glucose gel. - Recheck blood sugar in 15 minutes after treatment (to make sure it is greater than 70 mg/dL). If your blood sugar is not greater than 70 mg/dL on recheck, call [phone] for further instructions. . Report your blood sugar to the short stay  nurse when you get to Short Stay.  . If you are admitted to the hospital after surgery: o Your blood sugar will be checked by the staff and you will probably be given insulin after surgery (instead of oral diabetes medicines) to make sure you have good blood sugar levels. o The goal for blood sugar control after surgery is 80-180 mg/dL.                       Do not wear jewelry            Do not wear lotions, powders, colognes, or deodorant.            Men may shave face and neck.            Do not bring valuables to the hospital.             [facility] is not responsible for any belongings or valuables.  Do NOT Smoke (Tobacco/Vaping) or drink Alcohol 24 hours prior to your procedure If you use a CPAP at night, you may bring all equipment for your overnight stay.   Contacts, glasses, dentures or bridgework may not be worn into surgery, please bring cases for these belongings   For patients admitted to the hospital, discharge time will be determined by your treatment team.   Patients discharged the day of surgery will not be allowed to drive home, and someone needs to stay with them for 24 hours.    Special instructions:   [facility]- Preparing For Surgery  Before surgery, you can play an important role. Because skin is not sterile, your skin needs to be as free of germs as possible. You can reduce the number of germs on your skin by washing with CHG (chlorahexidine gluconate) Soap before surgery.  CHG is an antiseptic cleaner which kills germs and bonds with the skin to continue killing germs even after washing.    Oral Hygiene is also important to reduce your risk of infection.  Remember - BRUSH YOUR TEETH THE MORNING OF SURGERY WITH YOUR REGULAR TOOTHPASTE  Please do not use if you have an allergy to CHG or antibacterial soaps. If your skin becomes reddened/irritated stop using the CHG.  Do not shave (including legs and underarms) for at least 48 hours prior to first CHG shower. It is OK to shave your face.  Please follow these instructions carefully.   1. If you chose to wash your hair, wash your hair first as usual with your normal shampoo.  2. After you shampoo, rinse your hair and body thoroughly to remove the shampoo.  3. Wash Face and genitals (private parts) with your normal soap.   4. THEN Shower the NIGHT BEFORE SURGERY and the MORNING OF SURGERY with CHG Soap.   5. Use CHG as you would any other liquid soap. You can apply CHG directly to the skin and wash gently with a scrungie or a clean washcloth.    6. Apply the CHG Soap to your body ONLY FROM THE NECK DOWN.  Do not use on open wounds or open sores. Avoid contact with your eyes, ears, mouth and genitals (private parts). Wash Face and genitals (private parts)  with your normal soap.   7. Wash thoroughly, paying special attention to the area where your surgery will be performed.  8. Thoroughly rinse your body with warm water from the neck down.  9. DO NOT shower/wash with your normal soap after using and  rinsing off the CHG Soap.  10. Pat yourself dry with a CLEAN TOWEL.  11. Wear CLEAN PAJAMAS to bed the night before surgery  12. Place CLEAN SHEETS on your bed the night before your surgery  13. DO NOT SLEEP WITH PETS.   Day of Surgery: Wear Clean/Comfortable clothing the morning of surgery Do not apply any deodorants/lotions.   Remember to brush your teeth WITH YOUR REGULAR TOOTHPASTE.   Please read over the following fact sheets that you were given.

## 2020-03-22 ENCOUNTER — Encounter (HOSPITAL_COMMUNITY): Payer: Self-pay

## 2020-03-22 ENCOUNTER — Encounter (HOSPITAL_COMMUNITY)
Admission: RE | Admit: 2020-03-22 | Discharge: 2020-03-22 | Disposition: A | Discharge status: Home / Self Care | Payer: Medicare Other | Source: Ambulatory Visit | Attending: Neurosurgery | Admitting: Neurosurgery

## 2020-03-22 ENCOUNTER — Other Ambulatory Visit: Payer: Self-pay

## 2020-03-22 ENCOUNTER — Other Ambulatory Visit (HOSPITAL_COMMUNITY)
Admission: RE | Admit: 2020-03-22 | Discharge: 2020-03-22 | Disposition: A | Discharge status: Home / Self Care | Payer: Medicare Other | Source: Ambulatory Visit | Attending: Neurosurgery | Admitting: Neurosurgery

## 2020-03-22 DIAGNOSIS — Z20822 Contact with and (suspected) exposure to covid-19: Secondary | ICD-10-CM | POA: Insufficient documentation

## 2020-03-22 DIAGNOSIS — Z01812 Encounter for preprocedural laboratory examination: Secondary | ICD-10-CM | POA: Insufficient documentation

## 2020-03-22 LAB — TYPE AND SCREEN
ABO/RH(D): O POS
Antibody Screen: NEGATIVE

## 2020-03-22 LAB — GLUCOSE, CAPILLARY
Glucose-Capillary: 47 mg/dL — ABNORMAL LOW (ref 70–99)
Glucose-Capillary: 52 mg/dL — ABNORMAL LOW (ref 70–99)
Glucose-Capillary: 81 mg/dL (ref 70–99)

## 2020-03-22 LAB — CBC
HCT: 43.8 % (ref 39.0–52.0)
Hemoglobin: 14.5 g/dL (ref 13.0–17.0)
MCH: 29.5 pg (ref 26.0–34.0)
MCHC: 33.1 g/dL (ref 30.0–36.0)
MCV: 89 fL (ref 80.0–100.0)
Platelets: 323 10*3/uL (ref 150–400)
RBC: 4.92 MIL/uL (ref 4.22–5.81)
RDW: 13.1 % (ref 11.5–15.5)
WBC: 9.4 10*3/uL (ref 4.0–10.5)
nRBC: 0 % (ref 0.0–0.2)

## 2020-03-22 LAB — BASIC METABOLIC PANEL
Anion gap: 10 (ref 5–15)
BUN: 13 mg/dL (ref 8–23)
CO2: 27 mmol/L (ref 22–32)
Calcium: 9 mg/dL (ref 8.9–10.3)
Chloride: 99 mmol/L (ref 98–111)
Creatinine, Ser: 0.79 mg/dL (ref 0.61–1.24)
GFR, Estimated: >60 mL/min (ref >60)
Glucose, Bld: 86 mg/dL (ref 70–99)
Potassium: 3.8 mmol/L (ref 3.5–5.1)
Sodium: 136 mmol/L (ref 135–145)

## 2020-03-22 LAB — SURGICAL PCR SCREEN
MRSA, PCR: NEGATIVE
Staphylococcus aureus: NEGATIVE

## 2020-03-22 NOTE — Progress Notes (Addendum)
PCP - Dr. Riki Sheer Cardiologist - Denies  Chest x-ray - Not indicated EKG - 05/18/19 Stress Test - "long time ago" No follow up afterward ECHO - Denies Cardiac Cath - Denies  Sleep Study - Yes has OSA CPAP - Nightly  DM - Type I Fasting Blood Sugar - around 100 or less average CBG @ PAT appt initially 47 after recheck was 52. Rechecked a third time 82 at 1035.  Patient did not feel any symptoms. He said he usually runs low and does not feel symptomatic until he hits the 30's. Checks Blood Sugar has sensor on the pump that checks "every 5 minutes or so". Hasn't worn sensor in the past 3-4 weeks due to insurance issues. Working on it now.  Using home meter right now and checking 3-4 times a day with that.   Spoke to [place], [place] with anesthesia and Diabetic coordinator about his sensor.  Both recommended to contact Dr. Loanne Drilling his endocrinologist to make him aware that the patient is not using the sensor right now but instead his home meter. Patient aware of recommendation.  Diabetic coordinator also recommended if insurance doesn't cover current sensor he may want to check on the freestyle libre sensor.  Aspirin Instructions: Not taken for quite some time, at least 2 weeks. Surgery was rescheduled.  COVID TEST- 03/22/20  Anesthesia review: Yes   Patient denies shortness of breath, fever, cough and chest pain at PAT appointment   All instructions explained to the patient, with a verbal understanding of the material. Patient agrees to go over the instructions while at home for a better understanding. Patient also instructed to self quarantine after being tested for COVID-19. The opportunity to ask questions was provided.

## 2020-03-23 LAB — SARS CORONAVIRUS 2 (TAT 6-24 HRS): SARS Coronavirus 2: NEGATIVE

## 2020-03-25 ENCOUNTER — Other Ambulatory Visit: Payer: Self-pay

## 2020-03-25 ENCOUNTER — Ambulatory Visit (INDEPENDENT_AMBULATORY_CARE_PROVIDER_SITE_OTHER): Payer: Medicare Other | Admitting: Family Medicine

## 2020-03-25 ENCOUNTER — Encounter: Payer: Self-pay | Admitting: Family Medicine

## 2020-03-25 VITALS — BP 120/72 | HR 96 | Temp 98.1°F | Ht 72.0 in | Wt 233.4 lb

## 2020-03-25 DIAGNOSIS — E782 Mixed hyperlipidemia: Secondary | ICD-10-CM

## 2020-03-25 DIAGNOSIS — Z0000 Encounter for general adult medical examination without abnormal findings: Secondary | ICD-10-CM

## 2020-03-25 DIAGNOSIS — Z125 Encounter for screening for malignant neoplasm of prostate: Secondary | ICD-10-CM

## 2020-03-25 DIAGNOSIS — Z23 Encounter for immunization: Secondary | ICD-10-CM | POA: Diagnosis not present

## 2020-03-25 LAB — LIPID PANEL
Cholesterol: 165 mg/dL (ref 0–200)
HDL: 34.7 mg/dL — ABNORMAL LOW (ref >39.00)
LDL Cholesterol: 102 mg/dL — ABNORMAL HIGH (ref 0–99)
NonHDL: 130.59
Total CHOL/HDL Ratio: 5
Triglycerides: 144 mg/dL (ref 0.0–149.0)
VLDL: 28.8 mg/dL (ref 0.0–40.0)

## 2020-03-25 LAB — PSA, MEDICARE: PSA: 2.02 ng/ml (ref 0.10–4.00)

## 2020-03-25 MED ORDER — METHOCARBAMOL 750 MG PO TABS: 750.0000 mg | ORAL_TABLET | Freq: Three times a day (TID) | ORAL | 1 refills | Status: DC | PRN

## 2020-03-25 MED ORDER — FINASTERIDE 5 MG PO TABS: 5.0000 mg | ORAL_TABLET | Freq: Every day | ORAL | 2 refills | Status: DC

## 2020-03-25 MED ORDER — LISINOPRIL-HYDROCHLOROTHIAZIDE 10-12.5 MG PO TABS: 1.0000 | ORAL_TABLET | Freq: Every day | ORAL | 3 refills | Status: DC

## 2020-03-25 NOTE — Patient Instructions (Signed)
Keep the diet clean and stay active. ° °Give us 2-3 business days to get the results of your labs back.  ° °Keep the diet clean and stay active. ° °

## 2020-03-25 NOTE — Anesthesia Preprocedure Evaluation (Addendum)
Anesthesia Evaluation  Patient identified by MRN, date of birth, ID band Patient awake    Reviewed: Allergy & Precautions, NPO status , Patient's Chart, lab work & pertinent test results  History of Anesthesia Complications Negative for: history of anesthetic complications  Airway Mallampati: I  TM Distance: >3 FB Neck ROM: Full    Dental  (+) Teeth Intact, Dental Advisory Given   Pulmonary sleep apnea and Continuous Positive Airway Pressure Ventilation ,    Pulmonary exam normal breath sounds clear to auscultation       Cardiovascular hypertension, Pt. on medications Normal cardiovascular exam Rhythm:Regular Rate:Normal     Neuro/Psych PSYCHIATRIC DISORDERS Anxiety Depression Chronic inflammatory demyelinating polyneuropathy   Neuromuscular disease    GI/Hepatic negative GI ROS, Neg liver ROS,   Endo/Other  diabetes, Type 1, Insulin DependentObesity   Renal/GU negative Renal ROS     Musculoskeletal  (+) Arthritis ,   Abdominal   Peds  Hematology negative hematology ROS (+)   Anesthesia Other Findings   Reproductive/Obstetrics                            Anesthesia Physical Anesthesia Plan  ASA: III  Anesthesia Plan: General   Post-op Pain Management:    Induction: Intravenous  PONV Risk Score and Plan: 3 and Midazolam, Dexamethasone and Ondansetron  Airway Management Planned: Oral ETT  Additional Equipment:   Intra-op Plan:   Post-operative Plan: Extubation in OR  Informed Consent: I have reviewed the patients History and Physical, chart, labs and discussed the procedure including the risks, benefits and alternatives for the proposed anesthesia with the patient or authorized representative who has indicated his/her understanding and acceptance.     Dental advisory given  Plan Discussed with: CRNA  Anesthesia Plan Comments:        Anesthesia Quick Evaluation

## 2020-03-25 NOTE — H&P (Signed)
Chief Complaint   Back pain  HPI   HPI: Kenneth Pace is a 70 y.o. male with longstanding history of chronic back and right leg pain.  In April 2021 he underwent right L4 laminotomy and microdiskectomy for a large right eccentric superiorly migrated disc herniation at L4-5.   Unfortunately several months after his initial surgery, he developed recurrent back and right leg pain   Associated with right footdrop at which time a repeat MRI was ordered revealing a large recurrent disc herniation. He underwent repeat right L4 laminotomy and microdiskectomy.  While his right leg pain and right dorsiflexion weakness has essentially resolved, he has developed slow but progressive worsening primarily lower back and right-sided buttock pain.   His pain is typically brought on with  Ambulation, and has essentially minimal pain when stationary.  He underwent an MRI which did not reveal any other significant pathology an x-ray revealed rather decent decompression at L4-5.  Because of the patient's debilitating pain, we will proceed with more definitive treatment via L4-5 lumbar decompression and fusion.  He presents today for surgery.  He is without any concerns.   Patient Active Problem List   Diagnosis Date Noted  . Lumbar herniated disc 02/01/2020  . Lumbar radiculopathy 05/22/2019  . Spastic gait 03/07/2019  . Myalgia 11/01/2018  . Chronic bilateral low back pain without sciatica 07/21/2018  . 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 CIDP (chronic inflammatory demyelinating polyneuropathy) ([place]) 12/30/2017  . Edema 03/01/2017  . Hypokalemia   . Slow transit constipation   . Steroid-induced hyperglycemia   . Urinary retention   . Accidental drug overdose   . Postoperative pain   . Hyponatremia   . Cervical myelopathy ([place]) 02/01/2017  . Shortness of breath at rest   . Hypoglycemia   . Spondylosis, cervical, with myelopathy   . Neuropathic pain   . Benign essential HTN   . Labile blood  glucose   . Muscle spasm   . GAD (generalized anxiety disorder)   . Acute inflammatory demyelinating polyneuropathy ([place]) 01/11/2017  . Anxiety state   . Neurogenic bladder   . Depression 12/30/2016  . Leg weakness, bilateral 12/30/2016  . 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 GBS (Guillain Barre syndrome) ([place])   . AIDP (acute inflammatory demyelinating polyneuropathy) ([place]) 11/27/2016  . Weakness 11/20/2016  . Weakness of both arms 10/30/2016  . 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 Obstructive sleep apnea 03/24/2016  . Numbness 03/18/2016  . Mild nonproliferative diabetic retinopathy of both eyes without macular edema associated with type 2 diabetes mellitus ([place]) 05/24/2015  . Nuclear cataract of both eyes 05/24/2015  . Diabetes mellitus without complication ([place]) [id number]  . 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 Eustachian tube dysfunction 02/12/2015  . Acute maxillary sinusitis 02/12/2015  . Wellness examination 08/22/2014  . Alkaline phosphatase elevation 08/22/2014  . 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 Benign non-nodular prostatic hyperplasia with lower urinary tract symptoms 01/31/2014  . Lesion of eyelid 09/26/2013  . Screening for prostate cancer 04/24/2013  . Diabetes ([place]) 06/16/2012  . ED (erectile dysfunction) of organic origin 06/03/2012  . Routine general medical examination at a health care facility 04/10/2012  . BPH (benign prostatic hyperplasia) 02/03/2010  . HEMOCCULT POSITIVE STOOL 05/25/2008  . ELBOW PAIN, RIGHT 07/04/2007  . Dyslipidemia 02/16/2007  . ANXIETY 02/16/2007  . ERECTILE DYSFUNCTION 02/16/2007  . Essential hypertension 02/16/2007  . TESTICULAR MASS, LEFT 02/16/2007  . KNEE PAIN, CHRONIC  02/16/2007  . ADENOIDECTOMY, HX OF 02/16/2007    PMH: Past Medical History:  Diagnosis Date  . ADENOIDECTOMY, HX OF 02/16/2007  . Anxiety    pt. states he does not have  anxiety.  Marland Kitchen BENIGN PROSTATIC HYPERTROPHY, WITH OBSTRUCTION 02/03/2010  . 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 DIABETES MELLITUS, TYPE I, UNCONTROLLED 12/29/2006  . ERECTILE DYSFUNCTION 02/16/2007  . Hearing loss    wears hearing aids  . HYPERLIPIDEMIA 02/16/2007  . HYPERTENSION 02/16/2007  . Nerve pain    per patient "on lower back and both legs"  . Sleep apnea    uses CPAP  . TESTICULAR MASS, LEFT 02/16/2007    PSH: Past Surgical History:  Procedure Laterality Date  . ANTERIOR CERVICAL DECOMP/DISCECTOMY FUSION N/A 02/01/2017   Procedure: ANTERIOR CERVICAL DECOMPRESSION/DISCECTOMY FUSION CERVICAL THREE-FOUR , CERVICAL FOUR-FIVE  CERVICAL FIVE-SIX;  Surgeon: Consuella Lose, MD;  Location: [facility];  Service: Neurosurgery;  Laterality: N/A;  . APPENDECTOMY    . BACK SURGERY    . COLONOSCOPY  02/05/2010   [facility]  . EYE SURGERY Right    Dr. Katy Fitch  . KNEE SURGERY     2 Lknee arthroscopy, 3 R knee arthroscpoy  . KNEE SURGERY Right 11/12/2016  . LUMBAR LAMINECTOMY/DECOMPRESSION MICRODISCECTOMY Right 05/22/2019   Procedure: LAMINOTOMY AND MICRODISCECTOMY RIGHT LUMBAR FOUR- LUMBAR FIVE;  Surgeon: Consuella Lose, MD;  Location: [facility];  Service: Neurosurgery;  Laterality: Right;  LAMINOTOMY AND MICRODISCECTOMY RIGHT LUMBAR FOUR- LUMBAR FIVE  . LUMBAR LAMINECTOMY/DECOMPRESSION MICRODISCECTOMY Right 02/01/2020   Procedure: REDO MICRODISCECTOMY RT LUMBAR FOUR-FIVE;  Surgeon: Consuella Lose, MD;  Location: [facility];  Service: Neurosurgery;  Laterality: Right;  posterior  . TONSILLECTOMY      No medications prior to admission.    SH: Social History   Tobacco Use  . Smoking status: Never Smoker  . Smokeless tobacco: Never Used  Vaping Use  . Vaping Use: Never used  Substance Use Topics  . Alcohol use: No  . Drug use: No    MEDS: Prior to Admission medications   Medication Sig Start Date End Date Taking? Authorizing Provider   acetaminophen (TYLENOL) 500 MG tablet Take 500 mg by mouth every 6 (six) hours as needed for mild pain.   Yes [provider]  amitriptyline (ELAVIL) 75 MG tablet TAKE 1 TABLET(75 MG) BY MOUTH AT BEDTIME Patient taking differently: Take 75 mg by mouth at bedtime. 11/27/19  Yes Jamse Arn, MD  B Complex Vitamins (B-COMPLEX/B-12 PO) Take 1 tablet by mouth at bedtime.   Yes [provider]  baclofen (LIORESAL) 10 MG tablet Take 2 tablets (20 mg total) by mouth 3 (three) times daily. Patient taking differently: Take 20-30 mg by mouth 3 (three) times daily. 05/29/19  Yes Patel, Donika K, DO  celecoxib (CELEBREX) 100 MG capsule TAKE 1 CAPSULE(100 MG) BY MOUTH AT BEDTIME Patient taking differently: Take 100 mg by mouth at bedtime. 03/07/19  Yes Jamse Arn, MD  clotrimazole-betamethasone (LOTRISONE) cream Apply 1 application topically 2 (two) times daily. Patient taking differently: Apply 1 application topically daily as needed (Dry skin). 11/02/18  Yes Renato Shin, MD  Cranberry 400 MG TABS Take 400 mg by mouth at bedtime.   Yes [provider]  DULoxetine (CYMBALTA) 60 MG capsule TAKE 1 CAPSULE DAILY Patient taking differently: Take 60 mg by mouth at bedtime. 04/04/19  Yes Patel, Domenick Bookbinder, MD  glucagon 1 MG injection Inject 1 mg into the vein once  as needed. Patient taking differently: Inject 1 mg into the vein once as needed (Low blood gluose). 07/25/13  Yes Renato Shin, MD  Immune Globulin 10% (IMMUNE GLOBULIN 10%) 10G/18mL (10,000mg /141mL) SOLN Inject 105 g into the vein every 6 (six) weeks. 07/20/19  Yes Patel, Donika K, DO  insulin lispro (HUMALOG) 100 UNIT/ML injection INJECT 120 UNITS DAILY IN PUMP 10/23/19  Yes Renato Shin, MD  lidocaine (LIDODERM) 5 % At 7 am and remove at 7 pm. Apply 1 on each side. Patient taking differently: Place 2 patches onto the skin daily as needed (pain). 04/25/19  Yes Jamse Arn, MD  Multiple Vitamin (MULTIVITAMIN)  capsule Take 1 capsule by mouth at bedtime.   Yes [provider]  oxyCODONE-acetaminophen (PERCOCET) 7.5-325 MG tablet Take 1 tablet by mouth every 4 (four) hours as needed for severe pain. 02/02/20  Yes Costella, Vista Mink, PA-C  pseudoephedrine (SUDAFED) 30 MG tablet Take 30 mg by mouth at bedtime as needed for congestion.   Yes [provider]  scopolamine (TRANSDERM-SCOP) 1 MG/3DAYS Place 1 patch (1.5 mg total) onto the skin every 3 (three) days. Patient taking differently: Place 1 patch onto the skin every 3 (three) days. Only when cruising 03/24/19  Yes Wendling, Crosby Oyster, DO  traMADol (ULTRAM) 50 MG tablet Take 50 mg by mouth every 6 (six) hours as needed (mild pain). 01/29/20  Yes [provider]  aspirin EC 81 MG tablet Take 81 mg by mouth daily. Swallow whole.    [provider]  finasteride (PROSCAR) 5 MG tablet Take 1 tablet (5 mg total) by mouth at bedtime. 03/25/20   Shelda Pal, DO  glucose blood (BAYER CONTOUR NEXT TEST) test strip MEDICALLY NECESSARY FOR USE WITH PUMP; Use to check blood sugar 5 times per day and prn; E11.42 02/03/18   Renato Shin, MD  Insulin Human (INSULIN PUMP) SOLN Inject 120 each into the skin daily. HUMALOG    [provider]  Insulin Infusion Pump Supplies (PARADIGM RESERVOIR 3ML) MISC 1 Device by Does not apply route every 3 (three) days. 08/31/18   Renato Shin, MD  Insulin Infusion Pump Supplies (QUICK-SET INFUSION 43" 9MM) MISC 1 Device by Does not apply route every 3 (three) days. 08/31/18   Renato Shin, MD  lisinopril-hydrochlorothiazide (ZESTORETIC) 10-12.5 MG tablet Take 1 tablet by mouth at bedtime. 03/25/20   Shelda Pal, DO  methocarbamol (ROBAXIN-750) 750 MG tablet Take 1 tablet (750 mg total) by mouth 3 (three) times daily as needed for muscle spasms. 03/25/20   Shelda Pal, DO    ALLERGY: No Known Allergies  Social History   Tobacco Use  . Smoking status:  Never Smoker  . Smokeless tobacco: Never Used  Substance Use Topics  . Alcohol use: No     Family History  Problem Relation Age of Onset  . Dementia Mother   . Diabetes Mellitus I Mother   . Hypertension Father        39  . Healthy Sister   . Rheum arthritis Brother   . Cancer Neg Hx   . Colon cancer Neg Hx   . Esophageal cancer Neg Hx   . Stomach cancer Neg Hx   . Rectal cancer Neg Hx   . Colon polyps Neg Hx      ROS   ROS  Exam   There were no vitals filed for this visit. General appearance: WDWN, NAD Eyes: No scleral injection Cardiovascular: Regular rate and rhythm without murmurs,  rubs, gallops. No edema or variciosities. Distal pulses normal. Pulmonary: Effort normal, non-labored breathing Musculoskeletal:     Muscle tone upper extremities: Normal    Muscle tone lower extremities: Normal    Motor exam: Upper Extremities Deltoid Bicep Tricep Grip  Right 5/5 5/5 5/5 5/5  Left 5/5 5/5 5/5 5/5   Lower Extremity IP Quad PF DF EHL  Right 5/5 5/5 5/5 5/5 5/5  Left 5/5 5/5 5/5 5/5 5/5   Neurological Mental Status:    - Patient is awake, alert, oriented to person, place, month, year, and situation    - Patient is able to give a clear and coherent history.    - No signs of aphasia or neglect Cranial Nerves    - II: Visual Fields are full. PERRL    - III/IV/VI: EOMI without ptosis or diploplia.     - V: Facial sensation is grossly normal    - VII: Facial movement is symmetric.     - VIII: hearing is intact to voice    - X: Uvula elevates symmetrically    - XI: Shoulder shrug is symmetric.    - XII: tongue is midline without atrophy or fasciculations.  Sensory: Sensation grossly intact to LT  Results - Imaging/Labs   No results found for this or any previous visit (from the past 48 hour(s)).  No results found.  IMAGING: AP, lateral, and dynamic lumbar spine x-rays done in the office today were reviewed.  While this does demonstrate known degenerative disc  disease with loss of height at L4-5, I do not see any gross spondylolisthesis.  MRI reviewed demonstrating removal of superiorly migrated right L4-5 disc fragment. No significant central stenosis, improved subarticular/foraminal stenosis.  Impression/Plan   70 y.o. male approximately six weeks status post repeat right L4-5 laminotomy and microdiskectomy with markedly worsening in mechanical type low back pain. His MRI does not reveal any other gross pathology.   We will proceed with instrumented stabilization and fusion including diskectomy, placement of interbody cages, and posterior nonsegmental instrumentation with use of BMP at L4-5.  We have reviewed the indications for surgery, the associated risks, benefits and alternatives at length in the office.  All questions today were answered and consent was obtained.  Consuella Lose, MD [facility]

## 2020-03-25 NOTE — Progress Notes (Addendum)
Chief Complaint  Patient presents with  . Annual Exam    Subjective: Pt here for initial Welcome to Medicare Evaluation. He got Medicare last mo.   Vision Screen: Done today;   Visual Acuity Screening   Right eye Left eye Both eyes  Without correction:     With correction: 20/30 20/25 20/25     Past Medical History:  Diagnosis Date  . ADENOIDECTOMY, HX OF 02/16/2007  . Anxiety    pt. states he does not have anxiety.  Marland Kitchen BENIGN PROSTATIC HYPERTROPHY, WITH OBSTRUCTION 02/03/2010  . 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 DIABETES MELLITUS, TYPE I, UNCONTROLLED 12/29/2006  . ERECTILE DYSFUNCTION 02/16/2007  . Hearing loss    wears hearing aids  . HYPERLIPIDEMIA 02/16/2007  . HYPERTENSION 02/16/2007  . Nerve pain    per patient "on lower back and both legs"  . Sleep apnea    uses CPAP  . TESTICULAR MASS, LEFT 02/16/2007   Family History  Problem Relation Age of Onset  . Dementia Mother   . Diabetes Mellitus I Mother   . Hypertension Father        35  . Healthy Sister   . Rheum arthritis Brother   . Cancer Neg Hx   . Colon cancer Neg Hx   . Esophageal cancer Neg Hx   . Stomach cancer Neg Hx   . Rectal cancer Neg Hx   . Colon polyps Neg Hx    Past Surgical History:  Procedure Laterality Date  . ANTERIOR CERVICAL DECOMP/DISCECTOMY FUSION N/A 02/01/2017   Procedure: ANTERIOR CERVICAL DECOMPRESSION/DISCECTOMY FUSION CERVICAL THREE-FOUR , CERVICAL FOUR-FIVE  CERVICAL FIVE-SIX;  Surgeon: Consuella Lose, MD;  Location: [facility];  Service: Neurosurgery;  Laterality: N/A;  . APPENDECTOMY    . BACK SURGERY    . COLONOSCOPY  02/05/2010   [facility]  . EYE SURGERY Right    Dr. Katy Fitch  . KNEE SURGERY     2 Lknee arthroscopy, 3 R knee arthroscpoy  . KNEE SURGERY Right 11/12/2016  . LUMBAR LAMINECTOMY/DECOMPRESSION MICRODISCECTOMY Right 05/22/2019   Procedure: LAMINOTOMY AND MICRODISCECTOMY RIGHT LUMBAR FOUR- LUMBAR FIVE;  Surgeon:  Consuella Lose, MD;  Location: [facility];  Service: Neurosurgery;  Laterality: Right;  LAMINOTOMY AND MICRODISCECTOMY RIGHT LUMBAR FOUR- LUMBAR FIVE  . LUMBAR LAMINECTOMY/DECOMPRESSION MICRODISCECTOMY Right 02/01/2020   Procedure: REDO MICRODISCECTOMY RT LUMBAR FOUR-FIVE;  Surgeon: Consuella Lose, MD;  Location: [facility];  Service: Neurosurgery;  Laterality: Right;  posterior  . TONSILLECTOMY     Current Outpatient Medications on File Prior to Visit  Medication Sig Dispense Refill  . acetaminophen (TYLENOL) 500 MG tablet Take 500 mg by mouth every 6 (six) hours as needed for mild pain.    Marland Kitchen amitriptyline (ELAVIL) 75 MG tablet TAKE 1 TABLET(75 MG) BY MOUTH AT BEDTIME (Patient taking differently: Take 75 mg by mouth at bedtime.) 30 tablet 2  . aspirin EC 81 MG tablet Take 81 mg by mouth daily. Swallow whole.    . B Complex Vitamins (B-COMPLEX/B-12 PO) Take 1 tablet by mouth at bedtime.    . baclofen (LIORESAL) 10 MG tablet Take 2 tablets (20 mg total) by mouth 3 (three) times daily. (Patient taking differently: Take 20-30 mg by mouth 3 (three) times daily.) 540 tablet 3  . celecoxib (CELEBREX) 100 MG capsule TAKE 1 CAPSULE(100 MG) BY MOUTH AT BEDTIME (Patient taking differently: Take 100 mg by mouth at bedtime.) 30 capsule 2  . clotrimazole-betamethasone (LOTRISONE) cream Apply 1 application topically  2 (two) times daily. (Patient taking differently: Apply 1 application topically daily as needed (Dry skin).) 90 g 2  . Cranberry 400 MG TABS Take 400 mg by mouth at bedtime.    . DULoxetine (CYMBALTA) 60 MG capsule TAKE 1 CAPSULE DAILY (Patient taking differently: Take 60 mg by mouth at bedtime.) 90 capsule 3  . glucagon 1 MG injection Inject 1 mg into the vein once as needed. (Patient taking differently: Inject 1 mg into the vein once as needed (Low blood gluose).) 1 each 12  . glucose blood (BAYER CONTOUR NEXT TEST) test strip MEDICALLY NECESSARY FOR USE WITH PUMP; Use to check blood sugar 5 times per  day and prn; E11.42 500 each 3  . Immune Globulin 10% (IMMUNE GLOBULIN 10%) 10G/178mL (10,000mg /132mL) SOLN Inject 105 g into the vein every 6 (six) weeks. 289.5 mL 10  . Insulin Human (INSULIN PUMP) SOLN Inject 120 each into the skin daily. HUMALOG    . Insulin Infusion Pump Supplies (PARADIGM RESERVOIR 3ML) MISC 1 Device by Does not apply route every 3 (three) days. 30 each 3  . Insulin Infusion Pump Supplies (QUICK-SET INFUSION 43" 9MM) MISC 1 Device by Does not apply route every 3 (three) days. 30 each 3  . insulin lispro (HUMALOG) 100 UNIT/ML injection INJECT 120 UNITS DAILY IN PUMP 120 mL 3  . lidocaine (LIDODERM) 5 % At 7 am and remove at 7 pm. Apply 1 on each side. (Patient taking differently: Place 2 patches onto the skin daily as needed (pain).) 180 patch 2  . Multiple Vitamin (MULTIVITAMIN) capsule Take 1 capsule by mouth at bedtime.    Marland Kitchen oxyCODONE-acetaminophen (PERCOCET) 7.5-325 MG tablet Take 1 tablet by mouth every 4 (four) hours as needed for severe pain. 60 tablet 0  . pseudoephedrine (SUDAFED) 30 MG tablet Take 30 mg by mouth at bedtime as needed for congestion.    Marland Kitchen scopolamine (TRANSDERM-SCOP) 1 MG/3DAYS Place 1 patch (1.5 mg total) onto the skin every 3 (three) days. (Patient taking differently: Place 1 patch onto the skin every 3 (three) days. Only when cruising) 10 patch 12  . traMADol (ULTRAM) 50 MG tablet Take 50 mg by mouth every 6 (six) hours as needed (mild pain).     No Known Allergies  Females: Smoking, alcohol/drugs, sunscreen  Mental Health/Substance abuse evaluation: Yes  PHQ-2:  Feelings of depression? No  Loss of satisfaction/pleasure in doing things? No   Fall Risk: Less than 2 falls within the past 12 months? No; due to back and GBS Mindful of grabbing bars in bathroom, ruffles in rugs, poorly lit areas, handrails on the stairs? Yes   Discussion of functional ability done: Encouraged to maintain physical activity and flexibility. Live alone? No;  married Need help with the following? Bathing: No Managing money: No  Taking medications: No  Telephone use: No  Transportation: Yes  Shopping: Yes  Preparing meals: No Needs back surgery.   Hearing/Vision screen: Trouble hearing TV or radio when others do not? Yes; wears hearing aids Straining or struggling to hear/understand conversation? No   Fire safety: Have a working smoke alarm? Yes   Diet Balanced diet? Yes  3 meals daily? Yes  Assistance needed? No   BP 120/72 (BP Location: Left Arm, Patient Position: Sitting, Cuff Size: Large)   Pulse 96   Temp 98.1 F (36.7 C) (Oral)   Ht 6' (1.829 m)   Wt 233 lb 6 oz (105.9 kg)   SpO2 96%   BMI 31.65 kg/m  End of life care planning/counseling: Does patient wish to discuss end of life care/planning? Yes  Does the patient have an advanced directive? No  Patient code status/living will: Full code Forms given? No, forms given  Does not want to be on long term assisted living.   Care team: Me- PCP Owens Loffler MD, Gastroenterologist Narda Amber, DO, Neurologist Consuella Lose, MD, Neurosurgery  Assessment and Plan Welcome to Medicare preventive visit  Mixed hyperlipidemia - Plan: Lipid panel  Screening PSA (prostate specific antigen) - Plan: PSA, Medicare ( [place] only)  Need for vaccination against Streptococcus pneumoniae - Plan: Pneumococcal polysaccharide vaccine 23-valent greater than or equal to 2yo subcutaneous/IM   Colonoscopy- UTD, 11/2017 Hep C Screening- 02/2016  Immunizations updated. GBS after 1st Shingrix  F/u in 6 mo or prn. The patient abd gus soiyse voiced understanding and agreement to the plan.  [place], DO 03/29/20 8:50 AM

## 2020-03-26 ENCOUNTER — Observation Stay (HOSPITAL_COMMUNITY)
Admission: RE | Admit: 2020-03-26 | Discharge: 2020-03-27 | Disposition: A | Discharge status: Home / Self Care | Payer: Medicare Other | Attending: Neurosurgery | Admitting: Neurosurgery

## 2020-03-26 ENCOUNTER — Encounter (HOSPITAL_COMMUNITY): Payer: Self-pay | Admitting: Neurosurgery

## 2020-03-26 ENCOUNTER — Inpatient Hospital Stay (HOSPITAL_COMMUNITY): Payer: Medicare Other

## 2020-03-26 ENCOUNTER — Inpatient Hospital Stay (HOSPITAL_COMMUNITY): Payer: Medicare Other | Admitting: Anesthesiology

## 2020-03-26 ENCOUNTER — Encounter (HOSPITAL_COMMUNITY)
Admission: RE | Disposition: A | Discharge status: Home / Self Care | Payer: Self-pay | Source: Home / Self Care | Attending: Neurosurgery

## 2020-03-26 ENCOUNTER — Inpatient Hospital Stay (HOSPITAL_COMMUNITY): Payer: Medicare Other | Admitting: Vascular Surgery

## 2020-03-26 ENCOUNTER — Ambulatory Visit (HOSPITAL_COMMUNITY): Payer: Medicare Other

## 2020-03-26 DIAGNOSIS — Z79899 Other long term (current) drug therapy: Secondary | ICD-10-CM | POA: Insufficient documentation

## 2020-03-26 DIAGNOSIS — Z794 Long term (current) use of insulin: Secondary | ICD-10-CM | POA: Diagnosis not present

## 2020-03-26 DIAGNOSIS — Z419 Encounter for procedure for purposes other than remedying health state, unspecified: Secondary | ICD-10-CM

## 2020-03-26 DIAGNOSIS — I10 Essential (primary) hypertension: Secondary | ICD-10-CM | POA: Diagnosis not present

## 2020-03-26 DIAGNOSIS — M5416 Radiculopathy, lumbar region: Secondary | ICD-10-CM | POA: Diagnosis present

## 2020-03-26 DIAGNOSIS — E113533 Type 2 diabetes mellitus with proliferative diabetic retinopathy with traction retinal detachment not involving the macula, bilateral: Secondary | ICD-10-CM | POA: Insufficient documentation

## 2020-03-26 DIAGNOSIS — M4727 Other spondylosis with radiculopathy, lumbosacral region: Principal | ICD-10-CM | POA: Insufficient documentation

## 2020-03-26 DIAGNOSIS — Z7982 Long term (current) use of aspirin: Secondary | ICD-10-CM | POA: Insufficient documentation

## 2020-03-26 HISTORY — PX: LUMBAR FUSION: SHX111

## 2020-03-26 LAB — POCT I-STAT, CHEM 8
BUN: 8 mg/dL (ref 8–23)
BUN: 18 mg/dL (ref 8–23)
BUN: 22 mg/dL (ref 8–23)
Calcium, Ion: 1.1 mmol/L — ABNORMAL LOW (ref 1.15–1.40)
Calcium, Ion: 1.18 mmol/L (ref 1.15–1.40)
Calcium, Ion: 1.11 mmol/L — ABNORMAL LOW (ref 1.15–1.40)
Chloride: 98 mmol/L (ref 98–111)
Chloride: 99 mmol/L (ref 98–111)
Chloride: 100 mmol/L (ref 98–111)
Creatinine, Ser: <0.2 mg/dL — ABNORMAL LOW (ref 0.61–1.24)
Creatinine, Ser: 0.7 mg/dL (ref 0.61–1.24)
Creatinine, Ser: 0.5 mg/dL — ABNORMAL LOW (ref 0.61–1.24)
Glucose, Bld: 88 mg/dL (ref 70–99)
Glucose, Bld: 198 mg/dL — ABNORMAL HIGH (ref 70–99)
Glucose, Bld: 232 mg/dL — ABNORMAL HIGH (ref 70–99)
HCT: 22 % — ABNORMAL LOW (ref 39.0–52.0)
HCT: 44 % (ref 39.0–52.0)
HCT: 35 % — ABNORMAL LOW (ref 39.0–52.0)
Hemoglobin: 7.5 g/dL — ABNORMAL LOW (ref 13.0–17.0)
Hemoglobin: 15 g/dL (ref 13.0–17.0)
Hemoglobin: 11.9 g/dL — ABNORMAL LOW (ref 13.0–17.0)
Potassium: 3.9 mmol/L (ref 3.5–5.1)
Potassium: 4.2 mmol/L (ref 3.5–5.1)
Potassium: 4.6 mmol/L (ref 3.5–5.1)
Sodium: 133 mmol/L — ABNORMAL LOW (ref 135–145)
Sodium: 136 mmol/L (ref 135–145)
Sodium: 136 mmol/L (ref 135–145)
TCO2: 14 mmol/L — ABNORMAL LOW (ref 22–32)
TCO2: 27 mmol/L (ref 22–32)
TCO2: 26 mmol/L (ref 22–32)

## 2020-03-26 LAB — GLUCOSE, CAPILLARY
Glucose-Capillary: 149 mg/dL — ABNORMAL HIGH (ref 70–99)
Glucose-Capillary: 236 mg/dL — ABNORMAL HIGH (ref 70–99)
Glucose-Capillary: 237 mg/dL — ABNORMAL HIGH (ref 70–99)
Glucose-Capillary: 207 mg/dL — ABNORMAL HIGH (ref 70–99)
Glucose-Capillary: 225 mg/dL — ABNORMAL HIGH (ref 70–99)
Glucose-Capillary: 409 mg/dL — ABNORMAL HIGH (ref 70–99)

## 2020-03-26 SURGERY — POSTERIOR LUMBAR FUSION 1 LEVEL
Anesthesia: General | Site: Spine Lumbar

## 2020-03-26 MED ORDER — DEXAMETHASONE SODIUM PHOSPHATE 10 MG/ML IJ SOLN
INTRAMUSCULAR | Status: DC | PRN
  Administered 2020-03-26: 10 mg via INTRAVENOUS

## 2020-03-26 MED ORDER — CEFAZOLIN SODIUM-DEXTROSE 2-4 GM/100ML-% IV SOLN
2.0000 g | INTRAVENOUS | Status: DC
  Filled 2020-03-26: qty 100

## 2020-03-26 MED ORDER — DOCUSATE SODIUM 100 MG PO CAPS
100.0000 mg | ORAL_CAPSULE | Freq: Two times a day (BID) | ORAL | Status: DC
  Administered 2020-03-26 – 2020-03-27 (×2): 100 mg via ORAL
  Filled 2020-03-26 (×2): qty 1

## 2020-03-26 MED ORDER — CHLORHEXIDINE GLUCONATE CLOTH 2 % EX PADS: 6.0000 | MEDICATED_PAD | Freq: Once | CUTANEOUS | Status: DC

## 2020-03-26 MED ORDER — ZOLPIDEM TARTRATE 5 MG PO TABS: 5.0000 mg | ORAL_TABLET | Freq: Every evening | ORAL | Status: DC | PRN

## 2020-03-26 MED ORDER — HYDROMORPHONE HCL 1 MG/ML IJ SOLN
0.5000 mg | INTRAMUSCULAR | Status: DC | PRN
  Administered 2020-03-26 – 2020-03-27 (×3): 1 mg via INTRAVENOUS
  Filled 2020-03-26 (×3): qty 1

## 2020-03-26 MED ORDER — SUGAMMADEX SODIUM 200 MG/2ML IV SOLN
INTRAVENOUS | Status: DC | PRN
  Administered 2020-03-26: 200 mg via INTRAVENOUS

## 2020-03-26 MED ORDER — ONDANSETRON HCL 4 MG/2ML IJ SOLN: 4.0000 mg | Freq: Four times a day (QID) | INTRAMUSCULAR | Status: DC | PRN

## 2020-03-26 MED ORDER — THROMBIN 5000 UNITS EX SOLR
OROMUCOSAL | Status: DC | PRN
  Administered 2020-03-26: 5 mL via TOPICAL

## 2020-03-26 MED ORDER — LIDOCAINE HCL (CARDIAC) PF 100 MG/5ML IV SOSY
PREFILLED_SYRINGE | INTRAVENOUS | Status: DC | PRN
  Administered 2020-03-26: 100 mg via INTRAVENOUS

## 2020-03-26 MED ORDER — BUPIVACAINE HCL (PF) 0.5 % IJ SOLN
INTRAMUSCULAR | Status: AC
  Filled 2020-03-26: qty 30

## 2020-03-26 MED ORDER — ACETAMINOPHEN 650 MG RE SUPP: 650.0000 mg | RECTAL | Status: DC | PRN

## 2020-03-26 MED ORDER — SODIUM CHLORIDE 0.9% FLUSH: 3.0000 mL | INTRAVENOUS | Status: DC | PRN

## 2020-03-26 MED ORDER — AMITRIPTYLINE HCL 25 MG PO TABS
75.0000 mg | ORAL_TABLET | Freq: Every day | ORAL | Status: DC
  Administered 2020-03-26: 75 mg via ORAL
  Filled 2020-03-26: qty 1
  Filled 2020-03-26 (×2): qty 3
  Filled 2020-03-26: qty 1

## 2020-03-26 MED ORDER — CHLORHEXIDINE GLUCONATE 0.12 % MT SOLN
15.0000 mL | Freq: Once | OROMUCOSAL | Status: AC
  Administered 2020-03-26: 15 mL via OROMUCOSAL
  Filled 2020-03-26: qty 15

## 2020-03-26 MED ORDER — FENTANYL CITRATE (PF) 100 MCG/2ML IJ SOLN
INTRAMUSCULAR | Status: AC
  Filled 2020-03-26: qty 2

## 2020-03-26 MED ORDER — SODIUM CHLORIDE 0.9 % IV SOLN: INTRAVENOUS | Status: DC | PRN

## 2020-03-26 MED ORDER — ACETAMINOPHEN 500 MG PO TABS
1000.0000 mg | ORAL_TABLET | Freq: Once | ORAL | Status: AC
  Administered 2020-03-26: 1000 mg via ORAL
  Filled 2020-03-26: qty 2

## 2020-03-26 MED ORDER — INSULIN PUMP
Freq: Three times a day (TID) | SUBCUTANEOUS | Status: DC
  Administered 2020-03-26: 7.1 via SUBCUTANEOUS
  Administered 2020-03-27: 12.1 via SUBCUTANEOUS
  Filled 2020-03-26: qty 1

## 2020-03-26 MED ORDER — ORAL CARE MOUTH RINSE: 15.0000 mL | Freq: Once | OROMUCOSAL | Status: AC

## 2020-03-26 MED ORDER — FINASTERIDE 5 MG PO TABS
5.0000 mg | ORAL_TABLET | Freq: Every day | ORAL | Status: DC
  Administered 2020-03-26: 5 mg via ORAL
  Filled 2020-03-26: qty 1

## 2020-03-26 MED ORDER — LIDOCAINE 2% (20 MG/ML) 5 ML SYRINGE
INTRAMUSCULAR | Status: AC
  Filled 2020-03-26: qty 5

## 2020-03-26 MED ORDER — PHENYLEPHRINE 40 MCG/ML (10ML) SYRINGE FOR IV PUSH (FOR BLOOD PRESSURE SUPPORT)
PREFILLED_SYRINGE | INTRAVENOUS | Status: AC
  Filled 2020-03-26: qty 10

## 2020-03-26 MED ORDER — DEXAMETHASONE SODIUM PHOSPHATE 10 MG/ML IJ SOLN
INTRAMUSCULAR | Status: AC
  Filled 2020-03-26: qty 1

## 2020-03-26 MED ORDER — PROPOFOL 10 MG/ML IV BOLUS
INTRAVENOUS | Status: AC
  Filled 2020-03-26: qty 20

## 2020-03-26 MED ORDER — FENTANYL CITRATE (PF) 100 MCG/2ML IJ SOLN
25.0000 ug | INTRAMUSCULAR | Status: DC | PRN
  Administered 2020-03-26 (×3): 50 ug via INTRAVENOUS

## 2020-03-26 MED ORDER — LIDOCAINE-EPINEPHRINE 1 %-1:100000 IJ SOLN
INTRAMUSCULAR | Status: AC
  Filled 2020-03-26: qty 1

## 2020-03-26 MED ORDER — LISINOPRIL-HYDROCHLOROTHIAZIDE 10-12.5 MG PO TABS: 1.0000 | ORAL_TABLET | Freq: Every day | ORAL | Status: DC

## 2020-03-26 MED ORDER — LACTATED RINGERS IV SOLN: INTRAVENOUS | Status: DC | PRN

## 2020-03-26 MED ORDER — SODIUM CHLORIDE 0.9% FLUSH: 3.0000 mL | Freq: Two times a day (BID) | INTRAVENOUS | Status: DC

## 2020-03-26 MED ORDER — ALBUMIN HUMAN 5 % IV SOLN: INTRAVENOUS | Status: DC | PRN

## 2020-03-26 MED ORDER — MIDAZOLAM HCL 2 MG/2ML IJ SOLN
INTRAMUSCULAR | Status: DC | PRN
  Administered 2020-03-26: 2 mg via INTRAVENOUS

## 2020-03-26 MED ORDER — ACETAMINOPHEN 500 MG PO TABS
1000.0000 mg | ORAL_TABLET | Freq: Four times a day (QID) | ORAL | Status: AC
  Administered 2020-03-26 – 2020-03-27 (×4): 1000 mg via ORAL
  Filled 2020-03-26 (×4): qty 2

## 2020-03-26 MED ORDER — ONDANSETRON HCL 4 MG/2ML IJ SOLN
INTRAMUSCULAR | Status: DC | PRN
  Administered 2020-03-26: 4 mg via INTRAVENOUS

## 2020-03-26 MED ORDER — INSULIN REGULAR(HUMAN) IN NACL 100-0.9 UT/100ML-% IV SOLN
INTRAVENOUS | Status: AC
  Administered 2020-03-26: 5.5 [IU]/h via INTRAVENOUS
  Filled 2020-03-26: qty 100

## 2020-03-26 MED ORDER — METHOCARBAMOL 1000 MG/10ML IJ SOLN
500.0000 mg | Freq: Four times a day (QID) | INTRAVENOUS | Status: DC | PRN
  Filled 2020-03-26 (×2): qty 5

## 2020-03-26 MED ORDER — HYDROCODONE-ACETAMINOPHEN 5-325 MG PO TABS: 1.0000 | ORAL_TABLET | ORAL | Status: DC | PRN

## 2020-03-26 MED ORDER — ONDANSETRON HCL 4 MG/2ML IJ SOLN
INTRAMUSCULAR | Status: AC
  Filled 2020-03-26: qty 2

## 2020-03-26 MED ORDER — PHENOL 1.4 % MT LIQD: 1.0000 | OROMUCOSAL | Status: DC | PRN

## 2020-03-26 MED ORDER — BUPIVACAINE HCL (PF) 0.5 % IJ SOLN
INTRAMUSCULAR | Status: DC | PRN
  Administered 2020-03-26: 4 mL

## 2020-03-26 MED ORDER — ACETAMINOPHEN 325 MG PO TABS: 650.0000 mg | ORAL_TABLET | ORAL | Status: DC | PRN

## 2020-03-26 MED ORDER — MIDAZOLAM HCL 2 MG/2ML IJ SOLN
INTRAMUSCULAR | Status: AC
  Filled 2020-03-26: qty 2

## 2020-03-26 MED ORDER — MENTHOL 3 MG MT LOZG: 1.0000 | LOZENGE | OROMUCOSAL | Status: DC | PRN

## 2020-03-26 MED ORDER — THROMBIN 5000 UNITS EX SOLR
CUTANEOUS | Status: AC
  Filled 2020-03-26: qty 5000

## 2020-03-26 MED ORDER — SENNOSIDES-DOCUSATE SODIUM 8.6-50 MG PO TABS: 1.0000 | ORAL_TABLET | Freq: Every evening | ORAL | Status: DC | PRN

## 2020-03-26 MED ORDER — PROMETHAZINE HCL 25 MG/ML IJ SOLN: 6.2500 mg | INTRAMUSCULAR | Status: DC | PRN

## 2020-03-26 MED ORDER — SODIUM CHLORIDE 0.9 % IV SOLN: 250.0000 mL | INTRAVENOUS | Status: DC

## 2020-03-26 MED ORDER — LACTATED RINGERS IV SOLN: INTRAVENOUS | Status: DC

## 2020-03-26 MED ORDER — HYDROCHLOROTHIAZIDE 12.5 MG PO CAPS
12.5000 mg | ORAL_CAPSULE | Freq: Every day | ORAL | Status: DC
  Administered 2020-03-26: 12.5 mg via ORAL
  Filled 2020-03-26: qty 1

## 2020-03-26 MED ORDER — SUFENTANIL CITRATE 50 MCG/ML IV SOLN
INTRAVENOUS | Status: AC
  Filled 2020-03-26: qty 1

## 2020-03-26 MED ORDER — OXYCODONE HCL 5 MG PO TABS
5.0000 mg | ORAL_TABLET | ORAL | Status: DC | PRN
  Administered 2020-03-26 – 2020-03-27 (×6): 10 mg via ORAL
  Filled 2020-03-26 (×6): qty 2

## 2020-03-26 MED ORDER — PHENYLEPHRINE HCL-NACL 10-0.9 MG/250ML-% IV SOLN
INTRAVENOUS | Status: DC | PRN
  Administered 2020-03-26: 50 ug/min via INTRAVENOUS

## 2020-03-26 MED ORDER — DULOXETINE HCL 30 MG PO CPEP
60.0000 mg | ORAL_CAPSULE | Freq: Every day | ORAL | Status: DC
  Administered 2020-03-26: 60 mg via ORAL
  Filled 2020-03-26: qty 2

## 2020-03-26 MED ORDER — METHOCARBAMOL 500 MG PO TABS
500.0000 mg | ORAL_TABLET | Freq: Four times a day (QID) | ORAL | Status: DC | PRN
  Administered 2020-03-26 – 2020-03-27 (×2): 500 mg via ORAL
  Filled 2020-03-26 (×2): qty 1

## 2020-03-26 MED ORDER — SODIUM CHLORIDE 0.9 % IV SOLN: INTRAVENOUS | Status: DC

## 2020-03-26 MED ORDER — SUFENTANIL CITRATE 50 MCG/ML IV SOLN
INTRAVENOUS | Status: DC | PRN
  Administered 2020-03-26: 10 ug via INTRAVENOUS

## 2020-03-26 MED ORDER — 0.9 % SODIUM CHLORIDE (POUR BTL) OPTIME
TOPICAL | Status: DC | PRN
  Administered 2020-03-26: 1000 mL

## 2020-03-26 MED ORDER — PROPOFOL 10 MG/ML IV BOLUS
INTRAVENOUS | Status: DC | PRN
  Administered 2020-03-26: 200 mg via INTRAVENOUS

## 2020-03-26 MED ORDER — ONDANSETRON HCL 4 MG PO TABS: 4.0000 mg | ORAL_TABLET | Freq: Four times a day (QID) | ORAL | Status: DC | PRN

## 2020-03-26 MED ORDER — ROCURONIUM 10MG/ML (10ML) SYRINGE FOR MEDFUSION PUMP - OPTIME
INTRAVENOUS | Status: DC | PRN
  Administered 2020-03-26: 100 mg via INTRAVENOUS

## 2020-03-26 MED ORDER — LISINOPRIL 10 MG PO TABS: 10.0000 mg | ORAL_TABLET | Freq: Every day | ORAL | Status: DC

## 2020-03-26 MED ORDER — BACLOFEN 10 MG PO TABS
20.0000 mg | ORAL_TABLET | Freq: Three times a day (TID) | ORAL | Status: DC
Start: 2020-03-26 — End: 2020-03-27
  Administered 2020-03-26 – 2020-03-27 (×3): 20 mg via ORAL
  Filled 2020-03-26 (×3): qty 2

## 2020-03-26 MED ORDER — SODIUM CHLORIDE (PF) 0.9 % IJ SOLN
INTRAMUSCULAR | Status: AC
  Filled 2020-03-26: qty 10

## 2020-03-26 MED ORDER — INSULIN PUMP: 120.0000 | Freq: Every day | SUBCUTANEOUS | Status: DC

## 2020-03-26 MED ORDER — ROCURONIUM BROMIDE 10 MG/ML (PF) SYRINGE
PREFILLED_SYRINGE | INTRAVENOUS | Status: AC
  Filled 2020-03-26: qty 10

## 2020-03-26 MED ORDER — SENNA 8.6 MG PO TABS
1.0000 | ORAL_TABLET | Freq: Two times a day (BID) | ORAL | Status: DC
  Administered 2020-03-26 – 2020-03-27 (×2): 8.6 mg via ORAL
  Filled 2020-03-26 (×2): qty 1

## 2020-03-26 MED ORDER — FLEET ENEMA 7-19 GM/118ML RE ENEM: 1.0000 | ENEMA | Freq: Once | RECTAL | Status: DC | PRN

## 2020-03-26 MED ORDER — LIDOCAINE-EPINEPHRINE 1 %-1:100000 IJ SOLN
INTRAMUSCULAR | Status: DC | PRN
  Administered 2020-03-26: 4 mL

## 2020-03-26 MED ORDER — CEFAZOLIN SODIUM-DEXTROSE 2-4 GM/100ML-% IV SOLN
2.0000 g | Freq: Three times a day (TID) | INTRAVENOUS | Status: AC
  Administered 2020-03-26 (×2): 2 g via INTRAVENOUS
  Filled 2020-03-26 (×2): qty 100

## 2020-03-26 MED ORDER — BISACODYL 10 MG RE SUPP: 10.0000 mg | Freq: Every day | RECTAL | Status: DC | PRN

## 2020-03-26 MED ORDER — PHENYLEPHRINE HCL (PRESSORS) 10 MG/ML IV SOLN
INTRAVENOUS | Status: DC | PRN
  Administered 2020-03-26 (×3): 80 ug via INTRAVENOUS

## 2020-03-26 SURGICAL SUPPLY — 72 items
ADH SKN CLS APL DERMABOND .7 (GAUZE/BANDAGES/DRESSINGS) ×1
APL SKNCLS STERI-STRIP NONHPOA (GAUZE/BANDAGES/DRESSINGS)
BASKET BONE COLLECTION (BASKET) ×3 IMPLANT
BENZOIN TINCTURE PRP APPL 2/3 (GAUZE/BANDAGES/DRESSINGS) IMPLANT
BLADE CLIPPER SURG (BLADE) IMPLANT
BLADE SURG 11 STRL SS (BLADE) ×3 IMPLANT
BUR MATCHSTICK NEURO 3.0 LAGG (BURR) ×3 IMPLANT
BUR PRECISION FLUTE 5.0 (BURR) ×3 IMPLANT
CANISTER SUCT 3000ML PPV (MISCELLANEOUS) ×3 IMPLANT
CARTRIDGE OIL MAESTRO DRILL (MISCELLANEOUS) ×1 IMPLANT
CLOSURE WOUND 1/2 X4 (GAUZE/BANDAGES/DRESSINGS)
CNTNR URN SCR LID CUP LEK RST (MISCELLANEOUS) ×1 IMPLANT
CONT SPEC 4OZ STRL OR WHT (MISCELLANEOUS) ×3
COVER BACK TABLE 60X90IN (DRAPES) ×3 IMPLANT
COVER WAND RF STERILE (DRAPES) IMPLANT
DECANTER SPIKE VIAL GLASS SM (MISCELLANEOUS) ×3 IMPLANT
DERMABOND ADVANCED (GAUZE/BANDAGES/DRESSINGS) ×2
DERMABOND ADVANCED .7 DNX12 (GAUZE/BANDAGES/DRESSINGS) ×1 IMPLANT
DIFFUSER DRILL AIR PNEUMATIC (MISCELLANEOUS) ×3 IMPLANT
DRAPE C-ARM 42X72 X-RAY (DRAPES) ×3 IMPLANT
DRAPE C-ARMOR (DRAPES) ×3 IMPLANT
DRAPE LAPAROTOMY 100X72X124 (DRAPES) ×3 IMPLANT
DRAPE SURG 17X23 STRL (DRAPES) ×3 IMPLANT
DRSG OPSITE POSTOP 4X6 (GAUZE/BANDAGES/DRESSINGS) ×3 IMPLANT
DURAPREP 26ML APPLICATOR (WOUND CARE) ×3 IMPLANT
ELECT REM PT RETURN 9FT ADLT (ELECTROSURGICAL) ×3
ELECTRODE REM PT RTRN 9FT ADLT (ELECTROSURGICAL) ×1 IMPLANT
GAUZE 4X4 16PLY RFD (DISPOSABLE) IMPLANT
GAUZE SPONGE 4X4 12PLY STRL (GAUZE/BANDAGES/DRESSINGS) IMPLANT
GLOVE BIO SURGEON STRL SZ7.5 (GLOVE) ×6 IMPLANT
GLOVE BIOGEL PI IND STRL 7.5 (GLOVE) ×2 IMPLANT
GLOVE BIOGEL PI INDICATOR 7.5 (GLOVE) ×4
GLOVE ECLIPSE 7.0 STRL STRAW (GLOVE) ×6 IMPLANT
GLOVE EXAM NITRILE XL STR (GLOVE) IMPLANT
GLOVE SURG UNDER POLY LF SZ6.5 (GLOVE) ×3 IMPLANT
GOWN STRL REUS W/ TWL LRG LVL3 (GOWN DISPOSABLE) ×6 IMPLANT
GOWN STRL REUS W/ TWL XL LVL3 (GOWN DISPOSABLE) IMPLANT
GOWN STRL REUS W/TWL 2XL LVL3 (GOWN DISPOSABLE) IMPLANT
GOWN STRL REUS W/TWL LRG LVL3 (GOWN DISPOSABLE) ×18
GOWN STRL REUS W/TWL XL LVL3 (GOWN DISPOSABLE)
GRAFT BONE PROTEIOS XS 0.5CC (Orthopedic Implant) ×3 IMPLANT
HEMOSTAT POWDER KIT SURGIFOAM (HEMOSTASIS) ×3 IMPLANT
KIT BASIN OR (CUSTOM PROCEDURE TRAY) ×3 IMPLANT
KIT POSITION SURG JACKSON T1 (MISCELLANEOUS) ×3 IMPLANT
KIT TURNOVER KIT B (KITS) ×3 IMPLANT
MILL MEDIUM DISP (BLADE) ×3 IMPLANT
NEEDLE HYPO 18GX1.5 BLUNT FILL (NEEDLE) IMPLANT
NEEDLE HYPO 22GX1.5 SAFETY (NEEDLE) ×3 IMPLANT
NEEDLE SPNL 18GX3.5 QUINCKE PK (NEEDLE) ×3 IMPLANT
NS IRRIG 1000ML POUR BTL (IV SOLUTION) ×3 IMPLANT
OIL CARTRIDGE MAESTRO DRILL (MISCELLANEOUS) ×3
PACK LAMINECTOMY NEURO (CUSTOM PROCEDURE TRAY) ×3 IMPLANT
PAD ARMBOARD 7.5X6 YLW CONV (MISCELLANEOUS) ×9 IMPLANT
PATTIES SURGICAL 1X1 (DISPOSABLE) ×3 IMPLANT
PUTTY DBF GRAFTON 3CC W/DELIVE (Putty) ×3 IMPLANT
ROD CC 30MM (Rod) ×6 IMPLANT
SCREW 5.5X35MM (Screw) ×12 IMPLANT
SCREW BN 35X5.5XMA NS SPNE (Screw) ×4 IMPLANT
SCREW SET SOLERA (Screw) ×12 IMPLANT
SCREW SET SOLERA TI (Screw) ×4 IMPLANT
SPACER TI 12X25X12 10D (Cage) ×3 IMPLANT
SPONGE LAP 4X18 RFD (DISPOSABLE) IMPLANT
SPONGE SURGIFOAM ABS GEL 100 (HEMOSTASIS) IMPLANT
STRIP CLOSURE SKIN 1/2X4 (GAUZE/BANDAGES/DRESSINGS) IMPLANT
SUT VIC AB 0 CT1 18XCR BRD8 (SUTURE) ×3 IMPLANT
SUT VIC AB 0 CT1 8-18 (SUTURE) ×9
SUT VICRYL 3-0 RB1 18 ABS (SUTURE) ×6 IMPLANT
SYR 3ML LL SCALE MARK (SYRINGE) ×15 IMPLANT
TOWEL GREEN STERILE (TOWEL DISPOSABLE) ×3 IMPLANT
TOWEL GREEN STERILE FF (TOWEL DISPOSABLE) ×3 IMPLANT
TRAY FOLEY MTR SLVR 16FR STAT (SET/KITS/TRAYS/PACK) ×3 IMPLANT
WATER STERILE IRR 1000ML POUR (IV SOLUTION) ×3 IMPLANT

## 2020-03-26 NOTE — Op Note (Signed)
NEUROSURGERY OPERATIVE NOTE   PREOP DIAGNOSIS:  1. Lumbar spondylosis with radiculopathy, L4-5  POSTOP DIAGNOSIS: Same  PROCEDURE: 1. L4 laminectomy with facetectomy for decompression of exiting nerve roots, more than would be required for placement of interbody graft 2. Placement of anterior interbody device - Medtronic 72mm x 75mm 10degree lordotic TLIF cage 3. Posterior non-segmental instrumentation using cortical pedicle screws at L4-5 - Medtronic Solera 5.5 x 54mm screws 4. Interbody arthrodesis, L4-5 5. Use of locally harvested bone autograft 6. Use of non-structural bone allograft - Proteos  SURGEON: Dr. Consuella Lose, MD  ASSISTANT: Dr. Emelda Brothers, MD  ANESTHESIA: General Endotracheal  EBL: 400cc  SPECIMENS: None  DRAINS: None  COMPLICATIONS: None immediate  CONDITION: Hemodynamically stable to PACU  HISTORY: Kenneth Pace is a 70 y.o. male who has been followed in the outpatient clinic. He has previously undergone right-sided L4-5 laminotomy and microdiskectomy with good improvement in pain.  He presented again with recurrent right-sided leg pain and recurrent disc herniation on imaging.  He underwent repeat laminotomy and microdiskectomy about two months ago.  While he had improvement in his radicular symptoms, he was complaining of severe mechanical type back pain.  Imaging  did not demonstrate any significant recurrent disc herniation, although he did continue to have relatively broad-based disc protrusion at L4-5.  With the severity of his pain and the likelihood of iatrogenic instability, he elected to proceed with surgical decompression and fusion.  The risks, benefits, and alternatives to surgery were all reviewed in detail with the patient and his wife.  After all questions were answered informed consent was obtained and witnessed.  PROCEDURE IN DETAIL: The patient was brought to the operating room via stretcher. After induction of general  anesthesia, the patient was positioned on the operative table in the prone position. All pressure points were meticulously padded. Incision was then marked out and prepped and draped in the usual sterile fashion.  After timeout was conducted, skin was infiltrated with local anesthetic.   Previous skin incision was then  opened sharply and Bovie electrocautery was used to dissect the subcutaneous tissue until the lumbodorsal fascia was identified and incised. The muscle was then elevated in the subperiosteal plane and the  L4 lamina and  L4-5 facet complexes were identified.  Right-sided L4 laminotomy defect was identified, as was the remainder of the pars interarticularis.Self-retaining retractors were then placed. Lateral fluoroscopy was taken with a dissector in the L4-5 interspace to confirm our location.  At this point attention was turned to decompression. Complete  L4 laminectomy was completed with a high-speed drill and Kerrison punches.  Normal dura was identified on the left side.  A ball-tipped dissector was then used to identify the foramina  on the left at L4-5.  High-speed drill was used to cut across the pars interarticularis and the inferior articulating process of  L4 was removed.   I then further dissected the right-sided pars to identify the right L4-5 foramen from the outside.  High-speed drill was then used to cut across the pars and the inferior articulating process on the right was removed.  Further bone removal at the superior aspect of L5 was done with the Kerrison punches.  This allowed removal of the remainder of the ligamentum flavum and the medial aspect of the superior articulating process of L5.  The traversing L5 roots were then identified.  The foramina were unroofed bilaterally to identify both exiting L4 nerve roots for a length of approximately 2 cm.  Once this was done I was able to easily pass a long ball dissector on top of the nerve roots well lateral to the foramina  indicating good decompression.   Because of a significant amount of epidural fibrosis on the right side, I elected not to attempt diskectomy or interbody cage placement from the right side because of the likely need for significant thecal sac and nerve root retraction.  The left side of the disc space was incised and using a combination of shavers, curettes and rongeurs, complete discectomy was completed. I was able to remove the majority of the disc on the right side including disc underneath the thecal sac using long angled dissectors and curettes. Endplates were prepared with rasps, and trials were used to select a 46mm TLIF cage with 47mm length and 10 degrees lordosis.   The cage was then inserted under live fluoroscopy.  I removed more disc material on the lateral aspect of the interspace and this allowed placement of the cage in the anterior aspect of the disc space and allowed positioning  of the cage horizontally. Bone harvested during decompression was mixed with Proteos and packed into the interspace.   At this point, the entry points for bilateral L4 and L5 cortical pedicle screws were identified using standard anatomic landmarks and lateral fluoro. Pilot holes were then drilled and tapped to 5.5 x 30mm. Screws were then placed in L4 and L5. Prebent lordotic rod was then sized and placed into the pedicle screws. Set screws were placed and final tightened. Final AP and lateral fluoroscopic images confirmed good position.  Hemostasis was secured and confirmed with bipolar cautery and morcellized gelfoam with thrombin. The wound was then irrigated with copious amounts of antibiotic saline, then closed in standard fashion using a combination of interrupted 0 and 3-0 Vicryl stitches in the muscular, fascial, and subcutaneous layers. Skin was then closed using standard Dermabond. Sterile dressing was then applied. The patient was then transferred to the stretcher, extubated, and taken to the  postanesthesia care unit in stable hemodynamic condition.  At the end of the case all sponge, needle, cottonoid, and instrument counts were correct.

## 2020-03-26 NOTE — Progress Notes (Signed)
Orthopedic Tech Progress Note Patient Details:  Kenneth Pace August 08, 1950 [id number] Called in order to [facility] for an [place]  Patient ID: BEULAH MATUSEK, male   DOB: 08-29-1950, 70 y.o.   MRN: [id number]   Janit Pagan 03/26/2020, 1:20 PM

## 2020-03-26 NOTE — Transfer of Care (Signed)
Immediate Anesthesia Transfer of Care Note  Patient: Kenneth Pace  Procedure(s) Performed: POSTERIOR LUMBAR INTERBODY FUSION LUMBAR FOUR-FIVE. (N/A Spine Lumbar)  Patient Location: PACU  Anesthesia Type:General  Level of Consciousness: oriented, drowsy, patient cooperative and responds to stimulation  Airway & Oxygen Therapy: Patient Spontanous Breathing and Patient connected to nasal cannula oxygen  Post-op Assessment: Report given to RN, Post -op Vital signs reviewed and stable and Patient moving all extremities X 4  Post vital signs: Reviewed and stable  Last Vitals:  Vitals Value Taken Time  BP 112/45 03/26/20 1134  Temp    Pulse 81 03/26/20 1137  Resp 20 03/26/20 1137  SpO2 96 % 03/26/20 1137  Vitals shown include unvalidated device data.  Last Pain:  Vitals:   03/26/20 0630  TempSrc: Oral  PainSc: 2       Patients Stated Pain Goal: 2 (56/70/14 1030)  Complications: No complications documented.

## 2020-03-26 NOTE — Progress Notes (Signed)
Inpatient Diabetes Program Recommendations  AACE/ADA: New Consensus Statement on Inpatient Glycemic Control (2015)  Target Ranges:  Prepandial:   less than 140 mg/dL      Peak postprandial:   less than 180 mg/dL (1-2 hours)      Critically ill patients:  140 - 180 mg/dL   Lab Results  Component Value Date   GLUCAP 236 (H) 03/26/2020   HGBA1C 8.1 (A) 01/24/2020    Review of Glycemic Control Results for Kenneth Pace, Kenneth Pace" (MRN [id number]) as of 03/26/2020 12:07  Ref. Range 03/26/2020 06:34 03/26/2020 11:35  Glucose-Capillary Latest Ref Range: 70 - 99 mg/dL 149 (H) 236 (H)   Diabetes history: DM 1 per history Outpatient Diabetes medications:  670 G insulin pump:  6a-10p- 4.5 units/hr 10p-6a- 2.6 units/hr Covers 1 unit for every 7 grams of CHO and 1 unit for every 50 mg/dL>100 mg/dL Current orders for Inpatient glycemic control:  IV insulin   Inpatient Diabetes Program Recommendations:    Spoke with anesthesiologist, Dr. Gifford Shave at patients bedside.  Plan is to resume insulin pump and overlap with IV insulin by one hour.  Patient states he does have hypoglycemia at home at times.  Note that current insulin pump settings higher from 6a-10p however patient does not have glucose sensor due to insurance issues.  Therefore recommend reducing insulin pump basal rates while in the hospital, since patient will likely not be eating as he does at home.    Patient was able to reconnect insulin pump himself and also adjusted basal rates to Basal pattern#2 which reduced his basal rates to 2.75 units/24 hours (which provides 66 units/24 hours)- He is alert, oriented and able to manage insulin pump.  Will follow.  RN to turn off IV insulin in one hour.    Thanks,  Adah Perl, RN, BC-ADM Inpatient Diabetes Coordinator Pager [phone] (8a-5p)

## 2020-03-26 NOTE — Anesthesia Procedure Notes (Signed)
Procedure Name: Intubation Date/Time: 03/26/2020 7:54 AM Performed by: Claris Che, CRNA Pre-anesthesia Checklist: Patient identified, Emergency Drugs available, Suction available, Patient being monitored and Timeout performed Patient Re-evaluated:Patient Re-evaluated prior to induction Oxygen Delivery Method: Circle system utilized Preoxygenation: Pre-oxygenation with 100% oxygen Induction Type: IV induction Ventilation: Mask ventilation without difficulty Laryngoscope Size: Mac and 4 Grade View: Grade II Tube type: Oral Tube size: 8.0 mm Number of attempts: 1 Airway Equipment and Method: Stylet Placement Confirmation: ETT inserted through vocal cords under direct vision,  positive ETCO2 and breath sounds checked- equal and bilateral Secured at: 23 cm Tube secured with: Tape Dental Injury: Teeth and Oropharynx as per pre-operative assessment

## 2020-03-26 NOTE — Anesthesia Postprocedure Evaluation (Signed)
Anesthesia Post Note  Patient: Kenneth Pace  Procedure(s) Performed: POSTERIOR LUMBAR INTERBODY FUSION LUMBAR FOUR-FIVE. (N/A Spine Lumbar)     Patient location during evaluation: PACU Anesthesia Type: General Level of consciousness: awake and alert Pain management: pain level controlled Vital Signs Assessment: post-procedure vital signs reviewed and stable Respiratory status: spontaneous breathing, nonlabored ventilation, respiratory function stable and patient connected to nasal cannula oxygen Cardiovascular status: blood pressure returned to baseline and stable Postop Assessment: no apparent nausea or vomiting Anesthetic complications: no   No complications documented.  Last Vitals:  Vitals:   03/26/20 1337 03/26/20 1416  BP: 120/65 134/77  Pulse: 88 (!) 57  Resp: 18 20  Temp:  (!) 36.4 C  SpO2: 96% 95%    Last Pain:  Vitals:   03/26/20 1416  TempSrc: Oral  PainSc:                  Catalina Gravel

## 2020-03-27 DIAGNOSIS — M4727 Other spondylosis with radiculopathy, lumbosacral region: Secondary | ICD-10-CM | POA: Diagnosis not present

## 2020-03-27 LAB — BASIC METABOLIC PANEL
Anion gap: 10 (ref 5–15)
BUN: 20 mg/dL (ref 8–23)
CO2: 26 mmol/L (ref 22–32)
Calcium: 8.5 mg/dL — ABNORMAL LOW (ref 8.9–10.3)
Chloride: 98 mmol/L (ref 98–111)
Creatinine, Ser: 1 mg/dL (ref 0.61–1.24)
GFR, Estimated: >60 mL/min (ref >60)
Glucose, Bld: 255 mg/dL — ABNORMAL HIGH (ref 70–99)
Potassium: 3.9 mmol/L (ref 3.5–5.1)
Sodium: 134 mmol/L — ABNORMAL LOW (ref 135–145)

## 2020-03-27 LAB — CBC
HCT: 35.1 % — ABNORMAL LOW (ref 39.0–52.0)
Hemoglobin: 12.2 g/dL — ABNORMAL LOW (ref 13.0–17.0)
MCH: 30.2 pg (ref 26.0–34.0)
MCHC: 34.8 g/dL (ref 30.0–36.0)
MCV: 86.9 fL (ref 80.0–100.0)
Platelets: 279 10*3/uL (ref 150–400)
RBC: 4.04 MIL/uL — ABNORMAL LOW (ref 4.22–5.81)
RDW: 12.7 % (ref 11.5–15.5)
WBC: 12.5 10*3/uL — ABNORMAL HIGH (ref 4.0–10.5)
nRBC: 0 % (ref 0.0–0.2)

## 2020-03-27 LAB — GLUCOSE, CAPILLARY
Glucose-Capillary: 219 mg/dL — ABNORMAL HIGH (ref 70–99)
Glucose-Capillary: 353 mg/dL — ABNORMAL HIGH (ref 70–99)

## 2020-03-27 MED ORDER — ASPIRIN EC 81 MG PO TBEC: 81.0000 mg | DELAYED_RELEASE_TABLET | Freq: Every day | ORAL | 11 refills | Status: DC

## 2020-03-27 MED ORDER — OXYCODONE HCL 5 MG PO TABS: 5.0000 mg | ORAL_TABLET | ORAL | 0 refills | Status: DC | PRN

## 2020-03-27 NOTE — Evaluation (Signed)
Physical Therapy Evaluation Patient Details Name: Kenneth Pace MRN: [id number] DOB: October 28, 1950 Today's Date: 03/27/2020   History of Present Illness  Pt is a 70 y/o male who presents s/p L4-L5 PLIF on 03/26/2020. PMH significant for HTN, type 1 DM, chronic inflammatory demyelinating polyneuropathy, laminectomy/decompression 05/22/19 redo 02/01/20, x5 knee arthroscopies (2 L, 3 R), ACDF 2018.    Clinical Impression  Pt admitted with above diagnosis. At the time of PT eval, pt was able to demonstrate transfers and ambulation with gross supervision for safety and RW for support. Pt was educated on precautions, brace application/wearing schedule, appropriate activity progression, and car transfer. Pt currently with functional limitations due to the deficits listed below (see PT Problem List). Pt will benefit from skilled PT to increase their independence and safety with mobility to allow discharge to the venue listed below.      Follow Up Recommendations Home health PT;Supervision for mobility/OOB    Equipment Recommendations  Rolling walker with 5" wheels    Recommendations for Other Services       Precautions / Restrictions Precautions Precautions: Fall;Back Precaution Booklet Issued: Yes (comment) Precaution Comments: Reviewed handout and pt was cued for precautions during functional mobility. Required Braces or Orthoses: Spinal Brace Spinal Brace: Lumbar corset;Applied in sitting position Restrictions Weight Bearing Restrictions: No      Mobility  Bed Mobility Overal bed mobility: Needs Assistance Bed Mobility: Rolling;Sidelying to Sit Rolling: Supervision Sidelying to sit: Supervision       General bed mobility comments: VC's for sequencing for optimal log roll technique.    Transfers Overall transfer level: Needs assistance Equipment used: Rolling walker (2 wheeled) Transfers: Sit to/from Stand Sit to Stand: Supervision         General transfer comment: Close  supervision for safety to power-up to full stand. VC's for hand placement on seated surface for safety.  Ambulation/Gait Ambulation/Gait assistance: Supervision Gait Distance (Feet): 300 Feet Assistive device: Rolling walker (2 wheeled) Gait Pattern/deviations: Step-through pattern;Decreased stride length;Trunk flexed Gait velocity: Decreased Gait velocity interpretation: <1.31 ft/sec, indicative of household ambulator General Gait Details: VC's for improved posture, closer walker proximity and forward gaze. No overt LOB noted throughout however pt appeared mildly unsteady at times.  Stairs Stairs: Yes Stairs assistance: Min guard Stair Management: One rail Left;Step to pattern;Forwards Number of Stairs: 10 General stair comments: VC's for sequencing and general safety with stair negotiation. No assist required however hands on guarding provided for safety.  Wheelchair Mobility    Modified Rankin (Stroke Patients Only)       Balance Overall balance assessment: Needs assistance Sitting-balance support: No upper extremity supported;Feet supported Sitting balance-Leahy Scale: Fair     Standing balance support: No upper extremity supported;During functional activity Standing balance-Leahy Scale: Poor Standing balance comment: SPC used to mobilize around the bed to the chair and pt was notably more unsteady.                             Pertinent Vitals/Pain Pain Assessment: 0-10 Pain Score: 6  Pain Location: Back Pain Descriptors / Indicators: Operative site guarding;Sore Pain Intervention(s): Limited activity within patient's tolerance;Monitored during session;Repositioned    Home Living Family/patient expects to be discharged to:: Private residence Living Arrangements: Spouse/significant other Available Help at Discharge: Family;Available 24 hours/day Type of Home: House Home Access: Stairs to enter Entrance Stairs-Rails: Left;Right;Can reach both Entrance  Stairs-Number of Steps: 1 with rails in garage, 3 with rails at front  Home Layout: Two level Home Equipment: Cane - single point;Shower seat;Grab bars - toilet;Grab bars - tub/shower;Hand held shower head;Adaptive equipment;Walker - 4 wheels;Toilet riser;Wheelchair - manual      Prior Function Level of Independence: Independent with assistive device(s)         Comments: SPC use     Hand Dominance   Dominant Hand: Right    Extremity/Trunk Assessment   Upper Extremity Assessment Upper Extremity Assessment: Defer to OT evaluation    Lower Extremity Assessment Lower Extremity Assessment: Generalized weakness (Consistent with pre-op diagnosis)    Cervical / Trunk Assessment Cervical / Trunk Assessment: Other exceptions Cervical / Trunk Exceptions: s/p surgery  Communication   Communication: No difficulties  Cognition Arousal/Alertness: Awake/alert Behavior During Therapy: WFL for tasks assessed/performed Overall Cognitive Status: Within Functional Limits for tasks assessed                                        General Comments      Exercises     Assessment/Plan    PT Assessment Patient needs continued PT services  PT Problem List Decreased strength;Decreased activity tolerance;Decreased balance;Decreased mobility;Decreased knowledge of use of DME;Decreased safety awareness;Decreased knowledge of precautions;Pain       PT Treatment Interventions DME instruction;Gait training;Stair training;Functional mobility training;Therapeutic activities;Therapeutic exercise;Neuromuscular re-education    PT Goals (Current goals can be found in the Care Plan section)  Acute Rehab PT Goals Patient Stated Goal: Home today PT Goal Formulation: With patient Time For Goal Achievement: 04/03/20 Potential to Achieve Goals: Good    Frequency Min 5X/week   Barriers to discharge        Co-evaluation               AM-PAC PT "6 Clicks" Mobility  Outcome  Measure Help needed turning from your back to your side while in a flat bed without using bedrails?: None Help needed moving from lying on your back to sitting on the side of a flat bed without using bedrails?: A Little Help needed moving to and from a bed to a chair (including a wheelchair)?: A Little Help needed standing up from a chair using your arms (e.g., wheelchair or bedside chair)?: A Little Help needed to walk in hospital room?: A Little Help needed climbing 3-5 steps with a railing? : A Little 6 Click Score: 19    End of Session Equipment Utilized During Treatment: Gait belt;Back brace Activity Tolerance: Patient tolerated treatment well Patient left: in chair;with call bell/phone within reach Nurse Communication: Mobility status PT Visit Diagnosis: Unsteadiness on feet (R26.81);Pain Pain - part of body:  (back)    Time: 4709-6283 PT Time Calculation (min) (ACUTE ONLY): 42 min   Charges:   PT Evaluation $PT Eval Moderate Complexity: 1 Mod PT Treatments $Gait Training: 23-37 mins        Rolinda Roan, PT, DPT Acute Rehabilitation Services Pager: [phone] Office: [phone]   Thelma Comp 03/27/2020, 10:12 AM

## 2020-03-27 NOTE — Discharge Summary (Signed)
Physician Discharge Summary  Patient ID: Kenneth Pace MRN: [id number] DOB/AGE: 1950/02/28 70 y.o.  Admit date: 03/26/2020 Discharge date: 03/27/2020  Admission Diagnoses:  Lumbar radiculopathy Mechanical back pain  Discharge Diagnoses:  Same Active Problems:   Lumbar radiculopathy   Discharged Condition: Stable  Hospital Course:  Kenneth Pace is a 70 y.o. male who was admitted for the below procedure. There were no post operative complications. At time of discharge, pain was well controlled, ambulating with Pt/OT, tolerating po, voiding normal. Ready for discharge.  Treatments: Surgery - L4-5 PLIF  Discharge Exam: Blood pressure (!) 115/58, pulse 94, temperature 98.1 F (36.7 C), temperature source Oral, resp. rate 18, height 6' (1.829 m), weight 105.9 kg, SpO2 98 %. Awake, alert, oriented Speech fluent, appropriate CN grossly intact 5/5 BUE/BLE Wound c/d/i  Disposition:   Discharge Instructions    Call MD for:  difficulty breathing, headache or visual disturbances   Complete by: As directed    Call MD for:  persistant dizziness or light-headedness   Complete by: As directed    Call MD for:  redness, tenderness, or signs of infection (pain, swelling, redness, odor or green/yellow discharge around incision site)   Complete by: As directed    Call MD for:  severe uncontrolled pain   Complete by: As directed    Call MD for:  temperature >100.4   Complete by: As directed    Diet - low sodium heart healthy   Complete by: As directed    Driving Restrictions   Complete by: As directed    Do not drive until given clearance.   Increase activity slowly   Complete by: As directed    Lifting restrictions   Complete by: As directed    Do not lift anything >10lbs. Avoid bending and twisting in awkward positions. Avoid bending at the back.   May shower / Bathe   Complete by: As directed    In 24 hours. Okay to wash wound with warm soapy water. Avoid scrubbing  the wound. Pat dry.   Remove dressing in 48 hours   Complete by: As directed      Allergies as of 03/27/2020   No Known Allergies     Medication List    TAKE these medications   acetaminophen 500 MG tablet Commonly known as: TYLENOL Take 500 mg by mouth every 6 (six) hours as needed for mild pain.   amitriptyline 75 MG tablet Commonly known as: ELAVIL TAKE 1 TABLET(75 MG) BY MOUTH AT BEDTIME What changed: See the new instructions.   aspirin EC 81 MG tablet Take 1 tablet (81 mg total) by mouth daily. Swallow whole. Start taking on: April 01, 2020 What changed: These instructions start on April 01, 2020. If you are unsure what to do until then, ask your doctor or other care provider.   B-COMPLEX/B-12 PO Take 1 tablet by mouth at bedtime.   baclofen 10 MG tablet Commonly known as: LIORESAL Take 2 tablets (20 mg total) by mouth 3 (three) times daily. What changed: how much to take   celecoxib 100 MG capsule Commonly known as: CELEBREX TAKE 1 CAPSULE(100 MG) BY MOUTH AT BEDTIME What changed:   how much to take  how to take this  when to take this  additional instructions   clotrimazole-betamethasone cream Commonly known as: LOTRISONE Apply 1 application topically 2 (two) times daily. What changed:   when to take this  reasons to take this   Cranberry 400 MG Tabs  Take 400 mg by mouth at bedtime.   DULoxetine 60 MG capsule Commonly known as: CYMBALTA TAKE 1 CAPSULE DAILY What changed: when to take this   finasteride 5 MG tablet Commonly known as: PROSCAR Take 1 tablet (5 mg total) by mouth at bedtime.   glucagon 1 MG injection Inject 1 mg into the vein once as needed. What changed: reasons to take this   glucose blood test strip Commonly known as: Visual merchandiser Next Test MEDICALLY NECESSARY FOR USE WITH PUMP; Use to check blood sugar 5 times per day and prn; E11.42   Immune Globulin 10% 10G/148mL (10,000mg /121mL) Soln Generic drug: Immune  Globulin 10% Inject 105 g into the vein every 6 (six) weeks.   insulin lispro 100 UNIT/ML injection Commonly known as: HumaLOG INJECT 120 UNITS DAILY IN PUMP   insulin pump Soln Inject 120 each into the skin daily. HUMALOG   lidocaine 5 % Commonly known as: LIDODERM At 7 am and remove at 7 pm. Apply 1 on each side. What changed:   how much to take  how to take this  when to take this  reasons to take this  additional instructions   lisinopril-hydrochlorothiazide 10-12.5 MG tablet Commonly known as: ZESTORETIC Take 1 tablet by mouth at bedtime.   methocarbamol 750 MG tablet Commonly known as: Robaxin-750 Take 1 tablet (750 mg total) by mouth 3 (three) times daily as needed for muscle spasms.   multivitamin capsule Take 1 capsule by mouth at bedtime.   oxyCODONE 5 MG immediate release tablet Commonly known as: Roxicodone Take 1-2 tablets (5-10 mg total) by mouth every 4 (four) hours as needed.   oxyCODONE-acetaminophen 7.5-325 MG tablet Commonly known as: Percocet Take 1 tablet by mouth every 4 (four) hours as needed for severe pain.   PARADIGM RESERVOIR 3ML Misc 1 Device by Does not apply route every 3 (three) days.   Quick-set Infusion 43" 61mm Misc 1 Device by Does not apply route every 3 (three) days.   pseudoephedrine 30 MG tablet Commonly known as: SUDAFED Take 30 mg by mouth at bedtime as needed for congestion.   scopolamine 1 MG/3DAYS Commonly known as: TRANSDERM-SCOP Place 1 patch (1.5 mg total) onto the skin every 3 (three) days. What changed: additional instructions   traMADol 50 MG tablet Commonly known as: ULTRAM Take 50 mg by mouth every 6 (six) hours as needed (mild pain).        SignedVista Mink Jesusmanuel Erbes 03/27/2020, 8:06 AM

## 2020-03-27 NOTE — TOC Initial Note (Signed)
Transition of Care [facility]) - Initial/Assessment Note    Patient Details  Name: Kenneth Pace MRN: [id number] Date of Birth: [birth year]  Transition of Care [facility] CM/SW Contact:    Joanne Chars, LCSW Phone Number: 03/27/2020, 2:17 PM  Clinical Narrative: Pt declines referral for [facility], would like to do outpt PT at [facility], as he has been there before.  Pt walker was delivered by [facility] staff.    Outpt PT order completed by Marta Lamas, RN/Case manager.                  Expected Discharge Plan: Home/Self Care Barriers to Discharge: No Barriers Identified   Patient Goals and CMS Choice Patient states their goals for this hospitalization and ongoing recovery are:: "walk, be self sufficient" CMS Medicare.gov Compare Post Acute Care list provided to::  (na)    Expected Discharge Plan and Services Expected Discharge Plan: Home/Self Care In-house Referral: Clinical Social Work   Post Acute Care Choice: Durable Medical Equipment Living arrangements for the past 2 months: Single Family Home Expected Discharge Date: 03/27/20               DME Arranged: N/A (DME handled by [facility])         HH Arranged: Refused HH          Prior Living Arrangements/Services Living arrangements for the past 2 months: Single Family Home Lives with:: Spouse Patient language and need for interpreter reviewed:: Yes Do you feel safe going back to the place where you live?: Yes      Need for Family Participation in Patient Care: No (Comment) Care giver support system in place?: Yes (comment) Current home services: Other (comment) (none) Criminal Activity/Legal Involvement Pertinent to Current Situation/Hospitalization: No - Comment as needed  Activities of Daily Living      Permission Sought/Granted Permission sought to share information with : Family Supports Permission granted to share information with : Yes, Verbal Permission Granted  Share Information with NAME: wife Alyse Low            Emotional Assessment Appearance:: Appears stated age Attitude/Demeanor/Rapport: Engaged Affect (typically observed): Appropriate,Pleasant Orientation: : Oriented to Self,Oriented to Place,Oriented to  Time,Oriented to Situation Alcohol / Substance Use: Not Applicable Psych Involvement: No (comment)  Admission diagnosis:  Lumbar radiculopathy [M54.16] Patient Active Problem List   Diagnosis Date Noted  . Lumbar herniated disc 02/01/2020  . Lumbar radiculopathy 05/22/2019  . Spastic gait 03/07/2019  . Myalgia 11/01/2018  . Chronic bilateral low back pain without sciatica 07/21/2018  . 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 CIDP (chronic inflammatory demyelinating polyneuropathy) ([place]) 12/30/2017  . Edema 03/01/2017  . Hypokalemia   . Slow transit constipation   . Steroid-induced hyperglycemia   . Urinary retention   . Accidental drug overdose   . Postoperative pain   . Hyponatremia   . Cervical myelopathy ([place]) 02/01/2017  . Shortness of breath at rest   . Hypoglycemia   . Spondylosis, cervical, with myelopathy   . Neuropathic pain   . Benign essential HTN   . Labile blood glucose   . Muscle spasm   . GAD (generalized anxiety disorder)   . Acute inflammatory demyelinating polyneuropathy ([place]) 01/11/2017  . Anxiety state   . Neurogenic bladder   . Depression 12/30/2016  . Leg weakness, bilateral 12/30/2016  . 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[place]) 12/30/2016  . GBS (Guillain Barre syndrome) ([place])   . AIDP (acute inflammatory demyelinating polyneuropathy) ([place]) 11/27/2016  . Weakness  11/20/2016  . Weakness of both arms 10/30/2016  . 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 Obstructive sleep apnea 03/24/2016  . Numbness 03/18/2016  . Mild nonproliferative diabetic retinopathy of both eyes without macular edema associated with type 2 diabetes mellitus ([place]) 05/24/2015  . Nuclear cataract of both eyes 05/24/2015  . Diabetes  mellitus without complication ([place]) 27/51/7001  . 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 Eustachian tube dysfunction 02/12/2015  . Acute maxillary sinusitis 02/12/2015  . Wellness examination 08/22/2014  . Alkaline phosphatase elevation 08/22/2014  . 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 Benign non-nodular prostatic hyperplasia with lower urinary tract symptoms 01/31/2014  . Lesion of eyelid 09/26/2013  . Screening for prostate cancer 04/24/2013  . Diabetes ([place]) 06/16/2012  . ED (erectile dysfunction) of organic origin 06/03/2012  . Routine general medical examination at a health care facility 04/10/2012  . BPH (benign prostatic hyperplasia) 02/03/2010  . HEMOCCULT POSITIVE STOOL 05/25/2008  . ELBOW PAIN, RIGHT 07/04/2007  . Dyslipidemia 02/16/2007  . ANXIETY 02/16/2007  . ERECTILE DYSFUNCTION 02/16/2007  . Essential hypertension 02/16/2007  . TESTICULAR MASS, LEFT 02/16/2007  . KNEE PAIN, CHRONIC 02/16/2007  . ADENOIDECTOMY, HX OF 02/16/2007   PCP:  Shelda Pal, DO Pharmacy:   [place], [address] [place] [address] Phone: [phone] Fax: [place], [address], [address] [place], [address] [place] [address] Phone: [phone] Fax: [phone]     Social Determinants of Health (SDOH) Interventions    Readmission Risk Interventions Readmission Risk Prevention Plan 02/02/2020  Post Dischage Appt Complete  Medication Screening Complete  Transportation Screening Complete  Some recent data might be hidden

## 2020-03-27 NOTE — Evaluation (Signed)
Occupational Therapy Evaluation Patient Details Name: Kenneth Pace MRN: [id number] DOB: [birth year] Today's Date: 03/27/2020    History of Present Illness Pt is a 70 y/o male who presents s/p L4-L5 PLIF on 03/26/2020. PMH significant for HTN, type 1 DM, chronic inflammatory demyelinating polyneuropathy, laminectomy/decompression 05/22/19 redo 02/01/20, x5 knee arthroscopies (2 L, 3 R), ACDF 2018.   Clinical Impression   Patient admitted for the procedure above.  He is close to his baseline for functional mobility/toileting and ADL from a sit/stand level.  Good follow through in all education and training.  All questions answered, and patient will not need any post acute OT.  Discharging home today.      Follow Up Recommendations  No OT follow up    Equipment Recommendations  None recommended by OT    Recommendations for Other Services       Precautions / Restrictions Precautions Precautions: Fall;Back Precaution Booklet Issued: Yes (comment) Precaution Comments: Reviewed handout and pt was cued for precautions during functional mobility. Required Braces or Orthoses: Spinal Brace Spinal Brace: Lumbar corset;Applied in sitting position Restrictions Weight Bearing Restrictions: No      Mobility Bed Mobility Overal bed mobility: Modified Independent Bed Mobility: Rolling;Sidelying to Sit Rolling: Supervision Sidelying to sit: Supervision       General bed mobility comments: VC's for sequencing for optimal log roll technique.    Transfers Overall transfer level: Needs assistance Equipment used: Rolling walker (2 wheeled) Transfers: Sit to/from Stand Sit to Stand: Supervision         General transfer comment: Close supervision for safety to power-up to full stand. VC's for hand placement on seated surface for safety.    Balance Overall balance assessment: Needs assistance Sitting-balance support: No upper extremity supported;Feet supported Sitting balance-Leahy  Scale: Good     Standing balance support: Single extremity supported Standing balance-Leahy Scale: Fair Standing balance comment: SPC used to mobilize around the bed to the chair and pt was notably more unsteady.                           ADL either performed or assessed with clinical judgement   ADL Overall ADL's : At baseline                                       General ADL Comments: Patient moving a little more slowly than normal due to pain, but overall close to baseline.  Will have assist as needed.     Vision Baseline Vision/History: Wears glasses Wears Glasses: At all times Patient Visual Report: No change from baseline       Perception     Praxis      Pertinent Vitals/Pain Pain Assessment: Faces Pain Score: 6  Faces Pain Scale: Hurts even more Pain Location: Back Pain Descriptors / Indicators: Operative site guarding;Grimacing Pain Intervention(s): Monitored during session     Hand Dominance Right   Extremity/Trunk Assessment Upper Extremity Assessment Upper Extremity Assessment: Overall WFL for tasks assessed   Lower Extremity Assessment Lower Extremity Assessment: Defer to PT evaluation   Cervical / Trunk Assessment Cervical / Trunk Assessment: Other exceptions Cervical / Trunk Exceptions: s/p surgery   Communication Communication Communication: No difficulties   Cognition Arousal/Alertness: Awake/alert Behavior During Therapy: WFL for tasks assessed/performed Overall Cognitive Status: Within Functional Limits for tasks assessed  Home Living Family/patient expects to be discharged to:: Private residence Living Arrangements: Spouse/significant other Available Help at Discharge: Family;Available 24 hours/day Type of Home: House Home Access: Stairs to enter [employer] of Steps: 1 with rails in garage, 3 with rails at front Entrance  Stairs-Rails: Left;Right;Can reach both Home Layout: Two level Alternate Level Stairs-Number of Steps: 17 (flight) Alternate Level Stairs-Rails: Can reach both Bathroom Shower/Tub: Tub/shower unit;Curtain   Bathroom Toilet: Standard Bathroom Accessibility: No   Home Equipment: Cane - single point;Shower seat;Grab bars - toilet;Grab bars - tub/shower;Hand held shower head;Adaptive equipment;Walker - 4 wheels;Toilet riser;Wheelchair - Higher education careers adviser: Reacher;Long-handled sponge        Prior Functioning/Environment Level of Independence: Independent with assistive device(s)        Comments: SPC use        OT Problem List: Pain;Impaired balance (sitting and/or standing)      OT Treatment/Interventions:      OT Goals(Current goals can be found in the care plan section) Acute Rehab OT Goals Patient Stated Goal: Waiting to return home OT Goal Formulation: With patient Time For Goal Achievement: 03/27/20 Potential to Achieve Goals: Good  OT Frequency:     Barriers to D/C:  none noted          Co-evaluation              AM-PAC OT "6 Clicks" Daily Activity     Outcome Measure Help from another person eating meals?: None Help from another person taking care of personal grooming?: None Help from another person toileting, which includes using toliet, bedpan, or urinal?: None Help from another person bathing (including washing, rinsing, drying)?: None Help from another person to put on and taking off regular upper body clothing?: None Help from another person to put on and taking off regular lower body clothing?: None 6 Click Score: 24   End of Session Equipment Utilized During Treatment: Back brace  Activity Tolerance: Patient tolerated treatment well Patient left: in bed;with call bell/phone within reach  OT Visit Diagnosis: Unsteadiness on feet (R26.81);Pain                Time: 1657-9038 OT Time Calculation (min): 17 min Charges:  OT General  Charges $OT Visit: 1 Visit OT Evaluation $OT Eval Moderate Complexity: 1 Mod  03/27/2020  Rich, OTR/L  Acute Rehabilitation Services  Office:  [facility] 03/27/2020, 11:19 AM

## 2020-03-27 NOTE — Care Management Obs Status (Signed)
[place] NOTIFICATION   Patient Details  Name: Kenneth Pace MRN: [id number] Date of Birth: October 11, 1950   Medicare Observation Status Notification Given:  Yes    Joanne Chars, LCSW 03/27/2020, 8:49 AM

## 2020-03-27 NOTE — Plan of Care (Signed)
Patient discharged home with spouse at bedside. Patient given discharged instructions. Spouse stated understanding and all belongings with spouse at discharged.

## 2020-03-27 NOTE — Progress Notes (Signed)
  NEUROSURGERY PROGRESS NOTE   No issues overnight.  Complains of appropriate back soreness No new N/T/W in extremities Tolerating po. Voiding normal  EXAM:  BP (!) 115/58 (BP Location: Left Arm)   Pulse 94   Temp 98.1 F (36.7 C) (Oral)   Resp 18   Ht 6' (1.829 m)   Wt 105.9 kg   SpO2 98%   BMI 31.65 kg/m   Awake, alert, oriented  Speech fluent, appropriate  CN grossly intact  5/5 BUE/BLE  Incision: c/d/i  IMPRESSION/PLAN 70 y.o. male POD1 L4-5 fusion. Doing well. - Therapy this am - should d/c later this am

## 2020-03-27 NOTE — Care Management CC44 (Signed)
Condition Code 44 Documentation Completed  Patient Details  Name: Kenneth Pace MRN: [id number] Date of Birth: 1950-04-12   Condition Code 44 given:  Yes Patient signature on Condition Code 44 notice:  Yes Documentation of 2 MD's agreement:  Yes Code 44 added to claim:  Yes    Joanne Chars, LCSW 03/27/2020, 8:49 AM

## 2020-03-28 ENCOUNTER — Ambulatory Visit: Payer: BC Managed Care – PPO | Admitting: Endocrinology

## 2020-03-29 ENCOUNTER — Other Ambulatory Visit: Payer: Self-pay | Admitting: Physical Medicine & Rehabilitation

## 2020-04-04 ENCOUNTER — Ambulatory Visit (INDEPENDENT_AMBULATORY_CARE_PROVIDER_SITE_OTHER): Payer: Medicare Other | Admitting: Rehabilitative and Restorative Service Providers"

## 2020-04-04 ENCOUNTER — Encounter: Payer: Self-pay | Admitting: Rehabilitative and Restorative Service Providers"

## 2020-04-04 ENCOUNTER — Other Ambulatory Visit: Payer: Self-pay

## 2020-04-04 DIAGNOSIS — R2689 Other abnormalities of gait and mobility: Secondary | ICD-10-CM | POA: Diagnosis not present

## 2020-04-04 DIAGNOSIS — M6281 Muscle weakness (generalized): Secondary | ICD-10-CM

## 2020-04-04 DIAGNOSIS — M5440 Lumbago with sciatica, unspecified side: Secondary | ICD-10-CM

## 2020-04-04 NOTE — Patient Instructions (Signed)
Provided written handout  Sit to stand Standing balance and alignment Shallow knee bends Wt shift fwd/back Wt shift side to side Deep breathing 4 or 5 count in/hold/out Tighten core 5-10 sec hold x 5-10 reps

## 2020-04-04 NOTE — Therapy (Signed)
[facility] [place], [place], [address] Phone: [phone]   Fax:  [phone]  Physical Therapy Evaluation  Patient Details  Name: Kenneth Pace MRN: [id number] Date of Birth: 01/09/1951 Referring Provider (PT): Consuella Lose, MD   Encounter Date: 04/04/2020   PT End of Session - 04/04/20 1104    Visit Number 1    Number of Visits 12    Date for PT Re-Evaluation 05/16/20    PT Start Time 1105    PT Stop Time 1145    PT Time Calculation (min) 40 min    Equipment Utilized During Treatment Gait belt;Back brace    Activity Tolerance Patient tolerated treatment well           Past Medical History:  Diagnosis Date  . ADENOIDECTOMY, HX OF 02/16/2007  . Anxiety    pt. states he does not have anxiety.  Marland Kitchen BENIGN PROSTATIC HYPERTROPHY, WITH OBSTRUCTION 02/03/2010  . 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 DIABETES MELLITUS, TYPE I, UNCONTROLLED 12/29/2006  . ERECTILE DYSFUNCTION 02/16/2007  . Hearing loss    wears hearing aids  . HYPERLIPIDEMIA 02/16/2007  . HYPERTENSION 02/16/2007  . Nerve pain    per patient "on lower back and both legs"  . Sleep apnea    uses CPAP  . TESTICULAR MASS, LEFT 02/16/2007    Past Surgical History:  Procedure Laterality Date  . ANTERIOR CERVICAL DECOMP/DISCECTOMY FUSION N/A 02/01/2017   Procedure: ANTERIOR CERVICAL DECOMPRESSION/DISCECTOMY FUSION CERVICAL THREE-FOUR , CERVICAL FOUR-FIVE  CERVICAL FIVE-SIX;  Surgeon: Consuella Lose, MD;  Location: [facility];  Service: Neurosurgery;  Laterality: N/A;  . APPENDECTOMY    . BACK SURGERY    . COLONOSCOPY  02/05/2010   [facility]  . EYE SURGERY Right    Dr. Katy Fitch  . KNEE SURGERY     2 Lknee arthroscopy, 3 R knee arthroscpoy  . KNEE SURGERY Right 11/12/2016  . LUMBAR LAMINECTOMY/DECOMPRESSION MICRODISCECTOMY Right 05/22/2019   Procedure: LAMINOTOMY AND MICRODISCECTOMY RIGHT  LUMBAR FOUR- LUMBAR FIVE;  Surgeon: Consuella Lose, MD;  Location: [facility];  Service: Neurosurgery;  Laterality: Right;  LAMINOTOMY AND MICRODISCECTOMY RIGHT LUMBAR FOUR- LUMBAR FIVE  . LUMBAR LAMINECTOMY/DECOMPRESSION MICRODISCECTOMY Right 02/01/2020   Procedure: REDO MICRODISCECTOMY RT LUMBAR FOUR-FIVE;  Surgeon: Consuella Lose, MD;  Location: [facility];  Service: Neurosurgery;  Laterality: Right;  posterior  . TONSILLECTOMY      There were no vitals filed for this visit.    Subjective Assessment - 04/04/20 1110    Subjective Patient reports continued pain following surgery 4/21 with second surgery 12/16/21with no change in symtpoms - pain continued to increase. Patient underwent lumbar fusion L4/5 on 03/26/20. He now has difficulty with B/Bl function and continued pain and weakness.    Pertinent History laminectomy april 2021 and again 12/21, 2018 cervical decompression    Patient Stated Goals improve strength, be able to get up and down better, community exercise    Currently in Pain? Yes    Pain Score 7    after pain medication   Pain Location Back    Pain Orientation Lower    Pain Descriptors / Indicators Dull;Stabbing   stabbing with movement   Pain Type Surgical pain    Pain Onset More than a month ago    Pain Frequency Constant    Aggravating Factors  changing positions    Pain Relieving Factors minor improvement with medication; rest/stop moving    Effect of  Pain on Daily Activities when moving sit to stand pain 8-10/10 pain              [facility] PT Assessment - 04/04/20 0001      Assessment   Medical Diagnosis L4-L5 discectomy/lumbar fusion    Referring Provider (PT) Consuella Lose, MD    Onset Date/Surgical Date 03/26/20    Hand Dominance Right      Restrictions   Weight Bearing Restrictions No      Balance Screen   Has the patient fallen in the past 6 months Yes    How many times? 6-8 times    Has the patient had a decrease in activity level because of a fear  of falling?  Yes    Is the patient reluctant to leave their home because of a fear of falling?  Yes      [place] Private residence    Living Arrangements Spouse/significant other    Available Help at Discharge Family    Type of [place] to enter    Home Layout Two level;Bed/bath upstairs      Prior Function   Level of [place] device for independence;Needs assistance with ADLs;Needs assistance with homemaking;Needs assistance with gait      Observation/Other Assessments   Focus on Therapeutic Outcomes (FOTO)  FS measure 1      Sensation   Light Touch Impaired Detail    Light Touch Impaired Details Impaired RLE      Posture/Postural Control   Posture Comments head forward; flexed forward at hips      AROM   Overall AROM Comments limited mobility through trunk(NT due to lumbar fusion) and bilat LE's      Strength   Overall Strength Comments Strength assessment limited by pain and inability to change positions    Right Hip Flexion 2+/5    Left Hip Flexion 2+/5    Right Knee Extension 3-/5    Left Knee Extension 3-/5    Right Ankle Dorsiflexion 3/5    Left Ankle Dorsiflexion 3/5      Flexibility   Hamstrings assessed in sitting 45 degrees from extended in 90/90 starting position supine      Transfers   Comments difficulty with all transfers due to pain and decreased strength      Ambulation/Gait   Ambulation/Gait Yes    Ambulation/Gait Assistance 6: Modified independent (Device/Increase time);5: Supervision;4: Min guard    Ambulation Distance (Feet) 100 Feet   x2   Assistive device Rolling walker    Gait Pattern Decreased step length - left;Decreased stride length;Decreased dorsiflexion - right;Decreased dorsiflexion - left   flexed forward at hips/trunk   Ambulation Surface Level    Gait velocity slowed      Balance   Balance Assessed --   unable to stand without assistive device                      Objective measurements completed on examination: See above findings.       [place] Adult PT Treatment/Exercise - 04/04/20 0001      Lumbar Exercises: Stretches   Passive Hamstring Stretch Right;Left;1 rep;30 seconds   PT assist     Lumbar Exercises: Standing   Other Standing Lumbar Exercises standing to work on posture; alignment; endurance 1-2 min x 2 reps    Other Standing Lumbar Exercises small knee bends x 10; wt shift 1/2 step  fwd/back; wt shift side to side ~ 10-15 reps each with assist of walker      Lumbar Exercises: Seated   Other Seated Lumbar Exercises tighten core in sitting 5 sec hold x 10 reps                  PT Education - 04/04/20 1322    Education Details HEP POC    Person(s) Educated Patient;Spouse    Methods Explanation;Demonstration;Tactile cues;Verbal cues;Handout    Comprehension Verbalized understanding;Returned demonstration;Verbal cues required;Tactile cues required;Need further instruction               PT Long Term Goals - 04/04/20 1327      PT LONG TERM GOAL #1   Title The patient will be independent with HEP.    Time 6    Period Weeks    Status New    Target Date 05/16/20      PT LONG TERM GOAL #2   Title The patient will improve LE strength to 4/5 to 4+/5 hip flexion, extension and abduction.    Baseline -    Time 6    Period Weeks    Status New    Target Date 05/16/20      PT LONG TERM GOAL #3   Title Patient to demonstrate safe independent gait with assistive device as indicated for household distances    Baseline -    Time 6    Period Weeks    Status New    Target Date 05/16/20      PT LONG TERM GOAL #4   Title Patient to demonstrate sit to stand and stand to sit transfers independently and safely    Baseline -    Time 6    Period Weeks    Status New    Target Date 05/16/20      PT LONG TERM GOAL #5   Title Functional status measure increased to 23 indicating improved functional  activities and ADL's    Baseline -    Time 6    Period Weeks    Status New    Target Date 05/16/20                  Plan - 04/04/20 1323    Clinical Impression Statement Patient presents S/P third lumbar surgery with lumbar fusion L4/5 performed 03/26/20. Patient remains in lumbar brace when out of bed. He is dependent in ambulation requiring walker for standing and gait. He has poor posture and alignment; limited trunk and LE mobility/strength/functional abilities; persistent pain    Comorbidities cervical decompression, CIDP, h/o frequent falls, prior back surgery 05/2019    Examination-Activity Limitations Lift;Locomotion Level;Stairs;Stand;Sit    Examination-Participation Restrictions Community Activity    Stability/Clinical Decision Making Stable/Uncomplicated    Clinical Decision Making Moderate    Rehab Potential Good    PT Frequency 2x / week    PT Duration 6 weeks    PT Treatment/Interventions ADLs/Self Care Home Management;Gait training;Stair training;Functional mobility training;Therapeutic activities;Therapeutic exercise;Balance training;Patient/family education;Manual techniques;Taping;Cryotherapy;Electrical Stimulation;Iontophoresis 4mg /ml Dexamethasone;Moist Heat;Ultrasound;Neuromuscular re-education;Dry needling    PT Next Visit Plan Progress HEP to tolerance focusing on core stabilization; LE strengthening; improving functional mobility; gait training.    PT Home Exercise Plan [place] and Agree with Plan of Care Patient;Family member/caregiver    Family Member Consulted wife           Patient will benefit from skilled therapeutic intervention in order to improve the following deficits  and impairments:  Pain,Impaired flexibility,Hypomobility,Postural dysfunction,Decreased strength,Decreased range of motion,Difficulty walking,Decreased activity tolerance,Decreased balance  Visit Diagnosis: Muscle weakness (generalized)  Other abnormalities of gait  and mobility  Acute low back pain with sciatica, sciatica laterality unspecified, unspecified back pain laterality     Problem List Patient Active Problem List   Diagnosis Date Noted  . Lumbar herniated disc 02/01/2020  . Lumbar radiculopathy 05/22/2019  . Spastic gait 03/07/2019  . Myalgia 11/01/2018  . Chronic bilateral low back pain without sciatica 07/21/2018  . 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 CIDP (chronic inflammatory demyelinating polyneuropathy) ([place]) 12/30/2017  . Edema 03/01/2017  . Hypokalemia   . Slow transit constipation   . Steroid-induced hyperglycemia   . Urinary retention   . Accidental drug overdose   . Postoperative pain   . Hyponatremia   . Cervical myelopathy ([place]) 02/01/2017  . Shortness of breath at rest   . Hypoglycemia   . Spondylosis, cervical, with myelopathy   . Neuropathic pain   . Benign essential HTN   . Labile blood glucose   . Muscle spasm   . GAD (generalized anxiety disorder)   . Acute inflammatory demyelinating polyneuropathy ([place]) 01/11/2017  . Anxiety state   . Neurogenic bladder   . Depression 12/30/2016  . Leg weakness, bilateral 12/30/2016  . 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 GBS (Guillain Barre syndrome) ([place])   . AIDP (acute inflammatory demyelinating polyneuropathy) ([place]) 11/27/2016  . Weakness 11/20/2016  . Weakness of both arms 10/30/2016  . 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 Obstructive sleep apnea 03/24/2016  . Numbness 03/18/2016  . Mild nonproliferative diabetic retinopathy of both eyes without macular edema associated with type 2 diabetes mellitus ([place]) 05/24/2015  . Nuclear cataract of both eyes 05/24/2015  . Diabetes mellitus without complication ([place]) 93/26/7124  . 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 Eustachian tube dysfunction 02/12/2015  . Acute maxillary sinusitis 02/12/2015  . Wellness examination 08/22/2014  . Alkaline phosphatase  elevation 08/22/2014  . 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 Benign non-nodular prostatic hyperplasia with lower urinary tract symptoms 01/31/2014  . Lesion of eyelid 09/26/2013  . Screening for prostate cancer 04/24/2013  . Diabetes ([place]) 06/16/2012  . ED (erectile dysfunction) of organic origin 06/03/2012  . Routine general medical examination at a health care facility 04/10/2012  . BPH (benign prostatic hyperplasia) 02/03/2010  . HEMOCCULT POSITIVE STOOL 05/25/2008  . ELBOW PAIN, RIGHT 07/04/2007  . Dyslipidemia 02/16/2007  . ANXIETY 02/16/2007  . ERECTILE DYSFUNCTION 02/16/2007  . Essential hypertension 02/16/2007  . TESTICULAR MASS, LEFT 02/16/2007  . KNEE PAIN, CHRONIC 02/16/2007  . Michell Heinrich OF 02/16/2007    Celyn Nilda Simmer  PT, MPH  04/04/2020, 1:31 PM  [facility] [place], [place], [address] Phone: [phone]   Fax:  [phone]  Name: Kenneth Pace MRN: [id number] Date of Birth: [birth year]

## 2020-04-05 ENCOUNTER — Ambulatory Visit (HOSPITAL_COMMUNITY)
Admission: RE | Admit: 2020-04-05 | Discharge: 2020-04-05 | Disposition: A | Discharge status: Home / Self Care | Payer: Medicare Other | Source: Ambulatory Visit | Attending: Neurology | Admitting: Neurology

## 2020-04-05 ENCOUNTER — Ambulatory Visit: Payer: BC Managed Care – PPO | Admitting: Neurology

## 2020-04-05 DIAGNOSIS — G6181 Chronic inflammatory demyelinating polyneuritis: Secondary | ICD-10-CM | POA: Diagnosis present

## 2020-04-05 MED ORDER — IMMUNE GLOBULIN (HUMAN) 10 GM/100ML IV SOLN
1.0000 g/kg | INTRAVENOUS | Status: DC
  Administered 2020-04-05: 105 g via INTRAVENOUS
  Filled 2020-04-05: qty 800

## 2020-04-06 ENCOUNTER — Emergency Department (HOSPITAL_BASED_OUTPATIENT_CLINIC_OR_DEPARTMENT_OTHER): Payer: Medicare Other

## 2020-04-06 ENCOUNTER — Encounter (HOSPITAL_BASED_OUTPATIENT_CLINIC_OR_DEPARTMENT_OTHER): Payer: Self-pay | Admitting: *Deleted

## 2020-04-06 ENCOUNTER — Emergency Department (HOSPITAL_BASED_OUTPATIENT_CLINIC_OR_DEPARTMENT_OTHER)
Admission: EM | Admit: 2020-04-06 | Discharge: 2020-04-06 | Disposition: A | Discharge status: Home / Self Care | Payer: Medicare Other | Attending: Emergency Medicine | Admitting: Emergency Medicine

## 2020-04-06 ENCOUNTER — Other Ambulatory Visit: Payer: Self-pay

## 2020-04-06 DIAGNOSIS — E871 Hypo-osmolality and hyponatremia: Secondary | ICD-10-CM | POA: Diagnosis not present

## 2020-04-06 DIAGNOSIS — K5900 Constipation, unspecified: Secondary | ICD-10-CM | POA: Diagnosis not present

## 2020-04-06 DIAGNOSIS — Z794 Long term (current) use of insulin: Secondary | ICD-10-CM | POA: Insufficient documentation

## 2020-04-06 DIAGNOSIS — Z7982 Long term (current) use of aspirin: Secondary | ICD-10-CM | POA: Insufficient documentation

## 2020-04-06 DIAGNOSIS — M6281 Muscle weakness (generalized): Secondary | ICD-10-CM | POA: Diagnosis not present

## 2020-04-06 DIAGNOSIS — Z87438 Personal history of other diseases of male genital organs: Secondary | ICD-10-CM | POA: Diagnosis not present

## 2020-04-06 DIAGNOSIS — E109 Type 1 diabetes mellitus without complications: Secondary | ICD-10-CM | POA: Diagnosis not present

## 2020-04-06 DIAGNOSIS — I10 Essential (primary) hypertension: Secondary | ICD-10-CM | POA: Insufficient documentation

## 2020-04-06 DIAGNOSIS — R39198 Other difficulties with micturition: Secondary | ICD-10-CM | POA: Diagnosis not present

## 2020-04-06 LAB — COMPREHENSIVE METABOLIC PANEL
ALT: 21 U/L (ref 0–44)
AST: 23 U/L (ref 15–41)
Albumin: 3.1 g/dL — ABNORMAL LOW (ref 3.5–5.0)
Alkaline Phosphatase: 152 U/L — ABNORMAL HIGH (ref 38–126)
Anion gap: 9 (ref 5–15)
BUN: 17 mg/dL (ref 8–23)
CO2: 26 mmol/L (ref 22–32)
Calcium: 8.4 mg/dL — ABNORMAL LOW (ref 8.9–10.3)
Chloride: 94 mmol/L — ABNORMAL LOW (ref 98–111)
Creatinine, Ser: 0.76 mg/dL (ref 0.61–1.24)
GFR, Estimated: >60 mL/min (ref >60)
Glucose, Bld: 75 mg/dL (ref 70–99)
Potassium: 4 mmol/L (ref 3.5–5.1)
Sodium: 129 mmol/L — ABNORMAL LOW (ref 135–145)
Total Bilirubin: 0.2 mg/dL — ABNORMAL LOW (ref 0.3–1.2)
Total Protein: 9.3 g/dL — ABNORMAL HIGH (ref 6.5–8.1)

## 2020-04-06 LAB — CBC WITH DIFFERENTIAL/PLATELET
Abs Immature Granulocytes: 0.05 10*3/uL (ref 0.00–0.07)
Basophils Absolute: 0 10*3/uL (ref 0.0–0.1)
Basophils Relative: 1 %
Eosinophils Absolute: 0.1 10*3/uL (ref 0.0–0.5)
Eosinophils Relative: 1 %
HCT: 36.3 % — ABNORMAL LOW (ref 39.0–52.0)
Hemoglobin: 12 g/dL — ABNORMAL LOW (ref 13.0–17.0)
Immature Granulocytes: 1 %
Lymphocytes Relative: 11 %
Lymphs Abs: 0.7 10*3/uL (ref 0.7–4.0)
MCH: 29.2 pg (ref 26.0–34.0)
MCHC: 33.1 g/dL (ref 30.0–36.0)
MCV: 88.3 fL (ref 80.0–100.0)
Monocytes Absolute: 0.8 10*3/uL (ref 0.1–1.0)
Monocytes Relative: 13 %
Neutro Abs: 4.6 10*3/uL (ref 1.7–7.7)
Neutrophils Relative %: 73 %
Platelets: 421 10*3/uL — ABNORMAL HIGH (ref 150–400)
RBC: 4.11 MIL/uL — ABNORMAL LOW (ref 4.22–5.81)
RDW: 12.7 % (ref 11.5–15.5)
WBC: 6.3 10*3/uL (ref 4.0–10.5)
nRBC: 0 % (ref 0.0–0.2)

## 2020-04-06 LAB — LIPASE, BLOOD: Lipase: 20 U/L (ref 11–51)

## 2020-04-06 LAB — CBG MONITORING, ED
Glucose-Capillary: 82 mg/dL (ref 70–99)
Glucose-Capillary: 98 mg/dL (ref 70–99)

## 2020-04-06 NOTE — ED Notes (Addendum)
Pt has insulin pump-reports blood sugar was 41 pta. Rechecked in triage and was 82.

## 2020-04-06 NOTE — ED Triage Notes (Signed)
Pt reports constipation ~ 2 weeks. Last BM was 2 days prior to back surgery on 2/8. Pt has tried fleets, Engineer, materials citrate, senna, dulcolax, prune juice, "smooth move tea" over the last 4 days with no results

## 2020-04-06 NOTE — ED Provider Notes (Signed)
[facility] EMERGENCY DEPARTMENT Provider Note   CSN: [id number] Arrival date & time: 04/06/20  1526     History Chief Complaint  Patient presents with  . Constipation    Kenneth Pace is a 70 y.o. male.  Patient is a 70 year old male who presents with constipation.  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  He also has had some back issues recently with recent back surgery earlier this month.  He has been taking oxycodone for his back pain since his surgery.  He feels that this is likely what is related to his constipation.  Apparently has not had a real bowel movement in over a week.  He denies any associate abdominal pain.  No nausea or vomiting.  His wife has tried multiple medications at home including laxative, suppositories, Fleet enemas without success.  He does have chronic weakness in his lower extremities.  He reports that he seems a little weaker than baseline but feels that this could be related to him being deconditioned since his surgery.  He also has had some difficulty urinating.  Mostly in that he cannot seem to get to the bathroom in time and has had to wear a diaper.  He suspects this is related to his back pain that is causing him increased mobility issues.  He does have the sense that he has to urinate but cannot always control it.  He does not have any difficulty controlling his stool.  No recent fevers.  No increase in his back pain.  He has had ongoing pain since the surgery but it is relatively unchanged.        Past Medical History:  Diagnosis Date  . ADENOIDECTOMY, HX OF 02/16/2007  . Anxiety    pt. states he does not have anxiety.  Marland Kitchen BENIGN PROSTATIC HYPERTROPHY, WITH OBSTRUCTION 02/03/2010  . 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 DIABETES MELLITUS, TYPE I, UNCONTROLLED 12/29/2006  . ERECTILE DYSFUNCTION 02/16/2007  . Hearing loss    wears hearing aids  .  HYPERLIPIDEMIA 02/16/2007  . HYPERTENSION 02/16/2007  . Nerve pain    per patient "on lower back and both legs"  . Sleep apnea    uses CPAP  . TESTICULAR MASS, LEFT 02/16/2007    Patient Active Problem List   Diagnosis Date Noted  . Lumbar herniated disc 02/01/2020  . Lumbar radiculopathy 05/22/2019  . Spastic gait 03/07/2019  . Myalgia 11/01/2018  . Chronic bilateral low back pain without sciatica 07/21/2018  . 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 CIDP (chronic inflammatory demyelinating polyneuropathy) ([place]) 12/30/2017  . Edema 03/01/2017  . Hypokalemia   . Slow transit constipation   . Steroid-induced hyperglycemia   . Urinary retention   . Accidental drug overdose   . Postoperative pain   . Hyponatremia   . Cervical myelopathy ([place]) 02/01/2017  . Shortness of breath at rest   . Hypoglycemia   . Spondylosis, cervical, with myelopathy   . Neuropathic pain   . Benign essential HTN   . Labile blood glucose   . Muscle spasm   . GAD (generalized anxiety disorder)   . Acute inflammatory demyelinating polyneuropathy ([place]) 01/11/2017  . Anxiety state   . Neurogenic bladder   . Depression 12/30/2016  . Leg weakness, bilateral 12/30/2016  . 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 GBS (Guillain Barre syndrome) ([place])   . AIDP (acute inflammatory demyelinating polyneuropathy) ([place]) 11/27/2016  .  Weakness 11/20/2016  . Weakness of both arms 10/30/2016  . 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 Obstructive sleep apnea 03/24/2016  . Numbness 03/18/2016  . Mild nonproliferative diabetic retinopathy of both eyes without macular edema associated with type 2 diabetes mellitus ([place]) 05/24/2015  . Nuclear cataract of both eyes 05/24/2015  . Diabetes mellitus without complication ([place]) 70/26/3785  . 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 Eustachian tube dysfunction 02/12/2015  . Acute maxillary sinusitis 02/12/2015  . Wellness  examination 08/22/2014  . Alkaline phosphatase elevation 08/22/2014  . 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 Benign non-nodular prostatic hyperplasia with lower urinary tract symptoms 01/31/2014  . Lesion of eyelid 09/26/2013  . Screening for prostate cancer 04/24/2013  . Diabetes ([place]) 06/16/2012  . ED (erectile dysfunction) of organic origin 06/03/2012  . Routine general medical examination at a health care facility 04/10/2012  . BPH (benign prostatic hyperplasia) 02/03/2010  . HEMOCCULT POSITIVE STOOL 05/25/2008  . ELBOW PAIN, RIGHT 07/04/2007  . Dyslipidemia 02/16/2007  . ANXIETY 02/16/2007  . ERECTILE DYSFUNCTION 02/16/2007  . Essential hypertension 02/16/2007  . TESTICULAR MASS, LEFT 02/16/2007  . KNEE PAIN, CHRONIC 02/16/2007  . ADENOIDECTOMY, HX OF 02/16/2007    Past Surgical History:  Procedure Laterality Date  . ANTERIOR CERVICAL DECOMP/DISCECTOMY FUSION N/A 02/01/2017   Procedure: ANTERIOR CERVICAL DECOMPRESSION/DISCECTOMY FUSION CERVICAL THREE-FOUR , CERVICAL FOUR-FIVE  CERVICAL FIVE-SIX;  Surgeon: Consuella Lose, MD;  Location: [facility];  Service: Neurosurgery;  Laterality: N/A;  . APPENDECTOMY    . BACK SURGERY    . COLONOSCOPY  02/05/2010   [facility]  . EYE SURGERY Right    Dr. Katy Fitch  . KNEE SURGERY     2 Lknee arthroscopy, 3 R knee arthroscpoy  . KNEE SURGERY Right 11/12/2016  . LUMBAR LAMINECTOMY/DECOMPRESSION MICRODISCECTOMY Right 05/22/2019   Procedure: LAMINOTOMY AND MICRODISCECTOMY RIGHT LUMBAR FOUR- LUMBAR FIVE;  Surgeon: Consuella Lose, MD;  Location: [facility];  Service: Neurosurgery;  Laterality: Right;  LAMINOTOMY AND MICRODISCECTOMY RIGHT LUMBAR FOUR- LUMBAR FIVE  . LUMBAR LAMINECTOMY/DECOMPRESSION MICRODISCECTOMY Right 02/01/2020   Procedure: REDO MICRODISCECTOMY RT LUMBAR FOUR-FIVE;  Surgeon: Consuella Lose, MD;  Location: [facility];  Service: Neurosurgery;  Laterality: Right;  posterior  . TONSILLECTOMY         Family  History  Problem Relation Age of Onset  . Dementia Mother   . Diabetes Mellitus I Mother   . Hypertension Father        65  . Healthy Sister   . Rheum arthritis Brother   . Cancer Neg Hx   . Colon cancer Neg Hx   . Esophageal cancer Neg Hx   . Stomach cancer Neg Hx   . Rectal cancer Neg Hx   . Colon polyps Neg Hx     Social History   Tobacco Use  . Smoking status: Never Smoker  . Smokeless tobacco: Never Used  Vaping Use  . Vaping Use: Never used  Substance Use Topics  . Alcohol use: No  . Drug use: No    Home Medications Prior to Admission medications   Medication Sig Start Date End Date Taking? Authorizing Provider  acetaminophen (TYLENOL) 500 MG tablet Take 500 mg by mouth every 6 (six) hours as needed for mild pain.    [provider]  amitriptyline (ELAVIL) 75 MG tablet TAKE 1 TABLET(75 MG) BY MOUTH AT BEDTIME Patient taking differently: Take 75 mg by mouth at bedtime. 11/27/19   Jamse Arn, MD  aspirin EC  81 MG tablet Take 1 tablet (81 mg total) by mouth daily. Swallow whole. 04/01/20   Costella, Vista Mink, PA-C  B Complex Vitamins (B-COMPLEX/B-12 PO) Take 1 tablet by mouth at bedtime.    [provider]  baclofen (LIORESAL) 10 MG tablet Take 2 tablets (20 mg total) by mouth 3 (three) times daily. Patient taking differently: Take 20-30 mg by mouth 3 (three) times daily. 05/29/19   Narda Amber K, DO  celecoxib (CELEBREX) 100 MG capsule TAKE 1 CAPSULE(100 MG) BY MOUTH AT BEDTIME Patient taking differently: Take 100 mg by mouth at bedtime. 03/07/19   Jamse Arn, MD  clotrimazole-betamethasone (LOTRISONE) cream Apply 1 application topically 2 (two) times daily. Patient taking differently: Apply 1 application topically daily as needed (Dry skin). 11/02/18   Renato Shin, MD  Cranberry 400 MG TABS Take 400 mg by mouth at bedtime.    [provider]  DULoxetine (CYMBALTA) 60 MG capsule TAKE 1 CAPSULE DAILY 03/29/20   Jamse Arn,  MD  finasteride (PROSCAR) 5 MG tablet Take 1 tablet (5 mg total) by mouth at bedtime. 03/25/20   Shelda Pal, DO  glucagon 1 MG injection Inject 1 mg into the vein once as needed. Patient taking differently: Inject 1 mg into the vein once as needed (Low blood gluose). 07/25/13   Renato Shin, MD  glucose blood (BAYER CONTOUR NEXT TEST) test strip MEDICALLY NECESSARY FOR USE WITH PUMP; Use to check blood sugar 5 times per day and prn; E11.42 02/03/18   Renato Shin, MD  Immune Globulin 10% (IMMUNE GLOBULIN 10%) 10G/146mL (10,000mg /139mL) SOLN Inject 105 g into the vein every 6 (six) weeks. 07/20/19   Narda Amber K, DO  Insulin Human (INSULIN PUMP) SOLN Inject 120 each into the skin daily. HUMALOG    [provider]  Insulin Infusion Pump Supplies (PARADIGM RESERVOIR 3ML) MISC 1 Device by Does not apply route every 3 (three) days. 08/31/18   Renato Shin, MD  Insulin Infusion Pump Supplies (QUICK-SET INFUSION 43" 9MM) MISC 1 Device by Does not apply route every 3 (three) days. 08/31/18   Renato Shin, MD  insulin lispro (HUMALOG) 100 UNIT/ML injection INJECT 120 UNITS DAILY IN PUMP 10/23/19   Renato Shin, MD  lidocaine (LIDODERM) 5 % At 7 am and remove at 7 pm. Apply 1 on each side. Patient taking differently: Place 2 patches onto the skin daily as needed (pain). 04/25/19   Jamse Arn, MD  lisinopril-hydrochlorothiazide (ZESTORETIC) 10-12.5 MG tablet Take 1 tablet by mouth at bedtime. 03/25/20   Shelda Pal, DO  methocarbamol (ROBAXIN-750) 750 MG tablet Take 1 tablet (750 mg total) by mouth 3 (three) times daily as needed for muscle spasms. 03/25/20   Shelda Pal, DO  Multiple Vitamin (MULTIVITAMIN) capsule Take 1 capsule by mouth at bedtime.    [provider]  oxyCODONE (ROXICODONE) 5 MG immediate release tablet Take 1-2 tablets (5-10 mg total) by mouth every 4 (four) hours as needed. 03/27/20 03/27/21  Costella, Vista Mink, PA-C   oxyCODONE-acetaminophen (PERCOCET) 7.5-325 MG tablet Take 1 tablet by mouth every 4 (four) hours as needed for severe pain. 02/02/20   Costella, Vista Mink, PA-C  pseudoephedrine (SUDAFED) 30 MG tablet Take 30 mg by mouth at bedtime as needed for congestion.    [provider]  scopolamine (TRANSDERM-SCOP) 1 MG/3DAYS Place 1 patch (1.5 mg total) onto the skin every 3 (three) days. Patient taking differently: Place 1 patch onto the skin every 3 (three)  days. Only when cruising 03/24/19   Shelda Pal, DO  traMADol (ULTRAM) 50 MG tablet Take 50 mg by mouth every 6 (six) hours as needed (mild pain). 01/29/20   [provider]    Allergies    Patient has no known allergies.  Review of Systems   Review of Systems  Constitutional: Negative for chills, diaphoresis, fatigue and fever.  HENT: Negative for congestion, rhinorrhea and sneezing.   Eyes: Negative.   Respiratory: Negative for cough, chest tightness and shortness of breath.   Cardiovascular: Negative for chest pain and leg swelling.  Gastrointestinal: Positive for constipation. Negative for abdominal pain, blood in stool, diarrhea, nausea and vomiting.  Genitourinary: Positive for difficulty urinating. Negative for flank pain, frequency and hematuria.  Musculoskeletal: Positive for back pain. Negative for arthralgias.  Skin: Negative for rash.  Neurological: Positive for weakness (Lower extremity weakness). Negative for dizziness, speech difficulty, numbness and headaches.    Physical Exam Updated Vital Signs BP (!) 147/69 (BP Location: Right Arm)   Pulse 91   Temp 98.6 F (37 C) (Oral)   Resp 16   Ht 6' (1.829 m)   Wt 106.6 kg   SpO2 94%   BMI 31.87 kg/m   Physical Exam Constitutional:      Appearance: He is well-developed and well-nourished.  HENT:     Head: Normocephalic and atraumatic.  Eyes:     Pupils: Pupils are equal, round, and reactive to light.  Cardiovascular:     Rate and Rhythm:  Normal rate and regular rhythm.     Heart sounds: Normal heart sounds.  Pulmonary:     Effort: Pulmonary effort is normal. No respiratory distress.     Breath sounds: Normal breath sounds. No wheezing or rales.  Chest:     Chest wall: No tenderness.  Abdominal:     General: Bowel sounds are normal.     Palpations: Abdomen is soft.     Tenderness: There is no abdominal tenderness. There is no guarding or rebound.  Genitourinary:    Comments: Rectal tone is normal, he has no numbness in the perennial area/no impaction Musculoskeletal:        General: No edema. Normal range of motion.     Cervical back: Normal range of motion and neck supple.     Comments: He has a healing incision to his lumbar spine area.  It appears to be well-healing without swelling or suggestions of infection.  Lymphadenopathy:     Cervical: No cervical adenopathy.  Skin:    General: Skin is warm and dry.     Findings: No rash.  Neurological:     Mental Status: He is alert and oriented to person, place, and time.     Comments: He has weakness to his lower extremities bilaterally.  He can raise them just a little bit off the bed.  This is close to baseline per patient.  He has normal sensation to light touch in both lower extremities.  Psychiatric:        Mood and Affect: Mood and affect normal.     ED Results / Procedures / Treatments   Labs (all labs ordered are listed, but only abnormal results are displayed) Labs Reviewed  CBC WITH DIFFERENTIAL/PLATELET - Abnormal; Notable for the following components:      Result Value   RBC 4.11 (*)    Hemoglobin 12.0 (*)    HCT 36.3 (*)    Platelets 421 (*)    All other  components within normal limits  COMPREHENSIVE METABOLIC PANEL - Abnormal; Notable for the following components:   Sodium 129 (*)    Chloride 94 (*)    Calcium 8.4 (*)    Total Protein 9.3 (*)    Albumin 3.1 (*)    Alkaline Phosphatase 152 (*)    Total Bilirubin 0.2 (*)    All other components  within normal limits  LIPASE, BLOOD  CBG MONITORING, ED  CBG MONITORING, ED  CBG MONITORING, ED    EKG None  Radiology DG Abdomen 1 View  Result Date: 04/06/2020 CLINICAL DATA:  Constipation following surgery. EXAM: ABDOMEN - 1 VIEW COMPARISON:  None. FINDINGS: A large amount of stool is noted within the ascending and transverse colon. There is gas noted within the descending colon and rectum. No dilated small bowel loops are noted. IMPRESSION: Large amount of stool within the ascending and transverse colon which can be seen with constipation. No evidence of small bowel obstruction. Electronically Signed   By: Margarette Canada M.D.   On: 04/06/2020 17:24    Procedures Procedures   Medications Ordered in ED Medications - No data to display  ED Course  I have reviewed the triage vital signs and the nursing notes.  Pertinent labs & imaging results that were available during my care of the patient were reviewed by me and considered in my medical decision making (see chart for details).    MDM Rules/Calculators/A&P                          Patient presents with constipation.  He does not have any associate abdominal pain.  X-rays do not reveal any suggestions of an ileus or obstruction.  He was given a soapsuds enema and had a large bowel movement following that and is feeling better.  His labs are grossly nonconcerning.  His sodium is a bit low at 129.  He has leg weakness which he says is a little worse than his baseline although he feels this is related to deconditioning.  He also has some urinary issues but it seems that this is more related to decreased mobility rather than true incontinence.  I have a lower suspicion that cauda equina would potentially be causing his constipation and conjunction with the urinary difficulties and leg weakness.  It seems to be more of a chronic issue although I did discuss with the family that I could not completely exclude cauda equina and did discuss  getting an MRI.  In order to do this, we would transfer him to [facility] or [place] long as we do not have that ability here.  I have a long discussion about this with the patient and his wife and given the relatively low suspicion of cauda equina but also the risk involved with cauda equina and not detecting it in a timely fashion.  They have opted to forego the MRI and will follow up with her neurosurgeon.  They have an appointment on Wednesday but I did advise him to discuss his symptoms on Monday with the neurosurgeon.  He will continue taking his stool softeners at home.  I did advise that he will need to have a sodium level rechecked within the next week.  Return precautions were given. Final Clinical Impression(s) / ED Diagnoses Final diagnoses:  Constipation, unspecified constipation type  Hyponatremia    Rx / DC Orders ED Discharge Orders    None  Malvin Johns, MD 04/06/20 2009

## 2020-04-06 NOTE — ED Notes (Signed)
Bowel movement x1. EDP Belfi made aware.

## 2020-04-06 NOTE — Discharge Instructions (Addendum)
Continue taking your regular stool softeners.  Follow-up with your neurosurgeon as scheduled.  Your sodium was low and should be rechecked within the next week by your primary care doctor.  Return here as needed if you have any worsening symptoms.

## 2020-04-08 ENCOUNTER — Other Ambulatory Visit: Payer: Self-pay

## 2020-04-08 ENCOUNTER — Encounter: Payer: Self-pay | Admitting: Rehabilitative and Restorative Service Providers"

## 2020-04-08 ENCOUNTER — Ambulatory Visit (INDEPENDENT_AMBULATORY_CARE_PROVIDER_SITE_OTHER): Payer: Medicare Other | Admitting: Rehabilitative and Restorative Service Providers"

## 2020-04-08 DIAGNOSIS — M5440 Lumbago with sciatica, unspecified side: Secondary | ICD-10-CM | POA: Diagnosis not present

## 2020-04-08 DIAGNOSIS — R2689 Other abnormalities of gait and mobility: Secondary | ICD-10-CM | POA: Diagnosis not present

## 2020-04-08 DIAGNOSIS — M6281 Muscle weakness (generalized): Secondary | ICD-10-CM | POA: Diagnosis present

## 2020-04-08 DIAGNOSIS — R2681 Unsteadiness on feet: Secondary | ICD-10-CM | POA: Diagnosis not present

## 2020-04-08 NOTE — Therapy (Signed)
[facility] [place], [place], [address] Phone: [phone]   Fax:  [phone]  Physical Therapy Treatment  Patient Details  Name: Kenneth Pace MRN: [id number] Date of Birth: 1950-08-21 Referring Provider (PT): Consuella Lose, MD   Encounter Date: 04/08/2020   PT End of Session - 04/08/20 1018    Visit Number 2    Number of Visits 12    Date for PT Re-Evaluation 05/16/20    PT Start Time 1017    PT Stop Time 1103    PT Time Calculation (min) 46 min    Activity Tolerance Patient tolerated treatment well           Past Medical History:  Diagnosis Date  . ADENOIDECTOMY, HX OF 02/16/2007  . Anxiety    pt. states he does not have anxiety.  Marland Kitchen BENIGN PROSTATIC HYPERTROPHY, WITH OBSTRUCTION 02/03/2010  . 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 DIABETES MELLITUS, TYPE I, UNCONTROLLED 12/29/2006  . ERECTILE DYSFUNCTION 02/16/2007  . Hearing loss    wears hearing aids  . HYPERLIPIDEMIA 02/16/2007  . HYPERTENSION 02/16/2007  . Nerve pain    per patient "on lower back and both legs"  . Sleep apnea    uses CPAP  . TESTICULAR MASS, LEFT 02/16/2007    Past Surgical History:  Procedure Laterality Date  . ANTERIOR CERVICAL DECOMP/DISCECTOMY FUSION N/A 02/01/2017   Procedure: ANTERIOR CERVICAL DECOMPRESSION/DISCECTOMY FUSION CERVICAL THREE-FOUR , CERVICAL FOUR-FIVE  CERVICAL FIVE-SIX;  Surgeon: Consuella Lose, MD;  Location: [facility];  Service: Neurosurgery;  Laterality: N/A;  . APPENDECTOMY    . BACK SURGERY    . COLONOSCOPY  02/05/2010   [facility]  . EYE SURGERY Right    Dr. Katy Fitch  . KNEE SURGERY     2 Lknee arthroscopy, 3 R knee arthroscpoy  . KNEE SURGERY Right 11/12/2016  . LUMBAR LAMINECTOMY/DECOMPRESSION MICRODISCECTOMY Right 05/22/2019   Procedure: LAMINOTOMY AND MICRODISCECTOMY RIGHT LUMBAR FOUR- LUMBAR FIVE;  Surgeon: Consuella Lose, MD;   Location: [facility];  Service: Neurosurgery;  Laterality: Right;  LAMINOTOMY AND MICRODISCECTOMY RIGHT LUMBAR FOUR- LUMBAR FIVE  . LUMBAR LAMINECTOMY/DECOMPRESSION MICRODISCECTOMY Right 02/01/2020   Procedure: REDO MICRODISCECTOMY RT LUMBAR FOUR-FIVE;  Surgeon: Consuella Lose, MD;  Location: [facility];  Service: Neurosurgery;  Laterality: Right;  posterior  . TONSILLECTOMY      There were no vitals filed for this visit.   Subjective Assessment - 04/08/20 1019    Subjective Patient reports that he continues to have back pain at 7-8/10. Wife states that he has not had a bowel movement since surgery. He was in the ED Saturday to address this but did not have a regular BM. [name] tried to work on his exercises at home.    Currently in Pain? Yes    Pain Score 7     Pain Location Back    Pain Orientation Lower;Right;Left    Pain Descriptors / Indicators Dull;Aching;Stabbing    Pain Type Chronic pain;Surgical pain    Pain Onset More than a month ago    Pain Frequency Constant                             [facility] Adult PT Treatment/Exercise - 04/08/20 0001      Lumbar Exercises: Aerobic   Nustep L5 x 7 min UEat 11.5      Lumbar Exercises: Standing   Wall Slides Limitations marching  in place with walker 20 reps each side    Shoulder ADduction AROM;Both;20 reps   working on balance unsupported standing   Shoulder Adduction Limitations reaching overhead bilat UE's x 15 reps    Other Standing Lumbar Exercises standing to work on posture; alignment; endurance 1-2 min x 2 reps    Other Standing Lumbar Exercises small knee bends x 10; wt shift 1/2 step fwd/back; wt shift side to side ~ 10-15 reps each with assist of walker      Lumbar Exercises: Seated   Sit to Stand 5 reps    Sit to Stand Limitations from elevated surface    Other Seated Lumbar Exercises tighten core in sitting hands resting on knees 5 sec hold x 10 reps      Knee/Hip Exercises: Standing   Hip Extension  AROM;Stengthening;Right;Left;15 reps    Other Standing Knee Exercises working on standing and gait with walker - focus on posture and alignment as well as stepping without shuffling feet      Shoulder Exercises: Seated   Retraction Strengthening;Both;10 reps;Theraband    Theraband Level (Shoulder Retraction) Level 2 (Red)    Row Strengthening;Both;10 reps;Theraband    Theraband Level (Shoulder Row) Level 2 (Red)    Row Limitations theraband under feet                  PT Education - 04/08/20 1059    Education Details HEP    Person(s) Educated Patient    Methods Explanation;Demonstration;Tactile cues;Verbal cues;Handout    Comprehension Verbalized understanding;Returned demonstration;Verbal cues required;Tactile cues required               PT Long Term Goals - 04/04/20 1327      PT LONG TERM GOAL #1   Title The patient will be independent with HEP.    Time 6    Period Weeks    Status New    Target Date 05/16/20      PT LONG TERM GOAL #2   Title The patient will improve LE strength to 4/5 to 4+/5 hip flexion, extension and abduction.    Baseline -    Time 6    Period Weeks    Status New    Target Date 05/16/20      PT LONG TERM GOAL #3   Title Patient to demonstrate safe independent gait with assistive device as indicated for household distances    Baseline -    Time 6    Period Weeks    Status New    Target Date 05/16/20      PT LONG TERM GOAL #4   Title Patient to demonstrate sit to stand and stand to sit transfers independently and safely    Baseline -    Time 6    Period Weeks    Status New    Target Date 05/16/20      PT LONG TERM GOAL #5   Title Functional status measure increased to 23 indicating improved functional activities and ADL's    Baseline -    Time 6    Period Weeks    Status New    Target Date 05/16/20                 Plan - 04/08/20 1026    Clinical Impression Statement Persistent pain in the LB with any movement.  Patient reports that he is trying to work on some of the exercises at home. He is in his brace when he  is not lying flat. Patient moves slowly with difficulty. Added Nustep and continued work on core stabilization activities in sitting and standing.    Rehab Potential Good    PT Frequency 2x / week    PT Duration 6 weeks    PT Treatment/Interventions ADLs/Self Care Home Management;Gait training;Stair training;Functional mobility training;Therapeutic activities;Therapeutic exercise;Balance training;Patient/family education;Manual techniques;Taping;Cryotherapy;Electrical Stimulation;Iontophoresis 4mg /ml Dexamethasone;Moist Heat;Ultrasound;Neuromuscular re-education;Dry needling    PT Next Visit Plan Progress HEP to tolerance focusing on core stabilization; LE strengthening; improving functional mobility; gait training.    PT Home Exercise Plan [place] and Agree with Plan of Care Patient;Family member/caregiver           Patient will benefit from skilled therapeutic intervention in order to improve the following deficits and impairments:     Visit Diagnosis: Muscle weakness (generalized)  Other abnormalities of gait and mobility  Acute low back pain with sciatica, sciatica laterality unspecified, unspecified back pain laterality  Unsteadiness on feet     Problem List Patient Active Problem List   Diagnosis Date Noted  . Lumbar herniated disc 02/01/2020  . Lumbar radiculopathy 05/22/2019  . Spastic gait 03/07/2019  . Myalgia 11/01/2018  . Chronic bilateral low back pain without sciatica 07/21/2018  . 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 CIDP (chronic inflammatory demyelinating polyneuropathy) ([place]) 12/30/2017  . Edema 03/01/2017  . Hypokalemia   . Slow transit constipation   . Steroid-induced hyperglycemia   . Urinary retention   . Accidental drug overdose   . Postoperative pain   . Hyponatremia   . Cervical myelopathy ([place]) 02/01/2017  . Shortness of breath  at rest   . Hypoglycemia   . Spondylosis, cervical, with myelopathy   . Neuropathic pain   . Benign essential HTN   . Labile blood glucose   . Muscle spasm   . GAD (generalized anxiety disorder)   . Acute inflammatory demyelinating polyneuropathy ([place]) 01/11/2017  . Anxiety state   . Neurogenic bladder   . Depression 12/30/2016  . Leg weakness, bilateral 12/30/2016  . 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 GBS (Guillain Barre syndrome) ([place])   . AIDP (acute inflammatory demyelinating polyneuropathy) ([place]) 11/27/2016  . Weakness 11/20/2016  . Weakness of both arms 10/30/2016  . 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 Obstructive sleep apnea 03/24/2016  . Numbness 03/18/2016  . Mild nonproliferative diabetic retinopathy of both eyes without macular edema associated with type 2 diabetes mellitus ([place]) 05/24/2015  . Nuclear cataract of both eyes 05/24/2015  . Diabetes mellitus without complication ([place]) 67/89/3810  . 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 Eustachian tube dysfunction 02/12/2015  . Acute maxillary sinusitis 02/12/2015  . Wellness examination 08/22/2014  . Alkaline phosphatase elevation 08/22/2014  . 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 Benign non-nodular prostatic hyperplasia with lower urinary tract symptoms 01/31/2014  . Lesion of eyelid 09/26/2013  . Screening for prostate cancer 04/24/2013  . Diabetes ([place]) 06/16/2012  . ED (erectile dysfunction) of organic origin 06/03/2012  . Routine general medical examination at a health care facility 04/10/2012  . BPH (benign prostatic hyperplasia) 02/03/2010  . HEMOCCULT POSITIVE STOOL 05/25/2008  . ELBOW PAIN, RIGHT 07/04/2007  . Dyslipidemia 02/16/2007  . ANXIETY 02/16/2007  . ERECTILE DYSFUNCTION 02/16/2007  . Essential hypertension 02/16/2007  . TESTICULAR MASS, LEFT 02/16/2007  . KNEE PAIN, CHRONIC 02/16/2007  . ADENOIDECTOMY, HX OF 02/16/2007     Kimoni Pagliarulo Nilda Simmer PT, MPH  04/08/2020, 11:08 AM  [facility] [place], [place], [address] Phone: [phone]   Fax:  [phone]  Name: BRITTIN JANIK MRN: [id number] Date of Birth: 03/29/1950

## 2020-04-08 NOTE — Patient Instructions (Signed)
Access Code: [email or web address]: [email or web address]: 02/21/2022Prepared by: Taino Maertens HoltExercises  Standing Shoulder External Rotation with Resistance - 2 x daily - 7 x weekly - 1-3 sets - 10 reps - 2-3 sec hold  Seated Alternating Shoulder Row with Resistance Anchored at Feet - 2 x daily - 7 x weekly - 1-3 sets - 10 reps - 2-3sec hold  Plus written exercises  Sit to stand x 5

## 2020-04-11 ENCOUNTER — Ambulatory Visit (INDEPENDENT_AMBULATORY_CARE_PROVIDER_SITE_OTHER): Payer: Medicare Other | Admitting: Rehabilitative and Restorative Service Providers"

## 2020-04-11 ENCOUNTER — Encounter: Payer: Self-pay | Admitting: Rehabilitative and Restorative Service Providers"

## 2020-04-11 ENCOUNTER — Other Ambulatory Visit: Payer: Self-pay

## 2020-04-11 DIAGNOSIS — R2681 Unsteadiness on feet: Secondary | ICD-10-CM

## 2020-04-11 DIAGNOSIS — R2689 Other abnormalities of gait and mobility: Secondary | ICD-10-CM | POA: Diagnosis not present

## 2020-04-11 DIAGNOSIS — M6281 Muscle weakness (generalized): Secondary | ICD-10-CM | POA: Diagnosis present

## 2020-04-11 DIAGNOSIS — M5440 Lumbago with sciatica, unspecified side: Secondary | ICD-10-CM

## 2020-04-11 NOTE — Therapy (Signed)
[facility] [place], [place], [address] Phone: [phone]   Fax:  [phone]  Physical Therapy Treatment  Patient Details  Name: Kenneth Pace MRN: [id number] Date of Birth: May 01, 1950 Referring Provider (PT): Consuella Lose, MD   Encounter Date: 04/11/2020   PT End of Session - 04/11/20 1359    Visit Number 3    Number of Visits 12    Date for PT Re-Evaluation 05/16/20    PT Start Time 2426    PT Stop Time 1443    PT Time Calculation (min) 45 min    Activity Tolerance Patient tolerated treatment well           Past Medical History:  Diagnosis Date  . ADENOIDECTOMY, HX OF 02/16/2007  . Anxiety    pt. states he does not have anxiety.  Marland Kitchen BENIGN PROSTATIC HYPERTROPHY, WITH OBSTRUCTION 02/03/2010  . 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 DIABETES MELLITUS, TYPE I, UNCONTROLLED 12/29/2006  . ERECTILE DYSFUNCTION 02/16/2007  . Hearing loss    wears hearing aids  . HYPERLIPIDEMIA 02/16/2007  . HYPERTENSION 02/16/2007  . Nerve pain    per patient "on lower back and both legs"  . Sleep apnea    uses CPAP  . TESTICULAR MASS, LEFT 02/16/2007    Past Surgical History:  Procedure Laterality Date  . ANTERIOR CERVICAL DECOMP/DISCECTOMY FUSION N/A 02/01/2017   Procedure: ANTERIOR CERVICAL DECOMPRESSION/DISCECTOMY FUSION CERVICAL THREE-FOUR , CERVICAL FOUR-FIVE  CERVICAL FIVE-SIX;  Surgeon: Consuella Lose, MD;  Location: [facility];  Service: Neurosurgery;  Laterality: N/A;  . APPENDECTOMY    . BACK SURGERY    . COLONOSCOPY  02/05/2010   [facility]  . EYE SURGERY Right    Dr. Katy Fitch  . KNEE SURGERY     2 Lknee arthroscopy, 3 R knee arthroscpoy  . KNEE SURGERY Right 11/12/2016  . LUMBAR LAMINECTOMY/DECOMPRESSION MICRODISCECTOMY Right 05/22/2019   Procedure: LAMINOTOMY AND MICRODISCECTOMY RIGHT LUMBAR FOUR- LUMBAR FIVE;  Surgeon: Consuella Lose, MD;   Location: [facility];  Service: Neurosurgery;  Laterality: Right;  LAMINOTOMY AND MICRODISCECTOMY RIGHT LUMBAR FOUR- LUMBAR FIVE  . LUMBAR LAMINECTOMY/DECOMPRESSION MICRODISCECTOMY Right 02/01/2020   Procedure: REDO MICRODISCECTOMY RT LUMBAR FOUR-FIVE;  Surgeon: Consuella Lose, MD;  Location: [facility];  Service: Neurosurgery;  Laterality: Right;  posterior  . TONSILLECTOMY      There were no vitals filed for this visit.   Subjective Assessment - 04/11/20 1400    Subjective Patient reports that he saw the surgeon yesterday and X-rays showed some changes in the disc spacer at the area of fusion. Surgeon does not want to do more surgery at this time. He will address osteopenia/osteoporesis. Working to regulate bowel movement. Working on PT makes him tired for the remainder of the day but he is back to baseline the next morning.    Currently in Pain? Yes    Pain Score 6     Pain Location Back    Pain Orientation Right;Left    Pain Descriptors / Indicators Dull;Aching;Stabbing    Pain Type Chronic pain;Surgical pain    Pain Onset More than a month ago    Pain Frequency Constant    Aggravating Factors  changing positions    Pain Relieving Factors minor improvement with medication; rest; stop moving                             [facility] Adult  PT Treatment/Exercise - 04/11/20 0001      Lumbar Exercises: Standing   Wall Slides Limitations marching in place with walker 20 reps each side    Other Standing Lumbar Exercises standing to work on posture; alignment; endurance 1-2 min x 2 reps    Other Standing Lumbar Exercises walking 120 ft with 10# on walker SBA      Lumbar Exercises: Seated   Sit to Stand 10 reps    Sit to Stand Limitations from elevated surface - nustep seat    Other Seated Lumbar Exercises overhead press 2# each hand x 10; repeated w/ 4# each UE x 20 reps; chest press 4# each UE x 20      Knee/Hip Exercises: Standing   Hip Extension AROM;Stengthening;Right;Left;2  sets;5 reps;1 set;10 reps    Functional Squat 2 sets;10 reps   holding red TB each hand to add resistance   Other Standing Knee Exercises working on standing and gait with walker - focus on posture and alignment as well as stepping without shuffling feet      Shoulder Exercises: Seated   Retraction Strengthening;Both;10 reps;Theraband    Theraband Level (Shoulder Retraction) Level 2 (Red)    Row Strengthening;Both;10 reps;Theraband    Theraband Level (Shoulder Row) Level 3 (Green)    Other Seated Exercises red theraband flexbard overhead each UE x ~ 1 min x 2 reps                       PT Long Term Goals - 04/04/20 1327      PT LONG TERM GOAL #1   Title The patient will be independent with HEP.    Time 6    Period Weeks    Status New    Target Date 05/16/20      PT LONG TERM GOAL #2   Title The patient will improve LE strength to 4/5 to 4+/5 hip flexion, extension and abduction.    Baseline -    Time 6    Period Weeks    Status New    Target Date 05/16/20      PT LONG TERM GOAL #3   Title Patient to demonstrate safe independent gait with assistive device as indicated for household distances    Baseline -    Time 6    Period Weeks    Status New    Target Date 05/16/20      PT LONG TERM GOAL #4   Title Patient to demonstrate sit to stand and stand to sit transfers independently and safely    Baseline -    Time 6    Period Weeks    Status New    Target Date 05/16/20      PT LONG TERM GOAL #5   Title Functional status measure increased to 23 indicating improved functional activities and ADL's    Baseline -    Time 6    Period Weeks    Status New    Target Date 05/16/20                 Plan - 04/11/20 1405    Clinical Impression Statement Some improvement in pain and functional level. Surgeon said patient has some changes in disc at site of the fusion. Patient demonstrates increased exercises tolerance and has a better outlook on exercise and  functional activites    Rehab Potential Good    PT Frequency 2x / week    PT Duration  6 weeks    PT Treatment/Interventions ADLs/Self Care Home Management;Gait training;Stair training;Functional mobility training;Therapeutic activities;Therapeutic exercise;Balance training;Patient/family education;Manual techniques;Taping;Cryotherapy;Electrical Stimulation;Iontophoresis 4mg /ml Dexamethasone;Moist Heat;Ultrasound;Neuromuscular re-education;Dry needling    PT Next Visit Plan Progress HEP to tolerance focusing on core stabilization; LE strengthening; improving functional mobility; gait training.    PT Home Exercise Plan [place] and Agree with Plan of Care Patient           Patient will benefit from skilled therapeutic intervention in order to improve the following deficits and impairments:     Visit Diagnosis: Muscle weakness (generalized)  Other abnormalities of gait and mobility  Acute low back pain with sciatica, sciatica laterality unspecified, unspecified back pain laterality  Unsteadiness on feet     Problem List Patient Active Problem List   Diagnosis Date Noted  . Lumbar herniated disc 02/01/2020  . Lumbar radiculopathy 05/22/2019  . Spastic gait 03/07/2019  . Myalgia 11/01/2018  . Chronic bilateral low back pain without sciatica 07/21/2018  . 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 CIDP (chronic inflammatory demyelinating polyneuropathy) ([place]) 12/30/2017  . Edema 03/01/2017  . Hypokalemia   . Slow transit constipation   . Steroid-induced hyperglycemia   . Urinary retention   . Accidental drug overdose   . Postoperative pain   . Hyponatremia   . Cervical myelopathy ([place]) 02/01/2017  . Shortness of breath at rest   . Hypoglycemia   . Spondylosis, cervical, with myelopathy   . Neuropathic pain   . Benign essential HTN   . Labile blood glucose   . Muscle spasm   . GAD (generalized anxiety disorder)   . Acute inflammatory demyelinating  polyneuropathy ([place]) 01/11/2017  . Anxiety state   . Neurogenic bladder   . Depression 12/30/2016  . Leg weakness, bilateral 12/30/2016  . 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 GBS (Guillain Barre syndrome) ([place])   . AIDP (acute inflammatory demyelinating polyneuropathy) ([place]) 11/27/2016  . Weakness 11/20/2016  . Weakness of both arms 10/30/2016  . 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 Obstructive sleep apnea 03/24/2016  . Numbness 03/18/2016  . Mild nonproliferative diabetic retinopathy of both eyes without macular edema associated with type 2 diabetes mellitus ([place]) 05/24/2015  . Nuclear cataract of both eyes 05/24/2015  . Diabetes mellitus without complication ([place]) 14/43/1540  . 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 Eustachian tube dysfunction 02/12/2015  . Acute maxillary sinusitis 02/12/2015  . Wellness examination 08/22/2014  . Alkaline phosphatase elevation 08/22/2014  . 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 Benign non-nodular prostatic hyperplasia with lower urinary tract symptoms 01/31/2014  . Lesion of eyelid 09/26/2013  . Screening for prostate cancer 04/24/2013  . Diabetes ([place]) 06/16/2012  . ED (erectile dysfunction) of organic origin 06/03/2012  . Routine general medical examination at a health care facility 04/10/2012  . BPH (benign prostatic hyperplasia) 02/03/2010  . HEMOCCULT POSITIVE STOOL 05/25/2008  . ELBOW PAIN, RIGHT 07/04/2007  . Dyslipidemia 02/16/2007  . ANXIETY 02/16/2007  . ERECTILE DYSFUNCTION 02/16/2007  . Essential hypertension 02/16/2007  . TESTICULAR MASS, LEFT 02/16/2007  . KNEE PAIN, CHRONIC 02/16/2007  . Michell Heinrich OF 02/16/2007    Coston Mandato Nilda Simmer PT, MPH  04/11/2020, 2:37 PM  [facility] [place], [place], [address] Phone: [phone]   Fax:  [phone]  Name: RODGERICK GILLIAND MRN:  [id number] Date of Birth: 03/17/1950

## 2020-04-15 ENCOUNTER — Ambulatory Visit (INDEPENDENT_AMBULATORY_CARE_PROVIDER_SITE_OTHER): Payer: Medicare Other | Admitting: Rehabilitative and Restorative Service Providers"

## 2020-04-15 ENCOUNTER — Encounter: Payer: Self-pay | Admitting: Rehabilitative and Restorative Service Providers"

## 2020-04-15 ENCOUNTER — Other Ambulatory Visit: Payer: Self-pay

## 2020-04-15 ENCOUNTER — Encounter: Payer: Self-pay | Admitting: Neurology

## 2020-04-15 ENCOUNTER — Ambulatory Visit (INDEPENDENT_AMBULATORY_CARE_PROVIDER_SITE_OTHER): Payer: Medicare Other | Admitting: Neurology

## 2020-04-15 VITALS — BP 144/72 | HR 85 | Ht 72.0 in | Wt 219.0 lb

## 2020-04-15 DIAGNOSIS — R2689 Other abnormalities of gait and mobility: Secondary | ICD-10-CM

## 2020-04-15 DIAGNOSIS — M5440 Lumbago with sciatica, unspecified side: Secondary | ICD-10-CM

## 2020-04-15 DIAGNOSIS — R2681 Unsteadiness on feet: Secondary | ICD-10-CM

## 2020-04-15 DIAGNOSIS — M6281 Muscle weakness (generalized): Secondary | ICD-10-CM

## 2020-04-15 DIAGNOSIS — G6181 Chronic inflammatory demyelinating polyneuritis: Secondary | ICD-10-CM | POA: Diagnosis not present

## 2020-04-15 NOTE — Progress Notes (Signed)
Follow-up Visit   Date: 04/15/20   Kenneth Pace MRN: [id number] DOB: 02-Sep-1950   Interim History: Kenneth Pace is a 70 y.o. right-handed male with insulin-dependent diabetes mellitus, hypertension, cervical myelopathy s/p decompression at C3-C6, and s/p L4 laminectomy (05/2019) returning to the clinic for follow-up of CIDP  The patient was accompanied to the clinic by self.  He remains on IVIG every 6 weeks and feels that he continues to get good benefits with it.  He has noticed that around 5 weeks, he may trip more or drop objects.  His bigger issue is ongoing back pain and leg weakness which started in December. In February, he underwent L4-5 PLIF and has severe pain, bilateral leg weakness, and severe constipation, which led him to go to ER last week.  He is unable to raise his legs, such as when trying to get in and out of a car or climb stairs, and has to manually lift the legs to transfer.     Medications:  Current Outpatient Medications on File Prior to Visit  Medication Sig Dispense Refill  . acetaminophen (TYLENOL) 500 MG tablet Take 500 mg by mouth every 6 (six) hours as needed for mild pain.    Marland Kitchen amitriptyline (ELAVIL) 75 MG tablet TAKE 1 TABLET(75 MG) BY MOUTH AT BEDTIME (Patient taking differently: Take 75 mg by mouth at bedtime.) 30 tablet 2  . aspirin EC 81 MG tablet Take 1 tablet (81 mg total) by mouth daily. Swallow whole. 30 tablet 11  . B Complex Vitamins (B-COMPLEX/B-12 PO) Take 1 tablet by mouth at bedtime.    . baclofen (LIORESAL) 10 MG tablet Take 2 tablets (20 mg total) by mouth 3 (three) times daily. (Patient taking differently: Take 20-30 mg by mouth 3 (three) times daily.) 540 tablet 3  . celecoxib (CELEBREX) 100 MG capsule TAKE 1 CAPSULE(100 MG) BY MOUTH AT BEDTIME (Patient taking differently: Take 100 mg by mouth at bedtime.) 30 capsule 2  . clotrimazole-betamethasone (LOTRISONE) cream Apply 1 application topically 2 (two) times daily. (Patient  taking differently: Apply 1 application topically daily as needed (Dry skin).) 90 g 2  . DULoxetine (CYMBALTA) 60 MG capsule TAKE 1 CAPSULE DAILY 90 capsule 3  . finasteride (PROSCAR) 5 MG tablet Take 1 tablet (5 mg total) by mouth at bedtime. 90 tablet 2  . glucagon 1 MG injection Inject 1 mg into the vein once as needed. 1 each 12  . glucose blood (BAYER CONTOUR NEXT TEST) test strip MEDICALLY NECESSARY FOR USE WITH PUMP; Use to check blood sugar 5 times per day and prn; E11.42 500 each 3  . Immune Globulin 10% (IMMUNE GLOBULIN 10%) 10G/111mL (10,000mg /160mL) SOLN Inject 105 g into the vein every 6 (six) weeks. 289.5 mL 10  . Insulin Human (INSULIN PUMP) SOLN Inject 120 each into the skin daily. HUMALOG    . Insulin Infusion Pump Supplies (PARADIGM RESERVOIR 3ML) MISC 1 Device by Does not apply route every 3 (three) days. 30 each 3  . Insulin Infusion Pump Supplies (QUICK-SET INFUSION 43" 9MM) MISC 1 Device by Does not apply route every 3 (three) days. 30 each 3  . insulin lispro (HUMALOG) 100 UNIT/ML injection INJECT 120 UNITS DAILY IN PUMP 120 mL 3  . lidocaine (LIDODERM) 5 % At 7 am and remove at 7 pm. Apply 1 on each side. (Patient taking differently: Place 2 patches onto the skin daily as needed (pain).) 180 patch 2  . lisinopril-hydrochlorothiazide (ZESTORETIC) 10-12.5 MG tablet  Take 1 tablet by mouth at bedtime. 90 tablet 3  . methocarbamol (ROBAXIN-750) 750 MG tablet Take 1 tablet (750 mg total) by mouth 3 (three) times daily as needed for muscle spasms. 90 tablet 1  . MOVANTIK 12.5 MG TABS tablet Take by mouth daily.    . Multiple Vitamin (MULTIVITAMIN) capsule Take 1 capsule by mouth at bedtime.    Marland Kitchen oxyCODONE-acetaminophen (PERCOCET) 7.5-325 MG tablet Take 1 tablet by mouth every 4 (four) hours as needed for severe pain. 60 tablet 0  . pseudoephedrine (SUDAFED) 30 MG tablet Take 30 mg by mouth at bedtime as needed for congestion.    . Cranberry 400 MG TABS Take 400 mg by mouth at  bedtime. (Patient not taking: Reported on 04/15/2020)    . oxyCODONE (ROXICODONE) 5 MG immediate release tablet Take 1-2 tablets (5-10 mg total) by mouth every 4 (four) hours as needed. (Patient not taking: Reported on 04/15/2020) 60 tablet 0  . scopolamine (TRANSDERM-SCOP) 1 MG/3DAYS Place 1 patch (1.5 mg total) onto the skin every 3 (three) days. (Patient not taking: Reported on 04/15/2020) 10 patch 12  . traMADol (ULTRAM) 50 MG tablet Take 50 mg by mouth every 6 (six) hours as needed (mild pain). (Patient not taking: Reported on 04/15/2020)     No current facility-administered medications on file prior to visit.    Allergies: No Known Allergies    Vital Signs:  BP (!) 144/72   Pulse 85   Ht 6' (1.829 m)   Wt 219 lb (99.3 kg)   SpO2 96%   BMI 29.70 kg/m   Neurological Exam: MENTAL STATUS including orientation to time, place, person, recent and remote memory, attention span and concentration, language, and fund of knowledge is normal.  Speech is not dysarthric.  CRANIAL NERVES: Normal conjugate, extra-ocular eye movements in all directions of gaze.  No ptosis.   MOTOR:  There is moderate right FDI and ADM atrophy.  No fasciculations or abnormal movements.  No pronator drift.    Right Upper Extremity:    Left Upper Extremity:    Deltoid  5/5   Deltoid  5/5   Biceps  5/5   Biceps  5/5   Triceps  5/5   Triceps  5/5   Wrist extensors  5/5   Wrist extensors  5/5   Wrist flexors  5/5   Wrist flexors  5/5   Finger extensors  5/5   Finger extensors  5/5   Finger flexors  5/5   Finger flexors  5/5   Dorsal interossei  5-/5   Dorsal interossei  5/5   Abductor pollicis  5/5   Abductor pollicis  5/5   Tone (Ashworth scale)  0  Tone (Ashworth scale)  0   Right Lower Extremity:    Left Lower Extremity:    Hip flexors  2/5   Hip flexors  2/5   Hip extensors  5/5   Hip extensors  5/5   Knee flexors  5/5   Knee flexors  5/5   Knee extensors  4/5   Knee extensors  4/5   Dorsiflexors  5/5    Dorsiflexors  5/5   Plantarflexors  5/5   Plantarflexors  5/5   Toe extensors  5/5   Toe extensors  5/5   Toe flexors  5/5   Toe flexors  5/5   Tone (Ashworth scale)  0  Tone (Ashworth scale)  0    MSRs:  Right  Left brachioradialis 2+  brachioradialis 2+  biceps 2+  biceps 2+  triceps 2+  triceps 2+  patellar 1+  patellar 1+  ankle jerk 2+  ankle jerk 2+   SENSORY:  Vibration 100% at the knees, and 60% distal to ankles.    COORDINATION/GAIT: Gait is slow, assisted with walker, no significant spasticity is noted   Data: MRI cervical and lumbar spine 11/20/2016: 1. No focal cord signal abnormality or lesion. 2. Multilevel spondylosis of the cervical spine as described. 3. Moderate central and mild bilateral foraminal stenosis at C3-4. 4. Moderate central and bilateral foraminal narrowing at C4-5. 5. Moderate central and severe bilateral foraminal stenosis at C5-6. 6. Moderate foraminal narrowing bilaterally at C6-7 without significant central canal stenosis. 7. Short pedicles in the cervical and lumbar spine contribute to the stenosis. 8. Disc bulging at L2-3 and L3-4 without significant stenosis. 9. Disc bulging and facet hypertrophy at L4-5 with mild subarticular and foraminal stenosis bilaterally. 10. Mild left foraminal narrowing at L5-S1 secondary to asymmetric facet hypertrophy.  MRI lumbar spine wo contrast 05/07/2019:  1. New large right paracentral L4-5 disc extrusion with cranial extension within the subarticular recess above the L4-5 level and impinging the right L4 nerve root. 2. Mild progression of lumbar spondylosis at L3-L4 where there is mild left foraminal stenosis at L3-4. 3. Mild to moderate bilateral foraminal stenosis at L4-L5. 4. No canal stenosis at any level.  CSF 11/20/2016:  R1 W3 P81* G102*  No OCB, VDRL neg Labs 11/20/2016:  SPEP with IFE no M protein, 486, TSH 1.051  Lab Results   Component Value Date   HGBA1C 8.1 (A) 01/24/2020   MRI cervical spine 12/31/2016:   1. No enlargement of the cranial nerves or cervical nerve roots. 2. No acute intracranial abnormality. 3. Multilevel moderate cervical spinal canal stenosis, worst at C3-4 and C5-6 with associated mild cord deformity but no cord signal change. 4. Moderate to severe neural foraminal stenosis at C3-4, C4-5 and C5-6.  NCS/EMG of the right upper and lower extremities 04/27/2017: 1. 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  When compared to his previous study on 11/10/2016, there is mild interval improvement.  2. Chronic C6 radiculopathy affecting the right upper extremity, mild in degree electrically.  3. There is no evidence of a sensorimotor polyneuropathy or lumbosacral radiculopathy affecting the right lower extremity.    IMPRESSION/PLAN: 1.  Chronic inflammatory demyelinating polyradiculoneuropathy, diagnosed 2018.  His neuropathy has been stable and well-controlled on IVIG.  His leg weakness is secondary to lumbosacral radiculopathy, not neuropathy.   - Continue IVIG 1g/kg every 6 weeks  2.  Low back pain s/p L4-5 PLIF with severe weakness along the L4 myotome bilaterally, refractory pain which is followed by Dr. Kathyrn Sheriff  3.  Cervical myelopathy s/p decompression at C3-6 - no spasticity appreciated on today's exam  - Recommend tapering off baclofen by 10mg  every 3 days  Return to clinic in 6 months    Thank you for allowing me to participate in patient's care.  If I can answer any additional questions, I would be pleased to do so.    Sincerely,    Kayvon Mo K. Posey Pronto, DO

## 2020-04-15 NOTE — Therapy (Signed)
Kenneth Pace [place], [place], [address] Phone: [phone]   Fax:  [phone]  Physical Therapy Treatment  Patient Details  Name: Kenneth Pace MRN: [id number] Date of Birth: 11/30/1950 Referring Provider (PT): Consuella Lose, MD   Encounter Date: 04/15/2020   PT End of Session - 04/15/20 1149    Visit Number 4    Number of Visits 12    Date for PT Re-Evaluation 05/16/20    PT Start Time 4268    PT Stop Time 1230    PT Time Calculation (min) 45 min    Activity Tolerance Patient tolerated treatment well           Past Medical History:  Diagnosis Date  . ADENOIDECTOMY, HX OF 02/16/2007  . Anxiety    pt. states he does not have anxiety.  Kenneth Pace BENIGN PROSTATIC HYPERTROPHY, WITH OBSTRUCTION 02/03/2010  . Chronic inflammatory demyelinating polyneuropathy ([place])    diagnosed 11/2016  . DIABETES MELLITUS, TYPE I, UNCONTROLLED 12/29/2006  . ERECTILE DYSFUNCTION 02/16/2007  . Hearing loss    wears hearing aids  . HYPERLIPIDEMIA 02/16/2007  . HYPERTENSION 02/16/2007  . Nerve pain    per patient "on lower back and both legs"  . Sleep apnea    uses CPAP  . TESTICULAR MASS, LEFT 02/16/2007  . Therapeutic opioid-induced constipation (OIC)     Past Surgical History:  Procedure Laterality Date  . ANTERIOR CERVICAL DECOMP/DISCECTOMY FUSION N/A 02/01/2017   Procedure: ANTERIOR CERVICAL DECOMPRESSION/DISCECTOMY FUSION CERVICAL THREE-FOUR , CERVICAL FOUR-FIVE  CERVICAL FIVE-SIX;  Surgeon: Consuella Lose, MD;  Location: [facility];  Service: Neurosurgery;  Laterality: N/A;  . APPENDECTOMY    . BACK SURGERY    . COLONOSCOPY  02/05/2010   [facility]  . EYE SURGERY Right    Dr. Katy Fitch  . KNEE SURGERY     2 Lknee arthroscopy, 3 R knee arthroscpoy  . KNEE SURGERY Right 11/12/2016  . LUMBAR FUSION  03/26/2020  . LUMBAR LAMINECTOMY/DECOMPRESSION MICRODISCECTOMY Right 05/22/2019   Procedure: LAMINOTOMY AND  MICRODISCECTOMY RIGHT LUMBAR FOUR- LUMBAR FIVE;  Surgeon: Consuella Lose, MD;  Location: [facility];  Service: Neurosurgery;  Laterality: Right;  LAMINOTOMY AND MICRODISCECTOMY RIGHT LUMBAR FOUR- LUMBAR FIVE  . LUMBAR LAMINECTOMY/DECOMPRESSION MICRODISCECTOMY Right 02/01/2020   Procedure: REDO MICRODISCECTOMY RT LUMBAR FOUR-FIVE;  Surgeon: Consuella Lose, MD;  Location: [facility];  Service: Neurosurgery;  Laterality: Right;  posterior  . TONSILLECTOMY      There were no vitals filed for this visit.   Subjective Assessment - 04/15/20 1150    Subjective Patient reports that he overdid it last visit. It took him 2 days to recover. He can tell it was too much last visit.    Currently in Pain? Yes    Pain Score 7     Pain Location Back    Pain Orientation Right;Left    Pain Descriptors / Indicators Aching;Dull;Stabbing                             [facility] Adult PT Treatment/Exercise - 04/15/20 0001      Lumbar Exercises: Aerobic   Nustep L5 x 8 min U/LE's      Lumbar Exercises: Standing   Wall Slides Limitations marching in place with walker 20 reps each side    Other Standing Lumbar Exercises standing to work on posture; alignment; endurance 1-2 min x 2 reps    Other Standing Lumbar Exercises side steps  and backward steps at counter +1 HHA x 4 reps x 10 ft      Lumbar Exercises: Seated   Sit to Stand 5 reps    Sit to Stand Limitations from elevated surface - nustep seat using UE's for support as needed      Knee/Hip Exercises: Standing   Gait Training SLS with UE support with walker as needed for balance 10-20 sec x 2 reps each LE    Other Standing Knee Exercises working on standing and gait with walker - focus on posture and alignment as well as stepping without shuffling feet    Other Standing Knee Exercises overhead reach while standing x 10      Shoulder Exercises: Seated   Elevation Limitations W's x 10 VC to engage core    Retraction Strengthening;Both;10  reps;Theraband    Theraband Level (Shoulder Retraction) Level 2 (Red)    Row Strengthening;Both;10 reps;Theraband    Theraband Level (Shoulder Row) Level 2 (Red)    Other Seated Exercises W's x 2 VC to engage core                       PT Long Term Goals - 04/04/20 1327      PT LONG TERM GOAL #1   Title The patient will be independent with HEP.    Time 6    Period Weeks    Status New    Target Date 05/16/20      PT LONG TERM GOAL #2   Title The patient will improve LE strength to 4/5 to 4+/5 hip flexion, extension and abduction.    Baseline -    Time 6    Period Weeks    Status New    Target Date 05/16/20      PT LONG TERM GOAL #3   Title Patient to demonstrate safe independent gait with assistive device as indicated for household distances    Baseline -    Time 6    Period Weeks    Status New    Target Date 05/16/20      PT LONG TERM GOAL #4   Title Patient to demonstrate sit to stand and stand to sit transfers independently and safely    Baseline -    Time 6    Period Weeks    Status New    Target Date 05/16/20      PT LONG TERM GOAL #5   Title Functional status measure increased to 23 indicating improved functional activities and ADL's    Baseline -    Time 6    Period Weeks    Status New    Target Date 05/16/20                 Plan - 04/15/20 1308    Clinical Impression Statement Increased fatigue following last PT visit. Exercises were too much. Decreased exercise intensity with increased breaks for rest. Discussed with patient that he needs to let PT know when he is overdoing the exercises. He states that he does not know during the activities but notices afterward. Decreased intensity in session today and patient reported that the exercise today was tolerated well.    Rehab Potential Good    PT Frequency 2x / week    PT Duration 6 weeks    PT Treatment/Interventions ADLs/Self Care Home Management;Gait training;Stair  training;Functional mobility training;Therapeutic activities;Therapeutic exercise;Balance training;Patient/family education;Manual techniques;Taping;Cryotherapy;Electrical Stimulation;Iontophoresis 4mg /ml Dexamethasone;Moist Heat;Ultrasound;Neuromuscular re-education;Dry needling    PT  Next Visit Plan Progress HEP to tolerance focusing on core stabilization; LE strengthening; improving functional mobility; gait training.    PT Home Exercise Plan [place] and Agree with Plan of Care Patient           Patient will benefit from skilled therapeutic intervention in order to improve the following deficits and impairments:     Visit Diagnosis: Muscle weakness (generalized)  Other abnormalities of gait and mobility  Acute low back pain with sciatica, sciatica laterality unspecified, unspecified back pain laterality  Unsteadiness on feet     Problem List Patient Active Problem List   Diagnosis Date Noted  . Lumbar herniated disc 02/01/2020  . Lumbar radiculopathy 05/22/2019  . Spastic gait 03/07/2019  . Myalgia 11/01/2018  . Chronic bilateral low back pain without sciatica 07/21/2018  . 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 CIDP (chronic inflammatory demyelinating polyneuropathy) ([place]) 12/30/2017  . Edema 03/01/2017  . Hypokalemia   . Slow transit constipation   . Steroid-induced hyperglycemia   . Urinary retention   . Accidental drug overdose   . Postoperative pain   . Hyponatremia   . Cervical myelopathy ([place]) 02/01/2017  . Shortness of breath at rest   . Hypoglycemia   . Spondylosis, cervical, with myelopathy   . Neuropathic pain   . Benign essential HTN   . Labile blood glucose   . Muscle spasm   . GAD (generalized anxiety disorder)   . Acute inflammatory demyelinating polyneuropathy ([place]) 01/11/2017  . Anxiety state   . Neurogenic bladder   . Depression 12/30/2016  . Leg weakness, bilateral 12/30/2016  . Chronic inflammatory demyelinating  polyradiculoneuropathy ([employer]) 12/30/2016  . GBS (Guillain Barre syndrome) ([place])   . AIDP (acute inflammatory demyelinating polyneuropathy) ([place]) 11/27/2016  . Weakness 11/20/2016  . Weakness of both arms 10/30/2016  . 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 Obstructive sleep apnea 03/24/2016  . Numbness 03/18/2016  . 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 Nuclear cataract of both eyes 05/24/2015  . Diabetes mellitus without complication ([place]) 71/69/6789  . 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 Eustachian tube dysfunction 02/12/2015  . Acute maxillary sinusitis 02/12/2015  . Wellness examination 08/22/2014  . Alkaline phosphatase elevation 08/22/2014  . 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 Benign non-nodular prostatic hyperplasia with lower urinary tract symptoms 01/31/2014  . Lesion of eyelid 09/26/2013  . Screening for prostate cancer 04/24/2013  . Diabetes ([place]) 06/16/2012  . ED (erectile dysfunction) of organic origin 06/03/2012  . Routine general medical examination at a health care facility 04/10/2012  . BPH (benign prostatic hyperplasia) 02/03/2010  . HEMOCCULT POSITIVE STOOL 05/25/2008  . ELBOW PAIN, RIGHT 07/04/2007  . Dyslipidemia 02/16/2007  . ANXIETY 02/16/2007  . ERECTILE DYSFUNCTION 02/16/2007  . Essential hypertension 02/16/2007  . TESTICULAR MASS, LEFT 02/16/2007  . KNEE PAIN, CHRONIC 02/16/2007  . Michell Heinrich OF 02/16/2007    Raziel Koenigs Nilda Simmer PT, MPH  04/15/2020, 1:13 PM  [facility] [place], [place], [address] Phone: [phone]   Fax:  [phone]  Name: AMRIT CRESS MRN: [id number] Date of Birth: 05-09-1950

## 2020-04-15 NOTE — Patient Instructions (Addendum)
Continue IVIG every 6 weeks  Reduce baclofen by 10mg  daily every 3 days, until stopped.  Return to clinic in 4 months

## 2020-04-18 ENCOUNTER — Other Ambulatory Visit: Payer: Self-pay

## 2020-04-18 ENCOUNTER — Encounter: Payer: Self-pay | Admitting: Rehabilitative and Restorative Service Providers"

## 2020-04-18 ENCOUNTER — Ambulatory Visit (INDEPENDENT_AMBULATORY_CARE_PROVIDER_SITE_OTHER): Payer: Medicare Other | Admitting: Rehabilitative and Restorative Service Providers"

## 2020-04-18 DIAGNOSIS — M5440 Lumbago with sciatica, unspecified side: Secondary | ICD-10-CM | POA: Diagnosis not present

## 2020-04-18 DIAGNOSIS — R2689 Other abnormalities of gait and mobility: Secondary | ICD-10-CM

## 2020-04-18 DIAGNOSIS — M6281 Muscle weakness (generalized): Secondary | ICD-10-CM | POA: Diagnosis not present

## 2020-04-18 DIAGNOSIS — R2681 Unsteadiness on feet: Secondary | ICD-10-CM

## 2020-04-18 NOTE — Therapy (Signed)
[facility], [place], [address] Phone: [phone]   Fax:  [phone]  Physical Therapy Treatment  Patient Details  Name: Kenneth Pace MRN: [id number] Date of Birth: [birth year] Referring Provider (PT): Consuella Lose, MD   Encounter Date: 04/18/2020   PT End of Session - 04/18/20 1402    Visit Number 5    Number of Visits 12    Date for PT Re-Evaluation 05/16/20    PT Start Time 5643    PT Stop Time 1436    PT Time Calculation (min) 38 min    Activity Tolerance Patient tolerated treatment well           Past Medical History:  Diagnosis Date  . ADENOIDECTOMY, HX OF 02/16/2007  . Anxiety    pt. states he does not have anxiety.  Marland Kitchen BENIGN PROSTATIC HYPERTROPHY, WITH OBSTRUCTION 02/03/2010  . 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 DIABETES MELLITUS, TYPE I, UNCONTROLLED 12/29/2006  . ERECTILE DYSFUNCTION 02/16/2007  . Hearing loss    wears hearing aids  . HYPERLIPIDEMIA 02/16/2007  . HYPERTENSION 02/16/2007  . Nerve pain    per patient "on lower back and both legs"  . Sleep apnea    uses CPAP  . TESTICULAR MASS, LEFT 02/16/2007  . Therapeutic opioid-induced constipation (OIC)     Past Surgical History:  Procedure Laterality Date  . ANTERIOR CERVICAL DECOMP/DISCECTOMY FUSION N/A 02/01/2017   Procedure: ANTERIOR CERVICAL DECOMPRESSION/DISCECTOMY FUSION CERVICAL THREE-FOUR , CERVICAL FOUR-FIVE  CERVICAL FIVE-SIX;  Surgeon: Consuella Lose, MD;  Location: [facility];  Service: Neurosurgery;  Laterality: N/A;  . APPENDECTOMY    . BACK SURGERY    . COLONOSCOPY  02/05/2010   [facility]  . EYE SURGERY Right    Dr. Katy Fitch  . KNEE SURGERY     2 Lknee arthroscopy, 3 R knee arthroscpoy  . KNEE SURGERY Right 11/12/2016  . LUMBAR FUSION  03/26/2020  . LUMBAR LAMINECTOMY/DECOMPRESSION MICRODISCECTOMY Right 05/22/2019   Procedure: LAMINOTOMY AND  MICRODISCECTOMY RIGHT LUMBAR FOUR- LUMBAR FIVE;  Surgeon: Consuella Lose, MD;  Location: [facility];  Service: Neurosurgery;  Laterality: Right;  LAMINOTOMY AND MICRODISCECTOMY RIGHT LUMBAR FOUR- LUMBAR FIVE  . LUMBAR LAMINECTOMY/DECOMPRESSION MICRODISCECTOMY Right 02/01/2020   Procedure: REDO MICRODISCECTOMY RT LUMBAR FOUR-FIVE;  Surgeon: Consuella Lose, MD;  Location: [facility];  Service: Neurosurgery;  Laterality: Right;  posterior  . TONSILLECTOMY      There were no vitals filed for this visit.   Subjective Assessment - 04/18/20 1403    Subjective Wife reports that Kenneth Pace is having trouble getting in and out of the car.    Currently in Pain? Yes    Pain Score 5     Pain Location Back    Pain Orientation Right;Left    Pain Descriptors / Indicators Aching;Dull;Stabbing    Pain Type Chronic pain;Surgical pain                             [facility] Adult PT Treatment/Exercise - 04/18/20 0001      Transfers   Comments worked on getting in and out of [place] safely with assist for lifting Rt LE into [place] - worked with patient and wift on safe transfers and discussed transfer in and out of higher truck using stool      Ambulation/Gait   Gait Comments ambulation with walker x 400 ft negotiating ramps and parking lot; automatic  doors +1 contact guard to SBA doe safety      Lumbar Exercises: Aerobic   Nustep L5 x 7 min U/LE's      Lumbar Exercises: Standing   Shoulder Adduction Limitations standing in walker working on knee bends holding blue TB in hands working on hip/knee ext against TB resistance x 15 reps    Other Standing Lumbar Exercises standing to work on posture; alignment; endurance 1-2 min x 2 reps      Knee/Hip Exercises: Standing   Stairs workingon stepping up 6 inch step backward UE assist w/ rail and w/ walker - 8 reps leading with Lt LE; 3 Rt    Other Standing Knee Exercises working on standing and gait with walker - focus on posture and alignment as well as  stepping without shuffling feet                  PT Education - 04/18/20 1443    Education Details working on transfers in and out of [place] for pt and wife on safe transfers in and out of [place] and truck which is higher than [place]; provided handout for assist for leg lift    Person(s) Educated Patient;Spouse    Methods Explanation;Demonstration;Tactile cues;Verbal cues    Comprehension Verbalized understanding;Returned demonstration;Verbal cues required;Tactile cues required               PT Long Term Goals - 04/04/20 1327      PT LONG TERM GOAL #1   Title The patient will be independent with HEP.    Time 6    Period Weeks    Status New    Target Date 05/16/20      PT LONG TERM GOAL #2   Title The patient will improve LE strength to 4/5 to 4+/5 hip flexion, extension and abduction.    Baseline -    Time 6    Period Weeks    Status New    Target Date 05/16/20      PT LONG TERM GOAL #3   Title Patient to demonstrate safe independent gait with assistive device as indicated for household distances    Baseline -    Time 6    Period Weeks    Status New    Target Date 05/16/20      PT LONG TERM GOAL #4   Title Patient to demonstrate sit to stand and stand to sit transfers independently and safely    Baseline -    Time 6    Period Weeks    Status New    Target Date 05/16/20      PT LONG TERM GOAL #5   Title Functional status measure increased to 23 indicating improved functional activities and ADL's    Baseline -    Time 6    Period Weeks    Status New    Target Date 05/16/20                 Plan - 04/18/20 1444    Clinical Impression Statement Patient and wife leave tomorrow for cruise next week. They will drive 10 hours tomorrow and then get on the cruise ship. Discussed and practiced transfers in and out of personal vehicle and mobilty on board ship. Patient continues to demonstrate increased functional strength and gait safety.    Rehab  Potential Good    PT Frequency 2x / week    PT Duration 6 weeks    PT Treatment/Interventions  ADLs/Self Care Home Management;Gait training;Stair training;Functional mobility training;Therapeutic activities;Therapeutic exercise;Balance training;Patient/family education;Manual techniques;Taping;Cryotherapy;Electrical Stimulation;Iontophoresis 4mg /ml Dexamethasone;Moist Heat;Ultrasound;Neuromuscular re-education;Dry needling    PT Next Visit Plan Progress HEP to tolerance focusing on core stabilization; LE strengthening; improving functional mobility; gait training.    PT Home Exercise Plan [place] and Agree with Plan of Care Patient           Patient will benefit from skilled therapeutic intervention in order to improve the following deficits and impairments:     Visit Diagnosis: Muscle weakness (generalized)  Other abnormalities of gait and mobility  Acute low back pain with sciatica, sciatica laterality unspecified, unspecified back pain laterality  Unsteadiness on feet     Problem List Patient Active Problem List   Diagnosis Date Noted  . Lumbar herniated disc 02/01/2020  . Lumbar radiculopathy 05/22/2019  . Spastic gait 03/07/2019  . Myalgia 11/01/2018  . Chronic bilateral low back pain without sciatica 07/21/2018  . 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 CIDP (chronic inflammatory demyelinating polyneuropathy) ([place]) 12/30/2017  . Edema 03/01/2017  . Hypokalemia   . Slow transit constipation   . Steroid-induced hyperglycemia   . Urinary retention   . Accidental drug overdose   . Postoperative pain   . Hyponatremia   . Cervical myelopathy ([place]) 02/01/2017  . Shortness of breath at rest   . Hypoglycemia   . Spondylosis, cervical, with myelopathy   . Neuropathic pain   . Benign essential HTN   . Labile blood glucose   . Muscle spasm   . GAD (generalized anxiety disorder)   . Acute inflammatory demyelinating polyneuropathy ([place]) 01/11/2017  . Anxiety  state   . Neurogenic bladder   . Depression 12/30/2016  . Leg weakness, bilateral 12/30/2016  . 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 GBS (Guillain Barre syndrome) ([place])   . AIDP (acute inflammatory demyelinating polyneuropathy) ([place]) 11/27/2016  . Weakness 11/20/2016  . Weakness of both arms 10/30/2016  . 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 Obstructive sleep apnea 03/24/2016  . Numbness 03/18/2016  . Mild nonproliferative diabetic retinopathy of both eyes without macular edema associated with type 2 diabetes mellitus ([place]) 05/24/2015  . Nuclear cataract of both eyes 05/24/2015  . Diabetes mellitus without complication ([place]) 15/17/6160  . 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 Eustachian tube dysfunction 02/12/2015  . Acute maxillary sinusitis 02/12/2015  . Wellness examination 08/22/2014  . Alkaline phosphatase elevation 08/22/2014  . 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 Benign non-nodular prostatic hyperplasia with lower urinary tract symptoms 01/31/2014  . Lesion of eyelid 09/26/2013  . Screening for prostate cancer 04/24/2013  . Diabetes ([place]) 06/16/2012  . ED (erectile dysfunction) of organic origin 06/03/2012  . Routine general medical examination at a health care facility 04/10/2012  . BPH (benign prostatic hyperplasia) 02/03/2010  . HEMOCCULT POSITIVE STOOL 05/25/2008  . ELBOW PAIN, RIGHT 07/04/2007  . Dyslipidemia 02/16/2007  . ANXIETY 02/16/2007  . ERECTILE DYSFUNCTION 02/16/2007  . Essential hypertension 02/16/2007  . TESTICULAR MASS, LEFT 02/16/2007  . KNEE PAIN, CHRONIC 02/16/2007  . Michell Heinrich OF 02/16/2007    Chrys Landgrebe Nilda Simmer PT, MPH 04/18/2020, 2:47 PM  [facility] [place], [place], [address] Phone: [phone]   Fax:  [phone]  Name: Kenneth Pace MRN: [id number] Date of Birth: April 06, 1950

## 2020-04-30 ENCOUNTER — Encounter: Payer: BLUE CROSS/BLUE SHIELD | Admitting: Rehabilitative and Restorative Service Providers"

## 2020-05-02 ENCOUNTER — Other Ambulatory Visit: Payer: Self-pay

## 2020-05-02 ENCOUNTER — Encounter: Payer: Self-pay | Admitting: Rehabilitative and Restorative Service Providers"

## 2020-05-02 ENCOUNTER — Ambulatory Visit (INDEPENDENT_AMBULATORY_CARE_PROVIDER_SITE_OTHER): Payer: Medicare Other | Admitting: Rehabilitative and Restorative Service Providers"

## 2020-05-02 DIAGNOSIS — R2681 Unsteadiness on feet: Secondary | ICD-10-CM

## 2020-05-02 DIAGNOSIS — M5440 Lumbago with sciatica, unspecified side: Secondary | ICD-10-CM | POA: Diagnosis not present

## 2020-05-02 DIAGNOSIS — R2689 Other abnormalities of gait and mobility: Secondary | ICD-10-CM | POA: Diagnosis not present

## 2020-05-02 DIAGNOSIS — M6281 Muscle weakness (generalized): Secondary | ICD-10-CM | POA: Diagnosis present

## 2020-05-02 NOTE — Patient Instructions (Signed)
Access Code: [email or web address]: [email or web address]: 03/17/2022Prepared by: Nickey Kloepfer HoltProgram Notes *Exercise should not be painful. When you are stretching, you will feel a gentle pull, but it should not hurt in your back. Exercises  Supine Hamstring Stretch with Strap - 1 x daily - 7 x weekly - 1 sets - 3 reps - 30 seconds hold  Sidelying Clamshell in Neutral - 1 x daily - 7 x weekly - 1 sets - 10 reps  Gastroc Stretch on Wall - 1 x daily - 7 x weekly - 1 sets - 3 reps - 30 seconds hold  Supine Hip Adduction Isometric with Ball - 2 x daily - 7 x weekly - 1-3 sets - 10 reps - 10 sec hold  Hooklying Isometric Clamshell - 2 x daily - 7 x weekly - 1-3 sets - 10 reps - 3 sec hold  Hip Flexion - 2 x daily - 7 x weekly - 1 sets - 10 reps - 2-3 sec hold

## 2020-05-02 NOTE — Therapy (Signed)
[facility] [place], [place], [address] Phone: [phone]   Fax:  [phone]  Physical Therapy Treatment  Patient Details  Name: Kenneth Pace MRN: [id number] Date of Birth: 07-Oct-1950 Referring Provider (PT): Consuella Lose, MD   Encounter Date: 05/02/2020   PT End of Session - 05/02/20 1012    Visit Number 6    Number of Visits 12    Date for PT Re-Evaluation 05/16/20    PT Start Time 1009    PT Stop Time 1100    PT Time Calculation (min) 51 min    Activity Tolerance Patient tolerated treatment well           Past Medical History:  Diagnosis Date  . ADENOIDECTOMY, HX OF 02/16/2007  . Anxiety    pt. states he does not have anxiety.  Marland Kitchen BENIGN PROSTATIC HYPERTROPHY, WITH OBSTRUCTION 02/03/2010  . 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 DIABETES MELLITUS, TYPE I, UNCONTROLLED 12/29/2006  . ERECTILE DYSFUNCTION 02/16/2007  . Hearing loss    wears hearing aids  . HYPERLIPIDEMIA 02/16/2007  . HYPERTENSION 02/16/2007  . Nerve pain    per patient "on lower back and both legs"  . Sleep apnea    uses CPAP  . TESTICULAR MASS, LEFT 02/16/2007  . Therapeutic opioid-induced constipation (OIC)     Past Surgical History:  Procedure Laterality Date  . ANTERIOR CERVICAL DECOMP/DISCECTOMY FUSION N/A 02/01/2017   Procedure: ANTERIOR CERVICAL DECOMPRESSION/DISCECTOMY FUSION CERVICAL THREE-FOUR , CERVICAL FOUR-FIVE  CERVICAL FIVE-SIX;  Surgeon: Consuella Lose, MD;  Location: [facility];  Service: Neurosurgery;  Laterality: N/A;  . APPENDECTOMY    . BACK SURGERY    . COLONOSCOPY  02/05/2010   [facility]  . EYE SURGERY Right    Dr. Katy Fitch  . KNEE SURGERY     2 Lknee arthroscopy, 3 R knee arthroscpoy  . KNEE SURGERY Right 11/12/2016  . LUMBAR FUSION  03/26/2020  . LUMBAR LAMINECTOMY/DECOMPRESSION MICRODISCECTOMY Right 05/22/2019   Procedure: LAMINOTOMY AND  MICRODISCECTOMY RIGHT LUMBAR FOUR- LUMBAR FIVE;  Surgeon: Consuella Lose, MD;  Location: [facility];  Service: Neurosurgery;  Laterality: Right;  LAMINOTOMY AND MICRODISCECTOMY RIGHT LUMBAR FOUR- LUMBAR FIVE  . LUMBAR LAMINECTOMY/DECOMPRESSION MICRODISCECTOMY Right 02/01/2020   Procedure: REDO MICRODISCECTOMY RT LUMBAR FOUR-FIVE;  Surgeon: Consuella Lose, MD;  Location: [facility];  Service: Neurosurgery;  Laterality: Right;  posterior  . TONSILLECTOMY      There were no vitals filed for this visit.   Subjective Assessment - 05/02/20 1013    Subjective Patient managed okay on the cruise with no additiional problems. Pain level is no better. Patient continues to have weakness Rt > Lt LE's; difficulty with functional activities.              [facility] PT Assessment - 05/02/20 0001      Assessment   Medical Diagnosis L4-L5 discectomy/lumbar fusion    Referring Provider (PT) Consuella Lose, MD    Onset Date/Surgical Date 03/26/20    Hand Dominance Right    Next MD Visit 05/08/20    Prior Therapy known to our clinic from prior PT      Restrictions   Weight Bearing Restrictions No      Strength   Right/Left Hip --   strength assessed in supine due to pain and difficulty turning side to side - unable to lie prone   Right Hip Flexion 3/5    Right Hip Extension 3+/5  Right Hip ABduction 4/5    Right Hip ADduction 4+/5    Left Hip Flexion 3+/5    Left Hip Extension 4-/5    Left Hip ABduction 4/5    Left Hip ADduction 4+/5      Transfers   Five time sit to stand comments  sit to stand elevated surface(28-29 inches) w/ walker for UE assist 34 sec    Comments sit to supine and supine to sit independently; slowly w/ VC      Ambulation/Gait   Ambulation/Gait Assistance 5: Supervision    Ambulation Distance (Feet) 80 Feet    Assistive device Rolling walker    Gait Pattern Step-through pattern;Decreased stride length;Right foot flat;Shuffle;Antalgic;Decreased trunk rotation;Trunk flexed     Ambulation Surface Level    Gait Comments work on standing unsupported      Balance   Balance Assessed --   standing unsupported 2 min                        [facility] Adult PT Treatment/Exercise - 05/02/20 0001      Lumbar Exercises: Aerobic   Nustep L5 x 7 min U/LE's      Lumbar Exercises: Supine   Clam 10 reps;3 seconds   blue TB distal thigh   Bent Knee Raise 10 reps;2 seconds;3 seconds   assisting with UE   Bridge Limitations unable to accomplish due to pain    Other Supine Lumbar Exercises ball squeeze 3 sec x 10 reps hooklying                  PT Education - 05/02/20 1049    Education Details HEP    Person(s) Educated Patient    Methods Explanation;Demonstration;Tactile cues;Verbal cues;Handout    Comprehension Verbalized understanding;Returned demonstration;Verbal cues required;Tactile cues required               PT Long Term Goals - 05/02/20 1017      PT LONG TERM GOAL #1   Title The patient will be independent with HEP.    Time 6    Period Weeks    Status On-going      PT LONG TERM GOAL #2   Title The patient will improve LE strength to 4/5 to 4+/5 hip flexion, extension and abduction.    Time 6    Period Weeks    Status On-going      PT LONG TERM GOAL #3   Title Patient to demonstrate safe independent gait with assistive device as indicated for household distances    Time 6    Period Weeks    Status Partially Met      PT LONG TERM GOAL #4   Title Patient to demonstrate sit to stand and stand to sit transfers independently and safely    Time 6    Period Weeks    Status Partially Met      PT LONG TERM GOAL #5   Title Functional status measure increased to 23 indicating improved functional activities and ADL's    Time 6    Period Weeks    Status On-going                 Plan - 05/02/20 1015    Clinical Impression Statement Patient is walking some with cane at home. He continues to use the rolling walker for  distances and when outside of the home. He continues to have decreased strength bilat LE's; difficulty with transitional movements;  abnormal and unsteady gait. Pain persists and is unchanged. Kenneth Pace continues to wear lumbar support except at night when sleeping. Patient will benefit from continued treatment to address problems identified and work toward stated goals of therapy.    Rehab Potential Good    PT Frequency 2x / week    PT Duration 6 weeks    PT Treatment/Interventions ADLs/Self Care Home Management;Gait training;Stair training;Functional mobility training;Therapeutic activities;Therapeutic exercise;Balance training;Patient/family education;Manual techniques;Taping;Cryotherapy;Electrical Stimulation;Iontophoresis 80m/ml Dexamethasone;Moist Heat;Ultrasound;Neuromuscular re-education;Dry needling    PT Next Visit Plan Progress HEP to tolerance focusing on core stabilization; LE strengthening; improving functional mobility; gait training.    PT Home Exercise Plan [place] Agree with Plan of Care Patient           Patient will benefit from skilled therapeutic intervention in order to improve the following deficits and impairments:     Visit Diagnosis: Muscle weakness (generalized)  Other abnormalities of gait and mobility  Acute low back pain with sciatica, sciatica laterality unspecified, unspecified back pain laterality  Unsteadiness on feet     Problem List Patient Active Problem List   Diagnosis Date Noted  . Lumbar herniated disc 02/01/2020  . Lumbar radiculopathy 05/22/2019  . Spastic gait 03/07/2019  . Myalgia 11/01/2018  . Chronic bilateral low back pain without sciatica 07/21/2018  . 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 CIDP (chronic inflammatory demyelinating polyneuropathy) ([place] 12/30/2017  . Edema 03/01/2017  . Hypokalemia   . Slow transit constipation   . Steroid-induced hyperglycemia   . Urinary retention   . Accidental drug overdose    . Postoperative pain   . Hyponatremia   . Cervical myelopathy ([place] 02/01/2017  . Shortness of breath at rest   . Hypoglycemia   . Spondylosis, cervical, with myelopathy   . Neuropathic pain   . Benign essential HTN   . Labile blood glucose   . Muscle spasm   . GAD (generalized anxiety disorder)   . Acute inflammatory demyelinating polyneuropathy ([place] 01/11/2017  . Anxiety state   . Neurogenic bladder   . Depression 12/30/2016  . Leg weakness, bilateral 12/30/2016  . 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[place] 12/30/2016  . GBS (Guillain Barre syndrome) ([place]   . AIDP (acute inflammatory demyelinating polyneuropathy) ([place] 11/27/2016  . Weakness 11/20/2016  . Weakness of both arms 10/30/2016  . 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 Obstructive sleep apnea 03/24/2016  . Numbness 03/18/2016  . Mild nonproliferative diabetic retinopathy of both eyes without macular edema associated with type 2 diabetes mellitus ([place] 05/24/2015  . Nuclear cataract of both eyes 05/24/2015  . Diabetes mellitus without complication ([place] 083/15/1761 . 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 Eustachian tube dysfunction 02/12/2015  . Acute maxillary sinusitis 02/12/2015  . Wellness examination 08/22/2014  . Alkaline phosphatase elevation 08/22/2014  . 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 Benign non-nodular prostatic hyperplasia with lower urinary tract symptoms 01/31/2014  . Lesion of eyelid 09/26/2013  . Screening for prostate cancer 04/24/2013  . Diabetes ([place] 06/16/2012  . ED (erectile dysfunction) of organic origin 06/03/2012  . Routine general medical examination at a health care facility 04/10/2012  . BPH (benign prostatic hyperplasia) 02/03/2010  . HEMOCCULT POSITIVE STOOL 05/25/2008  . ELBOW PAIN, RIGHT 07/04/2007  . Dyslipidemia 02/16/2007  . ANXIETY 02/16/2007  . ERECTILE DYSFUNCTION 02/16/2007  . Essential hypertension 02/16/2007   . TESTICULAR MASS, LEFT 02/16/2007  . KNEE PAIN, CHRONIC 02/16/2007  . ADENOIDECTOMY, HX OF 02/16/2007  [place], MPH  05/02/2020, 10:50 AM  [facility] [place], [place], [address] Phone: [phone]   Fax:  [phone]  Name: Kenneth Pace MRN: [id number] Date of Birth: 1950-03-04

## 2020-05-06 ENCOUNTER — Ambulatory Visit (INDEPENDENT_AMBULATORY_CARE_PROVIDER_SITE_OTHER): Payer: Medicare Other | Admitting: Rehabilitative and Restorative Service Providers"

## 2020-05-06 ENCOUNTER — Encounter: Payer: Self-pay | Admitting: Rehabilitative and Restorative Service Providers"

## 2020-05-06 ENCOUNTER — Other Ambulatory Visit: Payer: Self-pay

## 2020-05-06 DIAGNOSIS — M5440 Lumbago with sciatica, unspecified side: Secondary | ICD-10-CM

## 2020-05-06 DIAGNOSIS — M6281 Muscle weakness (generalized): Secondary | ICD-10-CM | POA: Diagnosis present

## 2020-05-06 DIAGNOSIS — R2681 Unsteadiness on feet: Secondary | ICD-10-CM

## 2020-05-06 DIAGNOSIS — R2689 Other abnormalities of gait and mobility: Secondary | ICD-10-CM | POA: Diagnosis not present

## 2020-05-06 NOTE — Therapy (Signed)
[place], [place], [address] Phone: [phone]   Fax:  [phone]  Physical Therapy Treatment  Patient Details  Name: Kenneth Pace MRN: [id number] Date of Birth: May 05, 1950 Referring Provider (PT): Consuella Lose, MD   Encounter Date: 05/06/2020   PT End of Session - 05/06/20 1007    Visit Number 7    Number of Visits 12    Date for PT Re-Evaluation 05/16/20    PT Start Time 1007    PT Stop Time 1055    PT Time Calculation (min) 48 min    Activity Tolerance Patient tolerated treatment well           Past Medical History:  Diagnosis Date  . ADENOIDECTOMY, HX OF 02/16/2007  . Anxiety    pt. states he does not have anxiety.  Marland Kitchen BENIGN PROSTATIC HYPERTROPHY, WITH OBSTRUCTION 02/03/2010  . 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 DIABETES MELLITUS, TYPE I, UNCONTROLLED 12/29/2006  . ERECTILE DYSFUNCTION 02/16/2007  . Hearing loss    wears hearing aids  . HYPERLIPIDEMIA 02/16/2007  . HYPERTENSION 02/16/2007  . Nerve pain    per patient "on lower back and both legs"  . Sleep apnea    uses CPAP  . TESTICULAR MASS, LEFT 02/16/2007  . Therapeutic opioid-induced constipation (OIC)     Past Surgical History:  Procedure Laterality Date  . ANTERIOR CERVICAL DECOMP/DISCECTOMY FUSION N/A 02/01/2017   Procedure: ANTERIOR CERVICAL DECOMPRESSION/DISCECTOMY FUSION CERVICAL THREE-FOUR , CERVICAL FOUR-FIVE  CERVICAL FIVE-SIX;  Surgeon: Consuella Lose, MD;  Location: [facility];  Service: Neurosurgery;  Laterality: N/A;  . APPENDECTOMY    . BACK SURGERY    . COLONOSCOPY  02/05/2010   PATTERSON  . EYE SURGERY Right    Dr. Katy Fitch  . KNEE SURGERY     2 Lknee arthroscopy, 3 R knee arthroscpoy  . KNEE SURGERY Right 11/12/2016  . LUMBAR FUSION  03/26/2020  . LUMBAR LAMINECTOMY/DECOMPRESSION MICRODISCECTOMY Right 05/22/2019   Procedure: LAMINOTOMY AND  MICRODISCECTOMY RIGHT LUMBAR FOUR- LUMBAR FIVE;  Surgeon: Consuella Lose, MD;  Location: [facility];  Service: Neurosurgery;  Laterality: Right;  LAMINOTOMY AND MICRODISCECTOMY RIGHT LUMBAR FOUR- LUMBAR FIVE  . LUMBAR LAMINECTOMY/DECOMPRESSION MICRODISCECTOMY Right 02/01/2020   Procedure: REDO MICRODISCECTOMY RT LUMBAR FOUR-FIVE;  Surgeon: Consuella Lose, MD;  Location: [facility];  Service: Neurosurgery;  Laterality: Right;  posterior  . TONSILLECTOMY      There were no vitals filed for this visit.   Subjective Assessment - 05/06/20 1008    Subjective Patient reports that he is doing better today. He is walking mostly at home with the cane. He has been using the lumbar support less, taking it on and off for an hour or so during the day.    Currently in Pain? Yes    Pain Score 5     Pain Location Back    Pain Orientation Right;Left;Lower    Pain Descriptors / Indicators Aching;Dull    Pain Type Chronic pain;Surgical pain                             [facility] Adult PT Treatment/Exercise - 05/06/20 0001      Transfers   Five time sit to stand comments  sit to stand elevated surface(24-26 inches) w/out assistance      Ambulation/Gait   Ambulation/Gait Assistance 5: Supervision    Ambulation Distance (Feet) 320 Feet  Assistive device Straight cane    Gait Pattern Step-through pattern;Decreased stance time - right;Antalgic   ER of Rt > Lt LE   Ambulation Surface Level    Gait Comments work on standing unsupported; UE movement - arms overhead with dp breathing; forward punching alternating UE; shoulder abduction to 90 deg bilat      Lumbar Exercises: Aerobic   Nustep L5 x 7 min U/LE's      Lumbar Exercises: Seated   Sit to Stand 15 reps    Sit to Stand Limitations no UE assist    Other Seated Lumbar Exercises hip flexion sitting x 10 each LE propped back on UE's x 10      Knee/Hip Exercises: Standing   Knee Flexion AROM;Strengthening;Right;Left;20 reps    Knee  Flexion Limitations marching - walker in front for safety    Forward Step Up Right;Left;Hand Hold: 2;Step Height: 6"    Forward Step Up Limitations trial with cane Lt and rail Rt up and down 2 steps x 4 reps; rail bilat UE's x 4 reps leading up with Aundra Millet /down with Rt - - trial of ste over step rail bilat x 4 reps    Functional Squat 1 set;20 reps;3 seconds    Functional Squat Limitations walker for UE support as needed      Shoulder Exercises: Seated   Row Strengthening;Both;20 reps;Theraband    Theraband Level (Shoulder Row) Level 4 (Blue)    Other Seated Exercises modified pull down with PT holding TB - x 20 reps x blue TB    Other Seated Exercises isometric holding core tight and body still w/ PT providing pull with TB into trunk flexion 3 sec hold x 10 reps no pain during exercise felt discomfort after exercise                       PT Long Term Goals - 05/02/20 1017      PT LONG TERM GOAL #1   Title The patient will be independent with HEP.    Time 6    Period Weeks    Status On-going      PT LONG TERM GOAL #2   Title The patient will improve LE strength to 4/5 to 4+/5 hip flexion, extension and abduction.    Time 6    Period Weeks    Status On-going      PT LONG TERM GOAL #3   Title Patient to demonstrate safe independent gait with assistive device as indicated for household distances    Time 6    Period Weeks    Status Partially Met      PT LONG TERM GOAL #4   Title Patient to demonstrate sit to stand and stand to sit transfers independently and safely    Time 6    Period Weeks    Status Partially Met      PT LONG TERM GOAL #5   Title Functional status measure increased to 23 indicating improved functional activities and ADL's    Time 6    Period Weeks    Status On-going                 Plan - 05/06/20 1046    Clinical Impression Statement Patient walking at home with cane. Still uses walker when outside the home. Worked on proper cane  placement (Lt UE instead of Rt); stride length; wt shift. Good progress with increasing strength for exercises. Progressing well with exercises.  Pt to MD tomorrow - note has been sent.    Rehab Potential Good    PT Frequency 2x / week    PT Duration 6 weeks    PT Treatment/Interventions ADLs/Self Care Home Management;Gait training;Stair training;Functional mobility training;Therapeutic activities;Therapeutic exercise;Balance training;Patient/family education;Manual techniques;Taping;Cryotherapy;Electrical Stimulation;Iontophoresis 19m/ml Dexamethasone;Moist Heat;Ultrasound;Neuromuscular re-education;Dry needling    PT Next Visit Plan Progress HEP to tolerance focusing on core stabilization; LE strengthening; improving functional mobility; gait training.    PT Home Exercise Plan F[place] Pointand Agree with Plan of Care Patient           Patient will benefit from skilled therapeutic intervention in order to improve the following deficits and impairments:     Visit Diagnosis: Muscle weakness (generalized)  Other abnormalities of gait and mobility  Acute low back pain with sciatica, sciatica laterality unspecified, unspecified back pain laterality  Unsteadiness on feet     Problem List Patient Active Problem List   Diagnosis Date Noted  . Lumbar herniated disc 02/01/2020  . Lumbar radiculopathy 05/22/2019  . Spastic gait 03/07/2019  . Myalgia 11/01/2018  . Chronic bilateral low back pain without sciatica 07/21/2018  . 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 CIDP (chronic inflammatory demyelinating polyneuropathy) ([facility] 12/30/2017  . Edema 03/01/2017  . Hypokalemia   . Slow transit constipation   . Steroid-induced hyperglycemia   . Urinary retention   . Accidental drug overdose   . Postoperative pain   . Hyponatremia   . Cervical myelopathy ([facility] 02/01/2017  . Shortness of breath at rest   . Hypoglycemia   . Spondylosis, cervical, with myelopathy   . Neuropathic  pain   . Benign essential HTN   . Labile blood glucose   . Muscle spasm   . GAD (generalized anxiety disorder)   . Acute inflammatory demyelinating polyneuropathy ([facility] 01/11/2017  . Anxiety state   . Neurogenic bladder   . Depression 12/30/2016  . Leg weakness, bilateral 12/30/2016  . 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[place] 12/30/2016  . GBS (Guillain Barre syndrome) ([facility]   . AIDP (acute inflammatory demyelinating polyneuropathy) ([facility] 11/27/2016  . Weakness 11/20/2016  . Weakness of both arms 10/30/2016  . 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 Obstructive sleep apnea 03/24/2016  . Numbness 03/18/2016  . Mild nonproliferative diabetic retinopathy of both eyes without macular edema associated with type 2 diabetes mellitus ([facility] 05/24/2015  . Nuclear cataract of both eyes 05/24/2015  . Diabetes mellitus without complication ([facility] 029/24/4628 . 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 Eustachian tube dysfunction 02/12/2015  . Acute maxillary sinusitis 02/12/2015  . Wellness examination 08/22/2014  . Alkaline phosphatase elevation 08/22/2014  . 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 Benign non-nodular prostatic hyperplasia with lower urinary tract symptoms 01/31/2014  . Lesion of eyelid 09/26/2013  . Screening for prostate cancer 04/24/2013  . Diabetes ([facility] 06/16/2012  . ED (erectile dysfunction) of organic origin 06/03/2012  . Routine general medical examination at a health care facility 04/10/2012  . BPH (benign prostatic hyperplasia) 02/03/2010  . HEMOCCULT POSITIVE STOOL 05/25/2008  . ELBOW PAIN, RIGHT 07/04/2007  . Dyslipidemia 02/16/2007  . ANXIETY 02/16/2007  . ERECTILE DYSFUNCTION 02/16/2007  . Essential hypertension 02/16/2007  . TESTICULAR MASS, LEFT 02/16/2007  . KNEE PAIN, CHRONIC 02/16/2007  . AMichell HeinrichOF 02/16/2007    Celyn PNilda SimmerPT, MPH  05/06/2020, 10:49 AM  [facility] [place] [address]Phone:  [phone]   Fax:  [phone]  Name: AVONTAE BURKHEAD MRN: [id number] Date of Birth: [birth year]

## 2020-05-07 ENCOUNTER — Encounter: Payer: Self-pay | Admitting: Physical Medicine & Rehabilitation

## 2020-05-07 ENCOUNTER — Encounter: Payer: Medicare Other | Attending: Physical Medicine & Rehabilitation | Admitting: Physical Medicine & Rehabilitation

## 2020-05-07 VITALS — BP 151/81 | HR 100 | Temp 98.0°F | Ht 72.0 in | Wt 219.8 lb

## 2020-05-07 DIAGNOSIS — G6181 Chronic inflammatory demyelinating polyneuritis: Secondary | ICD-10-CM

## 2020-05-07 DIAGNOSIS — R269 Unspecified abnormalities of gait and mobility: Secondary | ICD-10-CM | POA: Diagnosis not present

## 2020-05-07 DIAGNOSIS — R261 Paralytic gait: Secondary | ICD-10-CM

## 2020-05-07 DIAGNOSIS — G8918 Other acute postprocedural pain: Secondary | ICD-10-CM | POA: Diagnosis not present

## 2020-05-07 DIAGNOSIS — M792 Neuralgia and neuritis, unspecified: Secondary | ICD-10-CM | POA: Diagnosis not present

## 2020-05-07 MED ORDER — DULOXETINE HCL 60 MG PO CPEP: 60.0000 mg | ORAL_CAPSULE | Freq: Every day | ORAL | 1 refills | Status: DC

## 2020-05-07 MED ORDER — CELECOXIB 100 MG PO CAPS: 100.0000 mg | ORAL_CAPSULE | Freq: Every day | ORAL | 1 refills | Status: DC

## 2020-05-07 MED ORDER — AMITRIPTYLINE HCL 75 MG PO TABS: 75.0000 mg | ORAL_TABLET | Freq: Every day | ORAL | 1 refills | Status: DC

## 2020-05-07 NOTE — Progress Notes (Addendum)
Subjective:    Patient ID: Kenneth Pace, male    DOB: [birth year], 70 y.o.   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   Last clinic visit on 02/06/2020.  Wife supplements history. Since that time, patient states he is having pain again.  He had pain again and had another decompression and then later fusion in 03/2020.  He had pain afterward and went to the ED.  He is seeing Neurosurg. He had OIC and was started on Movantik.  He is in therapies. Neurosurg is prescribing meds.  He has lost a lot of weight. He is now retired. He continues to receive IVIG. Pt currently taking Robaxin per Neurosurg. His legs give out and has had "gradual decent".   Review of Systems  Constitutional: Negative.   HENT: Negative.   Eyes: Negative.   Respiratory: Negative.   Cardiovascular: Positive for leg swelling.  Gastrointestinal: Negative.   Endocrine: Negative.   Genitourinary: Negative.   Musculoskeletal: Positive for back pain and gait problem.  Skin: Negative.   Allergic/Immunologic: Negative.   Neurological: Positive for weakness and numbness.        Balance  Hematological: Negative.   Psychiatric/Behavioral: Negative.   All other systems reviewed and are negative.     Objective:   Physical Exam  Constitutional: No distress . Vital signs reviewed. HENT: Normocephalic.  Atraumatic. Eyes: EOMI. No discharge. Cardiovascular: No JVD.   Respiratory: Normal effort.  No stridor.   GI: Non-distended.   Skin: Warm and dry.  Intact. Psych: Normal mood.  Normal behavior. 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   1.  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  Continue therapies  Continue follow up Neurology -IVIG per Neurology  Retired    2.  Pain Management:   Gabapentin and Lyrica d/ced due to edema  No benefit with Mobic, Naproxen  Cont Lidoderm patch  Continue TENS prn  Continue Elavil to 75 qhs, educated on signs/symptoms of serotonin syndrome  Continue Cymbalta to 60mg  with food  Baclofen per Neurology on hold due to starting Robaxin  Cont Tylenol  Continue Celebrex 100 qhs  3. Gait abnormality  Continue HEP  Using cane more due to recent surgery  See #1  4. Mechanical low back - multifactorial  Myalgia +/- facet arthropathy + disc herniation  MRI L-spine reviewed, multilevel predominantly foraminal and facet arthropathy  Continue Balcofen per Neurology  Cont Cymbalta  Note written for gym participation - discussed safety, but unable to go because it is still closed until 2022   5. Myalgia  Good benefit with TP injections, again on 3/9  See #1, #4  6. Spasticity  See #1,2,3  Continue Baclofen  Cont HEP  Will consider further intervention after therapies and attendance in gym  7. Essential HTN  Follow  up with PCP  8. Lumbar radiculopathy with repeat surgery due to herniated disc x3  S/p decompression right L4  S/p laminotomy and right L4-5 microdisc  S/p fusion in 03/2020  9. Urinary urgency  Plan to follow up with Urology  Pain Inventory Average Pain 5 Pain Right Now 7 My pain is constant, sharp and dull  In the last 24 hours, has pain interfered with the following? General activity 4 Relation with others 0 Enjoyment of  life 2 What TIME of day is your pain at its worst? night, evening Sleep? fair Pain is worse with: bending, inactivity and some activites Pain improves with: rest and medication Relief from Meds: 5    Family History  Problem Relation Age of Onset  . Dementia Mother   . Diabetes Mellitus I Mother   . Hypertension Father        62  . Healthy Sister   . Rheum arthritis Brother   . Cancer Neg Hx   . Colon cancer Neg Hx   . Esophageal cancer Neg Hx   . Stomach cancer Neg Hx   . Rectal cancer Neg Hx   . Colon polyps Neg Hx    Social History   Socioeconomic History  . Marital status: Married    Spouse name: Not on file  . Number of children: Not on file  . Years of education: Not on file  . Highest education level: Not on file  Occupational History  . Occupation: Lobbyist: [employer]    Comment: [employer]  Tobacco Use  . Smoking status: Never Smoker  . Smokeless tobacco: Never Used  Vaping Use  . Vaping Use: Never used  Substance and Sexual Activity  . Alcohol use: No  . Drug use: No  . Sexual activity: Not Currently  Other Topics Concern  . Not on file  Social History Narrative   He works for [employer]   He lives at home with wife.     Highest level of education:  BS, business admin   Right handed   Two story home   Social Determinants of Health   Financial Resource Strain: Not on file  Food Insecurity: Not on file  Transportation Needs: Not on file  Physical Activity: Not on file  Stress: Not on file  Social Connections: Not on file   Past Surgical History:  Procedure Laterality Date  . ANTERIOR CERVICAL DECOMP/DISCECTOMY FUSION N/A 02/01/2017   Procedure: ANTERIOR CERVICAL DECOMPRESSION/DISCECTOMY FUSION CERVICAL THREE-FOUR , CERVICAL FOUR-FIVE  CERVICAL FIVE-SIX;  Surgeon: Consuella Lose, MD;  Location: [facility];  Service: Neurosurgery;  Laterality: N/A;  . APPENDECTOMY    . BACK SURGERY    .  COLONOSCOPY  02/05/2010   PATTERSON  . EYE SURGERY Right    Dr. Katy Fitch  . KNEE SURGERY     2 Lknee arthroscopy, 3 R knee arthroscpoy  . KNEE SURGERY Right 11/12/2016  . LUMBAR FUSION  03/26/2020  . LUMBAR LAMINECTOMY/DECOMPRESSION MICRODISCECTOMY Right 05/22/2019   Procedure: LAMINOTOMY AND MICRODISCECTOMY RIGHT LUMBAR FOUR- LUMBAR FIVE;  Surgeon: Consuella Lose, MD;  Location: [facility];  Service: Neurosurgery;  Laterality: Right;  LAMINOTOMY AND MICRODISCECTOMY RIGHT LUMBAR FOUR- LUMBAR FIVE  . LUMBAR LAMINECTOMY/DECOMPRESSION MICRODISCECTOMY Right 02/01/2020   Procedure: REDO MICRODISCECTOMY RT LUMBAR FOUR-FIVE;  Surgeon: Consuella Lose, MD;  Location: [facility];  Service: Neurosurgery;  Laterality: Right;  posterior  . TONSILLECTOMY     Past Medical History:  Diagnosis Date  . ADENOIDECTOMY, HX OF 02/16/2007  . Anxiety    pt. states he does not have anxiety.  Marland Kitchen BENIGN PROSTATIC HYPERTROPHY, WITH OBSTRUCTION 02/03/2010  . 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 DIABETES MELLITUS, TYPE I, UNCONTROLLED 12/29/2006  . ERECTILE DYSFUNCTION 02/16/2007  . Hearing loss    wears hearing aids  . HYPERLIPIDEMIA 02/16/2007  . HYPERTENSION 02/16/2007  . Nerve pain    per patient "on lower back and both legs"  . Sleep apnea    uses CPAP  . TESTICULAR MASS, LEFT 02/16/2007  . Therapeutic opioid-induced constipation (OIC)    BP (!) 151/81   Pulse 100   Temp 98 F (36.7 C)   Ht 6' (1.829 m)   Wt 219 lb 12.8 oz (99.7 kg)   SpO2 97%   BMI 29.81 kg/m   Opioid Risk Score:   Fall Risk Score:  `1  Depression screen PHQ 2/9  Depression screen [facility] 2/9 05/07/2020 12/10/2017 07/07/2017  Decreased Interest 0 0 0  Down, Depressed, Hopeless 0 0 0  PHQ - 2 Score 0 0 0  Some recent data might be hidden

## 2020-05-09 ENCOUNTER — Encounter: Payer: Self-pay | Admitting: Rehabilitative and Restorative Service Providers"

## 2020-05-09 ENCOUNTER — Other Ambulatory Visit: Payer: Self-pay

## 2020-05-09 ENCOUNTER — Ambulatory Visit (INDEPENDENT_AMBULATORY_CARE_PROVIDER_SITE_OTHER): Payer: Medicare Other | Admitting: Rehabilitative and Restorative Service Providers"

## 2020-05-09 DIAGNOSIS — M5440 Lumbago with sciatica, unspecified side: Secondary | ICD-10-CM

## 2020-05-09 DIAGNOSIS — M6281 Muscle weakness (generalized): Secondary | ICD-10-CM

## 2020-05-09 DIAGNOSIS — R2681 Unsteadiness on feet: Secondary | ICD-10-CM | POA: Diagnosis not present

## 2020-05-09 DIAGNOSIS — R2689 Other abnormalities of gait and mobility: Secondary | ICD-10-CM

## 2020-05-09 NOTE — Therapy (Signed)
[place], [place], [address] Phone: [phone]   Fax:  [phone]  Physical Therapy Treatment  Patient Details  Name: Kenneth Pace MRN: [id number] Date of Birth: 02-29-1950 Referring Provider (PT): Consuella Lose, MD   Encounter Date: 05/09/2020   PT End of Session - 05/09/20 1014    Visit Number 8    Number of Visits 12    Date for PT Re-Evaluation 05/16/20    PT Start Time 1008    PT Stop Time 1043    PT Time Calculation (min) 35 min    Activity Tolerance Patient tolerated treatment well           Past Medical History:  Diagnosis Date  . ADENOIDECTOMY, HX OF 02/16/2007  . Anxiety    pt. states he does not have anxiety.  Marland Kitchen BENIGN PROSTATIC HYPERTROPHY, WITH OBSTRUCTION 02/03/2010  . 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 DIABETES MELLITUS, TYPE I, UNCONTROLLED 12/29/2006  . ERECTILE DYSFUNCTION 02/16/2007  . Hearing loss    wears hearing aids  . HYPERLIPIDEMIA 02/16/2007  . HYPERTENSION 02/16/2007  . Nerve pain    per patient "on lower back and both legs"  . Sleep apnea    uses CPAP  . TESTICULAR MASS, LEFT 02/16/2007  . Therapeutic opioid-induced constipation (OIC)     Past Surgical History:  Procedure Laterality Date  . ANTERIOR CERVICAL DECOMP/DISCECTOMY FUSION N/A 02/01/2017   Procedure: ANTERIOR CERVICAL DECOMPRESSION/DISCECTOMY FUSION CERVICAL THREE-FOUR , CERVICAL FOUR-FIVE  CERVICAL FIVE-SIX;  Surgeon: Consuella Lose, MD;  Location: [facility];  Service: Neurosurgery;  Laterality: N/A;  . APPENDECTOMY    . BACK SURGERY    . COLONOSCOPY  02/05/2010   [facility]  . EYE SURGERY Right    Dr. Katy Fitch  . KNEE SURGERY     2 Lknee arthroscopy, 3 R knee arthroscpoy  . KNEE SURGERY Right 11/12/2016  . LUMBAR FUSION  03/26/2020  . LUMBAR LAMINECTOMY/DECOMPRESSION MICRODISCECTOMY Right 05/22/2019   Procedure: LAMINOTOMY AND  MICRODISCECTOMY RIGHT LUMBAR FOUR- LUMBAR FIVE;  Surgeon: Consuella Lose, MD;  Location: [facility];  Service: Neurosurgery;  Laterality: Right;  LAMINOTOMY AND MICRODISCECTOMY RIGHT LUMBAR FOUR- LUMBAR FIVE  . LUMBAR LAMINECTOMY/DECOMPRESSION MICRODISCECTOMY Right 02/01/2020   Procedure: REDO MICRODISCECTOMY RT LUMBAR FOUR-FIVE;  Surgeon: Consuella Lose, MD;  Location: [facility];  Service: Neurosurgery;  Laterality: Right;  posterior  . TONSILLECTOMY      There were no vitals filed for this visit.   Subjective Assessment - 05/09/20 1015    Subjective Patient reports that the exercise was too much last visit. He was tired and had increased pain for the next tow days. OK today    Currently in Pain? Yes    Pain Score 7     Pain Location Back    Pain Orientation Right;Left;Lower    Pain Descriptors / Indicators Aching;Dull    Pain Type Acute pain;Surgical pain                             [facility] Adult PT Treatment/Exercise - 05/09/20 0001      Transfers   Comments ambulatioin with straight cane Lt UEcontact gurad assist VC for cane and foot placement x 200 ft level surface      Ambulation/Gait   Ambulation/Gait Assistance 5: Supervision    Ambulation Distance (Feet) 200 Feet    Assistive device Straight cane  Gait Pattern Step-through pattern;Decreased stance time - right;Antalgic   ER of Rt > Lt LE   Ambulation Surface Level    Gait Comments ambulated with SBA of PT clinic to car ~ 443f reviewed technique getting into the car      Lumbar Exercises: Aerobic   Nustep L5 x 10 min U/LE's      Lumbar Exercises: Standing   Functional Squats 10 reps;2 seconds    Functional Squats Limitations shallow knee bend - UE support on walker - has pain with deeper squat    Other Standing Lumbar Exercises standing to work on posture; alignment; endurance 1-2 min x 2 reps      Lumbar Exercises: Seated   Long Arc Quad on Chair Strengthening;Right;Left;5 reps    Sit to Stand 5  reps    Sit to Stand Limitations no UE assist    Other Seated Lumbar Exercises hip flexion sitting x  5 each LE back supported on chair    Other Seated Lumbar Exercises ankle DF in sitting LB supported x 10 each ankle 2-3 sec hold                       PT Long Term Goals - 05/02/20 1017      PT LONG TERM GOAL #1   Title The patient will be independent with HEP.    Time 6    Period Weeks    Status On-going      PT LONG TERM GOAL #2   Title The patient will improve LE strength to 4/5 to 4+/5 hip flexion, extension and abduction.    Time 6    Period Weeks    Status On-going      PT LONG TERM GOAL #3   Title Patient to demonstrate safe independent gait with assistive device as indicated for household distances    Time 6    Period Weeks    Status Partially Met      PT LONG TERM GOAL #4   Title Patient to demonstrate sit to stand and stand to sit transfers independently and safely    Time 6    Period Weeks    Status Partially Met      PT LONG TERM GOAL #5   Title Functional status measure increased to 23 indicating improved functional activities and ADL's    Time 6    Period Weeks    Status On-going                 Plan - 05/09/20 1016    Clinical Impression Statement Patient reports overdoing it in therapy last visit. Discussed the importance of letting the PT know when he is tired or needs a break instead of pushing through. Adjusted exercise intensity today - less intensity with exercises in clinic today. Improving gait stability and stride length. Less shuffle of the Rt LE. Patient reported that today's amount of work was "just right"    Rehab Potential Good    PT Frequency 2x / week    PT Duration 6 weeks    PT Treatment/Interventions ADLs/Self Care Home Management;Gait training;Stair training;Functional mobility training;Therapeutic activities;Therapeutic exercise;Balance training;Patient/family education;Manual  techniques;Taping;Cryotherapy;Electrical Stimulation;Iontophoresis 418mml Dexamethasone;Moist Heat;Ultrasound;Neuromuscular re-education;Dry needling    PT Next Visit Plan Progress HEP to tolerance focusing on core stabilization; LE strengthening; improving functional mobility; gait training - working with patient to avoid overdoing exercises and activities in clinic    [place]  and Agree with Plan of Care Patient           Patient will benefit from skilled therapeutic intervention in order to improve the following deficits and impairments:     Visit Diagnosis: Muscle weakness (generalized)  Other abnormalities of gait and mobility  Acute low back pain with sciatica, sciatica laterality unspecified, unspecified back pain laterality  Unsteadiness on feet     Problem List Patient Active Problem List   Diagnosis Date Noted  . Lumbar herniated disc 02/01/2020  . Lumbar radiculopathy 05/22/2019  . Spastic gait 03/07/2019  . Myalgia 11/01/2018  . Chronic bilateral low back pain without sciatica 07/21/2018  . 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 CIDP (chronic inflammatory demyelinating polyneuropathy) ([place]) 12/30/2017  . Edema 03/01/2017  . Hypokalemia   . Slow transit constipation   . Steroid-induced hyperglycemia   . Urinary retention   . Accidental drug overdose   . Postoperative pain   . Hyponatremia   . Cervical myelopathy ([place]) 02/01/2017  . Shortness of breath at rest   . Hypoglycemia   . Spondylosis, cervical, with myelopathy   . Neuropathic pain   . Benign essential HTN   . Labile blood glucose   . Muscle spasm   . GAD (generalized anxiety disorder)   . Acute inflammatory demyelinating polyneuropathy ([place]) 01/11/2017  . Anxiety state   . Neurogenic bladder   . Depression 12/30/2016  . Leg weakness, bilateral 12/30/2016  . 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 GBS (Guillain Barre  syndrome) ([place])   . AIDP (acute inflammatory demyelinating polyneuropathy) ([place]) 11/27/2016  . Weakness 11/20/2016  . Weakness of both arms 10/30/2016  . 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 Obstructive sleep apnea 03/24/2016  . Numbness 03/18/2016  . Mild nonproliferative diabetic retinopathy of both eyes without macular edema associated with type 2 diabetes mellitus ([place]) 05/24/2015  . Nuclear cataract of both eyes 05/24/2015  . Diabetes mellitus without complication ([place]) 45/80/9983  . 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 Eustachian tube dysfunction 02/12/2015  . Acute maxillary sinusitis 02/12/2015  . Wellness examination 08/22/2014  . Alkaline phosphatase elevation 08/22/2014  . 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 Benign non-nodular prostatic hyperplasia with lower urinary tract symptoms 01/31/2014  . Lesion of eyelid 09/26/2013  . Screening for prostate cancer 04/24/2013  . Diabetes ([place]) 06/16/2012  . ED (erectile dysfunction) of organic origin 06/03/2012  . Routine general medical examination at a health care facility 04/10/2012  . BPH (benign prostatic hyperplasia) 02/03/2010  . HEMOCCULT POSITIVE STOOL 05/25/2008  . ELBOW PAIN, RIGHT 07/04/2007  . Dyslipidemia 02/16/2007  . ANXIETY 02/16/2007  . ERECTILE DYSFUNCTION 02/16/2007  . Essential hypertension 02/16/2007  . TESTICULAR MASS, LEFT 02/16/2007  . KNEE PAIN, CHRONIC 02/16/2007  . Michell Heinrich OF 02/16/2007    Rishi Vicario Nilda Simmer PT, MPH  05/09/2020, 10:49 AM  [facility] [place], [place], [address] Phone: [phone]   Fax:  [phone]  Name: Kenneth Pace MRN: [id number] Date of Birth: [birth year]

## 2020-05-14 ENCOUNTER — Telehealth: Payer: Self-pay | Admitting: Endocrinology

## 2020-05-14 ENCOUNTER — Ambulatory Visit (INDEPENDENT_AMBULATORY_CARE_PROVIDER_SITE_OTHER): Payer: Medicare Other | Admitting: Endocrinology

## 2020-05-14 ENCOUNTER — Encounter: Payer: Medicare Other | Admitting: Physical Therapy

## 2020-05-14 ENCOUNTER — Other Ambulatory Visit: Payer: Self-pay

## 2020-05-14 VITALS — BP 140/84 | HR 91 | Ht 72.0 in | Wt 221.0 lb

## 2020-05-14 DIAGNOSIS — E10319 Type 1 diabetes mellitus with unspecified diabetic retinopathy without macular edema: Secondary | ICD-10-CM | POA: Diagnosis not present

## 2020-05-14 DIAGNOSIS — E1042 Type 1 diabetes mellitus with diabetic polyneuropathy: Secondary | ICD-10-CM | POA: Diagnosis not present

## 2020-05-14 LAB — POCT GLYCOSYLATED HEMOGLOBIN (HGB A1C): Hemoglobin A1C: 7 % — AB (ref 4.0–5.6)

## 2020-05-14 MED ORDER — INSULIN LISPRO 100 UNIT/ML ~~LOC~~ SOLN: SUBCUTANEOUS | 3 refills | Status: DC

## 2020-05-14 MED ORDER — ENLITE GLUCOSE SENSOR MISC: 1.0000 | 3 refills | Status: AC

## 2020-05-14 MED ORDER — "QUICK-SET INFUSION 43"" 9MM MISC": 1.0000 | 3 refills | Status: DC

## 2020-05-14 MED ORDER — PARADIGM PUMP RESERVOIR 3ML MISC: 1.0000 | 3 refills | Status: DC

## 2020-05-14 NOTE — Telephone Encounter (Signed)
Rx sent 

## 2020-05-14 NOTE — Progress Notes (Signed)
Subjective:    Patient ID: Kenneth Pace, male    DOB: February 09, 1951, 70 y.o.   MRN: [id number]  HPI Pt returns for f/u of diabetes mellitus:  DM type: 1 Dx'ed: 4235 Complications: DR and PN. Therapy: insulin since dx.  DKA: only at dx.   Severe hypoglycemia: most recently in 2014.  Pancreatitis: never Other: he has a medtronic 670 pump and continuous glucose monitor; he is retired. continuous glucose monitor ID is [email or web address] and password is [email or web address].   Interval history: he takes these pump settings:  basal rate of 4.5 units/hr 6 AM-10 PM, and 2.6 units/hr 10 PM-6 AM (when not in auto mode).  Mealtime bolus of 1 unit/7 grams cardohydrate correction bolus (which some people call "sensitivity," or "insulin sensitivity ratio," or just "isr") of 1 unit for each 50 by which your glucose exceeds 100.  suspend the pump for 1-2 hrs, with activity.  If not, eat a light snack with it.  TDD is 93 units (100% basal) He has not recently used continuous glucose monitor, due to ins problems  He has mild hypoglycemia approx twice per month.  This happens in the afternoon.  Pt says cbg varies from 40-210.  It is in general highest at HS.  He had another back surgery 6 weeks ago.  He has been taking very few boluses, due to weight loss.   Past Medical History:  Diagnosis Date  . ADENOIDECTOMY, HX OF 02/16/2007  . Anxiety    pt. states he does not have anxiety.  Marland Kitchen BENIGN PROSTATIC HYPERTROPHY, WITH OBSTRUCTION 02/03/2010  . 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 DIABETES MELLITUS, TYPE I, UNCONTROLLED 12/29/2006  . ERECTILE DYSFUNCTION 02/16/2007  . Hearing loss    wears hearing aids  . HYPERLIPIDEMIA 02/16/2007  . HYPERTENSION 02/16/2007  . Nerve pain    per patient "on lower back and both legs"  . Sleep apnea    uses CPAP  . TESTICULAR MASS, LEFT 02/16/2007  . Therapeutic opioid-induced constipation (OIC)     Past Surgical History:   Procedure Laterality Date  . ANTERIOR CERVICAL DECOMP/DISCECTOMY FUSION N/A 02/01/2017   Procedure: ANTERIOR CERVICAL DECOMPRESSION/DISCECTOMY FUSION CERVICAL THREE-FOUR , CERVICAL FOUR-FIVE  CERVICAL FIVE-SIX;  Surgeon: Consuella Lose, MD;  Location: [facility];  Service: Neurosurgery;  Laterality: N/A;  . APPENDECTOMY    . BACK SURGERY    . COLONOSCOPY  02/05/2010   [facility]  . EYE SURGERY Right    Dr. Katy Fitch  . KNEE SURGERY     2 Lknee arthroscopy, 3 R knee arthroscpoy  . KNEE SURGERY Right 11/12/2016  . LUMBAR FUSION  03/26/2020  . LUMBAR LAMINECTOMY/DECOMPRESSION MICRODISCECTOMY Right 05/22/2019   Procedure: LAMINOTOMY AND MICRODISCECTOMY RIGHT LUMBAR FOUR- LUMBAR FIVE;  Surgeon: Consuella Lose, MD;  Location: [facility];  Service: Neurosurgery;  Laterality: Right;  LAMINOTOMY AND MICRODISCECTOMY RIGHT LUMBAR FOUR- LUMBAR FIVE  . LUMBAR LAMINECTOMY/DECOMPRESSION MICRODISCECTOMY Right 02/01/2020   Procedure: REDO MICRODISCECTOMY RT LUMBAR FOUR-FIVE;  Surgeon: Consuella Lose, MD;  Location: [facility];  Service: Neurosurgery;  Laterality: Right;  posterior  . TONSILLECTOMY      Social History   Socioeconomic History  . Marital status: Married    Spouse name: Not on file  . Number of children: Not on file  . Years of education: Not on file  . Highest education level: Not on file  Occupational History  . Occupation: Lobbyist: [employer]    Comment:  [employer]  Tobacco Use  . Smoking status: Never Smoker  . Smokeless tobacco: Never Used  Vaping Use  . Vaping Use: Never used  Substance and Sexual Activity  . Alcohol use: No  . Drug use: No  . Sexual activity: Not Currently  Other Topics Concern  . Not on file  Social History Narrative   He works for [employer]   He lives at home with wife.     Highest level of education:  BS, business admin   Right handed   Two story home   Social Determinants of Health   Financial  Resource Strain: Not on file  Food Insecurity: Not on file  Transportation Needs: Not on file  Physical Activity: Not on file  Stress: Not on file  Social Connections: Not on file  Intimate Partner Violence: Not on file    Current Outpatient Medications on File Prior to Visit  Medication Sig Dispense Refill  . acetaminophen (TYLENOL) 500 MG tablet Take 500 mg by mouth every 6 (six) hours as needed for mild pain.    Marland Kitchen amitriptyline (ELAVIL) 75 MG tablet Take 1 tablet (75 mg total) by mouth at bedtime. 90 tablet 1  . aspirin EC 81 MG tablet Take 1 tablet (81 mg total) by mouth daily. Swallow whole. 30 tablet 11  . B Complex Vitamins (B-COMPLEX/B-12 PO) Take 1 tablet by mouth at bedtime.    . baclofen (LIORESAL) 10 MG tablet Take 2 tablets (20 mg total) by mouth 3 (three) times daily. 540 tablet 3  . celecoxib (CELEBREX) 100 MG capsule Take 1 capsule (100 mg total) by mouth at bedtime. 90 capsule 1  . clotrimazole-betamethasone (LOTRISONE) cream Apply 1 application topically 2 (two) times daily. (Patient taking differently: Apply 1 application topically daily as needed (Dry skin).) 90 g 2  . Cranberry 400 MG TABS Take 400 mg by mouth at bedtime.    . DULoxetine (CYMBALTA) 60 MG capsule Take 1 capsule (60 mg total) by mouth daily. 90 capsule 1  . finasteride (PROSCAR) 5 MG tablet Take 1 tablet (5 mg total) by mouth at bedtime. 90 tablet 2  . glucagon 1 MG injection Inject 1 mg into the vein once as needed. 1 each 12  . glucose blood (BAYER CONTOUR NEXT TEST) test strip MEDICALLY NECESSARY FOR USE WITH PUMP; Use to check blood sugar 5 times per day and prn; E11.42 500 each 3  . Immune Globulin 10% (IMMUNE GLOBULIN 10%) 10G/163mL (10,000mg /123mL) SOLN Inject 105 g into the vein every 6 (six) weeks. 289.5 mL 10  . Insulin Human (INSULIN PUMP) SOLN Inject 120 each into the skin daily. HUMALOG    . lidocaine (LIDODERM) 5 % At 7 am and remove at 7 pm. Apply 1 on each side. (Patient taking differently:  Place 2 patches onto the skin daily as needed (pain).) 180 patch 2  . lisinopril-hydrochlorothiazide (ZESTORETIC) 10-12.5 MG tablet Take 1 tablet by mouth at bedtime. 90 tablet 3  . methocarbamol (ROBAXIN-750) 750 MG tablet Take 1 tablet (750 mg total) by mouth 3 (three) times daily as needed for muscle spasms. 90 tablet 1  . MOVANTIK 12.5 MG TABS tablet Take by mouth daily.    . Multiple Vitamin (MULTIVITAMIN) capsule Take 1 capsule by mouth at bedtime.    Marland Kitchen oxyCODONE (ROXICODONE) 5 MG immediate release tablet Take 1-2 tablets (5-10 mg total) by mouth every 4 (four) hours as needed. 60 tablet 0  . oxyCODONE-acetaminophen (PERCOCET) 7.5-325  MG tablet Take 1 tablet by mouth every 4 (four) hours as needed for severe pain. 60 tablet 0  . pseudoephedrine (SUDAFED) 30 MG tablet Take 30 mg by mouth at bedtime as needed for congestion.    Marland Kitchen scopolamine (TRANSDERM-SCOP) 1 MG/3DAYS Place 1 patch (1.5 mg total) onto the skin every 3 (three) days. 10 patch 12  . traMADol (ULTRAM) 50 MG tablet Take 50 mg by mouth every 6 (six) hours as needed (mild pain).     No current facility-administered medications on file prior to visit.    No Known Allergies  Family History  Problem Relation Age of Onset  . Dementia Mother   . Diabetes Mellitus I Mother   . Hypertension Father        84  . Healthy Sister   . Rheum arthritis Brother   . Cancer Neg Hx   . Colon cancer Neg Hx   . Esophageal cancer Neg Hx   . Stomach cancer Neg Hx   . Rectal cancer Neg Hx   . Colon polyps Neg Hx     BP 140/84 (BP Location: Right Arm, Patient Position: Sitting, Cuff Size: Large)   Pulse 91   Ht 6' (1.829 m)   Wt 221 lb (100.2 kg)   SpO2 97%   BMI 29.97 kg/m    Review of Systems He has lost 13 lbs since last ov.     Objective:   Physical Exam VITAL SIGNS:  See vs page GENERAL: no distress Pulses: dorsalis pedis intact bilat.   MSK: no deformity of the feet CV: 2+ bilat leg edema Skin:  no ulcer on the feet.   normal color and temp on the feet.   Neuro: sensation is intact to touch on the feet, but decreased from normal.   Ext: there is bilateral onychomycosis of the toenails.     Lab Results  Component Value Date   HGBA1C 7.0 (A) 05/14/2020       Assessment & Plan:  Type 1 DM, with DR. Hypoglycemia, due to insulin: this limits aggressiveness of glycemic control   Patient Instructions  Please take these pump settings:  basal rate of 4.2 units/hr 6 AM-10 PM, and 2.4 units/hr 10 PM-6 AM (when not in auto mode). Mealtime bolus of 1 unit/7 grams cardohydrate correction bolus (which some people call "sensitivity," or "insulin sensitivity ratio," or just "isr") of 1 unit for each 50 by which your glucose exceeds 100.  suspend the pump for 1-2 hrs, with activity.  If not, eat a light snack with it.   On this type of insulin schedule, you should eat meals on a regular schedule.  If a meal is missed or significantly delayed, your blood sugar could go low.  Please come back for a follow-up appointment in 3 months.

## 2020-05-14 NOTE — Telephone Encounter (Signed)
Patient has advised that the pharmacy of choice is now [facility] not [facility].  Please correct and resend prescriptions that were sent this morning

## 2020-05-14 NOTE — Patient Instructions (Addendum)
Please take these pump settings:  basal rate of 4.2 units/hr 6 AM-10 PM, and 2.4 units/hr 10 PM-6 AM (when not in auto mode). Mealtime bolus of 1 unit/7 grams cardohydrate correction bolus (which some people call "sensitivity," or "insulin sensitivity ratio," or just "isr") of 1 unit for each 50 by which your glucose exceeds 100.  suspend the pump for 1-2 hrs, with activity.  If not, eat a light snack with it.   On this type of insulin schedule, you should eat meals on a regular schedule.  If a meal is missed or significantly delayed, your blood sugar could go low.  Please come back for a follow-up appointment in 3 months.

## 2020-05-14 NOTE — Telephone Encounter (Signed)
Patient's wife called re: Part B Medicare requires RX requests for insulin must be filled at local [facility]. Requests the following:  MEDICATION: insulin lispro (HUMALOG) 100 UNIT/ML injection  PHARMACY:   [id number] - [place], [place] - [address] AT [address] Phone:  [phone]  Fax:  [phone]       HAS THE PATIENT CONTACTED [facility]?  Yes  IS THIS A 90 DAY SUPPLY : Yes-states 11 vials  IS PATIENT OUT OF MEDICATION: No  IF NOT; HOW MUCH IS LEFT: Approx. 2 weeks  LAST APPOINTMENT DATE: @3 /29/2022  NEXT APPOINTMENT DATE:@6 /29/2022  DO WE HAVE YOUR PERMISSION TO LEAVE A DETAILED MESSAGE?: Yes  OTHER COMMENTS:    **Let patient know to contact pharmacy at the end of the day to make sure medication is ready. **  ** Please notify patient to allow 48-72 hours to process**  **Encourage patient to contact the pharmacy for refills or they can request refills through [facility]**

## 2020-05-16 ENCOUNTER — Encounter: Payer: Self-pay | Admitting: Rehabilitative and Restorative Service Providers"

## 2020-05-16 ENCOUNTER — Ambulatory Visit (INDEPENDENT_AMBULATORY_CARE_PROVIDER_SITE_OTHER): Payer: Medicare Other | Admitting: Rehabilitative and Restorative Service Providers"

## 2020-05-16 ENCOUNTER — Other Ambulatory Visit: Payer: Self-pay

## 2020-05-16 DIAGNOSIS — R2681 Unsteadiness on feet: Secondary | ICD-10-CM | POA: Diagnosis not present

## 2020-05-16 DIAGNOSIS — R2689 Other abnormalities of gait and mobility: Secondary | ICD-10-CM | POA: Diagnosis not present

## 2020-05-16 DIAGNOSIS — M6281 Muscle weakness (generalized): Secondary | ICD-10-CM | POA: Diagnosis not present

## 2020-05-16 DIAGNOSIS — M5440 Lumbago with sciatica, unspecified side: Secondary | ICD-10-CM

## 2020-05-16 NOTE — Therapy (Signed)
[place], [place], [address] Phone: [phone]   Fax:  [phone]  Physical Therapy Treatment  Progress Note Reporting Period 04/04/20 to 05/16/20  See note below for Objective Data and Assessment of Progress/Goals.          Patient Details  Name: Kenneth Pace MRN: [id number] Date of Birth: [birth year] Referring Provider (PT): Consuella Lose, MD   Encounter Date: 05/16/2020   PT End of Session - 05/16/20 1151    Visit Number 9    Number of Visits 12    Date for PT Re-Evaluation 06/26/20    PT Start Time 4268    PT Stop Time 1230    PT Time Calculation (min) 45 min    Activity Tolerance Patient tolerated treatment well           Past Medical History:  Diagnosis Date  . ADENOIDECTOMY, HX OF 02/16/2007  . Anxiety    pt. states he does not have anxiety.  Marland Kitchen BENIGN PROSTATIC HYPERTROPHY, WITH OBSTRUCTION 02/03/2010  . 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 DIABETES MELLITUS, TYPE I, UNCONTROLLED 12/29/2006  . ERECTILE DYSFUNCTION 02/16/2007  . Hearing loss    wears hearing aids  . HYPERLIPIDEMIA 02/16/2007  . HYPERTENSION 02/16/2007  . Nerve pain    per patient "on lower back and both legs"  . Sleep apnea    uses CPAP  . TESTICULAR MASS, LEFT 02/16/2007  . Therapeutic opioid-induced constipation (OIC)     Past Surgical History:  Procedure Laterality Date  . ANTERIOR CERVICAL DECOMP/DISCECTOMY FUSION N/A 02/01/2017   Procedure: ANTERIOR CERVICAL DECOMPRESSION/DISCECTOMY FUSION CERVICAL THREE-FOUR , CERVICAL FOUR-FIVE  CERVICAL FIVE-SIX;  Surgeon: Consuella Lose, MD;  Location: [facility];  Service: Neurosurgery;  Laterality: N/A;  . APPENDECTOMY    . BACK SURGERY    . COLONOSCOPY  02/05/2010   [facility]  . EYE SURGERY Right    Dr. Katy Fitch  . KNEE SURGERY     2 Lknee arthroscopy, 3 R knee arthroscpoy  . KNEE SURGERY Right  11/12/2016  . LUMBAR FUSION  03/26/2020  . LUMBAR LAMINECTOMY/DECOMPRESSION MICRODISCECTOMY Right 05/22/2019   Procedure: LAMINOTOMY AND MICRODISCECTOMY RIGHT LUMBAR FOUR- LUMBAR FIVE;  Surgeon: Consuella Lose, MD;  Location: [facility];  Service: Neurosurgery;  Laterality: Right;  LAMINOTOMY AND MICRODISCECTOMY RIGHT LUMBAR FOUR- LUMBAR FIVE  . LUMBAR LAMINECTOMY/DECOMPRESSION MICRODISCECTOMY Right 02/01/2020   Procedure: REDO MICRODISCECTOMY RT LUMBAR FOUR-FIVE;  Surgeon: Consuella Lose, MD;  Location: [facility];  Service: Neurosurgery;  Laterality: Right;  posterior  . TONSILLECTOMY      There were no vitals filed for this visit.   Subjective Assessment - 05/16/20 1153    Subjective Feels the last exercise session in PT was about right. He didn't have the fatigue he has experienced in some of the past appts.    Pertinent History laminectomy april 2021 and again 12/21, 2018 cervical decompression    Patient Stated Goals improve strength, be able to get up and down better, community exercise    Currently in Pain? Yes    Pain Score 5     Pain Location Back    Pain Orientation Right;Left;Lower    Pain Descriptors / Indicators Aching;Dull    Pain Type Acute pain;Chronic pain;Surgical pain    Pain Radiating Towards radicular pain resolved with surgery    Pain Frequency Constant    Aggravating Factors  changing positions; overdoing exercises/walking    Pain Relieving Factors  some improvement w/ medication; rest; stop movement; lying down              [facility] PT Assessment - 05/16/20 0001      Assessment   Medical Diagnosis L4-L5 discectomy/lumbar fusion    Referring Provider (PT) Consuella Lose, MD    Onset Date/Surgical Date 03/26/20    Hand Dominance Right    Next MD Visit 4/22    Prior Therapy yes      Observation/Other Assessments   Focus on Therapeutic Outcomes (FOTO)  19      Strength   Right/Left Hip --   assessed in supine and sidelying   Right Hip Flexion 4-/5     Right Hip Extension 4-/5    Right Hip ABduction 4/5    Left Hip Flexion 4-/5    Left Hip Extension 4/5    Left Hip ABduction 4/5    Right/Left Knee --   assessed with pt in supine   Right Knee Flexion 5/5    Right Knee Extension 4/5    Left Knee Flexion 5/5    Left Knee Extension 4/5    Right Ankle Dorsiflexion 4/5    Left Ankle Dorsiflexion 4+/5      Ambulation/Gait   Ambulation Distance (Feet) 200 Feet    Assistive device Straight cane    Gait Pattern Step-through pattern;Decreased stance time - right;Antalgic    Ambulation Surface Level    Gait Comments improving gait pattern; stability with cane                         [facility] Adult PT Treatment/Exercise - 05/16/20 0001      Lumbar Exercises: Stretches   Passive Hamstring Stretch Right;Left;3 reps;30 seconds   supine with strap     Lumbar Exercises: Aerobic   Nustep L5 x 10 min U/LE's      Lumbar Exercises: Standing   Functional Squats 10 reps;2 seconds    Functional Squats Limitations shallow knee bend - UE support on walker - has pain with deeper squat    Other Standing Lumbar Exercises standing usupported to work on posture; alignment; endurance 1-2 min x 2 reps      Lumbar Exercises: Seated   Sit to Stand 5 reps    Sit to Stand Limitations no UE assist      Lumbar Exercises: Supine   Other Supine Lumbar Exercises hip abduction in hooklying holding one LE still moving opposite 3 sec hold x 10 reps each LE blue TB distal thighs                       PT Long Term Goals - 05/16/20 1158      PT LONG TERM GOAL #1   Title The patient will be independent with HEP.    Time 6    Period Weeks    Status On-going    Target Date 06/26/20      PT LONG TERM GOAL #2   Title The patient will improve LE strength to 4+/5 to 5-/5 hip flexion, extension and abduction.    Time 6    Period Weeks    Status On-going    Target Date 06/26/20      PT LONG TERM GOAL #3   Title Patient to demonstrate  safe independent gait with assistive device as indicated for community distances    Time 6    Period Weeks    Status New  Target Date 06/26/20      PT LONG TERM GOAL #4   Title Patient to demonstrate sit to stand and stand to sit transfers independently and safely x 5 reps in 12 sec    Time 6    Period Weeks    Status New    Target Date 06/26/20      PT LONG TERM GOAL #5   Title Functional status measure increased to 23 indicating improved functional activities and ADL's    Time 6    Period Weeks    Status On-going    Target Date 06/26/20                 Plan - 05/16/20 1223    Clinical Impression Statement [name] demonstrates increased strength in bilat LE's; improved transfers and transitional movements; stability in standing and with gait improving; gait with straight cane progressing from walker. Excellent progress. Working toward goals of therapy. Will benefit from continued treatment to achieve maximum rehab potential.    Rehab Potential Good    PT Frequency 2x / week    PT Duration 6 weeks    PT Treatment/Interventions ADLs/Self Care Home Management;Gait training;Stair training;Functional mobility training;Therapeutic activities;Therapeutic exercise;Balance training;Patient/family education;Manual techniques;Taping;Cryotherapy;Electrical Stimulation;Iontophoresis 4mg /ml Dexamethasone;Moist Heat;Ultrasound;Neuromuscular re-education;Dry needling    PT Next Visit Plan Progress HEP to tolerance focusing on core stabilization; LE strengthening; improving functional mobility; gait training - working with patient to avoid overdoing exercises and activities in clinic    PT [place] and Agree with Plan of Care Patient           Patient will benefit from skilled therapeutic intervention in order to improve the following deficits and impairments:     Visit Diagnosis: Muscle weakness (generalized) - Plan: PT plan of care cert/re-cert  Other  abnormalities of gait and mobility - Plan: PT plan of care cert/re-cert  Acute low back pain with sciatica, sciatica laterality unspecified, unspecified back pain laterality - Plan: PT plan of care cert/re-cert  Unsteadiness on feet - Plan: PT plan of care cert/re-cert     Problem List Patient Active Problem List   Diagnosis Date Noted  . Lumbar herniated disc 02/01/2020  . Lumbar radiculopathy 05/22/2019  . Spastic gait 03/07/2019  . Myalgia 11/01/2018  . Chronic bilateral low back pain without sciatica 07/21/2018  . 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 CIDP (chronic inflammatory demyelinating polyneuropathy) ([place]) 12/30/2017  . Edema 03/01/2017  . Hypokalemia   . Slow transit constipation   . Steroid-induced hyperglycemia   . Urinary retention   . Accidental drug overdose   . Postoperative pain   . Hyponatremia   . Cervical myelopathy ([place]) 02/01/2017  . Shortness of breath at rest   . Hypoglycemia   . Spondylosis, cervical, with myelopathy   . Neuropathic pain   . Benign essential HTN   . Labile blood glucose   . Muscle spasm   . GAD (generalized anxiety disorder)   . Acute inflammatory demyelinating polyneuropathy ([place]) 01/11/2017  . Anxiety state   . Neurogenic bladder   . Depression 12/30/2016  . Leg weakness, bilateral 12/30/2016  . 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 GBS (Guillain Barre syndrome) ([place])   . AIDP (acute inflammatory demyelinating polyneuropathy) ([place]) 11/27/2016  . Weakness 11/20/2016  . Weakness of both arms 10/30/2016  . 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 Obstructive sleep apnea 03/24/2016  . Numbness 03/18/2016  . Mild nonproliferative diabetic  retinopathy of both eyes without macular edema associated with type 2 diabetes mellitus ([place]) 05/24/2015  . Nuclear cataract of both eyes 05/24/2015  . Diabetes mellitus without complication ([place]) 95/74/7340  . Chronic prostatitis  03/01/2015  . Eustachian tube dysfunction 02/12/2015  . Acute maxillary sinusitis 02/12/2015  . Wellness examination 08/22/2014  . Alkaline phosphatase elevation 08/22/2014  . 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 Benign non-nodular prostatic hyperplasia with lower urinary tract symptoms 01/31/2014  . Lesion of eyelid 09/26/2013  . Screening for prostate cancer 04/24/2013  . Diabetes ([place]) 06/16/2012  . ED (erectile dysfunction) of organic origin 06/03/2012  . Routine general medical examination at a health care facility 04/10/2012  . BPH (benign prostatic hyperplasia) 02/03/2010  . HEMOCCULT POSITIVE STOOL 05/25/2008  . ELBOW PAIN, RIGHT 07/04/2007  . Dyslipidemia 02/16/2007  . ANXIETY 02/16/2007  . ERECTILE DYSFUNCTION 02/16/2007  . Essential hypertension 02/16/2007  . TESTICULAR MASS, LEFT 02/16/2007  . KNEE PAIN, CHRONIC 02/16/2007  . Michell Heinrich OF 02/16/2007    Jenascia Bumpass Nilda Simmer PT, MPH  05/16/2020, 12:42 PM  [facility] [place], [place], [address] Phone: [phone]   Fax:  [phone]  Name: AVEDIS BEVIS MRN: [id number] Date of Birth: [birth year]

## 2020-05-17 ENCOUNTER — Ambulatory Visit (HOSPITAL_COMMUNITY)
Admission: RE | Admit: 2020-05-17 | Discharge: 2020-05-17 | Disposition: A | Discharge status: Home / Self Care | Payer: Medicare Other | Source: Ambulatory Visit | Attending: Neurology | Admitting: Neurology

## 2020-05-17 DIAGNOSIS — G6181 Chronic inflammatory demyelinating polyneuritis: Secondary | ICD-10-CM | POA: Diagnosis not present

## 2020-05-17 MED ORDER — IMMUNE GLOBULIN (HUMAN) 10 GM/100ML IV SOLN
1.0000 g/kg | INTRAVENOUS | Status: DC
  Administered 2020-05-17: 105 g via INTRAVENOUS
  Filled 2020-05-17: qty 50

## 2020-05-20 ENCOUNTER — Other Ambulatory Visit: Payer: Self-pay

## 2020-05-20 ENCOUNTER — Ambulatory Visit (INDEPENDENT_AMBULATORY_CARE_PROVIDER_SITE_OTHER): Payer: Medicare Other | Admitting: Physical Therapy

## 2020-05-20 DIAGNOSIS — R2689 Other abnormalities of gait and mobility: Secondary | ICD-10-CM

## 2020-05-20 DIAGNOSIS — M5440 Lumbago with sciatica, unspecified side: Secondary | ICD-10-CM | POA: Diagnosis not present

## 2020-05-20 DIAGNOSIS — M6281 Muscle weakness (generalized): Secondary | ICD-10-CM

## 2020-05-20 DIAGNOSIS — R2681 Unsteadiness on feet: Secondary | ICD-10-CM | POA: Diagnosis not present

## 2020-05-20 NOTE — Therapy (Signed)
[place], [place], [address] Phone: [phone]   Fax:  [phone]  Physical Therapy Treatment  Patient Details  Name: Kenneth Pace MRN: [id number] Date of Birth: 19-Sep-1950 Referring Provider (PT): Consuella Lose, MD   Encounter Date: 05/20/2020   PT End of Session - 05/20/20 1100    Visit Number 10    Number of Visits 24    Date for PT Re-Evaluation 06/26/20    Progress Note Due on Visit 19   medicare progress note sent on visit #9; next note due at visit #19   PT Start Time 1014    PT Stop Time 1055    PT Time Calculation (min) 41 min    Activity Tolerance Patient tolerated treatment well    Behavior During Therapy [facility] for tasks assessed/performed           Past Medical History:  Diagnosis Date  . ADENOIDECTOMY, HX OF 02/16/2007  . Anxiety    pt. states he does not have anxiety.  Marland Kitchen BENIGN PROSTATIC HYPERTROPHY, WITH OBSTRUCTION 02/03/2010  . 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 DIABETES MELLITUS, TYPE I, UNCONTROLLED 12/29/2006  . ERECTILE DYSFUNCTION 02/16/2007  . Hearing loss    wears hearing aids  . HYPERLIPIDEMIA 02/16/2007  . HYPERTENSION 02/16/2007  . Nerve pain    per patient "on lower back and both legs"  . Sleep apnea    uses CPAP  . TESTICULAR MASS, LEFT 02/16/2007  . Therapeutic opioid-induced constipation (OIC)     Past Surgical History:  Procedure Laterality Date  . ANTERIOR CERVICAL DECOMP/DISCECTOMY FUSION N/A 02/01/2017   Procedure: ANTERIOR CERVICAL DECOMPRESSION/DISCECTOMY FUSION CERVICAL THREE-FOUR , CERVICAL FOUR-FIVE  CERVICAL FIVE-SIX;  Surgeon: Consuella Lose, MD;  Location: [facility];  Service: Neurosurgery;  Laterality: N/A;  . APPENDECTOMY    . BACK SURGERY    . COLONOSCOPY  02/05/2010   [facility]  . EYE SURGERY Right    Dr. Katy Fitch  . KNEE SURGERY     2 Lknee arthroscopy, 3 R knee arthroscpoy   . KNEE SURGERY Right 11/12/2016  . LUMBAR FUSION  03/26/2020  . LUMBAR LAMINECTOMY/DECOMPRESSION MICRODISCECTOMY Right 05/22/2019   Procedure: LAMINOTOMY AND MICRODISCECTOMY RIGHT LUMBAR FOUR- LUMBAR FIVE;  Surgeon: Consuella Lose, MD;  Location: [facility];  Service: Neurosurgery;  Laterality: Right;  LAMINOTOMY AND MICRODISCECTOMY RIGHT LUMBAR FOUR- LUMBAR FIVE  . LUMBAR LAMINECTOMY/DECOMPRESSION MICRODISCECTOMY Right 02/01/2020   Procedure: REDO MICRODISCECTOMY RT LUMBAR FOUR-FIVE;  Surgeon: Consuella Lose, MD;  Location: [facility];  Service: Neurosurgery;  Laterality: Right;  posterior  . TONSILLECTOMY      There were no vitals filed for this visit.   Subjective Assessment - 05/20/20 1017    Subjective "I've cast aside the walker".  "I'm improving".  he does report he fell backwards in his gargage, landed on tailbone and elbows 3 days ago.    Pertinent History laminectomy april 2021 and again 12/21, 2018 cervical decompression    Patient Stated Goals improve strength, be able to get up and down better, community exercise    Currently in Pain? Yes    Pain Score 4     Pain Location Back    Pain Orientation Right;Left;Lower    Pain Descriptors / Indicators Dull;Aching    Aggravating Factors  overdoing exercises/ walking    Pain Relieving Factors medication, lying down, sitting upright with pillow behind back.  [facility] PT Assessment - 05/20/20 0001      Assessment   Medical Diagnosis L4-L5 discectomy/lumbar fusion    Referring Provider (PT) Consuella Lose, MD    Onset Date/Surgical Date 03/26/20    Hand Dominance Right    Next MD Visit 4/22    Prior Therapy yes      Transfers   Five time sit to stand comments  standard chair, no UE:  22.98 sec            [facility] Adult PT Treatment/Exercise - 05/20/20 0001      Lumbar Exercises: Stretches   Passive Hamstring Stretch Left;Right;3 reps;30 seconds   hooklying with strap   Single Knee to Chest Stretch Limitations  Pt briefly brings knee into chest in between hamstring stretch reps.      Lumbar Exercises: Aerobic   Nustep L5 x 5 min, L6 x 5 min      Lumbar Exercises: Standing   Other Standing Lumbar Exercises hip ext x 10 reps each leg, leaning on counter.   Up/down 3-6" steps x 5 stairs with BUE support, reciprocal pattern.    Other Standing Lumbar Exercises side stepping with increased step height Rt/Lt 8 ft x 2 reps, forward marching, and backward gait with light UE on counter (10 ft each direction) x 3 reps.  Braiding in back x 2 reps, braiding in front x 2 reps.      Lumbar Exercises: Seated   Sit to Stand 5 reps    Sit to Stand Limitations no UE assist.   standard chair.     Lumbar Exercises: Supine   Clam 10 reps;3 seconds   single leg, green band, TA engaged.   Bent Knee Raise 5 reps;2 seconds   each leg, limited lift   Bridge Limitations unable to tolerate even isometric without lifting.             PT Long Term Goals - 05/16/20 1158      PT LONG TERM GOAL #1   Title The patient will be independent with HEP.    Time 6    Period Weeks    Status On-going    Target Date 06/26/20      PT LONG TERM GOAL #2   Title The patient will improve LE strength to 4+/5 to 5-/5 hip flexion, extension and abduction.    Time 6    Period Weeks    Status On-going    Target Date 06/26/20      PT LONG TERM GOAL #3   Title Patient to demonstrate safe independent gait with assistive device as indicated for community distances    Time 6    Period Weeks    Status New    Target Date 06/26/20      PT LONG TERM GOAL #4   Title Patient to demonstrate sit to stand and stand to sit transfers independently and safely x 5 reps in 12 sec    Time 6    Period Weeks    Status New    Target Date 06/26/20      PT LONG TERM GOAL #5   Title Functional status measure increased to 23 indicating improved functional activities and ADL's    Time 6    Period Weeks    Status On-going    Target Date 06/26/20                  Plan - 05/20/20 1059    Clinical Impression Statement Pt  completed 5x STS in 22.98 seconds at standard seat; improved from last assessment.  Pt reported pain and difficulty with transition from sit to supine; pain eased with time. Pt tolerated standing exercises well.  Pt making progress towards LTGs.    Rehab Potential Good    PT Frequency 2x / week    PT Duration 6 weeks    PT Treatment/Interventions ADLs/Self Care Home Management;Gait training;Stair training;Functional mobility training;Therapeutic activities;Therapeutic exercise;Balance training;Patient/family education;Manual techniques;Taping;Cryotherapy;Electrical Stimulation;Iontophoresis 4mg /ml Dexamethasone;Moist Heat;Ultrasound;Neuromuscular re-education;Dry needling    PT Next Visit Plan Progress HEP to tolerance focusing on core stabilization; LE strengthening; improving functional mobility; gait training - working with patient to avoid overdoing exercises and activities in clinic    PT [place] and Agree with Plan of Care Patient           Patient will benefit from skilled therapeutic intervention in order to improve the following deficits and impairments:  Pain,Impaired flexibility,Hypomobility,Postural dysfunction,Decreased strength,Decreased range of motion,Difficulty walking,Decreased activity tolerance,Decreased balance  Visit Diagnosis: Muscle weakness (generalized)  Other abnormalities of gait and mobility  Acute low back pain with sciatica, sciatica laterality unspecified, unspecified back pain laterality  Unsteadiness on feet     Problem List Patient Active Problem List   Diagnosis Date Noted  . Lumbar herniated disc 02/01/2020  . Lumbar radiculopathy 05/22/2019  . Spastic gait 03/07/2019  . Myalgia 11/01/2018  . Chronic bilateral low back pain without sciatica 07/21/2018  . 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 CIDP (chronic inflammatory  demyelinating polyneuropathy) ([place]) 12/30/2017  . Edema 03/01/2017  . Hypokalemia   . Slow transit constipation   . Steroid-induced hyperglycemia   . Urinary retention   . Accidental drug overdose   . Postoperative pain   . Hyponatremia   . Cervical myelopathy ([place]) 02/01/2017  . Shortness of breath at rest   . Hypoglycemia   . Spondylosis, cervical, with myelopathy   . Neuropathic pain   . Benign essential HTN   . Labile blood glucose   . Muscle spasm   . GAD (generalized anxiety disorder)   . Acute inflammatory demyelinating polyneuropathy ([place]) 01/11/2017  . Anxiety state   . Neurogenic bladder   . Depression 12/30/2016  . Leg weakness, bilateral 12/30/2016  . 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 GBS (Guillain Barre syndrome) ([place])   . AIDP (acute inflammatory demyelinating polyneuropathy) ([place]) 11/27/2016  . Weakness 11/20/2016  . Weakness of both arms 10/30/2016  . 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 Obstructive sleep apnea 03/24/2016  . Numbness 03/18/2016  . Mild nonproliferative diabetic retinopathy of both eyes without macular edema associated with type 2 diabetes mellitus ([place]) 05/24/2015  . Nuclear cataract of both eyes 05/24/2015  . Diabetes mellitus without complication ([place]) 97/98/9211  . 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 Eustachian tube dysfunction 02/12/2015  . Acute maxillary sinusitis 02/12/2015  . Wellness examination 08/22/2014  . Alkaline phosphatase elevation 08/22/2014  . 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 Benign non-nodular prostatic hyperplasia with lower urinary tract symptoms 01/31/2014  . Lesion of eyelid 09/26/2013  . Screening for prostate cancer 04/24/2013  . Diabetes ([place]) 06/16/2012  . ED (erectile dysfunction) of organic origin 06/03/2012  . Routine general medical examination at a health care facility 04/10/2012  . BPH (benign prostatic hyperplasia)  02/03/2010  . HEMOCCULT POSITIVE STOOL 05/25/2008  . ELBOW PAIN, RIGHT 07/04/2007  . Dyslipidemia 02/16/2007  . ANXIETY 02/16/2007  . ERECTILE DYSFUNCTION  02/16/2007  . Essential hypertension 02/16/2007  . TESTICULAR MASS, LEFT 02/16/2007  . KNEE PAIN, CHRONIC 02/16/2007  . Michell Heinrich OF 02/16/2007   Kerin Perna, PTA 05/20/20 11:02 AM  [place], [place], [address] Phone: [phone]   Fax:  [phone]  Name: Kenneth Pace MRN: [id number] Date of Birth: 11/08/1950

## 2020-05-23 ENCOUNTER — Ambulatory Visit (INDEPENDENT_AMBULATORY_CARE_PROVIDER_SITE_OTHER): Payer: Medicare Other | Admitting: Physical Therapy

## 2020-05-23 ENCOUNTER — Other Ambulatory Visit: Payer: Self-pay

## 2020-05-23 DIAGNOSIS — M5440 Lumbago with sciatica, unspecified side: Secondary | ICD-10-CM

## 2020-05-23 DIAGNOSIS — R2689 Other abnormalities of gait and mobility: Secondary | ICD-10-CM | POA: Diagnosis not present

## 2020-05-23 DIAGNOSIS — M6281 Muscle weakness (generalized): Secondary | ICD-10-CM | POA: Diagnosis present

## 2020-05-23 DIAGNOSIS — R2681 Unsteadiness on feet: Secondary | ICD-10-CM | POA: Diagnosis not present

## 2020-05-23 NOTE — Therapy (Signed)
[facility] [place], [place], [address] Phone: [phone]   Fax:  [phone]  Physical Therapy Treatment  Patient Details  Name: Kenneth Pace MRN: [id number] Date of Birth: [birth year] Referring Provider (PT): Consuella Lose, MD   Encounter Date: 05/23/2020   PT End of Session - 05/23/20 1017    Visit Number 11    Number of Visits 24    Date for PT Re-Evaluation 06/26/20    Progress Note Due on Visit 19   medicare progress note sent on visit #9; next note due at visit #19   PT Start Time 1014    PT Stop Time 1054    PT Time Calculation (min) 40 min    Activity Tolerance Patient tolerated treatment well    Behavior During Therapy [facility] for tasks assessed/performed           Past Medical History:  Diagnosis Date  . ADENOIDECTOMY, HX OF 02/16/2007  . Anxiety    pt. states he does not have anxiety.  Marland Kitchen BENIGN PROSTATIC HYPERTROPHY, WITH OBSTRUCTION 02/03/2010  . 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 DIABETES MELLITUS, TYPE I, UNCONTROLLED 12/29/2006  . ERECTILE DYSFUNCTION 02/16/2007  . Hearing loss    wears hearing aids  . HYPERLIPIDEMIA 02/16/2007  . HYPERTENSION 02/16/2007  . Nerve pain    per patient "on lower back and both legs"  . Sleep apnea    uses CPAP  . TESTICULAR MASS, LEFT 02/16/2007  . Therapeutic opioid-induced constipation (OIC)     Past Surgical History:  Procedure Laterality Date  . ANTERIOR CERVICAL DECOMP/DISCECTOMY FUSION N/A 02/01/2017   Procedure: ANTERIOR CERVICAL DECOMPRESSION/DISCECTOMY FUSION CERVICAL THREE-FOUR , CERVICAL FOUR-FIVE  CERVICAL FIVE-SIX;  Surgeon: Consuella Lose, MD;  Location: [place];  Service: Neurosurgery;  Laterality: N/A;  . APPENDECTOMY    . BACK SURGERY    . COLONOSCOPY  02/05/2010   [facility]  . EYE SURGERY Right    Dr. Katy Fitch  . KNEE SURGERY     2 Lknee arthroscopy, 3 R knee arthroscpoy   . KNEE SURGERY Right 11/12/2016  . LUMBAR FUSION  03/26/2020  . LUMBAR LAMINECTOMY/DECOMPRESSION MICRODISCECTOMY Right 05/22/2019   Procedure: LAMINOTOMY AND MICRODISCECTOMY RIGHT LUMBAR FOUR- LUMBAR FIVE;  Surgeon: Consuella Lose, MD;  Location: [place];  Service: Neurosurgery;  Laterality: Right;  LAMINOTOMY AND MICRODISCECTOMY RIGHT LUMBAR FOUR- LUMBAR FIVE  . LUMBAR LAMINECTOMY/DECOMPRESSION MICRODISCECTOMY Right 02/01/2020   Procedure: REDO MICRODISCECTOMY RT LUMBAR FOUR-FIVE;  Surgeon: Consuella Lose, MD;  Location: [place];  Service: Neurosurgery;  Laterality: Right;  posterior  . TONSILLECTOMY      There were no vitals filed for this visit.   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  I overdid it. I did too much". He had pain up to 8-9/10 next day.  He reports his progress is not as fast as he'd like.    Pertinent History laminectomy april 2021 and again 12/21, 2018 cervical decompression    Patient Stated Goals improve strength, be able to get up and down better, community exercise    Currently in Pain? Yes    Pain Score 4     Pain Location Back    Pain Orientation Right;Left;Lower    Pain Descriptors / Indicators Aching              [facility] PT Assessment - 05/23/20 0001  Assessment   Medical Diagnosis L4-L5 discectomy/lumbar fusion    Referring Provider (PT) Consuella Lose, MD    Onset Date/Surgical Date 03/26/20    Hand Dominance Right    Next MD Visit 4/22    Prior Therapy yes            [facility] Adult PT Treatment/Exercise - 05/23/20 0001      Lumbar Exercises: Stretches   Other Lumbar Stretch Exercise standing at counter completing side stretch, with arm sliding up cabinet x 5-10 sec x 2 reps each right / left.      Lumbar Exercises: Aerobic   Nustep L6: x 7 min (arms/legs)      Lumbar Exercises: Standing   Other Standing Lumbar Exercises       Lumbar Exercises: Seated   Sit  to Stand 5 reps   standard chair, no arms.   Other Seated Lumbar Exercises row and shoulder ext with blue band x 10 reps each.  Seated with one leg off mat (similar to hip flexor stretch) with 4# wt lift overhead x 6 each side.      Lumbar Exercises: Quadruped   Single Arm Raise Right;Left;5 reps;2 seconds    Straight Leg Raise 5 reps;2 seconds each leg.  Toe on mat, cues to straighten knee.           Balance Exercises - 05/23/20 0001      Balance Exercises: Standing   Stepping Strategy Foam/compliant surface;Anterior;Posterior;Lateral;5 reps    Rockerboard Anterior/posterior;Lateral;EO    Retro Gait 3 reps   intermittent UE to steady 10 ft   Marching Intermittent upper extremity assist;Solid surface;Forwards   10 ft. 3 reps            PT Long Term Goals - 05/16/20 1158      PT LONG TERM GOAL #1   Title The patient will be independent with HEP.    Time 6    Period Weeks    Status On-going    Target Date 06/26/20      PT LONG TERM GOAL #2   Title The patient will improve LE strength to 4+/5 to 5-/5 hip flexion, extension and abduction.    Time 6    Period Weeks    Status On-going    Target Date 06/26/20      PT LONG TERM GOAL #3   Title Patient to demonstrate safe independent gait with assistive device as indicated for community distances    Time 6    Period Weeks    Status New    Target Date 06/26/20      PT LONG TERM GOAL #4   Title Patient to demonstrate sit to stand and stand to sit transfers independently and safely x 5 reps in 12 sec    Time 6    Period Weeks    Status New    Target Date 06/26/20      PT LONG TERM GOAL #5   Title Functional status measure increased to 23 indicating improved functional activities and ADL's    Time 6    Period Weeks    Status On-going    Target Date 06/26/20                 Plan - 05/23/20 1105    Clinical Impression Statement Pt tolerated new balance/ strengthening exercises well, with mild increase in back  pain that reduces once pt sits and rests.  Making good progress towards goals each visit.  Rehab Potential Good    PT Frequency 2x / week    PT Duration 6 weeks    PT Treatment/Interventions ADLs/Self Care Home Management;Gait training;Stair training;Functional mobility training;Therapeutic activities;Therapeutic exercise;Balance training;Patient/family education;Manual techniques;Taping;Cryotherapy;Electrical Stimulation;Iontophoresis 4mg /ml Dexamethasone;Moist Heat;Ultrasound;Neuromuscular re-education;Dry needling    PT Next Visit Plan Progress HEP to tolerance focusing on core stabilization; LE strengthening; improving functional mobility; gait training - working with patient to avoid overdoing exercises and activities in clinic    PT [place] and Agree with Plan of Care Patient           Patient will benefit from skilled therapeutic intervention in order to improve the following deficits and impairments:  Pain,Impaired flexibility,Hypomobility,Postural dysfunction,Decreased strength,Decreased range of motion,Difficulty walking,Decreased activity tolerance,Decreased balance  Visit Diagnosis: Muscle weakness (generalized)  Other abnormalities of gait and mobility  Acute low back pain with sciatica, sciatica laterality unspecified, unspecified back pain laterality  Unsteadiness on feet     Problem List Patient Active Problem List   Diagnosis Date Noted  . Lumbar herniated disc 02/01/2020  . Lumbar radiculopathy 05/22/2019  . Spastic gait 03/07/2019  . Myalgia 11/01/2018  . Chronic bilateral low back pain without sciatica 07/21/2018  . 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 CIDP (chronic inflammatory demyelinating polyneuropathy) ([place]) 12/30/2017  . Edema 03/01/2017  . Hypokalemia   . Slow transit constipation   . Steroid-induced hyperglycemia   . Urinary retention   . Accidental drug overdose   . Postoperative pain   . Hyponatremia   .  Cervical myelopathy ([place]) 02/01/2017  . Shortness of breath at rest   . Hypoglycemia   . Spondylosis, cervical, with myelopathy   . Neuropathic pain   . Benign essential HTN   . Labile blood glucose   . Muscle spasm   . GAD (generalized anxiety disorder)   . Acute inflammatory demyelinating polyneuropathy ([place]) 01/11/2017  . Anxiety state   . Neurogenic bladder   . Depression 12/30/2016  . Leg weakness, bilateral 12/30/2016  . 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 GBS (Guillain Barre syndrome) ([place])   . AIDP (acute inflammatory demyelinating polyneuropathy) ([place]) 11/27/2016  . Weakness 11/20/2016  . Weakness of both arms 10/30/2016  . 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 Obstructive sleep apnea 03/24/2016  . Numbness 03/18/2016  . Mild nonproliferative diabetic retinopathy of both eyes without macular edema associated with type 2 diabetes mellitus ([place]) 05/24/2015  . Nuclear cataract of both eyes 05/24/2015  . Diabetes mellitus without complication ([place]) 61/44/3154  . 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 Eustachian tube dysfunction 02/12/2015  . Acute maxillary sinusitis 02/12/2015  . Wellness examination 08/22/2014  . Alkaline phosphatase elevation 08/22/2014  . 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 Benign non-nodular prostatic hyperplasia with lower urinary tract symptoms 01/31/2014  . Lesion of eyelid 09/26/2013  . Screening for prostate cancer 04/24/2013  . Diabetes ([place]) 06/16/2012  . ED (erectile dysfunction) of organic origin 06/03/2012  . Routine general medical examination at a health care facility 04/10/2012  . BPH (benign prostatic hyperplasia) 02/03/2010  . HEMOCCULT POSITIVE STOOL 05/25/2008  . ELBOW PAIN, RIGHT 07/04/2007  . Dyslipidemia 02/16/2007  . ANXIETY 02/16/2007  . ERECTILE DYSFUNCTION 02/16/2007  . Essential hypertension 02/16/2007  . TESTICULAR MASS, LEFT 02/16/2007  . KNEE  PAIN, CHRONIC 02/16/2007  . ADENOIDECTOMY, HX OF 02/16/2007   Kerin Perna, PTA 05/23/20 11:19 AM  [facility]  [address]  [place], [place], [address] Phone: [phone]   Fax:  [phone]  Name: Kenneth Pace MRN: [id number] Date of Birth: 07/16/1950

## 2020-05-27 ENCOUNTER — Ambulatory Visit (INDEPENDENT_AMBULATORY_CARE_PROVIDER_SITE_OTHER): Payer: Medicare Other | Admitting: Physical Therapy

## 2020-05-27 ENCOUNTER — Other Ambulatory Visit: Payer: Self-pay

## 2020-05-27 DIAGNOSIS — M6281 Muscle weakness (generalized): Secondary | ICD-10-CM | POA: Diagnosis present

## 2020-05-27 DIAGNOSIS — M5440 Lumbago with sciatica, unspecified side: Secondary | ICD-10-CM

## 2020-05-27 DIAGNOSIS — R2689 Other abnormalities of gait and mobility: Secondary | ICD-10-CM | POA: Diagnosis not present

## 2020-05-27 DIAGNOSIS — R2681 Unsteadiness on feet: Secondary | ICD-10-CM

## 2020-05-27 NOTE — Therapy (Signed)
[place], [place], [address] Phone: [phone]   Fax:  [phone]  Physical Therapy Treatment  Patient Details  Name: Kenneth Pace MRN: [id number] Date of Birth: 1950/12/27 Referring Provider (PT): Consuella Lose, MD   Encounter Date: 05/27/2020   PT End of Session - 05/27/20 1023    Visit Number 12    Number of Visits 24    Date for PT Re-Evaluation 06/26/20    Progress Note Due on Visit 19   medicare progress note sent on visit #9; next note due at visit #19   PT Start Time 1016    PT Stop Time 1058    PT Time Calculation (min) 42 min    Activity Tolerance Patient tolerated treatment well    Behavior During Therapy [facility] for tasks assessed/performed           Past Medical History:  Diagnosis Date  . ADENOIDECTOMY, HX OF 02/16/2007  . Anxiety    pt. states he does not have anxiety.  Marland Kitchen BENIGN PROSTATIC HYPERTROPHY, WITH OBSTRUCTION 02/03/2010  . 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 DIABETES MELLITUS, TYPE I, UNCONTROLLED 12/29/2006  . ERECTILE DYSFUNCTION 02/16/2007  . Hearing loss    wears hearing aids  . HYPERLIPIDEMIA 02/16/2007  . HYPERTENSION 02/16/2007  . Nerve pain    per patient "on lower back and both legs"  . Sleep apnea    uses CPAP  . TESTICULAR MASS, LEFT 02/16/2007  . Therapeutic opioid-induced constipation (OIC)     Past Surgical History:  Procedure Laterality Date  . ANTERIOR CERVICAL DECOMP/DISCECTOMY FUSION N/A 02/01/2017   Procedure: ANTERIOR CERVICAL DECOMPRESSION/DISCECTOMY FUSION CERVICAL THREE-FOUR , CERVICAL FOUR-FIVE  CERVICAL FIVE-SIX;  Surgeon: Consuella Lose, MD;  Location: [facility];  Service: Neurosurgery;  Laterality: N/A;  . APPENDECTOMY    . BACK SURGERY    . COLONOSCOPY  02/05/2010   [facility]  . EYE SURGERY Right    Dr. Katy Fitch  . KNEE SURGERY     2 Lknee arthroscopy, 3 R knee arthroscpoy   . KNEE SURGERY Right 11/12/2016  . LUMBAR FUSION  03/26/2020  . LUMBAR LAMINECTOMY/DECOMPRESSION MICRODISCECTOMY Right 05/22/2019   Procedure: LAMINOTOMY AND MICRODISCECTOMY RIGHT LUMBAR FOUR- LUMBAR FIVE;  Surgeon: Consuella Lose, MD;  Location: [facility];  Service: Neurosurgery;  Laterality: Right;  LAMINOTOMY AND MICRODISCECTOMY RIGHT LUMBAR FOUR- LUMBAR FIVE  . LUMBAR LAMINECTOMY/DECOMPRESSION MICRODISCECTOMY Right 02/01/2020   Procedure: REDO MICRODISCECTOMY RT LUMBAR FOUR-FIVE;  Surgeon: Consuella Lose, MD;  Location: [facility];  Service: Neurosurgery;  Laterality: Right;  posterior  . TONSILLECTOMY      There were no vitals filed for this visit.   Subjective Assessment - 05/27/20 1023    Subjective Pt reports he took his muscle relaxer but not pain medicine at bed last night, to see how he would do. "Pain was just a smidge more"    Pertinent History laminectomy april 2021 and again 12/21, 2018 cervical decompression    Patient Stated Goals improve strength, be able to get up and down better, community exercise    Currently in Pain? Yes    Pain Score 2     Pain Location Back              [facility] PT Assessment - 05/27/20 0001      Assessment   Medical Diagnosis L4-L5 discectomy/lumbar fusion    Referring Provider (PT) Consuella Lose, MD    Onset Date/Surgical Date  03/26/20    Hand Dominance Right    Next MD Visit 06/05/20    Prior Therapy yes            [facility] Adult PT Treatment/Exercise - 05/27/20 0001      Lumbar Exercises: Stretches   Other Lumbar Stretch Exercise seated side stretch with arm overhead. Standing side stretch with arm on cabinet, other hand on counter.   Seated hamstring stretch with straight back x 2 reps of 20 sec      Lumbar Exercises: Aerobic   Recumbent Bike L1: 5 min      Lumbar Exercises: Seated   Sit to Stand 10 reps holding 5# KB, to elevated surface.    Other Seated Lumbar Exercises 4.4# in/out from core      Lumbar Exercises:  Quadruped   Single Arm Raise Right;Left;2 seconds   2 reps   Straight Leg Raise 2 seconds   2 reps   Opposite Arm/Leg Raise Right arm/Left leg;Left arm/Right leg           Balance Exercises - 05/27/20 0001      Balance Exercises: Standing   Stepping Strategy Lateral;Foam/compliant surface;5 reps   3" foam on 3" step.   Rockerboard Anterior/posterior;Lateral;EO    Retro Gait 3 reps   intermittent UE to steady 10 ft   Sidestepping Upper extremity support;2 reps    Marching Intermittent upper extremity assist;Solid surface;Forwards   10 ft. 3 reps         SLS forward leans to touch arms of chair x 5 each leg (challenging). Intermittent UE support on window ledge in room.     PT Long Term Goals - 05/16/20 1158      PT LONG TERM GOAL #1   Title The patient will be independent with HEP.    Time 6    Period Weeks    Status On-going    Target Date 06/26/20      PT LONG TERM GOAL #2   Title The patient will improve LE strength to 4+/5 to 5-/5 hip flexion, extension and abduction.    Time 6    Period Weeks    Status On-going    Target Date 06/26/20      PT LONG TERM GOAL #3   Title Patient to demonstrate safe independent gait with assistive device as indicated for community distances    Time 6    Period Weeks    Status New    Target Date 06/26/20      PT LONG TERM GOAL #4   Title Patient to demonstrate sit to stand and stand to sit transfers independently and safely x 5 reps in 12 sec    Time 6    Period Weeks    Status New    Target Date 06/26/20      PT LONG TERM GOAL #5   Title Functional status measure increased to 23 indicating improved functional activities and ADL's    Time 6    Period Weeks    Status On-going    Target Date 06/26/20                 Plan - 05/27/20 1055    Clinical Impression Statement Pt able to complete opp arm/leg in quadruped today.  Able to complete more exercises with less seated rest breaks than last session.  Balance on  Rocker board improved today.  Pt reported increase in back pain up to 7/10 during session (reported afterwards), reduced with  seated rest.  Pt progressing well towards goals.    Rehab Potential Good    PT Frequency 2x / week    PT Duration 6 weeks    PT Treatment/Interventions ADLs/Self Care Home Management;Gait training;Stair training;Functional mobility training;Therapeutic activities;Therapeutic exercise;Balance training;Patient/family education;Manual techniques;Taping;Cryotherapy;Electrical Stimulation;Iontophoresis 4mg /ml Dexamethasone;Moist Heat;Ultrasound;Neuromuscular re-education;Dry needling    PT Next Visit Plan Progress HEP to tolerance focusing on core stabilization; LE strengthening; improving functional mobility; gait training - working with patient to avoid overdoing exercises and activities in clinic    PT [place] and Agree with Plan of Care Patient           Patient will benefit from skilled therapeutic intervention in order to improve the following deficits and impairments:  Pain,Impaired flexibility,Hypomobility,Postural dysfunction,Decreased strength,Decreased range of motion,Difficulty walking,Decreased activity tolerance,Decreased balance  Visit Diagnosis: Muscle weakness (generalized)  Other abnormalities of gait and mobility  Acute low back pain with sciatica, sciatica laterality unspecified, unspecified back pain laterality  Unsteadiness on feet     Problem List Patient Active Problem List   Diagnosis Date Noted  . Lumbar herniated disc 02/01/2020  . Lumbar radiculopathy 05/22/2019  . Spastic gait 03/07/2019  . Myalgia 11/01/2018  . Chronic bilateral low back pain without sciatica 07/21/2018  . 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 CIDP (chronic inflammatory demyelinating polyneuropathy) ([place]) 12/30/2017  . Edema 03/01/2017  . Hypokalemia   . Slow transit constipation   . Steroid-induced hyperglycemia   . Urinary  retention   . Accidental drug overdose   . Postoperative pain   . Hyponatremia   . Cervical myelopathy ([place]) 02/01/2017  . Shortness of breath at rest   . Hypoglycemia   . Spondylosis, cervical, with myelopathy   . Neuropathic pain   . Benign essential HTN   . Labile blood glucose   . Muscle spasm   . GAD (generalized anxiety disorder)   . Acute inflammatory demyelinating polyneuropathy ([place]) 01/11/2017  . Anxiety state   . Neurogenic bladder   . Depression 12/30/2016  . Leg weakness, bilateral 12/30/2016  . 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 GBS (Guillain Barre syndrome) ([place])   . AIDP (acute inflammatory demyelinating polyneuropathy) ([place]) 11/27/2016  . Weakness 11/20/2016  . Weakness of both arms 10/30/2016  . 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 Obstructive sleep apnea 03/24/2016  . Numbness 03/18/2016  . Mild nonproliferative diabetic retinopathy of both eyes without macular edema associated with type 2 diabetes mellitus ([place]) 05/24/2015  . Nuclear cataract of both eyes 05/24/2015  . Diabetes mellitus without complication ([place]) 35/00/9381  . 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 Eustachian tube dysfunction 02/12/2015  . Acute maxillary sinusitis 02/12/2015  . Wellness examination 08/22/2014  . Alkaline phosphatase elevation 08/22/2014  . 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 Benign non-nodular prostatic hyperplasia with lower urinary tract symptoms 01/31/2014  . Lesion of eyelid 09/26/2013  . Screening for prostate cancer 04/24/2013  . Diabetes ([place]) 06/16/2012  . ED (erectile dysfunction) of organic origin 06/03/2012  . Routine general medical examination at a health care facility 04/10/2012  . BPH (benign prostatic hyperplasia) 02/03/2010  . HEMOCCULT POSITIVE STOOL 05/25/2008  . ELBOW PAIN, RIGHT 07/04/2007  . Dyslipidemia 02/16/2007  . ANXIETY 02/16/2007  . ERECTILE DYSFUNCTION  02/16/2007  . Essential hypertension 02/16/2007  . TESTICULAR MASS, LEFT 02/16/2007  . KNEE PAIN, CHRONIC 02/16/2007  . ADENOIDECTOMY, HX OF 02/16/2007   Kerin Perna, PTA 05/27/20 12:09 PM  [facility] [place], [place], [address] Phone: [phone]   Fax:  [phone]  Name: Kenneth Pace MRN: [id number] Date of Birth: 09-11-1950

## 2020-05-30 ENCOUNTER — Encounter: Payer: Self-pay | Admitting: Physical Therapy

## 2020-05-30 ENCOUNTER — Other Ambulatory Visit: Payer: Self-pay

## 2020-05-30 ENCOUNTER — Telehealth: Payer: Self-pay

## 2020-05-30 ENCOUNTER — Ambulatory Visit (INDEPENDENT_AMBULATORY_CARE_PROVIDER_SITE_OTHER): Payer: Medicare Other | Admitting: Physical Therapy

## 2020-05-30 DIAGNOSIS — R2681 Unsteadiness on feet: Secondary | ICD-10-CM

## 2020-05-30 DIAGNOSIS — E1042 Type 1 diabetes mellitus with diabetic polyneuropathy: Secondary | ICD-10-CM

## 2020-05-30 DIAGNOSIS — R2689 Other abnormalities of gait and mobility: Secondary | ICD-10-CM

## 2020-05-30 DIAGNOSIS — M6281 Muscle weakness (generalized): Secondary | ICD-10-CM

## 2020-05-30 DIAGNOSIS — M5440 Lumbago with sciatica, unspecified side: Secondary | ICD-10-CM

## 2020-05-30 NOTE — Telephone Encounter (Signed)
Pt is needing a C-peptide for [employer]. Will you be ok with ordering for pt?  Please advise

## 2020-05-30 NOTE — Telephone Encounter (Signed)
Ok, I ordered 

## 2020-05-30 NOTE — Therapy (Signed)
[place], [place], [address] Phone: [phone]   Fax:  [phone]  Physical Therapy Treatment  Patient Details  Name: Kenneth Pace MRN: [id number] Date of Birth: 04-07-1950 Referring Provider (PT): Consuella Lose, MD   Encounter Date: 05/30/2020   PT End of Session - 05/30/20 1110    Visit Number 13    Number of Visits 24    Date for PT Re-Evaluation 06/26/20    Progress Note Due on Visit 19   medicare progress note sent on visit #9; next note due at visit #19   PT Start Time 1104    PT Stop Time 1143    PT Time Calculation (min) 39 min    Activity Tolerance Patient tolerated treatment well    Behavior During Therapy [facility] for tasks assessed/performed           Past Medical History:  Diagnosis Date  . ADENOIDECTOMY, HX OF 02/16/2007  . Anxiety    pt. states he does not have anxiety.  Marland Kitchen BENIGN PROSTATIC HYPERTROPHY, WITH OBSTRUCTION 02/03/2010  . 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 DIABETES MELLITUS, TYPE I, UNCONTROLLED 12/29/2006  . ERECTILE DYSFUNCTION 02/16/2007  . Hearing loss    wears hearing aids  . HYPERLIPIDEMIA 02/16/2007  . HYPERTENSION 02/16/2007  . Nerve pain    per patient "on lower back and both legs"  . Sleep apnea    uses CPAP  . TESTICULAR MASS, LEFT 02/16/2007  . Therapeutic opioid-induced constipation (OIC)     Past Surgical History:  Procedure Laterality Date  . ANTERIOR CERVICAL DECOMP/DISCECTOMY FUSION N/A 02/01/2017   Procedure: ANTERIOR CERVICAL DECOMPRESSION/DISCECTOMY FUSION CERVICAL THREE-FOUR , CERVICAL FOUR-FIVE  CERVICAL FIVE-SIX;  Surgeon: Consuella Lose, MD;  Location: [place];  Service: Neurosurgery;  Laterality: N/A;  . APPENDECTOMY    . BACK SURGERY    . COLONOSCOPY  02/05/2010   [facility]  . EYE SURGERY Right    Dr. Katy Fitch  . KNEE SURGERY     2 Lknee arthroscopy, 3 R knee arthroscpoy   . KNEE SURGERY Right 11/12/2016  . LUMBAR FUSION  03/26/2020  . LUMBAR LAMINECTOMY/DECOMPRESSION MICRODISCECTOMY Right 05/22/2019   Procedure: LAMINOTOMY AND MICRODISCECTOMY RIGHT LUMBAR FOUR- LUMBAR FIVE;  Surgeon: Consuella Lose, MD;  Location: [place];  Service: Neurosurgery;  Laterality: Right;  LAMINOTOMY AND MICRODISCECTOMY RIGHT LUMBAR FOUR- LUMBAR FIVE  . LUMBAR LAMINECTOMY/DECOMPRESSION MICRODISCECTOMY Right 02/01/2020   Procedure: REDO MICRODISCECTOMY RT LUMBAR FOUR-FIVE;  Surgeon: Consuella Lose, MD;  Location: [place];  Service: Neurosurgery;  Laterality: Right;  posterior  . TONSILLECTOMY      There were no vitals filed for this visit.   Subjective Assessment - 05/30/20 1325    Subjective Pt reports no new changes since last visit. He continues to require UE assist to get legs out of car.    Pertinent History laminectomy april 2021 and again 12/21, 2018 cervical decompression    Patient Stated Goals improve strength, be able to get up and down better, community exercise    Currently in Pain? Yes    Pain Score 3     Pain Location Back    Pain Orientation Right;Left;Lower    Pain Descriptors / Indicators Aching    Aggravating Factors  overdoing it.    Pain Relieving Factors sitting upright with pillow behind back.              [facility] PT Assessment - 05/30/20 0001  Assessment   Medical Diagnosis L4-L5 discectomy/lumbar fusion    Referring Provider (PT) Consuella Lose, MD    Onset Date/Surgical Date 03/26/20    Hand Dominance Right    Next MD Visit 06/05/20    Prior Therapy yes      Observation/Other Assessments   Focus on Therapeutic Outcomes (FOTO)  Functional status: 65            [facility] Adult PT Treatment/Exercise - 05/30/20 0001      Lumbar Exercises: Aerobic   Nustep L6: x 7 min (arms/legs)      Lumbar Exercises: Standing   Wall Slides 10 reps;3 seconds   2# in/out from core   Shoulder Extension Strengthening;Both;10 reps    Theraband Level  (Shoulder Extension) Level 2 (Red)   core engaged.   Other Standing Lumbar Exercises anti-rotation side stepping holding red band x 7 reps each side.     Other Standing Lumbar Exercises to/from floor using heavy UE assist with cane and UE on table x 1 rep (to assess pt transition/transfer at home)     Lumbar Exercises: Seated   Long Arc Quad on [place] Right;Left;1 set;5 reps   sitting on dynadisc, light UE on table.   Hip Flexion on Ball Strengthening;10 reps   sitting on dynadisc, light UE support on table   Other Seated Lumbar Exercises sitting on dynadisc- pelvic tilts side to side and front to back, to tolerance           Balance Exercises - 05/30/20 0001      Balance Exercises: Standing   Standing Eyes Closed Foam/compliant surface;Narrow base of support (BOS);Wide (BOA);2 reps;10 secs    Tandem Stance Eyes open;Intermittent upper extremity support;2 reps;15 secs    Stepping Strategy Lateral;Foam/compliant surface;5 reps   intermittent support, with 2 sec of SLS                 PT Long Term Goals - 05/30/20 1150      PT LONG TERM GOAL #1   Title The patient will be independent with HEP.    Time 6    Period Weeks    Status On-going      PT LONG TERM GOAL #2   Title The patient will improve LE strength to 4+/5 to 5-/5 hip flexion, extension and abduction.    Time 6    Period Weeks    Status On-going      PT LONG TERM GOAL #3   Title Patient to demonstrate safe independent gait with assistive device as indicated for community distances    Time 6    Period Weeks    Status On-going      PT LONG TERM GOAL #4   Title Patient to demonstrate sit to stand and stand to sit transfers independently and safely x 5 reps in 12 sec    Time 6    Period Weeks    Status On-going      PT LONG TERM GOAL #5   Title Functional status measure increased to 23 indicating improved functional activities and ADL's    Time 6    Period Weeks    Status Achieved                  Plan - 05/30/20 1152    Clinical Impression Statement Pt's FOTO score improved to 59; has met FOTO goal. Pt tolerated increased standing time with exercises; reporting mild increase in pain during exercises. Pain returns to 3/10 with  seated rest. Seated hip flexion remains very challenging for pt. Session focused on core strengthening and balance.  Progressing well towards therapy goals.    Rehab Potential Good    PT Frequency 2x / week    PT Duration 6 weeks    PT Treatment/Interventions ADLs/Self Care Home Management;Gait training;Stair training;Functional mobility training;Therapeutic activities;Therapeutic exercise;Balance training;Patient/family education;Manual techniques;Taping;Cryotherapy;Electrical Stimulation;Iontophoresis 4mg /ml Dexamethasone;Moist Heat;Ultrasound;Neuromuscular re-education;Dry needling    PT Next Visit Plan MD NOTE (assess strength and goals). Progress HEP to tolerance focusing on core stabilization; LE strengthening; improving functional mobility; gait training - working with patient to avoid overdoing exercises and activities in clinic.    PT Home Exercise Plan [place] and Agree with Plan of Care Patient           Patient will benefit from skilled therapeutic intervention in order to improve the following deficits and impairments:  Pain,Impaired flexibility,Hypomobility,Postural dysfunction,Decreased strength,Decreased range of motion,Difficulty walking,Decreased activity tolerance,Decreased balance  Visit Diagnosis: Muscle weakness (generalized)  Other abnormalities of gait and mobility  Acute low back pain with sciatica, sciatica laterality unspecified, unspecified back pain laterality  Unsteadiness on feet     Problem List Patient Active Problem List   Diagnosis Date Noted  . Lumbar herniated disc 02/01/2020  . Lumbar radiculopathy 05/22/2019  . Spastic gait 03/07/2019  . Myalgia 11/01/2018  . Chronic bilateral low back pain without  sciatica 07/21/2018  . 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 CIDP (chronic inflammatory demyelinating polyneuropathy) ([place]) 12/30/2017  . Edema 03/01/2017  . Hypokalemia   . Slow transit constipation   . Steroid-induced hyperglycemia   . Urinary retention   . Accidental drug overdose   . Postoperative pain   . Hyponatremia   . Cervical myelopathy ([place]) 02/01/2017  . Shortness of breath at rest   . Hypoglycemia   . Spondylosis, cervical, with myelopathy   . Neuropathic pain   . Benign essential HTN   . Labile blood glucose   . Muscle spasm   . GAD (generalized anxiety disorder)   . Acute inflammatory demyelinating polyneuropathy ([place]) 01/11/2017  . Anxiety state   . Neurogenic bladder   . Depression 12/30/2016  . Leg weakness, bilateral 12/30/2016  . 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 GBS (Guillain Barre syndrome) ([place])   . AIDP (acute inflammatory demyelinating polyneuropathy) ([place]) 11/27/2016  . Weakness 11/20/2016  . Weakness of both arms 10/30/2016  . 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 Obstructive sleep apnea 03/24/2016  . Numbness 03/18/2016  . Mild nonproliferative diabetic retinopathy of both eyes without macular edema associated with type 2 diabetes mellitus ([place]) 05/24/2015  . Nuclear cataract of both eyes 05/24/2015  . Diabetes mellitus without complication ([place]) 02/54/2706  . 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 Eustachian tube dysfunction 02/12/2015  . Acute maxillary sinusitis 02/12/2015  . Wellness examination 08/22/2014  . Alkaline phosphatase elevation 08/22/2014  . 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 Benign non-nodular prostatic hyperplasia with lower urinary tract symptoms 01/31/2014  . Lesion of eyelid 09/26/2013  . Screening for prostate cancer 04/24/2013  . Diabetes ([place]) 06/16/2012  . ED (erectile dysfunction) of organic origin 06/03/2012  . Routine general medical  examination at a health care facility 04/10/2012  . BPH (benign prostatic hyperplasia) 02/03/2010  . HEMOCCULT POSITIVE STOOL 05/25/2008  . ELBOW PAIN, RIGHT 07/04/2007  . Dyslipidemia 02/16/2007  . ANXIETY 02/16/2007  . ERECTILE DYSFUNCTION 02/16/2007  . Essential hypertension 02/16/2007  . TESTICULAR MASS, LEFT  02/16/2007  . KNEE PAIN, CHRONIC 02/16/2007  . Michell Heinrich OF 02/16/2007   Kerin Perna, PTA 05/30/20 1:28 PM  [place], [place], [address] Phone: [phone]   Fax:  [phone]  Name: Kenneth Pace MRN: [id number] Date of Birth: Jul 29, 1950

## 2020-05-30 NOTE — Addendum Note (Signed)
Addended by: Renato Shin on: 05/30/2020 04:08 PM   Modules accepted: Orders

## 2020-05-30 NOTE — Telephone Encounter (Signed)
Spoke with pt in regards to having his C-peptide dram and he stated that he will call Monday to get set up for the Nurse visit for a lab draw.

## 2020-06-03 ENCOUNTER — Encounter: Payer: Self-pay | Admitting: Physical Therapy

## 2020-06-03 ENCOUNTER — Other Ambulatory Visit: Payer: Self-pay

## 2020-06-03 ENCOUNTER — Ambulatory Visit (INDEPENDENT_AMBULATORY_CARE_PROVIDER_SITE_OTHER): Payer: Medicare Other | Admitting: Physical Therapy

## 2020-06-03 DIAGNOSIS — R2681 Unsteadiness on feet: Secondary | ICD-10-CM | POA: Diagnosis not present

## 2020-06-03 DIAGNOSIS — R2689 Other abnormalities of gait and mobility: Secondary | ICD-10-CM | POA: Diagnosis not present

## 2020-06-03 DIAGNOSIS — M6281 Muscle weakness (generalized): Secondary | ICD-10-CM

## 2020-06-03 DIAGNOSIS — M5440 Lumbago with sciatica, unspecified side: Secondary | ICD-10-CM

## 2020-06-03 NOTE — Therapy (Signed)
[place], [place], [address] Phone: [phone]   Fax:  [phone]  Physical Therapy Treatment  Patient Details  Name: Kenneth Pace MRN: [id number] Date of Birth: 20-Jun-1950 Referring Provider (PT): Consuella Lose, MD   Encounter Date: 06/03/2020   PT End of Session - 06/03/20 1154    Visit Number 14    Number of Visits 24    Date for PT Re-Evaluation 06/26/20    Progress Note Due on Visit 33    PT Start Time 1153    PT Stop Time 1233    PT Time Calculation (min) 40 min    Equipment Utilized During Treatment Back brace    Activity Tolerance Patient tolerated treatment well    Behavior During Therapy [facility] for tasks assessed/performed           Past Medical History:  Diagnosis Date  . ADENOIDECTOMY, HX OF 02/16/2007  . Anxiety    pt. states he does not have anxiety.  Marland Kitchen BENIGN PROSTATIC HYPERTROPHY, WITH OBSTRUCTION 02/03/2010  . 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 DIABETES MELLITUS, TYPE I, UNCONTROLLED 12/29/2006  . ERECTILE DYSFUNCTION 02/16/2007  . Hearing loss    wears hearing aids  . HYPERLIPIDEMIA 02/16/2007  . HYPERTENSION 02/16/2007  . Nerve pain    per patient "on lower back and both legs"  . Sleep apnea    uses CPAP  . TESTICULAR MASS, LEFT 02/16/2007  . Therapeutic opioid-induced constipation (OIC)     Past Surgical History:  Procedure Laterality Date  . ANTERIOR CERVICAL DECOMP/DISCECTOMY FUSION N/A 02/01/2017   Procedure: ANTERIOR CERVICAL DECOMPRESSION/DISCECTOMY FUSION CERVICAL THREE-FOUR , CERVICAL FOUR-FIVE  CERVICAL FIVE-SIX;  Surgeon: Consuella Lose, MD;  Location: [place];  Service: Neurosurgery;  Laterality: N/A;  . APPENDECTOMY    . BACK SURGERY    . COLONOSCOPY  02/05/2010   [facility]  . EYE SURGERY Right    Dr. Katy Fitch  . KNEE SURGERY     2 Lknee arthroscopy, 3 R knee arthroscpoy  . KNEE SURGERY  Right 11/12/2016  . LUMBAR FUSION  03/26/2020  . LUMBAR LAMINECTOMY/DECOMPRESSION MICRODISCECTOMY Right 05/22/2019   Procedure: LAMINOTOMY AND MICRODISCECTOMY RIGHT LUMBAR FOUR- LUMBAR FIVE;  Surgeon: Consuella Lose, MD;  Location: [place];  Service: Neurosurgery;  Laterality: Right;  LAMINOTOMY AND MICRODISCECTOMY RIGHT LUMBAR FOUR- LUMBAR FIVE  . LUMBAR LAMINECTOMY/DECOMPRESSION MICRODISCECTOMY Right 02/01/2020   Procedure: REDO MICRODISCECTOMY RT LUMBAR FOUR-FIVE;  Surgeon: Consuella Lose, MD;  Location: [place];  Service: Neurosurgery;  Laterality: Right;  posterior  . TONSILLECTOMY      There were no vitals filed for this visit.   Subjective Assessment - 06/03/20 1155    Subjective Squatting is painful and difficult and getting up from the flooor. Going up stairs has improved to reciprocal gait. Still with some difficulty lifting right leg into car.    Pertinent History laminectomy april 2021 and again 12/21, 2018 cervical decompression    Patient Stated Goals improve strength, be able to get up and down better, community exercise    Currently in Pain? Yes    Pain Score 3     Pain Location Back    Pain Orientation Right;Left;Lower    Pain Descriptors / Indicators Aching    Pain Type Chronic pain;Surgical pain;Acute pain    Aggravating Factors  overdoing it    Pain Relieving Factors lumbar support  [facility] PT Assessment - 06/03/20 0001      Functional Tests   Functional tests Sit to Stand      Sit to Stand   Comments 13.64 sec      Strength   Right Hip Flexion 3+/5    Right Hip Extension 4/5   4+/5 after intial test   Right Hip ABduction 4+/5    Right Hip ADduction 4+/5    Left Hip Flexion 3+/5    Left Hip Extension 4+/5    Left Hip ABduction 4+/5    Left Hip ADduction 4+/5    Right Knee Flexion 5/5    Right Knee Extension 4+/5    Left Knee Flexion 5/5    Left Knee Extension 5/5    Left Ankle Dorsiflexion --   5-/5                         [facility] Adult PT Treatment/Exercise - 06/03/20 0001      Ambulation/Gait   Ambulation/Gait Yes    Ambulation/Gait Assistance 6: Modified independent (Device/Increase time)    Ambulation Distance (Feet) 360 Feet    Assistive device Straight cane    Gait Pattern Step-through pattern    Ambulation Surface Level    Gait velocity picks up as distance increases    Gait Comments without cane - intermittent decrease knee hip/knee flex in swing; occassional scissoring and LOB      Lumbar Exercises: Aerobic   Nustep L6: x 7 min (arms/legs)      Lumbar Exercises: Standing   Wall Slides 20 reps;2 seconds   4# in/out from core   Shoulder Extension Strengthening;Both;20 reps    Theraband Level (Shoulder Extension) Level 2 (Red)   core engaged.   Other Standing Lumbar Exercises anti-rotation side stepping holding red band x 5 reps      Lumbar Exercises: Seated   Sit to Stand 5 reps      Lumbar Exercises: Supine   Bridge 5 reps    Bridge Limitations still painful      Lumbar Exercises: Sidelying   Other Sidelying Lumbar Exercises hip flexion 2x10 bil                       PT Long Term Goals - 06/03/20 1159      PT LONG TERM GOAL #1   Title The patient will be independent with HEP.    Status On-going      PT LONG TERM GOAL #2   Title The patient will improve LE strength to 4+/5 to 5-/5 hip flexion, extension and abduction.    Status Partially Met      PT LONG TERM GOAL #3   Title Patient to demonstrate safe independent gait with assistive device as indicated for community distances    Status Partially Met      PT LONG TERM GOAL #4   Title Patient to demonstrate sit to stand and stand to sit transfers independently and safely x 5 reps in 12 sec    Baseline 13.64 sec    Status On-going      PT LONG TERM GOAL #5   Title Functional status measure increased to 23 indicating improved functional activities and ADL's    Status Achieved                  Plan - 06/03/20 1235    Clinical Impression Statement Overall Kenneth Pace is doing much better. His B   LE strength has improved in all areas except hip flexion. He continues to use his UE to assist with getting into the car and on to NuStep in the clinic. Patient demonstrates safe gait when using a SPC in his left UE. His gait is still unsteady without use of a SPC and he has decreased trunk rotation and intermittent decreased R knee flexion without the cane as well. Patient wants to carry the cane in his R UE vs L. PT explained reasoning and pt could see/feel the difference in steadiness with use. Pt does experience some increase in pain in his back with challenges to his core, but pain decreases with short rest. We have worked on functional transfers from floor to standing and this is very challenging. Patient is progressing well toward his LTGs but will benefit from continued PT to address remaining LE weakness and balance deficits affecting gait.    Comorbidities cervical decompression, CIDP, h/o frequent falls, prior back surgery 05/2019    Examination-Activity Limitations Lift;Locomotion Level;Stairs;Stand;Sit    PT Frequency 2x / week    PT Duration 6 weeks    PT Treatment/Interventions ADLs/Self Care Home Management;Gait training;Stair training;Functional mobility training;Therapeutic activities;Therapeutic exercise;Balance training;Patient/family education;Manual techniques;Taping;Cryotherapy;Electrical Stimulation;Iontophoresis 80m/ml Dexamethasone;Moist Heat;Ultrasound;Neuromuscular re-education;Dry needling    PT Next Visit Plan Progress HEP to tolerance focusing on core stabilization; LE strengthening; improving functional mobility; gait training - working with patient to avoid overdoing exercises and activities in clinic. Continue to encourage cane in L UE.    PT Home Exercise Plan [place] Agree with Plan of Care Patient           Patient will benefit from  skilled therapeutic intervention in order to improve the following deficits and impairments:  Pain,Impaired flexibility,Hypomobility,Postural dysfunction,Decreased strength,Decreased range of motion,Difficulty walking,Decreased activity tolerance,Decreased balance  Visit Diagnosis: Muscle weakness (generalized)  Other abnormalities of gait and mobility  Acute low back pain with sciatica, sciatica laterality unspecified, unspecified back pain laterality  Unsteadiness on feet     Problem List Patient Active Problem List   Diagnosis Date Noted  . Lumbar herniated disc 02/01/2020  . Lumbar radiculopathy 05/22/2019  . Spastic gait 03/07/2019  . Myalgia 11/01/2018  . Chronic bilateral low back pain without sciatica 07/21/2018  . 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 CIDP (chronic inflammatory demyelinating polyneuropathy) ([place] 12/30/2017  . Edema 03/01/2017  . Hypokalemia   . Slow transit constipation   . Steroid-induced hyperglycemia   . Urinary retention   . Accidental drug overdose   . Postoperative pain   . Hyponatremia   . Cervical myelopathy ([place] 02/01/2017  . Shortness of breath at rest   . Hypoglycemia   . Spondylosis, cervical, with myelopathy   . Neuropathic pain   . Benign essential HTN   . Labile blood glucose   . Muscle spasm   . GAD (generalized anxiety disorder)   . Acute inflammatory demyelinating polyneuropathy ([place] 01/11/2017  . Anxiety state   . Neurogenic bladder   . Depression 12/30/2016  . Leg weakness, bilateral 12/30/2016  . 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[place] 12/30/2016  . GBS (Guillain Barre syndrome) ([place]   . AIDP (acute inflammatory demyelinating polyneuropathy) ([place] 11/27/2016  . Weakness 11/20/2016  . Weakness of both arms 10/30/2016  . 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 Obstructive sleep apnea 03/24/2016  . Numbness 03/18/2016  . Mild nonproliferative diabetic retinopathy of both eyes  without macular edema associated with type 2 diabetes mellitus ([place] 05/24/2015  .  Nuclear cataract of both eyes 05/24/2015  . Diabetes mellitus without complication (HCC) 04/06/2015  . 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 Eustachian tube dysfunction 02/12/2015  . Acute maxillary sinusitis 02/12/2015  . Wellness examination 08/22/2014  . Alkaline phosphatase elevation 08/22/2014  . 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 Benign non-nodular prostatic hyperplasia with lower urinary tract symptoms 01/31/2014  . Lesion of eyelid 09/26/2013  . Screening for prostate cancer 04/24/2013  . Diabetes ([facility]) 06/16/2012  . ED (erectile dysfunction) of organic origin 06/03/2012  . Routine general medical examination at a health care facility 04/10/2012  . BPH (benign prostatic hyperplasia) 02/03/2010  . HEMOCCULT POSITIVE STOOL 05/25/2008  . ELBOW PAIN, RIGHT 07/04/2007  . Dyslipidemia 02/16/2007  . ANXIETY 02/16/2007  . ERECTILE DYSFUNCTION 02/16/2007  . Essential hypertension 02/16/2007  . TESTICULAR MASS, LEFT 02/16/2007  . KNEE PAIN, CHRONIC 02/16/2007  . ADENOIDECTOMY, HX OF 02/16/2007    Julie Riddles PT 06/03/2020, 1:13 PM  [facility] [address] [place], , [address] Phone: [phone]   Fax:  [phone]  Name: Kenneth Pace MRN: [id number] Date of Birth: 04/23/1950   

## 2020-06-06 ENCOUNTER — Encounter: Payer: Medicare Other | Admitting: Rehabilitative and Restorative Service Providers"

## 2020-06-10 ENCOUNTER — Telehealth: Payer: Self-pay | Admitting: Endocrinology

## 2020-06-10 NOTE — Telephone Encounter (Signed)
Kenneth Pace from  [employer]  Is calling regarding a sent fax that was sent over regarding needing chart notes, lab results for c-peptide, glucose,  physician with an order.  Kenneth Pace said that he would send over another fax.

## 2020-06-11 ENCOUNTER — Telehealth: Payer: Self-pay

## 2020-06-11 DIAGNOSIS — E1042 Type 1 diabetes mellitus with diabetic polyneuropathy: Secondary | ICD-10-CM

## 2020-06-11 NOTE — Telephone Encounter (Signed)
Please contact pt to schedule him for a lab draw for  C-peptide. Let him know that we can not complete paperwork for [employer] until that is done.

## 2020-06-11 NOTE — Telephone Encounter (Signed)
I do have the papers but pt has not scheduled for a lab draw for C-peptide yet. Waiting on pt.

## 2020-06-13 ENCOUNTER — Encounter: Payer: Self-pay | Admitting: Physical Therapy

## 2020-06-13 ENCOUNTER — Ambulatory Visit (INDEPENDENT_AMBULATORY_CARE_PROVIDER_SITE_OTHER): Payer: Medicare Other | Admitting: Physical Therapy

## 2020-06-13 ENCOUNTER — Other Ambulatory Visit: Payer: Self-pay

## 2020-06-13 ENCOUNTER — Other Ambulatory Visit (INDEPENDENT_AMBULATORY_CARE_PROVIDER_SITE_OTHER): Payer: Medicare Other

## 2020-06-13 DIAGNOSIS — M5440 Lumbago with sciatica, unspecified side: Secondary | ICD-10-CM | POA: Diagnosis not present

## 2020-06-13 DIAGNOSIS — M6281 Muscle weakness (generalized): Secondary | ICD-10-CM

## 2020-06-13 DIAGNOSIS — R2689 Other abnormalities of gait and mobility: Secondary | ICD-10-CM

## 2020-06-13 DIAGNOSIS — E1042 Type 1 diabetes mellitus with diabetic polyneuropathy: Secondary | ICD-10-CM | POA: Diagnosis not present

## 2020-06-13 DIAGNOSIS — R2681 Unsteadiness on feet: Secondary | ICD-10-CM

## 2020-06-13 LAB — BASIC METABOLIC PANEL
BUN: 18 mg/dL (ref 6–23)
CO2: 29 mEq/L (ref 19–32)
Calcium: 9.3 mg/dL (ref 8.4–10.5)
Chloride: 99 mEq/L (ref 96–112)
Creatinine, Ser: 0.76 mg/dL (ref 0.40–1.50)
GFR: 92.77 mL/min (ref >60.00)
Glucose, Bld: 282 mg/dL — ABNORMAL HIGH (ref 70–99)
Potassium: 3.6 mEq/L (ref 3.5–5.1)
Sodium: 134 mEq/L — ABNORMAL LOW (ref 135–145)

## 2020-06-13 NOTE — Therapy (Signed)
Kenneth Pace, [place], [address] Phone: [phone]   Fax:  [phone]  Physical Therapy Treatment  Patient Details  Name: Kenneth Pace MRN: [id number] Date of Birth: 12/23/1950 Referring Provider (PT): Consuella Lose, MD   Encounter Date: 06/13/2020   PT End of Session - 06/13/20 1014    Visit Number 15    Number of Visits 24    Date for PT Re-Evaluation 06/26/20    Progress Note Due on Visit 64    PT Start Time 1005    PT Stop Time 1050    PT Time Calculation (min) 45 min    Equipment Utilized During Treatment Back brace    Behavior During Therapy [facility] for tasks assessed/performed           Past Medical History:  Diagnosis Date  . ADENOIDECTOMY, HX OF 02/16/2007  . Anxiety    pt. states he does not have anxiety.  Kenneth Pace BENIGN PROSTATIC HYPERTROPHY, WITH OBSTRUCTION 02/03/2010  . 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 DIABETES MELLITUS, TYPE I, UNCONTROLLED 12/29/2006  . ERECTILE DYSFUNCTION 02/16/2007  . Hearing loss    wears hearing aids  . HYPERLIPIDEMIA 02/16/2007  . HYPERTENSION 02/16/2007  . Nerve pain    per patient "on lower back and both legs"  . Sleep apnea    uses CPAP  . TESTICULAR MASS, LEFT 02/16/2007  . Therapeutic opioid-induced constipation (OIC)     Past Surgical History:  Procedure Laterality Date  . ANTERIOR CERVICAL DECOMP/DISCECTOMY FUSION N/A 02/01/2017   Procedure: ANTERIOR CERVICAL DECOMPRESSION/DISCECTOMY FUSION CERVICAL THREE-FOUR , CERVICAL FOUR-FIVE  CERVICAL FIVE-SIX;  Surgeon: Consuella Lose, MD;  Location: [place];  Service: Neurosurgery;  Laterality: N/A;  . APPENDECTOMY    . BACK SURGERY    . COLONOSCOPY  02/05/2010   [facility]  . EYE SURGERY Right    Dr. Katy Fitch  . KNEE SURGERY     2 Lknee arthroscopy, 3 R knee arthroscpoy  . KNEE SURGERY Right 11/12/2016  . LUMBAR FUSION  03/26/2020  . LUMBAR  LAMINECTOMY/DECOMPRESSION MICRODISCECTOMY Right 05/22/2019   Procedure: LAMINOTOMY AND MICRODISCECTOMY RIGHT LUMBAR FOUR- LUMBAR FIVE;  Surgeon: Consuella Lose, MD;  Location: [place];  Service: Neurosurgery;  Laterality: Right;  LAMINOTOMY AND MICRODISCECTOMY RIGHT LUMBAR FOUR- LUMBAR FIVE  . LUMBAR LAMINECTOMY/DECOMPRESSION MICRODISCECTOMY Right 02/01/2020   Procedure: REDO MICRODISCECTOMY RT LUMBAR FOUR-FIVE;  Surgeon: Consuella Lose, MD;  Location: [place];  Service: Neurosurgery;  Laterality: Right;  posterior  . TONSILLECTOMY      There were no vitals filed for this visit.   Subjective Assessment - 06/13/20 1015    Subjective "I've learned how to moderate activities."  Pt reports he returns from vacation to [facility]. He drove himself to therapy today for first time in 6 months. Pt reports his doctor wants him to wean from oxycodone.    Pertinent History laminectomy april 2021 and again 12/21, 2018 cervical decompression    Patient Stated Goals improve strength, be able to get up and down better, community exercise    Pain Score 2     Pain Location Back    Pain Orientation Right;Left;Lower    Pain Descriptors / Indicators Aching    Aggravating Factors  overdoing it    Pain Relieving Factors lumbar support.              [facility] PT Assessment - 06/13/20 0001      Assessment  Medical Diagnosis L4-L5 discectomy/lumbar fusion    Referring Provider (PT) Consuella Lose, MD    Onset Date/Surgical Date 03/26/20    Hand Dominance Right    Next MD Visit 07/17/20    Prior Therapy yes      Functional Tests   Functional tests Sit to Stand      Sit to Stand   Comments 13.46            [facility] Adult PT Treatment/Exercise - 06/13/20 0001      Lumbar Exercises: Stretches   Hip Flexor Stretch Right;Left;2 reps;20 seconds   seated, for quad stretch     Lumbar Exercises: Aerobic   Nustep L6: x 7 min (arms/legs)      Lumbar Exercises: Seated   Sit to Stand 5 reps    Other  Seated Lumbar Exercises side stretch with single arm overhead, x 10 sec x 2 reps each side.           Balance Exercises - 06/13/20 0001      Balance Exercises: Standing   Tandem Stance Eyes open;2 reps;15 secs    SLS Eyes open;Solid surface;2 reps;10 secs    Rockerboard Anterior/posterior;Lateral;EO;30 seconds;Intermittent UE support    Step Ups Forward;UE support 1;Other (comment)   5 reps; onto Bosu   Step Over Hurdles / Cones stepping over noodles and around dynadisc; CGA for safety. X 7 reps    Marching Foam/compliant surface;Intermittent upper extremity assist;Static;10 reps                  PT Long Term Goals - 06/03/20 1159      PT LONG TERM GOAL #1   Title The patient will be independent with HEP.    Status On-going      PT LONG TERM GOAL #2   Title The patient will improve LE strength to 4+/5 to 5-/5 hip flexion, extension and abduction.    Status Partially Met      PT LONG TERM GOAL #3   Title Patient to demonstrate safe independent gait with assistive device as indicated for community distances    Status Partially Met      PT LONG TERM GOAL #4   Title Patient to demonstrate sit to stand and stand to sit transfers independently and safely x 5 reps in 12 sec    Baseline 13.64 sec    Status On-going      PT LONG TERM GOAL #5   Title Functional status measure increased to 23 indicating improved functional activities and ADL's    Status Achieved                 Plan - 06/13/20 1307    Clinical Impression Statement Pt's 5x sit to stand time similar to last attempt.  He fatigued quickly with standing dynamic balance work, stepping over obstacles requiring increased step length.  He had one loss of balance (forward) requiring min A to steady. After this loss of balance, legs were sore and fatigued.  Performed gentle seated stretches at end of session.  Pt has made great gains towards goals.    Comorbidities cervical decompression, CIDP, h/o frequent  falls, prior back surgery 05/2019    Examination-Activity Limitations Lift;Locomotion Level;Stairs;Stand;Sit    PT Frequency 2x / week    PT Duration 6 weeks    PT Treatment/Interventions ADLs/Self Care Home Management;Gait training;Stair training;Functional mobility training;Therapeutic activities;Therapeutic exercise;Balance training;Patient/family education;Manual techniques;Taping;Cryotherapy;Electrical Stimulation;Iontophoresis 102m/ml Dexamethasone;Moist Heat;Ultrasound;Neuromuscular re-education;Dry needling    PT Next Visit Plan  Progress HEP to tolerance focusing on core stabilization; LE strengthening; improving functional mobility; gait training - working with patient to avoid overdoing exercises and activities in clinic. Continue to encourage cane in L UE.  (possibly decrease frequency for upcoming appts?)    PT Home Exercise Plan [place] and Agree with Plan of Care Patient           Patient will benefit from skilled therapeutic intervention in order to improve the following deficits and impairments:  Pain,Impaired flexibility,Hypomobility,Postural dysfunction,Decreased strength,Decreased range of motion,Difficulty walking,Decreased activity tolerance,Decreased balance  Visit Diagnosis: Muscle weakness (generalized)  Other abnormalities of gait and mobility  Acute low back pain with sciatica, sciatica laterality unspecified, unspecified back pain laterality  Unsteadiness on feet     Problem List Patient Active Problem List   Diagnosis Date Noted  . Lumbar herniated disc 02/01/2020  . Lumbar radiculopathy 05/22/2019  . Spastic gait 03/07/2019  . Myalgia 11/01/2018  . Chronic bilateral low back pain without sciatica 07/21/2018  . 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 CIDP (chronic inflammatory demyelinating polyneuropathy) ([place]) 12/30/2017  . Edema 03/01/2017  . Hypokalemia   . Slow transit constipation   . Steroid-induced hyperglycemia   . Urinary  retention   . Accidental drug overdose   . Postoperative pain   . Hyponatremia   . Cervical myelopathy ([place]) 02/01/2017  . Shortness of breath at rest   . Hypoglycemia   . Spondylosis, cervical, with myelopathy   . Neuropathic pain   . Benign essential HTN   . Labile blood glucose   . Muscle spasm   . GAD (generalized anxiety disorder)   . Acute inflammatory demyelinating polyneuropathy ([place]) 01/11/2017  . Anxiety state   . Neurogenic bladder   . Depression 12/30/2016  . Leg weakness, bilateral 12/30/2016  . 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 GBS (Guillain Barre syndrome) ([place])   . AIDP (acute inflammatory demyelinating polyneuropathy) ([place]) 11/27/2016  . Weakness 11/20/2016  . Weakness of both arms 10/30/2016  . 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 Obstructive sleep apnea 03/24/2016  . Numbness 03/18/2016  . Mild nonproliferative diabetic retinopathy of both eyes without macular edema associated with type 2 diabetes mellitus ([place]) 05/24/2015  . Nuclear cataract of both eyes 05/24/2015  . Diabetes mellitus without complication ([place]) 16/11/9602  . 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 Eustachian tube dysfunction 02/12/2015  . Acute maxillary sinusitis 02/12/2015  . Wellness examination 08/22/2014  . Alkaline phosphatase elevation 08/22/2014  . 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 Benign non-nodular prostatic hyperplasia with lower urinary tract symptoms 01/31/2014  . Lesion of eyelid 09/26/2013  . Screening for prostate cancer 04/24/2013  . Diabetes ([place]) 06/16/2012  . ED (erectile dysfunction) of organic origin 06/03/2012  . Routine general medical examination at a health care facility 04/10/2012  . BPH (benign prostatic hyperplasia) 02/03/2010  . HEMOCCULT POSITIVE STOOL 05/25/2008  . ELBOW PAIN, RIGHT 07/04/2007  . Dyslipidemia 02/16/2007  . ANXIETY 02/16/2007  . ERECTILE DYSFUNCTION  02/16/2007  . Essential hypertension 02/16/2007  . TESTICULAR MASS, LEFT 02/16/2007  . KNEE PAIN, CHRONIC 02/16/2007  . ADENOIDECTOMY, HX OF 02/16/2007    Shelbie Hutching 06/13/2020, 1:12 PM  [facility] [place], [place], [address] Phone: [phone]   Fax:  [phone]  Name: VIYAN ROSAMOND MRN: [id number] Date of Birth: [birth year]

## 2020-06-14 LAB — C-PEPTIDE: C-Peptide: 0.16 ng/mL — ABNORMAL LOW (ref 0.80–3.85)

## 2020-06-17 ENCOUNTER — Other Ambulatory Visit: Payer: Self-pay

## 2020-06-17 ENCOUNTER — Other Ambulatory Visit: Payer: Self-pay | Admitting: Endocrinology

## 2020-06-17 ENCOUNTER — Encounter: Payer: Self-pay | Admitting: Physical Therapy

## 2020-06-17 ENCOUNTER — Ambulatory Visit (INDEPENDENT_AMBULATORY_CARE_PROVIDER_SITE_OTHER): Payer: Medicare Other | Admitting: Physical Therapy

## 2020-06-17 DIAGNOSIS — M5440 Lumbago with sciatica, unspecified side: Secondary | ICD-10-CM

## 2020-06-17 DIAGNOSIS — R2681 Unsteadiness on feet: Secondary | ICD-10-CM

## 2020-06-17 DIAGNOSIS — M6281 Muscle weakness (generalized): Secondary | ICD-10-CM | POA: Diagnosis present

## 2020-06-17 DIAGNOSIS — R2689 Other abnormalities of gait and mobility: Secondary | ICD-10-CM | POA: Diagnosis not present

## 2020-06-17 DIAGNOSIS — E119 Type 2 diabetes mellitus without complications: Secondary | ICD-10-CM

## 2020-06-17 NOTE — Patient Instructions (Signed)
Access Code: [facility] URL: [email or web address] Date: 06/17/2020 Prepared by: [place]  Program Notes *Exercise should not be painful.  When you are stretching, you will feel a gentle pull, but it should not hurt in your back.   Exercises Gastroc Stretch on Wall - 1 x daily - 7 x weekly - 1 sets - 3 reps - 30 seconds hold Seated March - 1 x daily - 7 x weekly - 1-2 sets - 10 reps Standing Bilateral Low Shoulder Row with Anchored Resistance - 1 x daily - 7 x weekly - 2 sets - 10 reps Shoulder Extension with Resistance - 1 x daily - 7 x weekly - 2 sets - 10 reps Seated Alternating Side Stretch with Arm Overhead - 1 x daily - 7 x weekly - 1 sets - 2 reps - 20 seconds hold Single Leg Stance - 1 x daily - 7 x weekly - 1 sets - 2 reps - 20 seconds hold Sit to Stand - 1 x daily - 7 x weekly - 1 sets - 10 reps Standing Hip Extension with Counter Support - 1 x daily - 7 x weekly - 1-2 sets - 10 reps

## 2020-06-17 NOTE — Therapy (Signed)
[place], [place], [address] Phone: [phone]   Fax:  [phone]  Physical Therapy Treatment  Patient Details  Name: Kenneth Pace MRN: [id number] Date of Birth: [birth year] Referring Provider (PT): Consuella Lose, MD   Encounter Date: 06/17/2020   PT End of Session - 06/17/20 1107    Visit Number 16    Number of Visits 24    Date for PT Re-Evaluation 06/26/20    Progress Note Due on Visit 19    PT Start Time 1100    PT Stop Time 1139    PT Time Calculation (min) 39 min    Equipment Utilized During Treatment Back brace    Behavior During Therapy [facility] for tasks assessed/performed           Past Medical History:  Diagnosis Date  . ADENOIDECTOMY, HX OF 02/16/2007  . Anxiety    pt. states he does not have anxiety.  Marland Kitchen BENIGN PROSTATIC HYPERTROPHY, WITH OBSTRUCTION 02/03/2010  . 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 DIABETES MELLITUS, TYPE I, UNCONTROLLED 12/29/2006  . ERECTILE DYSFUNCTION 02/16/2007  . Hearing loss    wears hearing aids  . HYPERLIPIDEMIA 02/16/2007  . HYPERTENSION 02/16/2007  . Nerve pain    per patient "on lower back and both legs"  . Sleep apnea    uses CPAP  . TESTICULAR MASS, LEFT 02/16/2007  . Therapeutic opioid-induced constipation (OIC)     Past Surgical History:  Procedure Laterality Date  . ANTERIOR CERVICAL DECOMP/DISCECTOMY FUSION N/A 02/01/2017   Procedure: ANTERIOR CERVICAL DECOMPRESSION/DISCECTOMY FUSION CERVICAL THREE-FOUR , CERVICAL FOUR-FIVE  CERVICAL FIVE-SIX;  Surgeon: Consuella Lose, MD;  Location: [place];  Service: Neurosurgery;  Laterality: N/A;  . APPENDECTOMY    . BACK SURGERY    . COLONOSCOPY  02/05/2010   [facility]  . EYE SURGERY Right    Dr. Katy Fitch  . KNEE SURGERY     2 Lknee arthroscopy, 3 R knee arthroscpoy  . KNEE SURGERY Right 11/12/2016  . LUMBAR FUSION  03/26/2020  . LUMBAR  LAMINECTOMY/DECOMPRESSION MICRODISCECTOMY Right 05/22/2019   Procedure: LAMINOTOMY AND MICRODISCECTOMY RIGHT LUMBAR FOUR- LUMBAR FIVE;  Surgeon: Consuella Lose, MD;  Location: [place];  Service: Neurosurgery;  Laterality: Right;  LAMINOTOMY AND MICRODISCECTOMY RIGHT LUMBAR FOUR- LUMBAR FIVE  . LUMBAR LAMINECTOMY/DECOMPRESSION MICRODISCECTOMY Right 02/01/2020   Procedure: REDO MICRODISCECTOMY RT LUMBAR FOUR-FIVE;  Surgeon: Consuella Lose, MD;  Location: [place];  Service: Neurosurgery;  Laterality: Right;  posterior  . TONSILLECTOMY      There were no vitals filed for this visit.   Subjective Assessment - 06/17/20 1108    Subjective Pt reports he tweaked his Lt knee while doing yard work.  Otherwise, things are going well, making only small incremental progress now.    Currently in Pain? Yes    Pain Score 2     Pain Location Back    Pain Orientation Right;Left;Lower    Pain Descriptors / Indicators Aching    Aggravating Factors  overdoing it    Pain Relieving Factors lumbar support.              [facility] PT Assessment - 06/17/20 0001      Assessment   Medical Diagnosis L4-L5 discectomy/lumbar fusion    Referring Provider (PT) Consuella Lose, MD    Onset Date/Surgical Date 03/26/20    Hand Dominance Right    Next MD Visit 07/17/20    Prior Therapy yes  Strength   Right Hip Flexion 4-/5   performed in seated position   Left Hip Flexion 4-/5           [facility] Adult PT Treatment/Exercise - 06/17/20 0001      Lumbar Exercises: Stretches   Gastroc Stretch Right;Left;2 reps;30 seconds    Other Lumbar Stretch Exercise standing at counter completing side stretch, with arm sliding up cabinet x 5-10 sec x 2 reps each right / left.      Lumbar Exercises: Aerobic   Nustep L6: x 7 min (arms/legs)      Lumbar Exercises: Standing   Row Both;10 reps;Theraband   core engaged   Theraband Level (Row) Level 4 (Blue)    Shoulder Extension Both;10 reps   core engaged.   Theraband  Level (Shoulder Extension) Level 4 (Blue)      Lumbar Exercises: Seated   Sit to Stand 5 reps   slow and controlled   Other Seated Lumbar Exercises seated hip flexion x 5 reps, then 10 with opp arm raise      Knee/Hip Exercises: Standing   Hip Extension Stengthening;Right;Left;1 set;10 reps    Other Standing Knee Exercises backward walking x 10 ft with intermittent support           Balance Exercises - 06/17/20 0001      Balance Exercises: Standing   SLS Eyes open;Solid surface;2 reps;15 secs    Rockerboard Anterior/posterior;Lateral;EO;30 seconds;Intermittent UE support   CGA for safety                 PT Long Term Goals - 06/03/20 1159      PT LONG TERM GOAL #1   Title The patient will be independent with HEP.    Status On-going      PT LONG TERM GOAL #2   Title The patient will improve LE strength to 4+/5 to 5-/5 hip flexion, extension and abduction.    Status Partially Met      PT LONG TERM GOAL #3   Title Patient to demonstrate safe independent gait with assistive device as indicated for community distances    Status Partially Met      PT LONG TERM GOAL #4   Title Patient to demonstrate sit to stand and stand to sit transfers independently and safely x 5 reps in 12 sec    Baseline 13.64 sec    Status On-going      PT LONG TERM GOAL #5   Title Functional status measure increased to 23 indicating improved functional activities and ADL's    Status Achieved                 Plan - 06/17/20 1147    Clinical Impression Statement Hip musculature gradually improving.  Pt continues to utilize assistance from UE to lift LE when getting on/off NuStep.  Pt prefers exercises in standing/seated and not in supine.  Modified HEP to accommodate this request.  Pt making gradual gains towards remaining goals.    Comorbidities cervical decompression, CIDP, h/o frequent falls, prior back surgery 05/2019    Examination-Activity Limitations Lift;Locomotion  Level;Stairs;Stand;Sit    Rehab Potential Good    PT Frequency 2x / week    PT Duration 6 weeks    PT Treatment/Interventions ADLs/Self Care Home Management;Gait training;Stair training;Functional mobility training;Therapeutic activities;Therapeutic exercise;Balance training;Patient/family education;Manual techniques;Taping;Cryotherapy;Electrical Stimulation;Iontophoresis 36m/ml Dexamethasone;Moist Heat;Ultrasound;Neuromuscular re-education;Dry needling    PT Next Visit Plan Progress HEP to tolerance focusing on core stabilization; LE strengthening.  End of POC  assess goals for renewal/ MD note.    PT Home Exercise Plan [place] and Agree with Plan of Care Patient           Patient will benefit from skilled therapeutic intervention in order to improve the following deficits and impairments:  Pain,Impaired flexibility,Hypomobility,Postural dysfunction,Decreased strength,Decreased range of motion,Difficulty walking,Decreased activity tolerance,Decreased balance  Visit Diagnosis: Muscle weakness (generalized)  Other abnormalities of gait and mobility  Acute low back pain with sciatica, sciatica laterality unspecified, unspecified back pain laterality  Unsteadiness on feet     Problem List Patient Active Problem List   Diagnosis Date Noted  . Lumbar herniated disc 02/01/2020  . Lumbar radiculopathy 05/22/2019  . Spastic gait 03/07/2019  . Myalgia 11/01/2018  . Chronic bilateral low back pain without sciatica 07/21/2018  . 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 CIDP (chronic inflammatory demyelinating polyneuropathy) ([place]) 12/30/2017  . Edema 03/01/2017  . Hypokalemia   . Slow transit constipation   . Steroid-induced hyperglycemia   . Urinary retention   . Accidental drug overdose   . Postoperative pain   . Hyponatremia   . Cervical myelopathy ([place]) 02/01/2017  . Shortness of breath at rest   . Hypoglycemia   . Spondylosis, cervical, with myelopathy   .  Neuropathic pain   . Benign essential HTN   . Labile blood glucose   . Muscle spasm   . GAD (generalized anxiety disorder)   . Acute inflammatory demyelinating polyneuropathy ([place]) 01/11/2017  . Anxiety state   . Neurogenic bladder   . Depression 12/30/2016  . Leg weakness, bilateral 12/30/2016  . 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 GBS (Guillain Barre syndrome) ([place])   . AIDP (acute inflammatory demyelinating polyneuropathy) ([place]) 11/27/2016  . Weakness 11/20/2016  . Weakness of both arms 10/30/2016  . 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 Obstructive sleep apnea 03/24/2016  . Numbness 03/18/2016  . Mild nonproliferative diabetic retinopathy of both eyes without macular edema associated with type 2 diabetes mellitus ([place]) 05/24/2015  . Nuclear cataract of both eyes 05/24/2015  . Diabetes mellitus without complication ([place]) 86/75/4492  . 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 Eustachian tube dysfunction 02/12/2015  . Acute maxillary sinusitis 02/12/2015  . Wellness examination 08/22/2014  . Alkaline phosphatase elevation 08/22/2014  . 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 Benign non-nodular prostatic hyperplasia with lower urinary tract symptoms 01/31/2014  . Lesion of eyelid 09/26/2013  . Screening for prostate cancer 04/24/2013  . Diabetes ([place]) 06/16/2012  . ED (erectile dysfunction) of organic origin 06/03/2012  . Routine general medical examination at a health care facility 04/10/2012  . BPH (benign prostatic hyperplasia) 02/03/2010  . HEMOCCULT POSITIVE STOOL 05/25/2008  . ELBOW PAIN, RIGHT 07/04/2007  . Dyslipidemia 02/16/2007  . ANXIETY 02/16/2007  . ERECTILE DYSFUNCTION 02/16/2007  . Essential hypertension 02/16/2007  . TESTICULAR MASS, LEFT 02/16/2007  . KNEE PAIN, CHRONIC 02/16/2007  . ADENOIDECTOMY, HX OF 02/16/2007   Kerin Perna, PTA 06/17/20 12:00 PM  [place], [place], [address] Phone: [phone]   Fax:  [phone]  Name: Kenneth Pace MRN: [id number] Date of Birth: 1950-06-22

## 2020-06-17 NOTE — Progress Notes (Signed)
They will not accept this.  FBS is too high,  will high.  He will need another one.  Please put in another order

## 2020-06-20 ENCOUNTER — Encounter: Payer: Medicare Other | Admitting: Physical Therapy

## 2020-06-24 ENCOUNTER — Ambulatory Visit (INDEPENDENT_AMBULATORY_CARE_PROVIDER_SITE_OTHER): Payer: Medicare Other | Admitting: Rehabilitative and Restorative Service Providers"

## 2020-06-24 ENCOUNTER — Other Ambulatory Visit: Payer: Self-pay

## 2020-06-24 ENCOUNTER — Encounter: Payer: Self-pay | Admitting: Rehabilitative and Restorative Service Providers"

## 2020-06-24 DIAGNOSIS — M5440 Lumbago with sciatica, unspecified side: Secondary | ICD-10-CM | POA: Diagnosis not present

## 2020-06-24 DIAGNOSIS — M6281 Muscle weakness (generalized): Secondary | ICD-10-CM

## 2020-06-24 DIAGNOSIS — R2689 Other abnormalities of gait and mobility: Secondary | ICD-10-CM | POA: Diagnosis not present

## 2020-06-24 DIAGNOSIS — R2681 Unsteadiness on feet: Secondary | ICD-10-CM

## 2020-06-24 NOTE — Therapy (Signed)
[place], [place], [address] Phone: [phone]   Fax:  [phone]  Physical Therapy Treatment  Patient Details  Name: Kenneth Pace MRN: [id number] Date of Birth: 1950-10-19 Referring Provider (PT): Consuella Lose, MD   Encounter Date: 06/24/2020   PT End of Session - 06/24/20 1103    Visit Number 17    Number of Visits 24    Date for PT Re-Evaluation 08/05/20    Progress Note Due on Visit 43    PT Start Time 1100    PT Stop Time 1145    PT Time Calculation (min) 45 min    Equipment Utilized During Treatment Back brace    Activity Tolerance Patient tolerated treatment well           Past Medical History:  Diagnosis Date  . ADENOIDECTOMY, HX OF 02/16/2007  . Anxiety    pt. states he does not have anxiety.  Marland Kitchen BENIGN PROSTATIC HYPERTROPHY, WITH OBSTRUCTION 02/03/2010  . 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 DIABETES MELLITUS, TYPE I, UNCONTROLLED 12/29/2006  . ERECTILE DYSFUNCTION 02/16/2007  . Hearing loss    wears hearing aids  . HYPERLIPIDEMIA 02/16/2007  . HYPERTENSION 02/16/2007  . Nerve pain    per patient "on lower back and both legs"  . Sleep apnea    uses CPAP  . TESTICULAR MASS, LEFT 02/16/2007  . Therapeutic opioid-induced constipation (OIC)     Past Surgical History:  Procedure Laterality Date  . ANTERIOR CERVICAL DECOMP/DISCECTOMY FUSION N/A 02/01/2017   Procedure: ANTERIOR CERVICAL DECOMPRESSION/DISCECTOMY FUSION CERVICAL THREE-FOUR , CERVICAL FOUR-FIVE  CERVICAL FIVE-SIX;  Surgeon: Consuella Lose, MD;  Location: [facility];  Service: Neurosurgery;  Laterality: N/A;  . APPENDECTOMY    . BACK SURGERY    . COLONOSCOPY  02/05/2010   [facility]  . EYE SURGERY Right    Dr. Katy Fitch  . KNEE SURGERY     2 Lknee arthroscopy, 3 R knee arthroscpoy  . KNEE SURGERY Right 11/12/2016  . LUMBAR FUSION  03/26/2020  . LUMBAR  LAMINECTOMY/DECOMPRESSION MICRODISCECTOMY Right 05/22/2019   Procedure: LAMINOTOMY AND MICRODISCECTOMY RIGHT LUMBAR FOUR- LUMBAR FIVE;  Surgeon: Consuella Lose, MD;  Location: [facility];  Service: Neurosurgery;  Laterality: Right;  LAMINOTOMY AND MICRODISCECTOMY RIGHT LUMBAR FOUR- LUMBAR FIVE  . LUMBAR LAMINECTOMY/DECOMPRESSION MICRODISCECTOMY Right 02/01/2020   Procedure: REDO MICRODISCECTOMY RT LUMBAR FOUR-FIVE;  Surgeon: Consuella Lose, MD;  Location: [facility];  Service: Neurosurgery;  Laterality: Right;  posterior  . TONSILLECTOMY      There were no vitals filed for this visit.   Subjective Assessment - 06/24/20 1107    Subjective Patient reports that he is continued to improve. He has an infusion this week and sees the orthopedist Wednesday for an injection in the Lt knee. He is independent in ADL's and working in the yard some.    Currently in Pain? Yes    Pain Score 2     Pain Location Back    Pain Type Chronic pain;Surgical pain;Acute pain    Pain Onset More than a month ago    Pain Frequency Constant    Aggravating Factors  overdoing activities    Pain Relieving Factors lumbar support; rest              [facility] PT Assessment - 06/24/20 0001      Assessment   Medical Diagnosis L4-L5 discectomy/lumbar fusion    Referring Provider (PT) Consuella Lose, MD  Onset Date/Surgical Date 03/26/20    Hand Dominance Right    Next MD Visit 07/17/20    Prior Therapy yes      Balance Screen   Has the patient fallen in the past 6 months --   no falls in last 2 months     Prior Function   Level of Independence Independent with basic ADLs    Vocation Retired      Observation/Other Assessments   Focus on Therapeutic Outcomes (FOTO)  Functional status: 59      Strength   Right Hip Flexion 4+/5   supine   Right Hip Extension 4/5   prone   Right Hip ABduction --   5-/5   Left Hip Flexion 4+/5   supine   Left Hip Extension 4+/5   prone   Left Hip ABduction --   5-/5   Right Knee  Flexion 5/5    Right Knee Extension 5/5    Left Knee Flexion 5/5    Left Knee Extension 5/5    Right Ankle Dorsiflexion 5/5    Left Ankle Dorsiflexion 5/5      Transfers   Five time sit to stand comments  standard chair, no UE:  11.5 sec      Ambulation/Gait   Gait Comments without cane - intermittent decrease knee hip/knee flex in swing; occassional scissoring and LOB      Balance   Balance Assessed --   SLS 1-2 sec unsupported several seconds with min UE support                        [facility] Adult PT Treatment/Exercise - 06/24/20 0001      Ambulation/Gait   Pre-Gait Activities standing wt shift and balance activities      Lumbar Exercises: Stretches   Gastroc Stretch Right;Left;2 reps;30 seconds      Lumbar Exercises: Aerobic   Nustep L6: x 8.5 min (arms/legs)      Lumbar Exercises: Seated   Sit to Stand 5 reps                       PT Long Term Goals - 06/24/20 1111      PT LONG TERM GOAL #1   Title The patient will be independent with HEP including 1-2 days a week at the [facility] for water exercise and lnd based exercises    Time 6    Period Weeks    Status On-going    Target Date 08/05/20      PT LONG TERM GOAL #2   Title The patient will improve LE strength to 4+/5 to 5-/5 hip flexion, extension and abduction.    Time 6    Period Weeks    Status On-going    Target Date 08/05/20      PT LONG TERM GOAL #3   Title Patient to demonstrate safe independent gait with assistive device as indicated for community distances    Time 6    Period Weeks    Status On-going    Target Date 08/05/20      PT LONG TERM GOAL #4   Title Patient to demonstrate sit to stand and stand to sit transfers independently and safely x 5 reps in 12 sec    Baseline 11.5    Time 6    Status Achieved    Target Date 06/26/20      PT LONG TERM GOAL #5   Title  Functional status measure increased to 23 indicating improved functional activities and ADL's    Time 6     Period Weeks    Status Achieved                 Plan - 06/24/20 1138    Clinical Impression Statement Patient reports continued improvement with less pain and incresaed strength and function. Patient demonstrates increased LE strength; improved 5x sit to stand time; improved gait pattern and independence in gait with and without assistive device. Patient is independent in transfers and ADL's. He will continue to benefit from PT to address remaining deficits and transition to independent and community based exercise program. Goals are partially accomplished.    Rehab Potential Good    PT Frequency 1x / week    PT Duration 6 weeks    PT Treatment/Interventions ADLs/Self Care Home Management;Gait training;Stair training;Functional mobility training;Therapeutic activities;Therapeutic exercise;Balance training;Patient/family education;Manual techniques;Taping;Cryotherapy;Electrical Stimulation;Iontophoresis 4mg /ml Dexamethasone;Moist Heat;Ultrasound;Neuromuscular re-education;Dry needling    PT Next Visit Plan Progress HEP to tolerance focusing on core stabilization; LE strengthening; gait training; balance activities; functional strengthening    PT [place] and Agree with Plan of Care Patient           Patient will benefit from skilled therapeutic intervention in order to improve the following deficits and impairments:     Visit Diagnosis: Muscle weakness (generalized) - Plan: PT plan of care cert/re-cert  Other abnormalities of gait and mobility - Plan: PT plan of care cert/re-cert  Acute low back pain with sciatica, sciatica laterality unspecified, unspecified back pain laterality - Plan: PT plan of care cert/re-cert  Unsteadiness on feet - Plan: PT plan of care cert/re-cert     Problem List Patient Active Problem List   Diagnosis Date Noted  . Lumbar herniated disc 02/01/2020  . Lumbar radiculopathy 05/22/2019  . Spastic gait 03/07/2019   . Myalgia 11/01/2018  . Chronic bilateral low back pain without sciatica 07/21/2018  . 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 CIDP (chronic inflammatory demyelinating polyneuropathy) ([place]) 12/30/2017  . Edema 03/01/2017  . Hypokalemia   . Slow transit constipation   . Steroid-induced hyperglycemia   . Urinary retention   . Accidental drug overdose   . Postoperative pain   . Hyponatremia   . Cervical myelopathy ([place]) 02/01/2017  . Shortness of breath at rest   . Hypoglycemia   . Spondylosis, cervical, with myelopathy   . Neuropathic pain   . Benign essential HTN   . Labile blood glucose   . Muscle spasm   . GAD (generalized anxiety disorder)   . Acute inflammatory demyelinating polyneuropathy ([place]) 01/11/2017  . Anxiety state   . Neurogenic bladder   . Depression 12/30/2016  . Leg weakness, bilateral 12/30/2016  . 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 GBS (Guillain Barre syndrome) ([place])   . AIDP (acute inflammatory demyelinating polyneuropathy) ([place]) 11/27/2016  . Weakness 11/20/2016  . Weakness of both arms 10/30/2016  . 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 Obstructive sleep apnea 03/24/2016  . Numbness 03/18/2016  . Mild nonproliferative diabetic retinopathy of both eyes without macular edema associated with type 2 diabetes mellitus ([place]) 05/24/2015  . Nuclear cataract of both eyes 05/24/2015  . Diabetes mellitus without complication ([place]) [id number]  . 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 Eustachian tube dysfunction 02/12/2015  . Acute maxillary sinusitis 02/12/2015  . Wellness examination 08/22/2014  . Alkaline phosphatase elevation 08/22/2014  . 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 Benign non-nodular prostatic hyperplasia with lower urinary tract symptoms 01/31/2014  . Lesion of eyelid 09/26/2013  . Screening for prostate cancer 04/24/2013  . Diabetes ([place]) 06/16/2012  . ED (erectile  dysfunction) of organic origin 06/03/2012  . Routine general medical examination at a health care facility 04/10/2012  . BPH (benign prostatic hyperplasia) 02/03/2010  . HEMOCCULT POSITIVE STOOL 05/25/2008  . ELBOW PAIN, RIGHT 07/04/2007  . Dyslipidemia 02/16/2007  . ANXIETY 02/16/2007  . ERECTILE DYSFUNCTION 02/16/2007  . Essential hypertension 02/16/2007  . TESTICULAR MASS, LEFT 02/16/2007  . KNEE PAIN, CHRONIC 02/16/2007  . Michell Heinrich OF 02/16/2007    Zianne Schubring Nilda Simmer PT, MPH  06/24/2020, 11:47 AM  [facility] [place], [place], [address] Phone: [phone]   Fax:  [phone]  Name: Kenneth Pace MRN: [id number] Date of Birth: March 12, 1950

## 2020-06-26 ENCOUNTER — Other Ambulatory Visit: Payer: Self-pay

## 2020-06-26 ENCOUNTER — Other Ambulatory Visit: Payer: Medicare Other

## 2020-06-26 DIAGNOSIS — E119 Type 2 diabetes mellitus without complications: Secondary | ICD-10-CM

## 2020-06-27 LAB — GLUCOSE, FASTING: Glucose, Bld: 91 mg/dL (ref 65–99)

## 2020-06-27 LAB — C-PEPTIDE: C-Peptide: <0.1 ng/mL — ABNORMAL LOW (ref 0.80–3.85)

## 2020-06-28 ENCOUNTER — Ambulatory Visit (HOSPITAL_COMMUNITY)
Admission: RE | Admit: 2020-06-28 | Discharge: 2020-06-28 | Disposition: A | Discharge status: Home / Self Care | Payer: Medicare Other | Source: Ambulatory Visit | Attending: Neurology | Admitting: Neurology

## 2020-06-28 ENCOUNTER — Other Ambulatory Visit: Payer: Self-pay

## 2020-06-28 DIAGNOSIS — G6181 Chronic inflammatory demyelinating polyneuritis: Secondary | ICD-10-CM | POA: Diagnosis present

## 2020-06-28 MED ORDER — IMMUNE GLOBULIN (HUMAN) 10 GM/100ML IV SOLN
1.0000 g/kg | INTRAVENOUS | Status: DC
  Administered 2020-06-28: 105 g via INTRAVENOUS
  Filled 2020-06-28: qty 1000

## 2020-07-01 ENCOUNTER — Other Ambulatory Visit: Payer: Self-pay

## 2020-07-01 ENCOUNTER — Ambulatory Visit (INDEPENDENT_AMBULATORY_CARE_PROVIDER_SITE_OTHER): Payer: Medicare Other | Admitting: Physical Therapy

## 2020-07-01 ENCOUNTER — Encounter: Payer: Self-pay | Admitting: Physical Therapy

## 2020-07-01 DIAGNOSIS — M5440 Lumbago with sciatica, unspecified side: Secondary | ICD-10-CM | POA: Diagnosis not present

## 2020-07-01 DIAGNOSIS — R2689 Other abnormalities of gait and mobility: Secondary | ICD-10-CM | POA: Diagnosis not present

## 2020-07-01 DIAGNOSIS — R2681 Unsteadiness on feet: Secondary | ICD-10-CM

## 2020-07-01 DIAGNOSIS — M6281 Muscle weakness (generalized): Secondary | ICD-10-CM

## 2020-07-01 NOTE — Progress Notes (Signed)
Have have made a copy and chart notes and sent to [employer] rep. All is done.

## 2020-07-01 NOTE — Patient Instructions (Signed)
Access Code: [facility] URL: [email or web address] Date: 07/01/2020 Prepared by: Mab[place]  Program Notes *Exercise should not be painful.  When you are stretching, you will feel a gentle pull, but it should not hurt in your back.   Exercises Gastroc Stretch on Wall - 1 x daily - 7 x weekly - 1 sets - 3 reps - 30 seconds hold Seated March - 1 x daily - 7 x weekly - 1-2 sets - 10 reps Standing Bilateral Low Shoulder Row with Anchored Resistance - 1 x daily - 7 x weekly - 2 sets - 10 reps Shoulder Extension with Resistance - 1 x daily - 7 x weekly - 2 sets - 10 reps Seated Alternating Side Stretch with Arm Overhead - 1 x daily - 7 x weekly - 1 sets - 2 reps - 20 seconds hold Single Leg Stance - 1 x daily - 7 x weekly - 1 sets - 2 reps - 20 seconds hold Sit to Stand - 1 x daily - 7 x weekly - 1 sets - 10 reps Standing Hip Extension with Counter Support - 1 x daily - 7 x weekly - 1-2 sets - 10 reps Plank on Counter - 1 x daily - 7 x weekly - 1 sets - 2 reps - 30 sec. hold

## 2020-07-01 NOTE — Therapy (Signed)
[place], [place], [address] Phone: [phone]   Fax:  [phone]  Physical Therapy Treatment  Patient Details  Name: Kenneth Pace MRN: [id number] Date of Birth: [birth year] Referring Provider (PT): Consuella Lose, MD   Encounter Date: 07/01/2020   PT End of Session - 07/01/20 1105    Visit Number 18    Number of Visits 24    Date for PT Re-Evaluation 08/05/20    Progress Note Due on Visit 1    PT Start Time 1058    PT Stop Time 1140    PT Time Calculation (min) 42 min    Equipment Utilized During Treatment Back brace    Activity Tolerance Patient tolerated treatment well           Past Medical History:  Diagnosis Date  . ADENOIDECTOMY, HX OF 02/16/2007  . Anxiety    pt. states he does not have anxiety.  Marland Kitchen BENIGN PROSTATIC HYPERTROPHY, WITH OBSTRUCTION 02/03/2010  . 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 DIABETES MELLITUS, TYPE I, UNCONTROLLED 12/29/2006  . ERECTILE DYSFUNCTION 02/16/2007  . Hearing loss    wears hearing aids  . HYPERLIPIDEMIA 02/16/2007  . HYPERTENSION 02/16/2007  . Nerve pain    per patient "on lower back and both legs"  . Sleep apnea    uses CPAP  . TESTICULAR MASS, LEFT 02/16/2007  . Therapeutic opioid-induced constipation (OIC)     Past Surgical History:  Procedure Laterality Date  . ANTERIOR CERVICAL DECOMP/DISCECTOMY FUSION N/A 02/01/2017   Procedure: ANTERIOR CERVICAL DECOMPRESSION/DISCECTOMY FUSION CERVICAL THREE-FOUR , CERVICAL FOUR-FIVE  CERVICAL FIVE-SIX;  Surgeon: Consuella Lose, MD;  Location: [facility];  Service: Neurosurgery;  Laterality: N/A;  . APPENDECTOMY    . BACK SURGERY    . COLONOSCOPY  02/05/2010   [facility]  . EYE SURGERY Right    Dr. Katy Fitch  . KNEE SURGERY     2 Lknee arthroscopy, 3 R knee arthroscpoy  . KNEE SURGERY Right 11/12/2016  . LUMBAR FUSION  03/26/2020  . LUMBAR  LAMINECTOMY/DECOMPRESSION MICRODISCECTOMY Right 05/22/2019   Procedure: LAMINOTOMY AND MICRODISCECTOMY RIGHT LUMBAR FOUR- LUMBAR FIVE;  Surgeon: Consuella Lose, MD;  Location: [facility];  Service: Neurosurgery;  Laterality: Right;  LAMINOTOMY AND MICRODISCECTOMY RIGHT LUMBAR FOUR- LUMBAR FIVE  . LUMBAR LAMINECTOMY/DECOMPRESSION MICRODISCECTOMY Right 02/01/2020   Procedure: REDO MICRODISCECTOMY RT LUMBAR FOUR-FIVE;  Surgeon: Consuella Lose, MD;  Location: [facility];  Service: Neurosurgery;  Laterality: Right;  posterior  . TONSILLECTOMY      There were no vitals filed for this visit.   Subjective Assessment - 07/01/20 1105    Subjective Pt reports he had infusion on Friday, did gardening Saturday. States he still needs work on leg strength and core.    Pertinent History laminectomy april 2021 and again 12/21, 2018 cervical decompression    Patient Stated Goals improve strength, be able to get up and down better, community exercise    Currently in Pain? Yes    Pain Score 2     Pain Location Back    Pain Orientation Left;Right;Lower    Pain Descriptors / Indicators Aching    Aggravating Factors  overdoing it.    Pain Relieving Factors rest, medication              [facility] PT Assessment - 07/01/20 0001      Assessment   Medical Diagnosis L4-L5 discectomy/lumbar fusion    Referring Provider (PT) Kathyrn Sheriff,  Nena Polio, MD    Onset Date/Surgical Date 03/26/20    Hand Dominance Right    Next MD Visit 07/17/20    Prior Therapy yes             [facility] Adult PT Treatment/Exercise - 07/01/20 0001      Lumbar Exercises: Stretches   Passive Hamstring Stretch Right;Left;1 rep;30 seconds    Hip Flexor Stretch Right;Left;1 rep;60 seconds   seated with leg off edge of chair.   Other Lumbar Stretch Exercise seated, side stretches with/without arms overhead x 2 reps,  seated lumbar rotation x 2 reps, 5 sec each      Lumbar Exercises: Aerobic   Nustep L5: 8 min(arms/legs) with therapist present to  discuss progress.      Lumbar Exercises: Standing   Functional Squats Limitations sit to stand with buttocks to bosu on low grey mat x 6 reps, then holding 5# KB at waist.    Shoulder Extension Both;10 reps;Theraband   3 sec pause, TA engaged.   Theraband Level (Shoulder Extension) Level 3 (Green)    Other Standing Lumbar Exercises trial of side stepping with XTS orange cord - with loss of balance when returning to neutral requiring min A to steady.    Other Standing Lumbar Exercises opp arm/leg      Knee/Hip Exercises: Standing   Forward Step Up Right;Left;1 set;5 reps;Hand Hold: 2;Step Height: 6"    SLS Rt/Lt SLS with intermittent UE support x 3 reps of 10 sec each    Other Standing Knee Exercises counter plank x 2 reps of 20 sec.  Opp arm/leg lift with hands resting on cabinets x 5 reps x 2 sets                  PT Education - 07/01/20 1146    Education Details added counter plank.    Person(s) Educated Patient    Methods Explanation;Handout;Verbal cues;Demonstration    Comprehension Verbalized understanding;Returned demonstration               PT Long Term Goals - 06/24/20 1111      PT LONG TERM GOAL #1   Title The patient will be independent with HEP including 1-2 days a week at the [facility] for water exercise and lnd based exercises    Time 6    Period Weeks    Status On-going    Target Date 08/05/20      PT LONG TERM GOAL #2   Title The patient will improve LE strength to 4+/5 to 5-/5 hip flexion, extension and abduction.    Time 6    Period Weeks    Status On-going    Target Date 08/05/20      PT LONG TERM GOAL #3   Title Patient to demonstrate safe independent gait with assistive device as indicated for community distances    Time 6    Period Weeks    Status On-going    Target Date 08/05/20      PT LONG TERM GOAL #4   Title Patient to demonstrate sit to stand and stand to sit transfers independently and safely x 5 reps in 12 sec    Baseline 11.5     Time 6    Status Achieved    Target Date 06/26/20      PT LONG TERM GOAL #5   Title Functional status measure increased to 23 indicating improved functional activities and ADL's    Time 6    Period  Weeks    Status Achieved                 Plan - 07/01/20 1126    Clinical Impression Statement LEs fatigued quickly with squats and stairs making gait unsteady in between exercises.  Switched over to seated exercises to allow LE to rest. Pt tolerated counter plank and standing opp arm and leg exercises well - added to HEP.  Pt making progress towards remaining goals.    Comorbidities cervical decompression, CIDP, h/o frequent falls, prior back surgery 05/2019    Rehab Potential Good    PT Frequency 1x / week    PT Duration 6 weeks    PT Treatment/Interventions ADLs/Self Care Home Management;Gait training;Stair training;Functional mobility training;Therapeutic activities;Therapeutic exercise;Balance training;Patient/family education;Manual techniques;Taping;Cryotherapy;Electrical Stimulation;Iontophoresis 4mg /ml Dexamethasone;Moist Heat;Ultrasound;Neuromuscular re-education;Dry needling    PT Next Visit Plan Progress HEP to tolerance focusing on core stabilization; LE strengthening; gait training; balance activities; functional strengthening    PT [place] and Agree with Plan of Care Patient           Patient will benefit from skilled therapeutic intervention in order to improve the following deficits and impairments:  Pain,Impaired flexibility,Hypomobility,Postural dysfunction,Decreased strength,Decreased range of motion,Difficulty walking,Decreased activity tolerance,Decreased balance  Visit Diagnosis: Muscle weakness (generalized)  Other abnormalities of gait and mobility  Acute low back pain with sciatica, sciatica laterality unspecified, unspecified back pain laterality  Unsteadiness on feet     Problem List Patient Active Problem List    Diagnosis Date Noted  . Lumbar herniated disc 02/01/2020  . Lumbar radiculopathy 05/22/2019  . Spastic gait 03/07/2019  . Myalgia 11/01/2018  . Chronic bilateral low back pain without sciatica 07/21/2018  . 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 CIDP (chronic inflammatory demyelinating polyneuropathy) ([place]) 12/30/2017  . Edema 03/01/2017  . Hypokalemia   . Slow transit constipation   . Steroid-induced hyperglycemia   . Urinary retention   . Accidental drug overdose   . Postoperative pain   . Hyponatremia   . Cervical myelopathy ([place]) 02/01/2017  . Shortness of breath at rest   . Hypoglycemia   . Spondylosis, cervical, with myelopathy   . Neuropathic pain   . Benign essential HTN   . Labile blood glucose   . Muscle spasm   . GAD (generalized anxiety disorder)   . Acute inflammatory demyelinating polyneuropathy ([place]) 01/11/2017  . Anxiety state   . Neurogenic bladder   . Depression 12/30/2016  . Leg weakness, bilateral 12/30/2016  . 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 GBS (Guillain Barre syndrome) ([place])   . AIDP (acute inflammatory demyelinating polyneuropathy) ([place]) 11/27/2016  . Weakness 11/20/2016  . Weakness of both arms 10/30/2016  . 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 Obstructive sleep apnea 03/24/2016  . Numbness 03/18/2016  . Mild nonproliferative diabetic retinopathy of both eyes without macular edema associated with type 2 diabetes mellitus ([place]) 05/24/2015  . Nuclear cataract of both eyes 05/24/2015  . Diabetes mellitus without complication ([place]) 09/32/6712  . 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 Eustachian tube dysfunction 02/12/2015  . Acute maxillary sinusitis 02/12/2015  . Wellness examination 08/22/2014  . Alkaline phosphatase elevation 08/22/2014  . 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 Benign non-nodular prostatic hyperplasia with lower urinary tract symptoms 01/31/2014  .  Lesion of eyelid 09/26/2013  . Screening for prostate cancer 04/24/2013  . Diabetes ([place]) 06/16/2012  . ED (erectile dysfunction) of organic origin 06/03/2012  .  Routine general medical examination at a health care facility 04/10/2012  . BPH (benign prostatic hyperplasia) 02/03/2010  . HEMOCCULT POSITIVE STOOL 05/25/2008  . ELBOW PAIN, RIGHT 07/04/2007  . Dyslipidemia 02/16/2007  . ANXIETY 02/16/2007  . ERECTILE DYSFUNCTION 02/16/2007  . Essential hypertension 02/16/2007  . TESTICULAR MASS, LEFT 02/16/2007  . KNEE PAIN, CHRONIC 02/16/2007  . ADENOIDECTOMY, HX OF 02/16/2007    Shelbie Hutching 07/01/2020, 11:56 AM  [facility] [place], [place], [address] Phone: [phone]   Fax:  [phone]  Name: Kenneth Pace MRN: [id number] Date of Birth: January 18, 1951

## 2020-07-02 NOTE — Telephone Encounter (Signed)
Patient's wife is on the phone stating [employer] still has not gotten the information they needed in order for patient to receive supplies. I saw Linda's note in the lab result stating she faxed over the information, but they are still stating they do not have it.  Patients wife states they need a signed prescription, a signature on the notes, they need a glucose result as well. It needs to also state that patient is testing 4x a day, and they need the C-peptide result. Ph# [name] (wife) [phone] - patient's wife asked if Vaughan Basta could please give her a call when she can.

## 2020-07-05 ENCOUNTER — Other Ambulatory Visit: Payer: Self-pay

## 2020-07-05 ENCOUNTER — Encounter: Payer: Self-pay | Admitting: Endocrinology

## 2020-07-05 ENCOUNTER — Telehealth: Payer: Self-pay | Admitting: Endocrinology

## 2020-07-05 ENCOUNTER — Telehealth: Payer: Self-pay

## 2020-07-05 MED ORDER — PARADIGM PUMP RESERVOIR 3ML MISC: 1.0000 | 3 refills | Status: DC

## 2020-07-05 MED ORDER — QUICK-SET INFUSION 43" 9MM MISC
1.0000 | 3 refills | Status: DC
Start: 2020-07-05 — End: 2022-10-20

## 2020-07-05 NOTE — Telephone Encounter (Signed)
I printed  

## 2020-07-05 NOTE — Addendum Note (Signed)
Addended by: Renato Shin on: 07/05/2020 12:25 PM   Modules accepted: Orders

## 2020-07-05 NOTE — Telephone Encounter (Signed)
Spoke with Kenneth Pace and she stated that is BS was 48 this morning. She will be over to the office so that I can get a download of the readings. She stated also that she needs a prescriptions for all is supplies for his pump and signed and a copy of labs. I have printed off labs.

## 2020-07-08 ENCOUNTER — Encounter: Payer: Self-pay | Admitting: Physical Therapy

## 2020-07-08 ENCOUNTER — Ambulatory Visit (INDEPENDENT_AMBULATORY_CARE_PROVIDER_SITE_OTHER): Payer: Medicare Other | Admitting: Physical Therapy

## 2020-07-08 ENCOUNTER — Other Ambulatory Visit: Payer: Self-pay

## 2020-07-08 DIAGNOSIS — M6281 Muscle weakness (generalized): Secondary | ICD-10-CM | POA: Diagnosis not present

## 2020-07-08 DIAGNOSIS — M5440 Lumbago with sciatica, unspecified side: Secondary | ICD-10-CM

## 2020-07-08 DIAGNOSIS — R2681 Unsteadiness on feet: Secondary | ICD-10-CM

## 2020-07-08 DIAGNOSIS — R2689 Other abnormalities of gait and mobility: Secondary | ICD-10-CM

## 2020-07-08 NOTE — Therapy (Addendum)
[name], [place], [address] Phone: [phone]   Fax:  [phone]  Physical Therapy Treatment Progress Note Reporting Period 05/20/20 to 07/08/20  See note below for Objective Data and Assessment of Progress/Goals.       Patient Details  Name: Kenneth Pace MRN: [id number] Date of Birth: 10-08-1950 Referring Provider (PT): Consuella Lose, MD   Encounter Date: 07/08/2020   PT End of Session - 07/08/20 1128     Visit Number 19    Number of Visits 24    Date for PT Re-Evaluation 08/05/20    Progress Note Due on Visit 19    PT Start Time 1046    PT Stop Time 1130    PT Time Calculation (min) 44 min    Equipment Utilized During Treatment Back brace    Activity Tolerance Patient tolerated treatment well    Behavior During Therapy WFL for tasks assessed/performed             Past Medical History:  Diagnosis Date   ADENOIDECTOMY, HX OF 02/16/2007   Anxiety    pt. states he does not have anxiety.   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[place])    diagnosed 11/2016   DIABETES MELLITUS, TYPE I, UNCONTROLLED 12/29/2006   ERECTILE DYSFUNCTION 02/16/2007   Hearing loss    wears hearing aids   HYPERLIPIDEMIA 02/16/2007   HYPERTENSION 02/16/2007   Nerve pain    per patient "on lower back and both legs"   Sleep apnea    uses CPAP   TESTICULAR MASS, LEFT 02/16/2007   Therapeutic opioid-induced constipation (OIC)     Past Surgical History:  Procedure Laterality Date   ANTERIOR CERVICAL DECOMP/DISCECTOMY FUSION N/A 02/01/2017   Procedure: ANTERIOR CERVICAL DECOMPRESSION/DISCECTOMY FUSION CERVICAL THREE-FOUR , CERVICAL FOUR-FIVE  CERVICAL FIVE-SIX;  Surgeon: Consuella Lose, MD;  Location: [facility];  Service: Neurosurgery;  Laterality: N/A;   APPENDECTOMY     BACK SURGERY     COLONOSCOPY  02/05/2010    [facility]   EYE SURGERY Right    Dr. Katy Fitch   KNEE SURGERY     2 Lknee arthroscopy, 3 R knee arthroscpoy   KNEE SURGERY Right 11/12/2016   LUMBAR FUSION  03/26/2020   LUMBAR LAMINECTOMY/DECOMPRESSION MICRODISCECTOMY Right 05/22/2019   Procedure: LAMINOTOMY AND MICRODISCECTOMY RIGHT LUMBAR FOUR- LUMBAR FIVE;  Surgeon: Consuella Lose, MD;  Location: [facility];  Service: Neurosurgery;  Laterality: Right;  LAMINOTOMY AND MICRODISCECTOMY RIGHT LUMBAR FOUR- LUMBAR FIVE   LUMBAR LAMINECTOMY/DECOMPRESSION MICRODISCECTOMY Right 02/01/2020   Procedure: REDO MICRODISCECTOMY RT LUMBAR FOUR-FIVE;  Surgeon: Consuella Lose, MD;  Location: [facility];  Service: Neurosurgery;  Laterality: Right;  posterior   TONSILLECTOMY      There were no vitals filed for this visit.   Subjective Assessment - 07/08/20 1050     Subjective Pt reports he is weaning from his brace some.  Otherwise, his HEP is going well.  He reports he is able to kneel to pick objects up with greater ease. Bridge exercise still spikes his pain, but he still performs it every other day.    Patient Stated Goals improve strength, be able to get up and down better, community exercise    Currently in Pain? Yes    Pain Score 1     Pain Location Back    Pain Orientation Left;Right;Lower    Pain Descriptors / Indicators Aching  [facility] PT Assessment - 07/08/20 0001       Assessment   Medical Diagnosis L4-L5 discectomy/lumbar fusion    Referring Provider (PT) Consuella Lose, MD    Onset Date/Surgical Date 03/26/20    Hand Dominance Right    Next MD Visit 07/17/20    Prior Therapy yes      Strength   Right Hip Extension 4/5    Left Hip Extension 4+/5              [facility] Adult PT Treatment/Exercise - 07/08/20 0001       Lumbar Exercises: Aerobic   Nustep L5: 6 min(arms/legs) with therapist present to discuss progress.      Lumbar Exercises: Standing   Other Standing Lumbar Exercises Rt hip ext x 10, then opp  arm /leg raise x 10 each side.      Lumbar Exercises: Seated   Sit to Stand 5 reps   Rt foot back staggered stance.   Other Seated Lumbar Exercises seated holding 5# KB with forward reach x 8, then upward reaching on diagonals and lower reaching x 8 each.      Lumbar Exercises: Quadruped   Opposite Arm/Leg Raise Limitations 3 reps each side, held 3 sec each.      Knee/Hip Exercises: Standing   SLS Rt/Lt SLS with toe taps of opp leg to front/side/back x 5 reps each , with intermittent UE support to steady.             Balance Exercises - 07/08/20 0001       Balance Exercises: Standing   Stepping Strategy Lateral;Foam/compliant surface;5 reps   intermittent support, with 2 sec of SLS   Rockerboard Anterior/posterior;Lateral;EO;30 seconds;Intermittent UE support   CGA for safety   Tandem Gait Retro;1 rep    Step Over Hurdles / Cones stepping over noodles for wt shift into SLS - by forward and lateral.    Marching Foam/compliant surface;Intermittent upper extremity assist;Static;15 reps                    PT Long Term Goals - 07/08/20 1139       PT LONG TERM GOAL #1   Title The patient will be independent with HEP including 1-2 days a week at the [facility] for water exercise and lnd based exercises    Baseline had been attending 2x/wk, but has dropped off since wife hasn't attended.  Plans to restart soon.    Time 6    Period Weeks    Status Partially Met      PT LONG TERM GOAL #2   Title The patient will improve LE strength to 4+/5 to 5-/5 hip flexion, extension and abduction.    Time 6    Period Weeks    Status Partially Met      PT LONG TERM GOAL #3   Title Patient to demonstrate safe independent gait with assistive device as indicated for community distances    Time 6    Period Weeks    Status Achieved      PT LONG TERM GOAL #4   Title Patient to demonstrate sit to stand and stand to sit transfers independently and safely x 5 reps in 12 sec    Time 6     Status Achieved      PT LONG TERM GOAL #5   Title Functional status measure increased to 23 indicating improved functional activities and ADL's    Time 6    Period  Weeks    Status Achieved                   Plan - 07/08/20 1142     Clinical Impression Statement Much improved balance with standing dynamic balance challenges today.  No overt LOB during exercises.  Pt had mild increase in pain in low back (to 3/10) during exercise; returned to 1/10 with seated rest.  Pt's FOTO functional score improved to 60.  He has partially met his goals and requests to hold therapy until he has follow up with surgeon next week.    Comorbidities cervical decompression, CIDP, h/o frequent falls, prior back surgery 05/2019    Rehab Potential Good    PT Frequency 1x / week    PT Duration 6 weeks    PT Treatment/Interventions ADLs/Self Care Home Management;Gait training;Stair training;Functional mobility training;Therapeutic activities;Therapeutic exercise;Balance training;Patient/family education;Manual techniques;Taping;Cryotherapy;Electrical Stimulation;Iontophoresis 59m/ml Dexamethasone;Moist Heat;Ultrasound;Neuromuscular re-education;Dry needling    PT Next Visit Plan hold therapy until follow up visit with MD.    [facility] Agree with Plan of Care Patient             Patient will benefit from skilled therapeutic intervention in order to improve the following deficits and impairments:  Pain,Impaired flexibility,Hypomobility,Postural dysfunction,Decreased strength,Decreased range of motion,Difficulty walking,Decreased activity tolerance,Decreased balance  Visit Diagnosis: Muscle weakness (generalized)  Other abnormalities of gait and mobility  Acute low back pain with sciatica, sciatica laterality unspecified, unspecified back pain laterality  Unsteadiness on feet     Problem List Patient Active Problem List   Diagnosis Date Noted   Lumbar  herniated disc 02/01/2020   Lumbar radiculopathy 05/22/2019   Spastic gait 03/07/2019   Myalgia 11/01/2018   Chronic bilateral low back pain without sciatica 07/21/2018   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   CIDP (chronic inflammatory demyelinating polyneuropathy) ([facility] 12/30/2017   Edema 03/01/2017   Hypokalemia    Slow transit constipation    Steroid-induced hyperglycemia    Urinary retention    Accidental drug overdose    Postoperative pain    Hyponatremia    Cervical myelopathy ([facility]) 02/01/2017   Shortness of breath at rest    Hypoglycemia    Spondylosis, cervical, with myelopathy    Neuropathic pain    Benign essential HTN    Labile blood glucose    Muscle spasm    GAD (generalized anxiety disorder)    Acute inflammatory demyelinating polyneuropathy ([facility] 01/11/2017   Anxiety state    Neurogenic bladder    Depression 12/30/2016   Leg weakness, bilateral 12/30/2016   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[facility] 12/30/2016   GBS (Guillain Barre syndrome) ([facility]    AIDP (acute inflammatory demyelinating polyneuropathy) ([facility] 11/27/2016   Weakness 11/20/2016   Weakness of both arms 10/30/2016   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   Obstructive sleep apnea 03/24/2016   Numbness 03/18/2016   Mild nonproliferative diabetic retinopathy of both eyes without macular edema associated with type 2 diabetes mellitus ([facility] 05/24/2015   Nuclear cataract of both eyes 05/24/2015   Diabetes mellitus without complication ([facility] 006/30/1601  Chronic prostatitis 03/01/2015   Eustachian tube dysfunction 02/12/2015   Acute maxillary sinusitis 02/12/2015   Wellness examination 08/22/2014   Alkaline phosphatase elevation 08/22/2014   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   Benign non-nodular prostatic hyperplasia with lower urinary tract symptoms 01/31/2014   Lesion of eyelid 09/26/2013   Screening for prostate cancer 04/24/2013  Diabetes ([place]) 06/16/2012   ED (erectile dysfunction) of organic origin 06/03/2012   Routine general medical examination at a health care facility 04/10/2012   BPH (benign prostatic hyperplasia) 02/03/2010   HEMOCCULT POSITIVE STOOL 05/25/2008   ELBOW PAIN, RIGHT 07/04/2007   Dyslipidemia 02/16/2007   ANXIETY 02/16/2007   ERECTILE DYSFUNCTION 02/16/2007   Essential hypertension 02/16/2007   TESTICULAR MASS, LEFT 02/16/2007   KNEE PAIN, CHRONIC 02/16/2007   ADENOIDECTOMY, HX OF 02/16/2007   Kerin Perna, PTA 07/08/20 11:57 AM   Celyn P. Helene Kelp PT, MPH 07/08/20 1:00 PM    [place], [place], [address] Phone: [phone]   Fax:  [phone]  Name: CARSIN RANDAZZO MRN: [id number] Date of Birth: October 11, 1950   PHYSICAL THERAPY DISCHARGE SUMMARY  Visits from Start of Care: 19  Current functional level related to goals / functional outcomes: See progress note for d/c status   Remaining deficits: Some pain and weakness continue   Education / Equipment: HEP   Patient agrees to discharge. Patient goals were partially met. Patient is being discharged due to being pleased with the current functional level.

## 2020-07-09 ENCOUNTER — Telehealth: Payer: Self-pay | Admitting: Endocrinology

## 2020-07-09 NOTE — Telephone Encounter (Signed)
Katharine Look from [employer] is calling on behalf of patient on paper work they are needing transmitter checked off and signuatre. From provider on physicans written order

## 2020-07-10 NOTE — Telephone Encounter (Signed)
faxed

## 2020-07-31 ENCOUNTER — Telehealth: Payer: Self-pay | Admitting: Endocrinology

## 2020-07-31 DIAGNOSIS — E119 Type 2 diabetes mellitus without complications: Secondary | ICD-10-CM

## 2020-07-31 DIAGNOSIS — E1042 Type 1 diabetes mellitus with diabetic polyneuropathy: Secondary | ICD-10-CM

## 2020-07-31 MED ORDER — HUMALOG 100 UNIT/ML ~~LOC~~ SOLN: SUBCUTANEOUS | 3 refills | Status: DC

## 2020-07-31 NOTE — Telephone Encounter (Signed)
Wife repeated as below to me.  Telling me that he is using 92u/day, his diagnosis code is E10.42  Please call into [facility] in [place]. With note that he is on an insulin pump.  Thank you

## 2020-07-31 NOTE — Telephone Encounter (Signed)
Rx sent to preferred pharmacy.

## 2020-07-31 NOTE — Telephone Encounter (Signed)
Patients wife called to advise that [facility] pharmacies will cover insulin at $0.00 cost under Medicare Part B. Please send an RX for Humalog to the [facility] on [address] in [place].  Per the Pharm-D at this [facility] for Medicare Part B to cover insulin the RX must include the following pieces of information:  #1 - must indicate that insulin is for use in an insulin pump #2 - must have a DX code on the RX #3 - must show the amount of insulin used per day

## 2020-08-06 ENCOUNTER — Ambulatory Visit: Payer: Medicare Other | Admitting: Physical Medicine & Rehabilitation

## 2020-08-07 ENCOUNTER — Encounter: Payer: Medicare Other | Attending: Physical Medicine & Rehabilitation | Admitting: Registered Nurse

## 2020-08-07 ENCOUNTER — Other Ambulatory Visit: Payer: Self-pay

## 2020-08-07 VITALS — BP 157/76 | HR 79 | Temp 98.2°F | Ht 72.0 in | Wt 228.6 lb

## 2020-08-07 DIAGNOSIS — M545 Low back pain, unspecified: Secondary | ICD-10-CM | POA: Diagnosis present

## 2020-08-07 DIAGNOSIS — G6181 Chronic inflammatory demyelinating polyneuritis: Secondary | ICD-10-CM | POA: Diagnosis not present

## 2020-08-07 DIAGNOSIS — Z5181 Encounter for therapeutic drug level monitoring: Secondary | ICD-10-CM | POA: Diagnosis present

## 2020-08-07 DIAGNOSIS — M25561 Pain in right knee: Secondary | ICD-10-CM | POA: Diagnosis present

## 2020-08-07 DIAGNOSIS — G894 Chronic pain syndrome: Secondary | ICD-10-CM | POA: Insufficient documentation

## 2020-08-07 DIAGNOSIS — M25562 Pain in left knee: Secondary | ICD-10-CM | POA: Diagnosis present

## 2020-08-07 DIAGNOSIS — G8929 Other chronic pain: Secondary | ICD-10-CM | POA: Diagnosis present

## 2020-08-07 DIAGNOSIS — Z79899 Other long term (current) drug therapy: Secondary | ICD-10-CM | POA: Insufficient documentation

## 2020-08-07 DIAGNOSIS — R269 Unspecified abnormalities of gait and mobility: Secondary | ICD-10-CM | POA: Insufficient documentation

## 2020-08-07 NOTE — Progress Notes (Signed)
Subjective:    Patient ID: Kenneth Pace, male    DOB: 21-Apr-1950, 70 y.o.   MRN: [id number]  HPI: Kenneth Pace is a 70 y.o. male who returns for follow up appointment for chronic pain and medication refill. He states his  pain is located in his lower back and bilateral knee pain. Also reports increase intensity and frequency of pain, PMP and medication list reviewed. Will prescribe Tramadol if UDS is consistent, he verbalizes understanding.  He rates his pain 4. His current exercise regime is walking , attending [facility] 3 days a week and performing stretching exercises.  Wife in room, all questions answered.    Pain Inventory Average Pain 3 Pain Right Now 4 My pain is constant, sharp, and dull  In the last 24 hours, has pain interfered with the following? General activity 2 Relation with others 0 Enjoyment of life 1 What TIME of day is your pain at its worst? evening Sleep (in general) Good  Pain is worse with: bending Pain improves with: rest Relief from Meds: 8  Family History  Problem Relation Age of Onset   Dementia Mother    Diabetes Mellitus I Mother    Hypertension Father        45   Healthy Sister    Rheum arthritis Brother    Cancer Neg Hx    Colon cancer Neg Hx    Esophageal cancer Neg Hx    Stomach cancer Neg Hx    Rectal cancer Neg Hx    Colon polyps Neg Hx    Social History   Socioeconomic History   Marital status: Married    Spouse name: Not on file   Number of children: Not on file   Years of education: Not on file   Highest education level: Not on file  Occupational History   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     Highest level of education:  BS, business admin   Right handed   Two story home   Social Determinants of Health   Financial Resource Strain: Not on file  Food Insecurity: Not on file  Transportation Needs: Not on file  Physical Activity: Not on file  Stress: Not on file  Social Connections: Not on file   Past Surgical History:  Procedure Laterality Date   ANTERIOR CERVICAL DECOMP/DISCECTOMY FUSION N/A 02/01/2017   Procedure: ANTERIOR CERVICAL DECOMPRESSION/DISCECTOMY FUSION CERVICAL THREE-FOUR , CERVICAL FOUR-FIVE  CERVICAL FIVE-SIX;  Surgeon: Consuella Lose, MD;  Location: [facility];  Service: Neurosurgery;  Laterality: N/A;   APPENDECTOMY     BACK SURGERY     COLONOSCOPY  02/05/2010   [facility]   EYE SURGERY Right    Dr. Katy Fitch   KNEE SURGERY     2 Lknee arthroscopy, 3 R knee arthroscpoy   KNEE SURGERY Right 11/12/2016   LUMBAR FUSION  03/26/2020   LUMBAR LAMINECTOMY/DECOMPRESSION MICRODISCECTOMY Right 05/22/2019   Procedure: LAMINOTOMY AND MICRODISCECTOMY RIGHT LUMBAR FOUR- LUMBAR FIVE;  Surgeon: Kathyrn Sheriff,  Nena Polio, MD;  Location: [facility];  Service: Neurosurgery;  Laterality: Right;  LAMINOTOMY AND MICRODISCECTOMY RIGHT LUMBAR FOUR- LUMBAR FIVE   LUMBAR LAMINECTOMY/DECOMPRESSION MICRODISCECTOMY Right 02/01/2020   Procedure: REDO MICRODISCECTOMY RT LUMBAR FOUR-FIVE;  Surgeon: Consuella Lose, MD;  Location: [facility];  Service: Neurosurgery;  Laterality: Right;  posterior   TONSILLECTOMY     Past Surgical History:  Procedure Laterality Date   ANTERIOR CERVICAL DECOMP/DISCECTOMY FUSION N/A 02/01/2017   Procedure: ANTERIOR CERVICAL DECOMPRESSION/DISCECTOMY FUSION CERVICAL THREE-FOUR , CERVICAL FOUR-FIVE  CERVICAL FIVE-SIX;  Surgeon: Consuella Lose, MD;  Location: [facility];  Service: Neurosurgery;  Laterality: N/A;   APPENDECTOMY     BACK SURGERY     COLONOSCOPY  02/05/2010   [facility]    EYE SURGERY Right    Dr. Katy Fitch   KNEE SURGERY     2 Lknee arthroscopy, 3 R knee arthroscpoy   KNEE SURGERY Right 11/12/2016   LUMBAR FUSION  03/26/2020   LUMBAR LAMINECTOMY/DECOMPRESSION MICRODISCECTOMY Right 05/22/2019   Procedure: LAMINOTOMY AND MICRODISCECTOMY RIGHT LUMBAR FOUR- LUMBAR FIVE;  Surgeon: Consuella Lose, MD;  Location: [facility];  Service: Neurosurgery;  Laterality: Right;  LAMINOTOMY AND MICRODISCECTOMY RIGHT LUMBAR FOUR- LUMBAR FIVE   LUMBAR LAMINECTOMY/DECOMPRESSION MICRODISCECTOMY Right 02/01/2020   Procedure: REDO MICRODISCECTOMY RT LUMBAR FOUR-FIVE;  Surgeon: Consuella Lose, MD;  Location: [facility];  Service: Neurosurgery;  Laterality: Right;  posterior   TONSILLECTOMY     Past Medical History:  Diagnosis Date   ADENOIDECTOMY, HX OF 02/16/2007   Anxiety    pt. states he does not have anxiety.   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[place])    diagnosed 11/2016   DIABETES MELLITUS, TYPE I, UNCONTROLLED 12/29/2006   ERECTILE DYSFUNCTION 02/16/2007   Hearing loss    wears hearing aids   HYPERLIPIDEMIA 02/16/2007   HYPERTENSION 02/16/2007   Nerve pain    per patient "on lower back and both legs"   Sleep apnea    uses CPAP   TESTICULAR MASS, LEFT 02/16/2007   Therapeutic opioid-induced constipation (OIC)    BP (!) 157/76 (BP Location: Left Arm, Patient Position: Sitting, Cuff Size: Normal)   Pulse 79   Temp 98.2 F (36.8 C) (Oral)   Ht 6' (1.829 m)   Wt 228 lb 9.6 oz (103.7 kg)   SpO2 97%   BMI 31.00 kg/m   Opioid Risk Score:   Fall Risk Score:  `1  Depression screen PHQ 2/9  Depression screen [facility] 2/9 05/07/2020 12/10/2017  Decreased Interest 0 0  Down, Depressed, Hopeless 0 0  PHQ - 2 Score 0 0  Some recent data might be hidden     Review of Systems  Musculoskeletal:  Positive for back pain.  All other systems reviewed and are negative.     Objective:   Physical  Exam Vitals and nursing note reviewed.  Constitutional:      Appearance: Normal appearance.  Cardiovascular:     Rate and Rhythm: Normal rate and regular rhythm.     Pulses: Normal pulses.     Heart sounds: Normal heart sounds.  Pulmonary:     Effort: Pulmonary effort is normal.     Breath sounds: Normal breath sounds.  Musculoskeletal:     Cervical back: Normal range of motion and neck supple.     Comments: Normal Muscle Bulk and Muscle Testing Reveals:  Upper Extremities: Full ROM and Muscle Strength on Right 5/5 and Left 4/5 Lumbar Paraspinal Tenderness: L-3-L-5 Lower Extremities:  Full ROM and Muscle Strength 5/5 Arises from Table slowly using cane for support   Skin:    General: Skin is warm and dry.  Neurological:     Mental Status: He is alert and oriented to person, place, and time.  Psychiatric:        Mood and Affect: Mood normal.        Behavior: Behavior normal.         Assessment & Plan:  CIDP: Chronic Inflammatory Demyelinating Polyneuropathy: He is scheduled for IVIG: Neurology Following. UDS was ordered today, if results consistent. Will order Tramadol. Contract signed and reviewed per office policy.  Neurologic Gait Disorder: Continue use with assistive device at all times. He s using cane today.  3. Chronic Pain Syndrome: Continue elavil continue to monitor. Awaiting UDS results.  4. Chronic Low Back Pain without Sciatica: Continue HEP as Tolerated . Continue to Monitor.  Bilateral Knee Pain: Continue HEP as Tolerated. Continue to monitor.   F/U in 3 months

## 2020-08-09 ENCOUNTER — Other Ambulatory Visit: Payer: Self-pay

## 2020-08-09 ENCOUNTER — Ambulatory Visit (HOSPITAL_COMMUNITY)
Admission: RE | Admit: 2020-08-09 | Discharge: 2020-08-09 | Disposition: A | Discharge status: Home / Self Care | Payer: Medicare Other | Source: Ambulatory Visit | Attending: Neurology | Admitting: Neurology

## 2020-08-09 DIAGNOSIS — G6181 Chronic inflammatory demyelinating polyneuritis: Secondary | ICD-10-CM | POA: Diagnosis not present

## 2020-08-09 MED ORDER — IMMUNE GLOBULIN (HUMAN) 10 GM/100ML IV SOLN
105.0000 g | INTRAVENOUS | Status: DC
  Administered 2020-08-09: 105 g via INTRAVENOUS
  Filled 2020-08-09: qty 1050
  Filled 2020-08-09: qty 1000

## 2020-08-12 ENCOUNTER — Encounter: Payer: Self-pay | Admitting: Registered Nurse

## 2020-08-14 ENCOUNTER — Other Ambulatory Visit: Payer: Self-pay

## 2020-08-14 ENCOUNTER — Ambulatory Visit (INDEPENDENT_AMBULATORY_CARE_PROVIDER_SITE_OTHER): Payer: Medicare Other | Admitting: Endocrinology

## 2020-08-14 ENCOUNTER — Telehealth: Payer: Self-pay | Admitting: Endocrinology

## 2020-08-14 VITALS — BP 140/82 | HR 76 | Ht 72.0 in | Wt 229.2 lb

## 2020-08-14 DIAGNOSIS — E1042 Type 1 diabetes mellitus with diabetic polyneuropathy: Secondary | ICD-10-CM

## 2020-08-14 LAB — POCT GLYCOSYLATED HEMOGLOBIN (HGB A1C): Hemoglobin A1C: 7.9 % — AB (ref 4.0–5.6)

## 2020-08-14 NOTE — Telephone Encounter (Signed)
Pharmacy called stating patient needs a refill for Humalog and according to patient's wife, it should be 120 units, not 92 units. (Pharmacy asked that we just make sure the DX code is on Rx to ensure that it goes through)  [place]  [place], [place] [address]  Phone:  [phone]  Fax:  [phone]

## 2020-08-14 NOTE — Patient Instructions (Addendum)
Please take these pump settings:  basal rate of 4.2 units/hr 6 AM-10 PM, and 2.4 units/hr 10 PM-6 AM (when not in auto mode). Mealtime bolus of 1 unit/6 grams cardohydrate correction bolus (which some people call "sensitivity," or "insulin sensitivity ratio," or just "isr") of 1 unit for each 50 by which your glucose exceeds 100.   suspend the pump for 1-2 hrs, with activity.  If not, eat a light snack with it.   On this type of insulin schedule, you should eat meals on a regular schedule.  If a meal is missed or significantly delayed, your blood sugar could go low.   Please see Kenneth Pace, to go over carbohydrate counting.    Please come back for a follow-up appointment in 3 months.

## 2020-08-14 NOTE — Progress Notes (Signed)
Subjective:    Patient ID: Kenneth Pace, male    DOB: Oct 25, 1950, 70 y.o.   MRN: [id number]  HPI Pt returns for f/u of diabetes mellitus:  DM type: 1 Dx'ed: 2330 Complications: DR and PN. Therapy: insulin since dx.  DKA: only at dx.   Severe hypoglycemia: most recently in 2014.  Pancreatitis: never SDOH: he is retired Other: he has a Advice worker 670 pump and continuous glucose monitor; he is retired. continuous glucose monitor ID is [email or web address] and password is [email or web address].   Interval history: he takes these pump settings:  basal rate of 4.2 units/hr 6 AM-10 PM, and 2.4 units/hr 10 PM-6 AM (when not in auto mode).   Mealtime bolus of 1 unit/7 grams cardohydrate correction bolus (which some people call "sensitivity," or "insulin sensitivity ratio," or just "isr") of 1 unit for each 30 by which your glucose exceeds 100.   suspend the pump for 1-2 hrs, with activity.  If not, eat a light snack with it.  TDD is 101 units (73% basal).  He takes 2.1 meal boluses per day  I reviewed continuous glucose monitor data.  Glucose varies from 40-350.  It decreases overnight, but there is otherwise little change throughout the day.  He has hypoglycemia approx once per week.  This happens fasting, or with exertion.   Past Medical History:  Diagnosis Date   ADENOIDECTOMY, HX OF 02/16/2007   Anxiety    pt. states he does not have anxiety.   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[place])    diagnosed 11/2016   DIABETES MELLITUS, TYPE I, UNCONTROLLED 12/29/2006   ERECTILE DYSFUNCTION 02/16/2007   Hearing loss    wears hearing aids   HYPERLIPIDEMIA 02/16/2007   HYPERTENSION 02/16/2007   Nerve pain    per patient "on lower back and both legs"   Sleep apnea    uses CPAP   TESTICULAR MASS, LEFT 02/16/2007   Therapeutic opioid-induced constipation (OIC)     Past Surgical History:  Procedure Laterality Date   ANTERIOR  CERVICAL DECOMP/DISCECTOMY FUSION N/A 02/01/2017   Procedure: ANTERIOR CERVICAL DECOMPRESSION/DISCECTOMY FUSION CERVICAL THREE-FOUR , CERVICAL FOUR-FIVE  CERVICAL FIVE-SIX;  Surgeon: Consuella Lose, MD;  Location: [place];  Service: Neurosurgery;  Laterality: N/A;   APPENDECTOMY     BACK SURGERY     COLONOSCOPY  02/05/2010   [facility]   EYE SURGERY Right    Dr. Katy Fitch   KNEE SURGERY     2 Lknee arthroscopy, 3 R knee arthroscpoy   KNEE SURGERY Right 11/12/2016   LUMBAR FUSION  03/26/2020   LUMBAR LAMINECTOMY/DECOMPRESSION MICRODISCECTOMY Right 05/22/2019   Procedure: LAMINOTOMY AND MICRODISCECTOMY RIGHT LUMBAR FOUR- LUMBAR FIVE;  Surgeon: Consuella Lose, MD;  Location: [place];  Service: Neurosurgery;  Laterality: Right;  LAMINOTOMY AND MICRODISCECTOMY RIGHT LUMBAR FOUR- LUMBAR FIVE   LUMBAR LAMINECTOMY/DECOMPRESSION MICRODISCECTOMY Right 02/01/2020   Procedure: REDO MICRODISCECTOMY RT LUMBAR FOUR-FIVE;  Surgeon: Consuella Lose, MD;  Location: [place];  Service: Neurosurgery;  Laterality: Right;  posterior   TONSILLECTOMY      Social History   Socioeconomic History   Marital status: Married    Spouse name: Not on file   Number of children: Not on file   Years of education: Not on file   Highest education level: Not on file  Occupational History   Occupation: Lobbyist: [employer]    Comment: [employer]  Tobacco Use   Smoking status:  Never   Smokeless tobacco: Never  Vaping Use   Vaping Use: Never used  Substance and Sexual Activity   Alcohol use: No   Drug use: No   Sexual activity: Not Currently  Other Topics Concern   Not on file  Social History Narrative   He works for [employer]   He lives at home with wife.     Highest level of education:  BS, business admin   Right handed   Two story home   Social Determinants of Health   Financial Resource Strain: Not on file  Food Insecurity: Not on file  Transportation  Needs: Not on file  Physical Activity: Not on file  Stress: Not on file  Social Connections: Not on file  Intimate Partner Violence: Not on file    Current Outpatient Medications on File Prior to Visit  Medication Sig Dispense Refill   acetaminophen (TYLENOL) 500 MG tablet Take 500 mg by mouth every 6 (six) hours as needed for mild pain.     amitriptyline (ELAVIL) 75 MG tablet Take 1 tablet (75 mg total) by mouth at bedtime. 90 tablet 1   B Complex Vitamins (B-COMPLEX/B-12 PO) Take 1 tablet by mouth at bedtime.     celecoxib (CELEBREX) 100 MG capsule Take 1 capsule (100 mg total) by mouth at bedtime. 90 capsule 1   clotrimazole-betamethasone (LOTRISONE) cream Apply 1 application topically 2 (two) times daily. (Patient taking differently: Apply 1 application topically daily as needed (Dry skin).) 90 g 2   Cranberry 400 MG TABS Take 400 mg by mouth at bedtime.     DULoxetine (CYMBALTA) 60 MG capsule Take 1 capsule (60 mg total) by mouth daily. 90 capsule 1   finasteride (PROSCAR) 5 MG tablet Take 1 tablet (5 mg total) by mouth at bedtime. 90 tablet 2   glucagon 1 MG injection Inject 1 mg into the vein once as needed. 1 each 12   glucose blood (BAYER CONTOUR NEXT TEST) test strip MEDICALLY NECESSARY FOR USE WITH PUMP; Use to check blood sugar 5 times per day and prn; E11.42 500 each 3   Immune Globulin 10% (IMMUNE GLOBULIN 10%) 10G/136mL (10,000mg /151mL) SOLN Inject 105 g into the vein every 6 (six) weeks. 289.5 mL 10   Insulin Human (INSULIN PUMP) SOLN Inject 120 each into the skin daily. HUMALOG     Insulin Infusion Pump Supplies (PARADIGM RESERVOIR 3ML) MISC 1 Device by Does not apply route every 3 (three) days. 30 each 3   Insulin Infusion Pump Supplies (QUICK-SET INFUSION 43" 9MM) MISC 1 Device by Does not apply route every 3 (three) days. 30 each 3   lidocaine (LIDODERM) 5 % At 7 am and remove at 7 pm. Apply 1 on each side. (Patient taking differently: Place 2 patches onto the skin daily as  needed (pain).) 180 patch 2   lisinopril-hydrochlorothiazide (ZESTORETIC) 10-12.5 MG tablet Take 1 tablet by mouth at bedtime. 90 tablet 3   methocarbamol (ROBAXIN-750) 750 MG tablet Take 1 tablet (750 mg total) by mouth 3 (three) times daily as needed for muscle spasms. 90 tablet 1   Multiple Vitamin (MULTIVITAMIN) capsule Take 1 capsule by mouth at bedtime.     pseudoephedrine (SUDAFED) 30 MG tablet Take 30 mg by mouth at bedtime as needed for congestion.     scopolamine (TRANSDERM-SCOP) 1 MG/3DAYS Place 1 patch (1.5 mg total) onto the skin every 3 (three) days. 10 patch 12   No current facility-administered medications on  file prior to visit.    No Known Allergies  Family History  Problem Relation Age of Onset   Dementia Mother    Diabetes Mellitus I Mother    Hypertension Father        65   Healthy Sister    Rheum arthritis Brother    Cancer Neg Hx    Colon cancer Neg Hx    Esophageal cancer Neg Hx    Stomach cancer Neg Hx    Rectal cancer Neg Hx    Colon polyps Neg Hx     BP 140/82   Pulse 76   Ht 6' (1.829 m)   Wt 229 lb 3.2 oz (104 kg)   SpO2 97%   BMI 31.09 kg/m   Review of Systems Denies LOC.     Objective:   Physical Exam Pulses: dorsalis pedis intact bilat.   MSK: no deformity of the feet CV: 2+ bilat leg edema Skin:  no ulcer on the feet.  normal color and temp on the feet.   Neuro: sensation is intact to touch on the feet, but decreased from normal.   Ext: there is bilateral onychomycosis of the toenails.     Lab Results  Component Value Date   HGBA1C 7.9 (A) 08/14/2020       Assessment & Plan:  Type 1 DM:  uncontrolled.   Patient Instructions  Please take these pump settings:  basal rate of 4.2 units/hr 6 AM-10 PM, and 2.4 units/hr 10 PM-6 AM (when not in auto mode). Mealtime bolus of 1 unit/6 grams cardohydrate correction bolus (which some people call "sensitivity," or "insulin sensitivity ratio," or just "isr") of 1 unit for each 50 by  which your glucose exceeds 100.   suspend the pump for 1-2 hrs, with activity.  If not, eat a light snack with it.   On this type of insulin schedule, you should eat meals on a regular schedule.  If a meal is missed or significantly delayed, your blood sugar could go low.   Please see Vaughan Basta, to go over carbohydrate counting.    Please come back for a follow-up appointment in 3 months.

## 2020-08-15 ENCOUNTER — Telehealth: Payer: Self-pay | Admitting: *Deleted

## 2020-08-15 ENCOUNTER — Telehealth: Payer: Self-pay | Admitting: Registered Nurse

## 2020-08-15 ENCOUNTER — Ambulatory Visit (INDEPENDENT_AMBULATORY_CARE_PROVIDER_SITE_OTHER): Payer: Medicare Other | Admitting: Neurology

## 2020-08-15 ENCOUNTER — Encounter: Payer: Self-pay | Admitting: Neurology

## 2020-08-15 VITALS — BP 155/75 | HR 75 | Ht 72.0 in | Wt 227.0 lb

## 2020-08-15 DIAGNOSIS — G959 Disease of spinal cord, unspecified: Secondary | ICD-10-CM

## 2020-08-15 DIAGNOSIS — R261 Paralytic gait: Secondary | ICD-10-CM | POA: Diagnosis not present

## 2020-08-15 DIAGNOSIS — G6181 Chronic inflammatory demyelinating polyneuritis: Secondary | ICD-10-CM | POA: Diagnosis not present

## 2020-08-15 LAB — TOXASSURE SELECT,+ANTIDEPR,UR

## 2020-08-15 MED ORDER — TRAMADOL HCL 50 MG PO TABS: 50.0000 mg | ORAL_TABLET | Freq: Every day | ORAL | 1 refills | Status: DC | PRN

## 2020-08-15 NOTE — Telephone Encounter (Signed)
Urine drug screen for this encounter is consistent for prescribed medication 

## 2020-08-15 NOTE — Patient Instructions (Signed)
Return to clinic 6 months

## 2020-08-15 NOTE — Telephone Encounter (Signed)
PMP was Reviewed: UDS was Reviewed Tramadol e-scrbed today.

## 2020-08-15 NOTE — Telephone Encounter (Signed)
Tramadol never called in

## 2020-08-15 NOTE — Progress Notes (Signed)
Follow-up Visit   Date: 08/15/20   Kenneth Pace MRN: [id number] DOB: [birth year]   Interim History: Kenneth Pace is a 70 y.o. right-handed male with insulin-dependent diabetes mellitus, hypertension, cervical myelopathy s/p decompression at C3-C6, and s/p L4 laminectomy (05/2019), and s/p L4-5 PLIF (01/2020) returning to the clinic for follow-up of CIDP  The patient was accompanied to the clinic by self.  He is here for 6 month follow-up visit.  He remains on IVIG 1g/kg every 6 weeks and continues to do well with respect to CIDP.  At his last visit, he was having severe pain, leg weakness which started following back surgery.  He was found to have repeat disc herniation at L4-5 and underwent surgery again in February 2022.  He has been going to PT which has improved leg strength.    He retired in 2021 and has paperwork to see if he would qualify for disability.    Medications:  Current Outpatient Medications on File Prior to Visit  Medication Sig Dispense Refill   acetaminophen (TYLENOL) 500 MG tablet Take 500 mg by mouth every 6 (six) hours as needed for mild pain.     amitriptyline (ELAVIL) 75 MG tablet Take 1 tablet (75 mg total) by mouth at bedtime. 90 tablet 1   B Complex Vitamins (B-COMPLEX/B-12 PO) Take 1 tablet by mouth at bedtime.     celecoxib (CELEBREX) 100 MG capsule Take 1 capsule (100 mg total) by mouth at bedtime. 90 capsule 1   clotrimazole-betamethasone (LOTRISONE) cream Apply 1 application topically 2 (two) times daily. (Patient taking differently: Apply 1 application topically daily as needed (Dry skin).) 90 g 2   Cranberry 400 MG TABS Take 400 mg by mouth at bedtime.     DULoxetine (CYMBALTA) 60 MG capsule Take 1 capsule (60 mg total) by mouth daily. 90 capsule 1   finasteride (PROSCAR) 5 MG tablet Take 1 tablet (5 mg total) by mouth at bedtime. 90 tablet 2   glucagon 1 MG injection Inject 1 mg into the vein once as needed. 1 each 12   glucose blood  (BAYER CONTOUR NEXT TEST) test strip MEDICALLY NECESSARY FOR USE WITH PUMP; Use to check blood sugar 5 times per day and prn; E11.42 500 each 3   Immune Globulin 10% (IMMUNE GLOBULIN 10%) 10G/131mL (10,000mg /168mL) SOLN Inject 105 g into the vein every 6 (six) weeks. 289.5 mL 10   Insulin Human (INSULIN PUMP) SOLN Inject 120 each into the skin daily. HUMALOG     Insulin Infusion Pump Supplies (PARADIGM RESERVOIR 3ML) MISC 1 Device by Does not apply route every 3 (three) days. 30 each 3   Insulin Infusion Pump Supplies (QUICK-SET INFUSION 43" 9MM) MISC 1 Device by Does not apply route every 3 (three) days. 30 each 3   insulin lispro (HUMALOG) 100 UNIT/ML injection INJECT 92 UNITS DAILY IN PUMP E10.42 120 mL 3   lidocaine (LIDODERM) 5 % At 7 am and remove at 7 pm. Apply 1 on each side. (Patient taking differently: Place 2 patches onto the skin daily as needed (pain).) 180 patch 2   lisinopril-hydrochlorothiazide (ZESTORETIC) 10-12.5 MG tablet Take 1 tablet by mouth at bedtime. 90 tablet 3   methocarbamol (ROBAXIN-750) 750 MG tablet Take 1 tablet (750 mg total) by mouth 3 (three) times daily as needed for muscle spasms. 90 tablet 1   Multiple Vitamin (MULTIVITAMIN) capsule Take 1 capsule by mouth at bedtime.     pseudoephedrine (SUDAFED) 30 MG tablet Take  30 mg by mouth at bedtime as needed for congestion.     scopolamine (TRANSDERM-SCOP) 1 MG/3DAYS Place 1 patch (1.5 mg total) onto the skin every 3 (three) days. 10 patch 12   baclofen (LIORESAL) 10 MG tablet Take 2 tablets (20 mg total) by mouth 3 (three) times daily. (Patient not taking: Reported on 08/15/2020) 540 tablet 3   No current facility-administered medications on file prior to visit.    Allergies: No Known Allergies    Vital Signs:  BP (!) 155/75   Pulse 75   Ht 6' (1.829 m)   Wt 227 lb (103 kg)   SpO2 99%   BMI 30.79 kg/m   Neurological Exam: MENTAL STATUS including orientation to time, place, person, recent and remote memory,  attention span and concentration, language, and fund of knowledge is normal.  Speech is not dysarthric.  CRANIAL NERVES: Normal conjugate, extra-ocular eye movements in all directions of gaze.  No ptosis.   MOTOR:  There is moderate right FDI and ADM atrophy.  No fasciculations or abnormal movements.  No pronator drift.    Right Upper Extremity:    Left Upper Extremity:    Deltoid  5/5   Deltoid  5/5   Biceps  5/5   Biceps  5/5   Triceps  5/5   Triceps  5/5   Wrist extensors  5/5   Wrist extensors  5/5   Wrist flexors  5/5   Wrist flexors  5/5   Finger extensors  5/5   Finger extensors  5/5   Finger flexors  5/5   Finger flexors  5/5   Dorsal interossei  5/5   Dorsal interossei  5/5   Abductor pollicis  5/5   Abductor pollicis  5/5   Tone (Ashworth scale)  0  Tone (Ashworth scale)  0   Right Lower Extremity:    Left Lower Extremity:    Hip flexors  4+/5   Hip flexors  4+/5   Hip extensors  5/5   Hip extensors  5/5   Knee flexors  5/5   Knee flexors  5/5   Knee extensors  5/5   Knee extensors  5/5   Dorsiflexors  5/5   Dorsiflexors  5/5   Plantarflexors  5/5   Plantarflexors  5/5   Toe extensors  5/5   Toe extensors  5/5   Toe flexors  5/5   Toe flexors  5/5   Tone (Ashworth scale)  0  Tone (Ashworth scale)  0    MSRs:  Right                                                                 Left brachioradialis 2+  brachioradialis 2+  biceps 2+  biceps 2+  triceps 2+  triceps 2+  patellar 1+  patellar 1+  ankle jerk 2+  ankle jerk 2+   SENSORY:  Vibration 100% at the knees and mildly reduced at the ankles.    COORDINATION/GAIT: Gait is slow, unassisted, mildly spastic appearing, but appears more stable with a cane.     Data: MRI cervical and lumbar spine 11/20/2016: 1. No focal cord signal abnormality or lesion. 2. Multilevel spondylosis of the cervical spine as described. 3. Moderate central and mild bilateral  foraminal stenosis at C3-4. 4. Moderate central and bilateral  foraminal narrowing at C4-5. 5. Moderate central and severe bilateral foraminal stenosis at C5-6. 6. Moderate foraminal narrowing bilaterally at C6-7 without significant central canal stenosis. 7. Short pedicles in the cervical and lumbar spine contribute to the stenosis. 8. Disc bulging at L2-3 and L3-4 without significant stenosis. 9. Disc bulging and facet hypertrophy at L4-5 with mild subarticular and foraminal stenosis bilaterally. 10. Mild left foraminal narrowing at L5-S1 secondary to asymmetric facet hypertrophy.  MRI lumbar spine wo contrast 05/07/2019:  1. New large right paracentral L4-5 disc extrusion with cranial extension within the subarticular recess above the L4-5 level and impinging the right L4 nerve root. 2. Mild progression of lumbar spondylosis at L3-L4 where there is mild left foraminal stenosis at L3-4. 3. Mild to moderate bilateral foraminal stenosis at L4-L5. 4. No canal stenosis at any level.  CSF 11/20/2016:  R1 W3 P81* G102*  No OCB, VDRL neg Labs 11/20/2016:  SPEP with IFE no M protein, 486, TSH 1.051  Lab Results  Component Value Date   HGBA1C 7.9 (A) 08/14/2020   MRI cervical spine 12/31/2016:   1. No enlargement of the cranial nerves or cervical nerve roots. 2. No acute intracranial abnormality. 3. Multilevel moderate cervical spinal canal stenosis, worst at C3-4 and C5-6 with associated mild cord deformity but no cord signal change. 4. Moderate to severe neural foraminal stenosis at C3-4, C4-5 and C5-6.  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  When compared to his previous study on 11/10/2016, there is mild interval improvement.  Chronic C6 radiculopathy affecting the right upper extremity, mild in degree electrically.  There is no evidence of a sensorimotor polyneuropathy or lumbosacral radiculopathy  affecting the right lower extremity.    IMPRESSION/PLAN: 1.  Chronic inflammatory demyelinating polyradiculoneuropathy, diagnosed 2018.  His neuropathy has been stable and well-controlled on IVIG.    - Continue IVIG 1g/kg every 6 weeks  - Consider adding azathioprine or cellcept going forward, if unable to taper IVIG  2.  Low back pain s/p L4-5 PLIF with redo in 03/2020 by Dr. Kathyrn Sheriff, improving leg stregnth  3.  Cervical myelopathy s/p decompression at C3-6   - He has tapered off baclofen and takes robaxin TID prn  Based on his current neurological deficits, I do not think he will be able to obtain gainful employment and have signed a disability form to support this for patient.   Return to clinic in 6 months    Thank you for allowing me to participate in patient's care.  If I can answer any additional questions, I would be pleased to do so.    Sincerely,    Beza Steppe K. Posey Pronto, DO

## 2020-08-16 MED ORDER — HUMALOG 100 UNIT/ML ~~LOC~~ SOLN: SUBCUTANEOUS | 3 refills | Status: DC

## 2020-08-29 LAB — HM DIABETES EYE EXAM

## 2020-08-30 ENCOUNTER — Encounter: Payer: Self-pay | Admitting: Family Medicine

## 2020-09-04 ENCOUNTER — Ambulatory Visit: Payer: Medicare Other | Admitting: Nutrition

## 2020-09-11 ENCOUNTER — Encounter: Payer: Medicare Other | Attending: Endocrinology | Admitting: Nutrition

## 2020-09-11 ENCOUNTER — Other Ambulatory Visit: Payer: Self-pay

## 2020-09-11 ENCOUNTER — Other Ambulatory Visit: Payer: Self-pay | Admitting: Physical Medicine & Rehabilitation

## 2020-09-11 DIAGNOSIS — E119 Type 2 diabetes mellitus without complications: Secondary | ICD-10-CM | POA: Diagnosis present

## 2020-09-11 DIAGNOSIS — E1165 Type 2 diabetes mellitus with hyperglycemia: Secondary | ICD-10-CM

## 2020-09-11 NOTE — Progress Notes (Signed)
Patient is here with his wife to learn carb counting.  We reviewed what carbs were, and their respective 15 gram portion sizes.  He is not measuring rice, pasta, or cereal. Stressed need to do this a least once to get an idea of how much he is eating.  Also discussed label reading and eating out, and how to guestimate portion sizes using hand and fingers.   Reviewed need to add 1-2 units when eating high fat, and to delay the bolus until after eating.  He is not doing this, and says it is easier than doing an extended bolus, because he gets confused doing this.  Was not counting some foods as carbs like peas, beans and wrong on estimating size of baked potatoes he is eating.  He took notes and handouts given on 15 gram portion sizes, and label reading.  Also encouraged him to download the Calorie [facility] app for when he eats out.  He agreed to use this and do this when he gets home.   They had no final questions.

## 2020-09-11 NOTE — Patient Instructions (Signed)
Review handouts given on label reading and 15 gram portions sizes of all carbs Download Calorie [facility] app for when you eat out. Add 1-2u more to bolus when eating high fat meals, and take this after the meal

## 2020-09-12 ENCOUNTER — Telehealth: Payer: Self-pay | Admitting: Registered Nurse

## 2020-09-12 NOTE — Telephone Encounter (Signed)
Spoke with Ms. Kenneth Pace, Mr. Stenglein tolerating Cymbalta, Amitriptyline and Celebrex. Refill sent to [facility], she verbalizes understanding.

## 2020-09-19 ENCOUNTER — Other Ambulatory Visit: Payer: Self-pay

## 2020-09-20 ENCOUNTER — Ambulatory Visit (HOSPITAL_COMMUNITY)
Admission: RE | Admit: 2020-09-20 | Discharge: 2020-09-20 | Disposition: A | Discharge status: Home / Self Care | Payer: Medicare Other | Source: Ambulatory Visit | Attending: Neurology | Admitting: Neurology

## 2020-09-20 DIAGNOSIS — G6181 Chronic inflammatory demyelinating polyneuritis: Secondary | ICD-10-CM | POA: Insufficient documentation

## 2020-09-20 MED ORDER — IMMUNE GLOBULIN (HUMAN) 10 GM/100ML IV SOLN
105.0000 g | INTRAVENOUS | Status: DC
Start: 2020-09-20 — End: 2020-09-21
  Administered 2020-09-20: 105 g via INTRAVENOUS
  Filled 2020-09-20: qty 800

## 2020-10-01 ENCOUNTER — Other Ambulatory Visit: Payer: Self-pay

## 2020-10-01 ENCOUNTER — Ambulatory Visit (INDEPENDENT_AMBULATORY_CARE_PROVIDER_SITE_OTHER): Payer: Medicare Other | Admitting: Family Medicine

## 2020-10-01 ENCOUNTER — Encounter: Payer: Self-pay | Admitting: Family Medicine

## 2020-10-01 VITALS — BP 140/82 | HR 82 | Temp 98.0°F | Ht 72.0 in | Wt 226.5 lb

## 2020-10-01 DIAGNOSIS — M25562 Pain in left knee: Secondary | ICD-10-CM | POA: Diagnosis not present

## 2020-10-01 DIAGNOSIS — N401 Enlarged prostate with lower urinary tract symptoms: Secondary | ICD-10-CM

## 2020-10-01 DIAGNOSIS — R350 Frequency of micturition: Secondary | ICD-10-CM

## 2020-10-01 DIAGNOSIS — G8929 Other chronic pain: Secondary | ICD-10-CM | POA: Diagnosis not present

## 2020-10-01 DIAGNOSIS — I10 Essential (primary) hypertension: Secondary | ICD-10-CM | POA: Diagnosis not present

## 2020-10-01 DIAGNOSIS — R972 Elevated prostate specific antigen [PSA]: Secondary | ICD-10-CM

## 2020-10-01 LAB — PSA: PSA: 1.08 ng/mL (ref 0.10–4.00)

## 2020-10-01 MED ORDER — METHYLPREDNISOLONE ACETATE 40 MG/ML IJ SUSP
40.0000 mg | Freq: Once | INTRAMUSCULAR | Status: AC
Start: 2020-10-01 — End: 2020-10-01
  Administered 2020-10-01: 40 mg via INTRA_ARTICULAR

## 2020-10-01 MED ORDER — LIDOCAINE 5 % EX PTCH: MEDICATED_PATCH | CUTANEOUS | 2 refills | Status: DC

## 2020-10-01 MED ORDER — SCOPOLAMINE 1 MG/3DAYS TD PT72: 1.0000 | MEDICATED_PATCH | TRANSDERMAL | 2 refills | Status: DC

## 2020-10-01 NOTE — Patient Instructions (Addendum)
Give [place] 2-3 business days to get the results of your labs back.   Keep the diet clean and stay active.  Happy to give you steroid injections to prolong a knee replacement.   Let us know if you need anything.

## 2020-10-01 NOTE — Progress Notes (Signed)
Chief Complaint  Patient presents with   Follow-up    Recheck PSA    Subjective Kenneth Pace is a 70 y.o. male who presents for hypertension follow up. Here w wife.  He does not monitor home blood pressures. He is compliant with medications. Patient has these side effects of medication: none He is adhering to a healthy diet overall. Current exercise: water aerobics, walking, some wt resistance exercise No CP or SOB.  BPH Taking Finasteride 5 mg/d. No AE's, compliant. He has a hx of a TURP. He has weakened stream, freq, incomplete emptying. No bleeding, d/c.   Chronic L knee pain Gets injection, was told that he needs a replacement but has been going thru a lot of health issues and wants to put it off. Has done well w steroid injections in the past.    Past Medical History:  Diagnosis Date   ADENOIDECTOMY, HX OF 02/16/2007   Anxiety    pt. states he does not have anxiety.   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[place])    diagnosed 11/2016   DIABETES MELLITUS, TYPE I, UNCONTROLLED 12/29/2006   ERECTILE DYSFUNCTION 02/16/2007   Hearing loss    wears hearing aids   HYPERLIPIDEMIA 02/16/2007   HYPERTENSION 02/16/2007   Nerve pain    per patient "on lower back and both legs"   Sleep apnea    uses CPAP   TESTICULAR MASS, LEFT 02/16/2007   Therapeutic opioid-induced constipation (OIC)    Exam BP 140/82   Pulse 82   Temp 98 F (36.7 C) (Oral)   Ht 6' (1.829 m)   Wt 226 lb 8 oz (102.7 kg)   SpO2 98%   BMI 30.72 kg/m  General:  well developed, well nourished, in no apparent distress Heart: RRR, no bruits, no LE edema Lungs: clear to auscultation, no accessory muscle use Psych: well oriented with normal range of affect and appropriate judgment/insight  Procedure Note; L knee injection Verbal consent obtained. The area of the antero-lateral joint line was palpated and cleaned with alcohol  x1. Freeze spray used.  A 27-gauge needle was used to enter the joint space anterolaterally with ease. 40 mg of Depomedrol with 2 mL of 1% lidocaine was injected. A bandaid was placed.  The patient tolerated the procedure well. There were no complications noted.  Essential hypertension  Benign prostatic hyperplasia with urinary frequency  Increased prostate specific antigen (PSA) velocity - Plan: PSA  Chronic pain of left knee - Plan: methylPREDNISolone acetate (DEPO-MEDROL) injection 40 mg, PR DRAIN/INJECT LARGE JOINT/BURSA  Chronic, stable. Cont Zestoretic 10-12.5 mg/d. Counseled on diet and exercise. Chronic, unstable. He will reach out to his urologist. Failed Flomax. Cont Finasteride 5 mg/d for now. Ck PSA. Injection today. Trying to hold off on replacement for now.  F/u in 6 mo. The patient and his wife voiced understanding and agreement to the plan.  [place], DO 10/01/20  10:58 AM

## 2020-10-02 ENCOUNTER — Telehealth: Payer: Self-pay

## 2020-10-02 ENCOUNTER — Other Ambulatory Visit: Payer: Self-pay | Admitting: Family Medicine

## 2020-10-02 MED ORDER — PROCHLORPERAZINE MALEATE 5 MG PO TABS: 5.0000 mg | ORAL_TABLET | Freq: Four times a day (QID) | ORAL | 0 refills | Status: DC | PRN

## 2020-10-02 MED ORDER — SCOPOLAMINE 1 MG/3DAYS TD PT72: 1.0000 | MEDICATED_PATCH | TRANSDERMAL | 1 refills | Status: DC

## 2020-10-02 NOTE — Telephone Encounter (Signed)
Patient's wife called stating the wrong script was sent in to [facility] and it isn't equivalent to the transdermal patches that they requested , and stated side effects would cause an issue.   Patient's wife wants scopolamine sent to [facility]

## 2020-10-02 NOTE — Telephone Encounter (Signed)
I spoke to the patient last night and he said the alternative was fine to send in.

## 2020-10-02 NOTE — Telephone Encounter (Signed)
Spoke to the patients wife and they do not want compazine.  They would like for them to be sent to their local pharmacy and hopefully they will have them.Marland Kitchen

## 2020-10-02 NOTE — Telephone Encounter (Signed)
Let pt/wife know [facility] is out of scope patches. Compazine isn't a big concern for AE's for elderly like Dramamine, but if they want me to send the patches elsewhere, I can do that. Ty.

## 2020-10-02 NOTE — Telephone Encounter (Signed)
Called both numbers left message to call back.

## 2020-10-02 NOTE — Telephone Encounter (Signed)
We were contacted by [facility] stating they were out of this. I thought we asked if he wanted an alternative? I can send the scope patches somewhere else and we can cancel the Compazine. Ty,

## 2020-10-02 NOTE — Addendum Note (Signed)
Addended by: Ames Coupe on: 10/02/2020 04:49 PM   Modules accepted: Orders

## 2020-10-02 NOTE — Telephone Encounter (Signed)
Will send. Can we cancel the compazine rx to [facility] also please? Ty.

## 2020-10-09 ENCOUNTER — Telehealth: Payer: Self-pay | Admitting: Family Medicine

## 2020-10-09 NOTE — Telephone Encounter (Signed)
Called the patient left message to call back 

## 2020-10-09 NOTE — Telephone Encounter (Signed)
Needs prior authorization for: Name of medicine: lidocaine (LIDODERM) 5 % Name of Pharmacy: [facility]: Phone: [phone] and fax: [phone] Date of filling attempt: 10/01/2020 They need an explanation of why an exception is needed for the medicine: 3 back surgery within a year, allergy to oral suggested medication, and CIDP.

## 2020-10-09 NOTE — Telephone Encounter (Signed)
Have completed the form and will give to PCP to complete.

## 2020-10-12 ENCOUNTER — Other Ambulatory Visit: Payer: Self-pay | Admitting: Endocrinology

## 2020-10-29 ENCOUNTER — Other Ambulatory Visit: Payer: Self-pay

## 2020-10-29 ENCOUNTER — Encounter: Payer: Self-pay | Admitting: Registered Nurse

## 2020-10-29 ENCOUNTER — Encounter: Payer: Medicare Other | Attending: Physical Medicine & Rehabilitation | Admitting: Registered Nurse

## 2020-10-29 VITALS — BP 132/77 | HR 83 | Temp 98.4°F | Ht <= 58 in | Wt 226.6 lb

## 2020-10-29 DIAGNOSIS — G894 Chronic pain syndrome: Secondary | ICD-10-CM | POA: Diagnosis not present

## 2020-10-29 DIAGNOSIS — Z5181 Encounter for therapeutic drug level monitoring: Secondary | ICD-10-CM

## 2020-10-29 DIAGNOSIS — Z79899 Other long term (current) drug therapy: Secondary | ICD-10-CM | POA: Diagnosis present

## 2020-10-29 DIAGNOSIS — G6181 Chronic inflammatory demyelinating polyneuritis: Secondary | ICD-10-CM

## 2020-10-29 DIAGNOSIS — R269 Unspecified abnormalities of gait and mobility: Secondary | ICD-10-CM | POA: Diagnosis not present

## 2020-10-29 NOTE — Patient Instructions (Addendum)
Take Tramadol twice a day for pain as needed.   Decrease Tylenol to 2000 mg per day.    Send a My Chart Message or call on 09//20/2022, for update on medication change

## 2020-10-29 NOTE — Progress Notes (Signed)
Subjective:    Patient ID: Kenneth Pace, male    DOB: 06/06/1950, 70 y.o.   MRN: [id number]  HPI: Kenneth Pace is a 70 y.o. male who returns for follow up appointment for chronic pain and medication refill. He states his pain is located in his lower back .He rates his pain 3. His current exercise regime is walking and performing stretching exercises.  Kenneth Pace reports Kenneth Pace takes 51- 3000 mg tylenol/24 hrs day, he was instructed to decrease his Tylenol to 2000 mg/day, they verbalize understanding. He was also taking Advil and he is currently on Celebrex, he was instructed not to take the Advil while being on Celebrex, he verbalizes understanding. He was instructed to take his Tramadol twice a day as needed for pain, to call or send a My chart next week to evaluate medication change, they verbalize understanding.   Kenneth Pace  Morphine equivalent is 5.00 MME, with change in Tramadol to BID MME will change to 10.00   Last UDS was Performed on 08/07/2020, it was consistent.    Kenneth Pace in room all questions answered.      Pain Inventory Average Pain 3 Pain Right Now 3 My pain is intermittent, sharp, and dull  In the last 24 hours, has pain interfered with the following? General activity 2 Relation with others 1 Enjoyment of life 2 What TIME of day is your pain at its worst? evening Sleep (in general) Fair  Pain is worse with: bending and some activites Pain improves with: rest and medication Relief from Meds: 6  Family History  Problem Relation Age of Onset   Dementia Mother    Diabetes Mellitus I Mother    Hypertension Father        7   Healthy Sister    Rheum arthritis Brother    Cancer Neg Hx    Colon cancer Neg Hx    Esophageal cancer Neg Hx    Stomach cancer Neg Hx    Rectal cancer Neg Hx    Colon polyps Neg Hx    Social History   Socioeconomic History   Marital status: Married    Spouse name: Not on file   Number of  children: Not on file   Years of education: Not on file   Highest education level: Not on file  Occupational History   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     Highest level of education:  BS, business admin   Right handed   Two story home   Social Determinants of Health   Financial Resource Strain: Not on file  Food Insecurity: Not on file  Transportation Needs: Not on file  Physical Activity: Not on file  Stress: Not on file  Social Connections: Not on file   Past Surgical History:  Procedure Laterality Date   ANTERIOR CERVICAL DECOMP/DISCECTOMY FUSION N/A 02/01/2017   Procedure: ANTERIOR CERVICAL DECOMPRESSION/DISCECTOMY FUSION CERVICAL THREE-FOUR , CERVICAL FOUR-FIVE  CERVICAL FIVE-SIX;  Surgeon: Consuella Lose, MD;  Location: [facility];  Service: Neurosurgery;  Laterality: N/A;   APPENDECTOMY     BACK SURGERY     COLONOSCOPY  02/05/2010   [facility]   EYE SURGERY Right    Dr. Katy Fitch   KNEE SURGERY     2 Lknee arthroscopy, 3 R knee arthroscpoy   KNEE SURGERY Right 11/12/2016   LUMBAR FUSION  03/26/2020   LUMBAR LAMINECTOMY/DECOMPRESSION MICRODISCECTOMY Right 05/22/2019   Procedure: LAMINOTOMY AND MICRODISCECTOMY RIGHT LUMBAR FOUR- LUMBAR FIVE;  Surgeon: Consuella Lose, MD;  Location: [facility];  Service: Neurosurgery;  Laterality: Right;  LAMINOTOMY AND MICRODISCECTOMY RIGHT LUMBAR FOUR- LUMBAR FIVE   LUMBAR LAMINECTOMY/DECOMPRESSION MICRODISCECTOMY Right 02/01/2020   Procedure: REDO MICRODISCECTOMY RT LUMBAR FOUR-FIVE;  Surgeon: Consuella Lose, MD;  Location: [facility];  Service:  Neurosurgery;  Laterality: Right;  posterior   TONSILLECTOMY     Past Surgical History:  Procedure Laterality Date   ANTERIOR CERVICAL DECOMP/DISCECTOMY FUSION N/A 02/01/2017   Procedure: ANTERIOR CERVICAL DECOMPRESSION/DISCECTOMY FUSION CERVICAL THREE-FOUR , CERVICAL FOUR-FIVE  CERVICAL FIVE-SIX;  Surgeon: Consuella Lose, MD;  Location: [facility];  Service: Neurosurgery;  Laterality: N/A;   APPENDECTOMY     BACK SURGERY     COLONOSCOPY  02/05/2010   [facility]   EYE SURGERY Right    Dr. Katy Fitch   KNEE SURGERY     2 Lknee arthroscopy, 3 R knee arthroscpoy   KNEE SURGERY Right 11/12/2016   LUMBAR FUSION  03/26/2020   LUMBAR LAMINECTOMY/DECOMPRESSION MICRODISCECTOMY Right 05/22/2019   Procedure: LAMINOTOMY AND MICRODISCECTOMY RIGHT LUMBAR FOUR- LUMBAR FIVE;  Surgeon: Consuella Lose, MD;  Location: [facility];  Service: Neurosurgery;  Laterality: Right;  LAMINOTOMY AND MICRODISCECTOMY RIGHT LUMBAR FOUR- LUMBAR FIVE   LUMBAR LAMINECTOMY/DECOMPRESSION MICRODISCECTOMY Right 02/01/2020   Procedure: REDO MICRODISCECTOMY RT LUMBAR FOUR-FIVE;  Surgeon: Consuella Lose, MD;  Location: [facility];  Service: Neurosurgery;  Laterality: Right;  posterior   TONSILLECTOMY     Past Medical History:  Diagnosis Date   ADENOIDECTOMY, HX OF 02/16/2007   Anxiety    pt. states he does not have anxiety.   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[place])    diagnosed 11/2016   DIABETES MELLITUS, TYPE I, UNCONTROLLED 12/29/2006   ERECTILE DYSFUNCTION 02/16/2007   Hearing loss    wears hearing aids   HYPERLIPIDEMIA 02/16/2007   HYPERTENSION 02/16/2007   Nerve pain    per patient "on lower back and both legs"   Sleep apnea    uses CPAP   TESTICULAR MASS, LEFT 02/16/2007   Therapeutic opioid-induced constipation (OIC)    BP 132/77   Pulse 83   Temp 98.4 F (36.9 C)   Ht 1' (0.305 m)   Wt 226 lb 9.6 oz (102.8 kg)   SpO2 97%   BMI 1106.37  kg/m   Opioid Risk Score:   Fall Risk Score:  `1  Depression screen PHQ 2/9  Depression screen [facility] 2/9 10/01/2020 05/07/2020  Decreased Interest 0 0  Down, Depressed, Hopeless 0 0  PHQ - 2 Score 0 0  Some recent data might be hidden    Review of Systems  Musculoskeletal:  Positive for back pain. Negative for gait problem.  All other systems reviewed and are negative.     Objective:   Physical Exam Vitals and nursing note reviewed.  Constitutional:      Appearance: Normal appearance.  Cardiovascular:     Rate and Rhythm: Normal rate and regular rhythm.     Pulses: Normal  pulses.     Heart sounds: Normal heart sounds.  Pulmonary:     Effort: Pulmonary effort is normal.     Breath sounds: Normal breath sounds.  Musculoskeletal:     Cervical back: Normal range of motion and neck supple.     Comments: Normal Muscle Bulk and Muscle Testing Reveals: Upper Extremities: Full ROM and Muscle Strength  5/5 Lumbar Paraspinal Tenderness: L-3-L-5 Lower Extremities : Full ROM and Muscle Strength 5/5 Arises from Table Slowly using cane for support Antalgic Gait     Skin:    General: Skin is warm and dry.  Neurological:     Mental Status: He is alert and oriented to person, place, and time.  Psychiatric:        Mood and Affect: Mood normal.        Behavior: Behavior normal.         Assessment & Plan:  CIDP: Chronic Inflammatory Demyelinating Polyneuropathy: He is scheduled for IVIG: Neurology Following. Continue to monitor. Neurologic Gait Disorder: Continue use with assistive device at all times. He's using his cane today. 10/29/2020 3. Chronic Pain Syndrome: Continue Tramadol: Dose increased to twice a day as needed for pain. Call office or send a My-Chart message in a week to evaluate medication change. He verbalizes understanding. We will continue the opioid monitoring program, this consists of regular clinic visits, examinations, urine drug screen, pill counts as well as use  of [place] Controlled Substance Reporting system. A 12 month History has been reviewed on the [place] on 10/29/2020 Continue elavil continue to monitor.  4. Chronic Low Back Pain without Sciatica: Continue HEP as Tolerated . Continue to Monitor. 10/29/2020 5.Bilateral Knee Pain: No complaints today. Continue HEP as Tolerated. Continue to monitor. 10/29/2020   F/U in 6 months

## 2020-10-31 ENCOUNTER — Other Ambulatory Visit: Payer: Self-pay

## 2020-10-31 DIAGNOSIS — G6181 Chronic inflammatory demyelinating polyneuritis: Secondary | ICD-10-CM

## 2020-11-01 ENCOUNTER — Telehealth: Payer: Self-pay | Admitting: Neurology

## 2020-11-01 ENCOUNTER — Ambulatory Visit (HOSPITAL_COMMUNITY)
Admission: RE | Admit: 2020-11-01 | Discharge: 2020-11-01 | Disposition: A | Discharge status: Home / Self Care | Payer: Medicare Other | Source: Ambulatory Visit | Attending: Neurology | Admitting: Neurology

## 2020-11-01 DIAGNOSIS — G6181 Chronic inflammatory demyelinating polyneuritis: Secondary | ICD-10-CM | POA: Insufficient documentation

## 2020-11-01 MED ORDER — IMMUNE GLOBULIN (HUMAN) 10 GM/100ML IV SOLN
1.0000 g/kg | INTRAVENOUS | Status: DC
  Administered 2020-11-01: 105 g via INTRAVENOUS
  Filled 2020-11-01: qty 50

## 2020-11-01 NOTE — Telephone Encounter (Signed)
Ordered placed, will confirm with infusion center.

## 2020-11-01 NOTE — Telephone Encounter (Signed)
Called patient back and he stated he needs new IVIG prescriptions for his infusions. Patient stated he has infusions every 6 weeks.

## 2020-11-05 ENCOUNTER — Ambulatory Visit: Payer: Medicare Other | Admitting: Endocrinology

## 2020-11-05 ENCOUNTER — Telehealth: Payer: Self-pay

## 2020-11-05 MED ORDER — TRAMADOL HCL 50 MG PO TABS: 50.0000 mg | ORAL_TABLET | Freq: Two times a day (BID) | ORAL | 3 refills | Status: DC | PRN

## 2020-11-05 NOTE — Telephone Encounter (Signed)
Wife called stating that patient Tramadol was changed to twice daily. Doing well on it and needs refill.

## 2020-11-05 NOTE — Telephone Encounter (Signed)
PMP was Reviewed.  Tramadol e-scribed today.  

## 2020-11-14 ENCOUNTER — Ambulatory Visit: Payer: Medicare Other | Admitting: Endocrinology

## 2020-12-06 ENCOUNTER — Encounter: Payer: Self-pay | Admitting: Endocrinology

## 2020-12-06 ENCOUNTER — Other Ambulatory Visit: Payer: Self-pay

## 2020-12-06 ENCOUNTER — Ambulatory Visit (INDEPENDENT_AMBULATORY_CARE_PROVIDER_SITE_OTHER): Payer: Medicare Other | Admitting: Endocrinology

## 2020-12-06 VITALS — BP 140/84 | HR 85 | Wt 225.2 lb

## 2020-12-06 DIAGNOSIS — E1042 Type 1 diabetes mellitus with diabetic polyneuropathy: Secondary | ICD-10-CM

## 2020-12-06 LAB — POCT GLYCOSYLATED HEMOGLOBIN (HGB A1C): Hemoglobin A1C: 7.9 % — AB (ref 4.0–5.6)

## 2020-12-06 NOTE — Patient Instructions (Addendum)
Please take these pump settings:  basal rate of 3 units/hr 6 AM-10 PM, and 2.6 units/hr 10 PM-6 AM (when not in auto mode). Mealtime bolus of 1 unit/5 grams cardohydrate correction bolus (which some people call "sensitivity," or "insulin sensitivity ratio," or just "isr") of 1 unit for each 50 by which your glucose exceeds 100.   suspend the pump for 1-2 hrs, with activity.  If not, eat a light snack with it.   On this type of insulin schedule, you should eat meals on a regular schedule.  If a meal is missed or significantly delayed, your blood sugar could go low.   Please call medtronic about the problem of leaving auto mode.  Please come back for a follow-up appointment in 2-3 months.

## 2020-12-06 NOTE — Progress Notes (Signed)
   Subjective:    Patient ID: Kenneth Pace, male    DOB: 05/23/1950, 70 y.o.   MRN: [id number]  HPI Pt returns for f/u of diabetes mellitus:  DM type: 1 Dx'ed: 3570 Complications: DR and PN.   Therapy: insulin since dx.  DKA: only at dx.   Severe hypoglycemia: most recently in 2014.  Pancreatitis: never SDOH: he is retired Other: he has a [employer] worker 670 pump and continuous glucose monitor; he is retired. continuous glucose monitor ID is [email or web address] and password is [email or web address].   Interval history: he takes these pump settings:  basal rate of 3 units/hr 6 AM-10 PM, and 2.6 units/hr 10 PM-6 AM (when not in auto mode). Mealtime bolus of 1 unit/6 grams cardohydrate.  correction bolus (which some people call "sensitivity," or "insulin sensitivity ratio," or just "isr") of 1 unit for each 50 by which your glucose exceeds 100.   suspend the pump for 1-2 hrs, with activity.  If not, eat a light snack with it.   TDD is 92 units (70% basal).  He takes 2.1 meal boluses per day.    I reviewed continuous glucose monitor data.  Glucose varies from 40-360.  It decreases overnight.  It is in general highest at [place], and 9-10PM  He has hypoglycemia approx once per week.  This happens fasting, or with exertion.   Pt says pump is exiting auto mode if he does not frequently check cbg.  pt states he feels well in general.  Wife says cho counting is not good, but CDE visit did not help.    Review of Systems     Objective:   Physical Exam Pulses: dorsalis pedis intact bilat.   MSK: no deformity of the feet CV: 2+ bilat leg edema Skin:  no ulcer on the feet, but the skin is dry.  normal color and temp on the feet.   Neuro: sensation is intact to touch on the feet, but decreased from normal.   Ext: there is bilateral onychomycosis of the toenails.   Lab Results  Component Value Date   HGBA1C 7.9 (A) 12/06/2020      Assessment & Plan:  Type 1 DM: uncontrolled Hypoglycemia, due to  insulin: this limits aggressiveness of glycemic control.  Patient Instructions  Please take these pump settings:  basal rate of 3 units/hr 6 AM-10 PM, and 2.6 units/hr 10 PM-6 AM (when not in auto mode). Mealtime bolus of 1 unit/5 grams cardohydrate correction bolus (which some people call "sensitivity," or "insulin sensitivity ratio," or just "isr") of 1 unit for each 50 by which your glucose exceeds 100.   suspend the pump for 1-2 hrs, with activity.  If not, eat a light snack with it.   On this type of insulin schedule, you should eat meals on a regular schedule.  If a meal is missed or significantly delayed, your blood sugar could go low.   Please call medtronic about the problem of leaving auto mode.  Please come back for a follow-up appointment in 2-3 months.

## 2020-12-08 ENCOUNTER — Other Ambulatory Visit: Payer: Self-pay | Admitting: Family Medicine

## 2020-12-13 ENCOUNTER — Ambulatory Visit (HOSPITAL_COMMUNITY)
Admission: RE | Admit: 2020-12-13 | Discharge: 2020-12-13 | Disposition: A | Discharge status: Home / Self Care | Payer: Medicare Other | Source: Ambulatory Visit | Attending: Rheumatology | Admitting: Rheumatology

## 2020-12-13 DIAGNOSIS — G6181 Chronic inflammatory demyelinating polyneuritis: Secondary | ICD-10-CM | POA: Insufficient documentation

## 2020-12-13 MED ORDER — IMMUNE GLOBULIN (HUMAN) 5 GM/50ML IV SOLN
INTRAVENOUS | Status: DC
  Filled 2020-12-13: qty 800
  Filled 2020-12-13: qty 1000

## 2020-12-30 ENCOUNTER — Other Ambulatory Visit: Payer: Self-pay | Admitting: Family Medicine

## 2021-01-03 ENCOUNTER — Encounter: Payer: Self-pay | Admitting: Neurology

## 2021-01-24 ENCOUNTER — Ambulatory Visit (HOSPITAL_COMMUNITY)
Admission: RE | Admit: 2021-01-24 | Discharge: 2021-01-24 | Disposition: A | Discharge status: Home / Self Care | Payer: Medicare Other | Source: Ambulatory Visit | Attending: Rheumatology | Admitting: Rheumatology

## 2021-01-24 DIAGNOSIS — G6181 Chronic inflammatory demyelinating polyneuritis: Secondary | ICD-10-CM | POA: Diagnosis present

## 2021-01-24 MED ORDER — IMMUNE GLOBULIN (HUMAN) 10 GM/100ML IV SOLN
1.0000 g/kg | INTRAVENOUS | Status: DC
  Administered 2021-01-24: 105 g via INTRAVENOUS
  Filled 2021-01-24: qty 800

## 2021-02-14 ENCOUNTER — Ambulatory Visit: Payer: Medicare Other | Admitting: Neurology

## 2021-02-25 NOTE — Progress Notes (Signed)
Follow-up Visit   Date: 02/26/21   Kenneth Pace MRN: [id number] DOB: 11-Mar-1950   Interim History: Kenneth Pace is a 71 y.o. right-handed male with insulin-dependent diabetes mellitus, hypertension, cervical myelopathy s/p decompression at C3-C6, and s/p L4 laminectomy (05/2019), and s/p L4-5 PLIF (01/2020) returning to the clinic for follow-up of CIDP  The patient was accompanied to the clinic by wife.    From a neurological standpoint, he is stable. He continues to get IVIG every 6 weeks which helps. No new numbness/tingling or weakness.  His primary issue right now is severe left knee arthritis. He will be having left knee surgery on 2/14.  He ambulates with a cane but continues to have falls because his left knee gives out.    Medications:  Current Outpatient Medications on File Prior to Visit  Medication Sig Dispense Refill   acetaminophen (TYLENOL) 500 MG tablet Take 500 mg by mouth every 6 (six) hours as needed for mild pain.     amitriptyline (ELAVIL) 75 MG tablet TAKE 1 TABLET BY MOUTH AT  BEDTIME 90 tablet 3   B Complex Vitamins (B-COMPLEX/B-12 PO) Take 1 tablet by mouth at bedtime.     celecoxib (CELEBREX) 100 MG capsule TAKE 1 CAPSULE BY MOUTH AT  BEDTIME 90 capsule 3   clotrimazole-betamethasone (LOTRISONE) cream APPLY EXTERNALLY TO THE AFFECTED AREA TWICE DAILY 90 g 2   Cranberry 400 MG TABS Take 400 mg by mouth at bedtime.     DULoxetine (CYMBALTA) 60 MG capsule TAKE 1 CAPSULE BY MOUTH  DAILY 90 capsule 3   finasteride (PROSCAR) 5 MG tablet TAKE 1 TABLET BY MOUTH AT  BEDTIME 90 tablet 3   glucagon 1 MG injection Inject 1 mg into the vein once as needed. 1 each 12   glucose blood (BAYER CONTOUR NEXT TEST) test strip MEDICALLY NECESSARY FOR USE WITH PUMP; Use to check blood sugar 5 times per day and prn; E11.42 500 each 3   Immune Globulin 10% (IMMUNE GLOBULIN 10%) 10G/172mL (10,000mg /167mL) SOLN Inject 105 g into the vein every 6 (six) weeks. 289.5 mL 10    Insulin Human (INSULIN PUMP) SOLN Inject 120 each into the skin daily. HUMALOG     Insulin Infusion Pump Supplies (PARADIGM RESERVOIR 3ML) MISC 1 Device by Does not apply route every 3 (three) days. 30 each 3   Insulin Infusion Pump Supplies (QUICK-SET INFUSION 43" 9MM) MISC 1 Device by Does not apply route every 3 (three) days. 30 each 3   insulin lispro (HUMALOG) 100 UNIT/ML injection Inject into the skin.     lidocaine (LIDODERM) 5 % At 7 am and remove at 7 pm. Apply 1 on each side. 180 patch 2   lisinopril-hydrochlorothiazide (ZESTORETIC) 10-12.5 MG tablet Take 1 tablet by mouth at bedtime. 90 tablet 3   methocarbamol (ROBAXIN) 750 MG tablet TAKE 1 TABLET BY MOUTH 3  TIMES DAILY AS NEEDED FOR  MUSCLE SPASM(S) 90 tablet 1   Multiple Vitamin (MULTIVITAMIN) capsule Take 1 capsule by mouth at bedtime.     oxyCODONE-acetaminophen (PERCOCET/ROXICET) 5-325 MG tablet Take 1 tablet by mouth every 6 (six) hours as needed.     prochlorperazine (COMPAZINE) 5 MG tablet Take by mouth.     pseudoephedrine (SUDAFED) 30 MG tablet Take 30 mg by mouth at bedtime as needed for congestion.     scopolamine (TRANSDERM-SCOP) 1 MG/3DAYS Place 1 patch (1.5 mg total) onto the skin every 3 (three) days. 10 patch 1   traMADol (  ULTRAM) 50 MG tablet Take 1 tablet (50 mg total) by mouth 2 (two) times daily as needed. 60 tablet 3   No current facility-administered medications on file prior to visit.    Allergies: No Known Allergies    Vital Signs:  BP (!) 142/81    Pulse 86    Ht 6' (1.829 m)    Wt 229 lb (103.9 kg)    SpO2 100%    BMI 31.06 kg/m   Neurological Exam: MENTAL STATUS including orientation to time, place, person, recent and remote memory, attention span and concentration, language, and fund of knowledge is normal.  Speech is not dysarthric.  CRANIAL NERVES: Normal conjugate, extra-ocular eye movements in all directions of gaze.  No ptosis. Face is symmetric.  MOTOR:  There is moderate right FDI and ADM  atrophy.  No fasciculations or abnormal movements.  No pronator drift.    Right Upper Extremity:    Left Upper Extremity:    Deltoid  5/5   Deltoid  5/5   Biceps  5/5   Biceps  5/5   Triceps  5/5   Triceps  5/5   Wrist extensors  5/5   Wrist extensors  5/5   Wrist flexors  5/5   Wrist flexors  5/5   Finger extensors  5/5   Finger extensors  5/5   Finger flexors  5/5   Finger flexors  5/5   Dorsal interossei  5/5   Dorsal interossei  5/5   Abductor pollicis  5/5   Abductor pollicis  5/5   Tone (Ashworth scale)  0  Tone (Ashworth scale)  0   Right Lower Extremity:    Left Lower Extremity:    Hip flexors  5-/5   Hip flexors  5-/5   Hip extensors  5/5   Hip extensors  5/5   Knee flexors  5/5   Knee flexors  5/5   Knee extensors  5/5   Knee extensors  5/5   Dorsiflexors  5/5   Dorsiflexors  5/5   Plantarflexors  5/5   Plantarflexors  5/5   Toe extensors  5/5   Toe extensors  5/5   Toe flexors  5/5   Toe flexors  5/5   Tone (Ashworth scale)  0  Tone (Ashworth scale)  0    MSRs:  Right                                                                 Left brachioradialis 2+  brachioradialis 2+  biceps 2+  biceps 2+  triceps 2+  triceps 2+  patellar 2+  patellar 2+  ankle jerk 2+  ankle jerk 2+   SENSORY:  Vibration 100% at the knees and MCP.    COORDINATION/GAIT: Gait is slow, unassisted, spastic appearing.   Data: MRI cervical and lumbar spine 11/20/2016: 1. No focal cord signal abnormality or lesion. 2. Multilevel spondylosis of the cervical spine as described. 3. Moderate central and mild bilateral foraminal stenosis at C3-4. 4. Moderate central and bilateral foraminal narrowing at C4-5. 5. Moderate central and severe bilateral foraminal stenosis at C5-6. 6. Moderate foraminal narrowing bilaterally at C6-7 without significant central canal stenosis. 7. Short pedicles in the cervical and lumbar spine contribute to the stenosis.  8. Disc bulging at L2-3 and L3-4 without  significant stenosis. 9. Disc bulging and facet hypertrophy at L4-5 with mild subarticular and foraminal stenosis bilaterally. 10. Mild left foraminal narrowing at L5-S1 secondary to asymmetric facet hypertrophy.  MRI lumbar spine wo contrast 05/07/2019:  1. New large right paracentral L4-5 disc extrusion with cranial extension within the subarticular recess above the L4-5 level and impinging the right L4 nerve root. 2. Mild progression of lumbar spondylosis at L3-L4 where there is mild left foraminal stenosis at L3-4. 3. Mild to moderate bilateral foraminal stenosis at L4-L5. 4. No canal stenosis at any level.  CSF 11/20/2016:  R1 W3 P81* G102*  No OCB, VDRL neg Labs 11/20/2016:  SPEP with IFE no M protein, 486, TSH 1.051  Lab Results  Component Value Date   HGBA1C 7.9 (A) 12/06/2020   MRI cervical spine 12/31/2016:   1. No enlargement of the cranial nerves or cervical nerve roots. 2. No acute intracranial abnormality. 3. Multilevel moderate cervical spinal canal stenosis, worst at C3-4 and C5-6 with associated mild cord deformity but no cord signal change. 4. Moderate to severe neural foraminal stenosis at C3-4, C4-5 and C5-6.  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  When compared to his previous study on 11/10/2016, there is mild interval improvement.  Chronic C6 radiculopathy affecting the right upper extremity, mild in degree electrically.  There is no evidence of a sensorimotor polyneuropathy or lumbosacral radiculopathy affecting the right lower extremity.    IMPRESSION/PLAN: 1.  Chronic inflammatory demyelinating polyradiculoneuropathy, diagnosed 2018.  His neuropathy has been stable and well-controlled on IVIG.    - Continue IVIG 1g/kg every 6 weeks  - Consider adding azathioprine or cellcept in the fall  2.  Low back pain  s/p L4-5 PLIF with redo in 03/2020 by Dr. Kathyrn Sheriff, stable  3.  Cervical myelopathy s/p decompression at C3-6 with spastic gait  -Followed by PM&R  Return to clinic in 9 months  Total time spent reviewing records, interview, history/exam, documentation, and coordination of care on day of encounter:  20 min  Thank you for allowing me to participate in patient's care.  If I can answer any additional questions, I would be pleased to do so.    Sincerely,    Fredric Slabach K. Posey Pronto, DO

## 2021-02-26 ENCOUNTER — Encounter: Payer: Self-pay | Admitting: Neurology

## 2021-02-26 ENCOUNTER — Ambulatory Visit (INDEPENDENT_AMBULATORY_CARE_PROVIDER_SITE_OTHER): Payer: Medicare Other | Admitting: Neurology

## 2021-02-26 ENCOUNTER — Other Ambulatory Visit: Payer: Self-pay

## 2021-02-26 VITALS — BP 142/81 | HR 86 | Ht 72.0 in | Wt 229.0 lb

## 2021-02-26 DIAGNOSIS — R261 Paralytic gait: Secondary | ICD-10-CM

## 2021-02-26 DIAGNOSIS — G959 Disease of spinal cord, unspecified: Secondary | ICD-10-CM | POA: Diagnosis not present

## 2021-02-26 DIAGNOSIS — G6181 Chronic inflammatory demyelinating polyneuritis: Secondary | ICD-10-CM

## 2021-02-26 NOTE — Patient Instructions (Signed)
Continue IVIG every 6 weeks  Return to clinic in 9 months

## 2021-03-04 ENCOUNTER — Other Ambulatory Visit: Payer: Self-pay | Admitting: Family Medicine

## 2021-03-06 ENCOUNTER — Other Ambulatory Visit: Payer: Self-pay

## 2021-03-07 ENCOUNTER — Ambulatory Visit (HOSPITAL_COMMUNITY)
Admission: RE | Admit: 2021-03-07 | Discharge: 2021-03-07 | Disposition: A | Discharge status: Home / Self Care | Payer: Medicare Other | Source: Ambulatory Visit | Attending: Rheumatology | Admitting: Rheumatology

## 2021-03-07 ENCOUNTER — Other Ambulatory Visit: Payer: Self-pay

## 2021-03-07 DIAGNOSIS — G6181 Chronic inflammatory demyelinating polyneuritis: Secondary | ICD-10-CM

## 2021-03-07 MED ORDER — IMMUNE GLOBULIN (HUMAN) 10 GM/100ML IV SOLN
1.0000 g/kg | INTRAVENOUS | Status: DC
  Administered 2021-03-07: 105 g via INTRAVENOUS
  Filled 2021-03-07: qty 800

## 2021-03-13 ENCOUNTER — Ambulatory Visit (INDEPENDENT_AMBULATORY_CARE_PROVIDER_SITE_OTHER): Payer: Medicare Other | Admitting: Endocrinology

## 2021-03-13 ENCOUNTER — Other Ambulatory Visit: Payer: Self-pay

## 2021-03-13 ENCOUNTER — Encounter: Payer: Self-pay | Admitting: Endocrinology

## 2021-03-13 VITALS — BP 120/74 | HR 83 | Ht 72.0 in | Wt 225.6 lb

## 2021-03-13 DIAGNOSIS — E1042 Type 1 diabetes mellitus with diabetic polyneuropathy: Secondary | ICD-10-CM | POA: Diagnosis not present

## 2021-03-13 LAB — POCT GLYCOSYLATED HEMOGLOBIN (HGB A1C): Hemoglobin A1C: 7.4 % — AB (ref 4.0–5.6)

## 2021-03-13 NOTE — Progress Notes (Signed)
Subjective:    Patient ID: Kenneth Pace, male    DOB: 05-Aug-1950, 71 y.o.   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   Therapy: insulin since dx.  DKA: only at dx.   Severe hypoglycemia: most recently in 2014.  Pancreatitis: never SDOH: he is retired Other: he has a [employer] worker 670 pump and continuous glucose monitor; he is retired. continuous glucose monitor ID is [email or web address] and password is [email or web address]; Wife says cho counting is not good, but CDE visit did not help.  Interval history: he takes these pump settings:  basal rate of 3 units/hr 6 AM-10 PM, and 2.6 units/hr 10 PM-6 AM (when not in auto mode). Mealtime bolus of 1 unit/5 grams cardohydrate correction bolus (which some people call "sensitivity," or "insulin sensitivity ratio," or just "isr") of 1 unit for each 50 by which your glucose exceeds 100.   suspend the pump for 1-2 hrs, with activity.  TDD is 84 units (75% basal).  He takes 2.6 meal boluses per day.    I reviewed continuous glucose monitor data.  Glucose varies from 50-330.  It decreases slightly overnight.  It is in general highest at [place], and 9PM.  He has hypoglycemia approx once per week.  This happens fasting, or with exertion.   pt states he feels well in general.   Past Medical History:  Diagnosis Date   ADENOIDECTOMY, HX OF 02/16/2007   Anxiety    pt. states he does not have anxiety.   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[place])    diagnosed 11/2016   DIABETES MELLITUS, TYPE I, UNCONTROLLED 12/29/2006   ERECTILE DYSFUNCTION 02/16/2007   Hearing loss    wears hearing aids   HYPERLIPIDEMIA 02/16/2007   HYPERTENSION 02/16/2007   Nerve pain    per patient "on lower back and both legs"   Sleep apnea    uses CPAP   TESTICULAR MASS, LEFT 02/16/2007   Therapeutic opioid-induced constipation (OIC)      Past Surgical History:  Procedure Laterality Date   ANTERIOR CERVICAL DECOMP/DISCECTOMY FUSION N/A 02/01/2017   Procedure: ANTERIOR CERVICAL DECOMPRESSION/DISCECTOMY FUSION CERVICAL THREE-FOUR , CERVICAL FOUR-FIVE  CERVICAL FIVE-SIX;  Surgeon: Consuella Lose, MD;  Location: [facility];  Service: Neurosurgery;  Laterality: N/A;   APPENDECTOMY     BACK SURGERY     COLONOSCOPY  02/05/2010   [facility]   EYE SURGERY Right    Dr. Katy Fitch   KNEE SURGERY     2 Lknee arthroscopy, 3 R knee arthroscpoy   KNEE SURGERY Right 11/12/2016   LUMBAR FUSION  03/26/2020   LUMBAR LAMINECTOMY/DECOMPRESSION MICRODISCECTOMY Right 05/22/2019   Procedure: LAMINOTOMY AND MICRODISCECTOMY RIGHT LUMBAR FOUR- LUMBAR FIVE;  Surgeon: Consuella Lose, MD;  Location: [facility];  Service: Neurosurgery;  Laterality: Right;  LAMINOTOMY AND MICRODISCECTOMY RIGHT LUMBAR FOUR- LUMBAR FIVE   LUMBAR LAMINECTOMY/DECOMPRESSION MICRODISCECTOMY Right 02/01/2020   Procedure: REDO MICRODISCECTOMY RT LUMBAR FOUR-FIVE;  Surgeon: Consuella Lose, MD;  Location: [facility];  Service: Neurosurgery;  Laterality: Right;  posterior   TONSILLECTOMY      Social History   Socioeconomic History   Marital status: Married    Spouse name: Not on file   Number of children: Not on file   Years of education: Not on file   Highest education level: Not on file  Occupational History   Occupation: ENGINEER    Employer: [employer]    Comment: [employer]  Tobacco Use   Smoking status: Never   Smokeless tobacco: Never  Vaping Use   Vaping Use: Never used  Substance and Sexual Activity   Alcohol use: No   Drug use: No   Sexual activity: Not Currently  Other Topics Concern   Not on file  Social History Narrative   He works for [employer]   He lives at home with wife.     Highest level of education:  BS, business admin   Right handed   Two story home   Social Determinants of Health   Financial Resource  Strain: Not on file  Food Insecurity: Not on file  Transportation Needs: Not on file  Physical Activity: Not on file  Stress: Not on file  Social Connections: Not on file  Intimate Partner Violence: Not on file    Current Outpatient Medications on File Prior to Visit  Medication Sig Dispense Refill   acetaminophen (TYLENOL) 500 MG tablet Take 500 mg by mouth every 6 (six) hours as needed for mild pain.     amitriptyline (ELAVIL) 75 MG tablet TAKE 1 TABLET BY MOUTH AT  BEDTIME 90 tablet 3   B Complex Vitamins (B-COMPLEX/B-12 PO) Take 1 tablet by mouth at bedtime.     celecoxib (CELEBREX) 100 MG capsule TAKE 1 CAPSULE BY MOUTH AT  BEDTIME 90 capsule 3   clotrimazole-betamethasone (LOTRISONE) cream APPLY EXTERNALLY TO THE AFFECTED AREA TWICE DAILY 90 g 2   Cranberry 400 MG TABS Take 400 mg by mouth at bedtime.     DULoxetine (CYMBALTA) 60 MG capsule TAKE 1 CAPSULE BY MOUTH  DAILY 90 capsule 3   finasteride (PROSCAR) 5 MG tablet TAKE 1 TABLET BY MOUTH AT  BEDTIME 90 tablet 3   glucagon 1 MG injection Inject 1 mg into the vein once as needed. 1 each 12   glucose blood (BAYER CONTOUR NEXT TEST) test strip MEDICALLY NECESSARY FOR USE WITH PUMP; Use to check blood sugar 5 times per day and prn; E11.42 500 each 3   Immune Globulin 10% (IMMUNE GLOBULIN 10%) 10G/180mL (10,000mg /16mL) SOLN Inject 105 g into the vein every 6 (six) weeks. 289.5 mL 10   Insulin Human (INSULIN PUMP) SOLN Inject 120 each into the skin daily. HUMALOG     Insulin Infusion Pump Supplies (PARADIGM RESERVOIR 3ML) MISC 1 Device by Does not apply route every 3 (three) days. 30 each 3   Insulin Infusion Pump Supplies (QUICK-SET INFUSION 43" 9MM) MISC 1 Device by Does not apply route every 3 (three) days. 30 each 3   insulin lispro (HUMALOG) 100 UNIT/ML injection Inject into the skin.     lidocaine (LIDODERM) 5 % At 7 am and remove at 7 pm. Apply 1 on each side. 180 patch 2   lisinopril-hydrochlorothiazide (ZESTORETIC) 10-12.5 MG  tablet TAKE 1 TABLET BY MOUTH AT  BEDTIME 90 tablet 3   methocarbamol (ROBAXIN) 750 MG tablet TAKE 1 TABLET BY MOUTH 3  TIMES DAILY AS NEEDED FOR  MUSCLE SPASM(S) 90 tablet 1   Multiple Vitamin (MULTIVITAMIN) capsule Take 1 capsule by mouth at bedtime.     oxyCODONE-acetaminophen (PERCOCET/ROXICET) 5-325 MG tablet Take 1 tablet by mouth every 6 (six) hours as needed.     prochlorperazine (COMPAZINE) 5 MG tablet Take by mouth.     pseudoephedrine (SUDAFED) 30 MG tablet Take 30 mg by mouth at bedtime as needed for congestion.  scopolamine (TRANSDERM-SCOP) 1 MG/3DAYS Place 1 patch (1.5 mg total) onto the skin every 3 (three) days. 10 patch 1   traMADol (ULTRAM) 50 MG tablet Take 1 tablet (50 mg total) by mouth 2 (two) times daily as needed. 60 tablet 3   No current facility-administered medications on file prior to visit.    No Known Allergies  Family History  Problem Relation Age of Onset   Dementia Mother    Diabetes Mellitus I Mother    Hypertension Father        38   Healthy Sister    Rheum arthritis Brother    Cancer Neg Hx    Colon cancer Neg Hx    Esophageal cancer Neg Hx    Stomach cancer Neg Hx    Rectal cancer Neg Hx    Colon polyps Neg Hx     BP 120/74    Pulse 83    Ht 6' (1.829 m)    Wt 225 lb 9.6 oz (102.3 kg)    SpO2 95%    BMI 30.60 kg/m    Review of Systems He has mild hypoglycemia approx 2/month.    Objective:   Physical Exam    Lab Results  Component Value Date   HGBA1C 7.4 (A) 03/13/2021      Assessment & Plan:  Type 1 DM: uncontrolled.  Hypoglycemia, due to insulin: this limits aggressiveness of glycemic control.   Patient Instructions  Please take these pump settings:  basal rate of 3 units/hr 6 AM-10 PM, and 2.6 units/hr 10 PM-6 AM (when not in auto mode). Mealtime bolus of 1 unit/4 grams carbohydrate.   correction bolus (which some people call "sensitivity," or "insulin sensitivity ratio," or just "isr") of 1 unit for each 50 by which  your glucose exceeds 100.   suspend the pump for 1-2 hrs, with activity.  If not, eat a light snack with it.   On this type of insulin schedule, you should eat meals on a regular schedule.  If a meal is missed or significantly delayed, your blood sugar could go low.   Please call [employer], or ask Vaughan Basta, about the problem of leaving auto mode.   Please come back for a follow-up appointment in 2-3 months.

## 2021-03-13 NOTE — Patient Instructions (Addendum)
Please take these pump settings:  basal rate of 3 units/hr 6 AM-10 PM, and 2.6 units/hr 10 PM-6 AM (when not in auto mode). Mealtime bolus of 1 unit/4 grams carbohydrate.   correction bolus (which some people call "sensitivity," or "insulin sensitivity ratio," or just "isr") of 1 unit for each 50 by which your glucose exceeds 100.   suspend the pump for 1-2 hrs, with activity.  If not, eat a light snack with it.   On this type of insulin schedule, you should eat meals on a regular schedule.  If a meal is missed or significantly delayed, your blood sugar could go low.   Please call [employer], or ask Vaughan Basta, about the problem of leaving auto mode.   Please come back for a follow-up appointment in 2-3 months.

## 2021-03-31 ENCOUNTER — Telehealth: Payer: Self-pay

## 2021-03-31 NOTE — Telephone Encounter (Signed)
Papers from [facility] will be placed up from waiting for pt to pick up and pt has been notified.

## 2021-04-02 ENCOUNTER — Ambulatory Visit: Payer: Medicare Other | Admitting: Family Medicine

## 2021-04-18 ENCOUNTER — Telehealth: Payer: Self-pay | Admitting: Family Medicine

## 2021-04-18 ENCOUNTER — Other Ambulatory Visit: Payer: Self-pay

## 2021-04-18 ENCOUNTER — Ambulatory Visit (HOSPITAL_COMMUNITY)
Admission: RE | Admit: 2021-04-18 | Discharge: 2021-04-18 | Disposition: A | Discharge status: Home / Self Care | Payer: Medicare Other | Source: Ambulatory Visit | Attending: Rheumatology | Admitting: Rheumatology

## 2021-04-18 DIAGNOSIS — G6181 Chronic inflammatory demyelinating polyneuritis: Secondary | ICD-10-CM | POA: Insufficient documentation

## 2021-04-18 MED ORDER — IMMUNE GLOBULIN (HUMAN) 10 GM/100ML IV SOLN
1.0000 g/kg | INTRAVENOUS | Status: DC
  Administered 2021-04-18: 105 g via INTRAVENOUS
  Filled 2021-04-18 (×2): qty 1050

## 2021-04-18 NOTE — Telephone Encounter (Signed)
Appt scheduled for Monday 04/21/21 at 8:30 AM. ?

## 2021-04-18 NOTE — Telephone Encounter (Signed)
Pt's wife wanted to confirm we have fax regarding surgical clearance from [facility]. Placed in s-drive and printed and placed int Dr.Wendling's box.  ?

## 2021-04-21 ENCOUNTER — Encounter: Payer: Self-pay | Admitting: Family Medicine

## 2021-04-21 ENCOUNTER — Ambulatory Visit (INDEPENDENT_AMBULATORY_CARE_PROVIDER_SITE_OTHER): Payer: Medicare Other | Admitting: Family Medicine

## 2021-04-21 VITALS — BP 132/82 | HR 80 | Temp 98.0°F | Ht 72.0 in | Wt 224.0 lb

## 2021-04-21 DIAGNOSIS — Z01818 Encounter for other preprocedural examination: Secondary | ICD-10-CM | POA: Diagnosis not present

## 2021-04-21 DIAGNOSIS — I10 Essential (primary) hypertension: Secondary | ICD-10-CM

## 2021-04-21 LAB — CBC
HCT: 44.8 % (ref 39.0–52.0)
Hemoglobin: 15.3 g/dL (ref 13.0–17.0)
MCHC: 34 g/dL (ref 30.0–36.0)
MCV: 88.2 fl (ref 78.0–100.0)
Platelets: 146 10*3/uL — ABNORMAL LOW (ref 150.0–400.0)
RBC: 5.08 Mil/uL (ref 4.22–5.81)
RDW: 13.8 % (ref 11.5–15.5)
WBC: 2.6 10*3/uL — ABNORMAL LOW (ref 4.0–10.5)

## 2021-04-21 LAB — BASIC METABOLIC PANEL
BUN: 23 mg/dL (ref 6–23)
CO2: 28 mEq/L (ref 19–32)
Calcium: 8.2 mg/dL — ABNORMAL LOW (ref 8.4–10.5)
Chloride: 96 mEq/L (ref 96–112)
Creatinine, Ser: 0.8 mg/dL (ref 0.40–1.50)
GFR: 90.8 mL/min (ref >60.00)
Glucose, Bld: 227 mg/dL — ABNORMAL HIGH (ref 70–99)
Potassium: 3.7 mEq/L (ref 3.5–5.1)
Sodium: 133 mEq/L — ABNORMAL LOW (ref 135–145)

## 2021-04-21 MED ORDER — CLOTRIMAZOLE-BETAMETHASONE 1-0.05 % EX CREA: 1.0000 "application " | TOPICAL_CREAM | Freq: Two times a day (BID) | CUTANEOUS | 2 refills | Status: DC | PRN

## 2021-04-21 NOTE — Patient Instructions (Signed)
Give us 2-3 business days to get the results of your labs back.   Keep the diet clean and stay active.  Let us know if you need anything. 

## 2021-04-21 NOTE — Progress Notes (Signed)
Subjective:   Chief Complaint  Patient presents with   Medical Clearance    Kenneth Pace  is here for a Pre-operative physical at the request of Dr. Alvan Dame.   He  is having L total knee surgery on 05/06/21 for severe left knee osteoarthritis.  Personal or family hx of adverse outcome to anesthesia? No  Chipped, cracked, missing, or loose teeth? No  Decreased ROM of neck? No  Able to walk up 2 flights of stairs without becoming significantly short of breath or having chest pain? No   Hypertension Patient presents for hypertension follow up. He does not monitor home blood pressures. He is compliant with medications- Zestoretic 10-12.5 mg/d Patient has these side effects of medication: none He is adhering to a healthy diet overall. Exercise: some walking, limited due to   Patient Active Problem List   Diagnosis Date Noted   Lumbar herniated disc 02/01/2020   Lumbar radiculopathy 05/22/2019   Spastic gait 03/07/2019   Myalgia 11/01/2018   Chronic bilateral low back pain without sciatica 07/21/2018   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   CIDP (chronic inflammatory demyelinating polyneuropathy) ([place]) 12/30/2017   Edema 03/01/2017   Hypokalemia    Slow transit constipation    Steroid-induced hyperglycemia    Urinary retention    Accidental drug overdose    Postoperative pain    Hyponatremia    Cervical myelopathy ([place]) 02/01/2017   Shortness of breath at rest    Hypoglycemia    Spondylosis, cervical, with myelopathy    Neuropathic pain    Benign essential HTN    Labile blood glucose    Muscle spasm    GAD (generalized anxiety disorder)    Acute inflammatory demyelinating polyneuropathy ([place]) 01/11/2017   Anxiety state    Neurogenic bladder    Depression 12/30/2016   Leg weakness, bilateral 12/30/2016   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[place]) 12/30/2016   GBS (Guillain Barre syndrome) ([place])    AIDP (acute inflammatory demyelinating polyneuropathy) ([place])  11/27/2016   Weakness 11/20/2016   Weakness of both arms 10/30/2016   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   Obstructive sleep apnea 03/24/2016   Numbness 03/18/2016   Mild nonproliferative diabetic retinopathy of both eyes without macular edema associated with type 2 diabetes mellitus ([place]) 05/24/2015   Nuclear cataract of both eyes 05/24/2015   Diabetes mellitus without complication ([place]) 03/50/0938   Chronic prostatitis 03/01/2015   Eustachian tube dysfunction 02/12/2015   Acute maxillary sinusitis 02/12/2015   Wellness examination 08/22/2014   Alkaline phosphatase elevation 08/22/2014   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   Benign prostatic hyperplasia with urinary frequency 01/31/2014   Lesion of eyelid 09/26/2013   Screening for prostate cancer 04/24/2013   Diabetes ([place]) 06/16/2012   ED (erectile dysfunction) of organic origin 06/03/2012   Routine general medical examination at a health care facility 04/10/2012   BPH (benign prostatic hyperplasia) 02/03/2010   HEMOCCULT POSITIVE STOOL 05/25/2008   ELBOW PAIN, RIGHT 07/04/2007   Dyslipidemia 02/16/2007   ANXIETY 02/16/2007   ERECTILE DYSFUNCTION 02/16/2007   Essential hypertension 02/16/2007   TESTICULAR MASS, LEFT 02/16/2007   Chronic pain of left knee 02/16/2007   ADENOIDECTOMY, HX OF 02/16/2007   Past Medical History:  Diagnosis Date   ADENOIDECTOMY, HX OF 02/16/2007   Anxiety    pt. states he does not have anxiety.   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[place])    diagnosed 11/2016   DIABETES MELLITUS,  TYPE I, UNCONTROLLED 12/29/2006   ERECTILE DYSFUNCTION 02/16/2007   Hearing loss    wears hearing aids   HYPERLIPIDEMIA 02/16/2007   HYPERTENSION 02/16/2007   Nerve pain    per patient "on lower back and both legs"   Sleep apnea    uses CPAP   TESTICULAR MASS, LEFT 02/16/2007   Therapeutic opioid-induced  constipation (OIC)     Past Surgical History:  Procedure Laterality Date   ANTERIOR CERVICAL DECOMP/DISCECTOMY FUSION N/A 02/01/2017   Procedure: ANTERIOR CERVICAL DECOMPRESSION/DISCECTOMY FUSION CERVICAL THREE-FOUR , CERVICAL FOUR-FIVE  CERVICAL FIVE-SIX;  Surgeon: Consuella Lose, MD;  Location: [facility];  Service: Neurosurgery;  Laterality: N/A;   APPENDECTOMY     BACK SURGERY     COLONOSCOPY  02/05/2010   [facility]   EYE SURGERY Right    Dr. Katy Fitch   KNEE SURGERY     2 Lknee arthroscopy, 3 R knee arthroscpoy   KNEE SURGERY Right 11/12/2016   LUMBAR FUSION  03/26/2020   LUMBAR LAMINECTOMY/DECOMPRESSION MICRODISCECTOMY Right 05/22/2019   Procedure: LAMINOTOMY AND MICRODISCECTOMY RIGHT LUMBAR FOUR- LUMBAR FIVE;  Surgeon: Consuella Lose, MD;  Location: [facility];  Service: Neurosurgery;  Laterality: Right;  LAMINOTOMY AND MICRODISCECTOMY RIGHT LUMBAR FOUR- LUMBAR FIVE   LUMBAR LAMINECTOMY/DECOMPRESSION MICRODISCECTOMY Right 02/01/2020   Procedure: REDO MICRODISCECTOMY RT LUMBAR FOUR-FIVE;  Surgeon: Consuella Lose, MD;  Location: [facility];  Service: Neurosurgery;  Laterality: Right;  posterior   TONSILLECTOMY      Current Outpatient Medications  Medication Sig Dispense Refill   acetaminophen (TYLENOL) 500 MG tablet Take 500 mg by mouth every 6 (six) hours as needed for mild pain.     amitriptyline (ELAVIL) 75 MG tablet TAKE 1 TABLET BY MOUTH AT  BEDTIME 90 tablet 3   B Complex Vitamins (B-COMPLEX/B-12 PO) Take 1 tablet by mouth at bedtime.     Cranberry 400 MG TABS Take 400 mg by mouth at bedtime.     DULoxetine (CYMBALTA) 60 MG capsule TAKE 1 CAPSULE BY MOUTH  DAILY (Patient taking differently: Take 60 mg by mouth at bedtime.) 90 capsule 3   finasteride (PROSCAR) 5 MG tablet TAKE 1 TABLET BY MOUTH AT  BEDTIME 90 tablet 3   glucagon 1 MG injection Inject 1 mg into the vein once as needed. 1 each 12   glucose blood (BAYER CONTOUR NEXT TEST) test strip MEDICALLY NECESSARY FOR USE WITH PUMP;  Use to check blood sugar 5 times per day and prn; E11.42 500 each 3   Immune Globulin 10% (IMMUNE GLOBULIN 10%) 10G/159m (10,'000mg'$ /104m SOLN Inject 105 g into the vein every 6 (six) weeks. 289.5 mL 10   Insulin Human (INSULIN PUMP) SOLN Inject 120 each into the skin daily. HUMALOG     Insulin Infusion Pump Supplies (PARADIGM RESERVOIR 3ML) MISC 1 Device by Does not apply route every 3 (three) days. 30 each 3   Insulin Infusion Pump Supplies (QUICK-SET INFUSION 43" 9MM) MISC 1 Device by Does not apply route every 3 (three) days. 30 each 3   insulin lispro (HUMALOG) 100 UNIT/ML injection Inject into the skin. Via insulin pump     lidocaine (LIDODERM) 5 % At 7 am and remove at 7 pm. Apply 1 on each side. 180 patch 2   lisinopril-hydrochlorothiazide (ZESTORETIC) 10-12.5 MG tablet TAKE 1 TABLET BY MOUTH AT  BEDTIME 90 tablet 3   methocarbamol (ROBAXIN) 750 MG tablet TAKE 1 TABLET BY MOUTH 3  TIMES DAILY AS NEEDED FOR  MUSCLE SPASM(S) 90 tablet 1  Multiple Vitamin (MULTIVITAMIN WITH MINERALS) TABS tablet Take 1 tablet by mouth at bedtime.     prochlorperazine (COMPAZINE) 5 MG tablet Take 5 mg by mouth every 6 (six) hours as needed for vomiting or nausea.     pseudoephedrine (SUDAFED) 30 MG tablet Take 30 mg by mouth at bedtime as needed for congestion.     scopolamine (TRANSDERM-SCOP) 1 MG/3DAYS Place 1 patch (1.5 mg total) onto the skin every 3 (three) days. (Patient taking differently: Place 1 patch onto the skin every 3 (three) days as needed (nausea with motion sickness (cruise)).) 10 patch 1   traMADol (ULTRAM) 50 MG tablet Take 1 tablet (50 mg total) by mouth 2 (two) times daily as needed. 60 tablet 3   clotrimazole-betamethasone (LOTRISONE) cream Apply 1 application topically 2 (two) times daily as needed (apply to feet skin irritation). 45 g 2   No Known Allergies  Family History  Problem Relation Age of Onset   Dementia Mother    Diabetes Mellitus I Mother    Hypertension Father         36   Healthy Sister    Rheum arthritis Brother    Cancer Neg Hx    Colon cancer Neg Hx    Esophageal cancer Neg Hx    Stomach cancer Neg Hx    Rectal cancer Neg Hx    Colon polyps Neg Hx      Review of Systems:  Constitutional:  no fevers Eye:  no recent significant change in vision Ear:  no hearing loss Nose/Mouth/Throat:  No dental complaints Neck/Thyroid:  no lumps or masses Pulmonary:  No shortness of breath Cardiovascular:  no chest pain Gastrointestinal:  no abdominal pain GU:  negative for dysuria Musculoskeletal/Extremities:  +L knee pain Skin/Integumentary ROS:  no abnormal skin lesions reported Neurologic:  no HA   Objective:   Vitals:   04/21/21 0809  BP: 132/82  Pulse: 80  Temp: 98 F (36.7 C)  TempSrc: Oral  SpO2: 94%  Weight: 224 lb (101.6 kg)  Height: 6' (1.829 m)   Body mass index is 30.38 kg/m.  General:  well developed, well nourished, in no apparent distress Skin:  warm, no pallor or diaphoresis Head:  normocephalic, atraumatic Eyes:  sclera anicteric without injection Ears: No external lesions Throat/Pharynx:  lips and gingiva without lesion; tongue and uvula midline; non-inflamed pharynx; no exudates or postnasal drainage Neck: neck supple without adenopathy, thyromegaly, or masses, no bruits, no jugular venous distention Lungs:  clear to auscultation, breath sounds equal bilaterally, no respiratory distress Cardio:  regular rate and rhythm without lower extremity edema Musculoskeletal: No obvious deformity or atrophy, normal active range of motion of the neck Extremities:  no clubbing, cyanosis, or edema, no deformities, no skin discoloration Neuro:  gait antalgic; no cerebellar signs Psych: Age appropriate judgment and insight; normal mood   Assessment:   Preop examination - Plan: CBC, Basic metabolic panel  Essential hypertension   Plan:   The above laboratory work was ordered and will be sent with this physical. Pending the  above workup, the patient is deemed low cardiac risk for the proposed procedure.  This is irregardless of whether they do spinal anesthesia versus general.  A1c 7.4 through his endocrinologist on 03/13/21. Chronic, stable.  Continue Zestoretic 10-12.5 mg daily.  Counseled on diet and exercise. Follow-up in 6 months for med check or as needed. The patient and his spouse voiced understanding and agreement to the plan.  [place], DO 04/21/21  8:49 AM

## 2021-04-22 NOTE — Patient Instructions (Addendum)
DUE TO COVID-19 ONLY ONE VISITOR  (aged 69 and older)  IS ALLOWED TO COME WITH YOU AND STAY IN THE WAITING ROOM ONLY DURING PRE OP AND PROCEDURE.   **NO VISITORS ARE ALLOWED IN THE SHORT STAY AREA OR RECOVERY ROOM!!**  IF YOU WILL BE ADMITTED INTO THE HOSPITAL YOU ARE ALLOWED ONLY TWO SUPPORT PEOPLE DURING VISITATION HOURS ONLY (7 AM -8PM)   The support person(s) must pass our screening, gel in and out, and wear a mask at all times, including in the patients room. Patients must also wear a mask when staff or their support person are in the room. Visitors GUEST BADGE MUST BE WORN VISIBLY  One adult visitor may remain with you overnight and MUST be in the room by 8 P.M.     COVID SWAB TESTING MUST BE COMPLETED ON:  05/02/21    (*ARRIVE AT YOUR APPOINTMENT TIME STAFF IS NOT HERE BEFORE 8AM!!!*)    Site: [facility] [address] [place] Enter: Main Entrance have a seat in the waiting area to the right of main entrance (DO NOT [place]!!!!!) Dial: [phone] to alert staff you have arrived  You are not required to quarantine, however you are required to wear a well-fitted mask when you are out and around people not in your household.  Hand Hygiene often Do NOT share personal items Notify your provider if you are in close contact with someone who has COVID or you develop fever 100.4 or greater, new onset of sneezing, cough, sore throat, shortness of breath or body aches.  [address], [address], must go inside of the hospital, NOT A DRIVE THRU!  (Must self quarantine after testing. Follow instructions on handout.)       Your procedure is scheduled on: 05/06/21   Report to [facility] Main Entrance    Report to admitting at : 10:15 AM   Call this number if you have problems the morning of surgery [phone]   Do not eat food :After Midnight.   After Midnight you may have the  following liquids until : 10:00 AM  DAY OF SURGERY  Water Black Coffee (sugar ok, NO MILK/CREAM OR CREAMERS)  Tea (sugar ok, NO MILK/CREAM OR CREAMERS) regular and decaf                             Plain Jell-O (NO RED)                                           Fruit ices (not with fruit pulp, NO RED)                                     Popsicles (NO RED)                                                                  Juice: apple, WHITE grape, WHITE cranberry Sports drinks like Gatorade (NO RED) Clear broth(vegetable,chicken,beef)  Drink Gatorade drink AT :10:00 AM the day of surgery.      The day of surgery:  Drink ONE (1) Pre-Surgery Clear Ensure or G2 at AM the morning of surgery. Drink in one sitting. Do not sip.  This drink was given to you during your hospital  pre-op appointment visit. Nothing else to drink after completing the  Pre-Surgery Clear Ensure or G2.          If you have questions, please contact your surgeons office.    Oral Hygiene is also important to reduce your risk of infection.                                    Remember - BRUSH YOUR TEETH THE MORNING OF SURGERY WITH YOUR REGULAR TOOTHPASTE   Do NOT smoke after Midnight   Take these medicines the morning of surgery with A SIP OF WATER: N/A  How to Manage Your Diabetes Before and After Surgery  Why is it important to control my blood sugar before and after surgery? Improving blood sugar levels before and after surgery helps healing and can limit problems. A way of improving blood sugar control is eating a healthy diet by:  Eating less sugar and carbohydrates  Increasing activity/exercise  Talking with your doctor about reaching your blood sugar goals High blood sugars (greater than 180 mg/dL) can raise your risk of infections and slow your recovery, so you will need to focus on controlling your diabetes during the weeks before surgery. Make sure that the doctor who takes care of your  diabetes knows about your planned surgery including the date and location.  How do I manage my blood sugar before surgery? Check your blood sugar at least 4 times a day, starting 2 days before surgery, to make sure that the level is not too high or low. Check your blood sugar the morning of your surgery when you wake up and every 2 hours until you get to the Short Stay unit. If your blood sugar is less than 70 mg/dL, you will need to treat for low blood sugar: Do not take insulin. Treat a low blood sugar (less than 70 mg/dL) with  cup of clear juice (cranberry or apple), 4 glucose tablets, OR glucose gel. Recheck blood sugar in 15 minutes after treatment (to make sure it is greater than 70 mg/dL). If your blood sugar is not greater than 70 mg/dL on recheck, call [phone] for further instructions. Report your blood sugar to the short stay nurse when you get to Short Stay.  If you are admitted to the hospital after surgery: Your blood sugar will be checked by the staff and you will probably be given insulin after surgery (instead of oral diabetes medicines) to make sure you have good blood sugar levels. The goal for blood sugar control after surgery is 80-180 mg/dL.   WHAT DO I DO ABOUT MY DIABETES MEDICATION?    For patients with insulin pumps: Contact your diabetes doctor for specific instructions before surgery. Decrease basal rates by 20% at midnight the night before your surgery. Note that if your surgery is planned to be longer than 2 hours, your insulin pump will be removed and intravenous (IV) insulin will be started and managed by the nurses and the anesthesiologist. You will be able to restart your insulin pump once you are awake and able to manage it.  Make sure to  bring insulin pump supplies to the hospital with you in case the  site needs to be changed.  Patient Signature:  Date:   Nurse Signature:  Date:   DO NOT TAKE ANY ORAL DIABETIC MEDICATIONS DAY OF YOUR  SURGERY  Bring CPAP mask and tubing day of surgery.                              You may not have any metal on your body including hair pins, jewelry, and body piercing             Do not wear lotions, powders, perfumes/cologne, or deodorant              Men may shave face and neck.   Do not bring valuables to the hospital. [place].   Contacts, dentures or bridgework may not be worn into surgery.   Bring small overnight bag day of surgery.    Patients discharged on the day of surgery will not be allowed to drive home.  Someone NEEDS to stay with you for the first 24 hours after anesthesia.   Special Instructions: Bring a copy of your healthcare power of attorney and living will documents         the day of surgery if you haven't scanned them before.              Please read over the following fact sheets you were given: IF YOU HAVE QUESTIONS ABOUT YOUR PRE-OP INSTRUCTIONS PLEASE CALL [phone]     [facility] - Preparing for Surgery Before surgery, you can play an important role.  Because skin is not sterile, your skin needs to be as free of germs as possible.  You can reduce the number of germs on your skin by washing with CHG (chlorahexidine gluconate) soap before surgery.  CHG is an antiseptic cleaner which kills germs and bonds with the skin to continue killing germs even after washing. Please DO NOT use if you have an allergy to CHG or antibacterial soaps.  If your skin becomes reddened/irritated stop using the CHG and inform your nurse when you arrive at Short Stay. Do not shave (including legs and underarms) for at least 48 hours prior to the first CHG shower.  You may shave your face/neck. Please follow these instructions carefully:  1.  Shower with CHG Soap the night before surgery and the  morning of Surgery.  2.  If you choose to wash your hair, wash your hair first as usual with your  normal  shampoo.  3.  After you  shampoo, rinse your hair and body thoroughly to remove the  shampoo.                           4.  Use CHG as you would any other liquid soap.  You can apply chg directly  to the skin and wash                       Gently with a scrungie or clean washcloth.  5.  Apply the CHG Soap to your body ONLY FROM THE NECK DOWN.   Do not use on face/ open  Wound or open sores. Avoid contact with eyes, ears mouth and genitals (private parts).                       Wash face,  Genitals (private parts) with your normal soap.             6.  Wash thoroughly, paying special attention to the area where your surgery  will be performed.  7.  Thoroughly rinse your body with warm water from the neck down.  8.  DO NOT shower/wash with your normal soap after using and rinsing off  the CHG Soap.                9.  Pat yourself dry with a clean towel.            10.  Wear clean pajamas.            11.  Place clean sheets on your bed the night of your first shower and do not  sleep with pets. Day of Surgery : Do not apply any lotions/deodorants the morning of surgery.  Please wear clean clothes to the hospital/surgery center.  FAILURE TO FOLLOW THESE INSTRUCTIONS MAY RESULT IN THE CANCELLATION OF YOUR SURGERY PATIENT SIGNATURE_________________________________  NURSE SIGNATURE__________________________________  ________________________________________________________________________   Adam Phenix  An incentive spirometer is a tool that can help keep your lungs clear and active. This tool measures how well you are filling your lungs with each breath. Taking long deep breaths may help reverse or decrease the chance of developing breathing (pulmonary) problems (especially infection) following: A long period of time when you are unable to move or be active. BEFORE THE PROCEDURE  If the spirometer includes an indicator to show your best effort, your nurse or respiratory therapist will set  it to a desired goal. If possible, sit up straight or lean slightly forward. Try not to slouch. Hold the incentive spirometer in an upright position. INSTRUCTIONS FOR USE  Sit on the edge of your bed if possible, or sit up as far as you can in bed or on a chair. Hold the incentive spirometer in an upright position. Breathe out normally. Place the mouthpiece in your mouth and seal your lips tightly around it. Breathe in slowly and as deeply as possible, raising the piston or the ball toward the top of the column. Hold your breath for 3-5 seconds or for as long as possible. Allow the piston or ball to fall to the bottom of the column. Remove the mouthpiece from your mouth and breathe out normally. Rest for a few seconds and repeat Steps 1 through 7 at least 10 times every 1-2 hours when you are awake. Take your time and take a few normal breaths between deep breaths. The spirometer may include an indicator to show your best effort. Use the indicator as a goal to work toward during each repetition. After each set of 10 deep breaths, practice coughing to be sure your lungs are clear. If you have an incision (the cut made at the time of surgery), support your incision when coughing by placing a pillow or rolled up towels firmly against it. Once you are able to get out of bed, walk around indoors and cough well. You may stop using the incentive spirometer when instructed by your caregiver.  RISKS AND COMPLICATIONS Take your time so you do not get dizzy or light-headed. If you are in pain, you may need to take or ask  for pain medication before doing incentive spirometry. It is harder to take a deep breath if you are having pain. AFTER USE Rest and breathe slowly and easily. It can be helpful to keep track of a log of your progress. Your caregiver can provide you with a simple table to help with this. If you are using the spirometer at home, follow these instructions: [place] IF:  You are  having difficultly using the spirometer. You have trouble using the spirometer as often as instructed. Your pain medication is not giving enough relief while using the spirometer. You develop fever of 100.5 F (38.1 C) or higher. SEEK IMMEDIATE MEDICAL CARE IF:  You cough up bloody sputum that had not been present before. You develop fever of 102 F (38.9 C) or greater. You develop worsening pain at or near the incision site. MAKE SURE YOU:  Understand these instructions. Will watch your condition. Will get help right away if you are not doing well or get worse. Document Released: 06/15/2006 Document Revised: 04/27/2011 Document Reviewed: 08/16/2006 [facility] Patient Information 2014 [place], [place].   ________________________________________________________________________

## 2021-04-23 ENCOUNTER — Encounter (HOSPITAL_COMMUNITY): Payer: Self-pay

## 2021-04-23 ENCOUNTER — Other Ambulatory Visit: Payer: Self-pay

## 2021-04-23 ENCOUNTER — Encounter (HOSPITAL_COMMUNITY)
Admission: RE | Admit: 2021-04-23 | Discharge: 2021-04-23 | Disposition: A | Discharge status: Home / Self Care | Payer: Medicare Other | Source: Ambulatory Visit | Attending: Orthopedic Surgery | Admitting: Orthopedic Surgery

## 2021-04-23 VITALS — BP 131/74 | HR 74 | Temp 97.6°F | Ht 72.0 in | Wt 218.0 lb

## 2021-04-23 DIAGNOSIS — E119 Type 2 diabetes mellitus without complications: Secondary | ICD-10-CM | POA: Diagnosis not present

## 2021-04-23 DIAGNOSIS — M1712 Unilateral primary osteoarthritis, left knee: Secondary | ICD-10-CM | POA: Diagnosis not present

## 2021-04-23 DIAGNOSIS — Z01818 Encounter for other preprocedural examination: Secondary | ICD-10-CM | POA: Insufficient documentation

## 2021-04-23 LAB — COMPREHENSIVE METABOLIC PANEL
ALT: 21 U/L (ref 0–44)
AST: 27 U/L (ref 15–41)
Albumin: 3.5 g/dL (ref 3.5–5.0)
Alkaline Phosphatase: 168 U/L — ABNORMAL HIGH (ref 38–126)
Anion gap: 5 (ref 5–15)
BUN: 19 mg/dL (ref 8–23)
CO2: 26 mmol/L (ref 22–32)
Calcium: 8.3 mg/dL — ABNORMAL LOW (ref 8.9–10.3)
Chloride: 104 mmol/L (ref 98–111)
Creatinine, Ser: 0.73 mg/dL (ref 0.61–1.24)
GFR, Estimated: >60 mL/min (ref >60)
Glucose, Bld: 77 mg/dL (ref 70–99)
Potassium: 3.3 mmol/L — ABNORMAL LOW (ref 3.5–5.1)
Sodium: 135 mmol/L (ref 135–145)
Total Bilirubin: 0.6 mg/dL (ref 0.3–1.2)
Total Protein: 8.2 g/dL — ABNORMAL HIGH (ref 6.5–8.1)

## 2021-04-23 LAB — SURGICAL PCR SCREEN
MRSA, PCR: NEGATIVE
Staphylococcus aureus: NEGATIVE

## 2021-04-23 LAB — TYPE AND SCREEN
ABO/RH(D): O POS
Antibody Screen: NEGATIVE

## 2021-04-23 LAB — GLUCOSE, CAPILLARY: Glucose-Capillary: 72 mg/dL (ref 70–99)

## 2021-04-23 NOTE — Progress Notes (Signed)
Insulin pump contract was explained and signed by pt. And RN. [place] policies and procedures were explained to pt. And his wife. Diabetes coordinator was notified about this pt. ?

## 2021-04-23 NOTE — Progress Notes (Signed)
For Short Stay: ?COVID SWAB appointment date: 05/02/21 @ 8:15 AM ?Date of COVID positive in last 90 days: N/A ?COVID Vaccine: Moderna x 5: 01/08/21 ?Bowel Prep reminder: N/A ? ? ?For Anesthesia: ?PCP - DO: Ames Coupe. LOV: 04/21/21 Clearance: 04/21/21: Epic,Chart ?Cardiologist -  ? ?Chest x-ray -  ?EKG -  ?Stress Test -  ?ECHO -  ?Cardiac Cath -  ?Pacemaker/ICD device last checked: ?Pacemaker orders received: ?Device Rep notified: ? ?Spinal Cord Stimulator: ? ?Sleep Study - Yes ?CPAP - Yes ? ?Fasting Blood Sugar - 100's ?Checks Blood Sugar ___5__ times a day ?Date and result of last Hgb A1c- 03/13/21: 7.4 ? ?Blood Thinner Instructions: ?Aspirin Instructions: ?Last Dose: ? ?Activity level: Can go up a flight of stairs and activities of daily living without stopping and without chest pain and/or shortness of breath ?  Able to exercise without chest pain and/or shortness of breath ?  Unable to go up a flight of stairs without chest pain and/or shortness of breath ?   ? ?Anesthesia review: Hx: HTN,DIA type 1,OSA(CPAP) ? ?Patient denies shortness of breath, fever, cough and chest pain at PAT appointment ? ? ?Patient verbalized understanding of instructions that were given to them at the PAT appointment. Patient was also instructed that they will need to review over the PAT instructions again at home before surgery.  ?

## 2021-04-29 ENCOUNTER — Other Ambulatory Visit: Payer: Self-pay

## 2021-04-29 ENCOUNTER — Encounter: Payer: Self-pay | Admitting: Registered Nurse

## 2021-04-29 ENCOUNTER — Encounter: Payer: Medicare Other | Attending: Registered Nurse | Admitting: Registered Nurse

## 2021-04-29 VITALS — BP 152/78 | HR 78 | Ht 72.0 in | Wt 230.2 lb

## 2021-04-29 DIAGNOSIS — Z79899 Other long term (current) drug therapy: Secondary | ICD-10-CM | POA: Diagnosis present

## 2021-04-29 DIAGNOSIS — G8929 Other chronic pain: Secondary | ICD-10-CM | POA: Diagnosis present

## 2021-04-29 DIAGNOSIS — R269 Unspecified abnormalities of gait and mobility: Secondary | ICD-10-CM | POA: Insufficient documentation

## 2021-04-29 DIAGNOSIS — M25562 Pain in left knee: Secondary | ICD-10-CM | POA: Insufficient documentation

## 2021-04-29 DIAGNOSIS — Z5181 Encounter for therapeutic drug level monitoring: Secondary | ICD-10-CM | POA: Insufficient documentation

## 2021-04-29 DIAGNOSIS — M545 Low back pain, unspecified: Secondary | ICD-10-CM | POA: Diagnosis not present

## 2021-04-29 DIAGNOSIS — M25561 Pain in right knee: Secondary | ICD-10-CM | POA: Diagnosis present

## 2021-04-29 DIAGNOSIS — G6181 Chronic inflammatory demyelinating polyneuritis: Secondary | ICD-10-CM | POA: Insufficient documentation

## 2021-04-29 DIAGNOSIS — G894 Chronic pain syndrome: Secondary | ICD-10-CM | POA: Diagnosis not present

## 2021-04-29 MED ORDER — TRAMADOL HCL 50 MG PO TABS: 50.0000 mg | ORAL_TABLET | Freq: Two times a day (BID) | ORAL | 5 refills | Status: DC | PRN

## 2021-04-29 NOTE — Progress Notes (Signed)
? ?Subjective:  ? ? Patient ID: Kenneth Pace, male    DOB: [birth year], 71 y.o.   MRN: [id number] ? ?HPI: Kenneth Pace is a 71 y.o. male who returns for follow up appointment for chronic pain and medication refill. He states his pain is located in his lower back and bilateral lower extremities L>R. He rates his pain 7. His current exercise regime is walking and performing stretching exercises. ? ?Mr. Coppa is scheduled for See Below: with Dr Alvan Dame on 05/06/2021 ?TOTAL KNEE ARTHROPLASTY Left  ? ? Mr. Mccready Morphine equivalent is 6.33 MME.   Last UDS was Performed on 06/22/20222, it was consistent.  ? ?Pain Inventory ?Average Pain 6 ?Pain Right Now 7 ?My pain is constant, sharp, and dull ? ?In the last 24 hours, has pain interfered with the following? ?General activity 6 ?Relation with others 3 ?Enjoyment of life 3 ?What TIME of day is your pain at its worst? daytime ?Sleep (in general) Fair ? ?Pain is worse with: walking ?Pain improves with: rest ?Relief from Meds: 3 ? ?Family History  ?Problem Relation Age of Onset  ? Dementia Mother   ? Diabetes Mellitus I Mother   ? Hypertension Father   ?     50  ? Healthy Sister   ? Rheum arthritis Brother   ? Cancer Neg Hx   ? Colon cancer Neg Hx   ? Esophageal cancer Neg Hx   ? Stomach cancer Neg Hx   ? Rectal cancer Neg Hx   ? Colon polyps Neg Hx   ? ?Social History  ? ?Socioeconomic History  ? Marital status: Married  ?  Spouse name: Not on file  ? Number of children: Not on file  ? Years of education: Not on file  ? Highest education level: Not on file  ?Occupational History  ? Occupation: ENGINEER  ?  Employer: [employer]  ?  Comment: [employer]  ?Tobacco Use  ? Smoking status: Never  ? Smokeless tobacco: Never  ?Vaping Use  ? Vaping Use: Never used  ?Substance and Sexual Activity  ? Alcohol use: No  ? Drug use: No  ? Sexual activity: Not Currently  ?Other Topics Concern  ? Not on file  ?Social History Narrative  ? He works for [employer]  ? He lives at home with wife.    ? Highest level of education:  BS, business admin  ? Right handed  ? Two story home  ? ?Social Determinants of Health  ? ?Financial Resource Strain: Not on file  ?Food Insecurity: Not on file  ?Transportation Needs: Not on file  ?Physical Activity: Not on file  ?Stress: Not on file  ?Social Connections: Not on file  ? ?Past Surgical History:  ?Procedure Laterality Date  ? ANTERIOR CERVICAL DECOMP/DISCECTOMY FUSION N/A 02/01/2017  ? Procedure: ANTERIOR CERVICAL DECOMPRESSION/DISCECTOMY FUSION CERVICAL THREE-FOUR , CERVICAL FOUR-FIVE  CERVICAL FIVE-SIX;  Surgeon: Consuella Lose, MD;  Location: [facility];  Service: Neurosurgery;  Laterality: N/A;  ? APPENDECTOMY    ? BACK SURGERY    ? COLONOSCOPY  02/05/2010  ? [facility]  ? EYE SURGERY Right   ? Dr. Katy Fitch  ? KNEE SURGERY    ? 2 Lknee arthroscopy, 3 R knee arthroscpoy  ? KNEE SURGERY Right 11/12/2016  ? LUMBAR FUSION  03/26/2020  ? LUMBAR LAMINECTOMY/DECOMPRESSION MICRODISCECTOMY Right 05/22/2019  ? Procedure: LAMINOTOMY AND MICRODISCECTOMY RIGHT LUMBAR FOUR- LUMBAR FIVE;  Surgeon: Consuella Lose, MD;  Location: [facility];  Service: Neurosurgery;  Laterality: Right;  LAMINOTOMY AND MICRODISCECTOMY RIGHT LUMBAR FOUR- LUMBAR FIVE  ? LUMBAR LAMINECTOMY/DECOMPRESSION MICRODISCECTOMY Right 02/01/2020  ? Procedure: REDO MICRODISCECTOMY RT LUMBAR FOUR-FIVE;  Surgeon: Consuella Lose, MD;  Location: [facility];  Service: Neurosurgery;  Laterality: Right;  posterior  ? TONSILLECTOMY    ? ?Past Surgical History:  ?Procedure Laterality Date  ? ANTERIOR CERVICAL DECOMP/DISCECTOMY FUSION N/A 02/01/2017  ? Procedure: ANTERIOR CERVICAL DECOMPRESSION/DISCECTOMY FUSION CERVICAL THREE-FOUR , CERVICAL FOUR-FIVE  CERVICAL FIVE-SIX;  Surgeon: Consuella Lose, MD;  Location: [facility];  Service: Neurosurgery;  Laterality: N/A;  ? APPENDECTOMY    ? BACK SURGERY    ? COLONOSCOPY  02/05/2010  ? [facility]  ? EYE SURGERY Right   ? Dr. Katy Fitch  ?  KNEE SURGERY    ? 2 Lknee arthroscopy, 3 R knee arthroscpoy  ? KNEE SURGERY Right 11/12/2016  ? LUMBAR FUSION  03/26/2020  ? LUMBAR LAMINECTOMY/DECOMPRESSION MICRODISCECTOMY Right 05/22/2019  ? Procedure: LAMINOTOMY AND MICRODISCECTOMY RIGHT LUMBAR FOUR- LUMBAR FIVE;  Surgeon: Consuella Lose, MD;  Location: [facility];  Service: Neurosurgery;  Laterality: Right;  LAMINOTOMY AND MICRODISCECTOMY RIGHT LUMBAR FOUR- LUMBAR FIVE  ? LUMBAR LAMINECTOMY/DECOMPRESSION MICRODISCECTOMY Right 02/01/2020  ? Procedure: REDO MICRODISCECTOMY RT LUMBAR FOUR-FIVE;  Surgeon: Consuella Lose, MD;  Location: [facility] [place];  Service: Neurosurgery;  Laterality: Right;  posterior  ? TONSILLECTOMY    ? ?Past Medical History:  ?Diagnosis Date  ? ADENOIDECTOMY, HX OF 02/16/2007  ? Anxiety   ? pt. states he does not have anxiety.  ? BENIGN PROSTATIC HYPERTROPHY, WITH OBSTRUCTION 02/03/2010  ? Chronic inflammatory demyelinating polyneuropathy (HCC)   ? diagnosed 11/2016  ? DIABETES MELLITUS, TYPE I, UNCONTROLLED 12/29/2006  ? ERECTILE DYSFUNCTION 02/16/2007  ? Hearing loss   ? wears hearing aids  ? HYPERLIPIDEMIA 02/16/2007  ? HYPERTENSION 02/16/2007  ? Nerve pain   ? per patient "on lower back and both legs"  ? Sleep apnea   ? uses CPAP  ? TESTICULAR MASS, LEFT 02/16/2007  ? Therapeutic opioid-induced constipation (OIC)   ? ?BP (!) 152/78   Pulse 78   Ht 6' (1.829 m)   Wt 230 lb 3.2 oz (104.4 kg)   SpO2 98%   BMI 31.22 kg/m?  ? ?Opioid Risk Score:   ?Fall Risk Score:  `1 ? ?Depression screen PHQ 2/9 ? ?Depression screen [facility] 2/9 04/29/2021 10/29/2020 10/01/2020 05/07/2020  ?Decreased Interest 0 0 0 0  ?Down, Depressed, Hopeless 0 0 0 0  ?PHQ - 2 Score 0 0 0 0  ?Some recent data might be hidden  ?  ? ?Review of Systems  ?Constitutional: Negative.   ?HENT: Negative.    ?Eyes: Negative.   ?Respiratory: Negative.    ?Cardiovascular: Negative.   ?Gastrointestinal: Negative.   ?Endocrine: Negative.   ?Genitourinary: Negative.   ?Musculoskeletal:  Positive  for gait problem.  ?Skin: Negative.   ?Allergic/Immunologic: Negative.   ?Hematological: Negative.   ?Psychiatric/Behavioral: Negative.    ? ?   ?Objective:  ? Physical Exam ?Vitals and nursing note reviewed.  ?Constitutional:   ?   Appearance: Normal appearance.  ?Cardiovascular:  ?   Rate and Rhythm: Normal rate and regular rhythm.  ?   Pulses: Normal pulses.  ?   Heart sounds: Normal heart sounds.  ?Pulmonary:  ?   Effort: Pulmonary effort is normal.  ?   Breath sounds: Normal breath sounds.  ?Musculoskeletal:  ?   Cervical back: Normal range of motion and neck supple.  ?  Comments: Normal Muscle Bulk and Muscle Testing Reveals:  ?Upper Extremities: Full ROM and Muscle Strength 5/5 ?Lumbar Paraspinal Tenderness: L-4-L-5 ?Lower Extremities: Full ROM and Muscle Strength 5/5 ?Arises from Table slowly using cane for support ?Antalgic  Gait  ?   ?Skin: ?   General: Skin is warm and dry.  ?Neurological:  ?   Mental Status: He is alert and oriented to person, place, and time.  ?Psychiatric:     ?   Mood and Affect: Mood normal.     ?   Behavior: Behavior normal.  ? ? ? ? ?   ?Assessment & Plan:  ?CIDP: Chronic Inflammatory Demyelinating Polyneuropathy: He is scheduled for IVIG: Neurology Following. 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 He's using his cane today. 04/29/2021 ?3. Chronic Pain Syndrome: Refilled:Tramadol: 50 mg one tablet twice a day as needed for pain #60.Marland Kitchen We will continue the opioid monitoring program, this consists of regular clinic visits, examinations, urine drug screen, pill counts as well as use of [place] Controlled Substance Reporting system. A 12 month History has been reviewed on the [place] Controlled Substance Reporting System on 04/29/2020 ?Continue elavil continue to monitor.  ?4. Chronic Low Back Pain without Sciatica: Continue HEP as Tolerated . Continue to Monitor. 04/29/2021 ?5.Bilateral Knee Pain: L>R:  Scheduled for .see below on 05/06/2021 ?TOTAL KNEE ARTHROPLASTY Left  ? Continue HEP as Tolerated. Continue to monitor. 04/29/2021 ?  ?F/U in 6 months  ? ?

## 2021-05-01 NOTE — H&P (Signed)
TOTAL KNEE ADMISSION H&P ? ?Patient is being admitted for left total knee arthroplasty. ? ?Subjective: ? ?Chief Complaint:left knee pain. ? ?HPI: Kenneth Pace, 71 y.o. male, has a history of pain and functional disability in the left knee due to arthritis and has failed non-surgical conservative treatments for greater than 12 weeks to includeNSAID's and/or analgesics, corticosteriod injections, and activity modification.  Onset of symptoms was gradual, starting  several  years ago with gradually worsening course since that time. The patient noted prior procedures on the knee to include  arthroscopy and menisectomy on the left knee(s).  Patient currently rates pain in the left knee(s) at 8 out of 10 with activity. Patient has worsening of pain with activity and weight bearing, pain that interferes with activities of daily living, and pain with passive range of motion.  Patient has evidence of joint space narrowing by imaging studies.  There is no active infection. ? ?Patient Active Problem List  ? Diagnosis Date Noted  ? Lumbar herniated disc 02/01/2020  ? Lumbar radiculopathy 05/22/2019  ? Spastic gait 03/07/2019  ? Myalgia 11/01/2018  ? Chronic bilateral low back pain without sciatica 07/21/2018  ? 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 Edema 03/01/2017  ? Hypokalemia   ? Slow transit constipation   ? Steroid-induced hyperglycemia   ? Urinary retention   ? Accidental drug overdose   ? Postoperative pain   ? Hyponatremia   ? Cervical myelopathy ([place]) 02/01/2017  ? Shortness of breath at rest   ? Hypoglycemia   ? Spondylosis, cervical, with myelopathy   ? Neuropathic pain   ? Benign essential HTN   ? Labile blood glucose   ? Muscle spasm   ? GAD (generalized anxiety disorder)   ? Acute inflammatory demyelinating polyneuropathy ([place]) 01/11/2017  ? Anxiety state   ? Neurogenic bladder   ? Depression 12/30/2016  ? Leg weakness, bilateral  12/30/2016  ? 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 GBS (Guillain Barre syndrome) ([place])   ? AIDP (acute inflammatory demyelinating polyneuropathy) ([place]) 11/27/2016  ? Weakness 11/20/2016  ? Weakness of both arms 10/30/2016  ? 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 Obstructive sleep apnea 03/24/2016  ? Numbness 03/18/2016  ? Mild nonproliferative diabetic retinopathy of both eyes without macular edema associated with type 2 diabetes mellitus ([place]) 05/24/2015  ? Nuclear cataract of both eyes 05/24/2015  ? Diabetes mellitus without complication ([place]) 16/38/4665  ? Chronic prostatitis 03/01/2015  ? Eustachian tube dysfunction 02/12/2015  ? Acute maxillary sinusitis 02/12/2015  ? Wellness examination 08/22/2014  ? Alkaline phosphatase elevation 08/22/2014  ? 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 Benign prostatic hyperplasia with urinary frequency 01/31/2014  ? Lesion of eyelid 09/26/2013  ? Screening for prostate cancer 04/24/2013  ? Diabetes ([place]) 06/16/2012  ? ED (erectile dysfunction) of organic origin 06/03/2012  ? Routine general medical examination at a health care facility 04/10/2012  ? BPH (benign prostatic hyperplasia) 02/03/2010  ? HEMOCCULT POSITIVE STOOL 05/25/2008  ? ELBOW PAIN, RIGHT 07/04/2007  ? Dyslipidemia 02/16/2007  ? ANXIETY 02/16/2007  ? ERECTILE DYSFUNCTION 02/16/2007  ? Essential hypertension 02/16/2007  ? TESTICULAR MASS, LEFT 02/16/2007  ? Chronic pain of left knee 02/16/2007  ? ADENOIDECTOMY, HX OF 02/16/2007  ? ?Past Medical History:  ?Diagnosis Date  ? ADENOIDECTOMY, HX OF 02/16/2007  ? Anxiety   ? pt. states he does not have anxiety.  ? BENIGN PROSTATIC  HYPERTROPHY, WITH OBSTRUCTION 02/03/2010  ? Chronic inflammatory demyelinating polyneuropathy (HCC)   ? diagnosed 11/2016  ? DIABETES MELLITUS, TYPE I, UNCONTROLLED 12/29/2006  ? ERECTILE DYSFUNCTION 02/16/2007  ? Hearing loss   ? wears hearing aids  ? HYPERLIPIDEMIA  02/16/2007  ? HYPERTENSION 02/16/2007  ? Nerve pain   ? per patient "on lower back and both legs"  ? Sleep apnea   ? uses CPAP  ? TESTICULAR MASS, LEFT 02/16/2007  ? Therapeutic opioid-induced constipation ([facility])   ?  ?Past Surgical History:  ?Procedure Laterality Date  ? ANTERIOR CERVICAL DECOMP/DISCECTOMY FUSION N/A 02/01/2017  ? Procedure: ANTERIOR CERVICAL DECOMPRESSION/DISCECTOMY FUSION CERVICAL THREE-FOUR , CERVICAL FOUR-FIVE  CERVICAL FIVE-SIX;  Surgeon: Consuella Lose, MD;  Location: [facility];  Service: Neurosurgery;  Laterality: N/A;  ? APPENDECTOMY    ? BACK SURGERY    ? COLONOSCOPY  02/05/2010  ? [facility]  ? EYE SURGERY Right   ? Dr. Katy Fitch  ? KNEE SURGERY    ? 2 Lknee arthroscopy, 3 R knee arthroscpoy  ? KNEE SURGERY Right 11/12/2016  ? LUMBAR FUSION  03/26/2020  ? LUMBAR LAMINECTOMY/DECOMPRESSION MICRODISCECTOMY Right 05/22/2019  ? Procedure: LAMINOTOMY AND MICRODISCECTOMY RIGHT LUMBAR FOUR- LUMBAR FIVE;  Surgeon: Consuella Lose, MD;  Location: [place];  Service: Neurosurgery;  Laterality: Right;  LAMINOTOMY AND MICRODISCECTOMY RIGHT LUMBAR FOUR- LUMBAR FIVE  ? LUMBAR LAMINECTOMY/DECOMPRESSION MICRODISCECTOMY Right 02/01/2020  ? Procedure: REDO MICRODISCECTOMY RT LUMBAR FOUR-FIVE;  Surgeon: Consuella Lose, MD;  Location: [facility];  Service: Neurosurgery;  Laterality: Right;  posterior  ? TONSILLECTOMY    ?  ?No current facility-administered medications for this encounter.  ? ?Current Outpatient Medications  ?Medication Sig Dispense Refill Last Dose  ? acetaminophen (TYLENOL) 500 MG tablet Take 500 mg by mouth every 6 (six) hours as needed for mild pain.     ? amitriptyline (ELAVIL) 75 MG tablet TAKE 1 TABLET BY MOUTH AT  BEDTIME 90 tablet 3   ? B Complex Vitamins (B-COMPLEX/B-12 PO) Take 1 tablet by mouth at bedtime.     ? Cranberry 400 MG TABS Take 400 mg by mouth at bedtime.     ? DULoxetine (CYMBALTA) 60 MG capsule TAKE 1 CAPSULE BY MOUTH  DAILY (Patient taking differently: Take 60 mg by mouth at  bedtime.) 90 capsule 3   ? finasteride (PROSCAR) 5 MG tablet TAKE 1 TABLET BY MOUTH AT  BEDTIME 90 tablet 3   ? glucagon 1 MG injection Inject 1 mg into the vein once as needed. 1 each 12   ? insulin lispro (HUMALOG) 100 UNIT/ML injection Inject into the skin. Via insulin pump     ? lisinopril-hydrochlorothiazide (ZESTORETIC) 10-12.5 MG tablet TAKE 1 TABLET BY MOUTH AT  BEDTIME 90 tablet 3   ? methocarbamol (ROBAXIN) 750 MG tablet TAKE 1 TABLET BY MOUTH 3  TIMES DAILY AS NEEDED FOR  MUSCLE SPASM(S) 90 tablet 1   ? Multiple Vitamin (MULTIVITAMIN WITH MINERALS) TABS tablet Take 1 tablet by mouth at bedtime.     ? prochlorperazine (COMPAZINE) 5 MG tablet Take 5 mg by mouth every 6 (six) hours as needed for vomiting or nausea.     ? pseudoephedrine (SUDAFED) 30 MG tablet Take 30 mg by mouth at bedtime as needed for congestion.     ? scopolamine (TRANSDERM-SCOP) 1 MG/3DAYS Place 1 patch (1.5 mg total) onto the skin every 3 (three) days. (Patient taking differently: Place 1 patch onto the skin every 3 (three) days as needed (nausea with motion sickness (cruise)).)  10 patch 1   ? clotrimazole-betamethasone (LOTRISONE) cream Apply 1 application topically 2 (two) times daily as needed (apply to feet skin irritation). 45 g 2   ? glucose blood (BAYER CONTOUR NEXT TEST) test strip MEDICALLY NECESSARY FOR USE WITH PUMP; Use to check blood sugar 5 times per day and prn; E11.42 500 each 3   ? Immune Globulin 10% (IMMUNE GLOBULIN 10%) 10G/1106m (10,'000mg'$ /1075m SOLN Inject 105 g into the vein every 6 (six) weeks. 289.5 mL 10   ? Insulin Human (INSULIN PUMP) SOLN Inject 120 each into the skin daily. HUMALOG     ? Insulin Infusion Pump Supplies (PARADIGM RESERVOIR 3ML) MISC 1 Device by Does not apply route every 3 (three) days. 30 each 3   ? Insulin Infusion Pump Supplies (QUICK-SET INFUSION 43" 9MM) MISC 1 Device by Does not apply route every 3 (three) days. 30 each 3   ? lidocaine (LIDODERM) 5 % At 7 am and remove at 7 pm. Apply 1  on each side. 180 patch 2   ? traMADol (ULTRAM) 50 MG tablet Take 1 tablet (50 mg total) by mouth 2 (two) times daily as needed. 60 tablet 5   ? ?No Known Allergies  ?Social History  ? ?Tobacco Use  ?

## 2021-05-02 ENCOUNTER — Encounter (HOSPITAL_COMMUNITY): Payer: Medicare Other

## 2021-05-06 ENCOUNTER — Ambulatory Visit (HOSPITAL_BASED_OUTPATIENT_CLINIC_OR_DEPARTMENT_OTHER): Payer: Medicare Other | Admitting: Anesthesiology

## 2021-05-06 ENCOUNTER — Other Ambulatory Visit: Payer: Self-pay

## 2021-05-06 ENCOUNTER — Encounter (HOSPITAL_COMMUNITY): Payer: Self-pay | Admitting: Orthopedic Surgery

## 2021-05-06 ENCOUNTER — Encounter (HOSPITAL_COMMUNITY)
Admission: RE | Disposition: A | Discharge status: Home / Self Care | Payer: Self-pay | Source: Home / Self Care | Attending: Orthopedic Surgery

## 2021-05-06 ENCOUNTER — Observation Stay (HOSPITAL_COMMUNITY)
Admission: RE | Admit: 2021-05-06 | Discharge: 2021-05-07 | Disposition: A | Discharge status: Home / Self Care | Payer: Medicare Other | Attending: Orthopedic Surgery | Admitting: Orthopedic Surgery

## 2021-05-06 ENCOUNTER — Ambulatory Visit (HOSPITAL_COMMUNITY): Payer: Medicare Other | Admitting: Anesthesiology

## 2021-05-06 DIAGNOSIS — M1712 Unilateral primary osteoarthritis, left knee: Principal | ICD-10-CM | POA: Insufficient documentation

## 2021-05-06 DIAGNOSIS — E119 Type 2 diabetes mellitus without complications: Secondary | ICD-10-CM | POA: Insufficient documentation

## 2021-05-06 DIAGNOSIS — Z79899 Other long term (current) drug therapy: Secondary | ICD-10-CM | POA: Diagnosis not present

## 2021-05-06 DIAGNOSIS — I10 Essential (primary) hypertension: Secondary | ICD-10-CM | POA: Insufficient documentation

## 2021-05-06 DIAGNOSIS — Z794 Long term (current) use of insulin: Secondary | ICD-10-CM | POA: Insufficient documentation

## 2021-05-06 DIAGNOSIS — G6181 Chronic inflammatory demyelinating polyneuritis: Secondary | ICD-10-CM

## 2021-05-06 DIAGNOSIS — Z96652 Presence of left artificial knee joint: Secondary | ICD-10-CM

## 2021-05-06 DIAGNOSIS — Z9989 Dependence on other enabling machines and devices: Secondary | ICD-10-CM

## 2021-05-06 DIAGNOSIS — G4733 Obstructive sleep apnea (adult) (pediatric): Secondary | ICD-10-CM

## 2021-05-06 HISTORY — PX: TOTAL KNEE ARTHROPLASTY: SHX125

## 2021-05-06 LAB — GLUCOSE, CAPILLARY
Glucose-Capillary: 50 mg/dL — ABNORMAL LOW (ref 70–99)
Glucose-Capillary: 85 mg/dL (ref 70–99)
Glucose-Capillary: 103 mg/dL — ABNORMAL HIGH (ref 70–99)
Glucose-Capillary: 73 mg/dL (ref 70–99)
Glucose-Capillary: 180 mg/dL — ABNORMAL HIGH (ref 70–99)
Glucose-Capillary: 226 mg/dL — ABNORMAL HIGH (ref 70–99)
Glucose-Capillary: 325 mg/dL — ABNORMAL HIGH (ref 70–99)
Glucose-Capillary: 274 mg/dL — ABNORMAL HIGH (ref 70–99)
Glucose-Capillary: 266 mg/dL — ABNORMAL HIGH (ref 70–99)

## 2021-05-06 SURGERY — ARTHROPLASTY, KNEE, TOTAL
Anesthesia: Spinal | Site: Knee | Laterality: Left

## 2021-05-06 MED ORDER — AMITRIPTYLINE HCL 50 MG PO TABS
75.0000 mg | ORAL_TABLET | Freq: Every day | ORAL | Status: DC
  Administered 2021-05-06: 75 mg via ORAL
  Filled 2021-05-06: qty 1.5

## 2021-05-06 MED ORDER — ROPIVACAINE HCL 5 MG/ML IJ SOLN
INTRAMUSCULAR | Status: DC | PRN
  Administered 2021-05-06: 20 mL via PERINEURAL

## 2021-05-06 MED ORDER — POVIDONE-IODINE 10 % EX SWAB
2.0000 "application " | Freq: Once | CUTANEOUS | Status: AC
  Administered 2021-05-06: 2 via TOPICAL

## 2021-05-06 MED ORDER — HYDROMORPHONE HCL 1 MG/ML IJ SOLN
0.5000 mg | INTRAMUSCULAR | Status: DC | PRN
  Administered 2021-05-06: 1 mg via INTRAVENOUS
  Filled 2021-05-06: qty 1

## 2021-05-06 MED ORDER — OXYCODONE HCL 5 MG PO TABS
5.0000 mg | ORAL_TABLET | ORAL | Status: DC | PRN
  Administered 2021-05-06: 10 mg via ORAL
  Filled 2021-05-06: qty 2

## 2021-05-06 MED ORDER — PROCHLORPERAZINE MALEATE 5 MG PO TABS
5.0000 mg | ORAL_TABLET | Freq: Four times a day (QID) | ORAL | Status: DC | PRN
  Filled 2021-05-06: qty 1

## 2021-05-06 MED ORDER — TRANEXAMIC ACID-NACL 1000-0.7 MG/100ML-% IV SOLN
1000.0000 mg | INTRAVENOUS | Status: AC
  Administered 2021-05-06: 1000 mg via INTRAVENOUS
  Filled 2021-05-06: qty 100

## 2021-05-06 MED ORDER — METOCLOPRAMIDE HCL 5 MG PO TABS: 5.0000 mg | ORAL_TABLET | Freq: Three times a day (TID) | ORAL | Status: DC | PRN

## 2021-05-06 MED ORDER — ASPIRIN 81 MG PO CHEW
81.0000 mg | CHEWABLE_TABLET | Freq: Two times a day (BID) | ORAL | Status: DC
  Administered 2021-05-06 – 2021-05-07 (×2): 81 mg via ORAL
  Filled 2021-05-06 (×2): qty 1

## 2021-05-06 MED ORDER — BUPIVACAINE-EPINEPHRINE (PF) 0.25% -1:200000 IJ SOLN
INTRAMUSCULAR | Status: AC
  Filled 2021-05-06: qty 30

## 2021-05-06 MED ORDER — LISINOPRIL-HYDROCHLOROTHIAZIDE 10-12.5 MG PO TABS
1.0000 | ORAL_TABLET | Freq: Every day | ORAL | Status: DC
Start: 2021-05-07 — End: 2021-05-06

## 2021-05-06 MED ORDER — DEXTROSE 50 % IV SOLN: 25.0000 mL | Freq: Once | INTRAVENOUS | Status: AC

## 2021-05-06 MED ORDER — CHLORHEXIDINE GLUCONATE 0.12 % MT SOLN
15.0000 mL | Freq: Once | OROMUCOSAL | Status: AC
  Administered 2021-05-06: 15 mL via OROMUCOSAL

## 2021-05-06 MED ORDER — BUPIVACAINE IN DEXTROSE 0.75-8.25 % IT SOLN
INTRATHECAL | Status: DC | PRN
  Administered 2021-05-06: 1.8 mL via INTRATHECAL

## 2021-05-06 MED ORDER — PROPOFOL 10 MG/ML IV BOLUS
INTRAVENOUS | Status: AC
  Filled 2021-05-06: qty 20

## 2021-05-06 MED ORDER — MENTHOL 3 MG MT LOZG: 1.0000 | LOZENGE | OROMUCOSAL | Status: DC | PRN

## 2021-05-06 MED ORDER — DEXAMETHASONE SODIUM PHOSPHATE 10 MG/ML IJ SOLN
INTRAMUSCULAR | Status: AC
  Filled 2021-05-06: qty 1

## 2021-05-06 MED ORDER — ONDANSETRON HCL 4 MG/2ML IJ SOLN
INTRAMUSCULAR | Status: DC | PRN
  Administered 2021-05-06: 4 mg via INTRAVENOUS

## 2021-05-06 MED ORDER — BUPIVACAINE-EPINEPHRINE (PF) 0.25% -1:200000 IJ SOLN
INTRAMUSCULAR | Status: DC | PRN
  Administered 2021-05-06: 30 mL via PERINEURAL

## 2021-05-06 MED ORDER — FERROUS SULFATE 325 (65 FE) MG PO TABS
325.0000 mg | ORAL_TABLET | Freq: Three times a day (TID) | ORAL | Status: DC
  Administered 2021-05-06 – 2021-05-07 (×3): 325 mg via ORAL
  Filled 2021-05-06 (×3): qty 1

## 2021-05-06 MED ORDER — SODIUM CHLORIDE (PF) 0.9 % IJ SOLN
INTRAMUSCULAR | Status: AC
  Filled 2021-05-06: qty 30

## 2021-05-06 MED ORDER — KETOROLAC TROMETHAMINE 30 MG/ML IJ SOLN
INTRAMUSCULAR | Status: DC | PRN
  Administered 2021-05-06: 30 mg

## 2021-05-06 MED ORDER — CEFAZOLIN SODIUM-DEXTROSE 2-4 GM/100ML-% IV SOLN
2.0000 g | INTRAVENOUS | Status: AC
  Administered 2021-05-06: 2 g via INTRAVENOUS
  Filled 2021-05-06: qty 100

## 2021-05-06 MED ORDER — DEXTROSE 50 % IV SOLN
INTRAVENOUS | Status: AC
  Administered 2021-05-06: 25 mL via INTRAVENOUS
  Filled 2021-05-06: qty 50

## 2021-05-06 MED ORDER — DEXTROSE 50 % IV SOLN
INTRAVENOUS | Status: AC
  Filled 2021-05-06: qty 50

## 2021-05-06 MED ORDER — KETOROLAC TROMETHAMINE 30 MG/ML IJ SOLN
INTRAMUSCULAR | Status: AC
  Filled 2021-05-06: qty 1

## 2021-05-06 MED ORDER — PROPOFOL 10 MG/ML IV BOLUS
INTRAVENOUS | Status: DC | PRN
  Administered 2021-05-06: 30 mg via INTRAVENOUS
  Administered 2021-05-06: 20 mg via INTRAVENOUS

## 2021-05-06 MED ORDER — FINASTERIDE 5 MG PO TABS
5.0000 mg | ORAL_TABLET | Freq: Every day | ORAL | Status: DC
  Administered 2021-05-06: 5 mg via ORAL
  Filled 2021-05-06: qty 1

## 2021-05-06 MED ORDER — ONDANSETRON HCL 4 MG PO TABS: 4.0000 mg | ORAL_TABLET | Freq: Four times a day (QID) | ORAL | Status: DC | PRN

## 2021-05-06 MED ORDER — OXYCODONE HCL 5 MG PO TABS: 5.0000 mg | ORAL_TABLET | Freq: Once | ORAL | Status: DC | PRN

## 2021-05-06 MED ORDER — DIPHENHYDRAMINE HCL 12.5 MG/5ML PO ELIX: 12.5000 mg | ORAL_SOLUTION | ORAL | Status: DC | PRN

## 2021-05-06 MED ORDER — BISACODYL 10 MG RE SUPP: 10.0000 mg | Freq: Every day | RECTAL | Status: DC | PRN

## 2021-05-06 MED ORDER — SODIUM CHLORIDE 0.9 % IR SOLN
Status: DC | PRN
  Administered 2021-05-06: 1000 mL

## 2021-05-06 MED ORDER — ORAL CARE MOUTH RINSE: 15.0000 mL | Freq: Once | OROMUCOSAL | Status: AC

## 2021-05-06 MED ORDER — POLYETHYLENE GLYCOL 3350 17 G PO PACK: 17.0000 g | PACK | Freq: Every day | ORAL | Status: DC | PRN

## 2021-05-06 MED ORDER — ACETAMINOPHEN 325 MG PO TABS: 325.0000 mg | ORAL_TABLET | Freq: Four times a day (QID) | ORAL | Status: DC | PRN

## 2021-05-06 MED ORDER — PROPOFOL 500 MG/50ML IV EMUL
INTRAVENOUS | Status: DC | PRN
Start: 2021-05-06 — End: 2021-05-06
  Administered 2021-05-06: 75 ug/kg/min via INTRAVENOUS

## 2021-05-06 MED ORDER — MIDAZOLAM HCL 2 MG/2ML IJ SOLN
1.0000 mg | INTRAMUSCULAR | Status: DC
  Administered 2021-05-06: 1 mg via INTRAVENOUS
  Filled 2021-05-06: qty 2

## 2021-05-06 MED ORDER — HYDROMORPHONE HCL 1 MG/ML IJ SOLN: 0.2500 mg | INTRAMUSCULAR | Status: DC | PRN

## 2021-05-06 MED ORDER — PSEUDOEPHEDRINE HCL 30 MG PO TABS
30.0000 mg | ORAL_TABLET | Freq: Every evening | ORAL | Status: DC | PRN
  Filled 2021-05-06: qty 1

## 2021-05-06 MED ORDER — OXYCODONE HCL 5 MG/5ML PO SOLN: 5.0000 mg | Freq: Once | ORAL | Status: DC | PRN

## 2021-05-06 MED ORDER — DEXTROSE 50 % IV SOLN
25.0000 mL | INTRAVENOUS | Status: AC
  Administered 2021-05-06: 25 mL via INTRAVENOUS

## 2021-05-06 MED ORDER — DULOXETINE HCL 60 MG PO CPEP
60.0000 mg | ORAL_CAPSULE | Freq: Every day | ORAL | Status: DC
  Administered 2021-05-06: 60 mg via ORAL
  Filled 2021-05-06: qty 1

## 2021-05-06 MED ORDER — CEFAZOLIN SODIUM-DEXTROSE 2-4 GM/100ML-% IV SOLN
2.0000 g | Freq: Four times a day (QID) | INTRAVENOUS | Status: AC
  Administered 2021-05-06 – 2021-05-07 (×2): 2 g via INTRAVENOUS
  Filled 2021-05-06 (×2): qty 100

## 2021-05-06 MED ORDER — DEXAMETHASONE SODIUM PHOSPHATE 10 MG/ML IJ SOLN
8.0000 mg | Freq: Once | INTRAMUSCULAR | Status: DC
Start: 2021-05-06 — End: 2021-05-06

## 2021-05-06 MED ORDER — IMMUNE GLOBULIN (HUMAN) 10 GM/100ML IV SOLN: 1.0000 g/kg | INTRAVENOUS | Status: DC

## 2021-05-06 MED ORDER — PHENYLEPHRINE HCL-NACL 20-0.9 MG/250ML-% IV SOLN
INTRAVENOUS | Status: DC | PRN
  Administered 2021-05-06: 30 ug/min via INTRAVENOUS

## 2021-05-06 MED ORDER — LACTATED RINGERS IV SOLN
INTRAVENOUS | Status: DC
Start: 2021-05-06 — End: 2021-05-06

## 2021-05-06 MED ORDER — DEXAMETHASONE SODIUM PHOSPHATE 10 MG/ML IJ SOLN
10.0000 mg | Freq: Once | INTRAMUSCULAR | Status: AC
  Administered 2021-05-07: 10 mg via INTRAVENOUS
  Filled 2021-05-06: qty 1

## 2021-05-06 MED ORDER — TRANEXAMIC ACID-NACL 1000-0.7 MG/100ML-% IV SOLN
1000.0000 mg | Freq: Once | INTRAVENOUS | Status: AC
Start: 2021-05-06 — End: 2021-05-07
  Administered 2021-05-06: 1000 mg via INTRAVENOUS
  Filled 2021-05-06: qty 100

## 2021-05-06 MED ORDER — CELECOXIB 200 MG PO CAPS
200.0000 mg | ORAL_CAPSULE | Freq: Two times a day (BID) | ORAL | Status: DC
  Administered 2021-05-06 – 2021-05-07 (×2): 200 mg via ORAL
  Filled 2021-05-06 (×2): qty 1

## 2021-05-06 MED ORDER — OXYCODONE HCL 5 MG PO TABS
10.0000 mg | ORAL_TABLET | ORAL | Status: DC | PRN
  Administered 2021-05-07 (×3): 15 mg via ORAL
  Filled 2021-05-06 (×3): qty 3

## 2021-05-06 MED ORDER — ONDANSETRON HCL 4 MG/2ML IJ SOLN: 4.0000 mg | Freq: Once | INTRAMUSCULAR | Status: DC | PRN

## 2021-05-06 MED ORDER — PHENOL 1.4 % MT LIQD: 1.0000 | OROMUCOSAL | Status: DC | PRN

## 2021-05-06 MED ORDER — METHOCARBAMOL 1000 MG/10ML IJ SOLN
500.0000 mg | Freq: Four times a day (QID) | INTRAVENOUS | Status: DC | PRN
  Filled 2021-05-06: qty 5

## 2021-05-06 MED ORDER — DOCUSATE SODIUM 100 MG PO CAPS
100.0000 mg | ORAL_CAPSULE | Freq: Two times a day (BID) | ORAL | Status: DC
  Administered 2021-05-06 – 2021-05-07 (×2): 100 mg via ORAL
  Filled 2021-05-06 (×2): qty 1

## 2021-05-06 MED ORDER — LISINOPRIL 10 MG PO TABS
10.0000 mg | ORAL_TABLET | Freq: Every day | ORAL | Status: DC
  Administered 2021-05-06: 10 mg via ORAL
  Filled 2021-05-06: qty 1

## 2021-05-06 MED ORDER — DEXAMETHASONE SODIUM PHOSPHATE 10 MG/ML IJ SOLN
INTRAMUSCULAR | Status: DC | PRN
  Administered 2021-05-06: 10 mg via INTRAVENOUS

## 2021-05-06 MED ORDER — FENTANYL CITRATE PF 50 MCG/ML IJ SOSY
50.0000 ug | PREFILLED_SYRINGE | INTRAMUSCULAR | Status: DC
  Administered 2021-05-06: 50 ug via INTRAVENOUS
  Filled 2021-05-06: qty 2

## 2021-05-06 MED ORDER — PROPOFOL 500 MG/50ML IV EMUL
INTRAVENOUS | Status: AC
  Filled 2021-05-06: qty 50

## 2021-05-06 MED ORDER — ONDANSETRON HCL 4 MG/2ML IJ SOLN
INTRAMUSCULAR | Status: AC
  Filled 2021-05-06: qty 2

## 2021-05-06 MED ORDER — METOCLOPRAMIDE HCL 5 MG/ML IJ SOLN: 5.0000 mg | Freq: Three times a day (TID) | INTRAMUSCULAR | Status: DC | PRN

## 2021-05-06 MED ORDER — ONDANSETRON HCL 4 MG/2ML IJ SOLN: 4.0000 mg | Freq: Four times a day (QID) | INTRAMUSCULAR | Status: DC | PRN

## 2021-05-06 MED ORDER — LACTATED RINGERS IV SOLN: INTRAVENOUS | Status: DC

## 2021-05-06 MED ORDER — SODIUM CHLORIDE 0.9 % IV SOLN: INTRAVENOUS | Status: DC

## 2021-05-06 MED ORDER — HYDROCHLOROTHIAZIDE 12.5 MG PO TABS
12.5000 mg | ORAL_TABLET | Freq: Every day | ORAL | Status: DC
  Administered 2021-05-06: 12.5 mg via ORAL
  Filled 2021-05-06: qty 1

## 2021-05-06 MED ORDER — METHOCARBAMOL 500 MG PO TABS
500.0000 mg | ORAL_TABLET | Freq: Four times a day (QID) | ORAL | Status: DC | PRN
  Administered 2021-05-06 – 2021-05-07 (×2): 500 mg via ORAL
  Filled 2021-05-06 (×2): qty 1

## 2021-05-06 MED ORDER — SODIUM CHLORIDE (PF) 0.9 % IJ SOLN
INTRAMUSCULAR | Status: DC | PRN
  Administered 2021-05-06: 30 mL

## 2021-05-06 SURGICAL SUPPLY — 56 items
ADH SKN CLS APL DERMABOND .7 (GAUZE/BANDAGES/DRESSINGS) ×1
ATTUNE MED ANAT PAT 41 KNEE (Knees) ×1 IMPLANT
ATTUNE PS FEM LT SZ 7 CEM KNEE (Femur) ×1 IMPLANT
ATTUNE PSRP INSR SZ7 7 KNEE (Insert) ×1 IMPLANT
BAG COUNTER SPONGE SURGICOUNT (BAG) ×1 IMPLANT
BAG SPEC THK2 15X12 ZIP CLS (MISCELLANEOUS) ×1
BAG SPNG CNTER NS LX DISP (BAG) ×1
BAG ZIPLOCK 12X15 (MISCELLANEOUS) ×1 IMPLANT
BASE TIBIAL ROT PLAT SZ 7 KNEE (Knees) IMPLANT
BLADE SAW SGTL 11.0X1.19X90.0M (BLADE) ×1 IMPLANT
BLADE SAW SGTL 13.0X1.19X90.0M (BLADE) ×2 IMPLANT
BLADE SURG SZ10 CARB STEEL (BLADE) ×4 IMPLANT
BNDG ELASTIC 6X5.8 VLCR STR LF (GAUZE/BANDAGES/DRESSINGS) ×2 IMPLANT
BOWL SMART MIX CTS (DISPOSABLE) ×2 IMPLANT
BSPLAT TIB 7 CMNT ROT PLAT STR (Knees) ×1 IMPLANT
CEMENT HV SMART SET (Cement) ×2 IMPLANT
CUFF TOURN SGL QUICK 34 (TOURNIQUET CUFF) ×2
CUFF TRNQT CYL 34X4.125X (TOURNIQUET CUFF) ×1 IMPLANT
DERMABOND ADVANCED (GAUZE/BANDAGES/DRESSINGS) ×1
DERMABOND ADVANCED .7 DNX12 (GAUZE/BANDAGES/DRESSINGS) ×1 IMPLANT
DRAPE INCISE IOBAN 66X45 STRL (DRAPES) ×2 IMPLANT
DRAPE U-SHAPE 47X51 STRL (DRAPES) ×2 IMPLANT
DRESSING AQUACEL AG SP 3.5X10 (GAUZE/BANDAGES/DRESSINGS) ×1 IMPLANT
DRSG AQUACEL AG ADV 3.5X10 (GAUZE/BANDAGES/DRESSINGS) ×1 IMPLANT
DRSG AQUACEL AG SP 3.5X10 (GAUZE/BANDAGES/DRESSINGS) ×2
DURAPREP 26ML APPLICATOR (WOUND CARE) ×4 IMPLANT
ELECT REM PT RETURN 15FT ADLT (MISCELLANEOUS) ×2 IMPLANT
GLOVE SURG ENC MOIS LTX SZ6 (GLOVE) ×2 IMPLANT
GLOVE SURG ENC MOIS LTX SZ7 (GLOVE) ×2 IMPLANT
GLOVE SURG UNDER LTX SZ6.5 (GLOVE) ×2 IMPLANT
GLOVE SURG UNDER POLY LF SZ7.5 (GLOVE) ×2 IMPLANT
GOWN STRL REUS W/ TWL LRG LVL3 (GOWN DISPOSABLE) ×1 IMPLANT
GOWN STRL REUS W/TWL LRG LVL3 (GOWN DISPOSABLE) ×2
HANDPIECE INTERPULSE COAX TIP (DISPOSABLE) ×2
HOLDER FOLEY CATH W/STRAP (MISCELLANEOUS) ×1 IMPLANT
KIT TURNOVER KIT A (KITS) IMPLANT
MANIFOLD NEPTUNE II (INSTRUMENTS) ×2 IMPLANT
NDL SAFETY ECLIPSE 18X1.5 (NEEDLE) IMPLANT
NEEDLE HYPO 18GX1.5 SHARP (NEEDLE) ×2
NS IRRIG 1000ML POUR BTL (IV SOLUTION) ×2 IMPLANT
PACK TOTAL KNEE CUSTOM (KITS) ×2 IMPLANT
PROTECTOR NERVE ULNAR (MISCELLANEOUS) ×2 IMPLANT
SET HNDPC FAN SPRY TIP SCT (DISPOSABLE) ×1 IMPLANT
SET PAD KNEE POSITIONER (MISCELLANEOUS) ×2 IMPLANT
SPIKE FLUID TRANSFER (MISCELLANEOUS) ×2 IMPLANT
SUT MNCRL AB 4-0 PS2 18 (SUTURE) ×2 IMPLANT
SUT STRATAFIX PDS+ 0 24IN (SUTURE) ×2 IMPLANT
SUT VIC AB 1 CT1 36 (SUTURE) ×3 IMPLANT
SUT VIC AB 2-0 CT1 27 (SUTURE) ×6
SUT VIC AB 2-0 CT1 TAPERPNT 27 (SUTURE) ×3 IMPLANT
SYR 3ML LL SCALE MARK (SYRINGE) ×2 IMPLANT
TIBIAL BASE ROT PLAT SZ 7 KNEE (Knees) ×2 IMPLANT
TRAY FOLEY MTR SLVR 16FR STAT (SET/KITS/TRAYS/PACK) ×2 IMPLANT
TUBE SUCTION HIGH CAP CLEAR NV (SUCTIONS) ×2 IMPLANT
WATER STERILE IRR 1000ML POUR (IV SOLUTION) ×4 IMPLANT
WRAP KNEE MAXI GEL POST OP (GAUZE/BANDAGES/DRESSINGS) ×2 IMPLANT

## 2021-05-06 NOTE — Progress Notes (Signed)
Assisted Dr. Myrtie Soman with Left Knee Adductor Canal block. Side rails up, monitors on throughout procedure. See vital signs in flow sheet. Tolerated Procedure well. ? ?

## 2021-05-06 NOTE — Anesthesia Procedure Notes (Signed)
Anesthesia Procedure Image    

## 2021-05-06 NOTE — Anesthesia Procedure Notes (Signed)
Date/Time: 05/06/2021 12:24 PM ?Performed by: Sharlette Dense, CRNA ?Oxygen Delivery Method: Simple face mask ? ? ? ? ?

## 2021-05-06 NOTE — Anesthesia Procedure Notes (Signed)
Spinal ? ?Patient location during procedure: OR ?Start time: 05/06/2021 12:25 PM ?End time: 05/06/2021 12:29 PM ?Reason for block: surgical anesthesia ?Staffing ?Performed: anesthesiologist  ?Anesthesiologist: Myrtie Soman, MD ?Preanesthetic Checklist ?Completed: patient identified, IV checked, site marked, risks and benefits discussed, surgical consent, monitors and equipment checked, pre-op evaluation and timeout performed ?Spinal Block ?Patient position: sitting ?Prep: Betadine ?Patient monitoring: heart rate, continuous pulse ox and blood pressure ?Approach: midline ?Location: L3-4 ?Injection technique: single-shot ?Needle ?Needle type: Sprotte  ?Needle gauge: 24 G ?Needle length: 9 cm ?Assessment ?Sensory level: T6 ?Events: CSF return ?Additional Notes ? ? ? ? ? ? ?

## 2021-05-06 NOTE — Discharge Instructions (Signed)

## 2021-05-06 NOTE — Plan of Care (Signed)
?  Problem: Education: ?Goal: Knowledge of General Education information will improve ?Description: Including pain rating scale, medication(s)/side effects and non-pharmacologic comfort measures ?Outcome: Progressing ?  ?Problem: Activity: ?Goal: Risk for activity intolerance will decrease ?Outcome: Progressing ?  ?Problem: Nutrition: ?Goal: Adequate nutrition will be maintained ?Outcome: Progressing ?  ?Problem: Coping: ?Goal: Level of anxiety will decrease ?Outcome: Progressing ?  ?Problem: Pain Managment: ?Goal: General experience of comfort will improve ?Outcome: Progressing ?  ?Problem: Education: ?Goal: Knowledge of the prescribed therapeutic regimen will improve ?Outcome: Progressing ?  ?Problem: Pain Management: ?Goal: Pain level will decrease with appropriate interventions ?Outcome: Progressing ?  ?

## 2021-05-06 NOTE — Interval H&P Note (Signed)
History and Physical Interval Note: ? ?05/06/2021 ?11:18 AM ? ?Kenneth Pace  has presented today for surgery, with the diagnosis of Left knee osteoarthritis.  The various methods of treatment have been discussed with the patient and family. After consideration of risks, benefits and other options for treatment, the patient has consented to  Procedure(s): ?TOTAL KNEE ARTHROPLASTY (Left) as a surgical intervention.  The patient's history has been reviewed, patient examined, no change in status, stable for surgery.  I have reviewed the patient's chart and labs.  Questions were answered to the patient's satisfaction.   ? ? ?Mauri Pole ? ? ?

## 2021-05-06 NOTE — Progress Notes (Signed)
Pt declined cpap tonight.  Pt was advised that RT is available all night should he change his mind. ?

## 2021-05-06 NOTE — Transfer of Care (Signed)
Immediate Anesthesia Transfer of Care Note ? ?Patient: Kenneth Pace ? ?Procedure(s) Performed: TOTAL KNEE ARTHROPLASTY (Left: Knee) ? ?Patient Location: PACU ? ?Anesthesia Type:Spinal ? ?Level of Consciousness: awake, alert  and oriented ? ?Airway & Oxygen Therapy: Patient Spontanous Breathing and Patient connected to face mask oxygen ? ?Post-op Assessment: Report given to RN and Post -op Vital signs reviewed and stable ? ?Post vital signs: Reviewed and stable ? ?Last Vitals:  ?Vitals Value Taken Time  ?BP 113/64 05/06/21 1421  ?Temp    ?Pulse 79 05/06/21 1422  ?Resp 16 05/06/21 1422  ?SpO2 98 % 05/06/21 1422  ?Vitals shown include unvalidated device data. ? ?Last Pain:  ?Vitals:  ? 05/06/21 1052  ?TempSrc:   ?PainSc: 2   ?   ? ?  ? ?Complications: No notable events documented. ?

## 2021-05-06 NOTE — Anesthesia Preprocedure Evaluation (Signed)
Anesthesia Evaluation  ?Patient identified by MRN, date of birth, ID band ?Patient awake ? ? ? ?Reviewed: ?Allergy & Precautions, NPO status , Patient's Chart, lab work & pertinent test results ? ?Airway ?Mallampati: II ? ?TM Distance: <3 FB ?Neck ROM: Full ? ? ? Dental ?no notable dental hx. ? ?  ?Pulmonary ?sleep apnea and Continuous Positive Airway Pressure Ventilation ,  ?  ?Pulmonary exam normal ?breath sounds clear to auscultation ? ? ? ? ? ? Cardiovascular ?hypertension, Pt. on medications ?Normal cardiovascular exam ?Rhythm:Regular Rate:Normal ? ? ?  ?Neuro/Psych ?Chronic inflammatory demyelinating polyneuropathy  ? Neuromuscular disease negative psych ROS  ? GI/Hepatic ?negative GI ROS, Neg liver ROS,   ?Endo/Other  ?diabetes, Type 1 ? Renal/GU ?negative Renal ROS  ?negative genitourinary ?  ?Musculoskeletal ? ?(+) Arthritis , Osteoarthritis,   ? Abdominal ?  ?Peds ?negative pediatric ROS ?(+)  Hematology ?negative hematology ROS ?(+)   ?Anesthesia Other Findings ? ? Reproductive/Obstetrics ?negative OB ROS ? ?  ? ? ? ? ? ? ? ? ? ? ? ? ? ?  ?  ? ? ? ? ? ? ? ? ?Anesthesia Physical ?Anesthesia Plan ? ?ASA: 3 ? ?Anesthesia Plan: Spinal  ? ?Post-op Pain Management: Regional block* and Dilaudid IV  ? ?Induction: Intravenous ? ?PONV Risk Score and Plan: 2 and Treatment may vary due to age or medical condition, Propofol infusion and Ondansetron ? ?Airway Management Planned: Simple Face Mask ? ?Additional Equipment:  ? ?Intra-op Plan:  ? ?Post-operative Plan:  ? ?Informed Consent: I have reviewed the patients History and Physical, chart, labs and discussed the procedure including the risks, benefits and alternatives for the proposed anesthesia with the patient or authorized representative who has indicated his/her understanding and acceptance.  ? ? ? ?Dental advisory given ? ?Plan Discussed with: CRNA and Surgeon ? ?Anesthesia Plan Comments:   ? ? ? ? ? ? ?Anesthesia Quick  Evaluation ? ?

## 2021-05-06 NOTE — Op Note (Signed)
NAME:  Kenneth Pace                  ?   ? MEDICAL RECORD NO.:  [id number]  ?   ?                        FACILITY:  [facility]  ?   ? PHYSICIAN:  Pietro Cassis. Alvan Dame, M.D.  DATE OF BIRTH:  11-22-1950  ?   ? DATE OF PROCEDURE:  05/06/2021   ?  ?   ?                             OPERATIVE REPORT  ?   ?   ? PREOPERATIVE DIAGNOSIS:  Left knee osteoarthritis.  ?   ? POSTOPERATIVE DIAGNOSIS:  Left knee osteoarthritis.  ?   ? FINDINGS:  The patient was noted to have complete loss of cartilage and  ? bone-on-bone arthritis with associated osteophytes in the medial and patellofemoral compartments of  ? the knee with a significant synovitis and associated effusion.  The patient had failed months of conservative treatment including medications, injection therapy, activity modification. ?   ? PROCEDURE:  Left total knee replacement.  ?   ? COMPONENTS USED:  DePuy Attune rotating platform posterior stabilized knee  ? system, a size 7 femur, 7 tibia, size 7 mm PS AOX insert, and 41 anatomic patellar  ? button.  ?   ? SURGEON:  Pietro Cassis. Alvan Dame, M.D.  ?   ? ASSISTANT:  Costella Hatcher, PA-C.  ?   ? ANESTHESIA:  Regional and Spinal.  ?   ? SPECIMENS:  None.  ?   ? COMPLICATION:  None.  ?   ? DRAINS:  None. ? ?EBL: <200 cc  ?   ? TOURNIQUET TIME:  38 min at 225 mmHG ?   ? The patient was stable to the recovery room.  ?   ? INDICATION FOR PROCEDURE:  Kenneth Pace is a 71 y.o. male patient of  ? mine.  The patient had been seen, evaluated, and treated for months conservatively in the  ? office with medication, activity modification, and injections.  The patient had  ? radiographic changes of bone-on-bone arthritis with endplate sclerosis and osteophytes noted.  Based on the radiographic changes and failed conservative measures, the patient  ? decided to proceed with definitive treatment, total knee replacement.  Risks of infection, DVT, component failure, need for revision surgery, neurovascular injury were reviewed in the office  setting.  The postop course was reviewed stressing the efforts to maximize post-operative satisfaction and function.  Consent was obtained for benefit of pain  ? relief.  ?   ? PROCEDURE IN DETAIL:  The patient was brought to the operative theater.  ? Once adequate anesthesia, preoperative antibiotics, 2 gm of Ancef,1 gm of Tranexamic Acid, and 10 mg of Decadron administered, the patient was positioned supine with a left thigh tourniquet placed.  The  ?left lower extremity was prepped and draped in sterile fashion.  A time-  ? out was performed identifying the patient, planned procedure, and the appropriate extremity.  ?   ? The left lower extremity was placed in the [facility] leg holder.  The leg was  ? exsanguinated, tourniquet elevated to 250 mmHg.  A midline incision was  ? made followed by median parapatellar arthrotomy.  Following initial  ? exposure, attention was  first directed to the patella.  Precut  ? measurement was noted to be 26 mm.  I resected down to 14 mm and used a  ? 41 anatomic patellar button to restore patellar height as well as cover the cut surface.  ?   ? The lug holes were drilled and a metal shim was placed to protect the  ? patella from retractors and saw blade during the procedure.  ?   ? At this point, attention was now directed to the femur.  The femoral  ? canal was opened with a drill, irrigated to try to prevent fat emboli.  An  ? intramedullary rod was passed at 5 degrees valgus, 9 mm of bone was  ? resected off the distal femur.  Following this resection, the tibia was  ? subluxated anteriorly.  Using the extramedullary guide, 2 mm of bone was resected off  ? the proximal medial tibia.  We confirmed the gap would be  ? stable medially and laterally with a size 5 spacer block as well as confirmed that the tibial cut was perpendicular in the coronal plane, checking with an alignment rod.  ?   ? Once this was done, I sized the femur to be a size 7 in the anterior-  ? posterior  dimension, chose a standard component based on medial and  ? lateral dimension.  The size 7 rotation block was then pinned in  ? position anterior referenced using the C-clamp to set rotation.  The  ? anterior, posterior, and  chamfer cuts were made without difficulty nor  ? notching making certain that I was along the anterior cortex to help  ? with flexion gap stability.  ?   ? The final box cut was made off the lateral aspect of distal femur.  ?   ? At this point, the tibia was sized to be a size 7.  The size 7 tray was  ? then pinned in position through the medial third of the tubercle,  ? drilled, and keel punched.  Trial reduction was now carried with a 7 femur, ? 7 tibia, a size 7 mm PS insert, and the 41 anatomic patella botton.  The knee was brought to full extension with good flexion stability with the patella  ? tracking through the trochlea without application of pressure.  Given  ? all these findings the trial components removed.  Final components were  ? opened and cement was mixed.  The knee was irrigated with normal saline solution and pulse lavage.  The synovial lining was  ? then injected with 30 cc of 0.25% Marcaine with epinephrine, 1 cc of Toradol and 30 cc of NS for a total of 61 cc.  ?   ?Final implants were then cemented onto cleaned and dried cut surfaces of bone with the knee brought to extension with a size 7 mm PS trial insert.  ?   ? Once the cement had fully cured, excess cement was removed  ? throughout the knee.  I confirmed that I was satisfied with the range of  ? motion and stability, and the final size 7 mm PS AOX insert was chosen.  It was  ? placed into the knee.  ?   ? The tourniquet had been let down at 38 minutes.  No significant  ? hemostasis was required.  The extensor mechanism was then reapproximated using #1 Vicryl and #1 Stratafix sutures with the knee  ? in flexion.  The  ?  remaining wound was closed with 2-0 Vicryl and running 4-0 Monocryl.  ? The knee was cleaned,  dried, dressed sterilely using Dermabond and  ? Aquacel dressing.  The patient was then  ? brought to recovery room in stable condition, tolerating the procedure  ? well.  ? ?Please note that Physician Assistant, Costella Hatcher, PA-C was present for the entirety of the case, and was utilized for pre-operative positioning, peri-operative retractor management, general facilitation of the procedure and for primary wound closure at the end of the case. ?   ?   ?   ?   ? Pietro Cassis Alvan Dame, M.D.  ? ? ?05/06/2021 12:28 PM  ?

## 2021-05-06 NOTE — Anesthesia Procedure Notes (Signed)
Anesthesia Regional Block: Adductor canal block  ? ?Pre-Anesthetic Checklist: , timeout performed,  Correct Patient, Correct Site, Correct Laterality,  Correct Procedure, Correct Position, site marked,  Risks and benefits discussed,  Surgical consent,  Pre-op evaluation,  At surgeon's request and post-op pain management ? ?Laterality: Left ? ?Prep: chloraprep     ?  ?Needles:  ?Injection technique: Single-shot ? ?Needle Type: Echogenic Needle   ? ? ?Needle Length: 9cm  ? ? ? ? ?Additional Needles: ? ? ?Procedures:,,,, ultrasound used (permanent image in chart),,    ?Narrative:  ?Start time: 05/06/2021 11:47 AM ?End time: 05/06/2021 11:51 AM ?Injection made incrementally with aspirations every 5 mL. ? ?Performed by: Personally  ?Anesthesiologist: Myrtie Soman, MD ? ?Additional Notes: ?Patient tolerated the procedure well without complications ? ? ? ?

## 2021-05-06 NOTE — Evaluation (Signed)
Physical Therapy Evaluation ?Patient Details ?Name: Kenneth Pace ?MRN: [id number] ?DOB: 1950/11/08 ?Today's Date: 05/06/2021 ? ?History of Present Illness ? Pt is a 71yo male presenting s/p L TKA on 05/06/21. PMH: anxiety, BPH, chronic inflammatory demyelinating polyneuropathy, DM, hyperlipidmia, HTN, x5 knee arthroscopies (2 L, 3 R), ACDF 2018, lumbar fusion L4-5 2022. ?  ?Clinical Impression ? Kenneth Pace is a 71 y.o. male POD 0 s/p L TKA. Patient reports modified independence with mobility at baseline (uses [facility] for mobility and increased time for ADLs). Patient is now limited by functional impairments (see PT problem list below) and requires min guard for transfers and bed mobility. Patient instructed in exercises to facilitate ROM and circulation to manage edema. Patient will benefit from continued skilled PT interventions to address impairments and progress towards PLOF. Acute PT will follow to progress mobility and stair training in preparation for safe discharge home. ?   ? ?Recommendations for follow up therapy are one component of a multi-disciplinary discharge planning process, led by the attending physician.  Recommendations may be updated based on patient status, additional functional criteria and insurance authorization. ? ?Follow Up Recommendations Follow physician's recommendations for discharge plan and follow up therapies ? ?  ?Assistance Recommended at Discharge Intermittent Supervision/Assistance  ?Patient can return home with the following ? A little help with walking and/or transfers;A little help with bathing/dressing/bathroom;Assistance with cooking/housework;Assist for transportation;Help with stairs or ramp for entrance ? ?  ?Equipment Recommendations None recommended by PT (Pt has required DME)  ?Recommendations for Other Services ?    ?  ?Functional Status Assessment Patient has had a recent decline in their functional status and demonstrates the ability to make significant  improvements in function in a reasonable and predictable amount of time.  ? ?  ?Precautions / Restrictions Precautions ?Precautions: Fall ?Precaution Comments: history of falls ?Restrictions ?Weight Bearing Restrictions: Yes ?LLE Weight Bearing: Weight bearing as tolerated  ? ?  ? ?Mobility ? Bed Mobility ?Overal bed mobility: Needs Assistance ?Bed Mobility: Supine to Sit ?  ?  ?Supine to sit: Supervision ?  ?  ?General bed mobility comments: Pt supervision for safety and line management; highly impulsive so required several cues to wait for PT setup prior to initiating movement. ?  ? ?Transfers ?Overall transfer level: Needs assistance ?Equipment used: Rolling walker (2 wheels) ?Transfers: Sit to/from Stand, Bed to chair/wheelchair/BSC ?Sit to Stand: Min guard ?  ?Step pivot transfers: Min guard ?  ?  ?  ?General transfer comment: Pt min guard for safety and line management only, no physical assist or cuing required. ?  ? ?Ambulation/Gait ?  ?  ?  ?  ?  ?  ?  ?General Gait Details: deferred secondary to glucose levels, RN is aware. ? ?Stairs ?  ?  ?  ?  ?  ? ?Wheelchair Mobility ?  ? ?Modified Rankin (Stroke Patients Only) ?  ? ?  ? ?Balance Overall balance assessment: Needs assistance ?Sitting-balance support: Feet supported, No upper extremity supported ?Sitting balance-Leahy Scale: Good ?  ?  ?Standing balance support: Bilateral upper extremity supported, During functional activity, Reliant on assistive device for balance ?Standing balance-Leahy Scale: Poor ?Standing balance comment: Pt reliant on BUE support on RW during functional mobility. Pt also reports history of falling. ?  ?  ?  ?  ?  ?  ?  ?  ?  ?  ?  ?   ? ? ? ?Pertinent Vitals/Pain Pain Assessment ?Pain Assessment: 0-10 ?Pain Score:  3  ?Pain Location: L knee ?Pain Descriptors / Indicators: Operative site guarding, Cramping ?Pain Intervention(s): Limited activity within patient's tolerance, Repositioned, Monitored during session, Ice applied  ? ? ?Home  Living Family/patient expects to be discharged to:: Private residence ?Living Arrangements: Spouse/significant other ?Available Help at Discharge: Family;Available 24 hours/day (Wife [name]) ?Type of Home: House ?Home Access: Stairs to enter ?Entrance Stairs-Rails: None ?Entrance Stairs-Number of Steps: 2 ?Alternate Level Stairs-Number of Steps: 17 ?Home Layout: Two level ?Home Equipment: Grab bars - tub/shower;Shower seat;Cane - single Barista (2 wheels);Rollator (4 wheels);Electric scooter ?   ?  ?Prior Function Prior Level of Function : Independent/Modified Independent;Driving;History of Falls (last six months) ?  ?  ?  ?  ?  ?  ?Mobility Comments: SPC for all mobility. Pt reports a fall every month, though reports they are usually from dogs jumping on him. ?ADLs Comments: mod I for increased time ?  ? ? ?Hand Dominance  ?   ? ?  ?Extremity/Trunk Assessment  ? Upper Extremity Assessment ?Upper Extremity Assessment: Overall WFL for tasks assessed ?  ? ?Lower Extremity Assessment ?Lower Extremity Assessment: RLE deficits/detail;LLE deficits/detail ?RLE Deficits / Details: MMT: ank df/pf 5/5 ?RLE Sensation: WNL ?LLE Deficits / Details: MMT: ank DF/PF 5/5, no extensor lag noted ?LLE Sensation: WNL ?  ? ?Cervical / Trunk Assessment ?Cervical / Trunk Assessment: Back Surgery;Neck Surgery  ?Communication  ? Communication: No difficulties  ?Cognition Arousal/Alertness: Awake/alert ?Behavior During Therapy: [facility] for tasks assessed/performed ?Overall Cognitive Status: Within Functional Limits for tasks assessed ?  ?  ?  ?  ?  ?  ?  ?  ?  ?  ?  ?  ?  ?  ?  ?  ?General Comments: Pt highly impulsive and required several cues to wait and stop prior to initiation movement or transfer. ?  ?  ? ?  ?General Comments   ? ?  ?Exercises Total Joint Exercises ?Ankle Circles/Pumps: AROM, 20 reps, Both ?Quad Sets: AROM, Left, 10 reps  ? ?Assessment/Plan  ?  ?PT Assessment Patient needs continued PT services  ?PT Problem List  Decreased strength;Decreased range of motion;Decreased activity tolerance;Decreased balance;Decreased mobility;Decreased safety awareness;Pain ? ?   ?  ?PT Treatment Interventions DME instruction;Stair training;Gait training;Functional mobility training;Therapeutic activities;Therapeutic exercise;Balance training;Neuromuscular re-education;Patient/family education   ? ?PT Goals (Current goals can be found in the Care Plan section)  ?Acute Rehab PT Goals ?Patient Stated Goal: To get back to gardening ?PT Goal Formulation: With patient ?Time For Goal Achievement: 05/13/21 ?Potential to Achieve Goals: Good ? ?  ?Frequency 7X/week ?  ? ? ?Co-evaluation   ?  ?  ?  ?  ? ? ?  ?AM-PAC PT "6 Clicks" Mobility  ?Outcome Measure Help needed turning from your back to your side while in a flat bed without using bedrails?: A Little ?Help needed moving from lying on your back to sitting on the side of a flat bed without using bedrails?: A Little ?Help needed moving to and from a bed to a chair (including a wheelchair)?: A Little ?Help needed standing up from a chair using your arms (e.g., wheelchair or bedside chair)?: A Little ?Help needed to walk in hospital room?: A Little ?Help needed climbing 3-5 steps with a railing? : A Little ?6 Click Score: 18 ? ?  ?End of Session Equipment Utilized During Treatment: Gait belt ?Activity Tolerance: Patient tolerated treatment well ?Patient left: in chair;with call bell/phone within reach;with chair alarm set ?Nurse Communication: Mobility status ?  PT Visit Diagnosis: Difficulty in walking, not elsewhere classified (R26.2) ?  ? ?Time: 1959-7471 ?PT Time Calculation (min) (ACUTE ONLY): 20 min ? ? ?Charges:   PT Evaluation ?$PT Eval Low Complexity: 1 Low ?  ?  ?   ? ? ?Coolidge Breeze, PT, DPT ?[facility] Department ?Office: [phone] ?Pager: [phone] ? ?Coolidge Breeze ?05/06/2021, 6:54 PM ? ?

## 2021-05-07 ENCOUNTER — Encounter (HOSPITAL_COMMUNITY): Payer: Self-pay | Admitting: Orthopedic Surgery

## 2021-05-07 DIAGNOSIS — M1712 Unilateral primary osteoarthritis, left knee: Secondary | ICD-10-CM | POA: Diagnosis not present

## 2021-05-07 LAB — CBC
HCT: 38.7 % — ABNORMAL LOW (ref 39.0–52.0)
Hemoglobin: 13.3 g/dL (ref 13.0–17.0)
MCH: 29.8 pg (ref 26.0–34.0)
MCHC: 34.4 g/dL (ref 30.0–36.0)
MCV: 86.6 fL (ref 80.0–100.0)
Platelets: 225 10*3/uL (ref 150–400)
RBC: 4.47 MIL/uL (ref 4.22–5.81)
RDW: 13.3 % (ref 11.5–15.5)
WBC: 17.1 10*3/uL — ABNORMAL HIGH (ref 4.0–10.5)
nRBC: 0 % (ref 0.0–0.2)

## 2021-05-07 LAB — BASIC METABOLIC PANEL
Anion gap: 7 (ref 5–15)
BUN: 26 mg/dL — ABNORMAL HIGH (ref 8–23)
CO2: 24 mmol/L (ref 22–32)
Calcium: 8.5 mg/dL — ABNORMAL LOW (ref 8.9–10.3)
Chloride: 102 mmol/L (ref 98–111)
Creatinine, Ser: 0.73 mg/dL (ref 0.61–1.24)
GFR, Estimated: >60 mL/min (ref >60)
Glucose, Bld: 126 mg/dL — ABNORMAL HIGH (ref 70–99)
Potassium: 3.8 mmol/L (ref 3.5–5.1)
Sodium: 133 mmol/L — ABNORMAL LOW (ref 135–145)

## 2021-05-07 LAB — GLUCOSE, CAPILLARY
Glucose-Capillary: 115 mg/dL — ABNORMAL HIGH (ref 70–99)
Glucose-Capillary: 168 mg/dL — ABNORMAL HIGH (ref 70–99)
Glucose-Capillary: 175 mg/dL — ABNORMAL HIGH (ref 70–99)

## 2021-05-07 MED ORDER — CELECOXIB 200 MG PO CAPS: 200.0000 mg | ORAL_CAPSULE | Freq: Two times a day (BID) | ORAL | 0 refills | Status: DC

## 2021-05-07 MED ORDER — INSULIN PUMP
Freq: Three times a day (TID) | SUBCUTANEOUS | Status: DC
  Filled 2021-05-07: qty 1

## 2021-05-07 MED ORDER — DOCUSATE SODIUM 100 MG PO CAPS: 100.0000 mg | ORAL_CAPSULE | Freq: Two times a day (BID) | ORAL | 0 refills | Status: DC

## 2021-05-07 MED ORDER — POLYETHYLENE GLYCOL 3350 17 G PO PACK: 17.0000 g | PACK | Freq: Every day | ORAL | 0 refills | Status: DC | PRN

## 2021-05-07 MED ORDER — OXYCODONE HCL 5 MG PO TABS: 5.0000 mg | ORAL_TABLET | ORAL | 0 refills | Status: DC | PRN

## 2021-05-07 MED ORDER — ASPIRIN 81 MG PO CHEW: 81.0000 mg | CHEWABLE_TABLET | Freq: Two times a day (BID) | ORAL | 0 refills | Status: AC

## 2021-05-07 NOTE — Progress Notes (Signed)
Physical Therapy Treatment ?Patient Details ?Name: Kenneth Pace ?MRN: [id number] ?DOB: 02-05-1951 ?Today's Date: 05/07/2021 ? ? ?History of Present Illness Pt is a 72yo male presenting s/p L TKA on 05/06/21. PMH: anxiety, BPH, chronic inflammatory demyelinating polyneuropathy, DM, hyperlipidmia, HTN, x5 knee arthroscopies (2 L, 3 R), ACDF 2018, lumbar fusion L4-5 2022. ? ?  ?PT Comments  ? ? Patient requires min assistance on steps. Will practice when wife arrives, patient has  H/O balance  issues.   ?Recommendations for follow up therapy are one component of a multi-disciplinary discharge planning process, led by the attending physician.  Recommendations may be updated based on patient status, additional functional criteria and insurance authorization. ? ?Follow Up Recommendations ? Follow physician's recommendations for discharge plan and follow up therapies ?  ?  ?Assistance Recommended at Discharge Intermittent Supervision/Assistance  ?Patient can return home with the following A little help with walking and/or transfers;A little help with bathing/dressing/bathroom;Assistance with cooking/housework;Assist for transportation;Help with stairs or ramp for entrance ?  ?Equipment Recommendations ? None recommended by PT  ?  ?Recommendations for Other Services   ? ? ?  ?Precautions / Restrictions Precautions ?Precaution Comments: history of falls  ?  ? ?Mobility ? Bed Mobility ?  ?Bed Mobility: Supine to Sit ?  ?  ?Supine to sit: Supervision ?  ?  ?General bed mobility comments: in recliner ?  ? ?Transfers ?  ?Equipment used: Rolling walker (2 wheels) ?Transfers: Sit to/from Stand ?Sit to Stand: Min guard ?  ?  ?  ?  ?  ?General transfer comment: Pt min guard for safety and line management only, ?  ? ?Ambulation/Gait ?Ambulation/Gait assistance: Min guard ?Gait Distance (Feet): 50 Feet ?Assistive device: Rolling walker (2 wheels) ?Gait Pattern/deviations: Step-through pattern, Step-to pattern, Antalgic ?  ?  ?   ?General Gait Details: cues for safe use of RW, patient impulsive at time ? ? ?Stairs ?Stairs: Yes ?Stairs assistance: Min assist ?Stair Management: One rail Left, Forwards, With walker, With cane ?  ?General stair comments: patient practiced forward  with right cane and wall goiing up and RW going down, performed x 2 ? ? ?Wheelchair Mobility ?  ? ?Modified Rankin (Stroke Patients Only) ?  ? ? ?  ?Balance   ?  ?  ?  ?  ?  ?  ?  ?  ?  ?  ?  ?  ?  ?  ?  ?  ?  ?  ?  ? ?  ?Cognition Arousal/Alertness: Awake/alert ?Behavior During Therapy: Impulsive, WFL for tasks assessed/performed ?  ?  ?  ?  ?  ?  ?  ?  ?  ?  ?  ?  ?  ?  ?  ?  ?  ?  ?  ?  ? ?  ?Exercises  ? ?  ?General Comments   ?  ?  ? ?Pertinent Vitals/Pain Pain Assessment ?Pain Score: 2  ?Pain Location: L knee ?Pain Descriptors / Indicators: Operative site guarding, Cramping ?Pain Intervention(s): Monitored during session, Premedicated before session, Ice applied  ? ? ?Home Living   ?  ?  ?  ?  ?  ?  ?  ?  ?  ?   ?  ?Prior Function    ?  ?  ?   ? ?PT Goals (current goals can now be found in the care plan section) Progress towards PT goals: Progressing toward goals ? ?  ?Frequency ? ? ? 7X/week ? ? ? ?  ?  PT Plan Current plan remains appropriate  ? ? ?Co-evaluation   ?  ?  ?  ?  ? ?  ?AM-PAC PT "6 Clicks" Mobility   ?Outcome Measure ? Help needed turning from your back to your side while in a flat bed without using bedrails?: None ?Help needed moving from lying on your back to sitting on the side of a flat bed without using bedrails?: None ?Help needed moving to and from a bed to a chair (including a wheelchair)?: A Little ?Help needed standing up from a chair using your arms (e.g., wheelchair or bedside chair)?: A Little ?Help needed to walk in hospital room?: A Little ?Help needed climbing 3-5 steps with a railing? : A Little ?6 Click Score: 20 ? ?  ?End of Session Equipment Utilized During Treatment: Gait belt ?Activity Tolerance: Patient tolerated treatment  well ?Patient left: in chair;with call bell/phone within reach;with chair alarm set ?Nurse Communication: Mobility status ?PT Visit Diagnosis: Difficulty in walking, not elsewhere classified (R26.2) ?  ? ? ?Time: 5809-9833 ?PT Time Calculation (min) (ACUTE ONLY): 15 min ? ?Charges:  $Gait Training: 8-22 mins ?$         ?          ?Tresa Endo PT ?Acute Rehabilitation Services ?Pager [phone] ?Office [phone] ? ?Claretha Cooper ?05/07/2021, 3:43 PM ? ?

## 2021-05-07 NOTE — TOC Transition Note (Signed)
Transition of Care (TOC) - CM/SW Discharge Note ? ? ?Patient Details  ?Name: LUCIANO CINQUEMANI ?MRN: [id number] ?Date of Birth: 20-Aug-1950 ? ?Transition of Care (TOC) CM/SW Contact:  ?[place], LCSW ?Phone Number: ?05/07/2021, 11:06 AM ? ? ?Clinical Narrative:    ?Met with pt and confirming he has all needed DME at home.  Plan for OPPT in [place] [facility]).  No TOC needs. ? ? ?Final next level of care: OP Rehab ?Barriers to Discharge: No Barriers Identified ? ? ?Patient Goals and CMS Choice ?Patient states their goals for this hospitalization and ongoing recovery are:: return home ?  ?  ? ?Discharge Placement ?  ?           ?  ?  ?  ?  ? ?Discharge Plan and Services ?  ?  ?           ?DME Arranged: N/A ?DME Agency: NA ?  ?  ?  ?  ?  ?  ?  ?  ? ?Social Determinants of Health (SDOH) Interventions ?  ? ? ?Readmission Risk Interventions ? ?  02/02/2020  ? 11:39 AM  ?Readmission Risk Prevention Plan  ?Post Dischage Appt Complete  ?Medication Screening Complete  ?Transportation Screening Complete  ? ? ? ? ? ?

## 2021-05-07 NOTE — Progress Notes (Signed)
Physical Therapy Treatment ?Patient Details ?Name: Kenneth Pace ?MRN: [id number] ?DOB: 1950/03/23 ?Today's Date: 05/07/2021 ? ? ?History of Present Illness Pt is a 71yo male presenting s/p L TKA on 05/06/21. PMH: anxiety, BPH, chronic inflammatory demyelinating polyneuropathy, DM, hyperlipidmia, HTN, x5 knee arthroscopies (2 L, 3 R), ACDF 2018, lumbar fusion L4-5 2022. ? ?  ?PT Comments  ? ? Patient  progressing well. Frequent cues for safety when ambulating. Will practice steps next visit.   ?Recommendations for follow up therapy are one component of a multi-disciplinary discharge planning process, led by the attending physician.  Recommendations may be updated based on patient status, additional functional criteria and insurance authorization. ? ?Follow Up Recommendations ? Follow physician's recommendations for discharge plan and follow up therapies ?  ?  ?Assistance Recommended at Discharge Intermittent Supervision/Assistance  ?Patient can return home with the following A little help with walking and/or transfers;A little help with bathing/dressing/bathroom;Assistance with cooking/housework;Assist for transportation;Help with stairs or ramp for entrance ?  ?Equipment Recommendations ? None recommended by PT  ?  ?Recommendations for Other Services   ? ? ?  ?Precautions / Restrictions Precautions ?Precaution Comments: history of falls  ?  ? ?Mobility ? Bed Mobility ?  ?Bed Mobility: Supine to Sit ?  ?  ?Supine to sit: Supervision ?  ?  ?  ?  ? ?Transfers ?  ?Equipment used: Rolling walker (2 wheels) ?Transfers: Sit to/from Stand ?Sit to Stand: Min guard ?  ?  ?  ?  ?  ?General transfer comment: Pt min guard for safety and line management only, ?  ? ?Ambulation/Gait ?Ambulation/Gait assistance: Min guard ?Gait Distance (Feet): 120 Feet ?Assistive device: Rolling walker (2 wheels) ?Gait Pattern/deviations: Step-through pattern, Step-to pattern, Antalgic ?  ?  ?  ?General Gait Details: cues for safe use of RW,  patient impulsive at time ? ? ?Stairs ?  ?  ?  ?  ?  ? ? ?Wheelchair Mobility ?  ? ?Modified Rankin (Stroke Patients Only) ?  ? ? ?  ?Balance   ?  ?  ?  ?  ?  ?  ?  ?  ?  ?  ?  ?  ?  ?  ?  ?  ?  ?  ?  ? ?  ?Cognition Arousal/Alertness: Awake/alert ?Behavior During Therapy: Impulsive, WFL for tasks assessed/performed ?  ?  ?  ?  ?  ?  ?  ?  ?  ?  ?  ?  ?  ?  ?  ?  ?  ?  ?  ?  ? ?  ?Exercises Total Joint Exercises ?Ankle Circles/Pumps: PROM ?Quad Sets: AROM, Left, 10 reps ?Long Arc Quad: AROM, Left, 10 reps ? ?  ?General Comments   ?  ?  ? ?Pertinent Vitals/Pain Pain Assessment ?Pain Score: 3  ?Pain Location: L knee ?Pain Descriptors / Indicators: Operative site guarding, Cramping ?Pain Intervention(s): Monitored during session, Premedicated before session, Ice applied  ? ? ?Home Living   ?  ?  ?  ?  ?  ?  ?  ?  ?  ?   ?  ?Prior Function    ?  ?  ?   ? ?PT Goals (current goals can now be found in the care plan section) Progress towards PT goals: Progressing toward goals ? ?  ?Frequency ? ? ? 7X/week ? ? ? ?  ?PT Plan Current plan remains appropriate  ? ? ?Co-evaluation   ?  ?  ?  ?  ? ?  ?  AM-PAC PT "6 Clicks" Mobility   ?Outcome Measure ? Help needed turning from your back to your side while in a flat bed without using bedrails?: None ?Help needed moving from lying on your back to sitting on the side of a flat bed without using bedrails?: None ?Help needed moving to and from a bed to a chair (including a wheelchair)?: A Little ?Help needed standing up from a chair using your arms (e.g., wheelchair or bedside chair)?: A Little ?Help needed to walk in hospital room?: A Little ?Help needed climbing 3-5 steps with a railing? : A Little ?6 Click Score: 20 ? ?  ?End of Session Equipment Utilized During Treatment: Gait belt ?Activity Tolerance: Patient tolerated treatment well ?Patient left: in chair;with call bell/phone within reach;with chair alarm set ?Nurse Communication: Mobility status ?PT Visit Diagnosis: Difficulty  in walking, not elsewhere classified (R26.2) ?  ? ? ?Time: 0900-0930 ?PT Time Calculation (min) (ACUTE ONLY): 30 min ? ?Charges:  $Gait Training: 8-22 mins ?$Therapeutic Exercise: 8-22 mins          ?          ? ?Tresa Endo PT ?Acute Rehabilitation Services ?Pager [phone] ?Office [phone] ? ? ? ?Claretha Cooper ?05/07/2021, 3:41 PM ? ?

## 2021-05-07 NOTE — Anesthesia Postprocedure Evaluation (Signed)
Anesthesia Post Note ? ?Patient: Kenneth Pace ? ?Procedure(s) Performed: TOTAL KNEE ARTHROPLASTY (Left: Knee) ? ?  ? ?Patient location during evaluation: PACU ?Anesthesia Type: Spinal ?Level of consciousness: oriented and awake and alert ?Pain management: pain level controlled ?Vital Signs Assessment: post-procedure vital signs reviewed and stable ?Respiratory status: spontaneous breathing, respiratory function stable and patient connected to nasal cannula oxygen ?Cardiovascular status: blood pressure returned to baseline and stable ?Postop Assessment: no headache, no backache and no apparent nausea or vomiting ?Anesthetic complications: no ? ? ?No notable events documented. ? ?Last Vitals:  ?Vitals:  ? 05/07/21 0117 05/07/21 0610  ?BP: 131/81 (!) 143/78  ?Pulse: 99 93  ?Resp: 18 18  ?Temp: 37 ?C 36.8 ?C  ?SpO2: 95% 100%  ?  ?Last Pain:  ?Vitals:  ? 05/07/21 0723  ?TempSrc:   ?PainSc: 0-No pain  ? ? ?  ?  ?  ?  ?  ?  ? ?Riku Buttery S ? ? ? ? ?

## 2021-05-07 NOTE — Progress Notes (Signed)
Inpatient Diabetes Program Recommendations ? ?AACE/ADA: New Consensus Statement on Inpatient Glycemic Control (2015) ? ?Target Ranges:  Prepandial:   less than 140 mg/dL ?     Peak postprandial:   less than 180 mg/dL (1-2 hours) ?     Critically ill patients:  140 - 180 mg/dL  ? ?Lab Results  ?Component Value Date  ? GLUCAP 115 (H) 05/07/2021  ? HGBA1C 7.4 (A) 03/13/2021  ? ? ?Review of Glycemic Control ? ?Diabetes history: DM1 ?Outpatient Diabetes medications: Insulin pump ?Current orders for Inpatient glycemic control: Insulin pump ? ?Inpatient Diabetes Program Recommendations:   ?Patient has insulin pump on. ?Please enter insulin pump order set. Discussed with RN Carolin Sicks. ? ?Thank you, ?Nani Gasser Deigo Alonso, RN, MSN, CDE  ?Diabetes Coordinator ?Inpatient Glycemic Control Team ?Team Pager [phone] (8am-5pm) ?05/07/2021 10:50 AM ? ? ? ?

## 2021-05-07 NOTE — Progress Notes (Signed)
Physical Therapy Treatment ?Patient Details ?Name: Kenneth Pace ?MRN: [id number] ?DOB: 07/14/1950 ?Today's Date: 05/07/2021 ? ? ?History of Present Illness Pt is a 71yo male presenting s/p L TKA on 05/06/21. PMH: anxiety, BPH, chronic inflammatory demyelinating polyneuropathy, DM, hyperlipidmia, HTN, x5 knee arthroscopies (2 L, 3 R), ACDF 2018, lumbar fusion L4-5 2022. ? ?  ?PT Comments  ? ? Wife present to practice steps as performed previously PT goals met   ?Recommendations for follow up therapy are one component of a multi-disciplinary discharge planning process, led by the attending physician.  Recommendations may be updated based on patient status, additional functional criteria and insurance authorization. ? ?Follow Up Recommendations ? Follow physician's recommendations for discharge plan and follow up therapies ?  ?  ?Assistance Recommended at Discharge Intermittent Supervision/Assistance  ?Patient can return home with the following A little help with walking and/or transfers;A little help with bathing/dressing/bathroom;Assistance with cooking/housework;Assist for transportation;Help with stairs or ramp for entrance ?  ?Equipment Recommendations ? None recommended by PT  ?  ?Recommendations for Other Services   ? ? ?  ?Precautions / Restrictions Precautions ?Precaution Comments: history of falls  ?  ? ?Mobility ? Bed Mobility ?  ?  ?  ?General bed mobility comments: in recliner ?  ? ?Transfers ?  ?Equipment used: Rolling walker (2 wheels) ?Transfers: Sit to/from Stand ?Sit to Stand: Min guard ?  ?  ?  ?  ?  ?General transfer comment: Pt min guard for safety and line management only, ?  ? ?Ambulation/Gait ?Ambulation/Gait assistance: Min guard ?Gait Distance (Feet): 50 Feet ?Assistive device: Rolling walker (2 wheels) ?Gait Pattern/deviations: Step-through pattern, Step-to pattern, Antalgic ?  ?  ?  ?General Gait Details: cues for safe use of RW, patient impulsive at time ? ? ?Stairs ?Stairs: Yes ?Stairs  assistance: Min assist ?Stair Management: One rail Left, Forwards, With walker, With cane ?  ?General stair comments: patient practiced forward  with right cane and wall goiing up and RW going down, performed x 2 ? ? ?Wheelchair Mobility ?  ? ?Modified Rankin (Stroke Patients Only) ?  ? ? ?  ?Balance   ?  ?  ?  ?  ?  ?  ?  ?  ?  ?  ?  ?  ?  ?  ?  ?  ?  ?  ?  ? ?  ?Cognition Arousal/Alertness: Awake/alert ?Behavior During Therapy: Impulsive, WFL for tasks assessed/performed ?  ?  ?  ?  ?  ?  ?  ?  ?  ?  ?  ?  ?  ?  ?  ?  ?  ?  ?  ?  ? ?  ?Exercises   ?General Comments   ?  ?  ? ?Pertinent Vitals/Pain Pain Assessment ?Pain Score: 2  ?Pain Location: L knee ?Pain Descriptors / Indicators: Operative site guarding, Cramping ?Pain Intervention(s): Monitored during session, Premedicated before session, Ice applied  ? ? ?Home Living   ?  ?  ?  ?  ?  ?  ?  ?  ?  ?   ?  ?Prior Function    ?  ?  ?   ? ?PT Goals (current goals can now be found in the care plan section) Progress towards PT goals: Progressing toward goals ? ?  ?Frequency ? ? ? 7X/week ? ? ? ?  ?PT Plan Current plan remains appropriate  ? ? ?Co-evaluation   ?  ?  ?  ?  ? ?  ?  AM-PAC PT "6 Clicks" Mobility   ?Outcome Measure ? Help needed turning from your back to your side while in a flat bed without using bedrails?: None ?Help needed moving from lying on your back to sitting on the side of a flat bed without using bedrails?: None ?Help needed moving to and from a bed to a chair (including a wheelchair)?: A Little ?Help needed standing up from a chair using your arms (e.g., wheelchair or bedside chair)?: A Little ?Help needed to walk in hospital room?: A Little ?Help needed climbing 3-5 steps with a railing? : A Little ?6 Click Score: 20 ? ?  ?End of Session Equipment Utilized During Treatment: Gait belt ?Activity Tolerance: Patient tolerated treatment well ?Patient left: in chair;with call bell/phone within reach;with chair alarm set ?Nurse Communication: Mobility  status ?PT Visit Diagnosis: Difficulty in walking, not elsewhere classified (R26.2) ?  ? ? ?Time: 2993-7169 ?PT Time Calculation (min) (ACUTE ONLY): 15 min ? ?Charges:  gait training 8-22          ?          ? ? ? ?Claretha Cooper ?05/07/2021, 3:45 PM ? ?

## 2021-05-07 NOTE — Plan of Care (Signed)
Discharge instructions given to the patient including medications. ?

## 2021-05-07 NOTE — Progress Notes (Signed)
? ?Subjective: ?1 Day Post-Op Procedure(s) (LRB): ?TOTAL KNEE ARTHROPLASTY (Left) ?Patient reports pain as mild.   ?Patient seen in rounds with Dr. Alvan Dame. ?Patient is well, and has had no acute complaints or problems. No acute events overnight. Foley catheter removed. Patient moved with PT yesterday. ?We will start therapy today.  ? ?Objective: ?Vital signs in last 24 hours: ?Temp:  [97.5 ?F (36.4 ?C)-98.7 ?F (37.1 ?C)] 98.3 ?F (36.8 ?C) (03/22 2409) ?Pulse Rate:  [71-110] 93 (03/22 0610) ?Resp:  [10-20] 18 (03/22 0610) ?BP: (94-179)/(64-93) 143/78 (03/22 0610) ?SpO2:  [95 %-100 %] 100 % (03/22 0610) ?Weight:  [104.4 kg] 104.4 kg (03/21 1628) ? ?Intake/Output from previous day: ? ?Intake/Output Summary (Last 24 hours) at 05/07/2021 0803 ?Last data filed at 05/07/2021 7353 ?Gross per 24 hour  ?Intake 3561.12 ml  ?Output 4675 ml  ?Net -1113.88 ml  ?  ? ?Intake/Output this shift: ?No intake/output data recorded. ? ?Labs: ?Recent Labs  ?  05/07/21 ?0319  ?HGB 13.3  ? ?Recent Labs  ?  05/07/21 ?0319  ?WBC 17.1*  ?RBC 4.47  ?HCT 38.7*  ?PLT 225  ? ?Recent Labs  ?  05/07/21 ?0319  ?NA 133*  ?K 3.8  ?CL 102  ?CO2 24  ?BUN 26*  ?CREATININE 0.73  ?GLUCOSE 126*  ?CALCIUM 8.5*  ? ?No results for input(s): LABPT, INR in the last 72 hours. ? ?Exam: ?General - Patient is Alert and Oriented ?Extremity - Neurologically intact ?Sensation intact distally ?Intact pulses distally ?Dorsiflexion/Plantar flexion intact ?Dressing - dressing C/D/I ?Motor Function - intact, moving foot and toes well on exam. Moving knee in bed well, and performs straight leg raise.  ? ?Past Medical History:  ?Diagnosis Date  ? ADENOIDECTOMY, HX OF 02/16/2007  ? Anxiety   ? pt. states he does not have anxiety.  ? BENIGN PROSTATIC HYPERTROPHY, WITH OBSTRUCTION 02/03/2010  ? Chronic inflammatory demyelinating polyneuropathy (HCC)   ? diagnosed 11/2016  ? DIABETES MELLITUS, TYPE I, UNCONTROLLED 12/29/2006  ? ERECTILE DYSFUNCTION 02/16/2007  ? Hearing loss   ? wears  hearing aids  ? HYPERLIPIDEMIA 02/16/2007  ? HYPERTENSION 02/16/2007  ? Nerve pain   ? per patient "on lower back and both legs"  ? Sleep apnea   ? uses CPAP  ? TESTICULAR MASS, LEFT 02/16/2007  ? Therapeutic opioid-induced constipation (OIC)   ? ? ?Assessment/Plan: ?1 Day Post-Op Procedure(s) (LRB): ?TOTAL KNEE ARTHROPLASTY (Left) ?Principal Problem: ?  S/P total knee arthroplasty, left ? ?Estimated body mass index is 31.22 kg/m? as calculated from the following: ?  Height as of this encounter: 6' (1.829 m). ?  Weight as of this encounter: 104.4 kg. ?Advance diet ?Up with therapy ?D/C IV fluids ? ? ?Patient's anticipated LOS is less than 2 midnights, meeting these requirements: ?- Younger than 53 ?- Lives within 1 hour of care ?- Has a competent adult at home to recover with post-op recover ?- NO history of ? - Chronic pain requiring opiods ? - Diabetes ? - Coronary Artery Disease ? - Heart failure ? - Heart attack ? - Stroke ? - DVT/VTE ? - Cardiac arrhythmia ? - Respiratory Failure/COPD ? - Renal failure ? - Anemia ? - Advanced Liver disease ? ?  ? ?DVT Prophylaxis - Aspirin ?Weight bearing as tolerated. ? ?Hgb stable at 13.3 this AM. ? ?Plan is to go Home after hospital stay. Plan for discharge today following 1-2 sessions of PT as long as they are meeting their goals. Patient is scheduled for  OPPT. Follow up in the office in 2 weeks.  ? ?Griffith Citron, PA-C ?Orthopedic Surgery ?[phone] ?05/07/2021, 8:03 AM  ?

## 2021-05-07 NOTE — Plan of Care (Signed)
  Problem: Health Behavior/Discharge Planning: Goal: Ability to manage health-related needs will improve Outcome: Progressing   Problem: Clinical Measurements: Goal: Ability to maintain clinical measurements within normal limits will improve Outcome: Progressing   Problem: Activity: Goal: Risk for activity intolerance will decrease Outcome: Progressing   Problem: Pain Managment: Goal: General experience of comfort will improve Outcome: Progressing   

## 2021-05-08 ENCOUNTER — Ambulatory Visit: Payer: Medicare Other | Attending: Orthopedic Surgery | Admitting: Rehabilitative and Restorative Service Providers"

## 2021-05-08 ENCOUNTER — Encounter: Payer: Self-pay | Admitting: Rehabilitative and Restorative Service Providers"

## 2021-05-08 ENCOUNTER — Other Ambulatory Visit: Payer: Self-pay

## 2021-05-08 DIAGNOSIS — R2681 Unsteadiness on feet: Secondary | ICD-10-CM | POA: Insufficient documentation

## 2021-05-08 DIAGNOSIS — R269 Unspecified abnormalities of gait and mobility: Secondary | ICD-10-CM | POA: Diagnosis present

## 2021-05-08 DIAGNOSIS — R2689 Other abnormalities of gait and mobility: Secondary | ICD-10-CM | POA: Insufficient documentation

## 2021-05-08 DIAGNOSIS — M25469 Effusion, unspecified knee: Secondary | ICD-10-CM | POA: Diagnosis present

## 2021-05-08 DIAGNOSIS — R531 Weakness: Secondary | ICD-10-CM | POA: Insufficient documentation

## 2021-05-08 DIAGNOSIS — M5440 Lumbago with sciatica, unspecified side: Secondary | ICD-10-CM | POA: Insufficient documentation

## 2021-05-08 DIAGNOSIS — M25562 Pain in left knee: Secondary | ICD-10-CM | POA: Diagnosis present

## 2021-05-08 DIAGNOSIS — M6281 Muscle weakness (generalized): Secondary | ICD-10-CM | POA: Diagnosis present

## 2021-05-08 NOTE — Patient Instructions (Signed)
?  Access Code: [email or web address] ?URL: [email or web address] ?Date: 05/08/2021 ?Prepared by: Gillermo Murdoch ? ?Exercises ?- Supine Quad Set  - 2 x daily - 7 x weekly - 1 sets - 10 reps - 3 sec  hold ?- Hooklying Hamstring Stretch with Strap  - 2 x daily - 7 x weekly - 1 sets - 3 reps - 30 sec  hold ?- Supine Heel Slide with Strap  - 2 x daily - 7 x weekly - 1 sets - 5-10 reps - 10 sec  hold ?- Seated Heel Slide  - 2 x daily - 7 x weekly - 1 sets - 3 reps - 30 sec  hold ? ?

## 2021-05-08 NOTE — Therapy (Signed)
?[facility] ?[address] ?[place], [place], [address] ?Phone: [phone]   Fax:  [phone] ? ?Physical Therapy Evaluation ? ?Patient Details  ?Name: Kenneth Pace ?MRN: [id number] ?Date of Birth: May 23, 1950 ?Referring Provider (PT): Dr Paralee Cancel ? ? ?Encounter Date: 05/08/2021 ? ? PT End of Session - 05/08/21 1058   ? ? Visit Number 1   ? Number of Visits 24   ? Date for PT Re-Evaluation 07/31/21   ? PT Start Time 1057   ? PT Stop Time 1145   ? PT Time Calculation (min) 48 min   ? Activity Tolerance Patient tolerated treatment well   ? ?  ?  ? ?  ? ? ?Past Medical History:  ?Diagnosis Date  ? ADENOIDECTOMY, HX OF 02/16/2007  ? Anxiety   ? pt. states he does not have anxiety.  ? BENIGN PROSTATIC HYPERTROPHY, WITH OBSTRUCTION 02/03/2010  ? Chronic inflammatory demyelinating polyneuropathy (HCC)   ? diagnosed 11/2016  ? DIABETES MELLITUS, TYPE I, UNCONTROLLED 12/29/2006  ? ERECTILE DYSFUNCTION 02/16/2007  ? Hearing loss   ? wears hearing aids  ? HYPERLIPIDEMIA 02/16/2007  ? HYPERTENSION 02/16/2007  ? Nerve pain   ? per patient "on lower back and both legs"  ? Sleep apnea   ? uses CPAP  ? TESTICULAR MASS, LEFT 02/16/2007  ? Therapeutic opioid-induced constipation (OIC)   ? ? ?Past Surgical History:  ?Procedure Laterality Date  ? ANTERIOR CERVICAL DECOMP/DISCECTOMY FUSION N/A 02/01/2017  ? Procedure: ANTERIOR CERVICAL DECOMPRESSION/DISCECTOMY FUSION CERVICAL THREE-FOUR , CERVICAL FOUR-FIVE  CERVICAL FIVE-SIX;  Surgeon: Consuella Lose, MD;  Location: [facility];  Service: Neurosurgery;  Laterality: N/A;  ? APPENDECTOMY    ? BACK SURGERY    ? COLONOSCOPY  02/05/2010  ? [facility]  ? EYE SURGERY Right   ? Dr. Katy Fitch  ? KNEE SURGERY    ? 2 Lknee arthroscopy, 3 R knee arthroscpoy  ? KNEE SURGERY Right 11/12/2016  ? LUMBAR FUSION  03/26/2020  ? LUMBAR LAMINECTOMY/DECOMPRESSION MICRODISCECTOMY Right 05/22/2019  ? Procedure: LAMINOTOMY AND MICRODISCECTOMY RIGHT  LUMBAR FOUR- LUMBAR FIVE;  Surgeon: Consuella Lose, MD;  Location: [facility];  Service: Neurosurgery;  Laterality: Right;  LAMINOTOMY AND MICRODISCECTOMY RIGHT LUMBAR FOUR- LUMBAR FIVE  ? LUMBAR LAMINECTOMY/DECOMPRESSION MICRODISCECTOMY Right 02/01/2020  ? Procedure: REDO MICRODISCECTOMY RT LUMBAR FOUR-FIVE;  Surgeon: Consuella Lose, MD;  Location: [facility];  Service: Neurosurgery;  Laterality: Right;  posterior  ? TONSILLECTOMY    ? 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 Procedure: TOTAL KNEE ARTHROPLASTY;  Surgeon: Paralee Cancel, MD;  Location: [facility];  Service: Orthopedics;  Laterality: Left;  ? ? ?There were no vitals filed for this visit. ? ? ? Subjective Assessment - 05/08/21 1101   ? ? Subjective Patient presents s/p Lt TKA 05/06/21 due to Lt knee pain with severe arthritis. Patient has done well with post op course with no complications.   ? Pertinent History AODM; IVIG for guillain barre; arthritis; multiple lumbar and cervical surgeries/fusions; sleep apnea   ? Patient Stated Goals walking with cane independently   ? Currently in Pain? Yes   ? Pain Score 8    ? Pain Location Knee   ? Pain Orientation Left   ? Pain Descriptors / Indicators Sore;Tightness   ? Pain Type Surgical pain   ? Pain Radiating Towards into thigh   ? Pain Onset In the past 7 days   ? Pain Frequency Intermittent   ? Aggravating Factors  moving leg;  walking; standing; transfers; rolling over in bed   ? Pain Relieving Factors meds; ice   ? ?  ?  ? ?  ? ? ? ? ? [facility] PT Assessment - 05/08/21 0001   ? ?  ? Assessment  ? Medical Diagnosis Lt TKA   ? Referring Provider (PT) Dr Paralee Cancel   ? Onset Date/Surgical Date 05/06/21   ? Hand Dominance Right   ? Next MD Visit 05/21/21   ? Prior Therapy here for LBP   ?  ? Precautions  ? Precautions None   ?  ? Restrictions  ? Weight Bearing Restrictions No   ?  ? Balance Screen  ? Has the patient fallen in the past 6 months Yes   ? How many times? several   ? Has the patient had a decrease in  activity level because of a fear of falling?  No   ? Is the patient reluctant to leave their home because of a fear of falling?  No   ?  ? Home Environment  ? Living Arrangements Spouse/significant other   ? Available Help at Discharge Family   ? Type of Home House   ? Home Access Stairs to enter   ? Entrance Stairs-Number of Steps 2   ? Home Layout Two level   shower on second level  ? Alternate Level Stairs-Number of Steps 17   ? Alternate Level Stairs-Rails --   can reach both sides  ? [place] - 2 wheels;Cane - single point;Shower seat;Bedside commode;Tub bench;Grab bars - toilet;Grab bars - tub/shower;Wheelchair - manual;Electric scooter   ?  ? Prior Function  ? Level of Independence Independent   ? Vocation Retired   ? Vocation Requirements auditor   ? Leisure exercise ona daily basis; gardening; care restoration; yard work   ?  ? Observation/Other Assessments  ? Focus on Therapeutic Outcomes (FOTO)  8   ?  ? Posture/Postural Control  ? Posture Comments flexed forward at hips and trunk   ?  ? AROM  ? Right Knee Extension -3   ? Right Knee Flexion 114   ? Left Knee Extension -26   ? Left Knee Flexion 93   ?  ? Strength  ? Overall Strength Comments strength assessed with pt in supine   ? Right Hip Flexion 4+/5   ? Right Hip Extension 4/5   ? Right Hip ABduction 4+/5   ? Left Hip Flexion 3/5   ? Left Hip Extension 3-/5   ? Left Hip ABduction 3-/5   ? Right Knee Flexion 4+/5   ? Right Knee Extension 4+/5   ?  ? Transfers  ? Comments independent with use of walker   ?  ? Ambulation/Gait  ? Ambulation/Gait Yes   ? Ambulation/Gait Assistance 5: Supervision   ? Ambulation Distance (Feet) 40 Feet   ? Assistive device Rolling walker   ? Gait Pattern Step-to pattern;Decreased stride length;Decreased hip/knee flexion - left;Left flexed knee in stance;Antalgic;Trunk flexed   ? Ambulation Surface Level   ? Gait velocity slowed   ? ?  ?  ? ?  ? ? ? ? ? ? ? ? ? ? ? ? ? ?Objective measurements completed on  examination: See above findings.  ? ? ? ? ? [place] Adult PT Treatment/Exercise - 05/08/21 0001   ? ?  ? Knee/Hip Exercises: Stretches  ? Passive Hamstring Stretch Left;2 reps;30 seconds   ? Passive Hamstring Stretch Limitations supine with strap   ?  Knee: Self-Stretch to increase Flexion Left;3 reps;10 seconds   ? Knee: Self-Stretch Limitations heel slide supine assisted with strap   ?  ? Knee/Hip Exercises: Standing  ? Other Standing Knee Exercises working on upright posture with increased Lt knee extension with walker   ?  ? Knee/Hip Exercises: Seated  ? Heel Slides AROM;Left;5 reps   5-10 sec hold  ? Heel Slides Limitations foot resting on pillowcase   ?  ? Knee/Hip Exercises: Supine  ? Quad Sets AROM;Strengthening;Left;2 sets;5 reps   5 sec hold  ? Quad Sets Limitations heel resting on elevated surface for one set   ?  ? Modalities  ? Modalities --   patient has "ice machine" at home will continue icing several times per day  ? ?  ?  ? ?  ? ? ? ? ? ? ? ? ? ? PT Education - 05/08/21 1135   ? ? Education Details HEP POC   ? Person(s) Educated Patient   ? Methods Explanation;Demonstration;Tactile cues;Verbal cues;Handout   ? Comprehension Verbalized understanding;Returned demonstration;Verbal cues required;Tactile cues required   ? ?  ?  ? ?  ? ? ? PT Short Term Goals - 05/08/21 1406   ? ?  ? PT SHORT TERM GOAL #1  ? Title Independent in initial HEP   ? Time 6   ? Period Weeks   ? Status New   ? Target Date 06/19/21   ?  ? PT SHORT TERM GOAL #2  ? Title Increase AAROMAROM Lt knee to -10 deg extension and 100 degrees flexion   ? Time 6   ? Period Weeks   ? Status New   ? Target Date 06/19/21   ?  ? PT SHORT TERM GOAL #3  ? Title Independent in ambulation with walking for functional, community distances   ? Time 6   ? Period Weeks   ? Status New   ? Target Date 06/19/21   ? ?  ?  ? ?  ? ? ? ? PT Long Term Goals - 05/08/21 1345   ? ?  ? PT LONG TERM GOAL #1  ? Title AROM Lt knee flexion 105 degrees and extension 0 to  (-)3 degrees   ? Baseline -   ? Time 12   ? Period Weeks   ? Status New   ? Target Date 07/31/21   ?  ? PT LONG TERM GOAL #2  ? Title The patient will improve LE strength to 4+/5 to 5-/5 hip flexion, extension and abdu

## 2021-05-12 ENCOUNTER — Ambulatory Visit: Payer: Medicare Other | Admitting: Physical Therapy

## 2021-05-12 ENCOUNTER — Other Ambulatory Visit: Payer: Self-pay

## 2021-05-12 ENCOUNTER — Encounter: Payer: Self-pay | Admitting: Physical Therapy

## 2021-05-12 DIAGNOSIS — M25469 Effusion, unspecified knee: Secondary | ICD-10-CM

## 2021-05-12 DIAGNOSIS — M6281 Muscle weakness (generalized): Secondary | ICD-10-CM

## 2021-05-12 DIAGNOSIS — M25562 Pain in left knee: Secondary | ICD-10-CM | POA: Diagnosis not present

## 2021-05-12 DIAGNOSIS — M5440 Lumbago with sciatica, unspecified side: Secondary | ICD-10-CM

## 2021-05-12 DIAGNOSIS — R269 Unspecified abnormalities of gait and mobility: Secondary | ICD-10-CM

## 2021-05-12 DIAGNOSIS — R2689 Other abnormalities of gait and mobility: Secondary | ICD-10-CM

## 2021-05-12 DIAGNOSIS — R2681 Unsteadiness on feet: Secondary | ICD-10-CM

## 2021-05-12 DIAGNOSIS — R531 Weakness: Secondary | ICD-10-CM

## 2021-05-12 NOTE — Therapy (Signed)
[facility] ?[facility] ?[address] ?[place], [place], [address] ?Phone: [phone]   Fax:  [phone] ? ?Physical Therapy Treatment ? ?Patient Details  ?Name: Kenneth Pace ?MRN: [id number] ?Date of Birth: 18-Dec-1950 ?Referring Provider (PT): Dr Paralee Cancel ? ? ?Encounter Date: 05/12/2021 ? ? PT End of Session - 05/12/21 1028   ? ? Visit Number 2   ? Number of Visits 24   ? Date for PT Re-Evaluation 07/31/21   ? PT Start Time 1017   ? PT Stop Time 0258   ? PT Time Calculation (min) 55 min   ? Activity Tolerance Patient limited by pain   ? ?  ?  ? ?  ? ? ?Past Medical History:  ?Diagnosis Date  ? ADENOIDECTOMY, HX OF 02/16/2007  ? Anxiety   ? pt. states he does not have anxiety.  ? BENIGN PROSTATIC HYPERTROPHY, WITH OBSTRUCTION 02/03/2010  ? Chronic inflammatory demyelinating polyneuropathy (HCC)   ? diagnosed 11/2016  ? DIABETES MELLITUS, TYPE I, UNCONTROLLED 12/29/2006  ? ERECTILE DYSFUNCTION 02/16/2007  ? Hearing loss   ? wears hearing aids  ? HYPERLIPIDEMIA 02/16/2007  ? HYPERTENSION 02/16/2007  ? Nerve pain   ? per patient "on lower back and both legs"  ? Sleep apnea   ? uses CPAP  ? TESTICULAR MASS, LEFT 02/16/2007  ? Therapeutic opioid-induced constipation (OIC)   ? ? ?Past Surgical History:  ?Procedure Laterality Date  ? ANTERIOR CERVICAL DECOMP/DISCECTOMY FUSION N/A 02/01/2017  ? Procedure: ANTERIOR CERVICAL DECOMPRESSION/DISCECTOMY FUSION CERVICAL THREE-FOUR , CERVICAL FOUR-FIVE  CERVICAL FIVE-SIX;  Surgeon: Consuella Lose, MD;  Location: [facility];  Service: Neurosurgery;  Laterality: N/A;  ? APPENDECTOMY    ? BACK SURGERY    ? COLONOSCOPY  02/05/2010  ? [facility]  ? EYE SURGERY Right   ? Dr. Katy Fitch  ? KNEE SURGERY    ? 2 Lknee arthroscopy, 3 R knee arthroscpoy  ? KNEE SURGERY Right 11/12/2016  ? LUMBAR FUSION  03/26/2020  ? LUMBAR LAMINECTOMY/DECOMPRESSION MICRODISCECTOMY Right 05/22/2019  ? Procedure: LAMINOTOMY AND MICRODISCECTOMY RIGHT LUMBAR FOUR-  LUMBAR FIVE;  Surgeon: Consuella Lose, MD;  Location: [facility];  Service: Neurosurgery;  Laterality: Right;  LAMINOTOMY AND MICRODISCECTOMY RIGHT LUMBAR FOUR- LUMBAR FIVE  ? LUMBAR LAMINECTOMY/DECOMPRESSION MICRODISCECTOMY Right 02/01/2020  ? Procedure: REDO MICRODISCECTOMY RT LUMBAR FOUR-FIVE;  Surgeon: Consuella Lose, MD;  Location: [facility];  Service: Neurosurgery;  Laterality: Right;  posterior  ? TONSILLECTOMY    ? 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 Procedure: TOTAL KNEE ARTHROPLASTY;  Surgeon: Paralee Cancel, MD;  Location: [facility];  Service: Orthopedics;  Laterality: Left;  ? ? ?There were no vitals filed for this visit. ? ? Subjective Assessment - 05/12/21 1028   ? ? Subjective Patient reporting 7-8/10 pain today. Not sleeping well.   ? Pertinent History AODM; IVIG for guillain barre; arthritis; multiple lumbar and cervical surgeries/fusions; sleep apnea   ? Patient Stated Goals walking with cane independently   ? Currently in Pain? Yes   ? Pain Score 8    ? Pain Location Knee   ? Pain Orientation Left;Medial   ? Pain Descriptors / Indicators Tightness;Dull;Sharp   ? Pain Type Surgical pain   ? ?  ?  ? ?  ? ? ? ? ? [facility] PT Assessment - 05/12/21 0001   ? ?  ? AROM  ? Left Knee Extension -18   ? Left Knee Flexion 90   ? ?  ?  ? ?  ? ? ? ? ? ? ? ? ? ? ? ? ? ? ? ?  [facility] Adult PT Treatment/Exercise - 05/12/21 0001   ? ?  ? Ambulation/Gait  ? Ambulation/Gait Yes   ? Ambulation/Gait Assistance 6: Modified independent (Device/Increase time)   ? Ambulation Distance (Feet) 40 Feet   ? Assistive device Rolling walker   ? Gait Pattern Step-through pattern;Decreased hip/knee flexion - left;Decreased weight shift to left;Poor foot clearance - left   ? Ambulation Surface Level   ?  ? Knee/Hip Exercises: Stretches  ? Knee: Self-Stretch to increase Flexion Left   ? Knee: Self-Stretch Limitations foot on 2 blue ovals x 10   ? Other Knee/Hip Stretches seated knee flexion stretch demo'd and returned by pt for HEP   ?  ?  Knee/Hip Exercises: Aerobic  ? Nustep L5 x 5 min   ?  ? Knee/Hip Exercises: Standing  ? Terminal Knee Extension Left;10 reps   ? Terminal Knee Extension Limitations into ball   ?  ? Knee/Hip Exercises: Supine  ? Quad Sets Strengthening;10 reps   ? Quad Sets Limitations heel on towel   ? Short Arc [employer]   different bolsters tried  ? Short Arc [employer] Limitations not tolerated today   ? Heel Slides Left;5 reps   ? Heel Slides Limitations not tolerated today   ? Straight Leg Raises AAROM;Left;10 reps   ? Straight Leg Raises Limitations unable to do without assist   ?  ? Modalities  ? Modalities Vasopneumatic   ?  ? Vasopneumatic  ? Number Minutes Vasopneumatic  15 minutes   ? Vasopnuematic Location  Knee   ? Vasopneumatic Pressure Medium   ? Vasopneumatic Temperature  34   ? ?  ?  ? ?  ? ? ? ? ? ? ? ? ? ? PT Education - 05/12/21 1252   ? ? Education Details HEP Progressed   ? Person(s) Educated Patient   ? Methods Explanation;Demonstration;Handout   ? Comprehension Verbalized understanding;Returned demonstration   ? ?  ?  ? ?  ? ? ? PT Short Term Goals - 05/08/21 1406   ? ?  ? PT SHORT TERM GOAL #1  ? Title Independent in initial HEP   ? Time 6   ? Period Weeks   ? Status New   ? Target Date 06/19/21   ?  ? PT SHORT TERM GOAL #2  ? Title Increase AAROMAROM Lt knee to -10 deg extension and 100 degrees flexion   ? Time 6   ? Period Weeks   ? Status New   ? Target Date 06/19/21   ?  ? PT SHORT TERM GOAL #3  ? Title Independent in ambulation with walking for functional, community distances   ? Time 6   ? Period Weeks   ? Status New   ? Target Date 06/19/21   ? ?  ?  ? ?  ? ? ? ? PT Long Term Goals - 05/08/21 1345   ? ?  ? PT LONG TERM GOAL #1  ? Title AROM Lt knee flexion 105 degrees and extension 0 to (-)3 degrees   ? Baseline -   ? Time 12   ? Period Weeks   ? Status New   ? Target Date 07/31/21   ?  ? PT LONG TERM GOAL #2  ? Title The patient will improve LE strength to 4+/5 to 5-/5 hip flexion,  extension and abduction; knee flexion/extension   ? Baseline -   ? Time 12   ? Period Weeks   ?  Status New   ? Target Date 07/31/21   ?  ? PT LONG TERM GOAL #3  ? Title Patient to demonstrate safe independent gait with assistive device as indicated for community distances   ? Baseline -   ? Time 12   ? Period Weeks   ? Status New   ? Target Date 07/31/21   ?  ? PT LONG TERM GOAL #4  ? Title Independent in [place] program as indicated   ? Baseline -   ? Time 12   ? Period Weeks   ? Status New   ? Target Date 07/31/21   ?  ? PT LONG TERM GOAL #5  ? Title Improve functional limitation score to 40   ? Baseline -   ? Time 12   ? Period Weeks   ? Status New   ? Target Date 07/31/21   ? ?  ?  ? ?  ? ? ? ? ? ? ? ? Plan - 05/12/21 1253   ? ? Clinical Impression Statement Marden Noble presents with significant pain today and he is not sleeping. His wife reported the MD had increased pain meds but have not picked up yet. He tolerated TE only fair today. Pain is mainly medially in the knee. His extension has improved but flexion was limited today by pain.   ? PT Frequency 2x / week   ? PT Duration 12 weeks   ? PT Treatment/Interventions ADLs/Self Care Home Management;Aquatic Therapy;Cryotherapy;Electrical Stimulation;Iontophoresis '4mg'$ /ml Dexamethasone;Moist Heat;Ultrasound;Gait training;Stair training;Functional mobility training;Therapeutic activities;Therapeutic exercise;Balance training;Neuromuscular re-education;Patient/family education;Manual techniques;Scar mobilization;Passive range of motion;Dry needling;Taping;Vasopneumatic Device   ? PT Next Visit Plan review and progress ROM and strengthening exercises Lt LE; focus on knee and hip extension; gait training; progress with knee rehaba as indicated   ? PT [place]   ? Consulted and Agree with Plan of Care Patient   ? ?  ?  ? ?  ? ? ?Patient will benefit from skilled therapeutic intervention in order to improve the following deficits and  impairments:  Abnormal gait, Decreased coordination, Decreased range of motion, Difficulty walking, Decreased activity tolerance, Pain, Decreased balance, Impaired flexibility, Decreased mobility, Decreased strength,

## 2021-05-12 NOTE — Patient Instructions (Signed)
Access Code: [email or web address] ?URL: [email or web address] ?Date: 05/12/2021 ?Prepared by: Almyra Free ? ?Exercises ?- Supine Quad Set  - 2 x daily - 7 x weekly - 1 sets - 10 reps - 3 sec  hold ?- Hooklying Hamstring Stretch with Strap  - 2 x daily - 7 x weekly - 1 sets - 3 reps - 30 sec  hold ?- Supine Heel Slide with Strap  - 2 x daily - 7 x weekly - 1 sets - 5-10 reps - 10 sec  hold ?- Seated Heel Slide  - 2 x daily - 7 x weekly - 1 sets - 3 reps - 30 sec  hold ?- Seated Knee Flexion Slide (Mirrored)  - 3 x daily - 7 x weekly - 1 sets - 3 reps - 30-60 hold ?- Standing Knee Flexion Stretch on Step  - 3 x daily - 7 x weekly - 2 sets - 10 reps ?- Standing Terminal Knee Extension at Wall with Ball (Mirrored)  - 2 x daily - 7 x weekly - 2 sets - 10 reps - 5 sec hold ?

## 2021-05-14 ENCOUNTER — Other Ambulatory Visit: Payer: Self-pay

## 2021-05-14 ENCOUNTER — Ambulatory Visit: Payer: Medicare Other | Admitting: Rehabilitative and Restorative Service Providers"

## 2021-05-14 ENCOUNTER — Encounter: Payer: Self-pay | Admitting: Rehabilitative and Restorative Service Providers"

## 2021-05-14 DIAGNOSIS — M25562 Pain in left knee: Secondary | ICD-10-CM

## 2021-05-14 DIAGNOSIS — R269 Unspecified abnormalities of gait and mobility: Secondary | ICD-10-CM

## 2021-05-14 DIAGNOSIS — M25469 Effusion, unspecified knee: Secondary | ICD-10-CM

## 2021-05-14 DIAGNOSIS — R531 Weakness: Secondary | ICD-10-CM

## 2021-05-14 MED ORDER — CELECOXIB 200 MG PO CAPS: 200.0000 mg | ORAL_CAPSULE | Freq: Two times a day (BID) | ORAL | 0 refills | Status: DC

## 2021-05-14 NOTE — Therapy (Signed)
[place] [facility] ?[facility] ?[place] ?[place], [place], [address] ?Phone: [phone]   Fax:  [phone] ? ?Physical Therapy Treatment ? ?Patient Details  ?Name: KEIONDRE COLEE ?MRN: [id number] ?Date of Birth: May 17, 1950 ?Referring Provider (PT): Dr Paralee Cancel ? ? ?Encounter Date: 05/14/2021 ? ? PT End of Session - 05/14/21 0935   ? ? Visit Number 3   ? Number of Visits 24   ? Date for PT Re-Evaluation 07/31/21   ? PT Start Time 0930   ? PT Stop Time 1022   ? PT Time Calculation (min) 52 min   ? Equipment Utilized During Treatment Gait belt   ? Activity Tolerance Patient limited by pain   ? ?  ?  ? ?  ? ? ?Past Medical History:  ?Diagnosis Date  ? ADENOIDECTOMY, HX OF 02/16/2007  ? Anxiety   ? pt. states he does not have anxiety.  ? BENIGN PROSTATIC HYPERTROPHY, WITH OBSTRUCTION 02/03/2010  ? Chronic inflammatory demyelinating polyneuropathy (HCC)   ? diagnosed 11/2016  ? DIABETES MELLITUS, TYPE I, UNCONTROLLED 12/29/2006  ? ERECTILE DYSFUNCTION 02/16/2007  ? Hearing loss   ? wears hearing aids  ? HYPERLIPIDEMIA 02/16/2007  ? HYPERTENSION 02/16/2007  ? Nerve pain   ? per patient "on lower back and both legs"  ? Sleep apnea   ? uses CPAP  ? TESTICULAR MASS, LEFT 02/16/2007  ? Therapeutic opioid-induced constipation (OIC)   ? ? ?Past Surgical History:  ?Procedure Laterality Date  ? ANTERIOR CERVICAL DECOMP/DISCECTOMY FUSION N/A 02/01/2017  ? Procedure: ANTERIOR CERVICAL DECOMPRESSION/DISCECTOMY FUSION CERVICAL THREE-FOUR , CERVICAL FOUR-FIVE  CERVICAL FIVE-SIX;  Surgeon: Consuella Lose, MD;  Location: [place] [facility];  Service: Neurosurgery;  Laterality: N/A;  ? APPENDECTOMY    ? BACK SURGERY    ? COLONOSCOPY  02/05/2010  ? [facility]SON  ? EYE SURGERY Right   ? Dr. Katy Fitch  ? KNEE SURGERY    ? 2 Lknee arthroscopy, 3 R knee arthroscpoy  ? KNEE SURGERY Right 11/12/2016  ? LUMBAR FUSION  03/26/2020  ? LUMBAR LAMINECTOMY/DECOMPRESSION MICRODISCECTOMY Right 05/22/2019  ? Procedure:  LAMINOTOMY AND MICRODISCECTOMY RIGHT LUMBAR FOUR- LUMBAR FIVE;  Surgeon: Consuella Lose, MD;  Location: [facility];  Service: Neurosurgery;  Laterality: Right;  LAMINOTOMY AND MICRODISCECTOMY RIGHT LUMBAR FOUR- LUMBAR FIVE  ? LUMBAR LAMINECTOMY/DECOMPRESSION MICRODISCECTOMY Right 02/01/2020  ? Procedure: REDO MICRODISCECTOMY RT LUMBAR FOUR-FIVE;  Surgeon: Consuella Lose, MD;  Location: [facility];  Service: Neurosurgery;  Laterality: Right;  posterior  ? TONSILLECTOMY    ? 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 Procedure: TOTAL KNEE ARTHROPLASTY;  Surgeon: Paralee Cancel, MD;  Location: [facility];  Service: Orthopedics;  Laterality: Left;  ? ? ?There were no vitals filed for this visit. ? ? Subjective Assessment - 05/14/21 0936   ? ? Subjective Terrible pain in the mornings when he awakens and gets out of bed. Has been in ice machine for several hours when he goes to bed then puts a velcro wrap on knee until the morning.   ? Currently in Pain? Yes   ? Pain Score 8    4/10 end of day  ? Pain Location Knee   ? Pain Orientation Left   ? Pain Descriptors / Indicators Tightness;Dull;Sharp   ? Pain Type Surgical pain;Chronic pain   ? Pain Onset 1 to 4 weeks ago   ? Pain Frequency Intermittent   ? ?  ?  ? ?  ? ? ? ? ? [facility] PT Assessment -  05/14/21 0001   ? ?  ? Assessment  ? Medical Diagnosis Lt TKA   ? Referring Provider (PT) Dr Paralee Cancel   ? Onset Date/Surgical Date 05/06/21   ? Hand Dominance Right   ? Next MD Visit 05/21/21   ? Prior Therapy here for LBP   ?  ? AROM  ? Left Knee Extension -10   ? Left Knee Flexion 95   ? ?  ?  ? ?  ? ? ? ? ? ? ? ? ? ? ? ? ? ? ? ? [facility] Adult PT Treatment/Exercise - 05/14/21 0001   ? ?  ? Ambulation/Gait  ? Ambulation/Gait Yes   ? Ambulation/Gait Assistance 6: Modified independent (Device/Increase time)   ? Ambulation Distance (Feet) 40 Feet   ? Assistive device Rolling walker   ? Gait Pattern Step-through pattern;Decreased hip/knee flexion - left;Decreased weight shift to left;Poor foot  clearance - left   ? Ambulation Surface Level   ?  ? Knee/Hip Exercises: Stretches  ? Passive Hamstring Stretch Left;3 reps;30 seconds   ? Passive Hamstring Stretch Limitations sitting heel on floor pt assisting with extension pressing on thigh   ? Knee: Self-Stretch to increase Flexion Left   ? Knee: Self-Stretch Limitations sitting on Nustep seat and sliding foot back - then scooting hips forward holding 10 sec x 10 reps   ? Other Knee/Hip Stretches supine knee flexion with PT assist   ?  ? Knee/Hip Exercises: Aerobic  ? Nustep L5 x 8 min pause at end range flexion and then extension to increase mobility   ?  ? Knee/Hip Exercises: Standing  ? Terminal Knee Extension Left;10 reps   ? Terminal Knee Extension Limitations 10 sec hold pressing into coregeous ball   ? Other Standing Knee Exercises working on upright posture with increased Lt knee extension with walker   ?  ? Knee/Hip Exercises: Seated  ? Sit to Sand 5 reps   from Nustep pushing up with UE's; slowly moving stand to sit without UE assist - working on good hip and knee extension in standing  ?  ? Knee/Hip Exercises: Supine  ? Quad Sets Strengthening;10 reps   ? Quad Sets Limitations pressing knee into large noodle first 5 unsupported second 5   ?  ? Modalities  ? Modalities Vasopneumatic   ?  ? Vasopneumatic  ? Number Minutes Vasopneumatic  10 minutes   ? Vasopnuematic Location  Knee   ? Vasopneumatic Pressure --   no pressure  ? Vasopneumatic Temperature  34   ?  ? Manual Therapy  ? Manual therapy comments gentle mobs tibia on femur in varying degrees of knee flexion to assist pt into knee flexion   ? ?  ?  ? ?  ? ? ? ? ? ? ? ? ? ? ? ? PT Short Term Goals - 05/08/21 1406   ? ?  ? PT SHORT TERM GOAL #1  ? Title Independent in initial HEP   ? Time 6   ? Period Weeks   ? Status New   ? Target Date 06/19/21   ?  ? PT SHORT TERM GOAL #2  ? Title Increase AAROMAROM Lt knee to -10 deg extension and 100 degrees flexion   ? Time 6   ? Period Weeks   ? Status New    ? Target Date 06/19/21   ?  ? PT SHORT TERM GOAL #3  ? Title Independent in ambulation with walking for functional,  community distances   ? Time 6   ? Period Weeks   ? Status New   ? Target Date 06/19/21   ? ?  ?  ? ?  ? ? ? ? PT Long Term Goals - 05/08/21 1345   ? ?  ? PT LONG TERM GOAL #1  ? Title AROM Lt knee flexion 105 degrees and extension 0 to (-)3 degrees   ? Baseline -   ? Time 12   ? Period Weeks   ? Status New   ? Target Date 07/31/21   ?  ? PT LONG TERM GOAL #2  ? Title The patient will improve LE strength to 4+/5 to 5-/5 hip flexion, extension and abduction; knee flexion/extension   ? Baseline -   ? Time 12   ? Period Weeks   ? Status New   ? Target Date 07/31/21   ?  ? PT LONG TERM GOAL #3  ? Title Patient to demonstrate safe independent gait with assistive device as indicated for community distances   ? Baseline -   ? Time 12   ? Period Weeks   ? Status New   ? Target Date 07/31/21   ?  ? PT LONG TERM GOAL #4  ? Title Independent in [place] program as indicated   ? Baseline -   ? Time 12   ? Period Weeks   ? Status New   ? Target Date 07/31/21   ?  ? PT LONG TERM GOAL #5  ? Title Improve functional limitation score to 40   ? Baseline -   ? Time 12   ? Period Weeks   ? Status New   ? Target Date 07/31/21   ? ?  ?  ? ?  ? ? ? ? ? ? ? ? Plan - 05/14/21 0938   ? ? Clinical Impression Statement Continued pain which is worse in the morning and limits exercise; medial knee and distal quadabove knee. Pain meds help manage pain. Continued with work on ROM; stretching; strengthening; gait. Note gains in knee extension and flexion   ? Rehab Potential Good   ? PT Frequency 2x / week   ? PT Duration 12 weeks   ? PT Treatment/Interventions ADLs/Self Care Home Management;Aquatic Therapy;Cryotherapy;Electrical Stimulation;Iontophoresis '4mg'$ /ml Dexamethasone;Moist Heat;Ultrasound;Gait training;Stair training;Functional mobility training;Therapeutic activities;Therapeutic exercise;Balance  training;Neuromuscular re-education;Patient/family education;Manual techniques;Scar mobilization;Passive range of motion;Dry needling;Taping;Vasopneumatic Device   ? PT Next Visit Plan review and progress ROM and stren

## 2021-05-14 NOTE — Telephone Encounter (Signed)
Patient's wife called to see if the Celebrex can be sent to [facility] in [place] due to it being cheaper. Celebrex cancelled at [facility] ?

## 2021-05-16 ENCOUNTER — Ambulatory Visit: Payer: Medicare Other | Admitting: Rehabilitative and Restorative Service Providers"

## 2021-05-16 ENCOUNTER — Encounter: Payer: Self-pay | Admitting: Rehabilitative and Restorative Service Providers"

## 2021-05-16 DIAGNOSIS — M25562 Pain in left knee: Secondary | ICD-10-CM

## 2021-05-16 DIAGNOSIS — R531 Weakness: Secondary | ICD-10-CM

## 2021-05-16 DIAGNOSIS — M25469 Effusion, unspecified knee: Secondary | ICD-10-CM

## 2021-05-16 DIAGNOSIS — R269 Unspecified abnormalities of gait and mobility: Secondary | ICD-10-CM

## 2021-05-16 NOTE — Therapy (Signed)
[place] ?[facility] ?[employer] ?[place], [place], [address] ?Phone: [phone]   Fax:  [phone] ? ?Physical Therapy Treatment ? ?Patient Details  ?Name: Kenneth Pace ?MRN: [id number] ?Date of Birth: 10-31-1950 ?Referring Provider (PT): Dr Paralee Cancel ? ? ?Encounter Date: 05/16/2021 ? ? PT End of Session - 05/16/21 1019   ? ? Visit Number 4   ? Number of Visits 24   ? Date for PT Re-Evaluation 07/31/21   ? PT Start Time 1015   ? PT Stop Time 1105   ? PT Time Calculation (min) 50 min   ? Activity Tolerance Patient tolerated treatment well   ? ?  ?  ? ?  ? ? ?Past Medical History:  ?Diagnosis Date  ? ADENOIDECTOMY, HX OF 02/16/2007  ? Anxiety   ? pt. states he does not have anxiety.  ? BENIGN PROSTATIC HYPERTROPHY, WITH OBSTRUCTION 02/03/2010  ? Chronic inflammatory demyelinating polyneuropathy (HCC)   ? diagnosed 11/2016  ? DIABETES MELLITUS, TYPE I, UNCONTROLLED 12/29/2006  ? ERECTILE DYSFUNCTION 02/16/2007  ? Hearing loss   ? wears hearing aids  ? HYPERLIPIDEMIA 02/16/2007  ? HYPERTENSION 02/16/2007  ? Nerve pain   ? per patient "on lower back and both legs"  ? Sleep apnea   ? uses CPAP  ? TESTICULAR MASS, LEFT 02/16/2007  ? Therapeutic opioid-induced constipation (OIC)   ? ? ?Past Surgical History:  ?Procedure Laterality Date  ? ANTERIOR CERVICAL DECOMP/DISCECTOMY FUSION N/A 02/01/2017  ? Procedure: ANTERIOR CERVICAL DECOMPRESSION/DISCECTOMY FUSION CERVICAL THREE-FOUR , CERVICAL FOUR-FIVE  CERVICAL FIVE-SIX;  Surgeon: Consuella Lose, MD;  Location: [facility];  Service: Neurosurgery;  Laterality: N/A;  ? APPENDECTOMY    ? BACK SURGERY    ? COLONOSCOPY  02/05/2010  ? [facility]SON  ? EYE SURGERY Right   ? Dr. Katy Fitch  ? KNEE SURGERY    ? 2 Lknee arthroscopy, 3 R knee arthroscpoy  ? KNEE SURGERY Right 11/12/2016  ? LUMBAR FUSION  03/26/2020  ? LUMBAR LAMINECTOMY/DECOMPRESSION MICRODISCECTOMY Right 05/22/2019  ? Procedure: LAMINOTOMY AND MICRODISCECTOMY RIGHT  LUMBAR FOUR- LUMBAR FIVE;  Surgeon: Consuella Lose, MD;  Location: [facility];  Service: Neurosurgery;  Laterality: Right;  LAMINOTOMY AND MICRODISCECTOMY RIGHT LUMBAR FOUR- LUMBAR FIVE  ? LUMBAR LAMINECTOMY/DECOMPRESSION MICRODISCECTOMY Right 02/01/2020  ? Procedure: REDO MICRODISCECTOMY RT LUMBAR FOUR-FIVE;  Surgeon: Consuella Lose, MD;  Location: [facility];  Service: Neurosurgery;  Laterality: Right;  posterior  ? TONSILLECTOMY    ? 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 Procedure: TOTAL KNEE ARTHROPLASTY;  Surgeon: Paralee Cancel, MD;  Location: [facility];  Service: Orthopedics;  Laterality: Left;  ? ? ?There were no vitals filed for this visit. ? ? Subjective Assessment - 05/16/21 1020   ? ? Subjective Overdid it Wednesday - with the combination of therapy in the clinic and what he did at home.   ? Currently in Pain? Yes   ? Pain Score 7    ? Pain Location Knee   ? Pain Orientation Left   ? Pain Descriptors / Indicators Tightness;Dull;Sharp   ? Pain Type Surgical pain;Chronic pain   ? ?  ?  ? ?  ? ? ? ? ? [facility] PT Assessment - 05/16/21 0001   ? ?  ? Assessment  ? Medical Diagnosis Lt TKA   ? Referring Provider (PT) Dr Paralee Cancel   ? Onset Date/Surgical Date 05/06/21   ? Hand Dominance Right   ? Next MD Visit 05/21/21   ? Prior  Therapy here for LBP   ? ?  ?  ? ?  ? ? ? ? ? ? ? ? ? ? ? ? ? ? ? ? [facility] Adult PT Treatment/Exercise - 05/16/21 0001   ? ?  ? Ambulation/Gait  ? Ambulation/Gait Yes   ? Ambulation/Gait Assistance 6: Modified independent (Device/Increase time)   ? Ambulation Distance (Feet) 40 Feet   ? Assistive device Rolling walker   ? Gait Pattern Step-through pattern;Decreased hip/knee flexion - left;Decreased weight shift to left;Poor foot clearance - left   ? Ambulation Surface Level   ? Gait Comments VC to increase knee extension with stance on Lt   ?  ? Knee/Hip Exercises: Stretches  ? Passive Hamstring Stretch Left;3 reps;30 seconds   ? Passive Hamstring Stretch Limitations sitting heel on floor pt  assisting with extension pressing on thigh   ? Knee: Self-Stretch to increase Flexion Left   ? Knee: Self-Stretch Limitations sitting on Nustep seat and sliding foot back - then scooting hips forward holding 10 sec x 10 reps   ? Other Knee/Hip Stretches supine knee flexion with PT assist   ?  ? Knee/Hip Exercises: Aerobic  ? Nustep L5 x 7 min pause at end range flexion and then extension to increase mobility last minute hold 10 sec in flex and ext   ?  ? Knee/Hip Exercises: Standing  ? Terminal Knee Extension Left;10 reps   ? Terminal Knee Extension Limitations 10 sec hold pressing into coregeous ball   ?  ? Knee/Hip Exercises: Seated  ? Sit to Sand 5 reps   from Nustep pushing up with UE's; slowly moving stand to sit with min UE assist - working on good hip and knee extension in standing  ?  ? Knee/Hip Exercises: Supine  ? Quad Sets Strengthening;10 reps   ? Quad Sets Limitations pressing posterior knee into large gree noodle first 5 then unsupported last 5 reps   ? Short [employer] AROM;Strengthening;Left;10 reps   ? Short [employer] Limitations green bolster through available range   ? Straight Leg Raises AROM;Strengthening;Left   ? Straight Leg Raises Limitations 4 reps 5 sec hold independent   ?  ? Modalities  ? Modalities Vasopneumatic   ?  ? Vasopneumatic  ? Number Minutes Vasopneumatic  10 minutes   ? Vasopnuematic Location  Knee   ? Vasopneumatic Pressure Low   ? Vasopneumatic Temperature  34   ?  ? Manual Therapy  ? Manual therapy comments gentle mobs tibia on femur in varying degrees of knee flexion to assist pt into knee flexion   ? ?  ?  ? ?  ? ? ? ? ? ? ? ? ? ? ? ? PT Short Term Goals - 05/08/21 1406   ? ?  ? PT SHORT TERM GOAL #1  ? Title Independent in initial HEP   ? Time 6   ? Period Weeks   ? Status New   ? Target Date 06/19/21   ?  ? PT SHORT TERM GOAL #2  ? Title Increase AAROMAROM Lt knee to -10 deg extension and 100 degrees flexion   ? Time 6   ? Period Weeks   ? Status New   ? Target  Date 06/19/21   ?  ? PT SHORT TERM GOAL #3  ? Title Independent in ambulation with walking for functional, community distances   ? Time 6   ? Period Weeks   ? Status New   ?  Target Date 06/19/21   ? ?  ?  ? ?  ? ? ? ? PT Long Term Goals - 05/08/21 1345   ? ?  ? PT LONG TERM GOAL #1  ? Title AROM Lt knee flexion 105 degrees and extension 0 to (-)3 degrees   ? Baseline -   ? Time 12   ? Period Weeks   ? Status New   ? Target Date 07/31/21   ?  ? PT LONG TERM GOAL #2  ? Title The patient will improve LE strength to 4+/5 to 5-/5 hip flexion, extension and abduction; knee flexion/extension   ? Baseline -   ? Time 12   ? Period Weeks   ? Status New   ? Target Date 07/31/21   ?  ? PT LONG TERM GOAL #3  ? Title Patient to demonstrate safe independent gait with assistive device as indicated for community distances   ? Baseline -   ? Time 12   ? Period Weeks   ? Status New   ? Target Date 07/31/21   ?  ? PT LONG TERM GOAL #4  ? Title Independent in [place] program as indicated   ? Baseline -   ? Time 12   ? Period Weeks   ? Status New   ? Target Date 07/31/21   ?  ? PT LONG TERM GOAL #5  ? Title Improve functional limitation score to 40   ? Baseline -   ? Time 12   ? Period Weeks   ? Status New   ? Target Date 07/31/21   ? ?  ?  ? ?  ? ? ? ? ? ? ? ? Plan - 05/16/21 1022   ? ? Clinical Impression Statement Patient has continued pain with movement and exercises. Good response to ice with decrease in pain intensity. Continued work on knee rehab including ROM, stretching, strengthening. Focus on knee extension. MD appointment 05/21/21 - note to MD at next visit (MD appt am, PT pm on 05/21/21)   ? Rehab Potential Good   ? PT Frequency 2x / week   ? PT Duration 12 weeks   ? PT Treatment/Interventions ADLs/Self Care Home Management;Aquatic Therapy;Cryotherapy;Electrical Stimulation;Iontophoresis '4mg'$ /ml Dexamethasone;Moist Heat;Ultrasound;Gait training;Stair training;Functional mobility training;Therapeutic  activities;Therapeutic exercise;Balance training;Neuromuscular re-education;Patient/family education;Manual techniques;Scar mobilization;Passive range of motion;Dry needling;Taping;Vasopneumatic Device   ? PT Next Visit Plan

## 2021-05-19 ENCOUNTER — Encounter: Payer: Self-pay | Admitting: Physical Therapy

## 2021-05-19 ENCOUNTER — Other Ambulatory Visit: Payer: Self-pay

## 2021-05-19 ENCOUNTER — Ambulatory Visit: Payer: Medicare Other | Attending: Orthopedic Surgery | Admitting: Physical Therapy

## 2021-05-19 DIAGNOSIS — R2681 Unsteadiness on feet: Secondary | ICD-10-CM | POA: Diagnosis present

## 2021-05-19 DIAGNOSIS — M5440 Lumbago with sciatica, unspecified side: Secondary | ICD-10-CM | POA: Insufficient documentation

## 2021-05-19 DIAGNOSIS — M25469 Effusion, unspecified knee: Secondary | ICD-10-CM | POA: Insufficient documentation

## 2021-05-19 DIAGNOSIS — R2689 Other abnormalities of gait and mobility: Secondary | ICD-10-CM | POA: Diagnosis present

## 2021-05-19 DIAGNOSIS — R269 Unspecified abnormalities of gait and mobility: Secondary | ICD-10-CM | POA: Diagnosis present

## 2021-05-19 DIAGNOSIS — M6281 Muscle weakness (generalized): Secondary | ICD-10-CM | POA: Insufficient documentation

## 2021-05-19 DIAGNOSIS — R531 Weakness: Secondary | ICD-10-CM | POA: Insufficient documentation

## 2021-05-19 DIAGNOSIS — M25562 Pain in left knee: Secondary | ICD-10-CM | POA: Diagnosis present

## 2021-05-19 DIAGNOSIS — E1042 Type 1 diabetes mellitus with diabetic polyneuropathy: Secondary | ICD-10-CM

## 2021-05-19 MED ORDER — ACCU-CHEK GUIDE ME W/DEVICE KIT: PACK | 0 refills | Status: DC

## 2021-05-19 NOTE — Therapy (Signed)
[facility] ?[address] ?[place], [place], [address] ?Phone: [phone]   Fax:  [phone] ? ?Physical Therapy Treatment ? ?Patient Details  ?Name: Kenneth Pace ?MRN: [id number] ?Date of Birth: 27-Oct-1950 ?Referring Provider (PT): Dr Paralee Cancel ? ? ?Encounter Date: 05/19/2021 ? ? PT End of Session - 05/19/21 1019   ? ? Visit Number 5   ? Number of Visits 24   ? Date for PT Re-Evaluation 07/31/21   ? PT Start Time 1017   ? PT Stop Time 1104   ? PT Time Calculation (min) 47 min   ? Activity Tolerance Patient tolerated treatment well;Patient limited by pain   ? Behavior During Therapy Impulsive;WFL for tasks assessed/performed   ? ?  ?  ? ?  ? ? ?Past Medical History:  ?Diagnosis Date  ? ADENOIDECTOMY, HX OF 02/16/2007  ? Anxiety   ? pt. states he does not have anxiety.  ? BENIGN PROSTATIC HYPERTROPHY, WITH OBSTRUCTION 02/03/2010  ? Chronic inflammatory demyelinating polyneuropathy (HCC)   ? diagnosed 11/2016  ? DIABETES MELLITUS, TYPE I, UNCONTROLLED 12/29/2006  ? ERECTILE DYSFUNCTION 02/16/2007  ? Hearing loss   ? wears hearing aids  ? HYPERLIPIDEMIA 02/16/2007  ? HYPERTENSION 02/16/2007  ? Nerve pain   ? per patient "on lower back and both legs"  ? Sleep apnea   ? uses CPAP  ? TESTICULAR MASS, LEFT 02/16/2007  ? Therapeutic opioid-induced constipation (OIC)   ? ? ?Past Surgical History:  ?Procedure Laterality Date  ? ANTERIOR CERVICAL DECOMP/DISCECTOMY FUSION N/A 02/01/2017  ? Procedure: ANTERIOR CERVICAL DECOMPRESSION/DISCECTOMY FUSION CERVICAL THREE-FOUR , CERVICAL FOUR-FIVE  CERVICAL FIVE-SIX;  Surgeon: Consuella Lose, MD;  Location: [facility];  Service: Neurosurgery;  Laterality: N/A;  ? APPENDECTOMY    ? BACK SURGERY    ? COLONOSCOPY  02/05/2010  ? [facility]  ? EYE SURGERY Right   ? Dr. Katy Fitch  ? KNEE SURGERY    ? 2 Lknee arthroscopy, 3 R knee arthroscpoy  ? KNEE SURGERY Right 11/12/2016  ? LUMBAR FUSION  03/26/2020  ? LUMBAR  LAMINECTOMY/DECOMPRESSION MICRODISCECTOMY Right 05/22/2019  ? Procedure: LAMINOTOMY AND MICRODISCECTOMY RIGHT LUMBAR FOUR- LUMBAR FIVE;  Surgeon: Consuella Lose, MD;  Location: [facility];  Service: Neurosurgery;  Laterality: Right;  LAMINOTOMY AND MICRODISCECTOMY RIGHT LUMBAR FOUR- LUMBAR FIVE  ? LUMBAR LAMINECTOMY/DECOMPRESSION MICRODISCECTOMY Right 02/01/2020  ? Procedure: REDO MICRODISCECTOMY RT LUMBAR FOUR-FIVE;  Surgeon: Consuella Lose, MD;  Location: [facility];  Service: Neurosurgery;  Laterality: Right;  posterior  ? TONSILLECTOMY    ? 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 Procedure: TOTAL KNEE ARTHROPLASTY;  Surgeon: Paralee Cancel, MD;  Location: [facility];  Service: Orthopedics;  Laterality: Left;  ? ? ?There were no vitals filed for this visit. ? ? Subjective Assessment - 05/19/21 1020   ? ? Subjective Pt reports a really bad day yesterday. He went out to his work shop and "may have twisted something".   ? Pertinent History AODM; IVIG for guillain barre; arthritis; multiple lumbar and cervical surgeries/fusions; sleep apnea   ? Patient Stated Goals walking with cane independently   ? Currently in Pain? Yes   ? Pain Score 8    8.5/10 today  ? Pain Location Knee   ? Pain Orientation Left   ? Pain Descriptors / Indicators Sharp;Sore   ? Pain Type Surgical pain;Chronic pain   ? ?  ?  ? ?  ? ? ? ? ? [facility] PT Assessment - 05/19/21 0001   ? ?  ?  AROM  ? AROM Assessment Site Knee   ? Left Knee Extension -10   approximate  ? Left Knee Flexion 95   ? ?  ?  ? ?  ? ? ? ? ? ? ? ? ? ? ? ? ? ? ? ? [facility] Adult PT Treatment/Exercise - 05/19/21 0001   ? ?  ? Ambulation/Gait  ? Ambulation/Gait Yes   ? Ambulation/Gait Assistance 6: Modified independent (Device/Increase time)   ? Ambulation Distance (Feet) 200 Feet   ? Assistive device Rolling walker   ? Gait Pattern Step-through pattern;Decreased step length - right;Decreased step length - left;Decreased stance time - left;Decreased hip/knee flexion - left;Decreased dorsiflexion  - left   ? Ambulation Surface Level   ? Gait Comments VC to increase heel strike and step length left   ?  ? Knee/Hip Exercises: Stretches  ? Knee: Self-Stretch to increase Flexion Left;10 seconds   ? Knee: Self-Stretch Limitations 10 reps on mat table sliding foot back - then scooting hips forward   ? Other Knee/Hip Stretches supine knee flexion with PT assist   ?  ? Knee/Hip Exercises: Aerobic  ? Stationary Bike partial revolutions x 7 min   ?  ? Knee/Hip Exercises: Standing  ? Terminal Knee Extension Left;10 reps   ? Terminal Knee Extension Limitations 10 sec hold pressing into coregeous ball   ?  ? Knee/Hip Exercises: Seated  ? Long Arc [employer]   ? Long [employer] Limitations pain   ? Sit to Sand 5 reps   from Nustep pushing up with UE's; slowly moving stand to sit with min UE assist - working on good hip and knee extension in standing  ?  ? Knee/Hip Exercises: Supine  ? Quad Sets 10 reps   ? Quad Sets Limitations pressing into folded pillow   ? Short Arc [employer] Strengthening;10 reps   ? Short Arc [employer] Limitations 5 sec hold; green bolster through available range   ? Straight Leg Raises AROM   ? Straight Leg Raises Limitations 1 rep; not tolerated today due to pain   ? Patellar Mobs not tolerated today   ?  ? Modalities  ? Modalities Vasopneumatic   ?  ? Vasopneumatic  ? Number Minutes Vasopneumatic  10 minutes   med not tolerated; held in extension stretch x 2 min  ? Vasopnuematic Location  Knee   ? Vasopneumatic Pressure Low   ? Vasopneumatic Temperature  34   ?  ? Manual Therapy  ? Manual therapy comments attempted patellar mobs but did not tolerate today   ? ?  ?  ? ?  ? ? ? ? ? ? ? ? ? ? ? ? PT Short Term Goals - 05/19/21 1321   ? ?  ? PT SHORT TERM GOAL #1  ? Title Independent in initial HEP   ? Status On-going   ?  ? PT SHORT TERM GOAL #2  ? Title Increase AAROMAROM Lt knee to -10 deg extension and 100 degrees flexion   ? Status On-going   ?  ? PT SHORT TERM GOAL #3  ? Title Independent in  ambulation with walking for functional, community distances   ? Status On-going   ? ?  ?  ? ?  ? ? ? ? PT Long Term Goals - 05/08/21 1345   ? ?  ? PT LONG TERM GOAL #1  ? Title AROM Lt knee flexion 105 degrees and extension 0 to (-)3  degrees   ? Baseline -   ? Time 12   ? Period Weeks   ? Status New   ? Target Date 07/31/21   ?  ? PT LONG TERM GOAL #2  ? Title The patient will improve LE strength to 4+/5 to 5-/5 hip flexion, extension and abduction; knee flexion/extension   ? Baseline -   ? Time 12   ? Period Weeks   ? Status New   ? Target Date 07/31/21   ?  ? PT LONG TERM GOAL #3  ? Title Patient to demonstrate safe independent gait with assistive device as indicated for community distances   ? Baseline -   ? Time 12   ? Period Weeks   ? Status New   ? Target Date 07/31/21   ?  ? PT LONG TERM GOAL #4  ? Title Independent in Page program as indicated   ? Baseline -   ? Time 12   ? Period Weeks   ? Status New   ? Target Date 07/31/21   ?  ? PT LONG TERM GOAL #5  ? Title Improve functional limitation score to 40   ? Baseline -   ? Time 12   ? Period Weeks   ? Status New   ? Target Date 07/31/21   ? ?  ?  ? ?  ? ? ? ? ? ? ? ? Plan - 05/19/21 1319   ? ? Clinical Impression Statement Marden Noble reported increased pain today after overdoing it yesterday. He was limited with TE and manual therapy. Left knee extension is still very limited affecting gait.   ? 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 PT Frequency 2x / week   ? PT Duration 12 weeks   ? PT Treatment/Interventions ADLs/Self Care Home Management;Aquatic Therapy;Cryotherapy;Electrical Stimulation;Iontophoresis '4mg'$ /ml Dexamethasone;Moist Heat;Ultrasound;Gait training;Stair training;Functional mobility training;Therapeutic activities;Therapeutic exercise;Balance training;Neuromuscular re-education;Patient/family education;Manual techniques;Scar mobilization;Passive range of motion;Dry  needling;Taping;Vasopneumatic Device   ? PT Next Visit Plan review and progress ROM and strengthening exercises Lt LE; focus on knee and hip extension; gait training; progress with knee rehaba as indicated; MD note   ? PT Home Exercise

## 2021-05-20 NOTE — Discharge Summary (Signed)
Patient ID: ?ALMER BUSHEY ?MRN: [id number] ?DOB/AGE: 69-Jun-1952 69 y.o. ? ?Admit date: 05/06/2021 ?Discharge date: 05/07/2021 ? ?Admission Diagnoses:  ?Left knee osteoarthritis ? ?Discharge Diagnoses:  ?Principal Problem: ?  S/P total knee arthroplasty, left ? ? ?Past Medical History:  ?Diagnosis Date  ? ADENOIDECTOMY, HX OF 02/16/2007  ? Anxiety   ? pt. states he does not have anxiety.  ? BENIGN PROSTATIC HYPERTROPHY, WITH OBSTRUCTION 02/03/2010  ? Chronic inflammatory demyelinating polyneuropathy (HCC)   ? diagnosed 11/2016  ? DIABETES MELLITUS, TYPE I, UNCONTROLLED 12/29/2006  ? ERECTILE DYSFUNCTION 02/16/2007  ? Hearing loss   ? wears hearing aids  ? HYPERLIPIDEMIA 02/16/2007  ? HYPERTENSION 02/16/2007  ? Nerve pain   ? per patient "on lower back and both legs"  ? Sleep apnea   ? uses CPAP  ? TESTICULAR MASS, LEFT 02/16/2007  ? Therapeutic opioid-induced constipation (OIC)   ? ? ?Surgeries: Procedure(s): ?TOTAL KNEE ARTHROPLASTY on 05/06/2021 ?  ?Consultants:  ? ?Discharged Condition: Improved ? ?Hospital Course: YAZEN ROSKO is an 71 y.o. male who was admitted 05/06/2021 for operative treatment ofS/P total knee arthroplasty, left. Patient has severe unremitting pain that affects sleep, daily activities, and work/hobbies. After pre-op clearance the patient was taken to the operating room on 05/06/2021 and underwent  Procedure(s): ?TOTAL KNEE ARTHROPLASTY.   ? ?Patient was given perioperative antibiotics:  ?Anti-infectives (From admission, onward)  ? ? Start     Dose/Rate Route Frequency Ordered Stop  ? 05/06/21 1830  ceFAZolin (ANCEF) IVPB 2g/100 mL premix       ? 2 g ?200 mL/hr over 30 Minutes Intravenous Every 6 hours 05/06/21 1626 05/07/21 1002  ? 05/06/21 1015  ceFAZolin (ANCEF) IVPB 2g/100 mL premix       ? 2 g ?200 mL/hr over 30 Minutes Intravenous On call to O.R. 05/06/21 1014 05/07/21 1327  ? ?  ?  ? ?Patient was given sequential compression devices, early ambulation, and chemoprophylaxis to  prevent DVT. Patient worked with PT and was meeting their goals regarding safe ambulation and transfers. ? ?Patient benefited maximally from hospital stay and there were no complications.   ? ?Recent vital signs: No data found.  ? ?Recent laboratory studies: No results for input(s): WBC, HGB, HCT, PLT, NA, K, CL, CO2, BUN, CREATININE, GLUCOSE, INR, CALCIUM in the last 72 hours. ? ?Invalid input(s): PT, 2 ? ? ?Discharge Medications:   ?Allergies as of 05/07/2021   ?No Known Allergies ?  ? ?  ?Medication List  ?  ? ?STOP taking these medications   ? ?traMADol 50 MG tablet ?Commonly known as: ULTRAM ?  ? ?  ? ?TAKE these medications   ? ?acetaminophen 500 MG tablet ?Commonly known as: TYLENOL ?Take 500 mg by mouth every 6 (six) hours as needed for mild pain. ?  ?amitriptyline 75 MG tablet ?Commonly known as: ELAVIL ?TAKE 1 TABLET BY MOUTH AT  BEDTIME ?  ?aspirin 81 MG chewable tablet ?Chew 1 tablet (81 mg total) by mouth 2 (two) times daily for 28 days. ?  ?B-COMPLEX/B-12 PO ?Take 1 tablet by mouth at bedtime. ?  ?clotrimazole-betamethasone cream ?Commonly known as: LOTRISONE ?Apply 1 application topically 2 (two) times daily as needed (apply to feet skin irritation). ?  ?Cranberry 400 MG Tabs ?Take 400 mg by mouth at bedtime. ?  ?docusate sodium 100 MG capsule ?Commonly known as: COLACE ?Take 1 capsule (100 mg total) by mouth 2 (two) times daily. ?  ?DULoxetine 60 MG capsule ?Commonly known  as: CYMBALTA ?TAKE 1 CAPSULE BY MOUTH  DAILY ?What changed: when to take this ?  ?finasteride 5 MG tablet ?Commonly known as: PROSCAR ?TAKE 1 TABLET BY MOUTH AT  BEDTIME ?  ?glucagon 1 MG injection ?Inject 1 mg into the vein once as needed. ?  ?glucose blood test strip ?Commonly known as: Visual merchandiser Next Test ?MEDICALLY NECESSARY FOR USE WITH PUMP; Use to check blood sugar 5 times per day and prn; E11.42 ?  ?Immune Globulin 10% 10G/145m (10,'000mg'$ /1060m Soln ?Generic drug: Immune Globulin 10% ?Inject 105 g into the vein every 6  (six) weeks. ?  ?insulin lispro 100 UNIT/ML injection ?Commonly known as: HUMALOG ?Inject into the skin. Via insulin pump ?  ?insulin pump Soln ?Inject 120 each into the skin daily. HUMALOG ?  ?lidocaine 5 % ?Commonly known as: LIDODERM ?At 7 am and remove at 7 pm. Apply 1 on each side. ?  ?lisinopril-hydrochlorothiazide 10-12.5 MG tablet ?Commonly known as: ZESTORETIC ?TAKE 1 TABLET BY MOUTH AT  BEDTIME ?  ?methocarbamol 750 MG tablet ?Commonly known as: ROBAXIN ?TAKE 1 TABLET BY MOUTH 3  TIMES DAILY AS NEEDED FOR  MUSCLE SPASM(S) ?  ?multivitamin with minerals Tabs tablet ?Take 1 tablet by mouth at bedtime. ?  ?oxyCODONE 5 MG immediate release tablet ?Commonly known as: Oxy IR/ROXICODONE ?Take 1-2 tablets (5-10 mg total) by mouth every 4 (four) hours as needed for severe pain. ?  ?PARADIGM RESERVOIR 3ML Misc ?1 Device by Does not apply route every 3 (three) days. ?  ?Quick-set Infusion 43" 42m41misc ?1 Device by Does not apply route every 3 (three) days. ?  ?polyethylene glycol 17 g packet ?Commonly known as: MIRALAX / GLYCOLAX ?Take 17 g by mouth daily as needed for mild constipation. ?  ?prochlorperazine 5 MG tablet ?Commonly known as: COMPAZINE ?Take 5 mg by mouth every 6 (six) hours as needed for vomiting or nausea. ?  ?pseudoephedrine 30 MG tablet ?Commonly known as: SUDAFED ?Take 30 mg by mouth at bedtime as needed for congestion. ?  ?scopolamine 1 MG/3DAYS ?Commonly known as: TRANSDERM-SCOP ?Place 1 patch (1.5 mg total) onto the skin every 3 (three) days. ?What changed:  ?when to take this ?reasons to take this ?  ? ?  ? ?  ?  ? ? ?  ?Discharge Care Instructions  ?(From admission, onward)  ?  ? ? ?  ? ?  Start     Ordered  ? 05/07/21 0000  Change dressing       ?Comments: Maintain surgical dressing until follow up in the clinic. If the edges start to pull up, may reinforce with tape. If the dressing is no longer working, may remove and cover with gauze and tape, but must keep the area dry and clean.  Call  with any questions or concerns.  ? 05/07/21 0807  ? ?  ?  ? ?  ? ? ?Diagnostic Studies: No results found. ? ?Disposition: Discharge disposition: 01-Home or Self Care ? ? ? ? ? ? ?Discharge Instructions   ? ? Call MD / Call 911   Complete by: As directed ?  ? If you experience chest pain or shortness of breath, CALL 911 and be transported to the hospital emergency room.  If you develope a fever above 101 F, pus (white drainage) or increased drainage or redness at the wound, or calf pain, call your surgeon's office.  ? Change dressing   Complete by: As directed ?  ? Maintain surgical dressing until  follow up in the clinic. If the edges start to pull up, may reinforce with tape. If the dressing is no longer working, may remove and cover with gauze and tape, but must keep the area dry and clean.  Call with any questions or concerns.  ? Constipation Prevention   Complete by: As directed ?  ? Drink plenty of fluids.  Prune juice may be helpful.  You may use a stool softener, such as Colace (over the counter) 100 mg twice a day.  Use MiraLax (over the counter) for constipation as needed.  ? Diet - low sodium heart healthy   Complete by: As directed ?  ? Increase activity slowly as tolerated   Complete by: As directed ?  ? Weight bearing as tolerated with assist device (walker, cane, etc) as directed, use it as long as suggested by your surgeon or therapist, typically at least 4-6 weeks.  ? Post-operative opioid taper instructions:   Complete by: As directed ?  ? POST-OPERATIVE OPIOID TAPER INSTRUCTIONS: ?It is important to wean off of your opioid medication as soon as possible. If you do not need pain medication after your surgery it is ok to stop day one. ?Opioids include: ?Codeine, Hydrocodone(Norco, Vicodin), Oxycodone(Percocet, oxycontin) and hydromorphone amongst others.  ?Long term and even short term use of opiods can cause: ?Increased pain response ?Dependence ?Constipation ?Depression ?Respiratory depression ?And  more.  ?Withdrawal symptoms can include ?Flu like symptoms ?Nausea, vomiting ?And more ?Techniques to manage these symptoms ?Hydrate well ?Eat regular healthy meals ?Stay active ?Use relaxation techniques(de

## 2021-05-21 ENCOUNTER — Encounter: Payer: Self-pay | Admitting: Rehabilitative and Restorative Service Providers"

## 2021-05-21 ENCOUNTER — Ambulatory Visit: Payer: Medicare Other | Admitting: Rehabilitative and Restorative Service Providers"

## 2021-05-21 DIAGNOSIS — M25562 Pain in left knee: Secondary | ICD-10-CM | POA: Diagnosis not present

## 2021-05-21 DIAGNOSIS — M6281 Muscle weakness (generalized): Secondary | ICD-10-CM

## 2021-05-21 DIAGNOSIS — R531 Weakness: Secondary | ICD-10-CM

## 2021-05-21 DIAGNOSIS — M25469 Effusion, unspecified knee: Secondary | ICD-10-CM

## 2021-05-21 DIAGNOSIS — R2689 Other abnormalities of gait and mobility: Secondary | ICD-10-CM

## 2021-05-21 DIAGNOSIS — R269 Unspecified abnormalities of gait and mobility: Secondary | ICD-10-CM

## 2021-05-21 NOTE — Therapy (Signed)
[place] ?[facility] ?[address] ?[place], [place], [address] ?Phone: [phone]   Fax:  [phone] ? ?Physical Therapy Treatment ? ?Patient Details  ?Name: Kenneth Pace ?MRN: [id number] ?Date of Birth: 1951/02/11 ?Referring Provider (PT): Dr Paralee Cancel ? ? ?Encounter Date: 05/21/2021 ? ? PT End of Session - 05/21/21 1437   ? ? Visit Number 6   ? Number of Visits 24   ? Date for PT Re-Evaluation 07/31/21   ? PT Start Time 1432   ? PT Stop Time 1512   ? PT Time Calculation (min) 40 min   ? Activity Tolerance Patient tolerated treatment well;Patient limited by pain   ? ?  ?  ? ?  ? ? ?Past Medical History:  ?Diagnosis Date  ? ADENOIDECTOMY, HX OF 02/16/2007  ? Anxiety   ? pt. states he does not have anxiety.  ? BENIGN PROSTATIC HYPERTROPHY, WITH OBSTRUCTION 02/03/2010  ? Chronic inflammatory demyelinating polyneuropathy (HCC)   ? diagnosed 11/2016  ? DIABETES MELLITUS, TYPE I, UNCONTROLLED 12/29/2006  ? ERECTILE DYSFUNCTION 02/16/2007  ? Hearing loss   ? wears hearing aids  ? HYPERLIPIDEMIA 02/16/2007  ? HYPERTENSION 02/16/2007  ? Nerve pain   ? per patient "on lower back and both legs"  ? Sleep apnea   ? uses CPAP  ? TESTICULAR MASS, LEFT 02/16/2007  ? Therapeutic opioid-induced constipation (OIC)   ? ? ?Past Surgical History:  ?Procedure Laterality Date  ? ANTERIOR CERVICAL DECOMP/DISCECTOMY FUSION N/A 02/01/2017  ? Procedure: ANTERIOR CERVICAL DECOMPRESSION/DISCECTOMY FUSION CERVICAL THREE-FOUR , CERVICAL FOUR-FIVE  CERVICAL FIVE-SIX;  Surgeon: Consuella Lose, MD;  Location: [facility];  Service: Neurosurgery;  Laterality: N/A;  ? APPENDECTOMY    ? BACK SURGERY    ? COLONOSCOPY  02/05/2010  ? [facility]  ? EYE SURGERY Right   ? Dr. Katy Fitch  ? KNEE SURGERY    ? 2 Lknee arthroscopy, 3 R knee arthroscpoy  ? KNEE SURGERY Right 11/12/2016  ? LUMBAR FUSION  03/26/2020  ? LUMBAR LAMINECTOMY/DECOMPRESSION MICRODISCECTOMY Right 05/22/2019  ? Procedure: LAMINOTOMY AND  MICRODISCECTOMY RIGHT LUMBAR FOUR- LUMBAR FIVE;  Surgeon: Consuella Lose, MD;  Location: [facility];  Service: Neurosurgery;  Laterality: Right;  LAMINOTOMY AND MICRODISCECTOMY RIGHT LUMBAR FOUR- LUMBAR FIVE  ? LUMBAR LAMINECTOMY/DECOMPRESSION MICRODISCECTOMY Right 02/01/2020  ? Procedure: REDO MICRODISCECTOMY RT LUMBAR FOUR-FIVE;  Surgeon: Consuella Lose, MD;  Location: [facility];  Service: Neurosurgery;  Laterality: Right;  posterior  ? TONSILLECTOMY    ? 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 Procedure: TOTAL KNEE ARTHROPLASTY;  Surgeon: Paralee Cancel, MD;  Location: [facility];  Service: Orthopedics;  Laterality: Left;  ? ? ?There were no vitals filed for this visit. ? ? Subjective Assessment - 05/21/21 1438   ? ? Subjective Patient reports that he was seen by PA this morning and she was pleased with his progress. Patient reports that he has some increased pain following the evaluation by the PA.   ? Currently in Pain? Yes   ? Pain Score 7    ? Pain Location Knee   ? Pain Orientation Left   ? Pain Descriptors / Indicators Dull;Aching;Sore   ? Pain Type Surgical pain;Chronic pain   ? ?  ?  ? ?  ? ? ? ? ? ? ? ? ? ? ? ? ? ? ? ? ? ? ? ? [facility] Adult PT Treatment/Exercise - 05/21/21 0001   ? ?  ? Knee/Hip Exercises: Stretches  ? Active Hamstring Stretch  Left;5 reps   ? Active Hamstring Stretch Limitations 5 sec hold heel propped on floor pt sitting   ? Other Knee/Hip Stretches supine knee flexion with PT assist   ?  ? Knee/Hip Exercises: Aerobic  ? Nustep L5 x 7 min pause at end range flexion and then extension to increase mobility last minute hold 10 sec in flex and ext   ?  ? Knee/Hip Exercises: Standing  ? Terminal Knee Extension Left;10 reps   ? Terminal Knee Extension Limitations 10 sec hold pressing into coregeous ball   ? Other Standing Knee Exercises working on upright posture with increased Lt knee extension with walker   ?  ? Knee/Hip Exercises: Seated  ? Sit to Sand 10 reps;with UE support   from Nustep  pushing up with UE's; slowly moving stand to sit with min UE assist - working on good hip and knee extension in standing  ?  ? Knee/Hip Exercises: Supine  ? Quad Sets Strengthening;Left;10 reps   ? Quad Sets Limitations pressing into pillow   ? Short Arc [employer] Strengthening;Left;10 reps   ? Short Arc [employer] Limitations 5 sec hold on green bolster - through available range   ?  ? Modalities  ? Modalities --   declined modalities  ? ?  ?  ? ?  ? ? ? ? ? ? ? ? ? ? ? ? PT Short Term Goals - 05/19/21 1321   ? ?  ? PT SHORT TERM GOAL #1  ? Title Independent in initial HEP   ? Status On-going   ?  ? PT SHORT TERM GOAL #2  ? Title Increase AAROMAROM Lt knee to -10 deg extension and 100 degrees flexion   ? Status On-going   ?  ? PT SHORT TERM GOAL #3  ? Title Independent in ambulation with walking for functional, community distances   ? Status On-going   ? ?  ?  ? ?  ? ? ? ? PT Long Term Goals - 05/08/21 1345   ? ?  ? PT LONG TERM GOAL #1  ? Title AROM Lt knee flexion 105 degrees and extension 0 to (-)3 degrees   ? Baseline -   ? Time 12   ? Period Weeks   ? Status New   ? Target Date 07/31/21   ?  ? PT LONG TERM GOAL #2  ? Title The patient will improve LE strength to 4+/5 to 5-/5 hip flexion, extension and abduction; knee flexion/extension   ? Baseline -   ? Time 12   ? Period Weeks   ? Status New   ? Target Date 07/31/21   ?  ? PT LONG TERM GOAL #3  ? Title Patient to demonstrate safe independent gait with assistive device as indicated for community distances   ? Baseline -   ? Time 12   ? Period Weeks   ? Status New   ? Target Date 07/31/21   ?  ? PT LONG TERM GOAL #4  ? Title Independent in [place] program as indicated   ? Baseline -   ? Time 12   ? Period Weeks   ? Status New   ? Target Date 07/31/21   ?  ? PT LONG TERM GOAL #5  ? Title Improve functional limitation score to 40   ? Baseline -   ? Time 12   ? Period Weeks   ? Status New   ? Target  Date 07/31/21   ? ?  ?  ? ?  ? ? ? ? ? ? ? ? Plan -  05/21/21 1440   ? ? Clinical Impression Statement PA please with [name]'s progress. Bandage removed from knee incision - incision dry and intact. He is to continue with therapy working on ROM and strength but was unable to go through full exercise program today due to pain. He declined vaso or ice stating that he would ice the knee when he got home.   ? Rehab Potential Good   ? PT Frequency 2x / week   ? PT Duration 12 weeks   ? PT Treatment/Interventions ADLs/Self Care Home Management;Aquatic Therapy;Cryotherapy;Electrical Stimulation;Iontophoresis '4mg'$ /ml Dexamethasone;Moist Heat;Ultrasound;Gait training;Stair training;Functional mobility training;Therapeutic activities;Therapeutic exercise;Balance training;Neuromuscular re-education;Patient/family education;Manual techniques;Scar mobilization;Passive range of motion;Dry needling;Taping;Vasopneumatic Device   ? PT Next Visit Plan review and progress ROM and strengthening exercises Lt LE; focus on knee and hip extension; gait training; progress with knee rehaba as indicated   ? PT [place]   ? Consulted and Agree with Plan of Care Patient   ? ?  ?  ? ?  ? ? ?Patient will benefit from skilled therapeutic intervention in order to improve the following deficits and impairments:    ? ?Visit Diagnosis: ?Acute pain of left knee ? ?Weakness generalized ? ?Abnormal gait ? ?Edema of knee ? ?Muscle weakness (generalized) ? ?Other abnormalities of gait and mobility ? ? ? ? ?Problem List ?Patient Active Problem List  ? Diagnosis Date Noted  ? 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 Lumbar herniated disc 02/01/2020  ? Lumbar radiculopathy 05/22/2019  ? Spastic gait 03/07/2019  ? Myalgia 11/01/2018  ? Chronic bilateral low back pain without sciatica 07/21/2018  ? 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 Edema 03/01/2017  ? Hypokalemia   ? Slow transit constipation   ?  Steroid-induced hyperglycemia   ? Urinary retention   ? Accidental drug overdose   ? Postoperative pain   ? Hyponatremia   ? Cervical myelopathy ([facility]) 02/01/2017  ? Shortness of breath at rest   ? Hypoglycemia   ? Spondylos

## 2021-05-23 ENCOUNTER — Ambulatory Visit: Payer: Medicare Other | Admitting: Physical Therapy

## 2021-05-23 DIAGNOSIS — R269 Unspecified abnormalities of gait and mobility: Secondary | ICD-10-CM

## 2021-05-23 DIAGNOSIS — M25469 Effusion, unspecified knee: Secondary | ICD-10-CM

## 2021-05-23 DIAGNOSIS — M25562 Pain in left knee: Secondary | ICD-10-CM

## 2021-05-23 DIAGNOSIS — R2689 Other abnormalities of gait and mobility: Secondary | ICD-10-CM

## 2021-05-23 DIAGNOSIS — M6281 Muscle weakness (generalized): Secondary | ICD-10-CM

## 2021-05-23 DIAGNOSIS — R531 Weakness: Secondary | ICD-10-CM

## 2021-05-23 NOTE — Therapy (Signed)
[facility] ?[facility] ?[address] ?[place], [place], [address] ?Phone: [phone]   Fax:  [phone] ? ?Physical Therapy Treatment ? ?Patient Details  ?Name: Kenneth Pace ?MRN: [id number] ?Date of Birth: 10/07/1950 ?Referring Provider (PT): Dr Paralee Cancel ? ? ?Encounter Date: 05/23/2021 ? ? PT End of Session - 05/23/21 1020   ? ? Visit Number 7   ? Number of Visits 24   ? Date for PT Re-Evaluation 07/31/21   ? PT Start Time 1020   ? PT Stop Time 1100   ? PT Time Calculation (min) 40 min   ? Activity Tolerance Patient tolerated treatment well;Patient limited by pain   ? ?  ?  ? ?  ? ? ?Past Medical History:  ?Diagnosis Date  ? ADENOIDECTOMY, HX OF 02/16/2007  ? Anxiety   ? pt. states he does not have anxiety.  ? BENIGN PROSTATIC HYPERTROPHY, WITH OBSTRUCTION 02/03/2010  ? Chronic inflammatory demyelinating polyneuropathy (HCC)   ? diagnosed 11/2016  ? DIABETES MELLITUS, TYPE I, UNCONTROLLED 12/29/2006  ? ERECTILE DYSFUNCTION 02/16/2007  ? Hearing loss   ? wears hearing aids  ? HYPERLIPIDEMIA 02/16/2007  ? HYPERTENSION 02/16/2007  ? Nerve pain   ? per patient "on lower back and both legs"  ? Sleep apnea   ? uses CPAP  ? TESTICULAR MASS, LEFT 02/16/2007  ? Therapeutic opioid-induced constipation (OIC)   ? ? ?Past Surgical History:  ?Procedure Laterality Date  ? ANTERIOR CERVICAL DECOMP/DISCECTOMY FUSION N/A 02/01/2017  ? Procedure: ANTERIOR CERVICAL DECOMPRESSION/DISCECTOMY FUSION CERVICAL THREE-FOUR , CERVICAL FOUR-FIVE  CERVICAL FIVE-SIX;  Surgeon: Consuella Lose, MD;  Location: [facility];  Service: Neurosurgery;  Laterality: N/A;  ? APPENDECTOMY    ? BACK SURGERY    ? COLONOSCOPY  02/05/2010  ? [place]  ? EYE SURGERY Right   ? Dr. Katy Fitch  ? KNEE SURGERY    ? 2 Lknee arthroscopy, 3 R knee arthroscpoy  ? KNEE SURGERY Right 11/12/2016  ? LUMBAR FUSION  03/26/2020  ? LUMBAR LAMINECTOMY/DECOMPRESSION MICRODISCECTOMY Right 05/22/2019  ? Procedure: LAMINOTOMY AND  MICRODISCECTOMY RIGHT LUMBAR FOUR- LUMBAR FIVE;  Surgeon: Consuella Lose, MD;  Location: [facility];  Service: Neurosurgery;  Laterality: Right;  LAMINOTOMY AND MICRODISCECTOMY RIGHT LUMBAR FOUR- LUMBAR FIVE  ? LUMBAR LAMINECTOMY/DECOMPRESSION MICRODISCECTOMY Right 02/01/2020  ? Procedure: REDO MICRODISCECTOMY RT LUMBAR FOUR-FIVE;  Surgeon: Consuella Lose, MD;  Location: [facility];  Service: Neurosurgery;  Laterality: Right;  posterior  ? TONSILLECTOMY    ? 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 Procedure: TOTAL KNEE ARTHROPLASTY;  Surgeon: Paralee Cancel, MD;  Location[facility];  Service: Orthopedics;  Laterality: Left;  ? ? ?There were no vitals filed for this visit. ? ? Subjective Assessment - 05/23/21 1021   ? ? Subjective Pt reports nothing new or different. States he tried to double up on his HEP since he was unable to tolerate a full session of therapy and felt very sore afterwards. Still feels some soreness now.   ? Currently in Pain? Yes   ? Pain Score 7    ? Pain Location Knee   ? Pain Orientation Left   ? ?  ?  ? ?  ? ? ? ? ? ? ? ? ? ? ? ? ? ? ? ? ? ? ? ? [facility] Adult PT Treatment/Exercise - 05/23/21 0001   ? ?  ? Knee/Hip Exercises: Stretches  ? Active Hamstring Stretch Left;20 seconds;2 reps   ?  ? Knee/Hip Exercises: Standing  ?  Terminal Knee Extension Left;10 reps   ? Terminal Knee Extension Limitations 10 sec hold pressing into coregeous ball   ? Hip Abduction Stengthening;Right;Left;2 sets;10 reps;Knee straight   ? Hip Extension Stengthening;Right;Left;2 sets;10 reps;Knee straight   ? Other Standing Knee Exercises weightshifting L<>R with focus on knee extension 2x10   ?  ? Knee/Hip Exercises: Seated  ? Long Arc Quad Left;10 reps;2 sets   ? Heel Slides AROM;Left;20 reps   ? Other Seated Knee/Hip Exercises heel raises 2x10   ? Marching Strengthening;Both;2 sets;10 reps   ?  ? Knee/Hip Exercises: Supine  ? Quad Sets Strengthening;Left;10 reps   ? Quad Sets Limitations presisng into coregeous ball   ?  ?  Manual Therapy  ? Manual Therapy Soft tissue mobilization   ? Manual therapy comments gentle grade I patellar mobs   ? Soft tissue mobilization gentle TPR and STM quads and ITB; tolerated better with self manual work using massage roller   ? ?  ?  ? ?  ? ? ? ? ? ? ? ? ? ? ? ? PT Short Term Goals - 05/19/21 1321   ? ?  ? PT SHORT TERM GOAL #1  ? Title Independent in initial HEP   ? Status On-going   ?  ? PT SHORT TERM GOAL #2  ? Title Increase AAROMAROM Lt knee to -10 deg extension and 100 degrees flexion   ? Status On-going   ?  ? PT SHORT TERM GOAL #3  ? Title Independent in ambulation with walking for functional, community distances   ? Status On-going   ? ?  ?  ? ?  ? ? ? ? PT Long Term Goals - 05/08/21 1345   ? ?  ? PT LONG TERM GOAL #1  ? Title AROM Lt knee flexion 105 degrees and extension 0 to (-)3 degrees   ? Baseline -   ? Time 12   ? Period Weeks   ? Status New   ? Target Date 07/31/21   ?  ? PT LONG TERM GOAL #2  ? Title The patient will improve LE strength to 4+/5 to 5-/5 hip flexion, extension and abduction; knee flexion/extension   ? Baseline -   ? Time 12   ? Period Weeks   ? Status New   ? Target Date 07/31/21   ?  ? PT LONG TERM GOAL #3  ? Title Patient to demonstrate safe independent gait with assistive device as indicated for community distances   ? Baseline -   ? Time 12   ? Period Weeks   ? Status New   ? Target Date 07/31/21   ?  ? PT LONG TERM GOAL #4  ? Title Independent in [place] program as indicated   ? Baseline -   ? Time 12   ? Period Weeks   ? Status New   ? Target Date 07/31/21   ?  ? PT LONG TERM GOAL #5  ? Title Improve functional limitation score to 40   ? Baseline -   ? Time 12   ? Period Weeks   ? Status New   ? Target Date 07/31/21   ? ?  ?  ? ?  ? ? ? ? ? ? ? ? Plan - 05/23/21 1046   ? ? Clinical Impression Statement Session continues to work on improving ROM and strength. Initiated hip and ankle strengthening. Performed in between knee strengthening exercises to  improve pt  tolerance. Attempted gentle manual work for quad and patellar mobs -- pt better able to tolerate when he performed them for himself.   ? Personal Factors and Comorbidities Comorbidity 1;Comorbidity 2;Comorbidity 3+   ? 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 Examination-Activity Limitations Bathing;Locomotion Level;Bed Mobility;Transfers;Bend;Carry;Dressing;Hygiene/Grooming;Lift;Sit;Sleep;Squat;Stairs;Stand;Toileting   ? Examination-Participation Restrictions Community Activity;Yard Work;Shop;Other   ? Rehab Potential Good   ? PT Frequency 2x / week   ? PT Duration 12 weeks   ? PT Treatment/Interventions ADLs/Self Care Home Management;Aquatic Therapy;Cryotherapy;Electrical Stimulation;Iontophoresis '4mg'$ /ml Dexamethasone;Moist Heat;Ultrasound;Gait training;Stair training;Functional mobility training;Therapeutic activities;Therapeutic exercise;Balance training;Neuromuscular re-education;Patient/family education;Manual techniques;Scar mobilization;Passive range of motion;Dry needling;Taping;Vasopneumatic Device   ? PT Next Visit Plan review and progress ROM and strengthening exercises Lt LE; focus on knee and hip extension; gait training; progress with knee rehaba as indicated   ? PT [place]   ? Consulted and Agree with Plan of Care Patient   ? ?  ?  ? ?  ? ? ?Patient will benefit from skilled therapeutic intervention in order to improve the following deficits and impairments:  Abnormal gait, Decreased coordination, Decreased range of motion, Difficulty walking, Decreased activity tolerance, Pain, Decreased balance, Impaired flexibility, Decreased mobility, Decreased strength, Increased edema, Postural dysfunction ? ?Visit Diagnosis: ?Acute pain of left knee ? ?Weakness generalized ? ?Abnormal gait ? ?Edema of knee ? ?Muscle weakness (generalized) ? ?Other abnormalities of gait and mobility ? ? ? ? ?Problem List ?Patient Active Problem  List  ? Diagnosis Date Noted  ? 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 Lumbar herniated disc 02/01/2020  ? Lumbar radiculopathy 05/22/2019  ? Spastic gait 03/07/2019  ? Myalgia 11/01/2018  ? [place]

## 2021-05-26 ENCOUNTER — Encounter: Payer: Self-pay | Admitting: Physical Therapy

## 2021-05-26 ENCOUNTER — Ambulatory Visit: Payer: Medicare Other | Admitting: Physical Therapy

## 2021-05-26 DIAGNOSIS — R2689 Other abnormalities of gait and mobility: Secondary | ICD-10-CM

## 2021-05-26 DIAGNOSIS — R2681 Unsteadiness on feet: Secondary | ICD-10-CM

## 2021-05-26 DIAGNOSIS — M25562 Pain in left knee: Secondary | ICD-10-CM | POA: Diagnosis not present

## 2021-05-26 DIAGNOSIS — M5440 Lumbago with sciatica, unspecified side: Secondary | ICD-10-CM

## 2021-05-26 DIAGNOSIS — R531 Weakness: Secondary | ICD-10-CM

## 2021-05-26 DIAGNOSIS — M25469 Effusion, unspecified knee: Secondary | ICD-10-CM

## 2021-05-26 DIAGNOSIS — M6281 Muscle weakness (generalized): Secondary | ICD-10-CM

## 2021-05-26 DIAGNOSIS — R269 Unspecified abnormalities of gait and mobility: Secondary | ICD-10-CM

## 2021-05-26 NOTE — Therapy (Signed)
[facility] ?[place] ?[place], [place], [address] ?Phone: [phone]   Fax:  [phone] ? ?Physical Therapy Treatment ? ?Patient Details  ?Name: Kenneth Pace ?MRN: [id number] ?Date of Birth: 05/18/1950 ?Referring Provider (PT): Dr Paralee Cancel ? ? ?Encounter Date: 05/26/2021 ? ? PT End of Session - 05/26/21 1025   ? ? Visit Number 8   ? Number of Visits 24   ? Date for PT Re-Evaluation 07/31/21   ? PT Start Time 1020   ? PT Stop Time 7416   ? PT Time Calculation (min) 43 min   ? Equipment Utilized During Treatment Gait belt   ? Activity Tolerance Patient tolerated treatment well;Patient limited by pain   ? Behavior During Therapy [facility] for tasks assessed/performed   ? ?  ?  ? ?  ? ? ?Past Medical History:  ?Diagnosis Date  ? ADENOIDECTOMY, HX OF 02/16/2007  ? Anxiety   ? pt. states he does not have anxiety.  ? BENIGN PROSTATIC HYPERTROPHY, WITH OBSTRUCTION 02/03/2010  ? Chronic inflammatory demyelinating polyneuropathy (HCC)   ? diagnosed 11/2016  ? DIABETES MELLITUS, TYPE I, UNCONTROLLED 12/29/2006  ? ERECTILE DYSFUNCTION 02/16/2007  ? Hearing loss   ? wears hearing aids  ? HYPERLIPIDEMIA 02/16/2007  ? HYPERTENSION 02/16/2007  ? Nerve pain   ? per patient "on lower back and both legs"  ? Sleep apnea   ? uses CPAP  ? TESTICULAR MASS, LEFT 02/16/2007  ? Therapeutic opioid-induced constipation (OIC)   ? ? ?Past Surgical History:  ?Procedure Laterality Date  ? ANTERIOR CERVICAL DECOMP/DISCECTOMY FUSION N/A 02/01/2017  ? Procedure: ANTERIOR CERVICAL DECOMPRESSION/DISCECTOMY FUSION CERVICAL THREE-FOUR , CERVICAL FOUR-FIVE  CERVICAL FIVE-SIX;  Surgeon: Consuella Lose, MD;  Location: [facility];  Service: Neurosurgery;  Laterality: N/A;  ? APPENDECTOMY    ? BACK SURGERY    ? COLONOSCOPY  02/05/2010  ? [facility]SON  ? EYE SURGERY Right   ? Dr. Katy Fitch  ? KNEE SURGERY    ? 2 Lknee arthroscopy, 3 R knee arthroscpoy  ? KNEE SURGERY Right 11/12/2016  ? LUMBAR FUSION   03/26/2020  ? LUMBAR LAMINECTOMY/DECOMPRESSION MICRODISCECTOMY Right 05/22/2019  ? Procedure: LAMINOTOMY AND MICRODISCECTOMY RIGHT LUMBAR FOUR- LUMBAR FIVE;  Surgeon: Consuella Lose, MD;  Location: [facility];  Service: Neurosurgery;  Laterality: Right;  LAMINOTOMY AND MICRODISCECTOMY RIGHT LUMBAR FOUR- LUMBAR FIVE  ? LUMBAR LAMINECTOMY/DECOMPRESSION MICRODISCECTOMY Right 02/01/2020  ? Procedure: REDO MICRODISCECTOMY RT LUMBAR FOUR-FIVE;  Surgeon: Consuella Lose, MD;  Location: [facility];  Service: Neurosurgery;  Laterality: Right;  posterior  ? TONSILLECTOMY    ? 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 Procedure: TOTAL KNEE ARTHROPLASTY;  Surgeon: Paralee Cancel, MD;  Location: [facility];  Service: Orthopedics;  Laterality: Left;  ? ? ?There were no vitals filed for this visit. ? ? Subjective Assessment - 05/26/21 1026   ? ? Subjective My dog jumped on me this morning.   ? Pertinent History AODM; IVIG for guillain barre; arthritis; multiple lumbar and cervical surgeries/fusions; sleep apnea   ? Patient Stated Goals walking with cane independently   ? Currently in Pain? Yes   ? Pain Score 7    ? Pain Location Knee   ? Pain Orientation Left   ? Pain Descriptors / Indicators Aching;Dull   ? Pain Type Surgical pain;Chronic pain   ? ?  ?  ? ?  ? ? ? ? ? [facility] PT Assessment - 05/26/21 0001   ? ?  ? AROM  ?  AROM Assessment Site Knee   ? Left Knee Extension -13   ? Left Knee Flexion 100   ? ?  ?  ? ?  ? ? ? ? ? ? ? ? ? ? ? ? ? ? ? ? [facility] PT Treatment/Exercise - 05/26/21 0001   ? ?  ? Ambulation/Gait  ? Ambulation/Gait Yes   ? Ambulation/Gait Assistance 6: Modified independent (Device/Increase time)   ? Ambulation Distance (Feet) 200 Feet   ? Assistive device Straight cane   ? Gait Pattern Step-through pattern;Decreased stance time - left;Decreased hip/knee flexion - left;Antalgic   ? Ambulation Surface Level;Indoor   ? Gait velocity slow   ?  ? Knee/Hip Exercises: Stretches  ? Knee: Self-Stretch to increase Flexion Left;10  seconds;5 reps   ?  ? Knee/Hip Exercises: Aerobic  ? Nustep L5 x 7 min pause at end range flexion and then extension to increase mobility last minute hold 10 sec in flex and ext   ?  ? Knee/Hip Exercises: Standing  ? Terminal Knee Extension Left;15 reps   ? Terminal Knee Extension Limitations 5sec   ? Hip Abduction Stengthening;Left;20 reps;Knee straight   ? Abduction Limitations did not tolerated standing on left today   ? Hip Extension Stengthening;Left;20 reps;Knee straight   ? Extension Limitations did not tolerated standing on left today   ? Forward Step Up Left;10 reps;Hand Hold: 1;Step Height: 2"   ? Forward Step Up Limitations foam; pain   ? Other Standing Knee Exercises weightshifting L<>R with focus on knee extension 1x10 side to side and 1 x 10 in semi tandem stance   ?  ? Manual Therapy  ? Manual Therapy Soft tissue mobilization   ? Manual therapy comments patellar mobs inf/sup/med/lat   ? Soft tissue mobilization IASTM to left quads, ADDuctors and ITB   ? ?  ?  ? ?  ? ? ? ? ? ? ? ? ? ? ? ? PT Short Term Goals - 05/19/21 1321   ? ?  ? PT SHORT TERM GOAL #1  ? Title Independent in initial HEP   ? Status On-going   ?  ? PT SHORT TERM GOAL #2  ? Title Increase AAROMAROM Lt knee to -10 deg extension and 100 degrees flexion   ? Status On-going   ?  ? PT SHORT TERM GOAL #3  ? Title Independent in ambulation with walking for functional, community distances   ? Status On-going   ? ?  ?  ? ?  ? ? ? ? PT Long Term Goals - 05/08/21 1345   ? ?  ? PT LONG TERM GOAL #1  ? Title AROM Lt knee flexion 105 degrees and extension 0 to (-)3 degrees   ? Baseline -   ? Time 12   ? Period Weeks   ? Status New   ? Target Date 07/31/21   ?  ? PT LONG TERM GOAL #2  ? Title The patient will improve LE strength to 4+/5 to 5-/5 hip flexion, extension and abduction; knee flexion/extension   ? Baseline -   ? Time 12   ? Period Weeks   ? Status New   ? Target Date 07/31/21   ?  ? PT LONG TERM GOAL #3  ? Title Patient to demonstrate safe  independent gait with assistive device as indicated for community distances   ? Baseline -   ? Time 12   ? Period Weeks   ? Status New   ?  Target Date 07/31/21   ?  ? PT LONG TERM GOAL #4  ? Title Independent in [place] program as indicated   ? Baseline -   ? Time 12   ? Period Weeks   ? Status New   ? Target Date 07/31/21   ?  ? PT LONG TERM GOAL #5  ? Title Improve functional limitation score to 40   ? Baseline -   ? Time 12   ? Period Weeks   ? Status New   ? Target Date 07/31/21   ? ?  ?  ? ?  ? ? ? ? ? ? ? ? Plan - 05/26/21 1246   ? ? Clinical Impression Statement Marden Noble continues to report pain with WBing and was limited with standing TE on the left today. He was able to amb with SPC today with SBA. He is slow and deliberate with gait, still lacking extension and continues to report pain. Patella was very stiff today, but normalized with mobilzations. Flexion has improved to 100 deg but active extension was -13 deg. Patient leaving on trip Friday. HEP should be updated.   ? 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 PT Frequency 2x / week   ? PT Duration 12 weeks   ? PT Treatment/Interventions ADLs/Self Care Home Management;Aquatic Therapy;Cryotherapy;Electrical Stimulation;Iontophoresis '4mg'$ /ml Dexamethasone;Moist Heat;Ultrasound;Gait training;Stair training;Functional mobility training;Therapeutic activities;Therapeutic exercise;Balance training;Neuromuscular re-education;Patient/family education;Manual techniques;Scar mobilization;Passive range of motion;Dry needling;Taping;Vasopneumatic Device   ? PT Next Visit Plan review and progress ROM and strengthening exercises Lt LE; focus on knee and hip extension; gait training; progress with knee rehaba as indicated   ? Consulted and Agree with Plan of Care Patient   ? ?  ?  ? ?  ? ? ?Patient will benefit from skilled therapeutic intervention in order to improve the following deficits and impairments:   Abnormal gait, Decreased coordination, Decreased range of motion, Difficulty walking, Decreased activity tolerance, Pain, Decreased balance, Impaired flexibility, Decreased mobility, Decreased stren

## 2021-05-28 ENCOUNTER — Encounter: Payer: Self-pay | Admitting: Physical Therapy

## 2021-05-28 ENCOUNTER — Ambulatory Visit: Payer: Medicare Other | Admitting: Physical Therapy

## 2021-05-28 DIAGNOSIS — M25469 Effusion, unspecified knee: Secondary | ICD-10-CM

## 2021-05-28 DIAGNOSIS — R2689 Other abnormalities of gait and mobility: Secondary | ICD-10-CM

## 2021-05-28 DIAGNOSIS — M25562 Pain in left knee: Secondary | ICD-10-CM | POA: Diagnosis not present

## 2021-05-28 DIAGNOSIS — R269 Unspecified abnormalities of gait and mobility: Secondary | ICD-10-CM

## 2021-05-28 DIAGNOSIS — M6281 Muscle weakness (generalized): Secondary | ICD-10-CM

## 2021-05-28 DIAGNOSIS — R531 Weakness: Secondary | ICD-10-CM

## 2021-05-28 NOTE — Therapy (Signed)
[facility] ?[place] ?[place], [place], [address] ?Phone: [phone]   Fax:  [phone] ? ?Physical Therapy Treatment ? ?Patient Details  ?Name: Kenneth Pace ?MRN: [id number] ?Date of Birth: 11-Sep-1950 ?Referring Provider (PT): Dr Paralee Cancel ? ? ?Encounter Date: 05/28/2021 ? ? PT End of Session - 05/28/21 1020   ? ? Visit Number 9   ? Number of Visits 24   ? Date for PT Re-Evaluation 07/31/21   ? PT Start Time 1019   ? PT Stop Time 1059   ? PT Time Calculation (min) 40 min   ? Activity Tolerance Patient tolerated treatment well;Patient limited by pain   ? Behavior During Therapy [facility] for tasks assessed/performed   ? ?  ?  ? ?  ? ? ?Past Medical History:  ?Diagnosis Date  ? ADENOIDECTOMY, HX OF 02/16/2007  ? Anxiety   ? pt. states he does not have anxiety.  ? BENIGN PROSTATIC HYPERTROPHY, WITH OBSTRUCTION 02/03/2010  ? Chronic inflammatory demyelinating polyneuropathy (HCC)   ? diagnosed 11/2016  ? DIABETES MELLITUS, TYPE I, UNCONTROLLED 12/29/2006  ? ERECTILE DYSFUNCTION 02/16/2007  ? Hearing loss   ? wears hearing aids  ? HYPERLIPIDEMIA 02/16/2007  ? HYPERTENSION 02/16/2007  ? Nerve pain   ? per patient "on lower back and both legs"  ? Sleep apnea   ? uses CPAP  ? TESTICULAR MASS, LEFT 02/16/2007  ? Therapeutic opioid-induced constipation (OIC)   ? ? ?Past Surgical History:  ?Procedure Laterality Date  ? ANTERIOR CERVICAL DECOMP/DISCECTOMY FUSION N/A 02/01/2017  ? Procedure: ANTERIOR CERVICAL DECOMPRESSION/DISCECTOMY FUSION CERVICAL THREE-FOUR , CERVICAL FOUR-FIVE  CERVICAL FIVE-SIX;  Surgeon: Consuella Lose, MD;  Location: [facility];  Service: Neurosurgery;  Laterality: N/A;  ? APPENDECTOMY    ? BACK SURGERY    ? COLONOSCOPY  02/05/2010  ? [facility]  ? EYE SURGERY Right   ? Dr. Katy Fitch  ? KNEE SURGERY    ? 2 Lknee arthroscopy, 3 R knee arthroscpoy  ? KNEE SURGERY Right 11/12/2016  ? LUMBAR FUSION  03/26/2020  ? LUMBAR LAMINECTOMY/DECOMPRESSION  MICRODISCECTOMY Right 05/22/2019  ? Procedure: LAMINOTOMY AND MICRODISCECTOMY RIGHT LUMBAR FOUR- LUMBAR FIVE;  Surgeon: Consuella Lose, MD;  Location: [facility];  Service: Neurosurgery;  Laterality: Right;  LAMINOTOMY AND MICRODISCECTOMY RIGHT LUMBAR FOUR- LUMBAR FIVE  ? LUMBAR LAMINECTOMY/DECOMPRESSION MICRODISCECTOMY Right 02/01/2020  ? Procedure: REDO MICRODISCECTOMY RT LUMBAR FOUR-FIVE;  Surgeon: Consuella Lose, MD;  Location: [facility];  Service: Neurosurgery;  Laterality: Right;  posterior  ? TONSILLECTOMY    ? 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 Procedure: TOTAL KNEE ARTHROPLASTY;  Surgeon: Paralee Cancel, MD;  Location[facility];  Service: Orthopedics;  Laterality: Left;  ? ? ?There were no vitals filed for this visit. ? ? Subjective Assessment - 05/28/21 1022   ? ? Subjective Was sore after tissue work last visit, but knot feels better.   ? Pertinent History AODM; IVIG for guillain barre; arthritis; multiple lumbar and cervical surgeries/fusions; sleep apnea   ? Currently in Pain? Yes   ? Pain Score 5    ? Pain Location Knee   ? Pain Orientation Left   ? Pain Descriptors / Indicators Aching;Dull   ? Pain Type Surgical pain   ? ?  ?  ? ?  ? ? ? ? ? [facility] PT Assessment - 05/28/21 0001   ? ?  ? AROM  ? AROM Assessment Site Knee   ? Left Knee Extension 6   ?  Left Knee Flexion 100   ? ?  ?  ? ?  ? ? ? ? ? ? ? ? ? ? ? ? ? ? ? ? [facility] Adult PT Treatment/Exercise - 05/28/21 0001   ? ?  ? Knee/Hip Exercises: Stretches  ? Passive Hamstring Stretch Left;5 reps;30 seconds   ? Passive Hamstring Stretch Limitations prone with knee off EOB then with knee supported   ?  ? Knee/Hip Exercises: Aerobic  ? Nustep L5 x 7 min pause at end range flexion and then extension to increase mobility last minute hold 10 sec in flex and ext   ?  ? Knee/Hip Exercises: Supine  ? Quad [employer] reps   ? Quad Sets Limitations painful in ant knee   ? Short Arc [employer] 20 reps   ? Short Arc [employer] Limitations 5 sec hold on green bolster    ? Heel Slides Left;20 reps   ? Heel Slides Limitations with strap   ? Straight Leg Raises Limitations 4 reps; not tolerated due to pain   ? Patellar Mobs good med/lat; still limited sup/inf   ?  ? Knee/Hip Exercises: Sidelying  ? Other Sidelying Knee/Hip Exercises left hip flexion (SLR) x 20   ? ?  ?  ? ?  ? ? ? ? ? ? ? ? ? ? PT Education - 05/28/21 1752   ? ? Education Details HEP   ? Person(s) Educated Patient   ? Methods Explanation;Demonstration;Handout   ? Comprehension Verbalized understanding;Returned demonstration   ? ?  ?  ? ?  ? ? ? PT Short Term Goals - 05/28/21 1755   ? ?  ? PT SHORT TERM GOAL #2  ? Title Increase AAROMAROM Lt knee to -10 deg extension and 100 degrees flexion   ? Status Partially Met   ? ?  ?  ? ?  ? ? ? ? PT Long Term Goals - 05/08/21 1345   ? ?  ? PT LONG TERM GOAL #1  ? Title AROM Lt knee flexion 105 degrees and extension 0 to (-)3 degrees   ? Baseline -   ? Time 12   ? Period Weeks   ? Status New   ? Target Date 07/31/21   ?  ? PT LONG TERM GOAL #2  ? Title The patient will improve LE strength to 4+/5 to 5-/5 hip flexion, extension and abduction; knee flexion/extension   ? Baseline -   ? Time 12   ? Period Weeks   ? Status New   ? Target Date 07/31/21   ?  ? PT LONG TERM GOAL #3  ? Title Patient to demonstrate safe independent gait with assistive device as indicated for community distances   ? Baseline -   ? Time 12   ? Period Weeks   ? Status New   ? Target Date 07/31/21   ?  ? PT LONG TERM GOAL #4  ? Title Independent in [place] program as indicated   ? Baseline -   ? Time 12   ? Period Weeks   ? Status New   ? Target Date 07/31/21   ?  ? PT LONG TERM GOAL #5  ? Title Improve functional limitation score to 40   ? Baseline -   ? Time 12   ? Period Weeks   ? Status New   ? Target Date 07/31/21   ? ?  ?  ? ?  ? ? ? ? ? ? ? ?  Plan - 05/28/21 1752   ? ? Clinical Impression Statement We focused on knee extension today with positive results. Left AROM 6-100 deg today. Prone  knee hang issued for HEP along with SDLY hip flex (SLR) as pt doesn't tolerate supine due to pain. Patient on vacation for one week and will return next week.   ? 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 PT Treatment/Interventions ADLs/Self Care Home Management;Aquatic Therapy;Cryotherapy;Electrical Stimulation;Iontophoresis 61m/ml Dexamethasone;Moist Heat;Ultrasound;Gait training;Stair training;Functional mobility training;Therapeutic activities;Therapeutic exercise;Balance training;Neuromuscular re-education;Patient/family education;Manual techniques;Scar mobilization;Passive range of motion;Dry needling;Taping;Vasopneumatic Device   ? PT Next Visit Plan focus on knee and hip extension; gait training; progress with knee rehaba as indicated   ? PT [place]  ? Consulted and Agree with Plan of Care Patient   ? ?  ?  ? ?  ? ? ?Patient will benefit from skilled therapeutic intervention in order to improve the following deficits and impairments:  Abnormal gait, Decreased coordination, Decreased range of motion, Difficulty walking, Decreased activity tolerance, Pain, Decreased balance, Impaired flexibility, Decreased mobility, Decreased strength, Increased edema, Postural dysfunction ? ?Visit Diagnosis: ?Acute pain of left knee ? ?Weakness generalized ? ?Abnormal gait ? ?Edema of knee ? ?Muscle weakness (generalized) ? ?Other abnormalities of gait and mobility ? ? ? ? ?Problem List ?Patient Active Problem List  ? Diagnosis Date Noted  ? 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 Lumbar herniated disc 02/01/2020  ? Lumbar radiculopathy 05/22/2019  ? Spastic gait 03/07/2019  ? Myalgia 11/01/2018  ? Chronic bilateral low back pain without sciatica 07/21/2018  ? 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 CIDP (chronic inflammatory demyelinating polyneuropathy) ([place] 12/30/2017  ? Edema 03/01/2017  ? Hypokalemia   ? Slow transit constipation   ?  Steroid-induced hyperglycemia   ? Urinary retention   ? Accidental drug overdose   ? Postoperative pain   ? Hyponatremia   ? Cervical myelopathy ([facility] 02/01/2017  ? Shortness of breath at rest   ? Hypoglycemia

## 2021-05-28 NOTE — Patient Instructions (Signed)
Access Code: [email or web address] ?URL: [email or web address] ?Date: 05/28/2021 ?Prepared by: Almyra Free ? ?Exercises ?- Supine Quad Set  - 2 x daily - 7 x weekly - 1 sets - 10 reps - 3 sec  hold ?- Hooklying Hamstring Stretch with Strap  - 2 x daily - 7 x weekly - 1 sets - 3 reps - 30 sec  hold ?- Supine Heel Slide with Strap  - 2 x daily - 7 x weekly - 1 sets - 5-10 reps - 10 sec  hold ?- Seated Heel Slide  - 2 x daily - 7 x weekly - 1 sets - 3 reps - 30 sec  hold ?- Seated Knee Flexion Slide (Mirrored)  - 3 x daily - 7 x weekly - 1 sets - 3 reps - 30-60 hold ?- Standing Knee Flexion Stretch on Step  - 3 x daily - 7 x weekly - 2 sets - 10 reps ?- Standing Terminal Knee Extension at Wall with Ball (Mirrored)  - 2 x daily - 7 x weekly - 2 sets - 10 reps - 5 sec hold ?- Supine Knee Extension Strengthening  - 1 x daily - 7 x weekly - 2 sets - 10 reps - 5 sec hold ?- Sidelying Hip Flexion  - 1 x daily - 7 x weekly - 3 sets - 10 reps ?- Prone Knee Extension Hang  - 2 x daily - 7 x weekly - 1 sets - 3-5 reps - max hold ?

## 2021-05-30 ENCOUNTER — Ambulatory Visit (HOSPITAL_COMMUNITY): Payer: Medicare Other

## 2021-06-06 ENCOUNTER — Other Ambulatory Visit (HOSPITAL_COMMUNITY): Payer: Self-pay | Admitting: *Deleted

## 2021-06-09 ENCOUNTER — Telehealth: Payer: Self-pay

## 2021-06-09 ENCOUNTER — Ambulatory Visit (HOSPITAL_COMMUNITY)
Admission: RE | Admit: 2021-06-09 | Discharge: 2021-06-09 | Disposition: A | Discharge status: Home / Self Care | Payer: Medicare Other | Source: Ambulatory Visit | Attending: Neurology | Admitting: Neurology

## 2021-06-09 DIAGNOSIS — G6181 Chronic inflammatory demyelinating polyneuritis: Secondary | ICD-10-CM | POA: Insufficient documentation

## 2021-06-09 MED ORDER — IMMUNE GLOBULIN (HUMAN) 10 GM/100ML IV SOLN
1.0000 g/kg | Freq: Once | INTRAVENOUS | Status: AC
  Administered 2021-06-09: 105 g via INTRAVENOUS
  Filled 2021-06-09 (×4): qty 1050

## 2021-06-09 NOTE — Telephone Encounter (Signed)
Received a message from [facility] that patient is needing IVIG orders. Pt is having his last ordered dose of IVIG today.  Could we get new orders before next visit in 6 weeks?   ?

## 2021-06-09 NOTE — Telephone Encounter (Signed)
Ok to give order for IVIG in 6 weeks, thanks ?

## 2021-06-10 ENCOUNTER — Ambulatory Visit: Payer: Medicare Other | Admitting: Physical Therapy

## 2021-06-10 DIAGNOSIS — M25562 Pain in left knee: Secondary | ICD-10-CM | POA: Diagnosis not present

## 2021-06-10 DIAGNOSIS — M25469 Effusion, unspecified knee: Secondary | ICD-10-CM

## 2021-06-10 DIAGNOSIS — R269 Unspecified abnormalities of gait and mobility: Secondary | ICD-10-CM

## 2021-06-10 DIAGNOSIS — R531 Weakness: Secondary | ICD-10-CM

## 2021-06-10 NOTE — Therapy (Signed)
[place] ?[facility] ?[address] ?[place], [place], [address] ?Phone: [phone]   Fax:  [phone] ? ?Physical Therapy Treatment and 10th visit note ? ?Patient Details  ?Name: Kenneth Pace ?MRN: [id number] ?Date of Birth: 1950-09-10 ?Referring Provider (PT): Dr Paralee Cancel ? ? ?Encounter Date: 06/10/2021 ?Dates of service 05/08/21 - 06/10/21 ? PT End of Session - 06/10/21 1055   ? ? Visit Number 10   ? Number of Visits 24   ? Date for PT Re-Evaluation 07/31/21   ? Authorization - Visit Number 10   ? Progress Note Due on Visit 20   ? PT Start Time 1015   ? PT Stop Time 1055   ? PT Time Calculation (min) 40 min   ? Activity Tolerance Patient tolerated treatment well   ? Behavior During Therapy [facility] for tasks assessed/performed   ? ?  ?  ? ?  ? ? ?Past Medical History:  ?Diagnosis Date  ? ADENOIDECTOMY, HX OF 02/16/2007  ? Anxiety   ? pt. states he does not have anxiety.  ? BENIGN PROSTATIC HYPERTROPHY, WITH OBSTRUCTION 02/03/2010  ? Chronic inflammatory demyelinating polyneuropathy (HCC)   ? diagnosed 11/2016  ? DIABETES MELLITUS, TYPE I, UNCONTROLLED 12/29/2006  ? ERECTILE DYSFUNCTION 02/16/2007  ? Hearing loss   ? wears hearing aids  ? HYPERLIPIDEMIA 02/16/2007  ? HYPERTENSION 02/16/2007  ? Nerve pain   ? per patient "on lower back and both legs"  ? Sleep apnea   ? uses CPAP  ? TESTICULAR MASS, LEFT 02/16/2007  ? Therapeutic opioid-induced constipation (OIC)   ? ? ?Past Surgical History:  ?Procedure Laterality Date  ? ANTERIOR CERVICAL DECOMP/DISCECTOMY FUSION N/A 02/01/2017  ? Procedure: ANTERIOR CERVICAL DECOMPRESSION/DISCECTOMY FUSION CERVICAL THREE-FOUR , CERVICAL FOUR-FIVE  CERVICAL FIVE-SIX;  Surgeon: Consuella Lose, MD;  Location: [facility];  Service: Neurosurgery;  Laterality: N/A;  ? APPENDECTOMY    ? BACK SURGERY    ? COLONOSCOPY  02/05/2010  ? [facility]  ? EYE SURGERY Right   ? Dr. Katy Fitch  ? KNEE SURGERY    ? 2 Lknee arthroscopy, 3 R knee arthroscpoy   ? KNEE SURGERY Right 11/12/2016  ? LUMBAR FUSION  03/26/2020  ? LUMBAR LAMINECTOMY/DECOMPRESSION MICRODISCECTOMY Right 05/22/2019  ? Procedure: LAMINOTOMY AND MICRODISCECTOMY RIGHT LUMBAR FOUR- LUMBAR FIVE;  Surgeon: Consuella Lose, MD;  Location: [facility];  Service: Neurosurgery;  Laterality: Right;  LAMINOTOMY AND MICRODISCECTOMY RIGHT LUMBAR FOUR- LUMBAR FIVE  ? LUMBAR LAMINECTOMY/DECOMPRESSION MICRODISCECTOMY Right 02/01/2020  ? Procedure: REDO MICRODISCECTOMY RT LUMBAR FOUR-FIVE;  Surgeon: Consuella Lose, MD;  Location: [facility];  Service: Neurosurgery;  Laterality: Right;  posterior  ? TONSILLECTOMY    ? 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 Procedure: TOTAL KNEE ARTHROPLASTY;  Surgeon: Paralee Cancel, MD;  Location: [facility];  Service: Orthopedics;  Laterality: Left;  ? ? ?There were no vitals filed for this visit. ? ? Subjective Assessment - 06/10/21 1014   ? ? Subjective Pt had a good time on his vacation. Was able to get around without too much pain   ? Patient Stated Goals walking with cane independently   ? Currently in Pain? Yes   ? Pain Score 4    ? Pain Location Knee   ? Pain Orientation Left   ? Pain Descriptors / Indicators Tightness   ? ?  ?  ? ?  ? ? ? ? ? [facility] PT Assessment - 06/10/21 0001   ? ?  ? AROM  ?  Left Knee Extension 8   ? Left Knee Flexion 110   ? ?  ?  ? ?  ? ? ? ? ? ? ? ? ? ? ? ? ? ? ? ? [facility] Adult PT Treatment/Exercise - 06/10/21 0001   ? ?  ? Ambulation/Gait  ? Ambulation/Gait Assistance 6: Modified independent (Device/Increase time)   ? Ambulation Distance (Feet) 200 Feet   ? Assistive device Straight cane   ? Gait Pattern Decreased stance time - left   ?  ? Knee/Hip Exercises: Aerobic  ? Nustep L5 x 7 min pause at end range flexion and then extension to increase mobility last minute hold 10 sec in flex and ext   ?  ? Knee/Hip Exercises: Standing  ? Terminal Knee Extension Left;20 reps   ? Theraband Level (Terminal Knee Extension) Level 2 (Red)   ? Hip Abduction  Stengthening;Left;20 reps   ? Abduction Limitations sliding on pillowcase   ? Hip Extension Stengthening;Left;20 reps   ? Extension Limitations sliding on pillowcase   ? Forward Step Up Left;2 sets;10 reps;Hand Hold: 1;Step Height: 2"   ?  ? Knee/Hip Exercises: Supine  ? Quad Sets Left;10 reps   ? Short [employer] 20 reps   ? Short [employer] Limitations on green bolster   ? Heel Slides 20 reps   ? Heel Slides Limitations with strap   ? Straight Leg Raises Left;10 reps   ?  ? Manual Therapy  ? Manual therapy comments patella mobs all directions   ? ?  ?  ? ?  ? ? ? ? ? ? ? ? ? ? ? ? PT Short Term Goals - 05/28/21 1755   ? ?  ? PT SHORT TERM GOAL #2  ? Title Increase AAROMAROM Lt knee to -10 deg extension and 100 degrees flexion   ? Status Partially Met   ? ?  ?  ? ?  ? ? ? ? PT Long Term Goals - 05/08/21 1345   ? ?  ? PT LONG TERM GOAL #1  ? Title AROM Lt knee flexion 105 degrees and extension 0 to (-)3 degrees   ? Baseline -   ? Time 12   ? Period Weeks   ? Status New   ? Target Date 07/31/21   ?  ? PT LONG TERM GOAL #2  ? Title The patient will improve LE strength to 4+/5 to 5-/5 hip flexion, extension and abduction; knee flexion/extension   ? Baseline -   ? Time 12   ? Period Weeks   ? Status New   ? Target Date 07/31/21   ?  ? PT LONG TERM GOAL #3  ? Title Patient to demonstrate safe independent gait with assistive device as indicated for community distances   ? Baseline -   ? Time 12   ? Period Weeks   ? Status New   ? Target Date 07/31/21   ?  ? PT LONG TERM GOAL #4  ? Title Independent in [place] program as indicated   ? Baseline -   ? Time 12   ? Period Weeks   ? Status New   ? Target Date 07/31/21   ?  ? PT LONG TERM GOAL #5  ? Title Improve functional limitation score to 40   ? Baseline -   ? Time 12   ? Period Weeks   ? Status New   ? Target Date 07/31/21   ? ?  ?  ? ?  ? ? ? ? ? ? ? ?  Plan - 06/10/21 1055   ? ? Clinical Impression Statement Pt with improving flexion ROM, still with  limited extension ROM. Good tolerance to standing exercises today. Improving tolerance to quad sets and ROM. Pt will benefit from continued skilled PT to work towards goals   ? PT Next Visit Plan focus on knee and hip extension; gait training; progress with knee rehaba as indicated   ? PT [place]   ? Consulted and Agree with Plan of Care Patient   ? ?  ?  ? ?  ? ? ?Patient will benefit from skilled therapeutic intervention in order to improve the following deficits and impairments:    ? ?Visit Diagnosis: ?Acute pain of left knee ? ?Weakness generalized ? ?Abnormal gait ? ?Edema of knee ? ? ? ? ?Problem List ?Patient Active Problem List  ? Diagnosis Date Noted  ? 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 Lumbar herniated disc 02/01/2020  ? Lumbar radiculopathy 05/22/2019  ? Spastic gait 03/07/2019  ? Myalgia 11/01/2018  ? Chronic bilateral low back pain without sciatica 07/21/2018  ? 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 CIDP (chronic inflammatory demyelinating polyneuropathy) ([place]) 12/30/2017  ? Edema 03/01/2017  ? Hypokalemia   ? Slow transit constipation   ? Steroid-induced hyperglycemia   ? Urinary retention   ? Accidental drug overdose   ? Postoperative pain   ? Hyponatremia   ? Cervical myelopathy ([place]) 02/01/2017  ? Shortness of breath at rest   ? Hypoglycemia   ? Spondylosis, cervical, with myelopathy   ? Neuropathic pain   ? Benign essential HTN   ? Labile blood glucose   ? Muscle spasm   ? GAD (generalized anxiety disorder)   ? Acute inflammatory demyelinating polyneuropathy ([place]) 01/11/2017  ? Anxiety state   ? Neurogenic bladder   ? Depression 12/30/2016  ? Leg weakness, bilateral 12/30/2016  ? 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 GBS (Guillain Barre syndrome) ([place])   ? AIDP (acute inflammatory demyelinating polyneuropathy) ([place]) 11/27/2016  ? Weakness 11/20/2016  ? Weakness of both arms 10/30/2016  ? 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 Obstructive sleep apnea 03/24/2016  ? Numbness 03/18/2016  ? Mild nonproliferative diabetic retinopathy of both eyes without macular edema associated with type 2 diabetes mellitus ([place]) 05/24/2015  ? Nu

## 2021-06-11 ENCOUNTER — Other Ambulatory Visit: Payer: Self-pay

## 2021-06-11 NOTE — Telephone Encounter (Signed)
Attempted to place Admission Orders for IVIG and there is an error that occurred that stated administrator needed to be contacted. Will try again later.  ?

## 2021-06-13 ENCOUNTER — Ambulatory Visit: Payer: Medicare Other | Admitting: Physical Therapy

## 2021-06-13 DIAGNOSIS — R2689 Other abnormalities of gait and mobility: Secondary | ICD-10-CM

## 2021-06-13 DIAGNOSIS — M25562 Pain in left knee: Secondary | ICD-10-CM

## 2021-06-13 DIAGNOSIS — R269 Unspecified abnormalities of gait and mobility: Secondary | ICD-10-CM

## 2021-06-13 DIAGNOSIS — R531 Weakness: Secondary | ICD-10-CM

## 2021-06-13 DIAGNOSIS — M6281 Muscle weakness (generalized): Secondary | ICD-10-CM

## 2021-06-13 DIAGNOSIS — M25469 Effusion, unspecified knee: Secondary | ICD-10-CM

## 2021-06-13 NOTE — Therapy (Signed)
[place] ?[facility] ?[place] ?[place], [place], [address] ?Phone: [phone]   Fax:  [phone] ? ?Physical Therapy Treatment ? ?Patient Details  ?Name: Kenneth Pace ?MRN: [id number] ?Date of Birth: [birth year] ?Referring Provider (PT): Dr Paralee Cancel ? ? ?Encounter Date: 06/13/2021 ? ? PT End of Session - 06/13/21 1009   ? ? Visit Number 11   ? Number of Visits 24   ? Date for PT Re-Evaluation 07/31/21   ? Authorization - Visit Number 11   ? Progress Note Due on Visit 20   ? PT Start Time 1010   ? PT Stop Time 1050   ? PT Time Calculation (min) 40 min   ? Activity Tolerance Patient tolerated treatment well   ? Behavior During Therapy [facility] for tasks assessed/performed   ? ?  ?  ? ?  ? ? ?Past Medical History:  ?Diagnosis Date  ? ADENOIDECTOMY, HX OF 02/16/2007  ? Anxiety   ? pt. states he does not have anxiety.  ? BENIGN PROSTATIC HYPERTROPHY, WITH OBSTRUCTION 02/03/2010  ? Chronic inflammatory demyelinating polyneuropathy (HCC)   ? diagnosed 11/2016  ? DIABETES MELLITUS, TYPE I, UNCONTROLLED 12/29/2006  ? ERECTILE DYSFUNCTION 02/16/2007  ? Hearing loss   ? wears hearing aids  ? HYPERLIPIDEMIA 02/16/2007  ? HYPERTENSION 02/16/2007  ? Nerve pain   ? per patient "on lower back and both legs"  ? Sleep apnea   ? uses CPAP  ? TESTICULAR MASS, LEFT 02/16/2007  ? Therapeutic opioid-induced constipation (OIC)   ? ? ?Past Surgical History:  ?Procedure Laterality Date  ? ANTERIOR CERVICAL DECOMP/DISCECTOMY FUSION N/A 02/01/2017  ? Procedure: ANTERIOR CERVICAL DECOMPRESSION/DISCECTOMY FUSION CERVICAL THREE-FOUR , CERVICAL FOUR-FIVE  CERVICAL FIVE-SIX;  Surgeon: Consuella Lose, MD;  Location: [place];  Service: Neurosurgery;  Laterality: N/A;  ? APPENDECTOMY    ? BACK SURGERY    ? COLONOSCOPY  02/05/2010  ? [place]  ? EYE SURGERY Right   ? Dr. Katy Fitch  ? KNEE SURGERY    ? 2 Lknee arthroscopy, 3 R knee arthroscpoy  ? KNEE SURGERY Right 11/12/2016  ? LUMBAR FUSION   03/26/2020  ? LUMBAR LAMINECTOMY/DECOMPRESSION MICRODISCECTOMY Right 05/22/2019  ? Procedure: LAMINOTOMY AND MICRODISCECTOMY RIGHT LUMBAR FOUR- LUMBAR FIVE;  Surgeon: Consuella Lose, MD;  Location: [facility];  Service: Neurosurgery;  Laterality: Right;  LAMINOTOMY AND MICRODISCECTOMY RIGHT LUMBAR FOUR- LUMBAR FIVE  ? LUMBAR LAMINECTOMY/DECOMPRESSION MICRODISCECTOMY Right 02/01/2020  ? Procedure: REDO MICRODISCECTOMY RT LUMBAR FOUR-FIVE;  Surgeon: Consuella Lose, MD;  Location: [facility];  Service: Neurosurgery;  Laterality: Right;  posterior  ? TONSILLECTOMY    ? 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 Procedure: TOTAL KNEE ARTHROPLASTY;  Surgeon: Paralee Cancel, MD;  [facility];  Service: Orthopedics;  Laterality: Left;  ? ? ?There were no vitals filed for this visit. ? ? Subjective Assessment - 06/13/21 1015   ? ? Subjective Pt reports nothing new or different.   ? Pertinent History AODM; IVIG for guillain barre; arthritis; multiple lumbar and cervical surgeries/fusions; sleep apnea   ? Patient Stated Goals walking with cane independently   ? Currently in Pain? Yes   ? Pain Score 4    ? Pain Location Knee   ? Pain Orientation Left   ? Pain Descriptors / Indicators Tightness   ? Pain Type Surgical pain   ? ?  ?  ? ?  ? ? ? ? ? [facility] PT Assessment - 06/13/21 0001   ? ?  ?  Assessment  ? Medical Diagnosis Lt TKA   ? Referring Provider (PT) Dr Paralee Cancel   ? ?  ?  ? ?  ? ? ? ? ? ? ? ? ? ? ? ? ? ? ? ? [facility] Adult PT Treatment/Exercise - 06/13/21 0001   ? ?  ? Ambulation/Gait  ? Ambulation Distance (Feet) 200 Feet   ? Assistive device Straight cane   ? Gait Pattern Decreased stance time - left   ? Gait Comments cues for heel strike and knee extension. Backwards walking x 64' with minor LOBs requiring CGA with focus on knee extension   ?  ? Knee/Hip Exercises: Stretches  ? Knee: Self-Stretch to increase Flexion Left;5 reps;10 seconds   ? Knee: Self-Stretch Limitations prone using strap   ? Gastroc Stretch Left;30  seconds;2 reps   ? Soleus Stretch 2 reps;30 seconds;Left   ? Other Knee/Hip Stretches knee extension stretch feet off EOB in prone x2 min   ?  ? Knee/Hip Exercises: Aerobic  ? Nustep L5 x 5 min   ?  ? Knee/Hip Exercises: Standing  ? Heel Raises Both;2 sets;10 reps   ? Terminal Knee Extension Left;20 reps   ? Theraband Level (Terminal Knee Extension) Level 2 (Red)   ?  ? Knee/Hip Exercises: Seated  ? Sit to Sand 10 reps   raised plinth, hands on knees  ?  ? Knee/Hip Exercises: Prone  ? Hamstring Curl 2 sets;10 reps   ? Hamstring Curl Limitations 1.5 #   ? Hip Extension Strengthening;Left;2 sets;10 reps   ? Hip Extension Limitations knee flexed   ? Other [place]one Exercises hip extension + abd 2x10   ? ?  ?  ? ?  ? ? ? ? ? ? ? ? ? ? ? ? PT Short Term Goals - 05/28/21 1755   ? ?  ? PT SHORT TERM GOAL #2  ? Title Increase AAROMAROM Lt knee to -10 deg extension and 100 degrees flexion   ? Status Partially Met   ? ?  ?  ? ?  ? ? ? ? PT Long Term Goals - 05/08/21 1345   ? ?  ? PT LONG TERM GOAL #1  ? Title AROM Lt knee flexion 105 degrees and extension 0 to (-)3 degrees   ? Baseline -   ? Time 12   ? Period Weeks   ? Status New   ? Target Date 07/31/21   ?  ? PT LONG TERM GOAL #2  ? Title The patient will improve LE strength to 4+/5 to 5-/5 hip flexion, extension and abduction; knee flexion/extension   ? Baseline -   ? Time 12   ? Period Weeks   ? Status New   ? Target Date 07/31/21   ?  ? PT LONG TERM GOAL #3  ? Title Patient to demonstrate safe independent gait with assistive device as indicated for community distances   ? Baseline -   ? Time 12   ? Period Weeks   ? Status New   ? Target Date 07/31/21   ?  ? PT LONG TERM GOAL #4  ? Title Independent in [place] program as indicated   ? Baseline -   ? Time 12   ? Period Weeks   ? Status New   ? Target Date 07/31/21   ?  ? PT LONG TERM GOAL #5  ? Title Improve functional limitation score to 40   ? Baseline -   ?  Time 12   ? Period Weeks   ? Status New   ? Target  Date 07/31/21   ? ?  ?  ? ?  ? ? ? ? ? ? ? ? Plan - 06/13/21 1024   ? ? Clinical Impression Statement Continued to work on improving knee extension and knee strengthening.   ? PT Next Visit Plan focus on knee and hip extension; gait training; progress with knee rehaba as indicated   ? PT [place]   ? Consulted and Agree with Plan of Care Patient   ? ?  ?  ? ?  ? ? ?Patient will benefit from skilled therapeutic intervention in order to improve the following deficits and impairments:    ? ?Visit Diagnosis: ?Acute pain of left knee ? ?Weakness generalized ? ?Abnormal gait ? ?Edema of knee ? ?Muscle weakness (generalized) ? ?Other abnormalities of gait and mobility ? ? ? ? ?Problem List ?Patient Active Problem List  ? Diagnosis Date Noted  ? 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 Lumbar herniated disc 02/01/2020  ? Lumbar radiculopathy 05/22/2019  ? Spastic gait 03/07/2019  ? Myalgia 11/01/2018  ? Chronic bilateral low back pain without sciatica 07/21/2018  ? 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 CIDP (chronic inflammatory demyelinating polyneuropathy) ([place]) 12/30/2017  ? Edema 03/01/2017  ? Hypokalemia   ? Slow transit constipation   ? Steroid-induced hyperglycemia   ? Urinary retention   ? Accidental drug overdose   ? Postoperative pain   ? Hyponatremia   ? Cervical myelopathy ([place]) 02/01/2017  ? Shortness of breath at rest   ? Hypoglycemia   ? Spondylosis, cervical, with myelopathy   ? Neuropathic pain   ? Benign essential HTN   ? Labile blood glucose   ? Muscle spasm   ? GAD (generalized anxiety disorder)   ? Acute inflammatory demyelinating polyneuropathy ([place]) 01/11/2017  ? Anxiety state   ? Neurogenic bladder   ? Depression 12/30/2016  ? Leg weakness, bilateral 12/30/2016  ? 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 GBS (Guillain Barre syndrome) ([place])   ? AIDP (acute inflammatory demyelinating polyneuropathy) ([place]) 11/27/2016  ? Weakness 11/20/2016   ? Weakness of both arms 10/30/2016  ? 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 Obstructive sleep apnea 03/24/2016  ? Numbness 03/18/2016  ? 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

## 2021-06-16 NOTE — Telephone Encounter (Signed)
Will Place IVIG orders tomorrow with Alyse Low. ?

## 2021-06-17 ENCOUNTER — Ambulatory Visit: Payer: Medicare Other | Attending: Orthopedic Surgery | Admitting: Physical Therapy

## 2021-06-17 ENCOUNTER — Other Ambulatory Visit: Payer: Self-pay

## 2021-06-17 DIAGNOSIS — M25562 Pain in left knee: Secondary | ICD-10-CM

## 2021-06-17 DIAGNOSIS — M25469 Effusion, unspecified knee: Secondary | ICD-10-CM | POA: Insufficient documentation

## 2021-06-17 DIAGNOSIS — R2689 Other abnormalities of gait and mobility: Secondary | ICD-10-CM

## 2021-06-17 DIAGNOSIS — R269 Unspecified abnormalities of gait and mobility: Secondary | ICD-10-CM

## 2021-06-17 DIAGNOSIS — R531 Weakness: Secondary | ICD-10-CM

## 2021-06-17 DIAGNOSIS — M6281 Muscle weakness (generalized): Secondary | ICD-10-CM | POA: Diagnosis present

## 2021-06-17 NOTE — Progress Notes (Signed)
IVIG placed ?

## 2021-06-17 NOTE — Telephone Encounter (Signed)
Orders have been placed for IVIG.  ?

## 2021-06-17 NOTE — Therapy (Signed)
?[facility] ?[place] ?[place], [place], [address] ?Phone: [phone]   Fax:  [phone] ? ?Physical Therapy Treatment ? ?Patient Details  ?Name: Kenneth Pace ?MRN: [id number] ?Date of Birth: [birth year] ?Referring Provider (PT): Dr Paralee Cancel ? ? ?Encounter Date: 06/17/2021 ? ? PT End of Session - 06/17/21 1007   ? ? Visit Number 12   ? Number of Visits 24   ? Date for PT Re-Evaluation 07/31/21   ? Authorization - Visit Number 12   ? Progress Note Due on Visit 20   ? PT Start Time 1007   ? PT Stop Time 1050   ? PT Time Calculation (min) 43 min   ? Activity Tolerance Patient tolerated treatment well   ? Behavior During Therapy [facility] for tasks assessed/performed   ? ?  ?  ? ?  ? ? ?Past Medical History:  ?Diagnosis Date  ? ADENOIDECTOMY, HX OF 02/16/2007  ? Anxiety   ? pt. states he does not have anxiety.  ? BENIGN PROSTATIC HYPERTROPHY, WITH OBSTRUCTION 02/03/2010  ? Chronic inflammatory demyelinating polyneuropathy (HCC)   ? diagnosed 11/2016  ? DIABETES MELLITUS, TYPE I, UNCONTROLLED 12/29/2006  ? ERECTILE DYSFUNCTION 02/16/2007  ? Hearing loss   ? wears hearing aids  ? HYPERLIPIDEMIA 02/16/2007  ? HYPERTENSION 02/16/2007  ? Nerve pain   ? per patient "on lower back and both legs"  ? Sleep apnea   ? uses CPAP  ? TESTICULAR MASS, LEFT 02/16/2007  ? Therapeutic opioid-induced constipation (OIC)   ? ? ?Past Surgical History:  ?Procedure Laterality Date  ? ANTERIOR CERVICAL DECOMP/DISCECTOMY FUSION N/A 02/01/2017  ? Procedure: ANTERIOR CERVICAL DECOMPRESSION/DISCECTOMY FUSION CERVICAL THREE-FOUR , CERVICAL FOUR-FIVE  CERVICAL FIVE-SIX;  Surgeon: Consuella Lose, MD;  Location: [facility];  Service: Neurosurgery;  Laterality: N/A;  ? APPENDECTOMY    ? BACK SURGERY    ? COLONOSCOPY  02/05/2010  ? [facility]  ? EYE SURGERY Right   ? Dr. Katy Fitch  ? KNEE SURGERY    ? 2 Lknee arthroscopy, 3 R knee arthroscpoy  ? KNEE SURGERY Right 11/12/2016  ? LUMBAR FUSION   03/26/2020  ? LUMBAR LAMINECTOMY/DECOMPRESSION MICRODISCECTOMY Right 05/22/2019  ? Procedure: LAMINOTOMY AND MICRODISCECTOMY RIGHT LUMBAR FOUR- LUMBAR FIVE;  Surgeon: Consuella Lose, MD;  Location: [facility];  Service: Neurosurgery;  Laterality: Right;  LAMINOTOMY AND MICRODISCECTOMY RIGHT LUMBAR FOUR- LUMBAR FIVE  ? LUMBAR LAMINECTOMY/DECOMPRESSION MICRODISCECTOMY Right 02/01/2020  ? Procedure: REDO MICRODISCECTOMY RT LUMBAR FOUR-FIVE;  Surgeon: Consuella Lose, MD;  Location: [facility];  Service: Neurosurgery;  Laterality: Right;  posterior  ? TONSILLECTOMY    ? 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 Procedure: TOTAL KNEE ARTHROPLASTY;  Surgeon: Paralee Cancel, MD;  Location: [facility];  Service: Orthopedics;  Laterality: Left;  ? ? ?There were no vitals filed for this visit. ? ? Subjective Assessment - 06/17/21 1019   ? ? Subjective Pt reports a good weekend. Has been working on exercises.   ? Pertinent History AODM; IVIG for guillain barre; arthritis; multiple lumbar and cervical surgeries/fusions; sleep apnea   ? Patient Stated Goals walking with cane independently   ? Currently in Pain? Yes   ? Pain Score 5    ? Pain Location Knee   ? ?  ?  ? ?  ? ? ? ? ? [facility] PT Assessment - 06/17/21 0001   ? ?  ? Assessment  ? Medical Diagnosis Lt TKA   ? Referring Provider (PT) Dr Rodman Key  Alvan Dame   ? ?  ?  ? ?  ? ? ? ? ? ? ? ? ? ? ? ? ? ? ? ? [place] Adult PT Treatment/Exercise - 06/17/21 0001   ? ?  ? Ambulation/Gait  ? Ambulation Distance (Feet) 80 Feet   ? Assistive device Straight cane   ? Gait Pattern Decreased stance time - left   ?  ? Knee/Hip Exercises: Stretches  ? Passive Hamstring Stretch Left;30 seconds   ? ITB Stretch Left;30 seconds   ? Gastroc Stretch Left;30 seconds   ? Soleus Stretch Left;30 seconds   ?  ? Knee/Hip Exercises: Aerobic  ? Recumbent Bike x 3 min forward, 3 min backward   ?  ? Knee/Hip Exercises: Standing  ? Other Standing Knee Exercises Mini squat with hand held assist 2x10   ? Other Standing Knee  Exercises lateral band walk x3 reps by counter red tband; backwards monster walk x3 reps by counter red tband   ?  ? Knee/Hip Exercises: Supine  ? Quad Sets Left;10 reps   ? Quad Sets Limitations with hamstring stretch and then with LE resting   ? Short Arc [employer] 2 sets;10 reps   ? Short Arc [employer] Limitations on green bolster and 2# weight   ? Bridges Both;2 sets;10 reps   ? Other Supine Knee/Hip Exercises Marching 2x10   ?  ? Knee/Hip Exercises: Prone  ? Hamstring Curl 2 sets;10 reps   ? Hamstring Curl Limitations 2#   ? Prone Knee Hang 2 minutes   ? Prone Knee Hang Weights (lbs) 2   ?  ? Manual Therapy  ? Manual therapy comments grade III mobs into knee extension; contract relax PNF 3x5 sec hold   ? ?  ?  ? ?  ? ? ? ? ? ? ? ? ? ? ? ? PT Short Term Goals - 06/17/21 1054   ? ?  ? PT SHORT TERM GOAL #1  ? Title Independent in initial HEP   ? Status Achieved   ?  ? PT SHORT TERM GOAL #2  ? Title Increase AAROMAROM Lt knee to -10 deg extension and 100 degrees flexion   ? Status Achieved   ?  ? PT SHORT TERM GOAL #3  ? Title Independent in ambulation with walking for functional, community distances   ? Baseline using SPC   ? Status On-going   ? ?  ?  ? ?  ? ? ? ? PT Long Term Goals - 05/08/21 1345   ? ?  ? PT LONG TERM GOAL #1  ? Title AROM Lt knee flexion 105 degrees and extension 0 to (-)3 degrees   ? Baseline -   ? Time 12   ? Period Weeks   ? Status New   ? Target Date 07/31/21   ?  ? PT LONG TERM GOAL #2  ? Title The patient will improve LE strength to 4+/5 to 5-/5 hip flexion, extension and abduction; knee flexion/extension   ? Baseline -   ? Time 12   ? Period Weeks   ? Status New   ? Target Date 07/31/21   ?  ? PT LONG TERM GOAL #3  ? Title Patient to demonstrate safe independent gait with assistive device as indicated for community distances   ? Baseline -   ? Time 12   ? Period Weeks   ? Status New   ? Target Date 07/31/21   ?  ?  PT LONG TERM GOAL #4  ? Title Independent in [place]  program as indicated   ? Baseline -   ? Time 12   ? Period Weeks   ? Status New   ? Target Date 07/31/21   ?  ? PT LONG TERM GOAL #5  ? Title Improve functional limitation score to 40   ? Baseline -   ? Time 12   ? Period Weeks   ? Status New   ? Target Date 07/31/21   ? ?  ?  ? ?  ? ? ? ? ? ? ? ? Plan - 06/17/21 1020   ? ? Clinical Impression Statement Pt able to make full revolutions on recumbent bike this session. Continued to work on improving knee extension. Pt now able to tolerate manual work to improve knee extension. Continued strengthening for hips/knees. He has met 2 out of 3 STGs. He is still not yet completely without using a/d safely.   ? Personal Factors and Comorbidities Comorbidity 1;Comorbidity 2;Comorbidity 3+   ? 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 Examination-Activity Limitations Bathing;Locomotion Level;Bed Mobility;Transfers;Bend;Carry;Dressing;Hygiene/Grooming;Lift;Sit;Sleep;Squat;Stairs;Stand;Toileting   ? Examination-Participation Restrictions Community Activity;Yard Work;Shop;Other   ? PT Treatment/Interventions ADLs/Self Care Home Management;Aquatic Therapy;Cryotherapy;Electrical Stimulation;Iontophoresis 10m/ml Dexamethasone;Moist Heat;Ultrasound;Gait training;Stair training;Functional mobility training;Therapeutic activities;Therapeutic exercise;Balance training;Neuromuscular re-education;Patient/family education;Manual techniques;Scar mobilization;Passive range of motion;Dry needling;Taping;Vasopneumatic Device   ? PT Next Visit Plan focus on knee and hip extension; gait training; progress with knee rehaba as indicated   ? PT [place]  ? Consulted and Agree with Plan of Care Patient   ? ?  ?  ? ?  ? ? ?Patient will benefit from skilled therapeutic intervention in order to improve the following deficits and impairments:  Abnormal gait, Decreased coordination, Decreased range of motion, Difficulty walking,  Decreased activity tolerance, Pain, Decreased balance, Impaired flexibility, Decreased mobility, Decreased strength, Increased edema, Postural dysfunction ? ?Visit Diagnosis: ?Acute pain of left knee ? ?Weakness generaliz

## 2021-06-19 ENCOUNTER — Ambulatory Visit: Payer: Medicare Other | Admitting: Rehabilitative and Restorative Service Providers"

## 2021-06-19 ENCOUNTER — Ambulatory Visit (INDEPENDENT_AMBULATORY_CARE_PROVIDER_SITE_OTHER): Payer: Medicare Other | Admitting: Endocrinology

## 2021-06-19 ENCOUNTER — Encounter: Payer: Self-pay | Admitting: Endocrinology

## 2021-06-19 ENCOUNTER — Encounter: Payer: Self-pay | Admitting: Rehabilitative and Restorative Service Providers"

## 2021-06-19 VITALS — BP 132/76 | HR 92 | Ht 72.0 in | Wt 222.4 lb

## 2021-06-19 DIAGNOSIS — E1042 Type 1 diabetes mellitus with diabetic polyneuropathy: Secondary | ICD-10-CM

## 2021-06-19 DIAGNOSIS — M25562 Pain in left knee: Secondary | ICD-10-CM | POA: Diagnosis not present

## 2021-06-19 DIAGNOSIS — M6281 Muscle weakness (generalized): Secondary | ICD-10-CM

## 2021-06-19 DIAGNOSIS — R2689 Other abnormalities of gait and mobility: Secondary | ICD-10-CM

## 2021-06-19 DIAGNOSIS — R269 Unspecified abnormalities of gait and mobility: Secondary | ICD-10-CM

## 2021-06-19 DIAGNOSIS — R531 Weakness: Secondary | ICD-10-CM

## 2021-06-19 LAB — POCT GLYCOSYLATED HEMOGLOBIN (HGB A1C): Hemoglobin A1C: 7.2 % — AB (ref 4.0–5.6)

## 2021-06-19 NOTE — Therapy (Signed)
[place] ?[facility] ?[place] ?[place], [place], [address] ?Phone: [phone]   Fax:  [phone] ? ?Physical Therapy Treatment ? ?Patient Details  ?Name: Kenneth Pace ?MRN: [id number] ?Date of Birth: 06-11-1950 ?Referring Provider (PT): Dr Paralee Cancel ? ? ?Encounter Date: 06/19/2021 ? ? PT End of Session - 06/19/21 1211   ? ? Visit Number 13   ? Date for PT Re-Evaluation 07/31/21   ? Authorization - Visit Number 13   ? Progress Note Due on Visit 20   ? PT Start Time 1145   ? PT Stop Time 1234   ? PT Time Calculation (min) 49 min   ? Activity Tolerance Patient tolerated treatment well   ? ?  ?  ? ?  ? ? ?Past Medical History:  ?Diagnosis Date  ? ADENOIDECTOMY, HX OF 02/16/2007  ? Anxiety   ? pt. states he does not have anxiety.  ? BENIGN PROSTATIC HYPERTROPHY, WITH OBSTRUCTION 02/03/2010  ? Chronic inflammatory demyelinating polyneuropathy (HCC)   ? diagnosed 11/2016  ? DIABETES MELLITUS, TYPE I, UNCONTROLLED 12/29/2006  ? ERECTILE DYSFUNCTION 02/16/2007  ? Hearing loss   ? wears hearing aids  ? HYPERLIPIDEMIA 02/16/2007  ? HYPERTENSION 02/16/2007  ? Nerve pain   ? per patient "on lower back and both legs"  ? Sleep apnea   ? uses CPAP  ? TESTICULAR MASS, LEFT 02/16/2007  ? Therapeutic opioid-induced constipation (OIC)   ? ? ?Past Surgical History:  ?Procedure Laterality Date  ? ANTERIOR CERVICAL DECOMP/DISCECTOMY FUSION N/A 02/01/2017  ? Procedure: ANTERIOR CERVICAL DECOMPRESSION/DISCECTOMY FUSION CERVICAL THREE-FOUR , CERVICAL FOUR-FIVE  CERVICAL FIVE-SIX;  Surgeon: Consuella Lose, MD;  Location: [facility];  Service: Neurosurgery;  Laterality: N/A;  ? APPENDECTOMY    ? BACK SURGERY    ? COLONOSCOPY  02/05/2010  ? [place]  ? EYE SURGERY Right   ? Dr. Katy Fitch  ? KNEE SURGERY    ? 2 Lknee arthroscopy, 3 R knee arthroscpoy  ? KNEE SURGERY Right 11/12/2016  ? LUMBAR FUSION  03/26/2020  ? LUMBAR LAMINECTOMY/DECOMPRESSION MICRODISCECTOMY Right 05/22/2019  ?  Procedure: LAMINOTOMY AND MICRODISCECTOMY RIGHT LUMBAR FOUR- LUMBAR FIVE;  Surgeon: Consuella Lose, MD;  Location: [facility];  Service: Neurosurgery;  Laterality: Right;  LAMINOTOMY AND MICRODISCECTOMY RIGHT LUMBAR FOUR- LUMBAR FIVE  ? LUMBAR LAMINECTOMY/DECOMPRESSION MICRODISCECTOMY Right 02/01/2020  ? Procedure: REDO MICRODISCECTOMY RT LUMBAR FOUR-FIVE;  Surgeon: Consuella Lose, MD;  Location: [facility];  Service: Neurosurgery;  Laterality: Right;  posterior  ? TONSILLECTOMY    ? 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 Procedure: TOTAL KNEE ARTHROPLASTY;  Surgeon: Paralee Cancel, MD;  Location: [facility];  Service: Orthopedics;  Laterality: Left;  ? ? ?There were no vitals filed for this visit. ? ? Subjective Assessment - 06/19/21 1212   ? ? Subjective Patient is working on exercises at home. He had a MD appointment this am, busy day.   ? Currently in Pain? Yes   ? Pain Score 5    ? Pain Location Knee   ? Pain Orientation Left   ? Pain Descriptors / Indicators Tightness   ? Pain Type Surgical pain;Chronic pain   ? ?  ?  ? ?  ? ? ? ? ? ? ? ? ? ? ? ? ? ? ? ? ? ? ? ? [facility] Adult PT Treatment/Exercise - 06/19/21 0001   ? ?  ? Knee/Hip Exercises: Stretches  ? Passive Hamstring Stretch Left;30 seconds   ? Passive Hamstring Stretch  Limitations prone with knee off EOB with 2# wt   ?  ? Knee/Hip Exercises: Aerobic  ? Recumbent Bike 5 min level 2   ?  ? Knee/Hip Exercises: Standing  ? Terminal Knee Extension Strengthening;Left;10 reps;Theraband   ? Theraband Level (Terminal Knee Extension) Level 4 (Blue)   ? Terminal Knee Extension Limitations repeated pressing into coregeous ball 10 sec x 10 reps back to wall focus on wt bearing Lt LE   ? Forward Step Up Left;10 reps;Hand Hold: 0;Hand Hold: 2;Step Height: 6"   ? Forward Step Up Limitations focus on straightening with step up   ? Wall Squat 10 reps;5 seconds   ?  ? Knee/Hip Exercises: Supine  ? Quad Sets Left;10 reps   ? Short Arc [employer] 2 sets;10 reps   ? Short Arc [employer] Limitations green bolster   ?  ? Knee/Hip Exercises: Prone  ? Hamstring Curl 2 sets;10 reps   ? Hamstring Curl Limitations 2#   ? Prone Knee Hang 2 minutes   ? Prone Knee Hang Weights (lbs) 2   ? Prone Knee Hang Limitations breaks for rest periodically   ?  ? Manual Therapy  ? Passive ROM Passive stretch PT assist into knee extension 10 sec hold x 3 reps heel supported on bolster   ? ?  ?  ? ?  ? ? ? ? ? ? ? ? ? ? ? ? PT Short Term Goals - 06/17/21 1054   ? ?  ? PT SHORT TERM GOAL #1  ? Title Independent in initial HEP   ? Status Achieved   ?  ? PT SHORT TERM GOAL #2  ? Title Increase AAROMAROM Lt knee to -10 deg extension and 100 degrees flexion   ? Status Achieved   ?  ? PT SHORT TERM GOAL #3  ? Title Independent in ambulation with walking for functional, community distances   ? Baseline using SPC   ? Status On-going   ? ?  ?  ? ?  ? ? ? ? PT Long Term Goals - 05/08/21 1345   ? ?  ? PT LONG TERM GOAL #1  ? Title AROM Lt knee flexion 105 degrees and extension 0 to (-)3 degrees   ? Baseline -   ? Time 12   ? Period Weeks   ? Status New   ? Target Date 07/31/21   ?  ? PT LONG TERM GOAL #2  ? Title The patient will improve LE strength to 4+/5 to 5-/5 hip flexion, extension and abduction; knee flexion/extension   ? Baseline -   ? Time 12   ? Period Weeks   ? Status New   ? Target Date 07/31/21   ?  ? PT LONG TERM GOAL #3  ? Title Patient to demonstrate safe independent gait with assistive device as indicated for community distances   ? Baseline -   ? Time 12   ? Period Weeks   ? Status New   ? Target Date 07/31/21   ?  ? PT LONG TERM GOAL #4  ? Title Independent in [place] program as indicated   ? Baseline -   ? Time 12   ? Period Weeks   ? Status New   ? Target Date 07/31/21   ?  ? PT LONG TERM GOAL #5  ? Title Improve functional limitation score to 40   ? Baseline -   ? Time 12   ?  Period Weeks   ? Status New   ? Target Date 07/31/21   ? ?  ?  ? ?  ? ? ? ? ? ? ? ? Plan - 06/19/21 1213   ? ?  Clinical Impression Statement Patient continued work on knee rehab, work focus on knee extension.   ? Rehab Potential Good   ? PT Frequency 2x / week   ? PT Duration 12 weeks   ? PT Treatment/Interventions ADLs/Self Care Home Management;Aquatic Therapy;Cryotherapy;Electrical Stimulation;Iontophoresis '4mg'$ /ml Dexamethasone;Moist Heat;Ultrasound;Gait training;Stair training;Functional mobility training;Therapeutic activities;Therapeutic exercise;Balance training;Neuromuscular re-education;Patient/family education;Manual techniques;Scar mobilization;Passive range of motion;Dry needling;Taping;Vasopneumatic Device   ? PT Next Visit Plan focus on knee and hip extension; gait training; progress with knee rehaba as indicated   ? PT [place]   ? Consulted and Agree with Plan of Care Patient   ? ?  ?  ? ?  ? ? ?Patient will benefit from skilled therapeutic intervention in order to improve the following deficits and impairments:    ? ?Visit Diagnosis: ?Acute pain of left knee ? ?Weakness generalized ? ?Abnormal gait ? ?Muscle weakness (generalized) ? ?Other abnormalities of gait and mobility ? ? ? ? ?Problem List ?Patient Active Problem List  ? Diagnosis Date Noted  ? 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 Lumbar herniated disc 02/01/2020  ? Lumbar radiculopathy 05/22/2019  ? Spastic gait 03/07/2019  ? Myalgia 11/01/2018  ? Chronic bilateral low back pain without sciatica 07/21/2018  ? 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 CIDP (chronic inflammatory demyelinating polyneuropathy) ([place]) 12/30/2017  ? Edema 03/01/2017  ? Hypokalemia   ? Slow transit constipation   ? Steroid-induced hyperglycemia   ? Urinary retention   ? Accidental drug overdose   ? Postoperative pain   ? Hyponatremia   ? Cervical myelopathy ([place]) 02/01/2017  ? Shortness of breath at rest   ? Hypoglycemia   ? Spondylosis, cervical, with myelopathy   ? Neuropathic pain   ? Benign essential HTN   ? Labile blood glucose   ? Muscle spasm    ? GAD (generalized anxiety disorder)   ? Acute inflammatory demyelinating polyneuropathy ([place]) 01/11/2017  ? Anxiety state   ? Neurogenic bladder   ? Depression 12/30/2016  ? Leg weakness, bilateral 12/30/2016  ? C

## 2021-06-19 NOTE — Progress Notes (Signed)
? ?Subjective:  ? ? Patient ID: Kenneth Pace, male    DOB: 08/24/1950, 71 y.o.   MRN: [id number] ? ?HPI ?Pt returns for f/u of diabetes mellitus:  ?DM type: 1 ?Dx'ed: 1984 ?Complications: DR and PN.   ?Therapy: insulin since dx.  ?DKA: only at dx.   ?Severe hypoglycemia: most recently in 2014.  ?Pancreatitis: never ?SDOH: he is retired ?Other: he has a medtronic 670 pump and continuous glucose monitor; he is retired. continuous glucose monitor ID is [email or web address] and password is [email or web address]; Wife says CHO counting is not good, but CDE visit did not help.  ?Interval history: he takes these pump settings:  ?basal rate of 4 units/hr 6 AM-10 PM, and 2.6 units/hr 10 PM-6 AM (when not in auto mode). ?Mealtime bolus of 1 unit/4 grams carbohydrate.   ?correction bolus (which some people call "sensitivity," or "insulin sensitivity ratio," or just "isr") of 1 unit for each 50 by which your glucose exceeds 100.   ?suspend the pump for 1-2 hrs, with activity.  If not, eat a light snack with it.   ?TDD is 92 units (71% basal).  He takes 2.1 meal boluses per day. He is in auto mode just 60% of the time.   ?I reviewed continuous glucose monitor data.  Glucose varies from 80-340.  It decreases slightly overnight.  It is in general highest at 9AM, and 9PM.  He still has hypoglycemia approx once per week.  This happens with exertion.   ?Past Medical History:  ?Diagnosis Date  ? ADENOIDECTOMY, HX OF 02/16/2007  ? Anxiety   ? pt. states he does not have anxiety.  ? BENIGN PROSTATIC HYPERTROPHY, WITH OBSTRUCTION 02/03/2010  ? Chronic inflammatory demyelinating polyneuropathy (HCC)   ? diagnosed 11/2016  ? DIABETES MELLITUS, TYPE I, UNCONTROLLED 12/29/2006  ? ERECTILE DYSFUNCTION 02/16/2007  ? Hearing loss   ? wears hearing aids  ? HYPERLIPIDEMIA 02/16/2007  ? HYPERTENSION 02/16/2007  ? Nerve pain   ? per patient "on lower back and both legs"  ? Sleep apnea   ? uses CPAP  ? TESTICULAR MASS, LEFT 02/16/2007  ? Therapeutic  opioid-induced constipation (OIC)   ? ? ?Past Surgical History:  ?Procedure Laterality Date  ? ANTERIOR CERVICAL DECOMP/DISCECTOMY FUSION N/A 02/01/2017  ? Procedure: ANTERIOR CERVICAL DECOMPRESSION/DISCECTOMY FUSION CERVICAL THREE-FOUR , CERVICAL FOUR-FIVE  CERVICAL FIVE-SIX;  Surgeon: Consuella Lose, MD;  Location: [facility];  Service: Neurosurgery;  Laterality: N/A;  ? APPENDECTOMY    ? BACK SURGERY    ? COLONOSCOPY  02/05/2010  ? [facility]  ? EYE SURGERY Right   ? Dr. Katy Fitch  ? KNEE SURGERY    ? 2 Lknee arthroscopy, 3 R knee arthroscpoy  ? KNEE SURGERY Right 11/12/2016  ? LUMBAR FUSION  03/26/2020  ? LUMBAR LAMINECTOMY/DECOMPRESSION MICRODISCECTOMY Right 05/22/2019  ? Procedure: LAMINOTOMY AND MICRODISCECTOMY RIGHT LUMBAR FOUR- LUMBAR FIVE;  Surgeon: Consuella Lose, MD;  Location: [facility];  Service: Neurosurgery;  Laterality: Right;  LAMINOTOMY AND MICRODISCECTOMY RIGHT LUMBAR FOUR- LUMBAR FIVE  ? LUMBAR LAMINECTOMY/DECOMPRESSION MICRODISCECTOMY Right 02/01/2020  ? Procedure: REDO MICRODISCECTOMY RT LUMBAR FOUR-FIVE;  Surgeon: Consuella Lose, MD;  Location: [facility];  Service: Neurosurgery;  Laterality: Right;  posterior  ? TONSILLECTOMY    ? 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 Procedure: TOTAL KNEE ARTHROPLASTY;  Surgeon: Paralee Cancel, MD;  Location: [facility];  Service: Orthopedics;  Laterality: Left;  ? ? ?Social History  ? ?Socioeconomic History  ? Marital status: Married  ?  Spouse name:  Not on file  ? Number of children: Not on file  ? Years of education: Not on file  ? Highest education level: Not on file  ?Occupational History  ? Occupation: ENGINEER  ?  Employer: [employer]  ?  Comment: [employer]  ?Tobacco Use  ? Smoking status: Never  ? Smokeless tobacco: Never  ?Vaping Use  ? Vaping Use: Never used  ?Substance and Sexual Activity  ? Alcohol use: No  ? Drug use: No  ? Sexual activity: Not Currently  ?Other Topics Concern  ? Not on file  ?Social History Narrative  ? He works for  [employer]  ? He lives at home with wife.    ? Highest level of education:  BS, business admin  ? Right handed  ? Two story home  ? ?Social Determinants of Health  ? ?Financial Resource Strain: Not on file  ?Food Insecurity: Not on file  ?Transportation Needs: Not on file  ?Physical Activity: Not on file  ?Stress: Not on file  ?Social Connections: Not on file  ?Intimate Partner Violence: Not on file  ? ? ?Current Outpatient Medications on File Prior to Visit  ?Medication Sig Dispense Refill  ? acetaminophen (TYLENOL) 500 MG tablet Take 500 mg by mouth every 6 (six) hours as needed for mild pain.    ? amitriptyline (ELAVIL) 75 MG tablet TAKE 1 TABLET BY MOUTH AT  BEDTIME 90 tablet 3  ? B Complex Vitamins (B-COMPLEX/B-12 PO) Take 1 tablet by mouth at bedtime.    ? Blood Glucose Monitoring Suppl (ACCU-CHEK GUIDE ME) w/Device KIT Check blood sugars as instructed 1 kit 0  ? celecoxib (CELEBREX) 200 MG capsule Take 1 capsule (200 mg total) by mouth 2 (two) times daily. 60 capsule 0  ? clotrimazole-betamethasone (LOTRISONE) cream Apply 1 application topically 2 (two) times daily as needed (apply to feet skin irritation). 45 g 2  ? Cranberry 400 MG TABS Take 400 mg by mouth at bedtime.    ? docusate sodium (COLACE) 100 MG capsule Take 1 capsule (100 mg total) by mouth 2 (two) times daily. 10 capsule 0  ? DULoxetine (CYMBALTA) 60 MG capsule TAKE 1 CAPSULE BY MOUTH  DAILY (Patient taking differently: Take 60 mg by mouth at bedtime.) 90 capsule 3  ? finasteride (PROSCAR) 5 MG tablet TAKE 1 TABLET BY MOUTH AT  BEDTIME 90 tablet 3  ? glucagon 1 MG injection Inject 1 mg into the vein once as needed. 1 each 12  ? glucose blood (BAYER CONTOUR NEXT TEST) test strip MEDICALLY NECESSARY FOR USE WITH PUMP; Use to check blood sugar 5 times per day and prn; E11.42 500 each 3  ? Immune Globulin 10% (IMMUNE GLOBULIN 10%) 10G/175m (10,0037m100mL) SOLN Inject 105 g into the vein every 6 (six) weeks. 289.5 mL 10  ?  Insulin Human (INSULIN PUMP) SOLN Inject 120 each into the skin daily. HUMALOG    ? Insulin Infusion Pump Supplies (PARADIGM RESERVOIR 3ML) MISC 1 Device by Does not apply route every 3 (three) days. 30 each 3  ? Insulin Infusion Pump Supplies (QUICK-SET INFUSION 43" 9MM) MISC 1 Device by Does not apply route every 3 (three) days. 30 each 3  ? insulin lispro (HUMALOG) 100 UNIT/ML injection Inject into the skin. Via insulin pump    ? lidocaine (LIDODERM) 5 % At 7 am and remove at 7 pm. Apply 1 on each side. 180 patch 2  ? lisinopril-hydrochlorothiazide (ZESTORETIC) 10-12.5 MG  tablet TAKE 1 TABLET BY MOUTH AT  BEDTIME 90 tablet 3  ? methocarbamol (ROBAXIN) 750 MG tablet TAKE 1 TABLET BY MOUTH 3  TIMES DAILY AS NEEDED FOR  MUSCLE SPASM(S) 90 tablet 1  ? Multiple Vitamin (MULTIVITAMIN WITH MINERALS) TABS tablet Take 1 tablet by mouth at bedtime.    ? oxyCODONE (OXY IR/ROXICODONE) 5 MG immediate release tablet Take 1-2 tablets (5-10 mg total) by mouth every 4 (four) hours as needed for severe pain. 42 tablet 0  ? polyethylene glycol (MIRALAX / GLYCOLAX) 17 g packet Take 17 g by mouth daily as needed for mild constipation. 14 each 0  ? prochlorperazine (COMPAZINE) 5 MG tablet Take 5 mg by mouth every 6 (six) hours as needed for vomiting or nausea.    ? pseudoephedrine (SUDAFED) 30 MG tablet Take 30 mg by mouth at bedtime as needed for congestion.    ? scopolamine (TRANSDERM-SCOP) 1 MG/3DAYS Place 1 patch (1.5 mg total) onto the skin every 3 (three) days. (Patient taking differently: Place 1 patch onto the skin every 3 (three) days as needed (nausea with motion sickness (cruise)).) 10 patch 1  ? ?No current facility-administered medications on file prior to visit.  ? ? ?No Known Allergies ? ?Family History  ?Problem Relation Age of Onset  ? Dementia Mother   ? Diabetes Mellitus I Mother   ? Hypertension Father   ?     89  ? Healthy Sister   ? Rheum arthritis Brother   ? Cancer Neg Hx   ? Colon cancer Neg Hx   ? Esophageal  cancer Neg Hx   ? Stomach cancer Neg Hx   ? Rectal cancer Neg Hx   ? Colon polyps Neg Hx   ? ? ?BP 132/76 (BP Location: Left Arm, Patient Position: Sitting, Cuff Size: Normal)   Pulse 92   Ht 6' (1.829 m)   Wt 222

## 2021-06-19 NOTE — Patient Instructions (Addendum)
Please take these pump settings:  ?basal rate of 4 units/hr 6 AM-10 PM, and 2.6 units/hr 10 PM-6 AM (when not in auto mode).   ?Mealtime bolus of 1 unit/3.5 grams carbohydrate.   ?correction bolus (which some people call "sensitivity," or "insulin sensitivity ratio," or just "isr") of 1 unit for each 50 by which your glucose exceeds 100.   ?suspend the pump for 1-2 hrs, with activity.  If not, eat a light snack with it.   ?On this type of insulin schedule, you should eat meals on a regular schedule.  If a meal is missed or significantly delayed, your blood sugar could go low.   ?You should have an endocrinology follow-up appointment in 4 months.  ? ?

## 2021-06-24 ENCOUNTER — Ambulatory Visit: Payer: Medicare Other | Admitting: Physical Therapy

## 2021-06-24 DIAGNOSIS — R531 Weakness: Secondary | ICD-10-CM

## 2021-06-24 DIAGNOSIS — R269 Unspecified abnormalities of gait and mobility: Secondary | ICD-10-CM

## 2021-06-24 DIAGNOSIS — M25562 Pain in left knee: Secondary | ICD-10-CM

## 2021-06-24 DIAGNOSIS — R2689 Other abnormalities of gait and mobility: Secondary | ICD-10-CM

## 2021-06-24 DIAGNOSIS — M6281 Muscle weakness (generalized): Secondary | ICD-10-CM

## 2021-06-24 NOTE — Therapy (Signed)
[facility] ?[address] ?[place], [place], [address] ?Phone: [phone]   Fax:  [phone] ? ?Physical Therapy Treatment ? ?Patient Details  ?Name: Kenneth Pace ?MRN: [id number] ?Date of Birth: 27-Oct-1950 ?Referring Provider (PT): Dr Paralee Cancel ? ? ?Encounter Date: 06/24/2021 ? ? PT End of Session - 06/24/21 1014   ? ? Visit Number 14   ? Number of Visits 24   ? Date for PT Re-Evaluation 07/31/21   ? Authorization - Visit Number 14   ? Progress Note Due on Visit 20   ? PT Start Time 1015   ? PT Stop Time 1100   ? PT Time Calculation (min) 45 min   ? Activity Tolerance Patient tolerated treatment well   ? Behavior During Therapy [facility] for tasks assessed/performed   ? ?  ?  ? ?  ? ? ?Past Medical History:  ?Diagnosis Date  ? ADENOIDECTOMY, HX OF 02/16/2007  ? Anxiety   ? pt. states he does not have anxiety.  ? BENIGN PROSTATIC HYPERTROPHY, WITH OBSTRUCTION 02/03/2010  ? Chronic inflammatory demyelinating polyneuropathy (HCC)   ? diagnosed 11/2016  ? DIABETES MELLITUS, TYPE I, UNCONTROLLED 12/29/2006  ? ERECTILE DYSFUNCTION 02/16/2007  ? Hearing loss   ? wears hearing aids  ? HYPERLIPIDEMIA 02/16/2007  ? HYPERTENSION 02/16/2007  ? Nerve pain   ? per patient "on lower back and both legs"  ? Sleep apnea   ? uses CPAP  ? TESTICULAR MASS, LEFT 02/16/2007  ? Therapeutic opioid-induced constipation (OIC)   ? ? ?Past Surgical History:  ?Procedure Laterality Date  ? ANTERIOR CERVICAL DECOMP/DISCECTOMY FUSION N/A 02/01/2017  ? Procedure: ANTERIOR CERVICAL DECOMPRESSION/DISCECTOMY FUSION CERVICAL THREE-FOUR , CERVICAL FOUR-FIVE  CERVICAL FIVE-SIX;  Surgeon: Consuella Lose, MD;  Location: [facility];  Service: Neurosurgery;  Laterality: N/A;  ? APPENDECTOMY    ? BACK SURGERY    ? COLONOSCOPY  02/05/2010  ? [facility]  ? EYE SURGERY Right   ? Dr. Katy Fitch  ? KNEE SURGERY    ? 2 Lknee arthroscopy, 3 R knee arthroscpoy  ? KNEE SURGERY Right 11/12/2016  ? LUMBAR FUSION   03/26/2020  ? LUMBAR LAMINECTOMY/DECOMPRESSION MICRODISCECTOMY Right 05/22/2019  ? Procedure: LAMINOTOMY AND MICRODISCECTOMY RIGHT LUMBAR FOUR- LUMBAR FIVE;  Surgeon: Consuella Lose, MD;  Location: [facility];  Service: Neurosurgery;  Laterality: Right;  LAMINOTOMY AND MICRODISCECTOMY RIGHT LUMBAR FOUR- LUMBAR FIVE  ? LUMBAR LAMINECTOMY/DECOMPRESSION MICRODISCECTOMY Right 02/01/2020  ? Procedure: REDO MICRODISCECTOMY RT LUMBAR FOUR-FIVE;  Surgeon: Consuella Lose, MD;  Location: [facility];  Service: Neurosurgery;  Laterality: Right;  posterior  ? TONSILLECTOMY    ? 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 Procedure: TOTAL KNEE ARTHROPLASTY;  Surgeon: Paralee Cancel, MD;  Location: [facility];  Service: Orthopedics;  Laterality: Left;  ? ? ?There were no vitals filed for this visit. ? ? Subjective Assessment - 06/24/21 1017   ? ? Subjective Pt states he saw ortho and encourages more knee extension.   ? Pertinent History AODM; IVIG for guillain barre; arthritis; multiple lumbar and cervical surgeries/fusions; sleep apnea   ? Patient Stated Goals walking with cane independently   ? Currently in Pain? Yes   ? Pain Score 5    ? Pain Location Knee   ? Pain Orientation Left   ? Pain Descriptors / Indicators Tightness   ? Pain Type Surgical pain;Chronic pain   ? ?  ?  ? ?  ? ? ? ? ? [facility] PT Assessment - 06/24/21  0001   ? ?  ? Assessment  ? Medical Diagnosis Lt TKA   ? Referring Provider (PT) Dr Paralee Cancel   ?  ? AROM  ? Left Knee Extension -10   ? Left Knee Flexion 115   ? ?  ?  ? ?  ? ? ? ? ? ? ? ? ? ? ? ? ? ? ? ? [facility] Adult PT Treatment/Exercise - 06/24/21 0001   ? ?  ? Knee/Hip Exercises: Stretches  ? Passive Hamstring Stretch Left;60 seconds;2 reps   ? Passive Hamstring Stretch Limitations seated   ? Knee: Self-Stretch to increase Flexion Left;60 seconds   ? Knee: Self-Stretch Limitations prone using strap   ? Other Knee/Hip Stretches knee extension stretch with bolster under ankle x2 min   ?  ? Knee/Hip Exercises: Aerobic  ?  Recumbent Bike L3 x 5 min   ?  ? Knee/Hip Exercises: Standing  ? Lateral Step Up Left;2 sets;10 reps;Step Height: 6"   ? Lateral Step Up Limitations focus on straightening with step up   ? Forward Step Up Left;10 reps;Hand Hold: 0;Hand Hold: 2;Step Height: 6"   ? Forward Step Up Limitations focus on straightening with step up   ? Step Down Left;10 reps;Step Height: 6"   ?  ? Knee/Hip Exercises: Supine  ? Quad Sets Left;10 reps;2 sets   ? Other Supine Knee/Hip Exercises SLR with strap assist for quad setting x20   ?  ? Knee/Hip Exercises: Prone  ? Hamstring Curl 2 sets;10 reps   ? Hamstring Curl Limitations 2.5#   ? Hip Extension --   ? Hip Extension Limitations --   ?  ? Manual Therapy  ? Soft tissue mobilization IASTM L hamstring   ? Passive ROM Passive stretch PT assist into knee extension 3x1 min heel supported on bolster   ? ?  ?  ? ?  ? ? ? ? ? ? ? ? ? ? ? ? PT Short Term Goals - 06/17/21 1054   ? ?  ? PT SHORT TERM GOAL #1  ? Title Independent in initial HEP   ? Status Achieved   ?  ? PT SHORT TERM GOAL #2  ? Title Increase AAROMAROM Lt knee to -10 deg extension and 100 degrees flexion   ? Status Achieved   ?  ? PT SHORT TERM GOAL #3  ? Title Independent in ambulation with walking for functional, community distances   ? Baseline using SPC   ? Status On-going   ? ?  ?  ? ?  ? ? ? ? PT Long Term Goals - 05/08/21 1345   ? ?  ? PT LONG TERM GOAL #1  ? Title AROM Lt knee flexion 105 degrees and extension 0 to (-)3 degrees   ? Baseline -   ? Time 12   ? Period Weeks   ? Status New   ? Target Date 07/31/21   ?  ? PT LONG TERM GOAL #2  ? Title The patient will improve LE strength to 4+/5 to 5-/5 hip flexion, extension and abduction; knee flexion/extension   ? Baseline -   ? Time 12   ? Period Weeks   ? Status New   ? Target Date 07/31/21   ?  ? PT LONG TERM GOAL #3  ? Title Patient to demonstrate safe independent gait with assistive device as indicated for community distances   ? Baseline -   ? Time 12   ?  Period  Weeks   ? Status New   ? Target Date 07/31/21   ?  ? PT LONG TERM GOAL #4  ? Title Independent in [place] program as indicated   ? Baseline -   ? Time 12   ? Period Weeks   ? Status New   ? Target Date 07/31/21   ?  ? PT LONG TERM GOAL #5  ? Title Improve functional limitation score to 40   ? Baseline -   ? Time 12   ? Period Weeks   ? Status New   ? Target Date 07/31/21   ? ?  ?  ? ?  ? ? ? ? ? ? ? ? Plan - 06/24/21 1041   ? ? Clinical Impression Statement Continued to work on improving knee extension and general strengthening.   ? Personal Factors and Comorbidities Comorbidity 1;Comorbidity 2;Comorbidity 3+   ? 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 Examination-Activity Limitations Bathing;Locomotion Level;Bed Mobility;Transfers;Bend;Carry;Dressing;Hygiene/Grooming;Lift;Sit;Sleep;Squat;Stairs;Stand;Toileting   ? Rehab Potential Good   ? PT Frequency 2x / week   ? PT Duration 12 weeks   ? PT Treatment/Interventions ADLs/Self Care Home Management;Aquatic Therapy;Cryotherapy;Electrical Stimulation;Iontophoresis '4mg'$ /ml Dexamethasone;Moist Heat;Ultrasound;Gait training;Stair training;Functional mobility training;Therapeutic activities;Therapeutic exercise;Balance training;Neuromuscular re-education;Patient/family education;Manual techniques;Scar mobilization;Passive range of motion;Dry needling;Taping;Vasopneumatic Device   ? PT Next Visit Plan focus on knee and hip extension; gait training; progress with knee rehaba as indicated   ? PT [place]   ? Consulted and Agree with Plan of Care Patient   ? ?  ?  ? ?  ? ? ?Patient will benefit from skilled therapeutic intervention in order to improve the following deficits and impairments:  Abnormal gait, Decreased coordination, Decreased range of motion, Difficulty walking, Decreased activity tolerance, Pain, Decreased balance, Impaired flexibility, Decreased mobility, Decreased strength,  Increased edema, Postural dysfunction ? ?Visit Diagnosis: ?Acute pain of left knee ? ?Weakness generalized ? ?Abnormal gait ? ?Muscle weakness (generalized) ? ?Other abnormalities of gait and mobility ? ? ? ?

## 2021-06-27 ENCOUNTER — Encounter: Payer: Medicare Other | Admitting: Physical Therapy

## 2021-07-02 ENCOUNTER — Encounter: Payer: Self-pay | Admitting: Rehabilitative and Restorative Service Providers"

## 2021-07-02 ENCOUNTER — Ambulatory Visit: Payer: Medicare Other | Admitting: Rehabilitative and Restorative Service Providers"

## 2021-07-02 DIAGNOSIS — R531 Weakness: Secondary | ICD-10-CM

## 2021-07-02 DIAGNOSIS — M25469 Effusion, unspecified knee: Secondary | ICD-10-CM

## 2021-07-02 DIAGNOSIS — R2689 Other abnormalities of gait and mobility: Secondary | ICD-10-CM

## 2021-07-02 DIAGNOSIS — R269 Unspecified abnormalities of gait and mobility: Secondary | ICD-10-CM

## 2021-07-02 DIAGNOSIS — M25562 Pain in left knee: Secondary | ICD-10-CM | POA: Diagnosis not present

## 2021-07-02 DIAGNOSIS — M6281 Muscle weakness (generalized): Secondary | ICD-10-CM

## 2021-07-02 NOTE — Therapy (Signed)
[place] [facility] ?[place], [place], [address] ?Phone: [phone]   Fax:  [phone] ? ?Physical Therapy Treatment ? ?Patient Details  ?Name: Kenneth Pace ?MRN: [id number] ?Date of Birth: 04/18/1950 ?Referring Provider (PT): Dr Paralee Cancel ? ? ?Encounter Date: 07/02/2021 ? ? PT End of Session - 07/02/21 1034   ? ? Visit Number 15   ? Number of Visits 24   ? Date for PT Re-Evaluation 07/31/21   ? Authorization - Visit Number 15   ? PT Start Time 1003   ? PT Stop Time 1052   ? PT Time Calculation (min) 49 min   ? Activity Tolerance Patient tolerated treatment well   ? ?  ?  ? ?  ? ? ?Past Medical History:  ?Diagnosis Date  ? ADENOIDECTOMY, HX OF 02/16/2007  ? Anxiety   ? pt. states he does not have anxiety.  ? BENIGN PROSTATIC HYPERTROPHY, WITH OBSTRUCTION 02/03/2010  ? Chronic inflammatory demyelinating polyneuropathy (HCC)   ? diagnosed 11/2016  ? DIABETES MELLITUS, TYPE I, UNCONTROLLED 12/29/2006  ? ERECTILE DYSFUNCTION 02/16/2007  ? Hearing loss   ? wears hearing aids  ? HYPERLIPIDEMIA 02/16/2007  ? HYPERTENSION 02/16/2007  ? Nerve pain   ? per patient "on lower back and both legs"  ? Sleep apnea   ? uses CPAP  ? TESTICULAR MASS, LEFT 02/16/2007  ? Therapeutic opioid-induced constipation (OIC)   ? ? ?Past Surgical History:  ?Procedure Laterality Date  ? ANTERIOR CERVICAL DECOMP/DISCECTOMY FUSION N/A 02/01/2017  ? Procedure: ANTERIOR CERVICAL DECOMPRESSION/DISCECTOMY FUSION CERVICAL THREE-FOUR , CERVICAL FOUR-FIVE  CERVICAL FIVE-SIX;  Surgeon: Consuella Lose, MD;  Location: [place];  Service: Neurosurgery;  Laterality: N/A;  ? APPENDECTOMY    ? BACK SURGERY    ? COLONOSCOPY  02/05/2010  ? [facility]  ? EYE SURGERY Right   ? Dr. Katy Fitch  ? KNEE SURGERY    ? 2 Lknee arthroscopy, 3 R knee arthroscpoy  ? KNEE SURGERY Right 11/12/2016  ? LUMBAR FUSION  03/26/2020  ? LUMBAR LAMINECTOMY/DECOMPRESSION MICRODISCECTOMY Right 05/22/2019  ? Procedure:  LAMINOTOMY AND MICRODISCECTOMY RIGHT LUMBAR FOUR- LUMBAR FIVE;  Surgeon: Consuella Lose, MD;  Location: [place];  Service: Neurosurgery;  Laterality: Right;  LAMINOTOMY AND MICRODISCECTOMY RIGHT LUMBAR FOUR- LUMBAR FIVE  ? LUMBAR LAMINECTOMY/DECOMPRESSION MICRODISCECTOMY Right 02/01/2020  ? Procedure: REDO MICRODISCECTOMY RT LUMBAR FOUR-FIVE;  Surgeon: Consuella Lose, MD;  Location: [place];  Service: Neurosurgery;  Laterality: Right;  posterior  ? TONSILLECTOMY    ? 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 Procedure: TOTAL KNEE ARTHROPLASTY;  Surgeon: Paralee Cancel, MD;  Location: [facility];  Service: Orthopedics;  Laterality: Left;  ? ? ?There were no vitals filed for this visit. ? ? Subjective Assessment - 07/02/21 1035   ? ? Subjective Kenneth Pace reports that he has some discomfort in the knee. He continues to work on straightening the knee   ? Currently in Pain? Yes   ? Pain Score 3    ? Pain Location Knee   ? Pain Orientation Left   ? Pain Descriptors / Indicators Tightness;Sore   ? Pain Type Surgical pain;Chronic pain   ? Pain Onset More than a month ago   ? Pain Frequency Intermittent   ? ?  ?  ? ?  ? ? ? ? ? [facility] PT Assessment - 07/02/21 0001   ? ?  ? Assessment  ? Medical Diagnosis Lt TKA   ? Referring Provider (PT) Dr Paralee Cancel   ?  Onset Date/Surgical Date 05/06/21   ? Hand Dominance Right   ? Prior Therapy here for LBP   ?  ? AROM  ? Left Knee Extension -6   ?  ? PROM  ? Left Knee Extension -4   ? ?  ?  ? ?  ? ? ? ? ? ? ? ? ? ? ? ? ? ? ? ? [facility] Adult PT Treatment/Exercise - 07/02/21 0001   ? ?  ? Ambulation/Gait  ? Ambulation Distance (Feet) 160 Feet   ? Assistive device Straight cane   ? Gait Comments backward walking 40 ft x 4; side steps 40 ft x 4 each direction   ?  ? Knee/Hip Exercises: Stretches  ? Passive Hamstring Stretch Left;60 seconds;2 reps   ? Passive Hamstring Stretch Limitations seated   ? Other Knee/Hip Stretches knee extension stretch with bolster under ankle x2 min   ?  ? Knee/Hip  Exercises: Aerobic  ? Recumbent Bike L3 x 5 min   ? Nustep L5 x 8 min   ?  ? Knee/Hip Exercises: Standing  ? Terminal Knee Extension Strengthening;Left;20 reps;Theraband   ? Theraband Level (Terminal Knee Extension) Level 4 (Blue)   ? Terminal Knee Extension Limitations repeated pressing into coregeous ball 10 sec x 10 reps back to wall focus on wt bearing Lt LE   ? Lateral Step Up Left;10 reps;Step Height: 6"   ? Lateral Step Up Limitations focus on straightening with step up   ? Forward Step Up Left;10 reps;Hand Hold: 0;Hand Hold: 2;Step Height: 6"   ? Forward Step Up Limitations focus on straightening with step up   ?  ? Knee/Hip Exercises: Supine  ? Quad Sets Left;10 reps;2 sets   ? Quad Sets Limitations heel on foam roll   ? Short Arc [employer] 2 sets;10 reps   ? Short Arc [employer] Limitations green bolster   ?  ? Manual Therapy  ? Passive ROM Passive stretch PT assist into knee extension 3 reps 10 sec hold heel supported on foam roll   ? ?  ?  ? ?  ? ? ? ? ? ? ? ? ? ? ? ? PT Short Term Goals - 06/17/21 1054   ? ?  ? PT SHORT TERM GOAL #1  ? Title Independent in initial HEP   ? Status Achieved   ?  ? PT SHORT TERM GOAL #2  ? Title Increase AAROMAROM Lt knee to -10 deg extension and 100 degrees flexion   ? Status Achieved   ?  ? PT SHORT TERM GOAL #3  ? Title Independent in ambulation with walking for functional, community distances   ? Baseline using SPC   ? Status On-going   ? ?  ?  ? ?  ? ? ? ? PT Long Term Goals - 05/08/21 1345   ? ?  ? PT LONG TERM GOAL #1  ? Title AROM Lt knee flexion 105 degrees and extension 0 to (-)3 degrees   ? Baseline -   ? Time 12   ? Period Weeks   ? Status New   ? Target Date 07/31/21   ?  ? PT LONG TERM GOAL #2  ? Title The patient will improve LE strength to 4+/5 to 5-/5 hip flexion, extension and abduction; knee flexion/extension   ? Baseline -   ? Time 12   ? Period Weeks   ? Status New   ? Target Date 07/31/21   ?  ?  PT LONG TERM GOAL #3  ? Title Patient to demonstrate safe  independent gait with assistive device as indicated for community distances   ? Baseline -   ? Time 12   ? Period Weeks   ? Status New   ? Target Date 07/31/21   ?  ? PT LONG TERM GOAL #4  ? Title Independent in [place] program as indicated   ? Baseline -   ? Time 12   ? Period Weeks   ? Status New   ? Target Date 07/31/21   ?  ? PT LONG TERM GOAL #5  ? Title Improve functional limitation score to 40   ? Baseline -   ? Time 12   ? Period Weeks   ? Status New   ? Target Date 07/31/21   ? ?  ?  ? ?  ? ? ? ? ? ? ? ? Plan - 07/02/21 1056   ? ? Clinical Impression Statement Continued work on [employer]. Knee extension gradually improving.   ? Rehab Potential Good   ? PT Frequency 2x / week   ? PT Duration 12 weeks   ? PT Treatment/Interventions ADLs/Self Care Home Management;Aquatic Therapy;Cryotherapy;Electrical Stimulation;Iontophoresis '4mg'$ /ml Dexamethasone;Moist Heat;Ultrasound;Gait training;Stair training;Functional mobility training;Therapeutic activities;Therapeutic exercise;Balance training;Neuromuscular re-education;Patient/family education;Manual techniques;Scar mobilization;Passive range of motion;Dry needling;Taping;Vasopneumatic Device   ? PT Next Visit Plan focus on knee and hip extension; gait training; progress with knee rehaba as indicated   ? PT [place]   ? Consulted and Agree with Plan of Care Patient   ? ?  ?  ? ?  ? ? ?Patient will benefit from skilled therapeutic intervention in order to improve the following deficits and impairments:    ? ?Visit Diagnosis: ?Acute pain of left knee ? ?Weakness generalized ? ?Abnormal gait ? ?Muscle weakness (generalized) ? ?Other abnormalities of gait and mobility ? ?Edema of knee ? ? ? ? ?Problem List ?Patient Active Problem List  ? Diagnosis Date Noted  ? 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 Lumbar herniated disc 02/01/2020  ? Lumbar radiculopathy 05/22/2019  ? Spastic gait 03/07/2019  ? Myalgia 11/01/2018  ? Chronic  bilateral low back pain without sciatica 07/21/2018  ? 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 CIDP (chronic inflammatory demyelinating polyneuropathy) ([place]) 12/30/2017  ? Edema 03/01/2017  ? Hypokalemia   ? Slow

## 2021-07-08 ENCOUNTER — Ambulatory Visit: Payer: Medicare Other | Admitting: Physical Therapy

## 2021-07-08 DIAGNOSIS — M25562 Pain in left knee: Secondary | ICD-10-CM | POA: Diagnosis not present

## 2021-07-08 DIAGNOSIS — R269 Unspecified abnormalities of gait and mobility: Secondary | ICD-10-CM

## 2021-07-08 DIAGNOSIS — R531 Weakness: Secondary | ICD-10-CM

## 2021-07-08 NOTE — Therapy (Signed)
[place], [place], [address] Phone: [phone]   Fax:  [phone]  Physical Therapy Treatment  Patient Details  Name: Kenneth Pace MRN: [id number] Date of Birth: 02-14-1951 Referring Provider (PT): Dr Paralee Cancel   Encounter Date: 07/08/2021 Rationale for Evaluation and Treatment Rehabilitation    PT End of Session - 07/08/21 1054     Visit Number 16    Number of Visits 24    Date for PT Re-Evaluation 07/31/21    Authorization - Visit Number 16    Progress Note Due on Visit 20    PT Start Time 1027    PT Stop Time 1055    PT Time Calculation (min) 40 min    Activity Tolerance Patient tolerated treatment well    Behavior During Therapy [facility] for tasks assessed/performed             Past Medical History:  Diagnosis Date   ADENOIDECTOMY, HX OF 02/16/2007   Anxiety    pt. states he does not have anxiety.   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[place])    diagnosed 11/2016   DIABETES MELLITUS, TYPE I, UNCONTROLLED 12/29/2006   ERECTILE DYSFUNCTION 02/16/2007   Hearing loss    wears hearing aids   HYPERLIPIDEMIA 02/16/2007   HYPERTENSION 02/16/2007   Nerve pain    per patient "on lower back and both legs"   Sleep apnea    uses CPAP   TESTICULAR MASS, LEFT 02/16/2007   Therapeutic opioid-induced constipation (OIC)     Past Surgical History:  Procedure Laterality Date   ANTERIOR CERVICAL DECOMP/DISCECTOMY FUSION N/A 02/01/2017   Procedure: ANTERIOR CERVICAL DECOMPRESSION/DISCECTOMY FUSION CERVICAL THREE-FOUR , CERVICAL FOUR-FIVE  CERVICAL FIVE-SIX;  Surgeon: Consuella Lose, MD;  Location: [facility];  Service: Neurosurgery;  Laterality: N/A;   APPENDECTOMY     BACK SURGERY     COLONOSCOPY  02/05/2010   [facility]   EYE SURGERY Right    Dr. Katy Fitch   KNEE SURGERY     2 Lknee arthroscopy, 3 R knee  arthroscpoy   KNEE SURGERY Right 11/12/2016   LUMBAR FUSION  03/26/2020   LUMBAR LAMINECTOMY/DECOMPRESSION MICRODISCECTOMY Right 05/22/2019   Procedure: LAMINOTOMY AND MICRODISCECTOMY RIGHT LUMBAR FOUR- LUMBAR FIVE;  Surgeon: Consuella Lose, MD;  Location: [facility];  Service: Neurosurgery;  Laterality: Right;  LAMINOTOMY AND MICRODISCECTOMY RIGHT LUMBAR FOUR- LUMBAR FIVE   LUMBAR LAMINECTOMY/DECOMPRESSION MICRODISCECTOMY Right 02/01/2020   Procedure: REDO MICRODISCECTOMY RT LUMBAR FOUR-FIVE;  Surgeon: Consuella Lose, MD;  Location: [facility];  Service: Neurosurgery;  Laterality: Right;  posterior   TONSILLECTOMY     TOTAL KNEE ARTHROPLASTY Left 05/06/2021   Procedure: TOTAL KNEE ARTHROPLASTY;  Surgeon: Paralee Cancel, MD;  Location[facility];  Service: Orthopedics;  Laterality: Left;    There were no vitals filed for this visit.   Subjective Assessment - 07/08/21 1016     Subjective Pt states that when he feels pain he is able to sit down for 15 minutes and the pain decreases    Patient Stated Goals walking with cane independently    Currently in Pain? Yes    Pain Score 4     Pain Location Knee    Pain Orientation Left                [facility] PT Assessment - 07/08/21 0001       Assessment   Medical Diagnosis Lt TKA  Referring Provider (PT) Dr Paralee Cancel    Onset Date/Surgical Date 05/06/21      AROM   Left Knee Extension -3      PROM   Left Knee Extension -2                           [facility] Adult PT Treatment/Exercise - 07/08/21 0001       Ambulation/Gait   Gait Comments backward walking 40' x 4 CGA with SPC      Knee/Hip Exercises: Stretches   Passive Hamstring Stretch Left;60 seconds;2 reps    Passive Hamstring Stretch Limitations seated      Knee/Hip Exercises: Aerobic   Recumbent Bike L3 x 5 min      Knee/Hip Exercises: Standing   Terminal Knee Extension Strengthening;Left;20 reps;Theraband    Theraband Level (Terminal Knee Extension) Level  4 (Blue)    Terminal Knee Extension Limitations repeated pressing into coregeous ball 10 sec x 10 reps back to wall focus on wt bearing Lt LE    Lateral Step Up Left;2 sets;10 reps;Hand Hold: 1;Step Height: 6"    Lateral Step Up Limitations focus on straightening with step up    Forward Step Up Left;2 sets;Hand Hold: 1;10 reps;Step Height: 6"    Forward Step Up Limitations focus on straightening with step up      Knee/Hip Exercises: Supine   Quad Sets Left;10 reps;2 sets    Short Arc Quad Sets 2 sets;10 reps    Short Arc Quad Sets Limitations green bolster    Straight Leg Raises 10 reps    Straight Leg Raises Limitations cues for quad set to reduce lag      Manual Therapy   Passive ROM Passive stretch PT assist into knee extension 3 reps 10 sec hold heel supported on foam roll                       PT Short Term Goals - 06/17/21 1054       PT SHORT TERM GOAL #1   Title Independent in initial HEP    Status Achieved      PT SHORT TERM GOAL #2   Title Increase AAROMAROM Lt knee to -10 deg extension and 100 degrees flexion    Status Achieved      PT SHORT TERM GOAL #3   Title Independent in ambulation with walking for functional, community distances    Baseline using SPC    Status On-going               PT Long Term Goals - 05/08/21 1345       PT LONG TERM GOAL #1   Title AROM Lt knee flexion 105 degrees and extension 0 to (-)3 degrees    Baseline -    Time 12    Period Weeks    Status New    Target Date 07/31/21      PT LONG TERM GOAL #2   Title The patient will improve LE strength to 4+/5 to 5-/5 hip flexion, extension and abduction; knee flexion/extension    Baseline -    Time 12    Period Weeks    Status New    Target Date 07/31/21      PT LONG TERM GOAL #3   Title Patient to demonstrate safe independent gait with assistive device as indicated for community distances    Baseline -    Time 12  Period Weeks    Status New    Target Date  07/31/21      PT LONG TERM GOAL #4   Title Independent in HEP inclusind aquatic program as indicated    Baseline -    Time 12    Period Weeks    Status New    Target Date 07/31/21      PT LONG TERM GOAL #5   Title Improve functional limitation score to 40    Baseline -    Time 12    Period Weeks    Status New    Target Date 07/31/21                   Plan - 07/08/21 1055     Clinical Impression Statement Pt continues to improve LT knee extension. Improving activity tolerance and strength    PT Next Visit Plan focus on knee and hip extension; gait training; progress with knee rehab as indicated    PT [place] and Agree with Plan of Care Patient             Patient will benefit from skilled therapeutic intervention in order to improve the following deficits and impairments:     Visit Diagnosis: Acute pain of left knee  Weakness generalized  Abnormal gait     Problem List Patient Active Problem List   Diagnosis Date Noted   S/P total knee arthroplasty, left 05/06/2021   Lumbar herniated disc 02/01/2020   Lumbar radiculopathy 05/22/2019   Spastic gait 03/07/2019   Myalgia 11/01/2018   Chronic bilateral low back pain without sciatica 07/21/2018   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   CIDP (chronic inflammatory demyelinating polyneuropathy) ([place]) 12/30/2017   Edema 03/01/2017   Hypokalemia    Slow transit constipation    Steroid-induced hyperglycemia    Urinary retention    Accidental drug overdose    Postoperative pain    Hyponatremia    Cervical myelopathy (HCC) 02/01/2017   Shortness of breath at rest    Hypoglycemia    Spondylosis, cervical, with myelopathy    Neuropathic pain    Benign essential HTN    Labile blood glucose    Muscle spasm    GAD (generalized anxiety disorder)    Acute inflammatory demyelinating polyneuropathy ([place]) 01/11/2017   Anxiety state    Neurogenic bladder    Depression  12/30/2016   Leg weakness, bilateral 12/30/2016   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[place]) 12/30/2016   GBS (Guillain Barre syndrome) ([place])    AIDP (acute inflammatory demyelinating polyneuropathy) ([place]) 11/27/2016   Weakness 11/20/2016   Weakness of both arms 10/30/2016   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   Obstructive sleep apnea 03/24/2016   Numbness 03/18/2016   Mild nonproliferative diabetic retinopathy of both eyes without macular edema associated with type 2 diabetes mellitus ([place]) 05/24/2015   Nuclear cataract of both eyes 05/24/2015   Diabetes mellitus without complication ([place]) 41/66/0630   Chronic prostatitis 03/01/2015   Eustachian tube dysfunction 02/12/2015   Acute maxillary sinusitis 02/12/2015   Wellness examination 08/22/2014   Alkaline phosphatase elevation 08/22/2014   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   Benign prostatic hyperplasia with urinary frequency 01/31/2014   Lesion of eyelid 09/26/2013   Screening for prostate cancer 04/24/2013   Diabetes ([place]) 06/16/2012   ED (erectile dysfunction) of organic origin 06/03/2012   Routine general medical examination at a health care facility 04/10/2012  BPH (benign prostatic hyperplasia) 02/03/2010   HEMOCCULT POSITIVE STOOL 05/25/2008   ELBOW PAIN, RIGHT 07/04/2007   Dyslipidemia 02/16/2007   ANXIETY 02/16/2007   ERECTILE DYSFUNCTION 02/16/2007   Essential hypertension 02/16/2007   TESTICULAR MASS, LEFT 02/16/2007   Chronic pain of left knee 02/16/2007   ADENOIDECTOMY, HX OF 02/16/2007    Xayne Brumbaugh, PT 07/08/2021, 10:57 AM  [facility] [place], [place], [address] Phone: [phone]   Fax:  [phone]  Name: Kenneth Pace MRN: [id number] Date of Birth: 08/19/1950

## 2021-07-10 ENCOUNTER — Ambulatory Visit: Payer: Medicare Other | Admitting: Physical Therapy

## 2021-07-10 DIAGNOSIS — R269 Unspecified abnormalities of gait and mobility: Secondary | ICD-10-CM

## 2021-07-10 DIAGNOSIS — R531 Weakness: Secondary | ICD-10-CM

## 2021-07-10 DIAGNOSIS — M25562 Pain in left knee: Secondary | ICD-10-CM

## 2021-07-10 NOTE — Therapy (Signed)
[place], [place], [address] Phone: [phone]   Fax:  [phone]  Physical Therapy Treatment  Patient Details  Name: Kenneth Pace MRN: [id number] Date of Birth: Oct 09, 1950 Referring Provider (PT): Dr Paralee Cancel   Encounter Date: 07/10/2021 Rationale for Evaluation and Treatment Rehabilitation   PT End of Session - 07/10/21 1045     Visit Number 17    Number of Visits 24    Date for PT Re-Evaluation 07/31/21    Authorization - Visit Number 17    Progress Note Due on Visit 20    PT Start Time 1000    PT Stop Time 1045    PT Time Calculation (min) 45 min    Activity Tolerance Patient tolerated treatment well    Behavior During Therapy [facility] for tasks assessed/performed             Past Medical History:  Diagnosis Date   ADENOIDECTOMY, HX OF 02/16/2007   Anxiety    pt. states he does not have anxiety.   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[place])    diagnosed 11/2016   DIABETES MELLITUS, TYPE I, UNCONTROLLED 12/29/2006   ERECTILE DYSFUNCTION 02/16/2007   Hearing loss    wears hearing aids   HYPERLIPIDEMIA 02/16/2007   HYPERTENSION 02/16/2007   Nerve pain    per patient "on lower back and both legs"   Sleep apnea    uses CPAP   TESTICULAR MASS, LEFT 02/16/2007   Therapeutic opioid-induced constipation (OIC)     Past Surgical History:  Procedure Laterality Date   ANTERIOR CERVICAL DECOMP/DISCECTOMY FUSION N/A 02/01/2017   Procedure: ANTERIOR CERVICAL DECOMPRESSION/DISCECTOMY FUSION CERVICAL THREE-FOUR , CERVICAL FOUR-FIVE  CERVICAL FIVE-SIX;  Surgeon: Consuella Lose, MD;  Location: [place];  Service: Neurosurgery;  Laterality: N/A;   APPENDECTOMY     BACK SURGERY     COLONOSCOPY  02/05/2010   [facility]   EYE SURGERY Right    Dr. Katy Fitch   KNEE SURGERY     2 Lknee arthroscopy, 3 R knee  arthroscpoy   KNEE SURGERY Right 11/12/2016   LUMBAR FUSION  03/26/2020   LUMBAR LAMINECTOMY/DECOMPRESSION MICRODISCECTOMY Right 05/22/2019   Procedure: LAMINOTOMY AND MICRODISCECTOMY RIGHT LUMBAR FOUR- LUMBAR FIVE;  Surgeon: Consuella Lose, MD;  Location: [place];  Service: Neurosurgery;  Laterality: Right;  LAMINOTOMY AND MICRODISCECTOMY RIGHT LUMBAR FOUR- LUMBAR FIVE   LUMBAR LAMINECTOMY/DECOMPRESSION MICRODISCECTOMY Right 02/01/2020   Procedure: REDO MICRODISCECTOMY RT LUMBAR FOUR-FIVE;  Surgeon: Consuella Lose, MD;  Location: [place];  Service: Neurosurgery;  Laterality: Right;  posterior   TONSILLECTOMY     TOTAL KNEE ARTHROPLASTY Left 05/06/2021   Procedure: TOTAL KNEE ARTHROPLASTY;  Surgeon: Paralee Cancel, MD;  Location: [facility];  Service: Orthopedics;  Laterality: Left;    There were no vitals filed for this visit.   Subjective Assessment - 07/10/21 1010     Subjective Pt states no new complaints    Patient Stated Goals walking with cane independently    Currently in Pain? Yes    Pain Score 2     Pain Location Knee    Pain Orientation Left    Pain Descriptors / Indicators Tightness                [facility] PT Assessment - 07/10/21 0001       Assessment   Medical Diagnosis Lt TKA    Referring Provider (PT) Dr Paralee Cancel  Onset Date/Surgical Date 05/06/21      AROM   Left Knee Extension -2    Left Knee Flexion 125                           [facility] Adult PT Treatment/Exercise - 07/10/21 0001       Ambulation/Gait   Gait Comments treamdill at 1.12mh 5-8 % incline x 5 min      Knee/Hip Exercises: Stretches   Passive Hamstring Stretch Left;60 seconds;2 reps    Passive Hamstring Stretch Limitations seated      Knee/Hip Exercises: Aerobic   Nustep L5 x 8 min      Knee/Hip Exercises: Standing   Lateral Step Up Left;2 sets;10 reps;Hand Hold: 1;Step Height: 6"    Lateral Step Up Limitations focus on straightening with step up    Forward Step Up  Left;2 sets;Hand Hold: 1;10 reps;Step Height: 6"    Forward Step Up Limitations focus on straightening with step up      Knee/Hip Exercises: Seated   Sit to Sand 20 reps      Manual Therapy   Passive ROM PROM in prone into flexion and extension                       PT Short Term Goals - 06/17/21 1054       PT SHORT TERM GOAL #1   Title Independent in initial HEP    Status Achieved      PT SHORT TERM GOAL #2   Title Increase AAROMAROM Lt knee to -10 deg extension and 100 degrees flexion    Status Achieved      PT SHORT TERM GOAL #3   Title Independent in ambulation with walking for functional, community distances    Baseline using SPC    Status On-going               PT Long Term Goals - 05/08/21 1345       PT LONG TERM GOAL #1   Title AROM Lt knee flexion 105 degrees and extension 0 to (-)3 degrees    Baseline -    Time 12    Period Weeks    Status New    Target Date 07/31/21      PT LONG TERM GOAL #2   Title The patient will improve LE strength to 4+/5 to 5-/5 hip flexion, extension and abduction; knee flexion/extension    Baseline -    Time 12    Period Weeks    Status New    Target Date 07/31/21      PT LONG TERM GOAL #3   Title Patient to demonstrate safe independent gait with assistive device as indicated for community distances    Baseline -    Time 12    Period Weeks    Status New    Target Date 07/31/21      PT LONG TERM GOAL #4   Title Independent in HEP inclusind aquatic program as indicated    Baseline -    Time 12    Period Weeks    Status New    Target Date 07/31/21      PT LONG TERM GOAL #5   Title Improve functional limitation score to 40    Baseline -    Time 12    Period Weeks    Status New    Target Date 07/31/21  Plan - 07/10/21 1045     Clinical Impression Statement Added uphill walking today, he may benefit from downhill walking to improve quad strength and stability. Pt  continues to progress well towards goals    PT Next Visit Plan working towards d/c by 07/31/21. functional knee strength and endurance    PT Home Exercise Plan [facility]    Consulted and Agree with Plan of Care Patient             Patient will benefit from skilled therapeutic intervention in order to improve the following deficits and impairments:     Visit Diagnosis: Acute pain of left knee  Abnormal gait  Weakness generalized     Problem List Patient Active Problem List   Diagnosis Date Noted   S/P total knee arthroplasty, left 05/06/2021   Lumbar herniated disc 02/01/2020   Lumbar radiculopathy 05/22/2019   Spastic gait 03/07/2019   Myalgia 11/01/2018   Chronic bilateral low back pain without sciatica 07/21/2018   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   CIDP (chronic inflammatory demyelinating polyneuropathy) ([place]) 12/30/2017   Edema 03/01/2017   Hypokalemia    Slow transit constipation    Steroid-induced hyperglycemia    Urinary retention    Accidental drug overdose    Postoperative pain    Hyponatremia    Cervical myelopathy ([facility]) 02/01/2017   Shortness of breath at rest    Hypoglycemia    Spondylosis, cervical, with myelopathy    Neuropathic pain    Benign essential HTN    Labile blood glucose    Muscle spasm    GAD (generalized anxiety disorder)    Acute inflammatory demyelinating polyneuropathy ([place]) 01/11/2017   Anxiety state    Neurogenic bladder    Depression 12/30/2016   Leg weakness, bilateral 12/30/2016   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[place]) 12/30/2016   GBS (Guillain Barre syndrome) ([facility])    AIDP (acute inflammatory demyelinating polyneuropathy) ([place]) 11/27/2016   Weakness 11/20/2016   Weakness of both arms 10/30/2016   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   Obstructive sleep apnea 03/24/2016   Numbness 03/18/2016   Mild nonproliferative diabetic retinopathy of both eyes without macular edema associated  with type 2 diabetes mellitus ([place]) 05/24/2015   Nuclear cataract of both eyes 05/24/2015   Diabetes mellitus without complication ([place]) 45/04/8880   Chronic prostatitis 03/01/2015   Eustachian tube dysfunction 02/12/2015   Acute maxillary sinusitis 02/12/2015   Wellness examination 08/22/2014   Alkaline phosphatase elevation 08/22/2014   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   Benign prostatic hyperplasia with urinary frequency 01/31/2014   Lesion of eyelid 09/26/2013   Screening for prostate cancer 04/24/2013   Diabetes ([place]) 06/16/2012   ED (erectile dysfunction) of organic origin 06/03/2012   Routine general medical examination at a health care facility 04/10/2012   BPH (benign prostatic hyperplasia) 02/03/2010   HEMOCCULT POSITIVE STOOL 05/25/2008   ELBOW PAIN, RIGHT 07/04/2007   Dyslipidemia 02/16/2007   ANXIETY 02/16/2007   ERECTILE DYSFUNCTION 02/16/2007   Essential hypertension 02/16/2007   TESTICULAR MASS, LEFT 02/16/2007   Chronic pain of left knee 02/16/2007   ADENOIDECTOMY, HX OF 02/16/2007    Maico Mulvehill, PT 07/10/2021, 10:47 AM  [facility] [place] [address] [place], [place], [address] Phone: [phone]   Fax:  [phone]  Name: Kenneth Pace MRN: [id number] Date of Birth: 06-Apr-1950

## 2021-07-15 ENCOUNTER — Ambulatory Visit: Payer: Medicare Other | Admitting: Physical Therapy

## 2021-07-15 DIAGNOSIS — M25469 Effusion, unspecified knee: Secondary | ICD-10-CM

## 2021-07-15 DIAGNOSIS — M6281 Muscle weakness (generalized): Secondary | ICD-10-CM

## 2021-07-15 DIAGNOSIS — R2689 Other abnormalities of gait and mobility: Secondary | ICD-10-CM

## 2021-07-15 DIAGNOSIS — R531 Weakness: Secondary | ICD-10-CM

## 2021-07-15 DIAGNOSIS — R269 Unspecified abnormalities of gait and mobility: Secondary | ICD-10-CM

## 2021-07-15 DIAGNOSIS — M25562 Pain in left knee: Secondary | ICD-10-CM | POA: Diagnosis not present

## 2021-07-15 NOTE — Therapy (Signed)
[place], [place], [address] Phone: [phone]   Fax:  [phone]  Physical Therapy Treatment  Patient Details  Name: Kenneth Pace MRN: [id number] Date of Birth: 08-07-1950 Referring Provider (PT): Dr Paralee Cancel   Encounter Date: 07/15/2021   PT End of Session - 07/15/21 1407     Visit Number 18    Number of Visits 24    Date for PT Re-Evaluation 07/31/21    Authorization - Visit Number 18    Progress Note Due on Visit 20    PT Start Time 9476    PT Stop Time 1445    PT Time Calculation (min) 40 min    Activity Tolerance Patient tolerated treatment well    Behavior During Therapy [facility] for tasks assessed/performed             Past Medical History:  Diagnosis Date   ADENOIDECTOMY, HX OF 02/16/2007   Anxiety    pt. states he does not have anxiety.   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[place])    diagnosed 11/2016   DIABETES MELLITUS, TYPE I, UNCONTROLLED 12/29/2006   ERECTILE DYSFUNCTION 02/16/2007   Hearing loss    wears hearing aids   HYPERLIPIDEMIA 02/16/2007   HYPERTENSION 02/16/2007   Nerve pain    per patient "on lower back and both legs"   Sleep apnea    uses CPAP   TESTICULAR MASS, LEFT 02/16/2007   Therapeutic opioid-induced constipation (OIC)     Past Surgical History:  Procedure Laterality Date   ANTERIOR CERVICAL DECOMP/DISCECTOMY FUSION N/A 02/01/2017   Procedure: ANTERIOR CERVICAL DECOMPRESSION/DISCECTOMY FUSION CERVICAL THREE-FOUR , CERVICAL FOUR-FIVE  CERVICAL FIVE-SIX;  Surgeon: Consuella Lose, MD;  Location: [place];  Service: Neurosurgery;  Laterality: N/A;   APPENDECTOMY     BACK SURGERY     COLONOSCOPY  02/05/2010   [facility]   EYE SURGERY Right    Dr. Katy Fitch   KNEE SURGERY     2 Lknee arthroscopy, 3 R knee arthroscpoy   KNEE SURGERY Right 11/12/2016   LUMBAR FUSION   03/26/2020   LUMBAR LAMINECTOMY/DECOMPRESSION MICRODISCECTOMY Right 05/22/2019   Procedure: LAMINOTOMY AND MICRODISCECTOMY RIGHT LUMBAR FOUR- LUMBAR FIVE;  Surgeon: Consuella Lose, MD;  Location: [place];  Service: Neurosurgery;  Laterality: Right;  LAMINOTOMY AND MICRODISCECTOMY RIGHT LUMBAR FOUR- LUMBAR FIVE   LUMBAR LAMINECTOMY/DECOMPRESSION MICRODISCECTOMY Right 02/01/2020   Procedure: REDO MICRODISCECTOMY RT LUMBAR FOUR-FIVE;  Surgeon: Consuella Lose, MD;  Location: [place];  Service: Neurosurgery;  Laterality: Right;  posterior   TONSILLECTOMY     TOTAL KNEE ARTHROPLASTY Left 05/06/2021   Procedure: TOTAL KNEE ARTHROPLASTY;  Surgeon: Paralee Cancel, MD;  Location[facility];  Service: Orthopedics;  Laterality: Left;    There were no vitals filed for this visit.   Subjective Assessment - 07/15/21 1410     Subjective Continues to work on his exercises    Pertinent History AODM; IVIG for guillain barre; arthritis; multiple lumbar and cervical surgeries/fusions; sleep apnea    Patient Stated Goals walking with cane independently    Currently in Pain? No/denies                [facility] PT Assessment - 07/15/21 0001       Assessment   Medical Diagnosis Lt TKA    Referring Provider (PT) Dr Paralee Cancel    Onset Date/Surgical Date 05/06/21  [place] Adult PT Treatment/Exercise - 07/15/21 1413       Ambulation/Gait   Ambulation Distance (Feet) 400 Feet    Assistive device None    Gait Pattern Antalgic;Decreased step length - right;Decreased stance time - left    Ambulation Surface Level;Indoor    Gait Comments cues for symmetrical step length      Knee/Hip Exercises: Stretches   Passive Hamstring Stretch Left;60 seconds;2 reps    Passive Hamstring Stretch Limitations supine and then in standing      Knee/Hip Exercises: Aerobic   Recumbent Bike L3 x 5 min      Knee/Hip Exercises: Standing   SLS 2x20 sec    Other Standing Knee Exercises  tandem stance 3x30 sec      Knee/Hip Exercises: Seated   Sit to Sand 20 reps   with 10# KB     Manual Therapy   Passive ROM PROM in supine into knee flexion and extension                       PT Short Term Goals - 06/17/21 1054       PT SHORT TERM GOAL #1   Title Independent in initial HEP    Status Achieved      PT SHORT TERM GOAL #2   Title Increase AAROMAROM Lt knee to -10 deg extension and 100 degrees flexion    Status Achieved      PT SHORT TERM GOAL #3   Title Independent in ambulation with walking for functional, community distances    Baseline using SPC    Status On-going               PT Long Term Goals - 05/08/21 1345       PT LONG TERM GOAL #1   Title AROM Lt knee flexion 105 degrees and extension 0 to (-)3 degrees    Baseline -    Time 12    Period Weeks    Status New    Target Date 07/31/21      PT LONG TERM GOAL #2   Title The patient will improve LE strength to 4+/5 to 5-/5 hip flexion, extension and abduction; knee flexion/extension    Baseline -    Time 12    Period Weeks    Status New    Target Date 07/31/21      PT LONG TERM GOAL #3   Title Patient to demonstrate safe independent gait with assistive device as indicated for community distances    Baseline -    Time 12    Period Weeks    Status New    Target Date 07/31/21      PT LONG TERM GOAL #4   Title Independent in HEP inclusind aquatic program as indicated    Baseline -    Time 12    Period Weeks    Status New    Target Date 07/31/21      PT LONG TERM GOAL #5   Title Improve functional limitation score to 40    Baseline -    Time 12    Period Weeks    Status New    Target Date 07/31/21                   Plan - 07/15/21 1440     Clinical Impression Statement Continued to work on knee ROM and strengthening. Increased time spent ambulating without a/d this session. Worked on  single leg stability and weight bearing.    Personal Factors and  Comorbidities Comorbidity 1;Comorbidity 2;Comorbidity 3+    Comorbidities Guillian Barre; AODM; HTN; multiple lumbar and cervical surgeries/fusions    Examination-Activity Limitations Bathing;Locomotion Level;Bed Mobility;Transfers;Bend;Carry;Dressing;Hygiene/Grooming;Lift;Sit;Sleep;Squat;Stairs;Stand;Toileting    Examination-Participation Restrictions Community Activity;Yard Work;Shop;Other    PT Treatment/Interventions ADLs/Self Care Home Management;Aquatic Therapy;Cryotherapy;Electrical Stimulation;Iontophoresis '4mg'$ /ml Dexamethasone;Moist Heat;Ultrasound;Gait training;Stair training;Functional mobility training;Therapeutic activities;Therapeutic exercise;Balance training;Neuromuscular re-education;Patient/family education;Manual techniques;Scar mobilization;Passive range of motion;Dry needling;Taping;Vasopneumatic Device    PT Next Visit Plan working towards d/c by 07/31/21. functional knee strength and endurance    PT Home Exercise Plan [email or web address]    Consulted and Agree with Plan of Care Patient             Patient will benefit from skilled therapeutic intervention in order to improve the following deficits and impairments:  Abnormal gait, Decreased coordination, Decreased range of motion, Difficulty walking, Decreased activity tolerance, Pain, Decreased balance, Impaired flexibility, Decreased mobility, Decreased strength, Increased edema, Postural dysfunction  Visit Diagnosis: Acute pain of left knee  Abnormal gait  Weakness generalized  Muscle weakness (generalized)  Other abnormalities of gait and mobility  Edema of knee     Problem List Patient Active Problem List   Diagnosis Date Noted   S/P total knee arthroplasty, left 05/06/2021   Lumbar herniated disc 02/01/2020   Lumbar radiculopathy 05/22/2019   Spastic gait 03/07/2019   Myalgia 11/01/2018   Chronic bilateral low back pain without sciatica 07/21/2018   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   CIDP (chronic  inflammatory demyelinating polyneuropathy) ([place]) 12/30/2017   Edema 03/01/2017   Hypokalemia    Slow transit constipation    Steroid-induced hyperglycemia    Urinary retention    Accidental drug overdose    Postoperative pain    Hyponatremia    Cervical myelopathy ([facility]CC) 02/01/2017   Shortness of breath at rest    Hypoglycemia    Spondylosis, cervical, with myelopathy    Neuropathic pain    Benign essential HTN    Labile blood glucose    Muscle spasm    GAD (generalized anxiety disorder)    Acute inflammatory demyelinating polyneuropathy ([place]) 01/11/2017   Anxiety state    Neurogenic bladder    Depression 12/30/2016   Leg weakness, bilateral 12/30/2016   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[place]) 12/30/2016   GBS (Guillain Barre syndrome) (HCC)    AIDP (acute inflammatory demyelinating polyneuropathy) ([place]) 11/27/2016   Weakness 11/20/2016   Weakness of both arms 10/30/2016   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   Obstructive sleep apnea 03/24/2016   Numbness 03/18/2016   Mild nonproliferative diabetic retinopathy of both eyes without macular edema associated with type 2 diabetes mellitus ([place]) 05/24/2015   Nuclear cataract of both eyes 05/24/2015   Diabetes mellitus without complication ([place]) 11/91/4782   Chronic prostatitis 03/01/2015   Eustachian tube dysfunction 02/12/2015   Acute maxillary sinusitis 02/12/2015   Wellness examination 08/22/2014   Alkaline phosphatase elevation 08/22/2014   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   Benign prostatic hyperplasia with urinary frequency 01/31/2014   Lesion of eyelid 09/26/2013   Screening for prostate cancer 04/24/2013   Diabetes ([place]) 06/16/2012   ED (erectile dysfunction) of organic origin 06/03/2012   Routine general medical examination at a health care facility 04/10/2012   BPH (benign prostatic hyperplasia) 02/03/2010   HEMOCCULT POSITIVE STOOL 05/25/2008   ELBOW  PAIN, RIGHT 07/04/2007   Dyslipidemia 02/16/2007   ANXIETY 02/16/2007   ERECTILE DYSFUNCTION 02/16/2007   Essential hypertension  02/16/2007   TESTICULAR MASS, LEFT 02/16/2007   Chronic pain of left knee 02/16/2007   ADENOIDECTOMY, HX OF 02/16/2007    [facility] April Gordy Levan, [place], DPT 07/15/2021, 2:46 PM  [facility] [place], [place], [address] Phone: [phone]   Fax:  [phone]  Name: KHARI MALLY MRN: [id number] Date of Birth: [birth year]

## 2021-07-17 ENCOUNTER — Ambulatory Visit: Payer: Medicare Other | Attending: Orthopedic Surgery | Admitting: Physical Therapy

## 2021-07-17 DIAGNOSIS — M6281 Muscle weakness (generalized): Secondary | ICD-10-CM | POA: Insufficient documentation

## 2021-07-17 DIAGNOSIS — M25562 Pain in left knee: Secondary | ICD-10-CM | POA: Insufficient documentation

## 2021-07-17 DIAGNOSIS — M5440 Lumbago with sciatica, unspecified side: Secondary | ICD-10-CM | POA: Insufficient documentation

## 2021-07-17 DIAGNOSIS — M25469 Effusion, unspecified knee: Secondary | ICD-10-CM | POA: Diagnosis present

## 2021-07-17 DIAGNOSIS — R531 Weakness: Secondary | ICD-10-CM | POA: Insufficient documentation

## 2021-07-17 DIAGNOSIS — R269 Unspecified abnormalities of gait and mobility: Secondary | ICD-10-CM | POA: Diagnosis present

## 2021-07-17 DIAGNOSIS — R2689 Other abnormalities of gait and mobility: Secondary | ICD-10-CM | POA: Insufficient documentation

## 2021-07-17 DIAGNOSIS — R2681 Unsteadiness on feet: Secondary | ICD-10-CM | POA: Insufficient documentation

## 2021-07-17 NOTE — Therapy (Signed)
[place], [place], [address] Phone: [phone]   Fax:  [phone]  Physical Therapy Treatment  Patient Details  Name: Kenneth Pace MRN: [id number] Date of Birth: 01-16-1951 Referring Provider (PT): Dr Paralee Cancel   Encounter Date: 07/17/2021   PT End of Session - 07/17/21 1018     Visit Number 19    Number of Visits 24    Date for PT Re-Evaluation 07/31/21    Authorization - Visit Number 19    Progress Note Due on Visit 20    PT Start Time 1884    PT Stop Time 1100    PT Time Calculation (min) 45 min    Activity Tolerance Patient tolerated treatment well    Behavior During Therapy [facility] for tasks assessed/performed             Past Medical History:  Diagnosis Date   ADENOIDECTOMY, HX OF 02/16/2007   Anxiety    pt. states he does not have anxiety.   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[place])    diagnosed 11/2016   DIABETES MELLITUS, TYPE I, UNCONTROLLED 12/29/2006   ERECTILE DYSFUNCTION 02/16/2007   Hearing loss    wears hearing aids   HYPERLIPIDEMIA 02/16/2007   HYPERTENSION 02/16/2007   Nerve pain    per patient "on lower back and both legs"   Sleep apnea    uses CPAP   TESTICULAR MASS, LEFT 02/16/2007   Therapeutic opioid-induced constipation (OIC)     Past Surgical History:  Procedure Laterality Date   ANTERIOR CERVICAL DECOMP/DISCECTOMY FUSION N/A 02/01/2017   Procedure: ANTERIOR CERVICAL DECOMPRESSION/DISCECTOMY FUSION CERVICAL THREE-FOUR , CERVICAL FOUR-FIVE  CERVICAL FIVE-SIX;  Surgeon: Consuella Lose, MD;  Location: [facility];  Service: Neurosurgery;  Laterality: N/A;   APPENDECTOMY     BACK SURGERY     COLONOSCOPY  02/05/2010   [facility]   EYE SURGERY Right    Dr. Katy Fitch   KNEE SURGERY     2 Lknee arthroscopy, 3 R knee arthroscpoy   KNEE SURGERY Right 11/12/2016   LUMBAR FUSION   03/26/2020   LUMBAR LAMINECTOMY/DECOMPRESSION MICRODISCECTOMY Right 05/22/2019   Procedure: LAMINOTOMY AND MICRODISCECTOMY RIGHT LUMBAR FOUR- LUMBAR FIVE;  Surgeon: Consuella Lose, MD;  Location: [facility];  Service: Neurosurgery;  Laterality: Right;  LAMINOTOMY AND MICRODISCECTOMY RIGHT LUMBAR FOUR- LUMBAR FIVE   LUMBAR LAMINECTOMY/DECOMPRESSION MICRODISCECTOMY Right 02/01/2020   Procedure: REDO MICRODISCECTOMY RT LUMBAR FOUR-FIVE;  Surgeon: Consuella Lose, MD;  Location: [facility];  Service: Neurosurgery;  Laterality: Right;  posterior   TONSILLECTOMY     TOTAL KNEE ARTHROPLASTY Left 05/06/2021   Procedure: TOTAL KNEE ARTHROPLASTY;  Surgeon: Paralee Cancel, MD;  [facility];  Service: Orthopedics;  Laterality: Left;    There were no vitals filed for this visit.   Subjective Assessment - 07/17/21 1019     Subjective Pt reports he was very sore after last session. Continues to be sore today.    Pertinent History AODM; IVIG for guillain barre; arthritis; multiple lumbar and cervical surgeries/fusions; sleep apnea    Patient Stated Goals walking with cane independently    Currently in Pain? Yes    Pain Score 4     Pain Location Knee    Pain Orientation Left                               [facility] Adult  PT Treatment/Exercise - 07/17/21 0001       Ambulation/Gait   Gait Comments cariocas 4x8'; backwards walking 4x8'      Knee/Hip Exercises: Stretches   Passive Hamstring Stretch Left;2 reps;30 seconds    Passive Hamstring Stretch Limitations supine and then in standing    Quad Stretch Left;2 reps;30 seconds    Quad Stretch Limitations supine with strap and then prone with strap x 1 min      Knee/Hip Exercises: Aerobic   Recumbent Bike L1 x 8 min      Knee/Hip Exercises: Standing   SLS 2x20 sec    Other Standing Knee Exercises tandem stance 2x30 sec    Other Standing Knee Exercises tandem walking 3x8'      Knee/Hip Exercises: Supine   Quad Sets Left;10 reps     Quad Sets Limitations with SLR using strap      Knee/Hip Exercises: Prone   Hamstring Curl 10 reps      Manual Therapy   Passive ROM PROM in supine into knee flexion and extension                       PT Short Term Goals - 06/17/21 1054       PT SHORT TERM GOAL #1   Title Independent in initial HEP    Status Achieved      PT SHORT TERM GOAL #2   Title Increase AAROMAROM Lt knee to -10 deg extension and 100 degrees flexion    Status Achieved      PT SHORT TERM GOAL #3   Title Independent in ambulation with walking for functional, community distances    Baseline using SPC    Status On-going               PT Long Term Goals - 05/08/21 1345       PT LONG TERM GOAL #1   Title AROM Lt knee flexion 105 degrees and extension 0 to (-)3 degrees    Baseline -    Time 12    Period Weeks    Status New    Target Date 07/31/21      PT LONG TERM GOAL #2   Title The patient will improve LE strength to 4+/5 to 5-/5 hip flexion, extension and abduction; knee flexion/extension    Baseline -    Time 12    Period Weeks    Status New    Target Date 07/31/21      PT LONG TERM GOAL #3   Title Patient to demonstrate safe independent gait with assistive device as indicated for community distances    Baseline -    Time 12    Period Weeks    Status New    Target Date 07/31/21      PT LONG TERM GOAL #4   Title Independent in HEP inclusind aquatic program as indicated    Baseline -    Time 12    Period Weeks    Status New    Target Date 07/31/21      PT LONG TERM GOAL #5   Title Improve functional limitation score to 40    Baseline -    Time 12    Period Weeks    Status New    Target Date 07/31/21                   Plan - 07/17/21 1059     Clinical Impression Statement  Pt with increased soreness. Continued to work on knee ROM and end range strength. Worked on dynamic gait and stability this session.    Personal Factors and Comorbidities  Comorbidity 1;Comorbidity 2;Comorbidity 3+    Comorbidities Guillian Barre; AODM; HTN; multiple lumbar and cervical surgeries/fusions    Examination-Activity Limitations Bathing;Locomotion Level;Bed Mobility;Transfers;Bend;Carry;Dressing;Hygiene/Grooming;Lift;Sit;Sleep;Squat;Stairs;Stand;Toileting    Examination-Participation Restrictions Community Activity;Yard Work;Shop;Other    PT Treatment/Interventions ADLs/Self Care Home Management;Aquatic Therapy;Cryotherapy;Electrical Stimulation;Iontophoresis '4mg'$ /ml Dexamethasone;Moist Heat;Ultrasound;Gait training;Stair training;Functional mobility training;Therapeutic activities;Therapeutic exercise;Balance training;Neuromuscular re-education;Patient/family education;Manual techniques;Scar mobilization;Passive range of motion;Dry needling;Taping;Vasopneumatic Device    PT Next Visit Plan working towards d/c by 07/31/21. functional knee strength and endurance    PT Home Exercise Plan [id number]    Consulted and Agree with Plan of Care Patient             Patient will benefit from skilled therapeutic intervention in order to improve the following deficits and impairments:  Abnormal gait, Decreased coordination, Decreased range of motion, Difficulty walking, Decreased activity tolerance, Pain, Decreased balance, Impaired flexibility, Decreased mobility, Decreased strength, Increased edema, Postural dysfunction  Visit Diagnosis: Acute pain of left knee  Abnormal gait  Weakness generalized  Muscle weakness (generalized)  Other abnormalities of gait and mobility     Problem List Patient Active Problem List   Diagnosis Date Noted   S/P total knee arthroplasty, left 05/06/2021   Lumbar herniated disc 02/01/2020   Lumbar radiculopathy 05/22/2019   Spastic gait 03/07/2019   Myalgia 11/01/2018   Chronic bilateral low back pain without sciatica 07/21/2018   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   CIDP (chronic inflammatory demyelinating  polyneuropathy) ([place]) 12/30/2017   Edema 03/01/2017   Hypokalemia    Slow transit constipation    Steroid-induced hyperglycemia    Urinary retention    Accidental drug overdose    Postoperative pain    Hyponatremia    Cervical myelopathy ([facility]) 02/01/2017   Shortness of breath at rest    Hypoglycemia    Spondylosis, cervical, with myelopathy    Neuropathic pain    Benign essential HTN    Labile blood glucose    Muscle spasm    GAD (generalized anxiety disorder)    Acute inflammatory demyelinating polyneuropathy ([place]) 01/11/2017   Anxiety state    Neurogenic bladder    Depression 12/30/2016   Leg weakness, bilateral 12/30/2016   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[place]) 12/30/2016   GBS (Guillain Barre syndrome) ([facility])    AIDP (acute inflammatory demyelinating polyneuropathy) ([place]) 11/27/2016   Weakness 11/20/2016   Weakness of both arms 10/30/2016   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   Obstructive sleep apnea 03/24/2016   Numbness 03/18/2016   Mild nonproliferative diabetic retinopathy of both eyes without macular edema associated with type 2 diabetes mellitus ([place]) 05/24/2015   Nuclear cataract of both eyes 05/24/2015   Diabetes mellitus without complication ([place]) 28/78/6767   Chronic prostatitis 03/01/2015   Eustachian tube dysfunction 02/12/2015   Acute maxillary sinusitis 02/12/2015   Wellness examination 08/22/2014   Alkaline phosphatase elevation 08/22/2014   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   Benign prostatic hyperplasia with urinary frequency 01/31/2014   Lesion of eyelid 09/26/2013   Screening for prostate cancer 04/24/2013   Diabetes ([place]) 06/16/2012   ED (erectile dysfunction) of organic origin 06/03/2012   Routine general medical examination at a health care facility 04/10/2012   BPH (benign prostatic hyperplasia) 02/03/2010   HEMOCCULT POSITIVE STOOL 05/25/2008   ELBOW PAIN, RIGHT 07/04/2007    Dyslipidemia 02/16/2007  ANXIETY 02/16/2007   ERECTILE DYSFUNCTION 02/16/2007   Essential hypertension 02/16/2007   TESTICULAR MASS, LEFT 02/16/2007   Chronic pain of left knee 02/16/2007   ADENOIDECTOMY, HX OF 02/16/2007    [facility] April Ma L Braedin Millhouse, PT, DPT 07/17/2021, 11:00 AM  [facility] [place], [place], [address] Phone: [phone]   Fax:  [phone]  Name: CAIN FITZHENRY MRN: [id number] Date of Birth: 1950-02-18

## 2021-07-21 ENCOUNTER — Ambulatory Visit (HOSPITAL_COMMUNITY): Payer: Medicare Other

## 2021-07-22 ENCOUNTER — Ambulatory Visit: Payer: Medicare Other | Admitting: Physical Therapy

## 2021-07-22 DIAGNOSIS — M25562 Pain in left knee: Secondary | ICD-10-CM | POA: Diagnosis not present

## 2021-07-22 DIAGNOSIS — R531 Weakness: Secondary | ICD-10-CM

## 2021-07-22 DIAGNOSIS — R269 Unspecified abnormalities of gait and mobility: Secondary | ICD-10-CM

## 2021-07-22 NOTE — Therapy (Signed)
[place], [place], [address] Phone: [phone]   Fax:  [phone]  Physical Therapy Treatment and 10th visit note  Patient Details  Name: Kenneth Pace MRN: [id number] Date of Birth: 06-16-1950 Referring Provider (PT): Dr Paralee Cancel   Encounter Date: 07/22/2021 Rationale for Evaluation and Treatment Rehabilitation Dates of service: 05/08/21-07/22/21  PT End of Session - 07/22/21 1527     Visit Number 20    Number of Visits 24    Date for PT Re-Evaluation 07/31/21    Authorization - Visit Number 20    Progress Note Due on Visit 30    PT Start Time 7939    PT Stop Time 1525    PT Time Calculation (min) 40 min    Activity Tolerance Patient tolerated treatment well    Behavior During Therapy [facility] for tasks assessed/performed             Past Medical History:  Diagnosis Date   ADENOIDECTOMY, HX OF 02/16/2007   Anxiety    pt. states he does not have anxiety.   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[place])    diagnosed 11/2016   DIABETES MELLITUS, TYPE I, UNCONTROLLED 12/29/2006   ERECTILE DYSFUNCTION 02/16/2007   Hearing loss    wears hearing aids   HYPERLIPIDEMIA 02/16/2007   HYPERTENSION 02/16/2007   Nerve pain    per patient "on lower back and both legs"   Sleep apnea    uses CPAP   TESTICULAR MASS, LEFT 02/16/2007   Therapeutic opioid-induced constipation (OIC)     Past Surgical History:  Procedure Laterality Date   ANTERIOR CERVICAL DECOMP/DISCECTOMY FUSION N/A 02/01/2017   Procedure: ANTERIOR CERVICAL DECOMPRESSION/DISCECTOMY FUSION CERVICAL THREE-FOUR , CERVICAL FOUR-FIVE  CERVICAL FIVE-SIX;  Surgeon: Consuella Lose, MD;  Location: [facility];  Service: Neurosurgery;  Laterality: N/A;   APPENDECTOMY     BACK SURGERY     COLONOSCOPY  02/05/2010   [facility]   EYE SURGERY Right    Dr. Katy Fitch   KNEE  SURGERY     2 Lknee arthroscopy, 3 R knee arthroscpoy   KNEE SURGERY Right 11/12/2016   LUMBAR FUSION  03/26/2020   LUMBAR LAMINECTOMY/DECOMPRESSION MICRODISCECTOMY Right 05/22/2019   Procedure: LAMINOTOMY AND MICRODISCECTOMY RIGHT LUMBAR FOUR- LUMBAR FIVE;  Surgeon: Consuella Lose, MD;  Location: [facility];  Service: Neurosurgery;  Laterality: Right;  LAMINOTOMY AND MICRODISCECTOMY RIGHT LUMBAR FOUR- LUMBAR FIVE   LUMBAR LAMINECTOMY/DECOMPRESSION MICRODISCECTOMY Right 02/01/2020   Procedure: REDO MICRODISCECTOMY RT LUMBAR FOUR-FIVE;  Surgeon: Consuella Lose, MD;  Location: [facility];  Service: Neurosurgery;  Laterality: Right;  posterior   TONSILLECTOMY     TOTAL KNEE ARTHROPLASTY Left 05/06/2021   Procedure: TOTAL KNEE ARTHROPLASTY;  Surgeon: Paralee Cancel, MD;  [facility];  Service: Orthopedics;  Laterality: Left;    There were no vitals filed for this visit.   Subjective Assessment - 07/22/21 1450     Subjective Pt states he is feeling pretty good. Walked in without his cane today    Patient Stated Goals walking with cane independently    Currently in Pain? Yes    Pain Score 1     Pain Location Knee    Pain Orientation Left                [facility] PT Assessment - 07/22/21 0001       AROM   Left Knee Extension -2  [place] Adult PT Treatment/Exercise - 07/22/21 0001       Knee/Hip Exercises: Stretches   Passive Hamstring Stretch 3 reps;30 seconds    Passive Hamstring Stretch Limitations seated    Quad Stretch Left;2 reps;30 seconds    Quad Stretch Limitations prone      Knee/Hip Exercises: Aerobic   Recumbent Bike L3 x 6 min      Knee/Hip Exercises: Standing   SLS 2x20 sec    Other Standing Knee Exercises tandem stance 2 x 30 sec bilat    Other Standing Knee Exercises tandem walking fwd/bkwd 3 x 16', carioke step 3 x 16'      Knee/Hip Exercises: Seated   Sit to Sand 20 reps   10# KB     Knee/Hip Exercises: Supine    Straight Leg Raises 10 reps    Straight Leg Raises Limitations with quad set      Knee/Hip Exercises: Sidelying   Hip ABduction 2 sets;10 reps      Knee/Hip Exercises: Prone   Hamstring Curl 10 reps                       PT Short Term Goals - 06/17/21 1054       PT SHORT TERM GOAL #1   Title Independent in initial HEP    Status Achieved      PT SHORT TERM GOAL #2   Title Increase AAROMAROM Lt knee to -10 deg extension and 100 degrees flexion    Status Achieved      PT SHORT TERM GOAL #3   Title Independent in ambulation with walking for functional, community distances    Baseline using SPC    Status On-going               PT Long Term Goals - 05/08/21 1345       PT LONG TERM GOAL #1   Title AROM Lt knee flexion 105 degrees and extension 0 to (-)3 degrees    Baseline -    Time 12    Period Weeks    Status New    Target Date 07/31/21      PT LONG TERM GOAL #2   Title The patient will improve LE strength to 4+/5 to 5-/5 hip flexion, extension and abduction; knee flexion/extension    Baseline -    Time 12    Period Weeks    Status New    Target Date 07/31/21      PT LONG TERM GOAL #3   Title Patient to demonstrate safe independent gait with assistive device as indicated for community distances    Baseline -    Time 12    Period Weeks    Status New    Target Date 07/31/21      PT LONG TERM GOAL #4   Title Independent in HEP inclusind aquatic program as indicated    Baseline -    Time 12    Period Weeks    Status New    Target Date 07/31/21      PT LONG TERM GOAL #5   Title Improve functional limitation score to 40    Baseline -    Time 12    Period Weeks    Status New    Target Date 07/31/21                   Plan - 07/22/21 1528     Clinical Impression Statement Pt  is progressing well with knee strength and ROM. Worked on dynamic gait and balance. He will be ready for d/c next visit    PT Next Visit Plan d/c after  visit 6/8    PT [place] and Agree with Plan of Care Patient             Patient will benefit from skilled therapeutic intervention in order to improve the following deficits and impairments:     Visit Diagnosis: Acute pain of left knee  Abnormal gait  Weakness generalized     Problem List Patient Active Problem List   Diagnosis Date Noted   S/P total knee arthroplasty, left 05/06/2021   Lumbar herniated disc 02/01/2020   Lumbar radiculopathy 05/22/2019   Spastic gait 03/07/2019   Myalgia 11/01/2018   Chronic bilateral low back pain without sciatica 07/21/2018   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   CIDP (chronic inflammatory demyelinating polyneuropathy) ([place]) 12/30/2017   Edema 03/01/2017   Hypokalemia    Slow transit constipation    Steroid-induced hyperglycemia    Urinary retention    Accidental drug overdose    Postoperative pain    Hyponatremia    Cervical myelopathy ([facility]) 02/01/2017   Shortness of breath at rest    Hypoglycemia    Spondylosis, cervical, with myelopathy    Neuropathic pain    Benign essential HTN    Labile blood glucose    Muscle spasm    GAD (generalized anxiety disorder)    Acute inflammatory demyelinating polyneuropathy ([place]) 01/11/2017   Anxiety state    Neurogenic bladder    Depression 12/30/2016   Leg weakness, bilateral 12/30/2016   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[place]) 12/30/2016   GBS (Guillain Barre syndrome) (H[facility])    AIDP (acute inflammatory demyelinating polyneuropathy) ([place]) 11/27/2016   Weakness 11/20/2016   Weakness of both arms 10/30/2016   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   Obstructive sleep apnea 03/24/2016   Numbness 03/18/2016   Mild nonproliferative diabetic retinopathy of both eyes without macular edema associated with type 2 diabetes mellitus ([place]) 05/24/2015   Nuclear cataract of both eyes 05/24/2015   Diabetes mellitus without  complication ([place]) 50/38/8828   Chronic prostatitis 03/01/2015   Eustachian tube dysfunction 02/12/2015   Acute maxillary sinusitis 02/12/2015   Wellness examination 08/22/2014   Alkaline phosphatase elevation 08/22/2014   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   Benign prostatic hyperplasia with urinary frequency 01/31/2014   Lesion of eyelid 09/26/2013   Screening for prostate cancer 04/24/2013   Diabetes ([place]) 06/16/2012   ED (erectile dysfunction) of organic origin 06/03/2012   Routine general medical examination at a health care facility 04/10/2012   BPH (benign prostatic hyperplasia) 02/03/2010   HEMOCCULT POSITIVE STOOL 05/25/2008   ELBOW PAIN, RIGHT 07/04/2007   Dyslipidemia 02/16/2007   ANXIETY 02/16/2007   ERECTILE DYSFUNCTION 02/16/2007   Essential hypertension 02/16/2007   TESTICULAR MASS, LEFT 02/16/2007   Chronic pain of left knee 02/16/2007   ADENOIDECTOMY, HX OF 02/16/2007    Iyanah Demont, PT 07/22/2021, 3:29 PM  [facility] [place] [address] [place], [place], [address] Phone: [phone]   Fax:  [phone]  Name: Kenneth Pace MRN: [id number] Date of Birth: [birth year]

## 2021-07-24 ENCOUNTER — Ambulatory Visit: Payer: Medicare Other | Admitting: Physical Therapy

## 2021-07-24 DIAGNOSIS — M25562 Pain in left knee: Secondary | ICD-10-CM | POA: Diagnosis not present

## 2021-07-24 DIAGNOSIS — R269 Unspecified abnormalities of gait and mobility: Secondary | ICD-10-CM

## 2021-07-24 DIAGNOSIS — R531 Weakness: Secondary | ICD-10-CM

## 2021-07-24 DIAGNOSIS — M6281 Muscle weakness (generalized): Secondary | ICD-10-CM

## 2021-07-24 DIAGNOSIS — M25469 Effusion, unspecified knee: Secondary | ICD-10-CM

## 2021-07-24 DIAGNOSIS — M5440 Lumbago with sciatica, unspecified side: Secondary | ICD-10-CM

## 2021-07-24 DIAGNOSIS — R2681 Unsteadiness on feet: Secondary | ICD-10-CM

## 2021-07-24 DIAGNOSIS — R2689 Other abnormalities of gait and mobility: Secondary | ICD-10-CM

## 2021-07-24 NOTE — Therapy (Signed)
[facility] [place], [place], [address] Phone: [phone]   Fax:  [phone]  Physical Therapy Treatment and Discharge  Patient Details  Name: Kenneth Pace MRN: [id number] Date of Birth: 21-Oct-1950 Referring Provider (PT): Dr Paralee Cancel  Rationale for Evaluation and Treatment Rehabilitation PHYSICAL THERAPY DISCHARGE SUMMARY  Visits from Start of Care: 21  Current functional level related to goals / functional outcomes: See below   Remaining deficits: See below   Education / Equipment: See below   Patient agrees to discharge. Patient goals were met. Patient is being discharged due to meeting the stated rehab goals.   Encounter Date: 07/24/2021   PT End of Session - 07/24/21 1452     Visit Number 21    Number of Visits 24    Date for PT Re-Evaluation 07/31/21    Authorization - Visit Number 21    Progress Note Due on Visit 30    PT Start Time 1450    PT Stop Time 1525    PT Time Calculation (min) 35 min    Equipment Utilized During Treatment Gait belt    Activity Tolerance Patient tolerated treatment well    Behavior During Therapy WFL for tasks assessed/performed             Past Medical History:  Diagnosis Date   ADENOIDECTOMY, HX OF 02/16/2007   Anxiety    pt. states he does not have anxiety.   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[place])    diagnosed 11/2016   DIABETES MELLITUS, TYPE I, UNCONTROLLED 12/29/2006   ERECTILE DYSFUNCTION 02/16/2007   Hearing loss    wears hearing aids   HYPERLIPIDEMIA 02/16/2007   HYPERTENSION 02/16/2007   Nerve pain    per patient "on lower back and both legs"   Sleep apnea    uses CPAP   TESTICULAR MASS, LEFT 02/16/2007   Therapeutic opioid-induced constipation (OIC)     Past Surgical History:  Procedure Laterality Date   ANTERIOR CERVICAL DECOMP/DISCECTOMY  FUSION N/A 02/01/2017   Procedure: ANTERIOR CERVICAL DECOMPRESSION/DISCECTOMY FUSION CERVICAL THREE-FOUR , CERVICAL FOUR-FIVE  CERVICAL FIVE-SIX;  Surgeon: Consuella Lose, MD;  Location: [facility];  Service: Neurosurgery;  Laterality: N/A;   APPENDECTOMY     BACK SURGERY     COLONOSCOPY  02/05/2010   [facility]   EYE SURGERY Right    Dr. Katy Fitch   KNEE SURGERY     2 Lknee arthroscopy, 3 R knee arthroscpoy   KNEE SURGERY Right 11/12/2016   LUMBAR FUSION  03/26/2020   LUMBAR LAMINECTOMY/DECOMPRESSION MICRODISCECTOMY Right 05/22/2019   Procedure: LAMINOTOMY AND MICRODISCECTOMY RIGHT LUMBAR FOUR- LUMBAR FIVE;  Surgeon: Consuella Lose, MD;  Location: [facility];  Service: Neurosurgery;  Laterality: Right;  LAMINOTOMY AND MICRODISCECTOMY RIGHT LUMBAR FOUR- LUMBAR FIVE   LUMBAR LAMINECTOMY/DECOMPRESSION MICRODISCECTOMY Right 02/01/2020   Procedure: REDO MICRODISCECTOMY RT LUMBAR FOUR-FIVE;  Surgeon: Consuella Lose, MD;  Location: [facility];  Service: Neurosurgery;  Laterality: Right;  posterior   TONSILLECTOMY     TOTAL KNEE ARTHROPLASTY Left 05/06/2021   Procedure: TOTAL KNEE ARTHROPLASTY;  Surgeon: Paralee Cancel, MD;  [facility];  Service: Orthopedics;  Laterality: Left;    There were no vitals filed for this visit.   Subjective Assessment - 07/24/21 1453     Subjective Continues to feel good.    Pertinent History AODM; IVIG for guillain barre; arthritis; multiple lumbar and cervical surgeries/fusions; sleep apnea  Patient Stated Goals walking with cane independently    Currently in Pain? Yes    Pain Score 1     Pain Location Knee    Pain Orientation Left    Pain Descriptors / Indicators Tightness    Pain Type Surgical pain;Chronic pain                [facility] PT Assessment - 07/24/21 0001       Assessment   Medical Diagnosis Lt TKA    Referring Provider (PT) Dr Paralee Cancel    Onset Date/Surgical Date 05/06/21    Hand Dominance Right    Next MD Visit 05/21/21      AROM    Left Knee Extension -3    Left Knee Flexion 125      PROM   Left Knee Extension -3    Left Knee Flexion 125      Strength   Right Hip Flexion 5/5    Right Hip Extension 4/5    Right Hip ABduction 4+/5    Left Hip Flexion 5/5    Left Hip Extension 4/5    Left Hip ABduction 4/5    Right Knee Flexion 4+/5    Right Knee Extension 4+/5    Left Knee Flexion 4+/5    Left Knee Extension 4+/5      Ambulation/Gait   Ambulation Distance (Feet) 1000 Feet   half way around building   Assistive device None    Gait Pattern Step-through pattern;Left flexed knee in stance   circumduction ~25% of the time but able to correct with v/cs   Ambulation Surface Level;Unlevel;Indoor;Outdoor;Paved    Stairs Yes    Stairs Assistance 5: Supervision    Stair Management Technique One rail Left   ascending needed rail; descending no rail needed   Number of Stairs 15    Height of Stairs 6    Gait Comments cariocas 2x8'; backwards walking 4x8'                           [facility] Adult PT Treatment/Exercise - 07/24/21 0001       Knee/Hip Exercises: Aerobic   Recumbent Bike L3 x 5 min      Manual Therapy   Passive ROM PROM in supine into knee flexion and extension                       PT Short Term Goals - 06/17/21 1054       PT SHORT TERM GOAL #1   Title Independent in initial HEP    Status Achieved      PT SHORT TERM GOAL #2   Title Increase AAROMAROM Lt knee to -10 deg extension and 100 degrees flexion    Status Achieved      PT SHORT TERM GOAL #3   Title Independent in ambulation with walking for functional, community distances    Baseline using SPC    Status On-going               PT Long Term Goals - 07/24/21 1521       PT LONG TERM GOAL #1   Title AROM Lt knee flexion 105 degrees and extension 0 to (-)3 degrees    Baseline -    Time 12    Period Weeks    Status Achieved    Target Date 07/31/21      PT LONG TERM GOAL #2  Title The patient  will improve LE strength to 4+/5 to 5-/5 hip flexion, extension and abduction; knee flexion/extension    Baseline -    Time 12    Period Weeks    Status Achieved    Target Date 07/31/21      PT LONG TERM GOAL #3   Title Patient to demonstrate safe independent gait with assistive device as indicated for community distances    Baseline -    Time 12    Period Weeks    Status Achieved    Target Date 07/31/21      PT LONG TERM GOAL #4   Title Independent in HEP inclusind aquatic program as indicated    Baseline -    Time 12    Period Weeks    Status Achieved    Target Date 07/31/21      PT LONG TERM GOAL #5   Title Improve functional limitation score to 40    Baseline -    Time 12    Period Weeks    Status Achieved    Target Date 07/31/21                   Plan - 07/24/21 1527     Clinical Impression Statement Marden Noble has met all of his LTGs and feels ready for PT d/c. He has a plan in place for further community wellness and strengthening. Discussed HEP and inclusion of more walking to improve his endurance as he notes that at the end of the day his L LE has a harder time going up/down his stairs at home.    Personal Factors and Comorbidities Comorbidity 1;Comorbidity 2;Comorbidity 3+    Comorbidities Guillian Barre; AODM; HTN; multiple lumbar and cervical surgeries/fusions    Examination-Activity Limitations Bathing;Locomotion Level;Bed Mobility;Transfers;Bend;Carry;Dressing;Hygiene/Grooming;Lift;Sit;Sleep;Squat;Stairs;Stand;Toileting    Examination-Participation Restrictions Community Activity;Yard Work;Shop;Other    PT Treatment/Interventions ADLs/Self Care Home Management;Aquatic Therapy;Cryotherapy;Electrical Stimulation;Iontophoresis 78m/ml Dexamethasone;Moist Heat;Ultrasound;Gait training;Stair training;Functional mobility training;Therapeutic activities;Therapeutic exercise;Balance training;Neuromuscular re-education;Patient/family education;Manual techniques;Scar  mobilization;Passive range of motion;Dry needling;Taping;Vasopneumatic Device    PT Next Visit Plan d/c after visit 6/8    PT Home [facility]   Consulted and Agree with Plan of Care Patient             Patient will benefit from skilled therapeutic intervention in order to improve the following deficits and impairments:  Abnormal gait, Decreased coordination, Decreased range of motion, Difficulty walking, Decreased activity tolerance, Pain, Decreased balance, Impaired flexibility, Decreased mobility, Decreased strength, Increased edema, Postural dysfunction  Visit Diagnosis: Acute pain of left knee  Abnormal gait  Weakness generalized  Muscle weakness (generalized)  Other abnormalities of gait and mobility  Edema of knee  Acute low back pain with sciatica, sciatica laterality unspecified, unspecified back pain laterality  Unsteadiness on feet     Problem List Patient Active Problem List   Diagnosis Date Noted   S/P total knee arthroplasty, left 05/06/2021   Lumbar herniated disc 02/01/2020   Lumbar radiculopathy 05/22/2019   Spastic gait 03/07/2019   Myalgia 11/01/2018   Chronic bilateral low back pain without sciatica 07/21/2018   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   CIDP (chronic inflammatory demyelinating polyneuropathy) ([place] 12/30/2017   Edema 03/01/2017   Hypokalemia    Slow transit constipation    Steroid-induced hyperglycemia    Urinary retention    Accidental drug overdose    Postoperative pain    Hyponatremia    Cervical myelopathy ([facility]) 02/01/2017   Shortness of breath at  rest    Hypoglycemia    Spondylosis, cervical, with myelopathy    Neuropathic pain    Benign essential HTN    Labile blood glucose    Muscle spasm    GAD (generalized anxiety disorder)    Acute inflammatory demyelinating polyneuropathy ([place]) 01/11/2017   Anxiety state    Neurogenic bladder    Depression 12/30/2016   Leg weakness, bilateral 12/30/2016   Chronic  inflammatory demyelinating polyradiculoneuropathy ([place]) 12/30/2016   GBS (Guillain Barre syndrome) ([place])    AIDP (acute inflammatory demyelinating polyneuropathy) ([place]) 11/27/2016   Weakness 11/20/2016   Weakness of both arms 10/30/2016   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   Obstructive sleep apnea 03/24/2016   Numbness 03/18/2016   Mild nonproliferative diabetic retinopathy of both eyes without macular edema associated with type 2 diabetes mellitus ([place]) 05/24/2015   Nuclear cataract of both eyes 05/24/2015   Diabetes mellitus without complication ([place]) 27/61/4709   Chronic prostatitis 03/01/2015   Eustachian tube dysfunction 02/12/2015   Acute maxillary sinusitis 02/12/2015   Wellness examination 08/22/2014   Alkaline phosphatase elevation 08/22/2014   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   Benign prostatic hyperplasia with urinary frequency 01/31/2014   Lesion of eyelid 09/26/2013   Screening for prostate cancer 04/24/2013   Diabetes ([place]) 06/16/2012   ED (erectile dysfunction) of organic origin 06/03/2012   Routine general medical examination at a health care facility 04/10/2012   BPH (benign prostatic hyperplasia) 02/03/2010   HEMOCCULT POSITIVE STOOL 05/25/2008   ELBOW PAIN, RIGHT 07/04/2007   Dyslipidemia 02/16/2007   ANXIETY 02/16/2007   ERECTILE DYSFUNCTION 02/16/2007   Essential hypertension 02/16/2007   TESTICULAR MASS, LEFT 02/16/2007   Chronic pain of left knee 02/16/2007   ADENOIDECTOMY, HX OF 02/16/2007    [facility] April Gordy Levan, PT, DPT 07/24/2021, 3:29 PM  [facility] [address] [place], [place], [address] Phone: [phone]   Fax:  [phone]  Name: WARRICK LLERA MRN: [id number] Date of Birth: March 29, 1950

## 2021-07-25 ENCOUNTER — Ambulatory Visit (HOSPITAL_COMMUNITY)
Admission: RE | Admit: 2021-07-25 | Discharge: 2021-07-25 | Disposition: A | Discharge status: Home / Self Care | Payer: Medicare Other | Source: Ambulatory Visit | Attending: Neurology | Admitting: Neurology

## 2021-07-25 DIAGNOSIS — R269 Unspecified abnormalities of gait and mobility: Secondary | ICD-10-CM | POA: Insufficient documentation

## 2021-07-25 DIAGNOSIS — J449 Chronic obstructive pulmonary disease, unspecified: Secondary | ICD-10-CM | POA: Insufficient documentation

## 2021-07-25 DIAGNOSIS — G6181 Chronic inflammatory demyelinating polyneuritis: Secondary | ICD-10-CM | POA: Diagnosis not present

## 2021-07-25 DIAGNOSIS — M25562 Pain in left knee: Secondary | ICD-10-CM | POA: Insufficient documentation

## 2021-07-25 DIAGNOSIS — M6281 Muscle weakness (generalized): Secondary | ICD-10-CM | POA: Insufficient documentation

## 2021-07-25 DIAGNOSIS — Z79899 Other long term (current) drug therapy: Secondary | ICD-10-CM | POA: Insufficient documentation

## 2021-07-25 DIAGNOSIS — R2681 Unsteadiness on feet: Secondary | ICD-10-CM | POA: Insufficient documentation

## 2021-07-25 DIAGNOSIS — M5440 Lumbago with sciatica, unspecified side: Secondary | ICD-10-CM | POA: Insufficient documentation

## 2021-07-25 MED ORDER — IMMUNE GLOBULIN (HUMAN) 10 GM/100ML IV SOLN
1.0000 g/kg | INTRAVENOUS | Status: DC
Start: 2021-07-25 — End: 2021-07-26
  Administered 2021-07-25: 100 g via INTRAVENOUS
  Filled 2021-07-25: qty 1000

## 2021-08-01 ENCOUNTER — Encounter (HOSPITAL_COMMUNITY): Payer: Medicare Other

## 2021-08-27 ENCOUNTER — Other Ambulatory Visit: Payer: Self-pay | Admitting: Registered Nurse

## 2021-09-03 LAB — HM DIABETES EYE EXAM

## 2021-09-05 ENCOUNTER — Ambulatory Visit (HOSPITAL_COMMUNITY)
Admission: RE | Admit: 2021-09-05 | Discharge: 2021-09-05 | Disposition: A | Discharge status: Home / Self Care | Payer: Medicare Other | Source: Ambulatory Visit | Attending: Neurology | Admitting: Neurology

## 2021-09-05 DIAGNOSIS — G6181 Chronic inflammatory demyelinating polyneuritis: Secondary | ICD-10-CM | POA: Diagnosis present

## 2021-09-05 MED ORDER — IMMUNE GLOBULIN (HUMAN) 10 GM/100ML IV SOLN
1.0000 g/kg | INTRAVENOUS | Status: DC
Start: 2021-09-05 — End: 2021-09-06
  Administered 2021-09-05: 100 g via INTRAVENOUS
  Filled 2021-09-05: qty 1000

## 2021-09-19 ENCOUNTER — Telehealth: Payer: Self-pay

## 2021-09-19 NOTE — Telephone Encounter (Signed)
Patient's wife called says [employer] needs office notes. Gave me fax number [phone] and I faxed over.

## 2021-09-24 NOTE — Telephone Encounter (Signed)
[employer] called stating they need an addendum to the office notes. Need to know how many times testing per day and duration.

## 2021-09-25 NOTE — Telephone Encounter (Signed)
noted 

## 2021-10-16 ENCOUNTER — Other Ambulatory Visit: Payer: Self-pay | Admitting: Endocrinology

## 2021-10-16 ENCOUNTER — Encounter: Payer: Self-pay | Admitting: *Deleted

## 2021-10-16 ENCOUNTER — Ambulatory Visit: Payer: Self-pay | Admitting: *Deleted

## 2021-10-16 DIAGNOSIS — E1042 Type 1 diabetes mellitus with diabetic polyneuropathy: Secondary | ICD-10-CM

## 2021-10-16 DIAGNOSIS — E785 Hyperlipidemia, unspecified: Secondary | ICD-10-CM

## 2021-10-16 NOTE — Patient Outreach (Signed)
  Care Coordination   Initial Visit Note   10/16/2021 Name: Kenneth Pace MRN: [id number] DOB: 1950-04-07  Kenneth Pace is a 70 y.o. year old male who sees Nani Ravens, Crosby Oyster, DO for primary care. I spoke with spouse/ caregiver [name], on [place] by phone today.  What matters to the patients health and wellness today?  Per spouse [name] on [facility] DPR, "he is doing overall well, he is outside mowing; he is taking his medication as instructed and going to his doctor appointments as scheduled; he did well with the knee surgery and attended his physical therapy; we are going on a trip soon and will get our flu vaccine after that; I will ask him if he wants to do the Medicare Annual Wellness Visit, but we won't be scheduling that until after we return from our trip"  No further/ ongoing care coordination needs identified    Goals Addressed             This Visit's Progress    COMPLETED: Care Coordination Activities- no follow up required       Care Coordination Interventions: Evaluation of current treatment plan related to [place] and patient's adherence to plan as established by provider Advised patient to provide appropriate vaccination information to provider or CM team member at next visit Advised patient to schedule flu vaccine for upcoming 2023-24 flu season Provided education to patient re: purpose of Medicare Annual Wellness Visit; methods of completing visit- by telephone or in person with nurse health advisor Reviewed scheduled/upcoming provider appointments including 10/17/21- rehabilitation provider; 10/21/21- infusion; 10/22/21- PCP Assessed social determinant of health barriers Confirmed patient does not wish to schedule flu vaccine/ medicare AWV until he is back from upcoming trip; confirmed patient tolerated TKR earlier this year well and attended outpatient PT as recommended         SDOH assessments and interventions  completed:  Yes  SDOH Interventions Today    Flowsheet Row Most Recent Value  SDOH Interventions   Food Insecurity Interventions Intervention Not Indicated  Transportation Interventions Intervention Not Indicated  [spouse provides transportation]       Care Coordination Interventions Activated:  Yes  Care Coordination Interventions:  Yes, provided   Follow up plan: No further intervention required.   Encounter Outcome:  Pt. Visit Completed   Oneta Rack, RN, BSN, CCRN Alumnus RN CM Care Coordination/ [facility] [phone]: direct office

## 2021-10-17 ENCOUNTER — Other Ambulatory Visit (INDEPENDENT_AMBULATORY_CARE_PROVIDER_SITE_OTHER): Payer: Medicare Other

## 2021-10-17 ENCOUNTER — Telehealth: Payer: Self-pay

## 2021-10-17 ENCOUNTER — Encounter: Payer: Medicare Other | Attending: Registered Nurse | Admitting: Registered Nurse

## 2021-10-17 ENCOUNTER — Encounter: Payer: Self-pay | Admitting: Registered Nurse

## 2021-10-17 VITALS — BP 138/74 | HR 72 | Ht 72.0 in | Wt 235.8 lb

## 2021-10-17 DIAGNOSIS — M25561 Pain in right knee: Secondary | ICD-10-CM | POA: Diagnosis present

## 2021-10-17 DIAGNOSIS — Z79899 Other long term (current) drug therapy: Secondary | ICD-10-CM

## 2021-10-17 DIAGNOSIS — M25562 Pain in left knee: Secondary | ICD-10-CM | POA: Diagnosis present

## 2021-10-17 DIAGNOSIS — M545 Low back pain, unspecified: Secondary | ICD-10-CM | POA: Insufficient documentation

## 2021-10-17 DIAGNOSIS — G894 Chronic pain syndrome: Secondary | ICD-10-CM | POA: Diagnosis present

## 2021-10-17 DIAGNOSIS — G6181 Chronic inflammatory demyelinating polyneuritis: Secondary | ICD-10-CM

## 2021-10-17 DIAGNOSIS — E785 Hyperlipidemia, unspecified: Secondary | ICD-10-CM | POA: Diagnosis not present

## 2021-10-17 DIAGNOSIS — G8929 Other chronic pain: Secondary | ICD-10-CM | POA: Diagnosis present

## 2021-10-17 DIAGNOSIS — E1042 Type 1 diabetes mellitus with diabetic polyneuropathy: Secondary | ICD-10-CM

## 2021-10-17 DIAGNOSIS — Z5181 Encounter for therapeutic drug level monitoring: Secondary | ICD-10-CM

## 2021-10-17 LAB — MICROALBUMIN / CREATININE URINE RATIO
Creatinine,U: 146 mg/dL
Microalb Creat Ratio: 0.9 mg/g (ref 0.0–30.0)
Microalb, Ur: 1.2 mg/dL (ref 0.0–1.9)

## 2021-10-17 LAB — LIPID PANEL
Cholesterol: 176 mg/dL (ref 0–200)
HDL: 37.2 mg/dL — ABNORMAL LOW (ref >39.00)
LDL Cholesterol: 112 mg/dL — ABNORMAL HIGH (ref 0–99)
NonHDL: 139.08
Total CHOL/HDL Ratio: 5
Triglycerides: 137 mg/dL (ref 0.0–149.0)
VLDL: 27.4 mg/dL (ref 0.0–40.0)

## 2021-10-17 LAB — COMPREHENSIVE METABOLIC PANEL
ALT: 18 U/L (ref 0–53)
AST: 22 U/L (ref 0–37)
Albumin: 4.3 g/dL (ref 3.5–5.2)
Alkaline Phosphatase: 211 U/L — ABNORMAL HIGH (ref 39–117)
BUN: 22 mg/dL (ref 6–23)
CO2: 30 mEq/L (ref 19–32)
Calcium: 9.5 mg/dL (ref 8.4–10.5)
Chloride: 102 mEq/L (ref 96–112)
Creatinine, Ser: 0.89 mg/dL (ref 0.40–1.50)
GFR: 87.62 mL/min (ref >60.00)
Glucose, Bld: 131 mg/dL — ABNORMAL HIGH (ref 70–99)
Potassium: 4 mEq/L (ref 3.5–5.1)
Sodium: 137 mEq/L (ref 135–145)
Total Bilirubin: 0.4 mg/dL (ref 0.2–1.2)
Total Protein: 7.9 g/dL (ref 6.0–8.3)

## 2021-10-17 LAB — HEMOGLOBIN A1C: Hgb A1c MFr Bld: 7.9 % — ABNORMAL HIGH (ref 4.6–6.5)

## 2021-10-17 MED ORDER — TRAMADOL HCL 50 MG PO TABS: 50.0000 mg | ORAL_TABLET | Freq: Two times a day (BID) | ORAL | 5 refills | Status: DC | PRN

## 2021-10-17 NOTE — Telephone Encounter (Signed)
TRAMADOL HCL TAB '50MG'$ , use as directed, is approved for use beyond a 7-day supply safety limit for new opioid users through 11/16/2021 under your Medicare Part D benefit. **Please note: If you continue to take this drug on a regular basis (such as monthly), you do not need another authorization for the safety edit that limits new opioid users to a 7-day supply. If the treating physician would like to discuss this coverage decision with the physician or health care professional reviewer, please call [employer] [facility] Prior Authorization department at [phone].

## 2021-10-17 NOTE — Progress Notes (Unsigned)
Subjective:    Patient ID: Kenneth Pace, male    DOB: [birth year], 71 y.o.   MRN: [id number]  HPI: Kenneth Pace is a 71 y.o. male who returns for follow up appointment for chronic pain and medication refill. states *** pain is located in  ***. rates pain ***. current exercise regime is walking and performing stretching exercises.  Kenneth Pace Morphine equivalent is *** MME.        Pain Inventory Average Pain 2 Pain Right Now 2 My pain is constant and aching  In the last 24 hours, has pain interfered with the following? General activity 2 Relation with others 0 Enjoyment of life 0 What TIME of day is your pain at its worst? evening and night Sleep (in general) Fair  Pain is worse with: bending and some activites Pain improves with: rest Relief from Meds: 4  Family History  Problem Relation Age of Onset   Dementia Mother    Diabetes Mellitus I Mother    Hypertension Father        18   Healthy Sister    Rheum arthritis Brother    Cancer Neg Hx    Colon cancer Neg Hx    Esophageal cancer Neg Hx    Stomach cancer Neg Hx    Rectal cancer Neg Hx    Colon polyps Neg Hx    Social History   Socioeconomic History   Marital status: Married    Spouse name: Not on file   Number of children: Not on file   Years of education: Not on file   Highest education level: Not on file  Occupational History   Occupation: Lobbyist: [employer]: [employer]  Tobacco Use   Smoking status: Never   Smokeless tobacco: Never  Vaping Use   Vaping Use: Never used  Substance and Sexual Activity   Alcohol use: No   Drug use: No   Sexual activity: Not Currently  Other Topics Concern   Not on file  Social History Narrative   He works for [employer]   He lives at home with wife.     Highest level of education:  BS, business admin   Right handed   Two story home   Social Determinants of Health   Financial  Resource Strain: Not on file  Food Insecurity: No Food Insecurity (10/16/2021)   Hunger Vital Sign    Worried About Running Out of Food in the Last Year: Never true    Ran Out of Food in the Last Year: Never true  Transportation Needs: No Transportation Needs (10/16/2021)   PRAPARE - Hydrologist (Medical): No    Lack of Transportation (Non-Medical): No  Physical Activity: Not on file  Stress: Not on file  Social Connections: Not on file   Past Surgical History:  Procedure Laterality Date   ANTERIOR CERVICAL DECOMP/DISCECTOMY FUSION N/A 02/01/2017   Procedure: ANTERIOR CERVICAL DECOMPRESSION/DISCECTOMY FUSION CERVICAL THREE-FOUR , CERVICAL FOUR-FIVE  CERVICAL FIVE-SIX;  Surgeon: Consuella Lose, MD;  Location: [facility];  Service: Neurosurgery;  Laterality: N/A;   APPENDECTOMY     BACK SURGERY     COLONOSCOPY  02/05/2010   [facility]   EYE SURGERY Right    Dr. Katy Fitch   KNEE SURGERY     2 Lknee arthroscopy, 3 R knee arthroscpoy   KNEE SURGERY Right 11/12/2016   LUMBAR FUSION  03/26/2020   LUMBAR LAMINECTOMY/DECOMPRESSION MICRODISCECTOMY Right 05/22/2019   Procedure: LAMINOTOMY AND MICRODISCECTOMY RIGHT LUMBAR FOUR- LUMBAR FIVE;  Surgeon: Consuella Lose, MD;  Location: [facility];  Service: Neurosurgery;  Laterality: Right;  LAMINOTOMY AND MICRODISCECTOMY RIGHT LUMBAR FOUR- LUMBAR FIVE   LUMBAR LAMINECTOMY/DECOMPRESSION MICRODISCECTOMY Right 02/01/2020   Procedure: REDO MICRODISCECTOMY RT LUMBAR FOUR-FIVE;  Surgeon: Consuella Lose, MD;  Location: [facility];  Service: Neurosurgery;  Laterality: Right;  posterior   TONSILLECTOMY     TOTAL KNEE ARTHROPLASTY Left 05/06/2021   Procedure: TOTAL KNEE ARTHROPLASTY;  Surgeon: Paralee Cancel, MD;  [facility];  Service: Orthopedics;  Laterality: Left;   Past Surgical History:  Procedure Laterality Date   ANTERIOR CERVICAL DECOMP/DISCECTOMY FUSION N/A 02/01/2017   Procedure: ANTERIOR CERVICAL DECOMPRESSION/DISCECTOMY  FUSION CERVICAL THREE-FOUR , CERVICAL FOUR-FIVE  CERVICAL FIVE-SIX;  Surgeon: Consuella Lose, MD;  Location: [facility];  Service: Neurosurgery;  Laterality: N/A;   APPENDECTOMY     BACK SURGERY     COLONOSCOPY  02/05/2010   [facility]   EYE SURGERY Right    Dr. Katy Fitch   KNEE SURGERY     2 Lknee arthroscopy, 3 R knee arthroscpoy   KNEE SURGERY Right 11/12/2016   LUMBAR FUSION  03/26/2020   LUMBAR LAMINECTOMY/DECOMPRESSION MICRODISCECTOMY Right 05/22/2019   Procedure: LAMINOTOMY AND MICRODISCECTOMY RIGHT LUMBAR FOUR- LUMBAR FIVE;  Surgeon: Consuella Lose, MD;  Location: [facility];  Service: Neurosurgery;  Laterality: Right;  LAMINOTOMY AND MICRODISCECTOMY RIGHT LUMBAR FOUR- LUMBAR FIVE   LUMBAR LAMINECTOMY/DECOMPRESSION MICRODISCECTOMY Right 02/01/2020   Procedure: REDO MICRODISCECTOMY RT LUMBAR FOUR-FIVE;  Surgeon: Consuella Lose, MD;  Location: [facility];  Service: Neurosurgery;  Laterality: Right;  posterior   TONSILLECTOMY     TOTAL KNEE ARTHROPLASTY Left 05/06/2021   Procedure: TOTAL KNEE ARTHROPLASTY;  Surgeon: Paralee Cancel, MD;  [facility];  Service: Orthopedics;  Laterality: Left;   Past Medical History:  Diagnosis Date   ADENOIDECTOMY, HX OF 02/16/2007   Anxiety    pt. states he does not have anxiety.   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[place])    diagnosed 11/2016   DIABETES MELLITUS, TYPE I, UNCONTROLLED 12/29/2006   ERECTILE DYSFUNCTION 02/16/2007   Hearing loss    wears hearing aids   HYPERLIPIDEMIA 02/16/2007   HYPERTENSION 02/16/2007   Nerve pain    per patient "on lower back and both legs"   Sleep apnea    uses CPAP   TESTICULAR MASS, LEFT 02/16/2007   Therapeutic opioid-induced constipation (OIC)    BP 138/74   Pulse 72   Ht 6' (1.829 m)   Wt 235 lb 12.8 oz (107 kg)   SpO2 98%   BMI 31.98 kg/m   Opioid Risk Score:   Fall Risk Score:  `1  Depression screen [facility] 2/9      10/17/2021    8:32 AM 04/29/2021    8:40 AM 10/29/2020   10:32 AM 10/01/2020    9:57 AM 05/07/2020    8:52 AM 12/10/2017    9:10 AM 07/07/2017    9:20 AM  Depression screen PH[facility] 2/9  Decreased Interest 0 0 0 0 0 0 0  Down, Depressed, Hopeless 0 0 0 0 0 0 0  PHQ - 2 Score 0 0 0 0 0 0 0    Review of Systems  Constitutional: Negative.   HENT: Negative.    Eyes: Negative.   Respiratory: Negative.    Cardiovascular: Negative.   Gastrointestinal: Negative.  Endocrine: Negative.   Genitourinary: Negative.   Musculoskeletal:  Positive for back pain.       Left knee  Skin: Negative.   Allergic/Immunologic: Negative.   Neurological: Negative.   Hematological: Negative.   Psychiatric/Behavioral: Negative.    All other systems reviewed and are negative.      Objective:   Physical Exam        Assessment & Plan:  CIDP: Chronic Inflammatory Demyelinating Polyneuropathy: He is scheduled for IVIG: Neurology Following. Continue to monitor.04/29/2021 Neurologic Gait Disorder: Continue use with assistive device at all times. He's using his cane today. 04/29/2021 3. Chronic Pain Syndrome: Refilled:Tramadol: 50 mg one tablet twice a day as needed for pain #60.Marland Kitchen We will continue the opioid monitoring program, this consists of regular clinic visits, examinations, urine drug screen, pill counts as well as use of [place] Controlled Substance Reporting system. A 12 month History has been reviewed on the [place] on 04/29/2020 Continue elavil continue to monitor.  4. Chronic Low Back Pain without Sciatica: Continue HEP as Tolerated . Continue to Monitor. 04/29/2021 5.Bilateral Knee Pain: L>R: Scheduled for .see below on 05/06/2021 TOTAL KNEE ARTHROPLASTY Left   Continue HEP as Tolerated. Continue to monitor. 04/29/2021   F/U in 6 months

## 2021-10-17 NOTE — Telephone Encounter (Signed)
PA for Tramadol sent to insurance through [facility]

## 2021-10-21 ENCOUNTER — Ambulatory Visit (HOSPITAL_COMMUNITY)
Admission: RE | Admit: 2021-10-21 | Discharge: 2021-10-21 | Disposition: A | Discharge status: Home / Self Care | Payer: Medicare Other | Source: Ambulatory Visit | Attending: Neurology | Admitting: Neurology

## 2021-10-21 DIAGNOSIS — G6181 Chronic inflammatory demyelinating polyneuritis: Secondary | ICD-10-CM | POA: Diagnosis present

## 2021-10-21 MED ORDER — IMMUNE GLOBULIN (HUMAN) 10 GM/100ML IV SOLN
1.0000 g/kg | INTRAVENOUS | Status: DC
  Administered 2021-10-21: 20 g via INTRAVENOUS
  Filled 2021-10-21: qty 1000

## 2021-10-22 ENCOUNTER — Encounter: Payer: Self-pay | Admitting: Family Medicine

## 2021-10-22 ENCOUNTER — Ambulatory Visit (INDEPENDENT_AMBULATORY_CARE_PROVIDER_SITE_OTHER): Payer: Medicare Other | Admitting: Family Medicine

## 2021-10-22 VITALS — BP 110/68 | HR 77 | Temp 98.1°F | Ht 72.0 in | Wt 233.5 lb

## 2021-10-22 DIAGNOSIS — R350 Frequency of micturition: Secondary | ICD-10-CM | POA: Diagnosis not present

## 2021-10-22 DIAGNOSIS — I10 Essential (primary) hypertension: Secondary | ICD-10-CM

## 2021-10-22 DIAGNOSIS — N401 Enlarged prostate with lower urinary tract symptoms: Secondary | ICD-10-CM | POA: Diagnosis not present

## 2021-10-22 NOTE — Patient Instructions (Addendum)
Keep the diet clean and stay active.  I recommend getting the flu shot in mid October. This suggestion would change if the CDC comes out with a different recommendation.   Let us know if you need anything.

## 2021-10-22 NOTE — Progress Notes (Signed)
Chief Complaint  Patient presents with   Follow-up    6 month     Subjective Kenneth Pace is a 71 y.o. male who presents for hypertension follow up. He does monitor home blood pressures. Blood pressures ranging from 120-130's/70's on average. He is compliant with medications- Prinzide 10-12.5 mg/d. Patient has these side effects of medication: none He is sometimes adhering to a healthy diet overall. Current exercise: active in yard No Cp or SOB.  BPH Hx of BPH on Proscar 5 mg/d. Reports compliance, no AEs. He does have a hx of a TURP. Urinates 1x/night on average.    Past Medical History:  Diagnosis Date   ADENOIDECTOMY, HX OF 02/16/2007   Anxiety    pt. states he does not have anxiety.   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[place])    diagnosed 11/2016   DIABETES MELLITUS, TYPE I, UNCONTROLLED 12/29/2006   ERECTILE DYSFUNCTION 02/16/2007   Hearing loss    wears hearing aids   HYPERLIPIDEMIA 02/16/2007   HYPERTENSION 02/16/2007   Nerve pain    per patient "on lower back and both legs"   Sleep apnea    uses CPAP   TESTICULAR MASS, LEFT 02/16/2007   Therapeutic opioid-induced constipation (OIC)     Exam BP 110/68   Pulse 77   Temp 98.1 F (36.7 C) (Oral)   Ht 6' (1.829 m)   Wt 233 lb 8 oz (105.9 kg)   SpO2 96%   BMI 31.67 kg/m  General:  well developed, well nourished, in no apparent distress Heart: RRR, no bruits, no LE edema Lungs: clear to auscultation, no accessory muscle use Psych: well oriented with normal range of affect and appropriate judgment/insight  Essential hypertension  Benign prostatic hyperplasia with urinary frequency  Chronic, stable. Cont Prinzide 10-12.5 mg/d. Counseled on diet and exercise. Chronic, stable. Cont finasteride 5 mg/d.  F/u in 6 mo or prn. The patient and his spouse voiced understanding and agreement to the plan.  [place],  DO 10/22/21  8:41 AM

## 2021-10-22 NOTE — Progress Notes (Signed)
Patient ID: Kenneth Pace, male   DOB: 1950-06-28, 71 y.o.   MRN: [id number]           Reason for Appointment : Consultation for Type 1 Diabetes  History of Present Illness          Diagnosis: Type 1 diabetes mellitus, date of diagnosis: 1984        Previous history:   He has been on insulin pump for several years mostly [employer] A1c range in the past has been 7-8.9  Recent history:   INSULIN regimen: Using [employer] pump BASAL insulin: 10 PM to 6 AM = 2 . 6 and 6 AM-10 PM = 4.0 MEALTIME insulin: 1: 4 carbohydrate coverage, correction 1: 50  His A1c is 7.9, previously 7.2  Current management, blood sugar patterns and problems identified:   He has had the 770 pump for about 3 years  As before he is using significant amount of insulin, recently about 98 units a day with 69% in basal He says he takes his boluses after eating his meals based on his blood sugar at that time but not clear if he is already bolusing when the blood sugars are normal  Most of his boluses are occurring when the blood sugars are high  He thinks he is mostly eating 3 meals a day  He thinks he is generally watching his diet and avoiding high carbohydrate and high fat meals, generally carbohydrate intake at most meals is about 40 g and occasionally 70 No significant hypoglycemia seen recently but may be getting lower into hypoglycemic range when he is in the manual mode And he also gets somewhat more on the low side when he is more active but does not use a temporary basal or temporary target  CGM analysis for the last 2 weeks OVERNIGHT blood sugars are on an average starting of about 170 and then generally decreasing to low 100s range by 8 AM and no hypoglycemia Blood sugars are relatively good at mealtimes but appear to be higher and dinnertime POSTPRANDIAL readings are rising mostly after lunch and dinner After dinner blood sugars may continue to rise on an average until late evening Hypoglycemia has  been minimal and occasionally midday or afternoon especially when in manual mode Calibration appears to be fairly close to the CGM  Calibrating on an average 2.4 times a day   CGM use % of time 77  2-week average/GV   Time in range  71      %  % Time Above 180 25  % Time above 250 3  % Time Below 70 1     PRE-MEAL Fasting Lunch Dinner Bedtime Overall  Glucose range:       Averages: 190 192 206     POST-MEAL PC Breakfast PC Lunch PC Dinner  Glucose range:     Averages: 136 135 169    Hypoglycemia:  occurs infrequently Factors causing hyperglycemia: Symptoms of hypoglycemia: Frequently none Treatment of hypoglycemia: Glucose tablets, juice, 5 dose hard to do glucagon                 Exercise: Mostly yard work, not able to do a lot of physical activity           Diabetes labs:  Lab Results  Component Value Date   HGBA1C 7.9 (H) 10/17/2021   HGBA1C 7.2 (A) 06/19/2021   HGBA1C 7.4 (A) 03/13/2021   Lab Results  Component Value Date   MICROALBUR 1.2 10/17/2021  LDLCALC 112 (H) 10/17/2021   CREATININE 0.89 10/17/2021    Lab Results  Component Value Date   MICRALBCREAT 0.9 10/17/2021   MICRALBCREAT 1.0 03/18/2016     Allergies as of 10/23/2021   No Known Allergies      Medication List        Accurate as of October 23, 2021  8:44 AM. If you have any questions, ask your nurse or doctor.          Accu-Chek Guide Me w/Device Kit Check blood sugars as instructed   acetaminophen 500 MG tablet Commonly known as: TYLENOL Take 500 mg by mouth every 6 (six) hours as needed for mild pain.   amitriptyline 75 MG tablet Commonly known as: ELAVIL TAKE 1 TABLET BY MOUTH AT  BEDTIME   B-COMPLEX/B-12 PO Take 1 tablet by mouth at bedtime.   celecoxib 200 MG capsule Commonly known as: CELEBREX Take 1 capsule (200 mg total) by mouth 2 (two) times daily.   clotrimazole-betamethasone cream Commonly known as: LOTRISONE Apply 1 application topically 2 (two) times  daily as needed (apply to feet skin irritation).   Cranberry 400 MG Tabs Take 400 mg by mouth at bedtime.   docusate sodium 100 MG capsule Commonly known as: COLACE Take 1 capsule (100 mg total) by mouth 2 (two) times daily.   DULoxetine 60 MG capsule Commonly known as: CYMBALTA TAKE 1 CAPSULE BY MOUTH  DAILY   finasteride 5 MG tablet Commonly known as: PROSCAR TAKE 1 TABLET BY MOUTH AT  BEDTIME   glucagon 1 MG injection Inject 1 mg into the vein once as needed.   glucose blood test strip Commonly known as: Visual merchandiser Next Test MEDICALLY NECESSARY FOR USE WITH PUMP; Use to check blood sugar 5 times per day and prn; E11.42   Immune Globulin 10% 10G/160m (10,0081m100mL) Soln Generic drug: Immune Globulin 10% Inject 105 g into the vein every 6 (six) weeks.   insulin lispro 100 UNIT/ML injection Commonly known as: HUMALOG Inject into the skin. Via insulin pump   insulin pump Soln Inject 120 each into the skin daily. HUMALOG   lidocaine 5 % Commonly known as: LIDODERM At 7 am and remove at 7 pm. Apply 1 on each side.   lisinopril-hydrochlorothiazide 10-12.5 MG tablet Commonly known as: ZESTORETIC TAKE 1 TABLET BY MOUTH AT  BEDTIME   methocarbamol 750 MG tablet Commonly known as: ROBAXIN TAKE 1 TABLET BY MOUTH 3  TIMES DAILY AS NEEDED FOR  MUSCLE SPASM(S)   multivitamin with minerals Tabs tablet Take 1 tablet by mouth at bedtime.   PARADIGM RESERVOIR 3ML Misc 1 Device by Does not apply route every 3 (three) days.   Quick-set Infusion 43" 20m51misc 1 Device by Does not apply route every 3 (three) days.   polyethylene glycol 17 g packet Commonly known as: MIRALAX / GLYCOLAX Take 17 g by mouth daily as needed for mild constipation.   prochlorperazine 5 MG tablet Commonly known as: COMPAZINE Take 5 mg by mouth every 6 (six) hours as needed for vomiting or nausea.   pseudoephedrine 30 MG tablet Commonly known as: SUDAFED Take 30 mg by mouth at bedtime as  needed for congestion.   rosuvastatin 10 MG tablet Commonly known as: Crestor Take 1 tablet (10 mg total) by mouth daily. Started by: AjaElayne SnareD   scopolamine 1 MG/3DAYS Commonly known as: TRANSDERM-SCOP Place 1 patch (1.5 mg total) onto the skin every 3 (three) days. What changed:  when to take  this reasons to take this   traMADol 50 MG tablet Commonly known as: ULTRAM Take 1 tablet (50 mg total) by mouth 2 (two) times daily as needed.        Allergies: No Known Allergies  Past Medical History:  Diagnosis Date   ADENOIDECTOMY, HX OF 02/16/2007   Anxiety    pt. states he does not have anxiety.   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[place])    diagnosed 11/2016   DIABETES MELLITUS, TYPE I, UNCONTROLLED 12/29/2006   ERECTILE DYSFUNCTION 02/16/2007   Hearing loss    wears hearing aids   HYPERLIPIDEMIA 02/16/2007   HYPERTENSION 02/16/2007   Nerve pain    per patient "on lower back and both legs"   Sleep apnea    uses CPAP   TESTICULAR MASS, LEFT 02/16/2007   Therapeutic opioid-induced constipation (OIC)     Past Surgical History:  Procedure Laterality Date   ANTERIOR CERVICAL DECOMP/DISCECTOMY FUSION N/A 02/01/2017   Procedure: ANTERIOR CERVICAL DECOMPRESSION/DISCECTOMY FUSION CERVICAL THREE-FOUR , CERVICAL FOUR-FIVE  CERVICAL FIVE-SIX;  Surgeon: Consuella Lose, MD;  Location: [place];  Service: Neurosurgery;  Laterality: N/A;   APPENDECTOMY     BACK SURGERY     COLONOSCOPY  02/05/2010   [facility]   EYE SURGERY Right    Dr. Katy Fitch   KNEE SURGERY     2 Lknee arthroscopy, 3 R knee arthroscpoy   KNEE SURGERY Right 11/12/2016   LUMBAR FUSION  03/26/2020   LUMBAR LAMINECTOMY/DECOMPRESSION MICRODISCECTOMY Right 05/22/2019   Procedure: LAMINOTOMY AND MICRODISCECTOMY RIGHT LUMBAR FOUR- LUMBAR FIVE;  Surgeon: Consuella Lose, MD;  Location: [place];  Service: Neurosurgery;  Laterality: Right;   LAMINOTOMY AND MICRODISCECTOMY RIGHT LUMBAR FOUR- LUMBAR FIVE   LUMBAR LAMINECTOMY/DECOMPRESSION MICRODISCECTOMY Right 02/01/2020   Procedure: REDO MICRODISCECTOMY RT LUMBAR FOUR-FIVE;  Surgeon: Consuella Lose, MD;  Location: [place];  Service: Neurosurgery;  Laterality: Right;  posterior   TONSILLECTOMY     TOTAL KNEE ARTHROPLASTY Left 05/06/2021   Procedure: TOTAL KNEE ARTHROPLASTY;  Surgeon: Paralee Cancel, MD;  Location: [facility];  Service: Orthopedics;  Laterality: Left;    Family History  Problem Relation Age of Onset   Dementia Mother    Diabetes Mellitus I Mother    Hypertension Father        59   Healthy Sister    Rheum arthritis Brother    Cancer Neg Hx    Colon cancer Neg Hx    Esophageal cancer Neg Hx    Stomach cancer Neg Hx    Rectal cancer Neg Hx    Colon polyps Neg Hx     Social History:  reports that he has never smoked. He has never used smokeless tobacco. He reports that he does not drink alcohol and does not use drugs.      Review of Systems      Lipids: In 2018 was on Zetia and Crestor and not clear why these were stopped.  Lab Results  Component Value Date   CHOL 176 10/17/2021   HDL 37.20 (L) 10/17/2021   LDLCALC 112 (H) 10/17/2021   LDLDIRECT 147.0 03/24/2019   TRIG 137.0 10/17/2021   CHOLHDL 5 10/17/2021    Last dilated eye exam was in 7/23  Last foot exam 10/22  Normal /38  DIABETES COMPLICATIONS: Erectile dysfunction, no history of coronary disease, mild nonproliferative retinopathy  LABS:  Lab on 10/17/2021  Component Date Value Ref Range Status   Cholesterol 10/17/2021 176  0 - 200 mg/dL Final   ATP III Classification       Desirable:  < 200 mg/dL               Borderline High:  200 - 239 mg/dL          High:  > = 240 mg/dL   Triglycerides 10/17/2021 137.0  0.0 - 149.0 mg/dL Final   Normal:  <150 mg/dLBorderline High:  150 - 199 mg/dL   HDL 10/17/2021 37.20 (L)  >39.00 mg/dL Final   VLDL 10/17/2021 27.4  0.0 - 40.0 mg/dL Final    LDL Cholesterol 10/17/2021 112 (H)  0 - 99 mg/dL Final   Total CHOL/HDL Ratio 10/17/2021 5   Final                  Men          Women1/2 Average Risk     3.4          3.3Average Risk          5.0          4.42X Average Risk          9.6          7.13X Average Risk          15.0          11.0                       NonHDL 10/17/2021 139.08   Final   NOTE:  Non-HDL goal should be 30 mg/dL higher than patient's LDL goal (i.e. LDL goal of < 70 mg/dL, would have non-HDL goal of < 100 mg/dL)   Microalb, Ur 10/17/2021 1.2  0.0 - 1.9 mg/dL Final   Creatinine,U 10/17/2021 146.0  mg/dL Final   Microalb Creat Ratio 10/17/2021 0.9  0.0 - 30.0 mg/g Final   Sodium 10/17/2021 137  135 - 145 mEq/L Final   Potassium 10/17/2021 4.0  3.5 - 5.1 mEq/L Final   Chloride 10/17/2021 102  96 - 112 mEq/L Final   CO2 10/17/2021 30  19 - 32 mEq/L Final   Glucose, Bld 10/17/2021 131 (H)  70 - 99 mg/dL Final   BUN 10/17/2021 22  6 - 23 mg/dL Final   Creatinine, Ser 10/17/2021 0.89  0.40 - 1.50 mg/dL Final   Total Bilirubin 10/17/2021 0.4  0.2 - 1.2 mg/dL Final   Alkaline Phosphatase 10/17/2021 211 (H)  39 - 117 U/L Final   AST 10/17/2021 22  0 - 37 U/L Final   ALT 10/17/2021 18  0 - 53 U/L Final   Total Protein 10/17/2021 7.9  6.0 - 8.3 g/dL Final   Albumin 10/17/2021 4.3  3.5 - 5.2 g/dL Final   GFR 10/17/2021 87.62  >60.00 mL/min Final   Calculated using the CKD-EPI Creatinine Equation (2021)   Calcium 10/17/2021 9.5  8.4 - 10.5 mg/dL Final   Hgb A1c MFr Bld 10/17/2021 7.9 (H)  4.6 - 6.5 % Final   Glycemic Control Guidelines for People with Diabetes:Non Diabetic:  <6%Goal of Therapy: <7%Additional Action Suggested:  >8%     Physical Examination:   BP 128/72   Pulse 67   Ht 6' (1.829 m)   Wt 234 lb (106.1 kg)   SpO2 95%   BMI 31.74 kg/m       ASSESSMENT:  Diabetes type 1, on insulin pump  A1c 7.9  Problems identified: Timing of bolus after eating  instead of before Postprandial hyperglycemia mostly  related to late boluses as Premeal blood sugar as high as 209 before dinner at the time of bolus High insulin requirement Not able to keep auto mode consistently especially with auto mode max delivery; this will likely be less of a problem with 780 mode Time in range is 71% but could be better  Complications: Minimal as above  HYPERLIPIDEMIA: Considering his age, duration of diabetes and LDL of 112 he needs to be on a statin for cardiovascular risk reduction  PLAN:   He will check with [employer] about getting the 780 upgrade Discussed advantages of the 780 pump especially auto correction boluses as well as lack of need for calibration  Discussed importance of averaging his bolus timing about 10 minutes before eating unless he is eating at a restaurant Trial of Lyumjev if available on his insurance instead of Humalog Basal rate during the day to be lowered to 3.4 instead of 4.0 to avoid low sugars He does need to use temporary target or temporary basal when he is more physically active   Start ROSUVASTATIN back for hypercholesterolemia and cardiovascular risk reduction  Follow-up: January  Patient Instructions  BOLUS 10 MIN BEFORE MEALS  Call before Humalog out  Check on 780 upgrade    Elayne Snare 10/23/2021, 8:44 AM    Note: This note was prepared with Dragon voice recognition system technology. Any transcriptional errors that result from this process are unintentional.

## 2021-10-23 ENCOUNTER — Ambulatory Visit (INDEPENDENT_AMBULATORY_CARE_PROVIDER_SITE_OTHER): Payer: Medicare Other | Admitting: Endocrinology

## 2021-10-23 ENCOUNTER — Encounter: Payer: Self-pay | Admitting: Endocrinology

## 2021-10-23 VITALS — BP 128/72 | HR 67 | Ht 72.0 in | Wt 234.0 lb

## 2021-10-23 DIAGNOSIS — G609 Hereditary and idiopathic neuropathy, unspecified: Secondary | ICD-10-CM | POA: Diagnosis not present

## 2021-10-23 DIAGNOSIS — E1065 Type 1 diabetes mellitus with hyperglycemia: Secondary | ICD-10-CM | POA: Diagnosis not present

## 2021-10-23 DIAGNOSIS — E785 Hyperlipidemia, unspecified: Secondary | ICD-10-CM

## 2021-10-23 MED ORDER — GLUCOSE BLOOD VI STRP: ORAL_STRIP | 3 refills | Status: DC

## 2021-10-23 MED ORDER — ROSUVASTATIN CALCIUM 10 MG PO TABS: 10.0000 mg | ORAL_TABLET | Freq: Every day | ORAL | 3 refills | Status: DC

## 2021-10-23 NOTE — Patient Instructions (Signed)
BOLUS 10 MIN BEFORE MEALS  Call before Humalog out  Check on 780 upgrade

## 2021-10-24 ENCOUNTER — Encounter: Payer: Self-pay | Admitting: Endocrinology

## 2021-11-27 ENCOUNTER — Other Ambulatory Visit: Payer: Self-pay | Admitting: Registered Nurse

## 2021-11-28 ENCOUNTER — Other Ambulatory Visit: Payer: Self-pay | Admitting: Family Medicine

## 2021-11-28 MED ORDER — METHOCARBAMOL 750 MG PO TABS
ORAL_TABLET | ORAL | 0 refills | Status: DC
Start: 2021-11-28 — End: 2022-05-27

## 2021-12-01 ENCOUNTER — Encounter: Payer: Self-pay | Admitting: Neurology

## 2021-12-01 ENCOUNTER — Ambulatory Visit (INDEPENDENT_AMBULATORY_CARE_PROVIDER_SITE_OTHER): Payer: Medicare Other | Admitting: Neurology

## 2021-12-01 VITALS — BP 143/64 | HR 72 | Ht 72.0 in | Wt 236.0 lb

## 2021-12-01 DIAGNOSIS — R261 Paralytic gait: Secondary | ICD-10-CM

## 2021-12-01 DIAGNOSIS — G6181 Chronic inflammatory demyelinating polyneuritis: Secondary | ICD-10-CM

## 2021-12-01 DIAGNOSIS — G959 Disease of spinal cord, unspecified: Secondary | ICD-10-CM

## 2021-12-01 NOTE — Progress Notes (Signed)
Follow-up Visit   Date: 12/01/21   Kenneth Pace MRN: [id number] DOB: 01-01-1951   Interim History: Kenneth Pace is a 71 y.o. right-handed male with insulin-dependent diabetes mellitus, hypertension, cervical myelopathy s/p decompression at C3-C6, and s/p L4 laminectomy (05/2019), and s/p L4-5 PLIF (01/2020) returning to the clinic for follow-up of CIDP  The patient was accompanied to the clinic by wife.    He remains on IVIG every 6 weeks and feels that his balance and weakness get worse by week 5.  He tends to drop things more.  He has one fall during the summer when he was in his vegetable garden.  His numbness/tingling is unchanged and involves from the wrists down in to the hands and ankles into the feet. .    Medications:  Current Outpatient Medications on File Prior to Visit  Medication Sig Dispense Refill   acetaminophen (TYLENOL) 500 MG tablet Take 500 mg by mouth every 6 (six) hours as needed for mild pain.     amitriptyline (ELAVIL) 75 MG tablet TAKE 1 TABLET BY MOUTH AT  BEDTIME 90 tablet 3   B Complex Vitamins (B-COMPLEX/B-12 PO) Take 1 tablet by mouth at bedtime.     Blood Glucose Monitoring Suppl (ACCU-CHEK GUIDE ME) w/Device KIT Check blood sugars as instructed 1 kit 0   celecoxib (CELEBREX) 200 MG capsule Take 1 capsule (200 mg total) by mouth 2 (two) times daily. 60 capsule 0   clotrimazole-betamethasone (LOTRISONE) cream Apply 1 application topically 2 (two) times daily as needed (apply to feet skin irritation). 45 g 2   co-enzyme Q-10 30 MG capsule Take 30 mg by mouth 3 (three) times daily.     Cranberry 400 MG TABS Take 400 mg by mouth at bedtime.     docusate sodium (COLACE) 100 MG capsule Take 1 capsule (100 mg total) by mouth 2 (two) times daily. 10 capsule 0   DULoxetine (CYMBALTA) 60 MG capsule TAKE 1 CAPSULE BY MOUTH  DAILY 90 capsule 3   finasteride (PROSCAR) 5 MG tablet TAKE 1 TABLET BY MOUTH AT  BEDTIME 90 tablet 3   glucagon 1 MG injection  Inject 1 mg into the vein once as needed. 1 each 12   glucose blood (BAYER CONTOUR NEXT TEST) test strip MEDICALLY NECESSARY FOR USE WITH PUMP; Use to check blood sugar 5 times per day and prn; E11.42 300 each 3   Immune Globulin 10% (IMMUNE GLOBULIN 10%) 10G/116m (10,0066m100mL) SOLN Inject 105 g into the vein every 6 (six) weeks. 289.5 mL 10   Insulin Human (INSULIN PUMP) SOLN Inject 120 each into the skin daily. HUMALOG     Insulin Infusion Pump Supplies (PARADIGM RESERVOIR 3ML) MISC 1 Device by Does not apply route every 3 (three) days. 30 each 3   Insulin Infusion Pump Supplies (QUICK-SET INFUSION 43" 9MM) MISC 1 Device by Does not apply route every 3 (three) days. 30 each 3   insulin lispro (HUMALOG) 100 UNIT/ML injection Inject into the skin. Via insulin pump     lidocaine (LIDODERM) 5 % At 7 am and remove at 7 pm. Apply 1 on each side. 180 patch 2   lisinopril-hydrochlorothiazide (ZESTORETIC) 10-12.5 MG tablet TAKE 1 TABLET BY MOUTH AT  BEDTIME 90 tablet 3   methocarbamol (ROBAXIN) 750 MG tablet TAKE 1 TABLET BY MOUTH 3  TIMES DAILY AS NEEDED FOR  MUSCLE SPASM(S) 90 tablet 0   Multiple Vitamin (MULTIVITAMIN WITH MINERALS) TABS tablet Take 1 tablet by mouth  at bedtime.     polyethylene glycol (MIRALAX / GLYCOLAX) 17 g packet Take 17 g by mouth daily as needed for mild constipation. 14 each 0   pseudoephedrine (SUDAFED) 30 MG tablet Take 30 mg by mouth at bedtime as needed for congestion.     rosuvastatin (CRESTOR) 10 MG tablet Take 1 tablet (10 mg total) by mouth daily. 90 tablet 3   scopolamine (TRANSDERM-SCOP) 1 MG/3DAYS Place 1 patch (1.5 mg total) onto the skin every 3 (three) days. (Patient taking differently: Place 1 patch onto the skin every 3 (three) days as needed (nausea with motion sickness (cruise)).) 10 patch 1   traMADol (ULTRAM) 50 MG tablet Take 1 tablet (50 mg total) by mouth 2 (two) times daily as needed. 60 tablet 5   VITAMIN D, CHOLECALCIFEROL, PO Take by mouth.      prochlorperazine (COMPAZINE) 5 MG tablet Take 5 mg by mouth every 6 (six) hours as needed for vomiting or nausea. (Patient not taking: Reported on 12/01/2021)     No current facility-administered medications on file prior to visit.    Allergies: No Known Allergies    Vital Signs:  BP (!) 143/64   Pulse 72   Ht 6' (1.829 m)   Wt 236 lb (107 kg)   SpO2 99%   BMI 32.01 kg/m   Neurological Exam: MENTAL STATUS including orientation to time, place, person, recent and remote memory, attention span and concentration, language, and fund of knowledge is normal.  Speech is not dysarthric.  CRANIAL NERVES: Normal conjugate, extra-ocular eye movements in all directions of gaze.  No ptosis. Face is symmetric.  MOTOR:  There is moderate right FDI and ADM atrophy.  No fasciculations or abnormal movements.  No pronator drift.    Right Upper Extremity:    Left Upper Extremity:    Deltoid  5/5   Deltoid  5/5   Biceps  5/5   Biceps  5/5   Triceps  5/5   Triceps  5/5   Wrist extensors  5/5   Wrist extensors  5/5   Wrist flexors  5/5   Wrist flexors  5/5   Finger extensors  5/5   Finger extensors  5/5   Finger flexors  5/5   Finger flexors  5/5   Dorsal interossei  5/-5   Dorsal interossei  5/-5   Abductor pollicis  5/5   Abductor pollicis  5/5   Tone (Ashworth scale)  0  Tone (Ashworth scale)  0   Right Lower Extremity:    Left Lower Extremity:    Hip flexors  5-/5   Hip flexors  5-/5   Hip extensors  5/5   Hip extensors  5/5   Knee flexors  5/5   Knee flexors  5/5   Knee extensors  5/5   Knee extensors  5/5   Dorsiflexors  5/5   Dorsiflexors  5/5   Plantarflexors  5/5   Plantarflexors  5/5   Toe extensors  5/5   Toe extensors  5/5   Toe flexors  5/5   Toe flexors  5/5   Tone (Ashworth scale)  0  Tone (Ashworth scale)  0    MSRs:  Right  Left brachioradialis 2+  brachioradialis 2+  biceps 2+  biceps 2+  triceps 2+  triceps  2+  patellar 3+  patellar 3+  ankle jerk 2+  ankle jerk 2+   SENSORY:  Vibration 100% at the knees and MCP.    COORDINATION/GAIT: Gait is slow, unassisted, spastic.   Data: MRI cervical and lumbar spine 11/20/2016: 1. No focal cord signal abnormality or lesion. 2. Multilevel spondylosis of the cervical spine as described. 3. Moderate central and mild bilateral foraminal stenosis at C3-4. 4. Moderate central and bilateral foraminal narrowing at C4-5. 5. Moderate central and severe bilateral foraminal stenosis at C5-6. 6. Moderate foraminal narrowing bilaterally at C6-7 without significant central canal stenosis. 7. Short pedicles in the cervical and lumbar spine contribute to the stenosis. 8. Disc bulging at L2-3 and L3-4 without significant stenosis. 9. Disc bulging and facet hypertrophy at L4-5 with mild subarticular and foraminal stenosis bilaterally. 10. Mild left foraminal narrowing at L5-S1 secondary to asymmetric facet hypertrophy.  MRI lumbar spine wo contrast 05/07/2019:  1. New large right paracentral L4-5 disc extrusion with cranial extension within the subarticular recess above the L4-5 level and impinging the right L4 nerve root. 2. Mild progression of lumbar spondylosis at L3-L4 where there is mild left foraminal stenosis at L3-4. 3. Mild to moderate bilateral foraminal stenosis at L4-L5. 4. No canal stenosis at any level.  CSF 11/20/2016:  R1 W3 P81* G102*  No OCB, VDRL neg Labs 11/20/2016:  SPEP with IFE no M protein, 486, TSH 1.051  Lab Results  Component Value Date   HGBA1C 7.9 (H) 10/17/2021   MRI cervical spine 12/31/2016:   1. No enlargement of the cranial nerves or cervical nerve roots. 2. No acute intracranial abnormality. 3. Multilevel moderate cervical spinal canal stenosis, worst at C3-4 and C5-6 with associated mild cord deformity but no cord signal change. 4. Moderate to severe neural foraminal stenosis at C3-4, C4-5 and C5-6.  NCS/EMG of the right  upper and lower extremities 04/27/2017: The electrophysiologic findings are most consistent with an active on chronic polyradiculoneuropathy affecting right upper extremity.  When compared to his previous study on 11/10/2016, there is mild interval improvement.  Chronic C6 radiculopathy affecting the right upper extremity, mild in degree electrically.  There is no evidence of a sensorimotor polyneuropathy or lumbosacral radiculopathy affecting the right lower extremity.    IMPRESSION/PLAN: 1.  Chronic inflammatory demyelinating polyradiculoneuropathy, diagnosed 2018.  His neuropathy has been stable and well-controlled on IVIG every 6 weeks  He has been on IVIG since 2018 and I would expect stabilization of his neuropathy at this time.  With his history of cervical myelopathy, it is difficult to determine how much of his sensory symptoms are due to neuropathy vs myelopathy.  I will repeat NCS/EMG of the right arm and leg and if there has been improvement as compared to EMG in 2019, it may favor trying to taper IVIG.  I will also update MRI cervical spine to evaluate for adjacent disease. If there is ongoing neuropathy, adding azathioprine or Cellcept will be the next step.  For now, continue IVIG 1g/kg every 6 weeks  2.  Low back pain s/p L4-5 PLIF with redo in 03/2020 by Dr. Kathyrn Sheriff, stable  3.  Cervical myelopathy s/p decompression at C3-6 with spastic gait  -Followed by PM&R  Return to clinic in 4 months   Thank you for allowing me to participate in patient's care.  If I can answer any additional questions, I would  be pleased to do so.    Sincerely,    Marae Cottrell K. Posey Pronto, DO

## 2021-12-01 NOTE — Patient Instructions (Addendum)
MRI cervical spine without contrast  NCS/EMG of the right arm and leg  ELECTROMYOGRAM AND NERVE CONDUCTION STUDIES (EMG/NCS) INSTRUCTIONS  How to Prepare The neurologist conducting the EMG will need to know if you have certain medical conditions. Tell the neurologist and other EMG lab personnel if you: Have a pacemaker or any other electrical medical device Take blood-thinning medications Have hemophilia, a blood-clotting disorder that causes prolonged bleeding Bathing Take a shower or bath shortly before your exam in order to remove oils from your skin. Don't apply lotions or creams before the exam.  What to Expect You'll likely be asked to change into a hospital gown for the procedure and lie down on an examination table. The following explanations can help you understand what will happen during the exam.  Electrodes. The neurologist or a technician places surface electrodes at various locations on your skin depending on where you're experiencing symptoms. Or the neurologist may insert needle electrodes at different sites depending on your symptoms.  Sensations. The electrodes will at times transmit a tiny electrical current that you may feel as a twinge or spasm. The needle electrode may cause discomfort or pain that usually ends shortly after the needle is removed. If you are concerned about discomfort or pain, you may want to talk to the neurologist about taking a short break during the exam.  Instructions. During the needle EMG, the neurologist will assess whether there is any spontaneous electrical activity when the muscle is at rest - activity that isn't present in healthy muscle tissue - and the degree of activity when you slightly contract the muscle.  He or she will give you instructions on resting and contracting a muscle at appropriate times. Depending on what muscles and nerves the neurologist is examining, he or she may ask you to change positions during the exam.  After your  EMG You may experience some temporary, minor bruising where the needle electrode was inserted into your muscle. This bruising should fade within several days. If it persists, contact your primary care doctor.

## 2021-12-02 ENCOUNTER — Encounter (HOSPITAL_COMMUNITY): Payer: Medicare Other

## 2021-12-03 ENCOUNTER — Telehealth: Payer: Self-pay

## 2021-12-03 NOTE — Telephone Encounter (Signed)
Mri Cervical Spine. No PA required. Need to call patient and provide central scheduling [phone] to call and schedule his imaging.   Called and spoke to patients wife and provided her with Kenneth Pace central scheduling phone number [phone] to get patient scheduled for his MRI.

## 2021-12-05 ENCOUNTER — Ambulatory Visit (HOSPITAL_COMMUNITY)
Admission: RE | Admit: 2021-12-05 | Discharge: 2021-12-05 | Disposition: A | Discharge status: Home / Self Care | Payer: Medicare Other | Source: Ambulatory Visit | Attending: Neurology | Admitting: Neurology

## 2021-12-05 DIAGNOSIS — G6181 Chronic inflammatory demyelinating polyneuritis: Secondary | ICD-10-CM | POA: Diagnosis present

## 2021-12-05 MED ORDER — IMMUNE GLOBULIN (HUMAN) 10 GM/100ML IV SOLN
1.0000 g/kg | INTRAVENOUS | Status: DC
  Administered 2021-12-05: 100 g via INTRAVENOUS
  Filled 2021-12-05: qty 1000

## 2021-12-12 ENCOUNTER — Ambulatory Visit (HOSPITAL_COMMUNITY)
Admission: RE | Admit: 2021-12-12 | Discharge: 2021-12-12 | Disposition: A | Discharge status: Home / Self Care | Payer: Medicare Other | Source: Ambulatory Visit | Attending: Neurology | Admitting: Neurology

## 2021-12-12 DIAGNOSIS — G959 Disease of spinal cord, unspecified: Secondary | ICD-10-CM | POA: Diagnosis present

## 2021-12-12 DIAGNOSIS — G6181 Chronic inflammatory demyelinating polyneuritis: Secondary | ICD-10-CM | POA: Diagnosis present

## 2021-12-12 DIAGNOSIS — R261 Paralytic gait: Secondary | ICD-10-CM | POA: Insufficient documentation

## 2021-12-17 ENCOUNTER — Telehealth: Payer: Self-pay | Admitting: Neurology

## 2021-12-17 NOTE — Telephone Encounter (Signed)
Called patient and left a message for a call back.  

## 2021-12-17 NOTE — Telephone Encounter (Signed)
Please let pt know that MRI cervical spine shows improved stenosis at the level of his prior surgery, but there is moderate stenosis at the level below his prior surgery at C6-7.  I would like for him to follow-up with Dr. Kathyrn Sheriff to review these findings and see what he recommends.

## 2021-12-19 NOTE — Telephone Encounter (Signed)
Called patient and informed him of results and recommendations from Dr. Posey Pronto. Patient will reach out to Dr. Kathyrn Sheriff.

## 2022-01-06 ENCOUNTER — Ambulatory Visit (INDEPENDENT_AMBULATORY_CARE_PROVIDER_SITE_OTHER): Payer: Medicare Other | Admitting: Neurology

## 2022-01-06 DIAGNOSIS — G6181 Chronic inflammatory demyelinating polyneuritis: Secondary | ICD-10-CM

## 2022-01-06 DIAGNOSIS — R261 Paralytic gait: Secondary | ICD-10-CM

## 2022-01-06 DIAGNOSIS — G959 Disease of spinal cord, unspecified: Secondary | ICD-10-CM

## 2022-01-07 NOTE — Procedures (Signed)
[facility]  [place], [place]  [place], [place] [address] Tel: [phone] Fax:  [phone] Test Date:  01/06/2022  Patient: Kenneth Pace DOB: 07/28/1950 Physician: Narda Amber, DO  Sex: Male Height: 6' " Ref Phys: Narda Amber, DO  ID#: [id number]   Technician:    Patient Complaints: This is a 71 year old man with chronic inflammatory demyelinating polyradiculoneuropathy referred for evaluation of progressive weakness and gait instability.  NCV & EMG Findings: Extensive electrodiagnostic testing of the right upper and lower extremity shows:  Right median and ulnar sensory responses shows prolonged distal peak latency (R5.7, R3.5 ms) and reduced amplitude (R8.6, R4.2 V).  Right radial, sural, and superficial peroneal sensory responses are within normal limits. Right median and ulnar motor responses show prolonged latency (R6.3, R3.4 ms), reduced amplitude (R4.2, R3.5 mV) and slowed conduction velocity (43 - 48 m/s).  Right peroneal (EDB) and tibial (AH) motor responses show reduced amplitude (R1.5, R 3.5 mV).  Right peroneal motor response at the tibialis anterior is within normal limits.  Right tibial H-reflex is prolonged.  In the right upper extremity, chronic motor axon loss changes are seen affecting the C5 - T1 myotomes.  There is no active denervation. In the right lower extremity, chronic motor axon loss changes are seen affecting the L3-4 myotomes.  Impression: This is a complex study.  Electrodiagnostic testing is most suggestive of a chronic polyradiculoneuropathy affecting the right side, which has progressed compared to prior study on 04/27/2017.  Additionally, there superimposed chronic C5, C6, and C7 radiculopathy affecting the right upper extremity and L3-4 radiculopathy affecting the right lower extremity, which is new.    ___________________________ Narda Amber, DO    Nerve Conduction Studies Anti Sensory Summary Table   Stim Site  NR Peak (ms) Norm Peak (ms) O-P Amp (V) Norm O-P Amp  Right Median Anti Sensory (2nd Digit)  32C  Wrist    5.7 <3.8 8.6 >10  Right Radial Anti Sensory (Base 1st Digit)  32C  Wrist    2.6 <2.8 14.0 >10  Right Sup Peroneal Anti Sensory (Ant Lat Mall)  32C  12 cm    3.0 <4.6 3.9 >3  Right Sural Anti Sensory (Lat Mall)  32C  Calf    2.3 <4.6 6.3 >3  Right Ulnar Anti Sensory (5th Digit)  32C  Wrist    3.5 <3.2 4.2 >5   Motor Summary Table   Stim Site NR Onset (ms) Norm Onset (ms) O-P Amp (mV) Norm O-P Amp Site1 Site2 Delta-0 (ms) Dist (cm) Vel (m/s) Norm Vel (m/s)  Right Median Motor (Abd Poll Brev)  32C  Wrist    6.3 <4.0 4.2 >5 Elbow Wrist 6.0 29.0 48 >50  Elbow    12.3  4.1         Right Peroneal Motor (Ext Dig Brev)  32C  Ankle    3.3 <6.0 1.5 >2.5 B Fib Ankle 10.3 41.0 40 >40  B Fib    13.6  1.3  Poplt B Fib 2.0 10.0 50 >40  Poplt    15.6  1.3         Right Peroneal TA Motor (Tib Ant)  32C  Fib Head    3.6 <4.5 3.4 >3 Poplit Fib Head 1.8 10.0 56 >40  Poplit    5.4  3.3         Right Tibial Motor (Abd Hall Brev)  32C  Ankle    4.0 <6.0 3.5 >4 Knee Ankle 10.1  43.0 43 >40  Knee    14.1  2.7         Right Ulnar Motor (Abd Dig Minimi)  32C  Wrist    3.4 <3.1 5.3 >7 B Elbow Wrist 5.1 23.0 45 >50  B Elbow    8.5  4.8  A Elbow B Elbow 2.3 10.0 43 >50  A Elbow    10.8  4.8          H Reflex Studies   NR H-Lat (ms) Lat Norm (ms) L-R H-Lat (ms)  Right Tibial (Gastroc)  32C     40.00 <35    EMG   Side Muscle Ins Act Fibs Fasc Recrt Dur. Amp. Poly. Activation Comment  Right AntTibialis Nml Nml Nml Nml Nml Nml Nml Nml N/A  Right Gastroc Nml Nml Nml Nml Nml Nml Nml Nml N/A  Right Flex Dig Long Nml Nml Nml Nml Nml Nml Nml Nml N/A  Right RectFemoris Nml Nml Nml 1- 1+ 1+ 1+ Nml N/A  Right GluteusMed Nml Nml Nml Nml Nml Nml Nml Nml N/A  Right AdductorLong Nml Nml Nml 1- 1+ 1+ 1+ Nml N/A  Right 1stDorInt Nml Nml Nml SMU 2+ 2+ 2+ Nml ATR  Right ABD Dig Min Nml Nml Nml SMU 2+  2+ 2+ Nml ATR  Right Ext Indicis Nml Nml Nml Nml Nml Nml Nml Nml N/A  Right Abd Poll Brev Nml Nml Nml 2- 1+ 1+ 1+ Nml N/A  Right PronatorTeres Nml Nml Nml 2- 2+ 2+ 2+ Nml N/A  Right Biceps Nml Nml Nml 1- 1+ 1+ 1+ Nml N/A  Right Triceps Nml Nml Nml 2- 2+ 2+ 2+ Nml N/A  Right Deltoid Nml Nml Nml 1- 1+ 1+ 1+ Nml N/A      Waveforms:

## 2022-01-12 ENCOUNTER — Telehealth: Payer: Self-pay

## 2022-01-12 ENCOUNTER — Ambulatory Visit (INDEPENDENT_AMBULATORY_CARE_PROVIDER_SITE_OTHER): Payer: Medicare Other | Admitting: Neurology

## 2022-01-12 ENCOUNTER — Encounter: Payer: Self-pay | Admitting: Neurology

## 2022-01-12 VITALS — BP 135/74 | HR 84 | Ht 72.0 in | Wt 240.0 lb

## 2022-01-12 DIAGNOSIS — G959 Disease of spinal cord, unspecified: Secondary | ICD-10-CM | POA: Diagnosis not present

## 2022-01-12 DIAGNOSIS — G6181 Chronic inflammatory demyelinating polyneuritis: Secondary | ICD-10-CM

## 2022-01-12 NOTE — Telephone Encounter (Signed)
Call [facility] infusion to adjust IVIG to 1g/kg every 4 weeks (currently getting it every 6 weeks)

## 2022-01-12 NOTE — Patient Instructions (Signed)
We will adjust your IVIG to every 4 weeks  I will see you back in February

## 2022-01-12 NOTE — Progress Notes (Signed)
Follow-up Visit   Date: 01/12/22   Kenneth Pace MRN: [id number] DOB: May 23, 1950   Interim History: Kenneth Pace is a 71 y.o. right-handed male with insulin-dependent diabetes mellitus, hypertension, cervical myelopathy s/p decompression at C3-C6, and s/p L4 laminectomy (05/2019), and s/p L4-5 PLIF (01/2020) returning to the clinic for follow-up of CIDP.  The patient was accompanied to the clinic by wife.    He is here to discuss EMG results which shows progressive polyradiculoneuropathy as well new multilevel radiculopathies involving the cervical (C5, C6, C7) and lumbar (L3, L4) segments.   MRI cervical spine whos moderate spinal canal stenosis at C3-4 and C6-7 as well as multilevel neural foraminal stenosis, severe at C5-6 and C6-7.  He is scheduled to see Dr. Kathyrn Sheriff this afternoon to review MRI cervical spine.    Patient continues to report that he has significant imbalance, falls frequently, hand weakness, and numbness/tingling of the hands and feet.     Medications:  Current Outpatient Medications on File Prior to Visit  Medication Sig Dispense Refill   acetaminophen (TYLENOL) 500 MG tablet Take 500 mg by mouth every 6 (six) hours as needed for mild pain.     amitriptyline (ELAVIL) 75 MG tablet TAKE 1 TABLET BY MOUTH AT  BEDTIME 90 tablet 3   B Complex Vitamins (B-COMPLEX/B-12 PO) Take 1 tablet by mouth at bedtime.     Blood Glucose Monitoring Suppl (ACCU-CHEK GUIDE ME) w/Device KIT Check blood sugars as instructed 1 kit 0   celecoxib (CELEBREX) 200 MG capsule Take 1 capsule (200 mg total) by mouth 2 (two) times daily. 60 capsule 0   clotrimazole-betamethasone (LOTRISONE) cream Apply 1 application topically 2 (two) times daily as needed (apply to feet skin irritation). 45 g 2   co-enzyme Q-10 30 MG capsule Take 30 mg by mouth 3 (three) times daily.     Cranberry 400 MG TABS Take 400 mg by mouth at bedtime.     docusate sodium (COLACE) 100 MG capsule Take 1  capsule (100 mg total) by mouth 2 (two) times daily. 10 capsule 0   DULoxetine (CYMBALTA) 60 MG capsule TAKE 1 CAPSULE BY MOUTH  DAILY 90 capsule 3   finasteride (PROSCAR) 5 MG tablet TAKE 1 TABLET BY MOUTH AT  BEDTIME 90 tablet 3   glucagon 1 MG injection Inject 1 mg into the vein once as needed. 1 each 12   glucose blood (BAYER CONTOUR NEXT TEST) test strip MEDICALLY NECESSARY FOR USE WITH PUMP; Use to check blood sugar 5 times per day and prn; E11.42 300 each 3   Immune Globulin 10% (IMMUNE GLOBULIN 10%) 10G/146m (10,0087m100mL) SOLN Inject 105 g into the vein every 6 (six) weeks. 289.5 mL 10   Insulin Human (INSULIN PUMP) SOLN Inject 120 each into the skin daily. HUMALOG     Insulin Infusion Pump Supplies (PARADIGM RESERVOIR 3ML) MISC 1 Device by Does not apply route every 3 (three) days. 30 each 3   Insulin Infusion Pump Supplies (QUICK-SET INFUSION 43" 9MM) MISC 1 Device by Does not apply route every 3 (three) days. 30 each 3   insulin lispro (HUMALOG) 100 UNIT/ML injection Inject into the skin. Via insulin pump     lidocaine (LIDODERM) 5 % At 7 am and remove at 7 pm. Apply 1 on each side. 180 patch 2   lisinopril-hydrochlorothiazide (ZESTORETIC) 10-12.5 MG tablet TAKE 1 TABLET BY MOUTH AT  BEDTIME 90 tablet 3   methocarbamol (ROBAXIN) 750 MG tablet TAKE 1  TABLET BY MOUTH 3  TIMES DAILY AS NEEDED FOR  MUSCLE SPASM(S) 90 tablet 0   Multiple Vitamin (MULTIVITAMIN WITH MINERALS) TABS tablet Take 1 tablet by mouth at bedtime.     polyethylene glycol (MIRALAX / GLYCOLAX) 17 g packet Take 17 g by mouth daily as needed for mild constipation. 14 each 0   prochlorperazine (COMPAZINE) 5 MG tablet Take 5 mg by mouth every 6 (six) hours as needed for vomiting or nausea.     pseudoephedrine (SUDAFED) 30 MG tablet Take 30 mg by mouth at bedtime as needed for congestion.     rosuvastatin (CRESTOR) 10 MG tablet Take 1 tablet (10 mg total) by mouth daily. 90 tablet 3   scopolamine (TRANSDERM-SCOP) 1  MG/3DAYS Place 1 patch (1.5 mg total) onto the skin every 3 (three) days. (Patient taking differently: Place 1 patch onto the skin every 3 (three) days as needed (nausea with motion sickness (cruise)).) 10 patch 1   traMADol (ULTRAM) 50 MG tablet Take 1 tablet (50 mg total) by mouth 2 (two) times daily as needed. 60 tablet 5   VITAMIN D, CHOLECALCIFEROL, PO Take by mouth.     No current facility-administered medications on file prior to visit.    Allergies: No Known Allergies    Vital Signs:  BP 135/74   Pulse 84   Ht 6' (1.829 m)   Wt 240 lb (108.9 kg)   SpO2 99%   BMI 32.55 kg/m   Neurological Exam: MENTAL STATUS including orientation to time, place, person, recent and remote memory, attention span and concentration, language, and fund of knowledge is normal.  Speech is not dysarthric.  CRANIAL NERVES: Normal conjugate, extra-ocular eye movements in all directions of gaze.  No ptosis. Face is symmetric.  MOTOR:  There is moderate right FDI and ADM atrophy.  No fasciculations or abnormal movements.  No pronator drift.    Right Upper Extremity:    Left Upper Extremity:    Deltoid  5/5   Deltoid  5/5   Biceps  5/5   Biceps  5/5   Triceps  5/5   Triceps  5/5   Wrist extensors  5/5   Wrist extensors  5/5   Wrist flexors  5/5   Wrist flexors  5/5   Finger extensors  5/5   Finger extensors  5/5   Finger flexors  5/5   Finger flexors  5/5   Dorsal interossei  4/5   Dorsal interossei  4/5   Abductor pollicis  5/5   Abductor pollicis  5/5   Tone (Ashworth scale)  0  Tone (Ashworth scale)  0   Right Lower Extremity:    Left Lower Extremity:    Hip flexors  5-/5   Hip flexors  5-/5   Hip extensors  5/5   Hip extensors  5/5   Knee flexors  5/5   Knee flexors  5/5   Knee extensors  5/5   Knee extensors  5/5   Dorsiflexors  5/5   Dorsiflexors  5/5   Plantarflexors  5/5   Plantarflexors  5/5   Toe extensors  5/5   Toe extensors  5/5   Toe flexors  5/5   Toe flexors  5/5   Tone  (Ashworth scale)  1  Tone (Ashworth scale)  1    MSRs:  Right  Left brachioradialis 2+  brachioradialis 2+  biceps 2+  biceps 2+  triceps 2+  triceps 2+  patellar 3+  patellar 3+  ankle jerk 2+  ankle jerk 2+   SENSORY:  Vibration 100% at the knees and MCP.    COORDINATION/GAIT: Gait is slow, wide-based, spastic, assisted with cane   Data: MRI cervical and lumbar spine 11/20/2016: 1. No focal cord signal abnormality or lesion. 2. Multilevel spondylosis of the cervical spine as described. 3. Moderate central and mild bilateral foraminal stenosis at C3-4. 4. Moderate central and bilateral foraminal narrowing at C4-5. 5. Moderate central and severe bilateral foraminal stenosis at C5-6. 6. Moderate foraminal narrowing bilaterally at C6-7 without significant central canal stenosis. 7. Short pedicles in the cervical and lumbar spine contribute to the stenosis. 8. Disc bulging at L2-3 and L3-4 without significant stenosis. 9. Disc bulging and facet hypertrophy at L4-5 with mild subarticular and foraminal stenosis bilaterally. 10. Mild left foraminal narrowing at L5-S1 secondary to asymmetric facet hypertrophy.  MRI lumbar spine wo contrast 05/07/2019:  1. New large right paracentral L4-5 disc extrusion with cranial extension within the subarticular recess above the L4-5 level and impinging the right L4 nerve root. 2. Mild progression of lumbar spondylosis at L3-L4 where there is mild left foraminal stenosis at L3-4. 3. Mild to moderate bilateral foraminal stenosis at L4-L5. 4. No canal stenosis at any level.  CSF 11/20/2016:  R1 W3 P81* G102*  No OCB, VDRL neg Labs 11/20/2016:  SPEP with IFE no M protein, 486, TSH 1.051  Lab Results  Component Value Date   HGBA1C 7.9 (H) 10/17/2021   MRI cervical spine 12/31/2016:   1. No enlargement of the cranial nerves or cervical nerve roots. 2. No acute intracranial abnormality. 3.  Multilevel moderate cervical spinal canal stenosis, worst at C3-4 and C5-6 with associated mild cord deformity but no cord signal change. 4. Moderate to severe neural foraminal stenosis at C3-4, C4-5 and C5-6.  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  When compared to his previous study on 11/10/2016, there is mild interval improvement.  Chronic C6 radiculopathy affecting the right upper extremity, mild in degree electrically.  There is no evidence of a sensorimotor polyneuropathy or lumbosacral radiculopathy affecting the right lower extremity.   NCS/EMG of the right side 01/06/2022: This is a complex study.  Electrodiagnostic testing is most suggestive of a chronic polyradiculoneuropathy affecting the right side, which has progressed compared to prior study on 04/27/2017.   Additionally, there superimposed chronic C5, C6, and C7 radiculopathy affecting the right upper extremity and L3-4 radiculopathy affecting the right lower extremity, which is new.     IMPRESSION/PLAN: 1.  Chronic inflammatory demyelinating polyradiculoneuropathy, diagnosed 2018, manifesting with bilateral hand paresthesias and weakness.  Repeat NCS/EMG shows progressed neuropathy affecting the arm and leg.  Given this, I will increase frequency of IVIG back to 1g/kg every 4 weeks.  Consider adding azathioprine or Cellcept going forward.  2.  Multilevel cervical radiculopathies worse at C5-6 and C6-7 could also contribute to his hand paresthesias.  He has history of cervical myelopathy s/p decompression at C3-6, however repeat MRI cervical spine shows adjacent disease with spinal canal stenosis at C3-4.  He will be seeing Dr. Kathyrn Sheriff later today to review these findings and discuss options.   3.  Spastic gait, frequent falls due to cervical myelopathy.  He is followed by  PM&R.   Return to clinic in 2 months   Thank you for allowing me to participate in patient's care.  If I can answer any additional questions, I would be pleased to do so.    Sincerely,    Donika K. Posey Pronto, DO

## 2022-01-13 ENCOUNTER — Other Ambulatory Visit (HOSPITAL_COMMUNITY): Payer: Self-pay | Admitting: *Deleted

## 2022-01-13 NOTE — Telephone Encounter (Signed)
Called [facility] [phone] and spoke to [place]. Informed her that Dr. Posey Pronto would like to adjust patients infusion to IVIG 1g/kg every 4 weeks.

## 2022-01-16 ENCOUNTER — Encounter (HOSPITAL_COMMUNITY)
Admission: RE | Admit: 2022-01-16 | Discharge: 2022-01-16 | Disposition: A | Discharge status: Home / Self Care | Payer: Medicare Other | Source: Ambulatory Visit | Attending: Neurology | Admitting: Neurology

## 2022-01-16 DIAGNOSIS — G6181 Chronic inflammatory demyelinating polyneuritis: Secondary | ICD-10-CM | POA: Diagnosis not present

## 2022-01-16 MED ORDER — IMMUNE GLOBULIN (HUMAN) 10 GM/100ML IV SOLN
1.0000 g/kg | INTRAVENOUS | Status: DC
  Administered 2022-01-16: 110 g via INTRAVENOUS
  Filled 2022-01-16: qty 1100

## 2022-02-02 ENCOUNTER — Telehealth: Payer: Self-pay

## 2022-02-02 NOTE — Telephone Encounter (Signed)
Patient received all the supplies for 780 pump and needs assistance to get it connected. States he has been trying to get in touch with Vaughan Basta.

## 2022-02-03 NOTE — Telephone Encounter (Signed)
Scheduled him 1 week before seeing you

## 2022-02-11 ENCOUNTER — Telehealth: Payer: Self-pay

## 2022-02-11 NOTE — Telephone Encounter (Signed)
[employer] requested last chart notes be sent to [phone] for supplies. Chart notes sent

## 2022-02-11 NOTE — Telephone Encounter (Signed)
Error

## 2022-02-13 ENCOUNTER — Encounter (HOSPITAL_COMMUNITY)
Admission: RE | Admit: 2022-02-13 | Discharge: 2022-02-13 | Disposition: A | Discharge status: Home / Self Care | Payer: Medicare Other | Source: Ambulatory Visit | Attending: Neurology | Admitting: Neurology

## 2022-02-13 DIAGNOSIS — G6181 Chronic inflammatory demyelinating polyneuritis: Secondary | ICD-10-CM | POA: Diagnosis not present

## 2022-02-13 MED ORDER — IMMUNE GLOBULIN (HUMAN) 10 GM/100ML IV SOLN
1.0000 g/kg | INTRAVENOUS | Status: DC
  Administered 2022-02-13: 110 g via INTRAVENOUS
  Filled 2022-02-13: qty 1100

## 2022-02-18 ENCOUNTER — Encounter: Payer: Medicare Other | Attending: Endocrinology | Admitting: Nutrition

## 2022-02-18 ENCOUNTER — Other Ambulatory Visit (INDEPENDENT_AMBULATORY_CARE_PROVIDER_SITE_OTHER): Payer: Medicare Other

## 2022-02-18 DIAGNOSIS — E1065 Type 1 diabetes mellitus with hyperglycemia: Secondary | ICD-10-CM | POA: Diagnosis not present

## 2022-02-18 DIAGNOSIS — E785 Hyperlipidemia, unspecified: Secondary | ICD-10-CM | POA: Diagnosis not present

## 2022-02-18 DIAGNOSIS — E119 Type 2 diabetes mellitus without complications: Secondary | ICD-10-CM | POA: Insufficient documentation

## 2022-02-18 LAB — COMPREHENSIVE METABOLIC PANEL
ALT: 16 U/L (ref 0–53)
AST: 23 U/L (ref 0–37)
Albumin: 3.8 g/dL (ref 3.5–5.2)
Alkaline Phosphatase: 215 U/L — ABNORMAL HIGH (ref 39–117)
BUN: 19 mg/dL (ref 6–23)
CO2: 29 mEq/L (ref 19–32)
Calcium: 9.1 mg/dL (ref 8.4–10.5)
Chloride: 101 mEq/L (ref 96–112)
Creatinine, Ser: 0.83 mg/dL (ref 0.40–1.50)
GFR: 89.28 mL/min (ref >60.00)
Glucose, Bld: 95 mg/dL (ref 70–99)
Potassium: 3.9 mEq/L (ref 3.5–5.1)
Sodium: 137 mEq/L (ref 135–145)
Total Bilirubin: 0.4 mg/dL (ref 0.2–1.2)
Total Protein: 8.4 g/dL — ABNORMAL HIGH (ref 6.0–8.3)

## 2022-02-18 LAB — LIPID PANEL
Cholesterol: 120 mg/dL (ref 0–200)
HDL: 33.8 mg/dL — ABNORMAL LOW (ref >39.00)
LDL Cholesterol: 63 mg/dL (ref 0–99)
NonHDL: 86.05
Total CHOL/HDL Ratio: 4
Triglycerides: 114 mg/dL (ref 0.0–149.0)
VLDL: 22.8 mg/dL (ref 0.0–40.0)

## 2022-02-18 LAB — HEMOGLOBIN A1C: Hgb A1c MFr Bld: 8.3 % — ABNORMAL HIGH (ref 4.6–6.5)

## 2022-02-18 NOTE — Progress Notes (Signed)
Patient is here with his wife to learn how to use the [employer]onic 780 G pump.  Settings were transferred into pump from his old pump.  He had 3 basal rates programs, and the settings were transferred from the program that was running.  Patient said that this was the one he used most of the time.  B asal rate: MN: 2.6u/hr, 6AM: 4.0, ISF: 40, target: 110-120, timing 2 hours, I/C: 4.   Patient could not download the [employer] app, due to site being down, he will do this tomorrow and I will talk him through linking to his Pump.   His sensor was linked to the pump and a new sensor-G4 was started.  He was shown how to bolus and he did this correctly without any questions. He filled a cartridge and started the pump at 11AM.  He had no final questions.

## 2022-02-18 NOTE — Patient Instructions (Addendum)
Call when you get the [employer] app downloaded to your phone. Read over owner's manual Call [employer] help line if questions about pump usage

## 2022-02-23 ENCOUNTER — Other Ambulatory Visit: Payer: Medicare Other

## 2022-02-24 ENCOUNTER — Telehealth: Payer: Self-pay | Admitting: Nutrition

## 2022-02-24 NOTE — Telephone Encounter (Signed)
User name: [email or web address] Password: [email or web address]

## 2022-02-25 ENCOUNTER — Telehealth: Payer: Self-pay | Admitting: Nutrition

## 2022-02-25 ENCOUNTER — Encounter: Payer: Medicare Other | Admitting: Nutrition

## 2022-02-25 ENCOUNTER — Ambulatory Visit: Payer: Medicare Other | Admitting: Endocrinology

## 2022-02-25 DIAGNOSIS — E1165 Type 2 diabetes mellitus with hyperglycemia: Secondary | ICD-10-CM

## 2022-02-25 NOTE — Telephone Encounter (Signed)
Patient was given a vial of Lyumjev insulin and would like to continue this.  Says his blood sugars are "so much better".   Please call in the insulin to [facility] in [place]. He is using 11 vials/month.  Thank you

## 2022-02-25 NOTE — Patient Instructions (Signed)
Call [employer] to get user name and password.  Call office with this for me:  [phone] Call [employer] if questions on how to use the pump or change the sensors.

## 2022-02-25 NOTE — Progress Notes (Signed)
Patient is here with his wife to start the auto mode.  He did not get his [employer] user name and password to download the phone app.  He promised to do this today.  He will me with this today.   We discussed how this will work, and he was put in the auto mode.  He reports having forgotten to take his insulin for his breakfast this AM, and his blood sugar was 226.  We discussed the need to bolus for all meals and snacks, even though this pump will do some of this for him.  We discussed alerts and alarms and what the gray shield means and how to get back into the automode.  He had no final questions. He did not want to change his sensor out now-saying he has 5 more hours of life.   He is very happy with the Luymjev insulin and wants to continue this. Note to Alyisha to order this for him.  He had no final questions.

## 2022-02-27 MED ORDER — LYUMJEV 100 UNIT/ML IJ SOLN: INTRAMUSCULAR | 3 refills | Status: DC

## 2022-02-27 NOTE — Telephone Encounter (Signed)
Rx sent to pharmacy   

## 2022-03-09 ENCOUNTER — Encounter (HOSPITAL_COMMUNITY): Payer: Medicare Other

## 2022-03-11 ENCOUNTER — Ambulatory Visit: Payer: Medicare Other | Admitting: Endocrinology

## 2022-03-12 ENCOUNTER — Encounter: Payer: Self-pay | Admitting: Endocrinology

## 2022-03-12 ENCOUNTER — Ambulatory Visit (INDEPENDENT_AMBULATORY_CARE_PROVIDER_SITE_OTHER): Payer: Medicare Other | Admitting: Endocrinology

## 2022-03-12 VITALS — BP 144/86 | HR 83 | Ht 72.0 in | Wt 238.6 lb

## 2022-03-12 DIAGNOSIS — E1065 Type 1 diabetes mellitus with hyperglycemia: Secondary | ICD-10-CM

## 2022-03-12 DIAGNOSIS — E7800 Pure hypercholesterolemia, unspecified: Secondary | ICD-10-CM | POA: Diagnosis not present

## 2022-03-12 MED ORDER — LYUMJEV 100 UNIT/ML IJ SOLN: INTRAMUSCULAR | 3 refills | Status: DC

## 2022-03-12 NOTE — Progress Notes (Signed)
Patient ID: Kenneth Pace, male   DOB: Jul 09, 1950, 72 y.o.   MRN: [id number]           Reason for Appointment : for Type 1 Diabetes  History of Present Illness          Diagnosis: Type 1 diabetes mellitus, date of diagnosis: 1984        Previous history:   He has been on insulin pump for several years mostly Medtronic A1c range in the past has been 7-8.9  Recent history:   INSULIN regimen: Using Medtronic 780 pump BASAL insulin: 10 PM to 6 AM = 2. 6 and 6 AM-10 PM = 4.0 MEALTIME insulin: 1: 4 carbohydrate coverage, correction 1: 50  His A1c is 8.3 and higher  Current management, blood sugar patterns and problems identified:    He has started the 780 pump on 02/18/2022 With this he appears to be requiring somewhat more insulin Most of this may be related to higher amounts of auto correction boluses which are about 35 units/day compared to a total of 60 units for bolus insulin His wife again states that he is not consistent with his boluses or bolus timings Appears to have frequently late or late boluses  He now says that he is afraid to take a bolus before eating in case he does not finish his food  At breakfast he is frequently eating cereal without milk, sometimes 1-1/2 cups with no protein and frequently not entering the carbohydrates when he is eating this He was given a sample of the Lyumjev insulin and he thinks this works relatively better than Humalog and would like a prescription Overall time in range is about the same as previously No hypoglycemia again  He is not exercising much Has gained a little weight recently  CGM analysis for the last 2 weeks OVERNIGHT blood sugars are relatively higher averaging about 190 and then progressively improving to target levels in the mornings to likely 7 AM HYPERGLYCEMIA is occurring frequently at 8 AM, 10 AM and 10 PM  Most of his hyperglycemia is postprandial which can occur at any meal and will sometimes show significant  early spikes However on an average 2-hour blood sugar readings are not much higher compared to Premeal average which continues to be high as before  No hypoglycemia  CGM use % of time   2-week average/GV 156+/-39  Time in range    74    %  % Time Above 180 24+2  % Time above 250   % Time Below 70      PRE-MEAL Fasting Lunch Dinner Bedtime Overall  Glucose range:       Averages: 179 170 185 201/153    POST-MEAL PC Breakfast PC Lunch PC Dinner  Glucose range:     Averages: 173  138 183   Previously  CGM use % of time 77  2-week average/GV   Time in range  71      %  % Time Above 180 25  % Time above 250 3  % Time Below 70 1     PRE-MEAL Fasting Lunch Dinner Bedtime Overall  Glucose range:       Averages: 190 192 206     POST-MEAL PC Breakfast PC Lunch PC Dinner  Glucose range:     Averages: 136 135 169    Hypoglycemia:  occurs infrequently Factors causing hyperglycemia: Symptoms of hypoglycemia: Frequently none Treatment of hypoglycemia: Glucose tablets, juice, 5 dose hard to do  glucagon                 Exercise: Mostly yard work, not able to do a lot of physical activity          Wt Readings from Last 3 Encounters:  03/12/22 238 lb 9.6 oz (108.2 kg)  02/13/22 235 lb (106.6 kg)  01/16/22 232 lb (105.2 kg)    Diabetes labs:  Lab Results  Component Value Date   HGBA1C 8.3 (H) 02/18/2022   HGBA1C 7.9 (H) 10/17/2021   HGBA1C 7.2 (A) 06/19/2021   Lab Results  Component Value Date   MICROALBUR 1.2 10/17/2021   LDLCALC 63 02/18/2022   CREATININE 0.83 02/18/2022    Lab Results  Component Value Date   MICRALBCREAT 0.9 10/17/2021   MICRALBCREAT 1.0 03/18/2016     Allergies as of 03/12/2022   No Known Allergies      Medication List        Accurate as of March 12, 2022  4:13 PM. If you have any questions, ask your nurse or doctor.          STOP taking these medications    insulin lispro 100 UNIT/ML injection Commonly known as:  HUMALOG Stopped by: Elayne Snare, MD       TAKE these medications    Accu-Chek Guide Me w/Device Kit Check blood sugars as instructed   acetaminophen 500 MG tablet Commonly known as: TYLENOL Take 500 mg by mouth every 6 (six) hours as needed for mild pain.   amitriptyline 75 MG tablet Commonly known as: ELAVIL TAKE 1 TABLET BY MOUTH AT  BEDTIME   B-COMPLEX/B-12 PO Take 1 tablet by mouth at bedtime.   celecoxib 200 MG capsule Commonly known as: CELEBREX Take 1 capsule (200 mg total) by mouth 2 (two) times daily.   clotrimazole-betamethasone cream Commonly known as: LOTRISONE Apply 1 application topically 2 (two) times daily as needed (apply to feet skin irritation).   co-enzyme Q-10 30 MG capsule Take 30 mg by mouth 3 (three) times daily.   Cranberry 400 MG Tabs Take 400 mg by mouth at bedtime.   docusate sodium 100 MG capsule Commonly known as: COLACE Take 1 capsule (100 mg total) by mouth 2 (two) times daily.   DULoxetine 60 MG capsule Commonly known as: CYMBALTA TAKE 1 CAPSULE BY MOUTH  DAILY   finasteride 5 MG tablet Commonly known as: PROSCAR TAKE 1 TABLET BY MOUTH AT  BEDTIME   glucagon 1 MG injection Inject 1 mg into the vein once as needed.   glucose blood test strip Commonly known as: Visual merchandiser Next Test MEDICALLY NECESSARY FOR USE WITH PUMP; Use to check blood sugar 5 times per day and prn; E11.42   Immune Globulin 10% 10G/170m (10,'000mg'$ /1058m Soln Generic drug: Immune Globulin 10% Inject 105 g into the vein every 6 (six) weeks.   insulin pump Soln Inject 120 each into the skin daily. HUMALOG   lidocaine 5 % Commonly known as: LIDODERM At 7 am and remove at 7 pm. Apply 1 on each side.   lisinopril-hydrochlorothiazide 10-12.5 MG tablet Commonly known as: ZESTORETIC TAKE 1 TABLET BY MOUTH AT  BEDTIME   Lyumjev 100 UNIT/ML Soln Generic drug: Insulin Lispro-aabc usual dose 132 units daily in pump What changed: additional  instructions Changed by: AjElayne SnareMD   methocarbamol 750 MG tablet Commonly known as: ROBAXIN TAKE 1 TABLET BY MOUTH 3  TIMES DAILY AS NEEDED FOR  MUSCLE SPASM(S)   multivitamin with minerals  Tabs tablet Take 1 tablet by mouth at bedtime.   PARADIGM RESERVOIR 3ML Misc 1 Device by Does not apply route every 3 (three) days.   Quick-set Infusion 43" 20m Misc 1 Device by Does not apply route every 3 (three) days.   polyethylene glycol 17 g packet Commonly known as: MIRALAX / GLYCOLAX Take 17 g by mouth daily as needed for mild constipation.   prochlorperazine 5 MG tablet Commonly known as: COMPAZINE Take 5 mg by mouth every 6 (six) hours as needed for vomiting or nausea.   pseudoephedrine 30 MG tablet Commonly known as: SUDAFED Take 30 mg by mouth at bedtime as needed for congestion.   rosuvastatin 10 MG tablet Commonly known as: Crestor Take 1 tablet (10 mg total) by mouth daily.   scopolamine 1 MG/3DAYS Commonly known as: TRANSDERM-SCOP Place 1 patch (1.5 mg total) onto the skin every 3 (three) days. What changed:  when to take this reasons to take this   traMADol 50 MG tablet Commonly known as: ULTRAM Take 1 tablet (50 mg total) by mouth 2 (two) times daily as needed.   VITAMIN D (CHOLECALCIFEROL) PO Take by mouth.        Allergies: No Known Allergies  Past Medical History:  Diagnosis Date   ADENOIDECTOMY, HX OF 02/16/2007   Anxiety    pt. states he does not have anxiety.   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   DIABETES MELLITUS, TYPE I, UNCONTROLLED 12/29/2006   ERECTILE DYSFUNCTION 02/16/2007   Hearing loss    wears hearing aids   HYPERLIPIDEMIA 02/16/2007   HYPERTENSION 02/16/2007   Nerve pain    per patient "on lower back and both legs"   Sleep apnea    uses CPAP   TESTICULAR MASS, LEFT 02/16/2007   Therapeutic opioid-induced constipation  ([facility])     Past Surgical History:  Procedure Laterality Date   ANTERIOR CERVICAL DECOMP/DISCECTOMY FUSION N/A 02/01/2017   Procedure: ANTERIOR CERVICAL DECOMPRESSION/DISCECTOMY FUSION CERVICAL THREE-FOUR , CERVICAL FOUR-FIVE  CERVICAL FIVE-SIX;  Surgeon: NConsuella Lose MD;  [facility]  Service: Neurosurgery;  Laterality: N/A;   APPENDECTOMY     BACK SURGERY     COLONOSCOPY  02/05/2010   [facility]   EYE SURGERY Right    Dr. GKaty Fitch  KNEE SURGERY     2 Lknee arthroscopy, 3 R knee arthroscpoy   KNEE SURGERY Right 11/12/2016   LUMBAR FUSION  03/26/2020   LUMBAR LAMINECTOMY/DECOMPRESSION MICRODISCECTOMY Right 05/22/2019   Procedure: LAMINOTOMY AND MICRODISCECTOMY RIGHT LUMBAR FOUR- LUMBAR FIVE;  Surgeon: NConsuella Lose MD;  [facility]  Service: Neurosurgery;  Laterality: Right;  LAMINOTOMY AND MICRODISCECTOMY RIGHT LUMBAR FOUR- LUMBAR FIVE   LUMBAR LAMINECTOMY/DECOMPRESSION MICRODISCECTOMY Right 02/01/2020   Procedure: REDO MICRODISCECTOMY RT LUMBAR FOUR-FIVE;  Surgeon: NConsuella Lose MD;  [facility]  Service: Neurosurgery;  Laterality: Right;  posterior   TONSILLECTOMY     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 He has never used smokeless tobacco. He reports that he does not drink alcohol and does not use drugs.      Review of Systems      Lipids: His cholesterol is much better with restarting rosuvastatin which he is tolerating  Lab Results  Component  Value Date   CHOL 120 02/18/2022   CHOL 176 10/17/2021   CHOL 165 03/25/2020   Lab Results  Component Value Date   HDL 33.80 (L) 02/18/2022   HDL 37.20 (L) 10/17/2021   HDL 34.70 (L) 03/25/2020   Lab Results  Component Value Date   LDLCALC 63 02/18/2022   LDLCALC 112 (H) 10/17/2021   LDLCALC 102 (H) 03/25/2020   Lab Results  Component Value Date   TRIG 114.0 02/18/2022   TRIG 137.0 10/17/2021   TRIG 144.0 03/25/2020   Lab Results  Component Value Date   CHOLHDL 4 02/18/2022   CHOLHDL 5 10/17/2021   CHOLHDL 5 03/25/2020   Lab Results  Component Value Date   LDLDIRECT 147.0 03/24/2019   LDLDIRECT 66.0 03/18/2016     Last dilated eye exam was in 7/23  Last foot exam 10/22  Urine microalbumin normal in 04/4191  DIABETES COMPLICATIONS: Erectile dysfunction, no history of coronary disease, mild nonproliferative retinopathy  LABS:  No visits with results within 1 Week(s) from this visit.  Latest known visit with results is:  Lab on 02/18/2022  Component Date Value Ref Range Status   Cholesterol 02/18/2022 120  0 - 200 mg/dL Final   ATP III Classification       Desirable:  < 200 mg/dL               Borderline High:  200 - 239 mg/dL          High:  > = 240 mg/dL   Triglycerides 02/18/2022 114.0  0.0 - 149.0 mg/dL Final   Normal:  <150 mg/dLBorderline High:  150 - 199 mg/dL   HDL 02/18/2022 33.80 (L)  >39.00 mg/dL Final   VLDL 02/18/2022 22.8  0.0 - 40.0 mg/dL Final   LDL Cholesterol 02/18/2022 63  0 - 99 mg/dL Final   Total CHOL/HDL Ratio 02/18/2022 4   Final                  Men          Women1/2 Average Risk     3.4          3.3Average Risk          5.0          4.42X Average Risk          9.6          7.13X Average Risk          15.0          11.0                       NonHDL 02/18/2022 86.05   Final   NOTE:  Non-HDL goal should be 30 mg/dL higher than patient's LDL goal (i.e. LDL goal of < 70 mg/dL, would have non-HDL goal of < 100 mg/dL)   Sodium 02/18/2022 137   135 - 145 mEq/L Final   Potassium 02/18/2022 3.9  3.5 -  5.1 mEq/L Final   Chloride 02/18/2022 101  96 - 112 mEq/L Final   CO2 02/18/2022 29  19 - 32 mEq/L Final   Glucose, Bld 02/18/2022 95  70 - 99 mg/dL Final   BUN 02/18/2022 19  6 - 23 mg/dL Final   Creatinine, Ser 02/18/2022 0.83  0.40 - 1.50 mg/dL Final   Total Bilirubin 02/18/2022 0.4  0.2 - 1.2 mg/dL Final   Alkaline Phosphatase 02/18/2022 215 (H)  39 - 117 U/L Final   AST 02/18/2022 23  0 - 37 U/L Final   ALT 02/18/2022 16  0 - 53 U/L Final   Total Protein 02/18/2022 8.4 (H)  6.0 - 8.3 g/dL Final   Albumin 02/18/2022 3.8  3.5 - 5.2 g/dL Final   GFR 02/18/2022 89.28  >60.00 mL/min Final   Calculated using the CKD-EPI Creatinine Equation (2021)   Calcium 02/18/2022 9.1  8.4 - 10.5 mg/dL Final   Hgb A1c MFr Bld 02/18/2022 8.3 (H)  4.6 - 6.5 % Final   Glycemic Control Guidelines for People with Diabetes:Non Diabetic:  <6%Goal of Therapy: <7%Additional Action Suggested:  >8%     Physical Examination:   BP (!) 144/86 (BP Location: Left Arm, Patient Position: Sitting, Cuff Size: Normal)   Pulse 83   Ht 6' (1.829 m)   Wt 238 lb 9.6 oz (108.2 kg)   SpO2 98%   BMI 32.36 kg/m       ASSESSMENT:  Diabetes type 1, on Medtronic 780 insulin pump  A1c 8.3  Problems identified: Timing of bolus not appropriate Missed boluses frequently On occasion he has not entered any carbohydrate especially at breakfast for boluses High insulin requirement Currently requiring relatively large proportion of auto correction boluses instead of manual boluses for meals Time in range is 74% and not much better  Complications: Minimal as above  HYPERLIPIDEMIA:   PLAN:   Start entering carbohydrates with every meal including in the morning Add protein to breakfast and given examples of doing this If he is not sure how much to really eat he can take only part of the bolus before eating and the rest right after finishing No change in pump  settings as yet   Continue ROSUVASTATIN long-term   There are no Patient Instructions on file for this visit.    Elayne Snare 03/12/2022, 4:13 PM    Note: This note was prepared with Dragon voice recognition system technology. Any transcriptional errors that result from this process are unintentional.

## 2022-03-13 ENCOUNTER — Ambulatory Visit (HOSPITAL_COMMUNITY)
Admission: RE | Admit: 2022-03-13 | Discharge: 2022-03-13 | Disposition: A | Discharge status: Home / Self Care | Payer: Medicare Other | Source: Ambulatory Visit | Attending: Neurology | Admitting: Neurology

## 2022-03-13 ENCOUNTER — Encounter: Payer: Self-pay | Admitting: Endocrinology

## 2022-03-13 DIAGNOSIS — G6181 Chronic inflammatory demyelinating polyneuritis: Secondary | ICD-10-CM | POA: Diagnosis present

## 2022-03-13 MED ORDER — IMMUNE GLOBULIN (HUMAN) 10 GM/100ML IV SOLN
1.0000 g/kg | INTRAVENOUS | Status: DC
  Administered 2022-03-13: 110 g via INTRAVENOUS
  Filled 2022-03-13: qty 1100

## 2022-03-17 ENCOUNTER — Ambulatory Visit: Payer: Self-pay | Admitting: Licensed Clinical Social Worker

## 2022-03-17 NOTE — Patient Instructions (Signed)
  It was a pleasure speaking with you today.  Care Coordination provides support specific to your health needs that extend beyond exceptional routine office care you already receive from your primary care doctor.    If you are eligible for standard Care Coordination, there is no cost to you.  The Care Coordination team is made up of the following team members: Registered Nurse Care Guide: disease management, health education, care coordination and complex case management Clinical Social Work: Complex Care Coordination including coordination of level of care needs, mental and behavioral health assessment and recommendations, and connection to long-term mental health support Clinical Pharmacist: medication management, assistance and disease management Community Resource Care Guides: Forensic psychologist Team: dedicated team of scheduling professionals to support patient and clinical team scheduling needs  Please call [phone] if you would like to schedule a phone appointment with one of the team members.   Casimer Lanius, [place] [phone]

## 2022-03-17 NOTE — Patient Outreach (Signed)
  Care Coordination  Initial Visit Note   03/17/2022 Name: Kenneth Pace MRN: [id number] DOB: 01/17/1951  Kenneth Pace is a 72 y.o. year old male who sees Elayne Snare, MD for primary care. I spoke with  Kenneth Pace by phone today.  What matters to the patients health and wellness today?   Patient reports no concerns or needs from Care Coordination team with health and wellness related to physical or mental heath. .  Reports wife is Consulting civil engineer and assist with all needs   Goals Addressed             This Visit's Progress    COMPLETED: Care Coordination Activities No Follow up Required       Care Coordination Interventions: Reviewed Care Coordination Services:Declined          SDOH assessments and interventions completed:  No    Care Coordination Interventions:  No, not indicated   Follow up plan: No further intervention required.   Encounter Outcome:  Pt. Visit Completed   Casimer Lanius, [place] [phone]

## 2022-03-24 ENCOUNTER — Other Ambulatory Visit: Payer: Self-pay

## 2022-03-24 ENCOUNTER — Encounter: Payer: Self-pay | Admitting: Neurology

## 2022-03-24 ENCOUNTER — Other Ambulatory Visit (INDEPENDENT_AMBULATORY_CARE_PROVIDER_SITE_OTHER): Payer: Medicare Other

## 2022-03-24 ENCOUNTER — Ambulatory Visit (INDEPENDENT_AMBULATORY_CARE_PROVIDER_SITE_OTHER): Payer: Medicare Other | Admitting: Neurology

## 2022-03-24 VITALS — BP 147/83 | HR 83 | Ht 72.0 in | Wt 238.0 lb

## 2022-03-24 DIAGNOSIS — G6181 Chronic inflammatory demyelinating polyneuritis: Secondary | ICD-10-CM

## 2022-03-24 DIAGNOSIS — G959 Disease of spinal cord, unspecified: Secondary | ICD-10-CM

## 2022-03-24 DIAGNOSIS — E1065 Type 1 diabetes mellitus with hyperglycemia: Secondary | ICD-10-CM

## 2022-03-24 MED ORDER — MYCOPHENOLATE MOFETIL 500 MG PO TABS: 500.0000 mg | ORAL_TABLET | Freq: Two times a day (BID) | ORAL | 3 refills | Status: DC

## 2022-03-24 NOTE — Progress Notes (Signed)
Follow-up Visit   Date: 03/24/22   ADLAI SINNING MRN: [id number] DOB: 1950-02-18   Interim History: Kenneth Pace is a 72 y.o. right-handed male with insulin-dependent diabetes mellitus, hypertension, cervical myelopathy s/p decompression at C3-C6, and s/p L4 laminectomy (05/2019), and s/p L4-5 PLIF (01/2020) returning to the clinic for follow-up of CIDP.  The patient was accompanied to the clinic by wife.    Since getting IVIG every 4 weeks, he has noticed less numbness and tingling in the right hand and feet.  It tends to continue to be worse in the evening.  He is having new sharp pain involving the left hand at the joint of the middle finger and thumb.  It is worse when he tries to stretch the fingers.  He saw Dr. Kathyrn Sheriff with updated MRI findings of C3-4 and C6-7 as well as multilevel neural foraminal stenosis, severe at C5-6 and C6-7.  He did not feel surgery would be beneficial and recommended conservative therapy.   Patient continues to report that he has significant imbalance and has fallen 1-2 times since his last visit.   Medications:  Current Outpatient Medications on File Prior to Visit  Medication Sig Dispense Refill   acetaminophen (TYLENOL) 500 MG tablet Take 500 mg by mouth every 6 (six) hours as needed for mild pain.     amitriptyline (ELAVIL) 75 MG tablet TAKE 1 TABLET BY MOUTH AT  BEDTIME 90 tablet 3   B Complex Vitamins (B-COMPLEX/B-12 PO) Take 1 tablet by mouth at bedtime.     Blood Glucose Monitoring Suppl (ACCU-CHEK GUIDE ME) w/Device KIT Check blood sugars as instructed 1 kit 0   celecoxib (CELEBREX) 200 MG capsule Take 1 capsule (200 mg total) by mouth 2 (two) times daily. 60 capsule 0   clotrimazole-betamethasone (LOTRISONE) cream Apply 1 application topically 2 (two) times daily as needed (apply to feet skin irritation). 45 g 2   co-enzyme Q-10 30 MG capsule Take 30 mg by mouth 3 (three) times daily.     Cranberry 400 MG TABS Take 400 mg by  mouth at bedtime.     docusate sodium (COLACE) 100 MG capsule Take 1 capsule (100 mg total) by mouth 2 (two) times daily. 10 capsule 0   DULoxetine (CYMBALTA) 60 MG capsule TAKE 1 CAPSULE BY MOUTH  DAILY 90 capsule 3   finasteride (PROSCAR) 5 MG tablet TAKE 1 TABLET BY MOUTH AT  BEDTIME 90 tablet 3   glucagon 1 MG injection Inject 1 mg into the vein once as needed. 1 each 12   glucose blood (BAYER CONTOUR NEXT TEST) test strip MEDICALLY NECESSARY FOR USE WITH PUMP; Use to check blood sugar 5 times per day and prn; E11.42 300 each 3   Immune Globulin 10% (IMMUNE GLOBULIN 10%) 10G/122m (10,'000mg'$ /1039m SOLN Inject 105 g into the vein every 6 (six) weeks. 289.5 mL 10   Insulin Human (INSULIN PUMP) SOLN Inject 120 each into the skin daily. HUMALOG     Insulin Infusion Pump Supplies (PARADIGM RESERVOIR 3ML) MISC 1 Device by Does not apply route every 3 (three) days. 30 each 3   Insulin Infusion Pump Supplies (QUICK-SET INFUSION 43" 9MM) MISC 1 Device by Does not apply route every 3 (three) days. 30 each 3   Insulin Lispro-aabc (LYUMJEV) 100 UNIT/ML SOLN usual dose 132 units daily in pump 40 mL 3   lidocaine (LIDODERM) 5 % At 7 am and remove at 7 pm. Apply 1 on each side. 180 patch 2  lisinopril-hydrochlorothiazide (ZESTORETIC) 10-12.5 MG tablet TAKE 1 TABLET BY MOUTH AT  BEDTIME 90 tablet 3   methocarbamol (ROBAXIN) 750 MG tablet TAKE 1 TABLET BY MOUTH 3  TIMES DAILY AS NEEDED FOR  MUSCLE SPASM(S) 90 tablet 0   Multiple Vitamin (MULTIVITAMIN WITH MINERALS) TABS tablet Take 1 tablet by mouth at bedtime.     polyethylene glycol (MIRALAX / GLYCOLAX) 17 g packet Take 17 g by mouth daily as needed for mild constipation. 14 each 0   prochlorperazine (COMPAZINE) 5 MG tablet Take 5 mg by mouth every 6 (six) hours as needed for vomiting or nausea.     pseudoephedrine (SUDAFED) 30 MG tablet Take 30 mg by mouth at bedtime as needed for congestion.     rosuvastatin (CRESTOR) 10 MG tablet Take 1 tablet (10 mg  total) by mouth daily. 90 tablet 3   scopolamine (TRANSDERM-SCOP) 1 MG/3DAYS Place 1 patch (1.5 mg total) onto the skin every 3 (three) days. (Patient taking differently: Place 1 patch onto the skin every 3 (three) days as needed (nausea with motion sickness (cruise)).) 10 patch 1   traMADol (ULTRAM) 50 MG tablet Take 1 tablet (50 mg total) by mouth 2 (two) times daily as needed. 60 tablet 5   VITAMIN D, CHOLECALCIFEROL, PO Take by mouth.     No current facility-administered medications on file prior to visit.    Allergies: No Known Allergies    Vital Signs:  BP (!) 147/83   Pulse 83   Ht 6' (1.829 m)   Wt 238 lb (108 kg)   SpO2 99%   BMI 32.28 kg/m   Neurological Exam: MENTAL STATUS including orientation to time, place, person, recent and remote memory, attention span and concentration, language, and fund of knowledge is normal.  Speech is not dysarthric.  CRANIAL NERVES: Normal conjugate, extra-ocular eye movements in all directions of gaze.  No ptosis. Face is symmetric.  MOTOR:  There is moderate right FDI and ADM atrophy.  No fasciculations or abnormal movements.  No pronator drift.  Exquisite pain over the left 3rd PIP to palpation  Right Upper Extremity:    Left Upper Extremity:    Deltoid  5/5   Deltoid  5/5   Biceps  5/5   Biceps  5/5   Triceps  5/5   Triceps  5/5   Wrist extensors  5/5   Wrist extensors  5/5   Wrist flexors  5/5   Wrist flexors  5/5   Finger extensors  5/5   Finger extensors  5/5   Finger flexors  5/5   Finger flexors  5/5   Dorsal interossei  4/5   Dorsal interossei  4/5   Abductor pollicis  5/5   Abductor pollicis  5/5   Tone (Ashworth scale)  0  Tone (Ashworth scale)  0   Right Lower Extremity:    Left Lower Extremity:    Hip flexors  5-/5   Hip flexors  5-/5   Hip extensors  5/5   Hip extensors  5/5   Knee flexors  5/5   Knee flexors  5/5   Knee extensors  5/5   Knee extensors  5/5   Dorsiflexors  5/5   Dorsiflexors  5/5   Plantarflexors   5/5   Plantarflexors  5/5   Toe extensors  5/5   Toe extensors  5/5   Toe flexors  5/5   Toe flexors  5/5   Tone (Ashworth scale)  1  Tone (Ashworth  scale)  1    MSRs:  Right                                                                 Left brachioradialis 2+  brachioradialis 2+  biceps 2+  biceps 2+  triceps 2+  triceps 2+  patellar 3+  patellar 3+  ankle jerk 2+  ankle jerk 2+   SENSORY:  Vibration 100% at the knees and MCP.  Absent at the ankles.   COORDINATION/GAIT: Gait is slow, wide-based, spastic, assisted with cane   Data: MRI cervical and lumbar spine 11/20/2016: 1. No focal cord signal abnormality or lesion. 2. Multilevel spondylosis of the cervical spine as described. 3. Moderate central and mild bilateral foraminal stenosis at C3-4. 4. Moderate central and bilateral foraminal narrowing at C4-5. 5. Moderate central and severe bilateral foraminal stenosis at C5-6. 6. Moderate foraminal narrowing bilaterally at C6-7 without significant central canal stenosis. 7. Short pedicles in the cervical and lumbar spine contribute to the stenosis. 8. Disc bulging at L2-3 and L3-4 without significant stenosis. 9. Disc bulging and facet hypertrophy at L4-5 with mild subarticular and foraminal stenosis bilaterally. 10. Mild left foraminal narrowing at L5-S1 secondary to asymmetric facet hypertrophy.  MRI lumbar spine wo contrast 05/07/2019:  1. New large right paracentral L4-5 disc extrusion with cranial extension within the subarticular recess above the L4-5 level and impinging the right L4 nerve root. 2. Mild progression of lumbar spondylosis at L3-L4 where there is mild left foraminal stenosis at L3-4. 3. Mild to moderate bilateral foraminal stenosis at L4-L5. 4. No canal stenosis at any level.  CSF 11/20/2016:  R1 W3 P81* G102*  No OCB, VDRL neg Labs 11/20/2016:  SPEP with IFE no M protein, 486, TSH 1.051  Lab Results  Component Value Date   HGBA1C 8.3 (H) 02/18/2022   MRI  cervical spine 12/31/2016:   1. No enlargement of the cranial nerves or cervical nerve roots. 2. No acute intracranial abnormality. 3. Multilevel moderate cervical spinal canal stenosis, worst at C3-4 and C5-6 with associated mild cord deformity but no cord signal change. 4. Moderate to severe neural foraminal stenosis at C3-4, C4-5 and C5-6.  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  When compared to his previous study on 11/10/2016, there is mild interval improvement.  Chronic C6 radiculopathy affecting the right upper extremity, mild in degree electrically.  There is no evidence of a sensorimotor polyneuropathy or lumbosacral radiculopathy affecting the right lower extremity.   NCS/EMG of the right side 01/06/2022: This is a complex study.  Electrodiagnostic testing is most suggestive of a chronic polyradiculoneuropathy affecting the right side, which has progressed compared to prior study on 04/27/2017.   Additionally, there superimposed chronic C5, C6, and C7 radiculopathy affecting the right upper extremity and L3-4 radiculopathy affecting the right lower extremity, which is new.     IMPRESSION/PLAN: 1.  Chronic inflammatory demyelinating polyradiculoneuropathy, diagnosed 2018, manifesting with bilateral hand paresthesias and weakness.  Repeat NCS/EMG from November 2023 shows progressed neuropathy affecting the arm and leg.  He has been on IVIG since diagnosis and slowly tapered to every 6 weeks, however, given the progression of  findings on NCS, IVIG was changed to every 4 weeks.  He has noticed less numbness in the right hand and feet with this change. Start Cellcept '500mg'$  twice daily.  Side effects discussed Continue IVIG 1g/kg every 4 weeks Check CBC and CMP weekly x 4, then monthly x 3, then every 3 months  2.  Multilevel cervical  radiculopathies worse at C5-6 and C6-7 could also contribute to his hand paresthesias.  He has history of cervical myelopathy s/p decompression at C3-6, however repeat MRI cervical spine shows adjacent disease with spinal canal stenosis at C3-4.  He was seen by Dr. Kathyrn Sheriff who did not feel surgery would be beneficial.  3.  Spastic gait, frequent falls due to cervical myelopathy.  He is followed by PM&R.   Return to clinic in 3 months   Thank you for allowing me to participate in patient's care.  If I can answer any additional questions, I would be pleased to do so.    Sincerely,    Jazmine Longshore K. Posey Pronto, DO

## 2022-03-24 NOTE — Patient Instructions (Addendum)
Start Cellcept '500mg'$  twice daily  Check CBC and CMP weekly x 4, then monthly x 3  Continue IVIG  Return to clinic in 3 months

## 2022-03-25 ENCOUNTER — Telehealth: Payer: Self-pay

## 2022-03-25 LAB — COMPREHENSIVE METABOLIC PANEL
ALT: 16 IU/L (ref 0–44)
AST: 22 IU/L (ref 0–40)
Albumin/Globulin Ratio: 1 — ABNORMAL LOW (ref 1.2–2.2)
Albumin: 4.1 g/dL (ref 3.9–4.9)
Alkaline Phosphatase: 253 IU/L — ABNORMAL HIGH (ref 44–121)
BUN/Creatinine Ratio: 18 (ref 10–24)
BUN: 16 mg/dL (ref 8–27)
Bilirubin Total: 0.4 mg/dL (ref 0.0–1.2)
CO2: 24 mmol/L (ref 20–29)
Calcium: 9.2 mg/dL (ref 8.6–10.2)
Chloride: 102 mmol/L (ref 96–106)
Creatinine, Ser: 0.87 mg/dL (ref 0.76–1.27)
Globulin, Total: 4.2 g/dL (ref 1.5–4.5)
Glucose: 138 mg/dL — ABNORMAL HIGH (ref 70–99)
Potassium: 4.8 mmol/L (ref 3.5–5.2)
Sodium: 140 mmol/L (ref 134–144)
Total Protein: 8.3 g/dL (ref 6.0–8.5)
eGFR: 93 mL/min/{1.73_m2} (ref >59)

## 2022-03-25 LAB — CBC
Hematocrit: 47.7 % (ref 37.5–51.0)
Hemoglobin: 15.6 g/dL (ref 13.0–17.7)
MCH: 29.2 pg (ref 26.6–33.0)
MCHC: 32.7 g/dL (ref 31.5–35.7)
MCV: 89 fL (ref 79–97)
Platelets: 212 10*3/uL (ref 150–450)
RBC: 5.34 x10E6/uL (ref 4.14–5.80)
RDW: 13.3 % (ref 11.6–15.4)
WBC: 6.2 10*3/uL (ref 3.4–10.8)

## 2022-03-25 LAB — SPECIMEN STATUS REPORT

## 2022-03-25 NOTE — Telephone Encounter (Signed)
Returned call to [facility] and office is currently closed.

## 2022-03-25 NOTE — Telephone Encounter (Signed)
Please let them know it is for Chronic inflammatory demyelinating polyradiculoneuropathy, not transplant.

## 2022-03-25 NOTE — Telephone Encounter (Signed)
[facility] is calling in asking for clarification on a prescription that was sent over yesterday. Wondering if the medication mycophenolate (CELLCEPT) 500 MG tablet is being prescribed for a transplant reasons.

## 2022-03-26 NOTE — Telephone Encounter (Signed)
Called [facility] Mail service and informed the pharmacist that patients Cellcept is used for  Chronic inflammatory demyelinating polyradiculoneuropathy, not transplant.

## 2022-04-03 LAB — COMPREHENSIVE METABOLIC PANEL
ALT: 17 IU/L (ref 0–44)
AST: 16 IU/L (ref 0–40)
Albumin/Globulin Ratio: 1.1 — ABNORMAL LOW (ref 1.2–2.2)
Albumin: 4 g/dL (ref 3.9–4.9)
Alkaline Phosphatase: 232 IU/L — ABNORMAL HIGH (ref 44–121)
BUN/Creatinine Ratio: 24 (ref 10–24)
BUN: 21 mg/dL (ref 8–27)
Bilirubin Total: 0.3 mg/dL (ref 0.0–1.2)
CO2: 26 mmol/L (ref 20–29)
Calcium: 8.5 mg/dL — ABNORMAL LOW (ref 8.6–10.2)
Chloride: 97 mmol/L (ref 96–106)
Creatinine, Ser: 0.89 mg/dL (ref 0.76–1.27)
Globulin, Total: 3.5 g/dL (ref 1.5–4.5)
Glucose: 229 mg/dL — ABNORMAL HIGH (ref 70–99)
Potassium: 4.1 mmol/L (ref 3.5–5.2)
Sodium: 137 mmol/L (ref 134–144)
Total Protein: 7.5 g/dL (ref 6.0–8.5)
eGFR: 93 mL/min/{1.73_m2} (ref >59)

## 2022-04-10 ENCOUNTER — Encounter (HOSPITAL_COMMUNITY): Payer: Medicare Other

## 2022-04-13 ENCOUNTER — Ambulatory Visit (HOSPITAL_COMMUNITY)
Admission: RE | Admit: 2022-04-13 | Discharge: 2022-04-13 | Disposition: A | Discharge status: Home / Self Care | Payer: Medicare Other | Source: Ambulatory Visit | Attending: Neurology | Admitting: Neurology

## 2022-04-13 DIAGNOSIS — G6181 Chronic inflammatory demyelinating polyneuritis: Secondary | ICD-10-CM | POA: Insufficient documentation

## 2022-04-13 MED ORDER — IMMUNE GLOBULIN (HUMAN) 10 GM/100ML IV SOLN
1.0000 g/kg | INTRAVENOUS | Status: DC
  Administered 2022-04-13: 105 g via INTRAVENOUS
  Filled 2022-04-13: qty 1050

## 2022-04-17 ENCOUNTER — Encounter: Payer: Medicare Other | Attending: Registered Nurse | Admitting: Registered Nurse

## 2022-04-17 ENCOUNTER — Encounter: Payer: Self-pay | Admitting: Registered Nurse

## 2022-04-17 VITALS — BP 132/80 | HR 89 | Ht 72.0 in | Wt 232.8 lb

## 2022-04-17 DIAGNOSIS — M545 Low back pain, unspecified: Secondary | ICD-10-CM | POA: Insufficient documentation

## 2022-04-17 DIAGNOSIS — Z79899 Other long term (current) drug therapy: Secondary | ICD-10-CM

## 2022-04-17 DIAGNOSIS — G894 Chronic pain syndrome: Secondary | ICD-10-CM | POA: Diagnosis not present

## 2022-04-17 DIAGNOSIS — G6181 Chronic inflammatory demyelinating polyneuritis: Secondary | ICD-10-CM | POA: Insufficient documentation

## 2022-04-17 DIAGNOSIS — G8929 Other chronic pain: Secondary | ICD-10-CM | POA: Diagnosis present

## 2022-04-17 DIAGNOSIS — Z5181 Encounter for therapeutic drug level monitoring: Secondary | ICD-10-CM | POA: Diagnosis not present

## 2022-04-17 MED ORDER — TRAMADOL HCL 50 MG PO TABS: 50.0000 mg | ORAL_TABLET | Freq: Two times a day (BID) | ORAL | 5 refills | Status: DC | PRN

## 2022-04-17 NOTE — Progress Notes (Unsigned)
Subjective:    Patient ID: Kenneth Pace, male    DOB: 12/22/1950, 72 y.o.   MRN: [id number]  HPI: Kenneth Pace is a 72 y.o. male who returns for follow up appointment for chronic pain and medication refill. states *** pain is located in  ***. rates pain ***. current exercise regime is walking and performing stretching exercises.    Pain Inventory Average Pain 1 Pain Right Now 2 My pain is dull  In the last 24 hours, has pain interfered with the following? General activity 1 Relation with others 0 Enjoyment of life 3 What TIME of day is your pain at its worst? evening Sleep (in general) Fair  Pain is worse with: bending Pain improves with: rest and medication Relief from Meds: 6  Family History  Problem Relation Age of Onset   Dementia Mother    Diabetes Mellitus I Mother    Hypertension Father        54   Healthy Sister    Rheum arthritis Brother    Cancer Neg Hx    Colon cancer Neg Hx    Esophageal cancer Neg Hx    Stomach cancer Neg Hx    Rectal cancer Neg Hx    Colon polyps Neg Hx    Social History   Socioeconomic History   Marital status: Married    Spouse name: Not on file   Number of children: Not on file   Years of education: Not on file   Highest education level: Not on file  Occupational History   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     Highest level of education:  BS, business admin   Right handed   Two story home   Social Determinants of Health   Financial Resource Strain: Not on file  Food Insecurity: No Food Insecurity (10/16/2021)    Hunger Vital Sign    Worried About Running Out of Food in the Last Year: Never true    Ran Out of Food in the Last Year: Never true  Transportation Needs: No Transportation Needs (10/16/2021)   PRAPARE - Hydrologist (Medical): No    Lack of Transportation (Non-Medical): No  Physical Activity: Not on file  Stress: Not on file  Social Connections: Not on file   Past Surgical History:  Procedure Laterality Date   ANTERIOR CERVICAL DECOMP/DISCECTOMY FUSION N/A 02/01/2017   Procedure: ANTERIOR CERVICAL DECOMPRESSION/DISCECTOMY FUSION CERVICAL THREE-FOUR , CERVICAL FOUR-FIVE  CERVICAL FIVE-SIX;  Surgeon: Consuella Lose, MD;  Location: [facility];  Service: Neurosurgery;  Laterality: N/A;   APPENDECTOMY     BACK SURGERY     COLONOSCOPY  02/05/2010   [facility]   EYE SURGERY Right    Dr. Katy Fitch   KNEE SURGERY     2 Lknee arthroscopy, 3 R knee arthroscpoy   KNEE SURGERY Right 11/12/2016   LUMBAR FUSION  03/26/2020   LUMBAR LAMINECTOMY/DECOMPRESSION MICRODISCECTOMY Right 05/22/2019   Procedure: LAMINOTOMY AND MICRODISCECTOMY RIGHT LUMBAR FOUR-  LUMBAR FIVE;  Surgeon: Consuella Lose, MD;  Location: [facility];  Service: Neurosurgery;  Laterality: Right;  LAMINOTOMY AND MICRODISCECTOMY RIGHT LUMBAR FOUR- LUMBAR FIVE   LUMBAR LAMINECTOMY/DECOMPRESSION MICRODISCECTOMY Right 02/01/2020   Procedure: REDO MICRODISCECTOMY RT LUMBAR FOUR-FIVE;  Surgeon: Consuella Lose, MD;  Location: [facility];  Service: Neurosurgery;  Laterality: Right;  posterior   TONSILLECTOMY     TOTAL KNEE ARTHROPLASTY Left 05/06/2021   Procedure: TOTAL KNEE ARTHROPLASTY;  Surgeon: Paralee Cancel, MD;  Location: [facility];  Service: Orthopedics;  Laterality: Left;   Past Surgical History:  Procedure Laterality Date   ANTERIOR CERVICAL DECOMP/DISCECTOMY FUSION N/A 02/01/2017   Procedure: ANTERIOR CERVICAL DECOMPRESSION/DISCECTOMY FUSION CERVICAL THREE-FOUR , CERVICAL FOUR-FIVE  CERVICAL FIVE-SIX;  Surgeon:  Consuella Lose, MD;  Location: [facility];  Service: Neurosurgery;  Laterality: N/A;   APPENDECTOMY     BACK SURGERY     COLONOSCOPY  02/05/2010   [facility]   EYE SURGERY Right    Dr. Katy Fitch   KNEE SURGERY     2 Lknee arthroscopy, 3 R knee arthroscpoy   KNEE SURGERY Right 11/12/2016   LUMBAR FUSION  03/26/2020   LUMBAR LAMINECTOMY/DECOMPRESSION MICRODISCECTOMY Right 05/22/2019   Procedure: LAMINOTOMY AND MICRODISCECTOMY RIGHT LUMBAR FOUR- LUMBAR FIVE;  Surgeon: Consuella Lose, MD;  Location: [facility];  Service: Neurosurgery;  Laterality: Right;  LAMINOTOMY AND MICRODISCECTOMY RIGHT LUMBAR FOUR- LUMBAR FIVE   LUMBAR LAMINECTOMY/DECOMPRESSION MICRODISCECTOMY Right 02/01/2020   Procedure: REDO MICRODISCECTOMY RT LUMBAR FOUR-FIVE;  Surgeon: Consuella Lose, MD;  Location: [facility];  Service: Neurosurgery;  Laterality: Right;  posterior   TONSILLECTOMY     TOTAL KNEE ARTHROPLASTY Left 05/06/2021   Procedure: TOTAL KNEE ARTHROPLASTY;  Surgeon: Paralee Cancel, MD;  Location: [facility];  Service: Orthopedics;  Laterality: Left;   Past Medical History:  Diagnosis Date   ADENOIDECTOMY, HX OF 02/16/2007   Anxiety    pt. states he does not have anxiety.   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[place])    diagnosed 11/2016   DIABETES MELLITUS, TYPE I, UNCONTROLLED 12/29/2006   ERECTILE DYSFUNCTION 02/16/2007   Hearing loss    wears hearing aids   HYPERLIPIDEMIA 02/16/2007   HYPERTENSION 02/16/2007   Nerve pain    per patient "on lower back and both legs"   Sleep apnea    uses CPAP   TESTICULAR MASS, LEFT 02/16/2007   Therapeutic opioid-induced constipation (OIC)    BP 132/80   Pulse 89   Ht 6' (1.829 m)   Wt 232 lb 12.8 oz (105.6 kg)   SpO2 97%   BMI 31.57 kg/m   Opioid Risk Score:   Fall Risk Score:  `1  Depression screen [facility] 2/9     04/17/2022    8:21 AM 10/17/2021    8:32 AM 04/29/2021    8:40 AM 10/29/2020   10:32  AM 10/01/2020    9:57 AM 05/07/2020    8:52 AM 12/10/2017    9:10 AM  Depression screen [facility] 2/9  Decreased Interest 0 0 0 0 0 0 0  Down, Depressed, Hopeless 0 0 0 0 0 0 0  PHQ - 2 Score 0 0 0 0 0 0 0     Review of Systems  Constitutional: Negative.   HENT: Negative.    Eyes: Negative.   Respiratory: Negative.    Cardiovascular: Negative.   Gastrointestinal: Negative.   Endocrine: Negative.   Genitourinary: Negative.   Musculoskeletal:  Positive for arthralgias and gait problem.  Skin: Negative.   Allergic/Immunologic: Negative.   Hematological: Negative.   Psychiatric/Behavioral: Negative.    All other systems reviewed and are negative.      Objective:   Physical Exam        Assessment & Plan:  CIDP: Chronic Inflammatory Demyelinating Polyneuropathy: He is scheduled for IVIG: Neurology Following. Continue to monitor.10/17/2021 Neurologic Gait Disorder: Continue use with assistive device at all times. He's using his cane today. 10/17/2021 3. Chronic Pain Syndrome: Refilled:Tramadol: 50 mg one tablet twice a day as needed for pain #60.Marland Kitchen We will continue the opioid monitoring program, this consists of regular clinic visits, examinations, urine drug screen, pill counts as well as use of [place] Controlled Substance Reporting system. A 12 month History has been reviewed on the [place] on 10/17/2021 Continue elavil continue to monitor.  4. Chronic Low Back Pain without Sciatica: Continue HEP as Tolerated . Continue to Monitor. 10/17/2021 5.Bilateral Knee Pain: L>R: S/P on 05/06/2021 TOTAL KNEE ARTHROPLASTY Left   Continue HEP as Tolerated. Continue to monitor. 10/17/2021   F/U in 6 months

## 2022-04-22 ENCOUNTER — Telehealth: Payer: Self-pay | Admitting: *Deleted

## 2022-04-22 LAB — TOXASSURE SELECT,+ANTIDEPR,UR

## 2022-04-22 NOTE — Telephone Encounter (Signed)
Urine drug screen for this encounter is consistent for prescribed medication 

## 2022-04-24 ENCOUNTER — Ambulatory Visit (INDEPENDENT_AMBULATORY_CARE_PROVIDER_SITE_OTHER): Payer: Medicare Other | Admitting: Family Medicine

## 2022-04-24 ENCOUNTER — Encounter: Payer: Self-pay | Admitting: Family Medicine

## 2022-04-24 VITALS — BP 134/68 | HR 71 | Temp 97.8°F | Ht 72.0 in | Wt 238.1 lb

## 2022-04-24 DIAGNOSIS — I10 Essential (primary) hypertension: Secondary | ICD-10-CM

## 2022-04-24 DIAGNOSIS — N400 Enlarged prostate without lower urinary tract symptoms: Secondary | ICD-10-CM | POA: Diagnosis not present

## 2022-04-24 NOTE — Patient Instructions (Signed)
Keep the diet clean and stay active.  Because your blood pressure is well-controlled, you no longer have to check your blood pressure at home anymore unless you wish. Some people check it twice daily every day and some people stop altogether. Either or anything in between is fine. Strong work!  Let us know if you need anything. 

## 2022-04-24 NOTE — Progress Notes (Signed)
Chief Complaint  Patient presents with   Follow-up    6 month     Subjective Kenneth Pace is a 72 y.o. male who presents for hypertension follow up. He does not monitor home blood pressures. He is compliant with medications- Zestoretic 10-12.5 mg/d. Patient has these side effects of medication: none He is usually adhering to a healthy diet overall. Current exercise: walking, active in yard No Cp or SOB.   BPH Hx of BPH, on finasteride 5 mg/d. Reports compliance, no AE's. Urinating relatively well overall. No bleeding, pain, dc, fevers.    Past Medical History:  Diagnosis Date   ADENOIDECTOMY, HX OF 02/16/2007   Anxiety    pt. states he does not have anxiety.   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[place])    diagnosed 11/2016   DIABETES MELLITUS, TYPE I, UNCONTROLLED 12/29/2006   ERECTILE DYSFUNCTION 02/16/2007   Hearing loss    wears hearing aids   HYPERLIPIDEMIA 02/16/2007   HYPERTENSION 02/16/2007   Nerve pain    per patient "on lower back and both legs"   Sleep apnea    uses CPAP   TESTICULAR MASS, LEFT 02/16/2007   Therapeutic opioid-induced constipation (OIC)     Exam BP 134/68 (BP Location: Left Arm, Patient Position: Sitting, Cuff Size: Large)   Pulse 71   Temp 97.8 F (36.6 C) (Oral)   Ht 6' (1.829 m)   Wt 238 lb 2 oz (108 kg)   SpO2 95%   BMI 32.30 kg/m  General:  well developed, well nourished, in no apparent distress Heart: RRR, no bruits, no LE edema Lungs: clear to auscultation, no accessory muscle use Abd: BS+, S, NT, ND MSK: No cva ttp Psych: well oriented with normal range of affect and appropriate judgment/insight  Essential hypertension  Benign prostatic hyperplasia without lower urinary tract symptoms  Chronic, stable. Cont Zestoretic 10-12.5 mg/d. Counseled on diet and exercise. Chronic, stable. Cont finasteride 5 mg/d.  F/u in 6 mo. The patient voiced  understanding and agreement to the plan.  [place], DO 04/24/22  8:32 AM

## 2022-05-11 ENCOUNTER — Ambulatory Visit (HOSPITAL_COMMUNITY)
Admission: RE | Admit: 2022-05-11 | Discharge: 2022-05-11 | Disposition: A | Discharge status: Home / Self Care | Payer: Medicare Other | Source: Ambulatory Visit | Attending: Neurology | Admitting: Neurology

## 2022-05-11 DIAGNOSIS — G6181 Chronic inflammatory demyelinating polyneuritis: Secondary | ICD-10-CM | POA: Diagnosis not present

## 2022-05-11 MED ORDER — IMMUNE GLOBULIN (HUMAN) 10 GM/100ML IV SOLN
1.0000 g/kg | INTRAVENOUS | Status: DC
  Administered 2022-05-11: 110 g via INTRAVENOUS
  Filled 2022-05-11: qty 1100

## 2022-05-27 ENCOUNTER — Other Ambulatory Visit: Payer: Self-pay | Admitting: Family Medicine

## 2022-06-05 ENCOUNTER — Other Ambulatory Visit (HOSPITAL_COMMUNITY): Payer: Self-pay | Admitting: *Deleted

## 2022-06-08 ENCOUNTER — Ambulatory Visit (HOSPITAL_COMMUNITY)
Admission: RE | Admit: 2022-06-08 | Discharge: 2022-06-08 | Disposition: A | Discharge status: Home / Self Care | Payer: Medicare Other | Source: Ambulatory Visit | Attending: Neurology | Admitting: Neurology

## 2022-06-08 DIAGNOSIS — G6181 Chronic inflammatory demyelinating polyneuritis: Secondary | ICD-10-CM | POA: Insufficient documentation

## 2022-06-08 MED ORDER — IMMUNE GLOBULIN (HUMAN) 10 GM/100ML IV SOLN
1.0000 g/kg | INTRAVENOUS | Status: DC
  Administered 2022-06-08: 110 g via INTRAVENOUS
  Filled 2022-06-08: qty 1100

## 2022-06-11 ENCOUNTER — Encounter: Payer: Self-pay | Admitting: Endocrinology

## 2022-06-11 ENCOUNTER — Ambulatory Visit (INDEPENDENT_AMBULATORY_CARE_PROVIDER_SITE_OTHER): Payer: Medicare Other | Admitting: Endocrinology

## 2022-06-11 VITALS — BP 120/76 | HR 77 | Ht 72.0 in | Wt 234.4 lb

## 2022-06-11 DIAGNOSIS — E1065 Type 1 diabetes mellitus with hyperglycemia: Secondary | ICD-10-CM | POA: Diagnosis not present

## 2022-06-11 DIAGNOSIS — E7800 Pure hypercholesterolemia, unspecified: Secondary | ICD-10-CM

## 2022-06-11 DIAGNOSIS — R600 Localized edema: Secondary | ICD-10-CM

## 2022-06-11 LAB — POCT GLYCOSYLATED HEMOGLOBIN (HGB A1C): Hemoglobin A1C: 7 % — AB (ref 4.0–5.6)

## 2022-06-11 NOTE — Progress Notes (Signed)
Patient ID: Kenneth Pace, male   DOB: February 02, 1951, 72 y.o.   MRN: [id number]           Reason for Appointment : for Type 1 Diabetes  History of Present Illness          Diagnosis: Type 1 diabetes mellitus, date of diagnosis: 1984        Previous history:   He has been on insulin pump for several years mostly Medtronic A1c range in the past has been 7-8.9  Recent history:   INSULIN regimen: Using Medtronic 780 pump BASAL insulin: 10 PM to 6 AM = 2. 6 and 6 AM-10 PM = 4.0 MEALTIME insulin: 1: 4 carbohydrate coverage, correction 1: 50  Recent total insulin dose daily 107 units  His A1c is 7, previously higher  Current management, blood sugar patterns and problems identified:    He has started the 780 pump on 02/18/2022 On his last visit he was not doing boluses at meals or getting the late for boluses which was causing his blood sugars to be significantly high after certain meals He was also told by the nutritionist and diabetes educator to start modifying his diet especially cutting back on cereal in the morning Not eating eggs and toast for breakfast or just dry cereal Currently his bolus and basal insulin amounts are about equal although he is still requiring about 31 units/day in order correction amount Most of his high blood sugars are again related to inadequate boluses although he is trying to bolus before starting to eat Sometimes he will take a second bolus if he has eaten more than he anticipated However on an average his blood sugars are not consistently rising compared to Premeal readings especially in the last week Yesterday his blood sugars were high persistently for about 24 hours despite bolusing and likely started getting better when he changes infusion set Appears to be using the sensor about 85% of the time  CGM analysis for the last 2 weeks OVERNIGHT blood sugars are starting of averaging about 180 at midnight and then progressively decreasing and generally  in a good level between 4 AM-7 AM without hypoglycemia Pre-meal blood sugars are still relatively high at lunch and dinner but not consistent  Postprandial readings are rising variably and as much is 350 okay Has both lunch and dinner No hypoglycemia after meals Time in range is 70% compared to 74 previously   CGM use % of time   2-week average/GV 161/27  Time in range 70       %  % Time Above 180 27+3  % Time above 250   % Time Below 70      PRE-MEAL Fasting Lunch Dinner Bedtime Overall  Glucose range:       Averages: 164 189 204     POST-MEAL PC Breakfast PC Lunch PC Dinner  Glucose range:     Averages: 143 158 190   Prior   CGM use % of time   2-week average/GV 156+/-39  Time in range    74    %  % Time Above 180 24+2  % Time above 250   % Time Below 70      PRE-MEAL Fasting Lunch Dinner Bedtime Overall  Glucose range:       Averages: 179 170 185 201/153    POST-MEAL PC Breakfast PC Lunch PC Dinner  Glucose range:     Averages: 173  138 183    Symptoms of hypoglycemia: Frequently  none Treatment of hypoglycemia: Glucose tablets, juice, 5 dose hard to do glucagon                 Exercise: Mostly yard work, not able to do much physical activity          Wt Readings from Last 3 Encounters:  06/11/22 234 lb 6.4 oz (106.3 kg)  06/08/22 232 lb (105.2 kg)  05/11/22 232 lb (105.2 kg)    Diabetes labs:  Lab Results  Component Value Date   HGBA1C 7.0 (A) 06/11/2022   HGBA1C 8.3 (H) 02/18/2022   HGBA1C 7.9 (H) 10/17/2021   Lab Results  Component Value Date   MICROALBUR 1.2 10/17/2021   LDLCALC 63 02/18/2022   CREATININE 0.89 04/02/2022    Lab Results  Component Value Date   MICRALBCREAT 0.9 10/17/2021   MICRALBCREAT 1.0 03/18/2016     Allergies as of 06/11/2022   No Known Allergies      Medication List        Accurate as of June 11, 2022 11:52 AM. If you have any questions, ask your nurse or doctor.          STOP taking these  medications    Lyumjev 100 UNIT/ML Soln Generic drug: Insulin Lispro-aabc Stopped by: Reather Littler, MD       TAKE these medications    Accu-Chek Guide Me w/Device Kit Check blood sugars as instructed   acetaminophen 500 MG tablet Commonly known as: TYLENOL Take 500 mg by mouth every 6 (six) hours as needed for mild pain.   amitriptyline 75 MG tablet Commonly known as: ELAVIL TAKE 1 TABLET BY MOUTH AT  BEDTIME   B-COMPLEX/B-12 PO Take 1 tablet by mouth at bedtime.   celecoxib 200 MG capsule Commonly known as: CELEBREX Take 1 capsule (200 mg total) by mouth 2 (two) times daily.   clotrimazole-betamethasone cream Commonly known as: LOTRISONE Apply 1 application topically 2 (two) times daily as needed (apply to feet skin irritation).   co-enzyme Q-10 30 MG capsule Take 30 mg by mouth 3 (three) times daily.   Cranberry 400 MG Tabs Take 400 mg by mouth at bedtime.   docusate sodium 100 MG capsule Commonly known as: COLACE Take 1 capsule (100 mg total) by mouth 2 (two) times daily.   DULoxetine 60 MG capsule Commonly known as: CYMBALTA TAKE 1 CAPSULE BY MOUTH  DAILY   finasteride 5 MG tablet Commonly known as: PROSCAR TAKE 1 TABLET BY MOUTH AT  BEDTIME   glucagon 1 MG injection Inject 1 mg into the vein once as needed.   glucose blood test strip Commonly known as: Banker Next Test MEDICALLY NECESSARY FOR USE WITH PUMP; Use to check blood sugar 5 times per day and prn; E11.42   Immune Globulin 10% 10G/18mL (10,000mg /12mL) Soln Generic drug: Immune Globulin 10% Inject 105 g into the vein every 6 (six) weeks.   insulin pump Soln Inject 120 each into the skin daily. HUMALOG   lidocaine 5 % Commonly known as: LIDODERM At 7 am and remove at 7 pm. Apply 1 on each side.   lisinopril-hydrochlorothiazide 10-12.5 MG tablet Commonly known as: ZESTORETIC TAKE 1 TABLET BY MOUTH AT  BEDTIME   methocarbamol 750 MG tablet Commonly known as: ROBAXIN TAKE 1  TABLET BY MOUTH 3 TIMES  DAILY AS NEEDED FOR MUSCLE  SPASM(S)   multivitamin with minerals Tabs tablet Take 1 tablet by mouth at bedtime.   mycophenolate 500 MG tablet Commonly known as:  CellCept Take 1 tablet (500 mg total) by mouth 2 (two) times daily.   PARADIGM RESERVOIR Misc 1 Device by Does not apply route every 3 (three) days.   Quick-set Infusion 43" 9mm Misc 1 Device by Does not apply route every 3 (three) days.   polyethylene glycol 17 g packet Commonly known as: MIRALAX / GLYCOLAX Take 17 g by mouth daily as needed for mild constipation.   prochlorperazine 5 MG tablet Commonly known as: COMPAZINE Take 5 mg by mouth every 6 (six) hours as needed for vomiting or nausea.   pseudoephedrine 30 MG tablet Commonly known as: SUDAFED Take 30 mg by mouth at bedtime as needed for congestion.   rosuvastatin 10 MG tablet Commonly known as: Crestor Take 1 tablet (10 mg total) by mouth daily.   scopolamine 1 MG/3DAYS Commonly known as: TRANSDERM-SCOP Place 1 patch (1.5 mg total) onto the skin every 3 (three) days. What changed:  when to take this reasons to take this   traMADol 50 MG tablet Commonly known as: ULTRAM Take 1 tablet (50 mg total) by mouth 2 (two) times daily as needed.   VITAMIN D (CHOLECALCIFEROL) PO Take by mouth.        Allergies: No Known Allergies  Past Medical History:  Diagnosis Date   ADENOIDECTOMY, HX OF 02/16/2007   Anxiety    pt. states he does not have anxiety.   BENIGN PROSTATIC HYPERTROPHY, WITH OBSTRUCTION 02/03/2010   Chronic inflammatory demyelinating polyneuropathy    diagnosed 11/2016   DIABETES MELLITUS, TYPE I, UNCONTROLLED 12/29/2006   ERECTILE DYSFUNCTION 02/16/2007   Hearing loss    wears hearing aids   HYPERLIPIDEMIA 02/16/2007   HYPERTENSION 02/16/2007   Nerve pain    per patient "on lower back and both legs"   Sleep apnea    uses CPAP   TESTICULAR MASS, LEFT 02/16/2007   Therapeutic opioid-induced  constipation (OIC)     Past Surgical History:  Procedure Laterality Date   ANTERIOR CERVICAL DECOMP/DISCECTOMY FUSION N/A 02/01/2017   Procedure: ANTERIOR CERVICAL DECOMPRESSION/DISCECTOMY FUSION CERVICAL THREE-FOUR , CERVICAL FOUR-FIVE  CERVICAL FIVE-SIX;  Surgeon: Lisbeth Renshaw, MD;  Location: [facility] OR;  Service: Neurosurgery;  Laterality: N/A;   APPENDECTOMY     BACK SURGERY     COLONOSCOPY  02/05/2010   [facility]   EYE SURGERY Right    Dr. Dione Booze   KNEE SURGERY     2 Lknee arthroscopy, 3 R knee arthroscpoy   KNEE SURGERY Right 11/12/2016   LUMBAR FUSION  03/26/2020   LUMBAR LAMINECTOMY/DECOMPRESSION MICRODISCECTOMY Right 05/22/2019   Procedure: LAMINOTOMY AND MICRODISCECTOMY RIGHT LUMBAR FOUR- LUMBAR FIVE;  Surgeon: Lisbeth Renshaw, MD;  Location: [facility] OR;  Service: Neurosurgery;  Laterality: Right;  LAMINOTOMY AND MICRODISCECTOMY RIGHT LUMBAR FOUR- LUMBAR FIVE   LUMBAR LAMINECTOMY/DECOMPRESSION MICRODISCECTOMY Right 02/01/2020   Procedure: REDO MICRODISCECTOMY RT LUMBAR FOUR-FIVE;  Surgeon: Lisbeth Renshaw, MD;  Location: [facility] OR;  Service: Neurosurgery;  Laterality: Right;  posterior   TONSILLECTOMY     TOTAL KNEE ARTHROPLASTY Left 05/06/2021   Procedure: TOTAL KNEE ARTHROPLASTY;  Surgeon: Durene Romans, MD;  Location: [facility] ORS;  Service: Orthopedics;  Laterality: Left;    Family History  Problem Relation Age of Onset   Dementia Mother    Diabetes Mellitus I Mother    Hypertension Father        97   Healthy Sister    Rheum arthritis Brother    Cancer Neg Hx    Colon cancer Neg Hx  Esophageal cancer Neg Hx    Stomach cancer Neg Hx    Rectal cancer Neg Hx    Colon polyps Neg Hx     Social History:  reports that he has never smoked. He has never used smokeless tobacco. He reports that he does not drink alcohol and does not use drugs.      Review of Systems      Lipids: LDL well-controlled with rosuvastatin  HDL low  Lab Results  Component Value Date   CHOL 120  02/18/2022   CHOL 176 10/17/2021   CHOL 165 03/25/2020   Lab Results  Component Value Date   HDL 33.80 (L) 02/18/2022   HDL 37.20 (L) 10/17/2021   HDL 34.70 (L) 03/25/2020   Lab Results  Component Value Date   LDLCALC 63 02/18/2022   LDLCALC 112 (H) 10/17/2021   LDLCALC 102 (H) 03/25/2020   Lab Results  Component Value Date   TRIG 114.0 02/18/2022   TRIG 137.0 10/17/2021   TRIG 144.0 03/25/2020   Lab Results  Component Value Date   CHOLHDL 4 02/18/2022   CHOLHDL 5 10/17/2021   CHOLHDL 5 03/25/2020   Lab Results  Component Value Date   LDLDIRECT 147.0 03/24/2019   LDLDIRECT 66.0 03/18/2016     Last dilated eye exam was in 7/23  Last foot exam 1/24 with Dr. Yates Decamp  Urine microalbumin normal in 10/2021  DIABETES COMPLICATIONS: Erectile dysfunction, no history of coronary disease, mild nonproliferative retinopathy  He is asking about legs swelling  LABS:  Office Visit on 06/11/2022  Component Date Value Ref Range Status   Hemoglobin A1C 06/11/2022 7.0 (A)  4.0 - 5.6 % Final   Lab Results  Component Value Date   HGB 15.6 03/24/2022    Physical Examination:   BP 120/76 (BP Location: Right Arm, Patient Position: Sitting, Cuff Size: Normal)   Pulse 77   Ht 6' (1.829 m)   Wt 234 lb 6.4 oz (106.3 kg)   SpO2 96%   BMI 31.79 kg/m   2+ lower leg edema present with stasis pigmentation    ASSESSMENT:  Diabetes type 1, on Medtronic 780 insulin pump  A1c 7 compared to 8.3  Currently using Humalog insulin Still relatively insulin resistant with total daily insulin over 100 units  Problems identified: Timing of bolus may sometimes be late With larger meals or higher fat meals he is not getting enough boluses with sporadic hyperglycemia after meals Only rarely missing boluses May be having difficulty counting carbohydrates accurately May not be changing his infusion set when he is having persistently high readings and explained by food Time in range is  70% recently Not able to afford Lyumjev which was previously working better for mealtime insulin  His day-to-day management was discussed in detail  PLAN:   He will make sure to enter the bolus with carbs and glucose 10 to 15 minutes before each meal or large snack May adjust further for any higher fat meals Continue to add protein to each breakfast meal  If blood sugars are persistently high for several hours will need to change his infusion set sooner May continue Humalog instead of Lyumjev  He needs to use elastic stockings for his venous edema of the legs, not clear if his Lasix is indicated and he will need to follow-up with PCP also   There are no Patient Instructions on file for this visit.    Reather Littler 06/11/2022, 11:52 AM    Note: This note  was prepared with Dragon voice recognition system technology. Any transcriptional errors that result from this process are unintentional.  

## 2022-06-12 ENCOUNTER — Encounter: Payer: Self-pay | Admitting: Endocrinology

## 2022-06-15 MED FILL — Immune Globulin (Human) IV Soln 20 GM/200ML: INTRAVENOUS | Qty: 200 | Status: AC

## 2022-06-15 MED FILL — Immune Globulin (Human) IV Soln 10 GM/100ML: INTRAVENOUS | Qty: 100 | Status: AC

## 2022-06-15 MED FILL — Immune Globulin (Human) IV Soln 40 GM/400ML: INTRAVENOUS | Qty: 800 | Status: AC

## 2022-06-24 ENCOUNTER — Other Ambulatory Visit (INDEPENDENT_AMBULATORY_CARE_PROVIDER_SITE_OTHER): Payer: Medicare Other

## 2022-06-24 ENCOUNTER — Ambulatory Visit (INDEPENDENT_AMBULATORY_CARE_PROVIDER_SITE_OTHER): Payer: Medicare Other | Admitting: Neurology

## 2022-06-24 ENCOUNTER — Encounter: Payer: Self-pay | Admitting: Neurology

## 2022-06-24 VITALS — BP 136/62 | HR 87 | Ht 72.0 in | Wt 235.4 lb

## 2022-06-24 DIAGNOSIS — G959 Disease of spinal cord, unspecified: Secondary | ICD-10-CM

## 2022-06-24 DIAGNOSIS — G6181 Chronic inflammatory demyelinating polyneuritis: Secondary | ICD-10-CM

## 2022-06-24 LAB — COMPREHENSIVE METABOLIC PANEL
ALT: 20 U/L (ref 0–53)
AST: 22 U/L (ref 0–37)
Albumin: 3.9 g/dL (ref 3.5–5.2)
Alkaline Phosphatase: 183 U/L — ABNORMAL HIGH (ref 39–117)
BUN: 18 mg/dL (ref 6–23)
CO2: 30 mEq/L (ref 19–32)
Calcium: 9.1 mg/dL (ref 8.4–10.5)
Chloride: 99 mEq/L (ref 96–112)
Creatinine, Ser: 0.85 mg/dL (ref 0.40–1.50)
GFR: 88.42 mL/min (ref >60.00)
Glucose, Bld: 247 mg/dL — ABNORMAL HIGH (ref 70–99)
Potassium: 4.1 mEq/L (ref 3.5–5.1)
Sodium: 135 mEq/L (ref 135–145)
Total Bilirubin: 0.4 mg/dL (ref 0.2–1.2)
Total Protein: 8.1 g/dL (ref 6.0–8.3)

## 2022-06-24 LAB — CBC
HCT: 43.4 % (ref 39.0–52.0)
Hemoglobin: 14.5 g/dL (ref 13.0–17.0)
MCHC: 33.5 g/dL (ref 30.0–36.0)
MCV: 87.2 fl (ref 78.0–100.0)
Platelets: 204 10*3/uL (ref 150.0–400.0)
RBC: 4.97 Mil/uL (ref 4.22–5.81)
RDW: 14.3 % (ref 11.5–15.5)
WBC: 6.6 10*3/uL (ref 4.0–10.5)

## 2022-06-24 MED ORDER — MYCOPHENOLATE MOFETIL 500 MG PO TABS: 500.0000 mg | ORAL_TABLET | Freq: Two times a day (BID) | ORAL | 3 refills | Status: DC

## 2022-06-24 NOTE — Patient Instructions (Signed)
Check CBC and CMP today  Continue your medications as you are  I will see you back in 3-4 months

## 2022-06-24 NOTE — Progress Notes (Signed)
Follow-up Visit   Date: 06/24/22   Kenneth Pace MRN: [id number] DOB: 01/27/1951   Interim History: Kenneth Pace is a 72 y.o. right-handed male with insulin-dependent diabetes mellitus, hypertension, cervical myelopathy s/p decompression at C3-C6, and s/p L4 laminectomy (05/2019), and s/p L4-5 PLIF (01/2020) returning to the clinic for follow-up of CIDP.  The patient was accompanied to the clinic by wife.    He has noticed significantly less tingling in the hands, since his last visit.  Today, he reports no painful tingling.  The only change to medications was introduction of Cellcept, which he is tolerating well.  He would like the prescription sent [facility].  He remains on IVIG every 4 weeks.  Patient continues to report that he has significant imbalance and has fallen several times since his last visit, mostly when he is not using the cane.  Medications:  Current Outpatient Medications on File Prior to Visit  Medication Sig Dispense Refill   acetaminophen (TYLENOL) 500 MG tablet Take 500 mg by mouth every 6 (six) hours as needed for mild pain.     amitriptyline (ELAVIL) 75 MG tablet TAKE 1 TABLET BY MOUTH AT  BEDTIME 90 tablet 3   B Complex Vitamins (B-COMPLEX/B-12 PO) Take 1 tablet by mouth at bedtime.     Blood Glucose Monitoring Suppl (ACCU-CHEK GUIDE ME) w/Device KIT Check blood sugars as instructed 1 kit 0   celecoxib (CELEBREX) 200 MG capsule Take 1 capsule (200 mg total) by mouth 2 (two) times daily. 60 capsule 0   clotrimazole-betamethasone (LOTRISONE) cream Apply 1 application topically 2 (two) times daily as needed (apply to feet skin irritation). 45 g 2   co-enzyme Q-10 30 MG capsule Take 30 mg by mouth 3 (three) times daily.     Cranberry 400 MG TABS Take 400 mg by mouth at bedtime.     DULoxetine (CYMBALTA) 60 MG capsule TAKE 1 CAPSULE BY MOUTH  DAILY 90 capsule 3   finasteride (PROSCAR) 5 MG tablet TAKE 1 TABLET BY MOUTH AT  BEDTIME 90  tablet 3   glucagon 1 MG injection Inject 1 mg into the vein once as needed. 1 each 12   glucose blood (BAYER CONTOUR NEXT TEST) test strip MEDICALLY NECESSARY FOR USE WITH PUMP; Use to check blood sugar 5 times per day and prn; E11.42 300 each 3   Immune Globulin 10% (IMMUNE GLOBULIN 10%) 10G/163mL (10,000mg /134mL) SOLN Inject 105 g into the vein every 6 (six) weeks. (Patient taking differently: Inject 1 g/kg into the vein. Every 4 weeks) 289.5 mL 10   Insulin Human (INSULIN PUMP) SOLN Inject 120 each into the skin daily. HUMALOG     Insulin Infusion Pump Supplies (PARADIGM RESERVOIR ) MISC 1 Device by Does not apply route every 3 (three) days. 30 each 3   Insulin Infusion Pump Supplies (QUICK-SET INFUSION 43" ) MISC 1 Device by Does not apply route every 3 (three) days. 30 each 3   lidocaine (LIDODERM) 5 % At 7 am and remove at 7 pm. Apply 1 on each side. 180 patch 2   lisinopril-hydrochlorothiazide (ZESTORETIC) 10-12.5 MG tablet TAKE 1 TABLET BY MOUTH AT  BEDTIME 90 tablet 3   methocarbamol (ROBAXIN) 750 MG tablet TAKE 1 TABLET BY MOUTH 3 TIMES  DAILY AS NEEDED FOR MUSCLE  SPASM(S) 90 tablet 0   Multiple Vitamin (MULTIVITAMIN WITH MINERALS) TABS tablet Take 1 tablet by mouth at bedtime.     mycophenolate (CELLCEPT) 500 MG tablet  Take 1 tablet (500 mg total) by mouth 2 (two) times daily. 180 tablet 3   polyethylene glycol (MIRALAX / GLYCOLAX) 17 g packet Take 17 g by mouth daily as needed for mild constipation. 14 each 0   pseudoephedrine (SUDAFED) 30 MG tablet Take 30 mg by mouth at bedtime as needed for congestion.     rosuvastatin (CRESTOR) 10 MG tablet Take 1 tablet (10 mg total) by mouth daily. 90 tablet 3   scopolamine (TRANSDERM-SCOP) 1 MG/3DAYS Place 1 patch (1.5 mg total) onto the skin every 3 (three) days. (Patient taking differently: Place 1 patch onto the skin every 3 (three) days as needed (nausea with motion sickness (cruise)).) 10 patch 1   traMADol (ULTRAM) 50 MG tablet Take 1  tablet (50 mg total) by mouth 2 (two) times daily as needed. 60 tablet 5   VITAMIN D, CHOLECALCIFEROL, PO Take by mouth.     No current facility-administered medications on file prior to visit.    Allergies: No Known Allergies    Vital Signs:  BP 136/62   Pulse 87   Ht 6' (1.829 m)   Wt 235 lb 6.4 oz (106.8 kg)   SpO2 97%   BMI 31.93 kg/m   Neurological Exam: MENTAL STATUS including orientation to time, place, person, recent and remote memory, attention span and concentration, language, and fund of knowledge is normal.  Speech is not dysarthric.  CRANIAL NERVES: Normal conjugate, extra-ocular eye movements in all directions of gaze.  No ptosis. Face is symmetric.  MOTOR:  There is moderate right FDI and ADM atrophy.  No fasciculations or abnormal movements.  No pronator drift.    Right Upper Extremity:    Left Upper Extremity:    Deltoid  5/5   Deltoid  5/5   Biceps  5/5   Biceps  5/5   Triceps  5/5   Triceps  5/5   Wrist extensors  5/5   Wrist extensors  5/5   Wrist flexors  5/5   Wrist flexors  5/5   Finger extensors  5/5   Finger extensors  5/5   Finger flexors  5/5   Finger flexors  5/5   Dorsal interossei  4/5   Dorsal interossei  4/5   Abductor pollicis  5/5   Abductor pollicis  5/5   Tone (Ashworth scale)  0  Tone (Ashworth scale)  0   Right Lower Extremity:    Left Lower Extremity:    Hip flexors  5-/5   Hip flexors  5-/5   Hip extensors  5/5   Hip extensors  5/5   Knee flexors  5/5   Knee flexors  5/5   Knee extensors  5/5   Knee extensors  5/5   Dorsiflexors  5/5   Dorsiflexors  5/5   Plantarflexors  5/5   Plantarflexors  5/5   Toe extensors  5/5   Toe extensors  5/5   Toe flexors  5/5   Toe flexors  5/5   Tone (Ashworth scale)  1  Tone (Ashworth scale)  1    MSRs:  Right                                                                 Left brachioradialis 2+  brachioradialis 2+  biceps 2+  biceps 2+  triceps 2+  triceps 2+  patellar 3+  patellar 3+   ankle jerk 2+  ankle jerk 2+   SENSORY:  Vibration 100% at the knees and MCP.  Absent at the ankles.   COORDINATION/GAIT: Gait is slow, wide-based, spastic, assisted with cane   Data: MRI cervical and lumbar spine 11/20/2016: 1. No focal cord signal abnormality or lesion. 2. Multilevel spondylosis of the cervical spine as described. 3. Moderate central and mild bilateral foraminal stenosis at C3-4. 4. Moderate central and bilateral foraminal narrowing at C4-5. 5. Moderate central and severe bilateral foraminal stenosis at C5-6. 6. Moderate foraminal narrowing bilaterally at C6-7 without significant central canal stenosis. 7. Short pedicles in the cervical and lumbar spine contribute to the stenosis. 8. Disc bulging at L2-3 and L3-4 without significant stenosis. 9. Disc bulging and facet hypertrophy at L4-5 with mild subarticular and foraminal stenosis bilaterally. 10. Mild left foraminal narrowing at L5-S1 secondary to asymmetric facet hypertrophy.  MRI lumbar spine wo contrast 05/07/2019:  1. New large right paracentral L4-5 disc extrusion with cranial extension within the subarticular recess above the L4-5 level and impinging the right L4 nerve root. 2. Mild progression of lumbar spondylosis at L3-L4 where there is mild left foraminal stenosis at L3-4. 3. Mild to moderate bilateral foraminal stenosis at L4-L5. 4. No canal stenosis at any level.  CSF 11/20/2016:  R1 W3 P81* G102*  No OCB, VDRL neg Labs 11/20/2016:  SPEP with IFE no M protein, 486, TSH 1.051  Lab Results  Component Value Date   HGBA1C 7.0 (A) 06/11/2022   MRI cervical spine 12/31/2016:   1. No enlargement of the cranial nerves or cervical nerve roots. 2. No acute intracranial abnormality. 3. Multilevel moderate cervical spinal canal stenosis, worst at C3-4 and C5-6 with associated mild cord deformity but no cord signal change. 4. Moderate to severe neural foraminal stenosis at C3-4, C4-5 and C5-6.  NCS/EMG of the  right upper and lower extremities 04/27/2017: The electrophysiologic findings are most consistent with an active on chronic polyradiculoneuropathy affecting right upper extremity.  When compared to his previous study on 11/10/2016, there is mild interval improvement.  Chronic C6 radiculopathy affecting the right upper extremity, mild in degree electrically.  There is no evidence of a sensorimotor polyneuropathy or lumbosacral radiculopathy affecting the right lower extremity.   NCS/EMG of the right side 01/06/2022: This is a complex study.  Electrodiagnostic testing is most suggestive of a chronic polyradiculoneuropathy affecting the right side, which has progressed compared to prior study on 04/27/2017.   Additionally, there superimposed chronic C5, C6, and C7 radiculopathy affecting the right upper extremity and L3-4 radiculopathy affecting the right lower extremity, which is new.     IMPRESSION/PLAN: 1.  Chronic inflammatory demyelinating polyradiculoneuropathy, diagnosed 2018, manifesting with bilateral hand paresthesias and weakness.  Repeat NCS/EMG from November 2023 shows progressed neuropathy affecting the arm and leg.  He has been on IVIG since diagnosis and slowly tapered to every 6 weeks, however, given the progression of findings on NCS, IVIG was changed to every 4 weeks, which has reduced his tingling and numbness.  In [place] 2024, Cellcept was started in hopes to taper IVIG. Continue Cellcept 500mg  twice daily.  Check CBC and CMP today. Continue IVIG 1g/kg every 4 weeks  2.  Multilevel cervical radiculopathies worse at C5-6 and C6-7 could also contribute to his hand paresthesias.  He has history of cervical myelopathy s/p decompression at C3-6, however  repeat MRI cervical spine shows adjacent disease with spinal canal stenosis at C3-4.  He was seen by Dr. Conchita Paris who did not feel surgery would be beneficial.  3.  Spastic gait, frequent falls due to cervical myelopathy.  He is  followed by PM&R.   Return to clinic in 3-4 months  Total time spent reviewing records, interview, history/exam, documentation, and coordination of care on day of encounter:  20 min    Thank you for allowing me to participate in patient's care.  If I can answer any additional questions, I would be pleased to do so.    Sincerely,    Juluis Fitzsimmons K. Allena Katz, DO

## 2022-06-25 ENCOUNTER — Telehealth: Payer: Self-pay

## 2022-06-25 NOTE — Telephone Encounter (Signed)
[employer] called asking for order to be addended. Thye need to know how often he teests per day . They need order faxed to [phone] or it can fixed in portal.

## 2022-06-26 NOTE — Telephone Encounter (Signed)
Clinical notes sent do not include how often pt is testing blood sugar. [employer] needs notes to reflect information.

## 2022-07-01 NOTE — Telephone Encounter (Signed)
His insurance requires there to be an amount of times he needs to test in order to fill even if CGM. We need to addend note to reflect that and send to [employer].

## 2022-07-01 NOTE — Telephone Encounter (Signed)
Form filled out and signed and faxed today to [employer].

## 2022-07-06 ENCOUNTER — Ambulatory Visit (HOSPITAL_COMMUNITY)
Admission: RE | Admit: 2022-07-06 | Discharge: 2022-07-06 | Disposition: A | Discharge status: Home / Self Care | Payer: Medicare Other | Source: Ambulatory Visit | Attending: Neurology | Admitting: Neurology

## 2022-07-06 DIAGNOSIS — G6181 Chronic inflammatory demyelinating polyneuritis: Secondary | ICD-10-CM | POA: Diagnosis present

## 2022-07-06 MED ORDER — IMMUNE GLOBULIN (HUMAN) 10 GM/100ML IV SOLN
1.0000 g/kg | INTRAVENOUS | Status: DC
  Administered 2022-07-06: 105 g via INTRAVENOUS
  Filled 2022-07-06: qty 1050

## 2022-07-07 MED FILL — Immune Globulin (Human) IV Soln 40 GM/400ML: INTRAVENOUS | Qty: 800 | Status: AC

## 2022-07-07 MED FILL — Immune Globulin (Human) IV Soln 5 GM/50ML: INTRAVENOUS | Qty: 50 | Status: AC

## 2022-07-07 MED FILL — Immune Globulin (Human) IV Soln 20 GM/200ML: INTRAVENOUS | Qty: 200 | Status: AC

## 2022-08-03 ENCOUNTER — Ambulatory Visit (HOSPITAL_COMMUNITY)
Admission: RE | Admit: 2022-08-03 | Discharge: 2022-08-03 | Disposition: A | Discharge status: Home / Self Care | Payer: Medicare Other | Source: Ambulatory Visit | Attending: Neurology | Admitting: Neurology

## 2022-08-03 DIAGNOSIS — G6181 Chronic inflammatory demyelinating polyneuritis: Secondary | ICD-10-CM | POA: Diagnosis not present

## 2022-08-03 MED ORDER — IMMUNE GLOBULIN (HUMAN) 10 GM/100ML IV SOLN
1.0000 g/kg | INTRAVENOUS | Status: DC
  Administered 2022-08-03: 105 g via INTRAVENOUS
  Filled 2022-08-03: qty 1050

## 2022-08-18 ENCOUNTER — Other Ambulatory Visit: Payer: Self-pay | Admitting: Endocrinology

## 2022-08-31 ENCOUNTER — Inpatient Hospital Stay (HOSPITAL_COMMUNITY): Admission: RE | Admit: 2022-08-31 | Payer: BLUE CROSS/BLUE SHIELD | Source: Ambulatory Visit

## 2022-09-22 ENCOUNTER — Telehealth: Payer: Self-pay

## 2022-09-22 NOTE — Transitions of Care (Post Inpatient/ED Visit) (Signed)
   09/22/2022  Name: Kenneth Pace MRN: [id number] DOB: 25-Feb-1950  Today's TOC FU Call Status: Today's TOC FU Call Status:: Successful TOC FU Call Completed TOC FU Call Complete Date: 09/22/22  Transition Care Management Follow-up Telephone Call Date of Discharge: 10/04/2022 Discharge Facility: Other ([facility]) Name of Other ([facility]) Discharge Facility: [facility] Type of Discharge: Inpatient Admission Primary Inpatient Discharge Diagnosis:: chest pain How have you been since you were released from the hospital?: Worse (deceased) Any questions or concerns?: No  Items Reviewed: Did you receive and understand the discharge instructions provided?: No Medications obtained,verified, and reconciled?: No Any new allergies since your discharge?: No Dietary orders reviewed?: NA  Medications Reviewed Today: Medications Reviewed Today   Medications were not reviewed in this encounter     Home Care and Equipment/Supplies:    Functional Questionnaire:    Follow up appointments reviewed: PCP Follow-up appointment confirmed?: NA (patient is deceased) Specialist Hospital Follow-up appointment confirmed?: NA Do you need transportation to your follow-up appointment?: No Do you understand care options if your condition(s) worsen?: Yes-patient verbalized understanding    SIGNATURE Karena Addison, LPN [facility] Nurse Health Advisor Direct Dial [phone]

## 2022-09-28 ENCOUNTER — Encounter (HOSPITAL_COMMUNITY): Payer: BLUE CROSS/BLUE SHIELD

## 2022-10-07 ENCOUNTER — Ambulatory Visit: Payer: BLUE CROSS/BLUE SHIELD | Admitting: Neurology

## 2022-10-09 ENCOUNTER — Other Ambulatory Visit: Payer: BLUE CROSS/BLUE SHIELD

## 2022-10-13 ENCOUNTER — Ambulatory Visit: Payer: BLUE CROSS/BLUE SHIELD | Admitting: Endocrinology

## 2022-10-18 DEATH — deceased

## 2022-10-20 ENCOUNTER — Ambulatory Visit: Payer: BLUE CROSS/BLUE SHIELD | Admitting: Registered Nurse

## 2022-10-26 ENCOUNTER — Ambulatory Visit: Payer: BLUE CROSS/BLUE SHIELD | Admitting: Family Medicine
